# Patient Record
Sex: Female | Born: 1955
Health system: Southern US, Community
[De-identification: ages and names within clinical notes are randomized; demographics above are authoritative.]

## PROBLEM LIST (undated history)

## (undated) DIAGNOSIS — K635 Polyp of colon: Secondary | ICD-10-CM

## (undated) DIAGNOSIS — I82409 Acute embolism and thrombosis of unspecified deep veins of unspecified lower extremity: Secondary | ICD-10-CM

## (undated) DIAGNOSIS — Z923 Personal history of irradiation: Secondary | ICD-10-CM

## (undated) DIAGNOSIS — C50919 Malignant neoplasm of unspecified site of unspecified female breast: Secondary | ICD-10-CM

## (undated) DIAGNOSIS — N903 Dysplasia of vulva, unspecified: Secondary | ICD-10-CM

## (undated) DIAGNOSIS — G5600 Carpal tunnel syndrome, unspecified upper limb: Secondary | ICD-10-CM

## (undated) DIAGNOSIS — F419 Anxiety disorder, unspecified: Secondary | ICD-10-CM

## (undated) DIAGNOSIS — Z9889 Other specified postprocedural states: Secondary | ICD-10-CM

## (undated) DIAGNOSIS — R002 Palpitations: Secondary | ICD-10-CM

## (undated) DIAGNOSIS — I10 Essential (primary) hypertension: Secondary | ICD-10-CM

## (undated) DIAGNOSIS — I2510 Atherosclerotic heart disease of native coronary artery without angina pectoris: Secondary | ICD-10-CM

## (undated) DIAGNOSIS — F329 Major depressive disorder, single episode, unspecified: Secondary | ICD-10-CM

## (undated) DIAGNOSIS — F32A Depression, unspecified: Secondary | ICD-10-CM

## (undated) DIAGNOSIS — M1990 Unspecified osteoarthritis, unspecified site: Secondary | ICD-10-CM

## (undated) DIAGNOSIS — T4145XA Adverse effect of unspecified anesthetic, initial encounter: Secondary | ICD-10-CM

## (undated) DIAGNOSIS — C519 Malignant neoplasm of vulva, unspecified: Secondary | ICD-10-CM

## (undated) DIAGNOSIS — I350 Nonrheumatic aortic (valve) stenosis: Secondary | ICD-10-CM

## (undated) DIAGNOSIS — I509 Heart failure, unspecified: Secondary | ICD-10-CM

## (undated) DIAGNOSIS — T8859XA Other complications of anesthesia, initial encounter: Secondary | ICD-10-CM

## (undated) DIAGNOSIS — I779 Disorder of arteries and arterioles, unspecified: Secondary | ICD-10-CM

## (undated) HISTORY — DX: Malignant neoplasm of unspecified site of unspecified female breast: C50.919

## (undated) HISTORY — PX: TUBAL LIGATION: SHX77

## (undated) HISTORY — PX: INTRAUTERINE DEVICE INSERTION: SHX323

## (undated) HISTORY — DX: Major depressive disorder, single episode, unspecified: F32.9

## (undated) HISTORY — DX: Depression, unspecified: F32.A

## (undated) HISTORY — PX: DILATION AND CURETTAGE OF UTERUS: SHX78

## (undated) HISTORY — DX: Anxiety disorder, unspecified: F41.9

## (undated) HISTORY — PX: MASTECTOMY PARTIAL / LUMPECTOMY W/ AXILLARY LYMPHADENECTOMY: SUR852

## (undated) HISTORY — PX: CARDIAC CATHETERIZATION: SHX172

## (undated) HISTORY — DX: Personal history of irradiation: Z92.3

## (undated) HISTORY — PX: IUD REMOVAL: SHX5392

## (undated) HISTORY — PX: APPENDECTOMY: SHX54

## (undated) HISTORY — PX: VULVA SURGERY: SHX837

## (undated) HISTORY — DX: Other specified postprocedural states: Z98.890

## (undated) HISTORY — PX: COLONOSCOPY W/ POLYPECTOMY: SHX1380

## (undated) HISTORY — DX: Polyp of colon: K63.5

## (undated) HISTORY — DX: Essential (primary) hypertension: I10

---

## 1998-01-15 ENCOUNTER — Other Ambulatory Visit: Admission: RE | Admit: 1998-01-15 | Discharge: 1998-01-15 | Payer: Self-pay | Admitting: Gynecology

## 1999-09-22 ENCOUNTER — Other Ambulatory Visit: Admission: RE | Admit: 1999-09-22 | Discharge: 1999-09-22 | Payer: Self-pay | Admitting: Gynecology

## 2001-01-25 ENCOUNTER — Other Ambulatory Visit: Admission: RE | Admit: 2001-01-25 | Discharge: 2001-01-25 | Payer: Self-pay | Admitting: Gynecology

## 2002-06-27 ENCOUNTER — Other Ambulatory Visit: Admission: RE | Admit: 2002-06-27 | Discharge: 2002-06-27 | Payer: Self-pay | Admitting: Gynecology

## 2003-07-16 ENCOUNTER — Other Ambulatory Visit: Admission: RE | Admit: 2003-07-16 | Discharge: 2003-07-16 | Payer: Self-pay | Admitting: Gynecology

## 2004-09-11 ENCOUNTER — Other Ambulatory Visit: Admission: RE | Admit: 2004-09-11 | Discharge: 2004-09-11 | Payer: Self-pay | Admitting: Gynecology

## 2005-03-17 ENCOUNTER — Other Ambulatory Visit: Admission: RE | Admit: 2005-03-17 | Discharge: 2005-03-17 | Payer: Self-pay | Admitting: Gynecology

## 2005-09-02 ENCOUNTER — Other Ambulatory Visit: Admission: RE | Admit: 2005-09-02 | Discharge: 2005-09-02 | Payer: Self-pay | Admitting: Gynecology

## 2006-02-09 ENCOUNTER — Ambulatory Visit (HOSPITAL_COMMUNITY): Admission: RE | Admit: 2006-02-09 | Discharge: 2006-02-09 | Payer: Self-pay | Admitting: Gastroenterology

## 2006-05-05 ENCOUNTER — Other Ambulatory Visit: Admission: RE | Admit: 2006-05-05 | Discharge: 2006-05-05 | Payer: Self-pay | Admitting: Gynecology

## 2007-06-02 DIAGNOSIS — C50919 Malignant neoplasm of unspecified site of unspecified female breast: Secondary | ICD-10-CM

## 2007-06-02 HISTORY — DX: Malignant neoplasm of unspecified site of unspecified female breast: C50.919

## 2007-06-12 ENCOUNTER — Encounter: Admission: RE | Admit: 2007-06-12 | Discharge: 2007-06-12 | Payer: Self-pay | Admitting: General Surgery

## 2007-06-23 DIAGNOSIS — Z923 Personal history of irradiation: Secondary | ICD-10-CM

## 2007-06-23 HISTORY — DX: Personal history of irradiation: Z92.3

## 2007-07-04 ENCOUNTER — Ambulatory Visit (HOSPITAL_COMMUNITY): Admission: RE | Admit: 2007-07-04 | Discharge: 2007-07-04 | Payer: Self-pay | Admitting: General Surgery

## 2007-07-04 ENCOUNTER — Encounter (INDEPENDENT_AMBULATORY_CARE_PROVIDER_SITE_OTHER): Payer: Self-pay | Admitting: General Surgery

## 2007-07-04 DIAGNOSIS — Z9889 Other specified postprocedural states: Secondary | ICD-10-CM

## 2007-07-04 HISTORY — DX: Other specified postprocedural states: Z98.890

## 2007-07-11 ENCOUNTER — Ambulatory Visit: Payer: Self-pay | Admitting: Oncology

## 2007-07-14 LAB — CBC WITH DIFFERENTIAL/PLATELET
BASO%: 0.7 % (ref 0.0–2.0)
EOS%: 1.5 % (ref 0.0–7.0)
MCH: 31.3 pg (ref 26.0–34.0)
MCHC: 34.1 g/dL (ref 32.0–36.0)
MCV: 91.7 fL (ref 81.0–101.0)
MONO%: 5.1 % (ref 0.0–13.0)
RBC: 4.5 10*6/uL (ref 3.70–5.32)
RDW: 13 % (ref 11.3–14.5)
lymph#: 2.8 10*3/uL (ref 0.9–3.3)

## 2007-07-14 LAB — COMPREHENSIVE METABOLIC PANEL
ALT: 18 U/L (ref 0–35)
AST: 22 U/L (ref 0–37)
Albumin: 4.3 g/dL (ref 3.5–5.2)
Alkaline Phosphatase: 51 U/L (ref 39–117)
BUN: 14 mg/dL (ref 6–23)
Potassium: 3.5 mEq/L (ref 3.5–5.3)
Sodium: 138 mEq/L (ref 135–145)

## 2007-07-19 LAB — VITAMIN D PNL(25-HYDRXY+1,25-DIHY)-BLD: Vit D, 25-Hydroxy: 18 ng/mL — ABNORMAL LOW (ref 30–89)

## 2007-07-25 ENCOUNTER — Ambulatory Visit: Payer: Self-pay

## 2007-07-25 ENCOUNTER — Encounter: Payer: Self-pay | Admitting: Oncology

## 2007-08-02 ENCOUNTER — Ambulatory Visit (HOSPITAL_COMMUNITY): Admission: RE | Admit: 2007-08-02 | Discharge: 2007-08-02 | Payer: Self-pay | Admitting: Oncology

## 2007-08-03 ENCOUNTER — Ambulatory Visit: Payer: Self-pay | Admitting: Psychiatry

## 2007-08-05 ENCOUNTER — Ambulatory Visit (HOSPITAL_COMMUNITY): Admission: RE | Admit: 2007-08-05 | Discharge: 2007-08-05 | Payer: Self-pay | Admitting: General Surgery

## 2007-08-05 ENCOUNTER — Encounter (INDEPENDENT_AMBULATORY_CARE_PROVIDER_SITE_OTHER): Payer: Self-pay | Admitting: General Surgery

## 2007-08-11 ENCOUNTER — Ambulatory Visit: Payer: Self-pay | Admitting: Psychiatry

## 2007-08-17 ENCOUNTER — Ambulatory Visit: Payer: Self-pay | Admitting: Psychiatry

## 2007-08-18 LAB — CBC WITH DIFFERENTIAL/PLATELET
BASO%: 0.3 % (ref 0.0–2.0)
Basophils Absolute: 0 10*3/uL (ref 0.0–0.1)
EOS%: 0.8 % (ref 0.0–7.0)
HCT: 41 % (ref 34.8–46.6)
HGB: 14.6 g/dL (ref 11.6–15.9)
MCH: 32.2 pg (ref 26.0–34.0)
MCHC: 35.5 g/dL (ref 32.0–36.0)
MCV: 90.6 fL (ref 81.0–101.0)
MONO%: 4.6 % (ref 0.0–13.0)
NEUT%: 72.3 % (ref 39.6–76.8)

## 2007-08-24 ENCOUNTER — Ambulatory Visit: Payer: Self-pay | Admitting: Psychiatry

## 2007-08-30 ENCOUNTER — Ambulatory Visit: Payer: Self-pay | Admitting: Oncology

## 2007-08-31 ENCOUNTER — Ambulatory Visit: Payer: Self-pay | Admitting: Psychiatry

## 2007-09-07 ENCOUNTER — Ambulatory Visit: Payer: Self-pay | Admitting: Psychiatry

## 2007-09-08 ENCOUNTER — Ambulatory Visit: Payer: Self-pay | Admitting: Psychiatry

## 2007-09-12 ENCOUNTER — Ambulatory Visit: Payer: Self-pay | Admitting: Psychiatry

## 2007-09-15 ENCOUNTER — Ambulatory Visit: Payer: Self-pay | Admitting: Psychiatry

## 2007-09-21 ENCOUNTER — Ambulatory Visit: Payer: Self-pay | Admitting: Psychiatry

## 2007-09-22 ENCOUNTER — Ambulatory Visit: Payer: Self-pay | Admitting: Licensed Clinical Social Worker

## 2007-09-22 LAB — CBC WITH DIFFERENTIAL/PLATELET
BASO%: 0.2 % (ref 0.0–2.0)
EOS%: 1.6 % (ref 0.0–7.0)
HCT: 38.2 % (ref 34.8–46.6)
MCH: 32 pg (ref 26.0–34.0)
MCHC: 35.7 g/dL (ref 32.0–36.0)
MCV: 89.8 fL (ref 81.0–101.0)
MONO%: 3.5 % (ref 0.0–13.0)
NEUT%: 69.9 % (ref 39.6–76.8)
RDW: 12.9 % (ref 11.3–14.5)
lymph#: 2.9 10*3/uL (ref 0.9–3.3)

## 2007-09-27 ENCOUNTER — Ambulatory Visit: Payer: Self-pay | Admitting: Psychiatry

## 2007-09-29 ENCOUNTER — Ambulatory Visit: Payer: Self-pay | Admitting: Psychiatry

## 2007-10-04 ENCOUNTER — Ambulatory Visit: Payer: Self-pay | Admitting: Psychiatry

## 2007-10-06 ENCOUNTER — Ambulatory Visit: Payer: Self-pay | Admitting: Psychiatry

## 2007-10-06 LAB — CBC WITH DIFFERENTIAL/PLATELET
BASO%: 0.2 % (ref 0.0–2.0)
Basophils Absolute: 0 10*3/uL (ref 0.0–0.1)
EOS%: 0.2 % (ref 0.0–7.0)
HGB: 13.9 g/dL (ref 11.6–15.9)
MCH: 32.2 pg (ref 26.0–34.0)
MONO%: 1.1 % (ref 0.0–13.0)
RBC: 4.33 10*6/uL (ref 3.70–5.32)
RDW: 13.3 % (ref 11.3–14.5)
lymph#: 1 10*3/uL (ref 0.9–3.3)

## 2007-10-07 LAB — COMPREHENSIVE METABOLIC PANEL
ALT: 26 U/L (ref 0–35)
AST: 19 U/L (ref 0–37)
Albumin: 4.8 g/dL (ref 3.5–5.2)
Alkaline Phosphatase: 61 U/L (ref 39–117)
Calcium: 9.6 mg/dL (ref 8.4–10.5)
Chloride: 102 mEq/L (ref 96–112)
Potassium: 3.7 mEq/L (ref 3.5–5.3)
Sodium: 139 mEq/L (ref 135–145)
Total Protein: 7.7 g/dL (ref 6.0–8.3)

## 2007-10-11 ENCOUNTER — Ambulatory Visit: Payer: Self-pay | Admitting: Oncology

## 2007-10-12 ENCOUNTER — Ambulatory Visit: Payer: Self-pay | Admitting: Psychiatry

## 2007-10-13 LAB — CBC WITH DIFFERENTIAL/PLATELET
BASO%: 3.1 % — ABNORMAL HIGH (ref 0.0–2.0)
Basophils Absolute: 0.1 10*3/uL (ref 0.0–0.1)
EOS%: 7 % (ref 0.0–7.0)
MCH: 31.8 pg (ref 26.0–34.0)
MCHC: 36 g/dL (ref 32.0–36.0)
MCV: 88.2 fL (ref 81.0–101.0)
MONO%: 4 % (ref 0.0–13.0)
RDW: 12.6 % (ref 11.3–14.5)
lymph#: 1.2 10*3/uL (ref 0.9–3.3)

## 2007-10-18 ENCOUNTER — Ambulatory Visit: Payer: Self-pay | Admitting: Psychiatry

## 2007-10-20 ENCOUNTER — Ambulatory Visit: Payer: Self-pay | Admitting: Psychiatry

## 2007-10-24 ENCOUNTER — Ambulatory Visit: Payer: Self-pay | Admitting: Psychiatry

## 2007-10-27 ENCOUNTER — Ambulatory Visit: Payer: Self-pay | Admitting: Psychiatry

## 2007-10-28 LAB — CBC WITH DIFFERENTIAL/PLATELET
BASO%: 0.6 % (ref 0.0–2.0)
HCT: 38.7 % (ref 34.8–46.6)
MCHC: 35.7 g/dL (ref 32.0–36.0)
MONO#: 1.3 10*3/uL — ABNORMAL HIGH (ref 0.1–0.9)
NEUT%: 74.8 % (ref 39.6–76.8)
WBC: 19.3 10*3/uL — ABNORMAL HIGH (ref 3.9–10.0)
lymph#: 3.4 10*3/uL — ABNORMAL HIGH (ref 0.9–3.3)

## 2007-10-28 LAB — COMPREHENSIVE METABOLIC PANEL
ALT: 35 U/L (ref 0–35)
Albumin: 4.8 g/dL (ref 3.5–5.2)
CO2: 24 mEq/L (ref 19–32)
Calcium: 10.3 mg/dL (ref 8.4–10.5)
Chloride: 101 mEq/L (ref 96–112)
Potassium: 3.5 mEq/L (ref 3.5–5.3)
Sodium: 138 mEq/L (ref 135–145)
Total Protein: 7.6 g/dL (ref 6.0–8.3)

## 2007-10-28 LAB — LACTATE DEHYDROGENASE: LDH: 294 U/L — ABNORMAL HIGH (ref 94–250)

## 2007-11-01 ENCOUNTER — Ambulatory Visit: Payer: Self-pay | Admitting: Psychiatry

## 2007-11-03 ENCOUNTER — Ambulatory Visit: Payer: Self-pay | Admitting: Psychiatry

## 2007-11-04 LAB — CBC WITH DIFFERENTIAL/PLATELET
BASO%: 1.7 % (ref 0.0–2.0)
EOS%: 2.1 % (ref 0.0–7.0)
MCH: 31.6 pg (ref 26.0–34.0)
MCHC: 35.7 g/dL (ref 32.0–36.0)
MONO%: 6.1 % (ref 0.0–13.0)
RDW: 14.1 % (ref 11.3–14.5)
lymph#: 1.7 10*3/uL (ref 0.9–3.3)

## 2007-11-08 ENCOUNTER — Ambulatory Visit: Payer: Self-pay | Admitting: Psychiatry

## 2007-11-10 ENCOUNTER — Ambulatory Visit: Payer: Self-pay | Admitting: Psychiatry

## 2007-11-15 ENCOUNTER — Ambulatory Visit: Payer: Self-pay | Admitting: Psychiatry

## 2007-11-17 ENCOUNTER — Ambulatory Visit: Payer: Self-pay | Admitting: Psychiatry

## 2007-11-18 LAB — COMPREHENSIVE METABOLIC PANEL
ALT: 47 U/L — ABNORMAL HIGH (ref 0–35)
AST: 32 U/L (ref 0–37)
Albumin: 4.8 g/dL (ref 3.5–5.2)
Alkaline Phosphatase: 48 U/L (ref 39–117)
Calcium: 9.9 mg/dL (ref 8.4–10.5)
Chloride: 103 mEq/L (ref 96–112)
Creatinine, Ser: 0.68 mg/dL (ref 0.40–1.20)
Potassium: 3.5 mEq/L (ref 3.5–5.3)

## 2007-11-18 LAB — CBC WITH DIFFERENTIAL/PLATELET
BASO%: 0.5 % (ref 0.0–2.0)
Basophils Absolute: 0.1 10*3/uL (ref 0.0–0.1)
EOS%: 0 % (ref 0.0–7.0)
HCT: 38.9 % (ref 34.8–46.6)
LYMPH%: 12.6 % — ABNORMAL LOW (ref 14.0–48.0)
MCH: 31.3 pg (ref 26.0–34.0)
MCHC: 35.1 g/dL (ref 32.0–36.0)
NEUT%: 82 % — ABNORMAL HIGH (ref 39.6–76.8)
Platelets: 459 10*3/uL — ABNORMAL HIGH (ref 145–400)

## 2007-11-22 ENCOUNTER — Ambulatory Visit: Payer: Self-pay | Admitting: Psychiatry

## 2007-11-23 ENCOUNTER — Ambulatory Visit: Payer: Self-pay | Admitting: Oncology

## 2007-11-24 ENCOUNTER — Ambulatory Visit: Payer: Self-pay | Admitting: Psychiatry

## 2007-11-25 LAB — CBC WITH DIFFERENTIAL/PLATELET
Basophils Absolute: 0.1 10*3/uL (ref 0.0–0.1)
EOS%: 1.7 % (ref 0.0–7.0)
Eosinophils Absolute: 0 10*3/uL (ref 0.0–0.5)
HCT: 35.4 % (ref 34.8–46.6)
HGB: 12.8 g/dL (ref 11.6–15.9)
LYMPH%: 50.9 % — ABNORMAL HIGH (ref 14.0–48.0)
MCH: 31.8 pg (ref 26.0–34.0)
MCV: 87.6 fL (ref 81.0–101.0)
MONO%: 8.4 % (ref 0.0–13.0)
NEUT#: 0.9 10*3/uL — ABNORMAL LOW (ref 1.5–6.5)
NEUT%: 36.3 % — ABNORMAL LOW (ref 39.6–76.8)
Platelets: 310 10*3/uL (ref 145–400)

## 2007-11-29 ENCOUNTER — Ambulatory Visit: Payer: Self-pay | Admitting: Psychiatry

## 2007-11-29 ENCOUNTER — Ambulatory Visit: Admission: RE | Admit: 2007-11-29 | Discharge: 2008-02-27 | Payer: Self-pay | Admitting: Radiation Oncology

## 2007-11-30 LAB — CBC WITH DIFFERENTIAL/PLATELET
BASO%: 1.3 % (ref 0.0–2.0)
LYMPH%: 45.7 % (ref 14.0–48.0)
MCHC: 35.3 g/dL (ref 32.0–36.0)
MONO#: 0.7 10*3/uL (ref 0.1–0.9)
Platelets: 413 10*3/uL — ABNORMAL HIGH (ref 145–400)
RBC: 4.16 10*6/uL (ref 3.70–5.32)
WBC: 2.8 10*3/uL — ABNORMAL LOW (ref 3.9–10.0)
lymph#: 1.3 10*3/uL (ref 0.9–3.3)

## 2007-12-01 ENCOUNTER — Ambulatory Visit: Payer: Self-pay | Admitting: Psychiatry

## 2007-12-06 ENCOUNTER — Ambulatory Visit: Payer: Self-pay | Admitting: Psychiatry

## 2007-12-08 ENCOUNTER — Ambulatory Visit: Payer: Self-pay | Admitting: Psychiatry

## 2007-12-13 ENCOUNTER — Ambulatory Visit: Payer: Self-pay | Admitting: Psychiatry

## 2007-12-15 ENCOUNTER — Ambulatory Visit: Payer: Self-pay | Admitting: Psychiatry

## 2007-12-19 ENCOUNTER — Ambulatory Visit: Payer: Self-pay | Admitting: Psychiatry

## 2007-12-21 ENCOUNTER — Ambulatory Visit: Payer: Self-pay | Admitting: Psychiatry

## 2007-12-29 ENCOUNTER — Ambulatory Visit: Payer: Self-pay | Admitting: Psychiatry

## 2008-01-03 ENCOUNTER — Ambulatory Visit: Payer: Self-pay | Admitting: Psychiatry

## 2008-01-05 ENCOUNTER — Ambulatory Visit: Payer: Self-pay | Admitting: Psychiatry

## 2008-01-11 ENCOUNTER — Ambulatory Visit: Payer: Self-pay | Admitting: Psychiatry

## 2008-01-18 ENCOUNTER — Ambulatory Visit: Payer: Self-pay | Admitting: Psychiatry

## 2008-01-23 ENCOUNTER — Ambulatory Visit: Payer: Self-pay | Admitting: Psychiatry

## 2008-01-25 ENCOUNTER — Ambulatory Visit: Payer: Self-pay | Admitting: Psychiatry

## 2008-01-27 ENCOUNTER — Ambulatory Visit: Payer: Self-pay | Admitting: Oncology

## 2008-02-01 ENCOUNTER — Ambulatory Visit: Payer: Self-pay | Admitting: Psychiatry

## 2008-02-08 ENCOUNTER — Ambulatory Visit: Payer: Self-pay | Admitting: Psychiatry

## 2008-02-15 ENCOUNTER — Ambulatory Visit: Payer: Self-pay | Admitting: Psychiatry

## 2008-02-21 ENCOUNTER — Ambulatory Visit: Payer: Self-pay | Admitting: Psychiatry

## 2008-02-29 ENCOUNTER — Ambulatory Visit: Payer: Self-pay | Admitting: Psychiatry

## 2008-03-05 ENCOUNTER — Ambulatory Visit: Payer: Self-pay | Admitting: Psychiatry

## 2008-03-12 ENCOUNTER — Ambulatory Visit: Payer: Self-pay | Admitting: Psychiatry

## 2008-03-12 ENCOUNTER — Ambulatory Visit: Payer: Self-pay | Admitting: Oncology

## 2008-03-19 ENCOUNTER — Ambulatory Visit: Payer: Self-pay | Admitting: Psychiatry

## 2008-03-28 ENCOUNTER — Ambulatory Visit: Payer: Self-pay | Admitting: Psychiatry

## 2008-04-03 ENCOUNTER — Ambulatory Visit: Payer: Self-pay | Admitting: Psychiatry

## 2008-04-09 ENCOUNTER — Ambulatory Visit: Payer: Self-pay | Admitting: Psychiatry

## 2008-04-16 ENCOUNTER — Ambulatory Visit: Payer: Self-pay | Admitting: Psychiatry

## 2008-04-19 ENCOUNTER — Ambulatory Visit: Payer: Self-pay | Admitting: Psychiatry

## 2008-04-23 ENCOUNTER — Ambulatory Visit: Payer: Self-pay | Admitting: Psychiatry

## 2008-04-30 ENCOUNTER — Ambulatory Visit: Payer: Self-pay | Admitting: Psychiatry

## 2008-05-07 ENCOUNTER — Ambulatory Visit: Admission: RE | Admit: 2008-05-07 | Discharge: 2008-05-11 | Payer: Self-pay | Admitting: Radiation Oncology

## 2008-05-07 ENCOUNTER — Ambulatory Visit: Payer: Self-pay | Admitting: Psychiatry

## 2008-05-09 ENCOUNTER — Ambulatory Visit: Payer: Self-pay | Admitting: Oncology

## 2008-05-11 LAB — COMPREHENSIVE METABOLIC PANEL
AST: 16 U/L (ref 0–37)
Albumin: 4.4 g/dL (ref 3.5–5.2)
Alkaline Phosphatase: 55 U/L (ref 39–117)
BUN: 13 mg/dL (ref 6–23)
Potassium: 3.7 mEq/L (ref 3.5–5.3)
Total Bilirubin: 0.6 mg/dL (ref 0.3–1.2)

## 2008-05-11 LAB — CBC WITH DIFFERENTIAL/PLATELET
Basophils Absolute: 0 10*3/uL (ref 0.0–0.1)
EOS%: 1.5 % (ref 0.0–7.0)
HGB: 14.4 g/dL (ref 11.6–15.9)
LYMPH%: 22.9 % (ref 14.0–48.0)
MCH: 32.7 pg (ref 26.0–34.0)
MCV: 92.6 fL (ref 81.0–101.0)
MONO%: 7.6 % (ref 0.0–13.0)
Platelets: 325 10*3/uL (ref 145–400)
RBC: 4.4 10*6/uL (ref 3.70–5.32)
RDW: 13 % (ref 11.3–14.5)

## 2008-05-14 ENCOUNTER — Ambulatory Visit: Payer: Self-pay | Admitting: Psychiatry

## 2008-05-22 ENCOUNTER — Ambulatory Visit: Payer: Self-pay | Admitting: Psychiatry

## 2008-06-05 ENCOUNTER — Ambulatory Visit: Payer: Self-pay | Admitting: Psychiatry

## 2008-06-07 ENCOUNTER — Ambulatory Visit: Payer: Self-pay | Admitting: Psychiatry

## 2008-06-21 ENCOUNTER — Ambulatory Visit: Payer: Self-pay | Admitting: Psychiatry

## 2008-06-25 ENCOUNTER — Ambulatory Visit: Payer: Self-pay | Admitting: Psychiatry

## 2008-06-27 ENCOUNTER — Other Ambulatory Visit: Admission: RE | Admit: 2008-06-27 | Discharge: 2008-06-27 | Payer: Self-pay | Admitting: Gynecology

## 2008-07-02 ENCOUNTER — Ambulatory Visit: Payer: Self-pay | Admitting: Psychiatry

## 2008-07-05 ENCOUNTER — Ambulatory Visit: Payer: Self-pay | Admitting: Psychiatry

## 2008-07-09 ENCOUNTER — Ambulatory Visit: Payer: Self-pay | Admitting: Psychiatry

## 2008-07-16 ENCOUNTER — Ambulatory Visit: Payer: Self-pay | Admitting: Psychiatry

## 2008-07-25 ENCOUNTER — Ambulatory Visit: Payer: Self-pay | Admitting: Psychiatry

## 2008-07-31 ENCOUNTER — Ambulatory Visit: Payer: Self-pay | Admitting: Psychiatry

## 2008-08-02 ENCOUNTER — Ambulatory Visit: Payer: Self-pay | Admitting: Psychiatry

## 2008-08-08 ENCOUNTER — Ambulatory Visit: Payer: Self-pay | Admitting: Psychiatry

## 2008-08-15 ENCOUNTER — Ambulatory Visit: Payer: Self-pay | Admitting: Psychiatry

## 2008-08-15 ENCOUNTER — Inpatient Hospital Stay (HOSPITAL_COMMUNITY): Admission: EM | Admit: 2008-08-15 | Discharge: 2008-08-21 | Payer: Self-pay | Admitting: Family Medicine

## 2008-08-15 ENCOUNTER — Ambulatory Visit: Payer: Self-pay | Admitting: Diagnostic Radiology

## 2008-08-15 ENCOUNTER — Encounter: Payer: Self-pay | Admitting: Emergency Medicine

## 2008-08-15 ENCOUNTER — Ambulatory Visit: Payer: Self-pay | Admitting: Family Medicine

## 2008-08-16 ENCOUNTER — Ambulatory Visit: Payer: Self-pay | Admitting: Infectious Diseases

## 2008-08-21 ENCOUNTER — Encounter: Admission: RE | Admit: 2008-08-21 | Discharge: 2008-08-21 | Payer: Self-pay | Admitting: General Surgery

## 2008-08-29 ENCOUNTER — Ambulatory Visit: Payer: Self-pay | Admitting: Psychiatry

## 2008-08-31 ENCOUNTER — Ambulatory Visit: Payer: Self-pay | Admitting: Oncology

## 2008-09-13 ENCOUNTER — Ambulatory Visit: Payer: Self-pay | Admitting: Psychiatry

## 2008-09-18 LAB — CBC WITH DIFFERENTIAL/PLATELET
Eosinophils Absolute: 0.1 10*3/uL (ref 0.0–0.5)
MCV: 91.5 fL (ref 79.5–101.0)
MONO%: 6.4 % (ref 0.0–14.0)
NEUT#: 3.9 10*3/uL (ref 1.5–6.5)
RBC: 4.81 10*6/uL (ref 3.70–5.45)
RDW: 13.1 % (ref 11.2–14.5)
WBC: 6.1 10*3/uL (ref 3.9–10.3)
lymph#: 1.7 10*3/uL (ref 0.9–3.3)

## 2008-09-19 LAB — CANCER ANTIGEN 27.29: CA 27.29: 26 U/mL (ref 0–39)

## 2008-09-19 LAB — COMPREHENSIVE METABOLIC PANEL
AST: 18 U/L (ref 0–37)
Albumin: 4.8 g/dL (ref 3.5–5.2)
Alkaline Phosphatase: 56 U/L (ref 39–117)
Glucose, Bld: 103 mg/dL — ABNORMAL HIGH (ref 70–99)
Potassium: 3.6 mEq/L (ref 3.5–5.3)
Sodium: 138 mEq/L (ref 135–145)
Total Protein: 7.7 g/dL (ref 6.0–8.3)

## 2008-09-20 ENCOUNTER — Ambulatory Visit: Payer: Self-pay | Admitting: Psychiatry

## 2008-09-24 ENCOUNTER — Encounter: Admission: RE | Admit: 2008-09-24 | Discharge: 2008-09-24 | Payer: Self-pay | Admitting: General Surgery

## 2008-09-25 ENCOUNTER — Ambulatory Visit: Payer: Self-pay | Admitting: Psychiatry

## 2008-10-04 ENCOUNTER — Ambulatory Visit: Payer: Self-pay | Admitting: Psychiatry

## 2008-10-09 ENCOUNTER — Ambulatory Visit: Payer: Self-pay | Admitting: Psychiatry

## 2008-10-17 ENCOUNTER — Ambulatory Visit: Payer: Self-pay | Admitting: Psychiatry

## 2008-10-22 ENCOUNTER — Ambulatory Visit: Payer: Self-pay | Admitting: Psychiatry

## 2008-10-30 ENCOUNTER — Ambulatory Visit: Payer: Self-pay | Admitting: Psychiatry

## 2008-11-06 ENCOUNTER — Ambulatory Visit: Payer: Self-pay | Admitting: Psychiatry

## 2008-11-27 ENCOUNTER — Ambulatory Visit: Payer: Self-pay | Admitting: Psychiatry

## 2008-12-04 ENCOUNTER — Ambulatory Visit: Payer: Self-pay | Admitting: Psychiatry

## 2008-12-20 ENCOUNTER — Ambulatory Visit: Payer: Self-pay | Admitting: Psychiatry

## 2009-01-01 ENCOUNTER — Ambulatory Visit: Payer: Self-pay | Admitting: Psychiatry

## 2009-01-10 ENCOUNTER — Ambulatory Visit: Payer: Self-pay | Admitting: Psychiatry

## 2009-01-10 ENCOUNTER — Ambulatory Visit: Payer: Self-pay | Admitting: Oncology

## 2009-01-15 LAB — CBC WITH DIFFERENTIAL/PLATELET
Basophils Absolute: 0 10*3/uL (ref 0.0–0.1)
EOS%: 1 % (ref 0.0–7.0)
Eosinophils Absolute: 0.1 10*3/uL (ref 0.0–0.5)
HGB: 14.3 g/dL (ref 11.6–15.9)
MCH: 32.6 pg (ref 25.1–34.0)
MONO#: 0.4 10*3/uL (ref 0.1–0.9)
NEUT#: 3.1 10*3/uL (ref 1.5–6.5)
RDW: 12.8 % (ref 11.2–14.5)
WBC: 5.4 10*3/uL (ref 3.9–10.3)
lymph#: 1.8 10*3/uL (ref 0.9–3.3)

## 2009-01-16 LAB — CANCER ANTIGEN 27.29: CA 27.29: 21 U/mL (ref 0–39)

## 2009-01-16 LAB — COMPREHENSIVE METABOLIC PANEL
AST: 14 U/L (ref 0–37)
Albumin: 4.5 g/dL (ref 3.5–5.2)
BUN: 8 mg/dL (ref 6–23)
CO2: 27 mEq/L (ref 19–32)
Calcium: 9.9 mg/dL (ref 8.4–10.5)
Chloride: 104 mEq/L (ref 96–112)
Potassium: 4 mEq/L (ref 3.5–5.3)

## 2009-01-16 LAB — VITAMIN D 25 HYDROXY (VIT D DEFICIENCY, FRACTURES): Vit D, 25-Hydroxy: 35 ng/mL (ref 30–89)

## 2009-01-16 LAB — LACTATE DEHYDROGENASE: LDH: 152 U/L (ref 94–250)

## 2009-01-17 ENCOUNTER — Ambulatory Visit: Payer: Self-pay | Admitting: Psychiatry

## 2009-01-23 ENCOUNTER — Ambulatory Visit: Payer: Self-pay | Admitting: Psychiatry

## 2009-02-04 ENCOUNTER — Ambulatory Visit: Payer: Self-pay | Admitting: Psychiatry

## 2009-02-14 ENCOUNTER — Ambulatory Visit: Payer: Self-pay | Admitting: Psychiatry

## 2009-03-05 ENCOUNTER — Ambulatory Visit: Payer: Self-pay | Admitting: Psychiatry

## 2009-03-10 ENCOUNTER — Emergency Department (HOSPITAL_BASED_OUTPATIENT_CLINIC_OR_DEPARTMENT_OTHER): Admission: EM | Admit: 2009-03-10 | Discharge: 2009-03-10 | Payer: Self-pay | Admitting: Emergency Medicine

## 2009-03-10 ENCOUNTER — Ambulatory Visit: Payer: Self-pay | Admitting: Radiology

## 2009-04-02 ENCOUNTER — Ambulatory Visit: Payer: Self-pay | Admitting: Psychiatry

## 2009-04-16 ENCOUNTER — Ambulatory Visit: Payer: Self-pay | Admitting: Psychiatry

## 2009-04-30 ENCOUNTER — Ambulatory Visit: Payer: Self-pay | Admitting: Psychiatry

## 2009-05-14 ENCOUNTER — Ambulatory Visit: Payer: Self-pay | Admitting: Psychiatry

## 2009-05-14 ENCOUNTER — Ambulatory Visit: Payer: Self-pay | Admitting: Oncology

## 2009-05-28 ENCOUNTER — Ambulatory Visit: Payer: Self-pay | Admitting: Psychiatry

## 2009-06-04 ENCOUNTER — Ambulatory Visit: Payer: Self-pay | Admitting: Psychiatry

## 2009-06-11 ENCOUNTER — Ambulatory Visit: Payer: Self-pay | Admitting: Psychiatry

## 2009-06-17 ENCOUNTER — Ambulatory Visit: Payer: Self-pay | Admitting: Oncology

## 2009-06-19 LAB — COMPREHENSIVE METABOLIC PANEL
ALT: 16 U/L (ref 0–35)
AST: 17 U/L (ref 0–37)
Albumin: 4.3 g/dL (ref 3.5–5.2)
Alkaline Phosphatase: 53 U/L (ref 39–117)
BUN: 13 mg/dL (ref 6–23)
CO2: 22 mEq/L (ref 19–32)
Calcium: 9 mg/dL (ref 8.4–10.5)
Chloride: 101 mEq/L (ref 96–112)
Creatinine, Ser: 0.88 mg/dL (ref 0.40–1.20)
Glucose, Bld: 151 mg/dL — ABNORMAL HIGH (ref 70–99)
Potassium: 3.9 mEq/L (ref 3.5–5.3)
Sodium: 138 mEq/L (ref 135–145)
Total Bilirubin: 0.5 mg/dL (ref 0.3–1.2)
Total Protein: 6.6 g/dL (ref 6.0–8.3)

## 2009-06-19 LAB — CBC WITH DIFFERENTIAL/PLATELET
Basophils Absolute: 0 10*3/uL (ref 0.0–0.1)
EOS%: 2.2 % (ref 0.0–7.0)
HCT: 42.4 % (ref 34.8–46.6)
HGB: 14.7 g/dL (ref 11.6–15.9)
MCH: 32.9 pg (ref 25.1–34.0)
MCV: 94.9 fL (ref 79.5–101.0)
NEUT%: 63.4 % (ref 38.4–76.8)
Platelets: 352 10*3/uL (ref 145–400)
lymph#: 1.7 10*3/uL (ref 0.9–3.3)

## 2009-06-19 LAB — CANCER ANTIGEN 27.29: CA 27.29: 14 U/mL (ref 0–39)

## 2009-06-19 LAB — VITAMIN D 25 HYDROXY (VIT D DEFICIENCY, FRACTURES): Vit D, 25-Hydroxy: 36 ng/mL (ref 30–89)

## 2009-06-27 ENCOUNTER — Ambulatory Visit: Payer: Self-pay | Admitting: Psychiatry

## 2009-07-23 ENCOUNTER — Ambulatory Visit: Payer: Self-pay | Admitting: Psychiatry

## 2009-08-07 ENCOUNTER — Ambulatory Visit: Payer: Self-pay | Admitting: Psychiatry

## 2009-08-22 ENCOUNTER — Ambulatory Visit: Payer: Self-pay | Admitting: Psychiatry

## 2009-08-26 ENCOUNTER — Ambulatory Visit: Payer: Self-pay | Admitting: Psychiatry

## 2009-09-09 ENCOUNTER — Ambulatory Visit: Payer: Self-pay | Admitting: Psychiatry

## 2009-09-16 ENCOUNTER — Ambulatory Visit: Payer: Self-pay | Admitting: Psychiatry

## 2009-10-02 ENCOUNTER — Ambulatory Visit: Payer: Self-pay | Admitting: Psychiatry

## 2009-10-31 ENCOUNTER — Ambulatory Visit: Payer: Self-pay | Admitting: Psychiatry

## 2009-11-11 ENCOUNTER — Ambulatory Visit: Payer: Self-pay | Admitting: Psychiatry

## 2009-11-25 ENCOUNTER — Ambulatory Visit: Payer: Self-pay | Admitting: Psychiatry

## 2009-12-09 ENCOUNTER — Ambulatory Visit: Payer: Self-pay | Admitting: Psychiatry

## 2009-12-16 ENCOUNTER — Ambulatory Visit: Payer: Self-pay | Admitting: Oncology

## 2009-12-17 LAB — CBC WITH DIFFERENTIAL/PLATELET
BASO%: 0.2 % (ref 0.0–2.0)
EOS%: 1.8 % (ref 0.0–7.0)
LYMPH%: 25.2 % (ref 14.0–49.7)
MCH: 32.7 pg (ref 25.1–34.0)
MCHC: 35.1 g/dL (ref 31.5–36.0)
MCV: 93.1 fL (ref 79.5–101.0)
MONO%: 7.1 % (ref 0.0–14.0)
Platelets: 350 10*3/uL (ref 145–400)
RBC: 4.52 10*6/uL (ref 3.70–5.45)
RDW: 13 % (ref 11.2–14.5)

## 2009-12-18 LAB — CANCER ANTIGEN 27.29: CA 27.29: 21 U/mL (ref 0–39)

## 2009-12-18 LAB — COMPREHENSIVE METABOLIC PANEL
ALT: 12 U/L (ref 0–35)
AST: 16 U/L (ref 0–37)
Alkaline Phosphatase: 51 U/L (ref 39–117)
Glucose, Bld: 97 mg/dL (ref 70–99)
Sodium: 139 mEq/L (ref 135–145)
Total Bilirubin: 0.5 mg/dL (ref 0.3–1.2)
Total Protein: 7 g/dL (ref 6.0–8.3)

## 2009-12-30 ENCOUNTER — Ambulatory Visit: Payer: Self-pay | Admitting: Psychiatry

## 2010-02-05 ENCOUNTER — Ambulatory Visit: Payer: Self-pay | Admitting: Psychiatry

## 2010-02-17 ENCOUNTER — Ambulatory Visit: Payer: Self-pay | Admitting: Psychiatry

## 2010-03-11 ENCOUNTER — Ambulatory Visit: Payer: Self-pay | Admitting: Psychiatry

## 2010-03-13 ENCOUNTER — Ambulatory Visit: Payer: Self-pay | Admitting: Psychiatry

## 2010-03-25 ENCOUNTER — Ambulatory Visit: Payer: Self-pay | Admitting: Psychiatry

## 2010-04-21 ENCOUNTER — Ambulatory Visit: Payer: Self-pay | Admitting: Psychiatry

## 2010-05-05 ENCOUNTER — Ambulatory Visit: Payer: Self-pay | Admitting: Psychiatry

## 2010-05-20 ENCOUNTER — Ambulatory Visit: Payer: Self-pay | Admitting: Psychiatry

## 2010-05-28 ENCOUNTER — Ambulatory Visit: Payer: Self-pay | Admitting: Psychiatry

## 2010-06-03 ENCOUNTER — Ambulatory Visit: Payer: Self-pay | Admitting: Psychiatry

## 2010-06-10 ENCOUNTER — Ambulatory Visit: Payer: Self-pay | Admitting: Psychiatry

## 2010-06-19 ENCOUNTER — Ambulatory Visit: Payer: Self-pay | Admitting: Oncology

## 2010-06-26 LAB — CBC WITH DIFFERENTIAL/PLATELET
BASO%: 1 % (ref 0.0–2.0)
Basophils Absolute: 0.1 10*3/uL (ref 0.0–0.1)
EOS%: 0.7 % (ref 0.0–7.0)
Eosinophils Absolute: 0 10*3/uL (ref 0.0–0.5)
HCT: 41.7 % (ref 34.8–46.6)
HGB: 14.6 g/dL (ref 11.6–15.9)
LYMPH%: 18.8 % (ref 14.0–49.7)
MCH: 32.5 pg (ref 25.1–34.0)
MCHC: 35 g/dL (ref 31.5–36.0)
MCV: 93 fL (ref 79.5–101.0)
MONO#: 0.4 10*3/uL (ref 0.1–0.9)
MONO%: 6.5 % (ref 0.0–14.0)
NEUT#: 4.1 10*3/uL (ref 1.5–6.5)
NEUT%: 73 % (ref 38.4–76.8)
Platelets: 378 10*3/uL (ref 145–400)
RBC: 4.48 10*6/uL (ref 3.70–5.45)
RDW: 13.1 % (ref 11.2–14.5)
WBC: 5.6 10*3/uL (ref 3.9–10.3)
lymph#: 1 10*3/uL (ref 0.9–3.3)

## 2010-06-27 LAB — CANCER ANTIGEN 27.29: CA 27.29: 27 U/mL (ref 0–39)

## 2010-06-27 LAB — COMPREHENSIVE METABOLIC PANEL
ALT: 14 U/L (ref 0–35)
AST: 18 U/L (ref 0–37)
Albumin: 4.5 g/dL (ref 3.5–5.2)
Alkaline Phosphatase: 54 U/L (ref 39–117)
BUN: 10 mg/dL (ref 6–23)
CO2: 22 mEq/L (ref 19–32)
Calcium: 9.5 mg/dL (ref 8.4–10.5)
Chloride: 101 mEq/L (ref 96–112)
Creatinine, Ser: 0.76 mg/dL (ref 0.40–1.20)
Glucose, Bld: 109 mg/dL — ABNORMAL HIGH (ref 70–99)
Potassium: 3.5 mEq/L (ref 3.5–5.3)
Sodium: 139 mEq/L (ref 135–145)
Total Bilirubin: 0.6 mg/dL (ref 0.3–1.2)
Total Protein: 7 g/dL (ref 6.0–8.3)

## 2010-06-27 LAB — VITAMIN D 25 HYDROXY (VIT D DEFICIENCY, FRACTURES): Vit D, 25-Hydroxy: 33 ng/mL (ref 30–89)

## 2010-06-27 LAB — LACTATE DEHYDROGENASE: LDH: 144 U/L (ref 94–250)

## 2010-06-30 ENCOUNTER — Ambulatory Visit: Admit: 2010-06-30 | Payer: Self-pay | Admitting: Psychiatry

## 2010-07-13 ENCOUNTER — Encounter: Payer: Self-pay | Admitting: General Surgery

## 2010-07-14 ENCOUNTER — Encounter: Payer: Self-pay | Admitting: Oncology

## 2010-07-17 ENCOUNTER — Inpatient Hospital Stay (HOSPITAL_COMMUNITY): Admission: EM | Admit: 2010-07-17 | Discharge: 2010-07-21 | Attending: Family Medicine | Admitting: Family Medicine

## 2010-07-17 ENCOUNTER — Emergency Department (HOSPITAL_BASED_OUTPATIENT_CLINIC_OR_DEPARTMENT_OTHER)
Admission: EM | Admit: 2010-07-17 | Discharge: 2010-07-17 | Disposition: A | Discharge status: Other Acute Inpatient Hospital | Payer: Self-pay | Source: Home / Self Care | Admitting: Emergency Medicine

## 2010-07-17 DIAGNOSIS — L0291 Cutaneous abscess, unspecified: Secondary | ICD-10-CM

## 2010-07-17 DIAGNOSIS — L0390 Cellulitis, unspecified: Secondary | ICD-10-CM

## 2010-07-17 DIAGNOSIS — I10 Essential (primary) hypertension: Secondary | ICD-10-CM

## 2010-07-17 DIAGNOSIS — E785 Hyperlipidemia, unspecified: Secondary | ICD-10-CM

## 2010-07-17 LAB — DIFFERENTIAL
Eosinophils Relative: 0 % (ref 0–5)
Lymphocytes Relative: 5 % — ABNORMAL LOW (ref 12–46)
Lymphs Abs: 0.9 10*3/uL (ref 0.7–4.0)
Monocytes Absolute: 0.7 10*3/uL (ref 0.1–1.0)

## 2010-07-17 LAB — BASIC METABOLIC PANEL
BUN: 13 mg/dL (ref 6–23)
Calcium: 9.6 mg/dL (ref 8.4–10.5)
Creatinine, Ser: 0.7 mg/dL (ref 0.4–1.2)
GFR calc non Af Amer: >60 mL/min (ref >60)
Glucose, Bld: 123 mg/dL — ABNORMAL HIGH (ref 70–99)

## 2010-07-17 LAB — CBC
HCT: 41.1 % (ref 36.0–46.0)
MCHC: 35.3 g/dL (ref 30.0–36.0)
MCV: 88.2 fL (ref 78.0–100.0)
RDW: 13 % (ref 11.5–15.5)

## 2010-07-18 LAB — CBC
MCV: 90.8 fL (ref 78.0–100.0)
WBC: 6.4 10*3/uL (ref 4.0–10.5)

## 2010-07-18 LAB — BASIC METABOLIC PANEL
BUN: 6 mg/dL (ref 6–23)
Creatinine, Ser: 0.78 mg/dL (ref 0.4–1.2)
GFR calc non Af Amer: >60 mL/min (ref >60)

## 2010-07-19 LAB — BASIC METABOLIC PANEL
BUN: 10 mg/dL (ref 6–23)
CO2: 26 mEq/L (ref 19–32)
Calcium: 8.8 mg/dL (ref 8.4–10.5)
Chloride: 101 mEq/L (ref 96–112)
Creatinine, Ser: 0.69 mg/dL (ref 0.4–1.2)

## 2010-07-19 LAB — CBC
Hemoglobin: 12.9 g/dL (ref 12.0–15.0)
MCH: 31.4 pg (ref 26.0–34.0)
MCHC: 34.3 g/dL (ref 30.0–36.0)
MCV: 91.5 fL (ref 78.0–100.0)
RBC: 4.11 MIL/uL (ref 3.87–5.11)

## 2010-07-23 LAB — CULTURE, BLOOD (ROUTINE X 2)
Culture  Setup Time: 201201260551
Culture: NO GROWTH
Culture: NO GROWTH

## 2010-08-01 NOTE — Discharge Summary (Signed)
[place], [place] West             ACCOUNT NO.:  [id number]  MEDICAL RECORD NO.:  [id number]          PATIENT TYPE:  INP  LOCATION:  3003                         FACILITY:  [facility]  PHYSICIAN:  Nestor Ramp, MD        DATE OF BIRTH:  January 24, 1956  DATE OF ADMISSION:  07/17/2010 DATE OF DISCHARGE:  07/21/2010                              DISCHARGE SUMMARY   PRIMARY CARE PHYSICIAN:  [name] [name], MD at [facility].  DISCHARGE DIAGNOSES: 1. Right breast cellulitis. 2. Hypertension. 3. History of right breast cancer status post lumpectomy, lymph node     dissection, chemotherapy and radiation in 2009.  DISCHARGE MEDICATIONS: 1. Tylenol 325 mg 1-2 tablets p.o. q.6 h. p.r.n. pain. 2. Clindamycin 300 mg p.o. t.i.d. x14-day total course. 3. Colace 100 mg p.o. b.i.d. 4. Doxycycline 100 mg p.o. b.i.d. for 14-day course. 5. Oxycodone/acetaminophen 5/325 one to two tablets p.o. q.6 h. p.r.n. 6. Calcium carbonate/vitamin D. 7. Femara 2.5 mg p.o. daily. 8. Hydrochlorothiazide 25 mg p.o. daily. 9. Xanax 1 mg p.o. t.i.d.  PROCEDURES:  None.  LABORATORY DATA:  On admission, the patient's CBC was 16.4/14.5/41.1/325, 90% neutrophils.  On the day of discharge, it had normalized to 5.3/12.9/37.6/249.  On admission, the patient's BMET was within normal limits 136/3.5/96/27/13/0.7/123.  Potassium decreased to 3.0 on July 18, 2010, and increased to 4.0 prior to discharge.  Blood cultures, no growth to date on the day of discharge after 4 days.  BRIEF HOSPITAL COURSE:  This is a 55 year old female with history of right breast cancer including lymph node dissection, chemotherapy, and radiation therapy with treatment completed in 2009, presenting with right breast cellulitis. 1. Right breast cellulitis.  The patient has a history of right breast     cellulitis in March 2010, which required approximately 1-week     hospital stay.  The patient on admission had an elevated white     count and  was febrile as well as having diffuse body aches.  The     patient's right breast was extremely erythematous and warm to the     touch as well as painful.  Initially, she was started on IV     vancomycin.  After 4 days of remaining afebrile for 3 days, the     patient was switched to p.o. clindamycin and doxycycline.  The     patient was watched in the hospital overnight for 24 hours while on     p.o. antibiotics.  She continued to be afebrile with improving     erythema and warmth to her right breast with improvement.  The     patient will complete a total of a 14-day antibiotic course with     clindamycin and doxycycline.  She was instructed to return if her     breast became more painful, erythematous, or she began having     diffuse body aches, fevers, or chills. 2. Hypertension.  The patient was continued on her home     hydrochlorothiazide with blood pressures in the normal range. 3. History of breast cancer.  The patient was continued on her  home     Femara. 4. Hypokalemia.  The patient was hyperkalemic at 3.0.  On her second    day of hospitalization, it was repleted with KCl 40 mEq x3 and     improved to 4.0.  FOLLOWUP:  The patient is to follow up with Dr. [name] at [facility].  She will call to schedule an appointment to be seen.  DISCHARGE CONDITION:  The patient was discharged home in stable medical condition with p.o. antibiotics to complete a 14-day antibiotic course.    ______________________________ Demetria Pore, MD   ______________________________ Nestor Ramp, MD    JM/MEDQ  D:  07/22/2010  T:  07/23/2010  Job:  [id number]  cc:   [name] [name], M.D.  Electronically Signed by Demetria Pore MD on 07/23/2010 03:54:22 PM Electronically Signed by Denny Levy MD on 08/01/2010 09:34:03 AM

## 2010-08-05 ENCOUNTER — Ambulatory Visit (INDEPENDENT_AMBULATORY_CARE_PROVIDER_SITE_OTHER): Payer: No Typology Code available for payment source | Admitting: Psychiatry

## 2010-08-05 DIAGNOSIS — F0630 Mood disorder due to known physiological condition, unspecified: Secondary | ICD-10-CM

## 2010-08-05 DIAGNOSIS — F411 Generalized anxiety disorder: Secondary | ICD-10-CM

## 2010-08-07 NOTE — H&P (Signed)
NAMEKELSAY, HAGGARD             ACCOUNT NO.:  [id number]  MEDICAL RECORD NO.:  [id number]          PATIENT TYPE:  INP  LOCATION:  3003                         FACILITY:  [facility]  PHYSICIAN:  Nestor Ramp, MD        DATE OF BIRTH:  06-Feb-1956  DATE OF ADMISSION:  07/17/2010 DATE OF DISCHARGE:                             HISTORY & PHYSICAL   PRIMARY CARE PROVIDER:  At [facility] [address].  CHIEF COMPLAINT:  Breast pain.  HISTORY OF PRESENT ILLNESS:  Today at 3:00 p.m., Ms. Jeangilles was at work in her normal state of health when she noted right breast pain and systemic aches and chills.  She finished working and went home.  At home, she examined her breast and noted that it was bright red and warm. She had a similar episode of breast cellulitis 2 years ago and decided to go to the emergency room for treatment.  Otherwise, she is well and in a normal state of health.  No other issues pertaining to this cellulitis at this time.  PAST MEDICAL HISTORY: 1. She has a history of right breast cancer and she is status post     lumpectomy and x-ray therapy and chemotherapy. 2. Hypertension. 3. Hyperlipidemia. 4. Anxiety. 5. Chronic pain.  MEDICATIONS: 1. Hydrochlorothiazide 25 daily. 2. Femara 2.5 mg daily. 3. Xanax 1 mg 1-1.5 tablets twice a day as needed. 4. Percocet occasionally.  ALLERGIES:  No known drug allergies.  She requested to avoid VICODIN as this causes increasing anxiety for her.  PAST SURGICAL HISTORY: 1. She is status post lumpectomy of her right breast with a lymph node     dissection. 2. Appendectomy. 3. Cesarean sections x2. 4. D and C x3. 5. Hysteroscopy.  SOCIAL HISTORY:  She is a widower, still mourning the loss of her husband 3 years ago.  She lives with her daughter.  She has several jobs.  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  She is a former tobacco user,  will have an occasional cigarette here and there.  Alcohol, she drinks 5-6 drinks per week.  FAMILY HISTORY:  Mother died of colon cancer.  She was diagnosed when 4 years old.  Father has hypertension as do her brothers.  Her sister died of a brain aneurysm and was 37 years old.  REVIEW OF SYSTEMS:  Positive for fever, chills, sweats, fatigue, headache, rhinorrhea, and the rash on her breast as discussed above, otherwise 12-point review of systems is normal.  OBJECTIVE:  VITAL SIGNS:  Temperature 98.9, heart rate 97, respiratory rate 18, blood pressure 101/65, saturating 95% on room air, and weighing 86.8 kg. GENERAL:  She is a well-woman in no acute distress. HEENT:  Moist mucous membranes.  EOMI. NECK:  No JVD. HEART:  Regular rate and rhythm.  I hear a 2/6 systolic murmur which is nonradiating and best heard at the right upper sternal border. LUNGS:  Clear to auscultation bilaterally. ABDOMEN:  Normoactive bowel sounds, nontender, and nondistended. NEUROLOGIC:  Alert and oriented x3.  She is neurovascularly  intact and has normal gait and normal exam. MUSCULOSKELETAL:  No effusions or nonedematous bilateral lower extremities. BREASTS:  Her right breast is warm, red, and painful.  There is a cellulitic area circumscribed with margins drawn by the ED physician. There is no spreading beyond these margins at the time of this exam at 0650 in the morning.  LABORATORY DATA:  White cell count is 16.4, hemoglobin 14.5, and platelets 325.  BMET is normal.  ASSESSMENT AND PLAN:  This is a 55 year old woman with right breast cellulitis. 1. Cellulitis.  Based on exam and history of prior cellulitis, she has     an elevated white cell count and systemic symptoms.  The onset was     rapid.  Ms. Davidovich has poor lymph drainage to the breast due to     her lymph node dissection.  She has no certain area that would be     the egress for bacterial cause or cellulitis. Plan:  Treat with IV  vancomycin and follow the exam serially.  We will change her oral medications when able, likely we will take doxycycline plus or minus Keflex. 1. Hypertension.  Plan to continue home hydrochlorothiazide. 2. Anxiety.  Plan to continue home Xanax. 3. Pain.  Plan to provide oral oxycodone. 4. History of breast cancer.  Plan to continue Femara as home     medication.  I do not feel like this is a Paget disease of the     breast or any other cancer etiology.  This appears very cellulitic. 5. Fluids, electrolytes, nutrition, and gastrointestinal.  Normal     diet.  We will saline lock IV once, her current bag     is finished. 6. Prophylaxis.  We will use heparin subcutaneously. 7. Disposition.  As per above.     Clementeen Graham, MD   ______________________________ Nestor Ramp, MD    EC/MEDQ  D:  07/17/2010  T:  07/17/2010  Job:  [id number]  Electronically Signed by Clementeen Graham  on 08/06/2010 08:10:25 AM Electronically Signed by Denny Levy MD on 08/07/2010 05:17:19 AM

## 2010-08-25 ENCOUNTER — Ambulatory Visit (INDEPENDENT_AMBULATORY_CARE_PROVIDER_SITE_OTHER): Payer: No Typology Code available for payment source | Admitting: Psychiatry

## 2010-08-25 DIAGNOSIS — F411 Generalized anxiety disorder: Secondary | ICD-10-CM

## 2010-08-25 DIAGNOSIS — F988 Other specified behavioral and emotional disorders with onset usually occurring in childhood and adolescence: Secondary | ICD-10-CM

## 2010-08-25 DIAGNOSIS — F0630 Mood disorder due to known physiological condition, unspecified: Secondary | ICD-10-CM

## 2010-09-01 ENCOUNTER — Other Ambulatory Visit: Payer: Self-pay | Admitting: Gynecology

## 2010-09-09 ENCOUNTER — Ambulatory Visit (INDEPENDENT_AMBULATORY_CARE_PROVIDER_SITE_OTHER): Payer: No Typology Code available for payment source | Admitting: Psychiatry

## 2010-09-09 DIAGNOSIS — F411 Generalized anxiety disorder: Secondary | ICD-10-CM

## 2010-09-09 DIAGNOSIS — F0630 Mood disorder due to known physiological condition, unspecified: Secondary | ICD-10-CM

## 2010-09-09 DIAGNOSIS — F988 Other specified behavioral and emotional disorders with onset usually occurring in childhood and adolescence: Secondary | ICD-10-CM

## 2010-09-18 ENCOUNTER — Emergency Department (HOSPITAL_BASED_OUTPATIENT_CLINIC_OR_DEPARTMENT_OTHER)
Admission: EM | Admit: 2010-09-18 | Discharge: 2010-09-18 | Disposition: A | Discharge status: Nursing Home (Includes ICF) | Payer: PRIVATE HEALTH INSURANCE | Attending: Emergency Medicine | Admitting: Emergency Medicine

## 2010-09-18 ENCOUNTER — Inpatient Hospital Stay (HOSPITAL_COMMUNITY)
Admission: RE | Admit: 2010-09-18 | Discharge: 2010-09-23 | DRG: 601 | Disposition: A | Discharge status: Home / Self Care | Payer: PRIVATE HEALTH INSURANCE | Source: Other Acute Inpatient Hospital | Attending: Family Medicine | Admitting: Family Medicine

## 2010-09-18 DIAGNOSIS — Z853 Personal history of malignant neoplasm of breast: Secondary | ICD-10-CM | POA: Insufficient documentation

## 2010-09-18 DIAGNOSIS — E785 Hyperlipidemia, unspecified: Secondary | ICD-10-CM | POA: Diagnosis present

## 2010-09-18 DIAGNOSIS — I10 Essential (primary) hypertension: Secondary | ICD-10-CM | POA: Insufficient documentation

## 2010-09-18 DIAGNOSIS — N61 Mastitis without abscess: Secondary | ICD-10-CM | POA: Insufficient documentation

## 2010-09-18 DIAGNOSIS — N63 Unspecified lump in unspecified breast: Secondary | ICD-10-CM | POA: Insufficient documentation

## 2010-09-18 DIAGNOSIS — G8929 Other chronic pain: Secondary | ICD-10-CM | POA: Diagnosis present

## 2010-09-18 DIAGNOSIS — F17200 Nicotine dependence, unspecified, uncomplicated: Secondary | ICD-10-CM | POA: Diagnosis present

## 2010-09-18 DIAGNOSIS — F411 Generalized anxiety disorder: Secondary | ICD-10-CM | POA: Diagnosis present

## 2010-09-18 LAB — CBC
HCT: 42 % (ref 36.0–46.0)
Hemoglobin: 14.9 g/dL (ref 12.0–15.0)
MCHC: 35.5 g/dL (ref 30.0–36.0)
WBC: 20.7 10*3/uL — ABNORMAL HIGH (ref 4.0–10.5)

## 2010-09-18 LAB — COMPREHENSIVE METABOLIC PANEL
ALT: 21 U/L (ref 0–35)
Alkaline Phosphatase: 72 U/L (ref 39–117)
CO2: 23 mEq/L (ref 19–32)
GFR calc non Af Amer: >60 mL/min (ref >60)
Glucose, Bld: 115 mg/dL — ABNORMAL HIGH (ref 70–99)
Potassium: 3.5 mEq/L (ref 3.5–5.1)
Sodium: 140 mEq/L (ref 135–145)

## 2010-09-18 LAB — DIFFERENTIAL
Basophils Absolute: 0 10*3/uL (ref 0.0–0.1)
Lymphocytes Relative: 4 % — ABNORMAL LOW (ref 12–46)
Lymphs Abs: 0.8 10*3/uL (ref 0.7–4.0)
Monocytes Absolute: 0.9 10*3/uL (ref 0.1–1.0)
Neutro Abs: 19 10*3/uL — ABNORMAL HIGH (ref 1.7–7.7)

## 2010-09-19 DIAGNOSIS — N61 Mastitis without abscess: Secondary | ICD-10-CM

## 2010-09-19 DIAGNOSIS — I10 Essential (primary) hypertension: Secondary | ICD-10-CM

## 2010-09-19 DIAGNOSIS — F411 Generalized anxiety disorder: Secondary | ICD-10-CM

## 2010-09-19 LAB — COMPREHENSIVE METABOLIC PANEL WITH GFR
ALT: 17 U/L (ref 0–35)
AST: 25 U/L (ref 0–37)
Albumin: 3.6 g/dL (ref 3.5–5.2)
Alkaline Phosphatase: 49 U/L (ref 39–117)
BUN: 7 mg/dL (ref 6–23)
CO2: 27 meq/L (ref 19–32)
Calcium: 9 mg/dL (ref 8.4–10.5)
Chloride: 100 meq/L (ref 96–112)
Creatinine, Ser: 0.78 mg/dL (ref 0.4–1.2)
GFR calc Af Amer: >60 mL/min (ref >60)
GFR calc non Af Amer: >60 mL/min (ref >60)
Glucose, Bld: 122 mg/dL — ABNORMAL HIGH (ref 70–99)
Potassium: 3.2 meq/L — ABNORMAL LOW (ref 3.5–5.1)
Sodium: 137 meq/L (ref 135–145)
Total Bilirubin: 1 mg/dL (ref 0.3–1.2)
Total Protein: 6.3 g/dL (ref 6.0–8.3)

## 2010-09-19 LAB — LACTIC ACID, PLASMA: Lactic Acid, Venous: 1.8 mmol/L (ref 0.5–2.2)

## 2010-09-19 LAB — CBC
Platelets: 306 10*3/uL (ref 150–400)
RDW: 13.5 % (ref 11.5–15.5)
WBC: 15.2 10*3/uL — ABNORMAL HIGH (ref 4.0–10.5)

## 2010-09-20 ENCOUNTER — Inpatient Hospital Stay (HOSPITAL_COMMUNITY): Payer: PRIVATE HEALTH INSURANCE

## 2010-09-20 LAB — BASIC METABOLIC PANEL
BUN: 7 mg/dL (ref 6–23)
CO2: 29 mEq/L (ref 19–32)
GFR calc non Af Amer: >60 mL/min (ref >60)
Glucose, Bld: 107 mg/dL — ABNORMAL HIGH (ref 70–99)
Potassium: 3.8 mEq/L (ref 3.5–5.1)

## 2010-09-20 LAB — CBC
HCT: 37.3 % (ref 36.0–46.0)
MCHC: 34 g/dL (ref 30.0–36.0)
MCV: 93.7 fL (ref 78.0–100.0)
RDW: 13.5 % (ref 11.5–15.5)
WBC: 7.1 10*3/uL (ref 4.0–10.5)

## 2010-09-21 LAB — CBC
MCH: 30.9 pg (ref 26.0–34.0)
MCHC: 33.6 g/dL (ref 30.0–36.0)
MCV: 92.1 fL (ref 78.0–100.0)
Platelets: 281 10*3/uL (ref 150–400)
RDW: 13.2 % (ref 11.5–15.5)
WBC: 5.9 10*3/uL (ref 4.0–10.5)

## 2010-09-21 LAB — BASIC METABOLIC PANEL
GFR calc non Af Amer: >60 mL/min (ref >60)
Potassium: 3.7 mEq/L (ref 3.5–5.1)
Sodium: 137 mEq/L (ref 135–145)

## 2010-09-22 LAB — CBC
HCT: 37.5 % (ref 36.0–46.0)
Hemoglobin: 12.8 g/dL (ref 12.0–15.0)
RBC: 4.08 MIL/uL (ref 3.87–5.11)
WBC: 4.7 10*3/uL (ref 4.0–10.5)

## 2010-09-22 LAB — BASIC METABOLIC PANEL
CO2: 29 mEq/L (ref 19–32)
GFR calc non Af Amer: >60 mL/min (ref >60)
Glucose, Bld: 101 mg/dL — ABNORMAL HIGH (ref 70–99)
Potassium: 3.7 mEq/L (ref 3.5–5.1)
Sodium: 139 mEq/L (ref 135–145)

## 2010-09-23 ENCOUNTER — Ambulatory Visit: Payer: No Typology Code available for payment source | Admitting: Psychiatry

## 2010-09-25 LAB — CULTURE, BLOOD (ROUTINE X 2)
Culture  Setup Time: 201203300208
Culture: NO GROWTH

## 2010-09-30 ENCOUNTER — Ambulatory Visit (INDEPENDENT_AMBULATORY_CARE_PROVIDER_SITE_OTHER): Payer: No Typology Code available for payment source | Admitting: Psychiatry

## 2010-09-30 DIAGNOSIS — F411 Generalized anxiety disorder: Secondary | ICD-10-CM

## 2010-09-30 DIAGNOSIS — F988 Other specified behavioral and emotional disorders with onset usually occurring in childhood and adolescence: Secondary | ICD-10-CM

## 2010-10-06 ENCOUNTER — Other Ambulatory Visit: Payer: Self-pay | Admitting: Gynecology

## 2010-10-06 NOTE — Discharge Summary (Signed)
[place], [place] West             ACCOUNT NO.:  [id number]  MEDICAL RECORD NO.:  [id number]           PATIENT TYPE:  LOCATION:                                 FACILITY:  PHYSICIAN:  Leighton Roach Harding Thomure, M.D.DATE OF BIRTH:  1955/08/04  DATE OF ADMISSION:  09/18/2010 DATE OF DISCHARGE:  09/23/2010                              DISCHARGE SUMMARY   PRIMARY CARE PROVIDER:  [facility].  DISCHARGE DIAGNOSES: 1. Right breast cellulitis. 2. Hypertension. 3. Anxiety. 4. History of right breast cancer status post lumpectomy and lymph     node dissection.  DISCHARGE MEDICATIONS:  New medications include: 1. Clindamycin 300 mg p.o. t.i.d. x10 days with 1 refill. 2. Doxycycline 100 mg p.o. b.i.d. x10 days with 1 refill.  Medications, which remain the same and are to be continued include: 1. Calcium carbonate/vitamin D. 2. Colace 100 mg p.o. b.i.d. p.r.n. 3. Femara 2.5 mg p.o. daily. 4. Hydrochlorothiazide 25 mg p.o. daily. 5. Percocet 1-2 tablets p.o. q.6 h. p.r.n. pain. 6. Xanax 1 mg p.o. q.6 h. p.r.n.  CONSULTS:  None.  PROCEDURES:  Right breast ultrasound on September 20, 2010, showed cellulitis without evidence of abscess, correlation of mammography is recommended when the patient is stable.  The patient has not had mammogram in past year.  There was no discrete mass seen or felt.  LABORATORY DATA:  On the day of admission, the patient's CBC showed a white count of 20.7, hemoglobin of 14.9 with left shift with 92% neutrophils.  Her BMET was within normal limits.  Lactic acid was 1.8. Blood cultures were drawn and were negative.  Prior to discharge, the patient's white count had decreased to 4.7, hemoglobin remained stable at 12.8.  BMET remained stable.  BRIEF HOSPITAL COURSE:  This is a 55 year old female with history of right breast cancer, hypertension, hyperlipidemia, and anxiety presenting with recurrent right breast cellulitis/mastitis. 1. Right breast cellulitis.   The patient was recently admitted in     January for similar symptoms.  At this time, the patient's right     breast pain came on suddenly and was followed quickly by erythema     and warmth of the entire right breast.  The patient was initially     started on IV vancomycin and Zosyn.  Zosyn was quickly stopped.     Vancomycin was given for a total of 3 days and was then switched to     p.o. antibiotics of clindamycin and doxycycline.  The patient will     complete a 10-day course of this.  Prior to discharge, the patient     had been afebrile for greater than 72 hours, had a improving white     blood cell count and clinically, her breast was improving greatly.     In addition since this patient was recently admitted with the same     thing only 2-3 months ago, it was discussed as to whether     prophylactic treatment would be beneficial.  Per the patient's     request, we did not start the patient on a long-term antibiotic.     However,  we did give her a refill to her or to the antibiotics,     which she will be taking, so that if she were to feel as if her     breast was getting tender and getting a repeat cellulitis, she     could just fill her p.o. antibiotics rather than having to wait to     go to the emergency department and be admitted for IV antibiotics. 2. Hypertension.  The patient's hypertension was well controlled.  She     was continued on her home hydrochlorothiazide. 3. Anxiety.  The patient was continued on her home Xanax. 4. Pain control.  The patient does have chronic pain.  In addition,     she did have significant amount of right breast pain associated     with the cellulitis.  She was continued on Percocet as needed for     pain.  The patient also initially was given a short course of     Toradol. 5. History of breast cancer.  The patient was continued on her Femara     while in-house.  In addition, the patient did miss an Oncology     appointment while  hospitalized, and she will make a followup     appointment once she is discharged.  FOLLOWUP APPOINTMENTS:  The patient is to call [facility] for a followup appointment in 1-2 weeks to check on this right breast cellulitis.  In addition, the patient is to call her oncologist and to reschedule her appointment, which she missed while in-house.  DISCHARGE CONDITION:  The patient was discharged home in stable medical condition.    ______________________________ Demetria Pore, MD   ______________________________ Leighton Roach Roslind Michaux, M.D.    JM/MEDQ  D:  09/23/2010  T:  09/24/2010  Job:  [id number]  cc:   [name]  Electronically Signed by Demetria Pore MD on 09/29/2010 10:23:55 PM Electronically Signed by Acquanetta Belling M.D. on 10/06/2010 09:34:23 AM

## 2010-10-06 NOTE — H&P (Signed)
NAMEJASLEN, ADCOX             ACCOUNT NO.:  [id number]  MEDICAL RECORD NO.:  [id number]           PATIENT TYPE:  LOCATION:                                 FACILITY:  PHYSICIAN:  Leighton Roach Elener Custodio, M.D.DATE OF BIRTH:  [birth year]  DATE OF ADMISSION:  09/19/2010 DATE OF DISCHARGE:                             HISTORY & PHYSICAL   PRIMARY CARE PHYSICIAN:  At [facility].  CHIEF COMPLAINT:  Breast pain and swelling.  HISTORY OF PRESENT ILLNESS:  Ms. Picha is a 55 year old female with breast pain x1 day.  The patient has been here before back in January for a right-sided breast cellulitis.  The patient's past medical history is significant for right-sided breast cancer that has been followed up with her oncologist multiple times and has been cancer free since.  She is status post lumpectomy, radiation, and chemotherapy.  The patient states that over the last day, she started to feel pain in the right breast again with movement, worse, started to feel kind of fevers, checked her fever was 102, within hours her breath became very red, swollen, and very painful.  She decided to come in.  She was seen at [facility] ED, stayed long, was found to be febrile still in 102.0, was started on vancomycin and Zosyn and was transferred to [place] for admission.  The patient denies any shortness of breath, chest pain other than the breast pain, any dysuria, polyuria.  The patient denies any trauma to the breast.  PAST MEDICAL HISTORY: 1. The patient has a history of right breast cancer, status post     lumpectomy, radiation therapy and chemotherapy.  The patient also     had many lymph nodes removed. 2. Hypertension. 3. Hyperlipidemia. 4. Anxiety. 5. Chronic pain.  ALLERGIES:  She does not take VICODIN because it causes anxiety.  MEDICATIONS:  She takes; 1. Hydrochlorothiazide 25 mg p.o. daily. 2. Femara 2.5 mg daily. 3. Xanax 1 mg t.i.d. 4. Percocet as needed.  PAST  SURGICAL HISTORY: 1. Once again the lumpectomy and lymph node dissection of the right     breast. 2. Appendectomy. 3. C-sections x2.  SOCIAL HISTORY:  She lives with daughter.  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  She is an occasional smoker, smoking about 1 to 2 cigarettes a day.  Occasional alcohol, drinking about 4 to 5 drinks a week.  No drug use.  FAMILY HISTORY:  Unremarkable.  PERTINENT LABS:  The patient did have a CBC done which showed a white blood cell count of 20.7, hemoglobin of 14.9, hematocrit of 42.0, and platelets of 376 with a 92% neutrophils.  The patient's BMET showed a sodium of 148, potassium of 3.5, chloride 99, bicarb of 23, BUN of 12, and a creatinine of 0.7 glucose of 115.  The patient's total bili was 1.0.  Total protein is 8.6.  PHYSICAL EXAMINATION:  VITAL SIGNS:  The patient's temperature is 99.0, pulse is 110, respirations 18, systolic blood pressure is 132 with diastolic of 88, 95% on room air. GENERAL:  No apparent distress.  The patient does have  a lot of pressure speech and does seem very anxious. HEENT:  Pupils are equal, round, reactive to light, and accommodation. Extraocular movements intact. NECK:  No lymphadenopathy.  No thyromegaly. CV:  Tachy, but regular rhythm.  No murmur appreciated. LUNGS:  Clear to auscultation bilaterally.  Bowel sounds positive, nontender, nondistended. EXTREMITIES:  Have no edema. NEUROLOGIC:  Neurovascularly intact. BREASTS:  Looking at the right breast, the patient does have significant breast size bilaterally, but the right one has significant erythema, very indurated and swollen.  The patient has been marked since coming from [facility] and does not seem to have any increasing area to the erythema.  No discharge.  No focal finding for the origination site.  No nipple discharge.  ASSESSMENT AND PLAN:  Ms. Higginbotham is a 55 year old female admitted with a right breast  cellulitis. 1. Cellulitis.  We will start vancomycin and Zosyn again.  As she     delayed her previous visits, we will await blood cultures which     were taken before the patient got antibiotics.  The patient was     then transitioned to clindamycin and doxycycline after the patient     did well, tolerated well, and was able to go home thereafter on a     14-day course.  Hopefully, we can continue to do the same.  We will     monitor the patient's white blood cells while that she is here to     see if the patient is responding well. 2. Hypertension.  We will continue her hydrochlorothiazide. 3. Anxiety.  We will continue Xanax. 4. Pain control.  We will do a combo of Percocet that she takes at     home as well as Toradol to help with inflammatory response. 5. FE and GI.  We will do regular diet and KVO IV fluids. 6. Prophylaxis.  We will do heparin and PPI. 7. Disposition.  This will be pending clinical improvement.     Antoine Primas, DO   ______________________________ Etta Grandchild, M.D.    ZS/MEDQ  D:  09/18/2010  T:  09/19/2010  Job:  [id number]  Electronically Signed by Antoine Primas  on 10/01/2010 07:49:09 AM Electronically Signed by Acquanetta Belling M.D. on 10/06/2010 09:34:25 AM

## 2010-10-07 ENCOUNTER — Ambulatory Visit (INDEPENDENT_AMBULATORY_CARE_PROVIDER_SITE_OTHER): Payer: No Typology Code available for payment source | Admitting: Psychiatry

## 2010-10-07 DIAGNOSIS — F988 Other specified behavioral and emotional disorders with onset usually occurring in childhood and adolescence: Secondary | ICD-10-CM

## 2010-10-07 DIAGNOSIS — F411 Generalized anxiety disorder: Secondary | ICD-10-CM

## 2010-10-07 LAB — BASIC METABOLIC PANEL
BUN: 10 mg/dL (ref 6–23)
BUN: 7 mg/dL (ref 6–23)
BUN: 9 mg/dL (ref 6–23)
CO2: 26 mEq/L (ref 19–32)
CO2: 29 mEq/L (ref 19–32)
Calcium: 9.5 mg/dL (ref 8.4–10.5)
Chloride: 103 mEq/L (ref 96–112)
Chloride: 98 mEq/L (ref 96–112)
Chloride: 99 mEq/L (ref 96–112)
Creatinine, Ser: 0.72 mg/dL (ref 0.4–1.2)
GFR calc non Af Amer: >60 mL/min (ref >60)
GFR calc non Af Amer: >60 mL/min (ref >60)
Glucose, Bld: 101 mg/dL — ABNORMAL HIGH (ref 70–99)
Glucose, Bld: 104 mg/dL — ABNORMAL HIGH (ref 70–99)
Glucose, Bld: 121 mg/dL — ABNORMAL HIGH (ref 70–99)
Glucose, Bld: 114 mg/dL — ABNORMAL HIGH (ref 70–99)
Potassium: 3 mEq/L — ABNORMAL LOW (ref 3.5–5.1)
Potassium: 3.7 mEq/L (ref 3.5–5.1)
Sodium: 134 mEq/L — ABNORMAL LOW (ref 135–145)
Sodium: 136 mEq/L (ref 135–145)

## 2010-10-07 LAB — CBC
HCT: 37.4 % (ref 36.0–46.0)
HCT: 38.2 % (ref 36.0–46.0)
Hemoglobin: 13.3 g/dL (ref 12.0–15.0)
Hemoglobin: 13.3 g/dL (ref 12.0–15.0)
Hemoglobin: 15.2 g/dL — ABNORMAL HIGH (ref 12.0–15.0)
MCHC: 34.9 g/dL (ref 30.0–36.0)
MCHC: 35 g/dL (ref 30.0–36.0)
MCHC: 35.8 g/dL (ref 30.0–36.0)
MCV: 92.4 fL (ref 78.0–100.0)
MCV: 94 fL (ref 78.0–100.0)
MCV: 94.4 fL (ref 78.0–100.0)
Platelets: 300 10*3/uL (ref 150–400)
Platelets: 320 10*3/uL (ref 150–400)
Platelets: 340 10*3/uL (ref 150–400)
Platelets: 414 10*3/uL — ABNORMAL HIGH (ref 150–400)
RDW: 12.8 % (ref 11.5–15.5)
RDW: 12.8 % (ref 11.5–15.5)
RDW: 12.8 % (ref 11.5–15.5)
RDW: 11.6 % (ref 11.5–15.5)
WBC: 17.7 10*3/uL — ABNORMAL HIGH (ref 4.0–10.5)
WBC: 21.1 10*3/uL — ABNORMAL HIGH (ref 4.0–10.5)

## 2010-10-07 LAB — DIFFERENTIAL
Basophils Absolute: 0.2 10*3/uL — ABNORMAL HIGH (ref 0.0–0.1)
Basophils Relative: 1 % (ref 0–1)
Eosinophils Absolute: 0 10*3/uL (ref 0.0–0.7)
Eosinophils Absolute: 0 10*3/uL (ref 0.0–0.7)
Eosinophils Relative: 0 % (ref 0–5)
Lymphocytes Relative: 2 % — ABNORMAL LOW (ref 12–46)
Lymphs Abs: 1.2 10*3/uL (ref 0.7–4.0)
Neutrophils Relative %: 96 % — ABNORMAL HIGH (ref 43–77)

## 2010-10-07 LAB — MAGNESIUM: Magnesium: 2.1 mg/dL (ref 1.5–2.5)

## 2010-10-08 ENCOUNTER — Other Ambulatory Visit: Payer: Self-pay | Admitting: Physician Assistant

## 2010-10-08 ENCOUNTER — Encounter (HOSPITAL_BASED_OUTPATIENT_CLINIC_OR_DEPARTMENT_OTHER): Payer: No Typology Code available for payment source | Admitting: Oncology

## 2010-10-08 DIAGNOSIS — C50319 Malignant neoplasm of lower-inner quadrant of unspecified female breast: Secondary | ICD-10-CM

## 2010-10-08 DIAGNOSIS — Z170 Estrogen receptor positive status [ER+]: Secondary | ICD-10-CM

## 2010-10-08 LAB — CBC WITH DIFFERENTIAL/PLATELET
Eosinophils Absolute: 0 10*3/uL (ref 0.0–0.5)
MONO#: 0.3 10*3/uL (ref 0.1–0.9)
NEUT#: 3.7 10*3/uL (ref 1.5–6.5)
RBC: 4.3 10*6/uL (ref 3.70–5.45)
RDW: 13 % (ref 11.2–14.5)
WBC: 5.7 10*3/uL (ref 3.9–10.3)
lymph#: 1.6 10*3/uL (ref 0.9–3.3)

## 2010-10-09 LAB — COMPREHENSIVE METABOLIC PANEL
Albumin: 4.4 g/dL (ref 3.5–5.2)
Alkaline Phosphatase: 53 U/L (ref 39–117)
CO2: 26 mEq/L (ref 19–32)
Glucose, Bld: 102 mg/dL — ABNORMAL HIGH (ref 70–99)
Potassium: 3.9 mEq/L (ref 3.5–5.3)
Sodium: 140 mEq/L (ref 135–145)
Total Protein: 6.8 g/dL (ref 6.0–8.3)

## 2010-10-09 LAB — CANCER ANTIGEN 27.29: CA 27.29: 20 U/mL (ref 0–39)

## 2010-10-09 LAB — LACTATE DEHYDROGENASE: LDH: 153 U/L (ref 94–250)

## 2010-10-13 ENCOUNTER — Other Ambulatory Visit: Payer: Self-pay | Admitting: Oncology

## 2010-10-13 DIAGNOSIS — L0390 Cellulitis, unspecified: Secondary | ICD-10-CM

## 2010-10-14 ENCOUNTER — Ambulatory Visit (INDEPENDENT_AMBULATORY_CARE_PROVIDER_SITE_OTHER): Payer: No Typology Code available for payment source | Admitting: Psychiatry

## 2010-10-14 DIAGNOSIS — Z638 Other specified problems related to primary support group: Secondary | ICD-10-CM

## 2010-10-14 DIAGNOSIS — F988 Other specified behavioral and emotional disorders with onset usually occurring in childhood and adolescence: Secondary | ICD-10-CM

## 2010-10-14 DIAGNOSIS — F411 Generalized anxiety disorder: Secondary | ICD-10-CM

## 2010-10-22 ENCOUNTER — Other Ambulatory Visit: Payer: No Typology Code available for payment source

## 2010-10-28 ENCOUNTER — Ambulatory Visit (INDEPENDENT_AMBULATORY_CARE_PROVIDER_SITE_OTHER): Payer: No Typology Code available for payment source | Admitting: Psychiatry

## 2010-10-28 DIAGNOSIS — F988 Other specified behavioral and emotional disorders with onset usually occurring in childhood and adolescence: Secondary | ICD-10-CM

## 2010-10-28 DIAGNOSIS — F411 Generalized anxiety disorder: Secondary | ICD-10-CM

## 2010-10-29 ENCOUNTER — Encounter (INDEPENDENT_AMBULATORY_CARE_PROVIDER_SITE_OTHER): Payer: Self-pay | Admitting: General Surgery

## 2010-11-03 ENCOUNTER — Ambulatory Visit
Admission: RE | Admit: 2010-11-03 | Discharge: 2010-11-03 | Disposition: A | Discharge status: Home / Self Care | Payer: PRIVATE HEALTH INSURANCE | Source: Ambulatory Visit | Attending: Oncology | Admitting: Oncology

## 2010-11-03 DIAGNOSIS — L0390 Cellulitis, unspecified: Secondary | ICD-10-CM

## 2010-11-03 MED ORDER — GADOBENATE DIMEGLUMINE 529 MG/ML IV SOLN
17.0000 mL | Freq: Once | INTRAVENOUS | Status: AC | PRN
  Administered 2010-11-03: 17 mL via INTRAVENOUS

## 2010-11-04 ENCOUNTER — Ambulatory Visit (INDEPENDENT_AMBULATORY_CARE_PROVIDER_SITE_OTHER): Payer: No Typology Code available for payment source | Admitting: Psychiatry

## 2010-11-04 DIAGNOSIS — F411 Generalized anxiety disorder: Secondary | ICD-10-CM

## 2010-11-04 DIAGNOSIS — F988 Other specified behavioral and emotional disorders with onset usually occurring in childhood and adolescence: Secondary | ICD-10-CM

## 2010-11-04 NOTE — Op Note (Signed)
NAMEGABBRIELLA, PRESSWOOD             ACCOUNT NO.:  [id number]   MEDICAL RECORD NO.:  [id number]          PATIENT TYPE:  AMB   LOCATION:  DAY                          FACILITY:  [facility]   PHYSICIAN:  Lennie Muckle, MD      DATE OF BIRTH:  [birth year]   DATE OF PROCEDURE:  08/05/2007  DATE OF DISCHARGE:  08/05/2007                               OPERATIVE REPORT   PREOPERATIVE DIAGNOSIS:  Right breast cancer with metastases to the  right axilla.   POSTOPERATIVE DIAGNOSIS:  Right breast cancer with metastases to the  right axilla.   PROCEDURE PERFORMED:  Right axillary node dissection.   SURGEON:  Lennie Muckle, M.D.   ASSISTANT:  Ollen Gross. Vernell Morgans, M.D.   ANESTHESIA:  General endotracheal anesthesia.   FINDINGS:  Scar tissue in the axilla, small lymph nodes noted in the  level 2 specimen by axillary contents, to pathology.   ESTIMATED BLOOD LOSS:  Approximately 50 mL.   COMPLICATIONS:  No immediate complications.   DRAINS:  A No. 19 Blake drain was placed in the right axilla.   INDICATIONS FOR PROCEDURE:  Ms. Laidler is a 55 year old female, who had  a lumpectomy with sentinel node biopsy.  The one lymph node was positive  for metastatic cancer.  Thus, an axillary node dissection was indicated.  Informed consent was obtained prior to the procedure.   DESCRIPTION OF PROCEDURE:  Ms. Carnathan was identified in the  preoperative holding area.  She was given 1 gram of Kefzol and then  taken to the operating room.  Once in the operating room, she was placed  in a supine position, and placed under general anesthesia.  Her right  breast and axilla, as well as the arm, were prepped and draped in the  usual, sterile fashion.  A time-out procedure, indicating the patient  and the procedure, were performed.  Using 0.25% Marcaine with  epinephrine, I anesthetized the skin in the previous incision of the  right axilla.  Subcutaneous tissue was divided with electrocautery.  There was scar  tissue present within the previous dissection.  This was  divided with the electrocautery.  Was able to identify the pectoralis  muscle.  The medial pectoral branch was identified and preserved.  I  followed the border of the pectoralis muscle inferiorly, and followed  along the chest wall inferiorly and superiorly to locate the axillary  vein laterally.  There were some inflammatory changes on the lateral  aspect of the pectoralis muscles superiorly.  This was carefully  dissected and divided with the electrocautery.  Once we cleared off the  lateral border of the pectoralis minor muscle.  We turned our attention  laterally.  A skin flap was raised superiorly and laterally.  We then  dissected down to identify the latissimus dorsi.  Due to the scar  tissue, we chose to go back to the medial border of the pectoralis minor  and follow the chest wall from medial to lateral.  We then clearly saw  the axillary vein, and identified the long thoracic nerve.  This was  preserved, and  the axillary contents were dissected away from the long  thoracic nerve.  I did continue to dissect the axillary contents  underneath the pectoralis minor muscle.  A lymph node was approximately  1-1/2 cm in size and was identified deep to the pectoralis muscle.  This  was dissected carefully with blunt dissection, as well as electrocautery  to remove the specimen from beneath the pectoralis muscle.  I then  continued laterally along the axillary vein.  We were able to identify  the long thoracic nerve and bundle.  This was preserved, and the  axillary contents were dissected away from this nerve.  The remaining  attachments from the latissimus muscle were dissected with the  electrocautery.  The specimen was then removed from the operative field.  The axilla was then irrigated with approximately 3/4 liter of saline.  There was no evidence of bleeding at the end of the case.  A No. 19  Blake drain was placed into the  axilla.  This was secured with a 2-0  nylon suture.  The wound bed was then closed in interrupted fashion with  3-0 Vicryl suture at the dermis.  The skin was closed with 4-0 Monocryl  in a running fashion.  Steri-strips and a dry gauze were place for final  dressing.  The catheter was placed to suction.  The patient was  extubated and transported to post-anesthesia care unit in stable  condition.  She will be discharged with wound care, and monitor her  output, and be followed up in a week to ascertain whether her drain can  be discontinued.      Lennie Muckle, MD  Electronically Signed     ALA/MEDQ  D:  08/05/2007  T:  08/07/2007  Job:  [id number]

## 2010-11-04 NOTE — Op Note (Signed)
[name], [name]             ACCOUNT NO.:  [id number]   MEDICAL RECORD NO.:  [id number]          PATIENT TYPE:  AMB   LOCATION:  SDS                          FACILITY:  [facility]   PHYSICIAN:  Lennie Muckle, MD      DATE OF BIRTH:  March 08, 1956   DATE OF PROCEDURE:  07/04/2007  DATE OF DISCHARGE:  07/04/2007                               OPERATIVE REPORT   PREOP DIAGNOSIS:  Right breast cancer.   POSTOP DIAGNOSIS:  Right breast cancer.   PROCEDURE:  Needle localized lumpectomy with sentinel node biopsy.   SURGEON:  Lennie Muckle, M.D.   ASSISTANT:  No assistant.   ANESTHESIA:  General endotracheal.   SPECIMENS:  Right breast tissue and 3 sentinel nodes.   INDICATIONS FOR PROCEDURE:  Ms. Madison West is a 55 year old female who was  found to have lesion on mammogram, biopsy confirmed invasive breast  cancer.  She then had another biopsy, second site, same quadrant which  was also positive.  Informed consent was obtained for needle localized  biopsy of both lesions.  I did discuss with the patient preoperatively  due to the proximity of the nipple that this may require removal of the  nipple areolar complex.  Informed consent was obtained prior to the  procedure.   DETAILS OF THE PROCEDURE:  Ms. Madison West was identified in the  preoperative holding suite and taken to the operating room, placed in a  supine position.  After administration of general endotracheal  anesthesia, her right breast and right arm were prepped and draped in  usual sterile fashion.  A time out procedure identifying the patient and  the procedure were peformed. The skin just beneath the areola was  injected with methylene blue in all 4 quadrants and the area was also  injected at the site of the lesion.  I massaged the breast for  approximately 5 minutes.  Using the Neoprobe, I was able to isolate an  area with increased activity in the axilla.  Incision was placed with a  #15 blade.  Subcutaneous tissue was  divided with the electrocautery.  I  was able to isolate a lymph node which was blue in color as well as hot.  The count on the Neoprobe was approximately 4000 in sentinel node #1.  I  then placed the probe back within the wound bed and there appeared to be  continued activity.  With further dissection, there were 2 nodes just  beneath the pectoralis muscle which were able to be identified and  removed from the area, one was counted at 999, the second one  approximately 149.  These were sent to pathology for final review.  The  wound bed was irrigated and closed in interrupted 3-0 Vicryl and then  skin was closed with 4-0 Monocryl.  I then turned my attention to the  right breast.  After reviewing the films, I placed an areolar incision  approximately 6-9 o'clock.  Subcutaneous tissue was divided with the  electrocautery.  I was able to keep the wires in place and perform an  incision of the tissue from  the 6 to the 9 o'clock position.  I did use  a #15 blade to cut close to the nipple in order to spare the outer  tissue.  I carried the dissection deep into the tissues to include the  deeper needle and I did incise extra tissue both deep and superior to  the 6 o'clock position as also anterior and lateral to the 9 o'clock  specimen.  The wound bed was irrigated, hemostasis obtained with  electrocautery.  The wound bed was marked with the clip applier in case  further dissection and new tissue excision was necessary.  The wound bed  was irrigated.  The wound was closed with interrupted 3-0 Vicryl sutures  and skin was closed with 4-0 Monocryl.  The area was anesthetized with  approximately 50 mL of 0.25% Marcaine and 1% lidocaine.  Steri-Strips  and fluff dressing was placed prior to extubation.  The patient was then  extubated and transported to the postanesthesia care unit and she will  followup with me in approximately 2 weeks.  I will call the patient and  provide her with her pathology  results once these are available.      Lennie Muckle, MD  Electronically Signed     ALA/MEDQ  D:  07/04/2007  T:  07/05/2007  Job:  [id number]

## 2010-11-04 NOTE — H&P (Signed)
[name], West             ACCOUNT NO.:  [id number]   MEDICAL RECORD NO.:  [id number]          PATIENT TYPE:  INP   LOCATION:  5007                         FACILITY:  [facility]   PHYSICIAN:  Nestor Ramp, MD        DATE OF BIRTH:  [birth year]   DATE OF ADMISSION:  08/15/2008  DATE OF DISCHARGE:                              HISTORY & PHYSICAL   PRIMARY CARE PHYSICIAN:  Dr. [name] at [facility].  Dr. Donnie Coffin,  heme/oncologist.   CHIEF COMPLAINT:  Right breast swelling.   HISTORY OF PRESENT ILLNESS:  This is a 55 year old white female status  post chemotherapy, radiation, lumpectomy, lymph node biopsy last year  who presented to [facility] emergency department with acute onset of  right breast swelling occurring today.  The patient states that 2 weeks  ago she had an episode of bronchitis treated with a Z-Pak which had  nearly resolved and she was in her usual state of health when today she  started feeling bad and felt chills.  She went home and took her  temperature, which was 103. At that time she looked at her right breast  and saw that it was very red and warm to the touch.  She said this is  unlike any changes that she has noted before.  Previously she has had  swelling of the breast and has dealt with drains postoperatively for her  breast and also has had a punch biopsy byher breast surgeon for skin  changes that were noted, but this biopsy was normal.  The patient has no  history of lymphedema in his arm and has no history of MRSA bacterial  skin infection.  The patient denies any other lesions or rashes or  contact with new detergents or clothing.   PAST MEDICAL HISTORY:  1. Right breast cancer status post lumpectomy, lymph node dissection,      chemotherapy, and radiation completed summer of 21-Nov-2007.  2. Hypertension.   PAST SURGICAL HISTORY:  1. Right lumpectomy see above.  2. Appendectomy at age 29.  3. C-sections x2.  4. Dilation and curettage.   SOCIAL  HISTORY:  The patient was widowed in 2007-11-21 as her husband died of  lung cancer.  She lives with her two teenage daughters, both of whom are  present with her in the hospital currently.  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  The  patient reports she has smoked for 25 years but quit smoking 12 years  ago.  She denies any recent or remote alcohol use or illicit drug use.   FAMILY HISTORY:  Mother:  Colon cancer.  Father:  Hypertension and  myocardial infarction. Siblings:  Brother with hypertension and a sister  with a brain aneurysm.   REVIEW OF SYSTEMS:  Please see H and P. Positive for fevers, chills,  rhinorrhea, nonproductive cough.  Negative for headache, sore throat,  chest pain, shortness of breath, nausea, vomiting, diarrhea, rash,  myalgias, arthralgias, weakness, numbness, dizziness.   ALLERGIES:  No known drug allergies.  MEDICATIONS:  Femara 2.5 mg daily, Xanax 1 mg t.i.d. as needed for  anxiety, hydrochlorothiazide 25 mg daily.   PHYSICAL EXAM:  VITAL SIGNS:  Temperature 98.6 degrees, pulse 117,  respirations 20, blood pressure 127/88, pulse ox 94% on room air.  GENERAL:  No acute distress, comfortable.  HEENT: Mucous membranes moist.  CARDIOVASCULAR: Tachycardia, no murmurs, rubs or gallops.  LUNGS: Clear to auscultation bilaterally.  ABDOMEN: Positive bowel sounds, soft, no tenderness to palpation.  BREASTS:  Right breast entirely erythematous with borders extending into  axilla, it is warm to the touch.  Visual examination shows no skin  lesions or source or entry point for infection.  Surgical scars were  noted around areola and axilla.  Palpation reveals skin along the  inferior border of the breast with a peau de orange appearance.  The  patient states these have been stable since radiation and have been  biopsied with normal results.  No fluctuant mass was felt.  No  lymphadenopathy noted.  EXTREMITIES: No  upper or lower extremity edema noted.  No signs of  lymphedema.   LABS:  BMET:  Sodium 136, potassium 3, chloride 99, bicarbonate 26, BUN  15, creatinine 0.8, platelets 114, calcium 9.5.  CBC:  White blood count  21.1, hemoglobin of 15.2, hematocrit 43, platelets 414, neutrophils 96%  greater than 20%.  Chest x-ray no active cardiopulmonary disease.  Surgical clips in the right breast and axilla. EKG sinus tachycardia at  132 beats per minute.  Blood cultures x2 pending.   ASSESSMENT/PLAN:  This is a 55 year old white female status post  chemotherapy, radiation, lumpectomy, lymph node dissection of her right  breast with 1-day history of fever, chills, and right breast swelling.  1. Right breast cellulitis.  The patient's history and exam are      consistent with post radiation cellulitis.  The patient is without      signs of fluid collection or abscess.  If the patient has no      improvement on IV antibiotics after 48 hours, consider an      ultrasound to rule out abscess.  Other diagnoses in the      differential would not be likely given the patient's white blood      count and fever but would include inflammatory breast cancer for      which the patient had a recent negative punch biopsy, DVT, although      the patient has no lymphedema in that arm, and contact dermatitis.      We will cover for MRSA and strep infections with vancomycin,      Rocephin with goal to transition to p.o. Keflex and clindamycin      once improving.  The patient reports minimal pain and will write      for ibuprofen and Percocet as needed.  2. Hypertension.  Blood pressure well controlled.  Will continue      hydrochlorothiazide at home dose once no longer tachycardiac.  3. Anxiety.  The patient will be continued on home dose of Xanax 1 mg      t.i.d. as needed. I suspect anxiety is the likely source of her      tachycardia in addition to her fever.  4. Fluids, electrolytes, nutrition and  gastrointestinal:  Will give 1      liter bolus secondary to fever and tachycardia      and then we will saline lock IV.  The patient will have a regular  diet.  Will replete potassium and monitor.  5. Prophylaxis.  Heparin 5000 units subcu t.i.d.  The patient will      take a p.o. diet.   DISPOSITION:  Pending clinical improvement on IV antibiotics.      Delbert Harness, MD  Electronically Signed      Nestor Ramp, MD  Electronically Signed    KB/MEDQ  D:  08/16/2008  T:  08/16/2008  Job:  [id number]

## 2010-11-04 NOTE — Discharge Summary (Signed)
[name], Madison West             ACCOUNT NO.:  [id number]   MEDICAL RECORD NO.:  [id number]          PATIENT TYPE:  INP   LOCATION:  5007                         FACILITY:  [facility]   PHYSICIAN:  Paula Compton, MD        DATE OF BIRTH:  10-24-1955   DATE OF ADMISSION:  08/15/2008  DATE OF DISCHARGE:  08/21/2008                               DISCHARGE SUMMARY   PRIMARY CARE [name]:  [name] [name], MD   DISCHARGE DIAGNOSES:  1. Right breast cellulitis.  2. Hypertension.  3. Pain management.  4. Breast cancer.  5. Anxiety.  6. Depression.   DISCHARGE MEDICATIONS:  1. Hydrochlorothiazide 25 mg p.o. daily, this is a home med.  2. Femara 2.5 mg p.o. daily, this is a home medication.  3. Xanax 1 mg p.o. b.i.d. as needed for anxiety.  The patient was      given a prescription even this is a home med for 40 pills.  4. Keflex 500 mg p.o. b.i.d. x14 days.  The patient given 24 pills.  5. Doxycycline 100 mg p.o. b.i.d. x14 days.  The patient given 24      pills.  6. Percocet 5/325, 1-2 tablets every 6 hours as needed for pain.  The      patient given 24 pills.  7. Colace 100 mg p.o. b.i.d. as needed for constipation.  The patient      also given a prescription for Colace.   CONSULTS BETWEEN THIS HOSPITALIZATION:  The ID was consulted on August 16, 2008.  Also, Surgery was consulted on August 18, 2008, the  physician was Dr. Bertram Savin.   PROCEDURES:  The patient had a chest x-ray taken on August 15, 2008,  and shows no active cardiopulmonary disease.  The patient also had  multiple labs during her hospitalization.  On admission on August 15, 2008, she had a basic metabolic panel that showed a potassium that was a  little bit low at 6.0.  Also, she __________.  Her electrolyte panel on  August 15, 2008.  She also had a CBC that showed a white blood cell  count of __________ and a platelet count of 204 with 96% neutrophils, 2%  lymphocytes, and 2% monocytes.  On August 16, 2008, the patient has  had a decreased sodium level of 144, potassium was very low at  __________ magnesium level was 2.1 on August 16, 2008.  During the  rest of the hospitalization, the patient had multiple CBCs and basic  metabolic panels done.  Her last CBC was done on July 30, 2008.  The  patient had a white blood cell count of 4.6, hemoglobin of 13.3,  hematocrit of 37.1, and platelets of 350.   BRIEF HOSPITAL COURSE:  This is a 55 year old female, status post  lumpectomy in 2009 who presented to the [facility] ED with 1-day  history of right breast swelling and erythema.  She had started feeling  chills, had gone home, and taken her temperature which she found to be  103.  The patient noticed that her breast was enlarged  and was  erythematous, so that the patient went to [facility].  Of note, her  oncologist is Dr. Donnie Coffin.  The patient was treated with a full course of  chemo and radiation that was completed in December of 2009.  She also  had lymph node dissection and breast lumpectomy.  The patient was found  to have a large cellulitis on the right breast.  1. Cellulitis.  Initially, the patient was febrile on admission and      was put on IV vancomycin and Rocephin.  She was continued on the      antibiotics until she was 24 hours status post fever.  At which      time, she was started on oral antibiotics of Keflex and      doxycycline.  Infectious Disease was consulted for duration of time      for treatment.  It was recommended that the patient be treated for      14 days with oral antibiotics status post discharge, and the      patient was sent home with prescription for Keflex and doxycycline.      The patient's erythema at the time of discharge was beginning to      regress, and it definitely decreased in redness, but the patient      continues to have warmth in the right breast; however, this is      expected since she has compromised lymphatics on that side.  The       patient will follow up with a right breast ultrasound and follow up      with Dr. Freida Busman after she had the ultrasound of the breast since her      appointment was 1 O'clock on August 21, 2008, the phone number is [phone]      [phone].  She will follow up with Dr. Freida Busman after this ultrasound is      done.  2. Hypertension.  The patient has been stable throughout this      hospitalization on her home med of hydrochlorothiazide 25 mg p.o.      daily.  There were, therefore, no issues with her hypertension.  3. The patient initially came in with some tachycardia; however, this      resolved, and the patient had no more tachycardia.  This was likely      due to her fevers.  4. Anxiety.  The patient does experience anxiety and some depression.      The patient takes Xanax at home for anxiety.  Her oncologist, I      believe, is the one that usually prescribes this.  She was given      some Xanax, the medication __________ she can speak with her      oncologist.  5. Depression.  The patient was approached about starting an SSRI      during this hospitalization, but the patient declined.  She is not      interested at this time for starting antidepressants.  6. Hypokalemia.  The patient initially had some hypokalemia at 3.0.      Prior to discharge, the patient did have potassium repleted on      August 16, 2008, and her potassium maintained normal level at the      time of discharge.  The patient was continued on her home      medications of Femara for her breast cancer, for the pain      associated with  the right breast erythema and swelling.  The      patient was well controlled on Percocet.  The patient was sent home      with a prescription for some Percocet to decrease the pain.  The      patient will continue the medications for the next 14 days.  She      will have followup appointments with her surgeon Dr. Bertram Savin as      well as the ultrasound that was scheduled for the day of  discharge.      There are no current issues to follow up in the hospitalization      other than the right breast cellulitis.  The patient was discharged      to home in stable medical condition.      Jamie Brookes, MD  Electronically Signed      Paula Compton, MD  Electronically Signed    AS/MEDQ  D:  08/21/2008  T:  08/22/2008  Job:  [id number]   cc:   Lennie Muckle, MD  Stan Head. [name], M.D.

## 2010-11-11 ENCOUNTER — Ambulatory Visit (INDEPENDENT_AMBULATORY_CARE_PROVIDER_SITE_OTHER): Payer: No Typology Code available for payment source | Admitting: Psychiatry

## 2010-11-11 DIAGNOSIS — F411 Generalized anxiety disorder: Secondary | ICD-10-CM

## 2010-11-11 DIAGNOSIS — F988 Other specified behavioral and emotional disorders with onset usually occurring in childhood and adolescence: Secondary | ICD-10-CM

## 2010-11-18 ENCOUNTER — Ambulatory Visit (INDEPENDENT_AMBULATORY_CARE_PROVIDER_SITE_OTHER): Payer: No Typology Code available for payment source | Admitting: Psychiatry

## 2010-11-18 DIAGNOSIS — F988 Other specified behavioral and emotional disorders with onset usually occurring in childhood and adolescence: Secondary | ICD-10-CM

## 2010-11-18 DIAGNOSIS — F411 Generalized anxiety disorder: Secondary | ICD-10-CM

## 2010-11-25 ENCOUNTER — Ambulatory Visit (INDEPENDENT_AMBULATORY_CARE_PROVIDER_SITE_OTHER): Payer: No Typology Code available for payment source | Admitting: Psychiatry

## 2010-11-25 DIAGNOSIS — F988 Other specified behavioral and emotional disorders with onset usually occurring in childhood and adolescence: Secondary | ICD-10-CM

## 2010-11-25 DIAGNOSIS — F411 Generalized anxiety disorder: Secondary | ICD-10-CM

## 2010-12-02 ENCOUNTER — Ambulatory Visit (INDEPENDENT_AMBULATORY_CARE_PROVIDER_SITE_OTHER): Payer: No Typology Code available for payment source | Admitting: Psychiatry

## 2010-12-02 DIAGNOSIS — F988 Other specified behavioral and emotional disorders with onset usually occurring in childhood and adolescence: Secondary | ICD-10-CM

## 2010-12-02 DIAGNOSIS — F411 Generalized anxiety disorder: Secondary | ICD-10-CM

## 2010-12-05 ENCOUNTER — Ambulatory Visit: Payer: No Typology Code available for payment source | Admitting: Gynecology

## 2010-12-09 ENCOUNTER — Ambulatory Visit (INDEPENDENT_AMBULATORY_CARE_PROVIDER_SITE_OTHER): Payer: No Typology Code available for payment source | Admitting: Psychiatry

## 2010-12-09 DIAGNOSIS — F411 Generalized anxiety disorder: Secondary | ICD-10-CM

## 2010-12-09 DIAGNOSIS — F988 Other specified behavioral and emotional disorders with onset usually occurring in childhood and adolescence: Secondary | ICD-10-CM

## 2010-12-11 ENCOUNTER — Encounter (INDEPENDENT_AMBULATORY_CARE_PROVIDER_SITE_OTHER): Payer: Self-pay | Admitting: General Surgery

## 2010-12-23 ENCOUNTER — Ambulatory Visit (INDEPENDENT_AMBULATORY_CARE_PROVIDER_SITE_OTHER): Payer: No Typology Code available for payment source | Admitting: Psychiatry

## 2010-12-23 DIAGNOSIS — F988 Other specified behavioral and emotional disorders with onset usually occurring in childhood and adolescence: Secondary | ICD-10-CM

## 2010-12-23 DIAGNOSIS — F411 Generalized anxiety disorder: Secondary | ICD-10-CM

## 2010-12-25 ENCOUNTER — Encounter (HOSPITAL_BASED_OUTPATIENT_CLINIC_OR_DEPARTMENT_OTHER): Payer: PRIVATE HEALTH INSURANCE | Admitting: Oncology

## 2010-12-25 ENCOUNTER — Other Ambulatory Visit: Payer: Self-pay | Admitting: Physician Assistant

## 2010-12-25 DIAGNOSIS — Z170 Estrogen receptor positive status [ER+]: Secondary | ICD-10-CM

## 2010-12-25 DIAGNOSIS — C50319 Malignant neoplasm of lower-inner quadrant of unspecified female breast: Secondary | ICD-10-CM

## 2010-12-25 LAB — CBC WITH DIFFERENTIAL/PLATELET
Basophils Absolute: 0 10*3/uL (ref 0.0–0.1)
EOS%: 0.8 % (ref 0.0–7.0)
HGB: 14.2 g/dL (ref 11.6–15.9)
LYMPH%: 23.9 % (ref 14.0–49.7)
MCH: 31.7 pg (ref 25.1–34.0)
MCV: 91.3 fL (ref 79.5–101.0)
MONO%: 7.1 % (ref 0.0–14.0)
RDW: 13 % (ref 11.2–14.5)

## 2010-12-25 LAB — COMPREHENSIVE METABOLIC PANEL
ALT: 19 U/L (ref 0–35)
AST: 18 U/L (ref 0–37)
Albumin: 4.4 g/dL (ref 3.5–5.2)
Alkaline Phosphatase: 56 U/L (ref 39–117)
Potassium: 3.7 mEq/L (ref 3.5–5.3)
Sodium: 139 mEq/L (ref 135–145)
Total Protein: 6.7 g/dL (ref 6.0–8.3)

## 2010-12-25 LAB — VITAMIN D 25 HYDROXY (VIT D DEFICIENCY, FRACTURES): Vit D, 25-Hydroxy: 30 ng/mL (ref 30–89)

## 2010-12-30 ENCOUNTER — Ambulatory Visit (INDEPENDENT_AMBULATORY_CARE_PROVIDER_SITE_OTHER): Payer: No Typology Code available for payment source | Admitting: Psychiatry

## 2010-12-30 DIAGNOSIS — F988 Other specified behavioral and emotional disorders with onset usually occurring in childhood and adolescence: Secondary | ICD-10-CM

## 2010-12-30 DIAGNOSIS — F411 Generalized anxiety disorder: Secondary | ICD-10-CM

## 2011-01-06 ENCOUNTER — Ambulatory Visit (INDEPENDENT_AMBULATORY_CARE_PROVIDER_SITE_OTHER): Payer: No Typology Code available for payment source | Admitting: Psychiatry

## 2011-01-06 DIAGNOSIS — F988 Other specified behavioral and emotional disorders with onset usually occurring in childhood and adolescence: Secondary | ICD-10-CM

## 2011-01-06 DIAGNOSIS — F411 Generalized anxiety disorder: Secondary | ICD-10-CM

## 2011-01-13 ENCOUNTER — Ambulatory Visit (INDEPENDENT_AMBULATORY_CARE_PROVIDER_SITE_OTHER): Payer: No Typology Code available for payment source | Admitting: Psychiatry

## 2011-01-13 DIAGNOSIS — F988 Other specified behavioral and emotional disorders with onset usually occurring in childhood and adolescence: Secondary | ICD-10-CM

## 2011-01-13 DIAGNOSIS — F411 Generalized anxiety disorder: Secondary | ICD-10-CM

## 2011-01-20 ENCOUNTER — Ambulatory Visit (INDEPENDENT_AMBULATORY_CARE_PROVIDER_SITE_OTHER): Payer: No Typology Code available for payment source | Admitting: Psychiatry

## 2011-01-20 DIAGNOSIS — F411 Generalized anxiety disorder: Secondary | ICD-10-CM

## 2011-01-20 DIAGNOSIS — F988 Other specified behavioral and emotional disorders with onset usually occurring in childhood and adolescence: Secondary | ICD-10-CM

## 2011-01-27 ENCOUNTER — Ambulatory Visit (INDEPENDENT_AMBULATORY_CARE_PROVIDER_SITE_OTHER): Payer: No Typology Code available for payment source | Admitting: Psychiatry

## 2011-01-27 DIAGNOSIS — F988 Other specified behavioral and emotional disorders with onset usually occurring in childhood and adolescence: Secondary | ICD-10-CM

## 2011-01-27 DIAGNOSIS — F411 Generalized anxiety disorder: Secondary | ICD-10-CM

## 2011-02-03 ENCOUNTER — Ambulatory Visit: Payer: No Typology Code available for payment source | Admitting: Psychiatry

## 2011-02-10 ENCOUNTER — Ambulatory Visit (INDEPENDENT_AMBULATORY_CARE_PROVIDER_SITE_OTHER): Payer: No Typology Code available for payment source | Admitting: Psychiatry

## 2011-02-10 DIAGNOSIS — F411 Generalized anxiety disorder: Secondary | ICD-10-CM

## 2011-02-10 DIAGNOSIS — F988 Other specified behavioral and emotional disorders with onset usually occurring in childhood and adolescence: Secondary | ICD-10-CM

## 2011-02-17 ENCOUNTER — Ambulatory Visit (INDEPENDENT_AMBULATORY_CARE_PROVIDER_SITE_OTHER): Payer: No Typology Code available for payment source | Admitting: Psychiatry

## 2011-02-17 DIAGNOSIS — F988 Other specified behavioral and emotional disorders with onset usually occurring in childhood and adolescence: Secondary | ICD-10-CM

## 2011-02-17 DIAGNOSIS — F411 Generalized anxiety disorder: Secondary | ICD-10-CM

## 2011-02-24 ENCOUNTER — Ambulatory Visit (INDEPENDENT_AMBULATORY_CARE_PROVIDER_SITE_OTHER): Payer: PRIVATE HEALTH INSURANCE | Admitting: Psychiatry

## 2011-02-24 DIAGNOSIS — F411 Generalized anxiety disorder: Secondary | ICD-10-CM

## 2011-02-24 DIAGNOSIS — F909 Attention-deficit hyperactivity disorder, unspecified type: Secondary | ICD-10-CM

## 2011-03-03 ENCOUNTER — Ambulatory Visit (INDEPENDENT_AMBULATORY_CARE_PROVIDER_SITE_OTHER): Payer: PRIVATE HEALTH INSURANCE | Admitting: Psychiatry

## 2011-03-03 DIAGNOSIS — F988 Other specified behavioral and emotional disorders with onset usually occurring in childhood and adolescence: Secondary | ICD-10-CM

## 2011-03-03 DIAGNOSIS — F411 Generalized anxiety disorder: Secondary | ICD-10-CM

## 2011-03-10 ENCOUNTER — Ambulatory Visit (INDEPENDENT_AMBULATORY_CARE_PROVIDER_SITE_OTHER): Payer: No Typology Code available for payment source | Admitting: Psychiatry

## 2011-03-10 DIAGNOSIS — F411 Generalized anxiety disorder: Secondary | ICD-10-CM

## 2011-03-10 DIAGNOSIS — F988 Other specified behavioral and emotional disorders with onset usually occurring in childhood and adolescence: Secondary | ICD-10-CM

## 2011-03-12 LAB — COMPREHENSIVE METABOLIC PANEL
ALT: 22
AST: 24
Albumin: 4.4
Alkaline Phosphatase: 48
Chloride: 99
GFR calc Af Amer: >60
Potassium: 3.4 — ABNORMAL LOW
Total Bilirubin: 0.6

## 2011-03-12 LAB — DIFFERENTIAL
Basophils Absolute: 0.1
Basophils Relative: 1
Eosinophils Absolute: 0.1
Eosinophils Relative: 2
Monocytes Absolute: 0.5

## 2011-03-12 LAB — CBC
Platelets: 393
RBC: 4.75
WBC: 9.7

## 2011-03-13 LAB — BASIC METABOLIC PANEL
CO2: 26
Calcium: 9.2
Creatinine, Ser: 0.78
GFR calc non Af Amer: >60
Glucose, Bld: 118 — ABNORMAL HIGH
Sodium: 140

## 2011-03-17 ENCOUNTER — Ambulatory Visit (INDEPENDENT_AMBULATORY_CARE_PROVIDER_SITE_OTHER): Payer: No Typology Code available for payment source | Admitting: Psychiatry

## 2011-03-17 DIAGNOSIS — F988 Other specified behavioral and emotional disorders with onset usually occurring in childhood and adolescence: Secondary | ICD-10-CM

## 2011-03-17 DIAGNOSIS — F411 Generalized anxiety disorder: Secondary | ICD-10-CM

## 2011-03-24 ENCOUNTER — Ambulatory Visit (INDEPENDENT_AMBULATORY_CARE_PROVIDER_SITE_OTHER): Payer: No Typology Code available for payment source | Admitting: Psychiatry

## 2011-03-24 DIAGNOSIS — F411 Generalized anxiety disorder: Secondary | ICD-10-CM

## 2011-03-24 DIAGNOSIS — F988 Other specified behavioral and emotional disorders with onset usually occurring in childhood and adolescence: Secondary | ICD-10-CM

## 2011-03-31 ENCOUNTER — Ambulatory Visit: Payer: No Typology Code available for payment source | Admitting: Psychiatry

## 2011-04-07 ENCOUNTER — Ambulatory Visit: Payer: No Typology Code available for payment source | Admitting: Psychiatry

## 2011-04-14 ENCOUNTER — Ambulatory Visit (INDEPENDENT_AMBULATORY_CARE_PROVIDER_SITE_OTHER): Payer: No Typology Code available for payment source | Admitting: Psychiatry

## 2011-04-14 DIAGNOSIS — F411 Generalized anxiety disorder: Secondary | ICD-10-CM

## 2011-04-14 DIAGNOSIS — F988 Other specified behavioral and emotional disorders with onset usually occurring in childhood and adolescence: Secondary | ICD-10-CM

## 2011-04-21 ENCOUNTER — Ambulatory Visit (INDEPENDENT_AMBULATORY_CARE_PROVIDER_SITE_OTHER): Payer: No Typology Code available for payment source | Admitting: Psychiatry

## 2011-04-21 DIAGNOSIS — F411 Generalized anxiety disorder: Secondary | ICD-10-CM

## 2011-04-21 DIAGNOSIS — F988 Other specified behavioral and emotional disorders with onset usually occurring in childhood and adolescence: Secondary | ICD-10-CM

## 2011-04-28 ENCOUNTER — Ambulatory Visit: Payer: No Typology Code available for payment source | Admitting: Psychiatry

## 2011-05-04 ENCOUNTER — Telehealth: Payer: Self-pay | Admitting: *Deleted

## 2011-05-04 NOTE — Telephone Encounter (Signed)
GAVE PATIENT APPOINTMENT FOR 06-2011 

## 2011-05-05 ENCOUNTER — Ambulatory Visit (INDEPENDENT_AMBULATORY_CARE_PROVIDER_SITE_OTHER): Payer: No Typology Code available for payment source | Admitting: Psychiatry

## 2011-05-05 DIAGNOSIS — F988 Other specified behavioral and emotional disorders with onset usually occurring in childhood and adolescence: Secondary | ICD-10-CM

## 2011-05-05 DIAGNOSIS — F411 Generalized anxiety disorder: Secondary | ICD-10-CM

## 2011-05-06 ENCOUNTER — Encounter: Payer: Self-pay | Admitting: Psychiatry
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
## 2011-05-07 ENCOUNTER — Encounter: Payer: Self-pay | Admitting: *Deleted

## 2011-05-07 DIAGNOSIS — Z9889 Other specified postprocedural states: Secondary | ICD-10-CM | POA: Insufficient documentation

## 2011-05-08 ENCOUNTER — Other Ambulatory Visit: Payer: Self-pay | Admitting: *Deleted

## 2011-05-08 DIAGNOSIS — C50119 Malignant neoplasm of central portion of unspecified female breast: Secondary | ICD-10-CM

## 2011-05-08 MED ORDER — ALPRAZOLAM 1 MG PO TABS: ORAL_TABLET | ORAL | Status: DC

## 2011-05-08 NOTE — Telephone Encounter (Signed)
PRESCRIPTION REFILL WAS CALLED TO [facility].

## 2011-05-12 ENCOUNTER — Ambulatory Visit (INDEPENDENT_AMBULATORY_CARE_PROVIDER_SITE_OTHER): Payer: No Typology Code available for payment source | Admitting: Psychiatry

## 2011-05-12 DIAGNOSIS — F411 Generalized anxiety disorder: Secondary | ICD-10-CM

## 2011-05-12 DIAGNOSIS — F988 Other specified behavioral and emotional disorders with onset usually occurring in childhood and adolescence: Secondary | ICD-10-CM
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
## 2011-05-18 ENCOUNTER — Ambulatory Visit: Payer: No Typology Code available for payment source | Admitting: *Deleted

## 2011-05-19 ENCOUNTER — Ambulatory Visit (INDEPENDENT_AMBULATORY_CARE_PROVIDER_SITE_OTHER): Payer: No Typology Code available for payment source | Admitting: Psychiatry

## 2011-05-19 DIAGNOSIS — F411 Generalized anxiety disorder: Secondary | ICD-10-CM

## 2011-05-19 DIAGNOSIS — F988 Other specified behavioral and emotional disorders with onset usually occurring in childhood and adolescence: Secondary | ICD-10-CM
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
## 2011-05-22 ENCOUNTER — Encounter: Payer: Self-pay | Admitting: *Deleted

## 2011-05-22 ENCOUNTER — Encounter: Payer: PRIVATE HEALTH INSURANCE | Attending: Oncology | Admitting: *Deleted

## 2011-05-22 DIAGNOSIS — Z713 Dietary counseling and surveillance: Secondary | ICD-10-CM | POA: Insufficient documentation

## 2011-05-22 DIAGNOSIS — R630 Anorexia: Secondary | ICD-10-CM | POA: Insufficient documentation

## 2011-05-22 NOTE — Patient Instructions (Addendum)
Patient plans to:  Eat breakfast including fast choices of boiled eggs to room temperature, string cheese, PNB on toast or 1/2 bagel, Carnation Instant Breakfast in paper cup so can throw away when finished.  Lunch ideas: PNB or cheese crackers with fresh fruit or fruited yogurt  Supper: lean meat, 1 starch and 1/2 plate of vegetables  Read food labels for total carbohydrate and fat content of foods

## 2011-05-22 NOTE — Progress Notes (Signed)
  Medical Nutrition Therapy:  Appt start time: 1030 end time:  1130.   Assessment:  Primary concerns today: Patient is here today with concerns of adequate nutrient intake. She is under a lot of stress with work and school schedule, and has lost close family members recently. She skips breakfast  Lunch regularly due to limited time for preparation and then eats excessively at night. She is requesting guidance on health food choices.   MEDICATIONS: see list   DIETARY INTAKE:  Usual eating pattern includes 1-2 meals and 1-2 snacks per day.  Everyday foods include limited variety of most food groups.  Avoided foods include fast foods.    24-hr recall:  B ( AM): special K cereal with 1 oz milk or egg sandwich if she eats at all  Snk ( AM): occasional yogurt or nut and dried fruit mix  L ( PM): skips usually, may have sub sandwich or chix salad Snk ( PM): none D ( PM): if cooks at home, lean meat, starches and some vegetables Snk ( PM): chocolate candy Beverages: water, coffee with flavored cream, wine  Usual physical activity: no set exercise plan  Estimated energy needs: 1400 calories 158 g carbohydrates 105 g protein 39 g fat  Progress Towards Goal(s):  In progress.   Nutritional Diagnosis:  NI-5.11.1 Predicted suboptimal nutrient intake As related to feeling tired .  As evidenced by poor intake of adequate food groups and nutrients.    Intervention:  Nutrition counseling suggesting she include adequate nutrition for breakfast and lunch, then cut back on chocolate at night. Reviewed ways to prepare foods ahead of time when preparing supper in the evening, drinking Carnation Instant Breakfast for breakfast meal in paper cup so no clean up needed, simple items for lunch that provide balance of carb protei2n and fat, etc .  Handouts given during visit include:  Plate Method handout  Carb Counting and Beyond handout  Reading Food Labels  Monitoring/Evaluation:  Dietary intake,  eating 3 meals/day, and body weight prn.

## 2011-05-26 ENCOUNTER — Ambulatory Visit: Payer: No Typology Code available for payment source | Admitting: Psychiatry

## 2011-05-26 NOTE — Progress Notes (Signed)
Patient cancelled appointment for today at 4 because of overload of schoolwork.  Next appointment 06-02-2011.

## 2011-05-28 ENCOUNTER — Ambulatory Visit: Payer: No Typology Code available for payment source | Admitting: Radiation Oncology

## 2011-06-02 ENCOUNTER — Ambulatory Visit (INDEPENDENT_AMBULATORY_CARE_PROVIDER_SITE_OTHER): Payer: No Typology Code available for payment source | Admitting: Psychiatry

## 2011-06-02 DIAGNOSIS — F988 Other specified behavioral and emotional disorders with onset usually occurring in childhood and adolescence: Secondary | ICD-10-CM

## 2011-06-02 DIAGNOSIS — F411 Generalized anxiety disorder: Secondary | ICD-10-CM

## 2011-06-03 ENCOUNTER — Other Ambulatory Visit: Payer: Self-pay | Admitting: *Deleted

## 2011-06-03 DIAGNOSIS — C50119 Malignant neoplasm of central portion of unspecified female breast: Secondary | ICD-10-CM

## 2011-06-03 MED ORDER — ALPRAZOLAM 1 MG PO TABS: ORAL_TABLET | ORAL | Status: DC

## 2011-06-03 NOTE — Progress Notes (Signed)
Patient seen for individual psychotherapy.  Patient c/o feeling overwhelmed by school, finances, and approaching holidays.  She spoke with primary MD who discouraged her from taking any medications for her ADD symptoms.  Encouraged patient to have a quiet holiday and scale down costs (gifts and ski trip). Patient continues to ignore suggestions to care for self.

## 2011-06-05 ENCOUNTER — Encounter (INDEPENDENT_AMBULATORY_CARE_PROVIDER_SITE_OTHER): Payer: Self-pay

## 2011-06-09 ENCOUNTER — Ambulatory Visit (INDEPENDENT_AMBULATORY_CARE_PROVIDER_SITE_OTHER): Payer: No Typology Code available for payment source | Admitting: Psychiatry

## 2011-06-09 DIAGNOSIS — F988 Other specified behavioral and emotional disorders with onset usually occurring in childhood and adolescence: Secondary | ICD-10-CM

## 2011-06-09 DIAGNOSIS — F411 Generalized anxiety disorder: Secondary | ICD-10-CM

## 2011-06-10 NOTE — Progress Notes (Signed)
Patient seen for individual psychotherapy.  Patient struggling with financial issues, family conflicts and pressures from classes at college.  She continues to live an unbalanced life and refuses to make financial adjustments that would put her finances in the black.  Patient was advised by Ob/Gyn to have another vulvar biopsy and an MRI of her lower abdomen to r/o pathology related to lower abdominal swelling and discomfort. Next appointment 06-30-2011.

## 2011-06-11 ENCOUNTER — Other Ambulatory Visit: Payer: Self-pay | Admitting: Gynecology

## 2011-06-30 ENCOUNTER — Ambulatory Visit (INDEPENDENT_AMBULATORY_CARE_PROVIDER_SITE_OTHER): Payer: No Typology Code available for payment source | Admitting: Psychiatry

## 2011-06-30 DIAGNOSIS — F988 Other specified behavioral and emotional disorders with onset usually occurring in childhood and adolescence: Secondary | ICD-10-CM

## 2011-06-30 DIAGNOSIS — D071 Carcinoma in situ of vulva: Secondary | ICD-10-CM | POA: Insufficient documentation

## 2011-06-30 DIAGNOSIS — F440 Dissociative amnesia: Secondary | ICD-10-CM

## 2011-07-01 NOTE — Progress Notes (Unsigned)
Patient seen for individual psychotherapy.  She has been diagnosed with vulvar cancer and will need surgery. She continues to do for others versus for herself with negative consequences.  Patient agreed to check into joining bowling league for some r & r.  Next appointment 07-07-2011.

## 2011-07-06 ENCOUNTER — Ambulatory Visit (HOSPITAL_COMMUNITY)
Admission: RE | Admit: 2011-07-06 | Discharge: 2011-07-06 | Disposition: A | Discharge status: Home / Self Care | Payer: PRIVATE HEALTH INSURANCE | Source: Ambulatory Visit | Attending: Physician Assistant | Admitting: Physician Assistant

## 2011-07-06 ENCOUNTER — Other Ambulatory Visit: Admitting: Lab

## 2011-07-06 ENCOUNTER — Other Ambulatory Visit: Payer: Self-pay

## 2011-07-06 ENCOUNTER — Telehealth: Payer: Self-pay | Admitting: Oncology

## 2011-07-06 ENCOUNTER — Ambulatory Visit (HOSPITAL_BASED_OUTPATIENT_CLINIC_OR_DEPARTMENT_OTHER): Admitting: Physician Assistant

## 2011-07-06 ENCOUNTER — Other Ambulatory Visit: Payer: Self-pay | Admitting: Physician Assistant

## 2011-07-06 VITALS — BP 135/84 | HR 97 | Temp 98.6°F | Ht 69.5 in | Wt 193.5 lb

## 2011-07-06 DIAGNOSIS — Z170 Estrogen receptor positive status [ER+]: Secondary | ICD-10-CM

## 2011-07-06 DIAGNOSIS — M545 Low back pain, unspecified: Secondary | ICD-10-CM | POA: Insufficient documentation

## 2011-07-06 DIAGNOSIS — I10 Essential (primary) hypertension: Secondary | ICD-10-CM

## 2011-07-06 DIAGNOSIS — F411 Generalized anxiety disorder: Secondary | ICD-10-CM

## 2011-07-06 DIAGNOSIS — C50919 Malignant neoplasm of unspecified site of unspecified female breast: Secondary | ICD-10-CM

## 2011-07-06 DIAGNOSIS — M51379 Other intervertebral disc degeneration, lumbosacral region without mention of lumbar back pain or lower extremity pain: Secondary | ICD-10-CM | POA: Insufficient documentation

## 2011-07-06 DIAGNOSIS — M549 Dorsalgia, unspecified: Secondary | ICD-10-CM

## 2011-07-06 DIAGNOSIS — F419 Anxiety disorder, unspecified: Secondary | ICD-10-CM

## 2011-07-06 DIAGNOSIS — C50119 Malignant neoplasm of central portion of unspecified female breast: Secondary | ICD-10-CM

## 2011-07-06 DIAGNOSIS — Z853 Personal history of malignant neoplasm of breast: Secondary | ICD-10-CM | POA: Insufficient documentation

## 2011-07-06 DIAGNOSIS — M546 Pain in thoracic spine: Secondary | ICD-10-CM | POA: Insufficient documentation

## 2011-07-06 DIAGNOSIS — M5137 Other intervertebral disc degeneration, lumbosacral region: Secondary | ICD-10-CM | POA: Insufficient documentation

## 2011-07-06 LAB — CBC WITH DIFFERENTIAL/PLATELET
BASO%: 0.8 % (ref 0.0–2.0)
EOS%: 1.1 % (ref 0.0–7.0)
HCT: 42.4 % (ref 34.8–46.6)
LYMPH%: 21.7 % (ref 14.0–49.7)
MCH: 32.5 pg (ref 25.1–34.0)
MCHC: 34.9 g/dL (ref 31.5–36.0)
MCV: 93.3 fL (ref 79.5–101.0)
MONO%: 6.8 % (ref 0.0–14.0)
NEUT%: 69.6 % (ref 38.4–76.8)
Platelets: 357 10*3/uL (ref 145–400)
RBC: 4.54 10*6/uL (ref 3.70–5.45)

## 2011-07-06 MED ORDER — HYDROCHLOROTHIAZIDE 12.5 MG PO CAPS: 25.0000 mg | ORAL_CAPSULE | Freq: Every day | ORAL | Status: DC

## 2011-07-06 MED ORDER — LETROZOLE 2.5 MG PO TABS: 2.5000 mg | ORAL_TABLET | Freq: Every day | ORAL | Status: DC

## 2011-07-06 MED ORDER — ALPRAZOLAM 1 MG PO TABS: ORAL_TABLET | ORAL | Status: DC

## 2011-07-06 NOTE — Progress Notes (Signed)
Hematology and Oncology Follow Up Visit  Madison West [id number] 28-Mar-1956 56 y.o. 07/06/2011    HPI:    A 56 year old [place], [place] woman with a history of a node positive, ER PR positive right breast carcinoma S/P right lumpectomy with sentinel node dissection followed by 4 cycles of q. 3 week Taxotere/Cytoxan in the adjuvant setting, radiation under the care Dr. Kathrynn Running completed August 2009 on Femara 2.5 mg p.o. q. day since.  Interim History:       And Stanton Kidney returns today for follow pertained to her history of a multi-no positive, ER/PR positive right breast carcinoma. She continues on Femara 2.5 mg by mouth daily. Overall she feels well physically except that she is having back pain which is a new symptom. She knows she continues to do quite a bit of stress and is followed by Dr. Enzo Bi on a regular basis. She denies any nausea emesis diarrhea or constipation issues. If unexplained fevers chills or night sweats. She denies any bleeding or bruising symptoms. Of note she had a mammogram performed in December at [place] and all was well.  A detailed review of systems is otherwise noncontributory as noted below.  Review of Systems: Constitutional:  no weight loss, fever, night sweats but admits that she is under quite abit of stress. Eyes: No complaints ENT:No complaints Cardiovascular: no chest pain or dyspnea on exertion Respiratory: no cough, shortness of breath, or wheezing Neurological: no TIA or stroke symptoms Dermatological: negative Gastrointestinal: no abdominal pain, change in bowel habits, or black or bloody stools Genito-Urinary: positive for - vulvar/vaginal symptoms, work up in progress for squamous call ca. of vulvar region Hematological and Lymphatic: negative Breast: negative for breast lumps Musculoskeletal: positive for - pain in back - lower Remaining ROS negative.   Medications:   I have reviewed the patient's current  medications.  Current Outpatient Prescriptions  Medication Sig Dispense Refill  . ALPRAZolam (XANAX) 1 MG tablet TAKE ONE TO TWO TABS EVERY SIX TO EIGHT HOURS PRN ANXIETY  90 tablet  0  . Calcium Carbonate-Vitamin D (CALCIUM + D PO) Take 600 mg by mouth daily.        . hydrochlorothiazide (MICROZIDE) 12.5 MG capsule Take 2 capsules (25 mg total) by mouth daily.  30 capsule  3  . letrozole (FEMARA) 2.5 MG tablet Take 1 tablet (2.5 mg total) by mouth daily.  30 tablet  11  . oxyCODONE-acetaminophen (PERCOCET) 5-325 MG per tablet Take 1 tablet by mouth as needed.          Allergies: No Known Allergies  Physical Exam: Filed Vitals:   07/06/11 1139  BP: 135/84  Pulse: 97  Temp: 98.6 F (37 C)   HEENT:  Sclerae anicteric, conjunctivae pink.  Oropharynx clear.  No mucositis or candidiasis.   Nodes:  No cervical, supraclavicular, or axillary lymphadenopathy palpated.  Breast Exam: The right breast was examined, prior lumpectomy scar has some expected thickening but is otherwise benign, remaining breast tissue is free of any palpable masses. No frank nipple inversion or discharge appreciated no axillary fullness or lymphadenopathy.  The left breast was examined, it is free of any nipple inversion discharge no axillary fullness or lymphadenopathy no dominant mass effect. Lungs:  Clear to auscultation bilaterally.  No crackles, rhonchi, or wheezes. Patient denies a vertebral tenderness in the upper thoracic spine region, but she does have some thickening noted about the lower aspect of the thoracic spine. No bleeding or bruising noted.  Heart:  Regular rate and rhythm.   Abdomen:  Soft, nontender.  Positive bowel sounds.  No organomegaly or masses palpated.   Musculoskeletal:  No focal spinal tenderness to palpation.  Extremities:  Benign.  No peripheral edema or cyanosis.   Skin:  Benign.   Neuro:  Nonfocal, alert and oriented x 3   Lab Results: Lab Results  Component Value Date   WBC 7.1  07/06/2011   HGB 14.8 07/06/2011   HCT 42.4 07/06/2011   MCV 93.3 07/06/2011   PLT 357 07/06/2011   NEUTROABS 5.0 07/06/2011     Chemistry      Component Value Date/Time   NA 139 12/25/2010 1146   NA 139 12/25/2010 1146   K 3.7 12/25/2010 1146   K 3.7 12/25/2010 1146   CL 101 12/25/2010 1146   CL 101 12/25/2010 1146   CO2 24 12/25/2010 1146   CO2 24 12/25/2010 1146   BUN 11 12/25/2010 1146   BUN 11 12/25/2010 1146   CREATININE 0.71 12/25/2010 1146   CREATININE 0.71 12/25/2010 1146      Component Value Date/Time   CALCIUM 9.4 12/25/2010 1146   CALCIUM 9.4 12/25/2010 1146   ALKPHOS 56 12/25/2010 1146   ALKPHOS 56 12/25/2010 1146   AST 18 12/25/2010 1146   AST 18 12/25/2010 1146   ALT 19 12/25/2010 1146   ALT 19 12/25/2010 1146   BILITOT 0.4 12/25/2010 1146   BILITOT 0.4 12/25/2010 1146        Radiological Studies:   Mammogram performed at [facility] on 05/25/2011 shows no evidence of abnormality.   Assessment:  #56.   57- A year-old [place], [place] woman with a history of stage IIB ER PR positive right breast carcinoma S/P right lumpectomy with axillary node dissection followed by 4 cycles of q. 3-week TC in the adjuvant setting followed by radiation completed August 2009 on Femara 2.5 mg p.o. q. day since. #2.Squamous cell carcinoma of the vulva S/P resection by Dr. Nelly Laurence.    In case reviewed with Dr. Pierce Crane.   Plan:    Per patient request I have refilled her Xanax and Percocet. Of note I will also sent her for plain film of the thoracic and lumbar spine region. I recommended that she followup with Dr. Nelly Laurence later today as scheduled. She will continue on Femara 2.5 mg by mouth daily plan followup at a 6 month interval but sooner if the nature eyes.  This plan was reviewed with the patient, who voices understanding and agreement.  She knows to call with any changes or problems.    Jasmine Maceachern T, PA-C 07/06/2011

## 2011-07-06 NOTE — Telephone Encounter (Signed)
gve the pt her July 2013 appt calendar along with the plain film x-ray of the t-l spine

## 2011-07-07 ENCOUNTER — Other Ambulatory Visit: Payer: Self-pay | Admitting: Medical Oncology

## 2011-07-07 ENCOUNTER — Ambulatory Visit (INDEPENDENT_AMBULATORY_CARE_PROVIDER_SITE_OTHER): Payer: No Typology Code available for payment source | Admitting: Psychiatry

## 2011-07-07 DIAGNOSIS — I10 Essential (primary) hypertension: Secondary | ICD-10-CM

## 2011-07-07 DIAGNOSIS — F411 Generalized anxiety disorder: Secondary | ICD-10-CM

## 2011-07-07 DIAGNOSIS — F988 Other specified behavioral and emotional disorders with onset usually occurring in childhood and adolescence: Secondary | ICD-10-CM

## 2011-07-07 LAB — COMPREHENSIVE METABOLIC PANEL
ALT: 13 U/L (ref 0–35)
AST: 17 U/L (ref 0–37)
Alkaline Phosphatase: 54 U/L (ref 39–117)
CO2: 28 mEq/L (ref 19–32)
Creatinine, Ser: 0.7 mg/dL (ref 0.50–1.10)
Sodium: 136 mEq/L (ref 135–145)
Total Bilirubin: 0.5 mg/dL (ref 0.3–1.2)
Total Protein: 6.7 g/dL (ref 6.0–8.3)

## 2011-07-07 LAB — VITAMIN D 25 HYDROXY (VIT D DEFICIENCY, FRACTURES): Vit D, 25-Hydroxy: 28 ng/mL — ABNORMAL LOW (ref 30–89)

## 2011-07-07 LAB — LACTATE DEHYDROGENASE: LDH: 145 U/L (ref 94–250)

## 2011-07-07 MED ORDER — HYDROCHLOROTHIAZIDE 12.5 MG PO CAPS: 25.0000 mg | ORAL_CAPSULE | Freq: Every day | ORAL | Status: DC

## 2011-07-07 NOTE — Telephone Encounter (Signed)
Received a note from [facility] stating that pt has been taking hydrochlorothiazide 25 mg daily.  The prescription that was sent in was 12.5 mg to take 2 tabs daily. I called pharmacist to let them know the only selection we have on our escribe is 12.5mg  so that is why and we put to take 2 tabs po daily. I called in the 25 mg for a total of 60 tabs.

## 2011-07-08 ENCOUNTER — Telehealth: Payer: Self-pay | Admitting: *Deleted

## 2011-07-08 NOTE — Progress Notes (Signed)
Patient seen for individual psychotherapy.  She continues to struggle with ADD symptoms but refuses to take medication for this disorder.  Session focused on coping skills and encouraging patient to set limits with daughters and to nurture self.  New diagnosis:  Dependent Personality Disorder. Next appointment 07-14-2011.

## 2011-07-08 NOTE — Telephone Encounter (Signed)
Reviewed 07/06/11 xrays with Debbora Presto PA.  Called pt and let her know that it does not show cancer, but she does have degenerative changes /arthritis and that her PCP could see her for this.  Faxed reports to [facility] [phone] per her request.

## 2011-07-14 ENCOUNTER — Ambulatory Visit (INDEPENDENT_AMBULATORY_CARE_PROVIDER_SITE_OTHER): Payer: No Typology Code available for payment source | Admitting: Psychiatry

## 2011-07-14 DIAGNOSIS — F411 Generalized anxiety disorder: Secondary | ICD-10-CM

## 2011-07-14 DIAGNOSIS — F988 Other specified behavioral and emotional disorders with onset usually occurring in childhood and adolescence: Secondary | ICD-10-CM
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
## 2011-07-21 ENCOUNTER — Ambulatory Visit (INDEPENDENT_AMBULATORY_CARE_PROVIDER_SITE_OTHER): Payer: No Typology Code available for payment source | Admitting: Psychiatry

## 2011-07-21 DIAGNOSIS — F411 Generalized anxiety disorder: Secondary | ICD-10-CM

## 2011-07-21 DIAGNOSIS — F988 Other specified behavioral and emotional disorders with onset usually occurring in childhood and adolescence: Secondary | ICD-10-CM

## 2011-07-22 NOTE — Progress Notes (Signed)
07-21-2011  Patient seen for individual psychotherapy.  She continues to struggle with issues of codependency with her daughters, financial stressors due to poor money management, and problems with classes in college due to her untreated ADD. Encouraged patient to make small changes if the major ones are too big for her to attempt.  Patient postponed her cancer surgery to 08-15-2011. Next appointment 07-28-2011.

## 2011-07-28 ENCOUNTER — Ambulatory Visit (INDEPENDENT_AMBULATORY_CARE_PROVIDER_SITE_OTHER): Payer: No Typology Code available for payment source | Admitting: Psychiatry

## 2011-07-28 DIAGNOSIS — F411 Generalized anxiety disorder: Secondary | ICD-10-CM

## 2011-07-28 DIAGNOSIS — Z638 Other specified problems related to primary support group: Secondary | ICD-10-CM

## 2011-07-28 DIAGNOSIS — F988 Other specified behavioral and emotional disorders with onset usually occurring in childhood and adolescence: Secondary | ICD-10-CM

## 2011-07-29 NOTE — Progress Notes (Signed)
07-28-2011  Patient seen for individual psychotherapy.  She and daughter continue to have conflictual relationship and patient refuses to set limits with her.  Encouraged her to make small changes and to self-nurture.  Session focused on family conflicts and issues.  Next appointment 08-11-2011.

## 2011-08-02 ENCOUNTER — Other Ambulatory Visit: Payer: Self-pay | Admitting: Physician Assistant

## 2011-08-04 ENCOUNTER — Ambulatory Visit (INDEPENDENT_AMBULATORY_CARE_PROVIDER_SITE_OTHER): Payer: No Typology Code available for payment source | Admitting: Psychiatry

## 2011-08-04 DIAGNOSIS — F988 Other specified behavioral and emotional disorders with onset usually occurring in childhood and adolescence: Secondary | ICD-10-CM

## 2011-08-04 DIAGNOSIS — F411 Generalized anxiety disorder: Secondary | ICD-10-CM
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
## 2011-08-11 ENCOUNTER — Ambulatory Visit: Payer: No Typology Code available for payment source | Admitting: Psychiatry

## 2011-08-15 DIAGNOSIS — Z87412 Personal history of vulvar dysplasia: Secondary | ICD-10-CM | POA: Insufficient documentation

## 2011-08-25 ENCOUNTER — Ambulatory Visit: Payer: No Typology Code available for payment source | Admitting: Psychiatry

## 2011-08-25 NOTE — Progress Notes (Signed)
08-25-2011  Patient cancelled today's appointment because her daughter, Baird Lyons, has a severe migraine headache and needs to be seen at the ER. Next appointment 09-01-2011.

## 2011-09-01 ENCOUNTER — Ambulatory Visit (INDEPENDENT_AMBULATORY_CARE_PROVIDER_SITE_OTHER): Payer: No Typology Code available for payment source | Admitting: Psychiatry

## 2011-09-01 DIAGNOSIS — F988 Other specified behavioral and emotional disorders with onset usually occurring in childhood and adolescence: Secondary | ICD-10-CM

## 2011-09-01 DIAGNOSIS — Z638 Other specified problems related to primary support group: Secondary | ICD-10-CM

## 2011-09-01 DIAGNOSIS — F411 Generalized anxiety disorder: Secondary | ICD-10-CM

## 2011-09-02 NOTE — Progress Notes (Signed)
09-01-2011  Patient seen for individual psychotherapy.  Patient's mood and affect improved after taking a short ski vacation with her daughter, Toni Amend, and a friend, Senaida Ores.  Encouraged her to plan another trip in the near future to keep herself nurtured.  Her surgery went well and she is recovering.  She still struggles with school because of untreated ADD but refuses meds.  Patient drank 2 glasses of wine and 7 beers one night on the vacation.  Cautioned her about alcohol abuse which she denies.  She does acknowledge she "used to drink way too much" but insists it's not a problem now. Explained how tolerance never goes away.  Next appointment 09-08-2011.

## 2011-09-08 ENCOUNTER — Ambulatory Visit: Payer: No Typology Code available for payment source | Admitting: Psychiatry

## 2011-09-14 ENCOUNTER — Encounter: Payer: Self-pay | Admitting: Psychiatry

## 2011-09-14 NOTE — Progress Notes (Signed)
09-14-2011  Patient cancelled appointment for 09-21-11 because of a death in the family.  Next appointment 09-22-2011.

## 2011-09-15 ENCOUNTER — Ambulatory Visit: Payer: No Typology Code available for payment source | Admitting: Psychiatry

## 2011-09-22 ENCOUNTER — Ambulatory Visit (INDEPENDENT_AMBULATORY_CARE_PROVIDER_SITE_OTHER): Payer: No Typology Code available for payment source | Admitting: Psychiatry

## 2011-09-22 DIAGNOSIS — F988 Other specified behavioral and emotional disorders with onset usually occurring in childhood and adolescence: Secondary | ICD-10-CM

## 2011-09-22 DIAGNOSIS — Z638 Other specified problems related to primary support group: Secondary | ICD-10-CM

## 2011-09-22 DIAGNOSIS — F411 Generalized anxiety disorder: Secondary | ICD-10-CM

## 2011-09-29 ENCOUNTER — Ambulatory Visit (INDEPENDENT_AMBULATORY_CARE_PROVIDER_SITE_OTHER): Payer: No Typology Code available for payment source | Admitting: Psychiatry

## 2011-09-29 DIAGNOSIS — F988 Other specified behavioral and emotional disorders with onset usually occurring in childhood and adolescence: Secondary | ICD-10-CM

## 2011-09-29 DIAGNOSIS — Z638 Other specified problems related to primary support group: Secondary | ICD-10-CM

## 2011-09-29 DIAGNOSIS — F411 Generalized anxiety disorder: Secondary | ICD-10-CM

## 2011-10-04 ENCOUNTER — Encounter (HOSPITAL_BASED_OUTPATIENT_CLINIC_OR_DEPARTMENT_OTHER): Payer: Self-pay | Admitting: *Deleted

## 2011-10-04 ENCOUNTER — Inpatient Hospital Stay (HOSPITAL_BASED_OUTPATIENT_CLINIC_OR_DEPARTMENT_OTHER)
Admission: EM | Admit: 2011-10-04 | Discharge: 2011-10-11 | DRG: 872 | Disposition: A | Discharge status: Home / Self Care | Payer: PRIVATE HEALTH INSURANCE | Attending: Family Medicine | Admitting: Family Medicine

## 2011-10-04 DIAGNOSIS — E876 Hypokalemia: Secondary | ICD-10-CM | POA: Diagnosis present

## 2011-10-04 DIAGNOSIS — Z9889 Other specified postprocedural states: Secondary | ICD-10-CM

## 2011-10-04 DIAGNOSIS — A419 Sepsis, unspecified organism: Principal | ICD-10-CM | POA: Diagnosis present

## 2011-10-04 DIAGNOSIS — F3289 Other specified depressive episodes: Secondary | ICD-10-CM | POA: Diagnosis present

## 2011-10-04 DIAGNOSIS — F329 Major depressive disorder, single episode, unspecified: Secondary | ICD-10-CM | POA: Diagnosis present

## 2011-10-04 DIAGNOSIS — Z853 Personal history of malignant neoplasm of breast: Secondary | ICD-10-CM

## 2011-10-04 DIAGNOSIS — C50119 Malignant neoplasm of central portion of unspecified female breast: Secondary | ICD-10-CM

## 2011-10-04 DIAGNOSIS — L0390 Cellulitis, unspecified: Secondary | ICD-10-CM

## 2011-10-04 DIAGNOSIS — Z86718 Personal history of other venous thrombosis and embolism: Secondary | ICD-10-CM

## 2011-10-04 DIAGNOSIS — F419 Anxiety disorder, unspecified: Secondary | ICD-10-CM

## 2011-10-04 DIAGNOSIS — L0291 Cutaneous abscess, unspecified: Secondary | ICD-10-CM

## 2011-10-04 DIAGNOSIS — F411 Generalized anxiety disorder: Secondary | ICD-10-CM | POA: Diagnosis present

## 2011-10-04 DIAGNOSIS — F32A Depression, unspecified: Secondary | ICD-10-CM

## 2011-10-04 DIAGNOSIS — I10 Essential (primary) hypertension: Secondary | ICD-10-CM

## 2011-10-04 DIAGNOSIS — N61 Mastitis without abscess: Secondary | ICD-10-CM

## 2011-10-04 LAB — DIFFERENTIAL
Basophils Absolute: 0 10*3/uL (ref 0.0–0.1)
Basophils Relative: 0 % (ref 0–1)
Eosinophils Absolute: 0 10*3/uL (ref 0.0–0.7)
Monocytes Absolute: 0.9 10*3/uL (ref 0.1–1.0)
Neutro Abs: 22.3 10*3/uL — ABNORMAL HIGH (ref 1.7–7.7)
Neutrophils Relative %: 92 % — ABNORMAL HIGH (ref 43–77)

## 2011-10-04 LAB — BASIC METABOLIC PANEL
Calcium: 9.4 mg/dL (ref 8.4–10.5)
Chloride: 96 mEq/L (ref 96–112)
Creatinine, Ser: 0.7 mg/dL (ref 0.50–1.10)
GFR calc Af Amer: >90 mL/min (ref >90)
GFR calc non Af Amer: >90 mL/min (ref >90)

## 2011-10-04 LAB — CBC
HCT: 42.5 % (ref 36.0–46.0)
MCH: 31.8 pg (ref 26.0–34.0)
MCHC: 35.5 g/dL (ref 30.0–36.0)
RDW: 12.8 % (ref 11.5–15.5)

## 2011-10-04 MED ORDER — POTASSIUM CHLORIDE IN NACL 20-0.9 MEQ/L-% IV SOLN
INTRAVENOUS | Status: DC
  Administered 2011-10-04: via INTRAVENOUS
  Filled 2011-10-04 (×2): qty 1000

## 2011-10-04 MED ORDER — SODIUM CHLORIDE 0.9 % IV BOLUS (SEPSIS)
1000.0000 mL | Freq: Once | INTRAVENOUS | Status: AC
  Administered 2011-10-04: 1000 mL via INTRAVENOUS

## 2011-10-04 MED ORDER — ALUM & MAG HYDROXIDE-SIMETH 200-200-20 MG/5ML PO SUSP: 30.0000 mL | Freq: Four times a day (QID) | ORAL | Status: DC | PRN

## 2011-10-04 MED ORDER — ACETAMINOPHEN 650 MG RE SUPP: 650.0000 mg | Freq: Four times a day (QID) | RECTAL | Status: DC | PRN

## 2011-10-04 MED ORDER — ONDANSETRON HCL 4 MG/2ML IJ SOLN: 4.0000 mg | Freq: Four times a day (QID) | INTRAMUSCULAR | Status: DC | PRN

## 2011-10-04 MED ORDER — ONDANSETRON HCL 4 MG/2ML IJ SOLN
4.0000 mg | Freq: Once | INTRAMUSCULAR | Status: AC
  Administered 2011-10-04: 4 mg via INTRAVENOUS
  Filled 2011-10-04: qty 2

## 2011-10-04 MED ORDER — ONDANSETRON HCL 4 MG PO TABS: 4.0000 mg | ORAL_TABLET | Freq: Four times a day (QID) | ORAL | Status: DC | PRN

## 2011-10-04 MED ORDER — SODIUM CHLORIDE 0.9 % IV SOLN: INTRAVENOUS | Status: DC

## 2011-10-04 MED ORDER — ACETAMINOPHEN 325 MG PO TABS
650.0000 mg | ORAL_TABLET | Freq: Four times a day (QID) | ORAL | Status: DC | PRN
  Administered 2011-10-04: 325 mg via ORAL
  Filled 2011-10-04: qty 1

## 2011-10-04 MED ORDER — VANCOMYCIN HCL 500 MG IV SOLR
INTRAVENOUS | Status: AC
  Filled 2011-10-04: qty 1500

## 2011-10-04 MED ORDER — PIPERACILLIN-TAZOBACTAM 4.5 G IVPB
4.5000 g | Freq: Once | INTRAVENOUS | Status: DC
  Filled 2011-10-04: qty 100

## 2011-10-04 MED ORDER — LETROZOLE 2.5 MG PO TABS
2.5000 mg | ORAL_TABLET | Freq: Every day | ORAL | Status: DC
  Administered 2011-10-05 – 2011-10-11 (×7): 2.5 mg via ORAL
  Filled 2011-10-04 (×7): qty 1

## 2011-10-04 MED ORDER — OXYCODONE-ACETAMINOPHEN 5-325 MG PO TABS
1.0000 | ORAL_TABLET | ORAL | Status: DC | PRN
  Administered 2011-10-04 – 2011-10-05 (×2): 1 via ORAL
  Filled 2011-10-04 (×2): qty 1

## 2011-10-04 MED ORDER — PIPERACILLIN-TAZOBACTAM 3.375 G IVPB 30 MIN
3.3750 g | Freq: Once | INTRAVENOUS | Status: DC
  Filled 2011-10-04: qty 50

## 2011-10-04 MED ORDER — ACETAMINOPHEN 325 MG PO TABS
650.0000 mg | ORAL_TABLET | Freq: Once | ORAL | Status: AC
  Administered 2011-10-04: 650 mg via ORAL
  Filled 2011-10-04: qty 2

## 2011-10-04 MED ORDER — PIPERACILLIN-TAZOBACTAM 3.375 G IVPB
3.3750 g | Freq: Once | INTRAVENOUS | Status: AC
  Administered 2011-10-04: 3.375 g via INTRAVENOUS
  Filled 2011-10-04: qty 50

## 2011-10-04 MED ORDER — ENOXAPARIN SODIUM 40 MG/0.4ML ~~LOC~~ SOLN
40.0000 mg | SUBCUTANEOUS | Status: DC
  Administered 2011-10-05 – 2011-10-11 (×7): 40 mg via SUBCUTANEOUS
  Filled 2011-10-04 (×7): qty 0.4

## 2011-10-04 MED ORDER — MORPHINE SULFATE 4 MG/ML IJ SOLN
4.0000 mg | Freq: Once | INTRAMUSCULAR | Status: AC
  Administered 2011-10-04: 4 mg via INTRAVENOUS
  Filled 2011-10-04: qty 1

## 2011-10-04 MED ORDER — PIPERACILLIN-TAZOBACTAM 3.375 G IVPB
3.3750 g | Freq: Three times a day (TID) | INTRAVENOUS | Status: DC
  Administered 2011-10-05 (×2): 3.375 g via INTRAVENOUS
  Filled 2011-10-04 (×3): qty 50

## 2011-10-04 MED ORDER — ALPRAZOLAM 0.5 MG PO TABS
1.0000 mg | ORAL_TABLET | Freq: Three times a day (TID) | ORAL | Status: DC | PRN
  Administered 2011-10-04 – 2011-10-11 (×18): 1 mg via ORAL
  Filled 2011-10-04 (×3): qty 2
  Filled 2011-10-04: qty 1
  Filled 2011-10-04 (×2): qty 2
  Filled 2011-10-04: qty 1
  Filled 2011-10-04 (×5): qty 2
  Filled 2011-10-04: qty 1
  Filled 2011-10-04: qty 2
  Filled 2011-10-04 (×3): qty 1
  Filled 2011-10-04 (×2): qty 2
  Filled 2011-10-04 (×2): qty 1
  Filled 2011-10-04 (×2): qty 2

## 2011-10-04 MED ORDER — VANCOMYCIN HCL 1000 MG IV SOLR
1250.0000 mg | Freq: Once | INTRAVENOUS | Status: AC
  Administered 2011-10-04: 1250 mg via INTRAVENOUS
  Filled 2011-10-04: qty 1250

## 2011-10-04 MED ORDER — VANCOMYCIN HCL IN DEXTROSE 1-5 GM/200ML-% IV SOLN
1000.0000 mg | Freq: Two times a day (BID) | INTRAVENOUS | Status: DC
  Administered 2011-10-05 – 2011-10-06 (×3): 1000 mg via INTRAVENOUS
  Filled 2011-10-04 (×3): qty 200

## 2011-10-04 MED ORDER — MORPHINE SULFATE 2 MG/ML IJ SOLN: 2.0000 mg | INTRAMUSCULAR | Status: DC | PRN

## 2011-10-04 MED ORDER — HYDROCHLOROTHIAZIDE 25 MG PO TABS
25.0000 mg | ORAL_TABLET | Freq: Every day | ORAL | Status: DC
  Administered 2011-10-05: 25 mg via ORAL
  Filled 2011-10-04 (×2): qty 1

## 2011-10-04 NOTE — ED Provider Notes (Signed)
History     CSN: [id number]  Arrival date & time 10/04/11  1554   First MD Initiated Contact with Patient 10/04/11 1616      Chief Complaint  Patient presents with  . Breast Pain    (Consider location/radiation/quality/duration/timing/severity/associated sxs/prior treatment) HPI  H/o breast ca s/p lumpectomy/chemo/radiation in remission since 2009 pw R breast pain x 2 days. States that pain is currently 8/10. Feels diffuse body aches. C/O fever to 102 prior to arrival. Denies numbness/tingling/weakness of extremities.  H/o cellulitis Rt breast x 3 in past similar to today  ED Notes, ED Provider Notes from 10/04/11 0000 to 10/04/11 16:08:31       York Cerise, RN 10/04/2011 16:06      Pt states she is a breast cancer survivor and has had cellulitis of the right breast three times. Fever 102 at home. States breast is red, tender and swollen.     Past Medical History  Diagnosis Date  . Anxiety   . Depression   . Hypertension   . Breast cancer 06/02/07    r breast  . S/P breast lumpectomy 07/04/07    R breast  . S/P radiation therapy 2009    History reviewed. No pertinent past surgical history.  Family History  Problem Relation Age of Onset  . Cancer Mother     colon  . Heart disease Father     heart attack  . Cancer Brother     colon, 2 brothers    History  Substance Use Topics  . Smoking status: Former Smoker    Quit date: 05/06/1997  . Smokeless tobacco: Never Used  . Alcohol Use: Yes     2 daily    OB History    Grav Para Term Preterm Abortions TAB SAB Ect Mult Living                  Review of Systems  All other systems reviewed and are negative.  except as noted HPI   Allergies  Review of patient's allergies indicates no known allergies.  Home Medications   Current Outpatient Rx  Name Route Sig Dispense Refill  . ALPRAZOLAM 1 MG PO TABS  TAKE ONE TO TWO TABS EVERY SIX TO EIGHT HOURS PRN ANXIETY 90 tablet 0  . CALCIUM + D PO Oral Take  600 mg by mouth daily.      Marland Kitchen HYDROCHLOROTHIAZIDE 25 MG PO TABS  TAKE 1 TABLET BY MOUTH EVERY DAY 30 tablet 4  . LETROZOLE 2.5 MG PO TABS  TAKE 1 TABLET BY MOUTH EVERY DAY 30 tablet 11  . OXYCODONE-ACETAMINOPHEN 5-325 MG PO TABS Oral Take 1 tablet by mouth as needed.        BP 165/103  Pulse 118  Temp(Src) 99.1 F (37.3 C) (Oral)  Resp 20  Ht 5\' 9"  (1.753 m)  Wt 185 lb (83.915 kg)  BMI 27.32 kg/m2  SpO2 95%  Physical Exam  Nursing note and vitals reviewed. Constitutional: She is oriented to person, place, and time. She appears well-developed.  HENT:  Head: Atraumatic.  Mouth/Throat: Oropharynx is clear and moist.  Eyes: Conjunctivae and EOM are normal. Pupils are equal, round, and reactive to light.  Neck: Normal range of motion. Neck supple.  Cardiovascular: Regular rhythm, normal heart sounds and intact distal pulses.        tachycardic  Pulmonary/Chest: Effort normal and breath sounds normal. No respiratory distress. She has no wheezes. She has no rales.  Abdominal: Soft. She  exhibits no distension. There is no tenderness. There is no rebound and no guarding.  Musculoskeletal: Normal range of motion.  Neurological: She is alert and oriented to person, place, and time.  Skin: Skin is warm. No rash noted. There is erythema.     Psychiatric: She has a normal mood and affect.    ED Course  Procedures (including critical care time)  Labs Reviewed  CBC - Abnormal; Notable for the following:    WBC 24.3 (*)    Hemoglobin 15.1 (*)    Platelets 444 (*)    All other components within normal limits  DIFFERENTIAL - Abnormal; Notable for the following:    Neutrophils Relative 92 (*)    Neutro Abs 22.3 (*)    Lymphocytes Relative 4 (*)    All other components within normal limits  BASIC METABOLIC PANEL - Abnormal; Notable for the following:    Potassium 3.1 (*)    Glucose, Bld 129 (*)    All other components within normal limits  CULTURE, BLOOD (ROUTINE X 2)  CULTURE,  BLOOD (ROUTINE X 2)   No results found.   1. Cellulitis of breast     MDM  Right breast cellulitis. IV Vancomycin. Admission for IV abx and needs imaging to r/o underlying abscess.  Discussed admission with [facility] hospitalist Dr. Brien Few. Request add zosyn. Admit to med/surg. Will recheck vitals, pain control, transfer.      Forbes Cellar, MD 10/04/11 1840

## 2011-10-04 NOTE — ED Notes (Signed)
Pt states she is a breast cancer survivor and has had cellulitis of the right breast three times. Fever 102 at home. States breast is red, tender and swollen.

## 2011-10-04 NOTE — Progress Notes (Addendum)
ANTIBIOTIC CONSULT NOTE - INITIAL  Pharmacy Consult for Vancocin/Zosyn Indication: cellulitis  No Known Allergies  Patient Measurements: Height: 5\' 9"  (175.3 cm) Weight: 185 lb (83.915 kg) IBW/kg (Calculated) : 66.2   Vital Signs: Temp: 101.3 F (38.5 C) (04/14 2147) Temp src: Oral (04/14 2147) BP: 114/71 mmHg (04/14 2147) Pulse Rate: 99  (04/14 2147)  Labs:  Basename 10/04/11 1700  WBC 24.3*  HGB 15.1*  PLT 444*  LABCREA --  CREATININE 0.70   Estimated Creatinine Clearance: 88.7 ml/min (by C-G formula based on Cr of 0.7).  Microbiology: No results found for this or any previous visit (from the past 720 hour(s)).  Medical History: Past Medical History  Diagnosis Date  . Anxiety   . Depression   . Hypertension   . Breast cancer 06/02/07    r breast  . S/P breast lumpectomy 07/04/07    R breast  . S/P radiation therapy 2009    Medications:  Prescriptions prior to admission  Medication Sig Dispense Refill  . ALPRAZolam (XANAX) 1 MG tablet TAKE ONE TO TWO TABS EVERY SIX TO EIGHT HOURS PRN ANXIETY  90 tablet  0  . Calcium Carbonate-Vitamin D (CALCIUM + D PO) Take 600 mg by mouth daily.        . hydrochlorothiazide (HYDRODIURIL) 25 MG tablet TAKE 1 TABLET BY MOUTH EVERY DAY  30 tablet  4  . letrozole (FEMARA) 2.5 MG tablet TAKE 1 TABLET BY MOUTH EVERY DAY  30 tablet  11  . oxyCODONE-acetaminophen (PERCOCET) 5-325 MG per tablet Take 1 tablet by mouth as needed.         Scheduled:    . acetaminophen  650 mg Oral Once  . enoxaparin  40 mg Subcutaneous Q24H  . hydrochlorothiazide  25 mg Oral Daily  . letrozole  2.5 mg Oral Daily  .  morphine injection  4 mg Intravenous Once  .  morphine injection  4 mg Intravenous Once  . ondansetron (ZOFRAN) IV  4 mg Intravenous Once  . piperacillin-tazobactam (ZOSYN)  IV  3.375 g Intravenous Once  . sodium chloride  1,000 mL Intravenous Once  . sodium chloride  1,000 mL Intravenous Once  . vancomycin  1,250 mg Intravenous  Once  . vancomycin      . DISCONTD: sodium chloride   Intravenous STAT  . DISCONTD: piperacillin-tazobactam (ZOSYN)  IV  4.5 g Intravenous Once   Assessment: 56yo female s/p R breast lumpectomy/chemo/radiation in 2009 has had recurrent breast cellulitis since procedures, now c/o fever, chills, shivering, found with extensive cellulitis of right breast, to begin IV ABX.  Goal of Therapy:  Vancomycin trough level 10-15 mcg/ml  Plan:  Rec'd vanc 1250mg  IV and Zosyn 3.375g IV at [facility]; will continue with vancomycin 1000mg  IV Q12H and Zosyn 3.375g IV Q8H and monitor CBC, Cx, levels prn.  Colleen Can PharmD BCPS 10/04/2011,11:35 PM

## 2011-10-04 NOTE — H&P (Signed)
PCP:   Cancer Center: Dr. Donnie Coffin   Chief Complaint:  Right breast cellulitis  HPI: This is a 56 year old female with history of right breast cancer status post lumpectomy, chemotherapy and radiation completed in 2009. She states since then she's had 4 separate episodes of cellulitis of the right breast. This morning she woke up at 10 AM freezing. Her daughter took her temperature and she was a little bit over 100. She then noted that her right breast felt tender. She continued to have shivering and chills all day. They examined her right breast and she had extensive cellulitis. [place] went [place], she was examined and sent to [facility]. At [facility] she received Zosyn and vancomycin, blood cultures were also done. Her temperature is gone up as high as 102.9. History provided by the patient was alert and oriented but quite anxious.  Review of Systems: Positives bolded  The patient denies anorexia, fever, weight loss,, vision loss, decreased hearing, hoarseness, chest pain, syncope, dyspnea on exertion, peripheral edema, balance deficits, hemoptysis, abdominal pain, melena, hematochezia, severe indigestion/heartburn, hematuria, incontinence, genital sores, muscle weakness, suspicious skin lesions, transient blindness, difficulty walking, depression, unusual weight change, abnormal bleeding, enlarged lymph nodes, angioedema, and breast masses.  Past Medical History: Past Medical History  Diagnosis Date  . Anxiety   . Depression   . Hypertension   . Breast cancer 06/02/07    r breast  . S/P breast lumpectomy 07/04/07    R breast  . S/P radiation therapy 2009   Past Surgical History  Procedure Date  . Mastectomy partial / lumpectomy w/ axillary lymphadenectomy     Medications: Prior to Admission medications   Medication Sig Start Date End Date Taking? Authorizing Provider  ALPRAZolam Prudy Feeler) 1 MG tablet TAKE ONE TO TWO TABS EVERY SIX TO EIGHT HOURS PRN ANXIETY 07/06/11   Yes Amada Kingfisher, PA  Calcium Carbonate-Vitamin D (CALCIUM + D PO) Take 600 mg by mouth daily.     Yes Historical Provider, MD  hydrochlorothiazide (HYDRODIURIL) 25 MG tablet TAKE 1 TABLET BY MOUTH EVERY DAY 08/02/11  Yes Amada Kingfisher, PA  letrozole [facility]) 2.5 MG tablet TAKE 1 TABLET BY MOUTH EVERY DAY 08/02/11  Yes Amada Kingfisher, PA  oxyCODONE-acetaminophen (PERCOCET) 5-325 MG per tablet Take 1 tablet by mouth as needed.     Yes Historical Provider, MD    Allergies:  No Known Allergies  Social History:  reports that she quit smoking about 14 years ago. She has never used smokeless tobacco. She reports that she drinks alcohol. She reports that she does not use illicit drugs.  Family History: Family History  Problem Relation Age of Onset  . Cancer Mother     colon  . Heart disease Father     heart attack  . Cancer Brother     colon, 2 brothers    Physical Exam: Filed Vitals:   10/04/11 1846 10/04/11 1926 10/04/11 2023 10/04/11 2147  BP: 135/87 142/91 132/84 114/71  Pulse: 120 118 108 99  Temp: 102.9 F (39.4 C)   101.3 F (38.5 C)  TempSrc: Oral   Oral  Resp: 16 20  20   Height:      Weight:      SpO2: 97% 97% 94% 95%    General:  Alert and oriented times three, well developed and nourished, no acute distress Eyes: PERRLA, pink conjunctiva, no scleral icterus ENT: Moist oral mucosa, neck supple, no thyromegaly Breasts: Right breast  with extensive cellulitis and erythema encompassing the entire breasts. No drainage  Lungs: clear to ascultation, no wheeze, no crackles, no use of accessory muscles Cardiovascular: regular rate and rhythm, no regurgitation, no gallops, no murmurs. No carotid bruits, no JVD Abdomen: soft, positive BS, non-tender, non-distended, no organomegaly, not an acute abdomen GU: not examined Neuro: CN II - XII grossly intact, sensation intact Musculoskeletal: strength 5/5 all extremities, no clubbing, cyanosis or edema Skin: no  rash, no subcutaneous crepitation, no decubitus Psych: appropriate patient   Labs on Admission:   Basename 10/04/11 1700  NA 136  K 3.1*  CL 96  CO2 25  GLUCOSE 129*  BUN 8  CREATININE 0.70  CALCIUM 9.4  MG --  PHOS --   No results found for this basename: AST:2,ALT:2,ALKPHOS:2,BILITOT:2,PROT:2,ALBUMIN:2 in the last 72 hours No results found for this basename: LIPASE:2,AMYLASE:2 in the last 72 hours  Basename 10/04/11 1700  WBC 24.3*  NEUTROABS 22.3*  HGB 15.1*  HCT 42.5  MCV 89.5  PLT 444*   No results found for this basename: CKTOTAL:3,CKMB:3,CKMBINDEX:3,TROPONINI:3 in the last 72 hours No components found with this basename: POCBNP:3 No results found for this basename: DDIMER:2 in the last 72 hours No results found for this basename: HGBA1C:2 in the last 72 hours No results found for this basename: CHOL:2,HDL:2,LDLCALC:2,TRIG:2,CHOLHDL:2,LDLDIRECT:2 in the last 72 hours No results found for this basename: TSH,T4TOTAL,FREET3,T3FREE,THYROIDAB in the last 72 hours No results found for this basename: VITAMINB12:2,FOLATE:2,FERRITIN:2,TIBC:2,IRON:2,RETICCTPCT:2 in the last 72 hours  Micro Results: No results found for this or any previous visit (from the past 240 hour(s)).   Radiological Exams on Admission: No results found.  Assessment/Plan Present on Admission:   right breast cellulitis-recurrent Admit to MedSurg Continue antibiotics Zosyn and vancomycin pharmacy to dose Blood cultures and urine cultures done at [facility] Pain medication as needed Hypokalemia Replete IV fluids  Hypertension Anxiety Depression Stable resume home medications  Full code DVT prophylaxis Team 6/Dr. Derry Lory, Yuriana Gaal 10/04/2011, 11:06 PM

## 2011-10-04 NOTE — ED Notes (Signed)
Pt transported with indwelling IV.  Infusing without difficulty. 

## 2011-10-05 DIAGNOSIS — Z86718 Personal history of other venous thrombosis and embolism: Secondary | ICD-10-CM | POA: Insufficient documentation

## 2011-10-05 DIAGNOSIS — N61 Mastitis without abscess: Secondary | ICD-10-CM

## 2011-10-05 LAB — CBC
HCT: 37.7 % (ref 36.0–46.0)
HCT: 36 % (ref 36.0–46.0)
Hemoglobin: 13 g/dL (ref 12.0–15.0)
Hemoglobin: 12.4 g/dL (ref 12.0–15.0)
MCV: 90.2 fL (ref 78.0–100.0)
MCV: 91.4 fL (ref 78.0–100.0)
RBC: 3.94 MIL/uL (ref 3.87–5.11)
RDW: 13 % (ref 11.5–15.5)
WBC: 16.8 10*3/uL — ABNORMAL HIGH (ref 4.0–10.5)
WBC: 12 10*3/uL — ABNORMAL HIGH (ref 4.0–10.5)

## 2011-10-05 LAB — CREATININE, SERUM
GFR calc Af Amer: >90 mL/min (ref >90)
GFR calc non Af Amer: >90 mL/min (ref >90)

## 2011-10-05 LAB — BASIC METABOLIC PANEL
CO2: 28 mEq/L (ref 19–32)
Chloride: 97 mEq/L (ref 96–112)
Creatinine, Ser: 0.75 mg/dL (ref 0.50–1.10)
GFR calc Af Amer: >90 mL/min (ref >90)
Potassium: 2.8 mEq/L — ABNORMAL LOW (ref 3.5–5.1)
Sodium: 135 mEq/L (ref 135–145)

## 2011-10-05 MED ORDER — IBUPROFEN 600 MG PO TABS
600.0000 mg | ORAL_TABLET | Freq: Three times a day (TID) | ORAL | Status: DC
  Administered 2011-10-05 – 2011-10-11 (×18): 600 mg via ORAL
  Filled 2011-10-05 (×21): qty 1

## 2011-10-05 MED ORDER — POTASSIUM CHLORIDE CRYS ER 20 MEQ PO TBCR
40.0000 meq | EXTENDED_RELEASE_TABLET | Freq: Two times a day (BID) | ORAL | Status: AC
  Administered 2011-10-05 – 2011-10-06 (×4): 40 meq via ORAL
  Filled 2011-10-05: qty 2
  Filled 2011-10-05: qty 1
  Filled 2011-10-05 (×2): qty 2
  Filled 2011-10-05: qty 1
  Filled 2011-10-05: qty 2

## 2011-10-05 MED ORDER — OXYCODONE-ACETAMINOPHEN 5-325 MG PO TABS: 1.0000 | ORAL_TABLET | Freq: Four times a day (QID) | ORAL | Status: DC

## 2011-10-05 MED ORDER — HYDROMORPHONE HCL PF 1 MG/ML IJ SOLN
0.5000 mg | INTRAMUSCULAR | Status: DC | PRN
  Administered 2011-10-05: 0.5 mg via INTRAVENOUS
  Filled 2011-10-05: qty 1

## 2011-10-05 MED ORDER — MORPHINE SULFATE 4 MG/ML IJ SOLN
4.0000 mg | INTRAMUSCULAR | Status: DC | PRN
  Administered 2011-10-05 – 2011-10-09 (×17): 4 mg via INTRAVENOUS
  Filled 2011-10-05 (×18): qty 1

## 2011-10-05 MED ORDER — OXYCODONE-ACETAMINOPHEN 5-325 MG PO TABS
1.0000 | ORAL_TABLET | ORAL | Status: DC | PRN
  Administered 2011-10-05 – 2011-10-09 (×15): 1 via ORAL
  Filled 2011-10-05 (×17): qty 1

## 2011-10-05 MED ORDER — CEFAZOLIN SODIUM-DEXTROSE 2-3 GM-% IV SOLR
2.0000 g | Freq: Three times a day (TID) | INTRAVENOUS | Status: DC
  Administered 2011-10-05 – 2011-10-09 (×12): 2 g via INTRAVENOUS
  Filled 2011-10-05 (×14): qty 50

## 2011-10-05 NOTE — Progress Notes (Signed)
Madison West is an 56 y.o. female.    Patient originally admitted to [facility] under Dr. Brien Few, transferred to our service this am because patient is a [facility] patient.   Chief Complaint: Right Breast cellulitis, mastitis, Sepsis HPI:  56 y/o c/f presenting with right mastitis and Sepsis now resolved sepsis with IV Vancomycin and Zosyn. WBC 24 on admission with fever 102, now afebrile and wbc 12. Patient had rigors and chills, now comfortable. No evidence of abscess on right breast per exam, no imaging done. Patient had two previous episodes of right breast cellulitis in 11/26/2010, last one one year ago approx.  She has a history of right lumpectomy and lymphadenectomy s/p chemo/radiation In 11/26/2007 now in remission with 6 month surveillance by Dr. Bosie Clos at [facility].  Pt. Denies SOB, CP. Has a hx of DVT in right leg associated with immobility from broken foot and malignancy hypercoagulable state. No other DVT since and no anticoagulation at home.    Past Medical History  Diagnosis Date  . Anxiety   . Depression   . Hypertension   . Breast cancer 06/02/07    r breast  . S/P breast lumpectomy 07/04/07    R breast  . S/P radiation therapy 26-Nov-2007  DVT right leg.   Past Surgical History  Procedure Date  . Mastectomy partial / lumpectomy w/ axillary lymphadenectomy   Foot surgery for broken bone.   Family History  Problem Relation Age of Onset  . Cancer Mother     colon  . Heart disease Father     heart attack  . Cancer Brother     colon, 2 brothers   Social History:  reports that she quit smoking about 14 years ago. She has never used smokeless tobacco. She reports that she drinks alcohol. She reports that she does not use illicit drugs. She is not sexually active since husband died in November 26, 2006.   Allergies: NKDA, does not want ativan (family member had bad reaction)  Medications: See Order mgmt.  Results for orders placed during the hospital encounter  of 10/04/11 (from the past 48 hour(s))  CBC     Status: Abnormal   Collection Time   10/04/11  5:00 PM      Component Value Range Comment   WBC 24.3 (*) 4.0 - 10.5 (K/uL)    RBC 4.75  3.87 - 5.11 (MIL/uL)    Hemoglobin 15.1 (*) 12.0 - 15.0 (g/dL)    HCT 16.1  09.6 - 04.5 (%)    MCV 89.5  78.0 - 100.0 (fL)    MCH 31.8  26.0 - 34.0 (pg)    MCHC 35.5  30.0 - 36.0 (g/dL)    RDW 40.9  81.1 - 91.4 (%)    Platelets 444 (*) 150 - 400 (K/uL)   DIFFERENTIAL     Status: Abnormal   Collection Time   10/04/11  5:00 PM      Component Value Range Comment   Neutrophils Relative 92 (*) 43 - 77 (%)    Neutro Abs 22.3 (*) 1.7 - 7.7 (K/uL)    Lymphocytes Relative 4 (*) 12 - 46 (%)    Lymphs Abs 1.1  0.7 - 4.0 (K/uL)    Monocytes Relative 4  3 - 12 (%)    Monocytes Absolute 0.9  0.1 - 1.0 (K/uL)    Eosinophils Relative 0  0 - 5 (%)    Eosinophils Absolute 0.0  0.0 - 0.7 (K/uL)  Basophils Relative 0  0 - 1 (%)    Basophils Absolute 0.0  0.0 - 0.1 (K/uL)   BASIC METABOLIC PANEL     Status: Abnormal   Collection Time   10/04/11  5:00 PM      Component Value Range Comment   Sodium 136  135 - 145 (mEq/L)    Potassium 3.1 (*) 3.5 - 5.1 (mEq/L)    Chloride 96  96 - 112 (mEq/L)    CO2 25  19 - 32 (mEq/L)    Glucose, Bld 129 (*) 70 - 99 (mg/dL)    BUN 8  6 - 23 (mg/dL)    Creatinine, Ser 5.78  0.50 - 1.10 (mg/dL)    Calcium 9.4  8.4 - 10.5 (mg/dL)    GFR calc non Af Amer >90  >90 (mL/min)    GFR calc Af Amer >90  >90 (mL/min)   CULTURE, BLOOD (ROUTINE X 2)     Status: Normal (Preliminary result)   Collection Time   10/04/11  5:00 PM      Component Value Range Comment   Specimen Description BLOOD LEFT ARM      Special Requests BOTTLES DRAWN AEROBIC AND ANAEROBIC 5CC EACH      Culture  Setup Time [id number]      Culture        Value:        BLOOD CULTURE RECEIVED NO GROWTH TO DATE CULTURE WILL BE HELD FOR 5 DAYS BEFORE ISSUING A FINAL NEGATIVE REPORT   Report Status PENDING     CULTURE, BLOOD  (ROUTINE X 2)     Status: Normal (Preliminary result)   Collection Time   10/04/11  5:00 PM      Component Value Range Comment   Specimen Description BLOOD LEFT FOREARM      Special Requests        Value: BOTTLES DRAWN AEROBIC AND ANAEROBIC 3CC BLUE, 5CC RED   Culture  Setup Time [id number]      Culture        Value:        BLOOD CULTURE RECEIVED NO GROWTH TO DATE CULTURE WILL BE HELD FOR 5 DAYS BEFORE ISSUING A FINAL NEGATIVE REPORT   Report Status PENDING     CBC     Status: Abnormal   Collection Time   10/05/11  1:32 AM      Component Value Range Comment   WBC 16.8 (*) 4.0 - 10.5 (K/uL)    RBC 4.18  3.87 - 5.11 (MIL/uL)    Hemoglobin 13.0  12.0 - 15.0 (g/dL)    HCT 64.4  03.4 - 74.2 (%)    MCV 90.2  78.0 - 100.0 (fL)    MCH 31.1  26.0 - 34.0 (pg)    MCHC 34.5  30.0 - 36.0 (g/dL)    RDW 59.5  63.8 - 75.6 (%)    Platelets 341  150 - 400 (K/uL)   CREATININE, SERUM     Status: Normal   Collection Time   10/05/11  1:32 AM      Component Value Range Comment   Creatinine, Ser 0.79  0.50 - 1.10 (mg/dL)    GFR calc non Af Amer >90  >90 (mL/min)    GFR calc Af Amer >90  >90 (mL/min)   BASIC METABOLIC PANEL     Status: Abnormal   Collection Time   10/05/11  6:35 AM      Component Value Range Comment   Sodium 135  135 - 145 (mEq/L)    Potassium 2.8 (*) 3.5 - 5.1 (mEq/L)    Chloride 97  96 - 112 (mEq/L)    CO2 28  19 - 32 (mEq/L)    Glucose, Bld 125 (*) 70 - 99 (mg/dL)    BUN 9  6 - 23 (mg/dL)    Creatinine, Ser 2.95  0.50 - 1.10 (mg/dL)    Calcium 8.4  8.4 - 10.5 (mg/dL)    GFR calc non Af Amer >90  >90 (mL/min)    GFR calc Af Amer >90  >90 (mL/min)   CBC     Status: Abnormal   Collection Time   10/05/11  6:35 AM      Component Value Range Comment   WBC 12.0 (*) 4.0 - 10.5 (K/uL)    RBC 3.94  3.87 - 5.11 (MIL/uL)    Hemoglobin 12.4  12.0 - 15.0 (g/dL)    HCT 62.1  30.8 - 65.7 (%)    MCV 91.4  78.0 - 100.0 (fL)    MCH 31.5  26.0 - 34.0 (pg)    MCHC 34.4  30.0 - 36.0 (g/dL)      RDW 84.6  96.2 - 15.5 (%)    Platelets 318  150 - 400 (K/uL)    ROS Pertinent items are noted in HPI.  Blood pressure 131/83, pulse 91, temperature 100.2 F (37.9 C), temperature source Oral, resp. rate 18, height 5\' 9"  (1.753 m), weight 185 lb (83.915 kg), SpO2 95.00%. Physical Exam  General: pleasant, C/F, aox3, communicates normally. In no distress. Lungs:  Normal respiratory effort, chest expands symmetrically. Lungs are clear to auscultation, no crackles or wheezes. Heart - Regular rate and rhythm.  No murmurs, gallops or rubs.    Skin:  Intact without suspicious lesions or rashes Extremities:   Non-tender, No cyanosis, edema, or deformity noted. Neurologic exam : Cn 2-7 intact, no focal deficits. Mouth - no lesions, mucous membranes are moist, no decaying teeth  Neck:  No deformities, thyromegaly, masses, or tenderness noted.   Supple with full range of motion without pain.  Right breast: diffuse cellulitis without induration or fluctuation. No nipple drainage. No peau de orange or dimpling. No right arm lymphedema. Tender to palpation. Marked off with marking pen.  Assessment/Plan 56 y/o c/f with right breast cellulitis and resolving sepsis  1. Right breast cellulitis - responding to Vanc and Zosyn - follow ID consult per [facility] - Percocet and morphine for pain - area marked off with marking pen - no evidence of abscess on exam - if no improvement would obtain ultrasound - likely occurred from seeded bacteria because patient has distorted architecture s/p lymphadenectomy/radiation  2. Sepsis Fever 102 and WBC 24 with Source - now resolved with antibiotics. - follow fever trend, WBC now 12 - DC'd IV fluids, patient tolerating PO, pressure normal  3. Breast Cancer in remission Will arrange follow up at discharge.  4. HTN Continue home HCTZ 25 mg po QD BP Readings from Last 3 Encounters:  10/05/11 131/83  07/06/11 135/84   5. Anxiety Continue home  xanax  6. Hypokalemia Kdur 40 meq BID for 4 doses Received 20 meq in 75 cc/hour bag of fluid thus far  7. FENGI: regular diet, up ad lib, patient may shower, dc bed alarm, Lovenox 40 mg subq q 24 hours.  8. Dispo: Pending clinical improvement of right breast and afebrile for more than 24 hours.  Edd Arbour MD 10/05/2011, 10:23 AM

## 2011-10-05 NOTE — Progress Notes (Signed)
Subjective: Right breast is tender, but no longer has chills. Has a mild headache.  Objective: Vital signs in last 24 hours: Temp:  [99.1 F (37.3 C)-102.9 F (39.4 C)] 100.2 F (37.9 C) (04/15 0551) Pulse Rate:  [91-120] 91  (04/15 0551) Resp:  [16-20] 18  (04/15 0551) BP: (114-165)/(71-103) 131/83 mmHg (04/15 0551) SpO2:  [94 %-97 %] 95 % (04/15 0551) Weight:  [83.915 kg (185 lb)] 83.915 kg (185 lb) (04/14 1606) Weight change:  Last BM Date: 10/04/11  Intake/Output from previous day: 04/14 0701 - 04/15 0700 In: 426.3 [I.V.:126.3; IV Piggyback:300] Out: -      Physical Exam: General: Comfortable, alert, garrulous,, fully oriented, not short of breath at rest.  HEENT:  No clinical pallor, no jaundice, no conjunctival injection or discharge. Hydration status appears fair. NECK:  Supple, JVP not seen, no carotid bruits, no palpable lymphadenopathy, no palpable goiter. CHEST:  Clinically clear to auscultation, no wheezes, no crackles. BREAST:  Patient has large pendulous breasts. There is  rather dramatic erythema of right breast, affecting all of the undersurface, and at least 2/3 of the upper surface. There is no palpable axillary lymphadenopathy, and no circumscribed induration, fluctuance or lumps. The left breast is unremarkable. HEART:  Sounds 1 and 2 heard, normal, regular, no murmurs. ABDOMEN:  Full, soft, no scars, non-tender, no palpable organomegaly, no palpable masses, normal bowel sounds. GENITALIA:  Not examined. LOWER EXTREMITIES:  No pitting edema, palpable peripheral pulses. MUSCULOSKELETAL SYSTEM:  Generalized osteoarthritic changes, otherwise, normal. CENTRAL NERVOUS SYSTEM:  No focal neurologic deficit on gross examination.  Lab Results:  Basename 10/05/11 0635 10/05/11 0132  WBC 12.0* 16.8*  HGB 12.4 13.0  HCT 36.0 37.7  PLT 318 341    Basename 10/05/11 0635 10/05/11 0132 10/04/11 1700  NA 135 -- 136  K 2.8* -- 3.1*  CL 97 -- 96  CO2 28 -- 25    GLUCOSE 125* -- 129*  BUN 9 -- 8  CREATININE 0.75 0.79 --  CALCIUM 8.4 -- 9.4   No results found for this or any previous visit (from the past 240 hour(s)).   Studies/Results: No results found.  Medications: Scheduled Meds:   . acetaminophen  650 mg Oral Once  . enoxaparin  40 mg Subcutaneous Q24H  . hydrochlorothiazide  25 mg Oral Daily  . letrozole  2.5 mg Oral Daily  .  morphine injection  4 mg Intravenous Once  .  morphine injection  4 mg Intravenous Once  . ondansetron (ZOFRAN) IV  4 mg Intravenous Once  . piperacillin-tazobactam (ZOSYN)  IV  3.375 g Intravenous Once  . piperacillin-tazobactam  3.375 g Intravenous Once  . piperacillin-tazobactam (ZOSYN)  IV  3.375 g Intravenous Q8H  . sodium chloride  1,000 mL Intravenous Once  . sodium chloride  1,000 mL Intravenous Once  . vancomycin  1,250 mg Intravenous Once  . vancomycin      . vancomycin  1,000 mg Intravenous Q12H  . DISCONTD: sodium chloride   Intravenous STAT  . DISCONTD: piperacillin-tazobactam (ZOSYN)  IV  4.5 g Intravenous Once   Continuous Infusions:   . 0.9 % NaCl with KCl 20 mEq / L 75 mL/hr at 10/04/11 2344   PRN Meds:.acetaminophen, acetaminophen, ALPRAZolam, alum & mag hydroxide-simeth, morphine, ondansetron (ZOFRAN) IV, ondansetron, oxyCODONE-acetaminophen  Assessment/Plan:  Active Problems:  1. Cellulitis of right breast: This is recurrent. Patient has had three previous episodes, the last time in 08/2011. She now presents with yet another episode, which appears rather  sudden in onset and rapidly progressive. She is currently on empiric Vancomycin/Zosyn, day# 2, but has spiked significant temperature overnight, although she feels subjectively better, and wcc has dropped rather dramatically. Clinically, there does not appear to be a localized abscess on physical examination, but if response is tardy, imaging studies may prove necessary. We shall invite our ID colleagues to weigh in. The most likely  predisposition, is previous lymph node dissection. We shall place on scheduled NSAIDS and analgesics.  2. Hypertension: BP is well controlled at this time.  3. Anxiety/Depression: This appears stable, on pre-admission prn anxiolytics.  4. History of right breast cancer:  Patient has a known history of a multi-node positive, ER/PR positive right breast carcinoma, diagnosed in 2009.Marland Kitchen She is s/p Lumpectomy,  chemotherapy, radiation therapy, and  continues on Femara 2.5 mg by mouth daily. She follows up regularly with her oncologist, Dr Pierce Crane, and has had no evidence of disease recurrence..      LOS: 1 day   Shamila Lerch,CHRISTOPHER 10/05/2011, 8:38 AM

## 2011-10-05 NOTE — Progress Notes (Signed)
Utilization Review Completed.Ming Mcmannis, Veronia T4/15/2013   

## 2011-10-05 NOTE — Progress Notes (Signed)
Family Medicine Teaching Service Attending Note  I discussed patient [place]  with Dr. Rivka Safer and reviewed their note for today.  I agree with their assessment and plan.

## 2011-10-05 NOTE — Consult Note (Signed)
Date of Admission:  10/04/2011  Date of Consult:  10/05/2011  Reason for Consult:recurrent severe breast cellulitis Referring Physician: Dr. Brien Few   HPI: Madison West is an 56 y.o. female with PMHX significant for breast cancer sp lumpectomy, XRT chemotherapy followed at [facility] by Pierce Crane. Since her lumpectomy with axillary LN dissection she has had now 4 episodes of severe cellulitis of this right breast. First episode in February of 2010, , then again January and then March of  2012. On each occasion the onset was sudden and she was treated with agents active vs both MRSA *(vancomycin, doxycycline, clindamycin (not as reliable) with drugs more active vs Streptococcal species and MSSA (ancef, keflex,). It had been proposed previously that she be placed on prophylactic antibiotics but she was opposed to this. Yesterday she had acute onset of rigors, chills, breast pain that worsened simply walking across the room. In the ED she was febrile to 102.9 and with WBC BUT NOT with sepsis. She was started on vancomycin and zosyn and has had improvement in her fever and in erythema of her right breast. She has a dog at home who scratched against her left breast but not the involved breast. She did take bath in her own personal whirlpool but has not frequented public hot tubs or bodies of water such as lakes or oceans recently.   Past Medical History  Diagnosis Date  . Anxiety   . Depression   . Hypertension   . Breast cancer 06/02/07    r breast  . S/P breast lumpectomy 07/04/07    R breast  . S/P radiation therapy 2009    Past Surgical History  Procedure Date  . Mastectomy partial / lumpectomy w/ axillary lymphadenectomy   ergies:   No Known Allergies   Medications: I have reviewed patients current medications as documented in Epic Anti-infectives     Start     Dose/Rate Route Frequency Ordered Stop   10/05/11 1430   ceFAZolin (ANCEF) IVPB 2 g/50 mL premix        2 g 100 mL/hr  over 30 Minutes Intravenous 3 times per day 10/05/11 1419     10/05/11 0600   piperacillin-tazobactam (ZOSYN) IVPB 3.375 g  Status:  Discontinued        3.375 g 12.5 mL/hr over 240 Minutes Intravenous Every 8 hours 10/04/11 2343 10/05/11 1419   10/05/11 0600   vancomycin (VANCOCIN) IVPB 1000 mg/200 mL premix        1,000 mg 200 mL/hr over 60 Minutes Intravenous Every 12 hours 10/04/11 2343     10/04/11 2345   piperacillin-tazobactam (ZOSYN) IVPB 3.375 g  Status:  Discontinued        3.375 g 100 mL/hr over 30 Minutes Intravenous  Once 10/04/11 2343 10/05/11 1419   10/04/11 1900  piperacillin-tazobactam (ZOSYN) IVPB 3.375 g       3.375 g 12.5 mL/hr over 240 Minutes Intravenous  Once 10/04/11 1854 10/04/11 2341   10/04/11 1845   piperacillin-tazobactam (ZOSYN) IVPB 4.5 g  Status:  Discontinued        4.5 g 200 mL/hr over 30 Minutes Intravenous  Once 10/04/11 1838 10/04/11 1854   10/04/11 1735   vancomycin (VANCOCIN) 500 MG powder     Comments: Baughman, Adrienne: cabinet override         10/04/11 1735 10/04/11 1908   10/04/11 1630   vancomycin (VANCOCIN) 1,250 mg in sodium chloride 0.9 % 250 mL IVPB  1,250 mg 166.7 mL/hr over 90 Minutes Intravenous  Once 10/04/11 1624 10/04/11 1926          Social History:  reports that she quit smoking about 14 years ago. She has never used smokeless tobacco. She reports that she drinks alcohol. She reports that she does not use illicit drugs.  Family History  Problem Relation Age of Onset  . Cancer Mother     colon  . Heart disease Father     heart attack  . Cancer Brother     colon, 2 brothers    As in HPI and primary teams notes otherwise 12 point review of systems is negative  Blood pressure 131/83, pulse 91, temperature 100.2 F (37.9 C), temperature source Oral, resp. rate 18, height 5\' 9"  (1.753 m), weight 185 lb (83.915 kg), SpO2 95.00%. General: Alert and awake, oriented x3, not in any acute distress. HEENT: anicteric  sclera, pupils reactive to light and accommodation, EOMI, oropharynx clear and without exudate CVS regular rate, normal r,  no murmur rubs or gallops Chest: clear to auscultation bilaterally, no wheezing, rales or rhonchi Abdomen: soft nontender, nondistended, normal bowel sounds, Extremities: no  clubbing or edema noted bilaterally Skin: right breast is intensely erythematous and tender to palpation. No obvious abscess Neuro: nonfocal, strength and sensation intact   Results for orders placed during the hospital encounter of 10/04/11 (from the past 48 hour(s))  CBC     Status: Abnormal   Collection Time   10/04/11  5:00 PM      Component Value Range Comment   WBC 24.3 (*) 4.0 - 10.5 (K/uL)    RBC 4.75  3.87 - 5.11 (MIL/uL)    Hemoglobin 15.1 (*) 12.0 - 15.0 (g/dL)    HCT 45.4  09.8 - 11.9 (%)    MCV 89.5  78.0 - 100.0 (fL)    MCH 31.8  26.0 - 34.0 (pg)    MCHC 35.5  30.0 - 36.0 (g/dL)    RDW 14.7  82.9 - 56.2 (%)    Platelets 444 (*) 150 - 400 (K/uL)   DIFFERENTIAL     Status: Abnormal   Collection Time   10/04/11  5:00 PM      Component Value Range Comment   Neutrophils Relative 92 (*) 43 - 77 (%)    Neutro Abs 22.3 (*) 1.7 - 7.7 (K/uL)    Lymphocytes Relative 4 (*) 12 - 46 (%)    Lymphs Abs 1.1  0.7 - 4.0 (K/uL)    Monocytes Relative 4  3 - 12 (%)    Monocytes Absolute 0.9  0.1 - 1.0 (K/uL)    Eosinophils Relative 0  0 - 5 (%)    Eosinophils Absolute 0.0  0.0 - 0.7 (K/uL)    Basophils Relative 0  0 - 1 (%)    Basophils Absolute 0.0  0.0 - 0.1 (K/uL)   BASIC METABOLIC PANEL     Status: Abnormal   Collection Time   10/04/11  5:00 PM      Component Value Range Comment   Sodium 136  135 - 145 (mEq/L)    Potassium 3.1 (*) 3.5 - 5.1 (mEq/L)    Chloride 96  96 - 112 (mEq/L)    CO2 25  19 - 32 (mEq/L)    Glucose, Bld 129 (*) 70 - 99 (mg/dL)    BUN 8  6 - 23 (mg/dL)    Creatinine, Ser 1.30  0.50 - 1.10 (mg/dL)    Calcium  9.4  8.4 - 10.5 (mg/dL)    GFR calc non Af Amer >90  >90  (mL/min)    GFR calc Af Amer >90  >90 (mL/min)   CULTURE, BLOOD (ROUTINE X 2)     Status: Normal (Preliminary result)   Collection Time   10/04/11  5:00 PM      Component Value Range Comment   Specimen Description BLOOD LEFT ARM      Special Requests BOTTLES DRAWN AEROBIC AND ANAEROBIC 5CC EACH      Culture  Setup Time [id number]      Culture        Value:        BLOOD CULTURE RECEIVED NO GROWTH TO DATE CULTURE WILL BE HELD FOR 5 DAYS BEFORE ISSUING A FINAL NEGATIVE REPORT   Report Status PENDING     CULTURE, BLOOD (ROUTINE X 2)     Status: Normal (Preliminary result)   Collection Time   10/04/11  5:00 PM      Component Value Range Comment   Specimen Description BLOOD LEFT FOREARM      Special Requests        Value: BOTTLES DRAWN AEROBIC AND ANAEROBIC 3CC BLUE, 5CC RED   Culture  Setup Time [id number]      Culture        Value:        BLOOD CULTURE RECEIVED NO GROWTH TO DATE CULTURE WILL BE HELD FOR 5 DAYS BEFORE ISSUING A FINAL NEGATIVE REPORT   Report Status PENDING     CBC     Status: Abnormal   Collection Time   10/05/11  1:32 AM      Component Value Range Comment   WBC 16.8 (*) 4.0 - 10.5 (K/uL)    RBC 4.18  3.87 - 5.11 (MIL/uL)    Hemoglobin 13.0  12.0 - 15.0 (g/dL)    HCT 21.3  08.6 - 57.8 (%)    MCV 90.2  78.0 - 100.0 (fL)    MCH 31.1  26.0 - 34.0 (pg)    MCHC 34.5  30.0 - 36.0 (g/dL)    RDW 46.9  62.9 - 52.8 (%)    Platelets 341  150 - 400 (K/uL)   CREATININE, SERUM     Status: Normal   Collection Time   10/05/11  1:32 AM      Component Value Range Comment   Creatinine, Ser 0.79  0.50 - 1.10 (mg/dL)    GFR calc non Af Amer >90  >90 (mL/min)    GFR calc Af Amer >90  >90 (mL/min)   BASIC METABOLIC PANEL     Status: Abnormal   Collection Time   10/05/11  6:35 AM      Component Value Range Comment   Sodium 135  135 - 145 (mEq/L)    Potassium 2.8 (*) 3.5 - 5.1 (mEq/L)    Chloride 97  96 - 112 (mEq/L)    CO2 28  19 - 32 (mEq/L)    Glucose, Bld 125 (*) 70 - 99  (mg/dL)    BUN 9  6 - 23 (mg/dL)    Creatinine, Ser 4.13  0.50 - 1.10 (mg/dL)    Calcium 8.4  8.4 - 10.5 (mg/dL)    GFR calc non Af Amer >90  >90 (mL/min)    GFR calc Af Amer >90  >90 (mL/min)   CBC     Status: Abnormal   Collection Time   10/05/11  6:35 AM  Component Value Range Comment   WBC 12.0 (*) 4.0 - 10.5 (K/uL)    RBC 3.94  3.87 - 5.11 (MIL/uL)    Hemoglobin 12.4  12.0 - 15.0 (g/dL)    HCT 16.1  09.6 - 04.5 (%)    MCV 91.4  78.0 - 100.0 (fL)    MCH 31.5  26.0 - 34.0 (pg)    MCHC 34.4  30.0 - 36.0 (g/dL)    RDW 40.9  81.1 - 91.4 (%)    Platelets 318  150 - 400 (K/uL)       Component Value Date/Time   SDES BLOOD LEFT ARM 10/04/2011 1700   SDES BLOOD LEFT FOREARM 10/04/2011 1700   SPECREQUEST BOTTLES DRAWN AEROBIC AND ANAEROBIC 5CC EACH 10/04/2011 1700   SPECREQUEST BOTTLES DRAWN AEROBIC AND ANAEROBIC 3CC BLUE, 5CC RED 10/04/2011 1700   CULT        BLOOD CULTURE RECEIVED NO GROWTH TO DATE CULTURE WILL BE HELD FOR 5 DAYS BEFORE ISSUING A FINAL NEGATIVE REPORT 10/04/2011 1700   CULT        BLOOD CULTURE RECEIVED NO GROWTH TO DATE CULTURE WILL BE HELD FOR 5 DAYS BEFORE ISSUING A FINAL NEGATIVE REPORT 10/04/2011 1700   REPTSTATUS PENDING 10/04/2011 1700   REPTSTATUS PENDING 10/04/2011 1700   No results found.   Recent Results (from the past 720 hour(s))  CULTURE, BLOOD (ROUTINE X 2)     Status: Normal (Preliminary result)   Collection Time   10/04/11  5:00 PM      Component Value Range Status Comment   Specimen Description BLOOD LEFT ARM   Final    Special Requests BOTTLES DRAWN AEROBIC AND ANAEROBIC 5CC EACH   Final    Culture  Setup Time [id number]   Final    Culture     Final    Value:        BLOOD CULTURE RECEIVED NO GROWTH TO DATE CULTURE WILL BE HELD FOR 5 DAYS BEFORE ISSUING A FINAL NEGATIVE REPORT   Report Status PENDING   Incomplete   CULTURE, BLOOD (ROUTINE X 2)     Status: Normal (Preliminary result)   Collection Time   10/04/11  5:00 PM      Component Value  Range Status Comment   Specimen Description BLOOD LEFT FOREARM   Final    Special Requests     Final    Value: BOTTLES DRAWN AEROBIC AND ANAEROBIC 3CC BLUE, 5CC RED   Culture  Setup Time [id number]   Final    Culture     Final    Value:        BLOOD CULTURE RECEIVED NO GROWTH TO DATE CULTURE WILL BE HELD FOR 5 DAYS BEFORE ISSUING A FINAL NEGATIVE REPORT   Report Status PENDING   Incomplete      Impression/Recommendation  56 year old with recurrent right sided breast cellulitis after lumpectomy and axillary LN dissection  1) Recurrent breast cellulitis: Rapidity of onset suggests Streptococcal species such as GAS  Or Group B streptococcal species rather than MSSA or MRSA --I would dc the pip/tazo as no need for pseudomonal or anerobic coverage here --change to ancef 2 g iv  q 8 hours --consider dc vancomycin in the am and observe response to high dose ancef alone, if she responds to this alone then this may help guide transition to oral therapy, and considerations for suppressive prophylactic therapy  2) Screening: check hiv ab   Thank you so much for this interesting  consult,   [name] 10/05/2011, 2:20 PM   [phone] (pager) [phone] (office)

## 2011-10-06 ENCOUNTER — Ambulatory Visit: Payer: No Typology Code available for payment source | Admitting: Psychiatry

## 2011-10-06 LAB — CBC
HCT: 35.3 % — ABNORMAL LOW (ref 36.0–46.0)
Hemoglobin: 12.2 g/dL (ref 12.0–15.0)
MCH: 31.8 pg (ref 26.0–34.0)
MCV: 91.9 fL (ref 78.0–100.0)
RBC: 3.84 MIL/uL — ABNORMAL LOW (ref 3.87–5.11)

## 2011-10-06 LAB — BASIC METABOLIC PANEL
CO2: 27 mEq/L (ref 19–32)
Calcium: 8.6 mg/dL (ref 8.4–10.5)
Creatinine, Ser: 0.62 mg/dL (ref 0.50–1.10)
Glucose, Bld: 140 mg/dL — ABNORMAL HIGH (ref 70–99)

## 2011-10-06 NOTE — Progress Notes (Signed)
PGY-1 Daily Progress Note Family Medicine Teaching Service [place] M. Juandaniel Manfredo, MD Service Pager: [phone]  Subjective: Patient doing much better. Afebrile 24 hours. Pain improved. No acute events overnight.  Objective: Vital signs in last 24 hours: Temp:  [96.9 F (36.1 C)-97.8 F (36.6 C)] 97.7 F (36.5 C) (04/16 0540) Pulse Rate:  [72-73] 73  (04/16 0540) Resp:  [18-20] 18  (04/16 0540) BP: (100-105)/(67-90) 100/67 mmHg (04/16 0540) SpO2:  [95 %-97 %] 95 % (04/16 0540) Weight change:  Last BM Date: 10/04/11  Intake/Output from previous day: 04/15 0701 - 04/16 0700 In: 1900 [P.O.:1900] Out: 1 [Urine:1] Intake/Output this shift:   Physical Exam: General: pleasant, alert, no acute distress. Sitting in bed Lungs: NLungs are clear to auscultation, no crackles or wheezes.  Heart: Regular rate and rhythm. No murmurs, gallops or rubs.  Skin:  Right breast with diffuse erythema and tenderness.No induration or fluctuation noted. No nipple drainage; area of decreased erythema around areola. No peau de orange or dimpling. No right arm lymphedema. Area marked off with marking pen; recessed from that line. Some dependent area of edema under patient's right arm, but no increased tenderness of this area. Extremities: Non-tender, No edema Neurologic exam: Nonfocal   Lab Results:  Basename 10/06/11 0615 10/05/11 0635  WBC 7.4 12.0*  HGB 12.2 12.4  HCT 35.3* 36.0  PLT 291 318   BMET  Basename 10/06/11 0615 10/05/11 0635  NA 134* 135  K 3.5 2.8*  CL 97 97  CO2 27 28  GLUCOSE 140* 125*  BUN 12 9  CREATININE 0.62 0.75  CALCIUM 8.6 8.4    Studies/Results: No results found.  Medications:  I have reviewed the patient's current medications. Scheduled:   .  ceFAZolin (ANCEF) IV  2 g Intravenous Q8H  . enoxaparin  40 mg Subcutaneous Q24H  . ibuprofen  600 mg Oral TID  . letrozole  2.5 mg Oral Daily  . potassium chloride  40 mEq Oral BID  . DISCONTD: hydrochlorothiazide  25 mg  Oral Daily  . DISCONTD: oxyCODONE-acetaminophen  1 tablet Oral Q6H  . DISCONTD: piperacillin-tazobactam  3.375 g Intravenous Once  . DISCONTD: piperacillin-tazobactam (ZOSYN)  IV  3.375 g Intravenous Q8H  . DISCONTD: vancomycin  1,000 mg Intravenous Q12H   Continuous:   . DISCONTD: 0.9 % NaCl with KCl 20 mEq / L 75 mL/hr at 10/04/11 2344   LKG:[id number], acetaminophen, ALPRAZolam, alum & mag hydroxide-simeth, morphine injection, ondansetron (ZOFRAN) IV, ondansetron, oxyCODONE-acetaminophen, DISCONTD:  HYDROmorphone (DILAUDID) injection, DISCONTD: morphine, DISCONTD: oxyCODONE-acetaminophen  Assessment/Plan: 56 y/o c/f with right breast cellulitis and resolving sepsis   1. Right breast cellulitis- Improving. This is a recurrent problem for patient and  likely occurred from seeded bacteria because patient has distorted architecture s/p lymphadenectomy/radiation - Responded to Vanc and Zosyn. ID consulted yesterday. D/C Zosyn and switch to Ancef. Patient continues to improve. Will d/c Vanc this morning as recommended by ID. Continue to monitor patient on Ancef only. - We appreciate ID's consult and care of this patient. - Percocet for pain  - No evidence of abscess on exam. Given overall improvement, no need for ultrasound at this time - Will plan to transition to orals based on how patient responds to Ancef only.  2. Sepsis- Meeting criteria at admission with fever of 102 and WBC 24 with source. Now resolved with antibiotics. Afebrile, WBC 7.4, patient has overall improved. - Continue antibiotics as mentioned above - DC'd IV fluids, patient tolerating PO, pressure normal  3. Breast Cancer in remission- Followed as an outpatient. Will arrange follow up at discharge.   4. HTN- BP stable to low.   - Will hold HCTZ for now. Will restart as needed.  5. Anxiety  - Continue home Xanax   6. Hypokalemia- Noted on admission. Received Kdur 40 meq BID for 4 doses and received 20 meq in 75  cc/hour.  - Repleted to 3.5. - Will not continue Kdur. Repeat Bmet in the AM  7. FENGI: Regular diet, up ad lib, Lovenox 40 mg subq q 24 hours.   8. Dispo: Pending clinical improvement of right breast after narrowing of antibiotics. Continue to monitor.   LOS: 2 days   Shiasia Porro 10/06/2011, 7:50 AM

## 2011-10-06 NOTE — Progress Notes (Signed)
Subjective: Had some tenderness lateral side of breast with minimal erythema there overal feels improved   Antibiotics:  Anti-infectives     Start     Dose/Rate Route Frequency Ordered Stop   10/05/11 1500   ceFAZolin (ANCEF) IVPB 2 g/50 mL premix        2 g 100 mL/hr over 30 Minutes Intravenous Every 8 hours 10/05/11 1419     10/05/11 0600   piperacillin-tazobactam (ZOSYN) IVPB 3.375 g  Status:  Discontinued        3.375 g 12.5 mL/hr over 240 Minutes Intravenous Every 8 hours 10/04/11 2343 10/05/11 1419   10/05/11 0600   vancomycin (VANCOCIN) IVPB 1000 mg/200 mL premix  Status:  Discontinued        1,000 mg 200 mL/hr over 60 Minutes Intravenous Every 12 hours 10/04/11 2343 10/06/11 0748   10/04/11 2345   piperacillin-tazobactam (ZOSYN) IVPB 3.375 g  Status:  Discontinued        3.375 g 100 mL/hr over 30 Minutes Intravenous  Once 10/04/11 2343 10/05/11 1419   10/04/11 1900  piperacillin-tazobactam (ZOSYN) IVPB 3.375 g       3.375 g 12.5 mL/hr over 240 Minutes Intravenous  Once 10/04/11 1854 10/04/11 2341   10/04/11 1845   piperacillin-tazobactam (ZOSYN) IVPB 4.5 g  Status:  Discontinued        4.5 g 200 mL/hr over 30 Minutes Intravenous  Once 10/04/11 1838 10/04/11 1854   10/04/11 1735   vancomycin (VANCOCIN) 500 MG powder     Comments: [place]: cabinet override         10/04/11 1735 10/04/11 1908   10/04/11 1630   vancomycin (VANCOCIN) 1,250 mg in sodium chloride 0.9 % 250 mL IVPB        1,250 mg 166.7 mL/hr over 90 Minutes Intravenous  Once 10/04/11 1624 10/04/11 1926          Medications: Scheduled Meds:   .  ceFAZolin (ANCEF) IV  2 g Intravenous Q8H  . enoxaparin  40 mg Subcutaneous Q24H  . ibuprofen  600 mg Oral TID  . letrozole  2.5 mg Oral Daily  . potassium chloride  40 mEq Oral BID  . DISCONTD: hydrochlorothiazide  25 mg Oral Daily  . DISCONTD: piperacillin-tazobactam  3.375 g Intravenous Once  . DISCONTD: piperacillin-tazobactam (ZOSYN)  IV   3.375 g Intravenous Q8H  . DISCONTD: vancomycin  1,000 mg Intravenous Q12H   Continuous Infusions:  PRN Meds:.acetaminophen, acetaminophen, ALPRAZolam, alum & mag hydroxide-simeth, morphine injection, ondansetron (ZOFRAN) IV, ondansetron, oxyCODONE-acetaminophen   Objective: Weight change:   Intake/Output Summary (Last 24 hours) at 10/06/11 1138 Last data filed at 10/06/11 0605  Gross per 24 hour  Intake   1660 ml  Output      1 ml  Net   1659 ml   Blood pressure 100/67, pulse 73, temperature 97.7 F (36.5 C), temperature source Oral, resp. rate 18, height 5\' 9"  (1.753 m), weight 185 lb (83.915 kg), SpO2 95.00%. Temp:  [96.9 F (36.1 C)-97.8 F (36.6 C)] 97.7 F (36.5 C) (04/16 0540) Pulse Rate:  [72-73] 73  (04/16 0540) Resp:  [18-20] 18  (04/16 0540) BP: (100-105)/(67-90) 100/67 mmHg (04/16 0540) SpO2:  [95 %-97 %] 95 % (04/16 0540)  Physical Exam: General: Alert and awake, oriented x3, not in any acute distress.  HEENT: anicteric sclera, pupils reactive to light and accommodation, EOMI, oropharynx clear and without exudate  CVS regular rate, normal r, no murmur rubs or gallops  Chest:  clear to auscultation bilaterally, no wheezing, rales or rhonchi  Abdomen: soft nontender, nondistended, normal bowel sounds,  Extremities: no clubbing or edema noted bilaterally  Skin: right breast is intensely erythematous and tender to palpation. There is subtle erythema laterally that is ? new No obvious abscess  Neuro: nonfocal, strength and sensation intact   Lab Results:  Basename 10/06/11 0615 10/05/11 0635  WBC 7.4 12.0*  HGB 12.2 12.4  HCT 35.3* 36.0  PLT 291 318    BMET  Basename 10/06/11 0615 10/05/11 0635  NA 134* 135  K 3.5 2.8*  CL 97 97  CO2 27 28  GLUCOSE 140* 125*  BUN 12 9  CREATININE 0.62 0.75  CALCIUM 8.6 8.4    Micro Results: Recent Results (from the past 240 hour(s))  CULTURE, BLOOD (ROUTINE X 2)     Status: Normal (Preliminary result)    Collection Time   10/04/11  5:00 PM      Component Value Range Status Comment   Specimen Description BLOOD LEFT ARM   Final    Special Requests BOTTLES DRAWN AEROBIC AND ANAEROBIC 5CC EACH   Final    Culture  Setup Time [id number]   Final    Culture     Final    Value:        BLOOD CULTURE RECEIVED NO GROWTH TO DATE CULTURE WILL BE HELD FOR 5 DAYS BEFORE ISSUING A FINAL NEGATIVE REPORT   Report Status PENDING   Incomplete   CULTURE, BLOOD (ROUTINE X 2)     Status: Normal (Preliminary result)   Collection Time   10/04/11  5:00 PM      Component Value Range Status Comment   Specimen Description BLOOD LEFT FOREARM   Final    Special Requests     Final    Value: BOTTLES DRAWN AEROBIC AND ANAEROBIC 3CC BLUE, 5CC RED   Culture  Setup Time [id number]   Final    Culture     Final    Value:        BLOOD CULTURE RECEIVED NO GROWTH TO DATE CULTURE WILL BE HELD FOR 5 DAYS BEFORE ISSUING A FINAL NEGATIVE REPORT   Report Status PENDING   Incomplete     Studies/Results: No results found.    Assessment/Plan: [place] West is a 56 y.o. female   with recurrent right sided breast cellulitis after lumpectomy and axillary LN dissection  1) Recurrent breast cellulitis: Rapidity of onset suggests Streptococcal species such as GAS Or Group B streptococcal species rather than MSSA or MRSA. ZOsyn dc'd, ancef started, vanco stopped this am --continue ancef alone and if responds  this may help guide transition to oral therapy, and considerations for suppressive vs reactive therapy  2) Screening: check hiv ab    LOS: 2 days   Madison West 10/06/2011, 11:38 AM

## 2011-10-06 NOTE — Progress Notes (Signed)
Family Medicine Teaching Service Attending Note  I interviewed and examined patient Madison West and reviewed their tests and x-rays.  I discussed with Dr. Mikel Cella and reviewed their note for today.  I agree with their assessment and plan.     Additionally  Improved Breast still red and tender no fluctuance Appreciate ID's note and recommendations Narrow antibiotics and follow

## 2011-10-06 NOTE — Progress Notes (Signed)
Pt placed on contact isolation per ID policy and procedure [place]

## 2011-10-07 LAB — CBC
HCT: 35.8 % — ABNORMAL LOW (ref 36.0–46.0)
Hemoglobin: 12 g/dL (ref 12.0–15.0)
MCH: 31.1 pg (ref 26.0–34.0)
MCHC: 33.5 g/dL (ref 30.0–36.0)

## 2011-10-07 LAB — BASIC METABOLIC PANEL
BUN: 12 mg/dL (ref 6–23)
CO2: 29 mEq/L (ref 19–32)
Calcium: 9 mg/dL (ref 8.4–10.5)
GFR calc non Af Amer: >90 mL/min (ref >90)
Glucose, Bld: 105 mg/dL — ABNORMAL HIGH (ref 70–99)
Potassium: 4.5 mEq/L (ref 3.5–5.1)

## 2011-10-07 MED ORDER — SENNOSIDES-DOCUSATE SODIUM 8.6-50 MG PO TABS
2.0000 | ORAL_TABLET | Freq: Once | ORAL | Status: AC
  Administered 2011-10-07: 2 via ORAL
  Filled 2011-10-07: qty 2

## 2011-10-07 MED ORDER — VANCOMYCIN HCL IN DEXTROSE 1-5 GM/200ML-% IV SOLN
1000.0000 mg | Freq: Two times a day (BID) | INTRAVENOUS | Status: DC
  Administered 2011-10-07 – 2011-10-08 (×4): 1000 mg via INTRAVENOUS
  Filled 2011-10-07 (×5): qty 200

## 2011-10-07 NOTE — Progress Notes (Addendum)
Family Medicine Teaching Service Attending Note  I discussed patient Madison West  with Dr. Mikel Cella and reviewed their note for today.  I agree with their assessment and plan.    Unsure if her increased pain and possible erythema represents Ancef failure since she was likely still covered with vanc. Maybe due to increased activity  Await ID recommendations

## 2011-10-07 NOTE — Progress Notes (Signed)
ANTIBIOTIC CONSULT NOTE - INITIAL  Pharmacy Consult for vancomycin Indication: Right breast cellulitis  No Known Allergies  Patient Measurements: Height: 5\' 9"  (175.3 cm) Weight: 185 lb (83.915 kg) IBW/kg (Calculated) : 66.2    Vital Signs: Temp: 98 F (36.7 C) (04/17 0542) BP: 107/72 mmHg (04/17 0542) Pulse Rate: 77  (04/17 0542) Intake/Output from previous day: 04/16 0701 - 04/17 0700 In: 3360 [P.O.:3360] Out: 2 [Urine:2] Intake/Output from this shift:    Labs:  Basename 10/07/11 0530 10/06/11 0615 10/05/11 0635  WBC 6.5 7.4 12.0*  HGB 12.0 12.2 12.4  PLT 329 291 318  LABCREA -- -- --  CREATININE 0.64 0.62 0.75   Estimated Creatinine Clearance: 88.7 ml/min (by C-G formula based on Cr of 0.64).  Microbiology: Recent Results (from the past 720 hour(s))  CULTURE, BLOOD (ROUTINE X 2)     Status: Normal (Preliminary result)   Collection Time   10/04/11  5:00 PM      Component Value Range Status Comment   Specimen Description BLOOD LEFT ARM   Final    Special Requests BOTTLES DRAWN AEROBIC AND ANAEROBIC 5CC EACH   Final    Culture  Setup Time [id number]   Final    Culture     Final    Value:        BLOOD CULTURE RECEIVED NO GROWTH TO DATE CULTURE WILL BE HELD FOR 5 DAYS BEFORE ISSUING A FINAL NEGATIVE REPORT   Report Status PENDING   Incomplete   CULTURE, BLOOD (ROUTINE X 2)     Status: Normal (Preliminary result)   Collection Time   10/04/11  5:00 PM      Component Value Range Status Comment   Specimen Description BLOOD LEFT FOREARM   Final    Special Requests     Final    Value: BOTTLES DRAWN AEROBIC AND ANAEROBIC 3CC BLUE, 5CC RED   Culture  Setup Time [id number]   Final    Culture     Final    Value:        BLOOD CULTURE RECEIVED NO GROWTH TO DATE CULTURE WILL BE HELD FOR 5 DAYS BEFORE ISSUING A FINAL NEGATIVE REPORT   Report Status PENDING   Incomplete     Medical History: Past Medical History  Diagnosis Date  . Anxiety   . Depression   .  Hypertension   . Breast cancer 06/02/07    r breast  . S/P breast lumpectomy 07/04/07    R breast  . S/P radiation therapy 2009    Medications:  Prescriptions prior to admission  Medication Sig Dispense Refill  . ALPRAZolam (XANAX) 1 MG tablet TAKE ONE TO TWO TABS EVERY SIX TO EIGHT HOURS PRN ANXIETY  90 tablet  0  . Calcium Carbonate-Vitamin D (CALCIUM + D PO) Take 600 mg by mouth daily.        . hydrochlorothiazide (HYDRODIURIL) 25 MG tablet TAKE 1 TABLET BY MOUTH EVERY DAY  30 tablet  4  . letrozole (FEMARA) 2.5 MG tablet TAKE 1 TABLET BY MOUTH EVERY DAY  30 tablet  11  . oxyCODONE-acetaminophen (PERCOCET) 5-325 MG per tablet Take 1 tablet by mouth as needed.         Assessment: 56 yo F known to pharmacy from vanc/zosyn protocol from 4/14 to 4/15.  Vanc/Zosyn was changed to ancef 2 gm IV q8. Now to resume vancomycin for worsening pain and erythema.  Goal of Therapy:  Vancomycin trough level 10-15 mcg/ml  Plan:  1. Vancomycin 1000 mg IV q12 h 2. Measure antibiotic drug levels at steady state  Len Childs T 10/07/2011,10:26 AM

## 2011-10-07 NOTE — Progress Notes (Signed)
Patient ID: SIDNI FUSCO, female   DOB: 01-10-1956, 56 y.o.   MRN: [id number] PGY-1 Daily Progress Note Family Medicine Teaching Service Leidi Astle M. Emmersyn Kratzke, MD Service Pager: [phone]  Subjective: Patient's pain and erythema worsened since yesterday. She states the pain started getting worse at 2:00pm yesterday and has continued to worsen. She is still requiring morphine for the pain. Currently on Ancef only.  Objective: Vital signs in last 24 hours: Temp:  [97.6 F (36.4 C)-98 F (36.7 C)] 98 F (36.7 C) (04/17 0542) Pulse Rate:  [66-77] 77  (04/17 0542) Resp:  [18-20] 20  (04/17 0542) BP: (107-120)/(72-85) 107/72 mmHg (04/17 0542) SpO2:  [96 %] 96 % (04/17 0542) Weight change:  Last BM Date: 10/04/11  Intake/Output from previous day: 04/16 0701 - 04/17 0700 In: 3360 [P.O.:3360] Out: 2 [Urine:2] Intake/Output this shift:   Physical Exam: General: pleasant, alert, no acute distress. Lying in bed Lungs: Lungs are clear to auscultation, no crackles or wheezes.  Heart: Regular rate and rhythm. No murmurs, gallops or rubs.  Skin:  Right breast with diffuse bright red erythema and moderate tenderness. No induration or fluctuation noted, although area on lateral lower quadrant with increased firmness. No nipple drainage. No peau de orange or dimpling. No right arm lymphedema. Area marked off with marking pen; recessed from that line. Some dependent area of edema under patient's right arm with extending erythema compared to yesterday. Extremities: Non-tender, No edema Neurologic exam: Nonfocal   Lab Results:  Basename 10/07/11 0530 10/06/11 0615  WBC 6.5 7.4  HGB 12.0 12.2  HCT 35.8* 35.3*  PLT 329 291   BMET  Basename 10/07/11 0530 10/06/11 0615  NA 138 134*  K 4.5 3.5  CL 103 97  CO2 29 27  GLUCOSE 105* 140*  BUN 12 12  CREATININE 0.64 0.62  CALCIUM 9.0 8.6    Studies/Results: No results found.  Medications:  I have reviewed the patient's current  medications. Scheduled:    .  ceFAZolin (ANCEF) IV  2 g Intravenous Q8H  . enoxaparin  40 mg Subcutaneous Q24H  . ibuprofen  600 mg Oral TID  . letrozole  2.5 mg Oral Daily  . potassium chloride  40 mEq Oral BID  . senna-docusate  2 tablet Oral Once   Continuous:  JYN:[id number], acetaminophen, ALPRAZolam, alum & mag hydroxide-simeth, morphine injection, ondansetron (ZOFRAN) IV, ondansetron, oxyCODONE-acetaminophen  Assessment/Plan: 56 y/o c/f with right breast cellulitis and resolving sepsis   1. Right breast cellulitis- This is a recurrent problem for patient and  likely occurred from seeded bacteria because patient has distorted architecture s/p lymphadenectomy/radiation. Patient was improving but has worsened in the last 12 hours - Initially esponded to Vanc and Zosyn. ID consulted. D/C Zosyn and switch to Ancef. Patient continued to improve through yesterday and Vanc this morning was d/c'd. Continued to monitor patient on Ancef only and patient became clinically worse. Will restart Vanc per Dr. Daiva Eves. We truly appreciate ID's consult and care of this patient. - Percocet for pain. Will continue Morphine given her increased pain. - No evidence of abscess on exam; no need for ultrasound at this time - Will plan to transition to orals per ID  2. Sepsis- Meeting criteria at admission with fever of 102 and WBC 24 with source. Now resolved with antibiotics. Afebrile, WBC 6.5 today. - Continue antibiotics as mentioned above - DC'd IV fluids, patient tolerating PO, pressure normal   3. Breast Cancer in remission- Followed as an outpatient. Will  arrange follow up at discharge.   4. HTN- BP stable to low.   - Will hold HCTZ for now. Will restart as needed.  5. Anxiety  - Continue home Xanax   6. Hypokalemia- Noted on admission. Received Kdur 40 meq BID for 4 doses and received 20 meq in 75 cc/hour. K+ within normal limits - Will not continue Kdur. Repeat Bmet in the AM  7. FENGI:  Regular diet, up ad lib, Lovenox 40 mg subq q 24 hours.   8. Dispo: Pending clinical improvement of right breast. Continue to monitor.   LOS: 3 days   Teondre Jarosz 10/07/2011, 10:03 AM

## 2011-10-07 NOTE — Progress Notes (Signed)
Subjective: Erythema in breast worse after stopping vancomycin  Antibiotics:  Anti-infectives     Start     Dose/Rate Route Frequency Ordered Stop   10/07/11 1200   vancomycin (VANCOCIN) IVPB 1000 mg/200 mL premix        1,000 mg 200 mL/hr over 60 Minutes Intravenous Every 12 hours 10/07/11 1025     10/05/11 1500   ceFAZolin (ANCEF) IVPB 2 g/50 mL premix        2 g 100 mL/hr over 30 Minutes Intravenous Every 8 hours 10/05/11 1419     10/05/11 0600   piperacillin-tazobactam (ZOSYN) IVPB 3.375 g  Status:  Discontinued        3.375 g 12.5 mL/hr over 240 Minutes Intravenous Every 8 hours 10/04/11 2343 10/05/11 1419   10/05/11 0600   vancomycin (VANCOCIN) IVPB 1000 mg/200 mL premix  Status:  Discontinued        1,000 mg 200 mL/hr over 60 Minutes Intravenous Every 12 hours 10/04/11 2343 10/06/11 0748   10/04/11 2345   piperacillin-tazobactam (ZOSYN) IVPB 3.375 g  Status:  Discontinued        3.375 g 100 mL/hr over 30 Minutes Intravenous  Once 10/04/11 2343 10/05/11 1419   10/04/11 1900  piperacillin-tazobactam (ZOSYN) IVPB 3.375 g       3.375 g 12.5 mL/hr over 240 Minutes Intravenous  Once 10/04/11 1854 10/04/11 2341   10/04/11 1845   piperacillin-tazobactam (ZOSYN) IVPB 4.5 g  Status:  Discontinued        4.5 g 200 mL/hr over 30 Minutes Intravenous  Once 10/04/11 1838 10/04/11 1854   10/04/11 1735   vancomycin (VANCOCIN) 500 MG powder     Comments: Baughman, Adrienne: cabinet override         10/04/11 1735 10/04/11 1908   10/04/11 1630   vancomycin (VANCOCIN) 1,250 mg in sodium chloride 0.9 % 250 mL IVPB        1,250 mg 166.7 mL/hr over 90 Minutes Intravenous  Once 10/04/11 1624 10/04/11 1926          Medications: Scheduled Meds:    .  ceFAZolin (ANCEF) IV  2 g Intravenous Q8H  . enoxaparin  40 mg Subcutaneous Q24H  . ibuprofen  600 mg Oral TID  . letrozole  2.5 mg Oral Daily  . potassium chloride  40 mEq Oral BID  . senna-docusate  2 tablet Oral Once  . vancomycin   1,000 mg Intravenous Q12H   Continuous Infusions:  PRN Meds:.acetaminophen, acetaminophen, ALPRAZolam, alum & mag hydroxide-simeth, morphine injection, ondansetron (ZOFRAN) IV, ondansetron, oxyCODONE-acetaminophen   Objective: Weight change:   Intake/Output Summary (Last 24 hours) at 10/07/11 1643 Last data filed at 10/07/11 0601  Gross per 24 hour  Intake   1680 ml  Output      0 ml  Net   1680 ml   Blood pressure 124/68, pulse 76, temperature 98.1 F (36.7 C), temperature source Oral, resp. rate 18, height 5\' 9"  (1.753 m), weight 185 lb (83.915 kg), SpO2 97.00%. Temp:  [97.8 F (36.6 C)-98.1 F (36.7 C)] 98.1 F (36.7 C) (04/17 1349) Pulse Rate:  [66-77] 76  (04/17 1349) Resp:  [18-20] 18  (04/17 1349) BP: (107-124)/(68-85) 124/68 mmHg (04/17 1349) SpO2:  [96 %-97 %] 97 % (04/17 1349)  Physical Exam: General: Alert and awake, oriented x3, not in any acute distress.  HEENT: anicteric sclera, pupils reactive to light and accommodation, EOMI, oropharynx clear and without exudate  CVS regular rate, normal r, no murmur  rubs or gallops  Chest: clear to auscultation bilaterally, no wheezing, rales or rhonchi  Abdomen: soft nontender, nondistended, normal bowel sounds,  Extremities: no clubbing or edema noted bilaterally  Skin: right breast is intensely erythematous and tender to palpation. Appears worse vs yesterday Neuro: nonfocal, strength and sensation intact   Lab Results:  Basename 10/07/11 0530 10/06/11 0615  WBC 6.5 7.4  HGB 12.0 12.2  HCT 35.8* 35.3*  PLT 329 291    BMET  Basename 10/07/11 0530 10/06/11 0615  NA 138 134*  K 4.5 3.5  CL 103 97  CO2 29 27  GLUCOSE 105* 140*  BUN 12 12  CREATININE 0.64 0.62  CALCIUM 9.0 8.6    Micro Results: Recent Results (from the past 240 hour(s))  CULTURE, BLOOD (ROUTINE X 2)     Status: Normal (Preliminary result)   Collection Time   10/04/11  5:00 PM      Component Value Range Status Comment   Specimen  Description BLOOD LEFT ARM   Final    Special Requests BOTTLES DRAWN AEROBIC AND ANAEROBIC 5CC EACH   Final    Culture  Setup Time [id number]   Final    Culture     Final    Value:        BLOOD CULTURE RECEIVED NO GROWTH TO DATE CULTURE WILL BE HELD FOR 5 DAYS BEFORE ISSUING A FINAL NEGATIVE REPORT   Report Status PENDING   Incomplete   CULTURE, BLOOD (ROUTINE X 2)     Status: Normal (Preliminary result)   Collection Time   10/04/11  5:00 PM      Component Value Range Status Comment   Specimen Description BLOOD LEFT FOREARM   Final    Special Requests     Final    Value: BOTTLES DRAWN AEROBIC AND ANAEROBIC 3CC BLUE, 5CC RED   Culture  Setup Time [id number]   Final    Culture     Final    Value:        BLOOD CULTURE RECEIVED NO GROWTH TO DATE CULTURE WILL BE HELD FOR 5 DAYS BEFORE ISSUING A FINAL NEGATIVE REPORT   Report Status PENDING   Incomplete     Studies/Results: No results found.    Assessment/Plan: Madison West is a 56 y.o. female   with recurrent right sided breast cellulitis after lumpectomy and axillary LN dissection  1) Recurrent breast cellulitis: Worse off vancomycin --continue vancomycin and cefazolin --when she gets better enough to go to PO would go with keflex and bactrim and then use bactrim alone for reactive therapy (would have good mrsa, mssa and some strep coverage)  2) Screening: check hiv ab    LOS: 3 days   [name] 10/07/2011, 4:43 PM

## 2011-10-08 LAB — BASIC METABOLIC PANEL
BUN: 8 mg/dL (ref 6–23)
Calcium: 8.2 mg/dL — ABNORMAL LOW (ref 8.4–10.5)
Creatinine, Ser: 0.54 mg/dL (ref 0.50–1.10)
GFR calc non Af Amer: >90 mL/min (ref >90)
Glucose, Bld: 327 mg/dL — ABNORMAL HIGH (ref 70–99)
Sodium: 131 mEq/L — ABNORMAL LOW (ref 135–145)

## 2011-10-08 LAB — CBC
Hemoglobin: 12.4 g/dL (ref 12.0–15.0)
MCH: 31 pg (ref 26.0–34.0)
MCHC: 33.2 g/dL (ref 30.0–36.0)
MCV: 93.3 fL (ref 78.0–100.0)

## 2011-10-08 MED ORDER — POLYETHYLENE GLYCOL 3350 17 G PO PACK
17.0000 g | PACK | Freq: Every day | ORAL | Status: DC | PRN
  Administered 2011-10-09 – 2011-10-10 (×2): 17 g via ORAL
  Filled 2011-10-08 (×2): qty 1

## 2011-10-08 NOTE — Plan of Care (Signed)
PGY-1 Progress Note  Unable to obtain breast MRI at this facility. Will order breast ultrasound to evaluate for abscess. If there is still a concern, patient will need to be transferred to [facility] for MRI, but I do not anticipate this to be the case.   If patient does not have abscess and continues to improve, will narrow to Keflex and Bactrim, per Dr. Zenaida Niece Dam's recommendations.  Wilma Wuthrich M. Chiana Wamser, M.D. 10/08/2011 4:12 PM

## 2011-10-08 NOTE — Progress Notes (Signed)
Subjective: Erythema in breast stable after vancomycin restarted  Antibiotics:  Anti-infectives     Start     Dose/Rate Route Frequency Ordered Stop   10/07/11 1200   vancomycin (VANCOCIN) IVPB 1000 mg/200 mL premix        1,000 mg 200 mL/hr over 60 Minutes Intravenous Every 12 hours 10/07/11 1025     10/05/11 1500   ceFAZolin (ANCEF) IVPB 2 g/50 mL premix        2 g 100 mL/hr over 30 Minutes Intravenous Every 8 hours 10/05/11 1419     10/05/11 0600   piperacillin-tazobactam (ZOSYN) IVPB 3.375 g  Status:  Discontinued        3.375 g 12.5 mL/hr over 240 Minutes Intravenous Every 8 hours 10/04/11 2343 10/05/11 1419   10/05/11 0600   vancomycin (VANCOCIN) IVPB 1000 mg/200 mL premix  Status:  Discontinued        1,000 mg 200 mL/hr over 60 Minutes Intravenous Every 12 hours 10/04/11 2343 10/06/11 0748   10/04/11 2345   piperacillin-tazobactam (ZOSYN) IVPB 3.375 g  Status:  Discontinued        3.375 g 100 mL/hr over 30 Minutes Intravenous  Once 10/04/11 2343 10/05/11 1419   10/04/11 1900  piperacillin-tazobactam (ZOSYN) IVPB 3.375 g       3.375 g 12.5 mL/hr over 240 Minutes Intravenous  Once 10/04/11 1854 10/04/11 2341   10/04/11 1845   piperacillin-tazobactam (ZOSYN) IVPB 4.5 g  Status:  Discontinued        4.5 g 200 mL/hr over 30 Minutes Intravenous  Once 10/04/11 1838 10/04/11 1854   10/04/11 1735   vancomycin (VANCOCIN) 500 MG powder     Comments: Baughman, Adrienne: cabinet override         10/04/11 1735 10/04/11 1908   10/04/11 1630   vancomycin (VANCOCIN) 1,250 mg in sodium chloride 0.9 % 250 mL IVPB        1,250 mg 166.7 mL/hr over 90 Minutes Intravenous  Once 10/04/11 1624 10/04/11 1926          Medications: Scheduled Meds:    .  ceFAZolin (ANCEF) IV  2 g Intravenous Q8H  . enoxaparin  40 mg Subcutaneous Q24H  . ibuprofen  600 mg Oral TID  . letrozole  2.5 mg Oral Daily  . senna-docusate  2 tablet Oral Once  . vancomycin  1,000 mg Intravenous Q12H    Continuous Infusions:  PRN Meds:.acetaminophen, acetaminophen, ALPRAZolam, alum & mag hydroxide-simeth, morphine injection, ondansetron (ZOFRAN) IV, ondansetron, oxyCODONE-acetaminophen, polyethylene glycol   Objective: Weight change:   Intake/Output Summary (Last 24 hours) at 10/08/11 1016 Last data filed at 10/08/11 1610  Gross per 24 hour  Intake   3700 ml  Output      0 ml  Net   3700 ml   Blood pressure 145/86, pulse 68, temperature 96.5 F (35.8 C), temperature source Axillary, resp. rate 18, height 5\' 9"  (1.753 m), weight 185 lb (83.915 kg), SpO2 97.00%. Temp:  [96.5 F (35.8 C)-98.1 F (36.7 C)] 96.5 F (35.8 C) (04/18 0620) Pulse Rate:  [68-76] 68  (04/18 0620) Resp:  [18] 18  (04/18 0620) BP: (120-145)/(68-86) 145/86 mmHg (04/18 0620) SpO2:  [97 %-98 %] 97 % (04/18 0620)  Physical Exam: General: Alert and awake, oriented x3, not in any acute distress.  HEENT: anicteric sclera, pupils reactive to light and accommodation, EOMI, oropharynx clear and without exudate  CVS regular rate, normal r, no murmur rubs or gallops  Chest: clear to  auscultation bilaterally, no wheezing, rales or rhonchi  Abdomen: soft nontender, nondistended, normal bowel sounds,  Extremities: no clubbing or edema noted bilaterally  Skin: right breast isstill with intensely erythematous and tender to palpation. Neuro: nonfocal, strength and sensation intact   Lab Results:  Basename 10/08/11 0648 10/07/11 0530  WBC 6.3 6.5  HGB 12.4 12.0  HCT 37.3 35.8*  PLT 348 329    BMET  Basename 10/08/11 0648 10/07/11 0530  NA 131* 138  K 3.7 4.5  CL 88* 103  CO2 25 29  GLUCOSE 327* 105*  BUN 8 12  CREATININE 0.54 0.64  CALCIUM 8.2* 9.0    Micro Results: Recent Results (from the past 240 hour(s))  CULTURE, BLOOD (ROUTINE X 2)     Status: Normal (Preliminary result)   Collection Time   10/04/11  5:00 PM      Component Value Range Status Comment   Specimen Description BLOOD LEFT ARM    Final    Special Requests BOTTLES DRAWN AEROBIC AND ANAEROBIC 5CC EACH   Final    Culture  Setup Time [id number]   Final    Culture     Final    Value:        BLOOD CULTURE RECEIVED NO GROWTH TO DATE CULTURE WILL BE HELD FOR 5 DAYS BEFORE ISSUING A FINAL NEGATIVE REPORT   Report Status PENDING   Incomplete   CULTURE, BLOOD (ROUTINE X 2)     Status: Normal (Preliminary result)   Collection Time   10/04/11  5:00 PM      Component Value Range Status Comment   Specimen Description BLOOD LEFT FOREARM   Final    Special Requests     Final    Value: BOTTLES DRAWN AEROBIC AND ANAEROBIC 3CC BLUE, 5CC RED   Culture  Setup Time [id number]   Final    Culture     Final    Value:        BLOOD CULTURE RECEIVED NO GROWTH TO DATE CULTURE WILL BE HELD FOR 5 DAYS BEFORE ISSUING A FINAL NEGATIVE REPORT   Report Status PENDING   Incomplete     Studies/Results: No results found.    Assessment/Plan: Madison West is a 56 y.o. female   with recurrent right sided breast cellulitis after lumpectomy and axillary LN dissection  1) Recurrent breast cellulitis: Worse off vancomycin suggesting MRSA --continue vancomycin and cefazolin --CONSIDER MRI RIGHT BREAST TO ENSURE THAT SHE DIDN'T DEVELOP AN ABSCESS THIS TIME --when she gets better enough to go to PO would go with keflex and bactrim and then use bactrim alone for reactive therapy (would have good mrsa, mssa and some strep coverage)  2) Screening: check hiv ab    LOS: 4 days   [name] 10/08/2011, 10:16 AM

## 2011-10-08 NOTE — Progress Notes (Signed)
Family Medicine Teaching Service Attending Note  I interviewed and examined patient Madison West and reviewed their tests and x-rays.  I discussed with Dr. Mikel Cella and reviewed their note for today.  I agree with their assessment and plan.     Additionally  Afebrile feeling some better MRI as per ID  If no abscess change to oral and watch 24 hours given her history of prolonged response times to antibiotics in the past

## 2011-10-08 NOTE — Progress Notes (Signed)
Patient ID: Madison West, female   DOB: 08/07/1955, 56 y.o.   MRN: [id number] PGY-1 Daily Progress Note Family Medicine Teaching Service Madison West M. Madison Starry, MD Service Pager: [phone]  Subjective: Patient's pain and swelling improved. States heat packs and pillows help with pain. Has not been OOB secondary to discomfort. Afebrile >24 hours  Objective: Vital signs in last 24 hours: Temp:  [96.5 F (35.8 C)-98.1 F (36.7 C)] 96.5 F (35.8 C) (04/18 0620) Pulse Rate:  [68-76] 68  (04/18 0620) Resp:  [18] 18  (04/18 0620) BP: (120-145)/(68-86) 145/86 mmHg (04/18 0620) SpO2:  [97 %-98 %] 97 % (04/18 0620) Weight change:  Last BM Date: 10/04/11 ([place] given)  Intake/Output from previous day: 04/17 0701 - 04/18 0700 In: 3700 [P.O.:3400; IV Piggyback:300] Out: -  Intake/Output this shift:   Physical Exam: General: pleasant, alert, no acute distress. Lying in bed Lungs: Lungs are clear to auscultation, no crackles or wheezes.  Heart: Regular rate and rhythm. No murmurs, gallops or rubs.  Skin:  Right breast with diffuse bright red erythema and moderate tenderness, receeded from line drawn yesterday.. No induration or fluctuation noted. No nipple drainage. No peau de orange or dimpling. No right arm lymphedema. Some dependent area of edema under patient's right arm with extending erythema compared to yesterday. Extremities: Non-tender, No edema Neurologic exam: Nonfocal   Lab Results:  Basename 10/08/11 0648 10/07/11 0530  WBC 6.3 6.5  HGB 12.4 12.0  HCT 37.3 35.8*  PLT 348 329   BMET  Basename 10/08/11 0648 10/07/11 0530  NA 131* 138  K 3.7 4.5  CL 88* 103  CO2 25 29  GLUCOSE 327* 105*  BUN 8 12  CREATININE 0.54 0.64  CALCIUM 8.2* 9.0    Studies/Results: No results found.  Medications:  I have reviewed the patient's current medications. Scheduled:    .  ceFAZolin (ANCEF) IV  2 g Intravenous Q8H  . enoxaparin  40 mg Subcutaneous Q24H  . ibuprofen  600 mg  Oral TID  . letrozole  2.5 mg Oral Daily  . senna-docusate  2 tablet Oral Once  . vancomycin  1,000 mg Intravenous Q12H   Continuous:  HQI:[id number], acetaminophen, ALPRAZolam, alum & mag hydroxide-simeth, morphine injection, ondansetron (ZOFRAN) IV, ondansetron, oxyCODONE-acetaminophen  Assessment/Plan: 56 y/o c/f c/f with right breast cellulitis and resolving sepsis   1. Right breast cellulitis- This is a recurrent problem for patient and  likely occurred from seeded bacteria because patient has distorted architecture s/p lymphadenectomy/radiation. Patient was improving but has worsened in the last 12 hours - Initially esponded to Vanc and Zosyn. ID consulted. D/C Zosyn and switch to Ancef. Patient continued to improve. Vanc d/c'd and continued to monitor patient on Ancef only and patient became clinically worse. Vanc restarted yesterday per Dr. Daiva Eves. We truly appreciate ID's consult and care of this patient. Currently on Ancef and Vanc - MRI of breast to evaluate for abscess - Percocet for pain. Will continue Morphine given her increased pain. - No evidence of abscess on exam; no need for ultrasound at this time - Will plan to transition to orals per ID (Keflex and Bactrim)  2. Sepsis- Meeting criteria at admission with fever of 102 and WBC 24 with source. Now resolved with antibiotics. Afebrile, WBC 6.4 today. - Continue antibiotics as mentioned above - DC'd IV fluids, patient tolerating PO, pressure normal  - Overall improved  3. Breast Cancer in remission- Followed as an outpatient. Will arrange follow up at discharge.  4. HTN- BP stable and HCTZ held after admission. BP elevated toda - Restart HCTZ  5. Anxiety  - Continue home Xanax prn  6. Hypokalemia- Noted on admission. Received Kdur 40 meq BID for 4 doses and received 20 meq in 75 cc/hour. K+ within normal limits - Will not continue Kdur. Repeat Bmet in the AM  7. FENGI: Regular diet, up ad lib, Lovenox 40 mg subq q  24 hours.   8. Dispo: Pending clinical improvement of right breast and tolerating PO. Will follow up MRI today.   LOS: 4 days   Madison West 10/08/2011, 8:46 AM

## 2011-10-08 NOTE — Progress Notes (Signed)
Utilization review completed. Jackilyn Umphlett, RN, BSN. 10/08/11  

## 2011-10-09 MED ORDER — HYDROCHLOROTHIAZIDE 25 MG PO TABS
25.0000 mg | ORAL_TABLET | Freq: Every day | ORAL | Status: DC
  Administered 2011-10-09 – 2011-10-11 (×3): 25 mg via ORAL
  Filled 2011-10-09 (×3): qty 1

## 2011-10-09 MED ORDER — CEPHALEXIN 500 MG PO CAPS
500.0000 mg | ORAL_CAPSULE | Freq: Four times a day (QID) | ORAL | Status: DC
  Administered 2011-10-09 – 2011-10-11 (×8): 500 mg via ORAL
  Filled 2011-10-09 (×12): qty 1

## 2011-10-09 MED ORDER — MORPHINE SULFATE 2 MG/ML IJ SOLN
2.0000 mg | INTRAMUSCULAR | Status: DC | PRN
  Administered 2011-10-09: 2 mg via INTRAVENOUS
  Filled 2011-10-09: qty 1

## 2011-10-09 MED ORDER — SENNA 8.6 MG PO TABS
2.0000 | ORAL_TABLET | Freq: Every day | ORAL | Status: DC
  Administered 2011-10-09 – 2011-10-11 (×3): 17.2 mg via ORAL
  Filled 2011-10-09 (×3): qty 2

## 2011-10-09 MED ORDER — OXYCODONE-ACETAMINOPHEN 5-325 MG PO TABS
2.0000 | ORAL_TABLET | ORAL | Status: DC | PRN
  Administered 2011-10-09 – 2011-10-11 (×9): 2 via ORAL
  Filled 2011-10-09 (×9): qty 2

## 2011-10-09 MED ORDER — SULFAMETHOXAZOLE-TMP DS 800-160 MG PO TABS
2.0000 | ORAL_TABLET | Freq: Two times a day (BID) | ORAL | Status: DC
  Administered 2011-10-09 – 2011-10-11 (×4): 2 via ORAL
  Filled 2011-10-09 (×6): qty 2

## 2011-10-09 MED ORDER — SULFAMETHOXAZOLE-TMP DS 800-160 MG PO TABS
1.0000 | ORAL_TABLET | Freq: Two times a day (BID) | ORAL | Status: DC
Start: 2011-10-09 — End: 2011-10-09
  Administered 2011-10-09: 1 via ORAL
  Filled 2011-10-09 (×2): qty 1

## 2011-10-09 NOTE — Progress Notes (Signed)
Subjective: Pt upset that MRI and [place] cannot be done at [facility] also anxious about change to po abx Antibiotics:  Anti-infectives     Start     Dose/Rate Route Frequency Ordered Stop   10/09/11 2200  sulfamethoxazole-trimethoprim (BACTRIM DS) 800-160 MG per tablet 2 tablet       2 tablet Oral Every 12 hours 10/09/11 1556     10/09/11 1200   cephALEXin (KEFLEX) capsule 500 mg        500 mg Oral 4 times per day 10/09/11 1002     10/09/11 1200   sulfamethoxazole-trimethoprim (BACTRIM DS) 800-160 MG per tablet 1 tablet  Status:  Discontinued        1 tablet Oral Every 12 hours 10/09/11 1002 10/09/11 1556   10/07/11 1200   vancomycin (VANCOCIN) IVPB 1000 mg/200 mL premix  Status:  Discontinued        1,000 mg 200 mL/hr over 60 Minutes Intravenous Every 12 hours 10/07/11 1025 10/09/11 1002   10/05/11 1500   ceFAZolin (ANCEF) IVPB 2 g/50 mL premix  Status:  Discontinued        2 g 100 mL/hr over 30 Minutes Intravenous Every 8 hours 10/05/11 1419 10/09/11 1002   10/05/11 0600   piperacillin-tazobactam (ZOSYN) IVPB 3.375 g  Status:  Discontinued        3.375 g 12.5 mL/hr over 240 Minutes Intravenous Every 8 hours 10/04/11 2343 10/05/11 1419   10/05/11 0600   vancomycin (VANCOCIN) IVPB 1000 mg/200 mL premix  Status:  Discontinued        1,000 mg 200 mL/hr over 60 Minutes Intravenous Every 12 hours 10/04/11 2343 10/06/11 0748   10/04/11 2345   piperacillin-tazobactam (ZOSYN) IVPB 3.375 g  Status:  Discontinued        3.375 g 100 mL/hr over 30 Minutes Intravenous  Once 10/04/11 2343 10/05/11 1419   10/04/11 1900   piperacillin-tazobactam (ZOSYN) IVPB 3.375 g        3.375 g 12.5 mL/hr over 240 Minutes Intravenous  Once 10/04/11 1854 10/04/11 2341   10/04/11 1845   piperacillin-tazobactam (ZOSYN) IVPB 4.5 g  Status:  Discontinued        4.5 g 200 mL/hr over 30 Minutes Intravenous  Once 10/04/11 1838 10/04/11 1854   10/04/11 1735   vancomycin (VANCOCIN) 500 MG powder     Comments: Baughman,  Adrienne: cabinet override         10/04/11 1735 10/04/11 1908   10/04/11 1630   vancomycin (VANCOCIN) 1,250 mg in sodium chloride 0.9 % 250 mL IVPB        1,250 mg 166.7 mL/hr over 90 Minutes Intravenous  Once 10/04/11 1624 10/04/11 1926          Medications: Scheduled Meds:    . cephALEXin  500 mg Oral Q6H  . enoxaparin  40 mg Subcutaneous Q24H  . hydrochlorothiazide  25 mg Oral Daily  . ibuprofen  600 mg Oral TID  . letrozole  2.5 mg Oral Daily  . senna  2 tablet Oral Daily  . sulfamethoxazole-trimethoprim  2 tablet Oral Q12H  . DISCONTD:  ceFAZolin (ANCEF) IV  2 g Intravenous Q8H  . DISCONTD: sulfamethoxazole-trimethoprim  1 tablet Oral Q12H  . DISCONTD: vancomycin  1,000 mg Intravenous Q12H   Continuous Infusions:  PRN Meds:.acetaminophen, acetaminophen, ALPRAZolam, alum & mag hydroxide-simeth, morphine injection, ondansetron (ZOFRAN) IV, ondansetron, oxyCODONE-acetaminophen, polyethylene glycol, DISCONTD:  morphine injection, DISCONTD: oxyCODONE-acetaminophen   Objective: Weight change:   Intake/Output Summary (Last 24  hours) at 10/09/11 1707 Last data filed at 10/09/11 1610  Gross per 24 hour  Intake    900 ml  Output      0 ml  Net    900 ml   Blood pressure 150/87, pulse 71, temperature 97 F (36.1 C), temperature source Axillary, resp. rate 18, height 5\' 9"  (1.753 m), weight 185 lb (83.915 kg), SpO2 98.00%. Temp:  [97 F (36.1 C)-97.7 F (36.5 C)] 97 F (36.1 C) (04/19 1300) Pulse Rate:  [71-75] 71  (04/19 1300) Resp:  [18-20] 18  (04/19 1300) BP: (125-150)/(78-87) 150/87 mmHg (04/19 1300) SpO2:  [94 %-98 %] 98 % (04/19 1300)  Physical Exam: General: Alert and awake, oriented x3, not in any acute distress.  HEENT: anicteric sclera, pupils reactive to light and accommodation, EOMI, oropharynx clear and without exudate  CVS regular rate, normal r, no murmur rubs or gallops  Chest: clear to auscultation bilaterally, no wheezing, rales or rhonchi    Abdomen: soft nontender, nondistended, normal bowel sounds,  Extremities: no clubbing or edema noted bilaterally  Skin: right breast isstill with intensely erythematous and tender to palpation. Neuro: nonfocal, strength and sensation intact   Lab Results:  Basename 10/08/11 0648 10/07/11 0530  WBC 6.3 6.5  HGB 12.4 12.0  HCT 37.3 35.8*  PLT 348 329    BMET  Basename 10/08/11 0648 10/07/11 0530  NA 131* 138  K 3.7 4.5  CL 88* 103  CO2 25 29  GLUCOSE 327* 105*  BUN 8 12  CREATININE 0.54 0.64  CALCIUM 8.2* 9.0    Micro Results: Recent Results (from the past 240 hour(s))  CULTURE, BLOOD (ROUTINE X 2)     Status: Normal (Preliminary result)   Collection Time   10/04/11  5:00 PM      Component Value Range Status Comment   Specimen Description BLOOD LEFT ARM   Final    Special Requests BOTTLES DRAWN AEROBIC AND ANAEROBIC 5CC EACH   Final    Culture  Setup Time [id number]   Final    Culture     Final    Value:        BLOOD CULTURE RECEIVED NO GROWTH TO DATE CULTURE WILL BE HELD FOR 5 DAYS BEFORE ISSUING A FINAL NEGATIVE REPORT   Report Status PENDING   Incomplete   CULTURE, BLOOD (ROUTINE X 2)     Status: Normal (Preliminary result)   Collection Time   10/04/11  5:00 PM      Component Value Range Status Comment   Specimen Description BLOOD LEFT FOREARM   Final    Special Requests     Final    Value: BOTTLES DRAWN AEROBIC AND ANAEROBIC 3CC BLUE, 5CC RED   Culture  Setup Time [id number]   Final    Culture     Final    Value:        BLOOD CULTURE RECEIVED NO GROWTH TO DATE CULTURE WILL BE HELD FOR 5 DAYS BEFORE ISSUING A FINAL NEGATIVE REPORT   Report Status PENDING   Incomplete     Studies/Results: No results found.    Assessment/Plan: Madison West is a 56 y.o. female   with recurrent right sided breast cellulitis after lumpectomy and axillary LN dissection  1) Recurrent breast cellulitis: Worse off vancomycin suggesting MRSA now changed to po  keflex and  bactrim   --would increase dose of DS bactrim to TWO DS bid (better for MRSA infection) along with the keflex ---I would  give her a protracted course of up to 3 to 4 weeks --she will need fu in [facility] with [place] before stopping her oral antibiotics --I would  then use bactrim alone for reactive therapy (would have good mrsa, mssa and some strep coverage)  Dr. Drue Second will be covering this weekend and is available for questions.      LOS: 5 days   [name] 10/09/2011, 5:07 PM

## 2011-10-09 NOTE — Progress Notes (Signed)
Patient ID: Madison West, female   DOB: 09-28-1955, 56 y.o.   MRN: [id number] PGY-1 Daily Progress Note Family Medicine Teaching Service Madison Aguinaldo M. [name], MD Service Pager: [phone]  Subjective: Patient continues to improve. States she has a lot of pain with movement and has not been OOB much. Heat helps with the pain.  Objective: Vital signs in last 24 hours: Temp:  [97.5 F (36.4 C)-98.2 F (36.8 C)] 97.7 F (36.5 C) (04/19 0451) Pulse Rate:  [71-89] 71  (04/19 0451) Resp:  [18-20] 18  (04/19 0451) BP: (125-138)/(73-84) 138/84 mmHg (04/19 0451) SpO2:  [94 %-95 %] 94 % (04/19 0451) Weight change:  Last BM Date: 10/04/11  Intake/Output from previous day: 04/18 0701 - 04/19 0700 In: 900 [P.O.:600; IV Piggyback:300] Out: -  Intake/Output this shift:   Physical Exam: General: pleasant, alert, no acute distress. Lying in bed Lungs: Lungs are clear to auscultation, no crackles or wheezes.  Heart: Regular rate and rhythm. No murmurs, gallops or rubs.  Skin:  Right breast with diffuse bright red erythema and moderate tenderness, receeded from line drawn. No induration or fluctuation noted. No nipple drainage. No peau de orange or dimpling. No right arm lymphedema. Some dependent area of edema under patient's right arm which has improved. Extremities: Non-tender, No edema Neurologic exam: Nonfocal   Lab Results:  Basename 10/08/11 0648 10/07/11 0530  WBC 6.3 6.5  HGB 12.4 12.0  HCT 37.3 35.8*  PLT 348 329   BMET  Basename 10/08/11 0648 10/07/11 0530  NA 131* 138  K 3.7 4.5  CL 88* 103  CO2 25 29  GLUCOSE 327* 105*  BUN 8 12  CREATININE 0.54 0.64  CALCIUM 8.2* 9.0    Studies/Results: No results found.  Medications:  I have reviewed the patient's current medications. Scheduled:    .  ceFAZolin (ANCEF) IV  2 g Intravenous Q8H  . enoxaparin  40 mg Subcutaneous Q24H  . ibuprofen  600 mg Oral TID  . letrozole  2.5 mg Oral Daily  . vancomycin  1,000 mg  Intravenous Q12H   Continuous:  JYN:[id number], acetaminophen, ALPRAZolam, alum & mag hydroxide-simeth, morphine injection, ondansetron (ZOFRAN) IV, ondansetron, oxyCODONE-acetaminophen, polyethylene glycol  Assessment/Plan: 56 y/o c/f with right breast cellulitis and resolving sepsis   1. Right breast cellulitis- This is a recurrent problem for patient and  likely occurred from seeded bacteria because patient has distorted architecture s/p lymphadenectomy/radiation. Patient was improving but has worsened in the last 12 hours - Initially esponded to Vanc and Zosyn. ID consulted. D/C Zosyn and switch to Ancef. Patient continued to improve. Vanc d/c'd and continued to monitor patient on Ancef only and patient became clinically worse. Vanc restarted yesterday per Dr. Daiva West. We truly appreciate ID's consult and care of this patient. Currently on Ancef and Vanc - MRI of breast to evaluate for abscess cannot be performed at [facility], neither can an ultrasound. I contacted the radiologist at the [facility] [place] today. After hearing her complicated history, he would like for her to have an [facility]. He states if she has an abscess, he would be able to drain it there. I really appreciate his help with this matter.  - Percocet for pain Will decrease Morphine dose in anticipation of discharge within the next 24-48 hours - Will plan to transition to orals today  (Keflex and Bactrim)  2. Sepsis- Meeting criteria at admission with fever of 102 and WBC 24  with source. Now resolved with antibiotics. Afebrile, WBC 6.4 today. - Continue antibiotics as mentioned above - DC'd IV fluids, patient tolerating PO, pressure normal  - Overall improved  3. Breast Cancer in remission- Followed as an outpatient. Will arrange follow up at discharge, as well as appointment at [facility] as mentioned above   4. HTN- BP stable and HCTZ held after admission.  BP elevated today - Restart HCTZ  5. Anxiety  - Continue home Xanax prn  6. Hypokalemia- Noted on admission. Received Kdur 40 meq BID for 4 doses and received 20 meq in 75 cc/hour. K+ within normal limits - Will not continue Kdur. Repeat Bmet in the AM  7. FENGI: Regular diet, up ad lib, Lovenox 40 mg subq q 24 hours.   8. Dispo: Pending clinical improvement of right breast after transitioning to oral antibiotics.   LOS: 5 days   Madison West 10/09/2011, 7:50 AM

## 2011-10-09 NOTE — Progress Notes (Signed)
Family Medicine Teaching Service Attending Note  I interviewed and examined patient Madison West and reviewed their tests and x-rays.  I discussed with Dr. Mikel Cella and reviewed their note for today.  I agree with their assessment and plan.     Additionally  Steady progress Change to oral antibiotics and observe overnight

## 2011-10-10 LAB — CBC
HCT: 40.2 % (ref 36.0–46.0)
Hemoglobin: 13.5 g/dL (ref 12.0–15.0)
MCH: 31 pg (ref 26.0–34.0)
MCHC: 33.6 g/dL (ref 30.0–36.0)
RBC: 4.36 MIL/uL (ref 3.87–5.11)

## 2011-10-10 LAB — CULTURE, BLOOD (ROUTINE X 2)
Culture  Setup Time: 201304142030
Culture: NO GROWTH
Culture: NO GROWTH

## 2011-10-10 LAB — BASIC METABOLIC PANEL
BUN: 14 mg/dL (ref 6–23)
Chloride: 97 mEq/L (ref 96–112)
Glucose, Bld: 100 mg/dL — ABNORMAL HIGH (ref 70–99)
Potassium: 3.7 mEq/L (ref 3.5–5.1)
Sodium: 135 mEq/L (ref 135–145)

## 2011-10-10 NOTE — Progress Notes (Signed)
Patient ID: Madison West, female   DOB: 29-Aug-1955, 56 y.o.   MRN: [id number]  PGY-1 Daily Progress Note Family Medicine Teaching Service Madison West M. Bolivar Koranda, MD Service Pager: [phone]  Subjective: Patient states she is still in pain. She is confused over why she cannot have imaging of her breast while she is here. Transitioned to PO antibiotics and tolerating well.   Objective: Vital signs in last 24 hours: Temp:  [97 F (36.1 C)-98.6 F (37 C)] 97.5 F (36.4 C) (04/20 0724) Pulse Rate:  [70-92] 92  (04/20 0724) Resp:  [18] 18  (04/20 0724) BP: (136-160)/(80-88) 160/80 mmHg (04/20 0724) SpO2:  [93 %-98 %] 93 % (04/20 0724) Weight change:  Last BM Date: 10/04/11  Intake/Output from previous day: 04/19 0701 - 04/20 0700 In: 1080 [P.O.:1080] Out: -  Intake/Output this shift:   Physical Exam: General: pleasant, alert, no acute distress. Lying in bed Lungs: Lungs are clear to auscultation, no crackles or wheezes.  Heart: Regular rate and rhythm. No murmurs, gallops or rubs.  Skin:  Right breast with diffuse erythema and some tenderness, receeded from line drawn. No induration or fluctuation noted. No nipple drainage. No peau de orange or dimpling. No right arm lymphedema. Some dependent area of edema under patient's right arm which has improved. Extremities: Non-tender, No edema Neurologic exam: Nonfocal   Lab Results:  Basename 10/10/11 0625 10/08/11 0648  WBC 6.5 6.3  HGB 13.5 12.4  HCT 40.2 37.3  PLT 392 348   BMET  Basename 10/10/11 0625 10/08/11 0648  NA 135 131*  K 3.7 3.7  CL 97 88*  CO2 29 25  GLUCOSE 100* 327*  BUN 14 8  CREATININE 0.84 0.54  CALCIUM 9.5 8.2*    Studies/Results: No results found.  Medications:  I have reviewed the patient's current medications. Scheduled:    . cephALEXin  500 mg Oral Q6H  . enoxaparin  40 mg Subcutaneous Q24H  . hydrochlorothiazide  25 mg Oral Daily  . ibuprofen  600 mg Oral TID  . letrozole  2.5 mg Oral  Daily  . senna  2 tablet Oral Daily  . sulfamethoxazole-trimethoprim  2 tablet Oral Q12H  . DISCONTD:  ceFAZolin (ANCEF) IV  2 g Intravenous Q8H  . DISCONTD: sulfamethoxazole-trimethoprim  1 tablet Oral Q12H  . DISCONTD: vancomycin  1,000 mg Intravenous Q12H   Continuous:  JYN:WGNFAOZHYQMVH, acetaminophen, ALPRAZolam, alum & mag hydroxide-simeth, morphine injection, ondansetron (ZOFRAN) IV, ondansetron, oxyCODONE-acetaminophen, polyethylene glycol, DISCONTD:  morphine injection, DISCONTD: oxyCODONE-acetaminophen  Assessment/Plan: 56 y/o c/f with right breast cellulitis and resolving sepsis   1. Right breast cellulitis- This is a recurrent problem for patient and  likely occurred from seeded bacteria because patient has distorted architecture s/p lymphadenectomy/radiation. Patient was improving but has worsened in the last 12 hours - Initially esponded to Vanc and Zosyn. ID consulted. D/C Zosyn and switch to Ancef. Patient continued to improve. Vanc d/c'd and continued to monitor patient on Ancef only and patient became clinically worse. Vanc restarted yesterday per Dr. Daiva Eves. We truly appreciate ID's consult and care of this patient. Currently on Ancef and Vanc - MRI of breast to evaluate for abscess cannot be performed at [facility], neither can an ultrasound. I contacted the radiologist at the [facility] [place] today. After hearing her complicated history, he would like for her to have an outpatient appointment with [facility] for [facility]. He states if she has an abscess, he would be able to drain it  there. I really appreciate his help with this matter. I discussed this with patient yesterday when I spoke with the radiologist, as well as today.  Although I do not fully understand this matter either, it is out of our control. She should have close follow up for breast imaging as an outpatient.  - Percocet for pain, which seems to be going well. Continue IBU - Transitioned to PO  Bactrim DS and Keflex yesterday. She will continue these for 3-4 weeks and follow up with ID clinic - Kpad as needed to the area for symptomatic relief  2. Sepsis- Meeting criteria at admission with fever of 102 and WBC 24 with source. Now resolved with antibiotics. Afebrile, WBC 6.4 today. - Continue antibiotics as mentioned above - DC'd IV fluids, patient tolerating PO, pressure normal  - Overall improved  3. Breast Cancer in remission- Followed as an outpatient. Will arrange follow up at discharge, as well as appointment at [facility] as mentioned above   4. HTN- BP stable and HCTZ held after admission. BP elevated today - Restarted HCTZ - Continue to monitor.  5. Anxiety  - Continue home Xanax prn  6. Hypokalemia- Noted on admission. Received Kdur 40 meq BID for 4 doses and received 20 meq in 75 cc/hour. K+ within normal limits  7. FENGI: Regular diet, up ad lib, Lovenox 40 mg subq q 24 hours. D/C PIV  8. Dispo: Anticipated discharge within the next 24-48 hours. Patient tolerating PO, would encourage her to ambulate more.   LOS: 6 days   Shayn Madole 10/10/2011, 9:47 AM

## 2011-10-10 NOTE — Progress Notes (Signed)
Family Medicine Teaching Service Attending Note  I interviewed and examined patient Madison West and reviewed their tests and x-rays.  I discussed with Dr. Mikel Cella and reviewed their note for today.  I agree with their assessment and plan.     Additionally  Still significant pain Erythema decresed Tolerating oral medications Watch overnight on orals due to history of rapid worsening and to ensure adequate coverage

## 2011-10-11 LAB — CREATININE, SERUM: GFR calc non Af Amer: 59 mL/min — ABNORMAL LOW (ref >90)

## 2011-10-11 MED ORDER — OXYCODONE-ACETAMINOPHEN 5-325 MG PO TABS: 1.0000 | ORAL_TABLET | ORAL | Status: DC | PRN

## 2011-10-11 MED ORDER — SULFAMETHOXAZOLE-TMP DS 800-160 MG PO TABS: 2.0000 | ORAL_TABLET | Freq: Two times a day (BID) | ORAL | Status: DC

## 2011-10-11 MED ORDER — CEPHALEXIN 500 MG PO CAPS: 500.0000 mg | ORAL_CAPSULE | Freq: Four times a day (QID) | ORAL | Status: DC

## 2011-10-11 NOTE — Discharge Planning (Cosign Needed)
Physician Discharge Summary  Patient ID: Madison West MRN: [id number] DOB/AGE: 07-01-1955 56 y.o.  Admit date: 10/04/2011 Discharge date: 10/11/2011  Admission Diagnoses: R breast cellulitis   Discharge Diagnoses:  Cellulitis of breast Breast cancer S/P breast lumpectomy and axillary lymph node ectomy  Hypertension Anxiety Depression   Discharged Condition: fair  Hospital Course: 56 yo F presented with acute onset of redness adn swelling in her R breast concerning for recurrent cellulitis.   1. R breast cellulitis:  -Consulted ID given recurrent nature of problem. Given the quick onset of symptoms the thought is that the causative organism is likely strep. The patient was treated with Zosyn, Vancomycin and Ancef x 4 days. On hospital day #3 she was transiionted to Ancef only. She developed worsening pain and cellulitis and had to be restarted on Vancomycin, although she remained afebrile and her WBC was within normal limits. On hospital day # 6 she was transitioned to oral Kefex and Bactrim to complete a 3 week course per ID recommendations.   Consults: ID (Dr. Paulette Blanch Dam)  Significant Diagnostic Studies:  WBC 12 to 6.5 at discharge Blood cultures: no growth x 5 days x 2 sets.  HIV: non reactive Hg A1c: 5.6  Glucose: 100 on BMET 10/10/11  Treatments: antibiotics: Ancef, vancomycin and Zosyn and analgesia: Morphine and Percocet  Discharge Exam: Blood pressure 123/81, pulse 80, temperature 97.8 F (36.6 C), temperature source Oral, resp. rate 18, height 5\' 9"  (1.753 m), weight 185 lb (83.915 kg), SpO2 97.00%. General appearance: alert, cooperative and no distress Head: Normocephalic, without obvious abnormality, atraumatic, linear erythema with evidence of excoriation behind both ears.  Resp: clear to auscultation bilaterally Breasts:  R breast: Blanching erythema receding from drawn borders. No dimpling. Skin peeling evident. Slight tenderness to palpation without  fluctuance remains.  Cardio: regular rate and rhythm, S1, S2 normal, no murmur, click, rub or gallop  Disposition: 01-Home or Self Care  Discharge Orders    Future Appointments: Provider: Department: Dept Phone: Center:   01/05/2012 10:15 AM Windell Hummingbird [facility] [phone] None   01/05/2012 10:45 AM Amada Kingfisher, PA [facility] [phone] None     Medication List  As of 10/11/2011 11:03 AM   TAKE these medications         ALPRAZolam 1 MG tablet   Commonly known as: XANAX   TAKE ONE TO TWO TABS EVERY SIX TO EIGHT HOURS PRN ANXIETY      CALCIUM + D PO   Take 600 mg by mouth daily.      cephALEXin 500 MG capsule   Commonly known as: KEFLEX   Take 1 capsule (500 mg total) by mouth every 6 (six) hours.      hydrochlorothiazide 25 MG tablet   Commonly known as: HYDRODIURIL   TAKE 1 TABLET BY MOUTH EVERY DAY      letrozole 2.5 MG tablet   Commonly known as: FEMARA   TAKE 1 TABLET BY MOUTH EVERY DAY      oxyCODONE-acetaminophen 5-325 MG per tablet   Commonly known as: PERCOCET   Take 1 tablet by mouth every 4 (four) hours as needed for pain.      sulfamethoxazole-trimethoprim 800-160 MG per tablet   Commonly known as: BACTRIM DS   Take 2 tablets by mouth every 12 (twelve) hours.            Follow-up Issues and Recommendations:  1. Consider imaging of R breast as the team was unable to  obtain an R breast MRI or ultrasound during the hospital stay.  2. Consider supplying the patient with a prophylactic dose of Keflex to take at the onset of symptoms in the future.   SignedDessa Phi 10/11/2011, 11:06 AM

## 2011-10-11 NOTE — Discharge Summary (Signed)
Madison West MRN: [id number] DOB/AGE: May 13, 1956 56 y.o.  Admit date: 10/04/2011 Discharge date: 10/11/2011  Admission Diagnoses: R breast cellulitis   Discharge Diagnoses:  Cellulitis of breast Breast cancer S/P breast lumpectomy and axillary lymph node ectomy  Hypertension Anxiety Depression   Discharged Condition: fair  Hospital Course: 56 yo F presented with acute onset of redness adn swelling in her R breast concerning for recurrent cellulitis.   1. R breast cellulitis:  -Consulted ID given recurrent nature of problem. Given the quick onset of symptoms the thought is that the causative organism is likely strep. The patient was treated with Zosyn, Vancomycin and Ancef x 4 days. On hospital day #3 she was transiionted to Ancef only. She developed worsening pain and cellulitis and had to be restarted on Vancomycin, although she remained afebrile and her WBC was within normal limits. On hospital day # 6 she was transitioned to oral Kefex and Bactrim to complete a 3 week course per ID recommendations.   Consults: ID (Dr. Paulette Blanch Dam)  Significant Diagnostic Studies:  WBC 12 to 6.5 at discharge Blood cultures: no growth x 5 days x 2 sets.  HIV: non reactive Hg A1c: 5.6  Glucose: 100 on BMET 10/10/11  Treatments: antibiotics: Ancef, vancomycin and Zosyn and analgesia: Morphine and Percocet  Discharge Exam: Blood pressure 123/81, pulse 80, temperature 97.8 F (36.6 C), temperature source Oral, resp. rate 18, height 5\' 9"  (1.753 m), weight 185 lb (83.915 kg), SpO2 97.00%. General appearance: alert, cooperative and no distress Head: Normocephalic, without obvious abnormality, atraumatic, linear erythema with evidence of excoriation behind both ears.  Resp: clear to auscultation bilaterally Breasts:  R breast: Blanching erythema receding from drawn borders. No dimpling. Skin peeling evident. Slight tenderness to palpation without fluctuance remains.  Cardio: regular rate  and rhythm, S1, S2 normal, no murmur, click, rub or gallop  Disposition: 01-Home or Self Care  Discharge Orders    Future Appointments: Provider: Department: Dept Phone: Center:   01/05/2012 10:15 AM Windell Hummingbird [facility] [phone] None   Call in one week as needed Amada Kingfisher, PA      01/05/2012 10:45 AM Amada Kingfisher, PA [facility] [phone] None     Medication List  As of 10/11/2011 11:03 AM   TAKE these medications         ALPRAZolam 1 MG tablet   Commonly known as: XANAX   TAKE ONE TO TWO TABS EVERY SIX TO EIGHT HOURS PRN ANXIETY      CALCIUM + D PO   Take 600 mg by mouth daily.      cephALEXin 500 MG capsule   Commonly known as: KEFLEX   Take 1 capsule (500 mg total) by mouth every 6 (six) hours.      hydrochlorothiazide 25 MG tablet   Commonly known as: HYDRODIURIL   TAKE 1 TABLET BY MOUTH EVERY DAY      letrozole 2.5 MG tablet   Commonly known as: FEMARA   TAKE 1 TABLET BY MOUTH EVERY DAY      oxyCODONE-acetaminophen 5-325 MG per tablet   Commonly known as: PERCOCET   Take 1 tablet by mouth every 4 (four) hours as needed for pain.      sulfamethoxazole-trimethoprim 800-160 MG per tablet   Commonly known as: BACTRIM DS   Take 2 tablets by mouth every 12 (twelve) hours.            Follow-up Issues and Recommendations:  1. Consider imaging  of R breast as the team was unable to obtain an R breast MRI or ultrasound during the hospital stay.  2. Consider supplying the patient with a prophylactic dose of Keflex to take at the onset of symptoms in the future.   SignedDessa Phi 10/11/2011, 11:06 AM

## 2011-10-11 NOTE — Discharge Summary (Signed)
I have reviewed this discharge summary and agree.    

## 2011-10-13 ENCOUNTER — Ambulatory Visit: Payer: No Typology Code available for payment source | Admitting: Psychiatry

## 2011-10-13 NOTE — Progress Notes (Signed)
10-13-2011 Patient cancelled today's appointment because she just was d/c'd from hospital and needs to make up tests at school.  Next appointment 10-20-2011

## 2011-10-15 ENCOUNTER — Other Ambulatory Visit: Payer: Self-pay | Admitting: *Deleted

## 2011-10-15 DIAGNOSIS — F419 Anxiety disorder, unspecified: Secondary | ICD-10-CM

## 2011-10-15 DIAGNOSIS — C50119 Malignant neoplasm of central portion of unspecified female breast: Secondary | ICD-10-CM

## 2011-10-15 MED ORDER — ALPRAZOLAM 1 MG PO TABS: ORAL_TABLET | ORAL | Status: DC

## 2011-10-20 ENCOUNTER — Ambulatory Visit: Payer: No Typology Code available for payment source | Admitting: Psychiatry

## 2011-10-27 ENCOUNTER — Ambulatory Visit (INDEPENDENT_AMBULATORY_CARE_PROVIDER_SITE_OTHER): Payer: No Typology Code available for payment source | Admitting: Psychiatry

## 2011-10-27 DIAGNOSIS — F988 Other specified behavioral and emotional disorders with onset usually occurring in childhood and adolescence: Secondary | ICD-10-CM

## 2011-10-27 DIAGNOSIS — F411 Generalized anxiety disorder: Secondary | ICD-10-CM

## 2011-10-27 DIAGNOSIS — Z638 Other specified problems related to primary support group: Secondary | ICD-10-CM

## 2011-10-29 ENCOUNTER — Encounter: Payer: Self-pay | Admitting: Internal Medicine

## 2011-10-29 ENCOUNTER — Ambulatory Visit (INDEPENDENT_AMBULATORY_CARE_PROVIDER_SITE_OTHER): Payer: PRIVATE HEALTH INSURANCE | Admitting: Internal Medicine

## 2011-10-29 VITALS — BP 147/98 | HR 92 | Temp 98.1°F | Wt 191.0 lb

## 2011-10-29 DIAGNOSIS — N61 Mastitis without abscess: Secondary | ICD-10-CM

## 2011-10-29 NOTE — Progress Notes (Signed)
  Subjective:    Patient ID: Madison West, female    DOB: [birth year], 56 y.o.   MRN: [id number]  HPI She comes in for hospital follow up of cellulitis. She had a history of right breast lumpectomy and lymph node removal for breast cancer and has had recurrent issues of cellulitis of her right breast. She comes in now having been hospitalized for the same. She was placed on oral cephalexin and Bactrim which she is now complete her course. She has had no fever or chills. Her cellulitis has resolved. She does relate a recurrent course of infections over the years.   Review of Systems  Constitutional: Negative for fever, chills and unexpected weight change.  Gastrointestinal: Negative for nausea, abdominal pain and diarrhea.  Skin: Negative for pallor, rash and wound.  Neurological: Negative for dizziness and headaches.  Hematological: Negative for adenopathy.       Objective:   Physical Exam  Constitutional: She appears well-developed and well-nourished. No distress.  Cardiovascular: Normal rate, regular rhythm and normal heart sounds.  Exam reveals no gallop and no friction rub.   No murmur heard. Skin:       Right breast with no erythema, no warmth, resolved infection          Assessment & Plan:

## 2011-10-29 NOTE — Assessment & Plan Note (Signed)
She has completed her course of antibiotics and infection has resolved. Unclear why she gets recurrent episodes but likely related to her history. She will return p.r.n. And if a future episode again occurs, she should try Bactrim 2 double strength tablets twice a day.

## 2011-11-03 ENCOUNTER — Ambulatory Visit (INDEPENDENT_AMBULATORY_CARE_PROVIDER_SITE_OTHER): Payer: No Typology Code available for payment source | Admitting: Psychiatry

## 2011-11-03 DIAGNOSIS — Z638 Other specified problems related to primary support group: Secondary | ICD-10-CM

## 2011-11-03 DIAGNOSIS — F411 Generalized anxiety disorder: Secondary | ICD-10-CM

## 2011-11-03 DIAGNOSIS — F988 Other specified behavioral and emotional disorders with onset usually occurring in childhood and adolescence: Secondary | ICD-10-CM

## 2011-11-04 NOTE — Progress Notes (Signed)
11-03-2011  Patient seen for individual psychotherapy.  Patient's symptoms remain unchanged.  Discussed theory of change and psychotherapy with patient who agreed she needed to "do better."  Patient's insurance benefits have run out for this time period.  Will contact her carrier to request additional sessions.

## 2011-11-09 ENCOUNTER — Other Ambulatory Visit: Payer: Self-pay | Admitting: *Deleted

## 2011-11-09 DIAGNOSIS — C50119 Malignant neoplasm of central portion of unspecified female breast: Secondary | ICD-10-CM

## 2011-11-09 DIAGNOSIS — F419 Anxiety disorder, unspecified: Secondary | ICD-10-CM

## 2011-11-09 MED ORDER — ALPRAZOLAM 1 MG PO TABS: ORAL_TABLET | ORAL | Status: DC

## 2011-11-10 ENCOUNTER — Ambulatory Visit (INDEPENDENT_AMBULATORY_CARE_PROVIDER_SITE_OTHER): Payer: PRIVATE HEALTH INSURANCE | Admitting: Family Medicine

## 2011-11-10 ENCOUNTER — Ambulatory Visit: Payer: PRIVATE HEALTH INSURANCE | Admitting: Psychiatry

## 2011-11-10 VITALS — BP 173/97 | HR 102 | Temp 98.8°F | Resp 20 | Ht 69.0 in | Wt 187.0 lb

## 2011-11-10 DIAGNOSIS — M549 Dorsalgia, unspecified: Secondary | ICD-10-CM

## 2011-11-10 DIAGNOSIS — M5137 Other intervertebral disc degeneration, lumbosacral region: Secondary | ICD-10-CM

## 2011-11-10 MED ORDER — OXYCODONE-ACETAMINOPHEN 5-325 MG PO TABS: 1.0000 | ORAL_TABLET | Freq: Three times a day (TID) | ORAL | Status: AC | PRN

## 2011-11-10 NOTE — Progress Notes (Signed)
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  Patient here because of recurrence of LBP, thought to be related to heavy working.  Had this evaluated last January when she was found to be cancer free and there was some problems with L3L4.  She has had to take Percocet periodically.  The pain has been bothering her for over a week.  She had one episode of left leg radiating pain.  O:  Tearful, went to funeral yesterday, failed recent biology course.  Alert Legs: neg pain with SLR, moderate right thigh muscle wasting Full range of motioin.  A: recurrent LBP with signs of chronic radiculopathy  P:   Recommend neuro workup Percocet 5/325

## 2011-11-10 NOTE — Patient Instructions (Signed)
Degenerative Disc Disease Degenerative disc disease is a condition caused by the changes that occur in the cushions of the backbone (spinal discs) as you grow older. Spinal discs are soft and compressible discs located between the bones of the spine (vertebrae). They act like shock absorbers. Degenerative disc disease can affect the wholespine. However, the neck and lower back are most commonly affected. Many changes can occur in the spinal discs with aging, such as:  The spinal discs may dry and shrink.   Small tears may occur in the tough, outer covering of the disc (annulus).   The disc space may become smaller due to loss of water.   Abnormal growths in the bone (spurs) may occur. This can put pressure on the nerve roots exiting the spinal canal, causing pain.   The spinal canal may become narrowed.  CAUSES  Degenerative disc disease is a condition caused by the changes that occur in the spinal discs with aging. The exact cause is not known, but there is a genetic basis for many patients. Degenerative changes can occur due to loss of fluid in the disc. This makes the disc thinner and reduces the space between the backbones. Small cracks can develop in the outer layer of the disc. This can lead to the breakdown of the disc. You are more likely to get degenerative disc disease if you are overweight. Smoking cigarettes and doing heavy work such as weightlifting can also increase your risk of this condition. Degenerative changes can start after a sudden injury. Growth of bone spurs can compress the nerve roots and cause pain.  SYMPTOMS  The symptoms vary from person to person. Some people may have no pain, while others have severe pain. The pain may be so severe that it can limit your activities. The location of the pain depends on the part of your backbone that is affected. You will have neck or arm pain if a disc in the neck area is affected. You will have pain in your back, buttocks, or legs if a  disc in the lower back is affected. The pain becomes worse while bending, reaching up, or with twisting movements. The pain may start gradually and then get worse as time passes. It may also start after a major or minor injury. You may feel numbness or tingling in the arms or legs.  DIAGNOSIS  Your caregiver will ask you about your symptoms and about activities or habits that may cause the pain. He or she may also ask about any injuries, diseases, ortreatments you have had earlier. Your caregiver will examine you to check for the range of movement that is possible in the affected area, to check for strength in your extremities, and to check for sensation in the areas of the arms and legs supplied by different nerve roots. An X-ray of the spine may be taken. Your caregiver may suggest other imaging tests, such as a computerized magnetic scan (MRI), if needed.  TREATMENT  Treatment includes rest, modifying your activities, and applying ice and heat. Your caregiver may prescribe medicines to reduce your pain and may ask you to do some exercises to strengthen your back. In some cases, you may need surgery. You and your caregiver will decide on the treatment that is best for you. HOME CARE INSTRUCTIONS   Follow proper lifting and walking techniques as advised by your caregiver.   Maintain good posture.   Exercise regularly as advised.   Perform relaxation exercises.   Change your sitting,   standing, and sleeping habits as advised. Change positions frequently.   Lose weight as advised.   Stop smoking if you smoke.   Wear supportive footwear.  SEEK MEDICAL CARE IF:  The pain does not go away within 1 to 4 weeks. SEEK IMMEDIATE MEDICAL CARE IF:   The pain is severe.   You notice weakness in your arms, hands, or legs.   You begin to lose control of your bladder or bowel.  MAKE SURE YOU:   Understand these instructions.   Will watch your condition.   Will get help right away if you are not  doing well or get worse.  Document Released: 04/05/2007 Document Revised: 05/28/2011 Document Reviewed: 04/05/2007 [employer] Patient Information 2012 [employer]. 

## 2011-12-01 ENCOUNTER — Ambulatory Visit (INDEPENDENT_AMBULATORY_CARE_PROVIDER_SITE_OTHER): Payer: No Typology Code available for payment source | Admitting: Psychiatry

## 2011-12-01 DIAGNOSIS — F988 Other specified behavioral and emotional disorders with onset usually occurring in childhood and adolescence: Secondary | ICD-10-CM

## 2011-12-01 DIAGNOSIS — F411 Generalized anxiety disorder: Secondary | ICD-10-CM

## 2011-12-01 DIAGNOSIS — Z638 Other specified problems related to primary support group: Secondary | ICD-10-CM

## 2011-12-02 NOTE — Progress Notes (Signed)
12-01-2011  Patient seen for individual psychotherapy.  Symptoms of ADD remain the same as patient continues to refuse meds.  Next appointment 12-15-2011.

## 2011-12-15 ENCOUNTER — Ambulatory Visit: Payer: Self-pay | Admitting: Psychiatry

## 2011-12-17 ENCOUNTER — Encounter: Payer: Self-pay | Admitting: Family Medicine

## 2011-12-17 ENCOUNTER — Ambulatory Visit (INDEPENDENT_AMBULATORY_CARE_PROVIDER_SITE_OTHER): Payer: PRIVATE HEALTH INSURANCE | Admitting: Family Medicine

## 2011-12-17 VITALS — BP 102/81 | HR 77 | Temp 98.4°F | Resp 17 | Ht 69.0 in | Wt 185.0 lb

## 2011-12-17 DIAGNOSIS — G5601 Carpal tunnel syndrome, right upper limb: Secondary | ICD-10-CM

## 2011-12-17 DIAGNOSIS — I10 Essential (primary) hypertension: Secondary | ICD-10-CM

## 2011-12-17 DIAGNOSIS — M545 Low back pain, unspecified: Secondary | ICD-10-CM

## 2011-12-17 DIAGNOSIS — M549 Dorsalgia, unspecified: Secondary | ICD-10-CM

## 2011-12-17 DIAGNOSIS — Z23 Encounter for immunization: Secondary | ICD-10-CM

## 2011-12-17 DIAGNOSIS — Z0000 Encounter for general adult medical examination without abnormal findings: Secondary | ICD-10-CM

## 2011-12-17 DIAGNOSIS — G5600 Carpal tunnel syndrome, unspecified upper limb: Secondary | ICD-10-CM

## 2011-12-17 LAB — CBC WITH DIFFERENTIAL/PLATELET
Basophils Absolute: 0 10*3/uL (ref 0.0–0.1)
Basophils Relative: 1 % (ref 0–1)
Eosinophils Absolute: 0.2 10*3/uL (ref 0.0–0.7)
Eosinophils Relative: 3 % (ref 0–5)
HCT: 40.5 % (ref 36.0–46.0)
Hemoglobin: 14.3 g/dL (ref 12.0–15.0)
Lymphocytes Relative: 35 % (ref 12–46)
Lymphs Abs: 2.2 10*3/uL (ref 0.7–4.0)
MCH: 31.6 pg (ref 26.0–34.0)
MCHC: 35.3 g/dL (ref 30.0–36.0)
MCV: 89.4 fL (ref 78.0–100.0)
Monocytes Absolute: 0.6 10*3/uL (ref 0.1–1.0)
Monocytes Relative: 9 % (ref 3–12)
Neutro Abs: 3.4 10*3/uL (ref 1.7–7.7)
Neutrophils Relative %: 52 % (ref 43–77)
Platelets: 389 10*3/uL (ref 150–400)
RBC: 4.53 MIL/uL (ref 3.87–5.11)
RDW: 13.6 % (ref 11.5–15.5)
WBC: 6.4 10*3/uL (ref 4.0–10.5)

## 2011-12-17 LAB — LIPID PANEL
Cholesterol: 253 mg/dL — ABNORMAL HIGH (ref 0–200)
HDL: 38 mg/dL — ABNORMAL LOW (ref >39)
LDL Cholesterol: 167 mg/dL — ABNORMAL HIGH (ref 0–99)
Total CHOL/HDL Ratio: 6.7 Ratio
Triglycerides: 240 mg/dL — ABNORMAL HIGH (ref <150)
VLDL: 48 mg/dL — ABNORMAL HIGH (ref 0–40)

## 2011-12-17 LAB — POCT UA - MICROSCOPIC ONLY
Bacteria, U Microscopic: NEGATIVE
Casts, Ur, LPF, POC: NEGATIVE
Crystals, Ur, HPF, POC: NEGATIVE
Yeast, UA: NEGATIVE

## 2011-12-17 LAB — COMPREHENSIVE METABOLIC PANEL
ALT: 12 U/L (ref 0–35)
AST: 15 U/L (ref 0–37)
Albumin: 4.3 g/dL (ref 3.5–5.2)
Alkaline Phosphatase: 50 U/L (ref 39–117)
BUN: 16 mg/dL (ref 6–23)
CO2: 28 mEq/L (ref 19–32)
Calcium: 9.3 mg/dL (ref 8.4–10.5)
Chloride: 102 mEq/L (ref 96–112)
Creat: 0.71 mg/dL (ref 0.50–1.10)
Glucose, Bld: 105 mg/dL — ABNORMAL HIGH (ref 70–99)
Potassium: 3.6 mEq/L (ref 3.5–5.3)
Sodium: 139 mEq/L (ref 135–145)
Total Bilirubin: 0.5 mg/dL (ref 0.3–1.2)
Total Protein: 6.5 g/dL (ref 6.0–8.3)

## 2011-12-17 LAB — POCT URINALYSIS DIPSTICK
Bilirubin, UA: NEGATIVE
Glucose, UA: NEGATIVE
Ketones, UA: NEGATIVE
Leukocytes, UA: NEGATIVE
Nitrite, UA: NEGATIVE
Protein, UA: NEGATIVE
Spec Grav, UA: 1.015
Urobilinogen, UA: 0.2
pH, UA: 6

## 2011-12-17 LAB — TSH: TSH: 2.527 u[IU]/mL (ref 0.350–4.500)

## 2011-12-17 MED ORDER — HYDROCHLOROTHIAZIDE 25 MG PO TABS: 25.0000 mg | ORAL_TABLET | Freq: Every day | ORAL | Status: DC

## 2011-12-17 MED ORDER — TETANUS-DIPHTH-ACELL PERTUSSIS 5-2.5-18.5 LF-MCG/0.5 IM SUSP: 0.5000 mL | Freq: Once | INTRAMUSCULAR | Status: DC

## 2011-12-17 NOTE — Progress Notes (Signed)
@[email or web address] @  Patient ID: Madison West MRN: [id number], DOB: May 27, 1956, 56 y.o. Date of Encounter: 12/17/2011, 8:54 AM  Primary Physician: Elvina Sidle, MD  Chief Complaint: Physical (CPE)  HPI: 56 y.o. y/o female with history of noted below here for CPE.  Since last visit: 56 y.o. y/o female  Partial vulvectomy and D&C at [facility] in April for dysplasia (followed by Dr. Chevis Pretty)  Breast cellulitis April (4th recurrence): admitted at [facility]  Chronic back pain in lumbar and left hip areas, particularly after work  Right hand paresthesias worked up by [employer] Neuro with NCV, recommends PT  MMG:  April 2013; followed for breast ca by Dr. Donnie Coffin  Works room service at [employer]  Review of Systems: Consitutional: No fever, chills, fatigue, night sweats, lymphadenopathy, or weight changes. Eyes: No visual changes, eye redness, or discharge. ENT/Mouth: Ears: No otalgia, tinnitus, hearing loss, discharge. Nose: No congestion, rhinorrhea, sinus pain, or epistaxis. Throat: No sore throat, post nasal drip, or teeth pain. Cardiovascular: No CP, palpitations, diaphoresis, DOE, edema, orthopnea, PND. Respiratory: No cough, hemoptysis, SOB, or wheezing. Gastrointestinal: No anorexia, dysphagia, reflux, pain, nausea, vomiting, hematemesis, diarrhea, constipation, BRBPR, or melena. Breast: No discharge, pain, swelling, or mass. Genitourinary: No dysuria, frequency, urgency, hematuria, incontinence, nocturia, amenorrhea, vaginal discharge, pruritis, burning, abnormal bleeding, or pain. 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 Neurological: No headache, dizziness, syncope, seizures, tremors, memory loss, coordination problems, or paresthesias. Psychological: No anxiety, depression, hallucinations, SI/HI. Endocrine: No fatigue, polydipsia,  polyphagia, polyuria, or known diabetes. All other systems were reviewed and are otherwise negative.  Past Medical History  Diagnosis Date  . Anxiety   . Depression   . Hypertension   . Breast cancer 06/02/07    r breast  . S/P breast lumpectomy 07/04/07    R breast  . S/P radiation therapy 2009     Past Surgical History  Procedure Date  . Mastectomy partial / lumpectomy w/ axillary lymphadenectomy     Home Meds:  Prior to Admission medications   Medication Sig Start Date End Date Taking? Authorizing Provider  ALPRAZolam Prudy Feeler) 1 MG tablet TAKE ONE TO TWO TABS EVERY SIX TO EIGHT HOURS PRN ANXIETY 11/09/11  Yes Amada Kingfisher, PA  Calcium Carbonate-Vitamin D (CALCIUM + D PO) Take 600 mg by mouth daily.     Yes Historical Provider, MD  hydrochlorothiazide (HYDRODIURIL) 25 MG tablet TAKE 1 TABLET BY MOUTH EVERY DAY 08/02/11  Yes Amada Kingfisher, PA  letrozole [facility]) 2.5 MG tablet TAKE 1 TABLET BY MOUTH EVERY DAY 08/02/11  Yes Amada Kingfisher, PA  oxyCODONE-acetaminophen (PERCOCET) 10-325 MG per tablet Take 1 tablet by mouth every 4 (four) hours as needed.   Yes Historical Provider, MD    Allergies:  Allergies  Allergen Reactions  . Vicodin (Hydrocodone-Acetaminophen) Anxiety    Extreme anxiety.    History   Social History  . Marital Status: Widowed    Spouse Name: N/A    Number of Children: N/A  . Years of Education: N/A   Occupational History  . Not on file.   Social History Main Topics  . Smoking status: Former Smoker    Quit date: 05/06/1997  . Smokeless tobacco: Never Used  . Alcohol Use: Yes     2 daily  . Drug Use: No  . Sexually Active: Not on file   Other Topics Concern  . Not on file  Social History Narrative  . No narrative on file    Family History  Problem Relation Age of Onset  . Cancer Mother     colon  . Heart disease Father     heart attack  . Cancer Brother     colon, 2 brothers    Physical Exam: Blood pressure  102/81, pulse 77, temperature 98.4 F (36.9 C), temperature source Oral, resp. rate 17, height 5\' 9"  (1.753 m), weight 185 lb (83.915 kg)., Body mass index is 27.32 kg/(m^2). General: Well developed, well nourished, in no acute distress. HEENT: Normocephalic, atraumatic. Conjunctiva pink, sclera non-icteric. Pupils 2 mm constricting to 1 mm, round, regular, and equally reactive to light and accomodation. EOMI. Internal auditory canal clear. TMs with good cone of light and without pathology. Nasal mucosa pink. Nares are without discharge. No sinus tenderness. Oral mucosa pink. Dentition fair. Pharynx without exudate.   Neck: Supple. Trachea midline. No thyromegaly. Full ROM. No lymphadenopathy. Lungs: Clear to auscultation bilaterally without wheezes, rales, or rhonchi. Breathing is of normal effort and unlabored. Cardiovascular: RRR with S1 S2. No murmurs, rubs, or gallops appreciated. Distal pulses 2+ symmetrically. No carotid or abdominal bruits Abdomen: Soft, non-tender, non-distended with normoactive bowel sounds. No hepatosplenomegaly or masses. No rebound/guarding. No CVA tenderness. Without hernias.  Musculoskeletal: Full range of motion and 5/5 strength throughout. Without swelling, atrophy, tenderness, crepitus, or warmth. Extremities without clubbing, cyanosis, or edema. Calves supple.  Prominent right anterior tibial tubercle Skin: Warm and moist without erythema, ecchymosis, wounds, or rash. Neuro: A+Ox3. CN II-XII grossly intact. Moves all extremities spontaneously. Full sensation throughout. Normal gait. DTR 2+ throughout upper and lower extremities. Finger to nose intact. Psych:  Responds to questions appropriately with a normal affect.   Studies: CBC, CMET, Lipid, TSH, Vitamin D all pending.  Results for orders placed in visit on 12/17/11  POCT URINALYSIS DIPSTICK      Component Value Range   Color, UA yellow     Clarity, UA clear     Glucose, UA neg     Bilirubin, UA neg      Ketones, UA neg     Spec Grav, UA 1.015     Blood, UA small     pH, UA 6.0     Protein, UA neg     Urobilinogen, UA 0.2     Nitrite, UA neg     Leukocytes, UA Negative    POCT UA - MICROSCOPIC ONLY      Component Value Range   WBC, Ur, HPF, POC 0-1     RBC, urine, microscopic 0-4     Bacteria, U Microscopic neg     Mucus, UA trace     Epithelial cells, urine per micros 0-4     Crystals, Ur, HPF, POC neg     Casts, Ur, LPF, POC neg     Yeast, UA neg       Assessment/Plan:  56 y.o. y/o female here for CPE.  Her back pain has become a major issue.  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  She failed a biology course which has greatly discouraged her pursuing a career in health field.  In fact, she tabled her plans for summer school.  She still has some [employer] benefits after her husband's death, so she is mulling over what she can do. I am relieved in her  discontinuing cigarettes in light of her breast cancer and vulvar dysplasia.  I am not sure what to offer for her back.  She may benefit from a back brace, though.  -  Signed, Elvina Sidle, MD 12/17/2011 8:54 AM

## 2011-12-18 ENCOUNTER — Encounter: Payer: Self-pay | Admitting: Family Medicine

## 2011-12-18 LAB — VITAMIN D 25 HYDROXY (VIT D DEFICIENCY, FRACTURES): Vit D, 25-Hydroxy: 38 ng/mL (ref 30–89)

## 2011-12-29 ENCOUNTER — Ambulatory Visit: Payer: Self-pay | Admitting: Psychiatry

## 2011-12-29 NOTE — Progress Notes (Signed)
12-29-2011  Patient cancelled appointment late because she has physical therapy appointment for the same time.  Next appointment 01-12-2012.

## 2011-12-31 ENCOUNTER — Other Ambulatory Visit: Payer: Self-pay | Admitting: *Deleted

## 2011-12-31 DIAGNOSIS — F419 Anxiety disorder, unspecified: Secondary | ICD-10-CM

## 2011-12-31 DIAGNOSIS — C50119 Malignant neoplasm of central portion of unspecified female breast: Secondary | ICD-10-CM

## 2011-12-31 MED ORDER — ALPRAZOLAM 1 MG PO TABS: ORAL_TABLET | ORAL | Status: DC

## 2012-01-04 ENCOUNTER — Other Ambulatory Visit: Payer: Self-pay | Admitting: Physician Assistant

## 2012-01-04 DIAGNOSIS — Z9889 Other specified postprocedural states: Secondary | ICD-10-CM

## 2012-01-05 ENCOUNTER — Ambulatory Visit (HOSPITAL_BASED_OUTPATIENT_CLINIC_OR_DEPARTMENT_OTHER): Admitting: Physician Assistant

## 2012-01-05 ENCOUNTER — Encounter: Payer: Self-pay | Admitting: Physician Assistant

## 2012-01-05 ENCOUNTER — Other Ambulatory Visit (HOSPITAL_BASED_OUTPATIENT_CLINIC_OR_DEPARTMENT_OTHER): Admitting: Lab

## 2012-01-05 ENCOUNTER — Telehealth: Payer: Self-pay | Admitting: *Deleted

## 2012-01-05 VITALS — BP 130/90 | HR 87 | Temp 97.8°F | Ht 69.0 in | Wt 187.6 lb

## 2012-01-05 DIAGNOSIS — C50919 Malignant neoplasm of unspecified site of unspecified female breast: Secondary | ICD-10-CM

## 2012-01-05 DIAGNOSIS — Z170 Estrogen receptor positive status [ER+]: Secondary | ICD-10-CM

## 2012-01-05 DIAGNOSIS — Z9889 Other specified postprocedural states: Secondary | ICD-10-CM

## 2012-01-05 DIAGNOSIS — C519 Malignant neoplasm of vulva, unspecified: Secondary | ICD-10-CM

## 2012-01-05 DIAGNOSIS — N61 Mastitis without abscess: Secondary | ICD-10-CM

## 2012-01-05 LAB — COMPREHENSIVE METABOLIC PANEL
ALT: 11 U/L (ref 0–35)
AST: 16 U/L (ref 0–37)
Albumin: 4.4 g/dL (ref 3.5–5.2)
Alkaline Phosphatase: 49 U/L (ref 39–117)
BUN: 11 mg/dL (ref 6–23)
CO2: 27 mEq/L (ref 19–32)
Calcium: 9.4 mg/dL (ref 8.4–10.5)
Chloride: 101 mEq/L (ref 96–112)
Creatinine, Ser: 0.7 mg/dL (ref 0.50–1.10)
Glucose, Bld: 117 mg/dL — ABNORMAL HIGH (ref 70–99)
Potassium: 3.8 mEq/L (ref 3.5–5.3)
Sodium: 139 mEq/L (ref 135–145)
Total Bilirubin: 0.4 mg/dL (ref 0.3–1.2)
Total Protein: 6.8 g/dL (ref 6.0–8.3)

## 2012-01-05 LAB — CBC WITH DIFFERENTIAL/PLATELET
BASO%: 0.4 % (ref 0.0–2.0)
Basophils Absolute: 0 10*3/uL (ref 0.0–0.1)
EOS%: 1.7 % (ref 0.0–7.0)
Eosinophils Absolute: 0.2 10*3/uL (ref 0.0–0.5)
HCT: 42.2 % (ref 34.8–46.6)
HGB: 14.4 g/dL (ref 11.6–15.9)
LYMPH%: 20.7 % (ref 14.0–49.7)
MCH: 31.4 pg (ref 25.1–34.0)
MCHC: 34 g/dL (ref 31.5–36.0)
MCV: 92.1 fL (ref 79.5–101.0)
MONO#: 0.6 10*3/uL (ref 0.1–0.9)
MONO%: 5.5 % (ref 0.0–14.0)
NEUT#: 7.6 10*3/uL — ABNORMAL HIGH (ref 1.5–6.5)
NEUT%: 71.7 % (ref 38.4–76.8)
Platelets: 387 10*3/uL (ref 145–400)
RBC: 4.59 10*6/uL (ref 3.70–5.45)
RDW: 13.6 % (ref 11.2–14.5)
WBC: 10.5 10*3/uL — ABNORMAL HIGH (ref 3.9–10.3)
lymph#: 2.2 10*3/uL (ref 0.9–3.3)

## 2012-01-05 LAB — CANCER ANTIGEN 27.29: CA 27.29: 21 U/mL (ref 0–39)

## 2012-01-05 NOTE — Telephone Encounter (Signed)
Made patient appointment for 05-23-2012 at 1:00pm at the [facility]

## 2012-01-06 NOTE — Progress Notes (Signed)
Hematology and Oncology Follow Up Visit  Madison West [id number] 1955/10/02 56 y.o. 01/05/12    HPI: Madison West is a 57 year old [place] woman with a history of a node positive, ER PR positive right breast carcinoma S/P right lumpectomy with sentinel node dissection followed by 4 cycles of q. 3 week Taxotere/Cytoxan in the adjuvant setting, radiation under the care Dr. Kathrynn Running completed August 2009 on Femara 2.5 mg p.o. q. day since.  Interim History:   Madison West returns today for follow pertained to her history of a multi-no positive, ER/PR positive right breast carcinoma. She continues on Femara 2.5 mg by mouth daily. Overall she feels well physically except that she is having back pain which is a new symptom. She knows she continues to do quite a bit of stress and is followed by Dr. Enzo Bi on a regular basis. She has had difficulty with recurrent episodes of right breast cellulitis, most recently in April 2013, at which time ID was consulted, questioning whether she has MRSA.  Currently she is having no issues with the breast, ans is due for her annual mammogram in 05/2012.  A detailed review of systems is otherwise noncontributory as noted below.  Review of Systems: Constitutional:  no weight loss, fever, night sweats but admits that she is under quite abit of stress. Eyes: No complaints ENT:No complaints Cardiovascular: no chest pain or dyspnea on exertion Respiratory: no cough, shortness of breath, or wheezing Neurological: no TIA or stroke symptoms Dermatological: negative Gastrointestinal: no abdominal pain, change in bowel habits, or black or bloody stools Genito-Urinary: positive for - vulvar/vaginal symptoms, work up in progress for squamous call ca. of vulvar region Hematological and Lymphatic: negative Breast: negative for breast lumps Musculoskeletal: positive for - pain in back - lower Remaining ROS negative.   Medications:   I have reviewed the  patient's current medications.  Current Outpatient Prescriptions  Medication Sig Dispense Refill  . ALPRAZolam (XANAX) 1 MG tablet TAKE ONE TO TWO TABS EVERY SIX TO EIGHT HOURS PRN ANXIETY  90 tablet  0  . Calcium Carbonate-Vitamin D (CALCIUM + D PO) Take 600 mg by mouth daily.        . hydrochlorothiazide (HYDRODIURIL) 25 MG tablet Take 1 tablet (25 mg total) by mouth daily.  90 tablet  3  . letrozole (FEMARA) 2.5 MG tablet TAKE 1 TABLET BY MOUTH EVERY DAY  30 tablet  11  . oxyCODONE-acetaminophen (PERCOCET) 10-325 MG per tablet Take 1 tablet by mouth every 4 (four) hours as needed.       Current Facility-Administered Medications  Medication Dose Route Frequency Provider Last Rate Last Dose  . TDaP (BOOSTRIX) injection 0.5 mL  0.5 mL Intramuscular Once Elvina Sidle, MD        Allergies:  Allergies  Allergen Reactions  . Vicodin (Hydrocodone-Acetaminophen) Anxiety    Extreme anxiety.    Physical Exam: Filed Vitals:   01/05/12 1112  BP: 130/90  Pulse: 87  Temp: 97.8 F (36.6 C)  Weight: 187 lbs. HEENT:  Sclerae anicteric, conjunctivae pink.  Oropharynx clear.  No mucositis or candidiasis.   Nodes:  No cervical, supraclavicular, or axillary lymphadenopathy palpated.  Breast Exam: The right breast was examined, prior lumpectomy scar has some expected thickening but is otherwise benign, remaining breast tissue is free of any palpable masses. No frank nipple inversion or discharge appreciated no axillary fullness or lymphadenopathy.  The left breast was examined, it is free of any nipple inversion discharge no axillary fullness  or lymphadenopathy no dominant mass effect. Lungs:  Clear to auscultation bilaterally.  No crackles, rhonchi, or wheezes. Patient denies a vertebral tenderness in the upper thoracic spine region, but she does have some thickening noted about the lower aspect of the thoracic spine. No bleeding or bruising noted.  Heart:  Regular rate and rhythm.   Abdomen:   Soft, nontender.  Positive bowel sounds.  No organomegaly or masses palpated.   Musculoskeletal:  No focal spinal tenderness to palpation.  Extremities:  Benign.  No peripheral edema or cyanosis.   Skin:  Benign.   Neuro:  Nonfocal, alert and oriented x 3   Lab Results: Lab Results  Component Value Date   WBC 10.5* 01/05/2012   HGB 14.4 01/05/2012   HCT 42.2 01/05/2012   MCV 92.1 01/05/2012   PLT 387 01/05/2012   NEUTROABS 7.6* 01/05/2012     Chemistry      Component Value Date/Time   NA 139 01/05/2012 1052   K 3.8 01/05/2012 1052   CL 101 01/05/2012 1052   CO2 27 01/05/2012 1052   BUN 11 01/05/2012 1052   CREATININE 0.70 01/05/2012 1052   CREATININE 0.71 12/17/2011 0949      Component Value Date/Time   CALCIUM 9.4 01/05/2012 1052   ALKPHOS 49 01/05/2012 1052   AST 16 01/05/2012 1052   ALT 11 01/05/2012 1052   BILITOT 0.4 01/05/2012 1052       Assessment:  Madison West is a 56 year-old [place] woman with a history of stage IIB ER PR positive right breast carcinoma S/P right lumpectomy with axillary node dissection followed by 4 cycles of q. 3-week TC in the adjuvant setting followed by radiation completed August 2009 on Femara 2.5 mg p.o. q. day since.  2.Squamous cell carcinoma of the vulva S/P resection by Dr. Nelly Laurence.    3. Recurrent cellulitis on right breast, ID has been consulted.  Case reviewed with Dr. Pierce Crane.   Plan:  Per patient request I have refilled her Xanax and Percocet.  She will continue on Femara 2.5 mg by mouth daily.  She will be scheduled for her annual diagnostic mammogram at [facility] for 05/2012.  We will plan followup at a 6 month interval but sooner if the need should arise.  This plan was reviewed with the patient, who voices understanding and agreement.  She knows to call with any changes or problems.    Madison West T, PA-C 01/05/12

## 2012-01-12 ENCOUNTER — Ambulatory Visit (INDEPENDENT_AMBULATORY_CARE_PROVIDER_SITE_OTHER): Payer: No Typology Code available for payment source | Admitting: Psychiatry

## 2012-01-12 DIAGNOSIS — F411 Generalized anxiety disorder: Secondary | ICD-10-CM

## 2012-01-12 DIAGNOSIS — F988 Other specified behavioral and emotional disorders with onset usually occurring in childhood and adolescence: Secondary | ICD-10-CM

## 2012-01-12 DIAGNOSIS — Z638 Other specified problems related to primary support group: Secondary | ICD-10-CM

## 2012-01-26 ENCOUNTER — Ambulatory Visit: Payer: Self-pay | Admitting: Psychiatry

## 2012-01-26 NOTE — Progress Notes (Signed)
01-26-2012  Patient cancelled appointment late.  Next appointment 02-02-2012.

## 2012-02-02 ENCOUNTER — Ambulatory Visit (INDEPENDENT_AMBULATORY_CARE_PROVIDER_SITE_OTHER): Payer: No Typology Code available for payment source | Admitting: Psychiatry

## 2012-02-02 DIAGNOSIS — F988 Other specified behavioral and emotional disorders with onset usually occurring in childhood and adolescence: Secondary | ICD-10-CM

## 2012-02-02 DIAGNOSIS — Z638 Other specified problems related to primary support group: Secondary | ICD-10-CM

## 2012-02-02 DIAGNOSIS — F411 Generalized anxiety disorder: Secondary | ICD-10-CM

## 2012-02-02 NOTE — Progress Notes (Signed)
02-02-2012  Patient seen for individual psychotherapy.  Next appointment 02-16-2012.

## 2012-02-09 ENCOUNTER — Ambulatory Visit: Payer: Self-pay | Admitting: Psychiatry

## 2012-02-16 ENCOUNTER — Ambulatory Visit (INDEPENDENT_AMBULATORY_CARE_PROVIDER_SITE_OTHER): Payer: No Typology Code available for payment source | Admitting: Psychiatry

## 2012-02-16 DIAGNOSIS — F411 Generalized anxiety disorder: Secondary | ICD-10-CM

## 2012-02-16 DIAGNOSIS — F988 Other specified behavioral and emotional disorders with onset usually occurring in childhood and adolescence: Secondary | ICD-10-CM

## 2012-02-16 DIAGNOSIS — Z638 Other specified problems related to primary support group: Secondary | ICD-10-CM

## 2012-02-17 NOTE — Progress Notes (Signed)
02-16-2012  Patient seen for individual psychotherapy.  Next appointment 03-01-2012.

## 2012-03-01 ENCOUNTER — Ambulatory Visit (INDEPENDENT_AMBULATORY_CARE_PROVIDER_SITE_OTHER): Payer: No Typology Code available for payment source | Admitting: Psychiatry

## 2012-03-01 DIAGNOSIS — F411 Generalized anxiety disorder: Secondary | ICD-10-CM

## 2012-03-01 DIAGNOSIS — Z638 Other specified problems related to primary support group: Secondary | ICD-10-CM

## 2012-03-01 DIAGNOSIS — F988 Other specified behavioral and emotional disorders with onset usually occurring in childhood and adolescence: Secondary | ICD-10-CM

## 2012-03-02 NOTE — Progress Notes (Signed)
03-02-2012 Patient seen for individual psychotherapy.  Next appointment 03-15-2012.

## 2012-03-15 ENCOUNTER — Ambulatory Visit: Payer: Self-pay | Admitting: Psychiatry

## 2012-03-20 ENCOUNTER — Emergency Department (HOSPITAL_BASED_OUTPATIENT_CLINIC_OR_DEPARTMENT_OTHER): Payer: Worker's Compensation

## 2012-03-20 ENCOUNTER — Encounter (HOSPITAL_BASED_OUTPATIENT_CLINIC_OR_DEPARTMENT_OTHER): Payer: Self-pay | Admitting: *Deleted

## 2012-03-20 ENCOUNTER — Emergency Department (HOSPITAL_BASED_OUTPATIENT_CLINIC_OR_DEPARTMENT_OTHER)
Admission: EM | Admit: 2012-03-20 | Discharge: 2012-03-20 | Disposition: A | Discharge status: Home / Self Care | Payer: Worker's Compensation | Attending: Emergency Medicine | Admitting: Emergency Medicine

## 2012-03-20 DIAGNOSIS — F3289 Other specified depressive episodes: Secondary | ICD-10-CM | POA: Insufficient documentation

## 2012-03-20 DIAGNOSIS — W010XXA Fall on same level from slipping, tripping and stumbling without subsequent striking against object, initial encounter: Secondary | ICD-10-CM | POA: Insufficient documentation

## 2012-03-20 DIAGNOSIS — F411 Generalized anxiety disorder: Secondary | ICD-10-CM | POA: Insufficient documentation

## 2012-03-20 DIAGNOSIS — S6990XA Unspecified injury of unspecified wrist, hand and finger(s), initial encounter: Secondary | ICD-10-CM | POA: Insufficient documentation

## 2012-03-20 DIAGNOSIS — Y990 Civilian activity done for income or pay: Secondary | ICD-10-CM | POA: Insufficient documentation

## 2012-03-20 DIAGNOSIS — Y9289 Other specified places as the place of occurrence of the external cause: Secondary | ICD-10-CM | POA: Insufficient documentation

## 2012-03-20 DIAGNOSIS — S52509A Unspecified fracture of the lower end of unspecified radius, initial encounter for closed fracture: Secondary | ICD-10-CM

## 2012-03-20 DIAGNOSIS — S62009A Unspecified fracture of navicular [scaphoid] bone of unspecified wrist, initial encounter for closed fracture: Secondary | ICD-10-CM

## 2012-03-20 DIAGNOSIS — Z853 Personal history of malignant neoplasm of breast: Secondary | ICD-10-CM | POA: Insufficient documentation

## 2012-03-20 DIAGNOSIS — F329 Major depressive disorder, single episode, unspecified: Secondary | ICD-10-CM | POA: Insufficient documentation

## 2012-03-20 DIAGNOSIS — S59909A Unspecified injury of unspecified elbow, initial encounter: Secondary | ICD-10-CM | POA: Insufficient documentation

## 2012-03-20 DIAGNOSIS — Z87891 Personal history of nicotine dependence: Secondary | ICD-10-CM | POA: Insufficient documentation

## 2012-03-20 DIAGNOSIS — I10 Essential (primary) hypertension: Secondary | ICD-10-CM | POA: Insufficient documentation

## 2012-03-20 MED ORDER — OXYCODONE-ACETAMINOPHEN 5-325 MG PO TABS
2.0000 | ORAL_TABLET | Freq: Once | ORAL | Status: DC
  Filled 2012-03-20: qty 2

## 2012-03-20 MED ORDER — OXYCODONE-ACETAMINOPHEN 5-325 MG PO TABS: 1.0000 | ORAL_TABLET | Freq: Four times a day (QID) | ORAL | Status: DC | PRN

## 2012-03-20 NOTE — ED Notes (Signed)
Pt returned from xray

## 2012-03-20 NOTE — ED Notes (Signed)
Pt states she fell at work and tried to catch herself with her left arm. C/O pain and deformity to left wrist +radial pulse. Moves fingers. Feels touch. Cap refill < 3 sec. Pt states she will not remove her ring. Arm placed in splint. Ice applied.

## 2012-03-20 NOTE — ED Notes (Signed)
Transported to xray 

## 2012-03-20 NOTE — ED Notes (Signed)
Pt states needs urine drug screen. Brought paperwork from work.  Urine will be collected by Onalee Hua, EMT

## 2012-03-20 NOTE — ED Notes (Signed)
Pt refused pain medication. EMD made aware.

## 2012-03-20 NOTE — ED Provider Notes (Signed)
History  This chart was scribed for Tyrice Hewitt Smitty Cords, MD by Shari Heritage. The patient was seen in room MH01/MH01. Patient's care was started at 0031.     CSN: [id number]  Arrival date & time 03/20/12  0014   First MD Initiated Contact with Patient 03/20/12 0031      Chief Complaint  Patient presents with  . Wrist Injury    Patient is a 56 y.o. female presenting with wrist pain. The history is provided by the patient. No language interpreter was used.  Wrist Pain This is a new problem. The current episode started 1 to 2 hours ago. The problem occurs constantly. The problem has not changed since onset.Pertinent negatives include no chest pain. Exacerbated by: movement at the wrist. Nothing relieves the symptoms. She has tried a cold compress (ice at triage) for the symptoms. The treatment provided no relief.     HPI Comments Madison West is a 56 y.o. female who presents to the Emergency Department complaining of moderate, constant, unchanged left wrist pain onset 1 hour ago. Patient states that she slipped on a wet floor at work and fell on her left arm. Patient denies numbness, weakness, or any other pain. She has a history of HTN.      Past Medical History  Diagnosis Date  . Anxiety   . Depression   . Hypertension   . Breast cancer 06/02/07    r breast  . S/P breast lumpectomy 07/04/07    R breast  . S/P radiation therapy 2009    Past Surgical History  Procedure Date  . Mastectomy partial / lumpectomy w/ axillary lymphadenectomy     Family History  Problem Relation Age of Onset  . Cancer Mother     colon  . Heart disease Father     heart attack  . Cancer Brother     colon, 2 brothers    History  Substance Use Topics  . Smoking status: Former Smoker    Quit date: 05/06/1997  . Smokeless tobacco: Never Used  . Alcohol Use: Yes     2 daily    OB History    Grav Para Term Preterm Abortions TAB SAB Ect Mult Living                  Review of  Systems  Cardiovascular: Negative for chest pain.  Musculoskeletal: Positive for arthralgias.  Neurological: Negative for weakness and numbness.  All other systems reviewed and are negative.    Allergies  Vicodin  Home Medications   Current Outpatient Rx  Name Route Sig Dispense Refill  . ALPRAZOLAM 1 MG PO TABS  TAKE ONE TO TWO TABS EVERY SIX TO EIGHT HOURS PRN ANXIETY 90 tablet 0    PHONED TO PHRMACY  . CALCIUM + D PO Oral Take 600 mg by mouth daily.      Marland Kitchen HYDROCHLOROTHIAZIDE 25 MG PO TABS Oral Take 1 tablet (25 mg total) by mouth daily. 90 tablet 3  . LETROZOLE 2.5 MG PO TABS  TAKE 1 TABLET BY MOUTH EVERY DAY 30 tablet 11  . OXYCODONE-ACETAMINOPHEN 10-325 MG PO TABS Oral Take 1 tablet by mouth every 4 (four) hours as needed.      Pulse 102  Temp 99.4 F (37.4 C) (Oral)  Resp 20  Ht 5\' 9"  (1.753 m)  Wt 185 lb (83.915 kg)  BMI 27.32 kg/m2  SpO2 98%  Physical Exam  Constitutional: She is oriented to person, place, and time. She  appears well-developed and well-nourished.  HENT:  Head: Normocephalic and atraumatic.  Mouth/Throat: Oropharynx is clear and moist. No oropharyngeal exudate.  Eyes: Conjunctivae normal and EOM are normal. Pupils are equal, round, and reactive to light.  Neck: Normal range of motion.  Cardiovascular: Normal rate, regular rhythm and normal heart sounds.   No murmur heard. Pulmonary/Chest: Effort normal and breath sounds normal. No respiratory distress. She has no wheezes. She has no rales.  Abdominal: Soft. Bowel sounds are normal. She exhibits no distension. There is no tenderness. There is no rebound and no guarding.  Musculoskeletal: Normal range of motion.       Left upper extremity: FROM of the left hand, neurovascularly intact to all nerve distributions.  Cap refill less than 2 seconds. 2+ radial pulse. Positive for snuff box tenderness to the left wrist.  Neurological: She is alert and oriented to person, place, and time.  Skin: Skin is  warm and dry.  Psychiatric: She has a normal mood and affect. Her behavior is normal.    ED Course  Procedures (including critical care time) DIAGNOSTIC STUDIES: Oxygen Saturation is 98% on room air, normal by my interpretation.    COORDINATION OF CARE: 12:35am- Patient informed of current plan for treatment and evaluation and agrees with plan at this time.      Labs Reviewed - No data to display No results found.   No diagnosis found.    MDM  Suspect associated scaphoid injury will splint.  Case d/w Dr. Mina Marble.  Call office Monday am to be seen Tuesday.  Patient verbalize understanding and agrees to follow up     I personally performed the services described in this documentation, which was scribed in my presence. The recorded information has been reviewed and considered.    Jasmine Awe, MD 03/20/12 [phone]

## 2012-03-20 NOTE — ED Notes (Signed)
EMD at bedside.

## 2012-03-20 NOTE — ED Notes (Signed)
MD at bedside. 

## 2012-03-24 ENCOUNTER — Encounter: Payer: Self-pay | Admitting: Family Medicine

## 2012-03-24 ENCOUNTER — Ambulatory Visit (INDEPENDENT_AMBULATORY_CARE_PROVIDER_SITE_OTHER): Payer: PRIVATE HEALTH INSURANCE | Admitting: Family Medicine

## 2012-03-24 VITALS — BP 158/90 | HR 95 | Temp 98.9°F | Resp 18 | Ht 69.0 in | Wt 194.0 lb

## 2012-03-24 DIAGNOSIS — F419 Anxiety disorder, unspecified: Secondary | ICD-10-CM

## 2012-03-24 DIAGNOSIS — C50119 Malignant neoplasm of central portion of unspecified female breast: Secondary | ICD-10-CM

## 2012-03-24 DIAGNOSIS — F411 Generalized anxiety disorder: Secondary | ICD-10-CM

## 2012-03-24 DIAGNOSIS — I10 Essential (primary) hypertension: Secondary | ICD-10-CM

## 2012-03-24 MED ORDER — LOSARTAN POTASSIUM-HCTZ 100-12.5 MG PO TABS: 1.0000 | ORAL_TABLET | Freq: Every day | ORAL | Status: DC

## 2012-03-24 MED ORDER — ALPRAZOLAM 1 MG PO TABS: ORAL_TABLET | ORAL | Status: DC

## 2012-03-24 NOTE — Progress Notes (Signed)
56 yo woman who fell on wet floor Saturday night at [place] where she works.  She fractured her left distal radius and is being cared for by Dr. Mina Marble.  Objective:  NAD, patient animated and talkative.  150/86 left arm sitting. Left arm in a splint and sling Heart:  Rate about 100, regular no murmur Chest:  Clear Ext: no edema  Assessment: hypertension. Anxiety  Plan: 1. Malignant neoplasm of central portion of female breast  ALPRAZolam (XANAX) 1 MG tablet  2. Anxiety  ALPRAZolam (XANAX) 1 MG tablet  3. Hypertension  losartan-hydrochlorothiazide (HYZAAR) 100-12.5 MG per tablet

## 2012-03-24 NOTE — Progress Notes (Signed)
This is a 56 year old woman who comes in today wanting her Xanax refilled. She is a cancer survivor with intermittent cellulitis of the right breast, her husband is a deceased veteran, and she recently fell at work and fractured her left distal radius.  . Patient says she's still getting her Percocet refilled by the cancer Center and she's taking 10 mg 3 times a day. She insists that she is not a drug addict but that she needs the medicine and does not want to taper it with the anxiety she's having now.  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  Objective: Blood pressure recheck was 150/88 in the left arm.  Patient has pressured speech. She continues to talk about all the stress she's having and is concerned that she is about to be fired from her job because she fell on wet floor 5 days ago and fractured her left arm. She says that she's been under regular stress and sites loss of her husband and father as well as her continued educational efforts.  HEENT: Unremarkable Chest: Clear Heart: Regular no murmur Extremities: Left arm in sling and brace  Assessment: This woman has a substance abuse problem and I'm not sure how to go about weaning her from her medications. Not as concerned about the Xanax as I and about the Percocet so I agreed to refill her Xanax. The blood pressure is marginally controlled as well. For the latter, convince patient to try a new blood pressure medicine with losartan and hopes to reduce a little better job keeping her pressure down. 1. Malignant neoplasm of central portion of female breast  ALPRAZolam (XANAX) 1 MG tablet  2. Anxiety  ALPRAZolam (XANAX) 1 MG tablet  3. Hypertension  losartan-hydrochlorothiazide (HYZAAR) 100-12.5 MG per tablet

## 2012-03-29 ENCOUNTER — Ambulatory Visit (INDEPENDENT_AMBULATORY_CARE_PROVIDER_SITE_OTHER): Payer: No Typology Code available for payment source | Admitting: Psychiatry

## 2012-03-29 DIAGNOSIS — F411 Generalized anxiety disorder: Secondary | ICD-10-CM

## 2012-03-29 DIAGNOSIS — Z638 Other specified problems related to primary support group: Secondary | ICD-10-CM

## 2012-03-29 DIAGNOSIS — F988 Other specified behavioral and emotional disorders with onset usually occurring in childhood and adolescence: Secondary | ICD-10-CM

## 2012-04-12 ENCOUNTER — Ambulatory Visit (INDEPENDENT_AMBULATORY_CARE_PROVIDER_SITE_OTHER): Payer: No Typology Code available for payment source | Admitting: Psychiatry

## 2012-04-12 DIAGNOSIS — Z638 Other specified problems related to primary support group: Secondary | ICD-10-CM

## 2012-04-12 DIAGNOSIS — F411 Generalized anxiety disorder: Secondary | ICD-10-CM

## 2012-04-12 DIAGNOSIS — F988 Other specified behavioral and emotional disorders with onset usually occurring in childhood and adolescence: Secondary | ICD-10-CM

## 2012-04-26 ENCOUNTER — Ambulatory Visit (INDEPENDENT_AMBULATORY_CARE_PROVIDER_SITE_OTHER): Payer: No Typology Code available for payment source | Admitting: Psychiatry

## 2012-04-26 DIAGNOSIS — F988 Other specified behavioral and emotional disorders with onset usually occurring in childhood and adolescence: Secondary | ICD-10-CM

## 2012-04-26 DIAGNOSIS — F411 Generalized anxiety disorder: Secondary | ICD-10-CM

## 2012-04-26 DIAGNOSIS — Z638 Other specified problems related to primary support group: Secondary | ICD-10-CM

## 2012-05-10 ENCOUNTER — Ambulatory Visit: Payer: Self-pay | Admitting: Psychiatry

## 2012-05-24 ENCOUNTER — Ambulatory Visit (INDEPENDENT_AMBULATORY_CARE_PROVIDER_SITE_OTHER): Payer: No Typology Code available for payment source | Admitting: Psychiatry

## 2012-05-24 DIAGNOSIS — F411 Generalized anxiety disorder: Secondary | ICD-10-CM

## 2012-05-24 DIAGNOSIS — F988 Other specified behavioral and emotional disorders with onset usually occurring in childhood and adolescence: Secondary | ICD-10-CM

## 2012-05-24 DIAGNOSIS — Z638 Other specified problems related to primary support group: Secondary | ICD-10-CM

## 2012-06-07 ENCOUNTER — Ambulatory Visit (INDEPENDENT_AMBULATORY_CARE_PROVIDER_SITE_OTHER): Payer: No Typology Code available for payment source | Admitting: Psychiatry

## 2012-06-07 DIAGNOSIS — F411 Generalized anxiety disorder: Secondary | ICD-10-CM

## 2012-06-07 DIAGNOSIS — Z638 Other specified problems related to primary support group: Secondary | ICD-10-CM

## 2012-06-07 DIAGNOSIS — F988 Other specified behavioral and emotional disorders with onset usually occurring in childhood and adolescence: Secondary | ICD-10-CM

## 2012-06-28 ENCOUNTER — Ambulatory Visit: Payer: Self-pay | Admitting: Oncology

## 2012-06-28 ENCOUNTER — Other Ambulatory Visit: Payer: Self-pay | Admitting: Lab

## 2012-07-04 ENCOUNTER — Other Ambulatory Visit: Payer: Self-pay | Admitting: *Deleted

## 2012-07-04 ENCOUNTER — Telehealth: Payer: Self-pay | Admitting: Oncology

## 2012-07-04 DIAGNOSIS — C50919 Malignant neoplasm of unspecified site of unspecified female breast: Secondary | ICD-10-CM

## 2012-07-04 NOTE — Telephone Encounter (Signed)
I called the patient

## 2012-07-05 ENCOUNTER — Ambulatory Visit (INDEPENDENT_AMBULATORY_CARE_PROVIDER_SITE_OTHER): Payer: PRIVATE HEALTH INSURANCE | Admitting: Psychiatry

## 2012-07-05 DIAGNOSIS — Z638 Other specified problems related to primary support group: Secondary | ICD-10-CM

## 2012-07-05 DIAGNOSIS — F411 Generalized anxiety disorder: Secondary | ICD-10-CM

## 2012-07-05 DIAGNOSIS — F988 Other specified behavioral and emotional disorders with onset usually occurring in childhood and adolescence: Secondary | ICD-10-CM

## 2012-07-11 ENCOUNTER — Telehealth: Payer: Self-pay | Admitting: Oncology

## 2012-07-11 ENCOUNTER — Encounter: Payer: Self-pay | Admitting: Gynecologic Oncology

## 2012-07-11 ENCOUNTER — Ambulatory Visit (HOSPITAL_BASED_OUTPATIENT_CLINIC_OR_DEPARTMENT_OTHER): Payer: PRIVATE HEALTH INSURANCE | Admitting: Gynecologic Oncology

## 2012-07-11 ENCOUNTER — Other Ambulatory Visit (HOSPITAL_BASED_OUTPATIENT_CLINIC_OR_DEPARTMENT_OTHER): Payer: PRIVATE HEALTH INSURANCE | Admitting: Lab

## 2012-07-11 VITALS — BP 118/81 | HR 102 | Temp 98.6°F | Resp 20 | Ht 69.0 in | Wt 199.7 lb

## 2012-07-11 DIAGNOSIS — C50919 Malignant neoplasm of unspecified site of unspecified female breast: Secondary | ICD-10-CM

## 2012-07-11 DIAGNOSIS — Z170 Estrogen receptor positive status [ER+]: Secondary | ICD-10-CM

## 2012-07-11 DIAGNOSIS — C50519 Malignant neoplasm of lower-outer quadrant of unspecified female breast: Secondary | ICD-10-CM

## 2012-07-11 LAB — CBC WITH DIFFERENTIAL/PLATELET
Eosinophils Absolute: 0.1 10*3/uL (ref 0.0–0.5)
HGB: 14.8 g/dL (ref 11.6–15.9)
LYMPH%: 22.8 % (ref 14.0–49.7)
MONO#: 0.5 10*3/uL (ref 0.1–0.9)
NEUT#: 6 10*3/uL (ref 1.5–6.5)
Platelets: 358 10*3/uL (ref 145–400)
RBC: 4.57 10*6/uL (ref 3.70–5.45)
WBC: 8.6 10*3/uL (ref 3.9–10.3)

## 2012-07-11 LAB — COMPREHENSIVE METABOLIC PANEL (CC13)
Albumin: 3.7 g/dL (ref 3.5–5.0)
CO2: 26 mEq/L (ref 22–29)
Glucose: 96 mg/dl (ref 70–99)
Potassium: 3.8 mEq/L (ref 3.5–5.1)
Sodium: 137 mEq/L (ref 136–145)
Total Bilirubin: 0.49 mg/dL (ref 0.20–1.20)
Total Protein: 7.1 g/dL (ref 6.4–8.3)

## 2012-07-11 NOTE — Progress Notes (Signed)
OFFICE PROGRESS NOTE  Madison Sidle, MD [address] [place] [address]  DIAGNOSIS:  Stage IIB, node positive, ER PR positive right breast carcinoma    PRIOR THERAPY: Right lumpectomy with sentinel node dissection followed by 4 cycles of q. 3 week Taxotere/Cytoxan in the adjuvant setting, radiation under the care Dr. Kathrynn Running completed August 2009 on Femara 2.5 mg daily since that time.   CURRENT THERAPY:  Femara 2.5 mg daily   INTERVAL HISTORY: Madison West 57 y.o. female returns for continued follow up for right breast cancer.  She last saw Dr. Donnie Coffin in July of 2013.  She reports that she has had a mammogram in 05/2012 with normal results.  She reports one to two hot flashes a day that are tolerable.  She reports that she has underwent two surgeries for treatment of squamous cell carcinoma of the vulva with no complaints of bleeding or discharge.  She sees Madison West in the near future for follow up and close surveillance from a gynecologic standpoint.  She reports continued complaints of back pain and left wrist pain from recent fracture.  Anxiety remains but improved from last visit.  She is able to work at [employer] with restrictions on lifting and use of the left hand.  She states that management staff has been working with her to adjust daily requirements at work.  She reports that she takes Percocet 10/325 once or twice daily for back, hip, and left wrist pain.  She reports that her right breast has "been feeling really heavy" and she fears that she may be getting cellulitis again.  She denies any areas of redness on the right breast.  Her last bone density scan was in December 2012 with normal results.   MEDICAL HISTORY: Past Medical History  Diagnosis Date  . Anxiety   . Depression   . Hypertension   . Breast cancer 06/02/07    r breast  . S/P breast lumpectomy 07/04/07    R breast  . S/P radiation therapy 2009    ALLERGIES:  is allergic to  vicodin.  MEDICATIONS:  Current Outpatient Prescriptions  Medication Sig Dispense Refill  . ALPRAZolam (XANAX) 1 MG tablet TAKE ONE TO TWO TABS EVERY SIX TO EIGHT HOURS PRN ANXIETY  100 tablet  5  . Calcium Carbonate-Vitamin D (CALCIUM + D PO) Take 600 mg by mouth daily.        Marland Kitchen letrozole (FEMARA) 2.5 MG tablet TAKE 1 TABLET BY MOUTH EVERY DAY  30 tablet  11  . losartan-hydrochlorothiazide (HYZAAR) 100-12.5 MG per tablet Take 1 tablet by mouth daily.  90 tablet  3  . oxyCODONE-acetaminophen (PERCOCET) 5-325 MG per tablet Take 1 tablet by mouth every 6 (six) hours as needed for pain.  10 tablet  0  . oxyCODONE-acetaminophen (PERCOCET) 10-325 MG per tablet Take 1 tablet by mouth every 4 (four) hours as needed.       Current Facility-Administered Medications  Medication Dose Route Frequency Provider Last Rate Last Dose  . TDaP (BOOSTRIX) injection 0.5 mL  0.5 mL Intramuscular Once Madison Sidle, MD        SURGICAL HISTORY:  Past Surgical History  Procedure Date  . Mastectomy partial / lumpectomy w/ axillary lymphadenectomy     REVIEW OF SYSTEMS:   Constitutional: Feels well but restricted due to limited mobility of the left upper extremity.  Cardiovascular: No chest pain, shortness of breath, or edema.  Pulmonary: No cough or wheeze.  Gastrointestinal: No nausea, vomiting,  or diarrhea. No bright red blood per rectum or change in bowel movement.  Genitourinary: No frequency, urgency, or dysuria. No vaginal bleeding, dryness, or discharge.  Musculoskeletal: Left wrist soreness related to recent fracture.  Chronic back pain. Neurologic: No weakness, numbness, or change in gait.  Psychology: Reporting continued anxiety.  Last seen by Dr. Noe West in 02/2012.    HEALTH MAINTENANCE: Mammogram: 05/2012 Colonoscopy: Arranged through Madison West Density: Last in 05/2011.  Scheduled for 2014 scan Pap Smear: Managed by Madison West  PHYSICAL EXAMINATION: Blood pressure 118/81, pulse  102, temperature 98.6 F (37 C), resp. rate 20, height 5\' 9"  (1.753 m), weight 199 lb 11.2 oz (90.583 kg). Body mass index is 29.49 kg/(m^2). ECOG PERFORMANCE STATUS: 1 - Symptomatic but completely ambulatory General: Well developed, well nourished female in no acute distress. Alert and oriented x 3.  Appears mildly anxious.  Neck: Supple without any enlargements.  Lymph node survey: No cervical, supraclavicular, or inguinal adenopathy  Cardiovascular: Regular rate and rhythm. S1 and S2 normal.  Lungs: Clear to auscultation bilaterally. No wheezes/crackles/rhonchi noted.  Muscul: Left forearm and hand with compression sleeve on.  Mild edema noted in the left hand.  Limited range of motion with the LUE. Skin: No rashes or lesions present. Back: No CVA tenderness  Abdomen: Abdomen soft, non-tender. Active bowel sounds in all quadrants. No evidence of a fluid wave or abdominal masses.  Breast: Right breast with lumpectomy scar examined with mild thickening along the scar, remaining tissue without masses.  No redness or signs of cellulitis noted.  Left breast without masses, discharge, or lymphadenopathy. Extremities: No bilateral cyanosis or clubbing.  Mild edema noted in the left hand from recent fracture.      LABORATORY DATA: Lab Results  Component Value Date   WBC 8.6 07/11/2012   HGB 14.8 07/11/2012   HCT 41.5 07/11/2012   MCV 90.7 07/11/2012   PLT 358 07/11/2012      Chemistry      Component Value Date/Time   NA 137 07/11/2012 1139   NA 139 01/05/2012 1052   K 3.8 07/11/2012 1139   K 3.8 01/05/2012 1052   CL 101 07/11/2012 1139   CL 101 01/05/2012 1052   CO2 26 07/11/2012 1139   CO2 27 01/05/2012 1052   BUN 12.0 07/11/2012 1139   BUN 11 01/05/2012 1052   CREATININE 0.8 07/11/2012 1139   CREATININE 0.70 01/05/2012 1052   CREATININE 0.71 12/17/2011 0949      Component Value Date/Time   CALCIUM 9.9 07/11/2012 1139   CALCIUM 9.4 01/05/2012 1052   ALKPHOS 63 07/11/2012 1139   ALKPHOS 49  01/05/2012 1052   AST 14 07/11/2012 1139   AST 16 01/05/2012 1052   ALT 13 07/11/2012 1139   ALT 11 01/05/2012 1052   BILITOT 0.49 07/11/2012 1139   BILITOT 0.4 01/05/2012 1052      RADIOGRAPHIC STUDIES: No results found.  ASSESSMENT:  #1 Stage IIB ER PR positive right breast carcinoma S/P right lumpectomy with axillary node dissection followed by 4 cycles of q. 3-week TC in the adjuvant setting followed by radiation completed August 2009 on Femara 2.5 mg p.o. q. day since.   #2 Squamous cell carcinoma of the vulva S/P resection by Dr. Nelly West.  Followed by Madison West.   #3 History of recurrent cellulitis on right breast.  #4 Chronic back and hip pain along with new onset of left wrist pain related to a fracture in late fall of 2013.  PLAN:  #1  She is to continue taking letrozole (Femara) 2.5 mg daily.  She is recommended to begin taking Vitamin D3 2000 units daily and to have a follow up bone density scan.  She will follow up with Dr. Welton West in 6 months.  #2  Stable at this time per patient.  Follow up with Madison West.  #3  Referral made to outpatient rehabilitation for cancer rehabilitation to decrease swelling and "heaviness" in right breast.  #4  Percocet 10/325 tablet one or two tablets daily as needed for chronic pain #60, 0 refills to last her until her 6 month follow up appointment ordered per Dr. Welton West.  Non-pharmacologic measures discussed along with the concerns about chronic acetaminophen intake.   All questions were answered. The patient knows to call the clinic with any problems, questions or concerns. We can certainly see the patient much sooner if necessary.  I spent 30 minutes counseling the patient face to face and Dr. Welton West spoke with the patient for 10 minutes. The patient was reviewed with Dr. Welton West and she examined the right breast and spoke with the patient about future plans with becoming her patient.  The total time spent in the appointment including abstracting  information from previous notes and studies includes 2 hours.  Madison Mccreedy, NP Medical/Oncology [facility] [phone] (Office)  07/11/2012, 1:04 PM

## 2012-07-11 NOTE — Patient Instructions (Addendum)
Doing well.  Continue taking Femara as prescribed.  We will schedule you for a bone density scan and for a referral to Cancer rehab to assist with decreasing edema in the right breast.  Take the Percocet once or twice daily only AS NEEDED.  Begin taking Vitamin D3 2000 units once daily.  Please call the office for any questions or concerns.  Plan to follow up in six months or sooner if needed.

## 2012-07-11 NOTE — Telephone Encounter (Signed)
appts made and printed for ptt aom

## 2012-07-12 ENCOUNTER — Telehealth: Payer: Self-pay | Admitting: Oncology

## 2012-07-12 ENCOUNTER — Telehealth: Payer: Self-pay | Admitting: Gynecologic Oncology

## 2012-07-12 ENCOUNTER — Encounter: Payer: Self-pay | Admitting: Gynecologic Oncology

## 2012-07-12 ENCOUNTER — Other Ambulatory Visit: Payer: Self-pay | Admitting: Gynecologic Oncology

## 2012-07-12 DIAGNOSIS — C50919 Malignant neoplasm of unspecified site of unspecified female breast: Secondary | ICD-10-CM

## 2012-07-12 NOTE — Telephone Encounter (Signed)
lmonvm adviisng the pt of her bone density appt which will be on the same day as the mammogram in dec per sherry at the [facility]. Per sherry that is when the pt is due.

## 2012-07-12 NOTE — Telephone Encounter (Signed)
Pt informed of lab results: no concerns voiced.  Instructed to call for any questions or concerns.

## 2012-07-15 ENCOUNTER — Telehealth: Payer: Self-pay | Admitting: Medical Oncology

## 2012-07-15 ENCOUNTER — Telehealth: Payer: Self-pay | Admitting: Oncology

## 2012-07-15 NOTE — Telephone Encounter (Signed)
Spoke with patient regarding bone density scan. There was a question regarding that it has not been two years since pt's last scan and insurance coverage. Patient insists her insurances will cover the scan and wishes to proceed with appt as ordered by Dr Welton Flakes. Explained to patient that should her insurances refuse to cover scan, patient will be responsible for bill. Patient states she does not want to wait another year and would like to proceed with scan as ordered by Dr. Welton Flakes. Will forward message to scheduling.

## 2012-07-15 NOTE — Telephone Encounter (Signed)
Per 1/24 pof schedule bone density test at [facility] as ordered per doctor. S/w [facility] and pt is not due to bone density until December 2014. Per Myrene Buddy pt is already on schedule for both her mammo and bone density test. Message via EPIC to KK and desk nurse re above.

## 2012-07-18 ENCOUNTER — Ambulatory Visit: Payer: PRIVATE HEALTH INSURANCE | Attending: Oncology | Admitting: Physical Therapy

## 2012-07-18 DIAGNOSIS — N63 Unspecified lump in unspecified breast: Secondary | ICD-10-CM | POA: Insufficient documentation

## 2012-07-18 DIAGNOSIS — Z853 Personal history of malignant neoplasm of breast: Secondary | ICD-10-CM | POA: Insufficient documentation

## 2012-07-18 DIAGNOSIS — IMO0001 Reserved for inherently not codable concepts without codable children: Secondary | ICD-10-CM | POA: Insufficient documentation

## 2012-07-18 DIAGNOSIS — I890 Lymphedema, not elsewhere classified: Secondary | ICD-10-CM | POA: Insufficient documentation

## 2012-07-19 ENCOUNTER — Ambulatory Visit: Payer: Self-pay | Admitting: Psychiatry

## 2012-07-20 ENCOUNTER — Ambulatory Visit: Payer: PRIVATE HEALTH INSURANCE | Admitting: Physical Therapy

## 2012-07-20 ENCOUNTER — Ambulatory Visit: Payer: No Typology Code available for payment source | Admitting: Psychiatry

## 2012-07-21 ENCOUNTER — Other Ambulatory Visit: Payer: Self-pay | Admitting: *Deleted

## 2012-07-21 ENCOUNTER — Ambulatory Visit (INDEPENDENT_AMBULATORY_CARE_PROVIDER_SITE_OTHER): Payer: PRIVATE HEALTH INSURANCE | Admitting: Family Medicine

## 2012-07-21 ENCOUNTER — Encounter: Payer: Self-pay | Admitting: Family Medicine

## 2012-07-21 VITALS — BP 130/80 | HR 86 | Temp 97.4°F | Resp 16 | Ht 69.0 in | Wt 199.4 lb

## 2012-07-21 DIAGNOSIS — F419 Anxiety disorder, unspecified: Secondary | ICD-10-CM

## 2012-07-21 DIAGNOSIS — G90519 Complex regional pain syndrome I of unspecified upper limb: Secondary | ICD-10-CM

## 2012-07-21 DIAGNOSIS — F411 Generalized anxiety disorder: Secondary | ICD-10-CM

## 2012-07-21 DIAGNOSIS — C50119 Malignant neoplasm of central portion of unspecified female breast: Secondary | ICD-10-CM

## 2012-07-21 DIAGNOSIS — I10 Essential (primary) hypertension: Secondary | ICD-10-CM

## 2012-07-21 MED ORDER — LOSARTAN POTASSIUM-HCTZ 100-12.5 MG PO TABS: 1.0000 | ORAL_TABLET | Freq: Every day | ORAL | Status: DC

## 2012-07-21 MED ORDER — ALPRAZOLAM 1 MG PO TABS: ORAL_TABLET | ORAL | Status: DC

## 2012-07-21 NOTE — Progress Notes (Signed)
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    Her right hand is numb from breast surgery and axillary dissection years ago.  Back is much better with occasional right hip discomfort.  Objective:  NAD Mild left hand swelling diffusely with early Dupuytren's features.  Heart:  Regular, no murmur Chest:  Clear Neck:  Supple, no adenopathy, no bruit  Assessment:  Stable BP, RLS  Plan:  1. Malignant neoplasm of central portion of female breast  ALPRAZolam (XANAX) 1 MG tablet  2. Anxiety  ALPRAZolam (XANAX) 1 MG tablet  3. Hypertension  losartan-hydrochlorothiazide (HYZAAR) 100-12.5 MG per tablet

## 2012-07-26 ENCOUNTER — Ambulatory Visit (INDEPENDENT_AMBULATORY_CARE_PROVIDER_SITE_OTHER): Payer: No Typology Code available for payment source | Admitting: Psychiatry

## 2012-07-26 DIAGNOSIS — F411 Generalized anxiety disorder: Secondary | ICD-10-CM

## 2012-07-26 DIAGNOSIS — F988 Other specified behavioral and emotional disorders with onset usually occurring in childhood and adolescence: Secondary | ICD-10-CM

## 2012-07-26 DIAGNOSIS — Z638 Other specified problems related to primary support group: Secondary | ICD-10-CM

## 2012-08-01 ENCOUNTER — Ambulatory Visit: Payer: PRIVATE HEALTH INSURANCE | Admitting: Physical Therapy

## 2012-08-02 ENCOUNTER — Ambulatory Visit: Payer: Self-pay | Admitting: Psychiatry

## 2012-08-08 ENCOUNTER — Encounter: Payer: Self-pay | Admitting: Physical Therapy

## 2012-08-10 ENCOUNTER — Other Ambulatory Visit: Payer: Self-pay | Admitting: Physician Assistant

## 2012-08-15 ENCOUNTER — Encounter: Payer: Self-pay | Admitting: Physical Therapy

## 2012-08-16 ENCOUNTER — Ambulatory Visit: Payer: Self-pay | Admitting: Psychiatry

## 2012-08-22 ENCOUNTER — Ambulatory Visit: Payer: PRIVATE HEALTH INSURANCE | Admitting: Physical Therapy

## 2012-08-23 ENCOUNTER — Ambulatory Visit: Payer: PRIVATE HEALTH INSURANCE | Admitting: Psychiatry

## 2012-08-29 ENCOUNTER — Encounter: Payer: Self-pay | Admitting: Physical Therapy

## 2012-08-30 ENCOUNTER — Ambulatory Visit (INDEPENDENT_AMBULATORY_CARE_PROVIDER_SITE_OTHER): Payer: No Typology Code available for payment source | Admitting: Psychiatry

## 2012-08-30 DIAGNOSIS — F988 Other specified behavioral and emotional disorders with onset usually occurring in childhood and adolescence: Secondary | ICD-10-CM

## 2012-08-30 DIAGNOSIS — Z638 Other specified problems related to primary support group: Secondary | ICD-10-CM

## 2012-08-30 DIAGNOSIS — F411 Generalized anxiety disorder: Secondary | ICD-10-CM

## 2012-09-13 ENCOUNTER — Ambulatory Visit: Payer: Self-pay | Admitting: Psychiatry

## 2012-09-20 ENCOUNTER — Ambulatory Visit (INDEPENDENT_AMBULATORY_CARE_PROVIDER_SITE_OTHER): Payer: No Typology Code available for payment source | Admitting: Psychiatry

## 2012-09-20 DIAGNOSIS — Z638 Other specified problems related to primary support group: Secondary | ICD-10-CM

## 2012-09-20 DIAGNOSIS — F988 Other specified behavioral and emotional disorders with onset usually occurring in childhood and adolescence: Secondary | ICD-10-CM

## 2012-09-20 DIAGNOSIS — F411 Generalized anxiety disorder: Secondary | ICD-10-CM

## 2012-09-27 ENCOUNTER — Ambulatory Visit: Payer: Self-pay | Admitting: Psychiatry

## 2012-10-04 ENCOUNTER — Ambulatory Visit (INDEPENDENT_AMBULATORY_CARE_PROVIDER_SITE_OTHER): Payer: No Typology Code available for payment source | Admitting: Psychiatry

## 2012-10-04 DIAGNOSIS — Z638 Other specified problems related to primary support group: Secondary | ICD-10-CM

## 2012-10-04 DIAGNOSIS — F988 Other specified behavioral and emotional disorders with onset usually occurring in childhood and adolescence: Secondary | ICD-10-CM

## 2012-10-04 DIAGNOSIS — F411 Generalized anxiety disorder: Secondary | ICD-10-CM

## 2012-10-11 ENCOUNTER — Ambulatory Visit: Payer: Self-pay | Admitting: Psychiatry

## 2012-10-13 ENCOUNTER — Telehealth: Payer: Self-pay | Admitting: Medical Oncology

## 2012-10-13 NOTE — Telephone Encounter (Signed)
Patient called requesting refill of percocet d/t "joint pain really bothering her more than ever." Informed patient per last office visit she rec'vd refill of percocet with 60 pills to last her until her 6 month follow-up appt with MD. Patient states she has been on femara for 5 years now, recommended to patient to stop femara for 2 weeks to see if that helps alleviate the aches and pain, patient stated she won't do that. States she doesn't understand why she wasn't given 100 pills to last her longer. Informed patient I could not answer that for her but will forward this message to MD.  Clovis Cao appt with MD 01/09/13

## 2012-10-19 ENCOUNTER — Ambulatory Visit (INDEPENDENT_AMBULATORY_CARE_PROVIDER_SITE_OTHER): Payer: PRIVATE HEALTH INSURANCE | Admitting: Family Medicine

## 2012-10-19 VITALS — BP 134/88 | HR 101 | Temp 99.6°F | Resp 16 | Ht 69.0 in | Wt 199.0 lb

## 2012-10-19 DIAGNOSIS — M5430 Sciatica, unspecified side: Secondary | ICD-10-CM

## 2012-10-19 DIAGNOSIS — G90519 Complex regional pain syndrome I of unspecified upper limb: Secondary | ICD-10-CM | POA: Insufficient documentation

## 2012-10-19 DIAGNOSIS — G5640 Causalgia of unspecified upper limb: Secondary | ICD-10-CM

## 2012-10-19 DIAGNOSIS — G90512 Complex regional pain syndrome I of left upper limb: Secondary | ICD-10-CM

## 2012-10-19 DIAGNOSIS — M5431 Sciatica, right side: Secondary | ICD-10-CM

## 2012-10-19 DIAGNOSIS — J209 Acute bronchitis, unspecified: Secondary | ICD-10-CM

## 2012-10-19 MED ORDER — BENZONATATE 200 MG PO CAPS: 200.0000 mg | ORAL_CAPSULE | Freq: Two times a day (BID) | ORAL | Status: DC | PRN

## 2012-10-19 MED ORDER — AZITHROMYCIN 250 MG PO TABS: ORAL_TABLET | ORAL | Status: DC

## 2012-10-19 MED ORDER — OXYCODONE-ACETAMINOPHEN 10-325 MG PO TABS: 1.0000 | ORAL_TABLET | ORAL | Status: DC | PRN

## 2012-10-19 NOTE — Progress Notes (Signed)
Patient ID: Madison West MRN: [id number], DOB: 10/27/1955, 57 y.o. Date of Encounter: 10/19/2012, 8:19 PM  Primary Physician: Elvina Sidle, MD  Chief Complaint:  Chief Complaint  Patient presents with  . Cough  . Sore Throat    HPI: 57 y.o. year old female presents with a 2 day history of nasal congestion, post nasal drip, sore throat, and cough. Mild sinus pressure. Afebrile. No chills. Nasal congestion thick and green/yellow. Cough is productive of green/yellow sputum and not associated with time of day. Ears feel full, leading to sensation of muffled hearing. Has tried OTC cold preps without success. No GI complaints.   No sick contacts, recent antibiotics, or recent travels.   No leg trauma, sedentary periods, h/o cancer, or tobacco use.  Past Medical History  Diagnosis Date  . Anxiety   . Depression   . Hypertension   . Breast cancer 06/02/07    r breast  . S/P breast lumpectomy 07/04/07    R breast  . S/P radiation therapy 2009     Home Meds: Prior to Admission medications   Medication Sig Start Date End Date Taking? Authorizing Provider  ALPRAZolam Prudy Feeler) 1 MG tablet TAKE ONE TO TWO TABS EVERY SIX TO EIGHT HOURS PRN ANXIETY 07/21/12  Yes Elvina Sidle, MD  Calcium Carbonate-Vitamin D (CALCIUM + D PO) Take 600 mg by mouth daily.     Yes Historical Provider, MD  letrozole (FEMARA) 2.5 MG tablet TAKE 1 TABLET BY MOUTH EVERY DAY 08/10/12  Yes Victorino December, MD  losartan-hydrochlorothiazide (HYZAAR) 100-12.5 MG per tablet Take 1 tablet by mouth daily. 07/21/12  Yes Elvina Sidle, MD  oxyCODONE-acetaminophen (PERCOCET) 10-325 MG per tablet Take 1 tablet by mouth as needed. To take one tablet once or twice daily as needed for back and hip pain.  #60 given to patient per Dr. Welton Flakes to last the patient 6 months until her next visit.    Historical Provider, MD    Allergies:  Allergies  Allergen Reactions  . Vicodin (Hydrocodone-Acetaminophen) Anxiety    Extreme  anxiety.    History   Social History  . Marital Status: Widowed    Spouse Name: N/A    Number of Children: N/A  . Years of Education: N/A   Occupational History  . Not on file.   Social History Main Topics  . Smoking status: Former Smoker    Quit date: 05/06/1997  . Smokeless tobacco: Never Used  . Alcohol Use: Yes     Comment: 2 daily  . Drug Use: No  . Sexually Active: Not on file   Other Topics Concern  . Not on file   Social History Narrative  . No narrative on file     Review of Systems: Constitutional: negative for chills, fever, night sweats or weight changes Cardiovascular: negative for chest pain or palpitations Respiratory: negative for hemoptysis, wheezing, or shortness of breath Abdominal: negative for abdominal pain, nausea, vomiting or diarrhea Dermatological: negative for rash Neurologic: negative for headache  Patient is getting injections in her neck for a radicular pain syndrome.  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   Patient also has left sciatica occasionally    Physical Exam: Blood pressure 134/88, pulse 101, temperature 99.6 F (37.6 C), temperature source Oral, resp. rate 16, height 5\' 9"  (1.753 m), weight 199 lb (90.266 kg), SpO2 95.00%., Body mass index is 29.37 kg/(m^2). General: Well developed, well nourished, in no acute  distress. Head: Normocephalic, atraumatic, eyes without discharge, sclera non-icteric, nares are congested. Bilateral auditory canals clear, TM's are without perforation, pearly grey with reflective cone of light bilaterally. No sinus TTP. Oral cavity moist, dentition normal. Posterior pharynx with post nasal drip and mild erythema. No peritonsillar abscess or tonsillar exudate. Neck: Supple. No thyromegaly. Full ROM. No lymphadenopathy. Lungs: Coarse breath sounds bilaterally without wheezes, rales, or rhonchi. Breathing is unlabored.  Heart: RRR with S1 S2. No  murmurs, rubs, or gallops appreciated. Msk:  Strength and tone normal for age. Extremities: No clubbing or cyanosis. No edema. Neuro: Alert and oriented X 3. Moves all extremities spontaneously. CNII-XII grossly in tact. Psych:  Responds to questions appropriately with a normal affect.   Labs:   ASSESSMENT AND PLAN:  57 y.o. year old female with bronchitis. - -Mucinex -Tylenol/Motrin prn -Rest/fluids -RTC precautions -RTC 3-5 days if no improvement  Signed, Elvina Sidle, MD 10/19/2012 8:19 PM

## 2012-10-25 ENCOUNTER — Ambulatory Visit (INDEPENDENT_AMBULATORY_CARE_PROVIDER_SITE_OTHER): Payer: No Typology Code available for payment source | Admitting: Psychiatry

## 2012-10-25 DIAGNOSIS — Z638 Other specified problems related to primary support group: Secondary | ICD-10-CM

## 2012-10-25 DIAGNOSIS — F988 Other specified behavioral and emotional disorders with onset usually occurring in childhood and adolescence: Secondary | ICD-10-CM

## 2012-10-25 DIAGNOSIS — F411 Generalized anxiety disorder: Secondary | ICD-10-CM

## 2012-11-02 ENCOUNTER — Telehealth: Payer: Self-pay | Admitting: *Deleted

## 2012-11-02 NOTE — Telephone Encounter (Signed)
Pt called about if we got the ok to switch.  Went to [employer] and she said yes.  Cancelled appt w/ Dr. Welton Flakes.  Confirmed 12/27/12 appt w/ pt for Dr. Darnelle Catalan.  Took paperwork to Med Rec for chart.

## 2012-11-07 ENCOUNTER — Ambulatory Visit: Payer: PRIVATE HEALTH INSURANCE | Admitting: Family Medicine

## 2012-11-08 ENCOUNTER — Ambulatory Visit: Payer: Self-pay | Admitting: Psychiatry

## 2012-11-10 ENCOUNTER — Other Ambulatory Visit: Payer: Self-pay | Admitting: *Deleted

## 2012-11-10 DIAGNOSIS — C50911 Malignant neoplasm of unspecified site of right female breast: Secondary | ICD-10-CM

## 2012-11-15 ENCOUNTER — Ambulatory Visit (INDEPENDENT_AMBULATORY_CARE_PROVIDER_SITE_OTHER): Payer: No Typology Code available for payment source | Admitting: Psychiatry

## 2012-11-15 DIAGNOSIS — F411 Generalized anxiety disorder: Secondary | ICD-10-CM

## 2012-11-15 DIAGNOSIS — F988 Other specified behavioral and emotional disorders with onset usually occurring in childhood and adolescence: Secondary | ICD-10-CM

## 2012-11-22 ENCOUNTER — Ambulatory Visit: Payer: No Typology Code available for payment source | Admitting: Psychiatry

## 2012-12-01 ENCOUNTER — Ambulatory Visit (INDEPENDENT_AMBULATORY_CARE_PROVIDER_SITE_OTHER): Payer: PRIVATE HEALTH INSURANCE | Admitting: Family Medicine

## 2012-12-01 VITALS — BP 115/81 | HR 86 | Temp 99.2°F | Resp 16 | Ht 69.5 in | Wt 198.0 lb

## 2012-12-01 DIAGNOSIS — F419 Anxiety disorder, unspecified: Secondary | ICD-10-CM

## 2012-12-01 DIAGNOSIS — F411 Generalized anxiety disorder: Secondary | ICD-10-CM

## 2012-12-01 DIAGNOSIS — R5381 Other malaise: Secondary | ICD-10-CM

## 2012-12-01 DIAGNOSIS — I10 Essential (primary) hypertension: Secondary | ICD-10-CM

## 2012-12-01 LAB — POCT URINALYSIS DIPSTICK
Bilirubin, UA: NEGATIVE
Glucose, UA: NEGATIVE
Ketones, UA: NEGATIVE
Leukocytes, UA: NEGATIVE
Nitrite, UA: NEGATIVE
Protein, UA: NEGATIVE
Spec Grav, UA: 1.01
Urobilinogen, UA: 0.2
pH, UA: 6

## 2012-12-01 LAB — CBC WITH DIFFERENTIAL/PLATELET
Basophils Absolute: 0 10*3/uL (ref 0.0–0.1)
Basophils Relative: 1 % (ref 0–1)
Eosinophils Absolute: 0.1 10*3/uL (ref 0.0–0.7)
Eosinophils Relative: 2 % (ref 0–5)
HCT: 42.3 % (ref 36.0–46.0)
Hemoglobin: 15 g/dL (ref 12.0–15.0)
Lymphocytes Relative: 29 % (ref 12–46)
Lymphs Abs: 2.1 10*3/uL (ref 0.7–4.0)
MCH: 31.7 pg (ref 26.0–34.0)
MCHC: 35.5 g/dL (ref 30.0–36.0)
MCV: 89.4 fL (ref 78.0–100.0)
Monocytes Absolute: 0.5 10*3/uL (ref 0.1–1.0)
Monocytes Relative: 7 % (ref 3–12)
Neutro Abs: 4.5 10*3/uL (ref 1.7–7.7)
Neutrophils Relative %: 61 % (ref 43–77)
Platelets: 444 10*3/uL — ABNORMAL HIGH (ref 150–400)
RBC: 4.73 MIL/uL (ref 3.87–5.11)
RDW: 13.3 % (ref 11.5–15.5)
WBC: 7.4 10*3/uL (ref 4.0–10.5)

## 2012-12-01 LAB — COMPREHENSIVE METABOLIC PANEL
ALT: 15 U/L (ref 0–35)
AST: 16 U/L (ref 0–37)
Albumin: 4.6 g/dL (ref 3.5–5.2)
Alkaline Phosphatase: 60 U/L (ref 39–117)
BUN: 11 mg/dL (ref 6–23)
CO2: 25 mEq/L (ref 19–32)
Calcium: 9.9 mg/dL (ref 8.4–10.5)
Chloride: 101 mEq/L (ref 96–112)
Creat: 0.69 mg/dL (ref 0.50–1.10)
Glucose, Bld: 98 mg/dL (ref 70–99)
Potassium: 4 mEq/L (ref 3.5–5.3)
Sodium: 135 mEq/L (ref 135–145)
Total Bilirubin: 0.5 mg/dL (ref 0.3–1.2)
Total Protein: 7 g/dL (ref 6.0–8.3)

## 2012-12-01 LAB — VITAMIN B12: Vitamin B-12: 546 pg/mL (ref 211–911)

## 2012-12-01 LAB — POCT UA - MICROSCOPIC ONLY
Bacteria, U Microscopic: NEGATIVE
Casts, Ur, LPF, POC: NEGATIVE
Crystals, Ur, HPF, POC: NEGATIVE
Mucus, UA: NEGATIVE
Yeast, UA: NEGATIVE

## 2012-12-01 NOTE — Progress Notes (Signed)
57 yo with vulvar surgery at [facility] recently, slow recovery from a left wrist fracture(workers comp), and ongoing breast ca treatment (right).  Off school for the summer.  Worried for daughters who are having trouble taking tests for ultrasonography.  Objective:  Anxious Left hand:  Almost complete ROM lacking complete flexion of fingers. Chest: Clear Heart: Regular no murmur, gallop, rub Abdomen: Soft nontender Vulva: Multiple areas of surgical intervention where overlying Dermabond is healing. There is no significant tenderness, swelling, discharge, or erythema  Results for orders placed in visit on 07/11/12  CANCER ANTIGEN 27.29      Result Value Range   CA 27.29 26  0 - 39 U/mL  CBC WITH DIFFERENTIAL      Result Value Range   WBC 8.6  3.9 - 10.3 10e3/uL   NEUT# 6.0  1.5 - 6.5 10e3/uL   HGB 14.8  11.6 - 15.9 g/dL   HCT 16.1  09.6 - 04.5 %   Platelets 358  145 - 400 10e3/uL   MCV 90.7  79.5 - 101.0 fL   MCH 32.3  25.1 - 34.0 pg   MCHC 35.6  31.5 - 36.0 g/dL   RBC 4.09  8.11 - 9.14 10e6/uL   RDW 13.5  11.2 - 14.5 %   lymph# 2.0  0.9 - 3.3 10e3/uL   MONO# 0.5  0.1 - 0.9 10e3/uL   Eosinophils Absolute 0.1  0.0 - 0.5 10e3/uL   Basophils Absolute 0.0  0.0 - 0.1 10e3/uL   NEUT% 69.3  38.4 - 76.8 %   LYMPH% 22.8  14.0 - 49.7 %   MONO% 5.9  0.0 - 14.0 %   EOS% 1.5  0.0 - 7.0 %   BASO% 0.5  0.0 - 2.0 %  COMPREHENSIVE METABOLIC PANEL (CC13)      Result Value Range   Sodium 137  136 - 145 mEq/L   Potassium 3.8  3.5 - 5.1 mEq/L   Chloride 101  98 - 107 mEq/L   CO2 26  22 - 29 mEq/L   Glucose 96  70 - 99 mg/dl   BUN 78.2  7.0 - 95.6 mg/dL   Creatinine 0.8  0.6 - 1.1 mg/dL   Total Bilirubin 2.13  0.20 - 1.20 mg/dL   Alkaline Phosphatase 63  40 - 150 U/L   AST 14  5 - 34 U/L   ALT 13  0 - 55 U/L   Total Protein 7.1  6.4 - 8.3 g/dL   Albumin 3.7  3.5 - 5.0 g/dL   Calcium 9.9  8.4 - 08.6 mg/dL   I spent 40 minutes face to face with patient discussing anxiety issues:  Daughters,  power bills, absence from work, cancer treatment, vulvar surgery Assessment: extreme stress. Patient concern about vulvar healing which does appear to be doing fine. Hypertension which is well-controlled.  Anxiety  Other malaise and fatigue - Plan: Comprehensive metabolic panel, Vitamin D 25 hydroxy, Vitamin B12, POCT urinalysis dipstick, POCT UA - Microscopic Only  HTN (hypertension) - Plan: CBC with Differential  Signed, Elvina Sidle, MD

## 2012-12-02 LAB — VITAMIN D 25 HYDROXY (VIT D DEFICIENCY, FRACTURES): Vit D, 25-Hydroxy: 33 ng/mL (ref 30–89)

## 2012-12-06 ENCOUNTER — Ambulatory Visit (INDEPENDENT_AMBULATORY_CARE_PROVIDER_SITE_OTHER): Payer: No Typology Code available for payment source | Admitting: Psychiatry

## 2012-12-06 DIAGNOSIS — Z638 Other specified problems related to primary support group: Secondary | ICD-10-CM

## 2012-12-06 DIAGNOSIS — F411 Generalized anxiety disorder: Secondary | ICD-10-CM

## 2012-12-06 DIAGNOSIS — F988 Other specified behavioral and emotional disorders with onset usually occurring in childhood and adolescence: Secondary | ICD-10-CM

## 2012-12-12 ENCOUNTER — Other Ambulatory Visit: Payer: Self-pay | Admitting: Oncology

## 2012-12-20 ENCOUNTER — Ambulatory Visit (INDEPENDENT_AMBULATORY_CARE_PROVIDER_SITE_OTHER): Payer: No Typology Code available for payment source | Admitting: Psychiatry

## 2012-12-20 DIAGNOSIS — Z638 Other specified problems related to primary support group: Secondary | ICD-10-CM

## 2012-12-20 DIAGNOSIS — F411 Generalized anxiety disorder: Secondary | ICD-10-CM

## 2012-12-20 DIAGNOSIS — F988 Other specified behavioral and emotional disorders with onset usually occurring in childhood and adolescence: Secondary | ICD-10-CM

## 2012-12-27 ENCOUNTER — Other Ambulatory Visit (HOSPITAL_BASED_OUTPATIENT_CLINIC_OR_DEPARTMENT_OTHER): Payer: PRIVATE HEALTH INSURANCE | Admitting: Lab

## 2012-12-27 ENCOUNTER — Ambulatory Visit (HOSPITAL_BASED_OUTPATIENT_CLINIC_OR_DEPARTMENT_OTHER): Admitting: Oncology

## 2012-12-27 VITALS — BP 135/87 | HR 102 | Temp 98.9°F | Resp 20 | Ht 69.5 in | Wt 198.7 lb

## 2012-12-27 DIAGNOSIS — C50911 Malignant neoplasm of unspecified site of right female breast: Secondary | ICD-10-CM

## 2012-12-27 DIAGNOSIS — G90519 Complex regional pain syndrome I of unspecified upper limb: Secondary | ICD-10-CM

## 2012-12-27 DIAGNOSIS — G90512 Complex regional pain syndrome I of left upper limb: Secondary | ICD-10-CM

## 2012-12-27 LAB — COMPREHENSIVE METABOLIC PANEL (CC13)
ALT: 18 U/L (ref 0–55)
AST: 18 U/L (ref 5–34)
Alkaline Phosphatase: 67 U/L (ref 40–150)
Creatinine: 0.7 mg/dL (ref 0.6–1.1)
Sodium: 135 mEq/L — ABNORMAL LOW (ref 136–145)
Total Bilirubin: 0.31 mg/dL (ref 0.20–1.20)
Total Protein: 7.3 g/dL (ref 6.4–8.3)

## 2012-12-27 LAB — CBC WITH DIFFERENTIAL/PLATELET
BASO%: 0.4 % (ref 0.0–2.0)
EOS%: 1.8 % (ref 0.0–7.0)
LYMPH%: 31.2 % (ref 14.0–49.7)
MCH: 31.4 pg (ref 25.1–34.0)
MCHC: 34.7 g/dL (ref 31.5–36.0)
MCV: 90.4 fL (ref 79.5–101.0)
MONO#: 0.7 10*3/uL (ref 0.1–0.9)
MONO%: 6.9 % (ref 0.0–14.0)
NEUT%: 59.7 % (ref 38.4–76.8)
Platelets: 432 10*3/uL — ABNORMAL HIGH (ref 145–400)
RBC: 4.62 10*6/uL (ref 3.70–5.45)
WBC: 9.5 10*3/uL (ref 3.9–10.3)

## 2012-12-27 MED ORDER — OXYCODONE-ACETAMINOPHEN 10-325 MG PO TABS: 1.0000 | ORAL_TABLET | ORAL | Status: DC | PRN

## 2012-12-27 NOTE — Progress Notes (Signed)
ID: Madison West OB: 1955/10/07  MR#: [id number]  CSN#:[id number]  PCP: Elvina Sidle, MD GYN:  Teodora Medici SU:  OTHER MD: Danise Edge, Margaretmary Dys, Rennis Golden   HISTORY OF PRESENT ILLNESS: From Dr. Theron Arista Rubin's 07/27/2007 intake note:  "This woman has been in reasonably good health.  She had her last mammogram in 2005, because of her husband's illness and subsequent death, she had deferred subsequent evaluation.  She again underwent a screening mammogram in November, 2008 that showed a potential abnormality, 2.4-cm spiculated mass, retroareolar area, right breast.  Further evaluation was recommended.  Digital diagnostic mammogram and ultrasound performed on 05/24/2007 showed a mobile mass right breast 6 o'clock position.  This measured 1 x 0.7 x 0.9 cm.  at the 4 o'clock position.  There was also a well circumscribed hypoechoic mass measuring  0.6 x 0.4 x 0.7 cm.  Biopsy performed on 06/22/2007 of the right breast mass shows the 3 o'clock lesion of the right breast and an 8 o'clock lesion showed invasive ductal cancer with lymphatic invasion with associated DCIS.  The 3 o'clock lesion showed a hamartoma with microcalcification.  The ductal cancer was ER positive 97%, PR negative 0%, proliferative index 4%, HER-2 was 0.  Preoperative MRI scan 06/12/2007 showed a spiculated mass consistent with known malignancy, anterior lower portion right breast.  Second nodule anterior to this measured 4.3 cm.  BSGI done initially did show two areas of focal uptake.  Patient underwent lumpectomy and sentinel lymph node evaluation on 07/04/2007.  Final pathology showed two separate lesions measuring 2.4 x 0.7 cm.  There was tumor within 0.1 cm of the anterior margin.  Lymphovascular invasion was seen.  This was a grade 2 lesion.  Additional margin tissue from the lateral superior anterior breast: No tumor identified.  Three sentinel lymph nodes were identified.  Tumor was seen in one of  those lymph nodes measuring 0.7 cm."  Her subsequent history is as detailed below.  INTERVAL HISTORY: Madison West returns today for followup of her breast cancer. She is establishing herself in my practice today  REVIEW OF SYSTEMS: She has had multiple hand surgeries And has developed a chronic regional pain syndrome related to that. She has vulvar dysplasia him is status post multiple prior biopsies under the care of Dr. Natasha West. She describes herself as very oblique fatigued, having constant pain in her hip and back, occasional palpitations, stress urinary incontinence, anxiety but not depression or suicidal ideation, and no unusual headaches, no visual changes, no nausea or vomiting, no dizziness, no cough, phlegm production, or pleurisy, and no change in bowel habits. A detailed review of systems was otherwise stable  PAST MEDICAL HISTORY: Past Medical History  Diagnosis Date  . Anxiety   . Depression   . Hypertension   . Breast cancer 06/02/07    r breast  . S/P breast lumpectomy 07/04/07    R breast  . S/P radiation therapy 2009    PAST SURGICAL HISTORY: Past Surgical History  Procedure Laterality Date  . Mastectomy partial / lumpectomy w/ axillary lymphadenectomy      FAMILY HISTORY Family History  Problem Relation Age of Onset  . Cancer Mother     colon  . Heart disease Father     heart attack  . Cancer Brother     colon, 2 brothers   the patient's mother died from colon cancer at the age of 38. He was diagnosed at the age of 24. The patient's father died from  a myocardial infarction at age 19. The patient had 2 brothers, one sister. His sister died at age 28 from a ruptured aneurysm. There is no breast or ovarian cancer history in the family to the patient's knowledge.  GYNECOLOGIC HISTORY:  Menarche age 21, first live birth age 20. She is GX P2. She went through menopause at age 66. She never took hormone replacement.  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 She works part-time at [employer] over and is studying of Presenter, broadcasting. Her husband who was a veteran died in 11/06/2006 from cancers felt to be related to agent orange. The patient's older daughter Madison West lives within the 5 miles of the patient. She is married and works as an Psychologist, educational. The patient is alianated from her son-in-law. The patient's younger daughter, Madison West, is a Archivist. The patient attends a [employer]    ADVANCED DIRECTIVES: Not in place   HEALTH MAINTENANCE: History  Substance Use Topics  . Smoking status: Former Smoker    Quit date: 05/06/1997  . Smokeless tobacco: Never Used  . Alcohol Use: Yes     Comment: 2 daily     Colonoscopy: Under Danise Edge  PAP: Under [name]  Bone density: Pending  Lipid panel:  Allergies  Allergen Reactions  . Vicodin (Hydrocodone-Acetaminophen) Anxiety    Extreme anxiety.    Current Outpatient Prescriptions  Medication Sig Dispense Refill  . ALPRAZolam (XANAX) 1 MG tablet TAKE ONE TO TWO TABS EVERY SIX TO EIGHT HOURS PRN ANXIETY  100 tablet  5  . Calcium Carbonate-Vitamin D (CALCIUM + D PO) Take 600 mg by mouth daily.        Marland Kitchen letrozole (FEMARA) 2.5 MG tablet TAKE 1 TABLET BY MOUTH EVERY DAY  30 tablet  5  . losartan-hydrochlorothiazide (HYZAAR) 100-12.5 MG per tablet Take 1 tablet by mouth daily.  90 tablet  3  . oxyCODONE-acetaminophen (PERCOCET) 10-325 MG per tablet Take 1 tablet by mouth as needed. To take one tablet once or twice daily as needed for back and hip pain.  #60 given to patient per Dr. Welton Flakes to last the patient 6 months until her next visit.  30 tablet  0   Current Facility-Administered Medications  Medication Dose Route Frequency Provider Last Rate Last Dose  . TDaP (BOOSTRIX) injection 0.5 mL  0.5 mL Intramuscular Once Elvina Sidle, MD        OBJECTIVE: Middle-aged white woman in no acute distress Filed Vitals:   12/27/12  1614  BP: 135/87  Pulse: 102  Temp: 98.9 F (37.2 C)  Resp: 20     Body mass index is 28.93 kg/(m^2).    ECOG FS: 0  Sclerae unicteric Oropharynx clear No cervical or supraclavicular adenopathy Lungs no rales or rhonchi Heart regular rate and rhythm Abd benign MSK no focal spinal tenderness including palpation and percussion of the entire spine, no peripheral edema, very limited range of motion of both shoulders, right greater than left, very limited grip on the left Neuro: non-focal, well-oriented, appropriate affect Breasts: Status post right lumpectomy and radiation, with no evidence of local recurrence. The right axilla is benign. The left breast is unremarkable.   LAB RESULTS:  CMP     Component Value Date/Time   NA 135* 12/27/2012 1532   NA 135 12/01/2012 1308   K 3.8 12/27/2012 1532   K 4.0 12/01/2012 1308   CL 101 12/01/2012 1308  CL 101 07/11/2012 1139   CO2 26 12/27/2012 1532   CO2 25 12/01/2012 1308   GLUCOSE 102 12/27/2012 1532   GLUCOSE 98 12/01/2012 1308   GLUCOSE 96 07/11/2012 1139   BUN 8.0 12/27/2012 1532   BUN 11 12/01/2012 1308   CREATININE 0.7 12/27/2012 1532   CREATININE 0.69 12/01/2012 1308   CREATININE 0.70 01/05/2012 1052   CALCIUM 9.6 12/27/2012 1532   CALCIUM 9.9 12/01/2012 1308   PROT 7.3 12/27/2012 1532   PROT 7.0 12/01/2012 1308   ALBUMIN 4.0 12/27/2012 1532   ALBUMIN 4.6 12/01/2012 1308   AST 18 12/27/2012 1532   AST 16 12/01/2012 1308   ALT 18 12/27/2012 1532   ALT 15 12/01/2012 1308   ALKPHOS 67 12/27/2012 1532   ALKPHOS 60 12/01/2012 1308   BILITOT 0.31 12/27/2012 1532   BILITOT 0.5 12/01/2012 1308   GFRNONAA 59* 10/11/2011 0604   GFRAA 69* 10/11/2011 0604    I No results found for this basename: SPEP, UPEP,  kappa and lambda light chains    Lab Results  Component Value Date   WBC 9.5 12/27/2012   NEUTROABS 5.7 12/27/2012   HGB 14.5 12/27/2012   HCT 41.8 12/27/2012   MCV 90.4 12/27/2012   PLT 432* 12/27/2012      Chemistry      Component Value Date/Time   NA 135*  12/27/2012 1532   NA 135 12/01/2012 1308   K 3.8 12/27/2012 1532   K 4.0 12/01/2012 1308   CL 101 12/01/2012 1308   CL 101 07/11/2012 1139   CO2 26 12/27/2012 1532   CO2 25 12/01/2012 1308   BUN 8.0 12/27/2012 1532   BUN 11 12/01/2012 1308   CREATININE 0.7 12/27/2012 1532   CREATININE 0.69 12/01/2012 1308   CREATININE 0.70 01/05/2012 1052      Component Value Date/Time   CALCIUM 9.6 12/27/2012 1532   CALCIUM 9.9 12/01/2012 1308   ALKPHOS 67 12/27/2012 1532   ALKPHOS 60 12/01/2012 1308   AST 18 12/27/2012 1532   AST 16 12/01/2012 1308   ALT 18 12/27/2012 1532   ALT 15 12/01/2012 1308   BILITOT 0.31 12/27/2012 1532   BILITOT 0.5 12/01/2012 1308       Lab Results  Component Value Date   LABCA2 26 07/11/2012    No components found with this basename: [id number]    No results found for this basename: INR,  in the last 168 hours  Urinalysis    Component Value Date/Time   BILIRUBINUR neg 12/01/2012 1326   UROBILINOGEN 0.2 12/01/2012 1326   NITRITE neg 12/01/2012 1326   LEUKOCYTESUR Negative 12/01/2012 1326    STUDIES: No results found.  ASSESSMENT: 57 y.o. [place] woman right lumpectomy and sentinel lymph node sampling 07/04/2007 for a mpT2 pN1, stage IIB invasive ductal carcinoma, grade 2, estrogen receptor 97% positive, progesterone receptor and HER-2 negative, with an MIB-1 of 4%  (1) Oncotype score of 27 predicted an 18% risk of distant recurrence within 10 years if the patient's only systemic therapy was tamoxifen for 5 years  (2) right axillary lymph node dissection 08/05/2007 showed 0 of 16 additional lymph nodes sampled to be involved  (3) status post adjuvant cyclophosphamide/ docetaxel x4, completed may of 2009  (4) status post adjuvant radiation completed 02/24/2008  (5) letrozole started September 2009  PLAN: We spent the better part of today's hour-plus visit going over Lee's diagnosis, treatment history, and prognosis. She understands that, having received letrozole instead of  tamoxifen,  her risk of distance recurrence would be less than 18% at 10 years, probably in the neighborhood of 15%. Furthermore, since she received chemotherapy, which generally lowers risk by about one third, her risk of distant recurrence probably was closer to 10% at 10 years.  However 5 years have passed and she has not had a recurrence. Accordingly I would think her current risk of recurrence in the next 5 years would be in the 5% range. This is obviously very favorable.  She understands that we have currently no data to suggest that continuing letrozole beyond 5 years is helpful, unhelpful, or makes no difference. My practice accordingly is to stop letrozole at 5 years. She is agreeable to this, so she will stop letrozole in September.  She requested Percocet, which she has had failed through this office in the past. I explained that I do not have a problem providing narcotics for cancer related pain, but that she does not have active cancer at present. I did agree to give her 100 Percocet at her request, explaining that I would not expect to refill these in the future. I offered to make a referral to a pain clinic, but she is not interested in that idea. She is going to discuss with her primary care physician and other of her physicians to see who would be willing to continue to fill these medications for her with the understanding that there will be no further refills here.  I have made Madison West an appointment with our genetics counselor Madison West because of her mothers very early colon cancer history as well as her only breast cancer history. I have also set her up for a repeat bone density. We will check with Dr. Danise Edge to see when her next colonoscopy is due. I have referred her back to the lymphedema clinic so she can review the exercises for range of motion and lymphedema control in the right upper extremity. Finally I made her a return appointment with me for November. She will have been off  letrozole 2 months by then. I hope some of her discomfort will have improved. In any case she will "graduate" at that visit.  She knows to call for any problems that may develop before that.   Lowella Dell, MD   12/27/2012 4:35 PM

## 2012-12-28 ENCOUNTER — Telehealth: Payer: Self-pay | Admitting: *Deleted

## 2012-12-28 NOTE — Telephone Encounter (Signed)
Pt called to r/s her appt for labs to be on 05/02/13 @2pm  and her ov for 05/09/13@2pm , because she called [facility] to get an early appt for a bone density than Dec.  i called [facility] to verify that [facility] move her appt for her bone density to 01/05/13 @3pm , in which they did....td

## 2012-12-28 NOTE — Telephone Encounter (Signed)
sw pt gv appt d/t for [facility] 06/05/13 @3pm . Due to [facility] wants to see the pt after [facility] visit i had to schedule the pt for Dec. gv appt d/t for 06/05/13 for labs @ 1:30pm , and ov 06/12/13 @ 2pm. Pt plans on calling to make her appt for the lymphedema clinic. Pt is also aware that LM will call to schedule her appt for genetics. i made the pt aware that i will mail a letter/avs as well...td

## 2012-12-29 ENCOUNTER — Other Ambulatory Visit: Payer: Self-pay | Admitting: Oncology

## 2012-12-29 DIAGNOSIS — C50919 Malignant neoplasm of unspecified site of unspecified female breast: Secondary | ICD-10-CM

## 2012-12-30 ENCOUNTER — Telehealth: Payer: Self-pay | Admitting: *Deleted

## 2012-12-30 ENCOUNTER — Telehealth: Payer: Self-pay | Admitting: Oncology

## 2012-12-30 NOTE — Telephone Encounter (Signed)
sw pt gv aptt for physical therapy for 01/05/13 @11am . Pt is aware.Marland Kitchentd

## 2012-12-30 NOTE — Telephone Encounter (Signed)
Sent letter to patient from Dr. Magrinat. °

## 2013-01-03 ENCOUNTER — Ambulatory Visit (INDEPENDENT_AMBULATORY_CARE_PROVIDER_SITE_OTHER): Payer: PRIVATE HEALTH INSURANCE | Admitting: Psychiatry

## 2013-01-03 DIAGNOSIS — Z638 Other specified problems related to primary support group: Secondary | ICD-10-CM

## 2013-01-03 DIAGNOSIS — F988 Other specified behavioral and emotional disorders with onset usually occurring in childhood and adolescence: Secondary | ICD-10-CM

## 2013-01-03 DIAGNOSIS — F411 Generalized anxiety disorder: Secondary | ICD-10-CM

## 2013-01-05 ENCOUNTER — Ambulatory Visit: Admitting: Physical Therapy

## 2013-01-09 ENCOUNTER — Ambulatory Visit: Payer: Self-pay | Admitting: Oncology

## 2013-01-17 ENCOUNTER — Ambulatory Visit (INDEPENDENT_AMBULATORY_CARE_PROVIDER_SITE_OTHER): Payer: No Typology Code available for payment source | Admitting: Psychiatry

## 2013-01-17 DIAGNOSIS — Z638 Other specified problems related to primary support group: Secondary | ICD-10-CM

## 2013-01-17 DIAGNOSIS — F988 Other specified behavioral and emotional disorders with onset usually occurring in childhood and adolescence: Secondary | ICD-10-CM

## 2013-01-17 DIAGNOSIS — F411 Generalized anxiety disorder: Secondary | ICD-10-CM

## 2013-01-20 ENCOUNTER — Telehealth: Payer: Self-pay

## 2013-01-20 DIAGNOSIS — F419 Anxiety disorder, unspecified: Secondary | ICD-10-CM

## 2013-01-20 DIAGNOSIS — C50119 Malignant neoplasm of central portion of unspecified female breast: Secondary | ICD-10-CM

## 2013-01-20 NOTE — Telephone Encounter (Signed)
Pt states that her insurance will only cover a 25 day supply of the xanax pt was told by the pharmacist to give [place] a call. Pharmacy: [facility] [place]  Best# [phone]

## 2013-01-20 NOTE — Telephone Encounter (Signed)
Pended, please advise

## 2013-01-21 MED ORDER — ALPRAZOLAM 1 MG PO TABS: ORAL_TABLET | ORAL | Status: DC

## 2013-01-21 NOTE — Telephone Encounter (Signed)
Meds ordered this encounter  Medications  . ALPRAZolam (XANAX) 1 MG tablet    Sig: TAKE ONE TO TWO TABS EVERY SIX TO EIGHT HOURS PRN ANXIETY    Dispense:  100 tablet    Refill:  0   Dr. Milus Glazier authorized the above. Per the chart, it was set to print, but I believe it should be called in. Then please advise the patient that Dr. Milus Glazier would like to schedule a visit with her in September.

## 2013-01-21 NOTE — Telephone Encounter (Signed)
Please refill prescription for one month, allowing me time to review with patient in September where to go from here.

## 2013-01-23 NOTE — Telephone Encounter (Signed)
Patient advised.

## 2013-01-25 ENCOUNTER — Telehealth: Payer: Self-pay

## 2013-01-25 NOTE — Telephone Encounter (Signed)
Called it in 

## 2013-01-25 NOTE — Telephone Encounter (Signed)
[facility] [place] [facility] not [facility] for xanax.  [phone]

## 2013-01-31 ENCOUNTER — Ambulatory Visit (INDEPENDENT_AMBULATORY_CARE_PROVIDER_SITE_OTHER): Admitting: Psychiatry

## 2013-01-31 DIAGNOSIS — F411 Generalized anxiety disorder: Secondary | ICD-10-CM

## 2013-01-31 DIAGNOSIS — Z638 Other specified problems related to primary support group: Secondary | ICD-10-CM

## 2013-01-31 DIAGNOSIS — F988 Other specified behavioral and emotional disorders with onset usually occurring in childhood and adolescence: Secondary | ICD-10-CM

## 2013-02-16 ENCOUNTER — Ambulatory Visit (INDEPENDENT_AMBULATORY_CARE_PROVIDER_SITE_OTHER): Payer: PRIVATE HEALTH INSURANCE | Admitting: Family Medicine

## 2013-02-16 ENCOUNTER — Encounter: Payer: Self-pay | Admitting: Family Medicine

## 2013-02-16 VITALS — BP 124/82 | HR 82 | Temp 99.9°F | Resp 16 | Ht 69.0 in | Wt 194.8 lb

## 2013-02-16 DIAGNOSIS — F411 Generalized anxiety disorder: Secondary | ICD-10-CM

## 2013-02-16 DIAGNOSIS — F329 Major depressive disorder, single episode, unspecified: Secondary | ICD-10-CM

## 2013-02-16 MED ORDER — ALPRAZOLAM 2 MG PO TABS: 2.0000 mg | ORAL_TABLET | Freq: Three times a day (TID) | ORAL | Status: DC | PRN

## 2013-02-16 MED ORDER — ALPRAZOLAM 2 MG PO TABS: 2.0000 mg | ORAL_TABLET | Freq: Every evening | ORAL | Status: DC | PRN

## 2013-02-16 NOTE — Progress Notes (Signed)
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 She works part-time at [employer] over and is studying of Presenter, broadcasting. Her husband who was a veteran died in 10/31/06 from cancers felt to be related to agent orange. The patient's older daughter Maisley Hainsworth lives within the 5 miles of the patient. She is married and works as an Psychologist, educational. The patient is alienated from her son-in-law. The patient's younger daughter, Cornell Barman, is a Archivist. The patient attends a [employer]  She is at [employer] for [employer], hoping to work at [employer].  Working through benefits reimbursement for fractured wrist.  Needs refill on pain meds for CRP (type of RSD)  Objective:  NAD, talkative. HEENT:  Unremarkable Chest: clear Heart:  Reg, no murmur Abdomen:  Soft, nontender, no HSM  Generalized anxiety disorder - Plan: alprazolam Prudy Feeler) 2 MG tablet, DISCONTINUED: alprazolam Prudy Feeler) 2 MG tablet  Signed, Elvina Sidle, MD

## 2013-02-28 ENCOUNTER — Ambulatory Visit: Payer: PRIVATE HEALTH INSURANCE | Admitting: Psychiatry

## 2013-03-09 ENCOUNTER — Telehealth: Payer: Self-pay | Admitting: *Deleted

## 2013-03-09 NOTE — Telephone Encounter (Signed)
Confirmed 03/23/13 genetic appt w/ pt.  Mailed calendar to pt.

## 2013-03-14 ENCOUNTER — Ambulatory Visit (INDEPENDENT_AMBULATORY_CARE_PROVIDER_SITE_OTHER): Payer: No Typology Code available for payment source | Admitting: Psychiatry

## 2013-03-14 DIAGNOSIS — Z638 Other specified problems related to primary support group: Secondary | ICD-10-CM

## 2013-03-14 DIAGNOSIS — F411 Generalized anxiety disorder: Secondary | ICD-10-CM

## 2013-03-14 DIAGNOSIS — F988 Other specified behavioral and emotional disorders with onset usually occurring in childhood and adolescence: Secondary | ICD-10-CM

## 2013-03-15 ENCOUNTER — Other Ambulatory Visit: Payer: Self-pay | Admitting: *Deleted

## 2013-03-15 DIAGNOSIS — C50919 Malignant neoplasm of unspecified site of unspecified female breast: Secondary | ICD-10-CM

## 2013-03-15 MED ORDER — LETROZOLE 2.5 MG PO TABS: ORAL_TABLET | ORAL | Status: DC

## 2013-03-15 NOTE — Addendum Note (Signed)
Addended by: Wandalee Ferdinand on: 03/15/2013 11:48 AM   Modules accepted: Orders

## 2013-03-15 NOTE — Telephone Encounter (Addendum)
Order received from Dr. Darnelle Catalan to continue Letrozole until he sees her in November. Patient notified.

## 2013-03-15 NOTE — Telephone Encounter (Signed)
MD office note reflects to stop Letrozole in September and follow up on November. Will f/u with MD. Called patient and she thought she was to continue drug thru November. Instructed her to finish what she has and discontinue medication unless she hears from triage nurse again.

## 2013-03-21 ENCOUNTER — Telehealth: Payer: Self-pay | Admitting: Genetic Counselor

## 2013-03-21 NOTE — Telephone Encounter (Signed)
The patient needs to r/s her appointment.  She is taking care of her daughter who was in a car accident and her daugther's appointment coincides with the genetics appointment. She is a survivor, so she does not need this appointment for surgery decisions.  I transferred her to [facility] to r/s appointment and blood draw.

## 2013-03-22 ENCOUNTER — Telehealth: Payer: Self-pay | Admitting: *Deleted

## 2013-03-22 NOTE — Telephone Encounter (Signed)
Pt called stating that she cannot make her appt for 03/23/13 and needs to reschedule.  I informed her that I would have to get w/ Clydie Braun for a new appt and call her back.  She is fine w/ that.

## 2013-03-23 ENCOUNTER — Encounter: Payer: Self-pay | Admitting: Genetic Counselor

## 2013-03-23 ENCOUNTER — Other Ambulatory Visit: Payer: Self-pay | Admitting: Lab

## 2013-03-27 ENCOUNTER — Telehealth: Payer: Self-pay | Admitting: *Deleted

## 2013-03-27 NOTE — Telephone Encounter (Signed)
Received new appt from Clydie Braun and I called pt and confirmed 03/29/13 genetic appt w/ her.  Emailed Clydie Braun to make her aware.

## 2013-03-28 ENCOUNTER — Ambulatory Visit: Payer: No Typology Code available for payment source | Admitting: Psychiatry

## 2013-03-29 ENCOUNTER — Other Ambulatory Visit: Payer: No Typology Code available for payment source | Admitting: Lab

## 2013-03-29 ENCOUNTER — Encounter (INDEPENDENT_AMBULATORY_CARE_PROVIDER_SITE_OTHER): Payer: Self-pay

## 2013-03-29 ENCOUNTER — Ambulatory Visit (HOSPITAL_BASED_OUTPATIENT_CLINIC_OR_DEPARTMENT_OTHER): Payer: PRIVATE HEALTH INSURANCE | Admitting: Genetic Counselor

## 2013-03-29 ENCOUNTER — Encounter: Payer: Self-pay | Admitting: Genetic Counselor

## 2013-03-29 DIAGNOSIS — Z860100 Personal history of colon polyps, unspecified: Secondary | ICD-10-CM

## 2013-03-29 DIAGNOSIS — C50919 Malignant neoplasm of unspecified site of unspecified female breast: Secondary | ICD-10-CM

## 2013-03-29 DIAGNOSIS — Z800 Family history of malignant neoplasm of digestive organs: Secondary | ICD-10-CM

## 2013-03-29 DIAGNOSIS — Z83719 Family history of colon polyps, unspecified: Secondary | ICD-10-CM

## 2013-03-29 DIAGNOSIS — Z8371 Family history of colonic polyps: Secondary | ICD-10-CM

## 2013-03-29 DIAGNOSIS — IMO0002 Reserved for concepts with insufficient information to code with codable children: Secondary | ICD-10-CM

## 2013-03-29 DIAGNOSIS — C50911 Malignant neoplasm of unspecified site of right female breast: Secondary | ICD-10-CM

## 2013-03-29 NOTE — Progress Notes (Signed)
Dr.  Raymond Gurney Magrinat requested a consultation for genetic counseling and risk assessment for Madison West, a 57 y.o. female, for discussion of her personal history of breast cancer and colon polyps and family history of colon cancer and colon polyps.  She presents to clinic today to discuss the possibility of a genetic predisposition to cancer, and to further clarify her risks, as well as her family members' risks for cancer.   HISTORY OF PRESENT ILLNESS: In 2008, at the age of 20, Madison West was diagnosed with invasive ductal carcinoma of the right breast. This was treated with partial mastectomy, chemotherapy and radiation.  The tumor was ER+/PR-Her2-.  The patient started having colonoscopies at age 61 and has had 3-5 lifetime colon polyps.    Past Medical History  Diagnosis Date  . Anxiety   . Depression   . Hypertension   . Breast cancer 06/02/07    r breast  . S/P breast lumpectomy 07/04/07    R breast  . S/P radiation therapy 2009  . Colon polyps     Past Surgical History  Procedure Laterality Date  . Mastectomy partial / lumpectomy w/ axillary lymphadenectomy      History   Social History  . Marital Status: Widowed    Spouse Name: N/A    Number of Children: 2  . Years of Education: N/A   Occupational History  . server    Social History Main Topics  . Smoking status: Former Smoker -- 0.75 packs/day for 20 years    Types: Cigarettes    Quit date: 05/06/1997  . Smokeless tobacco: Never Used  . Alcohol Use: Yes     Comment: 2 daily  . Drug Use: No  . Sexual Activity: None   Other Topics Concern  . None   Social History Narrative  . None    REPRODUCTIVE HISTORY AND PERSONAL RISK ASSESSMENT FACTORS: Menarche was at age 78.   postmenopausal Uterus Intact: yes Ovaries Intact: yes G2P2A0, first live birth at age 59  She has not previously undergone treatment for infertility.   Oral Contraceptive use: 9 years   She has not used HRT in the past.     FAMILY HISTORY:  We obtained a detailed, 4-generation family history.  Significant diagnoses are listed below: Family History  Problem Relation Age of Onset  . Colon cancer Mother 65  . Heart disease Father     heart attack  . Colon polyps Brother     one brother had large colon polyp that needed surgery to be removed  . Heart disease Paternal Grandfather     Patient's maternal ancestors are of [place] descent, and paternal ancestors are of [place] descent. There is no reported Ashkenazi Jewish ancestry. There is no known consanguinity.  GENETIC COUNSELING ASSESSMENT: Madison West is a 57 y.o. female with a personal history of breast cancer and colon polyps and family history of colon cancer and polyps which somewhat suggestive of a hereditary cancer syndrome and predisposition to cancer. We, therefore, discussed and recommended the following at today's visit.   DISCUSSION: We reviewed the characteristics, features and inheritance patterns of hereditary cancer syndromes. We also discussed genetic testing, including the appropriate family members to test, the process of testing, insurance coverage and turn-around-time for results. We discussed that her mother's early onset of cancer, and the only known colon cancer in the family is most suggestive of either MYH Associated Polyposis, or possibly Lynch syndrome.  We discussed that breast  cancer can be seen in some Lynch syndrome families.  The patient states that her paternal aunt had some form of cancer, but she is not sure what it was.  Based on this information we discussed the comprehensive cancer panel to ensure coverage of the breast and colon cancer genes.Marland Kitchen  PLAN: After considering the risks, benefits, and limitations, Madison West provided informed consent to pursue genetic testing and the blood sample will be sent to [facility] for analysis of the Comprehensive Cancer Panel. We discussed the implications of a positive,  negative and/ or variant of uncertain significance genetic test result. Results should be available within approximately 3-5 weeks' time, at which point they will be disclosed by telephone to Madison West, as will any additional recommendations warranted by these results. Madison West will receive a summary of her genetic counseling visit and a copy of her results once available. This information will also be available in Epic. We encouraged Madison West to remain in contact with cancer genetics annually so that we can continuously update the family history and inform her of any changes in cancer genetics and testing that may be of benefit for her family. Madison West's questions were answered to her satisfaction today. Our contact information was provided should additional questions or concerns arise.  The patient was seen for a total of 60 minutes, greater than 50% of which was spent face-to-face counseling.  This note will also be sent to the referring provider via the electronic medical record. The patient will be supplied with a summary of this genetic counseling discussion as well as educational information on the discussed hereditary cancer syndromes following the conclusion of their visit.   Patient was discussed with Dr. Drue Second.   _______________________________________________________________________ For Office Staff:  Number of people involved in session: 1 Was an Intern/ student involved with case: no

## 2013-04-08 ENCOUNTER — Telehealth: Payer: Self-pay

## 2013-04-08 NOTE — Telephone Encounter (Signed)
Pt would like to speak to Dr L regarding a recent visit. Pt states she wants to only speak with Dr L.  bf

## 2013-04-11 ENCOUNTER — Ambulatory Visit (INDEPENDENT_AMBULATORY_CARE_PROVIDER_SITE_OTHER): Admitting: Psychiatry

## 2013-04-11 DIAGNOSIS — Z638 Other specified problems related to primary support group: Secondary | ICD-10-CM

## 2013-04-11 DIAGNOSIS — F411 Generalized anxiety disorder: Secondary | ICD-10-CM

## 2013-04-11 DIAGNOSIS — F988 Other specified behavioral and emotional disorders with onset usually occurring in childhood and adolescence: Secondary | ICD-10-CM

## 2013-04-17 ENCOUNTER — Telehealth: Payer: Self-pay | Admitting: Genetic Counselor

## 2013-04-17 NOTE — Telephone Encounter (Signed)
Revealed MUTYH mutation and XRCC2 VUS. While this does not explain her breast cancer, it could explain why her mother had colon cancer so young.  We discussed that if her mother had MYH associated polyposis syndrome, then each of her siblings would be at least carriers for one MUTYH mutation and therefore should have full testing of their MUTYH genes, and not single site testing for her mutation only.  Her daughters could consider full MUTYH testing or single site testing, reflexing to full testing if Lee's mutation is found.

## 2013-04-18 ENCOUNTER — Encounter: Payer: Self-pay | Admitting: Genetic Counselor

## 2013-04-25 ENCOUNTER — Ambulatory Visit (INDEPENDENT_AMBULATORY_CARE_PROVIDER_SITE_OTHER): Admitting: Psychiatry

## 2013-04-25 DIAGNOSIS — F411 Generalized anxiety disorder: Secondary | ICD-10-CM

## 2013-04-25 DIAGNOSIS — F988 Other specified behavioral and emotional disorders with onset usually occurring in childhood and adolescence: Secondary | ICD-10-CM

## 2013-04-25 DIAGNOSIS — Z638 Other specified problems related to primary support group: Secondary | ICD-10-CM

## 2013-04-25 NOTE — Telephone Encounter (Signed)
Pt called to cancel her appts for 05/02/13 and 05/09/13. gv appts for 05/17/13 w/labs@ 1pm and ov for 05/25/13 @ 9:30am per pt request...td

## 2013-05-02 ENCOUNTER — Other Ambulatory Visit: Payer: Self-pay | Admitting: Lab

## 2013-05-09 ENCOUNTER — Ambulatory Visit: Payer: Self-pay | Admitting: Oncology

## 2013-05-10 ENCOUNTER — Ambulatory Visit: Payer: No Typology Code available for payment source | Admitting: Psychiatry

## 2013-05-16 ENCOUNTER — Other Ambulatory Visit: Payer: Self-pay | Admitting: Physician Assistant

## 2013-05-16 DIAGNOSIS — C50911 Malignant neoplasm of unspecified site of right female breast: Secondary | ICD-10-CM

## 2013-05-17 ENCOUNTER — Other Ambulatory Visit (HOSPITAL_BASED_OUTPATIENT_CLINIC_OR_DEPARTMENT_OTHER): Payer: No Typology Code available for payment source | Admitting: Lab

## 2013-05-17 ENCOUNTER — Encounter (INDEPENDENT_AMBULATORY_CARE_PROVIDER_SITE_OTHER): Payer: Self-pay

## 2013-05-17 DIAGNOSIS — C50919 Malignant neoplasm of unspecified site of unspecified female breast: Secondary | ICD-10-CM

## 2013-05-17 DIAGNOSIS — C50911 Malignant neoplasm of unspecified site of right female breast: Secondary | ICD-10-CM

## 2013-05-17 LAB — COMPREHENSIVE METABOLIC PANEL (CC13)
ALT: 13 U/L (ref 0–55)
Alkaline Phosphatase: 69 U/L (ref 40–150)
Anion Gap: 14 mEq/L — ABNORMAL HIGH (ref 3–11)
CO2: 23 mEq/L (ref 22–29)
Creatinine: 0.8 mg/dL (ref 0.6–1.1)
Glucose: 122 mg/dl (ref 70–140)
Sodium: 139 mEq/L (ref 136–145)
Total Bilirubin: 0.47 mg/dL (ref 0.20–1.20)
Total Protein: 7.2 g/dL (ref 6.4–8.3)

## 2013-05-17 LAB — CBC WITH DIFFERENTIAL/PLATELET
BASO%: 0.8 % (ref 0.0–2.0)
HCT: 44.1 % (ref 34.8–46.6)
LYMPH%: 24.1 % (ref 14.0–49.7)
MCHC: 33.3 g/dL (ref 31.5–36.0)
MCV: 92.1 fL (ref 79.5–101.0)
MONO%: 6.3 % (ref 0.0–14.0)
NEUT%: 67.5 % (ref 38.4–76.8)
Platelets: 445 10*3/uL — ABNORMAL HIGH (ref 145–400)
RBC: 4.79 10*6/uL (ref 3.70–5.45)

## 2013-05-23 ENCOUNTER — Ambulatory Visit (INDEPENDENT_AMBULATORY_CARE_PROVIDER_SITE_OTHER): Payer: No Typology Code available for payment source | Admitting: Psychiatry

## 2013-05-23 DIAGNOSIS — F411 Generalized anxiety disorder: Secondary | ICD-10-CM

## 2013-05-23 DIAGNOSIS — Z638 Other specified problems related to primary support group: Secondary | ICD-10-CM

## 2013-05-23 DIAGNOSIS — F988 Other specified behavioral and emotional disorders with onset usually occurring in childhood and adolescence: Secondary | ICD-10-CM

## 2013-05-25 ENCOUNTER — Ambulatory Visit (HOSPITAL_BASED_OUTPATIENT_CLINIC_OR_DEPARTMENT_OTHER): Payer: No Typology Code available for payment source | Admitting: Oncology

## 2013-05-25 VITALS — BP 134/87 | HR 101 | Temp 98.5°F | Resp 18 | Ht 69.0 in | Wt 186.8 lb

## 2013-05-25 DIAGNOSIS — C50919 Malignant neoplasm of unspecified site of unspecified female breast: Secondary | ICD-10-CM

## 2013-05-25 DIAGNOSIS — Z170 Estrogen receptor positive status [ER+]: Secondary | ICD-10-CM

## 2013-05-25 DIAGNOSIS — C50511 Malignant neoplasm of lower-outer quadrant of right female breast: Secondary | ICD-10-CM | POA: Insufficient documentation

## 2013-05-25 NOTE — Progress Notes (Signed)
ID: [name] OB: 06-21-1956  MR#: [id number]  CSN#:[id number]  PCP: Elvina Sidle, MD GYN:  Teodora Medici SU:  OTHER MD: Danise Edge, Margaretmary Dys, Rennis Golden   HISTORY OF PRESENT ILLNESS: From Dr. Theron Arista Rubin's 07/27/2007 intake note:  "This woman has been in reasonably good health.  She had her last mammogram in 2005, because of her husband's illness and subsequent death, she had deferred subsequent evaluation.  She again underwent a screening mammogram in November, 2008 that showed a potential abnormality, 2.4-cm spiculated mass, retroareolar area, right breast.  Further evaluation was recommended.  Digital diagnostic mammogram and ultrasound performed on 05/24/2007 showed a mobile mass right breast 6 o'clock position.  This measured 1 x 0.7 x 0.9 cm.  at the 4 o'clock position.  There was also a well circumscribed hypoechoic mass measuring  0.6 x 0.4 x 0.7 cm.  Biopsy performed on 06/22/2007 of the right breast mass shows the 3 o'clock lesion of the right breast and an 8 o'clock lesion showed invasive ductal cancer with lymphatic invasion with associated DCIS.  The 3 o'clock lesion showed a hamartoma with microcalcification.  The ductal cancer was ER positive 97%, PR negative 0%, proliferative index 4%, HER-2 was 0.  Preoperative MRI scan 06/12/2007 showed a spiculated mass consistent with known malignancy, anterior lower portion right breast.  Second nodule anterior to this measured 4.3 cm.  BSGI done initially did show two areas of focal uptake.  Patient underwent lumpectomy and sentinel lymph node evaluation on 07/04/2007.  Final pathology showed two separate lesions measuring 2.4 x 0.7 cm.  There was tumor within 0.1 cm of the anterior margin.  Lymphovascular invasion was seen.  This was a grade 2 lesion.  Additional margin tissue from the lateral superior anterior breast: No tumor identified.  Three sentinel lymph nodes were identified.  Tumor was seen in one of  those lymph nodes measuring 0.7 cm."  Her subsequent history is as detailed below.  INTERVAL HISTORY: Madison West returns today for followup of her breast cancer. She was supposed to have gone off letrozole in September, but for some reason that did not happen. 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 In general, this is of consequence only in homozygotes, in whom it appears to increase the risk of colon cancer.   REVIEW OF SYSTEMS: She is very stressed because this the end of the semester and she is always stressed towards the end of the semester. She gave up her job at [employer], which was part time. She says she really only stayed there because they were paying for her wrist rehabilitation, and that came up to about $50,000. Though they offered to hirer her back, she felt sure there would fire her because "I cost them too much money", so she resigned. Family is doing "pretty good" although her daughter had a significant automobile accident September of this year where her right arm "was flayed". Madison West tells me she sleeps poorly. She is mildly fatigued. Instead of having hot flashes now she is having chills. She hurts in her back and hips and has arthritis in other places as well. This is  both intermittent and constant depending on where a the pain is. Her feet swell sometimes. She feels anxious and depressed. A detailed review of systems today was otherwise stable.  PAST MEDICAL HISTORY: Past Medical History  Diagnosis Date  . Anxiety   .  Depression   . Hypertension   . Breast cancer 06/02/07    r breast  . S/P breast lumpectomy 07/04/07    R breast  . S/P radiation therapy 2007-11-04  . Colon polyps     PAST SURGICAL HISTORY: Past Surgical History  Procedure Laterality Date  . Mastectomy partial / lumpectomy w/ axillary lymphadenectomy      FAMILY HISTORY Family History  Problem Relation  Age of Onset  . Colon cancer Mother 64  . Heart disease Father     heart attack  . Colon polyps Brother     one brother had large colon polyp that needed surgery to be removed  . Heart disease Paternal Grandfather    the patient's mother died from colon cancer at the age of 73. He was diagnosed at the age of 37. The patient's father died from a myocardial infarction at age 19. The patient had 2 brothers, one sister. His sister died at age 53 from a ruptured aneurysm. There is no breast or ovarian cancer history in the family to the patient's knowledge.  GYNECOLOGIC HISTORY:  Menarche age 7, first live birth age 58. She is GX P2. She went through menopause at age 1. She never took hormone replacement.  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 She works part-time at [employer] over and is studying to work in Presenter, broadcasting. Her husband who was a veteran died in Nov 04, 2006 from cancers felt to be related to agent orange. The patient's older daughter Madison West lives within 5 miles of the patient. She is married and works as an Psychologist, educational. The patient is alienated from her son-in-law. The patient's younger daughter, Madison West, is a Archivist. The patient attends a [employer]    ADVANCED DIRECTIVES: Not in place   HEALTH MAINTENANCE: History  Substance Use Topics  . Smoking status: Former Smoker -- 0.75 packs/day for 20 years    Types: Cigarettes    Quit date: 05/06/1997  . Smokeless tobacco: Never Used  . Alcohol Use: Yes     Comment: 2 daily     Colonoscopy: Under Danise Edge, being scheduled  PAP: Under [name]  Bone density: Pending  Lipid panel:  Allergies  Allergen Reactions  . Vicodin [Hydrocodone-Acetaminophen] Anxiety    Extreme anxiety.    Current Outpatient Prescriptions  Medication Sig Dispense Refill  . alprazolam (XANAX) 2 MG tablet Take 1 tablet (2 mg total) by mouth 3 (three) times  daily as needed for sleep.  90 tablet  3  . Calcium Carbonate-Vitamin D (CALCIUM + D PO) Take 600 mg by mouth daily.        Marland Kitchen letrozole (FEMARA) 2.5 MG tablet TAKE 1 TABLET BY MOUTH EVERY DAY  30 tablet  1  . losartan-hydrochlorothiazide (HYZAAR) 100-12.5 MG per tablet Take 1 tablet by mouth daily.  90 tablet  3  . oxyCODONE-acetaminophen (PERCOCET) 10-325 MG per tablet Take 1 tablet by mouth as needed for pain. To take one tablet once or twice daily as needed for back and hip pain.  #60 given to patient per Dr. Welton Flakes to last the patient 6 months until her next visit.  100 tablet  0   Current Facility-Administered Medications  Medication Dose Route Frequency Provider Last Rate Last Dose  . TDaP (BOOSTRIX) injection 0.5 mL  0.5 mL Intramuscular Once Elvina Sidle, MD        OBJECTIVE: Middle-aged white woman who appears restless  Filed Vitals:   05/25/13 1016  BP: 134/87  Pulse: 101  Temp: 98.5 F (36.9 C)  Resp: 18     Body mass index is 27.57 kg/(m^2).    ECOG FS: 1  Sclerae unicteric, pupils equal and round Oropharynx no thrush or other lesions No cervical or supraclavicular adenopathy Lungs no rales or rhonchi Heart regular rate and rhythm Abd soft, nontender, positive bowel sounds MSK no focal spinal tenderness including palpation and percussion of the entire spine, no upper extremity lymphedema Neuro: non-focal, well-oriented, anxious affect Breasts: Status post right lumpectomy and radiation, with no evidence of local recurrence. The right axilla is benign. The left breast is unremarkable.   LAB RESULTS:  CMP     Component Value Date/Time   NA 139 05/17/2013 1345   NA 135 12/01/2012 1308   K 3.7 05/17/2013 1345   K 4.0 12/01/2012 1308   CL 101 12/01/2012 1308   CL 101 07/11/2012 1139   CO2 23 05/17/2013 1345   CO2 25 12/01/2012 1308   GLUCOSE 122 05/17/2013 1345   GLUCOSE 98 12/01/2012 1308   GLUCOSE 96 07/11/2012 1139   BUN 9.1 05/17/2013 1345   BUN 11 12/01/2012 1308    CREATININE 0.8 05/17/2013 1345   CREATININE 0.69 12/01/2012 1308   CREATININE 0.70 01/05/2012 1052   CALCIUM 9.6 05/17/2013 1345   CALCIUM 9.9 12/01/2012 1308   PROT 7.2 05/17/2013 1345   PROT 7.0 12/01/2012 1308   ALBUMIN 3.7 05/17/2013 1345   ALBUMIN 4.6 12/01/2012 1308   AST 14 05/17/2013 1345   AST 16 12/01/2012 1308   ALT 13 05/17/2013 1345   ALT 15 12/01/2012 1308   ALKPHOS 69 05/17/2013 1345   ALKPHOS 60 12/01/2012 1308   BILITOT 0.47 05/17/2013 1345   BILITOT 0.5 12/01/2012 1308   GFRNONAA 59* 10/11/2011 0604   GFRAA 69* 10/11/2011 0604    I No results found for this basename: SPEP,  UPEP,   kappa and lambda light chains    Lab Results  Component Value Date   WBC 9.8 05/17/2013   NEUTROABS 6.6* 05/17/2013   HGB 14.7 05/17/2013   HCT 44.1 05/17/2013   MCV 92.1 05/17/2013   PLT 445* 05/17/2013      Chemistry      Component Value Date/Time   NA 139 05/17/2013 1345   NA 135 12/01/2012 1308   K 3.7 05/17/2013 1345   K 4.0 12/01/2012 1308   CL 101 12/01/2012 1308   CL 101 07/11/2012 1139   CO2 23 05/17/2013 1345   CO2 25 12/01/2012 1308   BUN 9.1 05/17/2013 1345   BUN 11 12/01/2012 1308   CREATININE 0.8 05/17/2013 1345   CREATININE 0.69 12/01/2012 1308   CREATININE 0.70 01/05/2012 1052      Component Value Date/Time   CALCIUM 9.6 05/17/2013 1345   CALCIUM 9.9 12/01/2012 1308   ALKPHOS 69 05/17/2013 1345   ALKPHOS 60 12/01/2012 1308   AST 14 05/17/2013 1345   AST 16 12/01/2012 1308   ALT 13 05/17/2013 1345   ALT 15 12/01/2012 1308   BILITOT 0.47 05/17/2013 1345   BILITOT 0.5 12/01/2012 1308       Lab Results  Component Value Date   LABCA2 26 07/11/2012    No components found with this basename: [id number]    No results found for this basename: INR,  in the last 168 hours  Urinalysis    Component Value Date/Time   BILIRUBINUR neg 12/01/2012 1326  UROBILINOGEN 0.2 12/01/2012 1326   NITRITE neg 12/01/2012 1326   LEUKOCYTESUR Negative 12/01/2012 1326     STUDIES: Bone density results are pending  ASSESSMENT: 57 y.o. [place] woman right lumpectomy and sentinel lymph node sampling 07/04/2007 for a mpT2 pN1, stage IIB invasive ductal carcinoma, grade 2, estrogen receptor 97% positive, progesterone receptor and HER-2 negative, with an MIB-1 of 4%  (1) Oncotype score of 27 predicted an 18% risk of distant recurrence within 10 years if the patient's only systemic therapy was tamoxifen for 5 years  (2) right axillary lymph node dissection 08/05/2007 showed 0 of 16 additional lymph nodes sampled to be involved  (3) status post adjuvant cyclophosphamide/ docetaxel x4, completed may of 2009  (4) status post adjuvant radiation completed 02/24/2008  (5) letrozole started September 2009, discontinued December 2014  (6) Genetic testing revealed one pathogenic mutation in a gene called MUTYH (also known as MYH), specifically  [id number]. Note that " when an individual has two MYH gene mutations, this is associated with an increased risk for adenomatous (precancerous) colon polyps. While there are some studies that suggest individuals with only one MYH gene mutation could be at increased risk for uterine, breast, and colon cancer, the data is conflicting.  At this time, our current thought is that  individuals with only one MYH gene mutation do not have an increased risk for colon polyps. "  PLAN: I reviewed Lee's situations in detail with her. She understands she has completed her antiestrogen therapy and she has a very good prognosis, with a risk of her original breast cancer recurring being less than 10%. We do not have data to continue aromatase inhibitors beyond 5 years we generally we stop.  She has multiple aches and pains which are not related to her diagnosis of cancer and she requested a refill on her Percocet. I was not comfortable to do that particularly since I am releasing her from followup here. She will discuss this further with her  primary care physician.  Madison West understands she will need yearly mammography and a yearly physician breast exam. We will be glad to see her again in the future if the need arises. However as of now no further routine appointments are being made for her here Lowella Dell, MD   05/25/2013 11:09 AM

## 2013-05-26 ENCOUNTER — Telehealth: Payer: Self-pay | Admitting: Oncology

## 2013-05-26 NOTE — Telephone Encounter (Signed)
Sent letter to Dr. Elvina Sidle office from Dr. Darnelle Catalan.

## 2013-05-26 NOTE — Telephone Encounter (Signed)
Sent letter to patient from Dr. Magrinat. °

## 2013-05-26 NOTE — Addendum Note (Signed)
Addended by: Billey Co on: 05/26/2013 11:22 AM   Modules accepted: Orders

## 2013-05-31 ENCOUNTER — Telehealth: Payer: Self-pay | Admitting: Oncology

## 2013-05-31 NOTE — Telephone Encounter (Signed)
Faxed pt medical records to  [facility] @ [address] [address]

## 2013-06-01 ENCOUNTER — Other Ambulatory Visit: Payer: Self-pay

## 2013-06-05 ENCOUNTER — Other Ambulatory Visit: Payer: Self-pay | Admitting: Lab

## 2013-06-06 ENCOUNTER — Ambulatory Visit (INDEPENDENT_AMBULATORY_CARE_PROVIDER_SITE_OTHER): Payer: No Typology Code available for payment source | Admitting: Psychiatry

## 2013-06-06 DIAGNOSIS — F411 Generalized anxiety disorder: Secondary | ICD-10-CM

## 2013-06-06 DIAGNOSIS — F988 Other specified behavioral and emotional disorders with onset usually occurring in childhood and adolescence: Secondary | ICD-10-CM

## 2013-06-06 DIAGNOSIS — Z638 Other specified problems related to primary support group: Secondary | ICD-10-CM

## 2013-06-08 ENCOUNTER — Ambulatory Visit (INDEPENDENT_AMBULATORY_CARE_PROVIDER_SITE_OTHER): Payer: No Typology Code available for payment source | Admitting: Family Medicine

## 2013-06-08 ENCOUNTER — Encounter: Payer: Self-pay | Admitting: Family Medicine

## 2013-06-08 VITALS — BP 106/74 | HR 85 | Temp 98.2°F | Resp 16 | Ht 69.0 in | Wt 186.0 lb

## 2013-06-08 DIAGNOSIS — I10 Essential (primary) hypertension: Secondary | ICD-10-CM

## 2013-06-08 DIAGNOSIS — G90512 Complex regional pain syndrome I of left upper limb: Secondary | ICD-10-CM

## 2013-06-08 DIAGNOSIS — G90519 Complex regional pain syndrome I of unspecified upper limb: Secondary | ICD-10-CM

## 2013-06-08 DIAGNOSIS — F411 Generalized anxiety disorder: Secondary | ICD-10-CM

## 2013-06-08 MED ORDER — ALPRAZOLAM 2 MG PO TABS: 2.0000 mg | ORAL_TABLET | Freq: Three times a day (TID) | ORAL | Status: DC | PRN

## 2013-06-08 MED ORDER — OXYCODONE-ACETAMINOPHEN 10-325 MG PO TABS: 1.0000 | ORAL_TABLET | ORAL | Status: DC | PRN

## 2013-06-08 MED ORDER — LOSARTAN POTASSIUM-HCTZ 50-12.5 MG PO TABS: 1.0000 | ORAL_TABLET | Freq: Every day | ORAL | Status: DC

## 2013-06-08 NOTE — Progress Notes (Signed)
57 yo woman who was working at [employer] but resigned when her hours were cut and her wrist became very sore.  Objective:  NAD Left hand:  Palm blanches with full finger extension.  Small nodule on middle finger extensor tendon over metacarpal area.  Still cannot make full fist. Chest:  Clear Heart:  Reg, no murmur Abdomen:  Soft, nontender.  CRPS (complex regional pain syndrome), upper limb, left - Plan: oxyCODONE-acetaminophen (PERCOCET) 10-325 MG per tablet  Generalized anxiety disorder - Plan: alprazolam (XANAX) 2 MG tablet  Hypertension - Plan: losartan-hydrochlorothiazide (HYZAAR) 50-12.5 MG per tablet  Signed, Elvina Sidle, MD

## 2013-06-12 ENCOUNTER — Telehealth: Payer: Self-pay

## 2013-06-12 ENCOUNTER — Ambulatory Visit: Payer: Self-pay | Admitting: Oncology

## 2013-06-12 NOTE — Telephone Encounter (Signed)
Dr.Lauenstein, Pt states that she has a deep cough as well as a sore throat, she would like to know if you could prescribe her something for this without seeing her because she does not want to become sicker by waiting in the lobby with other pts. Best# [phone]

## 2013-06-13 ENCOUNTER — Ambulatory Visit (INDEPENDENT_AMBULATORY_CARE_PROVIDER_SITE_OTHER): Payer: No Typology Code available for payment source | Admitting: Family Medicine

## 2013-06-13 VITALS — BP 122/80 | HR 80 | Temp 99.9°F | Resp 16 | Ht 69.0 in | Wt 186.0 lb

## 2013-06-13 DIAGNOSIS — J329 Chronic sinusitis, unspecified: Secondary | ICD-10-CM

## 2013-06-13 DIAGNOSIS — M810 Age-related osteoporosis without current pathological fracture: Secondary | ICD-10-CM

## 2013-06-13 DIAGNOSIS — J209 Acute bronchitis, unspecified: Secondary | ICD-10-CM

## 2013-06-13 MED ORDER — LEVOFLOXACIN 500 MG PO TABS: 500.0000 mg | ORAL_TABLET | Freq: Every day | ORAL | Status: DC

## 2013-06-13 MED ORDER — ALENDRONATE SODIUM 70 MG PO TABS: 70.0000 mg | ORAL_TABLET | ORAL | Status: DC

## 2013-06-13 MED ORDER — BENZONATATE 200 MG PO CAPS: 200.0000 mg | ORAL_CAPSULE | Freq: Three times a day (TID) | ORAL | Status: DC | PRN

## 2013-06-13 NOTE — Telephone Encounter (Signed)
She can try Mucinex and Ibuprofen, if not helpful she should come in for this. Called her to advise. She will come in tonight.

## 2013-06-13 NOTE — Patient Instructions (Signed)
Bronchitis Bronchitis is the body's way of reacting to injury and/or infection (inflammation) of the bronchi. Bronchi are the air tubes that extend from the windpipe into the lungs. If the inflammation becomes severe, it may cause shortness of breath. CAUSES  Inflammation may be caused by:  A virus.  Germs (bacteria).  Dust.  Allergens.  Pollutants and many other irritants. The cells lining the bronchial tree are covered with tiny hairs (cilia). These constantly beat upward, away from the lungs, toward the mouth. This keeps the lungs free of pollutants. When these cells become too irritated and are unable to do their job, mucus begins to develop. This causes the characteristic cough of bronchitis. The cough clears the lungs when the cilia are unable to do their job. Without either of these protective mechanisms, the mucus would settle in the lungs. Then you would develop pneumonia. Smoking is a common cause of bronchitis and can contribute to pneumonia. Stopping this habit is the single most important thing you can do to help yourself. TREATMENT   Your caregiver may prescribe an antibiotic if the cough is caused by bacteria. Also, medicines that open up your airways make it easier to breathe. Your caregiver may also recommend or prescribe an expectorant. It will loosen the mucus to be coughed up. Only take over-the-counter or prescription medicines for pain, discomfort, or fever as directed by your caregiver.  Removing whatever causes the problem (smoking, for example) is critical to preventing the problem from getting worse.  Cough suppressants may be prescribed for relief of cough symptoms.  Inhaled medicines may be prescribed to help with symptoms now and to help prevent problems from returning.  For those with recurrent (chronic) bronchitis, there may be a need for steroid medicines. SEEK IMMEDIATE MEDICAL CARE IF:   During treatment, you develop more pus-like mucus (purulent  sputum).  You have a fever.  You become progressively more ill.  You have increased difficulty breathing, wheezing, or shortness of breath. It is necessary to seek immediate medical care if you are elderly or sick from any other disease. MAKE SURE YOU:   Understand these instructions.  Will watch your condition.  Will get help right away if you are not doing well or get worse. Document Released: 06/08/2005 Document Revised: 02/08/2013 Document Reviewed: 01/31/2013 [facility]ExitCare Patient Information 2014 [place]. Sinusitis Sinusitis is redness, soreness, and swelling (inflammation) of the paranasal sinuses. Paranasal sinuses are air pockets within the bones of your face (beneath the eyes, the middle of the forehead, or above the eyes). In healthy paranasal sinuses, mucus is able to drain out, and air is able to circulate through them by way of your nose. However, when your paranasal sinuses are inflamed, mucus and air can become trapped. This can allow bacteria and other germs to grow and cause infection. Sinusitis can develop quickly and last only a short time (acute) or continue over a long period (chronic). Sinusitis that lasts for more than 12 weeks is considered chronic.  CAUSES  Causes of sinusitis include:  Allergies.  Structural abnormalities, such as displacement of the cartilage that separates your nostrils (deviated septum), which can decrease the air flow through your nose and sinuses and affect sinus drainage.  Functional abnormalities, such as when the small hairs (cilia) that line your sinuses and help remove mucus do not work properly or are not present. SYMPTOMS  Symptoms of acute and chronic sinusitis are the same. The primary symptoms are pain and pressure around the affected sinuses. Other  symptoms include:  Upper toothache.  Earache.  Headache.  Bad breath.  Decreased sense of smell and taste.  A cough, which worsens when you are lying  flat.  Fatigue.  Fever.  Thick drainage from your nose, which often is green and may contain pus (purulent).  Swelling and warmth over the affected sinuses. DIAGNOSIS  Your caregiver will perform a physical exam. During the exam, your caregiver may:  Look in your nose for signs of abnormal growths in your nostrils (nasal polyps).  Tap over the affected sinus to check for signs of infection.  View the inside of your sinuses (endoscopy) with a special imaging device with a light attached (endoscope), which is inserted into your sinuses. If your caregiver suspects that you have chronic sinusitis, one or more of the following tests may be recommended:  Allergy tests.  Nasal culture A sample of mucus is taken from your nose and sent to a lab and screened for bacteria.  Nasal cytology A sample of mucus is taken from your nose and examined by your caregiver to determine if your sinusitis is related to an allergy. TREATMENT  Most cases of acute sinusitis are related to a viral infection and will resolve on their own within 10 days. Sometimes medicines are prescribed to help relieve symptoms (pain medicine, decongestants, nasal steroid sprays, or saline sprays).  However, for sinusitis related to a bacterial infection, your caregiver will prescribe antibiotic medicines. These are medicines that will help kill the bacteria causing the infection.  Rarely, sinusitis is caused by a fungal infection. In theses cases, your caregiver will prescribe antifungal medicine. For some cases of chronic sinusitis, surgery is needed. Generally, these are cases in which sinusitis recurs more than 3 times per year, despite other treatments. HOME CARE INSTRUCTIONS   Drink plenty of water. Water helps thin the mucus so your sinuses can drain more easily.  Use a humidifier.  Inhale steam 3 to 4 times a day (for example, sit in the bathroom with the shower running).  Apply a warm, moist washcloth to your face 3  to 4 times a day, or as directed by your caregiver.  Use saline nasal sprays to help moisten and clean your sinuses.  Take over-the-counter or prescription medicines for pain, discomfort, or fever only as directed by your caregiver. SEEK IMMEDIATE MEDICAL CARE IF:  You have increasing pain or severe headaches.  You have nausea, vomiting, or drowsiness.  You have swelling around your face.  You have vision problems.  You have a stiff neck.  You have difficulty breathing. MAKE SURE YOU:   Understand these instructions.  Will watch your condition.  Will get help right away if you are not doing well or get worse. Document Released: 06/08/2005 Document Revised: 08/31/2011 Document Reviewed: 06/23/2011 [facility] Patient Information 2014 [place], [place]. Osteoporosis Throughout your life, your body breaks down old bone and replaces it with new bone. As you get older, your body does not replace bone as quickly as it breaks it down. By the age of 30 years, most people begin to gradually lose bone because of the imbalance between bone loss and replacement. Some people lose more bone than others. Bone loss beyond a specified normal degree is considered osteoporosis.  Osteoporosis affects the strength and durability of your bones. The inside of the ends of your bones and your flat bones, like the bones of your pelvis, look like honeycomb, filled with tiny open spaces. As bone loss occurs, your bones become less  dense. This means that the open spaces inside your bones become bigger and the walls between these spaces become thinner. This makes your bones weaker. Bones of a person with osteoporosis can become so weak that they can break (fracture) during minor accidents, such as a simple fall. CAUSES  The following factors have been associated with the development of osteoporosis:  Smoking.  Drinking more than 2 alcoholic drinks several days per week.  Long-term use of certain  medicines:  Corticosteroids.  Chemotherapy medicines.  Thyroid medicines.  Antiepileptic medicines.  Gonadal hormone suppression medicine.  Immunosuppression medicine.  Being underweight.  Lack of physical activity.  Lack of exposure to the sun. This can lead to vitamin D deficiency.  Certain medical conditions:  Certain inflammatory bowel diseases, such as Crohn disease and ulcerative colitis.  Diabetes.  Hyperthyroidism.  Hyperparathyroidism. RISK FACTORS Anyone can develop osteoporosis. However, the following factors can increase your risk of developing osteoporosis:  Gender Women are at higher risk than men.  Age Being older than 50 years increases your risk.  Ethnicity White and Asian people have an increased risk.  Weight Being extremely underweight can increase your risk of osteoporosis.  Family history of osteoporosis Having a family member who has developed osteoporosis can increase your risk. SYMPTOMS  Usually, people with osteoporosis have no symptoms.  DIAGNOSIS  Signs during a physical exam that may prompt your caregiver to suspect osteoporosis include:  Decreased height. This is usually caused by the compression of the bones that form your spine (vertebrae) because they have weakened and become fractured.  A curving or rounding of the upper back (kyphosis). To confirm signs of osteoporosis, your caregiver may request a procedure that uses 2 low-dose X-ray beams with different levels of energy to measure your bone mineral density (dual-energy X-ray absorptiometry [DXA]). Also, your caregiver may check your level of vitamin D. TREATMENT  The goal of osteoporosis treatment is to strengthen bones in order to decrease the risk of bone fractures. There are different types of medicines available to help achieve this goal. Some of these medicines work by slowing the processes of bone loss. Some medicines work by increasing bone density. Treatment also involves  making sure that your levels of calcium and vitamin D are adequate. PREVENTION  There are things you can do to help prevent osteoporosis. Adequate intake of calcium and vitamin D can help you achieve optimal bone mineral density. Regular exercise can also help, especially resistance and weight-bearing activities. If you smoke, quitting smoking is an important part of osteoporosis prevention. MAKE SURE YOU:  Understand these instructions.  Will watch your condition.  Will get help right away if you are not doing well or get worse. FOR MORE INFORMATION [email or web address] and [email or web address] Document Released: 03/18/2005 Document Revised: 10/03/2012 Document Reviewed: 05/23/2011 [facility] Patient Information 2014 [place], [place].

## 2013-06-13 NOTE — Progress Notes (Signed)
Subjective:    Patient ID: Madison West, female    DOB: 12/27/1955, 57 y.o.   MRN: [id number] This chart was scribed for Elvina Sidle, MD by Valera Castle, ED Scribe. This patient was seen in room 9 and the patient's care was started at 6:04 PM.  Chief Complaint  Patient presents with   Cough   Facial Pain    HPI Madison West is a 57 y.o. female who presents to the [facility] complaining of intermittent cough, productive of sputum, with associated chest congestion, onset 4 days ago. She reports her symptoms have worsened today. She reports her chest congestion is moving up to her sinuses. She reports associated sinus pressure. She reports her daughters have recently been sick, her oldest with a sinus infection.     She denies any other symptoms. Pt reports quitting smoking 10 years ago, 1 cigarette per day. She denies taking Levaquin or Cipro. She reports her daughter works as [place] technician.   Discussed bone density results with pt, per last visit. Pt had bone density scan that showed T score - 8%. Pt has h/o osteopetrosis.   PCP Elvina Sidle, MD  Patient Active Problem List   Diagnosis Date Noted   Breast cancer of lower-outer quadrant of right female breast 05/25/2013   Sciatica of right side 10/19/2012   CRPS (complex regional pain syndrome), upper limb 10/19/2012   History of DVT (deep vein thrombosis) 10/05/2011   Cellulitis of breast 10/04/2011   Hypertension 10/04/2011   Anxiety 10/04/2011   Depression 10/04/2011   S/P breast lumpectomy    Past Medical History  Diagnosis Date   Anxiety    Depression    Hypertension    Breast cancer 06/02/07    r breast   S/P breast lumpectomy 07/04/07    R breast   S/P radiation therapy 2009   Colon polyps    Past Surgical History  Procedure Laterality Date   Mastectomy partial / lumpectomy w/ axillary lymphadenectomy     Allergies  Allergen Reactions   Vicodin [Hydrocodone-Acetaminophen]  Anxiety    Extreme anxiety.   Prior to Admission medications   Medication Sig Start Date End Date Taking? Authorizing Provider  alprazolam Prudy Feeler) 2 MG tablet Take 1 tablet (2 mg total) by mouth 3 (three) times daily as needed for sleep. 06/08/13  Yes Elvina Sidle, MD  Calcium Carbonate-Vitamin D (CALCIUM + D PO) Take 600 mg by mouth daily.     Yes Historical Provider, MD  losartan-hydrochlorothiazide (HYZAAR) 50-12.5 MG per tablet Take 1 tablet by mouth daily. 06/08/13  Yes Elvina Sidle, MD  oxyCODONE-acetaminophen (PERCOCET) 10-325 MG per tablet Take 1 tablet by mouth as needed for pain. To take one tablet once or twice daily as needed for back and hip pain.  #60 given to patient per Dr. Welton Flakes to last the patient 6 months until her next visit. 06/08/13  Yes Elvina Sidle, MD   Family History  Problem Relation Age of Onset   Colon cancer Mother 24   Heart disease Father     heart attack   Colon polyps Brother     one brother had large colon polyp that needed surgery to be removed   Heart disease Paternal Grandfather    History   Social History   Marital Status: Widowed    Spouse Name: N/A    Number of Children: 2   Years of Education: N/A   Occupational History   server    Social History  Main Topics   Smoking status: Former Smoker -- 0.75 packs/day for 20 years    Types: Cigarettes    Quit date: 05/06/1997   Smokeless tobacco: Never Used   Alcohol Use: Yes     Comment: 2 daily   Drug Use: No   Sexual Activity: Not on file   Other Topics Concern   Not on file   Social History Narrative   No narrative on file    Review of Systems  HENT: Positive for congestion (chest and nasal) and sinus pressure.   Respiratory: Positive for cough.        Objective:   Physical Exam  BP 122/80   Pulse 80   Temp(Src) 99.9 F (37.7 C) (Oral)   Resp 16   Ht 5\' 9"  (1.753 m)   Wt 186 lb (84.369 kg)   BMI 27.45 kg/m2   SpO2 97%  Nursing note and vitals  reviewed. Constitutional: Pt is oriented to person, place, and time. Pt appears well-developed and well-nourished. No distress.  HENT:  Head: Normocephalic and atraumatic.  Eyes: EOM are normal. Pupils are equal, round, and reactive to light.  Neck: Neck supple. No tracheal deviation present.  Cardiovascular: Normal rate, regular rhythm and normal heart sounds.  Exam reveals no gallop and no friction rub. No murmur heard. Pulmonary/Chest: Effort normal and breath sounds normal. No respiratory distress. Pt has no wheezes. Pt has no rales.  Abdominal: Soft. Bowel sounds are normal. There is no tenderness. There is no rebound and no guarding.  Musculoskeletal: Normal range of motion.  Neurological: Pt is alert and oriented to person, place, and time.  Skin: Skin is warm and dry.  Psychiatric: Pt has a normal mood and affect. Pt's behavior is normal.   Pt has h/o osteopetrosis. Discussed bone density results with pt. Pt had bone density scan that showed T score -5. 8%.     Assessment & Plan:   Acute bronchitis - Plan: levofloxacin (LEVAQUIN) 500 MG tablet, benzonatate (TESSALON) 200 MG capsule  Sinusitis - Plan: levofloxacin (LEVAQUIN) 500 MG tablet  Osteoporosis, unspecified - Plan: alendronate (FOSAMAX) 70 MG tablet Resume multivitamin D. and calcium Signed, Elvina Sidle, MD      I personally performed the services described in this documentation, which was scribed in my presence. The recorded information has been reviewed and is accurate.

## 2013-06-20 ENCOUNTER — Ambulatory Visit: Payer: No Typology Code available for payment source | Admitting: Psychiatry

## 2013-07-04 ENCOUNTER — Ambulatory Visit (INDEPENDENT_AMBULATORY_CARE_PROVIDER_SITE_OTHER): Admitting: Psychiatry

## 2013-07-04 DIAGNOSIS — Z638 Other specified problems related to primary support group: Secondary | ICD-10-CM

## 2013-07-04 DIAGNOSIS — F411 Generalized anxiety disorder: Secondary | ICD-10-CM

## 2013-07-04 DIAGNOSIS — F988 Other specified behavioral and emotional disorders with onset usually occurring in childhood and adolescence: Secondary | ICD-10-CM

## 2013-07-18 ENCOUNTER — Ambulatory Visit (INDEPENDENT_AMBULATORY_CARE_PROVIDER_SITE_OTHER): Admitting: Psychiatry

## 2013-07-18 DIAGNOSIS — Z638 Other specified problems related to primary support group: Secondary | ICD-10-CM

## 2013-07-18 DIAGNOSIS — F411 Generalized anxiety disorder: Secondary | ICD-10-CM

## 2013-07-18 DIAGNOSIS — F988 Other specified behavioral and emotional disorders with onset usually occurring in childhood and adolescence: Secondary | ICD-10-CM

## 2013-08-01 ENCOUNTER — Ambulatory Visit (INDEPENDENT_AMBULATORY_CARE_PROVIDER_SITE_OTHER): Admitting: Psychiatry

## 2013-08-01 DIAGNOSIS — F988 Other specified behavioral and emotional disorders with onset usually occurring in childhood and adolescence: Secondary | ICD-10-CM

## 2013-08-01 DIAGNOSIS — F411 Generalized anxiety disorder: Secondary | ICD-10-CM

## 2013-08-01 DIAGNOSIS — Z638 Other specified problems related to primary support group: Secondary | ICD-10-CM

## 2013-08-15 ENCOUNTER — Ambulatory Visit: Payer: No Typology Code available for payment source | Admitting: Psychiatry

## 2013-08-29 ENCOUNTER — Ambulatory Visit (INDEPENDENT_AMBULATORY_CARE_PROVIDER_SITE_OTHER): Admitting: Psychiatry

## 2013-08-29 DIAGNOSIS — Z638 Other specified problems related to primary support group: Secondary | ICD-10-CM

## 2013-08-29 DIAGNOSIS — F988 Other specified behavioral and emotional disorders with onset usually occurring in childhood and adolescence: Secondary | ICD-10-CM

## 2013-08-29 DIAGNOSIS — F411 Generalized anxiety disorder: Secondary | ICD-10-CM

## 2013-09-06 ENCOUNTER — Encounter: Payer: Self-pay | Admitting: Family Medicine

## 2013-09-07 ENCOUNTER — Ambulatory Visit (INDEPENDENT_AMBULATORY_CARE_PROVIDER_SITE_OTHER): Admitting: Family Medicine

## 2013-09-07 VITALS — BP 132/94 | HR 91 | Temp 98.8°F | Resp 16 | Ht 69.5 in | Wt 192.0 lb

## 2013-09-07 DIAGNOSIS — F3289 Other specified depressive episodes: Secondary | ICD-10-CM

## 2013-09-07 DIAGNOSIS — M549 Dorsalgia, unspecified: Secondary | ICD-10-CM

## 2013-09-07 DIAGNOSIS — F411 Generalized anxiety disorder: Secondary | ICD-10-CM

## 2013-09-07 DIAGNOSIS — M25559 Pain in unspecified hip: Secondary | ICD-10-CM

## 2013-09-07 DIAGNOSIS — I10 Essential (primary) hypertension: Secondary | ICD-10-CM

## 2013-09-07 DIAGNOSIS — F329 Major depressive disorder, single episode, unspecified: Secondary | ICD-10-CM

## 2013-09-07 DIAGNOSIS — M25552 Pain in left hip: Secondary | ICD-10-CM

## 2013-09-07 DIAGNOSIS — F32A Depression, unspecified: Secondary | ICD-10-CM

## 2013-09-07 MED ORDER — LOSARTAN POTASSIUM-HCTZ 50-12.5 MG PO TABS: 1.0000 | ORAL_TABLET | Freq: Every day | ORAL | Status: DC

## 2013-09-07 MED ORDER — FLUOXETINE HCL 20 MG PO TABS: 20.0000 mg | ORAL_TABLET | Freq: Every day | ORAL | Status: DC

## 2013-09-07 MED ORDER — ALPRAZOLAM 2 MG PO TABS: 2.0000 mg | ORAL_TABLET | Freq: Three times a day (TID) | ORAL | Status: DC | PRN

## 2013-09-07 MED ORDER — OXYCODONE-ACETAMINOPHEN 10-325 MG PO TABS: 1.0000 | ORAL_TABLET | ORAL | Status: DC | PRN

## 2013-09-07 NOTE — Progress Notes (Signed)
Subjective:  This chart was scribed for Robyn Haber, MD by Donato Schultz, Medical Scribe. This patient was seen in Room 27 and the patient's care was started at 11:55 AM.   Patient ID: Madison West, female    DOB: 1955/12/09, 58 y.o.   MRN: [id number]  HPI HPI Comments: Madison West is a 58 y.o. female who presents to the [facility] for a medication refill.  She states that she does not take her pain medication everyday.  She states that she has not taken her Fosamax after hearing that it could cause bone fractures.  She states that she has doubled her Vitamin D dosage to compensate for not taking the Fosamax.    The patient is also complaining of intermittent back and left hip pain.  She states that laying down aggravates the left hip pain.      The patient states that she is still in school and is taking a very heavy course load.  The patient states that her daughter is still in school and her other daughter is about to get re-certified for her ultrasound boards.  She states that she has been feeling depressed lately after her 89 year old cat died and she found out her daughter is pregnant.    Past Medical History  Diagnosis Date   Anxiety    Depression    Hypertension    Breast cancer 06/02/07    r breast   S/P breast lumpectomy 07/04/07    R breast   S/P radiation therapy 2009   Colon polyps    Past Surgical History  Procedure Laterality Date   Mastectomy partial / lumpectomy w/ axillary lymphadenectomy     Family History  Problem Relation Age of Onset   Colon cancer Mother 36   Heart disease Father     heart attack   Colon polyps Brother     one brother had large colon polyp that needed surgery to be removed   Heart disease Paternal Grandfather    History   Social History   Marital Status: Widowed    Spouse Name: N/A    Number of Children: 2   Years of Education: N/A   Occupational History   server    Social  History Main Topics   Smoking status: Former Smoker -- 0.75 packs/day for 20 years    Types: Cigarettes    Quit date: 05/06/1997   Smokeless tobacco: Never Used   Alcohol Use: Yes     Comment: 2 daily   Drug Use: No   Sexual Activity: Not on file   Other Topics Concern   Not on file   Social History Narrative   No narrative on file   Allergies  Allergen Reactions   Vicodin [Hydrocodone-Acetaminophen] Anxiety    Extreme anxiety.    Review of Systems  All other systems reviewed and are negative.     Objective:  Physical Exam  Nursing note and vitals reviewed. Constitutional: She is oriented to person, place, and time. She appears well-developed and well-nourished. No distress.  HENT:  Head: Normocephalic and atraumatic.  Eyes: EOM are normal.  Neck: Neck supple. No tracheal deviation present.  Cardiovascular: Normal rate.   Pulmonary/Chest: Effort normal. No respiratory distress.  Musculoskeletal: Normal range of motion.  Neurological: She is alert and oriented to person, place, and time.  Skin: Skin is warm and dry.  Psychiatric: She has a normal mood and affect. Her behavior is normal.  BP 132/94   Pulse 91   Temp(Src) 98.8 F (37.1 C)   Resp 16   Ht 5' 9.5" (1.765 m)   Wt 192 lb (87.091 kg)   BMI 27.96 kg/m2   SpO2 97% Assessment & Plan:   Patient seems stable at this point. She's had a number of things happen that are upsetting: Daughter getting pregnant, Dying, dog getting his glaucoma [place] his [place].  I personally performed the services described in this documentation, which was scribed in my presence. The recorded information has been reviewed and is accurate.  Hypertension - Plan: losartan-hydrochlorothiazide (HYZAAR) 50-12.5 MG per tablet  Generalized anxiety disorder - Plan: alprazolam (XANAX) 2 MG tablet  Hip pain, left  Back pain - Plan: alprazolam (XANAX) 2 MG tablet  Depression - Plan: FLUoxetine (PROZAC) 20 MG  tablet  Signed, Robyn Haber, MD

## 2013-09-12 ENCOUNTER — Ambulatory Visit (INDEPENDENT_AMBULATORY_CARE_PROVIDER_SITE_OTHER): Admitting: Psychiatry

## 2013-09-12 DIAGNOSIS — F988 Other specified behavioral and emotional disorders with onset usually occurring in childhood and adolescence: Secondary | ICD-10-CM

## 2013-09-12 DIAGNOSIS — F411 Generalized anxiety disorder: Secondary | ICD-10-CM

## 2013-09-12 DIAGNOSIS — Z638 Other specified problems related to primary support group: Secondary | ICD-10-CM

## 2013-09-26 ENCOUNTER — Ambulatory Visit (INDEPENDENT_AMBULATORY_CARE_PROVIDER_SITE_OTHER): Admitting: Psychiatry

## 2013-09-26 DIAGNOSIS — Z638 Other specified problems related to primary support group: Secondary | ICD-10-CM

## 2013-09-26 DIAGNOSIS — F411 Generalized anxiety disorder: Secondary | ICD-10-CM

## 2013-09-26 DIAGNOSIS — F988 Other specified behavioral and emotional disorders with onset usually occurring in childhood and adolescence: Secondary | ICD-10-CM

## 2013-10-04 ENCOUNTER — Ambulatory Visit (INDEPENDENT_AMBULATORY_CARE_PROVIDER_SITE_OTHER): Admitting: Family Medicine

## 2013-10-04 ENCOUNTER — Encounter: Payer: Self-pay | Admitting: Family Medicine

## 2013-10-04 ENCOUNTER — Ambulatory Visit

## 2013-10-04 VITALS — BP 122/70 | HR 89 | Temp 98.1°F | Resp 16 | Ht 67.5 in | Wt 196.0 lb

## 2013-10-04 DIAGNOSIS — M549 Dorsalgia, unspecified: Secondary | ICD-10-CM

## 2013-10-04 LAB — POCT URINALYSIS DIPSTICK
Bilirubin, UA: NEGATIVE
Glucose, UA: NEGATIVE
Ketones, UA: NEGATIVE
Leukocytes, UA: NEGATIVE
Nitrite, UA: NEGATIVE
Protein, UA: NEGATIVE
Spec Grav, UA: 1.015
Urobilinogen, UA: 0.2
pH, UA: 7

## 2013-10-04 LAB — POCT UA - MICROSCOPIC ONLY
Casts, Ur, LPF, POC: NEGATIVE
Crystals, Ur, HPF, POC: NEGATIVE
Mucus, UA: NEGATIVE
Yeast, UA: NEGATIVE

## 2013-10-04 MED ORDER — DICLOFENAC SODIUM 75 MG PO TBEC: 75.0000 mg | DELAYED_RELEASE_TABLET | Freq: Two times a day (BID) | ORAL | Status: DC

## 2013-10-04 MED ORDER — CYCLOBENZAPRINE HCL 10 MG PO TABS: 10.0000 mg | ORAL_TABLET | Freq: Every day | ORAL | Status: DC

## 2013-10-04 NOTE — Progress Notes (Signed)
This chart was scribed for Robyn Haber, MD by Vernell Barrier, Medical Scribe. This patient's care was started at 8:24 PM.  This is a 58 y.o.female w/hx of back pain complains of gradually worsening mid to lower back pain that intermittently radiates down to the legs, onset 1 week ago. Onset of pain associated with right foot numbness, onset today. Pain is different from past back pain she reports. Has pain medication which she has been taking. Survivor of breast cancer.  Gerlene Fee denies any fever, nausea, urinary symptoms, bowel problems, numbness in the legs, or loss of motor power.   Past Medical History  Diagnosis Date   Anxiety    Depression    Hypertension    Breast cancer 06/02/07    r breast   S/P breast lumpectomy 07/04/07    R breast   S/P radiation therapy 2009   Colon polyps      Past Surgical History  Procedure Laterality Date   Mastectomy partial / lumpectomy w/ axillary lymphadenectomy      Objective:  middle-aged female in no acute distress. Blood pressure 122/70, pulse 89, temperature 98.1 F (36.7 C), temperature source Oral, resp. rate 16, height 5' 7.5" (1.715 m), weight 196 lb (88.905 kg), SpO2 98.00%.Body mass index is 30.23 kg/(m^2). Palpation of the back reveals no localized back tenderness. No right knee jerk or right ankle jerk. Left knee jerk and ankle jerk normal. Straight leg raise positive on the right causing left sided pain. No abnormal weakness. No CVA tenderness.  Results for orders placed in visit on 10/04/13  POCT URINALYSIS DIPSTICK      Result Value Ref Range   Color, UA yellow     Clarity, UA clear     Glucose, UA neg     Bilirubin, UA neg     Ketones, UA neg     Spec Grav, UA 1.015     Blood, UA trace     pH, UA 7.0     Protein, UA neg     Urobilinogen, UA 0.2     Nitrite, UA neg     Leukocytes, UA Negative    POCT UA - MICROSCOPIC ONLY      Result Value Ref Range   WBC, Ur, HPF, POC 0-1     RBC, urine,  microscopic 0-2     Bacteria, U Microscopic trace     Mucus, UA neg     Epithelial cells, urine per micros 0-2     Crystals, Ur, HPF, POC neg     Casts, Ur, LPF, POC neg     Yeast, UA neg     [facility] reading (PRIMARY) by  Dr. Joseph Art  L-S spine spondylosis.  L4 mild compression.  Slight subluxation of L3 on L4    Assessment/Plan: Acute lower back pain without acute neurological findings.  Back pain - Plan: POCT urinalysis dipstick, POCT UA - Microscopic Only, DG Lumbar Spine 2-3 Views, diclofenac (VOLTAREN) 75 MG EC tablet, cyclobenzaprine (FLEXERIL) 10 MG tablet  Signed, Robyn Haber, MD

## 2013-10-10 ENCOUNTER — Ambulatory Visit (INDEPENDENT_AMBULATORY_CARE_PROVIDER_SITE_OTHER): Admitting: Psychiatry

## 2013-10-10 DIAGNOSIS — F988 Other specified behavioral and emotional disorders with onset usually occurring in childhood and adolescence: Secondary | ICD-10-CM

## 2013-10-10 DIAGNOSIS — F411 Generalized anxiety disorder: Secondary | ICD-10-CM

## 2013-10-10 DIAGNOSIS — Z638 Other specified problems related to primary support group: Secondary | ICD-10-CM

## 2013-10-24 ENCOUNTER — Ambulatory Visit (INDEPENDENT_AMBULATORY_CARE_PROVIDER_SITE_OTHER): Admitting: Psychiatry

## 2013-10-24 DIAGNOSIS — F988 Other specified behavioral and emotional disorders with onset usually occurring in childhood and adolescence: Secondary | ICD-10-CM

## 2013-10-24 DIAGNOSIS — F411 Generalized anxiety disorder: Secondary | ICD-10-CM

## 2013-10-24 DIAGNOSIS — Z638 Other specified problems related to primary support group: Secondary | ICD-10-CM

## 2013-11-07 ENCOUNTER — Ambulatory Visit (INDEPENDENT_AMBULATORY_CARE_PROVIDER_SITE_OTHER): Admitting: Psychiatry

## 2013-11-07 DIAGNOSIS — F988 Other specified behavioral and emotional disorders with onset usually occurring in childhood and adolescence: Secondary | ICD-10-CM

## 2013-11-07 DIAGNOSIS — F411 Generalized anxiety disorder: Secondary | ICD-10-CM

## 2013-11-07 DIAGNOSIS — Z638 Other specified problems related to primary support group: Secondary | ICD-10-CM

## 2013-11-21 ENCOUNTER — Ambulatory Visit: Admitting: Psychiatry

## 2013-12-05 ENCOUNTER — Ambulatory Visit (INDEPENDENT_AMBULATORY_CARE_PROVIDER_SITE_OTHER): Admitting: Psychiatry

## 2013-12-05 DIAGNOSIS — Z638 Other specified problems related to primary support group: Secondary | ICD-10-CM

## 2013-12-05 DIAGNOSIS — F411 Generalized anxiety disorder: Secondary | ICD-10-CM

## 2013-12-05 DIAGNOSIS — F988 Other specified behavioral and emotional disorders with onset usually occurring in childhood and adolescence: Secondary | ICD-10-CM

## 2013-12-06 ENCOUNTER — Other Ambulatory Visit: Payer: Self-pay | Admitting: Gynecology

## 2013-12-11 ENCOUNTER — Encounter: Payer: Self-pay | Admitting: Family Medicine

## 2013-12-11 ENCOUNTER — Ambulatory Visit (INDEPENDENT_AMBULATORY_CARE_PROVIDER_SITE_OTHER): Admitting: Family Medicine

## 2013-12-11 VITALS — BP 124/82 | HR 82 | Temp 98.3°F | Resp 16 | Ht 69.0 in | Wt 196.8 lb

## 2013-12-11 DIAGNOSIS — I10 Essential (primary) hypertension: Secondary | ICD-10-CM

## 2013-12-11 DIAGNOSIS — M545 Low back pain, unspecified: Secondary | ICD-10-CM

## 2013-12-11 DIAGNOSIS — F411 Generalized anxiety disorder: Secondary | ICD-10-CM

## 2013-12-11 LAB — COMPLETE METABOLIC PANEL WITH GFR
ALT: 17 U/L (ref 0–35)
AST: 17 U/L (ref 0–37)
Albumin: 4.4 g/dL (ref 3.5–5.2)
Alkaline Phosphatase: 61 U/L (ref 39–117)
BUN: 9 mg/dL (ref 6–23)
CO2: 26 mEq/L (ref 19–32)
Calcium: 10 mg/dL (ref 8.4–10.5)
Chloride: 100 mEq/L (ref 96–112)
Creat: 0.71 mg/dL (ref 0.50–1.10)
GFR, Est African American: >89 mL/min
GFR, Est Non African American: >89 mL/min
Glucose, Bld: 94 mg/dL (ref 70–99)
Potassium: 4.2 mEq/L (ref 3.5–5.3)
Sodium: 136 mEq/L (ref 135–145)
Total Bilirubin: 0.4 mg/dL (ref 0.2–1.2)
Total Protein: 6.8 g/dL (ref 6.0–8.3)

## 2013-12-11 MED ORDER — OXYCODONE-ACETAMINOPHEN 10-325 MG PO TABS: 1.0000 | ORAL_TABLET | ORAL | Status: DC | PRN

## 2013-12-11 MED ORDER — LOSARTAN POTASSIUM-HCTZ 50-12.5 MG PO TABS: 1.0000 | ORAL_TABLET | Freq: Every day | ORAL | Status: DC

## 2013-12-11 MED ORDER — ALPRAZOLAM 2 MG PO TABS: 2.0000 mg | ORAL_TABLET | Freq: Three times a day (TID) | ORAL | Status: DC | PRN

## 2013-12-11 NOTE — Progress Notes (Signed)
This is a 58 year old widowed woman who went back to school to be retrained her husband died. He is a English as a second language teacher and she is covered under veterans benefits until November.   She's had some setbacks recently. She was found to have severe dysplasia Dr. Romilda Garret office last week. She is looking for a job actively but has discontinued her [place] because of back pain. Pain is tolerable taking 1 Percocet daily.  Her daughter lost her job at any pain as an Magazine features editor. This is been very stressful for the family.  Objective: Patient's no acute distress, has good eye contact, and is very talkative. HEENT: Unremarkable Neck: Supple no adenopathy or bruits Chest: Clear Heart: Regular no murmur Extremities: Normal gait, no edema  Assessment:Essential hypertension - Plan: COMPLETE METABOLIC PANEL WITH GFR, losartan-hydrochlorothiazide (HYZAAR) 50-12.5 MG per tablet  Generalized anxiety disorder - Plan: alprazolam (XANAX) 2 MG tablet  Right low back pain, with sciatica presence unspecified - Plan: oxyCODONE-acetaminophen (PERCOCET) 10-325 MG per tablet  Signed, Robyn Haber, MD

## 2014-01-02 ENCOUNTER — Ambulatory Visit (INDEPENDENT_AMBULATORY_CARE_PROVIDER_SITE_OTHER): Admitting: Psychiatry

## 2014-01-02 DIAGNOSIS — F988 Other specified behavioral and emotional disorders with onset usually occurring in childhood and adolescence: Secondary | ICD-10-CM

## 2014-01-02 DIAGNOSIS — Z638 Other specified problems related to primary support group: Secondary | ICD-10-CM

## 2014-01-02 DIAGNOSIS — F411 Generalized anxiety disorder: Secondary | ICD-10-CM

## 2014-01-16 ENCOUNTER — Ambulatory Visit (INDEPENDENT_AMBULATORY_CARE_PROVIDER_SITE_OTHER): Admitting: Psychiatry

## 2014-01-16 ENCOUNTER — Ambulatory Visit: Admitting: Psychiatry

## 2014-01-16 DIAGNOSIS — F411 Generalized anxiety disorder: Secondary | ICD-10-CM

## 2014-01-16 DIAGNOSIS — Z638 Other specified problems related to primary support group: Secondary | ICD-10-CM

## 2014-01-16 DIAGNOSIS — F988 Other specified behavioral and emotional disorders with onset usually occurring in childhood and adolescence: Secondary | ICD-10-CM

## 2014-01-17 ENCOUNTER — Telehealth: Payer: Self-pay

## 2014-01-17 DIAGNOSIS — F411 Generalized anxiety disorder: Secondary | ICD-10-CM

## 2014-01-17 DIAGNOSIS — I10 Essential (primary) hypertension: Secondary | ICD-10-CM

## 2014-01-17 NOTE — Telephone Encounter (Signed)
Error

## 2014-01-18 MED ORDER — LOSARTAN POTASSIUM-HCTZ 50-12.5 MG PO TABS: 1.0000 | ORAL_TABLET | Freq: Every day | ORAL | Status: DC

## 2014-01-18 MED ORDER — ALPRAZOLAM 2 MG PO TABS: 2.0000 mg | ORAL_TABLET | Freq: Three times a day (TID) | ORAL | Status: DC | PRN

## 2014-01-18 NOTE — Telephone Encounter (Signed)
Patient states she is waiting for her blood pressure medications sent to [facility] Mail order   SSN [id number]   dob: Jan 31, 2056  Fax  [phone]  alprazolam Duanne Moron) 2 MG tablet losartan-hydrochlorothiazide (HYZAAR) 50-12.5 MG per tablet   Call  [phone]

## 2014-01-19 NOTE — Telephone Encounter (Signed)
Faxed script for Xanax as requested

## 2014-01-30 ENCOUNTER — Ambulatory Visit: Admitting: Psychiatry

## 2014-02-13 ENCOUNTER — Ambulatory Visit: Admitting: Psychiatry

## 2014-02-27 ENCOUNTER — Ambulatory Visit: Admitting: Psychiatry

## 2014-03-12 ENCOUNTER — Encounter: Payer: Self-pay | Admitting: Family Medicine

## 2014-03-12 ENCOUNTER — Ambulatory Visit (INDEPENDENT_AMBULATORY_CARE_PROVIDER_SITE_OTHER): Admitting: Family Medicine

## 2014-03-12 VITALS — BP 120/70 | HR 70 | Temp 98.6°F | Resp 16

## 2014-03-12 DIAGNOSIS — M545 Low back pain, unspecified: Secondary | ICD-10-CM

## 2014-03-12 DIAGNOSIS — F411 Generalized anxiety disorder: Secondary | ICD-10-CM

## 2014-03-12 LAB — CBC WITH DIFFERENTIAL/PLATELET
Basophils Absolute: 0.1 10*3/uL (ref 0.0–0.1)
Basophils Relative: 1 % (ref 0–1)
Eosinophils Absolute: 0.1 10*3/uL (ref 0.0–0.7)
Eosinophils Relative: 2 % (ref 0–5)
HCT: 42.4 % (ref 36.0–46.0)
Hemoglobin: 14.6 g/dL (ref 12.0–15.0)
Lymphocytes Relative: 30 % (ref 12–46)
Lymphs Abs: 2.1 10*3/uL (ref 0.7–4.0)
MCH: 31.3 pg (ref 26.0–34.0)
MCHC: 34.4 g/dL (ref 30.0–36.0)
MCV: 91 fL (ref 78.0–100.0)
Monocytes Absolute: 0.4 10*3/uL (ref 0.1–1.0)
Monocytes Relative: 6 % (ref 3–12)
Neutro Abs: 4.2 10*3/uL (ref 1.7–7.7)
Neutrophils Relative %: 61 % (ref 43–77)
Platelets: 412 10*3/uL — ABNORMAL HIGH (ref 150–400)
RBC: 4.66 MIL/uL (ref 3.87–5.11)
RDW: 13 % (ref 11.5–15.5)
WBC: 6.9 10*3/uL (ref 4.0–10.5)

## 2014-03-12 MED ORDER — CYCLOBENZAPRINE HCL 10 MG PO TABS: 10.0000 mg | ORAL_TABLET | Freq: Every day | ORAL | Status: DC

## 2014-03-12 MED ORDER — OXYCODONE-ACETAMINOPHEN 10-325 MG PO TABS: 1.0000 | ORAL_TABLET | ORAL | Status: DC | PRN

## 2014-03-12 MED ORDER — ALPRAZOLAM 2 MG PO TABS: 2.0000 mg | ORAL_TABLET | Freq: Three times a day (TID) | ORAL | Status: DC | PRN

## 2014-03-12 NOTE — Progress Notes (Signed)
@[email or web address] @  Patient ID: Madison West MRN: [id number], DOB: 10-11-1955, 58 y.o. Date of Encounter: 03/12/2014, 12:11 PM  Primary Physician: Robyn Haber, MD  This chart was scribed for Robyn Haber, MD by Rayfield Citizen, medical scribe. This patient was seen in room 27 and the patient's care was started at 12:15 PM.   Patient explains that her family is going through a stressful time; one daughter, Myriam Jacobson, 80, brought a cold home, and her other daughter, Loma Sousa, 52, recently had a baby. 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   She notes that she has recently developed headaches as a result of her stressful, math-based school work. She also reports intermittent back pain. Her hip pain is intermittent as well. Headaches were associated with left scotoma and were intense, bilateral, and went from occiput to bifrontal.  No vomiting.  Headaches resolved over 12 hours.  Patient states that she is "just not happy." She does not want to take antidepressants. She believes that if she were able to get a job (she has been unemployed for the past year) she would be able to be in a better place emotionally. She is very stressed.   She is trying to keep her anxiety medication dosage down; her emotional state is "not any worse, but not any better."   Chief Complaint:   HPI: 58 y.o. year old female with history below presents with chronic anxiety and intermittent chronic right sciatica and low back pain.  No fevers.  No incontinence or muscle wasting.   Past Medical History  Diagnosis Date  . Anxiety   . Depression   . Hypertension   . Breast cancer 06/02/07    r breast  . S/P breast lumpectomy 07/04/07    R breast  . S/P radiation therapy 2009  . Colon polyps      Home Meds: Prior to Admission medications   Medication Sig Start Date End Date Taking? Authorizing  Provider  alprazolam Duanne Moron) 2 MG tablet Take 1 tablet (2 mg total) by mouth 3 (three) times daily as needed for sleep. 01/18/14   Robyn Haber, MD  Calcium Carbonate-Vitamin D (CALCIUM + D PO) Take 600 mg by mouth daily.      Historical Provider, MD  cyclobenzaprine (FLEXERIL) 10 MG tablet Take 1 tablet (10 mg total) by mouth at bedtime. 10/04/13   Robyn Haber, MD  FLUoxetine (PROZAC) 20 MG tablet Take 1 tablet (20 mg total) by mouth daily. 09/07/13   Robyn Haber, MD  losartan-hydrochlorothiazide (HYZAAR) 50-12.5 MG per tablet Take 1 tablet by mouth daily. 01/18/14   Robyn Haber, MD  oxyCODONE-acetaminophen (PERCOCET) 10-325 MG per tablet Take 1 tablet by mouth as needed for pain. To take one tablet once or twice daily as needed for back and hip pain.  #60 given to patient per Dr. Humphrey Rolls to last the patient 6 months until her next visit. 12/11/13   Robyn Haber, MD    Allergies:  Allergies  Allergen Reactions  . Vicodin [Hydrocodone-Acetaminophen] Anxiety    Extreme anxiety.    History   Social History  . Marital Status: Widowed    Spouse Name: N/A    Number of Children: 2  . Years of Education: N/A   Occupational History  . server    Social History Main Topics  . Smoking status: Former Smoker -- 0.75 packs/day for 20 years  Types: Cigarettes    Quit date: 05/06/1997  . Smokeless tobacco: Never Used  . Alcohol Use: Yes     Comment: 2 daily  . Drug Use: No  . Sexual Activity: Not on file   Other Topics Concern  . Not on file   Social History Narrative  . No narrative on file     Review of Systems: Constitutional: negative for chills, fever, night sweats, weight changes, or fatigue  HEENT: negative for vision changes, hearing loss, congestion, rhinorrhea, ST, epistaxis, or sinus pressure Cardiovascular: negative for chest pain or palpitations Respiratory: negative for hemoptysis, wheezing, shortness of breath, or cough Abdominal: negative for abdominal  pain, nausea, vomiting, diarrhea, or constipation Dermatological: negative for rash Neurologic: negative for headache, dizziness, or syncope All other systems reviewed and are otherwise negative with the exception to those above and in the HPI.  Resp: 16 HR: 60  BP: 120/70 Temp: 98.6  Physical Exam: There were no vitals taken for this visit., There is no weight on file to calculate BMI. General: Well developed, well nourished, in no acute distress. Head: Normocephalic, atraumatic, eyes without discharge, sclera non-icteric, nares are without discharge. Bilateral auditory canals clear, TM's are without perforation, pearly grey and translucent with reflective cone of light bilaterally. Oral cavity moist, posterior pharynx without exudate, erythema, peritonsillar abscess, or post nasal drip.  Neck: Supple. No thyromegaly. Full ROM. No lymphadenopathy. Lungs: Clear bilaterally to auscultation without wheezes, rales, or rhonchi. Breathing is unlabored. Heart: RRR with S1 S2. No murmurs, rubs, or gallops appreciated. Abdomen: Soft, non-tender, non-distended with normoactive bowel sounds. No hepatomegaly. No rebound/guarding. No obvious abdominal masses. Msk:  Strength and tone normal for age. Extremities/Skin: Warm and dry. No clubbing or cyanosis. No edema. No rashes or suspicious lesions. Neuro: Alert and oriented X 3. Moves all extremities spontaneously. Gait is normal. CNII-XII grossly in tact. Psych:  Responds to questions appropriately although speech is pressured     ASSESSMENT AND PLAN:  58 y.o. year old female with Generalized anxiety disorder - Plan: alprazolam (XANAX) 2 MG tablet, Comprehensive metabolic panel, CBC with Differential  Low back pain, unspecified back pain laterality, with sciatica presence unspecified - Plan: cyclobenzaprine (FLEXERIL) 10 MG tablet, Comprehensive metabolic panel, CBC with Differential  Right low back pain, with sciatica presence unspecified - Plan:  oxyCODONE-acetaminophen (PERCOCET) 10-325 MG per tablet   Signed, Robyn Haber, MD 03/12/2014 12:11 PM

## 2014-03-13 LAB — COMPREHENSIVE METABOLIC PANEL
ALT: 12 U/L (ref 0–35)
AST: 14 U/L (ref 0–37)
Albumin: 4.1 g/dL (ref 3.5–5.2)
Alkaline Phosphatase: 51 U/L (ref 39–117)
BUN: 8 mg/dL (ref 6–23)
CO2: 29 mEq/L (ref 19–32)
Calcium: 9.2 mg/dL (ref 8.4–10.5)
Chloride: 101 mEq/L (ref 96–112)
Creat: 0.71 mg/dL (ref 0.50–1.10)
Glucose, Bld: 99 mg/dL (ref 70–99)
Potassium: 4.2 mEq/L (ref 3.5–5.3)
Sodium: 136 mEq/L (ref 135–145)
Total Bilirubin: 0.5 mg/dL (ref 0.2–1.2)
Total Protein: 6.3 g/dL (ref 6.0–8.3)

## 2014-04-16 ENCOUNTER — Other Ambulatory Visit: Payer: Self-pay

## 2014-04-16 DIAGNOSIS — F411 Generalized anxiety disorder: Secondary | ICD-10-CM

## 2014-04-16 DIAGNOSIS — M545 Low back pain: Secondary | ICD-10-CM

## 2014-04-16 NOTE — Telephone Encounter (Signed)
Pt called in and states that she needs her Zanax and Flexeril sent to [facility] order [facility] in [place].  They are free for her through the mail. They script MUST have her full name, last 4 of ss #-which is [id number], and dob. They will not fill it if these three things are not in the script. She can be reached @ [phone]. Thank you

## 2014-04-17 MED ORDER — ALPRAZOLAM 2 MG PO TABS: 2.0000 mg | ORAL_TABLET | Freq: Three times a day (TID) | ORAL | Status: DC | PRN

## 2014-04-17 MED ORDER — CYCLOBENZAPRINE HCL 10 MG PO TABS: 10.0000 mg | ORAL_TABLET | Freq: Every day | ORAL | Status: DC

## 2014-04-17 NOTE — Telephone Encounter (Signed)
Faxed. Pt advised. 

## 2014-04-17 NOTE — Telephone Encounter (Signed)
Pt needs refills- please advise if approved. We can fax manually as pt directed below.

## 2014-05-03 ENCOUNTER — Ambulatory Visit (INDEPENDENT_AMBULATORY_CARE_PROVIDER_SITE_OTHER): Admitting: Psychiatry

## 2014-05-03 DIAGNOSIS — F411 Generalized anxiety disorder: Secondary | ICD-10-CM

## 2014-05-03 DIAGNOSIS — F909 Attention-deficit hyperactivity disorder, unspecified type: Secondary | ICD-10-CM

## 2014-05-03 DIAGNOSIS — Z638 Other specified problems related to primary support group: Secondary | ICD-10-CM

## 2014-06-01 ENCOUNTER — Telehealth: Payer: Self-pay

## 2014-06-01 NOTE — Telephone Encounter (Signed)
Spoke to patient regarding flu shot.  She states that she doesn't take the flu shot.

## 2014-06-07 ENCOUNTER — Telehealth: Payer: Self-pay

## 2014-06-07 ENCOUNTER — Ambulatory Visit (INDEPENDENT_AMBULATORY_CARE_PROVIDER_SITE_OTHER): Admitting: Family Medicine

## 2014-06-07 ENCOUNTER — Other Ambulatory Visit: Payer: Self-pay | Admitting: Radiology

## 2014-06-07 ENCOUNTER — Encounter: Payer: Self-pay | Admitting: Family Medicine

## 2014-06-07 VITALS — BP 131/91 | HR 89 | Temp 97.6°F | Resp 16 | Ht 69.5 in | Wt 206.0 lb

## 2014-06-07 DIAGNOSIS — M545 Low back pain, unspecified: Secondary | ICD-10-CM

## 2014-06-07 DIAGNOSIS — I10 Essential (primary) hypertension: Secondary | ICD-10-CM

## 2014-06-07 DIAGNOSIS — F411 Generalized anxiety disorder: Secondary | ICD-10-CM

## 2014-06-07 MED ORDER — LOSARTAN POTASSIUM-HCTZ 50-12.5 MG PO TABS: 1.0000 | ORAL_TABLET | Freq: Every day | ORAL | Status: DC

## 2014-06-07 MED ORDER — ALPRAZOLAM 2 MG PO TABS
2.0000 mg | ORAL_TABLET | Freq: Three times a day (TID) | ORAL | Status: DC | PRN
Start: 2014-06-07 — End: 2014-12-07

## 2014-06-07 MED ORDER — OXYCODONE-ACETAMINOPHEN 10-325 MG PO TABS: 1.0000 | ORAL_TABLET | ORAL | Status: DC | PRN

## 2014-06-07 MED ORDER — CYCLOBENZAPRINE HCL 10 MG PO TABS: 10.0000 mg | ORAL_TABLET | Freq: Every day | ORAL | Status: DC

## 2014-06-07 NOTE — Progress Notes (Signed)
58 yo woman here for hypertension recheck.  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  Having intermittent low back pain.  No chest pain, shortness of breath  Objective:  NAD  110/80 Chest clear Heart:  Reg, no murmur Abdomen:  Soft, no HSM, nontendre Back: nontender left flank.  Area of pain is left lower scapula Skin:  Clear  Assessment:  Stable with current meds.     ICD-9-CM ICD-10-CM   1. Essential hypertension 401.9 I10 losartan-hydrochlorothiazide (HYZAAR) 50-12.5 MG per tablet  2. Low back pain, unspecified back pain laterality, with sciatica presence unspecified 724.2 M54.5 cyclobenzaprine (FLEXERIL) 10 MG tablet  3. Generalized anxiety disorder 300.02 F41.1 alprazolam (XANAX) 2 MG tablet  4. Right low back pain, with sciatica presence unspecified 724.2 M54.5   5. Left-sided low back pain without sciatica 724.2 M54.5 oxyCODONE-acetaminophen (PERCOCET) 10-325 MG per tablet     Signed, Robyn Haber, MD

## 2014-06-07 NOTE — Telephone Encounter (Signed)
error 

## 2014-06-21 LAB — HM MAMMOGRAPHY

## 2014-07-04 ENCOUNTER — Encounter: Payer: Self-pay | Admitting: *Deleted

## 2014-12-06 ENCOUNTER — Ambulatory Visit: Admitting: Family Medicine

## 2014-12-07 ENCOUNTER — Ambulatory Visit (INDEPENDENT_AMBULATORY_CARE_PROVIDER_SITE_OTHER): Admitting: Family Medicine

## 2014-12-07 VITALS — BP 128/80 | HR 96 | Temp 99.4°F | Resp 17 | Ht 68.5 in | Wt 207.0 lb

## 2014-12-07 DIAGNOSIS — Z171 Estrogen receptor negative status [ER-]: Secondary | ICD-10-CM

## 2014-12-07 DIAGNOSIS — I10 Essential (primary) hypertension: Secondary | ICD-10-CM

## 2014-12-07 DIAGNOSIS — J209 Acute bronchitis, unspecified: Secondary | ICD-10-CM

## 2014-12-07 DIAGNOSIS — F411 Generalized anxiety disorder: Secondary | ICD-10-CM

## 2014-12-07 DIAGNOSIS — M545 Low back pain, unspecified: Secondary | ICD-10-CM

## 2014-12-07 DIAGNOSIS — Z800 Family history of malignant neoplasm of digestive organs: Secondary | ICD-10-CM | POA: Diagnosis not present

## 2014-12-07 DIAGNOSIS — C50919 Malignant neoplasm of unspecified site of unspecified female breast: Secondary | ICD-10-CM

## 2014-12-07 MED ORDER — AZITHROMYCIN 250 MG PO TABS: ORAL_TABLET | ORAL | Status: DC

## 2014-12-07 MED ORDER — LOSARTAN POTASSIUM-HCTZ 50-12.5 MG PO TABS: 1.0000 | ORAL_TABLET | Freq: Every day | ORAL | Status: DC

## 2014-12-07 MED ORDER — ALPRAZOLAM 2 MG PO TABS
2.0000 mg | ORAL_TABLET | Freq: Three times a day (TID) | ORAL | Status: DC | PRN
Start: 2014-12-07 — End: 2015-07-03

## 2014-12-07 MED ORDER — CYCLOBENZAPRINE HCL 10 MG PO TABS: 10.0000 mg | ORAL_TABLET | Freq: Every day | ORAL | Status: DC

## 2014-12-07 MED ORDER — OXYCODONE-ACETAMINOPHEN 10-325 MG PO TABS: 1.0000 | ORAL_TABLET | ORAL | Status: DC | PRN

## 2014-12-07 MED ORDER — AZITHROMYCIN 250 MG PO TABS: 250.0000 mg | ORAL_TABLET | Freq: Every day | ORAL | Status: DC

## 2014-12-07 NOTE — Progress Notes (Signed)
° °  Subjective:    Patient ID: Madison West, female    DOB: 08/19/1955, 59 y.o.   MRN: [id number] This chart was scribed for Robyn Haber, MD by Zola Button, Medical Scribe. This patient was seen in Room 9 and the patient's care was started at 3:31 PM.   HPI HPI Comments: Madison West is a 59 y.o. female who presents to the [facility] complaining of gradual onset cough that started yesterday. Her symptoms initially started with a sore throat that started 4 days ago. The sore throat has mostly improved; she is primarily complaining of cough at this time.  Patient started colonoscopies at the age of 63 due to FMHx of colon cancer in her mother. She started with colonoscopies every 3 years, but then switched to every 5 years. Her last colonoscopy was 2011.  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 She is also working at [employer], 3 days a week.  Review of Systems  HENT: Positive for sore throat.   Respiratory: Positive for cough.        Objective:   Physical Exam CONSTITUTIONAL: Well developed/well nourished HEAD: Normocephalic/atraumatic EYES: EOM/PERRL ENMT: Mucous membranes moist NECK: supple no meningeal signs SPINE: entire spine nontender CV: S1/S2 noted, no murmurs/rubs/gallops noted LUNGS: Bilateral expiratory wheezes ABDOMEN: soft, nontender, no rebound or guarding GU: no cva tenderness NEURO: Pt is awake/alert, moves all extremitiesx4 EXTREMITIES: pulses normal, full ROM SKIN: warm, color normal PSYCH: no abnormalities of mood noted  We reviewed patient's general condition and need for ongoing medication. Face-to-face time 30 minutes    Assessment & Plan:  Patient seems to be doing fairly well now, having gotten situated  This chart was scribed in my presence and reviewed by me personally.    ICD-9-CM ICD-10-CM   1. Left-sided low back pain without sciatica 724.2 M54.5  oxyCODONE-acetaminophen (PERCOCET) 10-325 MG per tablet  2. Essential hypertension 401.9 I10 losartan-hydrochlorothiazide (HYZAAR) 50-12.5 MG per tablet  3. Low back pain, unspecified back pain laterality, with sciatica presence unspecified 724.2 M54.5 cyclobenzaprine (FLEXERIL) 10 MG tablet  4. Generalized anxiety disorder 300.02 F41.1 alprazolam (XANAX) 2 MG tablet  5. Acute bronchitis, unspecified organism 466.0 J20.9 azithromycin (ZITHROMAX Z-PAK) 250 MG tablet  6. Hormone receptor positive breast cancer, unspecified laterality 174.9 C50.919 MM Digital Screening   V86.0 Z17.1   7. FH: colon cancer V16.0 Z80.0 Ambulatory referral to Gastroenterology     Signed, Robyn Haber, MD

## 2014-12-07 NOTE — Patient Instructions (Signed)
Let me know if the coughing persists

## 2014-12-07 NOTE — Addendum Note (Signed)
Addended by: Robyn Haber on: 12/07/2014 04:09 PM   Modules accepted: Orders

## 2014-12-10 ENCOUNTER — Ambulatory Visit: Admitting: Family Medicine

## 2015-03-01 ENCOUNTER — Encounter: Payer: Self-pay | Admitting: Genetic Counselor

## 2015-03-01 DIAGNOSIS — Z1379 Encounter for other screening for genetic and chromosomal anomalies: Secondary | ICD-10-CM | POA: Insufficient documentation

## 2015-04-11 ENCOUNTER — Ambulatory Visit (INDEPENDENT_AMBULATORY_CARE_PROVIDER_SITE_OTHER): Admitting: Physician Assistant

## 2015-04-11 VITALS — BP 165/96 | HR 91 | Temp 98.3°F | Resp 20 | Ht 68.5 in | Wt 208.6 lb

## 2015-04-11 DIAGNOSIS — R0789 Other chest pain: Secondary | ICD-10-CM

## 2015-04-11 NOTE — Progress Notes (Signed)
Madison West  MRN: [id number] DOB: 31-Oct-1955  Subjective:  Pt presents to clinic with about 4 hours of chest pain.  Chest pain that abruptively started without any cause.  Constant pain that has not really gotten better. Slightly worse with movement.   No diaphoresis or vomiting or SOB.  Chilled today which is unusual for her. Had a HA today before this started and that is unusual for her.  She does not remember doing anything strenuous today - on Tuesday she had to move a full soda tank and that was heavy and uncomfortable for her.  Recently increased stressed - no day off for the last 12 days 9 years anniversary of husband deaths and planning their annual camping trip to celebrate his life and she is not ready because she has been working so much.  She has had panic attacks in the past and this does not feel like that.  She did take a small part of a Xanax tonight and thinks it may have helped just a little but.  Has had increase in heartburn recently - she has increased her TUMs usage - she has tried Nurse, adult for this but it has not helped like it normally does  Patient Active Problem List   Diagnosis Date Noted  . Genetic testing 03/01/2015  . Breast cancer of lower-outer quadrant of right female breast ([place]) 05/25/2013  . Sciatica of right side 10/19/2012  . CRPS (complex regional pain syndrome), upper limb 10/19/2012  . History of DVT (deep vein thrombosis) 10/05/2011  . Cellulitis of breast 10/04/2011  . Hypertension 10/04/2011  . Anxiety 10/04/2011  . Depression 10/04/2011  . S/P breast lumpectomy     Current Outpatient Prescriptions on File Prior to Visit  Medication Sig Dispense Refill  . alprazolam (XANAX) 2 MG tablet Take 1 tablet (2 mg total) by mouth 3 (three) times daily as needed for sleep. 90 tablet 5  . Calcium Carbonate-Vitamin D (CALCIUM + D PO) Take 600 mg by mouth daily.      . cyclobenzaprine (FLEXERIL) 10 MG tablet Take 1 tablet (10 mg total) by mouth  at bedtime. 30 tablet 11  . losartan-hydrochlorothiazide (HYZAAR) 50-12.5 MG per tablet Take 1 tablet by mouth daily. 90 tablet 3  . oxyCODONE-acetaminophen (PERCOCET) 10-325 MG per tablet Take 1 tablet by mouth as needed for pain. To take one tablet once or twice daily as needed for back and hip pain. 50 tablet 0   No current facility-administered medications on file prior to visit.    Allergies  Allergen Reactions  . Vicodin [Hydrocodone-Acetaminophen] Anxiety    Extreme anxiety.    Review of Systems Objective:  BP 165/96 mmHg  Pulse 91  Temp(Src) 98.3 F (36.8 C) (Oral)  Resp 20  Ht 5' 8.5" (1.74 m)  Wt 208 lb 9.6 oz (94.62 kg)  BMI 31.25 kg/m2  SpO2 98%  Physical Exam  Constitutional: She is oriented to person, place, and time and well-developed, well-nourished, and in no distress.  HENT:  Head: Normocephalic and atraumatic.  Right Ear: Hearing and external ear normal.  Left Ear: Hearing and external ear normal.  Eyes: Conjunctivae are normal.  Neck: Normal range of motion.  Cardiovascular: Normal rate and regular rhythm.   Murmur heard.  Systolic murmur is present with a grade of 1/6  Pulmonary/Chest: Effort normal and breath sounds normal.  Musculoskeletal:       Right lower leg: She exhibits no edema.       Left  lower leg: She exhibits no edema.  Neurological: She is alert and oriented to person, place, and time. Gait normal.  Skin: Skin is warm and dry.  Psychiatric: Mood, memory, affect and judgment normal.  Vitals reviewed.  EKG - 1st degree block, nonspecific T wave in lead III Assessment and Plan :  Other chest pain - Plan: EKG 12-Lead  Nsaid  Windell Hummingbird PA-C  [facility] 04/12/2015 9:39 AM

## 2015-06-06 ENCOUNTER — Other Ambulatory Visit: Payer: Self-pay | Admitting: Gastroenterology

## 2015-07-03 ENCOUNTER — Ambulatory Visit (INDEPENDENT_AMBULATORY_CARE_PROVIDER_SITE_OTHER): Admitting: Family Medicine

## 2015-07-03 VITALS — BP 140/80 | HR 91 | Temp 98.0°F | Resp 16 | Ht 68.5 in | Wt 206.0 lb

## 2015-07-03 DIAGNOSIS — J029 Acute pharyngitis, unspecified: Secondary | ICD-10-CM | POA: Diagnosis not present

## 2015-07-03 DIAGNOSIS — F411 Generalized anxiety disorder: Secondary | ICD-10-CM

## 2015-07-03 DIAGNOSIS — G894 Chronic pain syndrome: Secondary | ICD-10-CM | POA: Diagnosis not present

## 2015-07-03 DIAGNOSIS — M545 Low back pain, unspecified: Secondary | ICD-10-CM

## 2015-07-03 DIAGNOSIS — I10 Essential (primary) hypertension: Secondary | ICD-10-CM | POA: Diagnosis not present

## 2015-07-03 MED ORDER — CYCLOBENZAPRINE HCL 10 MG PO TABS: 10.0000 mg | ORAL_TABLET | Freq: Every day | ORAL | Status: DC

## 2015-07-03 MED ORDER — OXYCODONE-ACETAMINOPHEN 10-325 MG PO TABS: 1.0000 | ORAL_TABLET | ORAL | Status: DC | PRN

## 2015-07-03 MED ORDER — ALPRAZOLAM 2 MG PO TABS: 2.0000 mg | ORAL_TABLET | Freq: Three times a day (TID) | ORAL | Status: DC | PRN

## 2015-07-03 MED ORDER — LOSARTAN POTASSIUM-HCTZ 50-12.5 MG PO TABS: 1.0000 | ORAL_TABLET | Freq: Every day | ORAL | Status: DC

## 2015-07-03 MED ORDER — AMOXICILLIN 500 MG PO CAPS: 500.0000 mg | ORAL_CAPSULE | Freq: Three times a day (TID) | ORAL | Status: DC

## 2015-07-03 NOTE — Progress Notes (Signed)
Subjective:    Patient ID: Madison West, female    DOB: [birth year], 60 y.o.   MRN: [id number] By signing my name below, I, Judithe Modest, attest that this documentation has been prepared under the direction and in the presence of Robyn Haber, MD. Electronically Signed: Judithe Modest, ER Scribe. 07/03/2015. 5:13 PM.  Chief Complaint  Patient presents with   Fever    Onset 4 days ago/ one day 101   Sore Throat    Onset 6 days   Cough    Onset 5 days    HPI HPI Comments: Madison West is a 60 y.o. female who presents to [facility] complaining of fever, sore throat, and cough. She states her sore throat started six days ago, immediately followed by a cough five days ago, and then she developed a fever four days ago. Her fever resolved within 36 hours. She started having chest congestion over the last couple of days. She has NKDA.  Current Outpatient Prescriptions on File Prior to Visit  Medication Sig Dispense Refill   alprazolam (XANAX) 2 MG tablet Take 1 tablet (2 mg total) by mouth 3 (three) times daily as needed for sleep. 90 tablet 5   Calcium Carbonate-Vitamin D (CALCIUM + D PO) Take 600 mg by mouth daily.       cyclobenzaprine (FLEXERIL) 10 MG tablet Take 1 tablet (10 mg total) by mouth at bedtime. 30 tablet 11   losartan-hydrochlorothiazide (HYZAAR) 50-12.5 MG per tablet Take 1 tablet by mouth daily. 90 tablet 3   oxyCODONE-acetaminophen (PERCOCET) 10-325 MG per tablet Take 1 tablet by mouth as needed for pain. To take one tablet once or twice daily as needed for back and hip pain. 50 tablet 0   No current facility-administered medications on file prior to visit.   Past Medical History  Diagnosis Date   Anxiety    Depression    Hypertension    Breast cancer ([place]) 06/02/07    r breast   S/P breast lumpectomy 07/04/07    R breast   S/P radiation therapy 2009   Colon polyps     Review of Systems  Constitutional: Negative for fever and chills.    HENT: Positive for congestion, rhinorrhea and sore throat. Negative for sinus pressure.   Respiratory: Positive for cough. Negative for shortness of breath.       Objective:  BP 140/80 mmHg   Pulse 91   Temp(Src) 98 F (36.7 C) (Oral)   Resp 16   Ht 5' 8.5" (1.74 m)   Wt 206 lb (93.441 kg)   BMI 30.86 kg/m2   SpO2 97%  Physical Exam  Constitutional: She is oriented to person, place, and time. She appears well-developed and well-nourished. No distress.  HENT:  Head: Normocephalic and atraumatic.  Bilateral TMs clear.  Eyes: Pupils are equal, round, and reactive to light.  Neck: Neck supple.  Cardiovascular: Normal rate.   Pulmonary/Chest: Effort normal. No respiratory distress.  Musculoskeletal: Normal range of motion.  Neurological: She is alert and oriented to person, place, and time. Coordination normal.  Skin: Skin is warm and dry. She is not diaphoretic.  Psychiatric: She has a normal mood and affect. Her behavior is normal.  Nursing note and vitals reviewed.     Assessment & Plan:   This chart was scribed in my presence and reviewed by me personally.    ICD-9-CM ICD-10-CM   1. Pharyngitis 462 J02.9 amoxicillin (AMOXIL) 500 MG capsule  2. Chronic  pain syndrome 338.4 G89.4   3. Generalized anxiety disorder 300.02 F41.1 alprazolam (XANAX) 2 MG tablet  4. Low back pain, unspecified back pain laterality, with sciatica presence unspecified 724.2 M54.5 cyclobenzaprine (FLEXERIL) 10 MG tablet  5. Essential hypertension 401.9 I10 losartan-hydrochlorothiazide (HYZAAR) 50-12.5 MG tablet  6. Left-sided low back pain without sciatica 724.2 M54.5 oxyCODONE-acetaminophen (PERCOCET) 10-325 MG tablet     Signed, Robyn Haber, MD

## 2015-08-05 ENCOUNTER — Ambulatory Visit (HOSPITAL_COMMUNITY): Admission: RE | Admit: 2015-08-05 | Source: Ambulatory Visit | Admitting: Gastroenterology

## 2015-08-05 ENCOUNTER — Encounter (HOSPITAL_COMMUNITY): Admission: RE | Payer: Self-pay | Source: Ambulatory Visit

## 2015-08-05 SURGERY — COLONOSCOPY WITH PROPOFOL
Anesthesia: Monitor Anesthesia Care

## 2016-01-17 ENCOUNTER — Ambulatory Visit (INDEPENDENT_AMBULATORY_CARE_PROVIDER_SITE_OTHER): Admitting: Physician Assistant

## 2016-01-17 VITALS — BP 128/88 | HR 112 | Temp 98.7°F | Resp 18 | Ht 68.5 in | Wt 204.0 lb

## 2016-01-17 DIAGNOSIS — F411 Generalized anxiety disorder: Secondary | ICD-10-CM

## 2016-01-17 DIAGNOSIS — Z1159 Encounter for screening for other viral diseases: Secondary | ICD-10-CM

## 2016-01-17 DIAGNOSIS — I10 Essential (primary) hypertension: Secondary | ICD-10-CM

## 2016-01-17 DIAGNOSIS — M545 Low back pain, unspecified: Secondary | ICD-10-CM

## 2016-01-17 MED ORDER — ALPRAZOLAM 2 MG PO TABS: 1.0000 mg | ORAL_TABLET | Freq: Three times a day (TID) | ORAL | 0 refills | Status: DC | PRN

## 2016-01-17 MED ORDER — OXYCODONE-ACETAMINOPHEN 10-325 MG PO TABS: 1.0000 | ORAL_TABLET | Freq: Every day | ORAL | 0 refills | Status: DC | PRN

## 2016-01-17 MED ORDER — LOSARTAN POTASSIUM-HCTZ 50-12.5 MG PO TABS
1.0000 | ORAL_TABLET | Freq: Every day | ORAL | 3 refills | Status: DC
Start: 2016-01-17 — End: 2016-08-25

## 2016-01-17 NOTE — Progress Notes (Signed)
Madison West  MRN: [id number] DOB: Mar 23, 1956  Subjective:  Pt presents to clinic for medication refills.  She has been seeing Dr Joseph Art for her chronic medications.  She has been on Xanax for years - she takes 1mg  tid-qid even though her Rx is for 2mg  tid.  She finds that this helps her calm down.  She has not been on anything else for her anxiety.  She started this after her diagnosis of breast cancer weeks after her husband died and she has been on it since.  She uses her Percocet 1-2x/week for her back and right hip pain that she has had for years.  She has Flexeril but she does not use that very much.  She has not PT.  She finds that she has more pain when she does more activity.  She does not use any NSAIDs.  Needs refill on her BP meds.  1-2x/week Xanax 2mg  tid  Current Percocet use - #50 has lasted since 06/2015 - she gets it filled at [facility] - this is consistent with the [place] drug database  Dr Margot Ables - therapist  Review of Systems  Constitutional: Negative for chills and fever.  Respiratory: Negative for shortness of breath.   Cardiovascular: Negative for chest pain, palpitations and leg swelling.  Musculoskeletal: Positive for back pain (intermittent).  Psychiatric/Behavioral: The patient is nervous/anxious.     Patient Active Problem List   Diagnosis Date Noted  . Genetic testing 03/01/2015  . Breast cancer of lower-outer quadrant of right female breast ([place]) 05/25/2013  . Sciatica of right side 10/19/2012  . CRPS (complex regional pain syndrome), upper limb 10/19/2012  . History of DVT (deep vein thrombosis) 10/05/2011  . Hypertension 10/04/2011  . Anxiety 10/04/2011  . Depression 10/04/2011  . Personal history of vulvar dysplasia 08/15/2011  . 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 S/P breast lumpectomy     Current Outpatient Prescriptions on File Prior to Visit  Medication Sig Dispense Refill  . Calcium Carbonate-Vitamin D (CALCIUM + D PO)  Take 600 mg by mouth daily.      . cyclobenzaprine (FLEXERIL) 10 MG tablet Take 1 tablet (10 mg total) by mouth at bedtime. 30 tablet 11   No current facility-administered medications on file prior to visit.     Allergies  Allergen Reactions  . Prednisone Other (See Comments)  . Fentanyl Other (See Comments)    MAKES SUPER HYPER PER PT  . Midazolam Anxiety  . Vicodin [Hydrocodone-Acetaminophen] Anxiety    Extreme anxiety.    Objective:  BP 128/88   Pulse (!) 112   Temp 98.7 F (37.1 C) (Oral)   Resp 18   Ht 5' 8.5" (1.74 m)   Wt 204 lb (92.5 kg)   SpO2 95%   BMI 30.57 kg/m   Physical Exam  Constitutional: She is oriented to person, place, and time and well-developed, well-nourished, and in no distress.  HENT:  Head: Normocephalic and atraumatic.  Right Ear: Hearing and external ear normal.  Left Ear: Hearing and external ear normal.  Eyes: Conjunctivae are normal.  Neck: Normal range of motion.  Cardiovascular: Normal rate, regular rhythm and normal heart sounds.   No murmur heard. Pulmonary/Chest: Effort normal and breath sounds normal.  Musculoskeletal:       Right lower leg: She exhibits no edema.       Left lower leg: She exhibits no edema.  Neurological: She is alert and oriented to person, place, and time. Gait normal.  Skin: Skin is warm and dry.  Psychiatric: Mood, memory, affect and judgment normal.  Vitals reviewed.  Spent 35 mins with patient with >50% in counseling and discussion of medications. Assessment and Plan :  Need for hepatitis C screening test - Plan: Hepatitis C antibody - check lab today  Generalized anxiety disorder - Plan: alprazolam (XANAX) 2 MG tablet - d/w pt that this is not something that I am willing to continue to Rx without and daily SSRI treatment - she should get back into therapy -  We will write the dose as she states she takes it monthly which is 2 pills a day - she will continue to try to decrease her dose as she does not want  to start any new medication for her anxiety - we discussed SSRI and how they work - I discussed with her that I am not willing to continue to just write Xanax that she will either need to find a new provider in the next 6 months that is willing to do this or we will to start a long term daily anxiety medication.  I will refill for her during this time frame monthly - she will need to call for her Rxs.  The [place] drug database has no medication refills for Xanax because she uses [facility] for her medication.  Left-sided low back pain without sciatica - Plan: oxyCODONE-acetaminophen (PERCOCET) 10-325 MG tablet -   Essential hypertension - Plan: COMPLETE METABOLIC PANEL WITH GFR, losartan-hydrochlorothiazide (HYZAAR) 50-12.5 MG tablet   D/w pt controlled substance and the CDC guidelines - she does not take percocet daily but she does need to be aware of the possible reaction between the 2 medications - she was encouraged to use more flexeril and NSAIDs when her pain starts vs going directly to the Percocet. She should only use about 10 pills a month so this Rx should last for 2 months.  Windell Hummingbird PA-C  [facility] 01/17/2016 5:13 PM

## 2016-01-17 NOTE — Patient Instructions (Addendum)
  [facility] Policy for Prescribing Controlled Substances (Revised 04/2012) 1. Prescriptions for controlled substances will be filled by ONE provider at [facility] with whom you have established and developed a plan for your care, including follow-up. 2. You are encouraged to schedule an appointment with your prescriber at our appointment center for follow-up visits whenever possible. 3. If you request a prescription for the controlled substance while at [facility] for an acute problem (with someone other than your regular prescriber), you MAY be given a ONE-TIME prescription for a 30-day supply of the controlled substance, to allow time for you to return to see your regular prescriber for additional prescriptions.    IF you received an x-ray today, you will receive an invoice from [facility]. Please contact [facility] at [phone] with questions or concerns regarding your invoice.   IF you received labwork today, you will receive an invoice from [facility] [employer]. Please contact [facility] at [phone] with questions or concerns regarding your invoice.   Our billing staff will not be able to assist you with questions regarding bills from these companies.  You will be contacted with the lab results as soon as they are available. The fastest way to get your results is to activate your My Chart account. Instructions are located on the last page of this paperwork. If you have not heard from [place] regarding the results in 2 weeks, please contact this office.

## 2016-01-17 NOTE — Progress Notes (Signed)
I faxed losartan and alprazolam Rxs to [facility] as requested.

## 2016-01-18 ENCOUNTER — Encounter: Payer: Self-pay | Admitting: Physician Assistant

## 2016-01-18 LAB — COMPLETE METABOLIC PANEL WITH GFR
ALBUMIN: 4.6 g/dL (ref 3.6–5.1)
ALT: 19 U/L (ref 6–29)
AST: 19 U/L (ref 10–35)
Alkaline Phosphatase: 58 U/L (ref 33–130)
BILIRUBIN TOTAL: 0.7 mg/dL (ref 0.2–1.2)
BUN: 10 mg/dL (ref 7–25)
CALCIUM: 10 mg/dL (ref 8.6–10.4)
CO2: 26 mmol/L (ref 20–31)
CREATININE: 0.75 mg/dL (ref 0.50–0.99)
Chloride: 100 mmol/L (ref 98–110)
GFR, Est Non African American: 86 mL/min (ref >=60)
Glucose, Bld: 89 mg/dL (ref 65–99)
Potassium: 4.4 mmol/L (ref 3.5–5.3)
Sodium: 137 mmol/L (ref 135–146)
TOTAL PROTEIN: 7.3 g/dL (ref 6.1–8.1)

## 2016-01-18 LAB — HEPATITIS C ANTIBODY: HCV AB: NEGATIVE

## 2016-01-27 ENCOUNTER — Telehealth: Payer: Self-pay

## 2016-01-27 ENCOUNTER — Other Ambulatory Visit: Payer: Self-pay | Admitting: Physician Assistant

## 2016-01-27 DIAGNOSIS — F411 Generalized anxiety disorder: Secondary | ICD-10-CM

## 2016-01-27 MED ORDER — ALPRAZOLAM 2 MG PO TABS: 1.0000 mg | ORAL_TABLET | Freq: Three times a day (TID) | ORAL | 0 refills | Status: DC | PRN

## 2016-01-27 NOTE — Progress Notes (Signed)
Patient notified

## 2016-01-27 NOTE — Telephone Encounter (Signed)
Sarah   Patient cannot get her alprazolam Duanne Moron) 2 MG tablet through mail order for 21 days.    She would like it sent to [facility] on [address].

## 2016-01-27 NOTE — Progress Notes (Signed)
Refilling for 14 days, while she awaits prescription mailed.   Please advise

## 2016-01-30 NOTE — Telephone Encounter (Signed)
7/28 was faxed to [facility] was written on 8/7  Can we make sure the one sent to [place] was canceled through [facility] -   Or help me figure out what is going on

## 2016-02-04 ENCOUNTER — Ambulatory Visit (INDEPENDENT_AMBULATORY_CARE_PROVIDER_SITE_OTHER): Admitting: Family Medicine

## 2016-02-04 ENCOUNTER — Encounter: Payer: Self-pay | Admitting: Family Medicine

## 2016-02-04 DIAGNOSIS — M545 Low back pain, unspecified: Secondary | ICD-10-CM

## 2016-02-04 DIAGNOSIS — F411 Generalized anxiety disorder: Secondary | ICD-10-CM

## 2016-02-04 MED ORDER — OXYCODONE-ACETAMINOPHEN 10-325 MG PO TABS: 1.0000 | ORAL_TABLET | Freq: Every day | ORAL | 0 refills | Status: DC | PRN

## 2016-02-04 MED ORDER — ALPRAZOLAM 2 MG PO TABS: 1.0000 mg | ORAL_TABLET | Freq: Three times a day (TID) | ORAL | 5 refills | Status: DC | PRN

## 2016-02-04 NOTE — Progress Notes (Signed)
60 yo woman with PTSD and anxiety with depression who needs a refill on her Xanax 2 mg.  She had to taper the prescription because she was running out over the summer.  She is working at the [employer] as a [employer] as the [employer] job fell through.  Husband died 45 years ago.  She is being followed for vulvar skin cancer by her gynecologist.  Objective:  BP 136/84 (BP Location: Right Arm, Patient Position: Sitting, Cuff Size: Normal)   Pulse 96   Temp 98.1 F (36.7 C) (Oral)   Resp 16   Ht 5' 8.75" (1.746 m)   Wt 208 lb (94.3 kg)   SpO2 97%   BMI 30.94 kg/m   We discussed her use of Xanax and patient will continue to work on weaning.   I agreed to refill the Xanax for now and she will seek another provider.  Minie also takes oxycodone for intermittent severe pain.  We discussed her anxiety and pain medicine needs and she realizes that these have significant side effects.  Assessment:  Chronic anxiety from PTSD and chronic back pain.  Plan: Generalized anxiety disorder - Plan: alprazolam (XANAX) 2 MG tablet Generalized anxiety disorder - Plan: alprazolam (XANAX) 2 MG tablet  Left-sided low back pain without sciatica - Plan: oxyCODONE-acetaminophen (PERCOCET) 10-325 MG tablet   Robyn Haber, MD

## 2016-02-04 NOTE — Progress Notes (Signed)
Faxed pt's alprazolam Rx to [facility] as requested.

## 2016-02-05 ENCOUNTER — Encounter: Payer: Self-pay | Admitting: Oncology

## 2016-04-11 ENCOUNTER — Telehealth: Payer: Self-pay

## 2016-04-11 NOTE — Telephone Encounter (Signed)
Call from [facility] regarding dosage of patient.  Rose and I reviewed documentation and verified this for her.

## 2016-04-21 ENCOUNTER — Other Ambulatory Visit: Payer: Self-pay | Admitting: Gastroenterology

## 2016-04-21 ENCOUNTER — Encounter (HOSPITAL_COMMUNITY): Payer: Self-pay | Admitting: *Deleted

## 2016-04-27 ENCOUNTER — Ambulatory Visit (HOSPITAL_COMMUNITY): Admitting: Certified Registered Nurse Anesthetist

## 2016-04-27 ENCOUNTER — Encounter (HOSPITAL_COMMUNITY)
Admission: RE | Disposition: A | Discharge status: Home / Self Care | Payer: Self-pay | Source: Ambulatory Visit | Attending: Gastroenterology

## 2016-04-27 ENCOUNTER — Ambulatory Visit (HOSPITAL_COMMUNITY)
Admission: RE | Admit: 2016-04-27 | Discharge: 2016-04-27 | Disposition: A | Discharge status: Home / Self Care | Source: Ambulatory Visit | Attending: Gastroenterology | Admitting: Gastroenterology

## 2016-04-27 ENCOUNTER — Encounter (HOSPITAL_COMMUNITY): Payer: Self-pay

## 2016-04-27 DIAGNOSIS — K5730 Diverticulosis of large intestine without perforation or abscess without bleeding: Secondary | ICD-10-CM | POA: Diagnosis not present

## 2016-04-27 DIAGNOSIS — D122 Benign neoplasm of ascending colon: Secondary | ICD-10-CM | POA: Diagnosis not present

## 2016-04-27 DIAGNOSIS — Z86010 Personal history of colonic polyps: Secondary | ICD-10-CM | POA: Diagnosis not present

## 2016-04-27 DIAGNOSIS — Z87891 Personal history of nicotine dependence: Secondary | ICD-10-CM | POA: Insufficient documentation

## 2016-04-27 DIAGNOSIS — Z800 Family history of malignant neoplasm of digestive organs: Secondary | ICD-10-CM | POA: Diagnosis not present

## 2016-04-27 DIAGNOSIS — Z1211 Encounter for screening for malignant neoplasm of colon: Secondary | ICD-10-CM | POA: Diagnosis present

## 2016-04-27 DIAGNOSIS — I10 Essential (primary) hypertension: Secondary | ICD-10-CM | POA: Insufficient documentation

## 2016-04-27 HISTORY — PX: COLONOSCOPY WITH PROPOFOL: SHX5780

## 2016-04-27 HISTORY — DX: Carpal tunnel syndrome, unspecified upper limb: G56.00

## 2016-04-27 HISTORY — DX: Dysplasia of vulva, unspecified: N90.3

## 2016-04-27 HISTORY — DX: Unspecified osteoarthritis, unspecified site: M19.90

## 2016-04-27 HISTORY — DX: Adverse effect of unspecified anesthetic, initial encounter: T41.45XA

## 2016-04-27 HISTORY — DX: Other complications of anesthesia, initial encounter: T88.59XA

## 2016-04-27 SURGERY — COLONOSCOPY WITH PROPOFOL
Anesthesia: Monitor Anesthesia Care

## 2016-04-27 MED ORDER — LIDOCAINE 2% (20 MG/ML) 5 ML SYRINGE
INTRAMUSCULAR | Status: AC
  Filled 2016-04-27: qty 5

## 2016-04-27 MED ORDER — LACTATED RINGERS IV SOLN
INTRAVENOUS | Status: DC
  Administered 2016-04-27: 11:00:00 via INTRAVENOUS

## 2016-04-27 MED ORDER — LIDOCAINE 2% (20 MG/ML) 5 ML SYRINGE
INTRAMUSCULAR | Status: DC | PRN
  Administered 2016-04-27: 50 mg via INTRAVENOUS

## 2016-04-27 MED ORDER — PROPOFOL 10 MG/ML IV BOLUS
INTRAVENOUS | Status: AC
  Filled 2016-04-27: qty 20

## 2016-04-27 MED ORDER — PROPOFOL 500 MG/50ML IV EMUL
INTRAVENOUS | Status: DC | PRN
  Administered 2016-04-27: 150 ug/kg/min via INTRAVENOUS

## 2016-04-27 MED ORDER — SODIUM CHLORIDE 0.9 % IV SOLN: INTRAVENOUS | Status: DC

## 2016-04-27 MED ORDER — PHENYLEPHRINE 40 MCG/ML (10ML) SYRINGE FOR IV PUSH (FOR BLOOD PRESSURE SUPPORT)
PREFILLED_SYRINGE | INTRAVENOUS | Status: DC | PRN
  Administered 2016-04-27 (×2): 40 ug via INTRAVENOUS

## 2016-04-27 MED ORDER — PROPOFOL 10 MG/ML IV BOLUS
INTRAVENOUS | Status: AC
  Filled 2016-04-27: qty 40

## 2016-04-27 MED ORDER — PROPOFOL 10 MG/ML IV BOLUS
INTRAVENOUS | Status: DC | PRN
  Administered 2016-04-27: 60 mg via INTRAVENOUS
  Administered 2016-04-27: 10 mg via INTRAVENOUS
  Administered 2016-04-27: 20 mg via INTRAVENOUS

## 2016-04-27 MED ORDER — PHENYLEPHRINE 40 MCG/ML (10ML) SYRINGE FOR IV PUSH (FOR BLOOD PRESSURE SUPPORT)
PREFILLED_SYRINGE | INTRAVENOUS | Status: AC
  Filled 2016-04-27: qty 10

## 2016-04-27 SURGICAL SUPPLY — 21 items

## 2016-04-27 NOTE — Transfer of Care (Signed)
Immediate Anesthesia Transfer of Care Note  Patient: DAJA SCANLON  Procedure(s) Performed: Procedure(s): COLONOSCOPY WITH PROPOFOL (N/A)  Patient Location: PACU  Anesthesia Type:MAC  Level of Consciousness: Patient easily awoken, sedated, comfortable, cooperative, following commands, responds to stimulation.   Airway & Oxygen Therapy: Patient spontaneously breathing, ventilating well, oxygen via simple oxygen mask.  Post-op Assessment: Report given to PACU RN, vital signs reviewed and stable, moving all extremities.   Post vital signs: Reviewed and stable.  Complications: No apparent anesthesia complications  Last Vitals:  Vitals:   04/27/16 1109  BP: (!) 159/82  Pulse: 87  Resp: 13  Temp: 36.9 C    Last Pain:  Vitals:   04/27/16 1109  TempSrc: Oral         Complications: No apparent anesthesia complications

## 2016-04-27 NOTE — Discharge Instructions (Signed)

## 2016-04-27 NOTE — H&P (Signed)
Procedure: Surveillance colonoscopy. Normal surveillance colonoscopies were performed in January 2011 and August 2007. 10/25/2003 colonoscopy was performed with removal of a 5 mm rectal adenomatous polyp, 5 mm cecal adenomatous polyp, and 10 mm adenomatous cecal polyp. Mother was diagnosed with colon cancer under age 62. Brother was diagnosed with a colon polyp which will require surgical removal.  History: The patient is a 60 year old female born 28-Jul-1955. She is scheduled to undergo a surveillance colonoscopy today.  Medication allergies: None  Past medical history: Breast cancer. Right breast lumpectomy.  Exam: The patient is alert and lying comfortably on the endoscopy stretcher. Abdomen is soft and nontender to palpation. Lungs are clear to auscultation. Cardiac exam reveals a regular rhythm.  Plan: Proceed with surveillance colonoscopy

## 2016-04-27 NOTE — Op Note (Signed)
[facility] Patient Name: Madison West Procedure Date: 04/27/2016 MRN: [id number] Attending MD: Garlan Fair , MD Date of Birth: 01/11/1956 CSN: [id number] Age: 60 Admit Type: Outpatient Procedure:                Colonoscopy Indications:              High risk colon cancer surveillance: Personal                            history of non-advanced adenoma. Mother was                            diagnosed with colon cancer under age 79. Providers:                Garlan Fair, MD, Vista Lawman, RN, [facility], Technician, Heide Scales, CRNA Referring MD:              Medicines:                Propofol per Anesthesia Complications:            No immediate complications. Estimated Blood Loss:     Estimated blood loss: none. Procedure:                Pre-Anesthesia Assessment:                           - Prior to the procedure, a History and Physical                            was performed, and patient medications and                            allergies were reviewed. The patient's tolerance of                            previous anesthesia was also reviewed. The risks                            and benefits of the procedure and the sedation                            options and risks were discussed with the patient.                            All questions were answered, and informed consent                            was obtained. Prior Anticoagulants: The patient has                            taken no previous anticoagulant or antiplatelet  agents. ASA Grade Assessment: II - A patient with                            mild systemic disease. After reviewing the risks                            and benefits, the patient was deemed in                            satisfactory condition to undergo the procedure.                           After obtaining informed consent, the colonoscope          was passed under direct vision. Throughout the                            procedure, the patient's blood pressure, pulse, and                            oxygen saturations were monitored continuously. The                            [id number]) scope was introduced through                            the anus and advanced to the the cecum, identified                            by appendiceal orifice and ileocecal valve. The                            colonoscopy was performed without difficulty. The                            patient tolerated the procedure well. The quality                            of the bowel preparation was good. The appendiceal                            orifice and the rectum were photographed. Scope In: 11:24:32 AM Scope Out: 11:49:27 AM Scope Withdrawal Time: 0 hours 19 minutes 36 seconds  Total Procedure Duration: 0 hours 24 minutes 55 seconds  Findings:      The perianal and digital rectal examinations were normal.      Three sessile polyps were found in the ascending colon. The polyps were       3 mm in size. These polyps were removed with a cold snare. Resection and       retrieval were complete.      Two sessile polyps were found in the ascending colon. The polyps were 5       mm in size. These polyps were removed with a cold snare. Resection and       retrieval were complete.  A 4 mm polyp was found in the sigmoid colon. The polyp was sessile. The       polyp was removed with a cold snare. Resection and retrieval were       complete.      Multiple small and large-mouthed diverticula were found in the sigmoid       colon and ascending colon.      The exam was otherwise without abnormality. Impression:               - Three 3 mm polyps in the ascending colon, removed                            with a cold snare. Resected and retrieved.                           - Two 5 mm polyps in the ascending colon, removed                             with a cold snare. Resected and retrieved.                           - One 4 mm polyp in the sigmoid colon, removed with                            a cold snare. Resected and retrieved.                           - Diverticulosis in the sigmoid colon and in the                            ascending colon.                           - The examination was otherwise normal. Moderate Sedation:      N/A- Per Anesthesia Care Recommendation:           - Patient has a contact number available for                            emergencies. The signs and symptoms of potential                            delayed complications were discussed with the                            patient. Return to normal activities tomorrow.                            Written discharge instructions were provided to the                            patient.                           - Repeat colonoscopy in 5 years for surveillance.                           -  Resume previous diet.                           - Continue present medications. Procedure Code(s):        --- Professional ---                           [phone], Colonoscopy, flexible; with removal of                            tumor(s), polyp(s), or other lesion(s) by snare                            technique Diagnosis Code(s):        --- Professional ---                           Z86.010, Personal history of colonic polyps                           D12.2, Benign neoplasm of ascending colon                           D12.5, Benign neoplasm of sigmoid colon                           K57.30, Diverticulosis of large intestine without                            perforation or abscess without bleeding CPT copyright 2016 American Medical Association. All rights reserved. The codes documented in this report are preliminary and upon coder review may  be revised to meet current compliance requirements. Earle Gell, MD Garlan Fair, MD 04/27/2016 11:58:58 AM This report has  been signed electronically. Number of Addenda: 0

## 2016-04-27 NOTE — Anesthesia Preprocedure Evaluation (Signed)
Anesthesia Evaluation  Patient identified by MRN, date of birth, ID band Patient awake    Reviewed: Allergy & Precautions, NPO status , Patient's Chart, lab work & pertinent test results  Airway Mallampati: I       Dental no notable dental hx.    Pulmonary neg pulmonary ROS, former smoker,    Pulmonary exam normal        Cardiovascular hypertension, negative cardio ROS Normal cardiovascular exam     Neuro/Psych    GI/Hepatic negative GI ROS, Neg liver ROS,   Endo/Other  negative endocrine ROS  Renal/GU negative Renal ROS  negative genitourinary   Musculoskeletal   Abdominal Normal abdominal exam  (+)   Peds negative pediatric ROS (+)  Hematology negative hematology ROS (+)   Anesthesia Other Findings   Reproductive/Obstetrics negative OB ROS                             Anesthesia Physical Anesthesia Plan  ASA: II  Anesthesia Plan: MAC   Post-op Pain Management:    Induction: Intravenous  Airway Management Planned: Simple Face Mask, Natural Airway and Nasal Cannula  Additional Equipment:   Intra-op Plan:   Post-operative Plan:   Informed Consent: I have reviewed the patients History and Physical, chart, labs and discussed the procedure including the risks, benefits and alternatives for the proposed anesthesia with the patient or authorized representative who has indicated his/her understanding and acceptance.     Plan Discussed with: CRNA and Surgeon  Anesthesia Plan Comments:         Anesthesia Quick Evaluation

## 2016-04-27 NOTE — Anesthesia Postprocedure Evaluation (Signed)
Anesthesia Post Note  Patient: Madison West  Procedure(s) Performed: Procedure(s) (LRB): COLONOSCOPY WITH PROPOFOL (N/A)  Patient location during evaluation: PACU Anesthesia Type: MAC Level of consciousness: awake Pain management: pain level controlled Vital Signs Assessment: post-procedure vital signs reviewed and stable Respiratory status: spontaneous breathing Cardiovascular status: stable Postop Assessment: no signs of nausea or vomiting Anesthetic complications: no     Last Vitals:  Vitals:   04/27/16 1210 04/27/16 1220  BP: (!) 146/89 (!) 161/90  Pulse: 81 81  Resp: 18 15  Temp:      Last Pain:  Vitals:   04/27/16 1109  TempSrc: Oral   Pain Goal:                 Aemon Koeller JR,JOHN Hancel Ion

## 2016-04-28 ENCOUNTER — Encounter (HOSPITAL_COMMUNITY): Payer: Self-pay | Admitting: Gastroenterology

## 2016-08-04 ENCOUNTER — Encounter (HOSPITAL_BASED_OUTPATIENT_CLINIC_OR_DEPARTMENT_OTHER): Payer: Self-pay | Admitting: Emergency Medicine

## 2016-08-04 ENCOUNTER — Emergency Department (HOSPITAL_BASED_OUTPATIENT_CLINIC_OR_DEPARTMENT_OTHER)

## 2016-08-04 ENCOUNTER — Emergency Department (HOSPITAL_BASED_OUTPATIENT_CLINIC_OR_DEPARTMENT_OTHER)
Admission: EM | Admit: 2016-08-04 | Discharge: 2016-08-05 | Disposition: A | Discharge status: Home / Self Care | Attending: Emergency Medicine | Admitting: Emergency Medicine

## 2016-08-04 DIAGNOSIS — Z79899 Other long term (current) drug therapy: Secondary | ICD-10-CM | POA: Insufficient documentation

## 2016-08-04 DIAGNOSIS — Z853 Personal history of malignant neoplasm of breast: Secondary | ICD-10-CM | POA: Diagnosis not present

## 2016-08-04 DIAGNOSIS — R002 Palpitations: Secondary | ICD-10-CM | POA: Diagnosis present

## 2016-08-04 DIAGNOSIS — Z87891 Personal history of nicotine dependence: Secondary | ICD-10-CM | POA: Insufficient documentation

## 2016-08-04 DIAGNOSIS — J189 Pneumonia, unspecified organism: Secondary | ICD-10-CM | POA: Insufficient documentation

## 2016-08-04 DIAGNOSIS — I10 Essential (primary) hypertension: Secondary | ICD-10-CM | POA: Insufficient documentation

## 2016-08-04 DIAGNOSIS — I471 Supraventricular tachycardia: Secondary | ICD-10-CM | POA: Insufficient documentation

## 2016-08-04 LAB — CBC WITH DIFFERENTIAL/PLATELET
Basophils Absolute: 0 10*3/uL (ref 0.0–0.1)
Basophils Relative: 0 %
EOS ABS: 0.1 10*3/uL (ref 0.0–0.7)
Eosinophils Relative: 1 %
HEMATOCRIT: 43.2 % (ref 36.0–46.0)
HEMOGLOBIN: 14.9 g/dL (ref 12.0–15.0)
LYMPHS ABS: 2.9 10*3/uL (ref 0.7–4.0)
LYMPHS PCT: 17 %
MCH: 31.9 pg (ref 26.0–34.0)
MCHC: 34.5 g/dL (ref 30.0–36.0)
MCV: 92.5 fL (ref 78.0–100.0)
MONOS PCT: 7 %
Monocytes Absolute: 1.2 10*3/uL — ABNORMAL HIGH (ref 0.1–1.0)
NEUTROS ABS: 12.9 10*3/uL — AB (ref 1.7–7.7)
NEUTROS PCT: 75 %
Platelets: 416 10*3/uL — ABNORMAL HIGH (ref 150–400)
RBC: 4.67 MIL/uL (ref 3.87–5.11)
RDW: 13.5 % (ref 11.5–15.5)
WBC: 17.1 10*3/uL — ABNORMAL HIGH (ref 4.0–10.5)

## 2016-08-04 LAB — COMPREHENSIVE METABOLIC PANEL
ALK PHOS: 62 U/L (ref 38–126)
ALT: 19 U/L (ref 14–54)
ANION GAP: 8 (ref 5–15)
AST: 27 U/L (ref 15–41)
Albumin: 4.1 g/dL (ref 3.5–5.0)
BILIRUBIN TOTAL: 0.9 mg/dL (ref 0.3–1.2)
BUN: 15 mg/dL (ref 6–20)
CALCIUM: 9.2 mg/dL (ref 8.9–10.3)
CO2: 28 mmol/L (ref 22–32)
CREATININE: 0.82 mg/dL (ref 0.44–1.00)
Chloride: 98 mmol/L — ABNORMAL LOW (ref 101–111)
Glucose, Bld: 126 mg/dL — ABNORMAL HIGH (ref 65–99)
Potassium: 3.5 mmol/L (ref 3.5–5.1)
Sodium: 134 mmol/L — ABNORMAL LOW (ref 135–145)
TOTAL PROTEIN: 8.1 g/dL (ref 6.5–8.1)

## 2016-08-04 LAB — URINALYSIS, MICROSCOPIC (REFLEX)

## 2016-08-04 LAB — URINALYSIS, ROUTINE W REFLEX MICROSCOPIC
BILIRUBIN URINE: NEGATIVE
Glucose, UA: NEGATIVE mg/dL
KETONES UR: NEGATIVE mg/dL
LEUKOCYTES UA: NEGATIVE
NITRITE: NEGATIVE
PH: 6 (ref 5.0–8.0)
PROTEIN: NEGATIVE mg/dL
Specific Gravity, Urine: 1.006 (ref 1.005–1.030)

## 2016-08-04 LAB — TROPONIN I: Troponin I: <0.03 ng/mL (ref <0.03)

## 2016-08-04 MED ORDER — ACETAMINOPHEN 325 MG PO TABS
ORAL_TABLET | ORAL | Status: AC
  Filled 2016-08-04: qty 2

## 2016-08-04 MED ORDER — DOXYCYCLINE HYCLATE 100 MG PO TABS
100.0000 mg | ORAL_TABLET | Freq: Once | ORAL | Status: AC
  Administered 2016-08-04: 100 mg via ORAL
  Filled 2016-08-04: qty 1

## 2016-08-04 MED ORDER — SODIUM CHLORIDE 0.9 % IV BOLUS (SEPSIS)
500.0000 mL | Freq: Once | INTRAVENOUS | Status: AC
  Administered 2016-08-04: 500 mL via INTRAVENOUS

## 2016-08-04 MED ORDER — ACETAMINOPHEN 500 MG PO TABS
1000.0000 mg | ORAL_TABLET | Freq: Once | ORAL | Status: AC
  Administered 2016-08-04: 1000 mg via ORAL

## 2016-08-04 MED ORDER — ADENOSINE 6 MG/2ML IV SOLN
INTRAVENOUS | Status: AC
  Filled 2016-08-04: qty 6

## 2016-08-04 MED ORDER — DOXYCYCLINE HYCLATE 100 MG PO CAPS: 100.0000 mg | ORAL_CAPSULE | Freq: Two times a day (BID) | ORAL | 0 refills | Status: AC

## 2016-08-04 MED ORDER — SODIUM CHLORIDE 0.9 % IV BOLUS (SEPSIS)
1000.0000 mL | Freq: Once | INTRAVENOUS | Status: AC
  Administered 2016-08-04: 1000 mL via INTRAVENOUS

## 2016-08-04 MED ORDER — ADENOSINE 6 MG/2ML IV SOLN
6.0000 mg | Freq: Once | INTRAVENOUS | Status: AC
Start: 2016-08-04 — End: 2016-08-04
  Administered 2016-08-04: 6 mg via INTRAVENOUS

## 2016-08-04 NOTE — ED Provider Notes (Signed)
Emergency Department Provider Note  By signing my name below, I, Higinio Plan, attest that this documentation has been prepared under the direction and in the presence of Margette Fast, MD . Electronically Signed: Higinio Plan, Scribe. 08/04/2016. 10:16 PM.  I have reviewed the triage vital signs and the nursing notes.  HISTORY  Chief Complaint Tachycardia and URI  HPI Comments: Madison West is a 60 y.o. female with PMHx of HTN, anxiety, and breast cancer, who presents to the Emergency Department complaining of persistent, sensation of palpitations that began at ~3:00 PM this afternoon. Pt reports she checked her heart rate at ~5:00 PM this evening which read 153. She notes she took her anxiety medication soon after and checked her heart rate ~1 hour PTA which still read 151 so she decided to visit the ED. Pt reports associated congestion, sore throat, postnasal drip, and one episode of dizziness described as room spinning that began ~4 days ago but resolved spontaneously. She denies any other complaints. No exacerbating or alleviating factors.    Past Medical History:  Diagnosis Date  . Anxiety   . Arthritis    low back and hip pain intermittent  . Breast cancer ([place]) 06/02/07   r breast -surgery ,radiaology. chemotherapy  . Carpal tunnel syndrome    right hand  . Colon polyps   . Complication of anesthesia    Fentanyl, Versed-makes extra hyper, bradycardia x 1 episode in past  . Depression   . Dysplasia of vulva   . Hypertension   . S/P breast lumpectomy 07/04/07   R breast  . S/P radiation therapy 2009    Patient Active Problem List   Diagnosis Date Noted  . Genetic testing 03/01/2015  . Breast cancer of lower-outer quadrant of right female breast ([place]) 05/25/2013  . Sciatica of right side 10/19/2012  . CRPS (complex regional pain syndrome), upper limb 10/19/2012  . History of DVT (deep vein thrombosis) 10/05/2011  . Hypertension 10/04/2011  . Anxiety 10/04/2011  .  Depression 10/04/2011  . Personal history of vulvar dysplasia 08/15/2011  . 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 S/P breast lumpectomy     Past Surgical History:  Procedure Laterality Date  . APPENDECTOMY     age 17  . CESAREAN SECTION     x 2  . COLONOSCOPY W/ POLYPECTOMY    . COLONOSCOPY WITH PROPOFOL N/A 04/27/2016   Procedure: COLONOSCOPY WITH PROPOFOL;  Surgeon: Garlan Fair, MD;  Location: [facility];  Service: Endoscopy;  Laterality: N/A;  . DILATION AND CURETTAGE OF UTERUS     multiple  . INTRAUTERINE DEVICE INSERTION    . IUD REMOVAL    . MASTECTOMY PARTIAL / LUMPECTOMY W/ AXILLARY LYMPHADENECTOMY    . TUBAL LIGATION    . VULVA SURGERY     Multiple times for dysplasia    Current Outpatient Rx  . Order #: [id number] Class: Print  . Order #: [id number] Class: Historical Med  . Order #: [id number] Class: Print  . Order #: [id number] Class: Print  . Order #: [id number] Class: Print  . Order #: [id number] Class: Print    Allergies Prednisone; Fentanyl; Midazolam; and Vicodin [hydrocodone-acetaminophen]  Family History  Problem Relation Age of Onset  . Colon cancer Mother 69  . Heart disease Father     heart attack  . Colon polyps Brother     one brother had large colon polyp that needed surgery to be removed  . Heart disease Paternal Grandfather     Social  History Social History  Substance Use Topics  . Smoking status: Former Smoker    Packs/day: 0.75    Years: 20.00    Types: Cigarettes    Quit date: 05/06/1997  . Smokeless tobacco: Never Used  . Alcohol use 0.0 oz/week     Comment: 2 daily    Review of Systems  Constitutional: No fever/chills Eyes: No visual changes. ENT: No sore throat. Cardiovascular: Denies chest pain. Respiratory: Denies shortness of breath. Positive cough.  Gastrointestinal: No abdominal pain.  No nausea, no vomiting.  No diarrhea.  No constipation. Genitourinary: Negative for dysuria. Musculoskeletal: Negative  for back pain. Skin: Negative for rash. Neurological: Negative for headaches, focal weakness or numbness.  10-point ROS otherwise negative.  ____________________________________________   PHYSICAL EXAM:  VITAL SIGNS: ED Triage Vitals  Enc Vitals Group     BP 08/04/16 2147 (!) 164/110     Pulse Rate 08/04/16 2147 (!) 152     Resp 08/04/16 2147 18     Temp 08/04/16 2147 99.5 F (37.5 C)     Temp Source 08/04/16 2147 Oral     SpO2 08/04/16 2147 96 %     Weight 08/04/16 2148 195 lb (88.5 kg)     Height 08/04/16 2148 5\' 9"  (1.753 m)    Constitutional: Alert and oriented. Well appearing and in no acute distress. Eyes: Conjunctivae are normal.  Head: Atraumatic. Nose: No congestion/rhinnorhea. Mouth/Throat: Mucous membranes are moist.  Neck: No stridor.   Cardiovascular: Narrow complex regular SVT. Good peripheral circulation. Grossly normal heart sounds.   Respiratory: Normal respiratory effort.  No retractions. Lungs CTAB. Gastrointestinal: Soft and nontender. No distention.  Musculoskeletal: No lower extremity tenderness nor edema. No gross deformities of extremities. Neurologic:  Normal speech and language. No gross focal neurologic deficits are appreciated.  Skin:  Skin is warm, dry and intact. No rash noted. Psychiatric: Mood and affect are normal. Speech and behavior are normal.  ____________________________________________   LABS (all labs ordered are listed, but only abnormal results are displayed)  Labs Reviewed  CBC WITH DIFFERENTIAL/PLATELET - Abnormal; Notable for the following:       Result Value   WBC 17.1 (*)    Platelets 416 (*)    Neutro Abs 12.9 (*)    Monocytes Absolute 1.2 (*)    All other components within normal limits  COMPREHENSIVE METABOLIC PANEL - Abnormal; Notable for the following:    Sodium 134 (*)    Chloride 98 (*)    Glucose, Bld 126 (*)    All other components within normal limits  URINALYSIS, ROUTINE W REFLEX MICROSCOPIC - Abnormal;  Notable for the following:    Hgb urine dipstick SMALL (*)    All other components within normal limits  URINALYSIS, MICROSCOPIC (REFLEX) - Abnormal; Notable for the following:    Bacteria, UA RARE (*)    Squamous Epithelial / LPF 0-5 (*)    All other components within normal limits  TROPONIN I  TSH   ____________________________________________  EKG   EKG Interpretation  Date/Time:  Tuesday August 04 2016 22:02:25 EST Ventricular Rate:  149 PR Interval:    QRS Duration: 77 QT Interval:  317 QTC Calculation: 500 R Axis:   -38 Text Interpretation:  Junctional tachycardia Left axis deviation Repolarization abnormality, prob rate related Borderline prolonged QT interval No STEMI.  Confirmed by LONG MD, JOSHUA [phone]) on 08/04/2016 10:06:36 PM       ____________________________________________  RADIOLOGY  Dg Chest 2 View  Result Date:  08/04/2016 CLINICAL DATA:  Acute onset of sore throat, cough, congestion and fever. Dizziness. Tachycardia. Initial encounter. EXAM: CHEST  2 VIEW COMPARISON:  Chest radiograph performed 03/10/2009 FINDINGS: The lungs are well-aerated. Mild left basilar opacity may reflect mild pneumonia. There is no evidence of pleural effusion or pneumothorax. The heart is normal in size; the mediastinal contour is within normal limits. No acute osseous abnormalities are seen. External pacing pads are noted. IMPRESSION: Mild left basilar airspace opacity raises concern for mild pneumonia, given the patient's symptoms. Electronically Signed   By: Garald Balding M.D.   On: 08/04/2016 23:14    ____________________________________________   PROCEDURES  Procedure(s) performed:   .Cardioversion Date/Time: 08/04/2016 11:23 PM Performed by: Margette Fast Authorized by: Margette Fast   Consent:    Consent obtained:  Emergent situation and verbal   Consent given by:  Patient   Risks discussed:  Induced arrhythmia, death and pain   Alternatives discussed:   Delayed treatment and observation Pre-procedure details:    Cardioversion basis:  Emergent   Rhythm:  Supraventricular tachycardia   Electrode placement:  Anterior-posterior Attempt one:    Cardioversion mode attempt one: Adenosine 6 mg IVP. Post-procedure details:    Patient status:  Awake (Returned to sinus rhythm. )   Patient tolerance of procedure:  Tolerated well, no immediate complications   CRITICAL CARE Performed by: Margette Fast Total critical care time: 35 minutes Critical care time was exclusive of separately billable procedures and treating other patients. Critical care was necessary to treat or prevent imminent or life-threatening deterioration. Critical care was time spent personally by me on the following activities: development of treatment plan with patient and/or surrogate as well as nursing, discussions with consultants, evaluation of patient's response to treatment, examination of patient, obtaining history from patient or surrogate, ordering and performing treatments and interventions, ordering and review of laboratory studies, ordering and review of radiographic studies, pulse oximetry and re-evaluation of patient's condition.  Nanda Quinton, MD Emergency Medicine  ____________________________________________   INITIAL IMPRESSION / ASSESSMENT AND PLAN / ED COURSE  Pertinent labs & imaging results that were available during my care of the patient were reviewed by me and considered in my medical decision making (see chart for details).  Patient resents to the emergency department for evaluation of cough and tachycardia noticed while taking blood pressure today. Patient isn't narrow complex regular tachycardia with rate of 150. Discussed the risks and benefits of adenosine and obtained verbal consent. Spoke with the patient's daughter by phone. Patient also having significant cough. Will obtain screening labs and CXR after cardioversion.   11:32 PM Patient is  resting comfortably. No SOB or CP. Patient with continued cough and small PNA on CXR. No hypoxemia. No increased WOB. Discussed the case with Cardiology who agrees with plan for brief observation in the ED to ensure that she doesn't return to SVT and have her follow up with Cardiology. Patient has family members who follow with Dr. Nadyne Coombes so will refer there. Doxycycline given in the ED.   At this time, I do not feel there is any life-threatening condition present. I have reviewed and discussed all results (EKG, imaging, lab, urine as appropriate), exam findings with patient. I have reviewed nursing notes and appropriate previous records.  I feel the patient is safe to be discharged home without further emergent workup. Discussed usual and customary return precautions. Patient and family (if present) verbalize understanding and are comfortable with this plan.  Patient will follow-up  with their primary care provider. If they do not have a primary care provider, information for follow-up has been provided to them. All questions have been answered.  ____________________________________________  FINAL CLINICAL IMPRESSION(S) / ED DIAGNOSES  Final diagnoses:  SVT (supraventricular tachycardia) (HCC)  Community acquired pneumonia, unspecified laterality     MEDICATIONS GIVEN DURING THIS VISIT:  Medications  sodium chloride 0.9 % bolus 500 mL (0 mLs Intravenous Stopped 08/04/16 2255)  adenosine (ADENOCARD) 6 MG/2ML injection 6 mg (6 mg Intravenous Given 08/04/16 2238)  doxycycline (VIBRA-TABS) tablet 100 mg (100 mg Oral Given 08/04/16 2342)  sodium chloride 0.9 % bolus 1,000 mL (0 mLs Intravenous Stopped 08/05/16 0032)  acetaminophen (TYLENOL) tablet 1,000 mg (1,000 mg Oral Given 08/04/16 2342)    NEW OUTPATIENT MEDICATIONS STARTED DURING THIS VISIT:  Discharge Medication List as of 08/05/2016 12:30 AM    START taking these medications   Details  doxycycline (VIBRAMYCIN) 100 MG capsule Take 1 capsule  (100 mg total) by mouth 2 (two) times daily., Starting Tue 08/04/2016, Until Fri 08/14/2016, Print       Note:  This document was prepared using Dragon voice recognition software and may include unintentional dictation errors.   I personally performed the services described in this documentation, which was scribed in my presence. The recorded information has been reviewed and is accurate.   Nanda Quinton, MD Emergency Medicine   Margette Fast, MD 08/05/16 [phone]

## 2016-08-04 NOTE — ED Triage Notes (Signed)
Pt reports elevated HR. HR 153 in triage.

## 2016-08-04 NOTE — ED Notes (Signed)
MD on the phone with the daughter, the patient then spoke with daughter as well to reassure both of them that it is ok to give the medication

## 2016-08-04 NOTE — ED Triage Notes (Signed)
Pt also c/o cough and congestion has low grade fever in triage.

## 2016-08-04 NOTE — Discharge Instructions (Signed)
We believe that your symptoms are caused today by pneumonia, an infection in your lung(s).  Fortunately you should start to improve quickly after taking your antibiotics.  Please take the full course of antibiotics as prescribed and drink plenty of fluids.   ° °Follow up with your doctor within 1-2 days.  If you develop any new or worsening symptoms, including but not limited to fever in spite of taking over-the-counter ibuprofen and/or Tylenol, persistent vomiting, worsening shortness of breath, or other symptoms that concern you, please return to the Emergency Department immediately.  ° ° °Pneumonia °Pneumonia is an infection of the lungs.  °CAUSES °Pneumonia may be caused by bacteria or a virus. Usually, these infections are caused by breathing infectious particles into the lungs (respiratory tract). °SIGNS AND SYMPTOMS  °Cough. °Fever. °Chest pain. °Increased rate of breathing. °Wheezing. °Mucus production. °DIAGNOSIS  °If you have the common symptoms of pneumonia, your health care provider will typically confirm the diagnosis with a chest X-ray. The X-ray will show an abnormality in the lung (pulmonary infiltrate) if you have pneumonia. Other tests of your blood, urine, or sputum may be done to find the specific cause of your pneumonia. Your health care provider may also do tests (blood gases or pulse oximetry) to see how well your lungs are working. °TREATMENT  °Some forms of pneumonia may be spread to other people when you cough or sneeze. You may be asked to wear a mask before and during your exam. Pneumonia that is caused by bacteria is treated with antibiotic medicine. Pneumonia that is caused by the influenza virus may be treated with an antiviral medicine. Most other viral infections must run their course. These infections will not respond to antibiotics.  °HOME CARE INSTRUCTIONS  °Cough suppressants may be used if you are losing too much rest. However, coughing protects you by clearing your lungs. You  should avoid using cough suppressants if you can. °Your health care provider may have prescribed medicine if he or she thinks your pneumonia is caused by bacteria or influenza. Finish your medicine even if you start to feel better. °Your health care provider may also prescribe an expectorant. This loosens the mucus to be coughed up. °Take medicines only as directed by your health care provider. °Do not smoke. Smoking is a common cause of bronchitis and can contribute to pneumonia. If you are a smoker and continue to smoke, your cough may last several weeks after your pneumonia has cleared. °A cold steam vaporizer or humidifier in your room or home may help loosen mucus. °Coughing is often worse at night. Sleeping in a semi-upright position in a recliner or using a couple pillows under your head will help with this. °Get rest as you feel it is needed. Your body will usually let you know when you need to rest. °PREVENTION °A pneumococcal shot (vaccine) is available to prevent a common bacterial cause of pneumonia. This is usually suggested for: °People over 65 years old. °Patients on chemotherapy. °People with chronic lung problems, such as bronchitis or emphysema. °People with immune system problems. °If you are over 65 or have a high risk condition, you may receive the pneumococcal vaccine if you have not received it before. In some countries, a routine influenza vaccine is also recommended. This vaccine can help prevent some cases of pneumonia. You may be offered the influenza vaccine as part of your care. °If you smoke, it is time to quit. You may receive instructions on how to stop smoking. Your   health care provider can provide medicines and counseling to help you quit. °SEEK MEDICAL CARE IF: °You have a fever. °SEEK IMMEDIATE MEDICAL CARE IF:  °Your illness becomes worse. This is especially true if you are elderly or weakened from any other disease. °You cannot control your cough with suppressants and are losing  sleep. °You begin coughing up blood. °You develop pain which is getting worse or is uncontrolled with medicines. °Any of the symptoms which initially brought you in for treatment are getting worse rather than better. °You develop shortness of breath or chest pain. °MAKE SURE YOU:  °Understand these instructions. °Will watch your condition. °Will get help right away if you are not doing well or get worse. °Document Released: 06/08/2005 Document Revised: 10/23/2013 Document Reviewed: 08/28/2010 °Exit[employer]® Patient Information ©2015 [employer]. This information is not intended to replace advice given to you by your health care provider. Make sure you discuss any questions you have with your health care provider. ° ° ° °

## 2016-08-04 NOTE — ED Notes (Signed)
Patient is refusing to have medication until daughter is at the bedside. The patient is upset and worried that the medication is going to hurt.

## 2016-08-04 NOTE — ED Notes (Signed)
MD and 2 rns at the bedside. The Code cart at the bedside

## 2016-08-05 LAB — TSH: TSH: 1.611 u[IU]/mL (ref 0.350–4.500)

## 2016-08-25 ENCOUNTER — Ambulatory Visit (INDEPENDENT_AMBULATORY_CARE_PROVIDER_SITE_OTHER): Admitting: Family Medicine

## 2016-08-25 ENCOUNTER — Ambulatory Visit (INDEPENDENT_AMBULATORY_CARE_PROVIDER_SITE_OTHER)

## 2016-08-25 VITALS — BP 110/72 | HR 108 | Temp 98.7°F | Resp 18 | Ht 69.0 in | Wt 206.0 lb

## 2016-08-25 DIAGNOSIS — E871 Hypo-osmolality and hyponatremia: Secondary | ICD-10-CM

## 2016-08-25 DIAGNOSIS — J189 Pneumonia, unspecified organism: Secondary | ICD-10-CM

## 2016-08-25 DIAGNOSIS — I10 Essential (primary) hypertension: Secondary | ICD-10-CM

## 2016-08-25 DIAGNOSIS — M545 Low back pain, unspecified: Secondary | ICD-10-CM

## 2016-08-25 DIAGNOSIS — I471 Supraventricular tachycardia, unspecified: Secondary | ICD-10-CM

## 2016-08-25 DIAGNOSIS — R05 Cough: Secondary | ICD-10-CM | POA: Diagnosis not present

## 2016-08-25 DIAGNOSIS — F411 Generalized anxiety disorder: Secondary | ICD-10-CM

## 2016-08-25 DIAGNOSIS — J181 Lobar pneumonia, unspecified organism: Secondary | ICD-10-CM | POA: Diagnosis not present

## 2016-08-25 DIAGNOSIS — R059 Cough, unspecified: Secondary | ICD-10-CM

## 2016-08-25 DIAGNOSIS — G8929 Other chronic pain: Secondary | ICD-10-CM

## 2016-08-25 LAB — POCT CBC
GRANULOCYTE PERCENT: 67.8 % (ref 37–80)
HEMATOCRIT: 41.5 % (ref 37.7–47.9)
Hemoglobin: 14.9 g/dL (ref 12.2–16.2)
LYMPH, POC: 3.6 — AB (ref 0.6–3.4)
MCH, POC: 32.4 pg — AB (ref 27–31.2)
MCHC: 36 g/dL — AB (ref 31.8–35.4)
MCV: 90 fL (ref 80–97)
MID (CBC): 0.4 (ref 0–0.9)
MPV: 6.9 fL (ref 0–99.8)
POC GRANULOCYTE: 8.3 — AB (ref 2–6.9)
POC LYMPH PERCENT: 29.1 %L (ref 10–50)
POC MID %: 3.1 %M (ref 0–12)
Platelet Count, POC: 428 10*3/uL — AB (ref 142–424)
RBC: 4.61 M/uL (ref 4.04–5.48)
RDW, POC: 13.1 %
WBC: 12.3 10*3/uL — AB (ref 4.6–10.2)

## 2016-08-25 MED ORDER — LOSARTAN POTASSIUM-HCTZ 50-12.5 MG PO TABS: 1.0000 | ORAL_TABLET | Freq: Every day | ORAL | 1 refills | Status: DC

## 2016-08-25 MED ORDER — ALPRAZOLAM 2 MG PO TABS: 1.0000 mg | ORAL_TABLET | Freq: Three times a day (TID) | ORAL | 0 refills | Status: DC | PRN

## 2016-08-25 MED ORDER — OXYCODONE-ACETAMINOPHEN 10-325 MG PO TABS: 1.0000 | ORAL_TABLET | Freq: Every day | ORAL | 0 refills | Status: DC | PRN

## 2016-08-25 MED ORDER — AZITHROMYCIN 250 MG PO TABS: ORAL_TABLET | ORAL | 0 refills | Status: DC

## 2016-08-25 MED ORDER — ALPRAZOLAM 2 MG PO TABS: 1.0000 mg | ORAL_TABLET | Freq: Three times a day (TID) | ORAL | 1 refills | Status: DC | PRN

## 2016-08-25 MED ORDER — CYCLOBENZAPRINE HCL 10 MG PO TABS: 10.0000 mg | ORAL_TABLET | Freq: Every day | ORAL | 1 refills | Status: DC

## 2016-08-25 NOTE — Patient Instructions (Addendum)
Follow up with cardiology as planned, but if any further fast heart rate with heart palpitations - go to ER or call 911.   Your blood count is still slightly elevated, and there is still some congestion on chest x-ray. Start azithromycin for possible persistent pneumonia, over-the-counter Mucinex if needed for cough, and if any fevers, shortness of breath, or any worsening symptoms, return here or the emergency room. We can repeat your chest x-ray in 4-6 weeks to make sure that area has cleared, as long as you are improving this week with antibiotic. No change in blood pressure medications for now.  I refilled the Flexeril and oxycodone if needed for breakthrough low back pain, but as we discussed I would like to follow-up within the next 4-6 weeks to discuss other options including follow-up with an orthopedist. I'm happy to refer you to an orthopedist before that time if you would like. Due to potential side effects and additive risks of medication, do not combine the oxycodone with alprazolam.   For your anxiety, I would like you to call your therapist as that may be helpful in treatment of current worsened anxiety. I would recommend an SSRI such as Prozac, Zoloft, or Lexapro as that is often a better treatment for anxiety than just alprazolam alone. If you would like, you can discuss medications further with your psychologist or if would like to meet with psychiatry to discuss other options for anxiety, let me know and I'll be happy to refer you. Follow-up in the next 4-6 weeks to discuss those medications further.  Return to the clinic or go to the nearest emergency room if any of your symptoms worsen or new symptoms occur.   IF you received an x-ray today, you will receive an invoice from [facility]. Please contact [facility] at [phone] with questions or concerns regarding your invoice.   IF you received labwork today, you will receive an invoice from [facility]. Please  contact [facility] at [phone] with questions or concerns regarding your invoice.   Our billing staff will not be able to assist you with questions regarding bills from these companies.  You will be contacted with the lab results as soon as they are available. The fastest way to get your results is to activate your My Chart account. Instructions are located on the last page of this paperwork. If you have not heard from [place] regarding the results in 2 weeks, please contact this office.

## 2016-08-25 NOTE — Progress Notes (Signed)
Subjective:    Patient ID: Madison West, female    DOB: [birth year], 61 y.o.   MRN: [id number]  HPI Madison West is a 60 y.o. female  New patient to me, here for hospital follow-up. She is also requesting refills of all medications at this time. History of breast cancer, anxiety, hypertension, other medical problems per problem list. Followed by Dr. Joseph Art previously.   ER/Hospital follow-up: She was seen in the ER 08/04/16. She was experiencing palpitations that day with a heart rate of 153 at home. Initial blood pressure in the ER was 164/110. Temp of 99.5, heart rate of 152. WBC 17.1 with platelets 416. EKG read as junctional tachycardia with left axis deviation, repolarization abnormality probably rate related, borderline prolonged QT interval. She had a chest x-ray that was suspicious for left basilar airspace opacity/mild pneumonia. She was given adenosine 6 mg IV push for SVT, with return to sinus rhythm, without complications. He was monitored in the ED, then cardiology outpatient follow-up. Doxycycline was also given in the ED for a small pneumonia.  - Has had some palpitations about once per day, but when checking rate - it runs about 100-110. Has not had as high as in ER. No recent chest pains or shortness of breath. Has appt to be seen by Dr. Einar Gip this upcoming Monday (6 days). Currently takes Hyzaar for HTN. Home BP 117/77 at rest few days ago, then other times 133/100. Overall has been controlled. No new side effects of meds. TSH was ok in ER, sodium borderline low, potassium normal, glucose 126 but had been eating prior.   Pneumonia: Finished doxycycline for pneumonia about 2/23. Had been improving, then 1 week ago had some cough with PND, discolored phlegm.  No fever. No dyspnea. Some nasal congestion. Some fatigue.   Anxiety: Takes 74m tablet - 3 times per day every day, usually 1/2 to 3/4 pill in am, 1/2 in afternoon, 1/2 -1 at night. Occasionally will take full pill  instead of 1/2 pill.  #90 with 5 refills provided 02/04/16. Running out of xanax. Has a few left. Concerned that prescription sent to your pharmacy may take up to 30 days to be authorized, as that has occurred in the past. Won't take antidepressants such as SSRI's - scared to use those. Has not taken any of those antidepressants since the 1980's.  Had been prescribed prozac years ago, but afraid to take that medication. Has not met therapist recently, last visit about 2 years ago due to trouble with scheduling. Therapist is JKerney Elbe  Would like to follow up with same therapist if needed. Anxiety has been "through the roof" recently.  Financial issues, daughter with domestic issues, and other stressors. Taking xanax as above.  No SI.  Occasionally sad, but denies depression, only anxiety.   History of breast cancer in 2009, chemo and radiation. Took Femara for 5 years, but has been released by oncologist. Last [facility] 12/04/15 - no radiographic evidence of malignancy.   History of occasional back pain, and hip pain - usually left greater than right. No bowel or bladder incontinence, no saddle anesthesia, no lower extremity weakness. Takes flexeril as needed - about once per week. #30 with 11 refills 07/03/15. She may not have refilled that for all provider refills. Can't take hydrocodone - took once after a tooth issue and woke up after a sound sleep with panic symptoms. Can take oxycodone without side effects. Uses that medication once or twice a week typically, without  new side effects.  #30 rx on 02/04/16. Has 2 oxycodone left.   DDD on lumbar spine XR 09/2013.  Had physical therapy at [facility] few years ago. No history of epidural spinal injection. No recent ortho or back specialist eval. Only rare advil. No hx of back surgery.    Review of Systems  Constitutional: Negative for chills and fever.  HENT: Positive for congestion and postnasal drip.   Respiratory: Positive for cough.   Musculoskeletal:  Positive for arthralgias (low back, chronic. ) and back pain.  Psychiatric/Behavioral: Negative for dysphoric mood and suicidal ideas. The patient is nervous/anxious.       Objective:   Physical Exam  Constitutional: She is oriented to person, place, and time. She appears well-developed and well-nourished.  HENT:  Head: Normocephalic and atraumatic.  Mouth/Throat: Oropharynx is clear and moist.  Eyes: Conjunctivae and EOM are normal. Pupils are equal, round, and reactive to light.  Neck: Trachea normal. Carotid bruit is not present. No thyromegaly present.  Cardiovascular: Normal rate, regular rhythm, normal heart sounds and intact distal pulses.   No murmur heard. Pulmonary/Chest: Effort normal and breath sounds normal. No respiratory distress. She has no wheezes. She has no rales.  Possible faint coarse breath sound left lower lobe, but overall clear without wheezes and normal effort, no distress.  Abdominal: Soft. Normal appearance. She exhibits no abdominal bruit and no pulsatile midline mass. There is no tenderness.  Neurological: She is alert and oriented to person, place, and time.  Skin: Skin is warm and dry.  Psychiatric: Her mood appears anxious. She expresses no suicidal ideation.  Anxious appearing with discussion of symptoms, with slight agitation with discussion of meds and alternatives. Good eye contact, no SI/HI.   Vitals reviewed.  Vitals:   08/25/16 1502  BP: 110/72  Pulse: (!) 108  Resp: 18  Temp: 98.7 F (37.1 C)  TempSrc: Oral  SpO2: 97%  Weight: 206 lb (93.4 kg)  Height: 5' 9"  (1.753 m)   Dg Chest 2 View  Result Date: 08/25/2016 CLINICAL DATA:  61 y/o  F; cough and prior left basilar pneumonia. EXAM: CHEST  2 VIEW COMPARISON:  08/04/2016 chest radiograph. FINDINGS: There are residual left basilar linear opacity probably representing atelectasis or scarring. No consolidation. Normal cardiac silhouette. No acute osseous abnormality. No pleural effusion or  pneumothorax. IMPRESSION: Minimal residual left basilar linear opacity probably represents atelectasis or scarring. No consolidation. Electronically Signed   By: Kristine Garbe M.D.   On: 08/25/2016 17:24   Results for orders placed or performed in visit on 16/10/96  Basic metabolic panel  Result Value Ref Range   Glucose 86 65 - 99 mg/dL   BUN 14 8 - 27 mg/dL   Creatinine, Ser 0.67 0.57 - 1.00 mg/dL   GFR calc non Af Amer 95 >59 mL/min/1.73   GFR calc Af Amer 109 >59 mL/min/1.73   BUN/Creatinine Ratio 21 12 - 28   Sodium 140 134 - 144 mmol/L   Potassium 4.4 3.5 - 5.2 mmol/L   Chloride 97 96 - 106 mmol/L   CO2 26 18 - 29 mmol/L   Calcium 9.5 8.7 - 10.3 mg/dL  POCT CBC  Result Value Ref Range   WBC 12.3 (A) 4.6 - 10.2 K/uL   Lymph, poc 3.6 (A) 0.6 - 3.4   POC LYMPH PERCENT 29.1 10 - 50 %L   MID (cbc) 0.4 0 - 0.9   POC MID % 3.1 0 - 12 %M   POC  Granulocyte 8.3 (A) 2 - 6.9   Granulocyte percent 67.8 37 - 80 %G   RBC 4.61 4.04 - 5.48 M/uL   Hemoglobin 14.9 12.2 - 16.2 g/dL   HCT, POC 41.5 37.7 - 47.9 %   MCV 90.0 80 - 97 fL   MCH, POC 32.4 (A) 27 - 31.2 pg   MCHC 36.0 (A) 31.8 - 35.4 g/dL   RDW, POC 13.1 %   Platelet Count, POC 428 (A) 142 - 424 K/uL   MPV 6.9 0 - 99.8 fL     Over 45 minutes with initial history and discussion of medications and plan along with exam.    Assessment & Plan:   Madison West is a 61 y.o. female SVT (supraventricular tachycardia) ([place])  - Converted in ER with adenosine. Episodic mild palpitations since that time, but heart rate reportedly not been at the level as in the ER.    -Follow-up with cardiology as planned, but if return of palpitations or fast heart rate, ER precautions/911 precautions were discussed.  Cough - Plan: POCT CBC, DG Chest 2 View Pneumonia of left lower lobe due to infectious organism [facility]) - Plan: POCT CBC, DG Chest 2 View  - Some improvement after doxycycline with recurrence of cough, URI symptoms. Possible  new viral illness, but with leukocytosis and XR with still left lower lobe findings, decided to cover with azithromycin for possible persistent pneumonia.  RTC precautions if any worsening, or not improving with this antibiotic.   Chronic low back pain, unspecified back pain laterality, with sciatica presence unspecified - Plan: cyclobenzaprine (FLEXERIL) 10 MG tablet, oxyCODONE-acetaminophen (PERCOCET) 10-325 MG tablet  - Remote x-ray reviewed, degenerative disc disease noted. Has not been seen by orthopedics or back specialist recently, or had physical therapy recently, but did have some reported benefit from previous physical therapy.  - Discussed other potential treatment options for her back pain other than muscle relaxant or narcotic pain medication, as potential risks of those medications.  I also discussed the risks of use of narcotic pain medication with benzodiazepine and potential accidental overdose risk (inclduing recommendations from [facility]). Recommended against taking oxycodone and alprazolam at the same time. Epidural spinal injection may be an option, but she does not want any steroids. Repeat physical therapy treatment may also be an option.   -Did not refer her at this time, as plan to discuss options further at follow up in 4-6 weeks. I did agree to refill oxycodone and Flexeril for intermittent/temporary use for now as not taking every day, but would want her to be seen by a back specialist or possibly pain management if these medicines continue to be required.  Generalized anxiety disorder - Plan: alprazolam (XANAX) 2 MG tablet, DISCONTINUED: alprazolam (XANAX) 2 MG tablet  - Uncontrolled by history. Has only been on benzodiazepine treatment recently. She is opposed to trying SSRI at this time as concerned about those medications. I discussed with her that the benzodiazepines may carry more risks than the SSRIs, as well as typical treatment of anxiety with cognitive behavioral therapy and  daily SSRI, with benzodiazepine for breakthrough symptoms.   -Agreed to refill alprazolam for now, but recommended follow-up with her therapist to discuss anxiety as well as to discuss daily medication further. Also could meet with psychiatry if she preferred to discuss other medication treatment options beyond just daily benzodiazepine. As above, discussed risk of benzodiazepine and narcotic pain medication.   - #30 of alprazolam given to fill now, while checking  into refill status and timing of refill from mail order pharmacy (#90, 1 refill).   Hyponatremia - Plan: Basic metabolic panel  - Borderline in ER, repeat BMP. Okay as above.  Essential hypertension - Plan: Basic metabolic panel, losartan-hydrochlorothiazide (HYZAAR) 50-12.5 MG tablet  - Stable on current dose of Hyzaar. Check a BMP as above, reassuring creatinine, continue same dose, 6 months of refills were provided.  Left-sided low back pain without sciatica, unspecified chronicity - Plan: cyclobenzaprine (FLEXERIL) 10 MG tablet, oxyCODONE-acetaminophen (PERCOCET) 10-325 MG tablet  - Long-standing low back pain, with intermittent use of Flexeril or oxycodone as did not tolerate hydrocodone in the past. Agreed to refill his medications at this point, but follow up in the next 4-6 weeks to discuss alternative treatments that may carry less side effect risk.   Meds ordered this encounter  Medications  . DISCONTD: alprazolam Duanne Moron) 2 MG tablet    Sig: Take 0.5-1 tablets (1-2 mg total) by mouth 3 (three) times daily as needed for sleep.    Dispense:  90 tablet    Refill:  1  . losartan-hydrochlorothiazide (HYZAAR) 50-12.5 MG tablet    Sig: Take 1 tablet by mouth daily.    Dispense:  90 tablet    Refill:  1  . cyclobenzaprine (FLEXERIL) 10 MG tablet    Sig: Take 1 tablet (10 mg total) by mouth at bedtime.    Dispense:  30 tablet    Refill:  1  . oxyCODONE-acetaminophen (PERCOCET) 10-325 MG tablet    Sig: Take 1 tablet by mouth  daily as needed for pain. To take one tablet once or twice daily as needed for back and hip pain.    Dispense:  30 tablet    Refill:  0  . azithromycin (ZITHROMAX) 250 MG tablet    Sig: Take 2 pills by mouth on day 1, then 1 pill by mouth per day on days 2 through 5.    Dispense:  6 tablet    Refill:  0  . alprazolam (XANAX) 2 MG tablet    Sig: Take 0.5-1 tablets (1-2 mg total) by mouth 3 (three) times daily as needed for sleep.    Dispense:  30 tablet    Refill:  0   Patient Instructions   Follow up with cardiology as planned, but if any further fast heart rate with heart palpitations - go to ER or call 911.   Your blood count is still slightly elevated, and there is still some congestion on chest x-ray. Start azithromycin for possible persistent pneumonia, over-the-counter Mucinex if needed for cough, and if any fevers, shortness of breath, or any worsening symptoms, return here or the emergency room. We can repeat your chest x-ray in 4-6 weeks to make sure that area has cleared, as long as you are improving this week with antibiotic. No change in blood pressure medications for now.  I refilled the Flexeril and oxycodone if needed for breakthrough low back pain, but as we discussed I would like to follow-up within the next 4-6 weeks to discuss other options including follow-up with an orthopedist. I'm happy to refer you to an orthopedist before that time if you would like. Due to potential side effects and additive risks of medication, do not combine the oxycodone with alprazolam.   For your anxiety, I would like you to call your therapist as that may be helpful in treatment of current worsened anxiety. I would recommend an SSRI such as Prozac, Zoloft, or Lexapro as  that is often a better treatment for anxiety than just alprazolam alone. If you would like, you can discuss medications further with your psychologist or if would like to meet with psychiatry to discuss other options for anxiety,  let me know and I'll be happy to refer you. Follow-up in the next 4-6 weeks to discuss those medications further.  Return to the clinic or go to the nearest emergency room if any of your symptoms worsen or new symptoms occur.   IF you received an x-ray today, you will receive an invoice from [facility]. Please contact [facility] at [phone] with questions or concerns regarding your invoice.   IF you received labwork today, you will receive an invoice from [facility]. Please contact [facility] at [phone] with questions or concerns regarding your invoice.   Our billing staff will not be able to assist you with questions regarding bills from these companies.  You will be contacted with the lab results as soon as they are available. The fastest way to get your results is to activate your My Chart account. Instructions are located on the last page of this paperwork. If you have not heard from [place] regarding the results in 2 weeks, please contact this office.      I personally performed the services described in this documentation, which was scribed in my presence. The recorded information has been reviewed and considered for accuracy and completeness, addended by me as needed, and agree with information above.  Signed,   Merri Ray, MD [facility].  08/26/16 1:29 PM

## 2016-08-26 LAB — BASIC METABOLIC PANEL
BUN/Creatinine Ratio: 21 (ref 12–28)
BUN: 14 mg/dL (ref 8–27)
CHLORIDE: 97 mmol/L (ref 96–106)
CO2: 26 mmol/L (ref 18–29)
Calcium: 9.5 mg/dL (ref 8.7–10.3)
Creatinine, Ser: 0.67 mg/dL (ref 0.57–1.00)
GFR, EST AFRICAN AMERICAN: 109 mL/min/{1.73_m2} (ref >59)
GFR, EST NON AFRICAN AMERICAN: 95 mL/min/{1.73_m2} (ref >59)
Glucose: 86 mg/dL (ref 65–99)
POTASSIUM: 4.4 mmol/L (ref 3.5–5.2)
SODIUM: 140 mmol/L (ref 134–144)

## 2016-09-09 ENCOUNTER — Encounter: Payer: Self-pay | Admitting: Family Medicine

## 2016-09-25 ENCOUNTER — Telehealth: Payer: Self-pay | Admitting: Emergency Medicine

## 2016-09-25 DIAGNOSIS — I10 Essential (primary) hypertension: Secondary | ICD-10-CM

## 2016-09-25 DIAGNOSIS — F411 Generalized anxiety disorder: Secondary | ICD-10-CM

## 2016-09-29 MED ORDER — LOSARTAN POTASSIUM-HCTZ 50-12.5 MG PO TABS: 1.0000 | ORAL_TABLET | Freq: Every day | ORAL | 1 refills | Status: DC

## 2016-09-29 MED ORDER — ALPRAZOLAM 2 MG PO TABS: 1.0000 mg | ORAL_TABLET | Freq: Three times a day (TID) | ORAL | 1 refills | Status: DC | PRN

## 2016-09-29 NOTE — Telephone Encounter (Signed)
Pt made aware Xanax script faxed to [facility] #90, 1 refill  Fax number [phone]  Verified with [facility] script was never received via fax.  Advised patient to call clinic if temporary script needed while waiting for refills.  Verbalized understanding   Dr. Carlota Raspberry aware of changes

## 2016-09-29 NOTE — Addendum Note (Signed)
Addended by: Merri Ray R on: 09/29/2016 03:28 PM   Modules accepted: Orders

## 2016-09-29 NOTE — Progress Notes (Signed)
3:24 PM 09/29/16 Discussed with clinical manager. Patient called as pharmacy at [facility] have not received prescription for xanax form 08/25/16 visit. Clinical manager, Candie Chroman did call [facility] and verified that they have not received a prescription. I reprinted prescription today to be sent to [facility] as originally planned.

## 2016-10-20 DIAGNOSIS — I10 Essential (primary) hypertension: Secondary | ICD-10-CM

## 2016-12-09 LAB — HM MAMMOGRAPHY

## 2017-04-05 ENCOUNTER — Ambulatory Visit: Admitting: Family Medicine

## 2017-04-06 ENCOUNTER — Ambulatory Visit (INDEPENDENT_AMBULATORY_CARE_PROVIDER_SITE_OTHER): Admitting: Family Medicine

## 2017-04-06 VITALS — BP 124/76 | HR 95 | Temp 97.8°F | Resp 18 | Ht 69.0 in | Wt 208.0 lb

## 2017-04-06 DIAGNOSIS — G8929 Other chronic pain: Secondary | ICD-10-CM | POA: Diagnosis not present

## 2017-04-06 DIAGNOSIS — Z13220 Encounter for screening for lipoid disorders: Secondary | ICD-10-CM | POA: Diagnosis not present

## 2017-04-06 DIAGNOSIS — I10 Essential (primary) hypertension: Secondary | ICD-10-CM

## 2017-04-06 DIAGNOSIS — I471 Supraventricular tachycardia: Secondary | ICD-10-CM

## 2017-04-06 DIAGNOSIS — M545 Low back pain, unspecified: Secondary | ICD-10-CM

## 2017-04-06 DIAGNOSIS — F411 Generalized anxiety disorder: Secondary | ICD-10-CM

## 2017-04-06 MED ORDER — LOSARTAN POTASSIUM-HCTZ 50-12.5 MG PO TABS: 1.0000 | ORAL_TABLET | Freq: Every day | ORAL | 1 refills | Status: DC

## 2017-04-06 MED ORDER — CYCLOBENZAPRINE HCL 10 MG PO TABS: 10.0000 mg | ORAL_TABLET | Freq: Every day | ORAL | 1 refills | Status: DC

## 2017-04-06 MED ORDER — ALPRAZOLAM 2 MG PO TABS: 1.0000 mg | ORAL_TABLET | Freq: Three times a day (TID) | ORAL | 0 refills | Status: DC | PRN

## 2017-04-06 MED ORDER — OXYCODONE-ACETAMINOPHEN 10-325 MG PO TABS
1.0000 | ORAL_TABLET | Freq: Every day | ORAL | 0 refills | Status: DC | PRN
Start: 2017-04-06 — End: 2017-10-15

## 2017-04-06 NOTE — Progress Notes (Addendum)
Subjective:  By signing my name below, I, Madison West, attest that this documentation has been prepared under the direction and in the presence of [name], MD Electronically Signed: Ladene Artist, ED Scribe 04/06/2017 at 12:23 PM.   Patient ID: Madison West, female    DOB: 1956/01/05, 61 y.o.   MRN: [id number]  Chief Complaint  Patient presents with  . Medication Refill    Xanax, Oxycodone, and Losartan   HPI Madison West is a 61 y.o. female who presents to Primary Care at [facility] for medication refills. Last seen 08/25/16. She was a new pt to me at that time. Previously followed by Dr. Joseph Art and was seen after an episode of SVT. Treated in the ER with adenosine with cardiology follow-up Dr. Einar Gip.  H/o SVT and HTN As above. Followed by Dr. Einar Gip. Takes Losartan-HCTZ 50-12.5 mg qd. Office visit with cardiology 4/11. Had echo and stress test. Decreased exercise tolerance, mild aortic stenosis, no evidence of ischemia. Minimal palpitations. Planned for observation in regard to SVT. Planned for repeat in eval in 1 year, sooner if recurrence of SVT.   Pt states that she suspects that SVT was due to pneumonia or decreasing Xanax; states she had decreased to 2 half tablets that day. Reports that she has been experiencing some intermittent heart palpitations that resolve with taking Xanax. Denies cp, sob, light-headedness, dizziness, HA. Pt states that Dr. Joseph Art would give her 6 refills out so she wouldn't have to come in and the [place] wouldn't consider it as a new prescription. She states "when your prescription ends and you write a new one, it's [facility]" so she ends up having to pay for her medication at [facility] but never has to pay for her medications when it comes from the [place].   Anxiety See discussion at last visit in March. Had been treated with just Xanax 2 mg previously, 0.5-1 pill x3 daily. Usually 1/2 to 3 quarters in the AM, 1/2 in  the afternoon, 1 1/2 at night. Previous prescription #90 with 5 refills in August 2017. She expressed that she wouldn't take SSRIs due to fear of that group of meds. Had not taken antidepressants since 1980s. Had been prescribed Prozac years ago, although she never tried it due to fear. Had a therapist Kerney Elbe but had not seen her in 2 years. Pt had worsening anxiety in March. Agreed to refill Xanax at last visit with plan on f/u with therapist to discus anxiety and daily medication such as SSRI further. Also discussed risk of benzodiazepine and narcotic pain medication with risk of accidental overdose. Given #30 Xanax in the office then #90 with 1 refill. Was receiving medications through [facility].   Reports that this month is a difficult month for her as this is the 10 year anniversary of her husband's death. States she has not met with her therapist since last visit due to cost. Also does not want to try SSRIs; states new medications causes her major anxiety. Reports occasional alcohol use. Denies SI/HI.   H/o Breast CA 2009 s/p chemo and radiation. Took femara for 5 years. Released by oncology. Mammogram 12/09/16.   Episodic Back and Hip Pain Discussed in March. Took Flexeril PRN once per week. Intolerant of hydrocodone in the past but was prescribed oxycodone without side-effects. H/o PT for her back. Refilled her Flexeril and #30 of oxycodone 10-325 for intermittent or breakthrough pain but not to combine with benzodiazapine  as above. Denies taking oxycodone and Xanax together. Rarely takes Flexeril. Denies worsening back pain, bladder/bowel incontinence, saddle anesthesia, weakness in LE.  Patient Active Problem List   Diagnosis Date Noted  . Genetic testing 03/01/2015  . Breast cancer of lower-outer quadrant of right female breast ([place]) 05/25/2013  . Sciatica of right side 10/19/2012  . CRPS (complex regional pain syndrome), upper limb 10/19/2012  . History of DVT (deep vein thrombosis)  10/05/2011  . Hypertension 10/04/2011  . Anxiety 10/04/2011  . Depression 10/04/2011  . Personal history of vulvar dysplasia 08/15/2011  . 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 S/P breast lumpectomy    Past Medical History:  Diagnosis Date  . Anxiety   . Arthritis    low back and hip pain intermittent  . Breast cancer ([place]) 06/02/07   r breast -surgery ,radiaology. chemotherapy  . Carpal tunnel syndrome    right hand  . Colon polyps   . Complication of anesthesia    Fentanyl, Versed-makes extra hyper, bradycardia x 1 episode in past  . Depression   . Dysplasia of vulva   . Hypertension   . S/P breast lumpectomy 07/04/07   R breast  . S/P radiation therapy 2009   Past Surgical History:  Procedure Laterality Date  . APPENDECTOMY     age 36  . CESAREAN SECTION     x 2  . COLONOSCOPY W/ POLYPECTOMY    . COLONOSCOPY WITH PROPOFOL N/A 04/27/2016   Procedure: COLONOSCOPY WITH PROPOFOL;  Surgeon: Garlan Fair, MD;  Location: [facility] ENDOSCOPY;  Service: Endoscopy;  Laterality: N/A;  . DILATION AND CURETTAGE OF UTERUS     multiple  . INTRAUTERINE DEVICE INSERTION    . IUD REMOVAL    . MASTECTOMY PARTIAL / LUMPECTOMY W/ AXILLARY LYMPHADENECTOMY    . TUBAL LIGATION    . VULVA SURGERY     Multiple times for dysplasia   Allergies  Allergen Reactions  . Prednisone Other (See Comments)  . Fentanyl Other (See Comments)    MAKES SUPER HYPER PER PT  . Midazolam Anxiety  . Vicodin [Hydrocodone-Acetaminophen] Anxiety    Extreme anxiety.   Prior to Admission medications   Medication Sig Start Date End Date Taking? Authorizing Provider  alprazolam Duanne Moron) 2 MG tablet Take 0.5-1 tablets (1-2 mg total) by mouth 3 (three) times daily as needed for sleep. 09/29/16   [name], MD  Calcium Carbonate-Vitamin D (CALCIUM + D PO) Take 600 mg by mouth daily.      [provider]  cyclobenzaprine (FLEXERIL) 10 MG tablet Take 1 tablet (10 mg total) by mouth at  bedtime. 08/25/16   [name], MD  losartan-hydrochlorothiazide (HYZAAR) 50-12.5 MG tablet Take 1 tablet by mouth daily. 09/29/16   [name], MD  oxyCODONE-acetaminophen (PERCOCET) 10-325 MG tablet Take 1 tablet by mouth daily as needed for pain. To take one tablet once or twice daily as needed for back and hip pain. 08/25/16   [name], MD   Social History   Social History  . Marital status: Widowed    Spouse name: N/A  . Number of children: 2  . Years of education: N/A   Occupational History  . server [employer]   Social History Main Topics  . Smoking status: Former Smoker    Packs/day: 0.75    Years: 20.00    Types: Cigarettes    Quit date: 05/06/1997  . Smokeless tobacco: Never Used  . Alcohol  use 0.0 oz/week     Comment: 2 daily  . Drug use: No  . Sexual activity: No   Other Topics Concern  . Not on file   Social History Narrative  . No narrative on file   Review of Systems  Constitutional: Negative for fatigue and unexpected weight change.  Respiratory: Negative for chest tightness and shortness of breath.   Cardiovascular: Positive for palpitations. Negative for chest pain and leg swelling.  Gastrointestinal: Negative for abdominal pain and blood in stool.  Genitourinary: Negative for enuresis.  Musculoskeletal: Positive for back pain (episodic).  Neurological: Negative for dizziness, syncope, weakness, light-headedness and headaches.  Psychiatric/Behavioral: Negative for suicidal ideas. The patient is nervous/anxious.       Objective:   Physical Exam  Constitutional: She is oriented to person, place, and time. She appears well-developed and well-nourished.  HENT:  Head: Normocephalic and atraumatic.  Eyes: Pupils are equal, round, and reactive to light. Conjunctivae and EOM are normal.  Neck: Carotid bruit is not present.  Cardiovascular: Normal rate, regular rhythm, normal heart sounds and intact distal pulses.   Pulmonary/Chest:  Effort normal and breath sounds normal.  No focal bony tenderness. Flexion to 90 degrees. Slight tenderness in paraspinal muscles.  Abdominal: Soft. She exhibits no pulsatile midline mass. There is no tenderness.  Neurological: She is alert and oriented to person, place, and time.  Skin: Skin is warm and dry.  Psychiatric: She has a normal mood and affect. Her behavior is normal.  Vitals reviewed.  Vitals:   04/06/17 1148  BP: 124/76  Pulse: 95  Resp: 18  Temp: 97.8 F (36.6 C)  TempSrc: Oral  SpO2: 95%  Weight: 208 lb (94.3 kg)  Height: _0  (1.753 m)      Assessment & Plan:   SHIARA MCGOUGH is a 61 y.o. female Essential hypertension - Plan: Comprehensive metabolic panel, losartan-hydrochlorothiazide (HYZAAR) 50-12.5 MG tablet  - BP overall stable. Can monitor home readings, no change in meds at present. Labs pending.   Generalized anxiety disorder - Plan: alprazolam Duanne Moron) 2 MG tablet, DISCONTINUED: alprazolam (XANAX) 2 MG tablet  - discussed SSRi or other daily med in past, with planned follow up earlier this year. She is hesitant to start new med as above, but discussed potential risks with benzodiazepines, especially with any narcotics, but also discussed likely anxiety improvement with SSRI. Agreed to defer this discussion and change in meds for next 1-2 months as currently a difficult month for her.   -consider psychiatry eval if declined any med other than BDZ to make sure this is appropriate regimen.  SVT (supraventricular tachycardia) ([place])  - resolved, but ER and follow up precautions with cardiology discussed if recurrence of symptoms.   Screening for hyperlipidemia - Plan: Lipid panel  Chronic low back pain, unspecified back pain laterality, with sciatica presence unspecified, Left-sided low back pain without sciatica, unspecified chronicity - Plan: cyclobenzaprine (FLEXERIL) 10 MG tablet, oxyCODONE-acetaminophen (PERCOCET) 10-325 MG tablet  - has been managed  with intermittent use of flexeril or rare use percocet. Refilled both for intermittent use. Advised to not combine these meds with other sedating meds.   Meds ordered this encounter  Medications  . DISCONTD: alprazolam Duanne Moron) 2 MG tablet    Sig: Take 0.5-1 tablets (1-2 mg total) by mouth 3 (three) times daily as needed for anxiety.    Dispense:  90 tablet    Refill:  0  . cyclobenzaprine (FLEXERIL) 10 MG tablet    Sig:  Take 1 tablet (10 mg total) by mouth at bedtime.    Dispense:  30 tablet    Refill:  1  . losartan-hydrochlorothiazide (HYZAAR) 50-12.5 MG tablet    Sig: Take 1 tablet by mouth daily.    Dispense:  90 tablet    Refill:  1  . oxyCODONE-acetaminophen (PERCOCET) 10-325 MG tablet    Sig: Take 1 tablet by mouth daily as needed for pain. To take one tablet once or twice daily as needed for back and hip pain.    Dispense:  30 tablet    Refill:  0  . alprazolam (XANAX) 2 MG tablet    Sig: Take 0.5-1 tablets (1-2 mg total) by mouth 3 (three) times daily as needed for anxiety.    Dispense:  90 tablet    Refill:  0   Patient Instructions   Keep a record of your blood pressures outside of the office and if remaining over 140/90 - please return to discuss changes. I will check some kidney, liver tests, and cholesterol tests today.  If you are having heart palpitations or racing heart symptoms, call your cardiologist for follow-up. However if the symptoms are persistent, or associated with any chest pain, shortness of breath, lightheadedness, or dizziness, call 911 or go to the emergency room.  Flexeril and Percocet were refilled for intermittent back pain. Do not combine these medications, and avoid combining other sedating medicines such as Xanax with either of these medications. Additionally do not drink alcohol while taking any sedating medicines including your Xanax, Flexeril, or Percocet.  As we discussed I would recommend a daily medication for anxiety, as well as returning  to counseling. If it is cost prohibitive to see your counselor at present time, we can discuss that further at next visit as well as look into other options if needed. Please follow-up in the next 2 months to discuss starting SSRI at that time. If you do not want to start an SSRI, I would ask that you meet with psychiatry to make sure that current medication is appropriate treatment for your anxiety, or to look into other treatment options.  I'm happy to refer you at any time. I refilled one prescription of Xanax for local refill, and the other one we can send to [facility].   Return to the clinic or go to the nearest emergency room if any of your symptoms worsen or new symptoms occur.     IF you received an x-ray today, you will receive an invoice from [facility]. Please contact [facility] at [phone] with questions or concerns regarding your invoice.   IF you received labwork today, you will receive an invoice from [facility]. Please contact [facility] at [phone] with questions or concerns regarding your invoice.   Our billing staff will not be able to assist you with questions regarding bills from these companies.  You will be contacted with the lab results as soon as they are available. The fastest way to get your results is to activate your My Chart account. Instructions are located on the last page of this paperwork. If you have not heard from [place] regarding the results in 2 weeks, please contact this office.       I personally performed the services described in this documentation, which was scribed in my presence. The recorded information has been reviewed and considered for accuracy and completeness, addended by me as needed, and agree with information above.  Signed,   Merri Ray, MD Primary Care  at [place].  04/09/17 5:50 PM

## 2017-04-06 NOTE — Patient Instructions (Addendum)
Keep a record of your blood pressures outside of the office and if remaining over 140/90 - please return to discuss changes. I will check some kidney, liver tests, and cholesterol tests today.  If you are having heart palpitations or racing heart symptoms, call your cardiologist for follow-up. However if the symptoms are persistent, or associated with any chest pain, shortness of breath, lightheadedness, or dizziness, call 911 or go to the emergency room.  Flexeril and Percocet were refilled for intermittent back pain. Do not combine these medications, and avoid combining other sedating medicines such as Xanax with either of these medications. Additionally do not drink alcohol while taking any sedating medicines including your Xanax, Flexeril, or Percocet.  As we discussed I would recommend a daily medication for anxiety, as well as returning to counseling. If it is cost prohibitive to see your counselor at present time, we can discuss that further at next visit as well as look into other options if needed. Please follow-up in the next 2 months to discuss starting SSRI at that time. If you do not want to start an SSRI, I would ask that you meet with psychiatry to make sure that current medication is appropriate treatment for your anxiety, or to look into other treatment options.  I'm happy to refer you at any time. I refilled one prescription of Xanax for local refill, and the other one we can send to [facility].   Return to the clinic or go to the nearest emergency room if any of your symptoms worsen or new symptoms occur.     IF you received an x-ray today, you will receive an invoice from [facility]. Please contact [facility] at [phone] with questions or concerns regarding your invoice.   IF you received labwork today, you will receive an invoice from [facility]. Please contact [facility] at [phone] with questions or concerns regarding your invoice.   Our billing staff will  not be able to assist you with questions regarding bills from these companies.  You will be contacted with the lab results as soon as they are available. The fastest way to get your results is to activate your My Chart account. Instructions are located on the last page of this paperwork. If you have not heard from [place] regarding the results in 2 weeks, please contact this office.

## 2017-04-07 LAB — COMPREHENSIVE METABOLIC PANEL
ALT: 16 IU/L (ref 0–32)
AST: 16 IU/L (ref 0–40)
Albumin/Globulin Ratio: 1.7 (ref 1.2–2.2)
Albumin: 4.4 g/dL (ref 3.6–4.8)
Alkaline Phosphatase: 66 IU/L (ref 39–117)
BUN/Creatinine Ratio: 14 (ref 12–28)
BUN: 10 mg/dL (ref 8–27)
Bilirubin Total: 0.4 mg/dL (ref 0.0–1.2)
CO2: 23 mmol/L (ref 20–29)
CREATININE: 0.73 mg/dL (ref 0.57–1.00)
Calcium: 9.7 mg/dL (ref 8.7–10.3)
Chloride: 97 mmol/L (ref 96–106)
GFR calc Af Amer: 101 mL/min/{1.73_m2} (ref >59)
GFR, EST NON AFRICAN AMERICAN: 88 mL/min/{1.73_m2} (ref >59)
GLUCOSE: 109 mg/dL — AB (ref 65–99)
Globulin, Total: 2.6 g/dL (ref 1.5–4.5)
Potassium: 4.4 mmol/L (ref 3.5–5.2)
Sodium: 137 mmol/L (ref 134–144)
Total Protein: 7 g/dL (ref 6.0–8.5)

## 2017-04-07 LAB — LIPID PANEL
CHOL/HDL RATIO: 5.6 ratio — AB (ref 0.0–4.4)
CHOLESTEROL TOTAL: 246 mg/dL — AB (ref 100–199)
HDL: 44 mg/dL (ref >39)
LDL CALC: 158 mg/dL — AB (ref 0–99)
TRIGLYCERIDES: 222 mg/dL — AB (ref 0–149)
VLDL CHOLESTEROL CAL: 44 mg/dL — AB (ref 5–40)

## 2017-05-03 ENCOUNTER — Telehealth: Payer: Self-pay | Admitting: Family Medicine

## 2017-05-10 ENCOUNTER — Other Ambulatory Visit: Payer: Self-pay

## 2017-05-10 ENCOUNTER — Telehealth: Payer: Self-pay

## 2017-05-10 DIAGNOSIS — I10 Essential (primary) hypertension: Secondary | ICD-10-CM

## 2017-05-10 MED ORDER — LOSARTAN POTASSIUM-HCTZ 50-12.5 MG PO TABS: 1.0000 | ORAL_TABLET | Freq: Every day | ORAL | 1 refills | Status: DC

## 2017-05-10 MED ORDER — LOSARTAN POTASSIUM-HCTZ 50-12.5 MG PO TABS: 1.0000 | ORAL_TABLET | Freq: Every day | ORAL | 0 refills | Status: DC

## 2017-05-10 NOTE — Telephone Encounter (Signed)
Pt called in wanting to get medication for hyzaar switched to [facility]. Cancelled 75mo from [place] and sent in new rx to [facility]. Sent 30 day supply to [facility] to have coverage during wait.

## 2017-05-12 ENCOUNTER — Other Ambulatory Visit: Payer: Self-pay | Admitting: Family Medicine

## 2017-05-12 DIAGNOSIS — F411 Generalized anxiety disorder: Secondary | ICD-10-CM

## 2017-05-12 NOTE — Telephone Encounter (Signed)
Copied from [place] #[id number]. Topic: Inquiry >> May 12, 2017  4:53 PM Oliver Pila B wrote: Reason for CRM: PT is calling b/c she is still calling about the Xanax Rx, she is needing it sent to [facility] but she was told that everything was ok concerning this but she has arrived at [facility] and nothing has been done, contact pt to advise about the situation

## 2017-05-14 MED ORDER — ALPRAZOLAM 2 MG PO TABS: 1.0000 mg | ORAL_TABLET | Freq: Three times a day (TID) | ORAL | 0 refills | Status: DC | PRN

## 2017-05-14 NOTE — Telephone Encounter (Signed)
Reordered/printed. Follow up in next month as planned from last OV.

## 2017-05-14 NOTE — Telephone Encounter (Signed)
Pt is requesting refill for Xanax.  Please advise.  Thank you!

## 2017-05-17 ENCOUNTER — Other Ambulatory Visit: Payer: Self-pay

## 2017-05-17 DIAGNOSIS — I10 Essential (primary) hypertension: Secondary | ICD-10-CM

## 2017-05-17 MED ORDER — LOSARTAN POTASSIUM-HCTZ 50-12.5 MG PO TABS: 1.0000 | ORAL_TABLET | Freq: Every day | ORAL | 3 refills | Status: DC

## 2017-05-17 MED ORDER — ALPRAZOLAM 2 MG PO TABS: 1.0000 mg | ORAL_TABLET | Freq: Three times a day (TID) | ORAL | 0 refills | Status: DC | PRN

## 2017-05-17 NOTE — Telephone Encounter (Signed)
Patient would like the office to fax her Rx for xanax to [facility]- she can't come to the office to pick it up. Please let her know if that can't be done. She wants the rest of her refills sent to the [facility]. She wants to know why she has to come in next month.

## 2017-05-17 NOTE — Telephone Encounter (Signed)
Now pt says that its a 30 day waiting period with [facility], she says she can not wait that long bc she is almost out.

## 2017-05-17 NOTE — Telephone Encounter (Addendum)
Xanax printed to send to [facility], in addition to one printed for local fill. At last visit in October we planned return in 1-2 months to discuss anxiety treatment (as we deferred any changes at that visit as it was a potentially difficult month for her).  If she would like me to refer her to psychiatry to manage anxiety and xanax prescription instead of return visit here, I can place a referral without a follow up next month if she prefers. Either way is fine with me.

## 2017-05-17 NOTE — Telephone Encounter (Signed)
Printed rx for hyzaar and faxed to [facility] per pt. Pt now requesting that we fill her xanax through [facility]. States we need to start process now because it will take 30 days to process request. Please advise.

## 2017-05-17 NOTE — Telephone Encounter (Signed)
Pharmacy received and patient made aware.

## 2017-05-17 NOTE — Addendum Note (Signed)
Addended by: Merri Ray R on: 05/17/2017 07:03 PM   Modules accepted: Orders

## 2017-05-17 NOTE — Telephone Encounter (Signed)
Patient did not get her MyChart message about her lab work. She would like a copy mailed to her when she gets her labs drawn.

## 2017-05-17 NOTE — Telephone Encounter (Signed)
Patient called and is very unhappy because she says she is getting the run around about this & that she is not coming in every month to see the Dr for a refill on xanex bc she has to pay $43 everytime. She wants the script faxed to [facility]. She said she called several times last week & she did. She does not want the [facility] to [facility], that is NOT her primary pharmacy.

## 2017-05-18 ENCOUNTER — Telehealth: Payer: Self-pay

## 2017-05-18 NOTE — Telephone Encounter (Signed)
Faxed rx for xanax to [facility]. Pt made aware.

## 2017-08-15 ENCOUNTER — Other Ambulatory Visit: Payer: Self-pay | Admitting: Family Medicine

## 2017-08-15 DIAGNOSIS — F411 Generalized anxiety disorder: Secondary | ICD-10-CM

## 2017-08-16 NOTE — Telephone Encounter (Signed)
LOV: 04/06/17 Dr. Merri Ray Pharmacy: [facility]

## 2017-08-17 NOTE — Telephone Encounter (Signed)
Please advise. Thank you

## 2017-08-17 NOTE — Telephone Encounter (Signed)
Per our discussion in 03/2017: "- discussed SSRi or other daily med in past, with planned follow up earlier this year. She is hesitant to start new med as above, but discussed potential risks with benzodiazepines, especially with any narcotics, but also discussed likely anxiety improvement with SSRI. Agreed to defer this discussion and change in meds for next 1-2 months as currently a difficult month for her.              -consider psychiatry eval if declined any med other than BDZ to make sure this is appropriate regimen."  I refilled meds for her in November, but see note regarding follow-up plan. She either needs to follow-up with me or with psychiatry. I did not see any other notes regarding that decision. I will refill Xanax again for now until we know the follow-up plan. Thanks.

## 2017-10-06 ENCOUNTER — Telehealth: Payer: Self-pay | Admitting: Family Medicine

## 2017-10-06 ENCOUNTER — Other Ambulatory Visit: Payer: Self-pay | Admitting: Family Medicine

## 2017-10-06 DIAGNOSIS — F411 Generalized anxiety disorder: Secondary | ICD-10-CM

## 2017-10-06 NOTE — Telephone Encounter (Signed)
Xanax 2 mg tablet refill request  LOV 04/06/17 with Dr. Carlota Raspberry.  [place], [place] [address]

## 2017-10-06 NOTE — Telephone Encounter (Signed)
Copied from [place] [id number]. Topic: General - Other >> Oct 06, 2017  2:24 PM Darl Householder, RMA wrote: Reason for CRM: Medication refill request for alprazolam Duanne Moron) 2 MG tablet to be sent to [facility]

## 2017-10-06 NOTE — Telephone Encounter (Signed)
Last OV: 04/06/17 PCP: [place]: [facility] [address] [place] - [address] [phone] (Phone) [phone] (Fax)

## 2017-10-11 NOTE — Telephone Encounter (Signed)
Patient is upset she was not contacted to let her know she needs an appointment. I scheduled her medication follow up for next Tuesday 04/30 at 2:00pm. And patient would like a call back to find out what to do if she runs out of xanax?

## 2017-10-12 NOTE — Telephone Encounter (Signed)
Pt needs follow-up no refills.

## 2017-10-12 NOTE — Telephone Encounter (Signed)
Pt. Is asking if Dr. Nyoka Cowden can call in a few pills until Friday 4/26. She is out and is having a hard time .

## 2017-10-15 ENCOUNTER — Ambulatory Visit (INDEPENDENT_AMBULATORY_CARE_PROVIDER_SITE_OTHER): Admitting: Family Medicine

## 2017-10-15 ENCOUNTER — Other Ambulatory Visit: Payer: Self-pay

## 2017-10-15 ENCOUNTER — Encounter: Payer: Self-pay | Admitting: Family Medicine

## 2017-10-15 VITALS — BP 128/72 | HR 95 | Temp 98.0°F | Ht 69.0 in | Wt 205.0 lb

## 2017-10-15 DIAGNOSIS — E785 Hyperlipidemia, unspecified: Secondary | ICD-10-CM | POA: Diagnosis not present

## 2017-10-15 DIAGNOSIS — R739 Hyperglycemia, unspecified: Secondary | ICD-10-CM

## 2017-10-15 DIAGNOSIS — F411 Generalized anxiety disorder: Secondary | ICD-10-CM | POA: Diagnosis not present

## 2017-10-15 DIAGNOSIS — I10 Essential (primary) hypertension: Secondary | ICD-10-CM | POA: Diagnosis not present

## 2017-10-15 DIAGNOSIS — M545 Low back pain, unspecified: Secondary | ICD-10-CM

## 2017-10-15 DIAGNOSIS — G8929 Other chronic pain: Secondary | ICD-10-CM

## 2017-10-15 MED ORDER — ALPRAZOLAM 2 MG PO TABS: ORAL_TABLET | ORAL | 0 refills | Status: DC

## 2017-10-15 MED ORDER — FLUOXETINE HCL 20 MG PO TABS: 20.0000 mg | ORAL_TABLET | Freq: Every day | ORAL | 1 refills | Status: DC

## 2017-10-15 MED ORDER — OXYCODONE-ACETAMINOPHEN 10-325 MG PO TABS: 1.0000 | ORAL_TABLET | Freq: Every day | ORAL | 0 refills | Status: DC | PRN

## 2017-10-15 MED ORDER — CYCLOBENZAPRINE HCL 5 MG PO TABS: 5.0000 mg | ORAL_TABLET | Freq: Three times a day (TID) | ORAL | 0 refills | Status: DC | PRN

## 2017-10-15 MED ORDER — LOSARTAN POTASSIUM-HCTZ 50-12.5 MG PO TABS: 1.0000 | ORAL_TABLET | Freq: Every day | ORAL | 1 refills | Status: DC

## 2017-10-15 NOTE — Progress Notes (Addendum)
Subjective:  By signing my name below, I, Moises Blood, attest that this documentation has been prepared under the direction and in the presence of Merri Ray, MD. Electronically Signed: Moises Blood, [place]. 10/15/2017 , 2:15 PM .  Patient was seen in Room 1 .   Patient ID: Madison West, female    DOB: 08-10-1955, 62 y.o.   MRN: [id number] Chief Complaint  Patient presents with   Medication Refill    Would like all medication to go to [facility] for Oxycodone   [facility] Chondition    Follow up    HPI Madison West is a 62 y.o. female Here for follow up of medications. She is fasting today.   HTN She takes losartan-HCT 50-12.5mg  QD.   BP Readings from Last 3 Encounters:  10/15/17 128/72  04/06/17 124/76  08/25/16 110/72   Lab Results  Component Value Date   CREATININE 0.73 04/06/2017   Cardiologist is Dr. Einar Gip, with history of SVT. She had an echo and stress test, with decreased exercise tolerance, mild aortic stenosis, no evidence of ischemia. Planned for observation in regard to SVT. Readings were stable last visit, continued on same dose Losartan-HCT.   Patient states she's doing well on Losartan-HCT, and denies side effects with them. She is scheduled to see cardiology next Friday. She tries to walk with her daughter a few days in a week; and also notes being physical at work.   She mentions having occasional dizziness with heart palpitations.   Hyperglycemia Her glucose was 109 at last visit, plan on recheck in 3 months at Oct 2018 visit.    Hyperlipidemia Lab Results  Component Value Date   CHOL 246 (H) 04/06/2017   HDL 44 04/06/2017   LDLCALC 158 (H) 04/06/2017   TRIG 222 (H) 04/06/2017   CHOLHDL 5.6 (H) 04/06/2017   Lab Results  Component Value Date   ALT 16 04/06/2017   AST 16 04/06/2017   ALKPHOS 66 04/06/2017   BILITOT 0.4 04/06/2017   The 10-year ASCVD risk score Mikey Bussing DC Jr., et al., 2013) is: 8.4%   Values used to  calculate the score:     Age: 2 years     Sex: Female     Is Non-Hispanic African American: No     Diabetic: No     Tobacco smoker: No     Systolic Blood Pressure: 562 mmHg     Is BP treated: Yes     HDL Cholesterol: 44 mg/dL     Total Cholesterol: 246 mg/dL  Option of diet changes and activity with repeat testing in 6 months.    Generalized anxiety disorder See previous office visits. We have discussed multiple times of use of SSRI as she had been dependent on chronic benzodiazepines only with xanax 2mg , taking half int he morning, half in the afternoon and 1.5 tablets at night. She has not seen a therapist in over 2 years, reported due to cost. Discussed risk of benzodiazepines and use of narcotic pain medication for her back pain and hip pain. She intermittently took the oxycodone in the past for those issues. #30 oxycodone prescribed in Oct 2018. At her last visit, she was having a difficulty month as it was the 10-year anniversary of her husband's death. She refused SSRI again as starting new medications caused her anxiety. We discussed deferring SSRI for an additional 1-2 months with plan to follow up. She also discussed need to psychiatry evaluation if she was unable to take  other medications beside benzodiazepine to make sure that was appropriate regime for her anxiety. This is her first visit back with me since Oct 2018. Last refill #90 alprazolam on Feb 26th.   Patient reports she was in the hospital for 30 days in 1985. She was placed on an antidepressant and xanax. But, she came off the antidepressant after discharge from hospital because new medications gave her anxiety. She notes weaning herself off xanax, and was completely off for about 10 years. She hasn't been able to see a therapist due to cost. Patient states her friend suggested her to try Prozac as she's been on it for a while and doing well on it, with xanax for breakthrough anxiety. She denies SI or HI.    Chronic low  back pain See above. She is taking flexeril or oxycodone for intermittent flares only. Discussed to not combine that with benzodiazepine due to risk of overdose in the past.   She reports she hasn't been taking oxycodone right now as she ran out. She hasn't been taking it often, and denies taking it with benzodiazepines.   Patient Active Problem List   Diagnosis Date Noted   Genetic testing 03/01/2015   Breast cancer of lower-outer quadrant of right female breast ([place]) 05/25/2013   Sciatica of right side 10/19/2012   CRPS (complex regional pain syndrome), upper limb 10/19/2012   History of DVT (deep vein thrombosis) 10/05/2011   Hypertension 10/04/2011   Anxiety 10/04/2011   Depression 10/04/2011   Personal history of vulvar dysplasia 08/15/2011   Grade 3 vulvar intraepithelial neoplasia 06/30/2011   S/P breast lumpectomy    Past Medical History:  Diagnosis Date   Anxiety    Arthritis    low back and hip pain intermittent   Breast cancer ([place]) 06/02/07   r breast -surgery ,radiaology. chemotherapy   Carpal tunnel syndrome    right hand   Colon polyps    Complication of anesthesia    Fentanyl, Versed-makes extra hyper, bradycardia x 1 episode in past   Depression    Dysplasia of vulva    Hypertension    S/P breast lumpectomy 07/04/07   R breast   S/P radiation therapy 2009   Past Surgical History:  Procedure Laterality Date   APPENDECTOMY     age 38   CESAREAN SECTION     x 2   COLONOSCOPY W/ POLYPECTOMY     COLONOSCOPY WITH PROPOFOL N/A 04/27/2016   Procedure: COLONOSCOPY WITH PROPOFOL;  Surgeon: Garlan Fair, MD;  Location: [facility];  Service: Endoscopy;  Laterality: N/A;   DILATION AND CURETTAGE OF UTERUS     multiple   INTRAUTERINE DEVICE INSERTION     IUD REMOVAL     MASTECTOMY PARTIAL / LUMPECTOMY W/ AXILLARY LYMPHADENECTOMY     TUBAL LIGATION     VULVA SURGERY     Multiple times for dysplasia   Allergies  Allergen  Reactions   Prednisone Other (See Comments)   Fentanyl Other (See Comments)    MAKES SUPER HYPER PER PT   Midazolam Anxiety   Vicodin [Hydrocodone-Acetaminophen] Anxiety    Extreme anxiety.   Prior to Admission medications   Medication Sig Start Date End Date Taking? Authorizing Provider  alprazolam (XANAX) 2 MG tablet TAKE 1/2 TO 1 (ONE-HALF TO ONE) TABLET BY MOUTH THREE TIMES DAILY AS NEEDED FOR ANXIETY 08/17/17   [name], MD  Calcium Carbonate-Vitamin D (CALCIUM + D PO) Take 600 mg by mouth daily.  [provider]  cyclobenzaprine (FLEXERIL) 10 MG tablet Take 1 tablet (10 mg total) by mouth at bedtime. 04/06/17   [name], MD  losartan-hydrochlorothiazide (HYZAAR) 50-12.5 MG tablet Take 1 tablet by mouth daily. 05/17/17   [name], MD  oxyCODONE-acetaminophen (PERCOCET) 10-325 MG tablet Take 1 tablet by mouth daily as needed for pain. To take one tablet once or twice daily as needed for back and hip pain. 04/06/17   [name], MD   Social History   Socioeconomic History   Marital status: Widowed    Spouse name: Not on file   Number of children: 2   Years of education: Not on file   Highest education level: Not on file  Occupational History   Occupation: Metallurgist: [employer] resource strain: Not on file   Food insecurity:    Worry: Not on file    Inability: Not on file   Transportation needs:    Medical: Not on file    Non-medical: Not on file  Tobacco Use   Smoking status: Former Smoker    Packs/day: 0.75    Years: 20.00    Pack years: 15.00    Types: Cigarettes    Last attempt to quit: 05/06/1997    Years since quitting: 20.4   Smokeless tobacco: Never Used  Substance and Sexual Activity   Alcohol use: Yes    Alcohol/week: 0.0 oz    Comment: 2 daily   Drug use: No   Sexual activity: Never    Birth control/protection: Abstinence  Lifestyle   Physical  activity:    Days per week: Not on file    Minutes per session: Not on file   Stress: Not on file  Relationships   Social connections:    Talks on phone: Not on file    Gets together: Not on file    Attends religious service: Not on file    Active member of club or organization: Not on file    Attends meetings of clubs or organizations: Not on file    Relationship status: Not on file   Intimate partner violence:    Fear of current or ex partner: Not on file    Emotionally abused: Not on file    Physically abused: Not on file    Forced sexual activity: Not on file  Other Topics Concern   Not on file  Social History Narrative   Not on file   Review of Systems  Constitutional: Negative for fatigue and unexpected weight change.  Respiratory: Negative for chest tightness and shortness of breath.   Cardiovascular: Positive for palpitations. Negative for chest pain and leg swelling.  Gastrointestinal: Negative for abdominal pain and blood in stool.  Neurological: Positive for dizziness. Negative for syncope, light-headedness and headaches.  Psychiatric/Behavioral: The patient is nervous/anxious.        Objective:   Physical Exam  Constitutional: She is oriented to person, place, and time. She appears well-developed and well-nourished.  HENT:  Head: Normocephalic and atraumatic.  Eyes: Pupils are equal, round, and reactive to light. Conjunctivae and EOM are normal.  Neck: Carotid bruit is not present.  Cardiovascular: Normal rate, regular rhythm, normal heart sounds and intact distal pulses.  Pulmonary/Chest: Effort normal and breath sounds normal.  Abdominal: Soft. She exhibits no pulsatile midline mass. There is no tenderness.  Neurological: She is alert and oriented to person, place, and time.  Skin: Skin is  warm and dry.  Psychiatric: She has a normal mood and affect. Her behavior is normal.  Vitals reviewed.   Vitals:   10/15/17 1332  BP: 128/72  Pulse: 95  Temp:  98 F (36.7 C)  TempSrc: Oral  SpO2: 95%  Weight: 205 lb (93 kg)  Height: 5\' 9"  (1.753 m)       Assessment & Plan:   Madison West is a 62 y.o. female Essential hypertension - Plan: Comprehensive metabolic panel, losartan-hydrochlorothiazide (HYZAAR) 50-12.5 MG tablet  -Stable, continue same dose Hyzaar, CMP pending  Hyperlipidemia, unspecified hyperlipidemia type - Plan: Comprehensive metabolic panel, Lipid panel  -Previous elevated, repeat labs to determine if medication indicated.  Hyperglycemia - Plan: Hemoglobin A1c  -Check A1c to screen for diabetes.  Previously elevated glucose.  Left-sided low back pain without sciatica, unspecified chronicity - Plan: oxyCODONE-acetaminophen (PERCOCET) 10-325 MG tablet, cyclobenzaprine (FLEXERIL) 5 MG tablet Spent over 15 minutes discussing specifically her anxiety treatment.  We discussed multiple times in the past chronic low back pain, unspecified back pain laterality, with sciatica presence unspecified - Plan: oxyCODONE-acetaminophen (PERCOCET) 10-325 MG tablet, cyclobenzaprine (FLEXERIL) 5 MG tablet  -Intermittent back pain, rare use of Percocet, rare use of Flexeril.  Refilled both for as needed use only as not chronic/frequent use.  Potential side effects of both were discussed and additive side effects discussed.  Cautioned against use with other sedating medications, and again stressed the importance of not taking narcotic pain medication if she is taking a benzodiazepine.  Understanding expressed.  Generalized anxiety disorder - Plan: alprazolam (XANAX) 2 MG tablet, FLUoxetine (PROZAC) 20 MG tablet, DISCONTINUED: alprazolam (XANAX) 2 MG tablet, DISCONTINUED: alprazolam (XANAX) 2 MG tablet  -Concerns of just benzodiazepine with chronic daily use and lack of SSRI or recent cognitive behavioral therapy/counseling discussed again.  She did agree to try fluoxetine, potential side effects and initial side effects discussed but also  expectations that would improve her anxiety and decrease need for benzodiazepine, especially with concerns of her other sedating medications.  If unable to tolerate SSRI or she does not take SSRI, discussed importance of follow-up with psychiatry if chronic benzodiazepine use only for treatment of anxiety.  Understanding expressed.  -30-day supply of Xanax provided locally, and sent other prescription to the [place] as 90-day prescription through [facility] is less costly for her.  However plan on follow-up visit in the next 3 to 4 weeks to determine efficacy of SSRI at that time  Meds ordered this encounter  Medications   oxyCODONE-acetaminophen (PERCOCET) 10-325 MG tablet    Sig: Take 1 tablet by mouth daily as needed for pain. To take one tablet once or twice daily as needed for back and hip pain.    Dispense:  30 tablet    Refill:  0   losartan-hydrochlorothiazide (HYZAAR) 50-12.5 MG tablet    Sig: Take 1 tablet by mouth daily.    Dispense:  90 tablet    Refill:  1   cyclobenzaprine (FLEXERIL) 5 MG tablet    Sig: Take 1-2 tablets (5-10 mg total) by mouth 3 (three) times daily as needed for muscle spasms.    Dispense:  30 tablet    Refill:  0   DISCONTD: alprazolam (XANAX) 2 MG tablet    Sig: TAKE 1/2 TO 1 (ONE-HALF TO ONE) TABLET BY MOUTH THREE TIMES DAILY AS NEEDED FOR ANXIETY    Dispense:  90 tablet    Refill:  0   DISCONTD: alprazolam (XANAX) 2 MG tablet  Sig: TAKE 1/2 TO 1 (ONE-HALF TO ONE) TABLET BY MOUTH THREE TIMES DAILY AS NEEDED FOR ANXIETY    Dispense:  270 tablet    Refill:  0    To be dispensed after 11/05/17   alprazolam (XANAX) 2 MG tablet    Sig: TAKE 1/2 TO 1 (ONE-HALF TO ONE) TABLET BY MOUTH THREE TIMES DAILY AS NEEDED FOR ANXIETY    Dispense:  270 tablet    Refill:  0    To be dispensed after 11/05/17   FLUoxetine (PROZAC) 20 MG tablet    Sig: Take 1 tablet (20 mg total) by mouth daily.    Dispense:  90 tablet    Refill:  1   Patient Instructions    I  would recommend starting Prozac once per day for anxiety treatment.  Xanax need should decrease once Prozac is at effective dose.  We may need to increase Prozac dose in 6 to 8 weeks if not seeing significant improvement at that level.  Please follow-up in 3 to 4 weeks so we can discuss that medication further.  I did send a refill to [facility] for the 90-day supply of Xanax, but expect that to be lessening in need as Prozac helps for treatment of anxiety.  If you are unable to tolerate an SSRI, I will need you to meet with mental health provider/psychiatrist to discuss your medication further.  For mental health provider, I would ask [place] what may be best option locally for therapist and psychiatric care provider to see if one may be more cost effective.  Cholesterol was slightly elevated last visit.  I will check those levels again today as well as a blood sugar test as your blood sugar was slightly elevated last time.  No change in blood pressure medication for now.  For episodic back pain, I did refill Flexeril and oxycodone, use one or the other, ideally Flexeril if milder symptoms at 5 mg dose.  Do not combine sedating medications if at all possible and do not combine oxycodone with Xanax at all.   Return to the clinic or go to the nearest emergency room if any of your symptoms worsen or new symptoms occur.   IF you received an x-ray today, you will receive an invoice from [facility]. Please contact [facility] at [phone] with questions or concerns regarding your invoice.   IF you received labwork today, you will receive an invoice from [place]. Please contact [facility] at [phone] with questions or concerns regarding your invoice.   Our billing staff will not be able to assist you with questions regarding bills from these companies.  You will be contacted with the lab results as soon as they are available. The fastest way to get your results is to activate your My  Chart account. Instructions are located on the last page of this paperwork. If you have not heard from [place] regarding the results in 2 weeks, please contact this office.       I personally performed the services described in this documentation, which was scribed in my presence. The recorded information has been reviewed and considered for accuracy and completeness, addended by me as needed, and agree with information above.  Signed,   Merri Ray, MD [facility] [place].  10/17/17 9:25 PM

## 2017-10-15 NOTE — Patient Instructions (Addendum)
  I would recommend starting Prozac once per day for anxiety treatment.  Xanax need should decrease once Prozac is at effective dose.  We may need to increase Prozac dose in 6 to 8 weeks if not seeing significant improvement at that level.  Please follow-up in 3 to 4 weeks so we can discuss that medication further.  I did send a refill to [facility] for the 90-day supply of Xanax, but expect that to be lessening in need as Prozac helps for treatment of anxiety.  If you are unable to tolerate an SSRI, I will need you to meet with mental health provider/psychiatrist to discuss your medication further.  For mental health provider, I would ask [place] what may be best option locally for therapist and psychiatric care provider to see if one may be more cost effective.  Cholesterol was slightly elevated last visit.  I will check those levels again today as well as a blood sugar test as your blood sugar was slightly elevated last time.  No change in blood pressure medication for now.  For episodic back pain, I did refill Flexeril and oxycodone, use one or the other, ideally Flexeril if milder symptoms at 5 mg dose.  Do not combine sedating medications if at all possible and do not combine oxycodone with Xanax at all.   Return to the clinic or go to the nearest emergency room if any of your symptoms worsen or new symptoms occur.   IF you received an x-ray today, you will receive an invoice from [facility]. Please contact [facility] at [phone] with questions or concerns regarding your invoice.   IF you received labwork today, you will receive an invoice from [place]. Please contact [facility] at [phone] with questions or concerns regarding your invoice.   Our billing staff will not be able to assist you with questions regarding bills from these companies.  You will be contacted with the lab results as soon as they are available. The fastest way to get your results is to activate  your My Chart account. Instructions are located on the last page of this paperwork. If you have not heard from [place] regarding the results in 2 weeks, please contact this office.

## 2017-10-16 LAB — COMPREHENSIVE METABOLIC PANEL
ALT: 15 IU/L (ref 0–32)
AST: 17 IU/L (ref 0–40)
Albumin/Globulin Ratio: 1.8 (ref 1.2–2.2)
Albumin: 4.3 g/dL (ref 3.6–4.8)
Alkaline Phosphatase: 67 IU/L (ref 39–117)
BUN/Creatinine Ratio: 17 (ref 12–28)
BUN: 11 mg/dL (ref 8–27)
Bilirubin Total: 0.5 mg/dL (ref 0.0–1.2)
CALCIUM: 9.7 mg/dL (ref 8.7–10.3)
CO2: 24 mmol/L (ref 20–29)
CREATININE: 0.65 mg/dL (ref 0.57–1.00)
Chloride: 100 mmol/L (ref 96–106)
GFR calc Af Amer: 109 mL/min/{1.73_m2} (ref >59)
GFR, EST NON AFRICAN AMERICAN: 94 mL/min/{1.73_m2} (ref >59)
Globulin, Total: 2.4 g/dL (ref 1.5–4.5)
Glucose: 95 mg/dL (ref 65–99)
Potassium: 4.3 mmol/L (ref 3.5–5.2)
Sodium: 140 mmol/L (ref 134–144)
Total Protein: 6.7 g/dL (ref 6.0–8.5)

## 2017-10-16 LAB — LIPID PANEL
CHOL/HDL RATIO: 5.8 ratio — AB (ref 0.0–4.4)
CHOLESTEROL TOTAL: 255 mg/dL — AB (ref 100–199)
HDL: 44 mg/dL (ref >39)
LDL CALC: 178 mg/dL — AB (ref 0–99)
TRIGLYCERIDES: 167 mg/dL — AB (ref 0–149)
VLDL Cholesterol Cal: 33 mg/dL (ref 5–40)

## 2017-10-16 LAB — HEMOGLOBIN A1C
Est. average glucose Bld gHb Est-mCnc: 120 mg/dL
Hgb A1c MFr Bld: 5.8 % — ABNORMAL HIGH (ref 4.8–5.6)

## 2017-10-17 ENCOUNTER — Encounter: Payer: Self-pay | Admitting: Family Medicine

## 2017-10-19 ENCOUNTER — Ambulatory Visit: Admitting: Family Medicine

## 2018-01-18 ENCOUNTER — Encounter (HOSPITAL_BASED_OUTPATIENT_CLINIC_OR_DEPARTMENT_OTHER): Payer: Self-pay | Admitting: Emergency Medicine

## 2018-01-18 ENCOUNTER — Other Ambulatory Visit: Payer: Self-pay

## 2018-01-18 ENCOUNTER — Emergency Department (HOSPITAL_BASED_OUTPATIENT_CLINIC_OR_DEPARTMENT_OTHER)

## 2018-01-18 ENCOUNTER — Emergency Department (HOSPITAL_BASED_OUTPATIENT_CLINIC_OR_DEPARTMENT_OTHER)
Admission: EM | Admit: 2018-01-18 | Discharge: 2018-01-19 | Disposition: A | Discharge status: Home / Self Care | Attending: Emergency Medicine | Admitting: Emergency Medicine

## 2018-01-18 DIAGNOSIS — Z87891 Personal history of nicotine dependence: Secondary | ICD-10-CM | POA: Insufficient documentation

## 2018-01-18 DIAGNOSIS — I10 Essential (primary) hypertension: Secondary | ICD-10-CM | POA: Insufficient documentation

## 2018-01-18 DIAGNOSIS — Z853 Personal history of malignant neoplasm of breast: Secondary | ICD-10-CM | POA: Insufficient documentation

## 2018-01-18 DIAGNOSIS — R000 Tachycardia, unspecified: Secondary | ICD-10-CM | POA: Diagnosis not present

## 2018-01-18 DIAGNOSIS — Z79899 Other long term (current) drug therapy: Secondary | ICD-10-CM | POA: Insufficient documentation

## 2018-01-18 NOTE — ED Triage Notes (Signed)
Pt states that she hasnt felt right the past few days. Pt states she felt that her racing earlier today. States that she felt it hasnt went away. Went to [facility] and they did an EKG and gave her the strip. Stated she had episode a year ago where they had to give her adenosine

## 2018-01-18 NOTE — ED Notes (Signed)
2345 went to get pt; RN Charmian Muff wanted to do EKG first; will call when ready to go to X-ray

## 2018-01-19 LAB — CBC
HEMATOCRIT: 47.1 % — AB (ref 36.0–46.0)
Hemoglobin: 16.6 g/dL — ABNORMAL HIGH (ref 12.0–15.0)
MCH: 32.2 pg (ref 26.0–34.0)
MCHC: 35.2 g/dL (ref 30.0–36.0)
MCV: 91.3 fL (ref 78.0–100.0)
PLATELETS: 438 10*3/uL — AB (ref 150–400)
RBC: 5.16 MIL/uL — ABNORMAL HIGH (ref 3.87–5.11)
RDW: 13.3 % (ref 11.5–15.5)
WBC: 13.4 10*3/uL — AB (ref 4.0–10.5)

## 2018-01-19 LAB — BASIC METABOLIC PANEL
Anion gap: 11 (ref 5–15)
BUN: 11 mg/dL (ref 8–23)
CO2: 27 mmol/L (ref 22–32)
CREATININE: 0.7 mg/dL (ref 0.44–1.00)
Calcium: 9.2 mg/dL (ref 8.9–10.3)
Chloride: 95 mmol/L — ABNORMAL LOW (ref 98–111)
GFR calc Af Amer: >60 mL/min (ref >60)
Glucose, Bld: 115 mg/dL — ABNORMAL HIGH (ref 70–99)
Potassium: 3.5 mmol/L (ref 3.5–5.1)
SODIUM: 133 mmol/L — AB (ref 135–145)

## 2018-01-19 LAB — TROPONIN I: Troponin I: <0.03 ng/mL (ref <0.03)

## 2018-01-19 LAB — TSH: TSH: 3.556 u[IU]/mL (ref 0.350–4.500)

## 2018-01-19 LAB — D-DIMER, QUANTITATIVE: D-Dimer, Quant: 0.27 ug/mL-FEU (ref 0.00–0.50)

## 2018-01-19 MED ORDER — LORAZEPAM 2 MG/ML IJ SOLN
1.0000 mg | Freq: Once | INTRAMUSCULAR | Status: DC
  Filled 2018-01-19: qty 1

## 2018-01-19 MED ORDER — ALPRAZOLAM 0.5 MG PO TABS
1.0000 mg | ORAL_TABLET | Freq: Once | ORAL | Status: AC
  Administered 2018-01-19: 1 mg via ORAL
  Filled 2018-01-19: qty 2

## 2018-01-19 MED ORDER — SODIUM CHLORIDE 0.9 % IV BOLUS
1000.0000 mL | Freq: Once | INTRAVENOUS | Status: AC
  Administered 2018-01-19: 1000 mL via INTRAVENOUS

## 2018-01-19 MED ORDER — METOPROLOL TARTRATE 5 MG/5ML IV SOLN
5.0000 mg | Freq: Once | INTRAVENOUS | Status: AC
  Administered 2018-01-19: 5 mg via INTRAVENOUS
  Filled 2018-01-19: qty 5

## 2018-01-19 NOTE — Discharge Instructions (Addendum)
You were seen today for high heart rate.  You were found to be in sinus tachycardia.  Your work-up is largely reassuring.  Your thyroid testing is still pending.  Take your medications at home as prescribed.  You did not want any heart medications.  Follow-up closely with cardiology.  You may be a candidate for metoprolol or other beta-blocker.

## 2018-01-19 NOTE — ED Provider Notes (Signed)
[facility] EMERGENCY DEPARTMENT Provider Note   CSN: [id number] Arrival date & time: 01/18/18  2323     History   Chief Complaint Chief Complaint  Patient presents with  . Tachycardia    HPI Madison West is a 62 y.o. female.  HPI  This is a 63 year old female who presents with palpitations.  Patient with a history of SVT, hypertension, generalized anxiety disorder.  Reports that she has not felt well in general over the last several days.  On her way home from work she felt palpitations.  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  She has not taken her nightly Xanax.  Otherwise she reports taking her medications as prescribed.  She reports palpitations and some pressure that radiates into her jaw.  Currently she rates her pressure at 5 out of 10.  No known history of heart disease.  She missed her cardiology follow-up in May.  Patient denies any increase caffeine use, changes in medications.  She reports that she feels that she is staying hydrated.  No recent illnesses or fevers.  Past Medical History:  Diagnosis Date  . Anxiety   . Arthritis    low back and hip pain intermittent  . Breast cancer ([place]) 06/02/07   r breast -surgery ,radiaology. chemotherapy  . Carpal tunnel syndrome    right hand  . Colon polyps   . Complication of anesthesia    Fentanyl, Versed-makes extra hyper, bradycardia x 1 episode in past  . Depression   . Dysplasia of vulva   . Hypertension   . S/P breast lumpectomy 07/04/07   R breast  . S/P radiation therapy 2009    Patient Active Problem List   Diagnosis Date Noted  . Genetic testing 03/01/2015  . Breast cancer of lower-outer quadrant of right female breast ([place]) 05/25/2013  . Sciatica of right side 10/19/2012  . CRPS (complex regional pain syndrome), upper limb 10/19/2012  . History of DVT (deep vein thrombosis) 10/05/2011  . Hypertension 10/04/2011  . Anxiety 10/04/2011  . Depression 10/04/2011    . Personal history of vulvar dysplasia 08/15/2011  . 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 S/P breast lumpectomy     Past Surgical History:  Procedure Laterality Date  . APPENDECTOMY     age 48  . CESAREAN SECTION     x 2  . COLONOSCOPY W/ POLYPECTOMY    . COLONOSCOPY WITH PROPOFOL N/A 04/27/2016   Procedure: COLONOSCOPY WITH PROPOFOL;  Surgeon: Garlan Fair, MD;  Location: [facility];  Service: Endoscopy;  Laterality: N/A;  . DILATION AND CURETTAGE OF UTERUS     multiple  . INTRAUTERINE DEVICE INSERTION    . IUD REMOVAL    . MASTECTOMY PARTIAL / LUMPECTOMY W/ AXILLARY LYMPHADENECTOMY    . TUBAL LIGATION    . VULVA SURGERY     Multiple times for dysplasia     OB History   None      Home Medications    Prior to Admission medications   Medication Sig Start Date End Date Taking? Authorizing Provider  alprazolam (XANAX) 2 MG tablet TAKE 1/2 TO 1 (ONE-HALF TO ONE) TABLET BY MOUTH THREE TIMES DAILY AS NEEDED FOR ANXIETY 10/15/17   [name], MD  Calcium Carbonate-Vitamin D (CALCIUM + D PO) Take 600 mg by mouth daily.      [provider]  cyclobenzaprine (FLEXERIL) 5 MG tablet Take 1-2 tablets (5-10 mg total) by  mouth 3 (three) times daily as needed for muscle spasms. 10/15/17   [name], MD  FLUoxetine (PROZAC) 20 MG tablet Take 1 tablet (20 mg total) by mouth daily. 10/15/17   [name], MD  losartan-hydrochlorothiazide (HYZAAR) 50-12.5 MG tablet Take 1 tablet by mouth daily. 10/15/17   [name], MD  oxyCODONE-acetaminophen (PERCOCET) 10-325 MG tablet Take 1 tablet by mouth daily as needed for pain. To take one tablet once or twice daily as needed for back and hip pain. 10/15/17   [name], MD    Family History Family History  Problem Relation Age of Onset  . Colon cancer Mother 55  . Heart disease Father        heart attack  . Colon polyps Brother        one brother had large colon polyp that needed  surgery to be removed  . Heart disease Paternal Grandfather     Social History Social History   Tobacco Use  . Smoking status: Former Smoker    Packs/day: 0.75    Years: 20.00    Pack years: 15.00    Types: Cigarettes    Last attempt to quit: 05/06/1997    Years since quitting: 20.7  . Smokeless tobacco: Never Used  Substance Use Topics  . Alcohol use: Yes    Alcohol/week: 0.0 oz    Comment: 2 daily  . Drug use: No     Allergies   Prednisone; Fentanyl; Midazolam; and Vicodin [hydrocodone-acetaminophen]   Review of Systems Review of Systems  Constitutional: Negative for fever.  Respiratory: Positive for chest tightness. Negative for shortness of breath.   Cardiovascular: Positive for palpitations. Negative for chest pain and leg swelling.  Gastrointestinal: Negative for abdominal pain, nausea and vomiting.  Genitourinary: Negative for dysuria.  All other systems reviewed and are negative.    Physical Exam Updated Vital Signs BP (!) 171/102   Pulse (!) 30   Temp 98 F (36.7 C) (Oral)   Resp 16   Ht 5\' 9"  (1.753 m)   Wt 90.7 kg (200 lb)   SpO2 94%   BMI 29.53 kg/m   Physical Exam  Constitutional: She is oriented to person, place, and time. She appears well-developed and well-nourished.  Anxious appearing, nontoxic  HENT:  Head: Normocephalic and atraumatic.  Eyes: Pupils are equal, round, and reactive to light.  Neck: Neck supple.  Cardiovascular: Regular rhythm and normal heart sounds.  No murmur heard. Tachycardia  Pulmonary/Chest: Effort normal and breath sounds normal. No respiratory distress. She has no wheezes.  Abdominal: Soft. Bowel sounds are normal. There is no tenderness. There is no guarding.  Musculoskeletal: She exhibits no edema or tenderness.  Neurological: She is alert and oriented to person, place, and time.  Skin: Skin is warm and dry.  Psychiatric: She has a normal mood and affect.  Nursing note and vitals reviewed.    ED  Treatments / Results  Labs (all labs ordered are listed, but only abnormal results are displayed) Labs Reviewed  BASIC METABOLIC PANEL - Abnormal; Notable for the following components:      Result Value   Sodium 133 (*)    Chloride 95 (*)    Glucose, Bld 115 (*)    All other components within normal limits  CBC - Abnormal; Notable for the following components:   WBC 13.4 (*)    RBC 5.16 (*)    Hemoglobin 16.6 (*)    HCT 47.1 (*)  Platelets 438 (*)    All other components within normal limits  TROPONIN I  D-DIMER, QUANTITATIVE (NOT AT [facility])  TSH    EKG EKG Interpretation  Date/Time:  Tuesday January 18 2018 23:50:28 EDT Ventricular Rate:  124 PR Interval:    QRS Duration: 88 QT Interval:  353 QTC Calculation: 507 R Axis:   -53 Text Interpretation:  Ectopic atrial tachycardia, unifocal Left anterior fascicular block Abnormal R-wave progression, early transition Consider anterior infarct Prolonged QT interval Confirmed by Thayer Jew [phone]) on 01/19/2018 12:00:40 AM   EKG Interpretation  Date/Time:  Wednesday January 19 2018 02:46:21 EDT Ventricular Rate:  94 PR Interval:    QRS Duration: 87 QT Interval:  383 QTC Calculation: 479 R Axis:   -50 Text Interpretation:  Sinus rhythm Second deg AVB, Mobitz I (Wenckebach) Right atrial enlargement Abnormal R-wave progression, early transition Inferior infarct, old Consider anterior infarct Confirmed by Thayer Jew [phone]) on 01/19/2018 2:54:44 AM        Radiology Dg Chest 2 View  Result Date: 01/19/2018 CLINICAL DATA:  Tachycardia. EXAM: CHEST - 2 VIEW COMPARISON:  Radiograph 08/25/2016 FINDINGS: The cardiomediastinal contours are unchanged with mild aortic tortuosity. Mild left basilar scarring. Pulmonary vasculature is normal. No consolidation, pleural effusion, or pneumothorax. No acute osseous abnormalities are seen. Surgical clips in the right axilla. IMPRESSION: Lingular scarring.  No acute findings. Electronically  Signed   By: Jeb Levering M.D.   On: 01/19/2018 00:27    Procedures Procedures (including critical care time)  Medications Ordered in ED Medications  LORazepam (ATIVAN) injection 1 mg (1 mg Intravenous Not Given 01/19/18 0041)  sodium chloride 0.9 % bolus 1,000 mL (1,000 mLs Intravenous New Bag/Given 01/19/18 0040)  ALPRAZolam (XANAX) tablet 1 mg (1 mg Oral Given 01/19/18 0048)  metoprolol tartrate (LOPRESSOR) injection 5 mg (5 mg Intravenous Given 01/19/18 0210)     Initial Impression / Assessment and Plan / ED Course  I have reviewed the triage vital signs and the nursing notes.  Pertinent labs & imaging results that were available during my care of the patient were reviewed by me and considered in my medical decision making (see chart for details).     Patient presents with palpitations.  Noted to be tachycardic in the 120s.  It appears to be sinus tachycardia.  No indication of SVT or arrhythmia at this time.  She is overall nontoxic-appearing.  She does appear very anxious.  Patient was given fluids and her nightly Xanax.  Lab work obtained including d-dimer, troponin, thyroid studies.  D-dimer is negative.  Troponin is negative.  Doubt ACS or PE.  TSH is pending.  After 1 L of fluid, patient still has a heart rate in the 110s.  I have reviewed her chart.  Normally her heart rate is in the 90s.  She still appears very anxious.  Patient was given 5 mg IV metoprolol.  Following this, heart rate now in the 80s to 90s.  Patient on recheck, is very anxious.  She states "the monitor says I am in A. fib."  It does not appear that she is in A. fib.  I did request repeat EKG.  She is now in sinus rhythm with a Mobitz 1 block.  This may be related to the metoprolol.  Unclear etiology of tachycardia.  Likely multifactorial.  She is anxious on multiple re-evaluations and heart rate increases when I am in the room.  She does not appear to be in thyroid storm although thyroid  testing is still pending.   She is not febrile.  Her lab work does suggest that she was mildly dehydrated with hemoconcentration increased white count and hemoglobin.  She was given fluids.  We will follow-up on thyroid testing.  Given that she went into Mobitz type I, will defer to cardiology for whether or not patient is a good candidate for beta blockade in the future.  Recommend that she follow-up closely with cardiology.  After history, exam, and medical workup I feel the patient has been appropriately medically screened and is safe for discharge home. Pertinent diagnoses were discussed with the patient. Patient was given return precautions.   Final Clinical Impressions(s) / ED Diagnoses   Final diagnoses:  Tachycardia    ED Discharge Orders    None       Nihaal Friesen, Barbette Hair, MD 01/19/18 [phone]

## 2018-02-19 ENCOUNTER — Encounter (HOSPITAL_BASED_OUTPATIENT_CLINIC_OR_DEPARTMENT_OTHER): Payer: Self-pay | Admitting: Adult Health

## 2018-02-19 ENCOUNTER — Emergency Department (HOSPITAL_BASED_OUTPATIENT_CLINIC_OR_DEPARTMENT_OTHER)
Admission: EM | Admit: 2018-02-19 | Discharge: 2018-02-19 | Disposition: A | Discharge status: Home / Self Care | Attending: Emergency Medicine | Admitting: Emergency Medicine

## 2018-02-19 ENCOUNTER — Other Ambulatory Visit: Payer: Self-pay

## 2018-02-19 ENCOUNTER — Emergency Department (HOSPITAL_BASED_OUTPATIENT_CLINIC_OR_DEPARTMENT_OTHER)

## 2018-02-19 DIAGNOSIS — G43009 Migraine without aura, not intractable, without status migrainosus: Secondary | ICD-10-CM | POA: Diagnosis not present

## 2018-02-19 DIAGNOSIS — Z87891 Personal history of nicotine dependence: Secondary | ICD-10-CM | POA: Insufficient documentation

## 2018-02-19 DIAGNOSIS — Z853 Personal history of malignant neoplasm of breast: Secondary | ICD-10-CM | POA: Insufficient documentation

## 2018-02-19 DIAGNOSIS — Z923 Personal history of irradiation: Secondary | ICD-10-CM | POA: Diagnosis not present

## 2018-02-19 DIAGNOSIS — Z79899 Other long term (current) drug therapy: Secondary | ICD-10-CM | POA: Insufficient documentation

## 2018-02-19 DIAGNOSIS — I10 Essential (primary) hypertension: Secondary | ICD-10-CM | POA: Insufficient documentation

## 2018-02-19 DIAGNOSIS — H539 Unspecified visual disturbance: Secondary | ICD-10-CM | POA: Insufficient documentation

## 2018-02-19 DIAGNOSIS — R51 Headache: Secondary | ICD-10-CM | POA: Diagnosis present

## 2018-02-19 NOTE — ED Triage Notes (Signed)
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 Then she developed aheadache, the headache eased and her daughter was there asking for a couple drinks, at that time she could not quite understand what it was her daughter was asking and then she could not remember how to make the drink. SHe then developed a severe headache and a smell of urine in her nose. Denis numbness, tingling and weakness. NO slurred speech or facial droop. She is alert and oriented.

## 2018-02-19 NOTE — ED Provider Notes (Signed)
[place] EMERGENCY DEPARTMENT Provider Note  CSN: [id number] Arrival date & time: 02/19/18 0043  Chief Complaint(s) Headache  HPI Madison West is a 62 y.o. female with a history of migraine headaches with auras presents to the emergency department with severe headache that was preceded by right eye aura.  She reports that she had a mild headache that started earlier in the day.  This headache went away.  The severe headache started 3 hours prior to arrival.  Patient reports that her typical headaches are mild but are associated with the similar aura.  States that this headache was severe in nature and sudden onset brought on by bright lights.  Endorse some memory and word finding issues.  No focal deficits.  No slurred speech or facial droop.  No numbness or tingling.  No trauma.  No fevers.  No associated nausea or vomiting.  Patient took Motrin which is improving her headache.  Patient is very concerned that this might be related to aneurysm as her sister died of a ruptured aneurysm a decade ago.  HPI  Past Medical History Past Medical History:  Diagnosis Date  . Anxiety   . Arthritis    low back and hip pain intermittent  . Breast cancer ([place]) 06/02/07   r breast -surgery ,radiaology. chemotherapy  . Carpal tunnel syndrome    right hand  . Colon polyps   . Complication of anesthesia    Fentanyl, Versed-makes extra hyper, bradycardia x 1 episode in past  . Depression   . Dysplasia of vulva   . Hypertension   . S/P breast lumpectomy 07/04/07   R breast  . S/P radiation therapy 2009   Patient Active Problem List   Diagnosis Date Noted  . Genetic testing 03/01/2015  . Breast cancer of lower-outer quadrant of right female breast ([place]) 05/25/2013  . Sciatica of right side 10/19/2012  . CRPS (complex regional pain syndrome), upper limb 10/19/2012  . History of DVT (deep vein thrombosis) 10/05/2011  . Hypertension 10/04/2011  . Anxiety 10/04/2011  . Depression  10/04/2011  . Personal history of vulvar dysplasia 08/15/2011  . 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 S/P breast lumpectomy    Home Medication(s) Prior to Admission medications   Medication Sig Start Date End Date Taking? Authorizing Provider  alprazolam (XANAX) 2 MG tablet TAKE 1/2 TO 1 (ONE-HALF TO ONE) TABLET BY MOUTH THREE TIMES DAILY AS NEEDED FOR ANXIETY 10/15/17   [name], MD  Calcium Carbonate-Vitamin D (CALCIUM + D PO) Take 600 mg by mouth daily.      [provider]  cyclobenzaprine (FLEXERIL) 5 MG tablet Take 1-2 tablets (5-10 mg total) by mouth 3 (three) times daily as needed for muscle spasms. 10/15/17   [name], MD  FLUoxetine (PROZAC) 20 MG tablet Take 1 tablet (20 mg total) by mouth daily. 10/15/17   [name], MD  losartan-hydrochlorothiazide (HYZAAR) 50-12.5 MG tablet Take 1 tablet by mouth daily. 10/15/17   [name], MD  oxyCODONE-acetaminophen (PERCOCET) 10-325 MG tablet Take 1 tablet by mouth daily as needed for pain. To take one tablet once or twice daily as needed for back and hip pain. 10/15/17   [name], MD  Past Surgical History Past Surgical History:  Procedure Laterality Date  . APPENDECTOMY     age 50  . CESAREAN SECTION     x 2  . COLONOSCOPY W/ POLYPECTOMY    . COLONOSCOPY WITH PROPOFOL N/A 04/27/2016   Procedure: COLONOSCOPY WITH PROPOFOL;  Surgeon: Garlan Fair, MD;  Location: [facility];  Service: Endoscopy;  Laterality: N/A;  . DILATION AND CURETTAGE OF UTERUS     multiple  . INTRAUTERINE DEVICE INSERTION    . IUD REMOVAL    . MASTECTOMY PARTIAL / LUMPECTOMY W/ AXILLARY LYMPHADENECTOMY    . TUBAL LIGATION    . VULVA SURGERY     Multiple times for dysplasia   Family History Family History  Problem Relation Age of Onset  . Colon cancer Mother 45    . Heart disease Father        heart attack  . Colon polyps Brother        one brother had large colon polyp that needed surgery to be removed  . Heart disease Paternal Grandfather     Social History Social History   Tobacco Use  . Smoking status: Former Smoker    Packs/day: 0.75    Years: 20.00    Pack years: 15.00    Types: Cigarettes    Last attempt to quit: 05/06/1997    Years since quitting: 20.8  . Smokeless tobacco: Never Used  Substance Use Topics  . Alcohol use: Yes    Alcohol/week: 0.0 standard drinks    Comment: 2 daily  . Drug use: No   Allergies Prednisone; Fentanyl; Midazolam; and Vicodin [hydrocodone-acetaminophen]  Review of Systems Review of Systems All other systems are reviewed and are negative for acute change except as noted in the HPI  Physical Exam Vital Signs  I have reviewed the triage vital signs BP (!) 154/90 (BP Location: Left Arm)   Pulse 83   Temp 97.8 F (36.6 C) (Oral)   Resp 18   Ht 5\' 9"  (1.753 m)   Wt 90.7 kg   SpO2 98%   BMI 29.53 kg/m   Physical Exam  Constitutional: She is oriented to person, place, and time. She appears well-developed and well-nourished. No distress.  HENT:  Head: Normocephalic and atraumatic.  Right Ear: External ear normal.  Left Ear: External ear normal.  Nose: Nose normal.  Eyes: Conjunctivae and EOM are normal. No scleral icterus.  Neck: Normal range of motion and phonation normal.  Cardiovascular: Normal rate and regular rhythm.  Pulmonary/Chest: Effort normal. No stridor. No respiratory distress.  Abdominal: She exhibits no distension.  Musculoskeletal: Normal range of motion. She exhibits no edema.  Neurological: She is alert and oriented to person, place, and time.  Mental Status:  Alert and oriented to person, place, and time.  Attention and concentration normal.  Speech clear.  Recent memory is intact  Cranial Nerves:  II Visual Fields: Intact to confrontation. Visual fields  intact. III, IV, VI: Pupils equal and reactive to light and near. Full eye movement without nystagmus  V Facial Sensation: Normal. No weakness of masticatory muscles  VII: No facial weakness or asymmetry  VIII Auditory Acuity: Grossly normal  IX/X: The uvula is midline; the palate elevates symmetrically  XI: Normal sternocleidomastoid and trapezius strength  XII: The tongue is midline. No atrophy or fasciculations.   Motor System: Muscle Strength: 5/5 and symmetric in the upper and lower extremities. No pronation or drift.  Muscle Tone: Tone and muscle bulk are normal  in the upper and lower extremities.   Reflexes: DTRs: 1+ and symmetrical in all four extremities. No Clonus Coordination: Intact finger-to-nose, heel-to-shin. No tremor.  Sensation: Intact to light touch, and pinprick. Negative Romberg test.  Gait: Routine gait normal.   Skin: She is not diaphoretic.  Psychiatric: She has a normal mood and affect. Her behavior is normal.  Vitals reviewed.   ED Results and Treatments Labs (all labs ordered are listed, but only abnormal results are displayed) Labs Reviewed - No data to display                                                                                                                       EKG  EKG Interpretation  Date/Time:    Ventricular Rate:    PR Interval:    QRS Duration:   QT Interval:    QTC Calculation:   R Axis:     Text Interpretation:        Radiology Ct Head Wo Contrast  Result Date: 02/19/2018 CLINICAL DATA:  Patient complains of severe headache. EXAM: CT HEAD WITHOUT CONTRAST TECHNIQUE: Contiguous axial images were obtained from the base of the skull through the vertex without intravenous contrast. COMPARISON:  None. FINDINGS: Brain: Ventricles and sulci are prominent, compatible with atrophy. Periventricular and subcortical white matter hypodensity compatible with chronic microvascular ischemic changes. No evidence for acute cortically based  infarct, intracranial hemorrhage, mass lesion or mass-effect. Vascular: Unremarkable. Skull: Intact. Sinuses/Orbits: Paranasal sinuses are well aerated. Mastoid air cells are unremarkable. Orbits are unremarkable. Other: None. IMPRESSION: No acute intracranial process. Atrophy and chronic microvascular ischemic changes. Electronically Signed   By: Lovey Newcomer M.D.   On: 02/19/2018 01:40  Rash Pertinent labs & imaging results that were available during my care of the patient were reviewed by me and considered in my medical decision making (see chart for details).  Medications Ordered in ED Medications - No data to display                                                                                                                                  Procedures Procedures  (including critical care time)  Medical Decision Making / ED Course I have reviewed the nursing notes for this encounter and the patient's prior records (if available in EHR or on provided paperwork).    Sudden onset severe headache.  Patient is concerned for possible subarachnoid hemorrhage.  CT scan less than 6 hours from onset negative.    Non focal neuro exam. No recent head trauma. No fever. Doubt meningitis.   Since arrival headache has improved significantly without intervention.  The patient appears reasonably screened and/or stabilized for discharge and I doubt any other medical condition or other [facility] requiring further screening, evaluation, or treatment in the ED at this time prior to discharge.  The patient is safe for discharge with strict return precautions.     Final Clinical Impression(s) / ED Diagnoses Final diagnoses:  Migraine without aura and without status migrainosus, not intractable   Disposition: Discharge  Condition: Good  I have discussed the results, Dx and Tx plan with the patient who expressedunderstanding and agree(s) with the plan. Discharge instructions discussed at great length. The  patient was given strict return precautions who verbalized understanding of the instructions. No further questions at time of discharge.    ED Discharge Orders    None       Follow Up: Primary care provider  Schedule an appointment as soon as possible for a visit  As needed      This chart was dictated using voice recognition software.  Despite best efforts to proofread,  errors can occur which can change the documentation meaning.   Fatima Blank, MD 02/19/18 [phone]

## 2018-03-30 ENCOUNTER — Other Ambulatory Visit: Payer: Self-pay | Admitting: Family Medicine

## 2018-03-30 DIAGNOSIS — F411 Generalized anxiety disorder: Secondary | ICD-10-CM

## 2018-03-30 NOTE — Telephone Encounter (Signed)
Requested medication (s) are due for refill today: Yes  Requested medication (s) are on the active medication list: Yes  Last refill:  10/15/17  Future visit scheduled: No  Notes to clinic:  Left message for pt. To call and schedule an appointment.    Requested Prescriptions  Pending Prescriptions Disp Refills   alprazolam (XANAX) 2 MG tablet [Pharmacy Med Name: ALPRAZolam 2MG       TAB] 90 tablet 0    Sig: TAKE 1/2 TO 1 (ONE-HALF TO ONE) TABLET BY MOUTH THREE TIMES DAILY AS NEEDED FOR ANXIETY     Not Delegated - Psychiatry:  Anxiolytics/Hypnotics Failed - 03/30/2018 12:43 PM      Failed - This refill cannot be delegated      Failed - Urine Drug Screen completed in last 360 days.      Failed - Valid encounter within last 6 months    Recent Outpatient Visits          5 months ago Essential hypertension   [facility], Ranell Patrick, MD   11 months ago Essential hypertension   [facility], Ranell Patrick, MD   1 year ago SVT (supraventricular tachycardia) [facility])   [facility], Ranell Patrick, MD   2 years ago Generalized anxiety disorder   [facility], MD   2 years ago Need for hepatitis C screening test   [facility], PA-C

## 2018-03-30 NOTE — Telephone Encounter (Signed)
Patient is requesting a refill of the following medications: Requested Prescriptions   Pending Prescriptions Disp Refills  . alprazolam (XANAX) 2 MG tablet [Pharmacy Med Name: ALPRAZolam 2MG       TAB] 90 tablet 0    Sig: TAKE 1/2 TO 1 (ONE-HALF TO ONE) TABLET BY MOUTH THREE TIMES DAILY AS NEEDED FOR ANXIETY    Date of patient request: 03/30/18 Last office visit: 10/15/2017 Date of last refill: 10/15/2017 Last refill amount: #270 Follow up time period per chart:  Pt has schedule f/u ov appt 04/26/18 at 2 pm with Carlota Raspberry.  Please advise. Dgaddy, CMA

## 2018-03-30 NOTE — Telephone Encounter (Signed)
Pt scheduled medication refill OV on 11/05 at 2pm.

## 2018-04-01 NOTE — Telephone Encounter (Signed)
Refilled for #90.  Needs OV prior to further refills as planned 3-4 week follow up at 10/15/17 office visit.

## 2018-04-21 ENCOUNTER — Telehealth: Payer: Self-pay

## 2018-04-21 NOTE — Telephone Encounter (Signed)
appt made for 11/8 12 noon with Dr. Carlota Raspberry Re: med refills.

## 2018-04-22 ENCOUNTER — Telehealth: Payer: Self-pay | Admitting: Family Medicine

## 2018-04-22 NOTE — Telephone Encounter (Signed)
Called pt to schedule appt . Pt had already setup appt for 04/29/2018

## 2018-04-26 ENCOUNTER — Ambulatory Visit: Admitting: Family Medicine

## 2018-04-29 ENCOUNTER — Ambulatory Visit: Admitting: Family Medicine

## 2018-05-13 ENCOUNTER — Telehealth: Payer: Self-pay | Admitting: Family Medicine

## 2018-05-13 DIAGNOSIS — F411 Generalized anxiety disorder: Secondary | ICD-10-CM

## 2018-05-13 NOTE — Telephone Encounter (Signed)
Requested medication (s) are due for refill today: Yes  Requested medication (s) are on the active medication list: Yes  Last refill:  04/01/18  Future visit scheduled: Yes  Notes to clinic:  See request    Requested Prescriptions  Pending Prescriptions Disp Refills   alprazolam (XANAX) 2 MG tablet [Pharmacy Med Name: ALPRAZolam 2MG       TAB] 90 tablet 0    Sig: TAKE 1/2 TO 1 (ONE-HALF TO ONE) TABLET BY MOUTH THREE TIMES DAILY AS NEEDED FOR ANXIETY     Not Delegated - Psychiatry:  Anxiolytics/Hypnotics Failed - 05/13/2018  5:30 AM      Failed - This refill cannot be delegated      Failed - Urine Drug Screen completed in last 360 days.      Failed - Valid encounter within last 6 months    Recent Outpatient Visits          7 months ago Essential hypertension   [facility], Ranell Patrick, MD   1 year ago Essential hypertension   [facility], Ranell Patrick, MD   1 year ago SVT (supraventricular tachycardia) [facility]   [facility], Ranell Patrick, MD   2 years ago Generalized anxiety disorder   [facility], [facility]   2 years ago Need for hepatitis C screening test   [facility], Damaris Hippo, PA-C      Future Appointments            In 1 week Carlota Raspberry Ranell Patrick, MD [facility] [place], [facility]

## 2018-05-17 ENCOUNTER — Encounter: Payer: Self-pay | Admitting: Family Medicine

## 2018-05-17 NOTE — Telephone Encounter (Signed)
Medication was denied and not faxed to pharmacy due to pt needs to be seen.

## 2018-05-17 NOTE — Addendum Note (Signed)
Addended by: Norton Blizzard R on: 97/94/8016 09:43 AM   Modules accepted: Orders

## 2018-05-20 ENCOUNTER — Encounter: Payer: Self-pay | Admitting: Emergency Medicine

## 2018-05-20 ENCOUNTER — Other Ambulatory Visit: Payer: Self-pay | Admitting: Emergency Medicine

## 2018-05-20 ENCOUNTER — Telehealth: Payer: Self-pay | Admitting: Emergency Medicine

## 2018-05-20 DIAGNOSIS — F411 Generalized anxiety disorder: Secondary | ICD-10-CM

## 2018-05-20 NOTE — Telephone Encounter (Addendum)
Pt following up on the request for xanax. Pt states she had surgery on 11/05 and could not come to the appt on 11/08. Pt states she was advised ("promised") the other day the Rx was going to be sent. But not at the pharmacy. Pt states she will not have enough med to get her through to her appt on 12/03.  Pt hopes she can get enough to get through to her appt Call back [phone] Thank you!

## 2018-05-21 MED ORDER — ALPRAZOLAM 2 MG PO TABS: ORAL_TABLET | ORAL | 0 refills | Status: DC

## 2018-05-21 NOTE — Telephone Encounter (Signed)
Refilled once locally, will need to keep follow up as planned to discuss plan.  See last ov in April.

## 2018-05-24 ENCOUNTER — Encounter: Payer: Self-pay | Admitting: Family Medicine

## 2018-05-24 ENCOUNTER — Other Ambulatory Visit: Payer: Self-pay

## 2018-05-24 ENCOUNTER — Ambulatory Visit (INDEPENDENT_AMBULATORY_CARE_PROVIDER_SITE_OTHER): Admitting: Family Medicine

## 2018-05-24 ENCOUNTER — Encounter

## 2018-05-24 VITALS — BP 166/92 | HR 86 | Temp 97.9°F | Ht 69.0 in | Wt 205.2 lb

## 2018-05-24 DIAGNOSIS — M545 Low back pain, unspecified: Secondary | ICD-10-CM

## 2018-05-24 DIAGNOSIS — F411 Generalized anxiety disorder: Secondary | ICD-10-CM

## 2018-05-24 DIAGNOSIS — I10 Essential (primary) hypertension: Secondary | ICD-10-CM | POA: Diagnosis not present

## 2018-05-24 MED ORDER — ALPRAZOLAM 2 MG PO TABS: ORAL_TABLET | ORAL | 0 refills | Status: DC

## 2018-05-24 MED ORDER — OXYCODONE-ACETAMINOPHEN 10-325 MG PO TABS: 1.0000 | ORAL_TABLET | Freq: Every day | ORAL | 0 refills | Status: DC | PRN

## 2018-05-24 NOTE — Progress Notes (Signed)
Subjective:    Patient ID: Madison West, female    DOB: July 13, 1955, 62 y.o.   MRN: [id number]  HPI Madison West is a 62 y.o. female Presents today for: Chief Complaint  Patient presents with  . Medication Refill    xanax ([facility]) / oxycodone  . Hypertension    talking about the BP meds    Anxiety: See previous visits.  Has been on chronic benzodiazepine use with Xanax 2 mg one half in the morning half in the afternoon and one-point 5 at night.  Had not been seen by therapist in approximately 2 years reportedly due to cost at our last visit in April.  We have discussed use of SSRI for anxiety at multiple prior visits and have discussed risks of chronic benzodiazepine use, especially with her intermittent need for oxycodone for back and hip pain.  She did report that new medications made her anxious which was part of the reason for not wanting to start an SSRI.  She did agree to start Prozac at last visit in April with plan follow-up in 4 to 6 weeks.  We did discuss at last visit that if she was unable to tolerate the SSRI or did not take SSRI, psychiatry follow-up would be needed for chronic use of benzodiazepines, and to evaluate for other treatments for her anxiety.  30-day supply of Xanax was provided locally with other 90-day prescription sent to [facility].  This is her first visit back with me since April.   I did order 90 tablets of Xanax 2 mg on November 30 as a bridge to this visit.  Medications today possible blood work follow-up with your medical doctor blood pressure is up a little bit on you started the beta-blocker.  Took 2 days of prozac and went to the ER with tachycardia. (Did not take prozac for a few months after last visit). Still has panic attacks.  Takes 1/2 pill in am, 1/2 in afternoon, 3/4-1 pill at night.  was able to wean off for 10 years in past.   TSH normal in July.   Etoh: Few drinks per week.  No illicit drug use.   Has not met with therapist- cost  prohibitive.   Depression screen [facility] 2/9 05/24/2018 10/15/2017 04/06/2017 08/25/2016 02/04/2016  Decreased Interest 0 0 0 0 0  Down, Depressed, Hopeless 0 0 0 0 0  PHQ - 2 Score 0 0 0 0 0  Some recent data might be hidden      Requests refill of oxycodone for prn hip pain and back pain. # 30 from April. #26 from recent surgery - finished last weekend.  GU surgery November 5th. Denies addiction to medications.     Hypertension: BP Readings from Last 3 Encounters:  05/24/18 (!) 166/92  02/19/18 (!) 154/90  01/19/18 (!) 171/102   Lab Results  Component Value Date   CREATININE 0.70 01/18/2018  Cardiologist is Dr. Einar Gip, history of SVT.  She was seen in the ER July 31 with tachycardia, rate in the 120s.  No SVT or arrhythmia at that time, appeared to be sinus tachycardia.  EKG with sinus rhythm and Mobitz 1 block.  Thought to be mildly dehydrated, given IV fluids.  Plan to follow-up with cardiology.  Subsequently seen by cardiology, had a 30-day event monitor with PACs, atrial bigeminy, rare PVCs which were symptomatic with palpitations.  She had a nonischemic nuclear stress test.  Echocardiogram with mild aortic stenosis.  It was thought  that her PVCs, PACs, and aortic stenosis may be related to previous history of radiation therapy to her chest with radiation-induced valve disease.  NSVT was probably of nonclinical significance.  She was reassured.  She had not started her beta-blocker but was advised she can start that at any time.  Plan for six-month follow-up with cardiology.  Of note her blood pressure was 150/83 at cardiology visit.   Checks blood pressure at home (and repeats at rest)Not taking losartan/HCT daily. If blood pressure not getting down to controlled level, then will take 1/2 pill (if BP 150/100- takes 1/2 of losartan hct).  Has not taken pill since 2 weeks ago.  Usually BP in 120-130/80 range. Concerned that BP drops too low on med.    Patient Active Problem List   Diagnosis  Date Noted  . Genetic testing 03/01/2015  . Breast cancer of lower-outer quadrant of right female breast ([place]) 05/25/2013  . Sciatica of right side 10/19/2012  . CRPS (complex regional pain syndrome), upper limb 10/19/2012  . History of DVT (deep vein thrombosis) 10/05/2011  . Hypertension 10/04/2011  . Anxiety 10/04/2011  . Depression 10/04/2011  . Personal history of vulvar dysplasia 08/15/2011  . 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 S/P breast lumpectomy    Past Medical History:  Diagnosis Date  . Anxiety   . Arthritis    low back and hip pain intermittent  . Breast cancer ([place]) 06/02/07   r breast -surgery ,radiaology. chemotherapy  . Carpal tunnel syndrome    right hand  . Colon polyps   . Complication of anesthesia    Fentanyl, Versed-makes extra hyper, bradycardia x 1 episode in past  . Depression   . Dysplasia of vulva   . Hypertension   . S/P breast lumpectomy 07/04/07   R breast  . S/P radiation therapy 2009   Past Surgical History:  Procedure Laterality Date  . APPENDECTOMY     age 51  . CESAREAN SECTION     x 2  . COLONOSCOPY W/ POLYPECTOMY    . COLONOSCOPY WITH PROPOFOL N/A 04/27/2016   Procedure: COLONOSCOPY WITH PROPOFOL;  Surgeon: Garlan Fair, MD;  Location: [facility] END[facility];  Service: Endoscopy;  Laterality: N/A;  . DILATION AND CURETTAGE OF UTERUS     multiple  . INTRAUTERINE DEVICE INSERTION    . IUD REMOVAL    . MASTECTOMY PARTIAL / LUMPECTOMY W/ AXILLARY LYMPHADENECTOMY    . TUBAL LIGATION    . VULVA SURGERY     Multiple times for dysplasia   Allergies  Allergen Reactions  . Prednisone Other (See Comments)  . Fentanyl Other (See Comments)    MAKES SUPER HYPER PER PT  . Midazolam Anxiety  . Vicodin [Hydrocodone-Acetaminophen] Anxiety    Extreme anxiety.   Prior to Admission medications   Medication Sig Start Date End Date Taking? Authorizing Provider  alprazolam (XANAX) 2 MG tablet TAKE 1/2 TO 1 (ONE-HALF TO ONE) TABLET  BY MOUTH THREE TIMES DAILY AS NEEDED FOR ANXIETY 05/21/18  Yes [name], MD  Calcium Carbonate-Vitamin D (CALCIUM + D PO) Take 600 mg by mouth daily.     Yes [provider]  cyclobenzaprine (FLEXERIL) 5 MG tablet Take 1-2 tablets (5-10 mg total) by mouth 3 (three) times daily as needed for muscle spasms. 10/15/17  Yes [name], MD  losartan-hydrochlorothiazide (HYZAAR) 50-12.5 MG tablet Take 1 tablet by mouth daily. 10/15/17  Yes [name], MD  oxyCODONE-acetaminophen [facility]) [phone]  MG tablet Take 1 tablet by mouth daily as needed for pain. To take one tablet once or twice daily as needed for back and hip pain. 10/15/17  Yes [name], MD   Social History   Socioeconomic History  . Marital status: Widowed    Spouse name: Not on file  . Number of children: 2  . Years of education: Not on file  . Highest education level: Not on file  Occupational History  . Occupation: Metallurgist: [employer]  . Financial resource strain: Not on file  . Food insecurity:    Worry: Not on file    Inability: Not on file  . Transportation needs:    Medical: Not on file    Non-medical: Not on file  Tobacco Use  . Smoking status: Former Smoker    Packs/day: 0.75    Years: 20.00    Pack years: 15.00    Types: Cigarettes    Last attempt to quit: 05/06/1997    Years since quitting: 21.0  . Smokeless tobacco: Never Used  Substance and Sexual Activity  . Alcohol use: Yes    Alcohol/week: 0.0 standard drinks    Comment: 2 daily  . Drug use: No  . Sexual activity: Never    Birth control/protection: Abstinence  Lifestyle  . Physical activity:    Days per week: Not on file    Minutes per session: Not on file  . Stress: Not on file  Relationships  . Social connections:    Talks on phone: Not on file    Gets together: Not on file    Attends religious service: Not on file    Active member of club or organization: Not on file     Attends meetings of clubs or organizations: Not on file    Relationship status: Not on file  . Intimate partner violence:    Fear of current or ex partner: Not on file    Emotionally abused: Not on file    Physically abused: Not on file    Forced sexual activity: Not on file  Other Topics Concern  . Not on file  Social History Narrative  . Not on file    Review of Systems Per HPI.     Objective:   Physical Exam  Constitutional: She is oriented to person, place, and time. She appears well-developed and well-nourished.  HENT:  Head: Normocephalic and atraumatic.  Eyes: Pupils are equal, round, and reactive to light. Conjunctivae and EOM are normal.  Neck: Carotid bruit is not present.  Cardiovascular: Normal rate, regular rhythm, normal heart sounds and intact distal pulses.  Pulmonary/Chest: Effort normal and breath sounds normal.  Abdominal: Soft. She exhibits no pulsatile midline mass. There is no tenderness.  Neurological: She is alert and oriented to person, place, and time.  Skin: Skin is warm and dry.  Psychiatric: Her speech is normal and behavior is normal. Thought content normal. Her mood appears anxious. She expresses no suicidal ideation.  Vitals reviewed.  Vitals:   05/24/18 1451 05/24/18 1502  BP: (!) 177/101 (!) 166/92  Pulse: 86   Temp: 97.9 F (36.6 C)   TempSrc: Oral   SpO2: 96%   Weight: 205 lb 3.2 oz (93.1 kg)   Height: 5' 9"  (1.753 m)        Assessment & Plan:   EDDIS PINGLETON is a 62 y.o. female GAD (generalized anxiety disorder) - Plan: Ambulatory referral to Psychiatry Generalized  anxiety disorder - Plan: alprazolam (XANAX) 2 MG tablet  -Longstanding anxiety, with benzodiazepine dependence for treatment.  See previous notes regarding treatment options.  Tried 1 to 2 days of SSRI by her account with reported tachycardia.  I do not see that listed on notes from the ER, but she reports that she had started Prozac prior to ER visit for  tachycardia.   -Refer to psychiatry for ongoing care and treatment options.  I did agree to refill Xanax for the next 3 months to allow time to be seen by psychiatry.  Recent 1 month prescription was given, 3 month Rx [facility] to [facility].  Risks, side effects discussed of benzodiazepines.  Understanding expressed.   Left-sided low back pain without sciatica, unspecified chronicity - Plan: oxyCODONE-acetaminophen (PERCOCET) 10-325 MG tablet Chronic low back pain - Plan: oxyCODONE-acetaminophen (PERCOCET) 10-325 MG tablet  -Intermittent flares of left low back pain with sciatica.  Has tolerated oxycodone for as needed use only with infrequent refills.  She is aware of contraindication of using narcotics as well as benzodiazepines at the same time and does avoid combining benzo when she has taken narcotic.  Plans to follow-up in the next 3 months so we can discuss future plans for back pain/sciatica management.  Temporary prescription given, risks and side effects discussed.   Essential hypertension  -Uncontrolled in office, reports variable readings at home.  With intermittent dosing of one half of her blood pressure medication, she may actually benefit from daily low-dose beta-blocker for both blood pressure management, tachycardia management, and anxiety.  Advised she discuss this with her cardiologist.  Meds ordered this encounter  Medications  . oxyCODONE-acetaminophen (PERCOCET) 10-325 MG tablet    Sig: Take 1 tablet by mouth daily as needed for pain. To take one tablet once or twice daily as needed for back and hip pain.    Dispense:  30 tablet    Refill:  0  . alprazolam (XANAX) 2 MG tablet    Sig: TAKE 1/2 TO 1 (ONE-HALF TO ONE) TABLET BY MOUTH THREE TIMES DAILY AS NEEDED FOR ANXIETY    Dispense:  270 tablet    Refill:  0   Patient Instructions    For blood pressure I would recommend daily medication - possible low dose losartan or low dose beta blocker by itself as that may help fast  heart rate issues. Please discuss with your cardiologist.   For anxiety, I will refer you to psychiatry to discuss treatment of anxiety. I will refill meds for next 90 days to allow time to see psychiatrist, but will need to see psychiatrist for further refills. I am happy to take care of other medical concerns.   I refilled the oxycodone for as needed if her back pain, but please follow-up with me in the next 3 months to discuss those areas further and other treatment options.  Return to the clinic or go to the nearest emergency room if any of your symptoms worsen or new symptoms occur.   If you have lab work done today you will be contacted with your lab results within the next 2 weeks.  If you have not heard from [place] then please contact us. The fastest way to get your results is to register for My Chart.   IF you received an x-ray today, you will receive an invoice from [facility]. Please contact [facility] at [phone] with questions or concerns regarding your invoice.   IF you received labwork today, you will receive  an Pharmacologist from [facility]. Please contact [place] at [phone] with questions or concerns regarding your invoice.   Our billing staff will not be able to assist you with questions regarding bills from these companies.  You will be contacted with the lab results as soon as they are available. The fastest way to get your results is to activate your My Chart account. Instructions are located on the last page of this paperwork. If you have not heard from [place] regarding the results in 2 weeks, please contact this office.       Signed,   Merri Ray, MD [facility].  05/25/18 9:54 AM

## 2018-05-24 NOTE — Patient Instructions (Addendum)
  For blood pressure I would recommend daily medication - possible low dose losartan or low dose beta blocker by itself as that may help fast heart rate issues. Please discuss with your cardiologist.   For anxiety, I will refer you to psychiatry to discuss treatment of anxiety. I will refill meds for next 90 days to allow time to see psychiatrist, but will need to see psychiatrist for further refills. I am happy to take care of other medical concerns.   I refilled the oxycodone for as needed if her back pain, but please follow-up with me in the next 3 months to discuss those areas further and other treatment options.  Return to the clinic or go to the nearest emergency room if any of your symptoms worsen or new symptoms occur.   If you have lab work done today you will be contacted with your lab results within the next 2 weeks.  If you have not heard from [place] then please contact us. The fastest way to get your results is to register for My Chart.   IF you received an x-ray today, you will receive an invoice from [facility]. Please contact [facility] at [phone] with questions or concerns regarding your invoice.   IF you received labwork today, you will receive an invoice from [facility]. Please contact [facility] at [phone] with questions or concerns regarding your invoice.   Our billing staff will not be able to assist you with questions regarding bills from these companies.  You will be contacted with the lab results as soon as they are available. The fastest way to get your results is to activate your My Chart account. Instructions are located on the last page of this paperwork. If you have not heard from [place] regarding the results in 2 weeks, please contact this office.

## 2018-05-24 NOTE — Telephone Encounter (Signed)
Pt following up on the request for xanax. Pt states she had surgery on 11/05 and could not come to the appt on 11/08. Pt states she was advised ("promised") the other day the Rx was going to be sent. But not at the pharmacy. Pt states she will not have enough med to get her through to her appt on 12/03.  Pt hopes she can get enough to get through to her appt Call back [phone] Thank you!  I sent you a msg I think Friday about this patient about her medication I was just checking to be sure this was taken care of.

## 2018-05-25 ENCOUNTER — Encounter: Payer: Self-pay | Admitting: Family Medicine

## 2018-06-24 MED ORDER — GENERIC EXTERNAL MEDICATION
5.00 | Status: DC
Start: ? — End: 2018-06-24

## 2018-06-24 MED ORDER — HEPARIN SODIUM (PORCINE) 5000 UNIT/ML IJ SOLN
5000.00 | INTRAMUSCULAR | Status: DC
Start: 2018-06-24 — End: 2018-06-24

## 2018-06-24 MED ORDER — GENERIC EXTERNAL MEDICATION
600.00 | Status: DC
Start: 2018-06-24 — End: 2018-06-24

## 2018-06-24 MED ORDER — POLYETHYLENE GLYCOL 3350 17 G PO PACK
17.00 | PACK | ORAL | Status: DC
Start: ? — End: 2018-06-24

## 2018-06-24 MED ORDER — CYCLOBENZAPRINE HCL 10 MG PO TABS
10.00 | ORAL_TABLET | ORAL | Status: DC
Start: ? — End: 2018-06-24

## 2018-06-24 MED ORDER — SENNOSIDES-DOCUSATE SODIUM 8.6-50 MG PO TABS
1.00 | ORAL_TABLET | ORAL | Status: DC
Start: 2018-06-24 — End: 2018-06-24

## 2018-06-24 MED ORDER — ALPRAZOLAM 1 MG PO TABS
2.00 | ORAL_TABLET | ORAL | Status: DC
Start: ? — End: 2018-06-24

## 2018-06-24 MED ORDER — ONDANSETRON HCL 4 MG/2ML IJ SOLN
4.00 | INTRAMUSCULAR | Status: DC
Start: ? — End: 2018-06-24

## 2018-07-29 MED ORDER — LOSARTAN POTASSIUM 50 MG PO TABS
50.00 | ORAL_TABLET | ORAL | Status: DC
Start: 2018-07-30 — End: 2018-07-29

## 2018-07-29 MED ORDER — CYCLOBENZAPRINE HCL 10 MG PO TABS
10.00 | ORAL_TABLET | ORAL | Status: DC
Start: ? — End: 2018-07-29

## 2018-07-29 MED ORDER — GENERIC EXTERNAL MEDICATION
600.00 | Status: DC
Start: 2018-07-29 — End: 2018-07-29

## 2018-07-29 MED ORDER — HYDROMORPHONE HCL PF 1 MG/ML IJ SOLN
.20 | INTRAMUSCULAR | Status: DC
Start: ? — End: 2018-07-29

## 2018-07-29 MED ORDER — ALPRAZOLAM 1 MG PO TABS
1.00 | ORAL_TABLET | ORAL | Status: DC
Start: ? — End: 2018-07-29

## 2018-07-29 MED ORDER — ENOXAPARIN SODIUM 40 MG/0.4ML ~~LOC~~ SOLN
40.00 | SUBCUTANEOUS | Status: DC
Start: 2018-07-30 — End: 2018-07-29

## 2018-07-29 MED ORDER — SENNOSIDES-DOCUSATE SODIUM 8.6-50 MG PO TABS
1.00 | ORAL_TABLET | ORAL | Status: DC
Start: 2018-07-29 — End: 2018-07-29

## 2018-07-29 MED ORDER — ONDANSETRON HCL 4 MG/2ML IJ SOLN
4.00 | INTRAMUSCULAR | Status: DC
Start: ? — End: 2018-07-29

## 2018-07-29 MED ORDER — SODIUM CHLORIDE FLUSH 0.9 % IV SOLN
10.00 | INTRAVENOUS | Status: DC
Start: 2018-07-29 — End: 2018-07-29

## 2018-07-29 MED ORDER — AMOXICILLIN-POT CLAVULANATE 875-125 MG PO TABS
875.00 | ORAL_TABLET | ORAL | Status: DC
Start: 2018-07-29 — End: 2018-07-29

## 2018-07-29 MED ORDER — OXYCODONE HCL 5 MG PO TABS
5.00 | ORAL_TABLET | ORAL | Status: DC
Start: ? — End: 2018-07-29

## 2018-07-29 MED ORDER — GENERIC EXTERNAL MEDICATION
5.00 | Status: DC
Start: ? — End: 2018-07-29

## 2018-07-29 MED ORDER — ACETAMINOPHEN 325 MG PO TABS
650.00 | ORAL_TABLET | ORAL | Status: DC
Start: 2018-07-29 — End: 2018-07-29

## 2018-07-29 MED ORDER — SODIUM CHLORIDE FLUSH 0.9 % IV SOLN
10.00 | INTRAVENOUS | Status: DC
Start: ? — End: 2018-07-29

## 2018-08-17 ENCOUNTER — Other Ambulatory Visit: Payer: Self-pay | Admitting: Cardiology

## 2018-08-17 DIAGNOSIS — R0989 Other specified symptoms and signs involving the circulatory and respiratory systems: Secondary | ICD-10-CM

## 2018-09-08 DIAGNOSIS — C519 Malignant neoplasm of vulva, unspecified: Secondary | ICD-10-CM | POA: Diagnosis not present

## 2018-09-12 ENCOUNTER — Other Ambulatory Visit: Payer: Self-pay | Admitting: Family Medicine

## 2018-09-12 DIAGNOSIS — F411 Generalized anxiety disorder: Secondary | ICD-10-CM

## 2018-09-12 NOTE — Telephone Encounter (Signed)
Requested medication (s) are due for refill today: yes  Requested medication (s) are on the active medication list: yes  Last refill:  06/03/18  Future visit scheduled: no  Notes to clinic:  Medication refill not delegated to NT to refill   Requested Prescriptions  Pending Prescriptions Disp Refills   alprazolam (XANAX) 2 MG tablet [Pharmacy Med Name: ALPRAZolam 2 MG Oral Tablet] 90 tablet 0    Sig: TAKE 1/2 TO 1 (ONE-HALF TO ONE) TABLET BY MOUTH THREE TIMES DAILY AS NEEDED FOR ANXIETY     Not Delegated - Psychiatry:  Anxiolytics/Hypnotics Failed - 09/12/2018  4:06 PM      Failed - This refill cannot be delegated      Failed - Urine Drug Screen completed in last 360 days.      Passed - Valid encounter within last 6 months    Recent Outpatient Visits          3 months ago GAD (generalized anxiety disorder)   [facility], Ranell Patrick, MD   11 months ago Essential hypertension   [facility], Ranell Patrick, MD   1 year ago Essential hypertension   [facility], Ranell Patrick, MD   2 years ago SVT (supraventricular tachycardia) [facility])   [facility], Ranell Patrick, MD   2 years ago Generalized anxiety disorder   [facility], [facility]      Future Appointments            In 1 month Adrian Prows, MD [facility]

## 2018-09-13 NOTE — Telephone Encounter (Signed)
Controlled substance database (PDMP) reviewed. No concerns appreciated- last fill 11/301/9. On oxycodone, risks of additive effects and risk of overdose discussed prior. She avoids combo of narcotic and benzodiazepine. Planned psychiatry eval, but can sort this out further after current pandemic restrictions lifted. Refilled alprazolam.

## 2018-10-04 ENCOUNTER — Other Ambulatory Visit: Payer: Self-pay

## 2018-10-21 ENCOUNTER — Ambulatory Visit (INDEPENDENT_AMBULATORY_CARE_PROVIDER_SITE_OTHER): Payer: Medicare Other | Admitting: Cardiology

## 2018-10-21 ENCOUNTER — Encounter: Payer: Self-pay | Admitting: Cardiology

## 2018-10-21 ENCOUNTER — Other Ambulatory Visit: Payer: Self-pay

## 2018-10-21 VITALS — BP 142/89 | HR 77 | Ht 69.0 in | Wt 200.0 lb

## 2018-10-21 DIAGNOSIS — I471 Supraventricular tachycardia, unspecified: Secondary | ICD-10-CM

## 2018-10-21 DIAGNOSIS — I6521 Occlusion and stenosis of right carotid artery: Secondary | ICD-10-CM

## 2018-10-21 DIAGNOSIS — I10 Essential (primary) hypertension: Secondary | ICD-10-CM

## 2018-10-21 DIAGNOSIS — R002 Palpitations: Secondary | ICD-10-CM | POA: Diagnosis not present

## 2018-10-21 DIAGNOSIS — E782 Mixed hyperlipidemia: Secondary | ICD-10-CM | POA: Diagnosis not present

## 2018-10-21 DIAGNOSIS — I350 Nonrheumatic aortic (valve) stenosis: Secondary | ICD-10-CM | POA: Diagnosis not present

## 2018-10-21 DIAGNOSIS — Z853 Personal history of malignant neoplasm of breast: Secondary | ICD-10-CM

## 2018-10-21 MED ORDER — LOSARTAN POTASSIUM-HCTZ 50-12.5 MG PO TABS: 1.0000 | ORAL_TABLET | ORAL | 2 refills | Status: DC

## 2018-10-21 NOTE — Progress Notes (Signed)
Virtual Visit via Telephone Note: Patient unable to use video assisted device.  This visit type was conducted due to national recommendations for restrictions regarding the COVID-19 Pandemic (e.g. social distancing).  This format is felt to be most appropriate for this patient at this time.  All issues noted in this document were discussed and addressed.  No physical exam was performed.  The patient has consented to conduct a Telehealth visit and understands insurance will be billed.   [name], on 10/21/18 at  by TELEPHONE and verified that I am speaking with the correct person using two identifiers.   I discussed the limitations of evaluation and management by telemedicine and the availability of in person appointments. The patient expressed understanding and agreed to proceed.   I have discussed with patient regarding the safety during COVID Pandemic and steps and precautions to be taken including social distancing, frequent hand wash and use of detergent soap, gels with the patient. I asked the patient to avoid touching mouth, nose, eyes, ears with the hands. I encouraged regular walking around the neighborhood and exercise and regular diet, as long as social distancing can be maintained.  Primary Physician/Referring:  [name], MD  Patient ID: Madison West, female    DOB: Oct 08, 1955, 63 y.o.   MRN: [id number]  Chief Complaint  Patient presents with  . Hypertension  . Palpitations  . Follow-up    HPI: Madison West  is a 63 y.o. female  with history of PSVT when she presented on 08/04/2016 to the emergency room and terminated with adenosine, anxiety and depression, history of breast cancer status post radiation therapy and chemotherapy in 2009, hypertension, mild aortic valve stenosis, hyperlipidemia, presents here for follow-up of palpitations.  Patient was seen in the emergency room on 01/18/18 with sinus tachycardia and atrial tachycardia, 30 day event monitor  revealed frequent PACs and also atrial bigeminy and rare PVCs which were symptomatic with palpitations. She also had brief episodes of atrial tachycardia. One episode of NSVT. Hence, the nuclear stress test was performed which was non-ischemic. Echocardiogram does reveal mild aortic stenosis. She now presents for 6 month follow up. No new symptoms Except for marked anxiety over Covid-19. States that palpitation symptoms have remained stable.  No change in her dyspnea.  Past Medical History:  Diagnosis Date  . Anxiety   . Arthritis    low back and hip pain intermittent  . Breast cancer ([place]) 06/02/07   r breast -surgery ,radiaology. chemotherapy  . Carpal tunnel syndrome    right hand  . Colon polyps   . Complication of anesthesia    Fentanyl, Versed-makes extra hyper, bradycardia x 1 episode in past  . Depression   . Dysplasia of vulva   . Hypertension   . S/P breast lumpectomy 07/04/07   R breast  . S/P radiation therapy 2009    Past Surgical History:  Procedure Laterality Date  . APPENDECTOMY     age 63  . CESAREAN SECTION     x 2  . COLONOSCOPY W/ POLYPECTOMY    . COLONOSCOPY WITH PROPOFOL N/A 04/27/2016   Procedure: COLONOSCOPY WITH PROPOFOL;  Surgeon: Garlan Fair, MD;  Location: [facility];  Service: Endoscopy;  Laterality: N/A;  . DILATION AND CURETTAGE OF UTERUS     multiple  . INTRAUTERINE DEVICE INSERTION    . IUD REMOVAL    . MASTECTOMY PARTIAL / LUMPECTOMY W/ AXILLARY LYMPHADENECTOMY    . TUBAL LIGATION    .  VULVA SURGERY     Multiple times for dysplasia    Social History   Socioeconomic History  . Marital status: Widowed    Spouse name: Not on file  . Number of children: 2  . Years of education: Not on file  . Highest education level: Not on file  Occupational History  . Occupation: Metallurgist: [employer]  . Financial resource strain: Not on file  . Food insecurity:    Worry: Not on file    Inability: Not on file   . Transportation needs:    Medical: Not on file    Non-medical: Not on file  Tobacco Use  . Smoking status: Former Smoker    Packs/day: 0.75    Years: 20.00    Pack years: 15.00    Types: Cigarettes    Last attempt to quit: 05/06/1997    Years since quitting: 21.4  . Smokeless tobacco: Never Used  Substance and Sexual Activity  . Alcohol use: Yes    Alcohol/week: 0.0 standard drinks    Comment: occ  . Drug use: No  . Sexual activity: Never    Birth control/protection: Abstinence  Lifestyle  . Physical activity:    Days per week: Not on file    Minutes per session: Not on file  . Stress: Not on file  Relationships  . Social connections:    Talks on phone: Not on file    Gets together: Not on file    Attends religious service: Not on file    Active member of club or organization: Not on file    Attends meetings of clubs or organizations: Not on file    Relationship status: Not on file  . Intimate partner violence:    Fear of current or ex partner: Not on file    Emotionally abused: Not on file    Physically abused: Not on file    Forced sexual activity: Not on file  Other Topics Concern  . Not on file  Social History Narrative  . Not on file    Current Outpatient Medications on File Prior to Visit  Medication Sig Dispense Refill  . alprazolam (XANAX) 2 MG tablet TAKE 1/2 TO 1 (ONE-HALF TO ONE) TABLET BY MOUTH THREE TIMES DAILY AS NEEDED FOR ANXIETY (Patient taking differently: 2 mg daily. ) 90 tablet 0  . cyclobenzaprine (FLEXERIL) 5 MG tablet Take 1-2 tablets (5-10 mg total) by mouth 3 (three) times daily as needed for muscle spasms. 30 tablet 0  . ibuprofen (ADVIL) 600 MG tablet Take 600 mg by mouth every 6 (six) hours as needed.    Marland Kitchen losartan-hydrochlorothiazide (HYZAAR) 50-12.5 MG tablet Take 1 tablet by mouth daily. (Patient taking differently: Take 1 tablet by mouth as needed. ) 90 tablet 1  . oxyCODONE-acetaminophen (PERCOCET) 10-325 MG tablet Take 1 tablet by  mouth daily as needed for pain. To take one tablet once or twice daily as needed for back and hip pain. 30 tablet 0  . Calcium Carbonate-Vitamin D (CALCIUM + D PO) Take 600 mg by mouth daily.       No current facility-administered medications on file prior to visit.     Review of Systems  Constitution: Negative for decreased appetite, malaise/fatigue, weight gain and weight loss.  Eyes: Negative for visual disturbance.  Cardiovascular: Positive for dyspnea on exertion and palpitations (stable). Negative for chest pain, claudication, leg swelling, orthopnea and syncope.  Respiratory: Negative for hemoptysis and wheezing.  Endocrine: Negative for cold intolerance and heat intolerance.  Hematologic/Lymphatic: Does not bruise/bleed easily.  Skin: Negative for nail changes.  Musculoskeletal: Negative for muscle weakness and myalgias.  Gastrointestinal: Negative for abdominal pain, change in bowel habit, nausea and vomiting.  Neurological: Negative for difficulty with concentration, dizziness, focal weakness and headaches.  Psychiatric/Behavioral: Negative for altered mental status and suicidal ideas.  All other systems reviewed and are negative.     Objective  Blood pressure (!) 142/89, pulse 77, height 5\' 9"  (1.753 m), weight 200 lb (90.7 kg). Body mass index is 29.53 kg/m.    Physical exam not performed or limited due to virtual visit. Please see exam details from prior visit is as below. Physical Exam  Constitutional: She is oriented to person, place, and time. Vital signs are normal. She appears well-developed and well-nourished.  Mildly obese  HENT:  Head: Normocephalic and atraumatic.  Neck: Normal range of motion.  Cardiovascular: Normal rate, regular rhythm and intact distal pulses.  Murmur heard.  Midsystolic murmur is present with a grade of 2/6 at the upper right sternal border. Pulses:      Carotid pulses are on the right side with bruit and on the left side with bruit.  Pulmonary/Chest: Effort normal and breath sounds normal. No accessory muscle usage. No respiratory distress.  Abdominal: Soft. Bowel sounds are normal.  Musculoskeletal: Normal range of motion.  Neurological: She is alert and oriented to person, place, and time.  Skin: Skin is warm and dry.  Vitals reviewed.  Laboratory examination:   CMP Latest Ref Rng & Units 01/18/2018 10/15/2017 04/06/2017  Glucose 70 - 99 mg/dL 115(H) 95 109(H)  BUN 8 - 23 mg/dL 11 11 10   Creatinine 0.44 - 1.00 mg/dL 0.70 0.65 0.73  Sodium 135 - 145 mmol/L 133(L) 140 137  Potassium 3.5 - 5.1 mmol/L 3.5 4.3 4.4  Chloride 98 - 111 mmol/L 95(L) 100 97  CO2 22 - 32 mmol/L 27 24 23   Calcium 8.9 - 10.3 mg/dL 9.2 9.7 9.7  Total Protein 6.0 - 8.5 g/dL - 6.7 7.0  Total Bilirubin 0.0 - 1.2 mg/dL - 0.5 0.4  Alkaline Phos 39 - 117 IU/L - 67 66  AST 0 - 40 IU/L - 17 16  ALT 0 - 32 IU/L - 15 16   CBC Latest Ref Rng & Units 01/18/2018 08/25/2016 08/04/2016  WBC 4.0 - 10.5 K/uL 13.4(H) 12.3(A) 17.1(H)  Hemoglobin 12.0 - 15.0 g/dL 16.6(H) 14.9 14.9  Hematocrit 36.0 - 46.0 % 47.1(H) 41.5 43.2  Platelets 150 - 400 K/uL 438(H) - 416(H)   Lipid Panel     Component Value Date/Time   CHOL 255 (H) 10/15/2017 1624   TRIG 167 (H) 10/15/2017 1624   HDL 44 10/15/2017 1624   CHOLHDL 5.8 (H) 10/15/2017 1624   CHOLHDL 6.7 12/17/2011 0949   VLDL 48 (H) 12/17/2011 0949   LDLCALC 178 (H) 10/15/2017 1624   HEMOGLOBIN A1C Lab Results  Component Value Date   HGBA1C 5.8 (H) 10/15/2017   MPG 114 10/06/2011   TSH Recent Labs    01/18/18 2352  TSH 3.556   Cardiac Studies:   Carotid artery duplex 06-16-18: Stenosis in the right internal carotid artery (50-69%), lower end of spectrum with homogeneous plaque. Mild soft plaque left carotid artery. Antegrade right vertebral artery flow. Antegrade left vertebral artery flow. Follow up in six months is appropriate if clinically indicated.  Exercise sestamibi stress test 04/04/2018:  1. The resting electrocardiogram demonstrated normal sinus rhythm, LAD,  LAFB, incomplete RBBB, poor R progression and no resting arrhythmias. The stress electrocardiogram was normal. Patient exercised on Bruce protocol for 4:55 minutes and achieved 6.95 METS. Stress test terminated due to chest pressure and 87% MPHR achieved (Target HR >85%). Resting BP 150/88 and pea BP 218/102 mm hg. 2. Myocardial perfusion imaging is normal. Overall left ventricular systolic function was normal without regional wall motion abnormalities. The left ventricular ejection fraction was 72%. This is a low risk study. Chest pain could be related to hypertensive heart disease. Clinical correlation recommended.  Event monitor 30 days 03/11/2018: Episodic transmission 03/20/2018 1:30 pm: 5 beat NSVT.One brief atrial tach @ 142/min. Frequent PAC in bigeminal pattern, symptomatic. Rare PVC.  Echocardiogram 03/08/2018: Left ventricle cavity is normal in size. Moderate concentric hypertrophy of the left ventricle. Normal global wall motion. Doppler evidence of grade I (impaired) diastolic dysfunction, elevated LAP. Calculated EF 60%. Mild aortic valve leaflet calcification. Severely restricted aortic valve leaflets. Mild aortic valve stenosis. Trace aortic regurgitation. Aortic valve peak pressure gradient of 23 and mean gradient of 13 mmHg, calculated aortic valve area 1.59 cm. In 2-D, the valve appears to be severely restricted. Compared to the echocardiogram 09/09/2016, mean gradient has increased from 13 mmHg. Otherwise no significant change. Consider TEE if clinically indicated.  Assessment   Palpitations  PSVT (paroxysmal supraventricular tachycardia) (HCC)  Mild aortic stenosis  Asymptomatic stenosis of right carotid artery  Hypercholesteremia  History of breast cancer - 2009 right breast cancer s/p lumpectomy and chemo and RT  EKG 01/26/2018: Normal sinus rhythm at 89 bpm with first-degree AV block and borderline  right atrial enlargement. Normal interval, no evidence of ischemia. No significant change compared to EKG March 2018.  Recommendations:   I had a 30 minute telephone face to face (telephone encounter), regarding hyperlipidemia, hypertension and carotid stenosis. She is very resistant to adding any medications. Is opposed to starting any medications. She is willing to take Losartan HCT daily. Patient instructed to start ASA  81mg  q daily for prophylaxis. Additional 10 minute in documentation and review of labs.  Otherwise no new symptoms, no change in dyspnea. Palpitations are stable.  Schedule for carotid duplex for bruit/follow-up surveillance of carotid stenosis. Diet modification and use of natural medications like red yeast rice, flax seed oil discussed as well. Lipids in 4-6 weeks. Office visit following the work-up/investigations.   Adrian Prows, MD, [facility] 10/21/2018, 2:37 PM [facility]. [place] Pager: [phone] Office: [phone] If no answer Cell [phone]

## 2018-11-04 ENCOUNTER — Other Ambulatory Visit: Payer: Self-pay

## 2018-11-04 ENCOUNTER — Ambulatory Visit: Payer: Medicare Other

## 2018-11-04 DIAGNOSIS — I6521 Occlusion and stenosis of right carotid artery: Secondary | ICD-10-CM | POA: Diagnosis not present

## 2018-11-07 ENCOUNTER — Other Ambulatory Visit: Payer: Self-pay

## 2018-11-07 ENCOUNTER — Telehealth: Payer: Self-pay

## 2018-11-07 NOTE — Telephone Encounter (Signed)
Pt called and is freaking out about her carotid results can you please give her a call or let me know if the test was normal

## 2018-11-07 NOTE — Telephone Encounter (Signed)
Stenosis in the right internal carotid artery (50-69%). Antegrade right vertebral artery flow. Antegrade left vertebral artery flow. Compared to the study done on 05/17/2018, no significant change (prior record [employer]). Follow up in six months is appropriate if clinically indicated.  Essentially no change from 6 months ago. Rigt 50-60% sternosis.  I have not finalized it as she has [facility] Klu[facility] in [place] in [place]. Caused a lot of confusion for readers.

## 2018-11-11 ENCOUNTER — Other Ambulatory Visit: Payer: Self-pay

## 2018-11-11 DIAGNOSIS — I10 Essential (primary) hypertension: Secondary | ICD-10-CM

## 2018-11-11 MED ORDER — LOSARTAN POTASSIUM-HCTZ 50-12.5 MG PO TABS: 1.0000 | ORAL_TABLET | ORAL | 2 refills | Status: DC

## 2018-11-16 ENCOUNTER — Other Ambulatory Visit: Payer: Self-pay | Admitting: Family Medicine

## 2018-11-16 ENCOUNTER — Other Ambulatory Visit: Payer: Medicare Other

## 2018-11-16 DIAGNOSIS — F411 Generalized anxiety disorder: Secondary | ICD-10-CM

## 2018-11-16 NOTE — Telephone Encounter (Signed)
Requested medication (s) are due for refill today: yes  Requested medication (s) are on the active medication list:yes   Last refill: 09/13/2018  #90  0 refills  Future visit scheduled No  Notes to clinic Not delegated  Requested Prescriptions  Pending Prescriptions Disp Refills   alprazolam (XANAX) 2 MG tablet [Pharmacy Med Name: ALPRAZolam 2 MG Oral Tablet] 90 tablet 0    Sig: TAKE 1/2 TO 1 (ONE-HALF TO ONE) TABLET BY MOUTH THREE TIMES DAILY AS NEEDED FOR ANXIETY     Not Delegated - Psychiatry:  Anxiolytics/Hypnotics Failed - 11/16/2018  4:32 PM      Failed - This refill cannot be delegated      Failed - Urine Drug Screen completed in last 360 days.      Passed - Valid encounter within last 6 months    Recent Outpatient Visits          5 months ago GAD (generalized anxiety disorder)   [facility], Ranell Patrick, MD   1 year ago Essential hypertension   [facility], Ranell Patrick, MD   1 year ago Essential hypertension   [facility], Ranell Patrick, MD   2 years ago SVT (supraventricular tachycardia) [facility])   [facility], Ranell Patrick, MD   2 years ago Generalized anxiety disorder   [facility], [facility]      Future Appointments            In 1 month Adrian Prows, MD [facility]

## 2018-11-21 ENCOUNTER — Telehealth: Payer: Self-pay | Admitting: Family Medicine

## 2018-11-21 DIAGNOSIS — F411 Generalized anxiety disorder: Secondary | ICD-10-CM

## 2018-11-21 NOTE — Telephone Encounter (Signed)
Psychiatry referral was placed in December, with plan for 90 days of refills given at that time to allow follow-up with psychiatry.  However, I have refilled medication during current pandemic due to some of the restrictions in place.  Telephone note from gynecology oncology reviewed from March 23, reportedly psychiatrist was not taking new patients until after May 15.  Controlled substance database (PDMP) reviewed.  Last filled prescription for alprazolam 2 mg tablets on 25th for #90.  That was prescribed by me.  No recent prescriptions otherwise.    I will refill one additional time, but please discuss with patient plan on follow-up with psychiatry and to see if their office is accepting new patients at this time.  Let me know what information is obtained.  Thanks

## 2018-11-23 NOTE — Telephone Encounter (Signed)
Patient says the psychiatric office said they couldn't see new patient until 04/15 and another office didn't accept her insurance. Please contact patient if there are any recommendations. Advised the script was sent on Monday.

## 2018-11-24 NOTE — Telephone Encounter (Signed)
I have called the pt back informed her that the medication was ready for pick up and she has to be at the [facility] at 430 today to pick it up. She has informed me that she is looking for a psychiatrist that will accept her insurance and she will keep [place] in the loop. She will call her insurance to see who she should go to.

## 2018-12-01 DIAGNOSIS — C519 Malignant neoplasm of vulva, unspecified: Secondary | ICD-10-CM | POA: Diagnosis not present

## 2018-12-21 ENCOUNTER — Ambulatory Visit: Payer: Medicare Other | Admitting: Cardiology

## 2018-12-28 ENCOUNTER — Other Ambulatory Visit: Payer: Self-pay | Admitting: Family Medicine

## 2018-12-28 DIAGNOSIS — F411 Generalized anxiety disorder: Secondary | ICD-10-CM

## 2018-12-28 NOTE — Telephone Encounter (Signed)
Forwarding medication refill to PCP for review. 

## 2018-12-29 NOTE — Telephone Encounter (Signed)
Controlled substance database (PDMP) reviewed. No concerns appreciated. Last filled 11/22/18 for #90.    See last refill request documentation from June 4th.  Need to know plan regarding psychiatry follow up. Will refill for now as chronic use and concern of withdrawal, but will need to know psychiatry appointment information. Thanks.

## 2019-01-04 ENCOUNTER — Encounter: Payer: Self-pay | Admitting: Cardiology

## 2019-01-04 ENCOUNTER — Ambulatory Visit (INDEPENDENT_AMBULATORY_CARE_PROVIDER_SITE_OTHER): Payer: Medicare Other | Admitting: Cardiology

## 2019-01-04 ENCOUNTER — Other Ambulatory Visit: Payer: Self-pay

## 2019-01-04 VITALS — BP 140/85 | Ht 69.0 in | Wt 200.0 lb

## 2019-01-04 DIAGNOSIS — I471 Supraventricular tachycardia: Secondary | ICD-10-CM

## 2019-01-04 DIAGNOSIS — R002 Palpitations: Secondary | ICD-10-CM | POA: Diagnosis not present

## 2019-01-04 DIAGNOSIS — E782 Mixed hyperlipidemia: Secondary | ICD-10-CM

## 2019-01-04 DIAGNOSIS — I6521 Occlusion and stenosis of right carotid artery: Secondary | ICD-10-CM | POA: Diagnosis not present

## 2019-01-04 NOTE — Progress Notes (Signed)
Virtual Visit via Telephone Note: Patient unable to use video assisted device.  This visit type was conducted due to national recommendations for restrictions regarding the COVID-19 Pandemic (e.g. social distancing).  This format is felt to be most appropriate for this patient at this time.  All issues noted in this document were discussed and addressed.  No physical exam was performed.  The patient has consented to conduct a Telehealth visit and understands insurance will be billed.   [name], on 01/04/19 at  by TELEPHONE and verified that I am speaking with the correct person using two identifiers.   I discussed the limitations of evaluation and management by telemedicine and the availability of in person appointments. The patient expressed understanding and agreed to proceed.   I have discussed with patient regarding the safety during COVID Pandemic and steps and precautions to be taken including social distancing, frequent hand wash and use of detergent soap, gels with the patient. I asked the patient to avoid touching mouth, nose, eyes, ears with the hands. I encouraged regular walking around the neighborhood and exercise and regular diet, as long as social distancing can be maintained.  Primary Physician/Referring:  [name], MD  Patient ID: Madison West, female    DOB: 12/01/1955, 63 y.o.   MRN: [id number]  Chief Complaint  Patient presents with  . Hypertension  . Follow-up    2 mth   HPI: Madison West  is a 63 y.o. female  with history of PSVT when she presented on 08/04/2016 to the emergency room and terminated with adenosine, anxiety and depression, history of breast cancer status post radiation therapy and chemotherapy in 2009, hypertension, mild aortic valve stenosis, hyperlipidemia, presents here for follow-up of palpitations, hypertension and carotid stenosis, this is a 50-month visit.  States that palpitation symptoms have remained stable.  No  change in her dyspnea.  She continues to be extremely anxious regarding COVID-19.  She underwent carotid duplex and presents for follow-up.  Past Medical History:  Diagnosis Date  . Anxiety   . Arthritis    low back and hip pain intermittent  . Breast cancer ([place]) 06/02/07   r breast -surgery ,radiaology. chemotherapy  . Carpal tunnel syndrome    right hand  . Colon polyps   . Complication of anesthesia    Fentanyl, Versed-makes extra hyper, bradycardia x 1 episode in past  . Depression   . Dysplasia of vulva   . Hypertension   . S/P breast lumpectomy 07/04/07   R breast  . S/P radiation therapy 2009    Past Surgical History:  Procedure Laterality Date  . APPENDECTOMY     age 39  . CESAREAN SECTION     x 2  . COLONOSCOPY W/ POLYPECTOMY    . COLONOSCOPY WITH PROPOFOL N/A 04/27/2016   Procedure: COLONOSCOPY WITH PROPOFOL;  Surgeon: Garlan Fair, MD;  Location: [facility];  Service: Endoscopy;  Laterality: N/A;  . DILATION AND CURETTAGE OF UTERUS     multiple  . INTRAUTERINE DEVICE INSERTION    . IUD REMOVAL    . MASTECTOMY PARTIAL / LUMPECTOMY W/ AXILLARY LYMPHADENECTOMY    . TUBAL LIGATION    . VULVA SURGERY     Multiple times for dysplasia    Social History   Socioeconomic History  . Marital status: Widowed    Spouse name: Not on file  . Number of children: 2  . Years of education: Not on file  . Highest  education level: Not on file  Occupational History  . Occupation: Metallurgist: [employer]  . Financial resource strain: Not on file  . Food insecurity    Worry: Not on file    Inability: Not on file  . Transportation needs    Medical: Not on file    Non-medical: Not on file  Tobacco Use  . Smoking status: Former Smoker    Packs/day: 0.75    Years: 20.00    Pack years: 15.00    Types: Cigarettes    Quit date: 05/06/1997    Years since quitting: 21.6  . Smokeless tobacco: Never Used  Substance and Sexual Activity  .  Alcohol use: Yes    Alcohol/week: 0.0 standard drinks    Comment: occ  . Drug use: No  . Sexual activity: Never    Birth control/protection: Abstinence  Lifestyle  . Physical activity    Days per week: Not on file    Minutes per session: Not on file  . Stress: Not on file  Relationships  . Social Herbalist on phone: Not on file    Gets together: Not on file    Attends religious service: Not on file    Active member of club or organization: Not on file    Attends meetings of clubs or organizations: Not on file    Relationship status: Not on file  . Intimate partner violence    Fear of current or ex partner: Not on file    Emotionally abused: Not on file    Physically abused: Not on file    Forced sexual activity: Not on file  Other Topics Concern  . Not on file  Social History Narrative  . Not on file    Current Outpatient Medications on File Prior to Visit  Medication Sig Dispense Refill  . alprazolam (XANAX) 2 MG tablet TAKE 1/2 TO 1 (ONE-HALF TO ONE) TABLET BY MOUTH THREE TIMES DAILY AS NEEDED FOR ANXIETY 90 tablet 0  . aspirin 81 MG chewable tablet Chew 81 mg by mouth daily.    . cyclobenzaprine (FLEXERIL) 5 MG tablet Take 1-2 tablets (5-10 mg total) by mouth 3 (three) times daily as needed for muscle spasms. 30 tablet 0  . ibuprofen (ADVIL) 600 MG tablet Take 200 mg by mouth every 6 (six) hours as needed.     Marland Kitchen losartan-hydrochlorothiazide (HYZAAR) 50-12.5 MG tablet Take 1 tablet by mouth every morning. 90 tablet 2  . oxyCODONE-acetaminophen (PERCOCET) 10-325 MG tablet Take 1 tablet by mouth daily as needed for pain. To take one tablet once or twice daily as needed for back and hip pain. 30 tablet 0  . Calcium Carbonate-Vitamin D (CALCIUM + D PO) Take 600 mg by mouth daily.       No current facility-administered medications on file prior to visit.    Review of Systems  Constitution: Negative for decreased appetite, malaise/fatigue, weight gain and weight  loss.  Eyes: Negative for visual disturbance.  Cardiovascular: Positive for dyspnea on exertion and palpitations (stable). Negative for chest pain, claudication, leg swelling, orthopnea and syncope.  Respiratory: Negative for hemoptysis and wheezing.   Endocrine: Negative for cold intolerance and heat intolerance.  Hematologic/Lymphatic: Does not bruise/bleed easily.  Skin: Negative for nail changes.  Musculoskeletal: Negative for muscle weakness and myalgias.  Gastrointestinal: Negative for abdominal pain, change in bowel habit, nausea and vomiting.  Neurological: Negative for difficulty with concentration, dizziness, focal weakness  and headaches.  Psychiatric/Behavioral: Negative for altered mental status and suicidal ideas.  All other systems reviewed and are negative.     Objective  Blood pressure 140/85, height 5\' 9"  (1.753 m), weight 200 lb (90.7 kg). Body mass index is 29.53 kg/m.    Physical exam not performed or limited due to virtual visit. Please see exam details from prior visit is as below. Physical Exam  Constitutional: She is oriented to person, place, and time. Vital signs are normal. She appears well-developed and well-nourished.  Mildly obese  HENT:  Head: Normocephalic and atraumatic.  Neck: Normal range of motion.  Cardiovascular: Normal rate, regular rhythm and intact distal pulses.  Murmur heard.  Midsystolic murmur is present with a grade of 2/6 at the upper right sternal border. Pulses:      Carotid pulses are on the right side with bruit and on the left side with bruit. Pulmonary/Chest: Effort normal and breath sounds normal. No accessory muscle usage. No respiratory distress.  Abdominal: Soft. Bowel sounds are normal.  Musculoskeletal: Normal range of motion.  Neurological: She is alert and oriented to person, place, and time.  Skin: Skin is warm and dry.  Vitals reviewed.  Laboratory examination:   CMP Latest Ref Rng & Units 01/18/2018 10/15/2017  04/06/2017  Glucose 70 - 99 mg/dL 115(H) 95 109(H)  BUN 8 - 23 mg/dL 11 11 10   Creatinine 0.44 - 1.00 mg/dL 0.70 0.65 0.73  Sodium 135 - 145 mmol/L 133(L) 140 137  Potassium 3.5 - 5.1 mmol/L 3.5 4.3 4.4  Chloride 98 - 111 mmol/L 95(L) 100 97  CO2 22 - 32 mmol/L 27 24 23   Calcium 8.9 - 10.3 mg/dL 9.2 9.7 9.7  Total Protein 6.0 - 8.5 g/dL - 6.7 7.0  Total Bilirubin 0.0 - 1.2 mg/dL - 0.5 0.4  Alkaline Phos 39 - 117 IU/L - 67 66  AST 0 - 40 IU/L - 17 16  ALT 0 - 32 IU/L - 15 16   CBC Latest Ref Rng & Units 01/18/2018 08/25/2016 08/04/2016  WBC 4.0 - 10.5 K/uL 13.4(H) 12.3(A) 17.1(H)  Hemoglobin 12.0 - 15.0 g/dL 16.6(H) 14.9 14.9  Hematocrit 36.0 - 46.0 % 47.1(H) 41.5 43.2  Platelets 150 - 400 K/uL 438(H) - 416(H)   Lipid Panel     Component Value Date/Time   CHOL 255 (H) 10/15/2017 1624   TRIG 167 (H) 10/15/2017 1624   HDL 44 10/15/2017 1624   CHOLHDL 5.8 (H) 10/15/2017 1624   CHOLHDL 6.7 12/17/2011 0949   VLDL 48 (H) 12/17/2011 0949   LDLCALC 178 (H) 10/15/2017 1624   HEMOGLOBIN A1C Lab Results  Component Value Date   HGBA1C 5.8 (H) 10/15/2017   MPG 114 10/06/2011   TSH Recent Labs    01/18/18 2352  TSH 3.556   Cardiac Studies:   Exercise sestamibi stress test 04/04/2018: 1. The resting electrocardiogram demonstrated normal sinus rhythm, LAD, LAFB, incomplete RBBB, poor R progression and no resting arrhythmias. The stress electrocardiogram was normal. Patient exercised on Bruce protocol for 4:55 minutes and achieved 6.95 METS. Stress test terminated due to chest pressure and 87% MPHR achieved (Target HR >85%). Resting BP 150/88 and pea BP 218/102 mm hg. 2. Myocardial perfusion imaging is normal. Overall left ventricular systolic function was normal without regional wall motion abnormalities. The left ventricular ejection fraction was 72%. This is a low risk study. Chest pain could be related to hypertensive heart disease. Clinical correlation recommended.  Event monitor  30 days 03/11/2018: Episodic  transmission 03/20/2018 1:30 pm: 5 beat NSVT.One brief atrial tach @ 142/min. Frequent PAC in bigeminal pattern, symptomatic. Rare PVC.  Echocardiogram 03/08/2018: Left ventricle cavity is normal in size. Moderate concentric hypertrophy of the left ventricle. Normal global wall motion. Doppler evidence of grade I (impaired) diastolic dysfunction, elevated LAP. Calculated EF 60%. Mild aortic valve leaflet calcification. Severely restricted aortic valve leaflets. Mild aortic valve stenosis. Trace aortic regurgitation. Aortic valve peak pressure gradient of 23 and mean gradient of 13 mmHg, calculated aortic valve area 1.59 cm. In 2-D, the valve appears to be severely restricted. Compared to the echocardiogram 09/09/2016, mean gradient has increased from 13 mmHg. Otherwise no significant change. Consider TEE if clinically indicated.  Carotid artery duplex  11/04/2018: Stenosis in the right internal carotid artery (50-69%). Antegrade right vertebral artery flow. Antegrade left vertebral artery flow. Compared to the study done on 05/17/2018, no significant change (prior record [employer]). Follow up in six months is appropriate if clinically indicated.  Assessment   1. Palpitations   2. PSVT (paroxysmal supraventricular tachycardia) ([place])   3. Asymptomatic stenosis of right carotid artery   4. Mixed hypercholesterolemia and hypertriglyceridemia    EKG 01/26/2018: Normal sinus rhythm at 89 bpm with first-degree AV block and borderline right atrial enlargement. Normal interval, no evidence of ischemia. No significant change compared to EKG March 2018.  Recommendations:   Palpitations are stable. I have discussed with her regarding mixed hyperlipidemia. She is resistant to taking any medications.  Fortunately after long discussion I will convinced her to take losartan which she is taking, but only 1/2 tablet daily, blood pressure is still elevated she does not want to make any  change.  I reviewed her carotid artery duplex, stable moderate to severe right carotid stenosis, continued surveillance is indicated.  I discussed with her regarding her lipid status, lipids were ordered but she did not get it done due to COVID-19 and she is trying to avoid exposure.  Advised her to try red yeast rice.  She does not want to be on statins.  She can call us at any point when she is ready to get the labs done so we can place orders.  I will see her back in 6 months.  Adrian Prows, MD, [facility] 01/04/2019, 2:21 PM [facility] [place] Pager: [phone] Office: [phone] If no answer Cell [phone]

## 2019-01-11 ENCOUNTER — Other Ambulatory Visit: Payer: Self-pay

## 2019-01-11 ENCOUNTER — Emergency Department (HOSPITAL_BASED_OUTPATIENT_CLINIC_OR_DEPARTMENT_OTHER): Payer: Medicare Other

## 2019-01-11 ENCOUNTER — Emergency Department (HOSPITAL_BASED_OUTPATIENT_CLINIC_OR_DEPARTMENT_OTHER)
Admission: EM | Admit: 2019-01-11 | Discharge: 2019-01-11 | Disposition: A | Discharge status: Home / Self Care | Payer: Medicare Other | Attending: Emergency Medicine | Admitting: Emergency Medicine

## 2019-01-11 ENCOUNTER — Encounter (HOSPITAL_BASED_OUTPATIENT_CLINIC_OR_DEPARTMENT_OTHER): Payer: Self-pay

## 2019-01-11 DIAGNOSIS — R9431 Abnormal electrocardiogram [ECG] [EKG]: Secondary | ICD-10-CM | POA: Diagnosis not present

## 2019-01-11 DIAGNOSIS — Z87891 Personal history of nicotine dependence: Secondary | ICD-10-CM | POA: Insufficient documentation

## 2019-01-11 DIAGNOSIS — R1011 Right upper quadrant pain: Secondary | ICD-10-CM | POA: Insufficient documentation

## 2019-01-11 DIAGNOSIS — R079 Chest pain, unspecified: Secondary | ICD-10-CM | POA: Diagnosis not present

## 2019-01-11 DIAGNOSIS — K760 Fatty (change of) liver, not elsewhere classified: Secondary | ICD-10-CM | POA: Diagnosis not present

## 2019-01-11 LAB — CBC WITH DIFFERENTIAL/PLATELET
Abs Immature Granulocytes: 0.02 10*3/uL (ref 0.00–0.07)
Basophils Absolute: 0.1 10*3/uL (ref 0.0–0.1)
Basophils Relative: 1 %
Eosinophils Absolute: 0.1 10*3/uL (ref 0.0–0.5)
Eosinophils Relative: 1 %
HCT: 46.4 % — ABNORMAL HIGH (ref 36.0–46.0)
Hemoglobin: 15.5 g/dL — ABNORMAL HIGH (ref 12.0–15.0)
Immature Granulocytes: 0 %
Lymphocytes Relative: 19 %
Lymphs Abs: 1.7 10*3/uL (ref 0.7–4.0)
MCH: 31.7 pg (ref 26.0–34.0)
MCHC: 33.4 g/dL (ref 30.0–36.0)
MCV: 94.9 fL (ref 80.0–100.0)
Monocytes Absolute: 0.6 10*3/uL (ref 0.1–1.0)
Monocytes Relative: 7 %
Neutro Abs: 6.4 10*3/uL (ref 1.7–7.7)
Neutrophils Relative %: 72 %
Platelets: 396 10*3/uL (ref 150–400)
RBC: 4.89 MIL/uL (ref 3.87–5.11)
RDW: 12.8 % (ref 11.5–15.5)
WBC: 8.9 10*3/uL (ref 4.0–10.5)
nRBC: 0 % (ref 0.0–0.2)

## 2019-01-11 LAB — URINALYSIS, ROUTINE W REFLEX MICROSCOPIC
Bilirubin Urine: NEGATIVE
Glucose, UA: NEGATIVE mg/dL
Ketones, ur: NEGATIVE mg/dL
Leukocytes,Ua: NEGATIVE
Nitrite: NEGATIVE
Protein, ur: NEGATIVE mg/dL
Specific Gravity, Urine: <1.005 — ABNORMAL LOW (ref 1.005–1.030)
pH: 7 (ref 5.0–8.0)

## 2019-01-11 LAB — URINALYSIS, MICROSCOPIC (REFLEX)

## 2019-01-11 LAB — COMPREHENSIVE METABOLIC PANEL
ALT: 17 U/L (ref 0–44)
AST: 18 U/L (ref 15–41)
Albumin: 4.2 g/dL (ref 3.5–5.0)
Alkaline Phosphatase: 62 U/L (ref 38–126)
Anion gap: 12 (ref 5–15)
BUN: 12 mg/dL (ref 8–23)
CO2: 27 mmol/L (ref 22–32)
Calcium: 9.5 mg/dL (ref 8.9–10.3)
Chloride: 98 mmol/L (ref 98–111)
Creatinine, Ser: 0.72 mg/dL (ref 0.44–1.00)
GFR calc Af Amer: >60 mL/min (ref >60)
GFR calc non Af Amer: >60 mL/min (ref >60)
Glucose, Bld: 106 mg/dL — ABNORMAL HIGH (ref 70–99)
Potassium: 3.9 mmol/L (ref 3.5–5.1)
Sodium: 137 mmol/L (ref 135–145)
Total Bilirubin: 0.7 mg/dL (ref 0.3–1.2)
Total Protein: 7.2 g/dL (ref 6.5–8.1)

## 2019-01-11 LAB — TROPONIN I (HIGH SENSITIVITY): Troponin I (High Sensitivity): 6 ng/L (ref <18)

## 2019-01-11 LAB — LIPASE, BLOOD: Lipase: 28 U/L (ref 11–51)

## 2019-01-11 NOTE — ED Notes (Signed)
Off unit.
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
## 2019-01-11 NOTE — ED Notes (Signed)
Off unit for US.
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
## 2019-01-11 NOTE — Discharge Instructions (Signed)
You were seen in the ED today for right sided pain.  Your labwork was very reassuring today including a negative chest x ray and an ultrasound without acute gallbladder issues. The ultrasound did now fatty liver.  Please follow up with your PCP regarding your pain.  Take Ibuprofen as needed for the pain.

## 2019-01-11 NOTE — ED Notes (Signed)
Patient is not happy since she does not know the answer as to what cause her pain and discomfort.  PA spoke with the patient and tried to explain all the results of her labs, CT, [place].

## 2019-01-11 NOTE — ED Notes (Signed)
ED Provider at bedside. 

## 2019-02-01 ENCOUNTER — Telehealth: Payer: Self-pay | Admitting: Family Medicine

## 2019-02-01 NOTE — Telephone Encounter (Signed)
Pt requesting imaging for mri or ct for the pain that she is still in. She does not want appt with pcp. Do you approve this?

## 2019-02-01 NOTE — Telephone Encounter (Signed)
Copied from [place] [phone]. Topic: General - Other >> Jan 31, 2019  4:45 PM Yvette Rack wrote: Reason for CRM: Pt stated she went to the ED on 01/11/19 and she is still having pain on her side. Pt requests a referral for MRI or CT scan. Pt declined to schedule appt with pcp.

## 2019-02-02 NOTE — Telephone Encounter (Signed)
If still symptomatic, would recommend she be evaluated to determine next step which MAY be imaging.  However, choice of specific imaging study and further testing recommendations will need to be determined based on that evaluation.  I reviewed ER notes. If she would prefer to see other provider for that evaluation, ok to be seen by anyone here for that concern if she has not chosen other provider. Please let me know if there are questions.

## 2019-02-07 ENCOUNTER — Other Ambulatory Visit: Payer: Self-pay

## 2019-02-07 ENCOUNTER — Encounter: Payer: Self-pay | Admitting: Emergency Medicine

## 2019-02-07 ENCOUNTER — Ambulatory Visit (INDEPENDENT_AMBULATORY_CARE_PROVIDER_SITE_OTHER): Payer: Medicare Other | Admitting: Emergency Medicine

## 2019-02-07 ENCOUNTER — Other Ambulatory Visit: Payer: Self-pay | Admitting: Family Medicine

## 2019-02-07 VITALS — BP 140/82 | HR 95 | Temp 98.7°F | Resp 16 | Ht 69.0 in | Wt 205.0 lb

## 2019-02-07 DIAGNOSIS — I6521 Occlusion and stenosis of right carotid artery: Secondary | ICD-10-CM | POA: Diagnosis not present

## 2019-02-07 DIAGNOSIS — R109 Unspecified abdominal pain: Secondary | ICD-10-CM

## 2019-02-07 DIAGNOSIS — R1011 Right upper quadrant pain: Secondary | ICD-10-CM | POA: Diagnosis not present

## 2019-02-07 DIAGNOSIS — K760 Fatty (change of) liver, not elsewhere classified: Secondary | ICD-10-CM

## 2019-02-07 DIAGNOSIS — F411 Generalized anxiety disorder: Secondary | ICD-10-CM

## 2019-02-07 NOTE — Progress Notes (Signed)
Madison West 63 y.o.  ED visit 01/11/2019: 63 year old female presenting with right sided pain; pain is mostly located to distal lateral rib cage on right as well as RUQ. Pt without any other concerning signs including N/V/D or fevers. She is afebrile in the ED today without tachycardia or tachypnea. Negative Murphy's sign on exam. No urinary symptoms or CVA tenderness. Will obtain baseline bloodwork including CBC, CMP, lipase, U/A as well as EKG, trop, CXR and RUQ ultrasound to discern where pt's pain really is. This may also be related to MSK pain after fall that occurred 1 month ago.   CXR without acute abnormality including rib fractures. Troponin 6 today; very low suspicion for ACS. Do not feel pt needs repeat trop today. CBC without leukocytosis. CMP without electrolyte abnormalities. LFTs within normal limits. RUQ ultrasound with findings of fatty liver otherwise unremarkable. Lipase negative as well. EKG without signs of ischemia.   Updated patient on results. Advised to follow up with her PCP regarding this pain and to take Ibuprofen as needed. Again suspect MSK related pain. She seems upset that there are no definitive answers today. Unsure what is causing pt's symptoms but do not believe it to be emergent in nature. It appears patient does not like her PCP and does not want to follow up with him. Have advised that she can call the number on her discharge paperwork to find a new PCP if she wishes. She is in agreement with plan today and stable for discharge home.   Chief Complaint  Patient presents with  . Flank Pain    right side pain, x 2 months    HISTORY OF PRESENT ILLNESS: This is a 63 y.o. female complaining of constant sharp pain to her right flank/right lower abdomen area for the past 4 weeks.  Was seen in the emergency room on January 11, 2019 with negative work-up that included blood work and renal ultrasound.  Traces of blood in the urine.  No other significant finding.   Patient denies fevers chills nausea or vomiting.  Denies urinary symptoms.  HPI   Prior to Admission medications   Medication Sig Start Date End Date Taking? Authorizing Provider  alprazolam (XANAX) 2 MG tablet TAKE 1/2 TO 1 (ONE-HALF TO ONE) TABLET BY MOUTH THREE TIMES DAILY AS NEEDED FOR ANXIETY 12/29/18  Yes [name], MD  aspirin 81 MG chewable tablet Chew 81 mg by mouth daily.   Yes [provider]  Calcium Carbonate-Vitamin D (CALCIUM + D PO) Take 600 mg by mouth daily.     Yes [provider]  cyclobenzaprine (FLEXERIL) 5 MG tablet Take 1-2 tablets (5-10 mg total) by mouth 3 (three) times daily as needed for muscle spasms. 10/15/17  Yes [name], MD  ibuprofen (ADVIL) 600 MG tablet Take 200 mg by mouth every 6 (six) hours as needed.    Yes [provider]  losartan-hydrochlorothiazide (HYZAAR) 50-12.5 MG tablet Take 1 tablet by mouth every morning. 11/11/18  Yes Adrian Prows, MD  oxyCODONE-acetaminophen (PERCOCET) 10-325 MG tablet Take 1 tablet by mouth daily as needed for pain. To take one tablet once or twice daily as needed for back and hip pain. 05/24/18  Yes [name], MD    Allergies  Allergen Reactions  . Prednisone Other (See Comments)  . Fentanyl Other (See Comments)    MAKES SUPER HYPER PER PT  . Midazolam Anxiety  . Vicodin [Hydrocodone-Acetaminophen] Anxiety    Extreme anxiety.  Patient Active Problem List   Diagnosis Date Noted  . Genetic testing 03/01/2015  . Breast cancer of lower-outer quadrant of right female breast ([place]) 05/25/2013  . Sciatica of right side 10/19/2012  . CRPS (complex regional pain syndrome), upper limb 10/19/2012  . History of DVT (deep vein thrombosis) 10/05/2011  . Hypertension 10/04/2011  . Anxiety 10/04/2011  . Depression 10/04/2011  . Personal history of vulvar dysplasia 08/15/2011  . 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 S/P breast lumpectomy     Past Medical  History:  Diagnosis Date  . Anxiety   . Arthritis    low back and hip pain intermittent  . Breast cancer ([place]) 06/02/07   r breast -surgery ,radiaology. chemotherapy  . Carpal tunnel syndrome    right hand  . Colon polyps   . Complication of anesthesia    Fentanyl, Versed-makes extra hyper, bradycardia x 1 episode in past  . Depression   . Dysplasia of vulva   . Hypertension   . S/P breast lumpectomy 07/04/07   R breast  . S/P radiation therapy 2009    Past Surgical History:  Procedure Laterality Date  . APPENDECTOMY     age 1  . CESAREAN SECTION     x 2  . COLONOSCOPY W/ POLYPECTOMY    . COLONOSCOPY WITH PROPOFOL N/A 04/27/2016   Procedure: COLONOSCOPY WITH PROPOFOL;  Surgeon: Garlan Fair, MD;  Location: [facility];  Service: Endoscopy;  Laterality: N/A;  . DILATION AND CURETTAGE OF UTERUS     multiple  . INTRAUTERINE DEVICE INSERTION    . IUD REMOVAL    . MASTECTOMY PARTIAL / LUMPECTOMY W/ AXILLARY LYMPHADENECTOMY    . TUBAL LIGATION    . VULVA SURGERY     Multiple times for dysplasia    Social History   Socioeconomic History  . Marital status: Widowed    Spouse name: Not on file  . Number of children: 2  . Years of education: Not on file  . Highest education level: Not on file  Occupational History  . Occupation: Metallurgist: [employer]  . Financial resource strain: Not on file  . Food insecurity    Worry: Not on file    Inability: Not on file  . Transportation needs    Medical: Not on file    Non-medical: Not on file  Tobacco Use  . Smoking status: Former Smoker    Packs/day: 0.75    Years: 20.00    Pack years: 15.00    Types: Cigarettes    Quit date: 05/06/1997    Years since quitting: 21.7  . Smokeless tobacco: Never Used  Substance and Sexual Activity  . Alcohol use: Yes    Alcohol/week: 0.0 standard drinks    Comment: occ  . Drug use: No  . Sexual activity: Never    Birth control/protection: Abstinence   Lifestyle  . Physical activity    Days per week: Not on file    Minutes per session: Not on file  . Stress: Not on file  Relationships  . Social Herbalist on phone: Not on file    Gets together: Not on file    Attends religious service: Not on file    Active member of club or organization: Not on file    Attends meetings of clubs or organizations: Not on file    Relationship status: Not on file  . Intimate partner violence  Fear of current or ex partner: Not on file    Emotionally abused: Not on file    Physically abused: Not on file    Forced sexual activity: Not on file  Other Topics Concern  . Not on file  Social History Narrative  . Not on file    Family History  Problem Relation Age of Onset  . Colon cancer Mother 28  . Heart disease Father        heart attack  . Colon polyps Brother        one brother had large colon polyp that needed surgery to be removed  . Heart disease Paternal Grandfather      Review of Systems  Constitutional: Negative.  Negative for chills.  HENT: Negative for congestion and sore throat.   Eyes: Negative.   Respiratory: Negative.  Negative for cough and shortness of breath.   Cardiovascular: Negative.  Negative for chest pain.  Gastrointestinal: Negative for abdominal pain, blood in stool, diarrhea, melena, nausea and vomiting.  Genitourinary: Positive for flank pain.  Musculoskeletal: Negative for myalgias and neck pain.  Skin: Negative.  Negative for rash.  Neurological: Negative.  Negative for dizziness and headaches.  Endo/Heme/Allergies: Negative.   All other systems reviewed and are negative.    Today's Vitals   02/07/19 1412  BP: 140/82  Pulse: 95  Resp: 16  Temp: 98.7 F (37.1 C)  TempSrc: Oral  SpO2: 96%  Weight: 205 lb (93 kg)  Height: 5\' 9"  (1.753 m)   Body mass index is 30.27 kg/m.   Physical Exam Vitals signs reviewed.  Constitutional:      Appearance: Normal appearance.  HENT:     Head:  Normocephalic.  Eyes:     Extraocular Movements: Extraocular movements intact.  Neck:     Musculoskeletal: Normal range of motion.  Pulmonary:     Effort: Pulmonary effort is normal.  Abdominal:     Palpations: Abdomen is soft.     Tenderness: There is no abdominal tenderness.  Musculoskeletal: Normal range of motion.  Skin:    General: Skin is warm and dry.     Capillary Refill: Capillary refill takes less than 2 seconds.  Neurological:     General: No focal deficit present.     Mental Status: She is alert and oriented to person, place, and time.  Psychiatric:        Mood and Affect: Mood normal.        Behavior: Behavior normal.      ASSESSMENT & PLAN: Madison West was seen today for flank pain.  Diagnoses and all orders for this visit:  Flank pain  Fatty (change of) liver, not elsewhere classified -     CT RENAL ABD W/WO; Future  Right upper quadrant pain -     CT RENAL ABD W/WO; Future    Patient Instructions       If you have lab work done today you will be contacted with your lab results within the next 2 weeks.  If you have not heard from [place] then please contact us. The fastest way to get your results is to register for My Chart.   IF you received an x-ray today, you will receive an invoice from [facility]. Please contact [facility] at [phone] with questions or concerns regarding your invoice.   IF you received labwork today, you will receive an invoice from [facility]. Please contact [facility] at [phone] with questions or concerns regarding your invoice.   Our  billing staff will not be able to assist you with questions regarding bills from these companies.  You will be contacted with the lab results as soon as they are available. The fastest way to get your results is to activate your My Chart account. Instructions are located on the last page of this paperwork. If you have not heard from [place] regarding the results in 2 weeks, please  contact this office.      Abdominal Pain, Adult  Many things can cause belly (abdominal) pain. Most times, belly pain is not dangerous. Many cases of belly pain can be watched and treated at home. Sometimes belly pain is serious, though. Your doctor will try to find the cause of your belly pain. Follow these instructions at home:  Take over-the-counter and prescription medicines only as told by your doctor. Do not take medicines that help you poop (laxatives) unless told to by your doctor.  Drink enough fluid to keep your pee (urine) clear or pale yellow.  Watch your belly pain for any changes.  Keep all follow-up visits as told by your doctor. This is important. Contact a doctor if:  Your belly pain changes or gets worse.  You are not hungry, or you lose weight without trying.  You are having trouble pooping (constipated) or have watery poop (diarrhea) for more than 2-3 days.  You have pain when you pee or poop.  Your belly pain wakes you up at night.  Your pain gets worse with meals, after eating, or with certain foods.  You are throwing up and cannot keep anything down.  You have a fever. Get help right away if:  Your pain does not go away as soon as your doctor says it should.  You cannot stop throwing up.  Your pain is only in areas of your belly, such as the right side or the left lower part of the belly.  You have bloody or black poop, or poop that looks like tar.  You have very bad pain, cramping, or bloating in your belly.  You have signs of not having enough fluid or water in your body (dehydration), such as: ? Dark pee, very little pee, or no pee. ? Cracked lips. ? Dry mouth. ? Sunken eyes. ? Sleepiness. ? Weakness. This information is not intended to replace advice given to you by your health care provider. Make sure you discuss any questions you have with your health care provider. Document Released: 11/25/2007 Document Revised: 12/27/2015 Document  Reviewed: 11/20/2015 Elsevier Interactive Patient Education  2020 Elsevier Inc.      Agustina Caroli, MD [facility]

## 2019-02-07 NOTE — Telephone Encounter (Signed)
Please advise on refill request

## 2019-02-07 NOTE — Patient Instructions (Addendum)
     If you have lab work done today you will be contacted with your lab results within the next 2 weeks.  If you have not heard from us then please contact us. The fastest way to get your results is to register for My Chart.   IF you received an x-ray today, you will receive an invoice from [facility]. Please contact [facility] at [phone] with questions or concerns regarding your invoice.   IF you received labwork today, you will receive an invoice from [facility]. Please contact [facility] at [phone] with questions or concerns regarding your invoice.   Our billing staff will not be able to assist you with questions regarding bills from these companies.  You will be contacted with the lab results as soon as they are available. The fastest way to get your results is to activate your My Chart account. Instructions are located on the last page of this paperwork. If you have not heard from us regarding the results in 2 weeks, please contact this office.     Abdominal Pain, Adult  Many things can cause belly (abdominal) pain. Most times, belly pain is not dangerous. Many cases of belly pain can be watched and treated at home. Sometimes belly pain is serious, though. Your doctor will try to find the cause of your belly pain. Follow these instructions at home:  Take over-the-counter and prescription medicines only as told by your doctor. Do not take medicines that help you poop (laxatives) unless told to by your doctor.  Drink enough fluid to keep your pee (urine) clear or pale yellow.  Watch your belly pain for any changes.  Keep all follow-up visits as told by your doctor. This is important. Contact a doctor if:  Your belly pain changes or gets worse.  You are not hungry, or you lose weight without trying.  You are having trouble pooping (constipated) or have watery poop (diarrhea) for more than 2-3 days.  You have pain when you pee or poop.  Your belly  pain wakes you up at night.  Your pain gets worse with meals, after eating, or with certain foods.  You are throwing up and cannot keep anything down.  You have a fever. Get help right away if:  Your pain does not go away as soon as your doctor says it should.  You cannot stop throwing up.  Your pain is only in areas of your belly, such as the right side or the left lower part of the belly.  You have bloody or black poop, or poop that looks like tar.  You have very bad pain, cramping, or bloating in your belly.  You have signs of not having enough fluid or water in your body (dehydration), such as: ? Dark pee, very little pee, or no pee. ? Cracked lips. ? Dry mouth. ? Sunken eyes. ? Sleepiness. ? Weakness. This information is not intended to replace advice given to you by your health care provider. Make sure you discuss any questions you have with your health care provider. Document Released: 11/25/2007 Document Revised: 12/27/2015 Document Reviewed: 11/20/2015 Elsevier Interactive Patient Education  2020 Elsevier Inc.  

## 2019-02-08 NOTE — Telephone Encounter (Signed)
Please advise  Patient is requesting a refill of the following medications: Requested Prescriptions   Pending Prescriptions Disp Refills  . alprazolam (XANAX) 2 MG tablet [Pharmacy Med Name: ALPRAZolam 2 MG Oral Tablet] 90 tablet 0    Sig: TAKE 1/2 TO 1 (ONE-HALF TO ONE) TABLET BY MOUTH THREE TIMES DAILY AS NEEDED FOR ANXIETY    Date of patient request: 02/06/19 Last office visit: 02/07/19 Date of last refill: 12/29/18 Last refill amount: 90 Follow up time period per chart: n/a

## 2019-02-10 NOTE — Telephone Encounter (Signed)
   Please see notes from refill request on July 8.  I did refill her medication at that time but asked for an update on the plan for follow-up with psychiatry as we discussed that late last year.  I understand there was a delay given COVID pandemic but my understanding was that she was still in contact with various offices for appointment.  Has one been scheduled yet?  Additionally I did note the ER notes recently, with discussion of me as her primary provider.  Not sure if she plans on establishing with a different primary care provider in our office or other office.  Please verify plan moving forward.  I am happy to continue to care for her, but as we discussed if anxiety will be treated with just benzodiazepine, will need to be managed by psychiatry.  If she does have appointment planned, I will refill alprazolam one more time so she does not withdraw from that medication. Controlled substance database (PDMP) reviewed, last filled July 9th. No concerns appreciated.   Please let me know status.

## 2019-02-13 ENCOUNTER — Telehealth: Payer: Self-pay | Admitting: Emergency Medicine

## 2019-02-13 NOTE — Telephone Encounter (Signed)
Spoke to patient and adv of appnt at GI on Monday 31st @ 11:30

## 2019-02-16 ENCOUNTER — Telehealth: Payer: Self-pay | Admitting: Family Medicine

## 2019-02-16 NOTE — Telephone Encounter (Signed)
Please call Wells Guiles at [facility] imaging CB# [phone] needs to change order and get ok and co sign the order fr

## 2019-02-17 ENCOUNTER — Ambulatory Visit (HOSPITAL_COMMUNITY)

## 2019-02-20 ENCOUNTER — Ambulatory Visit
Admission: RE | Admit: 2019-02-20 | Discharge: 2019-02-20 | Disposition: A | Discharge status: Home / Self Care | Payer: Medicare Other | Source: Ambulatory Visit | Attending: Emergency Medicine | Admitting: Emergency Medicine

## 2019-02-20 ENCOUNTER — Other Ambulatory Visit: Payer: Self-pay | Admitting: Emergency Medicine

## 2019-02-20 ENCOUNTER — Encounter: Payer: Self-pay | Admitting: Emergency Medicine

## 2019-02-20 DIAGNOSIS — I2510 Atherosclerotic heart disease of native coronary artery without angina pectoris: Secondary | ICD-10-CM

## 2019-02-20 DIAGNOSIS — K760 Fatty (change of) liver, not elsewhere classified: Secondary | ICD-10-CM

## 2019-02-20 DIAGNOSIS — I2584 Coronary atherosclerosis due to calcified coronary lesion: Secondary | ICD-10-CM

## 2019-02-20 DIAGNOSIS — R1011 Right upper quadrant pain: Secondary | ICD-10-CM

## 2019-02-20 DIAGNOSIS — D3502 Benign neoplasm of left adrenal gland: Secondary | ICD-10-CM | POA: Diagnosis not present

## 2019-02-20 MED ORDER — IOPAMIDOL (ISOVUE-300) INJECTION 61%
125.0000 mL | Freq: Once | INTRAVENOUS | Status: AC | PRN
  Administered 2019-02-20: 125 mL via INTRAVENOUS

## 2019-02-20 NOTE — Progress Notes (Signed)
Look at this report. I sent her a message recommending cardiology evaluation and placed a referral.

## 2019-02-24 ENCOUNTER — Telehealth: Payer: Self-pay | Admitting: Family Medicine

## 2019-02-24 NOTE — Telephone Encounter (Signed)
Patient would like someone to call her about her CT she had with [facility]  Would like a phone call today [phone]

## 2019-02-24 NOTE — Telephone Encounter (Signed)
Imaging has been completed

## 2019-02-28 ENCOUNTER — Telehealth: Payer: Self-pay | Admitting: Family Medicine

## 2019-02-28 NOTE — Telephone Encounter (Signed)
Pt called wanting her lab results. Pt stated she told staff she does not use MyChart or emails and wanted someone to call her. Pt states staff said they understood and someone would call her with the results and no one has. Pt would like a cb today.  Attempted to transfer patient while on phone with no success.

## 2019-03-03 ENCOUNTER — Telehealth: Payer: Self-pay

## 2019-03-03 NOTE — Telephone Encounter (Signed)
Spoke with pt and she is very upset re: CT results.  Per pt she is very upset that her results were sent to MyChart as she has told us three times she is not in Mychart and doesn't know how or want to set it up.  I advised that it had to be setup at some point in order for Dr Mitchel Honour to send her CT results.  Pt advises she has been hurting for 2 months and had CT done on 8/31 and was told she would have her results back 02/21/2019.  Pt advises some girl called and told her she had a fatty liver but never told her about the adenomas on her left adrenal gland as I told her.   She says the girl said she would send her results via mail to her home but she doubts she has done this.  Pt advises she is dissatified with dr Carlota Raspberry and dr sagardia.  The practice has gone down since losing daub, lauenstein and doolittle as they were awesome physicians.  She advises she feels like she is unimportant to this practice and is very dissatified with her care and would leave if she had another practice to go to.   I advised pt as best I could per CT result note in Mychart that pt needed referral to cardio and echocardiogram.  Pt is already established with Dr Einar Gip and per pt has had 2 echocardiagrams already and it shows that the doctor didn't take the time to read her chart or he would've known that already.  She feels we have dropped the ball in regards to her healthcare.  I apologized for her feeling as if she was not important and that I would bring her concerns to our Environmental education officer, dr Mitchel Honour and Caro Hight for review. Advised pt I would ask Dr. Carlota Raspberry to give her call re: CT results but later realized dr Carlota Raspberry didn't order study and would send to sagardia to call and discuss an answer any questions pt may have.  I did advise she will be receiving a call back from our leadership team Corene Cornea, [place]) regarding her concerns and dissatisfaction.  Pt agreeable. Dgaddy, CMA

## 2019-03-03 NOTE — Telephone Encounter (Signed)
Pt requesting CT results be sent to home address.  Mailed out today. Pt notified an appreciative. Dgaddy, CMA

## 2019-03-06 ENCOUNTER — Other Ambulatory Visit: Payer: Self-pay

## 2019-03-06 ENCOUNTER — Encounter (HOSPITAL_BASED_OUTPATIENT_CLINIC_OR_DEPARTMENT_OTHER): Payer: Self-pay | Admitting: Emergency Medicine

## 2019-03-06 DIAGNOSIS — W57XXXA Bitten or stung by nonvenomous insect and other nonvenomous arthropods, initial encounter: Secondary | ICD-10-CM | POA: Insufficient documentation

## 2019-03-06 DIAGNOSIS — Z87891 Personal history of nicotine dependence: Secondary | ICD-10-CM | POA: Insufficient documentation

## 2019-03-06 DIAGNOSIS — Y939 Activity, unspecified: Secondary | ICD-10-CM | POA: Diagnosis not present

## 2019-03-06 DIAGNOSIS — Z7982 Long term (current) use of aspirin: Secondary | ICD-10-CM | POA: Insufficient documentation

## 2019-03-06 DIAGNOSIS — Z853 Personal history of malignant neoplasm of breast: Secondary | ICD-10-CM | POA: Insufficient documentation

## 2019-03-06 DIAGNOSIS — Z888 Allergy status to other drugs, medicaments and biological substances status: Secondary | ICD-10-CM | POA: Insufficient documentation

## 2019-03-06 DIAGNOSIS — S40861A Insect bite (nonvenomous) of right upper arm, initial encounter: Secondary | ICD-10-CM | POA: Insufficient documentation

## 2019-03-06 DIAGNOSIS — Z79899 Other long term (current) drug therapy: Secondary | ICD-10-CM | POA: Insufficient documentation

## 2019-03-06 DIAGNOSIS — Y929 Unspecified place or not applicable: Secondary | ICD-10-CM | POA: Insufficient documentation

## 2019-03-06 DIAGNOSIS — Z885 Allergy status to narcotic agent status: Secondary | ICD-10-CM | POA: Insufficient documentation

## 2019-03-06 DIAGNOSIS — I10 Essential (primary) hypertension: Secondary | ICD-10-CM | POA: Insufficient documentation

## 2019-03-06 DIAGNOSIS — Y999 Unspecified external cause status: Secondary | ICD-10-CM | POA: Insufficient documentation

## 2019-03-06 NOTE — ED Triage Notes (Signed)
Noticed bite on right outer arm last night. Small raised, no drainage. PT very anxious about being bit. States is has gotten "bigger" since last night.

## 2019-03-07 ENCOUNTER — Encounter (HOSPITAL_BASED_OUTPATIENT_CLINIC_OR_DEPARTMENT_OTHER): Payer: Self-pay | Admitting: Emergency Medicine

## 2019-03-07 ENCOUNTER — Emergency Department (HOSPITAL_BASED_OUTPATIENT_CLINIC_OR_DEPARTMENT_OTHER)
Admission: EM | Admit: 2019-03-07 | Discharge: 2019-03-07 | Disposition: A | Discharge status: Home / Self Care | Payer: Medicare Other | Attending: Emergency Medicine | Admitting: Emergency Medicine

## 2019-03-07 DIAGNOSIS — S40861A Insect bite (nonvenomous) of right upper arm, initial encounter: Secondary | ICD-10-CM

## 2019-03-07 DIAGNOSIS — W57XXXA Bitten or stung by nonvenomous insect and other nonvenomous arthropods, initial encounter: Secondary | ICD-10-CM

## 2019-03-07 MED ORDER — CEPHALEXIN 250 MG PO CAPS
500.0000 mg | ORAL_CAPSULE | Freq: Once | ORAL | Status: AC
  Administered 2019-03-07: 500 mg via ORAL
  Filled 2019-03-07: qty 2

## 2019-03-07 MED ORDER — BACITRACIN ZINC 500 UNIT/GM EX OINT
TOPICAL_OINTMENT | Freq: Two times a day (BID) | CUTANEOUS | Status: DC
  Administered 2019-03-07: 01:00:00 via TOPICAL
  Filled 2019-03-07: qty 28.35

## 2019-03-07 MED ORDER — CEPHALEXIN 500 MG PO CAPS: 500.0000 mg | ORAL_CAPSULE | Freq: Four times a day (QID) | ORAL | 0 refills | Status: DC

## 2019-03-07 NOTE — ED Provider Notes (Signed)
[facility] Provider Note   CSN: [id number] Arrival date & time: 03/06/19  2329     History   Chief Complaint Chief Complaint  Patient presents with  . Insect Bite    HPI [facility] is a 63 y.o. female.     The history is provided by the patient.  Animal Bite Contact animal:  Insect Location:  Shoulder/arm Shoulder/arm injury location:  R upper arm Time since incident:  1 day Pain details:    Quality:  Aching and itching   Severity:  Mild   Timing:  Constant   Progression:  Unchanged Incident location:  Outside Animal's rabies vaccination status:  Never received Animal in possession: no   Tetanus status:  Up to date Relieved by:  Nothing Worsened by:  Nothing Ineffective treatments:  None tried Associated symptoms: no fever, no numbness and no rash   The patient has not taken anything for the bite but is concerned as it is still red and she has no lymph nodes on that side.  No f/c/r.  No warmth.  No fluctuance. No edema.    Past Medical History:  Diagnosis Date  . Anxiety   . Arthritis    low back and hip pain intermittent  . Breast cancer ([place]) 06/02/07   r breast -surgery ,radiaology. chemotherapy  . Carpal tunnel syndrome    right hand  . Colon polyps   . Complication of anesthesia    Fentanyl, Versed-makes extra hyper, bradycardia x 1 episode in past  . Depression   . Dysplasia of vulva   . Hypertension   . S/P breast lumpectomy 07/04/07   R breast  . S/P radiation therapy 2009    Patient Active Problem List   Diagnosis Date Noted  . CRPS (complex regional pain syndrome), upper limb 10/19/2012  . History of DVT (deep vein thrombosis) 10/05/2011  . Hypertension 10/04/2011  . Depression 10/04/2011  . Personal history of vulvar dysplasia 08/15/2011  . 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 APPENDECTOMY     age 23  . CESAREAN SECTION      x 2  . COLONOSCOPY W/ POLYPECTOMY    . COLONOSCOPY WITH PROPOFOL N/A 04/27/2016   Procedure: COLONOSCOPY WITH PROPOFOL;  Surgeon: Garlan Fair, MD;  Location: [facility];  Service: Endoscopy;  Laterality: N/A;  . DILATION AND CURETTAGE OF UTERUS     multiple  . INTRAUTERINE DEVICE INSERTION    . IUD REMOVAL    . MASTECTOMY PARTIAL / LUMPECTOMY W/ AXILLARY LYMPHADENECTOMY    . TUBAL LIGATION    . VULVA SURGERY     Multiple times for dysplasia     OB History   No obstetric history on file.      Home Medications    Prior to Admission medications   Medication Sig Start Date End Date Taking? Authorizing Provider  alprazolam Duanne Moron) 2 MG tablet TAKE 1/2 TO 1 (ONE-HALF TO ONE) TABLET BY MOUTH THREE TIMES DAILY AS NEEDED FOR ANXIETY 02/10/19   [name], MD  aspirin 81 MG chewable tablet Chew 81 mg by mouth daily.    [provider]  Calcium Carbonate-Vitamin D (CALCIUM + D PO) Take 600 mg by mouth daily.      [provider]  cephALEXin (KEFLEX) 500 MG capsule Take 1 capsule (500 mg total) by mouth 4 (four) times daily. 03/07/19   Darby Shadwick, MD  cyclobenzaprine (FLEXERIL) 5 MG tablet Take 1-2 tablets (5-10 mg total) by mouth 3 (three) times daily as needed for muscle spasms. 10/15/17   [name], MD  ibuprofen (ADVIL) 600 MG tablet Take 200 mg by mouth every 6 (six) hours as needed.     [provider]  losartan-hydrochlorothiazide Konrad Penta) 50-12.5 MG tablet Take 1 tablet by mouth every morning. 11/11/18   Adrian Prows, MD  oxyCODONE-acetaminophen (PERCOCET) 10-325 MG tablet Take 1 tablet by mouth daily as needed for pain. To take one tablet once or twice daily as needed for back and hip pain. 05/24/18   [name], MD    Family History Family History  Problem Relation Age of Onset  . Colon cancer Mother 17  . Heart disease Father        heart attack  . Colon polyps Brother        one brother had large colon polyp that needed  surgery to be removed  . Heart disease Paternal Grandfather     Social History Social History   Tobacco Use  . Smoking status: Former Smoker    Packs/day: 0.75    Years: 20.00    Pack years: 15.00    Types: Cigarettes    Quit date: 05/06/1997    Years since quitting: 21.8  . Smokeless tobacco: Never Used  Substance Use Topics  . Alcohol use: Yes    Alcohol/week: 0.0 standard drinks    Comment: occ  . Drug use: No     Allergies   Prednisone, Fentanyl, Midazolam, and Vicodin [hydrocodone-acetaminophen]   Review of Systems Review of Systems  Constitutional: Negative for fever.  HENT: Negative for congestion.   Eyes: Negative for visual disturbance.  Respiratory: Negative for shortness of breath.   Cardiovascular: Negative for chest pain.  Gastrointestinal: Negative for abdominal distention.  Genitourinary: Negative for difficulty urinating.  Musculoskeletal: Negative for arthralgias and joint swelling.  Skin: Negative for pallor, rash and wound.  Neurological: Negative for numbness.  Psychiatric/Behavioral: Negative for agitation.  All other systems reviewed and are negative.    Physical Exam Updated Vital Signs BP 132/78 (BP Location: Right Arm)   Pulse 84   Temp 98.2 F (36.8 C) (Oral)   Resp 20   Ht 5\' 9"  (1.753 m)   Wt 90.7 kg   SpO2 99%   BMI 29.53 kg/m   Physical Exam Vitals signs and nursing note reviewed.  Constitutional:      Appearance: She is normal weight.  HENT:     Head: Normocephalic and atraumatic.     Nose: Nose normal.  Eyes:     Conjunctiva/sclera: Conjunctivae normal.     Pupils: Pupils are equal, round, and reactive to light.  Neck:     Musculoskeletal: Normal range of motion and neck supple. No neck rigidity.  Cardiovascular:     Rate and Rhythm: Normal rate and regular rhythm.     Pulses: Normal pulses.     Heart sounds: Normal heart sounds.  Pulmonary:     Effort: Pulmonary effort is normal.     Breath sounds: Normal  breath sounds.  Abdominal:     General: Abdomen is flat. Bowel sounds are normal.  Skin:    General: Skin is warm and dry.       Neurological:     Mental Status: She is alert.  Psychiatric:        Mood and Affect: Mood is anxious.      ED Treatments /  Results  Labs (all labs ordered are listed, but only abnormal results are displayed) Labs Reviewed - No data to display  EKG None  Radiology No results found.  Procedures Procedures (including critical care time)  Medications Ordered in ED Medications  cephALEXin (KEFLEX) capsule 500 mg (500 mg Oral Given 03/07/19 0111)     Wound care provided in the ED.  With instructions given.  No signs of infection.  Take benadryl every six hours.    Roselin Cancellieri was evaluated in [facility] on 03/07/2019 for the symptoms described in the history of present illness. She was evaluated in the context of the global COVID-19 pandemic, which necessitated consideration that the patient might be at risk for infection with the SARS-CoV-2 virus that causes COVID-19. Institutional protocols and algorithms that pertain to the evaluation of patients at risk for COVID-19 are in a state of rapid change based on information released by regulatory bodies including the CDC and federal and state organizations. These policies and algorithms were followed during the patient's care in the ED.   Final Clinical Impressions(s) / ED Diagnoses   Final diagnoses:  Insect bite of right upper arm, initial encounter   Return for intractable cough, coughing up blood,fevers >100.4 unrelieved by medication, shortness of breath, intractable vomiting, chest pain, shortness of breath, weakness,numbness, changes in speech, facial asymmetry,abdominal pain, passing out,Inability to tolerate liquids or food, cough, altered mental status or any concerns. No signs of systemic illness or infection. The patient is nontoxic-appearing on exam and vital signs  are within normal limits.   I have reviewed the triage vital signs and the nursing notes. Pertinent labs &imaging results that were available during my care of the patient were reviewed by me and considered in my medical decision making (see chart for details).After history, exam, and medical workup I feel the patient has beenappropriately medically screened and is safe for discharge home. Pertinent diagnoses were discussed with the patient. Patient was given return precautions.   ED Discharge Orders         Ordered    cephALEXin (KEFLEX) 500 MG capsule  4 times daily     03/07/19 0104           Kassem Kibbe, MD 03/07/19 [id number]

## 2019-03-07 NOTE — ED Notes (Signed)
ED Provider at bedside. 

## 2019-03-07 NOTE — Telephone Encounter (Signed)
See 02/24/2019 note on this pt.   I forwarded this telephone note to Gwyneth Revels and Sagardia to handle the issues with this pt.  Please speak with Dr. Carlota Raspberry (pcp) to get background on this pt. Dgaddy, CMA

## 2019-03-07 NOTE — Telephone Encounter (Signed)
Thank you.  I read your note.  I responded to her through my chart with her CT results.  I am however not her PCP and do not intend to be.  Thanks again.

## 2019-03-07 NOTE — ED Notes (Signed)
Pt. Reports she has had breast cancer and is worried that the poss. Insect bite may cause cellulitis in the tissue and she will have problems.  Pt. In no distress with no streaking around the wound.

## 2019-03-09 DIAGNOSIS — C519 Malignant neoplasm of vulva, unspecified: Secondary | ICD-10-CM | POA: Diagnosis not present

## 2019-03-09 DIAGNOSIS — B3749 Other urogenital candidiasis: Secondary | ICD-10-CM | POA: Diagnosis not present

## 2019-03-09 DIAGNOSIS — R87611 Atypical squamous cells cannot exclude high grade squamous intraepithelial lesion on cytologic smear of cervix (ASC-H): Secondary | ICD-10-CM | POA: Diagnosis not present

## 2019-03-09 DIAGNOSIS — Z124 Encounter for screening for malignant neoplasm of cervix: Secondary | ICD-10-CM | POA: Diagnosis not present

## 2019-03-09 DIAGNOSIS — N870 Mild cervical dysplasia: Secondary | ICD-10-CM | POA: Diagnosis not present

## 2019-03-09 DIAGNOSIS — R87612 Low grade squamous intraepithelial lesion on cytologic smear of cervix (LGSIL): Secondary | ICD-10-CM | POA: Diagnosis not present

## 2019-03-09 DIAGNOSIS — Z8544 Personal history of malignant neoplasm of other female genital organs: Secondary | ICD-10-CM | POA: Diagnosis not present

## 2019-03-09 DIAGNOSIS — Z08 Encounter for follow-up examination after completed treatment for malignant neoplasm: Secondary | ICD-10-CM | POA: Diagnosis not present

## 2019-03-15 ENCOUNTER — Ambulatory Visit (INDEPENDENT_AMBULATORY_CARE_PROVIDER_SITE_OTHER): Payer: Medicare Other | Admitting: Cardiology

## 2019-03-15 ENCOUNTER — Telehealth: Payer: Self-pay

## 2019-03-15 ENCOUNTER — Encounter: Payer: Self-pay | Admitting: Cardiology

## 2019-03-15 ENCOUNTER — Other Ambulatory Visit: Payer: Self-pay

## 2019-03-15 VITALS — BP 136/80 | HR 96 | Temp 98.0°F | Ht 69.0 in | Wt 204.2 lb

## 2019-03-15 DIAGNOSIS — I10 Essential (primary) hypertension: Secondary | ICD-10-CM

## 2019-03-15 DIAGNOSIS — I6521 Occlusion and stenosis of right carotid artery: Secondary | ICD-10-CM | POA: Diagnosis not present

## 2019-03-15 DIAGNOSIS — I2510 Atherosclerotic heart disease of native coronary artery without angina pectoris: Secondary | ICD-10-CM | POA: Diagnosis not present

## 2019-03-15 DIAGNOSIS — E782 Mixed hyperlipidemia: Secondary | ICD-10-CM | POA: Diagnosis not present

## 2019-03-15 MED ORDER — ATORVASTATIN CALCIUM 10 MG PO TABS: 10.0000 mg | ORAL_TABLET | Freq: Every day | ORAL | 2 refills | Status: DC

## 2019-03-15 NOTE — Telephone Encounter (Signed)
Call from pt who is very concerned with statements made in CT reading.  Result documentation refers to h/o fatty liver and mastectomy.  Pt states she doesn't know where we came up with this info or why we would say it.  Advised that this documentation was not from our office but from radiology reading.  Reviewed chart, previous imaging did have results consistent with fatty liver.  No indication of h/o mastectomy.  Provided pt with steps to take per [facility] policy to request amendment of personal health information.  Pt has no further questions at this time.

## 2019-03-15 NOTE — Progress Notes (Signed)
Virtual Visit via Telephone Note: Patient unable to use video assisted device.  This visit type was conducted due to national recommendations for restrictions regarding the COVID-19 Pandemic (e.g. social distancing).  This format is felt to be most appropriate for this patient at this time.  All issues noted in this document were discussed and addressed.  No physical exam was performed.  The patient has consented to conduct a Telehealth visit and understands insurance will be billed.   [name], on 03/15/19 at  by TELEPHONE and verified that I am speaking with the correct person using two identifiers.   I discussed the limitations of evaluation and management by telemedicine and the availability of in person appointments. The patient expressed understanding and agreed to proceed.   I have discussed with patient regarding the safety during COVID Pandemic and steps and precautions to be taken including social distancing, frequent hand wash and use of detergent soap, gels with the patient. I asked the patient to avoid touching mouth, nose, eyes, ears with the hands. I encouraged regular walking around the neighborhood and exercise and regular diet, as long as social distancing can be maintained.  Primary Physician/Referring:  [name], MD  Patient ID: [facility], female    DOB: Mar 10, 1956, 63 y.o.   MRN: [id number]  Chief Complaint  Patient presents with  . Coronary Artery Disease  . Hypertension  . Follow-up    HPI: [facility]  is a 63 y.o. female  with history of PSVT when she presented on 08/04/2016 to the emergency room and terminated with adenosine, anxiety and depression, history of breast cancer status post radiation therapy and chemotherapy in 2009, hypertension, mild aortic valve stenosis, hyperlipidemia, presents here for follow-up of palpitations.  Patient was seen in the emergency room on 01/18/18 with sinus tachycardia and atrial tachycardia, 30  day event monitor revealed frequent PACs and also atrial bigeminy and rare PVCs which were symptomatic with palpitations. She also had brief episodes of atrial tachycardia. One episode of NSVT. Hence, the nuclear stress test was performed which was non-ischemic. Echocardiogram does reveal mild aortic stenosis. She now presents for 6 month follow up. No new symptoms Except for marked anxiety over Covid-19. States that palpitation symptoms have remained stable.  No change in her dyspnea.  Past Medical History:  Diagnosis Date  . Anxiety   . Arthritis    low back and hip pain intermittent  . Breast cancer ([place]) 06/02/07   r breast -surgery ,radiaology. chemotherapy  . Carpal tunnel syndrome    right hand  . Colon polyps   . Complication of anesthesia    Fentanyl, Versed-makes extra hyper, bradycardia x 1 episode in past  . Depression   . Dysplasia of vulva   . Hypertension   . S/P breast lumpectomy 07/04/07   R breast  . S/P radiation therapy 2009    Past Surgical History:  Procedure Laterality Date  . APPENDECTOMY     age 41  . CESAREAN SECTION     x 2  . COLONOSCOPY W/ POLYPECTOMY    . COLONOSCOPY WITH PROPOFOL N/A 04/27/2016   Procedure: COLONOSCOPY WITH PROPOFOL;  Surgeon: Garlan Fair, MD;  Location: [facility];  Service: Endoscopy;  Laterality: N/A;  . DILATION AND CURETTAGE OF UTERUS     multiple  . INTRAUTERINE DEVICE INSERTION    . IUD REMOVAL    . MASTECTOMY PARTIAL / LUMPECTOMY W/ AXILLARY LYMPHADENECTOMY    . TUBAL LIGATION    .  VULVA SURGERY     Multiple times for dysplasia    Social History   Socioeconomic History  . Marital status: Widowed    Spouse name: Not on file  . Number of children: 2  . Years of education: Not on file  . Highest education level: Not on file  Occupational History  . Occupation: Metallurgist: [employer]  . Financial resource strain: Not on file  . Food insecurity    Worry: Not on file     Inability: Not on file  . Transportation needs    Medical: Not on file    Non-medical: Not on file  Tobacco Use  . Smoking status: Former Smoker    Packs/day: 0.75    Years: 20.00    Pack years: 15.00    Types: Cigarettes    Quit date: 05/06/1997    Years since quitting: 21.8  . Smokeless tobacco: Never Used  Substance and Sexual Activity  . Alcohol use: Yes    Alcohol/week: 0.0 standard drinks    Comment: occ  . Drug use: No  . Sexual activity: Never    Birth control/protection: Abstinence  Lifestyle  . Physical activity    Days per week: Not on file    Minutes per session: Not on file  . Stress: Not on file  Relationships  . Social Herbalist on phone: Not on file    Gets together: Not on file    Attends religious service: Not on file    Active member of club or organization: Not on file    Attends meetings of clubs or organizations: Not on file    Relationship status: Not on file  . Intimate partner violence    Fear of current or ex partner: Not on file    Emotionally abused: Not on file    Physically abused: Not on file    Forced sexual activity: Not on file  Other Topics Concern  . Not on file  Social History Narrative  . Not on file    Current Outpatient Medications on File Prior to Visit  Medication Sig Dispense Refill  . alprazolam (XANAX) 2 MG tablet TAKE 1/2 TO 1 (ONE-HALF TO ONE) TABLET BY MOUTH THREE TIMES DAILY AS NEEDED FOR ANXIETY 90 tablet 0  . aspirin 81 MG chewable tablet Chew 81 mg by mouth daily.    Marland Kitchen ibuprofen (ADVIL) 600 MG tablet Take 200 mg by mouth every 6 (six) hours as needed.     Marland Kitchen losartan-hydrochlorothiazide (HYZAAR) 50-12.5 MG tablet Take 1 tablet by mouth every morning. 90 tablet 2  . Calcium Carbonate-Vitamin D (CALCIUM + D PO) Take 600 mg by mouth daily.      . cyclobenzaprine (FLEXERIL) 5 MG tablet Take 1-2 tablets (5-10 mg total) by mouth 3 (three) times daily as needed for muscle spasms. (Patient not taking: Reported on  03/15/2019) 30 tablet 0  . oxyCODONE-acetaminophen (PERCOCET) 10-325 MG tablet Take 1 tablet by mouth daily as needed for pain. To take one tablet once or twice daily as needed for back and hip pain. (Patient not taking: Reported on 03/15/2019) 30 tablet 0   No current facility-administered medications on file prior to visit.    Review of Systems  Constitution: Negative for decreased appetite, malaise/fatigue, weight gain and weight loss.  Eyes: Negative for visual disturbance.  Cardiovascular: Positive for dyspnea on exertion and palpitations (stable). Negative for chest pain, claudication, leg swelling, orthopnea and  syncope.  Respiratory: Negative for hemoptysis and wheezing.   Endocrine: Negative for cold intolerance and heat intolerance.  Hematologic/Lymphatic: Does not bruise/bleed easily.  Skin: Negative for nail changes.  Musculoskeletal: Negative for muscle weakness and myalgias.  Gastrointestinal: Negative for abdominal pain, change in bowel habit, nausea and vomiting.  Neurological: Negative for difficulty with concentration, dizziness, focal weakness and headaches.  Psychiatric/Behavioral: Positive for depression. Negative for altered mental status and suicidal ideas. The patient is nervous/anxious.   All other systems reviewed and are negative.     Objective  Blood pressure (!) 159/95, pulse 96, temperature 98 F (36.7 C), height 5\' 9"  (1.753 m), weight 204 lb 3.2 oz (92.6 kg), SpO2 96 %. Body mass index is 30.16 kg/m.    Physical exam not performed or limited due to virtual visit. Please see exam details from prior visit is as below. Physical Exam  Constitutional: She is oriented to person, place, and time. Vital signs are normal. She appears well-developed and well-nourished.  Mildly obese  HENT:  Head: Normocephalic and atraumatic.  Neck: Normal range of motion.  Cardiovascular: Normal rate, regular rhythm and intact distal pulses.  Murmur heard.  Midsystolic murmur is  present with a grade of 2/6 at the upper right sternal border. Pulses:      Carotid pulses are on the right side with bruit and on the left side with bruit. Pulmonary/Chest: Effort normal and breath sounds normal. No accessory muscle usage. No respiratory distress.  Abdominal: Soft. Bowel sounds are normal.  Musculoskeletal: Normal range of motion.  Neurological: She is alert and oriented to person, place, and time.  Skin: Skin is warm and dry.  Vitals reviewed.  Laboratory examination:   CMP Latest Ref Rng & Units 01/11/2019 01/18/2018 10/15/2017  Glucose 70 - 99 mg/dL 106(H) 115(H) 95  BUN 8 - 23 mg/dL 12 11 11   Creatinine 0.44 - 1.00 mg/dL 0.72 0.70 0.65  Sodium 135 - 145 mmol/L 137 133(L) 140  Potassium 3.5 - 5.1 mmol/L 3.9 3.5 4.3  Chloride 98 - 111 mmol/L 98 95(L) 100  CO2 22 - 32 mmol/L 27 27 24   Calcium 8.9 - 10.3 mg/dL 9.5 9.2 9.7  Total Protein 6.5 - 8.1 g/dL 7.2 - 6.7  Total Bilirubin 0.3 - 1.2 mg/dL 0.7 - 0.5  Alkaline Phos 38 - 126 U/L 62 - 67  AST 15 - 41 U/L 18 - 17  ALT 0 - 44 U/L 17 - 15   CBC Latest Ref Rng & Units 01/11/2019 01/18/2018 08/25/2016  WBC 4.0 - 10.5 K/uL 8.9 13.4(H) 12.3(A)  Hemoglobin 12.0 - 15.0 g/dL 15.5(H) 16.6(H) 14.9  Hematocrit 36.0 - 46.0 % 46.4(H) 47.1(H) 41.5  Platelets 150 - 400 K/uL 396 438(H) -   Lipid Panel     Component Value Date/Time   CHOL 255 (H) 10/15/2017 1624   TRIG 167 (H) 10/15/2017 1624   HDL 44 10/15/2017 1624   CHOLHDL 5.8 (H) 10/15/2017 1624   CHOLHDL 6.7 12/17/2011 0949   VLDL 48 (H) 12/17/2011 0949   LDLCALC 178 (H) 10/15/2017 1624   HEMOGLOBIN A1C Lab Results  Component Value Date   HGBA1C 5.8 (H) 10/15/2017   MPG 114 10/06/2011   TSH No results for input(s): TSH in the last 8760 hours. Cardiac Studies:   Exercise sestamibi stress test 04/04/2018: 1. The resting electrocardiogram demonstrated normal sinus rhythm, LAD, LAFB, incomplete RBBB, poor R progression and no resting arrhythmias. The stress  electrocardiogram was normal. Patient exercised on Bruce protocol  for 4:55 minutes and achieved 6.95 METS. Stress test terminated due to chest pressure and 87% MPHR achieved (Target HR >85%). Resting BP 150/88 and pea BP 218/102 mm hg. 2. Myocardial perfusion imaging is normal. Overall left ventricular systolic function was normal without regional wall motion abnormalities. The left ventricular ejection fraction was 72%. This is a low risk study. Chest pain could be related to hypertensive heart disease. Clinical correlation recommended.  Event monitor 30 days 03/11/2018: Episodic transmission 03/20/2018 1:30 pm: 5 beat NSVT.One brief atrial tach @ 142/min. Frequent PAC in bigeminal pattern, symptomatic. Rare PVC.  Echocardiogram 03/08/2018: Left ventricle cavity is normal in size. Moderate concentric hypertrophy of the left ventricle. Normal global wall motion. Doppler evidence of grade I (impaired) diastolic dysfunction, elevated LAP. Calculated EF 60%. Mild aortic valve leaflet calcification. Severely restricted aortic valve leaflets. Mild aortic valve stenosis. Trace aortic regurgitation. Aortic valve peak pressure gradient of 23 and mean gradient of 13 mmHg, calculated aortic valve area 1.59 cm. In 2-D, the valve appears to be severely restricted. Compared to the echocardiogram 09/09/2016, mean gradient has increased from 13 mmHg. Otherwise no significant change. Consider TEE if clinically indicated.  Carotid artery duplex  11/04/2018: Stenosis in the right internal carotid artery (50-69%). Antegrade right vertebral artery flow. Antegrade left vertebral artery flow. Compared to the study done on 05/17/2018, no significant change (prior record [employer]). Follow up in six months is appropriate if clinically indicated.  Assessment   Coronary artery calcification seen on CAT scan - 02/20/2019 Atherosclerotic calcifications in the descending thoracic aorta aswell as the left anterior descending and  right coronary arteries.  Asymptomatic stenosis of right carotid artery  Mixed hyperlipidemia  Essential hypertension  EKG 01/26/2018: Normal sinus rhythm at 89 bpm with first-degree AV block and borderline right atrial enlargement. Normal interval, no evidence of ischemia. No significant change compared to EKG March 2018.  Recommendations:   I had a 30 minute telephone face to face (telephone encounter), regarding hyperlipidemia, hypertension and carotid stenosis. She is very resistant to adding any medications. Is opposed to starting any medications. She is willing to take Losartan HCT daily. Patient instructed to start ASA  81mg  q daily for prophylaxis. Additional 10 minute in documentation and review of labs.  Otherwise no new symptoms, no change in dyspnea. Palpitations are stable.  Schedule for carotid duplex for bruit/follow-up surveillance of carotid stenosis. Diet modification and use of natural medications like red yeast rice, flax seed oil discussed as well. Lipids in 4-6 weeks. Office visit following the work-up/investigations.   Adrian Prows, MD, [facility] 03/15/2019, 3:56 PM [facility] [place] Pager: [phone] Office: [phone] If no answer Cell [phone]

## 2019-03-15 NOTE — Addendum Note (Signed)
Addended by: Kela Millin on: 03/15/2019 04:05 PM   Modules accepted: Orders

## 2019-03-20 ENCOUNTER — Other Ambulatory Visit: Payer: Self-pay | Admitting: Family Medicine

## 2019-03-20 DIAGNOSIS — F411 Generalized anxiety disorder: Secondary | ICD-10-CM

## 2019-03-20 NOTE — Telephone Encounter (Signed)
Patient is requesting a refill of the following medications: Requested Prescriptions   Pending Prescriptions Disp Refills  . alprazolam (XANAX) 2 MG tablet [Pharmacy Med Name: ALPRAZolam 2 MG Oral Tablet] 90 tablet 0    Sig: TAKE 1/2 TO 1 (ONE-HALF TO ONE) TABLET BY MOUTH THREE TIMES DAILY AS NEEDED FOR ANXIETY    Date of patient request: 03/20/19 Last office visit:  Date of last refill: 02/10/19 Last refill amount: 90 Follow up time period per chart: none

## 2019-03-21 NOTE — Telephone Encounter (Signed)
See my notes from 8/21.  I do not see follow up response for those questions.   As I do not see where we gathered that information, will refill additional time but due to time that has elapsed since our last visit - will need to either see psychiatry for next refill, or can meet with me in office for plan going forward if not followed by psychiatry.  I have reviewed other notes and not sure if she has planned to arrange different primary care provider.  Will forward to clinical manager to assist further.   Controlled substance database (PDMP) reviewed.last rx alprazolam filled 02/10/19. # 90.  Previous on 12/29/18. Last oxycodone Rx #30 on 07/29/18.   Refill placed for alprazolam.   Thanks.

## 2019-03-23 ENCOUNTER — Other Ambulatory Visit: Payer: Self-pay

## 2019-03-23 DIAGNOSIS — F411 Generalized anxiety disorder: Secondary | ICD-10-CM

## 2019-03-23 NOTE — Telephone Encounter (Signed)
Call to pt.  Advised of recent refill and discussed need for either OV or to f/u with psych for ongoing tx of anxiety.  Pt states she will need to see psych as she is not wanting to d/c that medication at this time.  She has had trouble finding psych office that accepts her [facility]. New referral entered.

## 2019-03-28 DIAGNOSIS — Z853 Personal history of malignant neoplasm of breast: Secondary | ICD-10-CM | POA: Diagnosis not present

## 2019-03-28 DIAGNOSIS — Z1231 Encounter for screening mammogram for malignant neoplasm of breast: Secondary | ICD-10-CM | POA: Diagnosis not present

## 2019-03-28 LAB — HM MAMMOGRAPHY

## 2019-04-03 ENCOUNTER — Encounter: Payer: Self-pay | Admitting: Family Medicine

## 2019-05-02 ENCOUNTER — Other Ambulatory Visit: Payer: Self-pay | Admitting: Family Medicine

## 2019-05-02 DIAGNOSIS — F411 Generalized anxiety disorder: Secondary | ICD-10-CM

## 2019-05-02 NOTE — Telephone Encounter (Signed)
Requested medication (s) are due for refill today: yes  Requested medication (s) are on the active medication list: yes   Last refill: 03/21/2019  #90 0 refills  Future visit scheduled no  Notes to clinic:not delegated  Requested Prescriptions  Pending Prescriptions Disp Refills   alprazolam (XANAX) 2 MG tablet [Pharmacy Med Name: ALPRAZolam 2 MG Oral Tablet] 90 tablet 0    Sig: TAKE 1/2 TO 1 (ONE-HALF TO ONE) TABLET BY MOUTH THREE TIMES DAILY AS NEEDED FOR ANXIETY     Not Delegated - Psychiatry:  Anxiolytics/Hypnotics Failed - 05/02/2019  5:19 PM      Failed - This refill cannot be delegated      Failed - Urine Drug Screen completed in last 360 days.      Passed - Valid encounter within last 6 months    Recent Outpatient Visits          2 months ago Flank pain   [facility] at [facility], [place], [place]   11 months ago GAD (generalized anxiety disorder)   [facility] at [facility], Ranell Patrick, MD   1 year ago Essential hypertension   [facility] at [facility], Ranell Patrick, MD   2 years ago Essential hypertension   [facility] at [facility], Ranell Patrick, MD   2 years ago SVT (supraventricular tachycardia) [facility])   [facility] at [facility], Ranell Patrick, MD      Future Appointments            In 2 months Adrian Prows, MD [facility]   In 4 months Adrian Prows, MD [facility]

## 2019-05-08 ENCOUNTER — Telehealth: Payer: Self-pay | Admitting: Family Medicine

## 2019-05-08 NOTE — Telephone Encounter (Signed)
Medication: Patient is calling to ask if Dr. Carlota Raspberry could refill her medication - she is trying to find a doctor - alprazolam Duanne Moron) 2 MG tablet [id number]   Has the patient contacted their pharmacy? Yes  (Agent: If no, request that the patient contact the pharmacy for the refill.) (Agent: If yes, when and what did the pharmacy advise?)  Preferred Pharmacy (with phone number or street name): [facility] [phone]  Agent: Please be advised that RX refills may take up to 3 business days. We ask that you follow-up with your pharmacy.

## 2019-05-09 ENCOUNTER — Other Ambulatory Visit: Payer: Self-pay

## 2019-05-09 ENCOUNTER — Ambulatory Visit (INDEPENDENT_AMBULATORY_CARE_PROVIDER_SITE_OTHER): Payer: Medicare Other

## 2019-05-09 DIAGNOSIS — R0989 Other specified symptoms and signs involving the circulatory and respiratory systems: Secondary | ICD-10-CM | POA: Diagnosis not present

## 2019-05-09 NOTE — Telephone Encounter (Signed)
See prior notes for refills.  Who is she scheduled to see for psychiatry? I see referral sent to [facility] on 03/27/19.  I have been refilling xanax since her last visit with me in 05/2018 in good faith during pandemic and in anticipation of psychiatry follow up which has not happened. No further refills without psychiatry eval.  If unable to see psychiatry I need to know why and circumstances. If OV needed to discuss plan, can schedule if needed. Let me know if there are questions.  Controlled substance database (PDMP) reviewed. Last filled 9/30.  One refill granted with plan above.

## 2019-05-15 ENCOUNTER — Telehealth: Payer: Self-pay

## 2019-05-15 DIAGNOSIS — I2510 Atherosclerotic heart disease of native coronary artery without angina pectoris: Secondary | ICD-10-CM

## 2019-05-15 DIAGNOSIS — I6521 Occlusion and stenosis of right carotid artery: Secondary | ICD-10-CM

## 2019-05-15 DIAGNOSIS — E782 Mixed hyperlipidemia: Secondary | ICD-10-CM

## 2019-05-15 NOTE — Telephone Encounter (Signed)
I will discuss with her

## 2019-05-15 NOTE — Telephone Encounter (Signed)
Pt called regarding carotid dup results. Advised that stenosis severity has increased from less than 70% compared to study done in May. What else should I advise her of.//ah

## 2019-05-15 NOTE — Telephone Encounter (Signed)
Looks like she will need vascular surgery consult. Please send to Dr. Einar Gip and confirm

## 2019-05-15 NOTE — Telephone Encounter (Signed)
Please review and advise.//ah

## 2019-05-22 ENCOUNTER — Telehealth: Payer: Self-pay

## 2019-05-22 NOTE — Telephone Encounter (Signed)
ICD-10-CM   1. Coronary artery calcification seen on CAT scan  I25.10   2. Asymptomatic stenosis of right carotid artery  I65.21   3. Mixed hyperlipidemia  E78.2     Carotid artery duplex  05/09/2019: Critical stenosis in the right internal carotid artery (80-99%). The right PSV internal/common carotid artery ratio of 5.19 is consistent with a stenosis of >70%. Peak velocity 321/160 cm/s. Minimal stenosis in the left internal carotid artery (minimal). Antegrade right vertebral artery flow. Antegrade left vertebral artery flow. Compared to the study done on 11/04/2018, right ICA stenosis stenosis severity has increased from less than 70%.  See images. Consider vascular surgical consultation.   Rec: I reviewed the results of the carotid artery duplex, patient as usual became extremely anxious and asking me what is the next step, advised her that she has severe hyperlipidemia, coronary calcification, hypertension, that she needs to be on a statin and although I had given her a prescription for Lipitor she has not restarted this.  She'll probably need 80 mg of Lipitor but I am trying to coax her to taking at least 10 mg.  She is worried about the side effects and this has been previously explained to her in detail.  After she is on statins, she needs to repeat lipid profile testing in 4 weeks.  I will refer her to be evaluated by vascular surgery.  I'll like to see her in the office in 2 months to improve compliance.

## 2019-05-22 NOTE — Telephone Encounter (Signed)
Pt called to inform us that she was scared of taking her Lipitor. Adviser it was up to her if she wants to taking it. Pt undertstood

## 2019-05-24 ENCOUNTER — Telehealth: Payer: Self-pay

## 2019-05-31 DIAGNOSIS — D485 Neoplasm of uncertain behavior of skin: Secondary | ICD-10-CM | POA: Diagnosis not present

## 2019-05-31 DIAGNOSIS — L821 Other seborrheic keratosis: Secondary | ICD-10-CM | POA: Diagnosis not present

## 2019-05-31 DIAGNOSIS — Z85828 Personal history of other malignant neoplasm of skin: Secondary | ICD-10-CM | POA: Diagnosis not present

## 2019-06-01 ENCOUNTER — Encounter: Payer: Self-pay | Admitting: *Deleted

## 2019-06-01 ENCOUNTER — Ambulatory Visit (HOSPITAL_COMMUNITY)
Admission: RE | Admit: 2019-06-01 | Discharge: 2019-06-01 | Disposition: A | Discharge status: Home / Self Care | Payer: Medicare Other | Source: Ambulatory Visit | Attending: Vascular Surgery | Admitting: Vascular Surgery

## 2019-06-01 ENCOUNTER — Other Ambulatory Visit: Payer: Self-pay

## 2019-06-01 ENCOUNTER — Other Ambulatory Visit: Payer: Self-pay | Admitting: *Deleted

## 2019-06-01 ENCOUNTER — Encounter: Payer: Self-pay | Admitting: Vascular Surgery

## 2019-06-01 ENCOUNTER — Ambulatory Visit (INDEPENDENT_AMBULATORY_CARE_PROVIDER_SITE_OTHER): Payer: Medicare Other | Admitting: Vascular Surgery

## 2019-06-01 ENCOUNTER — Encounter (HOSPITAL_COMMUNITY)

## 2019-06-01 VITALS — BP 152/99 | HR 97 | Temp 98.2°F | Resp 20 | Ht 69.0 in | Wt 198.0 lb

## 2019-06-01 DIAGNOSIS — I6529 Occlusion and stenosis of unspecified carotid artery: Secondary | ICD-10-CM

## 2019-06-01 DIAGNOSIS — I6521 Occlusion and stenosis of right carotid artery: Secondary | ICD-10-CM

## 2019-06-01 NOTE — H&P (View-Only) (Signed)
Referring Physician: Dr. Einar Gip  Patient name: Madison West MRN: [id number] DOB: 05-28-1956 Sex: female  REASON FOR CONSULT: Asymptomatic 80% right internal carotid artery stenosis  HPI: Madison West is a 63 y.o. female, with known prior mild to moderate carotid stenosis being followed by Dr. Einar Gip with surveillance.  Patient was recently noted to have progressed to greater than 80% stenosis.  She is currently on aspirin.  A statin had been recommended to her in the past but she was reluctant to take this.  She has never had any symptoms of TIA amaurosis or stroke.  Other medical problems include anxiety, hypertension, history of breast cancer all of which have been stable.  She also currently complains of intermittent right lower quadrant abdominal pain which is going to be evaluated in the near future by GI.  Past Medical History:  Diagnosis Date  . Anxiety   . Arthritis    low back and hip pain intermittent  . Breast cancer ([place]) 06/02/07   r breast -surgery ,radiaology. chemotherapy  . Carpal tunnel syndrome    right hand  . Colon polyps   . Complication of anesthesia    Fentanyl, Versed-makes extra hyper, bradycardia x 1 episode in past  . Depression   . Dysplasia of vulva   . Hypertension   . S/P breast lumpectomy 07/04/07   R breast  . S/P radiation therapy 2009   Past Surgical History:  Procedure Laterality Date  . APPENDECTOMY     age 74  . CESAREAN SECTION     x 2  . COLONOSCOPY W/ POLYPECTOMY    . COLONOSCOPY WITH PROPOFOL N/A 04/27/2016   Procedure: COLONOSCOPY WITH PROPOFOL;  Surgeon: Garlan Fair, MD;  Location: [facility];  Service: Endoscopy;  Laterality: N/A;  . DILATION AND CURETTAGE OF UTERUS     multiple  . INTRAUTERINE DEVICE INSERTION    . IUD REMOVAL    . MASTECTOMY PARTIAL / LUMPECTOMY W/ AXILLARY LYMPHADENECTOMY    . TUBAL LIGATION    . VULVA SURGERY     Multiple times for dysplasia    Family History  Problem Relation Age  of Onset  . Colon cancer Mother 27  . Heart disease Father        heart attack  . Colon polyps Brother        one brother had large colon polyp that needed surgery to be removed  . Heart disease Paternal Grandfather     SOCIAL HISTORY: Social History   Socioeconomic History  . Marital status: Widowed    Spouse name: Not on file  . Number of children: 2  . Years of education: Not on file  . Highest education level: Not on file  Occupational History  . Occupation: Metallurgist: Arboriculturist  Tobacco Use  . Smoking status: Former Smoker    Packs/day: 0.75    Years: 20.00    Pack years: 15.00    Types: Cigarettes    Quit date: 05/06/1997    Years since quitting: 22.0  . Smokeless tobacco: Never Used  Substance and Sexual Activity  . Alcohol use: Yes    Alcohol/week: 0.0 standard drinks    Comment: occ  . Drug use: No  . Sexual activity: Never    Birth control/protection: Abstinence  Other Topics Concern  . Not on file  Social History Narrative  . Not on file   Social Determinants of Health   Financial Resource Strain:   .  Difficulty of Paying Living Expenses: Not on file  Food Insecurity:   . Worried About Charity fundraiser in the Last Year: Not on file  . Ran Out of Food in the Last Year: Not on file  Transportation Needs:   . Lack of Transportation (Medical): Not on file  . Lack of Transportation (Non-Medical): Not on file  Physical Activity:   . Days of Exercise per Week: Not on file  . Minutes of Exercise per Session: Not on file  Stress:   . Feeling of Stress : Not on file  Social Connections:   . Frequency of Communication with Friends and Family: Not on file  . Frequency of Social Gatherings with Friends and Family: Not on file  . Attends Religious Services: Not on file  . Active Member of Clubs or Organizations: Not on file  . Attends Archivist Meetings: Not on file  . Marital Status: Not on file  Intimate Partner Violence:    . Fear of Current or Ex-Partner: Not on file  . Emotionally Abused: Not on file  . Physically Abused: Not on file  . Sexually Abused: Not on file    Allergies  Allergen Reactions  . Prednisone Other (See Comments)  . Fentanyl Other (See Comments)    MAKES SUPER HYPER PER PT  . Midazolam Anxiety  . Vicodin [Hydrocodone-Acetaminophen] Anxiety    Extreme anxiety.    Current Outpatient Medications  Medication Sig Dispense Refill  . alprazolam (XANAX) 2 MG tablet TAKE 1/2 TO 1 (ONE-HALF TO ONE) TABLET BY MOUTH THREE TIMES DAILY AS NEEDED FOR ANXIETY 90 tablet 0  . aspirin 81 MG chewable tablet Chew 81 mg by mouth daily.    . Calcium Carbonate-Vitamin D (CALCIUM + D PO) Take 600 mg by mouth daily.      . cyclobenzaprine (FLEXERIL) 5 MG tablet Take 1-2 tablets (5-10 mg total) by mouth 3 (three) times daily as needed for muscle spasms. 30 tablet 0  . ibuprofen (ADVIL) 600 MG tablet Take 200 mg by mouth every 6 (six) hours as needed.     Marland Kitchen losartan-hydrochlorothiazide (HYZAAR) 50-12.5 MG tablet Take 1 tablet by mouth every morning. 90 tablet 2  . oxyCODONE-acetaminophen (PERCOCET) 10-325 MG tablet Take 1 tablet by mouth daily as needed for pain. To take one tablet once or twice daily as needed for back and hip pain. 30 tablet 0  . atorvastatin (LIPITOR) 10 MG tablet Take 1 tablet (10 mg total) by mouth daily. (Patient not taking: Reported on 06/01/2019) 30 tablet 2   No current facility-administered medications for this visit.    ROS:   General:  No weight loss, Fever, chills  HEENT: No recent headaches, no nasal bleeding, no visual changes, no sore throat  Neurologic: No dizziness, blackouts, seizures. No recent symptoms of stroke or mini- stroke. No recent episodes of slurred speech, or temporary blindness.  Cardiac: No recent episodes of chest pain/pressure, no shortness of breath at rest.  No shortness of breath with exertion.  Denies history of atrial fibrillation or irregular  heartbeat  Vascular: No history of rest pain in feet.  No history of claudication.  No history of non-healing ulcer, No history of DVT   Pulmonary: No home oxygen, no productive cough, no hemoptysis,  No asthma or wheezing  Musculoskeletal:  [ ]  Arthritis, [ ]  Low back pain,  [ ]  Joint pain  Hematologic:No history of hypercoagulable state.  No history of easy bleeding.  No history of  anemia  Gastrointestinal: No hematochezia or melena,  No gastroesophageal reflux, no trouble swallowing  Urinary: [ ]  chronic Kidney disease, [ ]  on HD - [ ]  MWF or [ ]  TTHS, [ ]  Burning with urination, [ ]  Frequent urination, [ ]  Difficulty urinating;   Skin: No rashes  Psychological: No history of anxiety,  No history of depression   Physical Examination  Vitals:   06/01/19 1354  BP: (!) 152/99  Pulse: 97  Resp: 20  Temp: 98.2 F (36.8 C)  SpO2: 98%  Weight: 198 lb (89.8 kg)  Height: 5\' 9"  (1.753 m)    Body mass index is 29.24 kg/m.  General:  Alert and oriented, no acute distress HEENT: Normal Neck: No JVD Pulmonary: Clear to auscultation bilaterally Cardiac: Regular Rate and Rhythm  Abdomen: Soft, non-tender, non-distended, no mass Skin: No rash Extremity Pulses:  2+ radial, brachial, femoral, dorsalis pedis pulses bilaterally Musculoskeletal: No deformity or edema  Neurologic: Upper and lower extremity motor 5/5 and symmetric  DATA:  We repeated the patient's carotid duplex exam today which again shows greater than 80% stenosis with velocities over 400 no significant left side stenosis antegrade vertebral flow  ASSESSMENT: Succussion with the patient today possibility of right carotid endarterectomy for stroke prophylaxis.  We also discussed the possibility of TCAR stenting.  However she is not currently on a statin or Plavix so she is not really a candidate for that as long as she is refusing those medications.  However, anatomically speaking and overall health wise she looks like  a reasonable candidate for open carotid endarterectomy.  I discussed with her risk benefits and possible complications of carotid endarterectomy including not limited to bleeding infection stroke risk 1 to 2% cranial nerve injury risk 5 to 10% usually transient.  Myocardial risk of 5%.  She understands and agrees to proceed.  He is very anxious regarding the procedure.  I spent approximately 15 minutes today counseling her with her anxiety regarding the procedure.  This was in addition to the 15 to 20 minutes discussing the procedure.   PLAN: Right carotid endarterectomy scheduled for June 09, 2019.   Ruta Hinds, MD [facility] Office: [phone] Pager: [phone]

## 2019-06-01 NOTE — Progress Notes (Signed)
Referring Physician: Dr. Einar Gip  Patient name: Madison West MRN: [id number] DOB: Oct 23, 1955 Sex: female  REASON FOR CONSULT: Asymptomatic 80% right internal carotid artery stenosis  HPI: Madison West is a 63 y.o. female, with known prior mild to moderate carotid stenosis being followed by Dr. Einar Gip with surveillance.  Patient was recently noted to have progressed to greater than 80% stenosis.  She is currently on aspirin.  A statin had been recommended to her in the past but she was reluctant to take this.  She has never had any symptoms of TIA amaurosis or stroke.  Other medical problems include anxiety, hypertension, history of breast cancer all of which have been stable.  She also currently complains of intermittent right lower quadrant abdominal pain which is going to be evaluated in the near future by GI.  Past Medical History:  Diagnosis Date  . Anxiety   . Arthritis    low back and hip pain intermittent  . Breast cancer ([place]) 06/02/07   r breast -surgery ,radiaology. chemotherapy  . Carpal tunnel syndrome    right hand  . Colon polyps   . Complication of anesthesia    Fentanyl, Versed-makes extra hyper, bradycardia x 1 episode in past  . Depression   . Dysplasia of vulva   . Hypertension   . S/P breast lumpectomy 07/04/07   R breast  . S/P radiation therapy 2009   Past Surgical History:  Procedure Laterality Date  . APPENDECTOMY     age 17  . CESAREAN SECTION     x 2  . COLONOSCOPY W/ POLYPECTOMY    . COLONOSCOPY WITH PROPOFOL N/A 04/27/2016   Procedure: COLONOSCOPY WITH PROPOFOL;  Surgeon: Garlan Fair, MD;  Location: [facility];  Service: Endoscopy;  Laterality: N/A;  . DILATION AND CURETTAGE OF UTERUS     multiple  . INTRAUTERINE DEVICE INSERTION    . IUD REMOVAL    . MASTECTOMY PARTIAL / LUMPECTOMY W/ AXILLARY LYMPHADENECTOMY    . TUBAL LIGATION    . VULVA SURGERY     Multiple times for dysplasia    Family History  Problem Relation Age  of Onset  . Colon cancer Mother 85  . Heart disease Father        heart attack  . Colon polyps Brother        one brother had large colon polyp that needed surgery to be removed  . Heart disease Paternal Grandfather     SOCIAL HISTORY: Social History   Socioeconomic History  . Marital status: Widowed    Spouse name: Not on file  . Number of children: 2  . Years of education: Not on file  . Highest education level: Not on file  Occupational History  . Occupation: Metallurgist: Arboriculturist  Tobacco Use  . Smoking status: Former Smoker    Packs/day: 0.75    Years: 20.00    Pack years: 15.00    Types: Cigarettes    Quit date: 05/06/1997    Years since quitting: 22.0  . Smokeless tobacco: Never Used  Substance and Sexual Activity  . Alcohol use: Yes    Alcohol/week: 0.0 standard drinks    Comment: occ  . Drug use: No  . Sexual activity: Never    Birth control/protection: Abstinence  Other Topics Concern  . Not on file  Social History Narrative  . Not on file   Social Determinants of Health   Financial Resource Strain:   .  Difficulty of Paying Living Expenses: Not on file  Food Insecurity:   . Worried About Charity fundraiser in the Last Year: Not on file  . Ran Out of Food in the Last Year: Not on file  Transportation Needs:   . Lack of Transportation (Medical): Not on file  . Lack of Transportation (Non-Medical): Not on file  Physical Activity:   . Days of Exercise per Week: Not on file  . Minutes of Exercise per Session: Not on file  Stress:   . Feeling of Stress : Not on file  Social Connections:   . Frequency of Communication with Friends and Family: Not on file  . Frequency of Social Gatherings with Friends and Family: Not on file  . Attends Religious Services: Not on file  . Active Member of Clubs or Organizations: Not on file  . Attends Archivist Meetings: Not on file  . Marital Status: Not on file  Intimate Partner Violence:     . Fear of Current or Ex-Partner: Not on file  . Emotionally Abused: Not on file  . Physically Abused: Not on file  . Sexually Abused: Not on file    Allergies  Allergen Reactions  . Prednisone Other (See Comments)  . Fentanyl Other (See Comments)    MAKES SUPER HYPER PER PT  . Midazolam Anxiety  . Vicodin [Hydrocodone-Acetaminophen] Anxiety    Extreme anxiety.    Current Outpatient Medications  Medication Sig Dispense Refill  . alprazolam (XANAX) 2 MG tablet TAKE 1/2 TO 1 (ONE-HALF TO ONE) TABLET BY MOUTH THREE TIMES DAILY AS NEEDED FOR ANXIETY 90 tablet 0  . aspirin 81 MG chewable tablet Chew 81 mg by mouth daily.    . Calcium Carbonate-Vitamin D (CALCIUM + D PO) Take 600 mg by mouth daily.      . cyclobenzaprine (FLEXERIL) 5 MG tablet Take 1-2 tablets (5-10 mg total) by mouth 3 (three) times daily as needed for muscle spasms. 30 tablet 0  . ibuprofen (ADVIL) 600 MG tablet Take 200 mg by mouth every 6 (six) hours as needed.     Marland Kitchen losartan-hydrochlorothiazide (HYZAAR) 50-12.5 MG tablet Take 1 tablet by mouth every morning. 90 tablet 2  . oxyCODONE-acetaminophen (PERCOCET) 10-325 MG tablet Take 1 tablet by mouth daily as needed for pain. To take one tablet once or twice daily as needed for back and hip pain. 30 tablet 0  . atorvastatin (LIPITOR) 10 MG tablet Take 1 tablet (10 mg total) by mouth daily. (Patient not taking: Reported on 06/01/2019) 30 tablet 2   No current facility-administered medications for this visit.    ROS:   General:  No weight loss, Fever, chills  HEENT: No recent headaches, no nasal bleeding, no visual changes, no sore throat  Neurologic: No dizziness, blackouts, seizures. No recent symptoms of stroke or mini- stroke. No recent episodes of slurred speech, or temporary blindness.  Cardiac: No recent episodes of chest pain/pressure, no shortness of breath at rest.  No shortness of breath with exertion.  Denies history of atrial fibrillation or irregular  heartbeat  Vascular: No history of rest pain in feet.  No history of claudication.  No history of non-healing ulcer, No history of DVT   Pulmonary: No home oxygen, no productive cough, no hemoptysis,  No asthma or wheezing  Musculoskeletal:  [ ]  Arthritis, [ ]  Low back pain,  [ ]  Joint pain  Hematologic:No history of hypercoagulable state.  No history of easy bleeding.  No history  of anemia  Gastrointestinal: No hematochezia or melena,  No gastroesophageal reflux, no trouble swallowing  Urinary: [ ]  chronic Kidney disease, [ ]  on HD - [ ]  MWF or [ ]  TTHS, [ ]  Burning with urination, [ ]  Frequent urination, [ ]  Difficulty urinating;   Skin: No rashes  Psychological: No history of anxiety,  No history of depression   Physical Examination  Vitals:   06/01/19 1354  BP: (!) 152/99  Pulse: 97  Resp: 20  Temp: 98.2 F (36.8 C)  SpO2: 98%  Weight: 198 lb (89.8 kg)  Height: 5\' 9"  (1.753 m)    Body mass index is 29.24 kg/m.  General:  Alert and oriented, no acute distress HEENT: Normal Neck: No JVD Pulmonary: Clear to auscultation bilaterally Cardiac: Regular Rate and Rhythm  Abdomen: Soft, non-tender, non-distended, no mass Skin: No rash Extremity Pulses:  2+ radial, brachial, femoral, dorsalis pedis pulses bilaterally Musculoskeletal: No deformity or edema  Neurologic: Upper and lower extremity motor 5/5 and symmetric  DATA:  We repeated the patient's carotid duplex exam today which again shows greater than 80% stenosis with velocities over 400 no significant left side stenosis antegrade vertebral flow  ASSESSMENT: Succussion with the patient today possibility of right carotid endarterectomy for stroke prophylaxis.  We also discussed the possibility of TCAR stenting.  However she is not currently on a statin or Plavix so she is not really a candidate for that as long as she is refusing those medications.  However, anatomically speaking and overall health wise she looks like  a reasonable candidate for open carotid endarterectomy.  I discussed with her risk benefits and possible complications of carotid endarterectomy including not limited to bleeding infection stroke risk 1 to 2% cranial nerve injury risk 5 to 10% usually transient.  Myocardial risk of 5%.  She understands and agrees to proceed.  He is very anxious regarding the procedure.  I spent approximately 15 minutes today counseling her with her anxiety regarding the procedure.  This was in addition to the 15 to 20 minutes discussing the procedure.   PLAN: Right carotid endarterectomy scheduled for June 09, 2019.   Ruta Hinds, MD [facility] Office: [phone] Pager: [phone]

## 2019-06-06 ENCOUNTER — Encounter (HOSPITAL_COMMUNITY): Payer: Self-pay

## 2019-06-06 ENCOUNTER — Encounter (HOSPITAL_COMMUNITY)
Admission: RE | Admit: 2019-06-06 | Discharge: 2019-06-06 | Disposition: A | Discharge status: Home / Self Care | Payer: Medicare Other | Source: Ambulatory Visit | Attending: Vascular Surgery | Admitting: Vascular Surgery

## 2019-06-06 ENCOUNTER — Other Ambulatory Visit (HOSPITAL_COMMUNITY)
Admission: RE | Admit: 2019-06-06 | Discharge: 2019-06-06 | Disposition: A | Discharge status: Home / Self Care | Payer: Medicare Other | Source: Ambulatory Visit | Attending: Vascular Surgery | Admitting: Vascular Surgery

## 2019-06-06 ENCOUNTER — Other Ambulatory Visit: Payer: Self-pay

## 2019-06-06 DIAGNOSIS — Z7982 Long term (current) use of aspirin: Secondary | ICD-10-CM | POA: Insufficient documentation

## 2019-06-06 DIAGNOSIS — I10 Essential (primary) hypertension: Secondary | ICD-10-CM | POA: Insufficient documentation

## 2019-06-06 DIAGNOSIS — I739 Peripheral vascular disease, unspecified: Secondary | ICD-10-CM | POA: Insufficient documentation

## 2019-06-06 DIAGNOSIS — Z20828 Contact with and (suspected) exposure to other viral communicable diseases: Secondary | ICD-10-CM | POA: Insufficient documentation

## 2019-06-06 DIAGNOSIS — Z87891 Personal history of nicotine dependence: Secondary | ICD-10-CM | POA: Insufficient documentation

## 2019-06-06 DIAGNOSIS — Z01812 Encounter for preprocedural laboratory examination: Secondary | ICD-10-CM | POA: Insufficient documentation

## 2019-06-06 DIAGNOSIS — I6521 Occlusion and stenosis of right carotid artery: Secondary | ICD-10-CM | POA: Insufficient documentation

## 2019-06-06 DIAGNOSIS — Z79899 Other long term (current) drug therapy: Secondary | ICD-10-CM | POA: Insufficient documentation

## 2019-06-06 HISTORY — DX: Atherosclerotic heart disease of native coronary artery without angina pectoris: I25.10

## 2019-06-06 HISTORY — DX: Palpitations: R00.2

## 2019-06-06 LAB — COMPREHENSIVE METABOLIC PANEL
ALT: 20 U/L (ref 0–44)
AST: 21 U/L (ref 15–41)
Albumin: 4.2 g/dL (ref 3.5–5.0)
Alkaline Phosphatase: 56 U/L (ref 38–126)
Anion gap: 10 (ref 5–15)
BUN: 10 mg/dL (ref 8–23)
CO2: 28 mmol/L (ref 22–32)
Calcium: 9.6 mg/dL (ref 8.9–10.3)
Chloride: 100 mmol/L (ref 98–111)
Creatinine, Ser: 0.7 mg/dL (ref 0.44–1.00)
GFR calc Af Amer: >60 mL/min (ref >60)
GFR calc non Af Amer: >60 mL/min (ref >60)
Glucose, Bld: 111 mg/dL — ABNORMAL HIGH (ref 70–99)
Potassium: 3.9 mmol/L (ref 3.5–5.1)
Sodium: 138 mmol/L (ref 135–145)
Total Bilirubin: 0.6 mg/dL (ref 0.3–1.2)
Total Protein: 7.7 g/dL (ref 6.5–8.1)

## 2019-06-06 LAB — TYPE AND SCREEN
ABO/RH(D): O POS
Antibody Screen: NEGATIVE

## 2019-06-06 LAB — SURGICAL PCR SCREEN
MRSA, PCR: NEGATIVE
Staphylococcus aureus: NEGATIVE

## 2019-06-06 LAB — URINALYSIS, ROUTINE W REFLEX MICROSCOPIC
Bacteria, UA: NONE SEEN
Bilirubin Urine: NEGATIVE
Glucose, UA: NEGATIVE mg/dL
Ketones, ur: NEGATIVE mg/dL
Leukocytes,Ua: NEGATIVE
Nitrite: NEGATIVE
Protein, ur: NEGATIVE mg/dL
Specific Gravity, Urine: 1.005 (ref 1.005–1.030)
pH: 7 (ref 5.0–8.0)

## 2019-06-06 LAB — CBC
HCT: 48.6 % — ABNORMAL HIGH (ref 36.0–46.0)
Hemoglobin: 16 g/dL — ABNORMAL HIGH (ref 12.0–15.0)
MCH: 31.6 pg (ref 26.0–34.0)
MCHC: 32.9 g/dL (ref 30.0–36.0)
MCV: 95.9 fL (ref 80.0–100.0)
Platelets: 429 10*3/uL — ABNORMAL HIGH (ref 150–400)
RBC: 5.07 MIL/uL (ref 3.87–5.11)
RDW: 12.7 % (ref 11.5–15.5)
WBC: 7.7 10*3/uL (ref 4.0–10.5)
nRBC: 0 % (ref 0.0–0.2)

## 2019-06-06 LAB — APTT: aPTT: 33 seconds (ref 24–36)

## 2019-06-06 LAB — ABO/RH: ABO/RH(D): O POS

## 2019-06-06 LAB — PROTIME-INR
INR: 0.9 (ref 0.8–1.2)
Prothrombin Time: 12.4 seconds (ref 11.4–15.2)

## 2019-06-06 NOTE — Progress Notes (Signed)
PCP - jeffrey green [place] Cardiologist - ganji  PPM/ICD - na Device Orders -  Rep Notified -   Chest x-ray - na EKG - 01/11/19 Stress Test - 04/04/18 ECHO - 03/08/18 Cardiac Cath - na  Sleep Study - na CPAP -   Fasting Blood Sugar -  na Checks Blood Sugar _____ times a day  Blood Thinner Instructions: Aspirin Instructions:continue  ERAS Protcol -na PRE-SURGERY Ensure or G2- na  COVID TEST- 06/06/19   Anesthesia review: heart hx.  Patient denies shortness of breath, fever, cough and chest pain at PAT appointment   All instructions explained to the patient, with a verbal understanding of the material. Patient agrees to go over the instructions while at home for a better understanding. Patient also instructed to self quarantine after being tested for COVID-19. The opportunity to ask questions was provided.

## 2019-06-06 NOTE — Pre-Procedure Instructions (Addendum)
[facility]  06/06/2019      [facility] - [place], [place] [address] Phone: [phone] Fax: [phone]  [id number] - [place], [place] - [address] [place] [address] Phone: [phone] Fax: [place], [place] [address] Phone: [phone] Fax: [phone]  [facility] [place], [place] - [address] [place] [address] [place] [address] [place] [address] Phone: [phone] Fax: [phone]    Your procedure is scheduled on Dec. 18  Report to [facility] Entrance A at 5:30 A.M.  Call this number if you have problems the morning of surgery:  [phone]   Remember:  Do not eat or drink after midnight.      Take these medicines the morning of surgery with A SIP OF WATER :             Alprazolam (xanax) if needed            aspirin  7 days prior to surgery STOP taking  Aleve, Naproxen, Ibuprofen, Motrin, Advil, Goody's, BC's, all herbal medications, fish oil, and all vitamins.                   Do not wear jewelry, make-up or nail polish.  Do not wear lotions, powders, or perfumes, or deodorant.  Do not shave 48 hours prior to surgery.  Men may shave face and neck.  Do not bring valuables to the hospital.  [facility] is not responsible for any belongings or valuables.  Contacts, dentures or bridgework may not be worn into surgery.  Leave your suitcase in the car.  After surgery it may be brought to your room.  For patients admitted to the hospital, discharge time will be determined by your treatment team.  Patients discharged the day of surgery will not be allowed to drive home.    Special instructions:   [place]- Preparing For Surgery  Before  surgery, you can play an important role. Because skin is not sterile, your skin needs to be as free of germs as possible. You can reduce the number of germs on your skin by washing with CHG (chlorahexidine gluconate) Soap before surgery.  CHG is an antiseptic cleaner which kills germs and bonds with the skin to continue killing germs even after washing.    Oral Hygiene is also important to reduce your risk of infection.  Remember - BRUSH YOUR TEETH THE MORNING OF SURGERY WITH YOUR REGULAR TOOTHPASTE  Please do not use if you have an allergy to CHG or antibacterial soaps. If your skin becomes reddened/irritated stop using the CHG.  Do not shave (including legs and underarms) for at least 48 hours prior to first CHG shower. It is OK to shave your face.  Please follow these instructions carefully.   1. Shower the NIGHT BEFORE SURGERY and the MORNING OF SURGERY with CHG.   2. If you chose to wash your hair, wash your hair first as usual with your normal shampoo.  3. After you shampoo, rinse your hair and body thoroughly to remove the shampoo.  4. Use CHG as you would any other liquid soap. You can apply CHG directly to the  skin and wash gently with a scrungie or a clean washcloth.   5. Apply the CHG Soap to your body ONLY FROM THE NECK DOWN.  Do not use on open wounds or open sores. Avoid contact with your eyes, ears, mouth and genitals (private parts). Wash Face and genitals (private parts)  with your normal soap.  6. Wash thoroughly, paying special attention to the area where your surgery will be performed.  7. Thoroughly rinse your body with warm water from the neck down.  8. DO NOT shower/wash with your normal soap after using and rinsing off the CHG Soap.  9. Pat yourself dry with a CLEAN TOWEL.  10. Wear CLEAN PAJAMAS to bed the night before surgery, wear comfortable clothes the morning of surgery  11. Place CLEAN SHEETS on your bed the night of your first shower and DO NOT SLEEP WITH  PETS.    Day of Surgery:  Do not apply any deodorants/lotions.  Please wear clean clothes to the hospital/surgery center.   Remember to brush your teeth WITH YOUR REGULAR TOOTHPASTE.    Please read over the following fact sheets that you were given. Coughing and Deep Breathing and Surgical Site Infection Prevention

## 2019-06-06 NOTE — Anesthesia Preprocedure Evaluation (Addendum)
Anesthesia Evaluation  Patient identified by MRN, date of birth, ID band Patient awake    Reviewed: Allergy & Precautions, H&P , NPO status , Patient's Chart, lab work & pertinent test results  Airway Mallampati: II   Neck ROM: full    Dental   Pulmonary former smoker,    breath sounds clear to auscultation       Cardiovascular hypertension, + Peripheral Vascular Disease   Rhythm:regular Rate:Normal     Neuro/Psych PSYCHIATRIC DISORDERS Anxiety Depression  Neuromuscular disease    GI/Hepatic   Endo/Other    Renal/GU      Musculoskeletal  (+) Arthritis ,   Abdominal   Peds  Hematology   Anesthesia Other Findings   Reproductive/Obstetrics                             Anesthesia Physical Anesthesia Plan  ASA: III  Anesthesia Plan: General   Post-op Pain Management:    Induction: Intravenous  PONV Risk Score and Plan: 3 and Ondansetron, Dexamethasone, Midazolam and Treatment may vary due to age or medical condition  Airway Management Planned: Oral ETT  Additional Equipment: Arterial line  Intra-op Plan:   Post-operative Plan: Extubation in OR  Informed Consent: I have reviewed the patients History and Physical, chart, labs and discussed the procedure including the risks, benefits and alternatives for the proposed anesthesia with the patient or authorized representative who has indicated his/her understanding and acceptance.       Plan Discussed with: CRNA and Anesthesiologist  Anesthesia Plan Comments: (See PAT note written 06/06/2019 by Myra Gianotti, PA-C. She has significant anxiety and does not want to remove ring. She developed 2nd-3rd degree AV block with HR 30-40's after 08/15/11 hysteroscopy at [facility]--cardiology saw her and felt neostigmine may have resulted in AV nodal block. She does not want neostigmine given. Current cardiologist is Dr. Einar Gip. )        Anesthesia Quick Evaluation

## 2019-06-06 NOTE — Progress Notes (Signed)
Anesthesia PAT Evaluation:   Case: [id number] Date/Time: 06/09/19 0715   Procedure: ENDARTERECTOMY CAROTID RIGHT (Right )   Anesthesia type: General   Pre-op diagnosis: RIGHT CAROTID ARTERY STENOSIS   Location: [place] OR ROOM 29 / [facility]   Surgeons: Elam Dutch, MD      DISCUSSION: Patient is a 63 year old female scheduled for the above procedure. She was referred to [facility] after recent carotid US showed progression of RICA stenosis. She has been asymptomatic.  History includes former smoker (quit 1998), anxiety, HTN, right breast cancer (s/p right breast lumpectomy 07/04/07, right axillary node dissection 08/05/07, s/p radiation 2009; had recurrent breast cellulitis with ID evaluation 09/2011), HLD (declined statins 04/2019), CAD (coronary calcifications on 02/20/19 CT), PSVT (s/p adenosine 08/04/16), vulvar squamous cell carcinoma (stage 1B, s/p multiple procedures at [facility], including partial radical vulvectomy, bilateral groin sentinel lymph node sampling 06/21/18; radical excision of vulva 04/26/18; colposcopy with cervical and vulvar biopsies 03/01/18 & 08/27/15; anterior vulvar excision and right medial thigh lesion excision 10/22/15; excision of right vulvar dysplasia 11/22/12; hysteroscopy, D&D, wide local excision of vulvar dysplasia 08/15/11).  Anesthesia Complication History: Following her 08/15/11 hysteroscopy under [facility] (received ketamine, midazolam, fentanyl, glycopyrrolate, lidocaine 2%, propofol bolus, rocuronium, neostigmine, ondansetron, phenylephrine), she developed heart block. [facility] Anesthesia post-procedure note (can be viewed in Care Everywhere) states, "Patient went into 3rd degree heart block in PACU, asymptomatic, EKG confirmed ECG strip diagnosis, but patient spontaneously broke into NSR. BMP, Mag, Phos, Trop, CK, CPK sent, and recommended cardiology consult and monitored bed. Cardiology has seen patient prior to PACU d/c." According to cardiology Norville Haggard, MD consult note, patient had  "HR=30-40's, ,hemodynamically stable, asymptomatic. ECG concerning for complete heart block." He notes that baseline EKG on 08/03/11 showed NSR, first degree AV block and 08/15/11 EKGs in PACU showed "NSR, Mobitz I 2nd degree AV block with occ junctional escape, HR=40s" followed by EKG one minute later showing "Sinus brady, 3:1 AV conduction with junctional escape complexes". He reviewed her intraoperative medication and felt that neostigmine may have resulted in AV nodal block. His A/P noted Mobitz 1 2nd degree AV block with recommendation to avoid AV nodal blocking agents, but no indication for pacing at that time. Echo then showed normal LVEF.  - Her [facility] anesthesia records since 2013 can be viewed in [place]. For her 06/21/18 surgery, her intraoperative medications included fentanyl 50 mcg, lidocaine 2%, propofol bolus, rocuronium, phenylephrine, ondansetron, dexamethasone, dexmedetomidine, ketamine, hydromorphone, sugammadex).    Of note, she expressed to her PAT RN that she does not want to remove her ring that was reportedly given to her prior to his death, and she has never removed it. She reported that for her surgeries at [facility], the ring was covered and taped. She can discuss further with her anesthesiologist on the day of surgery.     06/06/19 COVID-19 test in process.   VS: BP (!) 137/94   Pulse 80   Temp (!) 36.3 C (Oral)   Resp 20   Ht 5\' 9"  (1.753 m)   Wt 88.2 kg   SpO2 98%   BMI 28.71 kg/m    PROVIDERS: [name], MD is PCP  Adrian Prows, MD is cardiologist Fay Records, MD is GYN-ONC ([place]) - She is scheduled to get re-established with psychiatrist in 07/2019 for on-going management of anxiety. She reported that her PCP wanted to wean her off Xanax, but she has been reluctant given long term as needed use.  LABS: Labs reviewed: Acceptable for surgery. (all labs ordered are listed, but only abnormal results are displayed)  Labs Reviewed   CBC - Abnormal; Notable for the following components:      Result Value   Hemoglobin 16.0 (*)    HCT 48.6 (*)    Platelets 429 (*)    All other components within normal limits  COMPREHENSIVE METABOLIC PANEL - Abnormal; Notable for the following components:   Glucose, Bld 111 (*)    All other components within normal limits  URINALYSIS, ROUTINE W REFLEX MICROSCOPIC - Abnormal; Notable for the following components:   Color, Urine STRAW (*)    Hgb urine dipstick SMALL (*)    All other components within normal limits  SURGICAL PCR SCREEN  APTT  PROTIME-INR  TYPE AND SCREEN  ABO/RH     IMAGES: CT abd 02/20/2019: IMPRESSION: 1. No acute findings noted in the abdomen to account for the patient's symptoms. 2. No findings to suggest hepatic cirrhosis or hepatic steatosis. 3. Two small left adrenal adenomas. 4. Aortic atherosclerosis, in addition to at least 2 vessel coronary artery disease. Please note that although the presence of coronary artery calcium documents the presence of coronary artery disease, the severity of this disease and any potential stenosis cannot be assessed on this non-gated CT examination. Assessment for potential risk factor modification, dietary therapy or pharmacologic therapy may be warranted, if clinically indicated. 5. There are calcifications of the aortic valve. Echocardiographic correlation for evaluation of potential valvular dysfunction may be warranted if clinically indicated. 6. Superior endplate compression fracture of L1 with approximately 30% loss of anterior vertebral body height.  US Abdomen 01/11/2019: FINDINGS: - Gallbladder: No gallstones or wall thickening visualized. No sonographic Murphy sign noted by sonographer. - Common bile duct: Diameter: 5.4 mm, normal. - Liver: No focal lesion identified. Patchy areas of increased echogenicity in the liver consistent with fatty infiltration. Portal vein is patent on color Doppler imaging  with normal direction of blood flow towards the liver. IMPRESSION: No acute abnormality.  Probable hepatic steatosis.  CXR 01/11/2019: FINDINGS: The heart size and mediastinal contours are within normal limits. Both lungs are clear. The visualized skeletal structures are unremarkable. No definitive rib abnormality is noted. Postsurgical changes in the right axilla are seen. IMPRESSION: No acute abnormality noted.   EKG: 01/11/2019: Sinus rhythm Prolonged PR interval Borderline left axis deviation Low voltage, precordial leads Abnormal R-wave progression, early transition Confirmed by Veryl Speak [phone]) on 01/11/2019 5:36:05 PM Also confirmed by Veryl Speak [phone]), editor Philomena Doheny [phone]) on 01/12/2019 7:27:56 AM   CV: Carotid artery duplex 06/01/2019: - Right Carotid: Velocities in the right ICA are consistent with a 80-99% stenosis. See pre surgical information above. - Left Carotid: Velocities in the left ICA are consistent with a 1-39% stenosis. - Vertebrals:  Bilateral vertebral arteries demonstrate antegrade flow. - Subclavians: Normal flow hemodynamics were seen in bilateral subclavian arteries.  Exercise sestamibi stress test 04/04/2018: 1. The resting electrocardiogram demonstrated normal sinus rhythm, LAD, LAFB, incomplete RBBB, poor R progression and no resting arrhythmias. The stress electrocardiogram was normal. Patient exercised on Bruce protocol for 4:55 minutes and achieved 6.95 METS. Stress test terminated due to chest pressure and 87% MPHR achieved (Target HR >85%). Resting BP 150/88 and pea BP 218/102 mm hg. 2. Myocardial perfusion imaging is normal. Overall left ventricular systolic function was normal without regional wall motion abnormalities. The left ventricular ejection fraction was 72%. This is a low risk study. Chest pain could be  related to hypertensive heart disease. Clinical correlation recommended.  Event monitor 30 days 03/11/2018:  Episodic transmission 03/20/2018 1:30 pm: 5 beat NSVT.One brief atrial tach @ 142/min. Frequent PAC in bigeminal pattern, symptomatic. Rare PVC.  Echocardiogram 03/08/2018: Left ventricle cavity is normal in size. Moderate concentric hypertrophy of the left ventricle. Normal global wall motion. Doppler evidence of grade I (impaired) diastolic dysfunction, elevated LAP. Calculated EF 60%. Mild aortic valve leaflet calcification. Severely restricted aortic valve leaflets. Mild aortic valve stenosis. Trace aortic regurgitation. Aortic valve peak pressure gradient of 23 and mean gradient of 13 mmHg, calculated aortic valve area 1.59 cm. In 2-D, the valve appears to be severely restricted. Compared to the echocardiogram 09/09/2016, mean gradient has increased from 13 mmHg. Otherwise no significant change. Consider TEE if clinically indicated.    Past Medical History:  Diagnosis Date  . Anxiety   . Arthritis    low back and hip pain intermittent  . Breast cancer ([place]) 06/02/07   r breast -surgery ,radiaology. chemotherapy  . Carpal tunnel syndrome    right hand  . Colon polyps   . Complication of anesthesia    Fentanyl, Versed-makes extra hyper, bradycardia x 1 in PACU, [facility] (08/15/11 cardiology felt neostigmine may have resulted in AV nodal block)   . Coronary artery disease   . Depression    denies  . Dysplasia of vulva   . Hypertension   . Palpitations    PSVT, s/p adenosine 08/04/16  . S/P breast lumpectomy 07/04/07   R breast  . S/P radiation therapy 2009    Past Surgical History:  Procedure Laterality Date  . APPENDECTOMY     age 7  . CESAREAN SECTION     x 2  . COLONOSCOPY W/ POLYPECTOMY    . COLONOSCOPY WITH PROPOFOL N/A 04/27/2016   Procedure: COLONOSCOPY WITH PROPOFOL;  Surgeon: Garlan Fair, MD;  Location: [facility] ENDOSCOPY;  Service: Endoscopy;  Laterality: N/A;  . DILATION AND CURETTAGE OF UTERUS     multiple  . INTRAUTERINE DEVICE INSERTION    . IUD REMOVAL    .  MASTECTOMY PARTIAL / LUMPECTOMY W/ AXILLARY LYMPHADENECTOMY Right    lumpectomy and lymph nodes removed  . TUBAL LIGATION    . VULVA SURGERY     Multiple times for dysplasia    MEDICATIONS: . alprazolam (XANAX) 2 MG tablet  . aspirin 81 MG chewable tablet  . atorvastatin (LIPITOR) 10 MG tablet  . cyclobenzaprine (FLEXERIL) 5 MG tablet  . losartan-hydrochlorothiazide (HYZAAR) 50-12.5 MG tablet  . oxyCODONE-acetaminophen (PERCOCET) 10-325 MG tablet   No current facility-administered medications for this encounter.    Myra Gianotti, PA-C Surgical Short Stay/Anesthesiology [facility] Phone [phone] [facility] [place] Phone [phone] 06/06/2019 8:09 PM

## 2019-06-07 LAB — NOVEL CORONAVIRUS, NAA (HOSP ORDER, SEND-OUT TO REF LAB; TAT 18-24 HRS): SARS-CoV-2, NAA: NOT DETECTED

## 2019-06-09 ENCOUNTER — Encounter (HOSPITAL_COMMUNITY): Payer: Self-pay | Admitting: Vascular Surgery

## 2019-06-09 ENCOUNTER — Inpatient Hospital Stay (HOSPITAL_COMMUNITY): Payer: Medicare Other | Admitting: Vascular Surgery

## 2019-06-09 ENCOUNTER — Inpatient Hospital Stay (HOSPITAL_COMMUNITY): Payer: Medicare Other

## 2019-06-09 ENCOUNTER — Encounter (HOSPITAL_COMMUNITY)
Admission: RE | Disposition: A | Discharge status: Home / Self Care | Payer: Self-pay | Source: Home / Self Care | Attending: Vascular Surgery

## 2019-06-09 ENCOUNTER — Inpatient Hospital Stay (HOSPITAL_COMMUNITY)
Admission: RE | Admit: 2019-06-09 | Discharge: 2019-06-11 | DRG: 039 | Disposition: A | Discharge status: Home / Self Care | Payer: Medicare Other | Attending: Vascular Surgery | Admitting: Vascular Surgery

## 2019-06-09 ENCOUNTER — Other Ambulatory Visit: Payer: Self-pay

## 2019-06-09 DIAGNOSIS — Z853 Personal history of malignant neoplasm of breast: Secondary | ICD-10-CM

## 2019-06-09 DIAGNOSIS — Z884 Allergy status to anesthetic agent status: Secondary | ICD-10-CM

## 2019-06-09 DIAGNOSIS — M479 Spondylosis, unspecified: Secondary | ICD-10-CM | POA: Diagnosis present

## 2019-06-09 DIAGNOSIS — Z8371 Family history of colonic polyps: Secondary | ICD-10-CM | POA: Diagnosis not present

## 2019-06-09 DIAGNOSIS — F419 Anxiety disorder, unspecified: Secondary | ICD-10-CM | POA: Diagnosis present

## 2019-06-09 DIAGNOSIS — Z8249 Family history of ischemic heart disease and other diseases of the circulatory system: Secondary | ICD-10-CM | POA: Diagnosis not present

## 2019-06-09 DIAGNOSIS — I10 Essential (primary) hypertension: Secondary | ICD-10-CM | POA: Diagnosis present

## 2019-06-09 DIAGNOSIS — Z20828 Contact with and (suspected) exposure to other viral communicable diseases: Secondary | ICD-10-CM | POA: Diagnosis present

## 2019-06-09 DIAGNOSIS — Z800 Family history of malignant neoplasm of digestive organs: Secondary | ICD-10-CM | POA: Diagnosis not present

## 2019-06-09 DIAGNOSIS — Z885 Allergy status to narcotic agent status: Secondary | ICD-10-CM

## 2019-06-09 DIAGNOSIS — Z923 Personal history of irradiation: Secondary | ICD-10-CM

## 2019-06-09 DIAGNOSIS — I6521 Occlusion and stenosis of right carotid artery: Principal | ICD-10-CM | POA: Diagnosis present

## 2019-06-09 DIAGNOSIS — Z888 Allergy status to other drugs, medicaments and biological substances status: Secondary | ICD-10-CM

## 2019-06-09 DIAGNOSIS — Z87891 Personal history of nicotine dependence: Secondary | ICD-10-CM | POA: Diagnosis not present

## 2019-06-09 DIAGNOSIS — M1610 Unilateral primary osteoarthritis, unspecified hip: Secondary | ICD-10-CM | POA: Diagnosis present

## 2019-06-09 DIAGNOSIS — R001 Bradycardia, unspecified: Secondary | ICD-10-CM | POA: Diagnosis not present

## 2019-06-09 DIAGNOSIS — I440 Atrioventricular block, first degree: Secondary | ICD-10-CM | POA: Diagnosis present

## 2019-06-09 DIAGNOSIS — Z7982 Long term (current) use of aspirin: Secondary | ICD-10-CM | POA: Diagnosis not present

## 2019-06-09 DIAGNOSIS — M545 Low back pain, unspecified: Secondary | ICD-10-CM

## 2019-06-09 HISTORY — PX: PATCH ANGIOPLASTY: SHX6230

## 2019-06-09 HISTORY — PX: ENDARTERECTOMY: SHX5162

## 2019-06-09 SURGERY — ENDARTERECTOMY, CAROTID
Anesthesia: General | Site: Neck | Laterality: Right

## 2019-06-09 MED ORDER — LABETALOL HCL 5 MG/ML IV SOLN: 10.0000 mg | INTRAVENOUS | Status: DC | PRN

## 2019-06-09 MED ORDER — SODIUM CHLORIDE 0.9 % IV SOLN: 500.0000 mL | Freq: Once | INTRAVENOUS | Status: DC | PRN

## 2019-06-09 MED ORDER — MIDAZOLAM HCL 5 MG/5ML IJ SOLN
INTRAMUSCULAR | Status: DC | PRN
  Administered 2019-06-09: 2 mg via INTRAVENOUS

## 2019-06-09 MED ORDER — WHITE PETROLATUM EX OINT
TOPICAL_OINTMENT | CUTANEOUS | Status: AC
  Filled 2019-06-09: qty 28.35

## 2019-06-09 MED ORDER — LIDOCAINE 2% (20 MG/ML) 5 ML SYRINGE
INTRAMUSCULAR | Status: AC
  Filled 2019-06-09: qty 5

## 2019-06-09 MED ORDER — HYDROMORPHONE HCL 1 MG/ML IJ SOLN
INTRAMUSCULAR | Status: AC
  Filled 2019-06-09: qty 2

## 2019-06-09 MED ORDER — ROCURONIUM BROMIDE 10 MG/ML (PF) SYRINGE
PREFILLED_SYRINGE | INTRAVENOUS | Status: AC
  Filled 2019-06-09: qty 10

## 2019-06-09 MED ORDER — SODIUM CHLORIDE 0.9 % IV SOLN
INTRAVENOUS | Status: DC | PRN
  Administered 2019-06-09: 500 mL

## 2019-06-09 MED ORDER — MORPHINE SULFATE (PF) 2 MG/ML IV SOLN
2.0000 mg | INTRAVENOUS | Status: DC | PRN
  Administered 2019-06-09 – 2019-06-11 (×9): 2 mg via INTRAVENOUS
  Filled 2019-06-09 (×10): qty 1

## 2019-06-09 MED ORDER — SODIUM CHLORIDE 0.9 % IV SOLN
0.0500 ug/kg/min | INTRAVENOUS | Status: AC
  Administered 2019-06-09: .2 ug/kg/min via INTRAVENOUS
  Filled 2019-06-09: qty 4000

## 2019-06-09 MED ORDER — SODIUM CHLORIDE 0.9 % IV SOLN: INTRAVENOUS | Status: DC

## 2019-06-09 MED ORDER — PROPOFOL 10 MG/ML IV BOLUS
INTRAVENOUS | Status: AC
  Filled 2019-06-09: qty 20

## 2019-06-09 MED ORDER — CHLORHEXIDINE GLUCONATE 4 % EX LIQD: 60.0000 mL | Freq: Once | CUTANEOUS | Status: DC

## 2019-06-09 MED ORDER — HYDROMORPHONE HCL 1 MG/ML IJ SOLN
0.5000 mg | INTRAMUSCULAR | Status: AC | PRN
  Administered 2019-06-09 (×4): 0.5 mg via INTRAVENOUS

## 2019-06-09 MED ORDER — PHENYLEPHRINE HCL-NACL 10-0.9 MG/250ML-% IV SOLN
INTRAVENOUS | Status: DC | PRN
  Administered 2019-06-09: 30 ug/min via INTRAVENOUS

## 2019-06-09 MED ORDER — SUGAMMADEX SODIUM 200 MG/2ML IV SOLN
INTRAVENOUS | Status: DC | PRN
  Administered 2019-06-09: 200 mg via INTRAVENOUS

## 2019-06-09 MED ORDER — DEXAMETHASONE SODIUM PHOSPHATE 10 MG/ML IJ SOLN
INTRAMUSCULAR | Status: AC
  Filled 2019-06-09: qty 1

## 2019-06-09 MED ORDER — HEPARIN SODIUM (PORCINE) 1000 UNIT/ML IJ SOLN
INTRAMUSCULAR | Status: DC | PRN
  Administered 2019-06-09: 9000 [IU] via INTRAVENOUS

## 2019-06-09 MED ORDER — DEXMEDETOMIDINE HCL 200 MCG/2ML IV SOLN
INTRAVENOUS | Status: DC | PRN
  Administered 2019-06-09 (×2): 4 ug via INTRAVENOUS
  Administered 2019-06-09: 8 ug via INTRAVENOUS
  Administered 2019-06-09 (×2): 4 ug via INTRAVENOUS
  Administered 2019-06-09: 8 ug via INTRAVENOUS
  Administered 2019-06-09 (×2): 4 ug via INTRAVENOUS

## 2019-06-09 MED ORDER — 0.9 % SODIUM CHLORIDE (POUR BTL) OPTIME
TOPICAL | Status: DC | PRN
  Administered 2019-06-09: 1000 mL

## 2019-06-09 MED ORDER — FENTANYL CITRATE (PF) 250 MCG/5ML IJ SOLN
INTRAMUSCULAR | Status: AC
  Filled 2019-06-09: qty 5

## 2019-06-09 MED ORDER — POTASSIUM CHLORIDE CRYS ER 20 MEQ PO TBCR: 20.0000 meq | EXTENDED_RELEASE_TABLET | Freq: Every day | ORAL | Status: DC | PRN

## 2019-06-09 MED ORDER — PHENOL 1.4 % MT LIQD: 1.0000 | OROMUCOSAL | Status: DC | PRN

## 2019-06-09 MED ORDER — PROPOFOL 10 MG/ML IV BOLUS
INTRAVENOUS | Status: DC | PRN
  Administered 2019-06-09: 170 mg via INTRAVENOUS

## 2019-06-09 MED ORDER — ALUM & MAG HYDROXIDE-SIMETH 200-200-20 MG/5ML PO SUSP: 15.0000 mL | ORAL | Status: DC | PRN

## 2019-06-09 MED ORDER — OXYCODONE-ACETAMINOPHEN 5-325 MG PO TABS
1.0000 | ORAL_TABLET | Freq: Four times a day (QID) | ORAL | Status: DC | PRN
  Administered 2019-06-09 – 2019-06-11 (×7): 1 via ORAL
  Filled 2019-06-09 (×7): qty 1

## 2019-06-09 MED ORDER — LOSARTAN POTASSIUM 25 MG PO TABS
25.0000 mg | ORAL_TABLET | Freq: Every day | ORAL | Status: DC
  Administered 2019-06-10: 25 mg via ORAL
  Filled 2019-06-09: qty 1

## 2019-06-09 MED ORDER — EPHEDRINE SULFATE-NACL 50-0.9 MG/10ML-% IV SOSY
PREFILLED_SYRINGE | INTRAVENOUS | Status: DC | PRN
  Administered 2019-06-09: 5 mg via INTRAVENOUS
  Administered 2019-06-09: 10 mg via INTRAVENOUS
  Administered 2019-06-09 (×4): 5 mg via INTRAVENOUS

## 2019-06-09 MED ORDER — ACETAMINOPHEN 325 MG RE SUPP: 325.0000 mg | RECTAL | Status: DC | PRN

## 2019-06-09 MED ORDER — DEXAMETHASONE SODIUM PHOSPHATE 10 MG/ML IJ SOLN
INTRAMUSCULAR | Status: DC | PRN
  Administered 2019-06-09: 10 mg via INTRAVENOUS

## 2019-06-09 MED ORDER — HYDROCHLOROTHIAZIDE 10 MG/ML ORAL SUSPENSION
6.2500 mg | Freq: Every day | ORAL | Status: DC
  Administered 2019-06-10: 6.25 mg via ORAL
  Filled 2019-06-09 (×3): qty 1.25

## 2019-06-09 MED ORDER — LIDOCAINE HCL (PF) 1 % IJ SOLN
INTRAMUSCULAR | Status: AC
  Filled 2019-06-09: qty 30

## 2019-06-09 MED ORDER — CEFAZOLIN SODIUM-DEXTROSE 2-4 GM/100ML-% IV SOLN
2.0000 g | INTRAVENOUS | Status: AC
  Administered 2019-06-09: 2 g via INTRAVENOUS

## 2019-06-09 MED ORDER — ONDANSETRON HCL 4 MG/2ML IJ SOLN
INTRAMUSCULAR | Status: AC
  Filled 2019-06-09: qty 2

## 2019-06-09 MED ORDER — MIDAZOLAM HCL 2 MG/2ML IJ SOLN
INTRAMUSCULAR | Status: AC
  Filled 2019-06-09: qty 2

## 2019-06-09 MED ORDER — LACTATED RINGERS IV SOLN: INTRAVENOUS | Status: DC

## 2019-06-09 MED ORDER — DOCUSATE SODIUM 100 MG PO CAPS
100.0000 mg | ORAL_CAPSULE | Freq: Every day | ORAL | Status: DC
  Administered 2019-06-10 – 2019-06-11 (×2): 100 mg via ORAL
  Filled 2019-06-09 (×2): qty 1

## 2019-06-09 MED ORDER — GLYCOPYRROLATE 0.2 MG/ML IJ SOLN
INTRAMUSCULAR | Status: DC | PRN
  Administered 2019-06-09 (×2): .1 mg via INTRAVENOUS

## 2019-06-09 MED ORDER — LOSARTAN POTASSIUM-HCTZ 50-12.5 MG PO TABS: 0.5000 | ORAL_TABLET | Freq: Every day | ORAL | Status: DC

## 2019-06-09 MED ORDER — ONDANSETRON HCL 4 MG/2ML IJ SOLN: 4.0000 mg | Freq: Four times a day (QID) | INTRAMUSCULAR | Status: DC | PRN

## 2019-06-09 MED ORDER — ACETAMINOPHEN 325 MG PO TABS
325.0000 mg | ORAL_TABLET | ORAL | Status: DC | PRN
  Administered 2019-06-10 – 2019-06-11 (×2): 650 mg via ORAL
  Filled 2019-06-09 (×2): qty 2

## 2019-06-09 MED ORDER — PROTAMINE SULFATE 10 MG/ML IV SOLN
INTRAVENOUS | Status: DC | PRN
  Administered 2019-06-09: 90 mg via INTRAVENOUS

## 2019-06-09 MED ORDER — HYDRALAZINE HCL 20 MG/ML IJ SOLN: 5.0000 mg | INTRAMUSCULAR | Status: DC | PRN

## 2019-06-09 MED ORDER — ALPRAZOLAM 0.5 MG PO TABS
1.0000 mg | ORAL_TABLET | Freq: Three times a day (TID) | ORAL | Status: DC | PRN
  Administered 2019-06-09 – 2019-06-11 (×5): 2 mg via ORAL
  Filled 2019-06-09 (×5): qty 4

## 2019-06-09 MED ORDER — FENTANYL CITRATE (PF) 100 MCG/2ML IJ SOLN
INTRAMUSCULAR | Status: DC | PRN
  Administered 2019-06-09: 50 ug via INTRAVENOUS
  Administered 2019-06-09: 100 ug via INTRAVENOUS

## 2019-06-09 MED ORDER — POLYETHYLENE GLYCOL 3350 17 G PO PACK: 17.0000 g | PACK | Freq: Every day | ORAL | Status: DC | PRN

## 2019-06-09 MED ORDER — CEFAZOLIN SODIUM-DEXTROSE 2-4 GM/100ML-% IV SOLN
2.0000 g | Freq: Three times a day (TID) | INTRAVENOUS | Status: AC
  Administered 2019-06-09 – 2019-06-10 (×2): 2 g via INTRAVENOUS
  Filled 2019-06-09 (×2): qty 100

## 2019-06-09 MED ORDER — METOPROLOL TARTRATE 5 MG/5ML IV SOLN: 2.0000 mg | INTRAVENOUS | Status: DC | PRN

## 2019-06-09 MED ORDER — ASPIRIN 81 MG PO CHEW
81.0000 mg | CHEWABLE_TABLET | Freq: Every day | ORAL | Status: DC
  Administered 2019-06-10 – 2019-06-11 (×2): 81 mg via ORAL
  Filled 2019-06-09 (×2): qty 1

## 2019-06-09 MED ORDER — LACTATED RINGERS IV SOLN: INTRAVENOUS | Status: DC | PRN

## 2019-06-09 MED ORDER — ONDANSETRON HCL 4 MG/2ML IJ SOLN
INTRAMUSCULAR | Status: DC | PRN
  Administered 2019-06-09: 4 mg via INTRAVENOUS

## 2019-06-09 MED ORDER — CEFAZOLIN SODIUM-DEXTROSE 2-4 GM/100ML-% IV SOLN
INTRAVENOUS | Status: AC
  Filled 2019-06-09: qty 100

## 2019-06-09 MED ORDER — GUAIFENESIN-DM 100-10 MG/5ML PO SYRP: 15.0000 mL | ORAL_SOLUTION | ORAL | Status: DC | PRN

## 2019-06-09 MED ORDER — MAGNESIUM SULFATE 2 GM/50ML IV SOLN: 2.0000 g | Freq: Every day | INTRAVENOUS | Status: DC | PRN

## 2019-06-09 MED ORDER — ROCURONIUM BROMIDE 50 MG/5ML IV SOSY
PREFILLED_SYRINGE | INTRAVENOUS | Status: DC | PRN
  Administered 2019-06-09: 70 mg via INTRAVENOUS

## 2019-06-09 MED ORDER — BISACODYL 10 MG RE SUPP: 10.0000 mg | Freq: Every day | RECTAL | Status: DC | PRN

## 2019-06-09 MED ORDER — SODIUM CHLORIDE 0.9 % IV SOLN
INTRAVENOUS | Status: AC
  Filled 2019-06-09: qty 1.2

## 2019-06-09 MED ORDER — PANTOPRAZOLE SODIUM 40 MG PO TBEC
40.0000 mg | DELAYED_RELEASE_TABLET | Freq: Every day | ORAL | Status: DC
  Administered 2019-06-10: 40 mg via ORAL
  Filled 2019-06-09: qty 1

## 2019-06-09 MED ORDER — LIDOCAINE 2% (20 MG/ML) 5 ML SYRINGE
INTRAMUSCULAR | Status: DC | PRN
  Administered 2019-06-09: 50 mg via INTRAVENOUS

## 2019-06-09 SURGICAL SUPPLY — 41 items
CANISTER SUCT 3000ML PPV (MISCELLANEOUS) ×2 IMPLANT
CANNULA VESSEL 3MM 2 BLNT TIP (CANNULA) ×4 IMPLANT
CATH ROBINSON RED A/P 18FR (CATHETERS) ×2 IMPLANT
CLIP VESOCCLUDE MED 6/CT (CLIP) ×2 IMPLANT
CLIP VESOCCLUDE SM WIDE 6/CT (CLIP) ×2 IMPLANT
COVER WAND RF STERILE (DRAPES) ×1 IMPLANT
DECANTER SPIKE VIAL GLASS SM (MISCELLANEOUS) IMPLANT
DERMABOND ADVANCED (GAUZE/BANDAGES/DRESSINGS) ×1
DERMABOND ADVANCED .7 DNX12 (GAUZE/BANDAGES/DRESSINGS) ×1 IMPLANT
DRAIN HEMOVAC 1/8 X 5 (WOUND CARE) IMPLANT
ELECT REM PT RETURN 9FT ADLT (ELECTROSURGICAL) ×2
ELECTRODE REM PT RTRN 9FT ADLT (ELECTROSURGICAL) ×1 IMPLANT
EVACUATOR SILICONE 100CC (DRAIN) IMPLANT
GAUZE 4X4 16PLY RFD (DISPOSABLE) ×1 IMPLANT
GLOVE BIO SURGEON STRL SZ7.5 (GLOVE) ×2 IMPLANT
GOWN STRL REUS W/ TWL LRG LVL3 (GOWN DISPOSABLE) ×3 IMPLANT
GOWN STRL REUS W/TWL LRG LVL3 (GOWN DISPOSABLE) ×3
HEMOSTAT SPONGE AVITENE ULTRA (HEMOSTASIS) IMPLANT
KIT BASIN OR (CUSTOM PROCEDURE TRAY) ×2 IMPLANT
KIT SHUNT ARGYLE CAROTID ART 6 (VASCULAR PRODUCTS) IMPLANT
KIT TURNOVER KIT B (KITS) ×2 IMPLANT
NDL HYPO 25GX1X1/2 BEV (NEEDLE) IMPLANT
NEEDLE HYPO 25GX1X1/2 BEV (NEEDLE) IMPLANT
NS IRRIG 1000ML POUR BTL (IV SOLUTION) ×4 IMPLANT
PACK CAROTID (CUSTOM PROCEDURE TRAY) ×2 IMPLANT
PAD ARMBOARD 7.5X6 YLW CONV (MISCELLANEOUS) ×4 IMPLANT
PATCH HEMASHIELD 8X75 (Vascular Products) ×1 IMPLANT
POSITIONER HEAD DONUT 9IN (MISCELLANEOUS) ×2 IMPLANT
SHUNT CAROTID BYPASS 10 (VASCULAR PRODUCTS) ×1 IMPLANT
SHUNT CAROTID BYPASS 12FRX15.5 (VASCULAR PRODUCTS) IMPLANT
SUT ETHILON 3 0 PS 1 (SUTURE) IMPLANT
SUT PROLENE 6 0 CC (SUTURE) ×3 IMPLANT
SUT PROLENE BLUE 7 0 (SUTURE) ×1 IMPLANT
SUT SILK 3 0 TIES 17X18 (SUTURE)
SUT SILK 3-0 18XBRD TIE BLK (SUTURE) IMPLANT
SUT VIC AB 3-0 SH 27 (SUTURE) ×1
SUT VIC AB 3-0 SH 27X BRD (SUTURE) ×1 IMPLANT
SUT VICRYL 4-0 PS2 18IN ABS (SUTURE) ×2 IMPLANT
SYR CONTROL 10ML LL (SYRINGE) IMPLANT
TOWEL GREEN STERILE (TOWEL DISPOSABLE) ×2 IMPLANT
WATER STERILE IRR 1000ML POUR (IV SOLUTION) ×2 IMPLANT

## 2019-06-09 NOTE — Op Note (Signed)
Procedure: Right carotid endarterectomy  Preoperative diagnosis: 80% asymptomatic right internal carotid artery stenosis  Postoperative diagnosis: Same  Anesthesia General  Asst.: Leontine Locket, PAC  Operative findings: #1 greater than 80% right internal carotid stenosis                                                             #2 Dacron patch           #3 10 Fr shunt  Operative details: After obtaining informed consent, the patient was taken to the operating room. The patient was placed in a supine position on the operating room table. After induction of general anesthesia, the patient's entire neck and chest was prepped and draped in the usual sterile fashion. An oblique incision was made on the right aspect of the patient's neck anterior to the border the right sternocleidomastoid muscle. The incision was carried into the subcutaneous tissues and through the platysma. The sternocleidomastoid muscle was identified and reflected laterally. The omohyoid muscle was identified and this was divided with cautery. The common carotid artery was then found at the base of the incision this was dissected free circumferentially. It was fairly soft on palpation.  The vagus nerve was identified and protected. Dissection was then carried up to the level carotid bifurcation.   The hyperglossal nerve was above the primary area of dissection.  Its inferior surface was identified.  The ansa cervicalis was reflected laterally in order to provide exposure.  The internal carotid artery was dissected free circumferentially just below the level of the hypoglossal nerve and it was soft in character at this location and above any palpable disease.  It was thickened on palpation below this all the way to its origin.  Most of the disease seemed to be confined to the internal.  A vessel loop was placed around this. Next the external carotid and superior thyroid arteries were dissected free circumferentially and vessel loops  were placed around these. The patient was given 9000 units of intravenous heparin.  After 2 minutes of circulation time and raising the mean arterial pressure to 90 mm mercury, the distal internal carotid artery was controlled with small bulldog clamp. The external carotid and superior thyroid arteries were controlled with vessel loops. The common carotid artery was controlled with a peripheral DeBakey clamp. A longitudinal opening was made in the common carotid artery just below the bifurcation. The arteriotomy was extended distally up into the internal carotid with Potts scissors. There was a large rubbery plaque with greater than 80% stenosis in the internal carotid again most of this was above the bifurcation.  A 10 Fr shunt was brought onto the field and fashioned to fit the patient's artery.  This was threaded into the distal internal carotid artery and allowed to backbleed thoroughly.  There was good but not pulsatile backbleeding.  This was then threaded into the common carotid and secured with a Rummel tourniquet.   There was no air at this point and flow was restored to the brain.  Attention was then turned to the common carotid artery once again. A suitable endarterectomy plane was obtained and endarterectomy was begun in the common carotid artery and a good proximal endpoint was obtained. An eversion endarterectomy was performed on the external carotid artery and a good endpoint  was obtained. The plaque was then elevated in the internal carotid artery and there was a slight step off so I tacked the posterior wall with several 7 0 prolene sutures.  The plaque was passed off the table. All loose debris was then removed from the carotid bed and everything was thoroughly irrigated with heparinized saline. A Dacron patch was then brought on to the operative field and this was sewn on as a patch angioplasty using a running 6-0 Prolene suture. Prior to completion of the anastomosis the internal carotid artery  was thoroughly backbled. This was then controlled again with a fine bulldog clamp.  The common carotid was thoroughly flushed forward. The external carotid was also thoroughly backbled.  The remainder of the patch was completed and the anastomosis was secured. Flow was then restored first retrograde from the external carotid into the carotid bed then antegrade from the common carotid to the external carotid artery and after approximately 5 cardiac cycles to the internal carotid artery. Doppler was used to evaluate the external/internal and common carotid arteries and these all had good Doppler flow. Hemostasis was obtained with direct pressure. The patient was also given 90 mg of Protamine.      The platysma muscle was reapproximated using a running 3-0 Vicryl suture. The skin was closed with 4 0 Vicryl subcuticular stitch.  The patient was awakened in the operating room and was moving upper and lower extremities symmetrically and following commands.  The patient was stable on arrival to the PACU.  Ruta Hinds, MD [facility] Office: [phone] Pager: [phone]

## 2019-06-09 NOTE — Interval H&P Note (Signed)
History and Physical Interval Note:  06/09/2019 7:21 AM  [facility]  has presented today for surgery, with the diagnosis of RIGHT CAROTID ARTERY STENOSIS.  The various methods of treatment have been discussed with the patient and family. After consideration of risks, benefits and other options for treatment, the patient has consented to  Procedure(s): ENDARTERECTOMY CAROTID RIGHT (Right) as a surgical intervention.  The patient's history has been reviewed, patient examined, no change in status, stable for surgery.  I have reviewed the patient's chart and labs.  Questions were answered to the patient's satisfaction.     Ruta Hinds

## 2019-06-09 NOTE — Progress Notes (Signed)
Pt complaining of "sand under my eyelid" Right eye red and irriated, visibly swollen peri orbital.  PERRL.  Cold rag providing some relief.  PA notified, in surgery, to see Pt when out.

## 2019-06-09 NOTE — Plan of Care (Signed)
  Problem: Education: Goal: Knowledge of General Education information will improve Description: Including pain rating scale, medication(s)/side effects and non-pharmacologic comfort measures Outcome: Progressing   Problem: Education: Goal: Knowledge of discharge needs will improve Outcome: Progressing

## 2019-06-09 NOTE — Anesthesia Procedure Notes (Signed)
Procedure Name: Intubation Date/Time: 06/09/2019 7:40 AM Performed by: Lance Coon, CRNA Pre-anesthesia Checklist: Patient identified, Emergency Drugs available, Suction available, Patient being monitored and Timeout performed Patient Re-evaluated:Patient Re-evaluated prior to induction Oxygen Delivery Method: Circle system utilized Preoxygenation: Pre-oxygenation with 100% oxygen Induction Type: IV induction Ventilation: Mask ventilation without difficulty Laryngoscope Size: Miller and 3 Grade View: Grade I Tube type: Oral Tube size: 7.0 mm Number of attempts: 1 Airway Equipment and Method: Stylet Placement Confirmation: ETT inserted through vocal cords under direct vision,  positive ETCO2 and breath sounds checked- equal and bilateral Secured at: 21 cm Tube secured with: Tape Dental Injury: Teeth and Oropharynx as per pre-operative assessment

## 2019-06-09 NOTE — Progress Notes (Signed)
Patient examined burning in her right eye has improved significantly since the PACU earlier today.  She does have a mild amount of scleral injection.  There is really no significant edema at this point.  She can have artificial tears if symptoms persist.  Right neck incision no hematoma.  Neurologically otherwise intact.  Ruta Hinds, MD [facility] [place] Office: [phone]

## 2019-06-09 NOTE — Transfer of Care (Signed)
Immediate Anesthesia Transfer of Care Note  Patient: Madison West  Procedure(s) Performed: ENDARTERECTOMY CAROTID RIGHT (Right Neck) Patch Angioplasty of right carotid artery using hemashield paltinum finesse patch (Right Neck)  Patient Location: PACU  Anesthesia Type:General  Level of Consciousness: awake and patient cooperative  Airway & Oxygen Therapy: Patient Spontanous Breathing  Post-op Assessment: Report given to RN and Post -op Vital signs reviewed and stable  Post vital signs: Reviewed and stable  Last Vitals:  Vitals Value Taken Time  BP 105/80 06/09/19 1024  Temp    Pulse 107 06/09/19 1027  Resp 17 06/09/19 1027  SpO2 93 % 06/09/19 1027  Vitals shown include unvalidated device data.  Last Pain:  Vitals:   06/09/19 0620  TempSrc:   PainSc: 0-No pain         Complications: No apparent anesthesia complications

## 2019-06-09 NOTE — Anesthesia Procedure Notes (Signed)
Arterial Line Insertion Start/End12/18/2020 7:38 AM, 06/09/2019 7:46 AM Performed by: Sammie Bench, CRNA, CRNA  Patient location: OR. Preanesthetic checklist: patient identified, IV checked, site marked, risks and benefits discussed, surgical consent, monitors and equipment checked, pre-op evaluation, timeout performed and anesthesia consent Patient sedated Catheter size: 20 G  Attempts: 1 Procedure performed without using ultrasound guided technique. Following insertion, dressing applied and Biopatch. Post procedure assessment: normal  Patient tolerated the procedure well with no immediate complications.

## 2019-06-09 NOTE — Discharge Instructions (Signed)
° °  [facility] ° °Discharge Instructions °  °Carotid Endarterectomy (CEA) ° °Please refer to the following instructions for your post-procedure care. Your surgeon or physician assistant will discuss any changes with you. ° °Activity ° °You are encouraged to walk as much as you can. You can slowly return to normal activities but must avoid strenuous activity and heavy lifting until your doctor tell you it's okay. Avoid activities such as vacuuming or swinging a golf club. You can drive after one week if you are comfortable and you are no longer taking prescription pain medications. It is normal to feel tired for serval weeks after your surgery. It is also normal to have difficulty with sleep habits, eating, and bowel movements after surgery. These will go away with time. ° °Bathing/Showering ° °Shower daily after you go home. Do not soak in a bathtub, hot tub, or swim until the incision heals completely. ° °Incision Care ° °Shower every day. Clean your incision with mild soap and water. Pat the area dry with a clean towel. You do not need a bandage unless otherwise instructed. Do not apply any ointments or creams to your incision. You may have skin glue on your incision. Do not peel it off. It will come off on its own in about one week. Your incision may feel thickened and raised for several weeks after your surgery. This is normal and the skin will soften over time.  ° °For Men Only: It's okay to shave around the incision but do not shave the incision itself for 2 weeks. It is common to have numbness under your chin that could last for several months. ° °Diet ° °Resume your normal diet. There are no special food restrictions following this procedure. A low fat/low cholesterol diet is recommended for all patients with vascular disease. In order to heal from your surgery, it is CRITICAL to get adequate nutrition. Your body requires vitamins, minerals, and protein. Vegetables are the  best source of vitamins and minerals. Vegetables also provide the perfect balance of protein. Processed food has little nutritional value, so try to avoid this. ° °Medications ° °Resume taking all of your medications unless your doctor or physician assistant tells you not to. If your incision is causing pain, you may take over-the- counter pain relievers such as acetaminophen (Tylenol). If you were prescribed a stronger pain medication, please be aware these medications can cause nausea and constipation. Prevent nausea by taking the medication with a snack or meal. Avoid constipation by drinking plenty of fluids and eating foods with a high amount of fiber, such as fruits, vegetables, and grains.  °Do not take Tylenol if you are taking prescription pain medications. ° °Follow Up ° °Our office will schedule a follow up appointment 2-3 weeks following discharge. ° °Please call us immediately for any of the following conditions ° °Increased pain, redness, drainage (pus) from your incision site. °Fever of 101 degrees or higher. °If you should develop stroke (slurred speech, difficulty swallowing, weakness on one side of your body, loss of vision) you should call 911 and go to the nearest emergency room. ° °Reduce your risk of vascular disease: ° °Stop smoking. If you would like help call [facility] at 1-800-QUIT-NOW (1-800-784-8669) or [facility] at 336-586-4000. °Manage your cholesterol °Maintain a desired weight °Control your diabetes °Keep your blood pressure down ° °If you have any questions, please call the office at [phone]. ° °

## 2019-06-09 NOTE — Anesthesia Postprocedure Evaluation (Signed)
Anesthesia Post Note  Patient: Cambell Clopton  Procedure(s) Performed: ENDARTERECTOMY CAROTID RIGHT (Right Neck) Patch Angioplasty of right carotid artery using hemashield paltinum finesse patch (Right Neck)     Patient location during evaluation: PACU Anesthesia Type: General Level of consciousness: awake and alert Pain management: pain level controlled Vital Signs Assessment: post-procedure vital signs reviewed and stable Respiratory status: spontaneous breathing, nonlabored ventilation, respiratory function stable and patient connected to nasal cannula oxygen Cardiovascular status: blood pressure returned to baseline and stable Postop Assessment: no apparent nausea or vomiting Anesthetic complications: no    Last Vitals:  Vitals:   06/09/19 1300 06/09/19 1400  BP: 116/67 117/62  Pulse: 88 89  Resp: (!) 22 19  Temp:    SpO2: 96% 95%    Last Pain:  Vitals:   06/09/19 1144  TempSrc: Oral  PainSc:                  [place]

## 2019-06-09 NOTE — Progress Notes (Signed)
Pt awake alert conversant in PACU Moves all extremities no hematoma  Stable in PACU  Ruta Hinds, MD [facility] [place] Office: [phone]

## 2019-06-10 LAB — CBC
HCT: 42.9 % (ref 36.0–46.0)
Hemoglobin: 14.5 g/dL (ref 12.0–15.0)
MCH: 32.1 pg (ref 26.0–34.0)
MCHC: 33.8 g/dL (ref 30.0–36.0)
MCV: 94.9 fL (ref 80.0–100.0)
Platelets: 422 10*3/uL — ABNORMAL HIGH (ref 150–400)
RBC: 4.52 MIL/uL (ref 3.87–5.11)
RDW: 12.6 % (ref 11.5–15.5)
WBC: 15.3 10*3/uL — ABNORMAL HIGH (ref 4.0–10.5)
nRBC: 0 % (ref 0.0–0.2)

## 2019-06-10 LAB — BASIC METABOLIC PANEL
Anion gap: 10 (ref 5–15)
BUN: 10 mg/dL (ref 8–23)
CO2: 25 mmol/L (ref 22–32)
Calcium: 9.1 mg/dL (ref 8.9–10.3)
Chloride: 102 mmol/L (ref 98–111)
Creatinine, Ser: 0.68 mg/dL (ref 0.44–1.00)
GFR calc Af Amer: >60 mL/min (ref >60)
GFR calc non Af Amer: >60 mL/min (ref >60)
Glucose, Bld: 145 mg/dL — ABNORMAL HIGH (ref 70–99)
Potassium: 3.7 mmol/L (ref 3.5–5.1)
Sodium: 137 mmol/L (ref 135–145)

## 2019-06-10 MED ORDER — HYDROCHLOROTHIAZIDE 12.5 MG PO CAPS: 12.5000 mg | ORAL_CAPSULE | Freq: Every day | ORAL | Status: DC

## 2019-06-10 MED ORDER — OXYCODONE-ACETAMINOPHEN 10-325 MG PO TABS: 1.0000 | ORAL_TABLET | Freq: Four times a day (QID) | ORAL | 0 refills | Status: DC | PRN

## 2019-06-10 MED ORDER — HYDROCHLOROTHIAZIDE 12.5 MG PO CAPS
12.5000 mg | ORAL_CAPSULE | Freq: Every day | ORAL | Status: DC
  Administered 2019-06-11: 12.5 mg via ORAL
  Filled 2019-06-10: qty 1

## 2019-06-10 MED ORDER — LOSARTAN POTASSIUM 25 MG PO TABS
25.0000 mg | ORAL_TABLET | Freq: Every day | ORAL | Status: DC
  Administered 2019-06-11: 25 mg via ORAL
  Filled 2019-06-10: qty 1

## 2019-06-10 MED ORDER — LOSARTAN POTASSIUM 25 MG PO TABS: 25.0000 mg | ORAL_TABLET | Freq: Every day | ORAL | Status: DC

## 2019-06-10 NOTE — Progress Notes (Signed)
Patient with questions regarding why her heart monitor ringing and asked what is a heart block explained to patient basic physiology about the heart block rhythm and as to what happens. Reassured patient regarding her current vital signs are and have been stable so far during this hospitalization with a heart block. Day shift RN Juliann Pulse gave patient printed information regarding heart blocks. Patients states she will look over it and call if she has any questions.

## 2019-06-10 NOTE — Progress Notes (Addendum)
[facility]   Subjective  - No new complaints in regards to right neck incision.  Heart monitor alarmed all night and she no rest.    Objective 107/72 76 97.8 F (36.6 C) (Oral) 12 97%  Intake/Output Summary (Last 24 hours) at 06/10/2019 0833 Last data filed at 06/09/2019 2305 Gross per 24 hour  Intake 1840 ml  Output 2150 ml  Net -310 ml    Right neck incision healing well without hematoma, + mild ecchymosis.   No tongue deviation, smile symmetric without facial droop. Grip 5/5 B UE and palpable radial pulse right UE Heart first degree block ECG without change stable   Assessment/Planning: POD # 1 right CEA  Plan for discharge home today in stable condition F/U with DR. Fields in 2-3 weeks.  Roxy Horseman 06/10/2019 8:33 AM --  Laboratory Lab Results: Recent Labs    06/10/19 0519  WBC 15.3*  HGB 14.5  HCT 42.9  PLT 422*   BMET Recent Labs    06/10/19 0519  NA 137  K 3.7  CL 102  CO2 25  GLUCOSE 145*  BUN 10  CREATININE 0.68  CALCIUM 9.1    COAG Lab Results  Component Value Date   INR 0.9 06/06/2019   No results found for: PTT  I agree with the above.  I have seen and evaluated the patient.  She is postoperative day #1 status post right carotid endarterectomy.  She had some episodes of bradycardia which were asymptomatic overnight which she is very anxious about.  An EKG was performed that shows first-degree heart block which is chronic.  I spoke with Dr. Einar Gip via telephone.  No additional intervention is necessary.  I reassured the patient.  We will continue to monitor her overnight with anticipation of discharge tomorrow  Annamarie Major

## 2019-06-10 NOTE — Plan of Care (Signed)
  Problem: Education: Goal: Knowledge of General Education information will improve Description: Including pain rating scale, medication(s)/side effects and non-pharmacologic comfort measures Outcome: Progressing   Problem: Education: Goal: Knowledge of discharge needs will improve Outcome: Progressing

## 2019-06-11 NOTE — Progress Notes (Addendum)
[facility] [place]  Subjective  - Worried about everything.   Objective (!) 141/89 67 97.8 F (36.6 C) (Oral) 18 96% No intake or output data in the 24 hours ending 06/11/19 0804  Right neck incision healing well without hematoma, + mild ecchymosis.   No tongue deviation, smile symmetric without facial droop. Grip 5/5 B UE and palpable radial pulse right UE Lungs non labored breathing Heart 1st degree block without change  Assessment/Planning: POD #2 Right CEA  Plan for discharge today in stable condition No neurologic deficits post op. F/U in 2-3 weeks with Dr. Oneida Alar.  Roxy Horseman 06/11/2019 8:04 AM --  Laboratory Lab Results: Recent Labs    06/10/19 0519  WBC 15.3*  HGB 14.5  HCT 42.9  PLT 422*   BMET Recent Labs    06/10/19 0519  NA 137  K 3.7  CL 102  CO2 25  GLUCOSE 145*  BUN 10  CREATININE 0.68  CALCIUM 9.1    COAG Lab Results  Component Value Date   INR 0.9 06/06/2019   No results found for: PTT  I agree with the above.  Patient is stable for discharge home today  Annamarie Major

## 2019-06-12 ENCOUNTER — Encounter: Payer: Self-pay | Admitting: *Deleted

## 2019-06-13 NOTE — Discharge Summary (Signed)
[facility] Discharge Summary   Patient ID:  Madison West MRN: [id number] DOB/AGE: 65/18/1957 65 y.o.  Admit date: 06/09/2019 Discharge date: 06/11/19 Date of Surgery: 06/09/2019 Surgeon: Surgeon(s): Elam Dutch, MD  Admission Diagnosis: Asymptomatic stenosis of right carotid artery without infarction [I65.21]  Discharge Diagnoses:  Asymptomatic stenosis of right carotid artery without infarction [I65.21]  Secondary Diagnoses: Past Medical History:  Diagnosis Date  . Anxiety   . Arthritis    low back and hip pain intermittent  . Breast cancer ([place]) 06/02/07   r breast -surgery ,radiaology. chemotherapy  . Carpal tunnel syndrome    right hand  . Colon polyps   . Complication of anesthesia    Fentanyl, Versed-makes extra hyper, bradycardia x 1 in PACU, [facility] (08/15/11 cardiology felt neostigmine may have resulted in AV nodal block)   . Coronary artery disease   . Depression    denies  . Dysplasia of vulva   . Hypertension   . Palpitations    PSVT, s/p adenosine 08/04/16  . S/P breast lumpectomy 07/04/07   R breast  . S/P radiation therapy 2009    Procedure(s): ENDARTERECTOMY CAROTID RIGHT Patch Angioplasty of right carotid artery using hemashield paltinum finesse patch  Discharged Condition: stable  HPI: Madison West is a 63 y.o. female, with known prior mild to moderate carotid stenosis being followed by Dr. Einar Gip with surveillance.  Patient was recently noted to have progressed to greater than 80% stenosis.  After repeated the patient's carotid duplex exam today which again shows greater than 80% stenosis with velocities over 400 no significant left side stenosis antegrade vertebral flow.  She was scheduled for right CEA.   Hospital Course:  Madison West is a 63 y.o. female is S/P  Procedure(s): ENDARTERECTOMY CAROTID RIGHT Patch Angioplasty of right carotid artery using hemashield paltinum finesse patch Post op she  had bradycardia over night.  ECG repeated showed Heart first degree block ECG without change stable.  This was reviewed by her Cardiologist and she was discharged post op day 2 in stable condition.  F/U with DR. Fields in 2-3 weeks.     Significant Diagnostic Studies: CBC Lab Results  Component Value Date   WBC 15.3 (H) 06/10/2019   HGB 14.5 06/10/2019   HCT 42.9 06/10/2019   MCV 94.9 06/10/2019   PLT 422 (H) 06/10/2019    BMET    Component Value Date/Time   NA 137 06/10/2019 0519   NA 140 10/15/2017 1624   NA 139 05/17/2013 1345   K 3.7 06/10/2019 0519   K 3.7 05/17/2013 1345   CL 102 06/10/2019 0519   CL 101 07/11/2012 1139   CO2 25 06/10/2019 0519   CO2 23 05/17/2013 1345   GLUCOSE 145 (H) 06/10/2019 0519   GLUCOSE 122 05/17/2013 1345   GLUCOSE 96 07/11/2012 1139   BUN 10 06/10/2019 0519   BUN 11 10/15/2017 1624   BUN 9.1 05/17/2013 1345   CREATININE 0.68 06/10/2019 0519   CREATININE 0.75 01/17/2016 1440   CREATININE 0.8 05/17/2013 1345   CALCIUM 9.1 06/10/2019 0519   CALCIUM 9.6 05/17/2013 1345   GFRNONAA >60 06/10/2019 0519   GFRNONAA 86 01/17/2016 1440   GFRAA >60 06/10/2019 0519   GFRAA >89 01/17/2016 1440   COAG Lab Results  Component Value Date   INR 0.9 06/06/2019     Disposition:  Discharge to :Home Discharge Instructions    Call MD for:  redness, tenderness, or signs of infection (  pain, swelling, bleeding, redness, odor or green/yellow discharge around incision site)   Complete by: As directed    Call MD for:  redness, tenderness, or signs of infection (pain, swelling, bleeding, redness, odor or green/yellow discharge around incision site)   Complete by: As directed    Call MD for:  severe or increased pain, loss or decreased feeling  in affected limb(s)   Complete by: As directed    Call MD for:  severe or increased pain, loss or decreased feeling  in affected limb(s)   Complete by: As directed    Call MD for:  temperature >100.5   Complete  by: As directed    Call MD for:  temperature >100.5   Complete by: As directed    Discharge instructions   Complete by: As directed    You may shower daily as needed.  Slowly return to normal daily activities.   Resume previous diet   Complete by: As directed    Resume previous diet   Complete by: As directed      Allergies as of 06/11/2019      Reactions   Prednisone Other (See Comments)   Fentanyl Other (See Comments)   MAKES SUPER HYPER PER PT   Midazolam Anxiety   Vicodin [hydrocodone-acetaminophen] Anxiety   Extreme anxiety.      Medication List    TAKE these medications   alprazolam 2 MG tablet Commonly known as: XANAX TAKE 1/2 TO 1 (ONE-HALF TO ONE) TABLET BY MOUTH THREE TIMES DAILY AS NEEDED FOR ANXIETY What changed: See the new instructions. Notes to patient: Take as needed 06/11/2019   aspirin 81 MG chewable tablet Chew 81 mg by mouth daily. Notes to patient: Take tomorrow AM 06/12/2019   atorvastatin 10 MG tablet Commonly known as: LIPITOR Take 1 tablet (10 mg total) by mouth daily. Notes to patient: Take this evening 06/11/2019   cyclobenzaprine 5 MG tablet Commonly known as: FLEXERIL Take 1-2 tablets (5-10 mg total) by mouth 3 (three) times daily as needed for muscle spasms. Notes to patient: Take as needed 06/11/2019   losartan-hydrochlorothiazide 50-12.5 MG tablet Commonly known as: HYZAAR Take 1 tablet by mouth every morning. What changed:   how much to take  when to take this Notes to patient: Take tomorrow AM 06/12/2019   oxyCODONE-acetaminophen 10-325 MG tablet Commonly known as: PERCOCET Take 1 tablet by mouth every 6 (six) hours as needed for pain. To take one tablet once or twice daily as needed for back and hip pain. What changed: when to take this Notes to patient: Take as needed 06/11/2019      Verbal and written Discharge instructions given to the patient. Wound care per Discharge AVS Follow-up Information    Elam Dutch, MD Follow up in 3 week(s).   Specialties: Vascular Surgery, Cardiology Why: office will call Contact information: [address] [place] [address] [phone]          --- For [facility] use --- Instructions: Press F2 to tab through selections.  Delete question if not applicable.   Modified Rankin score at D/C (0-6): Rankin Score=0  IV medication needed for:  1. Hypertension: No 2. Hypotension: No  Post-op Complications: No  1. Post-op CVA or TIA: No  If yes: Event classification (right eye, left eye, right cortical, left cortical, verterobasilar, other):   If yes: Timing of event (intra-op, <6 hrs post-op, >=6 hrs post-op, unknown):   2. CN injury: No  If yes: CN  injuried   3. Myocardial infarction: No  If yes: Dx by (EKG or clinical, Troponin):   4.  CHF: No  5.  Dysrhythmia (new): Bradycardia  6. Wound infection: No  7. Reperfusion symptoms: No  8. Return to OR: No  If yes: return to OR for (bleeding, neurologic, other CEA incision, other):   Discharge medications: Statin use:  Yes ASA use:  Yes Beta blocker use:  No  for medical reason   ACE-Inhibitor use:  No  for medical reason   P2Y12 Antagonist use: [x ] None, [ ]  Plavix, [ ]  Plasugrel, [ ]  Ticlopinine, [ ]  Ticagrelor, [ ]  Other, [ ]  No for medical reason, [ ]  Non-compliant, [ ]  Not-indicated Anti-coagulant use:  [x ] None, [ ]  Warfarin, [ ]  Rivaroxaban, [ ]  Dabigatran, [ ]  Other, [ ]  No for medical reason, [ ]  Non-compliant, [ ]  Not-indicated Signed: Roxy Horseman 06/13/2019, 8:40 AM

## 2019-06-17 ENCOUNTER — Other Ambulatory Visit: Payer: Self-pay | Admitting: Family Medicine

## 2019-06-17 DIAGNOSIS — F411 Generalized anxiety disorder: Secondary | ICD-10-CM

## 2019-06-17 NOTE — Telephone Encounter (Signed)
Forwarding medication refill request to the clinical pool for review. 

## 2019-06-19 NOTE — Telephone Encounter (Signed)
Patient is requesting a refill of the following medications: Requested Prescriptions   Pending Prescriptions Disp Refills   alprazolam (XANAX) 2 MG tablet [Pharmacy Med Name: ALPRAZolam 2 MG Oral Tablet] 90 tablet 0    Sig: TAKE 1/2 TO 1 (ONE-HALF TO ONE) TABLET BY MOUTH THREE TIMES DAILY AS NEEDED FOR ANXIETY    Date of patient request: 06/17/19 Last office visit: 05/30/2018 Date of last refill: 05/09/19 Last refill amount: 90 Follow up time period per chart: none

## 2019-06-19 NOTE — Telephone Encounter (Signed)
Recent surgery noted. Will refill with office visit to be scheduled in next 2 weeks. Routed to scheduling. Controlled substance database (PDMP) reviewed.

## 2019-06-20 ENCOUNTER — Ambulatory Visit (INDEPENDENT_AMBULATORY_CARE_PROVIDER_SITE_OTHER): Payer: Self-pay | Admitting: Physician Assistant

## 2019-06-20 ENCOUNTER — Other Ambulatory Visit: Payer: Self-pay

## 2019-06-20 ENCOUNTER — Encounter: Payer: Self-pay | Admitting: *Deleted

## 2019-06-20 VITALS — BP 152/92 | HR 91 | Temp 97.5°F | Resp 20 | Ht 69.0 in | Wt 195.0 lb

## 2019-06-20 DIAGNOSIS — I6521 Occlusion and stenosis of right carotid artery: Secondary | ICD-10-CM

## 2019-06-20 NOTE — Progress Notes (Signed)
  POST OPERATIVE OFFICE NOTE    CC:  F/u for surgery  HPI:  This is a 63 y.o. female who is s/p right carotid endarterectomy Dacron patch angioplasty on June 09, 2019 for asymptomatic carotid artery stenosis. She comes in today concerned about swelling of her incision. Complaining of headache described as "in the crown" of her head.  She denies any vision disturbances, extremity weakness, slurred speech.   Allergies  Allergen Reactions  . Prednisone Other (See Comments)  . Fentanyl Other (See Comments)    MAKES SUPER HYPER PER PT  . Midazolam Anxiety  . Vicodin [Hydrocodone-Acetaminophen] Anxiety    Extreme anxiety.    Current Outpatient Medications  Medication Sig Dispense Refill  . alprazolam (XANAX) 2 MG tablet TAKE 1/2 TO 1 (ONE-HALF TO ONE) TABLET BY MOUTH THREE TIMES DAILY AS NEEDED FOR ANXIETY 90 tablet 0  . aspirin 81 MG chewable tablet Chew 81 mg by mouth daily.     Marland Kitchen atorvastatin (LIPITOR) 10 MG tablet Take 1 tablet (10 mg total) by mouth daily. (Patient not taking: Reported on 06/01/2019) 30 tablet 2  . cyclobenzaprine (FLEXERIL) 5 MG tablet Take 1-2 tablets (5-10 mg total) by mouth 3 (three) times daily as needed for muscle spasms. (Patient not taking: Reported on 06/02/2019) 30 tablet 0  . losartan-hydrochlorothiazide (HYZAAR) 50-12.5 MG tablet Take 1 tablet by mouth every morning. (Patient taking differently: Take 0.5 tablets by mouth daily. ) 90 tablet 2  . oxyCODONE-acetaminophen (PERCOCET) 10-325 MG tablet Take 1 tablet by mouth every 6 (six) hours as needed for pain. To take one tablet once or twice daily as needed for back and hip pain. 10 tablet 0   No current facility-administered medications for this visit.     ROS:  See HPI  Physical Exam:  Vitals:   06/20/19 1520  Weight: 195 lb (88.5 kg)  Height: 5\' 9"  (1.753 m)    Incision:  Some ecchymosis and mild edema. Well approximated with Dermabond intact. Soft to palpation Extremities:  Normal motor  function Neuro: A and O x 4. Tongue midline. Face is symmetrical  Assessment/Plan:  This is a 63 y.o. female who is s/p: right CEA.  We discussed the healing nature of neck incision. Advised to not work/lift heavy objects until seen in follow-up on 1/6.   - Risa Grill, PA-C [facility] [phone]  Clinic MD:  Carlis Abbott

## 2019-06-28 ENCOUNTER — Other Ambulatory Visit: Payer: Self-pay

## 2019-06-28 ENCOUNTER — Encounter: Payer: Self-pay | Admitting: Vascular Surgery

## 2019-06-28 ENCOUNTER — Ambulatory Visit (INDEPENDENT_AMBULATORY_CARE_PROVIDER_SITE_OTHER): Payer: Self-pay | Admitting: Vascular Surgery

## 2019-06-28 VITALS — BP 164/86 | HR 83 | Temp 98.0°F | Resp 18 | Ht 69.0 in | Wt 195.2 lb

## 2019-06-28 DIAGNOSIS — I6521 Occlusion and stenosis of right carotid artery: Secondary | ICD-10-CM

## 2019-06-28 NOTE — Progress Notes (Signed)
Patient is a 64 year old female who returns for postoperative follow-up today after recent right carotid endarterectomy.  She had some bradycardia in the hospital but has not had any problems since returning home.  She has no symptoms of TIA amaurosis or stroke.  She has no incisional drainage from her neck.  She has no swallowing difficulties.  She is taking aspirin.  She refuses to take statins.  Physical exam:  Vitals:   06/28/19 1024  BP: (!) 164/86  Pulse: 83  Resp: 18  Temp: 98 F (36.7 C)  TempSrc: Temporal  SpO2: 96%  Weight: 195 lb 3.2 oz (88.5 kg)  Height: 5\' 9"  (1.753 m)    Neck: Well-healed right neck incision no drainage or erythema  Neuro: Symmetric upper extremity lower extremity motor strength 5/5 and symmetric no facial asymmetry  Assessment: Doing well status post right carotid endarterectomy.  Patient's most recent carotid duplex did not show any significant contralateral disease.  Plan: The patient will continue to take her aspirin.  She will continue to think about whether or not she wishes to start a statin but currently is refusing this.  She will have a follow-up carotid duplex scan in 6 months time and be seen in our APP clinic.  Ruta Hinds, MD [facility] [place] Office: [phone]

## 2019-06-29 ENCOUNTER — Encounter: Payer: Medicare Other | Admitting: Vascular Surgery

## 2019-07-03 ENCOUNTER — Telehealth: Payer: Self-pay

## 2019-07-03 ENCOUNTER — Other Ambulatory Visit: Payer: Self-pay | Admitting: *Deleted

## 2019-07-03 ENCOUNTER — Telehealth (INDEPENDENT_AMBULATORY_CARE_PROVIDER_SITE_OTHER): Payer: Medicare Other | Admitting: Family Medicine

## 2019-07-03 ENCOUNTER — Other Ambulatory Visit: Payer: Self-pay

## 2019-07-03 DIAGNOSIS — M545 Low back pain, unspecified: Secondary | ICD-10-CM

## 2019-07-03 DIAGNOSIS — I6529 Occlusion and stenosis of unspecified carotid artery: Secondary | ICD-10-CM

## 2019-07-03 DIAGNOSIS — G8929 Other chronic pain: Secondary | ICD-10-CM

## 2019-07-03 DIAGNOSIS — I6521 Occlusion and stenosis of right carotid artery: Secondary | ICD-10-CM

## 2019-07-03 DIAGNOSIS — F411 Generalized anxiety disorder: Secondary | ICD-10-CM | POA: Diagnosis not present

## 2019-07-03 MED ORDER — CYCLOBENZAPRINE HCL 5 MG PO TABS: 5.0000 mg | ORAL_TABLET | Freq: Three times a day (TID) | ORAL | 0 refills | Status: DC | PRN

## 2019-07-03 MED ORDER — ALPRAZOLAM 2 MG PO TABS: ORAL_TABLET | ORAL | 0 refills | Status: DC

## 2019-07-03 NOTE — Progress Notes (Signed)
Virtual Visit via Telephone Note  I connected with Madison West on 07/04/19 at 1:07 PM by telephone and verified that I am speaking with the correct person using two identifiers.   I discussed the limitations, risks, security and privacy concerns of performing an evaluation and management service by telephone and the availability of in person appointments. I also discussed with the patient that there may be a patient responsible charge related to this service. The patient expressed understanding and agreed to proceed, consent obtained  Chief complaint: Med review, discuss xanax/anxiety.   Chief Complaint  Patient presents with   Anxiety    Patient stated stated Dr Carlota Raspberry wanted to discuss xanax medication.Patient stated she found a psychiatry but unable to get into see her till Jul 28, 2019. Need refill on meds. Patient still upset she didnt have her appt last week like she was supposed to. Patient unable to take vital at this time. GAD7=4     History of Present Illness: Madison West is a 64 y.o. female  Anxiety: Longstanding generalized anxiety disorder, has been managed with alprazolam 2 mg 1/2 pill in the morning, 1/2 pill in the afternoon, 3/4-1 pill in the evening when last discussed in December 2019.  SSRIs have been discussed for some time, and did try fluoxetine for 1 to 2 days but then had tachycardia.  Ultimately decided on psychiatry evaluation to evaluate for other treatment options and ongoing care of anxiety.  3 months prescription for Xanax initially prescribed in December 2019 to allow time to be seen by psychiatry.  Prescriptions have been extended given COVID-19 pandemic, difficulty with obtaining visits with psychiatry.  See previous telephone notes.  Additionally she is had some other health issues including treatment for vulvar cancer and most recently carotid endarterectomy for progressive right carotid artery stenosis in mid December.  She is followed by  cardiology, Dr. Einar Gip with history of PSVT.  September 23 office visit reviewed. 30-day event monitor after ER visit in July 2019 with frequent PACs, atrial bigeminy and rare PVCs that were symptomatic with palpitations.   Has tried to 12 different psychiatrists, still trying to find other providers that may take her insurance. Has appt with Dr. Toy Care soon, but that will be out of pocket. appt in February. Possibly has psychiatrist if referred by [facility] provider?   Saw psychologist in past when had breast cancer. No recent visit with therapist. Dr. Kinnie Scales.   Has weaned self off xanax many years ago, off for 10 years in past, but had to restart with all of her life stressors. Xanax has allowed her to maintain and live her life.  Still taking 1/2-3/4 pill in am, 1/2 pill in afternoon, then 3/4-1 pill in evening. No more than 3 in a day. Usually under 3 per day.  Rare extra 0.5mg  dose when daughter's horse died.  Extra stressors with covid 19 pandemic and her health issues.  Denies other prescriber.  No marijuana or other illicit drug use.  Rare alcohol - 0-3 in a week.   Has taken flexeril for back spasms as needed, out past month. Takes one if flare after using back more,lifting and understands not to combine with other sedating meds.      Patient Active Problem List   Diagnosis Date Noted   Asymptomatic stenosis of right carotid artery without infarction 06/09/2019   CRPS (complex regional pain syndrome), upper limb 10/19/2012   History of DVT (deep vein thrombosis) 10/05/2011   Hypertension  10/04/2011   Depression 10/04/2011   Personal history of vulvar dysplasia 08/15/2011   Grade 3 vulvar intraepithelial neoplasia 06/30/2011   Past Medical History:  Diagnosis Date   Anxiety    Arthritis    low back and hip pain intermittent   Breast cancer ([place]) 06/02/07   r breast -surgery ,radiaology. chemotherapy   Carpal tunnel syndrome    right hand   Colon polyps     Complication of anesthesia    Fentanyl, Versed-makes extra hyper, bradycardia x 1 in PACU, [facility] (08/15/11 cardiology felt neostigmine may have resulted in AV nodal block)    Coronary artery disease    Depression    denies   Dysplasia of vulva    Hypertension    Palpitations    PSVT, s/p adenosine 08/04/16   S/P breast lumpectomy 07/04/07   R breast   S/P radiation therapy 2009   Past Surgical History:  Procedure Laterality Date   APPENDECTOMY     age 63   CESAREAN SECTION     x 2   COLONOSCOPY W/ POLYPECTOMY     COLONOSCOPY WITH PROPOFOL N/A 04/27/2016   Procedure: COLONOSCOPY WITH PROPOFOL;  Surgeon: Garlan Fair, MD;  Location: [facility] ENDOSCOPY;  Service: Endoscopy;  Laterality: N/A;   DILATION AND CURETTAGE OF UTERUS     multiple   ENDARTERECTOMY Right 06/09/2019   ENDARTERECTOMY Right 06/09/2019   Procedure: ENDARTERECTOMY CAROTID RIGHT;  Surgeon: Elam Dutch, MD;  Location: [facility] OR;  Service: Vascular;  Laterality: Right;   INTRAUTERINE DEVICE INSERTION     IUD REMOVAL     MASTECTOMY PARTIAL / LUMPECTOMY W/ AXILLARY LYMPHADENECTOMY Right    lumpectomy and lymph nodes removed   PATCH ANGIOPLASTY Right 06/09/2019   Procedure: Patch Angioplasty of right carotid artery using hemashield paltinum finesse patch;  Surgeon: Elam Dutch, MD;  Location: [facility] OR;  Service: Vascular;  Laterality: Right;   TUBAL LIGATION     VULVA SURGERY     Multiple times for dysplasia   Allergies  Allergen Reactions   Prednisone Other (See Comments)   Fentanyl Other (See Comments)    MAKES SUPER HYPER PER PT   Midazolam Anxiety   Vicodin [Hydrocodone-Acetaminophen] Anxiety    Extreme anxiety.   Prior to Admission medications   Medication Sig Start Date End Date Taking? Authorizing Provider  alprazolam (XANAX) 2 MG tablet TAKE 1/2 TO 1 (ONE-HALF TO ONE) TABLET BY MOUTH THREE TIMES DAILY AS NEEDED FOR ANXIETY 06/19/19  Yes [name], MD  aspirin 81 MG  chewable tablet Chew 81 mg by mouth daily.    Yes [provider]  cyclobenzaprine (FLEXERIL) 5 MG tablet Take 1-2 tablets (5-10 mg total) by mouth 3 (three) times daily as needed for muscle spasms. 10/15/17  Yes [name], MD  losartan-hydrochlorothiazide (HYZAAR) 50-12.5 MG tablet Take 1 tablet by mouth every morning. Patient taking differently: Take 0.5 tablets by mouth daily.  11/11/18  Yes Adrian Prows, MD  oxyCODONE-acetaminophen (PERCOCET) 10-325 MG tablet Take 1 tablet by mouth every 6 (six) hours as needed for pain. To take one tablet once or twice daily as needed for back and hip pain. 06/10/19  Yes Ulyses Amor, PA-C   Social History   Socioeconomic History   Marital status: Widowed    Spouse name: Not on file   Number of children: 2   Years of education: Not on file   Highest education level: Not on file  Occupational History  Occupation: Metallurgist: Arboriculturist  Tobacco Use   Smoking status: Former Smoker    Packs/day: 0.75    Years: 20.00    Pack years: 15.00    Types: Cigarettes    Quit date: 05/06/1997    Years since quitting: 22.1   Smokeless tobacco: Never Used  Substance and Sexual Activity   Alcohol use: Yes    Alcohol/week: 0.0 standard drinks    Comment: occ   Drug use: No   Sexual activity: Not Currently  Other Topics Concern   Not on file  Social History Narrative   Not on file   Social Determinants of Health   Financial Resource Strain:    Difficulty of Paying Living Expenses: Not on file  Food Insecurity:    Worried About [place] in the Last Year: Not on file   Ran Out of Food in the Last Year: Not on file  Transportation Needs:    Lack of Transportation (Medical): Not on file   Lack of Transportation (Non-Medical): Not on file  Physical Activity:    Days of Exercise per Week: Not on file   Minutes of Exercise per Session: Not on file  Stress:    Feeling of Stress : Not on file    Social Connections:    Frequency of Communication with Friends and Family: Not on file   Frequency of Social Gatherings with Friends and Family: Not on file   Attends Religious Services: Not on file   Active Member of Clubs or Organizations: Not on file   Attends Archivist Meetings: Not on file   Marital Status: Not on file  Intimate Partner Violence:    Fear of Current or Ex-Partner: Not on file   Emotionally Abused: Not on file   Physically Abused: Not on file   Sexually Abused: Not on file     Observations/Objective: No distress, appropriate responses throughout visit.  Depression screen [facility] 2/9 07/03/2019 02/07/2019 05/24/2018 10/15/2017 04/06/2017  Decreased Interest 1 0 0 0 0  Down, Depressed, Hopeless 1 0 0 0 0  PHQ - 2 Score 2 0 0 0 0  Altered sleeping 0 - - - -  Tired, decreased energy 1 - - - -  Change in appetite 0 - - - -  Feeling bad or failure about yourself  0 - - - -  Trouble concentrating 0 - - - -  Moving slowly or fidgety/restless 0 - - - -  Suicidal thoughts 0 - - - -  PHQ-9 Score 3 - - - -  Some recent data might be hidden      10 min chart review,  Assessment and Plan: Generalized anxiety disorder - Plan: alprazolam (XANAX) 2 MG tablet  -Intolerant to previous SSRI trial.  Does agree with eventual taper of alprazolam, but with her recent health issues and pandemic do not feel this is appropriate time.  Would also like her to meet with psychiatry, and planned appointment soon.  Can refer to other provider if more cost effective.  Agreed to continue alprazolam at this time with plan for tapering dose in the next 3 months.  In office evaluation in 3 months.  Potential risks of medication were discussed including avoidance of, with other sedating medicines or narcotic pain medications.  Chronic low back pain - Plan: cyclobenzaprine (FLEXERIL) 5 MG tablet Left-sided low back pain without sciatica, unspecified chronicity - Plan:  cyclobenzaprine (FLEXERIL) 5 MG tablet  -Intermittent flares  of chronic low back pain, usually improves with rare use of Flexeril.  Aware of side effects and risks, not to combine with other sedating medicines.  Short-term refill provided.  Recheck in office in 3 months  Follow Up Instructions: 3 months in office   Patient Instructions    Keep appointment with Dr. Toy Care as planned to discuss other possible treatments. If another psychiatrist is a better fit for insurance coverage - let me know and I can refer you to that provider. Will continue xanax for now with plan to start tapering in the next few months. Follow up with me in 3 months. Please return for in office visit in 3 months.    I do recommend meeting with therapist. If you are unable to meet with Dr. Ferdinand Lango, here are a few other options:  Here are a few options for counseling:  [facility]:  [place] [phone]  Xanax prescription as well as Flexeril were sent to [facility]. if you require Flexeril more than once every few weeks, you should schedule an office visit discuss other treatment for your symptoms.  Additionally do not combine Flexeril with other sedating medications such as Xanax.  Return to the clinic or go to the nearest emergency room if any of your symptoms worsen or new symptoms occur.   If you have lab work done today you will be contacted with your lab results within the next 2 weeks.  If you have not heard from [place] then please contact us. The fastest way to get your results is to register for My Chart.   IF you received an x-ray today, you will receive an invoice from [facility]. Please contact [facility] at [phone] with questions or concerns regarding your invoice.   IF you received labwork today, you will receive an invoice from [place]. Please contact [facility] at [phone] with questions or concerns regarding your  invoice.   Our billing staff will not be able to assist you with questions regarding bills from these companies.  You will be contacted with the lab results as soon as they are available. The fastest way to get your results is to activate your My Chart account. Instructions are located on the last page of this paperwork. If you have not heard from [place] regarding the results in 2 weeks, please contact this office.        I discussed the assessment and treatment plan with the patient. The patient was provided an opportunity to ask questions and all were answered. The patient agreed with the plan and demonstrated an understanding of the instructions.   The patient was advised to call back or seek an in-person evaluation if the symptoms worsen or if the condition fails to improve as anticipated.  I provided 30  minutes of non-face-to-face time during this encounter, and 10 mins chart review.   Signed,   Merri Ray, MD [facility] [place].  07/04/19

## 2019-07-03 NOTE — Telephone Encounter (Signed)
On behalf of Dr. Carlota Raspberry, contacted pt re how her [place] receives prescriptions.  Per pt, Xanax can be faxed but must include SSN.  Pt provided SSN and read back.  Faxed to [facility] at [phone].  Received confirmation of successful  fax. Discarded paper prescription.

## 2019-07-03 NOTE — Patient Instructions (Addendum)
  Keep appointment with Dr. Toy Care as planned to discuss other possible treatments. If another psychiatrist is a better fit for insurance coverage - let me know and I can refer you to that provider. Will continue xanax for now with plan to start tapering in the next few months. Follow up with me in 3 months. Please return for in office visit in 3 months.    I do recommend meeting with therapist. If you are unable to meet with Dr. Ferdinand Lango, here are a few other options:  Here are a few options for counseling:  [facility]:  [place] [phone]  Xanax prescription as well as Flexeril were sent to [facility]. if you require Flexeril more than once every few weeks, you should schedule an office visit discuss other treatment for your symptoms.  Additionally do not combine Flexeril with other sedating medications such as Xanax.  Return to the clinic or go to the nearest emergency room if any of your symptoms worsen or new symptoms occur.   If you have lab work done today you will be contacted with your lab results within the next 2 weeks.  If you have not heard from [place] then please contact us. The fastest way to get your results is to register for My Chart.   IF you received an x-ray today, you will receive an invoice from [facility]. Please contact [facility] at [phone] with questions or concerns regarding your invoice.   IF you received labwork today, you will receive an invoice from [place]. Please contact [facility] at [phone] with questions or concerns regarding your invoice.   Our billing staff will not be able to assist you with questions regarding bills from these companies.  You will be contacted with the lab results as soon as they are available. The fastest way to get your results is to activate your My Chart account. Instructions are located on the last page of this paperwork. If you have not heard  from [place] regarding the results in 2 weeks, please contact this office.

## 2019-07-05 ENCOUNTER — Ambulatory Visit: Payer: Medicare Other | Admitting: Cardiology

## 2019-07-12 ENCOUNTER — Other Ambulatory Visit: Payer: Self-pay

## 2019-07-12 ENCOUNTER — Ambulatory Visit (INDEPENDENT_AMBULATORY_CARE_PROVIDER_SITE_OTHER): Payer: Medicare Other | Admitting: Cardiology

## 2019-07-12 ENCOUNTER — Encounter: Payer: Self-pay | Admitting: Cardiology

## 2019-07-12 VITALS — BP 122/80 | HR 90 | Temp 98.4°F | Ht 69.0 in | Wt 199.3 lb

## 2019-07-12 DIAGNOSIS — E782 Mixed hyperlipidemia: Secondary | ICD-10-CM

## 2019-07-12 DIAGNOSIS — I10 Essential (primary) hypertension: Secondary | ICD-10-CM | POA: Diagnosis not present

## 2019-07-12 DIAGNOSIS — I6521 Occlusion and stenosis of right carotid artery: Secondary | ICD-10-CM

## 2019-07-12 DIAGNOSIS — Z9889 Other specified postprocedural states: Secondary | ICD-10-CM

## 2019-07-12 NOTE — Progress Notes (Signed)
Primary Physician/Referring:  [name], MD  Patient ID: Madison West, female    DOB: 1956/03/29, 65 y.o.   MRN: [id number]  Chief Complaint  Patient presents with  . Hyperlipidemia  . Hypertension  . Follow-up    HPI: Madison West  is a 64 y.o. female  history of PSVT on 08/04/2016, terminated with adenosine, anxiety and depression, history of breast cancer status post radiation therapy and chemotherapy in 2009, hypertension, mild aortic valve stenosis, hyperlipidemia, palpitations, asymptomatic right carotid artery stenosis SP right carotid endarterectomy on 06/09/2019 presents here for follow-up.  She did have episode of PSVT while in the hospital postoperatively but also had asymptomatic bradycardia, she was reassured and discharged home.  She now presents for follow-up.  She is hesitant to taking any medications including statins.  She has extreme anxiety, chronic palpitations.  States that she is doing well, she had questions about marked bradycardia she had after surgery.  Past Medical History:  Diagnosis Date  . Anxiety   . Arthritis    low back and hip pain intermittent  . Breast cancer ([place]) 06/02/07   r breast -surgery ,radiaology. chemotherapy  . Carpal tunnel syndrome    right hand  . Colon polyps   . Complication of anesthesia    Fentanyl, Versed-makes extra hyper, bradycardia x 1 in PACU, [facility] (08/15/11 cardiology felt neostigmine may have resulted in AV nodal block)   . Coronary artery disease   . Depression    denies  . Dysplasia of vulva   . Hypertension   . Palpitations    PSVT, s/p adenosine 08/04/16  . S/P breast lumpectomy 07/04/07   R breast  . S/P radiation therapy 2009    Past Surgical History:  Procedure Laterality Date  . APPENDECTOMY     age 89  . CESAREAN SECTION     x 2  . COLONOSCOPY W/ POLYPECTOMY    . COLONOSCOPY WITH PROPOFOL N/A 04/27/2016   Procedure: COLONOSCOPY WITH PROPOFOL;  Surgeon: Garlan Fair, MD;   Location: [facility];  Service: Endoscopy;  Laterality: N/A;  . DILATION AND CURETTAGE OF UTERUS     multiple  . ENDARTERECTOMY Right 06/09/2019  . ENDARTERECTOMY Right 06/09/2019   Procedure: ENDARTERECTOMY CAROTID RIGHT;  Surgeon: Elam Dutch, MD;  Location: [facility];  Service: Vascular;  Laterality: Right;  . INTRAUTERINE DEVICE INSERTION    . IUD REMOVAL    . MASTECTOMY PARTIAL / LUMPECTOMY W/ AXILLARY LYMPHADENECTOMY Right    lumpectomy and lymph nodes removed  . PATCH ANGIOPLASTY Right 06/09/2019   Procedure: Patch Angioplasty of right carotid artery using hemashield paltinum finesse patch;  Surgeon: Elam Dutch, MD;  Location: [facility] OR;  Service: Vascular;  Laterality: Right;  . TUBAL LIGATION    . VULVA SURGERY     Multiple times for dysplasia    Social History   Socioeconomic History  . Marital status: Widowed    Spouse name: Not on file  . Number of children: 2  . Years of education: Not on file  . Highest education level: Not on file  Occupational History  . Occupation: Metallurgist: Arboriculturist  Tobacco Use  . Smoking status: Former Smoker    Packs/day: 0.75    Years: 20.00    Pack years: 15.00    Types: Cigarettes    Quit date: 05/06/1997    Years since quitting: 22.1  . Smokeless tobacco: Never Used  Substance and Sexual Activity  .  Alcohol use: Yes    Alcohol/week: 0.0 standard drinks    Comment: occ  . Drug use: No  . Sexual activity: Not Currently  Other Topics Concern  . Not on file  Social History Narrative  . Not on file   Social Determinants of Health   Financial Resource Strain:   . Difficulty of Paying Living Expenses: Not on file  Food Insecurity:   . Worried About Charity fundraiser in the Last Year: Not on file  . Ran Out of Food in the Last Year: Not on file  Transportation Needs:   . Lack of Transportation (Medical): Not on file  . Lack of Transportation (Non-Medical): Not on file  Physical Activity:   . Days  of Exercise per Week: Not on file  . Minutes of Exercise per Session: Not on file  Stress:   . Feeling of Stress : Not on file  Social Connections:   . Frequency of Communication with Friends and Family: Not on file  . Frequency of Social Gatherings with Friends and Family: Not on file  . Attends Religious Services: Not on file  . Active Member of Clubs or Organizations: Not on file  . Attends Archivist Meetings: Not on file  . Marital Status: Not on file  Intimate Partner Violence:   . Fear of Current or Ex-Partner: Not on file  . Emotionally Abused: Not on file  . Physically Abused: Not on file  . Sexually Abused: Not on file    Current Outpatient Medications on File Prior to Visit  Medication Sig Dispense Refill  . alprazolam (XANAX) 2 MG tablet 1/2-1 tid prn anxiety. 270 tablet 0  . aspirin 81 MG chewable tablet Chew 81 mg by mouth daily.     . cyclobenzaprine (FLEXERIL) 5 MG tablet Take 1-2 tablets (5-10 mg total) by mouth 3 (three) times daily as needed for muscle spasms. 30 tablet 0  . losartan-hydrochlorothiazide (HYZAAR) 50-12.5 MG tablet Take 1 tablet by mouth every morning. (Patient taking differently: Take 0.5 tablets by mouth daily. ) 90 tablet 2  . oxyCODONE-acetaminophen (PERCOCET) 10-325 MG tablet Take 1 tablet by mouth every 6 (six) hours as needed for pain. To take one tablet once or twice daily as needed for back and hip pain. 10 tablet 0   No current facility-administered medications on file prior to visit.   Review of Systems  Constitution: Negative for decreased appetite, malaise/fatigue, weight gain and weight loss.  Eyes: Negative for visual disturbance.  Cardiovascular: Positive for dyspnea on exertion and palpitations (stable). Negative for chest pain, claudication, leg swelling, orthopnea and syncope.  Respiratory: Negative for hemoptysis and wheezing.   Endocrine: Negative for cold intolerance and heat intolerance.  Hematologic/Lymphatic: Does  not bruise/bleed easily.  Skin: Negative for nail changes.  Musculoskeletal: Negative for muscle weakness and myalgias.  Gastrointestinal: Negative for abdominal pain, change in bowel habit, nausea and vomiting.  Neurological: Negative for difficulty with concentration, dizziness, focal weakness and headaches.  Psychiatric/Behavioral: Positive for depression. Negative for altered mental status and suicidal ideas. The patient is nervous/anxious.   All other systems reviewed and are negative.  Objective  Blood pressure 122/80, pulse 90, temperature 98.4 F (36.9 C), height 5\' 9"  (1.753 m), weight 199 lb 4.8 oz (90.4 kg), SpO2 93 %. Body mass index is 29.43 kg/m.   Vitals with BMI 07/12/2019 07/03/2019 06/28/2019  Height 5\' 9"  5\' 9"  5\' 9"   Weight 199 lbs 5 oz 195 lbs 195 lbs 3  oz  BMI 29.42 123456 [id number]  Systolic [id number] - 123456  Diastolic 80 - 86  Pulse 90 - 83    Physical Exam  Constitutional: She is oriented to person, place, and time. Vital signs are normal. She appears well-developed and well-nourished.  Mildly obese  HENT:  Head: Normocephalic and atraumatic.  Neck:  Right carotid endarterectomy scar  Cardiovascular: Normal rate, regular rhythm and intact distal pulses.  Murmur heard.  Midsystolic murmur is present with a grade of 2/6 at the upper right sternal border. Pulses:      Carotid pulses are on the right side with bruit and on the left side with bruit. Right CEA scar well  Healed. No JVD, no leg edema.   Pulmonary/Chest: Effort normal and breath sounds normal. No accessory muscle usage. No respiratory distress.  Abdominal: Soft. Bowel sounds are normal.  Musculoskeletal:        General: Normal range of motion.  Neurological: She is alert and oriented to person, place, and time.  Vitals reviewed.  Laboratory examination:   CMP Latest Ref Rng & Units 06/10/2019 06/06/2019 01/11/2019  Glucose 70 - 99 mg/dL 145(H) 111(H) 106(H)  BUN 8 - 23 mg/dL 10 10 12   Creatinine 0.44 -  1.00 mg/dL 0.68 0.70 0.72  Sodium 135 - 145 mmol/L 137 138 137  Potassium 3.5 - 5.1 mmol/L 3.7 3.9 3.9  Chloride 98 - 111 mmol/L 102 100 98  CO2 22 - 32 mmol/L 25 28 27   Calcium 8.9 - 10.3 mg/dL 9.1 9.6 9.5  Total Protein 6.5 - 8.1 g/dL - 7.7 7.2  Total Bilirubin 0.3 - 1.2 mg/dL - 0.6 0.7  Alkaline Phos 38 - 126 U/L - 56 62  AST 15 - 41 U/L - 21 18  ALT 0 - 44 U/L - 20 17   CBC Latest Ref Rng & Units 06/10/2019 06/06/2019 01/11/2019  WBC 4.0 - 10.5 K/uL 15.3(H) 7.7 8.9  Hemoglobin 12.0 - 15.0 g/dL 14.5 16.0(H) 15.5(H)  Hematocrit 36.0 - 46.0 % 42.9 48.6(H) 46.4(H)  Platelets 150 - 400 K/uL 422(H) 429(H) 396   Lipid Panel     Component Value Date/Time   CHOL 255 (H) 10/15/2017 1624   TRIG 167 (H) 10/15/2017 1624   HDL 44 10/15/2017 1624   CHOLHDL 5.8 (H) 10/15/2017 1624   CHOLHDL 6.7 12/17/2011 0949   VLDL 48 (H) 12/17/2011 0949   LDLCALC 178 (H) 10/15/2017 1624   HEMOGLOBIN A1C Lab Results  Component Value Date   HGBA1C 5.8 (H) 10/15/2017   MPG 114 10/06/2011   TSH No results for input(s): TSH in the last 8760 hours. Cardiac Studies:   Exercise sestamibi stress test 04/04/2018: 1. The resting electrocardiogram demonstrated normal sinus rhythm, LAD, LAFB, incomplete RBBB, poor R progression and no resting arrhythmias. The stress electrocardiogram was normal. Patient exercised on Bruce protocol for 4:55 minutes and achieved 6.95 METS. Stress test terminated due to chest pressure and 87% MPHR achieved (Target HR >85%). Resting BP 150/88 and pea BP 218/102 mm hg. 2. Myocardial perfusion imaging is normal. Overall left ventricular systolic function was normal without regional wall motion abnormalities. The left ventricular ejection fraction was 72%. This is a low risk study. Chest pain could be related to hypertensive heart disease. Clinical correlation recommended.  Event monitor 30 days 03/11/2018: Episodic transmission 03/20/2018 1:30 pm: 5 beat NSVT.One brief atrial tach @  142/min. Frequent PAC in bigeminal pattern, symptomatic. Rare PVC.  Echocardiogram 03/08/2018: Left ventricle cavity is normal in size. Moderate  concentric hypertrophy of the left ventricle. Normal global wall motion. Doppler evidence of grade I (impaired) diastolic dysfunction, elevated LAP. Calculated EF 60%. Mild aortic valve leaflet calcification. Severely restricted aortic valve leaflets. Mild aortic valve stenosis. Trace aortic regurgitation. Aortic valve peak pressure gradient of 23 and mean gradient of 13 mmHg, calculated aortic valve area 1.59 cm. In 2-D, the valve appears to be severely restricted. Compared to the echocardiogram 09/09/2016, mean gradient has increased from 13 mmHg. Otherwise no significant change. Consider TEE if clinically indicated.  Carotid artery duplex  05/09/2019: Critical stenosis in the right internal carotid artery (80-99%). The right PSV internal/common carotid artery ratio of 5.19 is consistent with a stenosis of >70%. Peak velocity 321/160 cm/s. Minimal stenosis in the left internal carotid artery (minimal). Antegrade right vertebral artery flow. Antegrade left vertebral artery flow. Compared to the study done on 11/04/2018, right ICA stenosis stenosis severity has increased from less than 70%.  Assessment     ICD-10-CM   1. Asymptomatic stenosis of right carotid artery  I65.21   2. History of right-sided carotid endarterectomy 06/09/2019  Z98.890   3. Mixed hyperlipidemia  E78.2   4. Essential hypertension  I10     EKG 01/26/2018: Normal sinus rhythm at 89 bpm with first-degree AV block and borderline right atrial enlargement. Normal interval, no evidence of ischemia. No significant change compared to EKG March 2018.  Recommendations:   Madison West  is a 64 y.o.  female  with history of PSVT on 08/04/2016, terminated with adenosine, anxiety and depression, history of breast cancer status post radiation therapy and chemotherapy in 2009, hypertension,  mild aortic valve stenosis, hyperlipidemia, palpitations, asymptomatic right carotid artery stenosis SP right carotid endarterectomy on 06/09/2019 presents here for follow-up.  She did have episode of PSVT while in the hospital postoperatively but also had asymptomatic bradycardia, she was reassured and discharged home.  She now presents for follow-up.  She is hesitant to taking any medications including statins.  She is still hesitant to take any statins, she again had questions about aortic atherosclerosis, carotid atherosclerosis, advised her that secondary prevention in that case would be most appropriate even now with use of statins.  Advised her to try statins at least once a week.  I have also explained to her regarding bradycardia that she had after carotid surgery that it is quite common due to carotid hypersensitivity syndrome. She also had bried SVT and has not had any recurrence of significant palpitations.  With regard to hypertension, blood pressure is well controlled on losartan HCT.  I will see her back in 6 months or sooner if problems.  Adrian Prows, MD, [facility] 07/12/2019, 3:55 PM [facility]

## 2019-09-04 DIAGNOSIS — Z86010 Personal history of colonic polyps: Secondary | ICD-10-CM | POA: Diagnosis not present

## 2019-09-04 DIAGNOSIS — Z800 Family history of malignant neoplasm of digestive organs: Secondary | ICD-10-CM | POA: Diagnosis not present

## 2019-09-04 DIAGNOSIS — R194 Change in bowel habit: Secondary | ICD-10-CM | POA: Diagnosis not present

## 2019-09-13 ENCOUNTER — Ambulatory Visit: Payer: Medicare Other | Admitting: Cardiology

## 2019-09-21 DIAGNOSIS — Z124 Encounter for screening for malignant neoplasm of cervix: Secondary | ICD-10-CM | POA: Diagnosis not present

## 2019-09-21 DIAGNOSIS — R87612 Low grade squamous intraepithelial lesion on cytologic smear of cervix (LGSIL): Secondary | ICD-10-CM | POA: Diagnosis not present

## 2019-09-21 DIAGNOSIS — C519 Malignant neoplasm of vulva, unspecified: Secondary | ICD-10-CM | POA: Diagnosis not present

## 2019-10-04 ENCOUNTER — Telehealth: Payer: Self-pay | Admitting: Family Medicine

## 2019-10-04 ENCOUNTER — Encounter: Payer: Self-pay | Admitting: Family Medicine

## 2019-10-04 ENCOUNTER — Other Ambulatory Visit: Payer: Self-pay

## 2019-10-04 ENCOUNTER — Ambulatory Visit (INDEPENDENT_AMBULATORY_CARE_PROVIDER_SITE_OTHER): Payer: Medicare Other | Admitting: Family Medicine

## 2019-10-04 VITALS — BP 137/82 | HR 85 | Temp 98.2°F | Ht 68.0 in | Wt 201.0 lb

## 2019-10-04 DIAGNOSIS — I6521 Occlusion and stenosis of right carotid artery: Secondary | ICD-10-CM

## 2019-10-04 DIAGNOSIS — M85832 Other specified disorders of bone density and structure, left forearm: Secondary | ICD-10-CM | POA: Diagnosis not present

## 2019-10-04 DIAGNOSIS — I10 Essential (primary) hypertension: Secondary | ICD-10-CM

## 2019-10-04 DIAGNOSIS — F411 Generalized anxiety disorder: Secondary | ICD-10-CM

## 2019-10-04 DIAGNOSIS — Z780 Asymptomatic menopausal state: Secondary | ICD-10-CM | POA: Diagnosis not present

## 2019-10-04 MED ORDER — LOSARTAN POTASSIUM-HCTZ 50-12.5 MG PO TABS: 0.5000 | ORAL_TABLET | Freq: Every day | ORAL | 1 refills | Status: DC

## 2019-10-04 NOTE — Progress Notes (Signed)
Subjective:  Patient ID: Madison West, female    DOB: August 10, 1955  Age: 64 y.o. MRN: [id number]  CC:  Chief Complaint  Patient presents with  . Anxiety    Pt states her anxiety has gone up since she was sent to see psyciety. pt states she dosn't think its helping. pt dosn't like it. pt states she can't take a whole BP pill because she is scared her BP will drop to low.    HPI Madison West presents for   Generalized anxiety disorder. See prior notes.  Has been on longstanding benzodiazepine, 2 mg tablet, one half in the morning half in the afternoon 3/4 to 1 pill in the evening.  We discussed the need for psychiatric follow-up in the past, ultimately reducing the psychiatry at telemedicine visit January 11.  Certainly has had some other health issues contributing anxiety with vulvar cancer, carotid endarterectomy for progressive carotid artery stenosis, history of PSVT followed by cardiology, other stress with pandemic.  Plan follow-up with Dr. Toy Care during telemedicine visit, and recommend she follow-up with her previous therapist.  She was taking 1/2 to 3/4 pill in the morning, half in the afternoon, three quarters to 1 in the evening but no more than 3/day. Will not take SSRI.   Had virtual visit with Dr. Toy Care few weeks ago. She had concerns about dose of Xanax. Not manic. Plans on writing xanax, sent to [facility], but discussed a taper.  She feels like she takes minimal to function.  No new meds. Plan to possible add in librium but has not yet prescribed yet. Has not met with Dr. Ferdinand Lango - concerned about time.    GAD 7 : Generalized Anxiety Score 10/04/2019  Nervous, Anxious, on Edge 3  Control/stop worrying 3  Worry too much - different things 3  Trouble relaxing 2  Restless 0  Easily annoyed or irritable 1  Afraid - awful might happen 0  Total GAD 7 Score 12   Depression screen [facility] 2/9 10/04/2019 07/03/2019 02/07/2019 05/24/2018 10/15/2017  Decreased Interest 0 1 0 0 0    Down, Depressed, Hopeless 0 1 0 0 0  PHQ - 2 Score 0 2 0 0 0  Altered sleeping - 0 - - -  Tired, decreased energy - 1 - - -  Change in appetite - 0 - - -  Feeling bad or failure about yourself  - 0 - - -  Trouble concentrating - 0 - - -  Moving slowly or fidgety/restless - 0 - - -  Suicidal thoughts - 0 - - -  PHQ-9 Score - 3 - - -  Some recent data might be hidden    Hypertension: Taking 1/2 of losartan hct 50/12.66m qd.  Doing ok on 1/2 pill per day.  Drops it too much on full pill. Cardiology appt in June - Dr. GEinar Gip  BP Readings from Last 3 Encounters:  10/04/19 137/82  07/12/19 122/80  06/28/19 (!) 164/86   Lab Results  Component Value Date   CREATININE 0.68 06/10/2019    30 mins false to face and chart review - greater than 50% counseling.    History Patient Active Problem List   Diagnosis Date Noted  . Asymptomatic stenosis of right carotid artery without infarction 06/09/2019  . CRPS (complex regional pain syndrome), upper limb 10/19/2012  . History of DVT (deep vein thrombosis) 10/05/2011  . Hypertension 10/04/2011  . Depression 10/04/2011  . Personal history of vulvar dysplasia 08/15/2011  .  Grade 3 vulvar intraepithelial neoplasia 06/30/2011   Past Medical History:  Diagnosis Date  . Anxiety   . Arthritis    low back and hip pain intermittent  . Breast cancer ([place]) 06/02/07   r breast -surgery ,radiaology. chemotherapy  . Carpal tunnel syndrome    right hand  . Colon polyps   . Complication of anesthesia    Fentanyl, Versed-makes extra hyper, bradycardia x 1 in PACU, [facility] (08/15/11 cardiology felt neostigmine may have resulted in AV nodal block)   . Coronary artery disease   . Depression    denies  . Dysplasia of vulva   . Hypertension   . Palpitations    PSVT, s/p adenosine 08/04/16  . S/P breast lumpectomy 07/04/07   R breast  . S/P radiation therapy 2009   Past Surgical History:  Procedure Laterality Date  . APPENDECTOMY     age 54  .  CESAREAN SECTION     x 2  . COLONOSCOPY W/ POLYPECTOMY    . COLONOSCOPY WITH PROPOFOL N/A 04/27/2016   Procedure: COLONOSCOPY WITH PROPOFOL;  Surgeon: Garlan Fair, MD;  Location: [facility];  Service: Endoscopy;  Laterality: N/A;  . DILATION AND CURETTAGE OF UTERUS     multiple  . ENDARTERECTOMY Right 06/09/2019  . ENDARTERECTOMY Right 06/09/2019   Procedure: ENDARTERECTOMY CAROTID RIGHT;  Surgeon: Elam Dutch, MD;  Location: [facility];  Service: Vascular;  Laterality: Right;  . INTRAUTERINE DEVICE INSERTION    . IUD REMOVAL    . MASTECTOMY PARTIAL / LUMPECTOMY W/ AXILLARY LYMPHADENECTOMY Right    lumpectomy and lymph nodes removed  . PATCH ANGIOPLASTY Right 06/09/2019   Procedure: Patch Angioplasty of right carotid artery using hemashield paltinum finesse patch;  Surgeon: Elam Dutch, MD;  Location[facility] OR;  Service: Vascular;  Laterality: Right;  . TUBAL LIGATION    . VULVA SURGERY     Multiple times for dysplasia   Allergies  Allergen Reactions  . Prednisone Other (See Comments)  . Fentanyl Other (See Comments)    MAKES SUPER HYPER PER PT  . Midazolam Anxiety  . Vicodin [Hydrocodone-Acetaminophen] Anxiety    Extreme anxiety.   Prior to Admission medications   Medication Sig Start Date End Date Taking? Authorizing Provider  alprazolam Duanne Moron) 2 MG tablet 1/2-1 tid prn anxiety. 07/03/19  Yes [name], MD  aspirin 81 MG chewable tablet Chew 81 mg by mouth daily.    Yes [provider]  cyclobenzaprine (FLEXERIL) 5 MG tablet Take 1-2 tablets (5-10 mg total) by mouth 3 (three) times daily as needed for muscle spasms. 07/03/19  Yes [name], MD  losartan-hydrochlorothiazide (HYZAAR) 50-12.5 MG tablet Take 1 tablet by mouth every morning. Patient taking differently: Take 0.5 tablets by mouth daily.  11/11/18  Yes Adrian Prows, MD  oxyCODONE-acetaminophen (PERCOCET) 10-325 MG tablet Take 1 tablet by mouth every 6 (six) hours as needed for pain. To  take one tablet once or twice daily as needed for back and hip pain. Patient not taking: Reported on 10/04/2019 06/10/19   Ulyses Amor, PA-C   Social History   Socioeconomic History  . Marital status: Widowed    Spouse name: Not on file  . Number of children: 2  . Years of education: Not on file  . Highest education level: Not on file  Occupational History  . Occupation: Metallurgist: Arboriculturist  Tobacco Use  . Smoking status: Former Smoker    Packs/day:  0.75    Years: 20.00    Pack years: 15.00    Types: Cigarettes    Quit date: 05/06/1997    Years since quitting: 22.4  . Smokeless tobacco: Never Used  Substance and Sexual Activity  . Alcohol use: Yes    Alcohol/week: 0.0 standard drinks    Comment: occ  . Drug use: No  . Sexual activity: Not Currently  Other Topics Concern  . Not on file  Social History Narrative  . Not on file   Social Determinants of Health   Financial Resource Strain:   . Difficulty of Paying Living Expenses:   Food Insecurity:   . Worried About Charity fundraiser in the Last Year:   . Arboriculturist in the Last Year:   Transportation Needs:   . Film/video editor (Medical):   Marland Kitchen Lack of Transportation (Non-Medical):   Physical Activity:   . Days of Exercise per Week:   . Minutes of Exercise per Session:   Stress:   . Feeling of Stress :   Social Connections:   . Frequency of Communication with Friends and Family:   . Frequency of Social Gatherings with Friends and Family:   . Attends Religious Services:   . Active Member of Clubs or Organizations:   . Attends Archivist Meetings:   Marland Kitchen Marital Status:   Intimate Partner Violence:   . Fear of Current or Ex-Partner:   . Emotionally Abused:   Marland Kitchen Physically Abused:   . Sexually Abused:     Review of Systems Per HPI.   Objective:   Vitals:   10/04/19 1436  BP: 137/82  Pulse: 85  Temp: 98.2 F (36.8 C)  TempSrc: Temporal  SpO2: 98%  Weight: 201 lb  (91.2 kg)  Height: 5' 8" (1.727 m)     Physical Exam Vitals reviewed.  Constitutional:      General: She is not in acute distress.    Appearance: She is well-developed.  HENT:     Head: Normocephalic and atraumatic.  Eyes:     Conjunctiva/sclera: Conjunctivae normal.     Pupils: Pupils are equal, round, and reactive to light.  Neck:     Vascular: No carotid bruit.  Cardiovascular:     Rate and Rhythm: Normal rate and regular rhythm.     Heart sounds: Normal heart sounds.  Pulmonary:     Effort: Pulmonary effort is normal.     Breath sounds: Normal breath sounds.  Abdominal:     Palpations: Abdomen is soft. There is no pulsatile mass.     Tenderness: There is no abdominal tenderness.  Skin:    General: Skin is warm and dry.  Neurological:     Mental Status: She is alert and oriented to person, place, and time.  Psychiatric:        Attention and Perception: Attention and perception normal.        Mood and Affect: Mood is anxious.        Behavior: Behavior normal. Behavior is cooperative.        Thought Content: Thought content does not include homicidal or suicidal ideation.    Assessment & Plan:  Madison West is a 64 y.o. female . Generalized anxiety disorder  -Persistent.  Suspect worsening with pandemic and other situational stressors.  Now followed by psychiatry.  Reportedly may be adding Librium, but continued on Xanax for now.  Recommended she continue follow-up with psychiatry for plan, counseling likely will  also be helpful as recommended previously.  Phone numbers again provided if needed.  Essential hypertension - Plan: losartan-hydrochlorothiazide (HYZAAR) 50-12.5 MG tablet  -Stable on half dosing, continue same.  Follow-up with cardiology as planned.  Meds ordered this encounter  Medications  . losartan-hydrochlorothiazide (HYZAAR) 50-12.5 MG tablet    Sig: Take 0.5 tablets by mouth daily.    Dispense:  45 tablet    Refill:  1   Patient  Instructions    I do recommend continuing to meet with psychiatry regarding treatment and other options. If possible, I do recommend meeting with your prior therapist as that may also be helpful in treatment of your anxiety. Follow up with Dr Ferdinand Lango if possible, or here are other options.   Here are a few options for counseling:  [facility]:  [place] [phone]   If you have lab work done today you will be contacted with your lab results within the next 2 weeks.  If you have not heard from [place] then please contact us. The fastest way to get your results is to register for My Chart.   IF you received an x-ray today, you will receive an invoice from [facility]. Please contact [facility] at [phone] with questions or concerns regarding your invoice.   IF you received labwork today, you will receive an invoice from [facility]. Please contact [facility] at [phone] with questions or concerns regarding your invoice.   Our billing staff will not be able to assist you with questions regarding bills from these companies.  You will be contacted with the lab results as soon as they are available. The fastest way to get your results is to activate your My Chart account. Instructions are located on the last page of this paperwork. If you have not heard from [place] regarding the results in 2 weeks, please contact this office.         Signed, Merri Ray, MD [facility]

## 2019-10-04 NOTE — Telephone Encounter (Signed)
Madison West called and is needing a verbal for her referral she has an appt today 4/14. They stated they have sent in a fax and they never received a answer. Please advise. [phone]

## 2019-10-04 NOTE — Patient Instructions (Addendum)
  I do recommend continuing to meet with psychiatry regarding treatment and other options. If possible, I do recommend meeting with your prior therapist as that may also be helpful in treatment of your anxiety. Follow up with Dr Ferdinand Lango if possible, or here are other options.   Here are a few options for counseling:  [facility]:  [place] [phone]   If you have lab work done today you will be contacted with your lab results within the next 2 weeks.  If you have not heard from [place] then please contact us. The fastest way to get your results is to register for My Chart.   IF you received an x-ray today, you will receive an invoice from [facility]. Please contact [facility] at [phone] with questions or concerns regarding your invoice.   IF you received labwork today, you will receive an invoice from [facility]. Please contact [facility] at [phone] with questions or concerns regarding your invoice.   Our billing staff will not be able to assist you with questions regarding bills from these companies.  You will be contacted with the lab results as soon as they are available. The fastest way to get your results is to activate your My Chart account. Instructions are located on the last page of this paperwork. If you have not heard from [place] regarding the results in 2 weeks, please contact this office.

## 2019-10-04 NOTE — Telephone Encounter (Signed)
Paper order was Faxed and a verbal order was given to [place].

## 2019-10-05 LAB — HM DEXA SCAN

## 2019-10-11 ENCOUNTER — Other Ambulatory Visit: Payer: Self-pay

## 2019-10-11 ENCOUNTER — Telehealth: Payer: Self-pay

## 2019-10-11 ENCOUNTER — Ambulatory Visit (INDEPENDENT_AMBULATORY_CARE_PROVIDER_SITE_OTHER): Payer: Medicare Other | Admitting: Physician Assistant

## 2019-10-11 VITALS — BP 164/98 | HR 84 | Temp 97.8°F | Resp 16 | Ht 68.0 in | Wt 200.9 lb

## 2019-10-11 DIAGNOSIS — I6521 Occlusion and stenosis of right carotid artery: Secondary | ICD-10-CM | POA: Diagnosis not present

## 2019-10-11 NOTE — Telephone Encounter (Signed)
Pt called triage line with c/o of sudden onset of pain under R CEA incision scar. She also noticed a "bulging vein" in this area that is prominent and she has not noticed it prior. I have offered her an appt this week. PA is going to call her, as pt is concerned to wait to be seen until later this week.

## 2019-10-11 NOTE — Progress Notes (Signed)
Office Note     CC:  follow up Requesting Provider:  [name], MD  HPI: Madison West is a 64 y.o. (16-Jun-1956) female who presents due to concerns for neck swelling, pain, and " bulging neck vein" and bruising along carotid incision. States she was at work yesterday and just felt some discomfort in her neck region. She went home and felt like she could " see the big vein in my neck bulging". She says that she has been told she has a lot of calcifications in her body so she is concerned it is due to this. She  Says she woke up this morning and felt like there was more swelling and a numb feeling in her neck along incision. States that her daughters told her it looked swollen as well. She expresses that she is very anxious and nervous that she has a new blockage in her neck. She denies any trauma to the area. She denies doing any different activity or straining. She did not feel any pop, tearing sensation or pulling sensation. She denies any trouble speaking, swallowing, rotating her neck, no headache, dizziness, changes in vision, chest pain or shortness of breath  The pt not on a statin for cholesterol management.  The pt is on a daily aspirin.   Other AC: none The pt  for hypertension.   The pt is not diabetic Tobacco hx: former, quit 1998  Past Medical History:  Diagnosis Date  . Anxiety   . Arthritis    low back and hip pain intermittent  . Breast cancer ([place]) 06/02/07   r breast -surgery ,radiaology. chemotherapy  . Carpal tunnel syndrome    right hand  . Colon polyps   . Complication of anesthesia    Fentanyl, Versed-makes extra hyper, bradycardia x 1 in PACU, [facility] (08/15/11 cardiology felt neostigmine may have resulted in AV nodal block)   . Coronary artery disease   . Depression    denies  . Dysplasia of vulva   . Hypertension   . Palpitations    PSVT, s/p adenosine 08/04/16  . S/P breast lumpectomy 07/04/07   R breast  . S/P radiation therapy 2009    Past  Surgical History:  Procedure Laterality Date  . APPENDECTOMY     age 31  . CESAREAN SECTION     x 2  . COLONOSCOPY W/ POLYPECTOMY    . COLONOSCOPY WITH PROPOFOL N/A 04/27/2016   Procedure: COLONOSCOPY WITH PROPOFOL;  Surgeon: Garlan Fair, MD;  Location: [facility];  Service: Endoscopy;  Laterality: N/A;  . DILATION AND CURETTAGE OF UTERUS     multiple  . ENDARTERECTOMY Right 06/09/2019  . ENDARTERECTOMY Right 06/09/2019   Procedure: ENDARTERECTOMY CAROTID RIGHT;  Surgeon: Elam Dutch, MD;  Location: [facility];  Service: Vascular;  Laterality: Right;  . INTRAUTERINE DEVICE INSERTION    . IUD REMOVAL    . MASTECTOMY PARTIAL / LUMPECTOMY W/ AXILLARY LYMPHADENECTOMY Right    lumpectomy and lymph nodes removed  . PATCH ANGIOPLASTY Right 06/09/2019   Procedure: Patch Angioplasty of right carotid artery using hemashield paltinum finesse patch;  Surgeon: Elam Dutch, MD;  Location: [facility] OR;  Service: Vascular;  Laterality: Right;  . TUBAL LIGATION    . VULVA SURGERY     Multiple times for dysplasia    Social History   Socioeconomic History  . Marital status: Widowed    Spouse name: Not on file  . Number of children: 2  . Years  of education: Not on file  . Highest education level: Not on file  Occupational History  . Occupation: Metallurgist: Arboriculturist  Tobacco Use  . Smoking status: Former Smoker    Packs/day: 0.75    Years: 20.00    Pack years: 15.00    Types: Cigarettes    Quit date: 05/06/1997    Years since quitting: 22.4  . Smokeless tobacco: Never Used  Substance and Sexual Activity  . Alcohol use: Yes    Alcohol/week: 0.0 standard drinks    Comment: occ  . Drug use: No  . Sexual activity: Not Currently  Other Topics Concern  . Not on file  Social History Narrative  . Not on file   Social Determinants of Health   Financial Resource Strain:   . Difficulty of Paying Living Expenses:   Food Insecurity:   . Worried About Sales executive in the Last Year:   . Arboriculturist in the Last Year:   Transportation Needs:   . Film/video editor (Medical):   Marland Kitchen Lack of Transportation (Non-Medical):   Physical Activity:   . Days of Exercise per Week:   . Minutes of Exercise per Session:   Stress:   . Feeling of Stress :   Social Connections:   . Frequency of Communication with Friends and Family:   . Frequency of Social Gatherings with Friends and Family:   . Attends Religious Services:   . Active Member of Clubs or Organizations:   . Attends Archivist Meetings:   Marland Kitchen Marital Status:   Intimate Partner Violence:   . Fear of Current or Ex-Partner:   . Emotionally Abused:   Marland Kitchen Physically Abused:   . Sexually Abused:     Family History  Problem Relation Age of Onset  . Colon cancer Mother 25  . Heart disease Father        heart attack  . Colon polyps Brother        one brother had large colon polyp that needed surgery to be removed  . Heart disease Paternal Grandfather     Current Outpatient Medications  Medication Sig Dispense Refill  . alprazolam (XANAX) 2 MG tablet 1/2-1 tid prn anxiety. 270 tablet 0  . aspirin 81 MG chewable tablet Chew 81 mg by mouth daily.     . cyclobenzaprine (FLEXERIL) 5 MG tablet Take 1-2 tablets (5-10 mg total) by mouth 3 (three) times daily as needed for muscle spasms. 30 tablet 0  . losartan-hydrochlorothiazide (HYZAAR) 50-12.5 MG tablet Take 0.5 tablets by mouth daily. 45 tablet 1   No current facility-administered medications for this visit.    Allergies  Allergen Reactions  . Prednisone Other (See Comments)  . Fentanyl Other (See Comments)    MAKES SUPER HYPER PER PT  . Midazolam Anxiety  . Vicodin [Hydrocodone-Acetaminophen] Anxiety    Extreme anxiety.     REVIEW OF SYSTEMS:  [X]  denotes positive finding, [ ]  denotes negative finding Cardiac  Comments:  Chest pain or chest pressure:    Shortness of breath upon exertion:    Short of breath when lying  flat:    Irregular heart rhythm:        Vascular    Pain in calf, thigh, or hip brought on by ambulation:    Pain in feet at night that wakes you up from your sleep:     Blood clot in your veins:    Leg swelling:  Pulmonary    Oxygen at home:    Productive cough:     Wheezing:         Neurologic    Sudden weakness in arms or legs:     Sudden numbness in arms or legs:     Sudden onset of difficulty speaking or slurred speech:    Temporary loss of vision in one eye:     Problems with dizziness:         Gastrointestinal    Blood in stool:     Vomited blood:         Genitourinary    Burning when urinating:     Blood in urine:        Psychiatric    Major depression:         Hematologic    Bleeding problems:    Problems with blood clotting too easily:        Skin    Rashes or ulcers:        Constitutional    Fever or chills:      PHYSICAL EXAMINATION:  Vitals:   10/11/19 1502  BP: (!) 164/98  Pulse: 84  Resp: 16  Temp: 97.8 F (36.6 C)  SpO2: 97%  Weight: 200 lb 14.4 oz (91.1 kg)  Height: 5\' 8"  (1.727 m)    General:  WDWN in NAD; vital signs documented above Gait: Normal HENT: WNL, normocephalic Pulmonary: normal non-labored breathing , without Rales, rhonchi,  wheezing Cardiac: regular HR, without  Murmurs without carotid bruit. Right neck incision healed s/p CEA. No swelling along incision. She have very minimal fullness in right supraclavicular region and overlying sternocleidomastoid. Non tender to palpation. She says it has a diminished sensation in this region. No ecchymosis or erythema. No diminished range of motion of neck Abdomen: soft, NT, no masses Skin: without rashes Vascular Exam/Pulses: 2+ radial pulses bilaterally, bilateral lower extremities well perfused and warm. 2+ DP pulses bilaterally Extremities: without ischemic changes, without Gangrene , without cellulitis; without open wounds;  Musculoskeletal: no muscle wasting or  atrophy  Neurologic: A&O X 3;  No focal weakness or paresthesias are detected Psychiatric:  The pt has Normal affect. Very anxious   ASSESSMENT/PLAN:: 64 y.o. female here for follow up for her carotid stenosis. She is very anxious at presentation. Blood pressure is elevated. Reassurance provided to patient that she does not have any recurrent right carotid stenosis and no critical findings on exam worrisome for any emergent issues. She does not have any TIA/stroke like symptoms. She likely has a strained muscle in her neck. I recommend warm compresses and Ibuprofen as needed. I have scheduled her for a carotid duplex a littler sooner than her 6 month post op to ease her mind about her carotid disease. - she will continue her Aspirin - she continues to refuse to take statin medications but she is trying to improve her dietary intake of cholesterol and take OTC supplements for her cholesterol - I advised her to follow up sooner if she has increased discomfort, swelling, numbness, or any signs or symptoms of stroke/tia - She will have follow up with Carotid duplex in 1 month    Karoline Caldwell, PA-C [facility] [phone]  Clinic MD:  Dr. Oneida Alar

## 2019-10-12 ENCOUNTER — Other Ambulatory Visit: Payer: Self-pay | Admitting: *Deleted

## 2019-10-12 DIAGNOSIS — I6529 Occlusion and stenosis of unspecified carotid artery: Secondary | ICD-10-CM

## 2019-10-13 DIAGNOSIS — Z1159 Encounter for screening for other viral diseases: Secondary | ICD-10-CM | POA: Diagnosis not present

## 2019-10-18 DIAGNOSIS — D122 Benign neoplasm of ascending colon: Secondary | ICD-10-CM | POA: Diagnosis not present

## 2019-10-18 DIAGNOSIS — K5730 Diverticulosis of large intestine without perforation or abscess without bleeding: Secondary | ICD-10-CM | POA: Diagnosis not present

## 2019-10-18 DIAGNOSIS — D125 Benign neoplasm of sigmoid colon: Secondary | ICD-10-CM | POA: Diagnosis not present

## 2019-10-18 DIAGNOSIS — Z86010 Personal history of colonic polyps: Secondary | ICD-10-CM | POA: Diagnosis not present

## 2019-10-18 DIAGNOSIS — D120 Benign neoplasm of cecum: Secondary | ICD-10-CM | POA: Diagnosis not present

## 2019-10-18 DIAGNOSIS — K648 Other hemorrhoids: Secondary | ICD-10-CM | POA: Diagnosis not present

## 2019-10-18 DIAGNOSIS — R194 Change in bowel habit: Secondary | ICD-10-CM | POA: Diagnosis not present

## 2019-10-18 DIAGNOSIS — K635 Polyp of colon: Secondary | ICD-10-CM | POA: Diagnosis not present

## 2019-10-20 DIAGNOSIS — D120 Benign neoplasm of cecum: Secondary | ICD-10-CM | POA: Diagnosis not present

## 2019-10-20 DIAGNOSIS — D122 Benign neoplasm of ascending colon: Secondary | ICD-10-CM | POA: Diagnosis not present

## 2019-10-20 DIAGNOSIS — D125 Benign neoplasm of sigmoid colon: Secondary | ICD-10-CM | POA: Diagnosis not present

## 2019-10-20 DIAGNOSIS — K635 Polyp of colon: Secondary | ICD-10-CM | POA: Diagnosis not present

## 2019-10-27 IMAGING — CT CT ABDOMEN WO/W CM
2 of 12 series · 12 of 46 positions shown, 14 images · IV contrast (iopamidol)
Comparison: No priors.
COMPARISON: No priors.

Addendum:
CLINICAL DATA: 63-year-old female with history of cirrhosis or
fatty liver. Right upper quadrant abdominal pain. Right-sided breast
cancer treated with mastectomy and chemotherapy.

EXAM:
CT ABDOMEN WITHOUT AND WITH CONTRAST
TECHNIQUE: Multidetector CT imaging of the abdomen was performed following the
standard protocol before and following the bolus administration of
intravenous contrast.
CONTRAST:  125mL 276O58-7SS IOPAMIDOL (276O58-7SS) INJECTION 61%

[Series 6: arterial phase liver 2.00 br40 s3 cor · coronal · arterial · 0.55mm/px · 3 of 159 slices shown]
[im 40/159  soft-tissue]
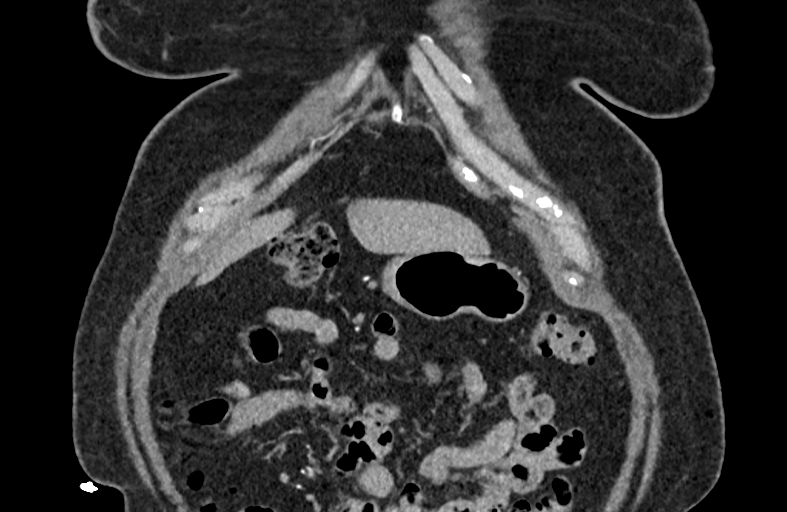
[im 80/159  soft-tissue]
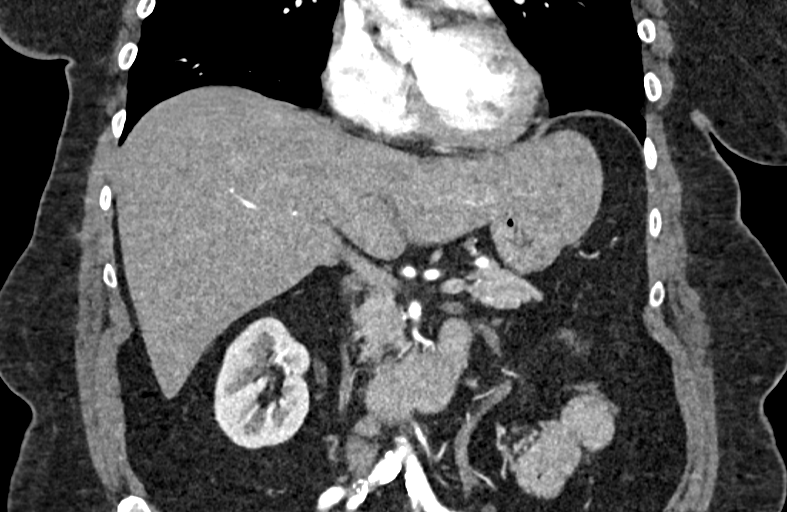
[im 119/159  soft-tissue]
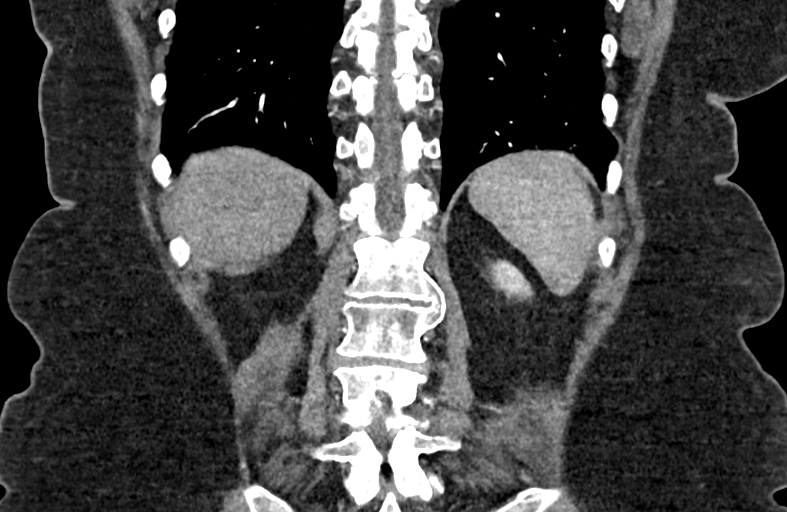

[Series 21: venous phase liver 1.00 br40 s3 axial · axial · portal-venous · 0.93mm/px · z∈[+1234,+1468]mm · 9 of 286 slices shown, 11 images]
[im 26/286  soft-tissue]
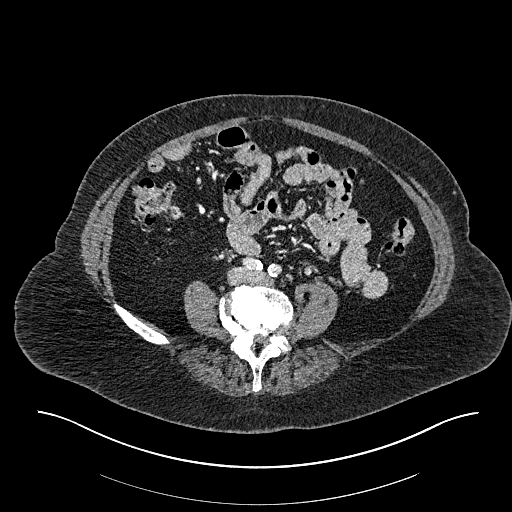
[im 26/286  bone]
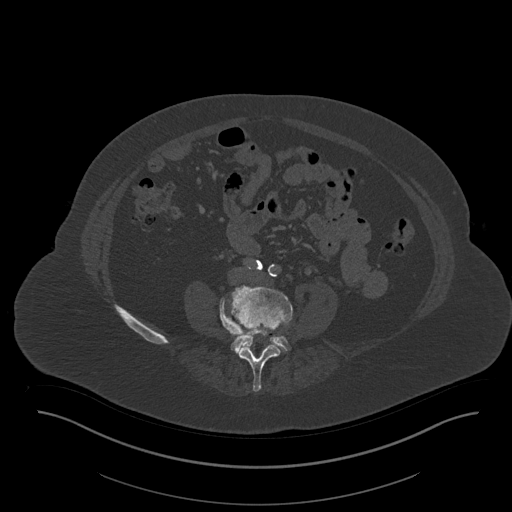
[im 52/286  soft-tissue]
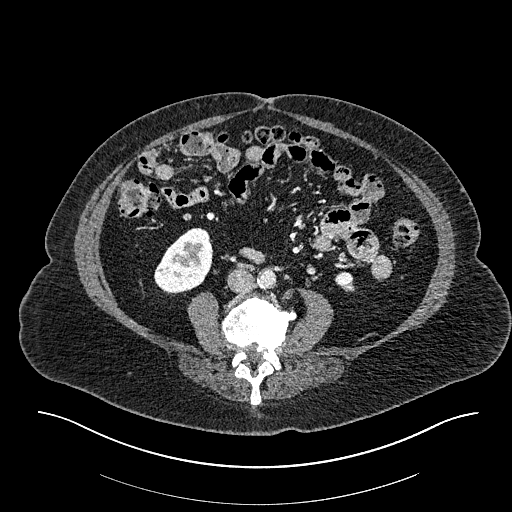
[im 78/286  soft-tissue]
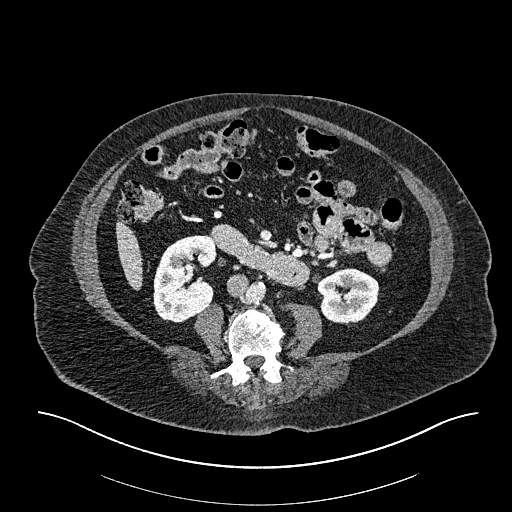
[im 104/286  soft-tissue]
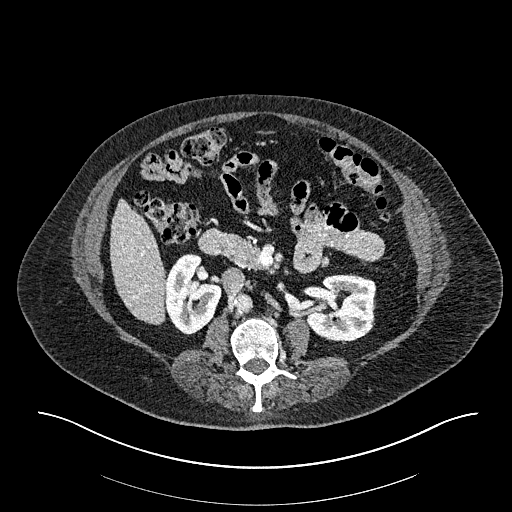
[im 156/286  soft-tissue]
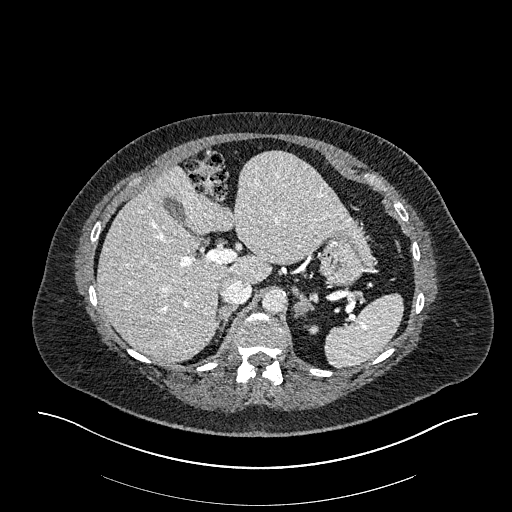
[im 182/286  soft-tissue]
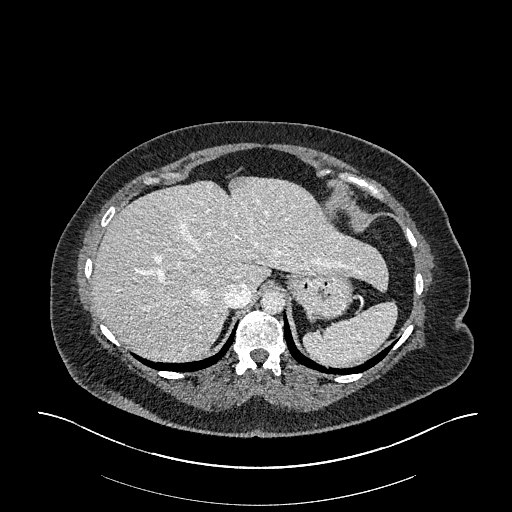
[im 208/286  soft-tissue]
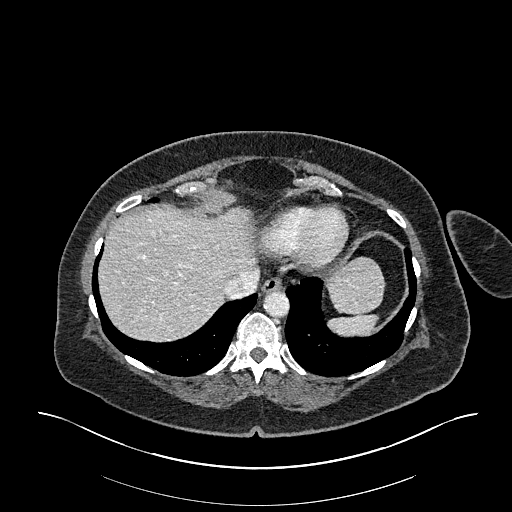
[im 234/286  soft-tissue]
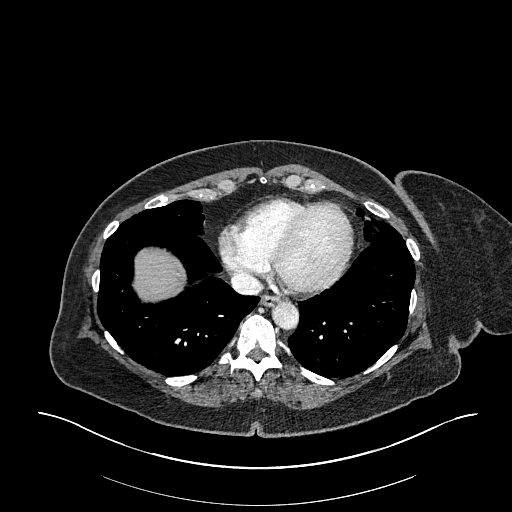
[im 260/286  soft-tissue]
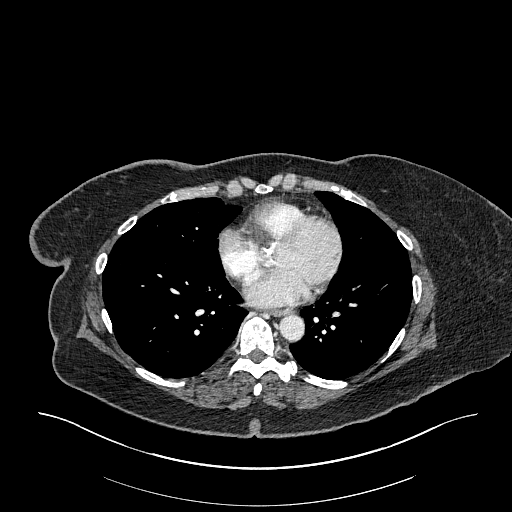
[im 260/286  bone]
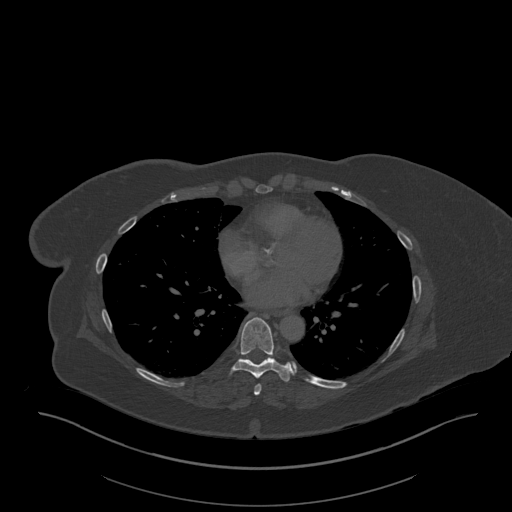

[12 of 46 positions shown; findings below may reference images not displayed]

FINDINGS: Lower chest: Severe calcifications of the aortic valve.
Atherosclerotic calcifications in the descending thoracic aorta as
well as the left anterior descending and right coronary arteries.

Hepatobiliary: No suspicious cystic or solid hepatic lesions. No
intra or extrahepatic biliary ductal dilatation. No evidence of
fatty infiltration of the liver. No definite morphologic changes in
the liver to suggest hepatic cirrhosis. No intra or extrahepatic
biliary ductal dilatation. Gallbladder is normal in appearance.

Pancreas: No pancreatic mass. No pancreatic ductal dilatation. No
pancreatic or peripancreatic fluid collections or inflammatory
changes.

Spleen: Unremarkable.

Adrenals/Urinary Tract: 2 low-attenuation lesions in the left
adrenal gland, largest of which measures up to 2 cm in diameter
(axial image 75 of series 10) measuring -4 HU, compatible with
benign adenomas. Right adrenal gland is normal in appearance.
Bilateral kidneys are normal in appearance. No hydroureteronephrosis
in the visualized portions of the abdomen.

Stomach/Bowel: Normal appearance of the stomach. No pathologic
dilatation of visualized portions of small bowel or colon.

Vascular/Lymphatic: Aortic atherosclerosis, without evidence of
aneurysm or dissection in the abdominal vasculature. No
lymphadenopathy noted in the abdomen.

Other: No significant volume of ascites and no pneumoperitoneum
noted in the visualized portions of the peritoneal cavity.

Musculoskeletal: Chronic appearing compression fracture of superior
endplate of L1 with 30% loss of anterior vertebral body height.
There are no aggressive appearing lytic or blastic lesions noted in
the visualized portions of the skeleton.
IMPRESSION: 1. No acute findings noted in the abdomen to account for the
patient's symptoms.
2. No findings to suggest hepatic cirrhosis or hepatic steatosis.
3. Two small left adrenal adenomas.
4. Aortic atherosclerosis, in addition to at least 2 vessel coronary
artery disease. Please note that although the presence of coronary
artery calcium documents the presence of coronary artery disease,
the severity of this disease and any potential stenosis cannot be
assessed on this non-gated CT examination. Assessment for potential
risk factor modification, dietary therapy or pharmacologic therapy
may be warranted, if clinically indicated.
5. There are calcifications of the aortic valve. Echocardiographic
correlation for evaluation of potential valvular dysfunction may be
warranted if clinically indicated.
6. Superior endplate compression fracture of L1 with approximately
30% loss of anterior vertebral body height.

ADDENDUM:
Per report, the patient has not had a right-sided mastectomy
(although this was the stated history on the order for the
examination), but rather has had a right-sided lumpectomy.

*** End of Addendum ***
FINDINGS: Lower chest: Severe calcifications of the aortic valve.
Atherosclerotic calcifications in the descending thoracic aorta as
well as the left anterior descending and right coronary arteries.

Hepatobiliary: No suspicious cystic or solid hepatic lesions. No
intra or extrahepatic biliary ductal dilatation. No evidence of
fatty infiltration of the liver. No definite morphologic changes in
the liver to suggest hepatic cirrhosis. No intra or extrahepatic
biliary ductal dilatation. Gallbladder is normal in appearance.

Pancreas: No pancreatic mass. No pancreatic ductal dilatation. No
pancreatic or peripancreatic fluid collections or inflammatory
changes.

Spleen: Unremarkable.

Adrenals/Urinary Tract: 2 low-attenuation lesions in the left
adrenal gland, largest of which measures up to 2 cm in diameter
(axial image 75 of series 10) measuring -4 HU, compatible with
benign adenomas. Right adrenal gland is normal in appearance.
Bilateral kidneys are normal in appearance. No hydroureteronephrosis
in the visualized portions of the abdomen.

Stomach/Bowel: Normal appearance of the stomach. No pathologic
dilatation of visualized portions of small bowel or colon.

Vascular/Lymphatic: Aortic atherosclerosis, without evidence of
aneurysm or dissection in the abdominal vasculature. No
lymphadenopathy noted in the abdomen.

Other: No significant volume of ascites and no pneumoperitoneum
noted in the visualized portions of the peritoneal cavity.

Musculoskeletal: Chronic appearing compression fracture of superior
endplate of L1 with 30% loss of anterior vertebral body height.
There are no aggressive appearing lytic or blastic lesions noted in
the visualized portions of the skeleton.
IMPRESSION: 1. No acute findings noted in the abdomen to account for the
patient's symptoms.
2. No findings to suggest hepatic cirrhosis or hepatic steatosis.
3. Two small left adrenal adenomas.
4. Aortic atherosclerosis, in addition to at least 2 vessel coronary
artery disease. Please note that although the presence of coronary
artery calcium documents the presence of coronary artery disease,
the severity of this disease and any potential stenosis cannot be
assessed on this non-gated CT examination. Assessment for potential
risk factor modification, dietary therapy or pharmacologic therapy
may be warranted, if clinically indicated.
5. There are calcifications of the aortic valve. Echocardiographic
correlation for evaluation of potential valvular dysfunction may be
warranted if clinically indicated.
6. Superior endplate compression fracture of L1 with approximately
30% loss of anterior vertebral body height.

## 2019-10-31 ENCOUNTER — Telehealth (HOSPITAL_COMMUNITY): Payer: Self-pay

## 2019-10-31 NOTE — Telephone Encounter (Signed)

## 2019-11-01 ENCOUNTER — Ambulatory Visit (INDEPENDENT_AMBULATORY_CARE_PROVIDER_SITE_OTHER): Payer: Medicare Other | Admitting: Physician Assistant

## 2019-11-01 ENCOUNTER — Other Ambulatory Visit: Payer: Self-pay

## 2019-11-01 ENCOUNTER — Ambulatory Visit (HOSPITAL_COMMUNITY)
Admission: RE | Admit: 2019-11-01 | Discharge: 2019-11-01 | Disposition: A | Discharge status: Home / Self Care | Payer: Medicare Other | Source: Ambulatory Visit | Attending: Surgery | Admitting: Surgery

## 2019-11-01 VITALS — BP 151/94 | HR 90 | Temp 98.1°F | Resp 20 | Ht 68.0 in | Wt 201.0 lb

## 2019-11-01 DIAGNOSIS — I6529 Occlusion and stenosis of unspecified carotid artery: Secondary | ICD-10-CM

## 2019-11-01 DIAGNOSIS — I6521 Occlusion and stenosis of right carotid artery: Secondary | ICD-10-CM | POA: Diagnosis not present

## 2019-11-01 NOTE — Progress Notes (Signed)
History of Present Illness:  Patient is a 64 y.o. year old female who presents for evaluation of asymptomatic carotid stenosis. She returns for postoperative follow-up today after recent right carotid endarterectomy.  She had some bradycardia in the hospital but has not had any problems since returning home.  She has no symptoms of TIA amaurosis or stroke.    Past Medical History:  Diagnosis Date  . Anxiety   . Arthritis    low back and hip pain intermittent  . Breast cancer ([place]) 06/02/07   r breast -surgery ,radiaology. chemotherapy  . Carpal tunnel syndrome    right hand  . Colon polyps   . Complication of anesthesia    Fentanyl, Versed-makes extra hyper, bradycardia x 1 in PACU, [facility] (08/15/11 cardiology felt neostigmine may have resulted in AV nodal block)   . Coronary artery disease   . Depression    denies  . Dysplasia of vulva   . Hypertension   . Palpitations    PSVT, s/p adenosine 08/04/16  . S/P breast lumpectomy 07/04/07   R breast  . S/P radiation therapy 2009    Past Surgical History:  Procedure Laterality Date  . APPENDECTOMY     age 46  . CESAREAN SECTION     x 2  . COLONOSCOPY W/ POLYPECTOMY    . COLONOSCOPY WITH PROPOFOL N/A 04/27/2016   Procedure: COLONOSCOPY WITH PROPOFOL;  Surgeon: Garlan Fair, MD;  Location: [facility];  Service: Endoscopy;  Laterality: N/A;  . DILATION AND CURETTAGE OF UTERUS     multiple  . ENDARTERECTOMY Right 06/09/2019  . ENDARTERECTOMY Right 06/09/2019   Procedure: ENDARTERECTOMY CAROTID RIGHT;  Surgeon: Elam Dutch, MD;  Location: [facility];  Service: Vascular;  Laterality: Right;  . INTRAUTERINE DEVICE INSERTION    . IUD REMOVAL    . MASTECTOMY PARTIAL / LUMPECTOMY W/ AXILLARY LYMPHADENECTOMY Right    lumpectomy and lymph nodes removed  . PATCH ANGIOPLASTY Right 06/09/2019   Procedure: Patch Angioplasty of right carotid artery using hemashield paltinum finesse patch;  Surgeon: Elam Dutch, MD;   Location: [facility] OR;  Service: Vascular;  Laterality: Right;  . TUBAL LIGATION    . VULVA SURGERY     Multiple times for dysplasia     Social History Social History   Tobacco Use  . Smoking status: Former Smoker    Packs/day: 0.75    Years: 20.00    Pack years: 15.00    Types: Cigarettes    Quit date: 05/06/1997    Years since quitting: 22.5  . Smokeless tobacco: Never Used  Substance Use Topics  . Alcohol use: Yes    Alcohol/week: 0.0 standard drinks    Comment: occ  . Drug use: No    Family History Family History  Problem Relation Age of Onset  . Colon cancer Mother 52  . Heart disease Father        heart attack  . Colon polyps Brother        one brother had large colon polyp that needed surgery to be removed  . Heart disease Paternal Grandfather     Allergies  Allergies  Allergen Reactions  . Prednisone Other (See Comments)  . Fentanyl Other (See Comments)    MAKES SUPER HYPER PER PT  . Midazolam Anxiety  . Vicodin [Hydrocodone-Acetaminophen] Anxiety    Extreme anxiety.     Current Outpatient Medications  Medication Sig Dispense Refill  .  alprazolam (XANAX) 2 MG tablet 1/2-1 tid prn anxiety. 270 tablet 0  . aspirin 81 MG chewable tablet Chew 81 mg by mouth daily.     . cyclobenzaprine (FLEXERIL) 5 MG tablet Take 1-2 tablets (5-10 mg total) by mouth 3 (three) times daily as needed for muscle spasms. 30 tablet 0  . Garlic [id number] MG CAPS Take by mouth.    . losartan-hydrochlorothiazide (HYZAAR) 50-12.5 MG tablet Take 0.5 tablets by mouth daily. 45 tablet 1  . Omega-3 Fatty Acids (FISH OIL) 1000 MG CAPS Take by mouth.     No current facility-administered medications for this visit.    ROS:   General:  No weight loss, Fever, chills  HEENT: No recent headaches, no nasal bleeding, no visual changes, no sore throat  Neurologic: No dizziness, blackouts, seizures. No recent symptoms of stroke or mini- stroke. No recent episodes of slurred speech, or temporary  blindness.  Cardiac: No recent episodes of chest pain/pressure, no shortness of breath at rest.  No shortness of breath with exertion.  Denies history of atrial fibrillation or irregular heartbeat  Vascular: No history of rest pain in feet.  No history of claudication.  No history of non-healing ulcer, No history of DVT   Pulmonary: No home oxygen, no productive cough, no hemoptysis,  No asthma or wheezing  Musculoskeletal:  [ ]  Arthritis, [ ]  Low back pain,  [ ]  Joint pain  Hematologic:No history of hypercoagulable state.  No history of easy bleeding.  No history of anemia  Gastrointestinal: No hematochezia or melena,  No gastroesophageal reflux, no trouble swallowing  Urinary: [ ]  chronic Kidney disease, [ ]  on HD - [ ]  MWF or [ ]  TTHS, [ ]  Burning with urination, [ ]  Frequent urination, [ ]  Difficulty urinating;   Skin: No rashes  Psychological: + history of anxiety,  + history of depression   Physical Examination  Vitals:   11/01/19 1509  BP: (!) 151/94  Pulse: 90  Resp: 20  Temp: 98.1 F (36.7 C)  SpO2: 97%  Weight: 201 lb (91.2 kg)  Height: 5\' 8"  (1.727 m)    Body mass index is 30.56 kg/m.  General:  Alert and oriented, no acute distress HEENT: Normal Neck: No bruit or JVD Pulmonary: Clear to auscultation bilaterally Cardiac: Regular Rate and Rhythm without murmur Gastrointestinal: Soft, non-tender, non-distended, no mass, no scars Skin: No rash Extremity Pulses:  2+ radial, brachial, femoral, dorsalis pedis, posterior tibial pulses bilaterally Musculoskeletal: No deformity or edema  Neurologic: Upper and lower extremity motor 5/5 and symmetric  DATA:     Right Carotid Findings:  +----------+--------+--------+--------+------------------+--------+       PSV cm/sEDV cm/sStenosisPlaque DescriptionComments  +----------+--------+--------+--------+------------------+--------+  CCA Prox 77   22                        +----------+--------+--------+--------+------------------+--------+  CCA Mid  67   22                      +----------+--------+--------+--------+------------------+--------+  CCA Distal70   20                      +----------+--------+--------+--------+------------------+--------+  ICA Prox 23   10   1-39%  diffuse            +----------+--------+--------+--------+------------------+--------+  ICA Mid  59   28                      +----------+--------+--------+--------+------------------+--------+  ICA Distal72   30                      +----------+--------+--------+--------+------------------+--------+  ECA    90   19                      +----------+--------+--------+--------+------------------+--------+   +----------+--------+-------+----------------+-------------------+       PSV cm/sEDV cmsDescribe    Arm Pressure (mmHG)  +----------+--------+-------+----------------+-------------------+  [id number]   0   Multiphasic, WNL            +----------+--------+-------+----------------+-------------------+   +---------+--------+--+--------+--+---------+  VertebralPSV cm/s46EDV cm/s17Antegrade  +---------+--------+--+--------+--+---------+      Left Carotid Findings:  +----------+--------+--------+--------+----------------------+--------+       PSV cm/sEDV cm/sStenosisPlaque Description  Comments  +----------+--------+--------+--------+----------------------+--------+  CCA Prox 90   24       smooth and homogeneous      +----------+--------+--------+--------+----------------------+--------+  CCA Mid  63   18   <50%  smooth and homogeneous       +----------+--------+--------+--------+----------------------+--------+  CCA Distal63   21                        +----------+--------+--------+--------+----------------------+--------+  ICA Prox 62   27   1-39%  smooth and homogeneous      +----------+--------+--------+--------+----------------------+--------+  ICA Mid  55   23                        +----------+--------+--------+--------+----------------------+--------+  ICA Distal53   22                        +----------+--------+--------+--------+----------------------+--------+  ECA    90   21                        +----------+--------+--------+--------+----------------------+--------+   +----------+--------+--------+----------------+-------------------+       PSV cm/sEDV cm/sDescribe    Arm Pressure (mmHG)  +----------+--------+--------+----------------+-------------------+  [id number]   0    Multiphasic, WNL            +----------+--------+--------+----------------+-------------------+   +---------+--------+-+--------+-+--------------+  VertebralPSV cm/s0EDV cm/s0Not identified  +---------+--------+-+--------+-+--------------+        Summary:  Right Carotid: Velocities in the right ICA are consistent with a 1-39%  stenosis.         Status post endarterectomy.   Left Carotid: Velocities in the left ICA are consistent with a 1-39%  stenosis.        Non-hemodynamically significant plaque <50% noted in the  CCA.   Vertebrals: Right vertebral artery demonstrates antegrade flow. Left  vertebral        artery was not visualized.  Subclavians: Normal flow hemodynamics were seen in bilateral subclavian        arteries.   ASSESSMENT:  S/P right CEA asymptomatic Duplex shows 1-39% stenosis B  ICA  PLAN: Continue ASA and BP control with daily exercise and healthy diet.  The patient refuses to take Statin medication.  F/U in 9 months for repeat carotid duplex.  We reviewed signs asymptomatic of stroke and TIA.  If she has these she will call 911.  Roxy Horseman PA-C [facility] Office: [phone]  MD in clinic [place]

## 2019-11-06 ENCOUNTER — Other Ambulatory Visit: Payer: Self-pay | Admitting: *Deleted

## 2019-11-06 DIAGNOSIS — I6529 Occlusion and stenosis of unspecified carotid artery: Secondary | ICD-10-CM

## 2020-01-01 ENCOUNTER — Telehealth: Payer: Self-pay | Admitting: Family Medicine

## 2020-01-01 DIAGNOSIS — I10 Essential (primary) hypertension: Secondary | ICD-10-CM

## 2020-01-01 DIAGNOSIS — K760 Fatty (change of) liver, not elsewhere classified: Secondary | ICD-10-CM

## 2020-01-01 DIAGNOSIS — Z0000 Encounter for general adult medical examination without abnormal findings: Secondary | ICD-10-CM

## 2020-01-01 NOTE — Telephone Encounter (Signed)
Pt has an appt 01/17/2020 with you and would like to know what lab work you are going to collect. I assume a CBC b/c previous abnormalities and a CMP what else would you like done?

## 2020-01-01 NOTE — Telephone Encounter (Signed)
Pt would like a call concerning what labs will be taken at her upcoming appointment on 01/17/20. Please advise at [phone]. I don't see any orders in or I would have said.

## 2020-01-02 ENCOUNTER — Telehealth: Payer: Self-pay

## 2020-01-02 NOTE — Telephone Encounter (Signed)
Patient called and stated that she went to the beach this weekend, and as she was walking in the sand on the beach, she began to experience chest pains after the fact. Patient stated that is hurt several times, and had to slow down a lot.

## 2020-01-02 NOTE — Telephone Encounter (Signed)
CBC, CMP, lipid panel.  Can be fasting at that visit or if lab visit sooner should be fasting at that time as well. Thanks.

## 2020-01-02 NOTE — Telephone Encounter (Signed)
Patient informed of which labs she will be having . She wanted to make sure she was getting a CMP done.

## 2020-01-02 NOTE — Telephone Encounter (Signed)
Does she want me to see her? I am happy to see her and probably needs a stress test if appropriate

## 2020-01-03 ENCOUNTER — Ambulatory Visit: Payer: Medicare Other | Admitting: Family Medicine

## 2020-01-04 NOTE — Telephone Encounter (Signed)
No, patient will just be here on her scheduled appt on the 21st. She agrees that she may need a stress test, but will discuss more about it in the visit.

## 2020-01-10 ENCOUNTER — Ambulatory Visit: Payer: Medicare Other | Admitting: Cardiology

## 2020-01-11 ENCOUNTER — Encounter: Payer: Self-pay | Admitting: Cardiology

## 2020-01-11 ENCOUNTER — Other Ambulatory Visit: Payer: Self-pay

## 2020-01-11 ENCOUNTER — Ambulatory Visit: Payer: Medicare Other | Admitting: Cardiology

## 2020-01-11 ENCOUNTER — Telehealth: Payer: Self-pay

## 2020-01-11 VITALS — BP 140/76 | HR 83 | Resp 16 | Ht 68.0 in | Wt 197.8 lb

## 2020-01-11 DIAGNOSIS — I209 Angina pectoris, unspecified: Secondary | ICD-10-CM | POA: Diagnosis not present

## 2020-01-11 DIAGNOSIS — I10 Essential (primary) hypertension: Secondary | ICD-10-CM | POA: Diagnosis not present

## 2020-01-11 DIAGNOSIS — I350 Nonrheumatic aortic (valve) stenosis: Secondary | ICD-10-CM

## 2020-01-11 DIAGNOSIS — Z9889 Other specified postprocedural states: Secondary | ICD-10-CM | POA: Diagnosis not present

## 2020-01-11 DIAGNOSIS — E782 Mixed hyperlipidemia: Secondary | ICD-10-CM

## 2020-01-11 DIAGNOSIS — I6521 Occlusion and stenosis of right carotid artery: Secondary | ICD-10-CM | POA: Diagnosis not present

## 2020-01-11 MED ORDER — NITROGLYCERIN 0.4 MG SL SUBL: 0.4000 mg | SUBLINGUAL_TABLET | SUBLINGUAL | 3 refills | Status: DC | PRN

## 2020-01-11 NOTE — Progress Notes (Signed)
Primary Physician/Referring:  [name], MD  Patient ID: Madison West, female    DOB: 11/29/1955, 64 y.o.   MRN: [id number]  Chief Complaint  Patient presents with   Hypertension   Hyperlipidemia   Follow-up    6 month    HPI: Madison West  is a 64 y.o. female  history of PSVT on 08/04/2016, terminated with adenosine, anxiety and depression, history of breast cancer status post radiation therapy and chemotherapy in 2009, hypertension, mild aortic valve stenosis, hyperlipidemia, palpitations, asymptomatic right carotid artery stenosis SP right carotid endarterectomy on 06/09/2019 presents here for follow-up.  She did have episode of PSVT while in the hospital postoperatively but also had asymptomatic bradycardia, she was reassured and discharged home.    States that she is under extreme stress.  She also states that for the past 2 to 3 weeks ago, while she was at the beach and had to walk, had exertional chest tightness associated with marked dyspnea.  Every time she exerts she has been having some chest tightness described as moderate associated with dyspnea and has been concerned about this.  She has had occasional palpitations lasting a few seconds.    Past Medical History:  Diagnosis Date   Anxiety    Arthritis    low back and hip pain intermittent   Breast cancer ([place]) 06/02/07   r breast -surgery ,radiaology. chemotherapy   Carpal tunnel syndrome    right hand   Colon polyps    Complication of anesthesia    Fentanyl, Versed-makes extra hyper, bradycardia x 1 in PACU, [facility] (08/15/11 cardiology felt neostigmine may have resulted in AV nodal block)    Coronary artery disease    Depression    denies   Dysplasia of vulva    Hypertension    Palpitations    PSVT, s/p adenosine 08/04/16   S/P breast lumpectomy 07/04/07   R breast   S/P radiation therapy 2009    Past Surgical History:  Procedure Laterality Date   APPENDECTOMY     age 28    CESAREAN SECTION     x 2   COLONOSCOPY W/ POLYPECTOMY     COLONOSCOPY WITH PROPOFOL N/A 04/27/2016   Procedure: COLONOSCOPY WITH PROPOFOL;  Surgeon: Garlan Fair, MD;  Location: [facility];  Service: Endoscopy;  Laterality: N/A;   DILATION AND CURETTAGE OF UTERUS     multiple   ENDARTERECTOMY Right 06/09/2019   ENDARTERECTOMY Right 06/09/2019   Procedure: ENDARTERECTOMY CAROTID RIGHT;  Surgeon: Elam Dutch, MD;  Location: [facility] OR;  Service: Vascular;  Laterality: Right;   INTRAUTERINE DEVICE INSERTION     IUD REMOVAL     MASTECTOMY PARTIAL / LUMPECTOMY W/ AXILLARY LYMPHADENECTOMY Right    lumpectomy and lymph nodes removed   PATCH ANGIOPLASTY Right 06/09/2019   Procedure: Patch Angioplasty of right carotid artery using hemashield paltinum finesse patch;  Surgeon: Elam Dutch, MD;  Location: [facility] OR;  Service: Vascular;  Laterality: Right;   TUBAL LIGATION     VULVA SURGERY     Multiple times for dysplasia    Social History   Tobacco Use   Smoking status: Former Smoker    Packs/day: 0.75    Years: 20.00    Pack years: 15.00    Types: Cigarettes    Quit date: 05/06/1997    Years since quitting: 22.6   Smokeless tobacco: Never Used  Substance Use Topics   Alcohol use: Yes    Alcohol/week: 0.0  standard drinks    Comment: occ   Marital Status: Widowed   Current Outpatient Medications on File Prior to Visit  Medication Sig Dispense Refill   alprazolam (XANAX) 2 MG tablet 1/2-1 tid prn anxiety. 270 tablet 0   aspirin 81 MG chewable tablet Chew 81 mg by mouth daily.      cyclobenzaprine (FLEXERIL) 5 MG tablet Take 1-2 tablets (5-10 mg total) by mouth 3 (three) times daily as needed for muscle spasms. 30 tablet 0   Garlic 3810 MG CAPS Take by mouth.     losartan-hydrochlorothiazide (HYZAAR) 50-12.5 MG tablet Take 0.5 tablets by mouth daily. 45 tablet 1   Omega-3 Fatty Acids (FISH OIL) 1000 MG CAPS Take by mouth.     No current  facility-administered medications on file prior to visit.   Review of Systems  Cardiovascular: Positive for chest pain and dyspnea on exertion. Negative for leg swelling.  Musculoskeletal: Positive for joint pain.  Gastrointestinal: Negative for melena.   Objective  Blood pressure 140/76, pulse 83, resp. rate 16, height 5\' 8"  (1.727 m), weight 197 lb 12.8 oz (89.7 kg), SpO2 97 %. Body mass index is 30.08 kg/m.   Vitals with BMI 01/11/2020 01/11/2020 11/01/2019  Height - 5\' 8"  5\' 8"   Weight - 197 lbs 13 oz 201 lbs  BMI - 17.51 02.58  Systolic 527 782 423  Diastolic 76 536 94  Pulse 83 80 90    Physical Exam Constitutional:      Appearance: She is well-developed.     Comments: Mildly obese  HENT:     Head: Atraumatic.  Neck:     Comments: Right carotid endarterectomy scar Cardiovascular:     Rate and Rhythm: Normal rate and regular rhythm.     Pulses: Intact distal pulses.          Carotid pulses are on the right side with bruit and on the left side with bruit.    Heart sounds: Murmur heard.  Midsystolic murmur is present with a grade of 3/6 at the upper right sternal border.      Comments: Right CEA scar noted. No JVD, no leg edema.  Pulmonary:     Effort: Pulmonary effort is normal. No accessory muscle usage or respiratory distress.     Breath sounds: Normal breath sounds.  Abdominal:     General: Bowel sounds are normal.     Palpations: Abdomen is soft.  Neurological:     Mental Status: She is oriented to person, place, and time.    Laboratory examination:   CMP Latest Ref Rng & Units 06/10/2019 06/06/2019 01/11/2019  Glucose 70 - 99 mg/dL 145(H) 111(H) 106(H)  BUN 8 - 23 mg/dL 10 10 12   Creatinine 0.44 - 1.00 mg/dL 0.68 0.70 0.72  Sodium 135 - 145 mmol/L 137 138 137  Potassium 3.5 - 5.1 mmol/L 3.7 3.9 3.9  Chloride 98 - 111 mmol/L 102 100 98  CO2 22 - 32 mmol/L 25 28 27   Calcium 8.9 - 10.3 mg/dL 9.1 9.6 9.5  Total Protein 6.5 - 8.1 g/dL - 7.7 7.2  Total  Bilirubin 0.3 - 1.2 mg/dL - 0.6 0.7  Alkaline Phos 38 - 126 U/L - 56 62  AST 15 - 41 U/L - 21 18  ALT 0 - 44 U/L - 20 17   CBC Latest Ref Rng & Units 06/10/2019 06/06/2019 01/11/2019  WBC 4.0 - 10.5 K/uL 15.3(H) 7.7 8.9  Hemoglobin 12.0 - 15.0 g/dL 14.5 16.0(H) 15.5(H)  Hematocrit  36 - 46 % 42.9 48.6(H) 46.4(H)  Platelets 150 - 400 K/uL 422(H) 429(H) 396   Lipid Panel     Component Value Date/Time   CHOL 255 (H) 10/15/2017 1624   TRIG 167 (H) 10/15/2017 1624   HDL 44 10/15/2017 1624   CHOLHDL 5.8 (H) 10/15/2017 1624   CHOLHDL 6.7 12/17/2011 0949   VLDL 48 (H) 12/17/2011 0949   LDLCALC 178 (H) 10/15/2017 1624   HEMOGLOBIN A1C Lab Results  Component Value Date   HGBA1C 5.8 (H) 10/15/2017   MPG 114 10/06/2011   TSH No results for input(s): TSH in the last 8760 hours. Cardiac Studies:   Exercise sestamibi stress test 04/04/2018: 1. The resting electrocardiogram demonstrated normal sinus rhythm, LAD, LAFB, incomplete RBBB, poor R progression and no resting arrhythmias. The stress electrocardiogram was normal. Patient exercised on Bruce protocol for 4:55 minutes and achieved 6.95 METS. Stress test terminated due to chest pressure and 87% MPHR achieved (Target HR >85%). Resting BP 150/88 and pea BP 218/102 mm hg. 2. Myocardial perfusion imaging is normal. Overall left ventricular systolic function was normal without regional wall motion abnormalities. The left ventricular ejection fraction was 72%. This is a low risk study. Chest pain could be related to hypertensive heart disease. Clinical correlation recommended.  Event monitor 30 days 03/11/2018: Episodic transmission 03/20/2018 1:30 pm: 5 beat NSVT.One brief atrial tach @ 142/min. Frequent PAC in bigeminal pattern, symptomatic. Rare PVC.  Echocardiogram 03/08/2018: Left ventricle cavity is normal in size. Moderate concentric hypertrophy of the left ventricle. Normal global wall motion. Doppler evidence of grade I (impaired)  diastolic dysfunction, elevated LAP. Calculated EF 60%. Mild aortic valve leaflet calcification. Severely restricted aortic valve leaflets. Mild aortic valve stenosis. Trace aortic regurgitation. Aortic valve peak pressure gradient of 23 and mean gradient of 13 mmHg, calculated aortic valve area 1.59 cm. In 2-D, the valve appears to be severely restricted. Compared to the echocardiogram 09/09/2016, mean gradient has increased from 13 mmHg. Otherwise no significant change. Consider TEE if clinically indicated.  Carotid artery duplex  05/09/2019: Critical stenosis in the right internal carotid artery (80-99%). The right PSV internal/common carotid artery ratio of 5.19 is consistent with a stenosis of >70%. Peak velocity 321/160 cm/s. Minimal stenosis in the left internal carotid artery (minimal). Antegrade right vertebral artery flow. Antegrade left vertebral artery flow. Compared to the study done on 11/04/2018, right ICA stenosis stenosis severity has increased from less than 70%.  Carotid artery duplex  11/01/2019:  Right Carotid: Velocities in the right ICA are consistent with a 1-39%  stenosis.  Status post endarterectomy.   Left Carotid: Velocities in the left ICA are consistent with a 1-39% stenosis.  Non-hemodynamically significant plaque <50% noted in the CCA.   Vertebrals: Right vertebral artery demonstrates antegrade flow. Left vertebral  artery was not visualized.  Subclavians: Normal flow hemodynamics were seen in bilateral subclavian  arteries.   Assessment     ICD-10-CM   1. Angina pectoris (HCC)  I20.9 PCV ECHOCARDIOGRAM COMPLETE    PCV MYOCARDIAL PERFUSION WO LEXISCAN    nitroGLYCERIN (NITROSTAT) 0.4 MG SL tablet  2. Essential hypertension  I10 EKG 12-Lead  3. Mild aortic stenosis  I35.0 PCV ECHOCARDIOGRAM COMPLETE  4. History of right-sided carotid endarterectomy 06/09/2019  Z98.890   5. Mixed hyperlipidemia  E78.2     EKG 01/26/2018: Normal sinus rhythm at 89 bpm with  first-degree AV block and borderline right atrial enlargement. Normal interval, no evidence of ischemia. No significant change compared to  EKG March 2018.  Recommendations:   Ka Bench  is a 64 y.o.  female  with history of PSVT on 08/04/2016, terminated with adenosine, severe anxiety and depression, history of breast cancer status post radiation therapy and chemotherapy in 2009, hypertension, mild aortic valve stenosis, hyperlipidemia, palpitations, asymptomatic right carotid artery stenosis SP right carotid endarterectomy on 06/09/2019 presents here for follow-up.    She did have episode of PSVT while in the hospital postoperatively but also had asymptomatic bradycardia, she was reassured and discharged home.  This is a 86-month office visit, she has had 4-5 episodes of exertional chest pain very classic for angina pectoris associated with marked dyspnea on exertion.  She has multiple cardiovascular factors including known carotid disease, hypertension, hyperlipidemia untreated and she does not want to be on statins.  She is also under extreme stress.  S/L NTG was prescribed and explained how to and when to use it and to notify us if there is change in frequency of use. Schedule for a Exercise Nuclear stress test to evaluate for myocardial ischemia.  Aortic stenotic murmur appears slightly more prominent, will schedule for an echocardiogram.  Otherwise blood pressure is well controlled, no significant change in the EKG, I will see her back in 6 months unless stress test is abnormal.  I discussed with her regarding bempedoic acid and Zetia combination as a nonstatin therapy and also described PCSK9 inhibitors.  Patient does not want to start this now but I have given her information regarding the medications and she will read about them and let me know.   Adrian Prows, MD, [facility] 01/11/2020, 10:27 AM Office: [phone]

## 2020-01-17 ENCOUNTER — Ambulatory Visit: Payer: Medicare Other | Admitting: Family Medicine

## 2020-01-18 ENCOUNTER — Other Ambulatory Visit: Payer: Self-pay

## 2020-01-18 ENCOUNTER — Ambulatory Visit: Payer: Medicare Other

## 2020-01-18 ENCOUNTER — Other Ambulatory Visit: Payer: Medicare Other

## 2020-01-18 DIAGNOSIS — I209 Angina pectoris, unspecified: Secondary | ICD-10-CM | POA: Diagnosis not present

## 2020-01-18 DIAGNOSIS — I350 Nonrheumatic aortic (valve) stenosis: Secondary | ICD-10-CM

## 2020-01-19 ENCOUNTER — Encounter: Payer: Self-pay | Admitting: Family Medicine

## 2020-01-19 ENCOUNTER — Ambulatory Visit (INDEPENDENT_AMBULATORY_CARE_PROVIDER_SITE_OTHER): Payer: Medicare Other | Admitting: Family Medicine

## 2020-01-19 VITALS — BP 124/85 | HR 103 | Temp 98.3°F | Ht 68.0 in | Wt 195.8 lb

## 2020-01-19 DIAGNOSIS — F418 Other specified anxiety disorders: Secondary | ICD-10-CM | POA: Diagnosis not present

## 2020-01-19 DIAGNOSIS — K760 Fatty (change of) liver, not elsewhere classified: Secondary | ICD-10-CM | POA: Diagnosis not present

## 2020-01-19 DIAGNOSIS — I6521 Occlusion and stenosis of right carotid artery: Secondary | ICD-10-CM | POA: Diagnosis not present

## 2020-01-19 DIAGNOSIS — I10 Essential (primary) hypertension: Secondary | ICD-10-CM

## 2020-01-19 DIAGNOSIS — R079 Chest pain, unspecified: Secondary | ICD-10-CM | POA: Diagnosis not present

## 2020-01-19 DIAGNOSIS — Z0000 Encounter for general adult medical examination without abnormal findings: Secondary | ICD-10-CM | POA: Diagnosis not present

## 2020-01-19 DIAGNOSIS — F411 Generalized anxiety disorder: Secondary | ICD-10-CM | POA: Diagnosis not present

## 2020-01-19 MED ORDER — LOSARTAN POTASSIUM-HCTZ 50-12.5 MG PO TABS: 0.5000 | ORAL_TABLET | Freq: Every day | ORAL | 1 refills | Status: DC

## 2020-01-19 NOTE — Progress Notes (Signed)
Subjective:  Patient ID: Madison West, female    DOB: [birth year]  Age: 64 y.o. MRN: [id number]  CC:  Chief Complaint  Patient presents with  . Anxiety    f/u   . Hyperlipidemia    HPI Aelyn Stanaland presents for   Hypertension: Losartan hctz 50/12.51m  -1/2 tablet daily, stable at last visit.  History of PSVT, mild aortic valve stenosis, palpitations and asymptomatic right carotid artery stenosis status post right endarterectomy in December 2020.  Followed by cardiology, Dr. GEinar Gip Last cardiology visit July 22.  Increase stress noted at that visit, but some concerning symptoms for possible angina and dyspnea on exertion.  Plan for exercise nuclear stress test August 11 to evaluate for myocardial ischemia, and sublingual nitroglycerin was prescribed.  Echocardiogram ordered with more prominent aortic stenotic murmur.  Has not taken nitroglycerin. Reviewed note and instructions from Dr. 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 Some with strenuous vacuuming as well.  Tolerating 1/2 dose Home readings:130 or lower on higher reading.  BP Readings from Last 3 Encounters:  01/19/20 124/85  01/11/20 140/76  11/01/19 (!) 151/94   Lab Results  Component Value Date   CREATININE 0.67 01/19/2020  no recent blood work.  Fasting today.  Does not want to take statin - has changed diet.   Lab Results  Component Value Date   CHOL 253 (H) 01/19/2020   HDL 60 01/19/2020   LDLCALC 168 (H) 01/19/2020   TRIG 138 01/19/2020   CHOLHDL 4.2 01/19/2020    Leukocytosis: At time of endarterectomy. Has not been repeated.  Lab Results  Component Value Date   WBC 7.3 01/19/2020   HGB 15.4 01/19/2020   HCT 45.7 01/19/2020   MCV 97 01/19/2020   PLT 394 01/19/2020    Generalized anxiety disorder: Last discussed in April.  Longstanding symptoms with previous benzodiazepine monotherapy.  Ultimately has been referred to  psychiatry, established with Dr. KToy Care  Continued on benzodiazepine, and had been a discussion of using Librium.  We discussed meeting with her previous therapist, Dr. PFerdinand Langobut difficulty with time when discussed in April.  Other counseling phone numbers were also provided. Last visit with Dr. KToy Care- virtual visit June 10th.  Does not feel like this is a good fit - will be looking into other options.  Looking to meet with different psychiatrist.  Stress with her health and her daughter who is in bad relationship and also has an anxiety disorder.  Not meeting with a therapist.  KWendie Simmervacation over 4th of July.   GAD 7 : Generalized Anxiety Score 01/19/2020 10/04/2019  Nervous, Anxious, on Edge 3 3  Control/stop worrying 1 3  Worry too much - different things 3 3  Trouble relaxing 1 2  Restless 0 0  Easily annoyed or irritable 0 1  Afraid - awful might happen 3 0  Total GAD 7 Score 11 12  Anxiety Difficulty Somewhat difficult -     [facility] 01/19/2020 10/04/2019 07/03/2019 02/07/2019 05/24/2018  Decreased Interest 0 0 1 0 0  Down, Depressed, Hopeless 0 0 1 0 0  PHQ - 2 Score 0 0 2 0 0  Altered sleeping - - 0 - -  Tired, decreased energy - - 1 - -  Change in appetite - - 0 - -  Feeling bad or failure about yourself  - - 0 - -  Trouble concentrating - - 0 - -  Moving slowly or fidgety/restless - - 0 - -  Suicidal thoughts - - 0 - -  PHQ-9 Score - - 3 - -  Some recent data might be hidden       History Patient Active Problem List   Diagnosis Date Noted  . Asymptomatic stenosis of right carotid artery without infarction 06/09/2019  . CRPS (complex regional pain syndrome), upper limb 10/19/2012  . History of DVT (deep vein thrombosis) 10/05/2011  . Hypertension 10/04/2011  . Depression 10/04/2011  . Personal history of vulvar dysplasia 08/15/2011  . Grade 3 vulvar intraepithelial neoplasia 06/30/2011   Past Medical History:  Diagnosis Date  . Anxiety   .  Arthritis    low back and hip pain intermittent  . Breast cancer ([place]) 06/02/07   r breast -surgery ,radiaology. chemotherapy  . Carpal tunnel syndrome    right hand  . Colon polyps   . Complication of anesthesia    Fentanyl, Versed-makes extra hyper, bradycardia x 1 in PACU, [facility] (08/15/11 cardiology felt neostigmine may have resulted in AV nodal block)   . Coronary artery disease   . Depression    denies  . Dysplasia of vulva   . Hypertension   . Palpitations    PSVT, s/p adenosine 08/04/16  . S/P breast lumpectomy 07/04/07   R breast  . S/P radiation therapy 2009   Past Surgical History:  Procedure Laterality Date  . APPENDECTOMY     age 33  . CESAREAN SECTION     x 2  . COLONOSCOPY W/ POLYPECTOMY    . COLONOSCOPY WITH PROPOFOL N/A 04/27/2016   Procedure: COLONOSCOPY WITH PROPOFOL;  Surgeon: Garlan Fair, MD;  Location: [facility];  Service: Endoscopy;  Laterality: N/A;  . DILATION AND CURETTAGE OF UTERUS     multiple  . ENDARTERECTOMY Right 06/09/2019  . ENDARTERECTOMY Right 06/09/2019   Procedure: ENDARTERECTOMY CAROTID RIGHT;  Surgeon: Elam Dutch, MD;  Location: [facility];  Service: Vascular;  Laterality: Right;  . INTRAUTERINE DEVICE INSERTION    . IUD REMOVAL    . MASTECTOMY PARTIAL / LUMPECTOMY W/ AXILLARY LYMPHADENECTOMY Right    lumpectomy and lymph nodes removed  . PATCH ANGIOPLASTY Right 06/09/2019   Procedure: Patch Angioplasty of right carotid artery using hemashield paltinum finesse patch;  Surgeon: Elam Dutch, MD;  Location: [facility] OR;  Service: Vascular;  Laterality: Right;  . TUBAL LIGATION    . VULVA SURGERY     Multiple times for dysplasia   Allergies  Allergen Reactions  . Prednisone Other (See Comments)  . Fentanyl Other (See Comments)    MAKES SUPER HYPER PER PT  . Midazolam Anxiety  . Vicodin [Hydrocodone-Acetaminophen] Anxiety    Extreme anxiety.   Prior to Admission medications   Medication Sig Start Date End Date Taking?  Authorizing Provider  alprazolam Duanne Moron) 2 MG tablet 1/2-1 tid prn anxiety. 07/03/19  Yes [name], MD  aspirin 81 MG chewable tablet Chew 81 mg by mouth daily.    Yes [provider]  cyclobenzaprine (FLEXERIL) 5 MG tablet Take 1-2 tablets (5-10 mg total) by mouth 3 (three) times daily as needed for muscle spasms. 07/03/19  Yes [name], MD  Garlic 6767 MG CAPS Take by mouth.   Yes [provider]  losartan-hydrochlorothiazide (HYZAAR) 50-12.5 MG tablet Take 0.5 tablets by mouth daily. 10/04/19  Yes [name], MD  Omega-3 Fatty Acids (FISH OIL) 1000 MG  CAPS Take by mouth.   Yes [provider]   Social History   Socioeconomic History  . Marital status: Widowed    Spouse name: Not on file  . Number of children: 2  . Years of education: Not on file  . Highest education level: Not on file  Occupational History  . Occupation: Metallurgist: Arboriculturist  Tobacco Use  . Smoking status: Former Smoker    Packs/day: 0.75    Years: 20.00    Pack years: 15.00    Types: Cigarettes    Quit date: 05/06/1997    Years since quitting: 22.7  . Smokeless tobacco: Never Used  Vaping Use  . Vaping Use: Never used  Substance and Sexual Activity  . Alcohol use: Yes    Alcohol/week: 0.0 standard drinks    Comment: occ  . Drug use: No  . Sexual activity: Not Currently  Other Topics Concern  . Not on file  Social History Narrative  . Not on file   Social Determinants of Health   Financial Resource Strain:   . Difficulty of Paying Living Expenses:   Food Insecurity:   . Worried About Charity fundraiser in the Last Year:   . Arboriculturist in the Last Year:   Transportation Needs:   . Film/video editor (Medical):   Marland Kitchen Lack of Transportation (Non-Medical):   Physical Activity:   . Days of Exercise per Week:   . Minutes of Exercise per Session:   Stress:   . Feeling of Stress :   Social Connections:   . Frequency of  Communication with Friends and Family:   . Frequency of Social Gatherings with Friends and Family:   . Attends Religious Services:   . Active Member of Clubs or Organizations:   . Attends Archivist Meetings:   Marland Kitchen Marital Status:   Intimate Partner Violence:   . Fear of Current or Ex-Partner:   . Emotionally Abused:   Marland Kitchen Physically Abused:   . Sexually Abused:     Review of Systems PER hpi.   Objective:   Vitals:   01/19/20 1331  BP: 124/85  Pulse: 103  Temp: 98.3 F (36.8 C)  TempSrc: Temporal  SpO2: 95%  Weight: 195 lb 12.8 oz (88.8 kg)  Height: _0  (1.727 m)     Physical Exam Vitals reviewed.  Constitutional:      Appearance: She is well-developed.  HENT:     Head: Normocephalic and atraumatic.  Eyes:     Conjunctiva/sclera: Conjunctivae normal.     Pupils: Pupils are equal, round, and reactive to light.  Neck:     Vascular: No carotid bruit.  Cardiovascular:     Rate and Rhythm: Normal rate and regular rhythm.     Heart sounds: Normal heart sounds.  Pulmonary:     Effort: Pulmonary effort is normal.     Breath sounds: Normal breath sounds.  Abdominal:     Palpations: Abdomen is soft. There is no pulsatile mass.     Tenderness: There is no abdominal tenderness.  Skin:    General: Skin is warm and dry.  Neurological:     Mental Status: She is alert and oriented to person, place, and time.  Psychiatric:        Attention and Perception: Attention normal.        Mood and Affect: Mood is anxious.        Behavior: Behavior normal.  Thought Content: Thought content normal.       49 minutes spent during visit, greater than 50% counseling and assimilation of information, chart review, and discussion of plan.   Assessment & Plan:  Kierah Goatley is a 64 y.o. female . Essential hypertension - Plan: Lipid panel, CMP14+EGFR, CBC with Differential/Platelet, losartan-hydrochlorothiazide (HYZAAR) 50-12.5 MG tablet  -Check labs, stable with  current regimen, continue same.  Previous leukocytosis at time of surgery, repeat levels.  Afebrile.  Generalized anxiety disorder Situational anxiety  -Followed by psychiatry, she is considering looking into other options.  New referral can be placed if needed, but will need to continue medication from psychiatry.   - Again stressed importance of counseling, especially with her various health and family/situational stressors.  Expect this to be a much-needed addition to her treatment regimen.  It appears that trust with a new therapist has been a limitation.  Does agree to try new provider if needed.  phone numbers again provided.  Chest pain, unspecified type  -Under care of cardiology with planned stress testing, recent echo but results not yet known.  ER precautions given as well as discussed previous recommendations from cardiology and use of sublingual nitroglycerin.  Fatty (change of) liver, not elsewhere classified - Plan: Lipid panel, CMP14+EGFR, CBC with Differential/Platelet  -Repeat CMP  Meds ordered this encounter  Medications  . losartan-hydrochlorothiazide (HYZAAR) 50-12.5 MG tablet    Sig: Take 0.5 tablets by mouth daily.    Dispense:  45 tablet    Refill:  1   Patient Instructions   If you do have worsening chest pain or new symptoms with your chest symptoms, I do recommend emergency room evaluation. Continue to follow up with your cardiologist. If you have not heard about your echo by Monday, then call your cardiologist to see if they can provide you info about timing of those results.   Continue same dose of 1/2 pill of losartan hctz for now.   Let me know if a new refill is needed for different psychiatrist and name and I am happy to enter that refill.   I do recommend meeting with a therapist as that may be very helpful as part of your anxiety treatment. If you are unable to meet with your prior therapist, I do recommend meeting with someone:   Here are a few  options for counseling:  [facility]:  [place] [phone]  [facility] [place].  [phone] [address]  If you have lab work done today you will be contacted with your lab results within the next 2 weeks.  If you have not heard from [place] then please contact us. The fastest way to get your results is to register for My Chart.   IF you received an x-ray today, you will receive an invoice from [facility]. Please contact [facility] at [phone] with questions or concerns regarding your invoice.   IF you received labwork today, you will receive an invoice from [facility]. Please contact [facility] at [phone] with questions or concerns regarding your invoice.   Our billing staff will not be able to assist you with questions regarding bills from these companies.  You will be contacted with the lab results as soon as they are available. The fastest way to get your results is to activate your My Chart account. Instructions are located on the last page of this paperwork. If you have not heard from [place] regarding the results in 2 weeks,  please contact this office.          Signed, Merri Ray, MD [facility]

## 2020-01-19 NOTE — Patient Instructions (Addendum)
If you do have worsening chest pain or new symptoms with your chest symptoms, I do recommend emergency room evaluation. Continue to follow up with your cardiologist. If you have not heard about your echo by Monday, then call your cardiologist to see if they can provide you info about timing of those results.   Continue same dose of 1/2 pill of losartan hctz for now.   Let me know if a new refill is needed for different psychiatrist and name and I am happy to enter that refill.   I do recommend meeting with a therapist as that may be very helpful as part of your anxiety treatment. If you are unable to meet with your prior therapist, I do recommend meeting with someone:   Here are a few options for counseling:  [facility]:  [place] [phone]  [facility] [place].  [phone] [address]  If you have lab work done today you will be contacted with your lab results within the next 2 weeks.  If you have not heard from [place] then please contact us. The fastest way to get your results is to register for My Chart.   IF you received an x-ray today, you will receive an invoice from [facility]. Please contact [facility] at [phone] with questions or concerns regarding your invoice.   IF you received labwork today, you will receive an invoice from [place]. Please contact [facility] at [phone] with questions or concerns regarding your invoice.   Our billing staff will not be able to assist you with questions regarding bills from these companies.  You will be contacted with the lab results as soon as they are available. The fastest way to get your results is to activate your My Chart account. Instructions are located on the last page of this paperwork. If you have not heard from [place] regarding the results in 2 weeks, please contact this office.

## 2020-01-20 LAB — CBC WITH DIFFERENTIAL/PLATELET
Basophils Absolute: 0.1 10*3/uL (ref 0.0–0.2)
Basos: 1 %
EOS (ABSOLUTE): 0.2 10*3/uL (ref 0.0–0.4)
Eos: 2 %
Hematocrit: 45.7 % (ref 34.0–46.6)
Hemoglobin: 15.4 g/dL (ref 11.1–15.9)
Immature Grans (Abs): 0 10*3/uL (ref 0.0–0.1)
Immature Granulocytes: 0 %
Lymphocytes Absolute: 1.3 10*3/uL (ref 0.7–3.1)
Lymphs: 17 %
MCH: 32.6 pg (ref 26.6–33.0)
MCHC: 33.7 g/dL (ref 31.5–35.7)
MCV: 97 fL (ref 79–97)
Monocytes Absolute: 0.5 10*3/uL (ref 0.1–0.9)
Monocytes: 7 %
Neutrophils Absolute: 5.2 10*3/uL (ref 1.4–7.0)
Neutrophils: 73 %
Platelets: 394 10*3/uL (ref 150–450)
RBC: 4.72 x10E6/uL (ref 3.77–5.28)
RDW: 12.4 % (ref 11.7–15.4)
WBC: 7.3 10*3/uL (ref 3.4–10.8)

## 2020-01-20 LAB — CMP14+EGFR
ALT: 17 IU/L (ref 0–32)
AST: 18 IU/L (ref 0–40)
Albumin/Globulin Ratio: 1.9 (ref 1.2–2.2)
Albumin: 4.6 g/dL (ref 3.8–4.8)
Alkaline Phosphatase: 64 IU/L (ref 48–121)
BUN/Creatinine Ratio: 16 (ref 12–28)
BUN: 11 mg/dL (ref 8–27)
Bilirubin Total: 0.5 mg/dL (ref 0.0–1.2)
CO2: 25 mmol/L (ref 20–29)
Calcium: 10 mg/dL (ref 8.7–10.3)
Chloride: 97 mmol/L (ref 96–106)
Creatinine, Ser: 0.67 mg/dL (ref 0.57–1.00)
GFR calc Af Amer: 106 mL/min/{1.73_m2} (ref >59)
GFR calc non Af Amer: 92 mL/min/{1.73_m2} (ref >59)
Globulin, Total: 2.4 g/dL (ref 1.5–4.5)
Glucose: 112 mg/dL — ABNORMAL HIGH (ref 65–99)
Potassium: 4.2 mmol/L (ref 3.5–5.2)
Sodium: 135 mmol/L (ref 134–144)
Total Protein: 7 g/dL (ref 6.0–8.5)

## 2020-01-20 LAB — LIPID PANEL
Chol/HDL Ratio: 4.2 ratio (ref 0.0–4.4)
Cholesterol, Total: 253 mg/dL — ABNORMAL HIGH (ref 100–199)
HDL: 60 mg/dL (ref >39)
LDL Chol Calc (NIH): 168 mg/dL — ABNORMAL HIGH (ref 0–99)
Triglycerides: 138 mg/dL (ref 0–149)
VLDL Cholesterol Cal: 25 mg/dL (ref 5–40)

## 2020-01-23 ENCOUNTER — Encounter: Payer: Self-pay | Admitting: Radiology

## 2020-01-24 ENCOUNTER — Telehealth: Payer: Self-pay

## 2020-01-24 NOTE — Telephone Encounter (Signed)
OV has been scheduled for 8/20 at 9:15am with you. Stress test on 8/11.   Thank you.

## 2020-01-24 NOTE — Telephone Encounter (Signed)
Please set up an OV with me after the stress test. Madison West

## 2020-01-24 NOTE — Telephone Encounter (Signed)
Spoke to the patient in regards to her most recent echocardiogram.  Explained to her the results as she was concerned.Patient is informed her LVEF is preserved. Also informed the patient that her aortic stenosis has progressed from mild to mild/moderate.Also informed the patient that she has regional wall motion abnormalities and the upcoming stress test will help determine if it secondary to CAD. Patient states that she is concerned about the upcoming stress test.  Answered her and her daughter's (in medicine) questions was also on the phone.  She is informed that if her symptoms increase in intensity, frequency, and/or duration or has typical chest pain as discussed she should go to the closest ER via EMS. I also informed the patient that I will forward a summary of our discussion to her primary cardiologist as well.

## 2020-01-24 NOTE — Telephone Encounter (Signed)
Pt called to see about her echo results. Inform her about the results and wanted more detailed about the results. Can you please call you back because she is a little upset.

## 2020-01-26 ENCOUNTER — Telehealth: Payer: Self-pay | Admitting: Family Medicine

## 2020-01-26 ENCOUNTER — Telehealth: Payer: Self-pay

## 2020-01-26 NOTE — Telephone Encounter (Signed)
Madison West has sent this letter out by mail on 01/23/20.

## 2020-01-26 NOTE — Telephone Encounter (Signed)
Patient called left a message with concerns about stress test, Nitroglycerin, and all results in general. She will talk to Dr. Einar Gip on Monday at her appt.

## 2020-01-26 NOTE — Telephone Encounter (Signed)
Pt is needing a phone call with recent lab results and please mail a copy as well . Pt does not have mychart

## 2020-01-29 ENCOUNTER — Other Ambulatory Visit: Payer: Medicare Other

## 2020-01-31 ENCOUNTER — Other Ambulatory Visit: Payer: Medicare Other

## 2020-01-31 ENCOUNTER — Ambulatory Visit: Payer: Medicare Other

## 2020-01-31 ENCOUNTER — Other Ambulatory Visit: Payer: Self-pay

## 2020-01-31 DIAGNOSIS — I209 Angina pectoris, unspecified: Secondary | ICD-10-CM

## 2020-01-31 DIAGNOSIS — Z9889 Other specified postprocedural states: Secondary | ICD-10-CM

## 2020-01-31 DIAGNOSIS — I2510 Atherosclerotic heart disease of native coronary artery without angina pectoris: Secondary | ICD-10-CM

## 2020-01-31 DIAGNOSIS — I25119 Atherosclerotic heart disease of native coronary artery with unspecified angina pectoris: Secondary | ICD-10-CM | POA: Diagnosis not present

## 2020-02-02 ENCOUNTER — Telehealth: Payer: Self-pay

## 2020-02-02 NOTE — Telephone Encounter (Signed)
Patient called requesting her stress test results and is upset, because she is concerned that it is something terrible. Please give her a call ASAP!!

## 2020-02-05 ENCOUNTER — Telehealth: Payer: Self-pay | Admitting: Cardiology

## 2020-02-05 DIAGNOSIS — E782 Mixed hyperlipidemia: Secondary | ICD-10-CM

## 2020-02-05 DIAGNOSIS — I25119 Atherosclerotic heart disease of native coronary artery with unspecified angina pectoris: Secondary | ICD-10-CM | POA: Diagnosis not present

## 2020-02-05 DIAGNOSIS — I209 Angina pectoris, unspecified: Secondary | ICD-10-CM

## 2020-02-05 DIAGNOSIS — Z9889 Other specified postprocedural states: Secondary | ICD-10-CM | POA: Diagnosis not present

## 2020-02-05 DIAGNOSIS — I2510 Atherosclerotic heart disease of native coronary artery without angina pectoris: Secondary | ICD-10-CM

## 2020-02-05 NOTE — Progress Notes (Unsigned)
Telephone encounter see notes

## 2020-02-05 NOTE — Telephone Encounter (Signed)
ICD-10-CM   1. Angina pectoris (HCC)  I20.9   2. Coronary artery calcification seen on CAT scan  I25.10   3. History of right-sided carotid endarterectomy 06/09/2019  Z98.890   4. Mixed hyperlipidemia  E78.2     Exercise Myoview stress test 01/31/2020: Exercise nuclear stress test was performed using Bruce protocol. Patient reached 6.0 METS, and 88% of age predicted maximum heart rate. Exercise capacity was low. 8/10 chest pain reported. Heart rate and hemodynamic response were normal. Stress EKG shows minimal ST elevation in lead aVR, horizontal ST depressions 1 mm in leds V4-V6, and frequent PVC's. Normal myocardial perfusion. Stress LVEF 60%.  In presence of exertional chest pain, recommend clinical correlation.  Echocardiogram 01/18/2020:  Normal LV systolic function with visual EF 55-60%. Mild to moderate left  ventricular hypertrophy. Left ventricle cavity is normal in size. Left  ventricle regional wall motion findings: Mid inferior, Mid inferoseptal  and Apical septal hypokinesis. Calculated EF 55%.  Left atrial cavity is moderately dilated.  Mild (Grade I) aortic regurgitation.  Peak velocity 2.63m/s, mean gradient 17.65mmHg, Dimensional index 0.40,  findings suggestive of mild to moderate aortic stenosis.  Mild tricuspid regurgitation.  Trivial pericardial effusion with no hemodynamic compromise.  Compared to prior study dated 03/08/2018: LVEF remains preserved, now  RWMAs as noted above, and AS is now mild to moderate.    I have reviewed the results of the stress test with the patient and her daughter over the telephone. Schedule for cardiac catheterization, and possible angioplasty. We discussed regarding risks, benefits, alternatives to this including stress testing, CTA and continued medical therapy. Patient wants to proceed. Understands <1-2% risk of death, stroke, MI, urgent CABG, bleeding, infection, renal failure but not limited to these.  I gave her option to come in to  discuss further as well.   I suspect multivessel disease. She continue to not wanting to take statins and is very difficult to manage her risk factors. She does not want to consider PCSK9 inhibitors as well.   She does not want Versed or fentanyl as she does not react to them and becomes anxious and "hyper". Will do under Benadryl sedation.   Adrian Prows, MD, [facility] 02/05/2020, 10:57 AM Office: [phone]

## 2020-02-08 NOTE — Telephone Encounter (Signed)
Cath has been sch for 9/7 at 9:am -   Does patient need labs? She had labs done on 7/30 , please advice.   Thank you   -Leda Quail

## 2020-02-09 ENCOUNTER — Ambulatory Visit: Payer: Medicare Other | Admitting: Cardiology

## 2020-02-11 NOTE — Telephone Encounter (Signed)
I placed CBC and BMP orders but not sure they went through to [facility]. Yes she does need them

## 2020-02-12 NOTE — Telephone Encounter (Signed)
I discussed with the patient again regarding outpatient status.  But I did inform her that she should come prepared to spend the night in case she needs further evaluation or if there is any complication or complexity in intervention.  Patient voiced understanding.   Adrian Prows, MD, [facility] 02/12/2020, 5:59 PM Office: [phone]

## 2020-02-12 NOTE — Telephone Encounter (Signed)
Patient has been informed of lab orders.   Patient wants a call back from provider re this procedure. Patient wants to know when will she be able to return to work, how long will she be in hospital. Patient was not taking my answers and wanted to speak to MD.   Thank you.   -Leda Quail

## 2020-02-23 ENCOUNTER — Other Ambulatory Visit (HOSPITAL_COMMUNITY)
Admission: RE | Admit: 2020-02-23 | Discharge: 2020-02-23 | Disposition: A | Discharge status: Home / Self Care | Payer: Medicare Other | Source: Ambulatory Visit | Attending: Cardiology | Admitting: Cardiology

## 2020-02-23 ENCOUNTER — Other Ambulatory Visit: Payer: Self-pay | Admitting: Cardiology

## 2020-02-23 DIAGNOSIS — Z20822 Contact with and (suspected) exposure to covid-19: Secondary | ICD-10-CM | POA: Diagnosis not present

## 2020-02-23 DIAGNOSIS — I209 Angina pectoris, unspecified: Secondary | ICD-10-CM

## 2020-02-23 DIAGNOSIS — Z01812 Encounter for preprocedural laboratory examination: Secondary | ICD-10-CM | POA: Diagnosis not present

## 2020-02-23 LAB — SARS CORONAVIRUS 2 (TAT 6-24 HRS): SARS Coronavirus 2: NEGATIVE

## 2020-02-23 NOTE — Progress Notes (Signed)
bmp 

## 2020-02-24 LAB — BASIC METABOLIC PANEL
BUN/Creatinine Ratio: 14 (ref 12–28)
BUN: 9 mg/dL (ref 8–27)
CO2: 24 mmol/L (ref 20–29)
Calcium: 10 mg/dL (ref 8.7–10.3)
Chloride: 94 mmol/L — ABNORMAL LOW (ref 96–106)
Creatinine, Ser: 0.64 mg/dL (ref 0.57–1.00)
GFR calc Af Amer: 108 mL/min/{1.73_m2} (ref >59)
GFR calc non Af Amer: 93 mL/min/{1.73_m2} (ref >59)
Glucose: 99 mg/dL (ref 65–99)
Potassium: 4.4 mmol/L (ref 3.5–5.2)
Sodium: 133 mmol/L — ABNORMAL LOW (ref 134–144)

## 2020-02-24 LAB — CBC
Hematocrit: 45 % (ref 34.0–46.6)
Hemoglobin: 15.2 g/dL (ref 11.1–15.9)
MCH: 31.5 pg (ref 26.6–33.0)
MCHC: 33.8 g/dL (ref 31.5–35.7)
MCV: 93 fL (ref 79–97)
Platelets: 446 10*3/uL (ref 150–450)
RBC: 4.82 x10E6/uL (ref 3.77–5.28)
RDW: 12.1 % (ref 11.7–15.4)
WBC: 9.4 10*3/uL (ref 3.4–10.8)

## 2020-02-26 DIAGNOSIS — I209 Angina pectoris, unspecified: Secondary | ICD-10-CM | POA: Diagnosis present

## 2020-02-26 DIAGNOSIS — I200 Unstable angina: Secondary | ICD-10-CM | POA: Diagnosis present

## 2020-02-27 ENCOUNTER — Ambulatory Visit (HOSPITAL_COMMUNITY)
Admission: RE | Admit: 2020-02-27 | Discharge: 2020-02-27 | Disposition: A | Discharge status: Home / Self Care | Payer: Medicare Other | Attending: Cardiology | Admitting: Cardiology

## 2020-02-27 ENCOUNTER — Encounter (HOSPITAL_COMMUNITY)
Admission: RE | Disposition: A | Discharge status: Home / Self Care | Payer: Self-pay | Source: Home / Self Care | Attending: Cardiology

## 2020-02-27 ENCOUNTER — Other Ambulatory Visit: Payer: Self-pay

## 2020-02-27 DIAGNOSIS — Z923 Personal history of irradiation: Secondary | ICD-10-CM | POA: Diagnosis not present

## 2020-02-27 DIAGNOSIS — I6521 Occlusion and stenosis of right carotid artery: Secondary | ICD-10-CM | POA: Insufficient documentation

## 2020-02-27 DIAGNOSIS — I25119 Atherosclerotic heart disease of native coronary artery with unspecified angina pectoris: Secondary | ICD-10-CM | POA: Diagnosis not present

## 2020-02-27 DIAGNOSIS — Z87891 Personal history of nicotine dependence: Secondary | ICD-10-CM | POA: Insufficient documentation

## 2020-02-27 DIAGNOSIS — I25110 Atherosclerotic heart disease of native coronary artery with unstable angina pectoris: Secondary | ICD-10-CM

## 2020-02-27 DIAGNOSIS — I350 Nonrheumatic aortic (valve) stenosis: Secondary | ICD-10-CM | POA: Diagnosis not present

## 2020-02-27 DIAGNOSIS — F329 Major depressive disorder, single episode, unspecified: Secondary | ICD-10-CM | POA: Insufficient documentation

## 2020-02-27 DIAGNOSIS — E785 Hyperlipidemia, unspecified: Secondary | ICD-10-CM | POA: Insufficient documentation

## 2020-02-27 DIAGNOSIS — E7800 Pure hypercholesterolemia, unspecified: Secondary | ICD-10-CM

## 2020-02-27 DIAGNOSIS — Z7982 Long term (current) use of aspirin: Secondary | ICD-10-CM | POA: Insufficient documentation

## 2020-02-27 DIAGNOSIS — I25118 Atherosclerotic heart disease of native coronary artery with other forms of angina pectoris: Secondary | ICD-10-CM | POA: Diagnosis not present

## 2020-02-27 DIAGNOSIS — F419 Anxiety disorder, unspecified: Secondary | ICD-10-CM | POA: Diagnosis not present

## 2020-02-27 DIAGNOSIS — Z853 Personal history of malignant neoplasm of breast: Secondary | ICD-10-CM | POA: Insufficient documentation

## 2020-02-27 DIAGNOSIS — Z95820 Peripheral vascular angioplasty status with implants and grafts: Secondary | ICD-10-CM

## 2020-02-27 DIAGNOSIS — I200 Unstable angina: Secondary | ICD-10-CM | POA: Diagnosis present

## 2020-02-27 DIAGNOSIS — Z9221 Personal history of antineoplastic chemotherapy: Secondary | ICD-10-CM | POA: Diagnosis not present

## 2020-02-27 DIAGNOSIS — Z79899 Other long term (current) drug therapy: Secondary | ICD-10-CM | POA: Diagnosis not present

## 2020-02-27 DIAGNOSIS — I209 Angina pectoris, unspecified: Secondary | ICD-10-CM | POA: Diagnosis not present

## 2020-02-27 DIAGNOSIS — I10 Essential (primary) hypertension: Secondary | ICD-10-CM | POA: Insufficient documentation

## 2020-02-27 DIAGNOSIS — I2510 Atherosclerotic heart disease of native coronary artery without angina pectoris: Secondary | ICD-10-CM

## 2020-02-27 HISTORY — PX: LEFT HEART CATH AND CORONARY ANGIOGRAPHY: CATH118249

## 2020-02-27 LAB — POCT ACTIVATED CLOTTING TIME
Activated Clotting Time: 279 seconds
Activated Clotting Time: 279 seconds

## 2020-02-27 SURGERY — LEFT HEART CATH AND CORONARY ANGIOGRAPHY
Anesthesia: LOCAL

## 2020-02-27 MED ORDER — SODIUM CHLORIDE 0.9% FLUSH: 3.0000 mL | Freq: Two times a day (BID) | INTRAVENOUS | Status: DC

## 2020-02-27 MED ORDER — CLOPIDOGREL BISULFATE 300 MG PO TABS
ORAL_TABLET | ORAL | Status: AC
  Filled 2020-02-27: qty 1

## 2020-02-27 MED ORDER — SODIUM CHLORIDE 0.9 % WEIGHT BASED INFUSION
3.0000 mL/kg/h | INTRAVENOUS | Status: DC
  Administered 2020-02-27: 3 mL/kg/h via INTRAVENOUS

## 2020-02-27 MED ORDER — HEPARIN (PORCINE) IN NACL 1000-0.9 UT/500ML-% IV SOLN
INTRAVENOUS | Status: AC
  Filled 2020-02-27: qty 1000

## 2020-02-27 MED ORDER — ONDANSETRON HCL 4 MG/2ML IJ SOLN: 4.0000 mg | Freq: Four times a day (QID) | INTRAMUSCULAR | Status: DC | PRN

## 2020-02-27 MED ORDER — ATORVASTATIN CALCIUM 80 MG PO TABS
80.0000 mg | ORAL_TABLET | Freq: Once | ORAL | Status: AC
  Filled 2020-02-27: qty 1

## 2020-02-27 MED ORDER — IOHEXOL 350 MG/ML SOLN
INTRAVENOUS | Status: DC | PRN
  Administered 2020-02-27 (×2): 135 mL

## 2020-02-27 MED ORDER — HYDROMORPHONE HCL 1 MG/ML IJ SOLN
INTRAMUSCULAR | Status: AC
  Filled 2020-02-27: qty 0.5

## 2020-02-27 MED ORDER — SODIUM CHLORIDE 0.9 % WEIGHT BASED INFUSION: 1.0000 mL/kg/h | INTRAVENOUS | Status: DC

## 2020-02-27 MED ORDER — FAMOTIDINE IN NACL 20-0.9 MG/50ML-% IV SOLN
INTRAVENOUS | Status: AC | PRN
  Administered 2020-02-27: 20 mg via INTRAVENOUS

## 2020-02-27 MED ORDER — HEPARIN SODIUM (PORCINE) 1000 UNIT/ML IJ SOLN
INTRAMUSCULAR | Status: AC
  Filled 2020-02-27: qty 1

## 2020-02-27 MED ORDER — VERAPAMIL HCL 2.5 MG/ML IV SOLN
INTRAVENOUS | Status: AC
  Filled 2020-02-27: qty 2

## 2020-02-27 MED ORDER — FAMOTIDINE IN NACL 20-0.9 MG/50ML-% IV SOLN
INTRAVENOUS | Status: AC
  Filled 2020-02-27: qty 50

## 2020-02-27 MED ORDER — NITROGLYCERIN 1 MG/10 ML FOR IR/CATH LAB
INTRA_ARTERIAL | Status: AC
  Filled 2020-02-27: qty 10

## 2020-02-27 MED ORDER — CLOPIDOGREL BISULFATE 300 MG PO TABS
ORAL_TABLET | ORAL | Status: DC | PRN
  Administered 2020-02-27: 600 mg via ORAL

## 2020-02-27 MED ORDER — DIAZEPAM 5 MG PO TABS
ORAL_TABLET | ORAL | Status: AC
  Filled 2020-02-27: qty 1

## 2020-02-27 MED ORDER — LIDOCAINE HCL (PF) 1 % IJ SOLN
INTRAMUSCULAR | Status: AC
  Filled 2020-02-27: qty 30

## 2020-02-27 MED ORDER — HEPARIN SODIUM (PORCINE) 1000 UNIT/ML IJ SOLN
INTRAMUSCULAR | Status: DC | PRN
  Administered 2020-02-27: 5000 [IU] via INTRAVENOUS
  Administered 2020-02-27: 3000 [IU] via INTRAVENOUS
  Administered 2020-02-27: 6000 [IU] via INTRAVENOUS

## 2020-02-27 MED ORDER — SODIUM CHLORIDE 0.9 % IV SOLN: 250.0000 mL | INTRAVENOUS | Status: DC | PRN

## 2020-02-27 MED ORDER — VERAPAMIL HCL 2.5 MG/ML IV SOLN
INTRAVENOUS | Status: DC | PRN
  Administered 2020-02-27: 10 mL via INTRA_ARTERIAL

## 2020-02-27 MED ORDER — HEPARIN (PORCINE) IN NACL 1000-0.9 UT/500ML-% IV SOLN
INTRAVENOUS | Status: DC | PRN
  Administered 2020-02-27 (×2): 500 mL

## 2020-02-27 MED ORDER — HYDROMORPHONE HCL 1 MG/ML IJ SOLN
INTRAMUSCULAR | Status: DC | PRN
  Administered 2020-02-27 (×3): 0.5 mg via INTRAVENOUS

## 2020-02-27 MED ORDER — DIAZEPAM 2 MG PO TABS
ORAL_TABLET | ORAL | Status: DC | PRN
  Administered 2020-02-27: 5 mg via ORAL

## 2020-02-27 MED ORDER — CLOPIDOGREL BISULFATE 75 MG PO TABS: 75.0000 mg | ORAL_TABLET | Freq: Every day | ORAL | 0 refills | Status: DC

## 2020-02-27 MED ORDER — HYDRALAZINE HCL 20 MG/ML IJ SOLN: 5.0000 mg | INTRAMUSCULAR | Status: AC | PRN

## 2020-02-27 MED ORDER — METOPROLOL SUCCINATE ER 50 MG PO TB24: 50.0000 mg | ORAL_TABLET | Freq: Every day | ORAL | 1 refills | Status: DC

## 2020-02-27 MED ORDER — ANGIOPLASTY BOOK
Status: AC
  Filled 2020-02-27: qty 1

## 2020-02-27 MED ORDER — SODIUM CHLORIDE 0.9% FLUSH: 3.0000 mL | INTRAVENOUS | Status: DC | PRN

## 2020-02-27 MED ORDER — NITROGLYCERIN 1 MG/10 ML FOR IR/CATH LAB
INTRA_ARTERIAL | Status: DC | PRN
  Administered 2020-02-27: 200 ug

## 2020-02-27 MED ORDER — ASPIRIN EC 81 MG PO TBEC: 81.0000 mg | DELAYED_RELEASE_TABLET | Freq: Every day | ORAL | 0 refills | Status: DC

## 2020-02-27 MED ORDER — ASPIRIN 81 MG PO CHEW: 81.0000 mg | CHEWABLE_TABLET | ORAL | Status: DC

## 2020-02-27 MED ORDER — ALPRAZOLAM 0.5 MG PO TABS
0.5000 mg | ORAL_TABLET | Freq: Once | ORAL | Status: AC
  Administered 2020-02-27: 0.5 mg via ORAL
  Filled 2020-02-27: qty 1

## 2020-02-27 MED ORDER — LIDOCAINE HCL (PF) 1 % IJ SOLN
INTRAMUSCULAR | Status: DC | PRN
  Administered 2020-02-27: 2 mL

## 2020-02-27 MED ORDER — HYDROMORPHONE HCL 1 MG/ML IJ SOLN
INTRAMUSCULAR | Status: AC
Start: 2020-02-27 — End: ?
  Filled 2020-02-27: qty 0.5

## 2020-02-27 MED ORDER — ATORVASTATIN CALCIUM 10 MG PO TABS: 10.0000 mg | ORAL_TABLET | Freq: Every day | ORAL | 0 refills | Status: DC

## 2020-02-27 MED FILL — CLOPIDOGREL 75 MG TABLET: 75 | 30 days supply | Qty: 30 | Fill #0

## 2020-02-27 SURGICAL SUPPLY — 16 items
BALLN WOLVERINE 2.50X10 (BALLOONS) ×2
BALLOON WOLVERINE 2.50X10 (BALLOONS) ×1 IMPLANT
CATH LAUNCHER 6FR EBU3.5 (CATHETERS) ×2 IMPLANT
CATH OPTITORQUE TIG 4.0 5F (CATHETERS) ×2 IMPLANT
DEVICE RAD COMP TR BAND LRG (VASCULAR PRODUCTS) ×2 IMPLANT
GLIDESHEATH SLEND A-KIT 6F 22G (SHEATH) ×2 IMPLANT
GUIDEWIRE INQWIRE 1.5J.035X260 (WIRE) ×1 IMPLANT
INQWIRE 1.5J .035X260CM (WIRE) ×2
KIT ENCORE 26 ADVANTAGE (KITS) ×2 IMPLANT
KIT HEART LEFT (KITS) ×2 IMPLANT
PACK CARDIAC CATHETERIZATION (CUSTOM PROCEDURE TRAY) ×2 IMPLANT
STENT RESOLUTE ONYX 3.0X12 (Permanent Stent) ×2 IMPLANT
STENT RESOLUTE ONYX 3.0X18 (Permanent Stent) ×2 IMPLANT
TRANSDUCER W/STOPCOCK (MISCELLANEOUS) ×2 IMPLANT
TUBING CIL FLEX 10 FLL-RA (TUBING) ×2 IMPLANT
WIRE COUGAR XT STRL 190CM (WIRE) ×2 IMPLANT

## 2020-02-27 NOTE — H&P (View-Only) (Signed)
Primary Physician/Referring:  Madison Agreste, MD  Patient ID: Madison West, female    DOB: January 07, 1956, 64 y.o.   MRN: [id number]  No chief complaint on file.   HPI: Madison West  is a 64 y.o. female female  with history of PSVT on 08/04/2016,  terminated with adenosine, PSVT post CEA  On 06/09/2020 and also asymptomatic sinus bradycardia. She has severe anxiety and depression, history of breast cancer status post radiation therapy and chemotherapy in 2009, hypertension, mild aortic valve stenosis, hyperlipidemia, palpitations, asymptomatic right carotid artery stenosis SP right carotid endarterectomy on 06/09/2019.  I had seen her in July 2021 when she presented with symptoms suggestive of angina pectoris.  I performed echocardiogram and stress test which was abnormal nuclear stress test which was extensively discussed with the patient.  In view of abnormal echocardiogram and also nuclear stress test, ongoing symptoms of chest pain, she was recommended cardiac catheterization.  Coronary CTA was also discussed with the patient.  With high likelihood of underlying CAD, in a patient with untreated hyperlipidemia, history of radiation therapy to her chest, proximal major vessel disease need to be excluded hence after discussions we proceed with cardiac catheterization strategy.  He now presents to the hospital for elective procedure.  No new symptoms, has used sublingual nitroglycerin and states that she still continues to have exertional chest tightness and is extremely anxious about her situation.  Past Medical History:  Diagnosis Date  . Anxiety   . Arthritis    low back and hip pain intermittent  . Breast cancer ([place]) 06/02/07   r breast -surgery ,radiaology. chemotherapy  . Carpal tunnel syndrome    right hand  . Colon polyps   . Complication of anesthesia    Fentanyl, Versed-makes extra hyper, bradycardia x 1 in PACU, [facility] (08/15/11 cardiology felt neostigmine may have  resulted in AV nodal block)   . Coronary artery disease   . Depression    denies  . Dysplasia of vulva   . Hypertension   . Palpitations    PSVT, s/p adenosine 08/04/16  . S/P breast lumpectomy 07/04/07   R breast  . S/P radiation therapy 2009    Past Surgical History:  Procedure Laterality Date  . APPENDECTOMY     age 9  . CESAREAN SECTION     x 2  . COLONOSCOPY W/ POLYPECTOMY    . COLONOSCOPY WITH PROPOFOL N/A 04/27/2016   Procedure: COLONOSCOPY WITH PROPOFOL;  Surgeon: Garlan Fair, MD;  Location: [facility];  Service: Endoscopy;  Laterality: N/A;  . DILATION AND CURETTAGE OF UTERUS     multiple  . ENDARTERECTOMY Right 06/09/2019  . ENDARTERECTOMY Right 06/09/2019   Procedure: ENDARTERECTOMY CAROTID RIGHT;  Surgeon: Elam Dutch, MD;  Location: [place];  Service: Vascular;  Laterality: Right;  . INTRAUTERINE DEVICE INSERTION    . IUD REMOVAL    . MASTECTOMY PARTIAL / LUMPECTOMY W/ AXILLARY LYMPHADENECTOMY Right    lumpectomy and lymph nodes removed  . PATCH ANGIOPLASTY Right 06/09/2019   Procedure: Patch Angioplasty of right carotid artery using hemashield paltinum finesse patch;  Surgeon: Elam Dutch, MD;  Location: [facility] OR;  Service: Vascular;  Laterality: Right;  . TUBAL LIGATION    . VULVA SURGERY     Multiple times for dysplasia    Social History   Tobacco Use  . Smoking status: Former Smoker    Packs/day: 0.75    Years: 20.00    Pack years: 15.00  Types: Cigarettes    Quit date: 05/06/1997    Years since quitting: 22.8  . Smokeless tobacco: Never Used  Substance Use Topics  . Alcohol use: Yes    Alcohol/week: 0.0 standard drinks    Comment: occ   Marital Status: Widowed   No current facility-administered medications on file prior to encounter.   Current Outpatient Medications on File Prior to Encounter  Medication Sig Dispense Refill  . alprazolam (XANAX) 2 MG tablet 1/2-1 tid prn anxiety. (Patient taking differently: Take 1-2 mg by  mouth in the morning, at noon, and at bedtime. ) 270 tablet 0  . aspirin 81 MG chewable tablet Chew 81 mg by mouth daily.     . Garlic 267 MG CAPS Take 500 mg by mouth in the morning and at bedtime.     Marland Kitchen losartan-hydrochlorothiazide (HYZAAR) 50-12.5 MG tablet Take 0.5 tablets by mouth daily. 45 tablet 1  . Omega-3 Fatty Acids (FISH OIL) 500 MG CAPS Take 500 mg by mouth in the morning and at bedtime.     . cyclobenzaprine (FLEXERIL) 5 MG tablet Take 1-2 tablets (5-10 mg total) by mouth 3 (three) times daily as needed for muscle spasms. 30 tablet 0   Review of Systems  Constitutional: Positive for malaise/fatigue.  Cardiovascular: Positive for chest pain and dyspnea on exertion. Negative for leg swelling.  Musculoskeletal: Positive for joint pain.  Gastrointestinal: Negative for melena.  Psychiatric/Behavioral: The patient is nervous/anxious.   All other systems reviewed and are negative.  Objective  Blood pressure (!) 143/97, pulse 75, temperature 97.9 F (36.6 C), temperature source Oral, resp. rate 18, height 5\' 8"  (1.727 m), weight 86.2 kg, SpO2 98 %. Body mass index is 28.89 kg/m.   Vitals with BMI 02/27/2020 01/19/2020 01/11/2020  Height 5\' 8"  5\' 8"  -  Weight 190 lbs 195 lbs 13 oz -  BMI 12.4 58.09 -  Systolic 983 382 505  Diastolic 97 85 76  Pulse 75 103 83    Physical Exam Constitutional:      General: She is not in acute distress.    Appearance: Normal appearance. She is well-developed.     Comments: Mildly obese  HENT:     Head: Atraumatic.  Neck:     Comments: Right carotid endarterectomy scar Cardiovascular:     Rate and Rhythm: Normal rate and regular rhythm.     Pulses: Intact distal pulses.          Carotid pulses are on the right side with bruit and on the left side with bruit.    Heart sounds: Murmur heard.  Midsystolic murmur is present with a grade of 3/6 at the upper right sternal border.      Comments: Right CEA scar noted. No JVD, no leg edema.    Pulmonary:     Effort: Pulmonary effort is normal. No accessory muscle usage or respiratory distress.     Breath sounds: Normal breath sounds.  Abdominal:     General: Bowel sounds are normal.     Palpations: Abdomen is soft.  Neurological:     Mental Status: She is alert and oriented to person, place, and time.    Laboratory examination:   CMP Latest Ref Rng & Units 02/23/2020 01/19/2020 06/10/2019  Glucose 65 - 99 mg/dL 99 112(H) 145(H)  BUN 8 - 27 mg/dL 9 11 10   Creatinine 0.57 - 1.00 mg/dL 0.64 0.67 0.68  Sodium 134 - 144 mmol/L 133(L) 135 137  Potassium 3.5 - 5.2 mmol/L  4.4 4.2 3.7  Chloride 96 - 106 mmol/L 94(L) 97 102  CO2 20 - 29 mmol/L 24 25 25   Calcium 8.7 - 10.3 mg/dL 10.0 10.0 9.1  Total Protein 6.0 - 8.5 g/dL - 7.0 -  Total Bilirubin 0.0 - 1.2 mg/dL - 0.5 -  Alkaline Phos 48 - 121 IU/L - 64 -  AST 0 - 40 IU/L - 18 -  ALT 0 - 32 IU/L - 17 -   CBC Latest Ref Rng & Units 02/23/2020 01/19/2020 06/10/2019  WBC 3.4 - 10.8 x10E3/uL 9.4 7.3 15.3(H)  Hemoglobin 11.1 - 15.9 g/dL 15.2 15.4 14.5  Hematocrit 34.0 - 46.6 % 45.0 45.7 42.9  Platelets 150 - 450 x10E3/uL 446 394 422(H)   Lipid Panel     Component Value Date/Time   CHOL 253 (H) 01/19/2020 1434   TRIG 138 01/19/2020 1434   HDL 60 01/19/2020 1434   CHOLHDL 4.2 01/19/2020 1434   CHOLHDL 6.7 12/17/2011 0949   VLDL 48 (H) 12/17/2011 0949   LDLCALC 168 (H) 01/19/2020 1434   HEMOGLOBIN A1C Lab Results  Component Value Date   HGBA1C 5.8 (H) 10/15/2017   MPG 114 10/06/2011   TSH No results for input(s): TSH in the last 8760 hours. Cardiac Studies:   Carotid artery duplex  11/01/2019:  Right Carotid: Velocities in the right ICA are consistent with a 1-39%  stenosis.  Status post endarterectomy.   Left Carotid: Velocities in the left ICA are consistent with a 1-39% stenosis.  Non-hemodynamically significant plaque <50% noted in the CCA.   Vertebrals: Right vertebral artery demonstrates antegrade flow. Left  vertebral  artery was not visualized.  Subclavians: Normal flow hemodynamics were seen in bilateral subclavian  arteries.   Exercise Myoview stress test 01/31/2020: Exercise nuclear stress test was performed using Bruce protocol. Patient reached 6.0 METS, and 88% of age predicted maximum heart rate. Exercise capacity was low. 8/10 chest pain reported. Heart rate and hemodynamic response were normal. Stress EKG shows minimal ST elevation in lead aVR, horizontal ST depressions 1 mm in leds V4-V6, and frequent PVC's. Normal myocardial perfusion. Stress LVEF 60%.  In presence of exertional chest pain, recommend clinical correlation.  Echocardiogram 01/18/2020:  Normal LV systolic function with visual EF 55-60%. Mild to moderate left  ventricular hypertrophy. Left ventricle cavity is normal in size. Left ventricle regional wall motion findings: Mid inferior, Mid inferoseptal  and Apical septal hypokinesis. Calculated EF 55%.  Left atrial cavity is moderately dilated.  Mild (Grade I) aortic regurgitation.  Peak velocity 2.55m/s, mean gradient 17.1mmHg, Dimensional index 0.40,  findings suggestive of mild to moderate aortic stenosis.  Mild tricuspid regurgitation.  Trivial pericardial effusion with no hemodynamic compromise.  Compared to prior study dated 03/08/2018: LVEF remains preserved, now  RWMAs as noted above, and AS is now mild to moderate.   EKG:  EKG 01/26/2018: Normal sinus rhythm at 89 bpm with first-degree AV block and borderline right atrial enlargement. Normal interval, no evidence of ischemia. No significant change compared to EKG March 2018.  Assessment   1.  Angina pectoris 2.  Hyperlipidemia, patient refuses statin therapy and also therapy with PCSK9 inhibitors 3.  Carotid endarterectomy status, right for high-grade stenosis  Recommendations:   Sunshyne Horvath  is a 64 y.o.  female  with history of PSVT on 08/04/2016,  terminated with adenosine, PSVT post CEA  On 06/09/2020  and also asymptomatic sinus bradycardia. She has severe anxiety and depression, history of breast cancer status post  radiation therapy and chemotherapy in 2009, hypertension, mild aortic valve stenosis, hyperlipidemia, palpitations, asymptomatic right carotid artery stenosis SP right carotid endarterectomy on 06/09/2019.  I have reviewed the results of the stress test with the patient and her daughter over the telephone. Schedule for cardiac catheterization, and possible angioplasty. We discussed regarding risks, benefits, alternatives to this including stress testing, CTA and continued medical therapy. Patient wants to proceed. Understands <1-2% risk of death, stroke, MI, urgent CABG, bleeding, infection, renal failure but not limited to these.  I gave her option to come in to discuss further as well.   I suspect multivessel disease. She continue to not wanting to take statins and is very difficult to manage her risk factors. She does not want to consider PCSK9 inhibitors as well.   She does not want Versed or fentanyl as she does not react to them and becomes anxious and "hyper". Will do under Benadryl sedation.   Adrian Prows, MD, [facility] 02/27/2020, 8:31 AM Office: [phone]

## 2020-02-27 NOTE — Progress Notes (Signed)
[id number] Discussed with pt the importance of plavix with stent. Reviewed NTG use. Pt stated she does not want to take NTG if needed due to headache. Advised to call 911 if CP does not subside quickly. Gave heart healthy diet and written walking instructions. Discussed CRP 2 and referred to [facility] program. Pt voiced understanding of ed. Graylon Good RN BSN 02/27/2020 12:16 PM

## 2020-02-27 NOTE — Progress Notes (Addendum)
Primary Physician/Referring:  [name], MD  Patient ID: Madison West, female    DOB: 02/26/1956, 64 y.o.   MRN: [id number]  No chief complaint on file.   HPI: Beyounce Dickens  is a 64 y.o. female female  with history of PSVT on 08/04/2016,  terminated with adenosine, PSVT post CEA  On 06/09/2020 and also asymptomatic sinus bradycardia. She has severe anxiety and depression, history of breast cancer status post radiation therapy and chemotherapy in 2009, hypertension, mild aortic valve stenosis, hyperlipidemia, palpitations, asymptomatic right carotid artery stenosis SP right carotid endarterectomy on 06/09/2019.  I had seen her in July 2021 when she presented with symptoms suggestive of angina pectoris.  I performed echocardiogram and stress test which was abnormal nuclear stress test which was extensively discussed with the patient.  In view of abnormal echocardiogram and also nuclear stress test, ongoing symptoms of chest pain, she was recommended cardiac catheterization.  Coronary CTA was also discussed with the patient.  With high likelihood of underlying CAD, in a patient with untreated hyperlipidemia, history of radiation therapy to her chest, proximal major vessel disease need to be excluded hence after discussions we proceed with cardiac catheterization strategy.  He now presents to the hospital for elective procedure.  No new symptoms, has used sublingual nitroglycerin and states that she still continues to have exertional chest tightness and is extremely anxious about her situation.  Past Medical History:  Diagnosis Date  . Anxiety   . Arthritis    low back and hip pain intermittent  . Breast cancer ([place]) 06/02/07   r breast -surgery ,radiaology. chemotherapy  . Carpal tunnel syndrome    right hand  . Colon polyps   . Complication of anesthesia    Fentanyl, Versed-makes extra hyper, bradycardia x 1 in PACU, [facility] (08/15/11 cardiology felt neostigmine may have  resulted in AV nodal block)   . Coronary artery disease   . Depression    denies  . Dysplasia of vulva   . Hypertension   . Palpitations    PSVT, s/p adenosine 08/04/16  . S/P breast lumpectomy 07/04/07   R breast  . S/P radiation therapy 2009    Past Surgical History:  Procedure Laterality Date  . APPENDECTOMY     age 33  . CESAREAN SECTION     x 2  . COLONOSCOPY W/ POLYPECTOMY    . COLONOSCOPY WITH PROPOFOL N/A 04/27/2016   Procedure: COLONOSCOPY WITH PROPOFOL;  Surgeon: Garlan Fair, MD;  Location: [facility];  Service: Endoscopy;  Laterality: N/A;  . DILATION AND CURETTAGE OF UTERUS     multiple  . ENDARTERECTOMY Right 06/09/2019  . ENDARTERECTOMY Right 06/09/2019   Procedure: ENDARTERECTOMY CAROTID RIGHT;  Surgeon: Elam Dutch, MD;  Location: [place];  Service: Vascular;  Laterality: Right;  . INTRAUTERINE DEVICE INSERTION    . IUD REMOVAL    . MASTECTOMY PARTIAL / LUMPECTOMY W/ AXILLARY LYMPHADENECTOMY Right    lumpectomy and lymph nodes removed  . PATCH ANGIOPLASTY Right 06/09/2019   Procedure: Patch Angioplasty of right carotid artery using hemashield paltinum finesse patch;  Surgeon: Elam Dutch, MD;  Location: [facility] OR;  Service: Vascular;  Laterality: Right;  . TUBAL LIGATION    . VULVA SURGERY     Multiple times for dysplasia    Social History   Tobacco Use  . Smoking status: Former Smoker    Packs/day: 0.75    Years: 20.00    Pack years: 15.00  Types: Cigarettes    Quit date: 05/06/1997    Years since quitting: 22.8  . Smokeless tobacco: Never Used  Substance Use Topics  . Alcohol use: Yes    Alcohol/week: 0.0 standard drinks    Comment: occ   Marital Status: Widowed   No current facility-administered medications on file prior to encounter.   Current Outpatient Medications on File Prior to Encounter  Medication Sig Dispense Refill  . alprazolam (XANAX) 2 MG tablet 1/2-1 tid prn anxiety. (Patient taking differently: Take 1-2 mg by  mouth in the morning, at noon, and at bedtime. ) 270 tablet 0  . aspirin 81 MG chewable tablet Chew 81 mg by mouth daily.     . Garlic 341 MG CAPS Take 500 mg by mouth in the morning and at bedtime.     Marland Kitchen losartan-hydrochlorothiazide (HYZAAR) 50-12.5 MG tablet Take 0.5 tablets by mouth daily. 45 tablet 1  . Omega-3 Fatty Acids (FISH OIL) 500 MG CAPS Take 500 mg by mouth in the morning and at bedtime.     . cyclobenzaprine (FLEXERIL) 5 MG tablet Take 1-2 tablets (5-10 mg total) by mouth 3 (three) times daily as needed for muscle spasms. 30 tablet 0   Review of Systems  Constitutional: Positive for malaise/fatigue.  Cardiovascular: Positive for chest pain and dyspnea on exertion. Negative for leg swelling.  Musculoskeletal: Positive for joint pain.  Gastrointestinal: Negative for melena.  Psychiatric/Behavioral: The patient is nervous/anxious.   All other systems reviewed and are negative.  Objective  Blood pressure (!) 143/97, pulse 75, temperature 97.9 F (36.6 C), temperature source Oral, resp. rate 18, height 5\' 8"  (1.727 m), weight 86.2 kg, SpO2 98 %. Body mass index is 28.89 kg/m.   Vitals with BMI 02/27/2020 01/19/2020 01/11/2020  Height 5\' 8"  5\' 8"  -  Weight 190 lbs 195 lbs 13 oz -  BMI 96.2 22.97 -  Systolic 989 211 941  Diastolic 97 85 76  Pulse 75 103 83    Physical Exam Constitutional:      General: She is not in acute distress.    Appearance: Normal appearance. She is well-developed.     Comments: Mildly obese  HENT:     Head: Atraumatic.  Neck:     Comments: Right carotid endarterectomy scar Cardiovascular:     Rate and Rhythm: Normal rate and regular rhythm.     Pulses: Intact distal pulses.          Carotid pulses are on the right side with bruit and on the left side with bruit.    Heart sounds: Murmur heard.  Midsystolic murmur is present with a grade of 3/6 at the upper right sternal border.      Comments: Right CEA scar noted. No JVD, no leg edema.    Pulmonary:     Effort: Pulmonary effort is normal. No accessory muscle usage or respiratory distress.     Breath sounds: Normal breath sounds.  Abdominal:     General: Bowel sounds are normal.     Palpations: Abdomen is soft.  Neurological:     Mental Status: She is alert and oriented to person, place, and time.    Laboratory examination:   CMP Latest Ref Rng & Units 02/23/2020 01/19/2020 06/10/2019  Glucose 65 - 99 mg/dL 99 112(H) 145(H)  BUN 8 - 27 mg/dL 9 11 10   Creatinine 0.57 - 1.00 mg/dL 0.64 0.67 0.68  Sodium 134 - 144 mmol/L 133(L) 135 137  Potassium 3.5 - 5.2 mmol/L  4.4 4.2 3.7  Chloride 96 - 106 mmol/L 94(L) 97 102  CO2 20 - 29 mmol/L 24 25 25   Calcium 8.7 - 10.3 mg/dL 10.0 10.0 9.1  Total Protein 6.0 - 8.5 g/dL - 7.0 -  Total Bilirubin 0.0 - 1.2 mg/dL - 0.5 -  Alkaline Phos 48 - 121 IU/L - 64 -  AST 0 - 40 IU/L - 18 -  ALT 0 - 32 IU/L - 17 -   CBC Latest Ref Rng & Units 02/23/2020 01/19/2020 06/10/2019  WBC 3.4 - 10.8 x10E3/uL 9.4 7.3 15.3(H)  Hemoglobin 11.1 - 15.9 g/dL 15.2 15.4 14.5  Hematocrit 34.0 - 46.6 % 45.0 45.7 42.9  Platelets 150 - 450 x10E3/uL 446 394 422(H)   Lipid Panel     Component Value Date/Time   CHOL 253 (H) 01/19/2020 1434   TRIG 138 01/19/2020 1434   HDL 60 01/19/2020 1434   CHOLHDL 4.2 01/19/2020 1434   CHOLHDL 6.7 12/17/2011 0949   VLDL 48 (H) 12/17/2011 0949   LDLCALC 168 (H) 01/19/2020 1434   HEMOGLOBIN A1C Lab Results  Component Value Date   HGBA1C 5.8 (H) 10/15/2017   MPG 114 10/06/2011   TSH No results for input(s): TSH in the last 8760 hours. Cardiac Studies:   Carotid artery duplex  11/01/2019:  Right Carotid: Velocities in the right ICA are consistent with a 1-39%  stenosis.  Status post endarterectomy.   Left Carotid: Velocities in the left ICA are consistent with a 1-39% stenosis.  Non-hemodynamically significant plaque <50% noted in the CCA.   Vertebrals: Right vertebral artery demonstrates antegrade flow. Left  vertebral  artery was not visualized.  Subclavians: Normal flow hemodynamics were seen in bilateral subclavian  arteries.   Exercise Myoview stress test 01/31/2020: Exercise nuclear stress test was performed using Bruce protocol. Patient reached 6.0 METS, and 88% of age predicted maximum heart rate. Exercise capacity was low. 8/10 chest pain reported. Heart rate and hemodynamic response were normal. Stress EKG shows minimal ST elevation in lead aVR, horizontal ST depressions 1 mm in leds V4-V6, and frequent PVC's. Normal myocardial perfusion. Stress LVEF 60%.  In presence of exertional chest pain, recommend clinical correlation.  Echocardiogram 01/18/2020:  Normal LV systolic function with visual EF 55-60%. Mild to moderate left  ventricular hypertrophy. Left ventricle cavity is normal in size. Left ventricle regional wall motion findings: Mid inferior, Mid inferoseptal  and Apical septal hypokinesis. Calculated EF 55%.  Left atrial cavity is moderately dilated.  Mild (Grade I) aortic regurgitation.  Peak velocity 2.3m/s, mean gradient 17.87mmHg, Dimensional index 0.40,  findings suggestive of mild to moderate aortic stenosis.  Mild tricuspid regurgitation.  Trivial pericardial effusion with no hemodynamic compromise.  Compared to prior study dated 03/08/2018: LVEF remains preserved, now  RWMAs as noted above, and AS is now mild to moderate.   EKG:  EKG 01/26/2018: Normal sinus rhythm at 89 bpm with first-degree AV block and borderline right atrial enlargement. Normal interval, no evidence of ischemia. No significant change compared to EKG March 2018.  Assessment   1.  Angina pectoris 2.  Hyperlipidemia, patient refuses statin therapy and also therapy with PCSK9 inhibitors 3.  Carotid endarterectomy status, right for high-grade stenosis  Recommendations:   Tifanny Dollens  is a 64 y.o.  female  with history of PSVT on 08/04/2016,  terminated with adenosine, PSVT post CEA  On 06/09/2020  and also asymptomatic sinus bradycardia. She has severe anxiety and depression, history of breast cancer status post  radiation therapy and chemotherapy in 2009, hypertension, mild aortic valve stenosis, hyperlipidemia, palpitations, asymptomatic right carotid artery stenosis SP right carotid endarterectomy on 06/09/2019.  I have reviewed the results of the stress test with the patient and her daughter over the telephone. Schedule for cardiac catheterization, and possible angioplasty. We discussed regarding risks, benefits, alternatives to this including stress testing, CTA and continued medical therapy. Patient wants to proceed. Understands <1-2% risk of death, stroke, MI, urgent CABG, bleeding, infection, renal failure but not limited to these.  I gave her option to come in to discuss further as well.   I suspect multivessel disease. She continue to not wanting to take statins and is very difficult to manage her risk factors. She does not want to consider PCSK9 inhibitors as well.   She does not want Versed or fentanyl as she does not react to them and becomes anxious and "hyper". Will do under Benadryl sedation.   Adrian Prows, MD, [facility] 02/27/2020, 8:31 AM Office: [phone]

## 2020-02-27 NOTE — CV Procedure (Addendum)
   Mid circumflex with a diffusely diseased 80% stenosis.  Small vessel.  Medical therapy recommended.   Mild disease in the right coronary artery.   Mild diffuse disease in the LAD.    3.0 x 18 and 3.0 x 12 mm resolute Onyx DES overlapping stents in the ramus intermediate.   Adrian Prows, MD, [facility] 02/27/2020, 10:24 AM Office: [phone]

## 2020-02-27 NOTE — Interval H&P Note (Signed)
History and Physical Interval Note:  02/27/2020 8:50 AM  Madison West  has presented today for surgery, with the diagnosis of Coronary Artery Disease.  The various methods of treatment have been discussed with the patient and family. After consideration of risks, benefits and other options for treatment, the patient has consented to  Procedure(s): LEFT HEART CATH AND CORONARY ANGIOGRAPHY (N/A) and possible coronary intervention as a surgical intervention.  The patient's history has been reviewed, patient examined, no change in status, stable for surgery.  I have reviewed the patient's chart and labs.  Questions were answered to the patient's satisfaction.   Cath Lab Visit (complete for each Cath Lab visit)  Clinical Evaluation Leading to the Procedure:   ACS: No.  Non-ACS:    Anginal Classification: CCS III  Anti-ischemic medical therapy: Minimal Therapy (1 class of medications)  Non-Invasive Test Results: High-risk stress test findings: cardiac mortality >3%/year with associated wall motion abnormality on Echo  Prior CABG: No previous CABG  Adrian Prows

## 2020-02-27 NOTE — Progress Notes (Addendum)
Dr Einar Gip in and notified PR interval has increased per telemetry tech- no new orders and ok to d/c home per Dr Einar Gip

## 2020-02-27 NOTE — Discharge Instructions (Signed)
Metoprolol Tablets What is this medicine? METOPROLOL (me TOE proe lole) is a beta blocker. It decreases the amount of work your heart has to do and helps your heart beat regularly. It is used to treat high blood pressure and/or prevent chest pain (also called angina). It is also used after a heart attack to prevent a second one. This medicine may be used for other purposes; ask your health care provider or pharmacist if you have questions. COMMON BRAND NAME(S): Lopressor What should I tell my health care provider before I take this medicine? They need to know if you have any of these conditions:  diabetes  heart or vessel disease like slow heart rate, worsening heart failure, heart block, sick sinus syndrome or Raynaud's disease  kidney disease  liver disease  lung or breathing disease, like asthma or emphysema  pheochromocytoma  thyroid disease  an unusual or allergic reaction to metoprolol, other beta-blockers, medicines, foods, dyes, or preservatives  pregnant or trying to get pregnant  breast-feeding How should I use this medicine? Take this drug by mouth with water. Take it as directed on the prescription label at the same time every day. You can take it with or without food. You should always take it the same way. Keep taking it unless your health care provider tells you to stop. Talk to your health care provider about the use of this drug in children. Special care may be needed. Overdosage: If you think you have taken too much of this medicine contact a poison control center or emergency room at once. NOTE: This medicine is only for you. Do not share this medicine with others. What if I miss a dose? If you miss a dose, take it as soon as you can. If it is almost time for your next dose, take only that dose. Do not take double or extra doses. What may interact with this medicine? This medicine may interact with the following medications:  certain medicines for blood  pressure, heart disease, irregular heart beat  certain medicines for depression like monoamine oxidase (MAO) inhibitors, fluoxetine, or paroxetine  clonidine  dobutamine  epinephrine  isoproterenol  reserpine This list may not describe all possible interactions. Give your health care provider a list of all the medicines, herbs, non-prescription drugs, or dietary supplements you use. Also tell them if you smoke, drink alcohol, or use illegal drugs. Some items may interact with your medicine. What should I watch for while using this medicine? Visit your doctor or health care professional for regular check ups. Contact your doctor right away if your symptoms worsen. Check your blood pressure and pulse rate regularly. Ask your health care professional what your blood pressure and pulse rate should be, and when you should contact them. You may get drowsy or dizzy. Do not drive, use machinery, or do anything that needs mental alertness until you know how this medicine affects you. Do not sit or stand up quickly, especially if you are an older patient. This reduces the risk of dizzy or fainting spells. Contact your doctor if these symptoms continue. Alcohol may interfere with the effect of this medicine. Avoid alcoholic drinks. This medicine may increase blood sugar. Ask your healthcare provider if changes in diet or medicines are needed if you have diabetes. What side effects may I notice from receiving this medicine? Side effects that you should report to your doctor or health care professional as soon as possible:  allergic reactions like skin rash, itching or hives  cold or numb hands or feet  depression  difficulty breathing  faint  fever with sore throat  irregular heartbeat, chest pain  rapid weight gain   signs and symptoms of high blood sugar such as being more thirsty or hungry or having to urinate more than normal. You may also feel very tired or have blurry  vision.  swollen legs or ankles Side effects that usually do not require medical attention (report to your doctor or health care professional if they continue or are bothersome):  anxiety or nervousness  change in sex drive or performance  dry skin  headache  nightmares or trouble sleeping  short term memory loss  stomach upset or diarrhea This list may not describe all possible side effects. Call your doctor for medical advice about side effects. You may report side effects to FDA at 1-800-FDA-1088. Where should I keep my medicine? Keep out of the reach of children and pets. Store at room temperature between 15 and 30 degrees C (59 and 86 degrees F). Protect from moisture. Keep the container tightly closed. Throw away any unused drug after the expiration date. NOTE: This sheet is a summary. It may not cover all possible information. If you have questions about this medicine, talk to your doctor, pharmacist, or health care provider.  2020 Elsevier/Gold Standard (2019-01-19 17:21:17)    Atorvastatin tablets What is this medicine? ATORVASTATIN (a TORE va sta tin) is known as a HMG-CoA reductase inhibitor or 'statin'. It lowers the level of cholesterol and triglycerides in the blood. This drug may also reduce the risk of heart attack, stroke, or other health problems in patients with risk factors for heart disease. Diet and lifestyle changes are often used with this drug. This medicine may be used for other purposes; ask your health care provider or pharmacist if you have questions. COMMON BRAND NAME(S): Lipitor What should I tell my health care provider before I take this medicine? They need to know if you have any of these conditions:  diabetes  if you often drink alcohol  history of stroke  kidney disease  liver disease  muscle aches or weakness  thyroid disease  an unusual or allergic reaction to atorvastatin, other medicines, foods, dyes, or  preservatives  pregnant or trying to get pregnant  breast-feeding How should I use this medicine? Take this medicine by mouth with a glass of water. Follow the directions on the prescription label. You can take it with or without food. If it upsets your stomach, take it with food. Do not take with grapefruit juice. Take your medicine at regular intervals. Do not take it more often than directed. Do not stop taking except on your doctor's advice. Talk to your pediatrician regarding the use of this medicine in children. While this drug may be prescribed for children as young as 10 for selected conditions, precautions do apply. Overdosage: If you think you have taken too much of this medicine contact a poison control center or emergency room at once. NOTE: This medicine is only for you. Do not share this medicine with others. What if I miss a dose? If you miss a dose, take it as soon as you can. If your next dose is to be taken in less than 12 hours, then do not take the missed dose. Take the next dose at your regular time. Do not take double or extra doses. What may interact with this medicine? Do not take this medicine with any of the following medications:  dasabuvir; ombitasvir; paritaprevir; ritonavir  ombitasvir; paritaprevir; ritonavir  posaconazole  red yeast rice This medicine may also interact with the following medications:  alcohol  birth control pills  certain antibiotics like erythromycin and clarithromycin  certain antivirals for HIV or hepatitis  certain medicines for cholesterol like fenofibrate, gemfibrozil, and niacin  certain medicines for fungal infections like ketoconazole and itraconazole  colchicine  cyclosporine  digoxin  grapefruit juice  rifampin This list may not describe all possible interactions. Give your health care provider a list of all the medicines, herbs, non-prescription drugs, or dietary supplements you use. Also tell them if you smoke,  drink alcohol, or use illegal drugs. Some items may interact with your medicine. What should I watch for while using this medicine? Visit your doctor or health care professional for regular check-ups. You may need regular tests to make sure your liver is working properly. Your health care professional may tell you to stop taking this medicine if you develop muscle problems. If your muscle problems do not go away after stopping this medicine, contact your health care professional. Do not become pregnant while taking this medicine. Women should inform their health care professional if they wish to become pregnant or think they might be pregnant. There is a potential for serious side effects to an unborn child. Talk to your health care professional or pharmacist for more information. Do not breast-feed an infant while taking this medicine. This medicine may increase blood sugar. Ask your healthcare provider if changes in diet or medicines are needed if you have diabetes. If you are going to need surgery or other procedure, tell your doctor that you are using this medicine. This drug is only part of a total heart-health program. Your doctor or a dietician can suggest a low-cholesterol and low-fat diet to help. Avoid alcohol and smoking, and keep a proper exercise schedule. This medicine may cause a decrease in Co-Enzyme Q-10. You should make sure that you get enough Co-Enzyme Q-10 while you are taking this medicine. Discuss the foods you eat and the vitamins you take with your health care professional. What side effects may I notice from receiving this medicine? Side effects that you should report to your doctor or health care professional as soon as possible:  allergic reactions like skin rash, itching or hives, swelling of the face, lips, or tongue  fever  joint pain  loss of memory  redness, blistering, peeling or loosening of the skin, including inside the mouth  signs and symptoms of high  blood sugar such as being more thirsty or hungry or having to urinate more than normal. You may also feel very tired or have blurry vision.  signs and symptoms of liver injury like dark yellow or brown urine; general ill feeling or flu-like symptoms; light-belly pain; unusually weak or tired; yellowing of the eyes or skin  signs and symptoms of muscle injury like dark urine; trouble passing urine or change in the amount of urine; unusually weak or tired; muscle pain or side or back pain Side effects that usually do not require medical attention (report to your doctor or health care professional if they continue or are bothersome):  diarrhea  nausea  stomach pain  trouble sleeping  upset stomach This list may not describe all possible side effects. Call your doctor for medical advice about side effects. You may report side effects to FDA at 1-800-FDA-1088. Where should I keep my medicine? Keep out of the reach of children. Store between 20  and 25 degrees C (68 and 77 degrees F). Throw away any unused medicine after the expiration date. NOTE: This sheet is a summary. It may not cover all possible information. If you have questions about this medicine, talk to your doctor, pharmacist, or health care provider.  2020 Elsevier/Gold Standard (2018-03-30 11:36:16)      Radial Site Care  This sheet gives you information about how to care for yourself after your procedure. Your health care provider may also give you more specific instructions. If you have problems or questions, contact your health care provider. What can I expect after the procedure? After the procedure, it is common to have:  Bruising and tenderness at the catheter insertion area. Follow these instructions at home: Medicines  Take over-the-counter and prescription medicines only as told by your health care provider. Insertion site care  Follow instructions from your health care provider about how to take care of your  insertion site. Make sure you: ? Wash your hands with soap and water before you change your bandage (dressing). If soap and water are not available, use hand sanitizer. ? Change your dressing as told by your health care provider. ? Leave stitches (sutures), skin glue, or adhesive strips in place. These skin closures may need to stay in place for 2 weeks or longer. If adhesive strip edges start to loosen and curl up, you may trim the loose edges. Do not remove adhesive strips completely unless your health care provider tells you to do that.  Check your insertion site every day for signs of infection. Check for: ? Redness, swelling, or pain. ? Fluid or blood. ? Pus or a bad smell. ? Warmth.  Do not take baths, swim, or use a hot tub until your health care provider approves.  You may shower 24-48 hours after the procedure, or as directed by your health care provider. ? Remove the dressing and gently wash the site with plain soap and water. ? Pat the area dry with a clean towel. ? Do not rub the site. That could cause bleeding.  Do not apply powder or lotion to the site. Activity   For 24 hours after the procedure, or as directed by your health care provider: ? Do not flex or bend the affected arm. ? Do not push or pull heavy objects with the affected arm. ? Do not drive yourself home from the hospital or clinic. You may drive 24 hours after the procedure unless your health care provider tells you not to. ? Do not operate machinery or power tools.  Do not lift anything that is heavier than 10 lb (4.5 kg), or the limit that you are told, until your health care provider says that it is safe.  Ask your health care provider when it is okay to: ? Return to work or school. ? Resume usual physical activities or sports. ? Resume sexual activity. General instructions  If the catheter site starts to bleed, raise your arm and put firm pressure on the site. If the bleeding does not stop, get help  right away. This is a medical emergency.  If you went home on the same day as your procedure, a responsible adult should be with you for the first 24 hours after you arrive home.  Keep all follow-up visits as told by your health care provider. This is important. Contact a health care provider if:  You have a fever.  You have redness, swelling, or yellow drainage around your insertion site. Get  help right away if:  You have unusual pain at the radial site.  The catheter insertion area swells very fast.  The insertion area is bleeding, and the bleeding does not stop when you hold steady pressure on the area.  Your arm or hand becomes pale, cool, tingly, or numb. These symptoms may represent a serious problem that is an emergency. Do not wait to see if the symptoms will go away. Get medical help right away. Call your local emergency services (911 in the [place] Do not drive yourself to the hospital. Summary  After the procedure, it is common to have bruising and tenderness at the site.  Follow instructions from your health care provider about how to take care of your radial site wound. Check the wound every day for signs of infection.  Do not lift anything that is heavier than 10 lb (4.5 kg), or the limit that you are told, until your health care provider says that it is safe. This information is not intended to replace advice given to you by your health care provider. Make sure you discuss any questions you have with your health care provider. Document Revised: 07/14/2017 Document Reviewed: 07/14/2017 Elsevier Patient Education  [place].    Coronary Angiogram With Stent, Care After This sheet gives you information about how to care for yourself after your procedure. Your health care provider may also give you more specific instructions. If you have problems or questions, contact your health care provider. What can I expect after the procedure? After the procedure, it is  common to have:  Bruising and tenderness at the insertion site. This usually fades within 1-2 weeks.  A collection of blood under the skin (hematoma). This usually decreases within 1-2 weeks. Follow these instructions at home: Medicines  Take over-the-counter and prescription medicines only as told by your health care provider.  If you were prescribed an antibiotic medicine, take it as told by your health care provider. Do not stop using the antibiotic even if you start to feel better.  If you take medicines for diabetes, your health care provider may need to change how much you take. Ask your health care provider for specific directions about taking your diabetes medicines.  If you are taking blood thinners: ? Talk with your health care provider before you take any medicines that contain aspirin or NSAIDs, such as ibuprofen. These medicines increase your risk for dangerous bleeding. ? Take your medicine exactly as told, at the same time every day. ? Avoid activities that could cause injury or bruising, and follow instructions about how to prevent falls. ? Wear a medical alert bracelet or carry a card that lists what medicines you take. Eating and drinking   Follow instructions from your health care provider about eating or drinking restrictions.  Eat a heart-healthy diet that includes plenty of fresh fruits and vegetables.  Avoid foods that are high in salt, sugar, or saturated fat. Avoid fried foods or canned or highly processed food.  Drink enough fluid to keep your urine pale yellow. Alcohol use  Do not drink alcohol if: ? Your health care provider tells you not to. ? You are pregnant, may be pregnant, or plan to become pregnant.  If you drink alcohol: ? Limit how much you use to:  0-1 drink a day for women.  0-2 drinks a day for men. ? Be aware of how much alcohol is in your drink. In the [place] one drink equals one 12 oz bottle  of beer (355 mL), one 5 oz glass of wine  (148 mL), or one 1 oz glass of hard liquor (44 mL). Bathing  Do not take baths, swim, or use a hot tub until your health care provider approves. Ask your health care provider if you may take showers. You may only be allowed to take sponge baths.  Gently wash the insertion site with plain soap and water.  Pat the area dry with a clean towel. Do not rub. This may cause bleeding. Incision care  Follow instructions from your health care provider about how to take care of your insertion area. Make sure you: ? Wash your hands with soap and water before and after you change your bandage (dressing). If soap and water are not available, use hand sanitizer. ? Change your dressing as told by your health care provider. ? Leave stitches (sutures) or adhesive strips in place. These skin closures may need to stay in place for 2 weeks or longer. If adhesive strip edges start to loosen and curl up, you may trim the loose edges. Do not remove adhesive strips completely unless your health care provider tells you to do that.  Do not apply powder or lotion on the insertion area.  Check your insertion area every day for signs of infection. Check for: ? Redness, swelling, or pain. ? Fluid or blood. ? Warmth. ? Pus or a bad smell. Activity  Do not drive for 24 hours if you were given a sedative during your procedure.  Rest as told by your health care provider. ? Avoid sitting for a long time without moving. Get up to take short walks every 1-2 hours. This is important to improve blood flow and breathing. Ask for help if you feel weak or unsteady.  Do not lift anything that is heavier than 10 lb (4.5 kg), or the limit that you are told, until your health care provider says that it is safe.  Return to your normal activities as told by your health care provider. Ask your health care provider what activities are safe for you. Lifestyle   Do not use any products that contain nicotine or tobacco, such as  cigarettes, e-cigarettes, and chewing tobacco. If you need help quitting, ask your health care provider.  If needed, work with your health care provider to treat other problems, such as being overweight, or having high blood pressure or diabetes.  Get regular exercise. Do exercises as told by your health care provider. General instructions  Tell all your health care providers that you have a stent. This is especially important if you are going to get imaging studies, such as MRI.  Wear compression stockings as told by your health care provider. These stockings help to prevent blood clots and reduce swelling in your legs.  Do not strain during a bowel movement if the procedure was done through your leg. Straining may cause bleeding from the insertion site.  Keep all follow-up visits as directed by your health care provider. This is important. Contact a health care provider if you:  Have a fever.  Have chills.  Have redness, swelling, or pain around your insertion area.  Have fluid or blood (other than a little blood on the dressing) coming from your insertion area.  Notice that your insertion area feels warm to the touch.  Have pus or a bad smell coming from your insertion area.  Have more bleeding from the insertion area. Hold pressure on the area. Get help right away  if:  You develop chest pain or shortness of breath.  You feel like fainting or you faint.  Your leg or arm becomes cool, numb, or tingly.  You have unusual pain.  Your insertion area is bleeding, and bleeding continues after 30 minutes of steadily held pressure.  You develop bleeding anywhere else, including from your rectum. There may be bright red blood in your urine or stool, or you may have black, tarry stool. These symptoms may represent a serious problem that is an emergency. Do not wait to see if the symptoms will go away. Get medical help right away. Call your local emergency services (911 in the [place]  Do not drive yourself to the hospital. Summary  After this procedure, it is common to have bruising and tenderness around the catheter insertion site. This will go away in a few weeks.  Follow your health care provider's instructions about caring for your insertion site. Change dressing and clean the area as instructed.  Eat a heart-healthy diet. Limit alcohol use. Do not use tobacco or nicotine.  Contact a health care provider if you have fever or chills, or if you have pus or a bad smell coming from the site.  Get help right away if you develop chest pain, you faint, or have bleeding at the insertion site. This information is not intended to replace advice given to you by your health care provider. Make sure you discuss any questions you have with your health care provider. Document Revised: 12/28/2018 Document Reviewed: 12/28/2018 Elsevier Patient Education  [place].

## 2020-02-27 NOTE — Progress Notes (Addendum)
Client c/o chest pain 5-6/10 pressure; Dr Einar Gip notified and order noted; Dr Einar Gip notified of client b/p; client notified per Dr Einar Gip that chest pain to be expected and that he had spoken to client's daughters and client voiced understanding

## 2020-02-28 ENCOUNTER — Encounter (HOSPITAL_COMMUNITY): Payer: Self-pay | Admitting: Cardiology

## 2020-02-28 ENCOUNTER — Encounter: Payer: Self-pay | Admitting: Cardiology

## 2020-03-06 ENCOUNTER — Telehealth: Payer: Self-pay | Admitting: *Deleted

## 2020-03-06 NOTE — Telephone Encounter (Signed)
Pt called concerned about her follow up appt. She is s/p coronary pci with dr. Einar Gip. Reassured pt that her follow up with Dr. Einar Gip is sufficient post pci and we will see her at her 9 mo follow up for her carotid stenosis that is yet to be scheduled. Reviewed the signs and symptoms of TIA/ stroke and advised her to call 911 if she were to experience any symptoms.

## 2020-03-07 ENCOUNTER — Telehealth (HOSPITAL_COMMUNITY): Payer: Self-pay

## 2020-03-07 ENCOUNTER — Ambulatory Visit: Payer: Medicare Other | Admitting: Cardiology

## 2020-03-07 ENCOUNTER — Encounter: Payer: Self-pay | Admitting: Cardiology

## 2020-03-07 ENCOUNTER — Other Ambulatory Visit: Payer: Self-pay

## 2020-03-07 VITALS — BP 122/80 | HR 82 | Resp 16 | Ht 68.0 in | Wt 197.0 lb

## 2020-03-07 DIAGNOSIS — I10 Essential (primary) hypertension: Secondary | ICD-10-CM

## 2020-03-07 DIAGNOSIS — E782 Mixed hyperlipidemia: Secondary | ICD-10-CM | POA: Diagnosis not present

## 2020-03-07 DIAGNOSIS — I6521 Occlusion and stenosis of right carotid artery: Secondary | ICD-10-CM | POA: Diagnosis not present

## 2020-03-07 DIAGNOSIS — I25118 Atherosclerotic heart disease of native coronary artery with other forms of angina pectoris: Secondary | ICD-10-CM

## 2020-03-07 DIAGNOSIS — I471 Supraventricular tachycardia: Secondary | ICD-10-CM

## 2020-03-07 MED ORDER — PROPRANOLOL HCL ER 60 MG PO CP24: 60.0000 mg | ORAL_CAPSULE | Freq: Every day | ORAL | 2 refills | Status: DC

## 2020-03-07 MED ORDER — NEXLIZET 180-10 MG PO TABS: 1.0000 | ORAL_TABLET | Freq: Every day | ORAL | 2 refills | Status: DC

## 2020-03-07 MED ORDER — CLOPIDOGREL BISULFATE 75 MG PO TABS: 75.0000 mg | ORAL_TABLET | Freq: Every day | ORAL | 2 refills | Status: DC

## 2020-03-07 NOTE — Progress Notes (Signed)
Primary Physician/Referring:  [name], MD  Patient ID: Madison West, female    DOB: 1956/05/29, 64 y.o.   MRN: [id number]  Chief Complaint  Patient presents with  . Follow-up    1 week  . Post Cath  . Coronary Artery Disease    HPI: Madison West  is a 64 y.o. female  history of PSVT on 08/04/2016, terminated with adenosine, anxiety and depression, history of breast cancer status post radiation therapy and chemotherapy in 2009, hypertension, mild aortic valve stenosis, hyperlipidemia, palpitations, asymptomatic right carotid artery stenosis SP right carotid endarterectomy on 06/09/2019 presents here for follow-up.  She did have episode of PSVT while in the hospital postoperatively.   I had seen her in July 2021 when she presented with symptoms suggestive of angina pectoris. In view of abnormal echocardiogram and also nuclear stress test, ongoing symptoms of chest pain, she was recommended cardiac catheterization which she underwent on 02/27/2020.  She now presents for follow-up.  Past Medical History:  Diagnosis Date  . Anxiety   . Arthritis    low back and hip pain intermittent  . Breast cancer ([place]) 06/02/07   r breast -surgery ,radiaology. chemotherapy  . Carpal tunnel syndrome    right hand  . Colon polyps   . Complication of anesthesia    Fentanyl, Versed-makes extra hyper, bradycardia x 1 in PACU, [facility] (08/15/11 cardiology felt neostigmine may have resulted in AV nodal block)   . Coronary artery disease   . Depression    denies  . Dysplasia of vulva   . Hypertension   . Palpitations    PSVT, s/p adenosine 08/04/16  . S/P breast lumpectomy 07/04/07   R breast  . S/P radiation therapy 2009    Past Surgical History:  Procedure Laterality Date  . APPENDECTOMY     age 21  . CESAREAN SECTION     x 2  . COLONOSCOPY W/ POLYPECTOMY    . COLONOSCOPY WITH PROPOFOL N/A 04/27/2016   Procedure: COLONOSCOPY WITH PROPOFOL;  Surgeon: Garlan Fair, MD;   Location: [facility];  Service: Endoscopy;  Laterality: N/A;  . DILATION AND CURETTAGE OF UTERUS     multiple  . ENDARTERECTOMY Right 06/09/2019  . ENDARTERECTOMY Right 06/09/2019   Procedure: ENDARTERECTOMY CAROTID RIGHT;  Surgeon: Elam Dutch, MD;  Location: [facility];  Service: Vascular;  Laterality: Right;  . INTRAUTERINE DEVICE INSERTION    . IUD REMOVAL    . LEFT HEART CATH AND CORONARY ANGIOGRAPHY N/A 02/27/2020   Procedure: LEFT HEART CATH AND CORONARY ANGIOGRAPHY;  Surgeon: Adrian Prows, MD;  Location: [facility];  Service: Cardiovascular;  Laterality: N/A;  . MASTECTOMY PARTIAL / LUMPECTOMY W/ AXILLARY LYMPHADENECTOMY Right    lumpectomy and lymph nodes removed  . PATCH ANGIOPLASTY Right 06/09/2019   Procedure: Patch Angioplasty of right carotid artery using hemashield paltinum finesse patch;  Surgeon: Elam Dutch, MD;  Location: [facility] OR;  Service: Vascular;  Laterality: Right;  . TUBAL LIGATION    . VULVA SURGERY     Multiple times for dysplasia    Social History   Tobacco Use  . Smoking status: Former Smoker    Packs/day: 0.75    Years: 20.00    Pack years: 15.00    Types: Cigarettes    Quit date: 05/06/1997    Years since quitting: 22.8  . Smokeless tobacco: Never Used  Substance Use Topics  . Alcohol use: Yes    Alcohol/week: 0.0  standard drinks    Comment: occ   Marital Status: Widowed   Current Outpatient Medications on File Prior to Visit  Medication Sig Dispense Refill  . alprazolam (XANAX) 2 MG tablet 1/2-1 tid prn anxiety. (Patient taking differently: Take 1-2 mg by mouth in the morning, at noon, and at bedtime. ) 270 tablet 0  . aspirin 81 MG chewable tablet Chew 81 mg by mouth daily.     Marland Kitchen atorvastatin (LIPITOR) 10 MG tablet Take 1 tablet (10 mg total) by mouth daily. 30 tablet 0  . cyclobenzaprine (FLEXERIL) 5 MG tablet Take 1-2 tablets (5-10 mg total) by mouth 3 (three) times daily as needed for muscle spasms. 30 tablet 0  . Garlic 027 MG  CAPS Take 500 mg by mouth in the morning and at bedtime.     Marland Kitchen losartan-hydrochlorothiazide (HYZAAR) 50-12.5 MG tablet Take 0.5 tablets by mouth daily. 45 tablet 1  . nitroGLYCERIN (NITROSTAT) 0.4 MG SL tablet Place 0.4 mg under the tongue every 5 (five) minutes as needed for chest pain.    . Omega-3 Fatty Acids (FISH OIL) 500 MG CAPS Take 500 mg by mouth in the morning and at bedtime.      No current facility-administered medications on file prior to visit.   Review of Systems  Cardiovascular: Positive for palpitations. Negative for chest pain, dyspnea on exertion and leg swelling.  Musculoskeletal: Positive for joint pain.  Gastrointestinal: Negative for melena.   Objective  Blood pressure 122/80, pulse 82, resp. rate 16, height 5' 8" (1.727 m), weight 197 lb (89.4 kg), SpO2 98 %. Body mass index is 29.95 kg/m.   Vitals with BMI 03/07/2020 02/27/2020 02/27/2020  Height 5' 8" - -  Weight 197 lbs - -  BMI 74.12 - -  Systolic 878 676 720  Diastolic 80 75 63  Pulse 82 79 78    Physical Exam Constitutional:      Appearance: She is well-developed.     Comments: Mildly obese  HENT:     Head: Atraumatic.  Neck:     Comments: Right carotid endarterectomy scar Cardiovascular:     Rate and Rhythm: Normal rate and regular rhythm.     Pulses: Intact distal pulses.          Carotid pulses are on the right side with bruit and on the left side with bruit.    Heart sounds: Murmur heard.  Midsystolic murmur is present with a grade of 3/6 at the upper right sternal border.      Comments: Right CEA scar noted. No JVD, no leg edema.  Mild ecchymosis right forearm. Pulmonary:     Effort: Pulmonary effort is normal. No accessory muscle usage or respiratory distress.     Breath sounds: Normal breath sounds.  Abdominal:     General: Bowel sounds are normal.     Palpations: Abdomen is soft.  Neurological:     Mental Status: She is oriented to person, place, and time.    Laboratory examination:    CMP Latest Ref Rng & Units 02/23/2020 01/19/2020 06/10/2019  Glucose 65 - 99 mg/dL 99 112(H) 145(H)  BUN 8 - 27 mg/dL _0 Creatinine 0.57 - 1.00 mg/dL 0.64 0.67 0.68  Sodium 134 - 144 mmol/L 133(L) 135 137  Potassium 3.5 - 5.2 mmol/L 4.4 4.2 3.7  Chloride 96 - 106 mmol/L 94(L) 97 102  CO2 20 - 29 mmol/L _1 Calcium 8.7 - 10.3 mg/dL 10.0 10.0 9.1  Total Protein 6.0 - 8.5 g/dL - 7.0 -  Total Bilirubin 0.0 - 1.2 mg/dL - 0.5 -  Alkaline Phos 48 - 121 IU/L - 64 -  AST 0 - 40 IU/L - 18 -  ALT 0 - 32 IU/L - 17 -   CBC Latest Ref Rng & Units 02/23/2020 01/19/2020 06/10/2019  WBC 3.4 - 10.8 x10E3/uL 9.4 7.3 15.3(H)  Hemoglobin 11.1 - 15.9 g/dL 15.2 15.4 14.5  Hematocrit 34.0 - 46.6 % 45.0 45.7 42.9  Platelets 150 - 450 x10E3/uL 446 394 422(H)   Lipid Panel Recent Labs    01/19/20 1434  CHOL 253*  TRIG 138  LDLCALC 168*  HDL 60  CHOLHDL 4.2     HEMOGLOBIN A1C Lab Results  Component Value Date   HGBA1C 5.8 (H) 10/15/2017   MPG 114 10/06/2011   TSH in my chart okay, no complaint No results for input(s): TSH in the last 8760 hours. Cardiac Studies:   Exercise sestamibi stress test 04/04/2018: 1. The resting electrocardiogram demonstrated normal sinus rhythm, LAD, LAFB, incomplete RBBB, poor R progression and no resting arrhythmias. The stress electrocardiogram was normal. Patient exercised on Bruce protocol for 4:55 minutes and achieved 6.95 METS. Stress test terminated due to chest pressure and 87% MPHR achieved (Target HR >85%). Resting BP 150/88 and pea BP 218/102 mm hg. 2. Myocardial perfusion imaging is normal. Overall left ventricular systolic function was normal without regional wall motion abnormalities. The left ventricular ejection fraction was 72%. This is a low risk study. Chest pain could be related to hypertensive heart disease. Clinical correlation recommended.  Event monitor 30 days 03/11/2018: Episodic transmission 03/20/2018 1:30 pm: 5 beat NSVT.One brief  atrial tach @ 142/min. Frequent PAC in bigeminal pattern, symptomatic. Rare PVC.  Echocardiogram 03/08/2018: Left ventricle cavity is normal in size. Moderate concentric hypertrophy of the left ventricle. Normal global wall motion. Doppler evidence of grade I (impaired) diastolic dysfunction, elevated LAP. Calculated EF 60%. Mild aortic valve leaflet calcification. Severely restricted aortic valve leaflets. Mild aortic valve stenosis. Trace aortic regurgitation. Aortic valve peak pressure gradient of 23 and mean gradient of 13 mmHg, calculated aortic valve area 1.59 cm. In 2-D, the valve appears to be severely restricted. Compared to the echocardiogram 09/09/2016, mean gradient has increased from 13 mmHg. Otherwise no significant change. Consider TEE if clinically indicated.  Carotid artery duplex  11/01/2019:  Right Carotid: Velocities in the right ICA are consistent with a 1-39%  stenosis.  Status post endarterectomy.   Left Carotid: Velocities in the left ICA are consistent with a 1-39% stenosis.  Non-hemodynamically significant plaque <50% noted in the CCA.   Vertebrals: Right vertebral artery demonstrates antegrade flow. Left vertebral  artery was not visualized.  Subclavians: Normal flow hemodynamics were seen in bilateral subclavian  arteries.   Left Heart Catheterization 02/27/20:  Normal LVEDP.  No pressure gradient across the aortic valve. RCA: Dominant vessel.  Mild diffuse disease. Left main: Large caliber vessel, mild calcification evident. LAD: Large caliber vessel.  Mild diffuse luminal irregularity. Circumflex: Small vessel, continues in the AV groove with a mid 80% stenosis. Ramus intermediate: It is a large vessel, gives origin to large lateral ramus branches, diffusely diseased, proximal segment has a 80 to 90% mildly calcified concentric stenosis.  Successful stenting with 3.0 x 18 mm resolute Onyx, distal end of the stent was in the diffusely diseased vessel and there was a  type B dissection evident and the vessel was much larger than anticipated distally due to  underfilling.  This was stented with a 3.0 x 12 mm resolute Onyx.  Overall stenosis reduced from diffusely diseased 90% to 0% with TIMI-3 to TIMI-3 flow maintained.   Recommendation: I have discussed with the patient regarding use of statins which is paramount.  She will be discharged home on aspirin along with Plavix.  135 mL contrast utilized.  EKG:  EKG 03/07/2020: Sinus rhythm with first-degree block at rate of 76 bpm, left axis deviation, left anterior fascicular block.  Incomplete right bundle branch block.  Poor R wave progression, probably normal variant but cannot exclude anteroseptal infarct old.  No evidence of ischemia, normal QT interval.  No significant change from  EKG 01/26/2018.  Assessment     ICD-10-CM   1. Coronary artery disease of native artery of native heart with stable angina pectoris (HCC)  I25.118 EKG 12-Lead    propranolol ER (INDERAL LA) 60 MG 24 hr capsule    Bempedoic Acid-Ezetimibe (NEXLIZET) 180-10 MG TABS    clopidogrel (PLAVIX) 75 MG tablet  2. Essential hypertension  I10 CMP14+EGFR    CBC    CBC    CMP14+EGFR  3. Mixed hyperlipidemia  E78.2 Bempedoic Acid-Ezetimibe (NEXLIZET) 180-10 MG TABS    Lipid Panel With LDL/HDL Ratio    Lipid Panel With LDL/HDL Ratio  4. PSVT (paroxysmal supraventricular tachycardia) (HCC)  I47.1 propranolol ER (INDERAL LA) 60 MG 24 hr capsule   Meds ordered this encounter  Medications  . propranolol ER (INDERAL LA) 60 MG 24 hr capsule    Sig: Take 1 capsule (60 mg total) by mouth daily.    Dispense:  30 capsule    Refill:  2  . Bempedoic Acid-Ezetimibe (NEXLIZET) 180-10 MG TABS    Sig: Take 1 tablet by mouth daily.    Dispense:  30 tablet    Refill:  2  . clopidogrel (PLAVIX) 75 MG tablet    Sig: Take 1 tablet (75 mg total) by mouth daily.    Dispense:  90 tablet    Refill:  2    Medications Discontinued During This Encounter    Medication Reason  . metoprolol succinate (TOPROL-XL) 50 MG 24 hr tablet Patient Preference  . aspirin EC 81 MG tablet Duplicate  . clopidogrel (PLAVIX) 75 MG tablet Reorder      Recommendations:   Madison West  is a 64 y.o. history of PSVT on 08/04/2016, terminated with adenosine, anxiety and depression, history of breast cancer status post radiation therapy and chemotherapy in 2009, hypertension, mild aortic valve stenosis, hyperlipidemia, palpitations, asymptomatic right carotid artery stenosis SP right carotid endarterectomy on 06/09/2019 presents here for follow-up.  She did have episode of PSVT while in the hospital postoperatively.   I had seen her in July 2021 when she presented with symptoms suggestive of angina pectoris. Underwent cardiac catheterization and stenting to a large high-grade ramus intermedius stenosis on 02/27/2020.  She has not had any recurrence of angina pectoris.  Patient has been very skeptical about statins in the past.  She is now tolerating atorvastatin 10 mg without any side effects.  She wanted to come off of the statins in view of risk of diabetes mellitus.  I had a very long and lengthy discussion with the patient regarding diabetes mellitus however also discussed with her regarding her markedly elevated LDL around 170 with a goal LDL really to be around 54 mg percent.  This is in view of carotid disease and also significant coronary artery disease as well.  I do not think 10 mg of Lipitor will bring the LDL to goal, she is willing to also try Nexlezet, which is a nonstatin based therapy.  I have encouraged her to also continue taking Lipitor with a combination medication, I suspect she will have at least a 60 to 70% reduction in her LDL which will bring her to goal.  Advised her that if she decided to stop Lipitor that it is her prerogative and she could certainly do this.  Patient is extremely conscious and wary of taking any medications.  With regard to  PSVT, she has had 2 episodes of PSVT while at home.  They lasted a few minutes only.  I had started her on metoprolol succinate in the hospital after PCI, patient is again very of taking the medication as it caused her to have low blood pressure.  She is willing to try propranolol as one of her friends have tried this hence low-dose propranolol XL 60 mg daily was given both for PSVT and for CAD although she does have underlying first-degree AV block.  I will repeat CBC, CMP and lipid profile testing in 5 to 6 weeks and I would like to see her back in 8 to 10 weeks for follow-up.  Patient now appears to be more amenable for medication changes since angioplasty to her coronary arteries.  This was a 40-minute encounter.   Adrian Prows, MD, [facility] 03/07/2020, 10:20 AM Office: [phone]

## 2020-03-07 NOTE — Telephone Encounter (Signed)
Pt insurance is active and benefits verified through Medicare A/B. Co-pay $0.00, DED $203.00/$203.00 met, out of pocket $0.00/$0.00 met, co-insurance 20%. No pre-authorization required. Passport, 03/07/20 @ 9:48AM, FVW#[id number]  Will contact patient to see if she is interested in the Cardiac Rehab Program. If interested, patient will need to complete follow up appt. Once completed, patient will be contacted for scheduling upon review by the RN Navigator.

## 2020-03-07 NOTE — Patient Instructions (Signed)
Nov 1st week labs.

## 2020-03-11 ENCOUNTER — Telehealth: Payer: Self-pay

## 2020-03-11 NOTE — Telephone Encounter (Signed)
Let her know this is normal and not to worry. Late complications are very rare after so many days of cath

## 2020-03-11 NOTE — Telephone Encounter (Signed)
Spoke to patient

## 2020-03-11 NOTE — Telephone Encounter (Signed)
Patient called that she woke up with her right hand swollen and wanted to make you aware of it she mention that she recently got a cath done about two weeks ago please advise.

## 2020-03-11 NOTE — Telephone Encounter (Signed)
Please call patient

## 2020-03-28 DIAGNOSIS — Z9889 Other specified postprocedural states: Secondary | ICD-10-CM | POA: Diagnosis not present

## 2020-03-28 DIAGNOSIS — C519 Malignant neoplasm of vulva, unspecified: Secondary | ICD-10-CM | POA: Diagnosis not present

## 2020-03-28 DIAGNOSIS — F419 Anxiety disorder, unspecified: Secondary | ICD-10-CM | POA: Diagnosis not present

## 2020-04-10 DIAGNOSIS — Z23 Encounter for immunization: Secondary | ICD-10-CM | POA: Diagnosis not present

## 2020-04-19 ENCOUNTER — Telehealth: Payer: Self-pay

## 2020-04-19 DIAGNOSIS — F411 Generalized anxiety disorder: Secondary | ICD-10-CM

## 2020-04-19 MED ORDER — ALPRAZOLAM 2 MG PO TABS: ORAL_TABLET | ORAL | 0 refills | Status: DC

## 2020-04-19 NOTE — Telephone Encounter (Signed)
DR Carlota Raspberry this came down to me from [place] stating this patient is in a crises and need to be reached out to her asap due to her anxiety. I called the patient. She is currently being seen by Wylene Simmer I think she said(Karr) Wylene Simmer is not working out for her and she will be changing Psych soon. Karr sent in Xanax on Oct 25 to her mail in pharmacy and her turn around time is 10 days on her xanax and she is about to run out real soon and can not go with out her medication. She called Karr's office and they have not reached out to her. This month is a very stressful time for her and she would like to see if you can send her in a emergency refill for at least 10-14 days until her mail in order come in. I called [facility] and they indeed stated the turn around time is 7-10 days. Is there anything you can do in the mean time?

## 2020-04-19 NOTE — Telephone Encounter (Signed)
Pt. Called presenting/asserting a case of immediate crisis. Pt. Asserts she is having significant conflict with the psychiatrist she was referred to and has been seeing. Pt. Asserts she will run out of the medication Xanax tomorrow and is in desperate need of a refill.   Pt. Aware of Dr. Vonna Kotyk stance on her medication and is requesting that he give a temporary refill until she can find another psychiatrist.

## 2020-04-19 NOTE — Telephone Encounter (Signed)
Note reviewed. Currently firday night and unable to discuss plan with her psychitrists office In order to prevent withdrawal from benzodiazepine I will send temporary supply to allow time for mail order rx to arrive (should arrive by 11/4 if 10 day turnaround and with running out tomorrow will send in 5 day Rx). Future refills will need to be discussed with psychiatry.  Controlled substance database (PDMP) reviewed. Last rx filled for #28 on 7/27 - not sure [facility] shows up.

## 2020-04-19 NOTE — Telephone Encounter (Signed)
If you are able to send in a emergency supply patient would like this to go to the local pharmacy on file. [facility].

## 2020-04-21 DIAGNOSIS — S90851S Superficial foreign body, right foot, sequela: Secondary | ICD-10-CM | POA: Diagnosis not present

## 2020-04-21 DIAGNOSIS — L03115 Cellulitis of right lower limb: Secondary | ICD-10-CM | POA: Diagnosis not present

## 2020-04-24 ENCOUNTER — Other Ambulatory Visit: Payer: Self-pay

## 2020-04-24 ENCOUNTER — Ambulatory Visit (INDEPENDENT_AMBULATORY_CARE_PROVIDER_SITE_OTHER): Payer: Medicare Other | Admitting: Family Medicine

## 2020-04-24 ENCOUNTER — Encounter: Payer: Self-pay | Admitting: Family Medicine

## 2020-04-24 VITALS — BP 132/82 | HR 89 | Temp 97.3°F | Ht 68.0 in | Wt 196.0 lb

## 2020-04-24 DIAGNOSIS — G8929 Other chronic pain: Secondary | ICD-10-CM | POA: Diagnosis not present

## 2020-04-24 DIAGNOSIS — Z8679 Personal history of other diseases of the circulatory system: Secondary | ICD-10-CM

## 2020-04-24 DIAGNOSIS — F411 Generalized anxiety disorder: Secondary | ICD-10-CM

## 2020-04-24 DIAGNOSIS — I6521 Occlusion and stenosis of right carotid artery: Secondary | ICD-10-CM

## 2020-04-24 DIAGNOSIS — M545 Low back pain, unspecified: Secondary | ICD-10-CM | POA: Diagnosis not present

## 2020-04-24 MED ORDER — CYCLOBENZAPRINE HCL 5 MG PO TABS: 5.0000 mg | ORAL_TABLET | Freq: Three times a day (TID) | ORAL | 0 refills | Status: DC | PRN

## 2020-04-24 MED ORDER — PROPRANOLOL HCL 10 MG PO TABS: 10.0000 mg | ORAL_TABLET | Freq: Three times a day (TID) | ORAL | 1 refills | Status: DC

## 2020-04-24 NOTE — Patient Instructions (Addendum)
  I will write low dose propanolol - 10mg  three times per day.  Some of your symptoms may also be from your lower dose of xanax as well, but please call your cardiologist about your recent symptoms as well.   I refilled flexeril, but if any worsening or change in your back pain - be seen.   Continue follow-up with psychiatry regarding anxiety treatment, but I do recommend meeting with counseling.  Please let me know if you need more numbers for counseling.  Return to the clinic or go to the nearest emergency room if any of your symptoms worsen or new symptoms occur.   If you have lab work done today you will be contacted with your lab results within the next 2 weeks.  If you have not heard from [place] then please contact us. The fastest way to get your results is to register for My Chart.   IF you received an x-ray today, you will receive an invoice from [facility]. Please contact [facility] at [phone] with questions or concerns regarding your invoice.   IF you received labwork today, you will receive an invoice from [facility]. Please contact [facility] at [phone] with questions or concerns regarding your invoice.   Our billing staff will not be able to assist you with questions regarding bills from these companies.  You will be contacted with the lab results as soon as they are available. The fastest way to get your results is to activate your My Chart account. Instructions are located on the last page of this paperwork. If you have not heard from [place] regarding the results in 2 weeks, please contact this office.

## 2020-04-24 NOTE — Progress Notes (Signed)
Subjective:  Patient ID: Madison West, female    DOB: [birth year]  Age: 64 y.o. MRN: [id number]  CC:  Chief Complaint  Patient presents with   Follow-up    on hypertension and medication review.  PT reports having issues with BP since last OV. pt states her BP has been really low at times and high at times. Pt states she has felt really bad since her Cardiologyst put her on new medication.    medication issues    Pt reports her cardiologyst put her on new medication and she has felt really bad. Pt states the Plavix makes her hear race, and the statin gives her anxiety. pt states she dosen't want to take the medication, but is scared not to take it.    HPI Madison West presents for   Hypertension/cardiac: Hypertension with hx of CAD, PSVT.  Cardiologist Dr. Einar Gip, left heart cath September 7.  Stenting of intermediate ramus.  Stenosis was  decreased from 90% to 0%.  Cardiology recommended the use of statins which was paramount given her coronary disease.  She was discharged home on aspirin as well as Plavix.  She had been skeptical about statins.  Has been tolerating atorvastatin 10 mg without any side effects.  History of LDL 170 and with carotid disease as well as coronary artery disease concern of having 10 mg of Lipitor bring her LDL to goal.  Additionally she was prescribed bempedoic acid-ezetimibe.   In regards to her PSVT, she did take metoprolol in the hospital after PCI.  Reportedly only a few minutes with episodes of PSVT at home.  Propanolol XL 60 mg daily was given for both PSVT and CAD.  Plan for repeat lipid profile, CBC, CMP in 5 to 6 weeks from September visit.  Appointment scheduled December 1.  Since start of new medications she states she has not felt well.  Has felt her heart race at times.  Blood pressure has run high and low at times. Has headache. Does not feel good.  Did not take propanolol. Feels like too high of a dose.  Did not take nexlizet.  Still  taking lipitor - 1/2-1 per day.  Feels like plavix is making heart race- 3 times per day and does not feel well.  Did tapered back on xanax last week as running out, but did not stop. That was during the time when not feeling well. Still on lower dose off xanax now until Rx arrives from [place].  Has not called cardiology about concerns.  Home reading 120/72, other night 172/119.  Home stressors.  Has not called cardiology about med concerns.    Home readings: BP Readings from Last 3 Encounters:  04/24/20 132/82  03/07/20 122/80  02/27/20 134/75   Lab Results  Component Value Date   CREATININE 0.64 02/23/2020    Hx of back spasms, has taken flexeril in past. Ran out. Episodic back spasm.  Takes 1/2 - 1 as needed - depends on how bad pain is. Helps pain. No new or recent change in back pain. Hx of DDD.  Side effects discussed. Last refill #30 in January.   History Patient Active Problem List   Diagnosis Date Noted   Coronary artery disease of native artery of native heart with stable angina pectoris ([place])    Pure hypercholesterolemia    Angina pectoris ([place]) 02/26/2020   Asymptomatic stenosis of right carotid artery without infarction 06/09/2019   CRPS (complex regional pain syndrome), upper limb 10/19/2012  History of DVT (deep vein thrombosis) 10/05/2011   Hypertension 10/04/2011   Depression 10/04/2011   Personal history of vulvar dysplasia 08/15/2011   Grade 3 vulvar intraepithelial neoplasia 06/30/2011   Past Medical History:  Diagnosis Date   Anxiety    Arthritis    low back and hip pain intermittent   Breast cancer ([place]) 06/02/07   r breast -surgery ,radiaology. chemotherapy   Carpal tunnel syndrome    right hand   Colon polyps    Complication of anesthesia    Fentanyl, Versed-makes extra hyper, bradycardia x 1 in PACU, [facility] (08/15/11 cardiology felt neostigmine may have resulted in AV nodal block)    Coronary artery disease    Depression     denies   Dysplasia of vulva    Hypertension    Palpitations    PSVT, s/p adenosine 08/04/16   S/P breast lumpectomy 07/04/07   R breast   S/P radiation therapy 2009   Past Surgical History:  Procedure Laterality Date   APPENDECTOMY     age 57   CESAREAN SECTION     x 2   COLONOSCOPY W/ POLYPECTOMY     COLONOSCOPY WITH PROPOFOL N/A 04/27/2016   Procedure: COLONOSCOPY WITH PROPOFOL;  Surgeon: Garlan Fair, MD;  Location: [facility];  Service: Endoscopy;  Laterality: N/A;   DILATION AND CURETTAGE OF UTERUS     multiple   ENDARTERECTOMY Right 06/09/2019   ENDARTERECTOMY Right 06/09/2019   Procedure: ENDARTERECTOMY CAROTID RIGHT;  Surgeon: Elam Dutch, MD;  Location: [facility];  Service: Vascular;  Laterality: Right;   INTRAUTERINE DEVICE INSERTION     IUD REMOVAL     LEFT HEART CATH AND CORONARY ANGIOGRAPHY N/A 02/27/2020   Procedure: LEFT HEART CATH AND CORONARY ANGIOGRAPHY;  Surgeon: Adrian Prows, MD;  Location: [facility];  Service: Cardiovascular;  Laterality: N/A;   MASTECTOMY PARTIAL / LUMPECTOMY W/ AXILLARY LYMPHADENECTOMY Right    lumpectomy and lymph nodes removed   PATCH ANGIOPLASTY Right 06/09/2019   Procedure: Patch Angioplasty of right carotid artery using hemashield paltinum finesse patch;  Surgeon: Elam Dutch, MD;  Location: [facility];  Service: Vascular;  Laterality: Right;   TUBAL LIGATION     VULVA SURGERY     Multiple times for dysplasia   Allergies  Allergen Reactions   Prednisone Palpitations   Fentanyl Other (See Comments)    MAKES SUPER HYPER PER PT   Midazolam Anxiety   Vicodin [Hydrocodone-Acetaminophen] Anxiety    Extreme anxiety.   Prior to Admission medications   Medication Sig Start Date End Date Taking? Authorizing Provider  alprazolam Duanne Moron) 2 MG tablet 1/2-1 tid prn anxiety. 04/19/20  Yes [name], MD  aspirin 81 MG chewable tablet Chew 81 mg by mouth daily.    Yes [provider]    atorvastatin (LIPITOR) 10 MG tablet Take 1 tablet (10 mg total) by mouth daily. 02/27/20 02/26/21 Yes Adrian Prows, MD  clopidogrel (PLAVIX) 75 MG tablet Take 1 tablet (75 mg total) by mouth daily. 03/07/20  Yes Adrian Prows, MD  cyclobenzaprine (FLEXERIL) 5 MG tablet Take 1-2 tablets (5-10 mg total) by mouth 3 (three) times daily as needed for muscle spasms. 07/03/19  Yes [name], MD  Garlic 297 MG CAPS Take 500 mg by mouth in the morning and at bedtime.    Yes [provider]  losartan-hydrochlorothiazide (HYZAAR) 50-12.5 MG tablet Take 0.5 tablets by mouth daily. 01/19/20  Yes [name], MD  nitroGLYCERIN (NITROSTAT) 0.4 MG SL tablet Place 0.4 mg under the tongue every 5 (five) minutes as needed for chest pain.   Yes [provider]  Omega-3 Fatty Acids (FISH OIL) 500 MG CAPS Take 500 mg by mouth in the morning and at bedtime.    Yes [provider]  Bempedoic Acid-Ezetimibe (NEXLIZET) 180-10 MG TABS Take 1 tablet by mouth daily. Patient not taking: Reported on 04/24/2020 03/07/20   Adrian Prows, MD  doxycycline (VIBRA-TABS) 100 MG tablet Take 100 mg by mouth 2 (two) times daily. 04/21/20   [provider]  propranolol ER (INDERAL LA) 60 MG 24 hr capsule Take 1 capsule (60 mg total) by mouth daily. Patient not taking: Reported on 04/24/2020 03/07/20   Adrian Prows, MD   Social History   Socioeconomic History   Marital status: Widowed    Spouse name: Not on file   Number of children: 2   Years of education: Not on file   Highest education level: Not on file  Occupational History   Occupation: server    Employer: [employer]  Tobacco Use   Smoking status: Former Smoker    Packs/day: 0.75    Years: 20.00    Pack years: 15.00    Types: Cigarettes    Quit date: 05/06/1997    Years since quitting: 22.9   Smokeless tobacco: Never Used  Vaping Use   Vaping Use: Never used  Substance and Sexual Activity   Alcohol use: Yes     Alcohol/week: 0.0 standard drinks    Comment: occ   Drug use: No   Sexual activity: Not Currently  Other Topics Concern   Not on file  Social History Narrative   Not on file   Social Determinants of Health   Financial Resource Strain:    Difficulty of Paying Living Expenses: Not on file  Food Insecurity:    Worried About [place] in the Last Year: Not on file   Ran Out of Food in the Last Year: Not on file  Transportation Needs:    Lack of Transportation (Medical): Not on file   Lack of Transportation (Non-Medical): Not on file  Physical Activity:    Days of Exercise per Week: Not on file   Minutes of Exercise per Session: Not on file  Stress:    Feeling of Stress : Not on file  Social Connections:    Frequency of Communication with Friends and Family: Not on file   Frequency of Social Gatherings with Friends and Family: Not on file   Attends Religious Services: Not on file   Active Member of Clubs or Organizations: Not on file   Attends Archivist Meetings: Not on file   Marital Status: Not on file  Intimate Partner Violence:    Fear of Current or Ex-Partner: Not on file   Emotionally Abused: Not on file   Physically Abused: Not on file   Sexually Abused: Not on file    Review of Systems   Objective:   Vitals:   04/24/20 1425 04/24/20 1432  BP: (!) 136/94 132/82  Pulse: 89   Temp: (!) 97.3 F (36.3 C)   TempSrc: Temporal   SpO2: 93%   Weight: 196 lb (88.9 kg)   Height: 5\' 8"  (1.727 m)      Physical Exam Vitals reviewed.  Constitutional:      Appearance: She is well-developed.  HENT:     Head: Normocephalic and atraumatic.  Eyes:  Conjunctiva/sclera: Conjunctivae normal.     Pupils: Pupils are equal, round, and reactive to light.  Neck:     Vascular: No carotid bruit.  Cardiovascular:     Rate and Rhythm: Normal rate and regular rhythm.     Heart sounds: Murmur (sem. ) heard.   Pulmonary:     Effort:  Pulmonary effort is normal.     Breath sounds: Normal breath sounds.  Abdominal:     Palpations: Abdomen is soft. There is no pulsatile mass.     Tenderness: There is no abdominal tenderness.  Skin:    General: Skin is warm and dry.  Neurological:     Mental Status: She is alert and oriented to person, place, and time.  Psychiatric:        Mood and Affect: Mood is anxious.      Assessment & Plan:  Madison West is a 64 y.o. female . Generalized anxiety disorder - Plan: propranolol (INDERAL) 10 MG tablet History of PSVT (paroxysmal supraventricular tachycardia) - Plan: propranolol (INDERAL) 10 MG tablet  - low dose propranolol trial, but recommended discussing other meds with cardiology.   - malaise, headache may be with decreased benzodiazepine dose. If persistent or any worsening symptoms, RTC/er precautions given.   Chronic low back pain, unspecified back pain laterality, unspecified whether sciatica present Chronic low back pain - Plan: cyclobenzaprine (FLEXERIL) 5 MG tablet Left-sided low back pain without sciatica, unspecified chronicity - Plan: cyclobenzaprine (FLEXERIL) 5 MG tablet  - refilled flexeril, rtc precautions if increased need or change in symptoms.   Meds ordered this encounter  Medications   propranolol (INDERAL) 10 MG tablet    Sig: Take 1 tablet (10 mg total) by mouth 3 (three) times daily.    Dispense:  90 tablet    Refill:  1   cyclobenzaprine (FLEXERIL) 5 MG tablet    Sig: Take 1 tablet (5 mg total) by mouth 3 (three) times daily as needed for muscle spasms.    Dispense:  30 tablet    Refill:  0   Patient Instructions    I will write low dose propanolol - 10mg  three times per day.  Some of your symptoms may also be from your lower dose of xanax as well, but please call your cardiologist about your recent symptoms as well.   I refilled flexeril, but if any worsening or change in your back pain - be seen.   Continue follow-up with psychiatry  regarding anxiety treatment, but I do recommend meeting with counseling.  Please let me know if you need more numbers for counseling.  Return to the clinic or go to the nearest emergency room if any of your symptoms worsen or new symptoms occur.   If you have lab work done today you will be contacted with your lab results within the next 2 weeks.  If you have not heard from [place] then please contact us. The fastest way to get your results is to register for My Chart.   IF you received an x-ray today, you will receive an invoice from [facility]. Please contact [facility] at [phone] with questions or concerns regarding your invoice.   IF you received labwork today, you will receive an invoice from [facility]. Please contact [facility] at [phone] with questions or concerns regarding your invoice.   Our billing staff will not be able to assist you with questions regarding bills from these companies.  You will be contacted with the lab results as soon  as they are available. The fastest way to get your results is to activate your My Chart account. Instructions are located on the last page of this paperwork. If you have not heard from [place] regarding the results in 2 weeks, please contact this office.         Signed, Merri Ray, MD [facility]

## 2020-05-20 DIAGNOSIS — I10 Essential (primary) hypertension: Secondary | ICD-10-CM | POA: Diagnosis not present

## 2020-05-20 DIAGNOSIS — E782 Mixed hyperlipidemia: Secondary | ICD-10-CM | POA: Diagnosis not present

## 2020-05-21 LAB — CBC
Hematocrit: 44.8 % (ref 34.0–46.6)
Hemoglobin: 15.4 g/dL (ref 11.1–15.9)
MCH: 31.6 pg (ref 26.6–33.0)
MCHC: 34.4 g/dL (ref 31.5–35.7)
MCV: 92 fL (ref 79–97)
Platelets: 432 10*3/uL (ref 150–450)
RBC: 4.88 x10E6/uL (ref 3.77–5.28)
RDW: 11.8 % (ref 11.7–15.4)
WBC: 7.4 10*3/uL (ref 3.4–10.8)

## 2020-05-21 LAB — CMP14+EGFR
ALT: 21 IU/L (ref 0–32)
AST: 19 IU/L (ref 0–40)
Albumin/Globulin Ratio: 1.9 (ref 1.2–2.2)
Albumin: 4.7 g/dL (ref 3.8–4.8)
Alkaline Phosphatase: 74 IU/L (ref 44–121)
BUN/Creatinine Ratio: 13 (ref 12–28)
BUN: 10 mg/dL (ref 8–27)
Bilirubin Total: 0.4 mg/dL (ref 0.0–1.2)
CO2: 24 mmol/L (ref 20–29)
Calcium: 9.9 mg/dL (ref 8.7–10.3)
Chloride: 101 mmol/L (ref 96–106)
Creatinine, Ser: 0.78 mg/dL (ref 0.57–1.00)
GFR calc Af Amer: 92 mL/min/{1.73_m2} (ref >59)
GFR calc non Af Amer: 79 mL/min/{1.73_m2} (ref >59)
Globulin, Total: 2.5 g/dL (ref 1.5–4.5)
Glucose: 113 mg/dL — ABNORMAL HIGH (ref 65–99)
Potassium: 3.9 mmol/L (ref 3.5–5.2)
Sodium: 141 mmol/L (ref 134–144)
Total Protein: 7.2 g/dL (ref 6.0–8.5)

## 2020-05-21 LAB — LIPID PANEL WITH LDL/HDL RATIO
Cholesterol, Total: 226 mg/dL — ABNORMAL HIGH (ref 100–199)
HDL: 62 mg/dL (ref >39)
LDL Chol Calc (NIH): 143 mg/dL — ABNORMAL HIGH (ref 0–99)
LDL/HDL Ratio: 2.3 ratio (ref 0.0–3.2)
Triglycerides: 121 mg/dL (ref 0–149)
VLDL Cholesterol Cal: 21 mg/dL (ref 5–40)

## 2020-05-22 ENCOUNTER — Other Ambulatory Visit: Payer: Self-pay

## 2020-05-22 ENCOUNTER — Encounter: Payer: Self-pay | Admitting: Cardiology

## 2020-05-22 ENCOUNTER — Ambulatory Visit: Payer: Medicare Other | Admitting: Cardiology

## 2020-05-22 VITALS — BP 153/94 | HR 85 | Resp 16 | Ht 68.0 in | Wt 198.6 lb

## 2020-05-22 DIAGNOSIS — I10 Essential (primary) hypertension: Secondary | ICD-10-CM | POA: Diagnosis not present

## 2020-05-22 DIAGNOSIS — F411 Generalized anxiety disorder: Secondary | ICD-10-CM | POA: Diagnosis not present

## 2020-05-22 DIAGNOSIS — I25118 Atherosclerotic heart disease of native coronary artery with other forms of angina pectoris: Secondary | ICD-10-CM | POA: Diagnosis not present

## 2020-05-22 DIAGNOSIS — I6521 Occlusion and stenosis of right carotid artery: Secondary | ICD-10-CM | POA: Diagnosis not present

## 2020-05-22 DIAGNOSIS — Z9889 Other specified postprocedural states: Secondary | ICD-10-CM

## 2020-05-22 DIAGNOSIS — E782 Mixed hyperlipidemia: Secondary | ICD-10-CM

## 2020-05-22 DIAGNOSIS — R0989 Other specified symptoms and signs involving the circulatory and respiratory systems: Secondary | ICD-10-CM

## 2020-05-22 MED ORDER — ATORVASTATIN CALCIUM 10 MG PO TABS: 10.0000 mg | ORAL_TABLET | Freq: Every day | ORAL | 3 refills | Status: DC

## 2020-05-22 NOTE — Progress Notes (Signed)
Primary Physician/Referring:  [name], MD  Patient ID: Madison West, female    DOB: 09-13-1955, 64 y.o.   MRN: [id number]  Chief Complaint  Patient presents with  . Coronary Artery Disease  . Follow-up    10 weeks    HPI: Madison West  is a 64 y.o. female   history of PSVT on 08/04/2016, terminated with adenosine, anxiety and depression, history of breast cancer status post radiation therapy and chemotherapy in 2009, hypertension, mild aortic valve stenosis, hyperlipidemia, palpitations, asymptomatic right carotid artery stenosis SP right carotid endarterectomy on 06/09/2019, cardiac disease s/p stenting to RI on 02/27/2020 and has residual circumflex 70 to 80% stenosis being managed medically, extreme anxiety and multiple medication intolerances and phobia.  This is a 71-month office visit.  She underwent lipid profile testing and presents for follow-up.  States that she is presently doing well and does not have the tightness in her chest with exertion activity like she used to have before but continues to have occasional episodes of sharp pain when she gets anxious.  She is not happy taking the medications.  Past Medical History:  Diagnosis Date  . Anxiety   . Arthritis    low back and hip pain intermittent  . Breast cancer ([place]) 06/02/07   r breast -surgery ,radiaology. chemotherapy  . Carpal tunnel syndrome    right hand  . Colon polyps   . Complication of anesthesia    Fentanyl, Versed-makes extra hyper, bradycardia x 1 in PACU, [facility] (08/15/11 cardiology felt neostigmine may have resulted in AV nodal block)   . Coronary artery disease   . Depression    denies  . Dysplasia of vulva   . Hypertension   . Palpitations    PSVT, s/p adenosine 08/04/16  . S/P breast lumpectomy 07/04/07   R breast  . S/P radiation therapy 2009    Past Surgical History:  Procedure Laterality Date  . APPENDECTOMY     age 86  . CESAREAN SECTION     x 2  . COLONOSCOPY W/  POLYPECTOMY    . COLONOSCOPY WITH PROPOFOL N/A 04/27/2016   Procedure: COLONOSCOPY WITH PROPOFOL;  Surgeon: Garlan Fair, MD;  Location: [facility];  Service: Endoscopy;  Laterality: N/A;  . DILATION AND CURETTAGE OF UTERUS     multiple  . ENDARTERECTOMY Right 06/09/2019  . ENDARTERECTOMY Right 06/09/2019   Procedure: ENDARTERECTOMY CAROTID RIGHT;  Surgeon: Elam Dutch, MD;  Location: [place];  Service: Vascular;  Laterality: Right;  . INTRAUTERINE DEVICE INSERTION    . IUD REMOVAL    . LEFT HEART CATH AND CORONARY ANGIOGRAPHY N/A 02/27/2020   Procedure: LEFT HEART CATH AND CORONARY ANGIOGRAPHY;  Surgeon: Adrian Prows, MD;  Location: [place] [facility];  Service: Cardiovascular;  Laterality: N/A;  . MASTECTOMY PARTIAL / LUMPECTOMY W/ AXILLARY LYMPHADENECTOMY Right    lumpectomy and lymph nodes removed  . PATCH ANGIOPLASTY Right 06/09/2019   Procedure: Patch Angioplasty of right carotid artery using hemashield paltinum finesse patch;  Surgeon: Elam Dutch, MD;  Location: [facility] OR;  Service: Vascular;  Laterality: Right;  . TUBAL LIGATION    . VULVA SURGERY     Multiple times for dysplasia    Social History   Tobacco Use  . Smoking status: Former Smoker    Packs/day: 0.75    Years: 20.00    Pack years: 15.00    Types: Cigarettes    Quit date: 05/06/1997    Years  since quitting: 23.0  . Smokeless tobacco: Never Used  Substance Use Topics  . Alcohol use: Yes    Alcohol/week: 0.0 standard drinks    Comment: occ   Marital Status: Widowed   Current Outpatient Medications on File Prior to Visit  Medication Sig Dispense Refill  . alprazolam (XANAX) 2 MG tablet 1/2-1 tid prn anxiety. 15 tablet 0  . aspirin 81 MG chewable tablet Chew 81 mg by mouth daily.     . clopidogrel (PLAVIX) 75 MG tablet Take 1 tablet (75 mg total) by mouth daily. 90 tablet 2  . cyclobenzaprine (FLEXERIL) 5 MG tablet Take 1 tablet (5 mg total) by mouth 3 (three) times daily as needed for muscle spasms.  30 tablet 0  . Garlic 703 MG CAPS Take 500 mg by mouth in the morning and at bedtime.     Marland Kitchen losartan-hydrochlorothiazide (HYZAAR) 50-12.5 MG tablet Take 0.5 tablets by mouth daily. 45 tablet 1  . nitroGLYCERIN (NITROSTAT) 0.4 MG SL tablet Place 0.4 mg under the tongue every 5 (five) minutes as needed for chest pain.    . Omega-3 Fatty Acids (FISH OIL) 500 MG CAPS Take 500 mg by mouth in the morning and at bedtime.      No current facility-administered medications on file prior to visit.   Review of Systems  Cardiovascular: Positive for chest pain and palpitations. Negative for dyspnea on exertion and leg swelling.  Musculoskeletal: Positive for joint pain.  Gastrointestinal: Negative for melena.  Psychiatric/Behavioral: Positive for depression and hypervigilance. The patient is nervous/anxious.    Objective  Blood pressure (!) 153/94, pulse 85, resp. rate 16, height 5\' 8"  (1.727 m), weight 198 lb 9.6 oz (90.1 kg), SpO2 96 %. Body mass index is 30.2 kg/m.   Vitals with BMI 05/22/2020 04/24/2020 04/24/2020  Height 5\' 8"  - 5\' 8"   Weight 198 lbs 10 oz - 196 lbs  BMI 50.0 - 93.81  Systolic 829 937 169  Diastolic 94 82 94  Pulse 85 - 89    Physical Exam Constitutional:      Appearance: She is well-developed.     Comments: Mildly obese  HENT:     Head: Atraumatic.  Neck:     Comments: Right carotid endarterectomy scar Cardiovascular:     Rate and Rhythm: Normal rate and regular rhythm.     Pulses: Intact distal pulses.          Carotid pulses are on the right side with bruit and on the left side with bruit.    Heart sounds: Murmur heard.  Midsystolic murmur is present with a grade of 3/6 at the upper right sternal border.      Comments: Right CEA scar noted. No JVD, no leg edema.  Mild ecchymosis right forearm. Pulmonary:     Effort: Pulmonary effort is normal. No accessory muscle usage or respiratory distress.     Breath sounds: Normal breath sounds.  Abdominal:     General:  Bowel sounds are normal.     Palpations: Abdomen is soft.  Neurological:     Mental Status: She is oriented to person, place, and time.    Laboratory examination:   CMP Latest Ref Rng & Units 05/20/2020 02/23/2020 01/19/2020  Glucose 65 - 99 mg/dL 113(H) 99 112(H)  BUN 8 - 27 mg/dL 10 9 11   Creatinine 0.57 - 1.00 mg/dL 0.78 0.64 0.67  Sodium 134 - 144 mmol/L 141 133(L) 135  Potassium 3.5 - 5.2 mmol/L 3.9 4.4 4.2  Chloride  96 - 106 mmol/L 101 94(L) 97  CO2 20 - 29 mmol/L 24 24 25   Calcium 8.7 - 10.3 mg/dL 9.9 10.0 10.0  Total Protein 6.0 - 8.5 g/dL 7.2 - 7.0  Total Bilirubin 0.0 - 1.2 mg/dL 0.4 - 0.5  Alkaline Phos 44 - 121 IU/L 74 - 64  AST 0 - 40 IU/L 19 - 18  ALT 0 - 32 IU/L 21 - 17   CBC Latest Ref Rng & Units 05/20/2020 02/23/2020 01/19/2020  WBC 3.4 - 10.8 x10E3/uL 7.4 9.4 7.3  Hemoglobin 11.1 - 15.9 g/dL 15.4 15.2 15.4  Hematocrit 34.0 - 46.6 % 44.8 45.0 45.7  Platelets 150 - 450 x10E3/uL 432 446 394   Lipid Panel Recent Labs    01/19/20 1434 05/20/20 1406  CHOL 253* 226*  TRIG 138 121  LDLCALC 168* 143*  HDL 60 62  CHOLHDL 4.2  --      HEMOGLOBIN A1C Lab Results  Component Value Date   HGBA1C 5.8 (H) 10/15/2017   MPG 114 10/06/2011   TSH in my chart okay, no complaint No results for input(s): TSH in the last 8760 hours. Cardiac Studies:   Exercise sestamibi stress test 04/04/2018: 1. The resting electrocardiogram demonstrated normal sinus rhythm, LAD, LAFB, incomplete RBBB, poor R progression and no resting arrhythmias. The stress electrocardiogram was normal. Patient exercised on Bruce protocol for 4:55 minutes and achieved 6.95 METS. Stress test terminated due to chest pressure and 87% MPHR achieved (Target HR >85%). Resting BP 150/88 and pea BP 218/102 mm hg. 2. Myocardial perfusion imaging is normal. Overall left ventricular systolic function was normal without regional wall motion abnormalities. The left ventricular ejection fraction was 72%. This is a  low risk study. Chest pain could be related to hypertensive heart disease. Clinical correlation recommended.  Event monitor 30 days 03/11/2018: Episodic transmission 03/20/2018 1:30 pm: 5 beat NSVT.One brief atrial tach @ 142/min. Frequent PAC in bigeminal pattern, symptomatic. Rare PVC.  Echocardiogram 03/08/2018: Left ventricle cavity is normal in size. Moderate concentric hypertrophy of the left ventricle. Normal global wall motion. Doppler evidence of grade I (impaired) diastolic dysfunction, elevated LAP. Calculated EF 60%. Mild aortic valve leaflet calcification. Severely restricted aortic valve leaflets. Mild aortic valve stenosis. Trace aortic regurgitation. Aortic valve peak pressure gradient of 23 and mean gradient of 13 mmHg, calculated aortic valve area 1.59 cm. In 2-D, the valve appears to be severely restricted. Compared to the echocardiogram 09/09/2016, mean gradient has increased from 13 mmHg. Otherwise no significant change. Consider TEE if clinically indicated.  Carotid artery duplex  11/01/2019:  Right Carotid: Velocities in the right ICA are consistent with a 1-39%  stenosis.  Status post endarterectomy.   Left Carotid: Velocities in the left ICA are consistent with a 1-39% stenosis.  Non-hemodynamically significant plaque <50% noted in the CCA.   Vertebrals: Right vertebral artery demonstrates antegrade flow. Left vertebral  artery was not visualized.  Subclavians: Normal flow hemodynamics were seen in bilateral subclavian  arteries.   Left Heart Catheterization 02/27/20:  Normal LVEDP.  No pressure gradient across the aortic valve. RCA: Dominant vessel.  Mild diffuse disease. Left main: Large caliber vessel, mild calcification evident. LAD: Large caliber vessel.  Mild diffuse luminal irregularity. Circumflex: Small vessel, continues in the AV groove with a mid 80% stenosis. Ramus intermediate: It is a large vessel, gives origin to large lateral ramus branches, diffusely  diseased, proximal segment has a 80 to 90% mildly calcified concentric stenosis.  Successful stenting with 3.0  x 18 mm resolute Onyx, distal end of the stent was in the diffusely diseased vessel and there was a type B dissection evident and the vessel was much larger than anticipated distally due to underfilling.  This was stented with a 3.0 x 12 mm resolute Onyx.  Overall stenosis reduced from diffusely diseased 90% to 0% with TIMI-3 to TIMI-3 flow maintained.   Recommendation: I have discussed with the patient regarding use of statins which is paramount.  She will be discharged home on aspirin along with Plavix.  135 mL contrast utilized.  EKG:  EKG 03/07/2020: Sinus rhythm with first-degree block at rate of 76 bpm, left axis deviation, left anterior fascicular block.  Incomplete right bundle branch block.  Poor R wave progression, probably normal variant but cannot exclude anteroseptal infarct old.  No evidence of ischemia, normal QT interval.  No significant change from  EKG 01/26/2018.  Assessment     ICD-10-CM   1. Coronary artery disease of native artery of native heart with stable angina pectoris (HCC)  I25.118 atorvastatin (LIPITOR) 10 MG tablet  2. Essential hypertension  I10   3. Mixed hyperlipidemia  E78.2 atorvastatin (LIPITOR) 10 MG tablet  4. History of right-sided carotid endarterectomy  Z98.890 PCV CAROTID DUPLEX (BILATERAL)  5. Bilateral carotid bruits  R09.89 PCV CAROTID DUPLEX (BILATERAL)  6. Generalized anxiety disorder  F41.1    Meds ordered this encounter  Medications  . atorvastatin (LIPITOR) 10 MG tablet    Sig: Take 1 tablet (10 mg total) by mouth daily.    Dispense:  90 tablet    Refill:  3    Medications Discontinued During This Encounter  Medication Reason  . Bempedoic Acid-Ezetimibe (NEXLIZET) 180-10 MG TABS Cost of medication  . doxycycline (VIBRA-TABS) 100 MG tablet Completed Course  . propranolol (INDERAL) 10 MG tablet Patient Preference  . atorvastatin  (LIPITOR) 10 MG tablet Reorder      Recommendations:   Madison West  is a 64 y.o. history of PSVT on 08/04/2016, terminated with adenosine, anxiety and depression, history of breast cancer status post radiation therapy and chemotherapy in 2009, hypertension, mild aortic valve stenosis, hyperlipidemia, palpitations, asymptomatic right carotid artery stenosis SP right carotid endarterectomy on 06/09/2019, cardiac disease s/p stenting to RI on 02/27/2020 and has residual circumflex 70 to 80% stenosis being managed medically, extreme anxiety and multiple medication intolerances and phobia.   I had seen her 3 months ago and after long discussion she had agreed to take 10 mg of Lipitor.  I reviewed her lipid profile testing, obviously the LDL is not at goal.  She is not willing to increase the dose in fact wanted to come off of the medication.  We have agreed upon continuing with low-dose statin.  She does not want to try any other medications with the statin.  Her blood pressure is elevated, however she states that blood pressure is well controlled, she is in extreme stress and anxious hence does not want me to change any medications.  She continues to have occasional episodes of chest pain but states that it is different from her anginal symptoms and does not want to try nitroglycerin as it gives her headaches.  She has not had any rest pain or exertional chest discomfort but only gets chest pain with anxiety.  For CAD, hypertension, anxiety at prescribed her propranolol XL 60 mg, she wanted to try a low-dose of 10 mg of plain propranolol, it is still not been mailed to her from  her [facility] and she is still afraid to take this medication.  I will leave it to her to make the decision.  She has had carotid endarterectomy and does have faint bilateral carotid bruit, she needs surveillance carotid artery duplex, I will schedule this for the next 2 to 3 months and see her back at that time.  This was a  40-minute encounter.   Adrian Prows, MD, [facility] 05/22/2020, 12:34 PM Office: [phone]

## 2020-05-28 DIAGNOSIS — Z1231 Encounter for screening mammogram for malignant neoplasm of breast: Secondary | ICD-10-CM | POA: Diagnosis not present

## 2020-06-11 ENCOUNTER — Telehealth: Payer: Self-pay | Admitting: Family Medicine

## 2020-06-11 DIAGNOSIS — F411 Generalized anxiety disorder: Secondary | ICD-10-CM

## 2020-06-11 NOTE — Telephone Encounter (Signed)
Ordered and faxed to their office

## 2020-06-11 NOTE — Telephone Encounter (Signed)
Patient is calling upset because she thought she  was supposed to have a referral  Put in and patient is wanting to go to  Patient called to set her own appt with Threasa Beards with [facility] in [facility]  Patient has an appt at [employer] and thought that he would be putting in a referral   Number for th Minda Meo  [place]    Please advise

## 2020-06-12 DIAGNOSIS — F411 Generalized anxiety disorder: Secondary | ICD-10-CM | POA: Diagnosis not present

## 2020-07-15 ENCOUNTER — Ambulatory Visit: Payer: Medicare Other | Admitting: Cardiology

## 2020-07-26 ENCOUNTER — Ambulatory Visit: Payer: Medicare Other | Admitting: Family Medicine

## 2020-08-08 DIAGNOSIS — F411 Generalized anxiety disorder: Secondary | ICD-10-CM | POA: Diagnosis not present

## 2020-08-13 ENCOUNTER — Ambulatory Visit: Payer: Medicare Other

## 2020-08-13 ENCOUNTER — Other Ambulatory Visit: Payer: Self-pay

## 2020-08-13 DIAGNOSIS — R0989 Other specified symptoms and signs involving the circulatory and respiratory systems: Secondary | ICD-10-CM

## 2020-08-13 DIAGNOSIS — Z9889 Other specified postprocedural states: Secondary | ICD-10-CM

## 2020-08-30 ENCOUNTER — Other Ambulatory Visit: Payer: Self-pay

## 2020-08-30 ENCOUNTER — Ambulatory Visit: Payer: Medicare Other | Admitting: Cardiology

## 2020-08-30 ENCOUNTER — Encounter: Payer: Self-pay | Admitting: Cardiology

## 2020-08-30 VITALS — BP 127/86 | HR 91 | Temp 98.2°F | Resp 16 | Ht 68.0 in | Wt 198.4 lb

## 2020-08-30 DIAGNOSIS — I25118 Atherosclerotic heart disease of native coronary artery with other forms of angina pectoris: Secondary | ICD-10-CM

## 2020-08-30 DIAGNOSIS — I10 Essential (primary) hypertension: Secondary | ICD-10-CM

## 2020-08-30 DIAGNOSIS — Z9889 Other specified postprocedural states: Secondary | ICD-10-CM

## 2020-08-30 DIAGNOSIS — E782 Mixed hyperlipidemia: Secondary | ICD-10-CM

## 2020-08-30 MED ORDER — CLOPIDOGREL BISULFATE 75 MG PO TABS: 75.0000 mg | ORAL_TABLET | Freq: Every day | ORAL | 3 refills | Status: DC

## 2020-08-30 MED ORDER — LOSARTAN POTASSIUM-HCTZ 50-12.5 MG PO TABS: 0.5000 | ORAL_TABLET | Freq: Every day | ORAL | 3 refills | Status: DC

## 2020-08-30 NOTE — Progress Notes (Signed)
Primary Physician/Referring:  [name], MD  Patient ID: Madison West, female    DOB: June 08, 1956, 65 y.o.   MRN: [id number]  Chief Complaint  Patient presents with  . Coronary Artery Disease  . Carotid Stenosis  . Follow-up    3 months    HPI: Madison West  is a 65 y.o. female   history of PSVT on 08/04/2016, terminated with adenosine, anxiety and depression, history of breast cancer status post radiation therapy and chemotherapy in 2009, hypertension, mild aortic valve stenosis, hyperlipidemia, palpitations, asymptomatic right carotid artery stenosis SP right carotid endarterectomy on 06/09/2019, cardiac disease s/p stenting to RI on 02/27/2020 and has residual circumflex 70 to 80% stenosis being managed medically, extreme anxiety and multiple medication intolerances and phobia.  This is a 75-monthoffice visit.  States that she is presently doing well and does not have the tightness in her chest with exertion activity. She is not happy taking the medications. Remains asymptomatic from cardiac standpoint.   Past Medical History:  Diagnosis Date  . Anxiety   . Arthritis    low back and hip pain intermittent  . Breast cancer ([facility] 06/02/07   r breast -surgery ,radiaology. chemotherapy  . Carpal tunnel syndrome    right hand  . Colon polyps   . Complication of anesthesia    Fentanyl, Versed-makes extra hyper, [facility] cardiology felt neostigmine may have resulted in AV nodal block)   . Coronary artery disease   . Depression    denies  . Dysplasia of vulva   . Hypertension   . Palpitations    PSVT, s/p adenosine 08/04/16  . S/P breast lumpectomy 07/04/07   R breast  . S/P radiation therapy 2009    Past Surgical History:  Procedure Laterality Date  . APPENDECTOMY     age 65 . CESAREAN SECTION     x 2  . COLONOSCOPY W/ POLYPECTOMY    . COLONOSCOPY WITH PROPOFOL N/A 04/27/2016   Procedure: COLONOSCOPY WITH PROPOFOL;  Surgeon:  MGarlan Fair MD;  [facility]: [facility];  Service: Endoscopy;  Laterality: N/A;  . DILATION AND CURETTAGE OF UTERUS     multiple  . ENDARTERECTOMY Right 06/09/2019  . ENDARTERECTOMY Right 06/09/2019   Procedure: ENDARTERECTOMY CAROTID RIGHT;  Surgeon: FElam Dutch MD;  [facility]  Service: Vascular;  Laterality: Right;  . INTRAUTERINE DEVICE INSERTION    . IUD REMOVAL    . LEFT HEART CATH AND CORONARY ANGIOGRAPHY N/A 02/27/2020   Procedure: LEFT HEART CATH AND CORONARY ANGIOGRAPHY;  Surgeon: GAdrian Prows MD;  [facility];  Service: Cardiovascular;  Laterality: N/A;  . MASTECTOMY PARTIAL / LUMPECTOMY W/ AXILLARY LYMPHADENECTOMY Right    lumpectomy and lymph nodes removed  . PATCH ANGIOPLASTY Right 06/09/2019   Procedure: Patch Angioplasty of right carotid artery using hemashield paltinum finesse patch;  Surgeon: FElam Dutch MD;  [facility];  Service: Vascular;  Laterality: Right;  . TUBAL LIGATION    . VULVA SURGERY     Multiple times for dysplasia    Social History   Tobacco Use  . Smoking status: Former Smoker    Packs/day: 0.75    Years: 20.00    Pack years: 15.00    Types: Cigarettes    Quit date: 05/06/1997    Years since quitting: 23.3  . Smokeless tobacco: Never Used  Substance Use Topics  . Alcohol use: Yes  Alcohol/week: 0.0 standard drinks    Comment: occ   Marital Status: Widowed   Current Outpatient Medications on File Prior to Visit  Medication Sig Dispense Refill  . alprazolam (XANAX) 2 MG tablet 1/2-1 tid prn anxiety. 15 tablet 0  . aspirin 81 MG chewable tablet Chew 81 mg by mouth daily.     Marland Kitchen atorvastatin (LIPITOR) 10 MG tablet Take 1 tablet (10 mg total) by mouth daily. 90 tablet 3  . cyclobenzaprine (FLEXERIL) 5 MG tablet Take 1 tablet (5 mg total) by mouth 3 (three) times daily as needed for muscle spasms. 30 tablet 0  . Garlic 417 MG CAPS Take 500 mg by mouth in the morning and at bedtime.     . nitroGLYCERIN  (NITROSTAT) 0.4 MG SL tablet Place 0.4 mg under the tongue every 5 (five) minutes as needed for chest pain.    . Omega-3 Fatty Acids (FISH OIL) 500 MG CAPS Take 500 mg by mouth in the morning and at bedtime.      No current facility-administered medications on file prior to visit.   Review of Systems  Cardiovascular: Negative for chest pain, dyspnea on exertion, leg swelling and palpitations.  Musculoskeletal: Positive for joint pain.  Gastrointestinal: Negative for melena.  Psychiatric/Behavioral: Positive for depression and hypervigilance. The patient is nervous/anxious.    Objective  Blood pressure 127/86, pulse 91, temperature 98.2 F (36.8 C), temperature source Temporal, resp. rate 16, height _0  (1.727 m), weight 198 lb 6.4 oz (90 kg), SpO2 97 %. Body mass index is 30.17 kg/m.   Vitals with BMI 08/30/2020 05/22/2020 04/24/2020  Height _1  _2  -  Weight 198 lbs 6 oz 198 lbs 10 oz -  BMI 40.81 44.8 -  Systolic 185 631 497  Diastolic 86 94 82  Pulse 91 85 -    Physical Exam Constitutional:      Appearance: She is well-developed.     Comments: Mildly obese  HENT:     Head: Atraumatic.  Neck:     Comments: Right carotid endarterectomy scar Cardiovascular:     Rate and Rhythm: Normal rate and regular rhythm.     Pulses: Intact distal pulses.          Carotid pulses are on the right side with bruit and on the left side with bruit.    Heart sounds: Murmur heard.   Midsystolic murmur is present with a grade of 2/6 at the upper right sternal border.     Comments: Right CEA scar noted.  No JVD, no leg edema.  Pulmonary:     Effort: Pulmonary effort is normal. No accessory muscle usage or respiratory distress.     Breath sounds: Normal breath sounds.  Abdominal:     General: Bowel sounds are normal.     Palpations: Abdomen is soft.  Neurological:     Mental Status: She is oriented to person, place, and time.    Laboratory examination:   CMP Latest Ref Rng & Units  05/20/2020 02/23/2020 01/19/2020  Glucose 65 - 99 mg/dL 113(H) 99 112(H)  BUN 8 - 27 mg/dL _3 Creatinine 0.57 - 1.00 mg/dL 0.78 0.64 0.67  Sodium 134 - 144 mmol/L 141 133(L) 135  Potassium 3.5 - 5.2 mmol/L 3.9 4.4 4.2  Chloride 96 - 106 mmol/L 101 94(L) 97  CO2 20 - 29 mmol/L _4 Calcium 8.7 - 10.3 mg/dL 9.9 10.0 10.0  Total Protein 6.0 - 8.5 g/dL 7.2 -  7.0  Total Bilirubin 0.0 - 1.2 mg/dL 0.4 - 0.5  Alkaline Phos 44 - 121 IU/L 74 - 64  AST 0 - 40 IU/L 19 - 18  ALT 0 - 32 IU/L 21 - 17   CBC Latest Ref Rng & Units 05/20/2020 02/23/2020 01/19/2020  WBC 3.4 - 10.8 x10E3/uL 7.4 9.4 7.3  Hemoglobin 11.1 - 15.9 g/dL 15.4 15.2 15.4  Hematocrit 34.0 - 46.6 % 44.8 45.0 45.7  Platelets 150 - 450 x10E3/uL 432 446 394   Lipid Panel Recent Labs    01/19/20 1434 05/20/20 1406  CHOL 253* 226*  TRIG 138 121  LDLCALC 168* 143*  HDL 60 62  CHOLHDL 4.2  --      HEMOGLOBIN A1C Lab Results  Component Value Date   HGBA1C 5.8 (H) 10/15/2017   MPG 114 10/06/2011   TSH in my chart okay, no complaint No results for input(s): TSH in the last 8760 hours.   Cardiac Studies:   Exercise sestamibi stress test 04/04/2018: 1. The resting electrocardiogram demonstrated normal sinus rhythm, LAD, LAFB, incomplete RBBB, poor R progression and no resting arrhythmias. The stress electrocardiogram was normal. Patient exercised on Bruce protocol for 4:55 minutes and achieved 6.95 METS. Stress test terminated due to chest pressure and 87% MPHR achieved (Target HR >85%). Resting BP 150/88 and pea BP 218/102 mm hg. 2. Myocardial perfusion imaging is normal. Overall left ventricular systolic function was normal without regional wall motion abnormalities. The left ventricular ejection fraction was 72%. This is a low risk study. Chest pain could be related to hypertensive heart disease. Clinical correlation recommended.  Event monitor 30 days 03/11/2018: Episodic transmission 03/20/2018 1:30 pm: 5 beat  NSVT.One brief atrial tach @ 142/min. Frequent PAC in bigeminal pattern, symptomatic. Rare PVC.  Echocardiogram 03/08/2018: Left ventricle cavity is normal in size. Moderate concentric hypertrophy of the left ventricle. Normal global wall motion. Doppler evidence of grade I (impaired) diastolic dysfunction, elevated LAP. Calculated EF 60%. Mild aortic valve leaflet calcification. Severely restricted aortic valve leaflets. Mild aortic valve stenosis. Trace aortic regurgitation. Aortic valve peak pressure gradient of 23 and mean gradient of 13 mmHg, calculated aortic valve area 1.59 cm. In 2-D, the valve appears to be severely restricted. Compared to the echocardiogram 09/09/2016, mean gradient has increased from 13 mmHg. Otherwise no significant change. Consider TEE if clinically indicated.  Left Heart Catheterization 02/27/20:  Normal LVEDP.  No pressure gradient across the aortic valve. RCA: Dominant vessel.  Mild diffuse disease. Left main: Large caliber vessel, mild calcification evident. LAD: Large caliber vessel.  Mild diffuse luminal irregularity. Circumflex: Small vessel, continues in the AV groove with a mid 80% stenosis. Ramus intermediate: It is a large vessel, gives origin to large lateral ramus branches, diffusely diseased, proximal segment has a 80 to 90% mildly calcified concentric stenosis.  Successful stenting with 3.0 x 18 mm resolute Onyx, distal end of the stent was in the diffusely diseased vessel and there was a type B dissection evident and the vessel was much larger than anticipated distally due to underfilling.  This was stented with a 3.0 x 12 mm resolute Onyx.  Overall stenosis reduced from diffusely diseased 90% to 0% with TIMI-3 to TIMI-3 flow maintained.   Recommendation: I have discussed with the patient regarding use of statins which is paramount.  She will be discharged home on aspirin along with Plavix.  135 mL contrast utilized.  Carotid artery duplex 08/13/2020: No  hemodynamically significant arterial disease in the internal carotid  artery bilaterally. Right carotid endarterectomy is widely patent. Antegrade right vertebral artery flow. Antegrade left vertebral artery flow. Follow up studies if clinically indicated. No significant change from 11/01/2019.  EKG:  EKG 03/07/2020: Sinus rhythm with first-degree block at rate of 76 bpm, left axis deviation, left anterior fascicular block.  Incomplete right bundle branch block.  Poor R wave progression, probably normal variant but cannot exclude anteroseptal infarct old.  No evidence of ischemia, normal QT interval.  No significant change from  EKG 01/26/2018.  Assessment     ICD-10-CM   1. Coronary artery disease of native artery of native heart with stable angina pectoris (HCC)  I25.118 clopidogrel (PLAVIX) 75 MG tablet    CMP14+EGFR    CBC  2. Essential hypertension  I10 losartan-hydrochlorothiazide (HYZAAR) 50-12.5 MG tablet    TSH  3. History of right-sided carotid endarterectomy  Z98.890   4. Mixed hyperlipidemia  E78.2 Lipid Panel With LDL/HDL Ratio   Meds ordered this encounter  Medications  . clopidogrel (PLAVIX) 75 MG tablet    Sig: Take 1 tablet (75 mg total) by mouth daily.    Dispense:  90 tablet    Refill:  3  . losartan-hydrochlorothiazide (HYZAAR) 50-12.5 MG tablet    Sig: Take 0.5 tablets by mouth daily.    Dispense:  90 tablet    Refill:  3    Medications Discontinued During This Encounter  Medication Reason  . losartan-hydrochlorothiazide (HYZAAR) 50-12.5 MG tablet Reorder  . clopidogrel (PLAVIX) 75 MG tablet Reorder      Recommendations:   Shamika Pedregon  is a 65 y.o. history of PSVT on 08/04/2016, terminated with adenosine, anxiety and depression, history of breast cancer status post radiation therapy and chemotherapy in 2009, hypertension, mild aortic valve stenosis, hyperlipidemia, palpitations, asymptomatic right carotid artery stenosis SP right carotid endarterectomy on  06/09/2019, cardiac disease s/p stenting to RI on 02/27/2020 and has residual circumflex 70 to 80% stenosis being managed medically, extreme anxiety and multiple medication intolerances and phobia.   She remains completely asymptomatic with regard to cardiac issues.  I am glad that she is still taking statins although it is at a very low dose.  I would like to repeat labs including CMP, CBC lipid profile testing and TSH.  Since being on losartan, blood pressure is also now normalized.  I reviewed her carotid artery duplex, and even after 1 year of endarterectomy, there is no evidence to show there is recurrence of carotid stenosis.  I have reassured her.  Otherwise no changes in the medications were done today, I would like to continue dual antiplatelet therapy although she has finished 1 year, at least for another 6 months and consider discontinuing this on her next office visit.    Adrian Prows, MD, [facility] 08/31/2020, 3:00 PM Office: [phone]

## 2020-08-31 ENCOUNTER — Encounter: Payer: Self-pay | Admitting: Cardiology

## 2020-10-24 DIAGNOSIS — F411 Generalized anxiety disorder: Secondary | ICD-10-CM | POA: Diagnosis not present

## 2021-01-13 DIAGNOSIS — Z79891 Long term (current) use of opiate analgesic: Secondary | ICD-10-CM | POA: Diagnosis not present

## 2021-01-13 DIAGNOSIS — Z79899 Other long term (current) drug therapy: Secondary | ICD-10-CM | POA: Diagnosis not present

## 2021-01-23 DIAGNOSIS — F411 Generalized anxiety disorder: Secondary | ICD-10-CM | POA: Diagnosis not present

## 2021-02-12 DIAGNOSIS — W5501XA Bitten by cat, initial encounter: Secondary | ICD-10-CM | POA: Diagnosis not present

## 2021-02-12 DIAGNOSIS — S51839A Puncture wound without foreign body of unspecified forearm, initial encounter: Secondary | ICD-10-CM | POA: Diagnosis not present

## 2021-02-13 ENCOUNTER — Other Ambulatory Visit: Payer: Self-pay

## 2021-02-13 ENCOUNTER — Encounter (HOSPITAL_BASED_OUTPATIENT_CLINIC_OR_DEPARTMENT_OTHER): Payer: Self-pay | Admitting: *Deleted

## 2021-02-13 DIAGNOSIS — F32A Depression, unspecified: Secondary | ICD-10-CM | POA: Diagnosis present

## 2021-02-13 DIAGNOSIS — Z8249 Family history of ischemic heart disease and other diseases of the circulatory system: Secondary | ICD-10-CM

## 2021-02-13 DIAGNOSIS — Z923 Personal history of irradiation: Secondary | ICD-10-CM

## 2021-02-13 DIAGNOSIS — S51851A Open bite of right forearm, initial encounter: Secondary | ICD-10-CM | POA: Diagnosis not present

## 2021-02-13 DIAGNOSIS — Z7982 Long term (current) use of aspirin: Secondary | ICD-10-CM

## 2021-02-13 DIAGNOSIS — Z79899 Other long term (current) drug therapy: Secondary | ICD-10-CM

## 2021-02-13 DIAGNOSIS — Z7902 Long term (current) use of antithrombotics/antiplatelets: Secondary | ICD-10-CM

## 2021-02-13 DIAGNOSIS — E7800 Pure hypercholesterolemia, unspecified: Secondary | ICD-10-CM | POA: Diagnosis present

## 2021-02-13 DIAGNOSIS — Z853 Personal history of malignant neoplasm of breast: Secondary | ICD-10-CM

## 2021-02-13 DIAGNOSIS — L03113 Cellulitis of right upper limb: Principal | ICD-10-CM | POA: Diagnosis present

## 2021-02-13 DIAGNOSIS — E871 Hypo-osmolality and hyponatremia: Secondary | ICD-10-CM | POA: Diagnosis present

## 2021-02-13 DIAGNOSIS — E876 Hypokalemia: Secondary | ICD-10-CM | POA: Diagnosis present

## 2021-02-13 DIAGNOSIS — Z20822 Contact with and (suspected) exposure to covid-19: Secondary | ICD-10-CM | POA: Diagnosis not present

## 2021-02-13 DIAGNOSIS — I2510 Atherosclerotic heart disease of native coronary artery without angina pectoris: Secondary | ICD-10-CM | POA: Diagnosis present

## 2021-02-13 DIAGNOSIS — I10 Essential (primary) hypertension: Secondary | ICD-10-CM | POA: Diagnosis not present

## 2021-02-13 DIAGNOSIS — Z87891 Personal history of nicotine dependence: Secondary | ICD-10-CM

## 2021-02-13 DIAGNOSIS — F419 Anxiety disorder, unspecified: Secondary | ICD-10-CM | POA: Diagnosis present

## 2021-02-13 DIAGNOSIS — W5501XA Bitten by cat, initial encounter: Secondary | ICD-10-CM

## 2021-02-13 DIAGNOSIS — Z9221 Personal history of antineoplastic chemotherapy: Secondary | ICD-10-CM

## 2021-02-13 DIAGNOSIS — Z800 Family history of malignant neoplasm of digestive organs: Secondary | ICD-10-CM

## 2021-02-13 NOTE — ED Triage Notes (Signed)
C/o cat bite x 2 days ago , seen by UC yesterday and taking ABX today swelling and pain

## 2021-02-14 ENCOUNTER — Inpatient Hospital Stay (HOSPITAL_BASED_OUTPATIENT_CLINIC_OR_DEPARTMENT_OTHER)
Admission: EM | Admit: 2021-02-14 | Discharge: 2021-02-17 | DRG: 603 | Disposition: A | Discharge status: Home / Self Care | Payer: Medicare Other | Attending: Internal Medicine | Admitting: Internal Medicine

## 2021-02-14 DIAGNOSIS — Z7982 Long term (current) use of aspirin: Secondary | ICD-10-CM | POA: Diagnosis not present

## 2021-02-14 DIAGNOSIS — Z923 Personal history of irradiation: Secondary | ICD-10-CM | POA: Diagnosis not present

## 2021-02-14 DIAGNOSIS — Z87891 Personal history of nicotine dependence: Secondary | ICD-10-CM | POA: Diagnosis not present

## 2021-02-14 DIAGNOSIS — L03113 Cellulitis of right upper limb: Principal | ICD-10-CM

## 2021-02-14 DIAGNOSIS — E7800 Pure hypercholesterolemia, unspecified: Secondary | ICD-10-CM | POA: Diagnosis present

## 2021-02-14 DIAGNOSIS — W5501XA Bitten by cat, initial encounter: Secondary | ICD-10-CM | POA: Diagnosis present

## 2021-02-14 DIAGNOSIS — I25110 Atherosclerotic heart disease of native coronary artery with unstable angina pectoris: Secondary | ICD-10-CM | POA: Diagnosis present

## 2021-02-14 DIAGNOSIS — I25118 Atherosclerotic heart disease of native coronary artery with other forms of angina pectoris: Secondary | ICD-10-CM | POA: Diagnosis not present

## 2021-02-14 DIAGNOSIS — F419 Anxiety disorder, unspecified: Secondary | ICD-10-CM | POA: Diagnosis present

## 2021-02-14 DIAGNOSIS — E876 Hypokalemia: Secondary | ICD-10-CM | POA: Diagnosis present

## 2021-02-14 DIAGNOSIS — E871 Hypo-osmolality and hyponatremia: Secondary | ICD-10-CM | POA: Diagnosis present

## 2021-02-14 DIAGNOSIS — Z800 Family history of malignant neoplasm of digestive organs: Secondary | ICD-10-CM | POA: Diagnosis not present

## 2021-02-14 DIAGNOSIS — I2510 Atherosclerotic heart disease of native coronary artery without angina pectoris: Secondary | ICD-10-CM | POA: Diagnosis present

## 2021-02-14 DIAGNOSIS — Z9221 Personal history of antineoplastic chemotherapy: Secondary | ICD-10-CM | POA: Diagnosis not present

## 2021-02-14 DIAGNOSIS — L089 Local infection of the skin and subcutaneous tissue, unspecified: Secondary | ICD-10-CM | POA: Diagnosis not present

## 2021-02-14 DIAGNOSIS — I10 Essential (primary) hypertension: Secondary | ICD-10-CM

## 2021-02-14 DIAGNOSIS — S51851A Open bite of right forearm, initial encounter: Secondary | ICD-10-CM

## 2021-02-14 DIAGNOSIS — Z20822 Contact with and (suspected) exposure to covid-19: Secondary | ICD-10-CM | POA: Diagnosis present

## 2021-02-14 DIAGNOSIS — F32A Depression, unspecified: Secondary | ICD-10-CM | POA: Diagnosis present

## 2021-02-14 DIAGNOSIS — W5501XD Bitten by cat, subsequent encounter: Secondary | ICD-10-CM

## 2021-02-14 DIAGNOSIS — Z7902 Long term (current) use of antithrombotics/antiplatelets: Secondary | ICD-10-CM | POA: Diagnosis not present

## 2021-02-14 DIAGNOSIS — L0390 Cellulitis, unspecified: Secondary | ICD-10-CM | POA: Diagnosis present

## 2021-02-14 DIAGNOSIS — Z8249 Family history of ischemic heart disease and other diseases of the circulatory system: Secondary | ICD-10-CM | POA: Diagnosis not present

## 2021-02-14 DIAGNOSIS — Z853 Personal history of malignant neoplasm of breast: Secondary | ICD-10-CM | POA: Diagnosis not present

## 2021-02-14 DIAGNOSIS — Z79899 Other long term (current) drug therapy: Secondary | ICD-10-CM | POA: Diagnosis not present

## 2021-02-14 LAB — CBC WITH DIFFERENTIAL/PLATELET
Abs Immature Granulocytes: 0.06 10*3/uL (ref 0.00–0.07)
Basophils Absolute: 0 10*3/uL (ref 0.0–0.1)
Basophils Relative: 0 %
Eosinophils Absolute: 0 10*3/uL (ref 0.0–0.5)
Eosinophils Relative: 0 %
HCT: 40.2 % (ref 36.0–46.0)
Hemoglobin: 14 g/dL (ref 12.0–15.0)
Immature Granulocytes: 0 %
Lymphocytes Relative: 10 %
Lymphs Abs: 1.5 10*3/uL (ref 0.7–4.0)
MCH: 32.5 pg (ref 26.0–34.0)
MCHC: 34.8 g/dL (ref 30.0–36.0)
MCV: 93.3 fL (ref 80.0–100.0)
Monocytes Absolute: 1 10*3/uL (ref 0.1–1.0)
Monocytes Relative: 7 %
Neutro Abs: 13.1 10*3/uL — ABNORMAL HIGH (ref 1.7–7.7)
Neutrophils Relative %: 83 %
Platelets: 378 10*3/uL (ref 150–400)
RBC: 4.31 MIL/uL (ref 3.87–5.11)
RDW: 12.8 % (ref 11.5–15.5)
WBC: 15.7 10*3/uL — ABNORMAL HIGH (ref 4.0–10.5)
nRBC: 0 % (ref 0.0–0.2)

## 2021-02-14 LAB — BASIC METABOLIC PANEL
Anion gap: 14 (ref 5–15)
BUN: 13 mg/dL (ref 8–23)
CO2: 26 mmol/L (ref 22–32)
Calcium: 9.2 mg/dL (ref 8.9–10.3)
Chloride: 89 mmol/L — ABNORMAL LOW (ref 98–111)
Creatinine, Ser: 0.66 mg/dL (ref 0.44–1.00)
GFR, Estimated: >60 mL/min (ref >60)
Glucose, Bld: 108 mg/dL — ABNORMAL HIGH (ref 70–99)
Potassium: 3.4 mmol/L — ABNORMAL LOW (ref 3.5–5.1)
Sodium: 129 mmol/L — ABNORMAL LOW (ref 135–145)

## 2021-02-14 LAB — CREATININE, SERUM
Creatinine, Ser: 0.72 mg/dL (ref 0.44–1.00)
GFR, Estimated: >60 mL/min (ref >60)

## 2021-02-14 LAB — CBC
HCT: 39.3 % (ref 36.0–46.0)
Hemoglobin: 13.4 g/dL (ref 12.0–15.0)
MCH: 32.4 pg (ref 26.0–34.0)
MCHC: 34.1 g/dL (ref 30.0–36.0)
MCV: 95.2 fL (ref 80.0–100.0)
Platelets: 348 10*3/uL (ref 150–400)
RBC: 4.13 MIL/uL (ref 3.87–5.11)
RDW: 12.9 % (ref 11.5–15.5)
WBC: 12.6 10*3/uL — ABNORMAL HIGH (ref 4.0–10.5)
nRBC: 0 % (ref 0.0–0.2)

## 2021-02-14 LAB — RESP PANEL BY RT-PCR (FLU A&B, COVID) ARPGX2
Influenza A by PCR: NEGATIVE
Influenza B by PCR: NEGATIVE
SARS Coronavirus 2 by RT PCR: NEGATIVE

## 2021-02-14 LAB — HIV ANTIBODY (ROUTINE TESTING W REFLEX): HIV Screen 4th Generation wRfx: NONREACTIVE

## 2021-02-14 LAB — MAGNESIUM: Magnesium: 2.2 mg/dL (ref 1.7–2.4)

## 2021-02-14 MED ORDER — SODIUM CHLORIDE 0.9 % IV SOLN
3.0000 g | Freq: Four times a day (QID) | INTRAVENOUS | Status: DC
  Administered 2021-02-14 – 2021-02-17 (×12): 3 g via INTRAVENOUS
  Filled 2021-02-14 (×2): qty 8
  Filled 2021-02-14 (×2): qty 3
  Filled 2021-02-14: qty 8
  Filled 2021-02-14 (×7): qty 3
  Filled 2021-02-14: qty 8

## 2021-02-14 MED ORDER — NITROGLYCERIN 0.4 MG SL SUBL: 0.4000 mg | SUBLINGUAL_TABLET | SUBLINGUAL | Status: DC | PRN

## 2021-02-14 MED ORDER — CLOPIDOGREL BISULFATE 75 MG PO TABS
75.0000 mg | ORAL_TABLET | Freq: Every day | ORAL | Status: DC
  Administered 2021-02-15 – 2021-02-17 (×2): 75 mg via ORAL
  Filled 2021-02-14 (×3): qty 1

## 2021-02-14 MED ORDER — ONDANSETRON HCL 4 MG/2ML IJ SOLN: 4.0000 mg | Freq: Four times a day (QID) | INTRAMUSCULAR | Status: DC | PRN

## 2021-02-14 MED ORDER — SODIUM CHLORIDE 0.9 % IV SOLN
3.0000 g | Freq: Once | INTRAVENOUS | Status: AC
  Administered 2021-02-14: 3 g via INTRAVENOUS
  Filled 2021-02-14: qty 8

## 2021-02-14 MED ORDER — SODIUM CHLORIDE 0.9 % IV SOLN
3.0000 g | INTRAVENOUS | Status: AC
  Administered 2021-02-14: 3 g via INTRAVENOUS
  Filled 2021-02-14: qty 8

## 2021-02-14 MED ORDER — OXYCODONE-ACETAMINOPHEN 5-325 MG PO TABS
2.0000 | ORAL_TABLET | Freq: Once | ORAL | Status: AC
  Administered 2021-02-14: 2 via ORAL
  Filled 2021-02-14: qty 2

## 2021-02-14 MED ORDER — ENOXAPARIN SODIUM 40 MG/0.4ML IJ SOSY
40.0000 mg | PREFILLED_SYRINGE | INTRAMUSCULAR | Status: DC
  Administered 2021-02-14 – 2021-02-17 (×4): 40 mg via SUBCUTANEOUS
  Filled 2021-02-14 (×4): qty 0.4

## 2021-02-14 MED ORDER — ONDANSETRON HCL 4 MG/2ML IJ SOLN
4.0000 mg | Freq: Once | INTRAMUSCULAR | Status: AC
  Administered 2021-02-14: 4 mg via INTRAVENOUS
  Filled 2021-02-14: qty 2

## 2021-02-14 MED ORDER — OXYCODONE HCL 5 MG PO TABS
10.0000 mg | ORAL_TABLET | ORAL | Status: DC | PRN
  Administered 2021-02-14 – 2021-02-17 (×15): 10 mg via ORAL
  Filled 2021-02-14 (×15): qty 2

## 2021-02-14 MED ORDER — ONDANSETRON HCL 4 MG PO TABS: 4.0000 mg | ORAL_TABLET | Freq: Four times a day (QID) | ORAL | Status: DC | PRN

## 2021-02-14 MED ORDER — ASPIRIN 81 MG PO CHEW
81.0000 mg | CHEWABLE_TABLET | Freq: Every day | ORAL | Status: DC
  Administered 2021-02-15 – 2021-02-17 (×3): 81 mg via ORAL
  Filled 2021-02-14 (×3): qty 1

## 2021-02-14 MED ORDER — SODIUM CHLORIDE 0.9 % IV SOLN: INTRAVENOUS | Status: DC

## 2021-02-14 MED ORDER — HYDROMORPHONE HCL 1 MG/ML IJ SOLN: 1.0000 mg | INTRAMUSCULAR | Status: DC | PRN

## 2021-02-14 MED ORDER — CYCLOBENZAPRINE HCL 5 MG PO TABS: 5.0000 mg | ORAL_TABLET | Freq: Three times a day (TID) | ORAL | Status: DC | PRN

## 2021-02-14 MED ORDER — ALPRAZOLAM 1 MG PO TABS
1.0000 mg | ORAL_TABLET | Freq: Three times a day (TID) | ORAL | Status: DC | PRN
  Administered 2021-02-14 – 2021-02-17 (×9): 1 mg via ORAL
  Filled 2021-02-14 (×9): qty 1

## 2021-02-14 MED ORDER — LOSARTAN POTASSIUM 50 MG PO TABS
50.0000 mg | ORAL_TABLET | Freq: Every day | ORAL | Status: DC
  Administered 2021-02-14: 50 mg via ORAL
  Administered 2021-02-15: 25 mg via ORAL
  Filled 2021-02-14 (×2): qty 1

## 2021-02-14 MED ORDER — KETOROLAC TROMETHAMINE 15 MG/ML IJ SOLN
15.0000 mg | Freq: Once | INTRAMUSCULAR | Status: AC
  Administered 2021-02-14: 15 mg via INTRAVENOUS
  Filled 2021-02-14: qty 1

## 2021-02-14 MED ORDER — POTASSIUM CHLORIDE 20 MEQ PO PACK
40.0000 meq | PACK | Freq: Two times a day (BID) | ORAL | Status: AC
  Administered 2021-02-14 (×2): 40 meq via ORAL
  Filled 2021-02-14 (×2): qty 2

## 2021-02-14 MED ORDER — ATORVASTATIN CALCIUM 10 MG PO TABS
10.0000 mg | ORAL_TABLET | Freq: Every day | ORAL | Status: DC
  Filled 2021-02-14 (×3): qty 1

## 2021-02-14 NOTE — ED Provider Notes (Addendum)
[facility] Provider Note: Madison Spurling, MD, FACEP  CSN: [id number] MRN: [id number] ARRIVAL: 02/13/21 at 2344 ROOM: [place]  02/14/21 2:11 AM Madison West is a 65 y.o. female who was bitten on the right arm by her own cat at about 10 AM 2 days ago.  (The cat is up-to-date on its rabies immunizations.)  She was seen at an urgent care that afternoon and was started on amoxicillin.  She has a history of breast cancer on the right with lymph node dissection of the right axilla.  She has had cellulitis in that arm in the past.  Despite taking the amoxicillin her right forearm has become erythematous, painful and swollen.  She rates associated pain as an 8 out of 10, worse with palpation or movement.  She has had a low-grade fever, 100.3 on arrival.   Past Medical History:  Diagnosis Date   Anxiety    Arthritis    low back and hip pain intermittent   Breast cancer ([place]) 06/02/07   r breast -surgery ,radiaology. chemotherapy   Carpal tunnel syndrome    right hand   Colon polyps    Complication of anesthesia    Fentanyl, Versed-makes extra hyper, bradycardia x 1 in PACU, [facility] (08/15/11 cardiology felt neostigmine may have resulted in AV nodal block)    Coronary artery disease    Depression    denies   Dysplasia of vulva    Hypertension    Palpitations    PSVT, s/p adenosine 08/04/16   S/P breast lumpectomy 07/04/07   R breast   S/P radiation therapy 2009    Past Surgical History:  Procedure Laterality Date   APPENDECTOMY     age 66   CESAREAN SECTION     x 2   COLONOSCOPY W/ POLYPECTOMY     COLONOSCOPY WITH PROPOFOL N/A 04/27/2016   Procedure: COLONOSCOPY WITH PROPOFOL;  Surgeon: Garlan Fair, MD;  [facility];  Service: Endoscopy;  Laterality: N/A;   DILATION AND CURETTAGE OF UTERUS     multiple   ENDARTERECTOMY Right 06/09/2019   ENDARTERECTOMY Right 06/09/2019    Procedure: ENDARTERECTOMY CAROTID RIGHT;  Surgeon: Elam Dutch, MD;  Location: [place];  Service: Vascular;  Laterality: Right;   INTRAUTERINE DEVICE INSERTION     IUD REMOVAL     LEFT HEART CATH AND CORONARY ANGIOGRAPHY N/A 02/27/2020   Procedure: LEFT HEART CATH AND CORONARY ANGIOGRAPHY;  Surgeon: Adrian Prows, MD;  Location: [facility];  Service: Cardiovascular;  Laterality: N/A;   MASTECTOMY PARTIAL / LUMPECTOMY W/ AXILLARY LYMPHADENECTOMY Right    lumpectomy and lymph nodes removed   PATCH ANGIOPLASTY Right 06/09/2019   Procedure: Patch Angioplasty of right carotid artery using hemashield paltinum finesse patch;  Surgeon: Elam Dutch, MD;  [facility];  Service: Vascular;  Laterality: Right;   TUBAL LIGATION     VULVA SURGERY     Multiple times for dysplasia    Family History  Problem Relation Age of Onset   Colon cancer Mother 34   Heart disease Father        heart attack   Colon polyps Brother        one brother had large colon polyp that needed surgery to be removed   Heart disease Paternal Grandfather    Diabetes type II Brother     Social History   Tobacco Use  Smoking status: Former    Packs/day: 0.75    Years: 20.00    Pack years: 15.00    Types: Cigarettes    Quit date: 05/06/1997    Years since quitting: 23.7   Smokeless tobacco: Never  Vaping Use   Vaping Use: Never used  Substance Use Topics   Alcohol use: Yes    Alcohol/week: 0.0 standard drinks    Comment: occ   Drug use: No    Prior to Admission medications   Medication Sig Start Date End Date Taking? Authorizing Provider  alprazolam Duanne Moron) 2 MG tablet 1/2-1 tid prn anxiety. 04/19/20   [name], MD  aspirin 81 MG chewable tablet Chew 81 mg by mouth daily.     [provider]  atorvastatin (LIPITOR) 10 MG tablet Take 1 tablet (10 mg total) by mouth daily. 05/22/20 05/22/21  Adrian Prows, MD  clopidogrel (PLAVIX) 75 MG tablet Take 1 tablet (75 mg total) by mouth  daily. 08/30/20   Adrian Prows, MD  cyclobenzaprine (FLEXERIL) 5 MG tablet Take 1 tablet (5 mg total) by mouth 3 (three) times daily as needed for muscle spasms. 04/24/20   [name], MD  Garlic [id number] MG CAPS Take 500 mg by mouth in the morning and at bedtime.     [provider]  losartan-hydrochlorothiazide (HYZAAR) 50-12.5 MG tablet Take 0.5 tablets by mouth daily. 08/30/20   Adrian Prows, MD  nitroGLYCERIN (NITROSTAT) 0.4 MG SL tablet Place 0.4 mg under the tongue every 5 (five) minutes as needed for chest pain.    [provider]  Omega-3 Fatty Acids (FISH OIL) 500 MG CAPS Take 500 mg by mouth in the morning and at bedtime.     [provider]    Allergies Prednisone, Fentanyl, Midazolam, and Vicodin [hydrocodone-acetaminophen]   REVIEW OF SYSTEMS  Negative except as noted here or in the History of Present Illness.   PHYSICAL EXAMINATION  Initial Vital Signs Blood pressure 132/66, pulse 91, temperature 100.3 F (37.9 C), temperature source Oral, resp. rate 16, height '5\' 8"'$  (1.727 m), weight 88.5 kg, SpO2 96 %.  Examination General: Well-developed, well-nourished female in no acute distress; appearance consistent with age of record HENT: normocephalic; atraumatic Eyes: Normal appearance Neck: supple Heart: regular rate and rhythm Lungs: clear to auscultation bilaterally Abdomen: soft; nondistended; nontender; bowel sounds present Extremities: No deformity; erythema, swelling, tenderness and warmth of right forearm with wounds consistent with cat bites of the dorsal surface:    Neurologic: Awake, alert and oriented; motor function intact in all extremities and symmetric; no facial droop Skin: Warm and dry Psychiatric: Normal mood and affect   RESULTS  Summary of this visit's results, reviewed and interpreted by myself:   EKG Interpretation  Date/Time:    Ventricular Rate:    PR Interval:    QRS Duration:   QT Interval:    QTC Calculation:    R Axis:     Text Interpretation:         Laboratory Studies: Results for orders placed or performed during the hospital encounter of 02/14/21 (from the past 24 hour(s))  CBC with Differential/Platelet     Status: Abnormal   Collection Time: 02/14/21  3:04 AM  Result Value Ref Range   WBC 15.7 (H) 4.0 - 10.5 K/uL   RBC 4.31 3.87 - 5.11 MIL/uL   Hemoglobin 14.0 12.0 - 15.0 g/dL   HCT 40.2 36.0 - 46.0 %   MCV 93.3 80.0 - 100.0 fL  MCH 32.5 26.0 - 34.0 pg   MCHC 34.8 30.0 - 36.0 g/dL   RDW 12.8 11.5 - 15.5 %   Platelets 378 150 - 400 K/uL   nRBC 0.0 0.0 - 0.2 %   Neutrophils Relative % 83 %   Neutro Abs 13.1 (H) 1.7 - 7.7 K/uL   Lymphocytes Relative 10 %   Lymphs Abs 1.5 0.7 - 4.0 K/uL   Monocytes Relative 7 %   Monocytes Absolute 1.0 0.1 - 1.0 K/uL   Eosinophils Relative 0 %   Eosinophils Absolute 0.0 0.0 - 0.5 K/uL   Basophils Relative 0 %   Basophils Absolute 0.0 0.0 - 0.1 K/uL   Immature Granulocytes 0 %   Abs Immature Granulocytes 0.06 0.00 - 0.07 K/uL  Basic metabolic panel     Status: Abnormal   Collection Time: 02/14/21  3:04 AM  Result Value Ref Range   Sodium 129 (L) 135 - 145 mmol/L   Potassium 3.4 (L) 3.5 - 5.1 mmol/L   Chloride 89 (L) 98 - 111 mmol/L   CO2 26 22 - 32 mmol/L   Glucose, Bld 108 (H) 70 - 99 mg/dL   BUN 13 8 - 23 mg/dL   Creatinine, Ser 0.66 0.44 - 1.00 mg/dL   Calcium 9.2 8.9 - 10.3 mg/dL   GFR, Estimated >60 >60 mL/min   Anion gap 14 5 - 15   Imaging Studies: No results found.  ED COURSE and MDM  Nursing notes, initial and subsequent vitals signs, including pulse oximetry, reviewed and interpreted by myself.  Vitals:   02/14/21 0054 02/14/21 0055 02/14/21 0200 02/14/21 0304  BP: (!) 154/78  132/66 (!) 148/86  Pulse: 94  91 95  Resp: '20  16 16  '$ Temp: 100.3 F (37.9 C)     TempSrc: Oral     SpO2: 98%  96% 97%  Weight:  88.5 kg    Height:       Medications  Ampicillin-Sulbactam (UNASYN) 3 g in sodium chloride 0.9 % 100 mL IVPB  (0 g Intravenous Stopped 02/14/21 0339)  ondansetron (ZOFRAN) injection 4 mg (4 mg Intravenous Given 02/14/21 0259)  ketorolac (TORADOL) 15 MG/ML injection 15 mg (15 mg Intravenous Given 02/14/21 0259)   2:31 AM Unasyn 3 g started for cellulitis status post cat bite in the context of likely lymphatic insufficiency.  3:51 AM Will have patient admitted to the hospitalist service.  Discussed with Dr. Myna Hidalgo.   PROCEDURES  Procedures   ED DIAGNOSES     ICD-10-CM   1. Cat bite of right forearm with infection, initial encounter  S51.851A    L08.9    W55.[place], MD 02/14/21 [id number]    Shanon Rosser, MD 02/14/21 [phone]

## 2021-02-14 NOTE — ED Notes (Signed)
Patient ambulated well to BR. Patient provided with Peanut Butter Crackers and Ice Water upon Return. Also provided with Warm Blankets. Patient comfortable at this time. Patient informed on Current Admission Status and informed that Patient will be updated on Bed Updates. Call [place] within [place]. Staff will continue to Monitor.

## 2021-02-14 NOTE — Progress Notes (Signed)
Pharmacy Antibiotic Note  Madison West is a 65 y.o. female admitted on 02/14/2021 with cellulitis of right arm secondary to cat bite. Pharmacy has been consulted for Unasyn dosing.  Plan: Unasyn 3g IV q6h  Need for dosage adjustment appears unlikely at present, so pharmacy will sign off at this time. Please reconsult if a change in clinical status warrants re-evaluation of dosage.    Height: '5\' 8"'$  (172.7 cm) Weight: 88.5 kg (195 lb 1.7 oz) IBW/kg (Calculated) : 63.9  Temp (24hrs), Avg:99.1 F (37.3 C), Min:98.2 F (36.8 C), Max:100.3 F (37.9 C)  Recent Labs  Lab 02/14/21 0304 02/14/21 1107  WBC 15.7* 12.6*  CREATININE 0.66 0.72    Estimated Creatinine Clearance: 79.4 mL/min (by C-G formula based on SCr of 0.72 mg/dL).    Allergies  Allergen Reactions   Prednisone Palpitations   Fentanyl Other (See Comments)    MAKES SUPER HYPER PER PT   Midazolam Anxiety   Vicodin [Hydrocodone-Acetaminophen] Anxiety    Extreme anxiety.    Antimicrobials this admission: 8/26 Unasyn >>  Dose adjustments this admission: --  Microbiology results: 8/26 BCx: sent 8/26 Resp panel: COVID negative, Influenza A/B negative   Thank you for allowing pharmacy to be a part of this patient's care.   Lindell Spar, PharmD, BCPS Clinical Pharmacist 02/14/2021 2:06 PM

## 2021-02-14 NOTE — ED Notes (Signed)
Phone Handoff Report provided to [employer] - spoke with Drinda Butts, RN

## 2021-02-14 NOTE — ED Notes (Signed)
Patient provided with Ice Water at this Time. Patient comfortable at this time.

## 2021-02-14 NOTE — H&P (Signed)
History and Physical    Madison West [id number] DOB: October 18, 1955 DOA: 02/14/2021  PCP: [name], MD  Patient coming from: [facility]  I have personally briefly reviewed patient's old medical records in [place]  Chief Complaint: Cat bite on right arm  HPI: Madison West is a 65 y.o. female with medical history significant of hypertension, hyperlipidemia, anxiety, right breast cancer status post chemo and radiation therapy,, chronic back pain presents to the emergency department with cat bite on her right arm 2 days ago she was prescribed amoxicillin which she was taking it as prescribed (took total 3 doses ) however her right forearm has become progressively erythematous, painful and swollen.  She had severe pain 8 out of 10, worse with the movements, she had a low-grade fever of 100.3 therefore she went to the med [facility] for further evaluation and management.  Off note; patient's cat is fully vaccinated against rabies.  ED Course: Upon arrival to ED: Patient temperature 100.3, pulse 94, respiratory rate 20, blood pressure 154/78, maintaining oxygen saturation on room air.  WBC 15.7, sodium 129, potassium 3.4.  Patient was given Unasyn, Toradol, Zofran, oxycodone and transferred to [facility] for further evaluation and management of right arm cellulitis due to cat bite.    Review of Systems: As per HPI otherwise negative.    Past Medical History:  Diagnosis Date   Anxiety    Arthritis    low back and hip pain intermittent   Breast cancer ([place]) 06/02/07   r breast -surgery ,radiaology. chemotherapy   Carpal tunnel syndrome    right hand   Colon polyps    Complication of anesthesia    Fentanyl, Versed-makes extra hyper, bradycardia x 1 in PACU, [facility] (08/15/11 cardiology felt neostigmine may have resulted in AV nodal block)    Coronary artery disease    Depression    denies   Dysplasia of vulva    Hypertension    Palpitations    PSVT, s/p  adenosine 08/04/16   S/P breast lumpectomy 07/04/07   R breast   S/P radiation therapy 2009    Past Surgical History:  Procedure Laterality Date   APPENDECTOMY     age 46   CESAREAN SECTION     x 2   COLONOSCOPY W/ POLYPECTOMY     COLONOSCOPY WITH PROPOFOL N/A 04/27/2016   Procedure: COLONOSCOPY WITH PROPOFOL;  Surgeon: Garlan Fair, MD;  Location: [facility];  Service: Endoscopy;  Laterality: N/A;   DILATION AND CURETTAGE OF UTERUS     multiple   ENDARTERECTOMY Right 06/09/2019   ENDARTERECTOMY Right 06/09/2019   Procedure: ENDARTERECTOMY CAROTID RIGHT;  Surgeon: Elam Dutch, MD;  Location: [place];  Service: Vascular;  Laterality: Right;   INTRAUTERINE DEVICE INSERTION     IUD REMOVAL     LEFT HEART CATH AND CORONARY ANGIOGRAPHY N/A 02/27/2020   Procedure: LEFT HEART CATH AND CORONARY ANGIOGRAPHY;  Surgeon: Adrian Prows, MD;  Location: [place] CV LAB;  Service: Cardiovascular;  Laterality: N/A;   MASTECTOMY PARTIAL / LUMPECTOMY W/ AXILLARY LYMPHADENECTOMY Right    lumpectomy and lymph nodes removed   PATCH ANGIOPLASTY Right 06/09/2019   Procedure: Patch Angioplasty of right carotid artery using hemashield paltinum finesse patch;  Surgeon: Elam Dutch, MD;  Location: [facility] OR;  Service: Vascular;  Laterality: Right;   TUBAL LIGATION     VULVA SURGERY     Multiple times for dysplasia     reports that  she quit smoking about 23 years ago. Her smoking use included cigarettes. She has a 15.00 pack-year smoking history. She has never used smokeless tobacco. She reports current alcohol use. She reports that she does not use drugs.  Allergies  Allergen Reactions   Prednisone Palpitations   Fentanyl Other (See Comments)    MAKES SUPER HYPER PER PT   Midazolam Anxiety   Vicodin [Hydrocodone-Acetaminophen] Anxiety    Extreme anxiety.    Family History  Problem Relation Age of Onset   Colon cancer Mother 10   Heart disease Father        heart attack   Colon polyps  Brother        one brother had large colon polyp that needed surgery to be removed   Heart disease Paternal Grandfather    Diabetes type II Brother     Prior to Admission medications   Medication Sig Start Date End Date Taking? Authorizing Provider  alprazolam Duanne Moron) 2 MG tablet 1/2-1 tid prn anxiety. 04/19/20   [name], MD  aspirin 81 MG chewable tablet Chew 81 mg by mouth daily.     [provider]  atorvastatin (LIPITOR) 10 MG tablet Take 1 tablet (10 mg total) by mouth daily. 05/22/20 05/22/21  Adrian Prows, MD  clopidogrel (PLAVIX) 75 MG tablet Take 1 tablet (75 mg total) by mouth daily. 08/30/20   Adrian Prows, MD  cyclobenzaprine (FLEXERIL) 5 MG tablet Take 1 tablet (5 mg total) by mouth 3 (three) times daily as needed for muscle spasms. 04/24/20   [name], MD  Garlic [id number] MG CAPS Take 500 mg by mouth in the morning and at bedtime.     [provider]  losartan-hydrochlorothiazide (HYZAAR) 50-12.5 MG tablet Take 0.5 tablets by mouth daily. 08/30/20   Adrian Prows, MD  nitroGLYCERIN (NITROSTAT) 0.4 MG SL tablet Place 0.4 mg under the tongue every 5 (five) minutes as needed for chest pain.    [provider]  Omega-3 Fatty Acids (FISH OIL) 500 MG CAPS Take 500 mg by mouth in the morning and at bedtime.     [provider]    Physical Exam: Vitals:   02/14/21 0600 02/14/21 0700 02/14/21 0900 02/14/21 1013  BP: 102/90 (!) 110/98 101/65 132/71  Pulse: 80 77 67 72  Resp: 16   19  Temp:    98.2 F (36.8 C)  TempSrc:    Oral  SpO2: 95% (!) 89% 94% 98%  Weight:      Height:        Constitutional: NAD, calm, comfortable Eyes: PERRL, lids and conjunctivae normal ENMT: Mucous membranes are moist. Posterior pharynx clear of any exudate or lesions.Normal dentition.  Neck: normal, supple, no masses, no thyromegaly Respiratory: clear to auscultation bilaterally, no wheezing, no crackles. Normal respiratory effort. No accessory muscle use.   Cardiovascular: Regular rate and rhythm, no murmurs / rubs / gallops. No extremity edema. 2+ pedal pulses. No carotid bruits.  Abdomen: no tenderness, no masses palpated. No hepatosplenomegaly. Bowel sounds positive.  Musculoskeletal:   Neurologic: CN 2-12 grossly intact. Sensation intact, DTR normal. Strength 5/5 in all 4.  Psychiatric: Normal judgment and insight. Alert and oriented x 3. Normal mood.    Labs on Admission: I have personally reviewed following labs and imaging studies  CBC: Recent Labs  Lab 02/14/21 0304  WBC 15.7*  NEUTROABS 13.1*  HGB 14.0  HCT 40.2  MCV 93.3  PLT [id number]   Basic Metabolic Panel: Recent Labs  Lab 02/14/21 0304  NA 129*  K 3.4*  CL 89*  CO2 26  GLUCOSE 108*  BUN 13  CREATININE 0.66  CALCIUM 9.2   GFR: Estimated Creatinine Clearance: 79.4 mL/min (by C-G formula based on SCr of 0.66 mg/dL). Liver Function Tests: No results for input(s): AST, ALT, ALKPHOS, BILITOT, PROT, ALBUMIN in the last 168 hours. No results for input(s): LIPASE, AMYLASE in the last 168 hours. No results for input(s): AMMONIA in the last 168 hours. Coagulation Profile: No results for input(s): INR, PROTIME in the last 168 hours. Cardiac Enzymes: No results for input(s): CKTOTAL, CKMB, CKMBINDEX, TROPONINI in the last 168 hours. BNP (last 3 results) No results for input(s): PROBNP in the last 8760 hours. HbA1C: No results for input(s): HGBA1C in the last 72 hours. CBG: No results for input(s): GLUCAP in the last 168 hours. Lipid Profile: No results for input(s): CHOL, HDL, LDLCALC, TRIG, CHOLHDL, LDLDIRECT in the last 72 hours. Thyroid Function Tests: No results for input(s): TSH, T4TOTAL, FREET4, T3FREE, THYROIDAB in the last 72 hours. Anemia Panel: No results for input(s): VITAMINB12, FOLATE, FERRITIN, TIBC, IRON, RETICCTPCT in the last 72 hours. Urine analysis:    Component Value Date/Time   COLORURINE STRAW (A) 06/06/2019 1159   APPEARANCEUR CLEAR  06/06/2019 1159   LABSPEC 1.005 06/06/2019 1159   PHURINE 7.0 06/06/2019 1159   GLUCOSEU NEGATIVE 06/06/2019 1159   HGBUR SMALL (A) 06/06/2019 1159   BILIRUBINUR NEGATIVE 06/06/2019 1159   BILIRUBINUR neg 10/04/2013 2039   KETONESUR NEGATIVE 06/06/2019 1159   PROTEINUR NEGATIVE 06/06/2019 1159   UROBILINOGEN 0.2 10/04/2013 2039   NITRITE NEGATIVE 06/06/2019 1159   LEUKOCYTESUR NEGATIVE 06/06/2019 1159    Radiological Exams on Admission: No results found.   Assessment/Plan Principal Problem:   Cellulitis Active Problems:   Hypertension   Depression   Coronary artery disease of native artery of native heart with stable angina pectoris (HCC)   Pure hypercholesterolemia   Infected cat bite   Hyponatremia   Hypokalemia    Cellulitis of right arm in the setting of cat bite: -Failed outpatient antibiotic therapy.  Presented with low-grade fever and leukocytosis. -Received Unasyn in the ED -Admit patient on the floor.  Continue Unasyn. -Blood culture pending. -Gentle hydration, as needed pain medications  Hypertension: Blood pressure is stable -Hold HCTZ at this time due to hyponatremia.  Continue losartan. -Monitor blood pressure closely.    Hyperlipidemia: Continue statin  Hyponatremia: In the setting of HCTZ use -Hold HCTZ at this time. -Monitor BMP  Hypokalemia: Replenished.  Check magnesium level.  Repeat BMP tomorrow a.m.  History of right breast cancer: -S/p lumpectomy, radiation therapy and chemotherapy in 2009 and right axillary lymph node removal -History of recurrent breast cellulitis in the past  Coronary artery disease: -S/p left heart cath in 9/21 -Continue aspirin, statin, Plavix  Asymptomatic right carotid artery stenosis status post right carotid endarterectomy in 2020 -Continue aspirin, statin.  Anxiety: Continue as needed Xanax  DVT prophylaxis: Lovenox Code Status: Full code Family Communication: None present at bedside.  Plan of care  discussed with patient in length and he verbalized understanding and agreed with it. Disposition Plan: Likely home Consults called: None Admission status: Inpatient   Mckinley Jewel MD [facility]  If 7PM-7AM, please contact night-coverage [email or web address]  02/14/2021, 10:38 AM

## 2021-02-14 NOTE — ED Notes (Signed)
Blood Cultures collected prior to Antibiotic Administration.

## 2021-02-14 NOTE — ED Notes (Signed)
Patient Right Arm Wound loosely wrapped for Patient Comfort. Call [place] within [place]. Staff will continue to Monitor.

## 2021-02-15 DIAGNOSIS — S51851A Open bite of right forearm, initial encounter: Secondary | ICD-10-CM

## 2021-02-15 DIAGNOSIS — L089 Local infection of the skin and subcutaneous tissue, unspecified: Secondary | ICD-10-CM

## 2021-02-15 DIAGNOSIS — W5501XA Bitten by cat, initial encounter: Secondary | ICD-10-CM

## 2021-02-15 LAB — BASIC METABOLIC PANEL
Anion gap: 8 (ref 5–15)
BUN: 10 mg/dL (ref 8–23)
CO2: 27 mmol/L (ref 22–32)
Calcium: 8.5 mg/dL — ABNORMAL LOW (ref 8.9–10.3)
Chloride: 102 mmol/L (ref 98–111)
Creatinine, Ser: 0.56 mg/dL (ref 0.44–1.00)
GFR, Estimated: >60 mL/min (ref >60)
Glucose, Bld: 129 mg/dL — ABNORMAL HIGH (ref 70–99)
Potassium: 4.1 mmol/L (ref 3.5–5.1)
Sodium: 137 mmol/L (ref 135–145)

## 2021-02-15 LAB — CBC
HCT: 36.4 % (ref 36.0–46.0)
Hemoglobin: 12.3 g/dL (ref 12.0–15.0)
MCH: 32.2 pg (ref 26.0–34.0)
MCHC: 33.8 g/dL (ref 30.0–36.0)
MCV: 95.3 fL (ref 80.0–100.0)
Platelets: 323 10*3/uL (ref 150–400)
RBC: 3.82 MIL/uL — ABNORMAL LOW (ref 3.87–5.11)
RDW: 13 % (ref 11.5–15.5)
WBC: 9 10*3/uL (ref 4.0–10.5)
nRBC: 0 % (ref 0.0–0.2)

## 2021-02-15 MED ORDER — ALUM & MAG HYDROXIDE-SIMETH 200-200-20 MG/5ML PO SUSP
30.0000 mL | ORAL | Status: DC | PRN
  Administered 2021-02-15: 30 mL via ORAL
  Filled 2021-02-15: qty 30

## 2021-02-15 MED ORDER — LOSARTAN POTASSIUM 25 MG PO TABS
25.0000 mg | ORAL_TABLET | Freq: Every day | ORAL | Status: DC
  Administered 2021-02-16 – 2021-02-17 (×2): 25 mg via ORAL
  Filled 2021-02-15 (×2): qty 1

## 2021-02-15 NOTE — Progress Notes (Signed)
PROGRESS NOTE    Madison West  [id number] DOB: Aug 30, 1955 DOA: 02/14/2021 PCP: Fay Records, MD    Brief Narrative:  65 y.o. female with medical history significant of hypertension, hyperlipidemia, anxiety, right breast cancer status post chemo and radiation therapy,, chronic back pain presents to the emergency department with cat bite on her right arm 2 days ago she was prescribed amoxicillin which she was taking it as prescribed (took total 3 doses ) however her right forearm has become progressively erythematous, painful and swollen.  She had severe pain 8 out of 10, worse with the movements, she had a low-grade fever of 100.3 therefore she went to the med [facility] for further evaluation and management  Assessment & Plan:   Principal Problem:   Cellulitis Active Problems:   Hypertension   Depression   Coronary artery disease of native artery of native heart with stable angina pectoris (HCC)   Pure hypercholesterolemia   Infected cat bite   Hyponatremia   Hypokalemia   Cellulitis of right arm in the setting of cat bite: -Failed outpatient antibiotic therapy.  Presented with low-grade fever and leukocytosis. -Received Unasyn in the ED, continued for now -Blood culture pending. -Swelling around hand has improved, WBC improved. Swelling around elbow remains and pt still reports pain in region -Would cont current abx for now   Hypertension: Blood pressure is stable -Hold HCTZ at this time due to hyponatremia below.  Continue losartan. -Monitor blood pressure closely.     Hyperlipidemia: Continue statin   Hyponatremia: In the setting of HCTZ use -HCTZ on hold  -sodium normalized   Hypokalemia: Replenished.  Check magnesium level.  Repeat BMP tomorrow a.m.   History of right breast cancer: -S/p lumpectomy, radiation therapy and chemotherapy in 2009 and right axillary lymph node removal -History of recurrent breast cellulitis in the past -Seems stable at  this time   Coronary artery disease: -S/p left heart cath in 9/21 -Continue aspirin, statin, Plavix   Asymptomatic right carotid artery stenosis status post right carotid endarterectomy in 2020 -Continue aspirin, statin.   Anxiety: Continue as needed Xanax  DVT prophylaxis: Lovenox subq Code Status: Full Family Communication: Pt in room, family not at bedside  Status is: Inpatient  Remains inpatient appropriate because:Inpatient level of care appropriate due to severity of illness  Dispo: The patient is from: Home              Anticipated d/c is to: Home              Patient currently is not medically stable to d/c.   Difficult to place patient No       Consultants:    Procedures:    Antimicrobials: Anti-infectives (From admission, onward)    Start     Dose/Rate Route Frequency Ordered Stop   02/14/21 2000  Ampicillin-Sulbactam (UNASYN) 3 g in sodium chloride 0.9 % 100 mL IVPB        3 g 200 mL/hr over 30 Minutes Intravenous Every 6 hours 02/14/21 1405     02/14/21 1430  Ampicillin-Sulbactam (UNASYN) 3 g in sodium chloride 0.9 % 100 mL IVPB        3 g 200 mL/hr over 30 Minutes Intravenous STAT 02/14/21 1404 02/14/21 1534   02/14/21 0245  Ampicillin-Sulbactam (UNASYN) 3 g in sodium chloride 0.9 % 100 mL IVPB        3 g 200 mL/hr over 30 Minutes Intravenous  Once 02/14/21 0231 02/14/21 0339  Subjective: Reports swelling around hand is improved, but remains swollen at elbow  Objective: Vitals:   02/14/21 2042 02/15/21 0200 02/15/21 0551 02/15/21 0938  BP: 117/81  116/67 119/66  Pulse: 79  81 91  Resp: '16  18 18  '$ Temp: (!) 97.2 F (36.2 C)  98.4 F (36.9 C) 98.5 F (36.9 C)  TempSrc: Axillary  Oral Oral  SpO2: 97%  97% 93%  Weight:  94.4 kg    Height:        Intake/Output Summary (Last 24 hours) at 02/15/2021 1311 Last data filed at 02/15/2021 [id number] Gross per 24 hour  Intake 3418.49 ml  Output --  Net 3418.49 ml   Filed Weights   02/13/21  2346 02/14/21 0055 02/15/21 0200  Weight: 88.5 kg 88.5 kg 94.4 kg    Examination: General exam: Awake, laying in bed, in nad Respiratory system: Normal respiratory effort, no wheezing Cardiovascular system: regular rate, s1, s2 Gastrointestinal system: Soft, nondistended, positive BS Central nervous system: CN2-12 grossly intact, strength intact Extremities: Perfused, no clubbing, RUE edematous Skin: Normal skin turgor, no notable skin lesions seen Psychiatry: Mood normal // no visual hallucinations   Data Reviewed: I have personally reviewed following labs and imaging studies  CBC: Recent Labs  Lab 02/14/21 0304 02/14/21 1107 02/15/21 0329  WBC 15.7* 12.6* 9.0  NEUTROABS 13.1*  --   --   HGB 14.0 13.4 12.3  HCT 40.2 39.3 36.4  MCV 93.3 95.2 95.3  PLT 378 348 [id number]   Basic Metabolic Panel: Recent Labs  Lab 02/14/21 0304 02/14/21 1107 02/14/21 1212 02/15/21 0329  NA 129*  --   --  137  K 3.4*  --   --  4.1  CL 89*  --   --  102  CO2 26  --   --  27  GLUCOSE 108*  --   --  129*  BUN 13  --   --  10  CREATININE 0.66 0.72  --  0.56  CALCIUM 9.2  --   --  8.5*  MG  --   --  2.2  --    GFR: Estimated Creatinine Clearance: 82 mL/min (by C-G formula based on SCr of 0.56 mg/dL). Liver Function Tests: No results for input(s): AST, ALT, ALKPHOS, BILITOT, PROT, ALBUMIN in the last 168 hours. No results for input(s): LIPASE, AMYLASE in the last 168 hours. No results for input(s): AMMONIA in the last 168 hours. Coagulation Profile: No results for input(s): INR, PROTIME in the last 168 hours. Cardiac Enzymes: No results for input(s): CKTOTAL, CKMB, CKMBINDEX, TROPONINI in the last 168 hours. BNP (last 3 results) No results for input(s): PROBNP in the last 8760 hours. HbA1C: No results for input(s): HGBA1C in the last 72 hours. CBG: No results for input(s): GLUCAP in the last 168 hours. Lipid Profile: No results for input(s): CHOL, HDL, LDLCALC, TRIG, CHOLHDL, LDLDIRECT  in the last 72 hours. Thyroid Function Tests: No results for input(s): TSH, T4TOTAL, FREET4, T3FREE, THYROIDAB in the last 72 hours. Anemia Panel: No results for input(s): VITAMINB12, FOLATE, FERRITIN, TIBC, IRON, RETICCTPCT in the last 72 hours. Sepsis Labs: No results for input(s): PROCALCITON, LATICACIDVEN in the last 168 hours.  Recent Results (from the past 240 hour(s))  Blood culture (routine x 2)     Status: None (Preliminary result)   Collection Time: 02/14/21  3:00 AM   Specimen: Left Antecubital; Blood  Result Value Ref Range Status   Specimen Description   Final  LEFT ANTECUBITAL Performed at [facility], [address] [place], [place] [address]    Special Requests   Final    BOTTLES DRAWN AEROBIC AND ANAEROBIC Blood Culture results may not be optimal due to an inadequate volume of blood received in culture bottles Performed at [facility], [address] [place], [place] [address]    Culture   Final    NO GROWTH 1 DAY Performed at [facility], [place] [address], [place], [place] [address]    Report Status PENDING  Incomplete  Blood culture (routine x 2)     Status: None (Preliminary result)   Collection Time: 02/14/21  3:01 AM   Specimen: BLOOD LEFT HAND  Result Value Ref Range Status   Specimen Description   Final    BLOOD LEFT HAND Performed at [facility], [address] [place], [place] [address]    Special Requests   Final    BOTTLES DRAWN AEROBIC AND ANAEROBIC Blood Culture results may not be optimal due to an inadequate volume of blood received in culture bottles Performed at [facility], [address] [place], [place] [address]    Culture   Final    NO GROWTH 1 DAY Performed at [facility], [place] [address], [place], [place] [address]    Report Status PENDING  Incomplete  Resp Panel by RT-PCR (Flu A&B, Covid) Nasopharyngeal Swab     Status: None   Collection Time: 02/14/21  3:28 AM    Specimen: Nasopharyngeal Swab; Nasopharyngeal(NP) swabs in vial transport medium  Result Value Ref Range Status   SARS Coronavirus 2 by RT PCR NEGATIVE NEGATIVE Final    Comment: (NOTE) SARS-CoV-2 target nucleic acids are NOT DETECTED.  The SARS-CoV-2 RNA is generally detectable in upper respiratory specimens during the acute phase of infection. The lowest concentration of SARS-CoV-2 viral copies this assay can detect is 138 copies/mL. A negative result does not preclude SARS-Cov-2 infection and should not be used as the sole basis for treatment or other patient management decisions. A negative result may occur with  improper specimen collection/handling, submission of specimen other than nasopharyngeal swab, presence of viral mutation(s) within the areas targeted by this assay, and inadequate number of viral copies(<138 copies/mL). A negative result must be combined with clinical observations, patient history, and epidemiological information. The expected result is Negative.  Fact Sheet for Patients:  EntrepreneurPulse.com.au  Fact Sheet for Healthcare Providers:  IncredibleEmployment.be  This test is no t yet approved or cleared by the [place] FDA and  has been authorized for detection and/or diagnosis of SARS-CoV-2 by FDA under an Emergency Use Authorization (EUA). This EUA will remain  in effect (meaning this test can be used) for the duration of the COVID-19 declaration under Section 564(b)(1) of the Act, 21 U.S.C.section 360bbb-3(b)(1), unless the authorization is terminated  or revoked sooner.       Influenza A by PCR NEGATIVE NEGATIVE Final   Influenza B by PCR NEGATIVE NEGATIVE Final    Comment: (NOTE) The Xpert Xpress SARS-CoV-2/FLU/RSV plus assay is intended as an aid in the diagnosis of influenza from Nasopharyngeal swab specimens and should not be used as a sole basis for treatment. Nasal washings and aspirates are  unacceptable for Xpert Xpress SARS-CoV-2/FLU/RSV testing.  Fact Sheet for Patients: EntrepreneurPulse.com.au  Fact Sheet for Healthcare Providers: IncredibleEmployment.be  This test is not yet approved or cleared by the [place]  FDA and has been authorized for detection and/or diagnosis of SARS-CoV-2 by FDA under an Emergency Use Authorization (EUA). This EUA will remain in effect (meaning this test can be used) for the duration of the COVID-19 declaration under Section 564(b)(1) of the Act, 21 U.S.C. section 360bbb-3(b)(1), unless the authorization is terminated or revoked.  Performed at [facility], [address], [place], [place] [address]      Radiology Studies: No results found.  Scheduled Meds:  aspirin  81 mg Oral Daily   atorvastatin  10 mg Oral Daily   clopidogrel  75 mg Oral Daily   enoxaparin (LOVENOX) injection  40 mg Subcutaneous Q24H   [START ON 02/16/2021] losartan  25 mg Oral Daily   Continuous Infusions:  sodium chloride 75 mL/hr at 02/15/21 0948   ampicillin-sulbactam (UNASYN) IV Stopped (02/15/21 0804)     LOS: 1 day   Marylu Lund, MD [facility] Pager On Amion  If 7PM-7AM, please contact night-coverage 02/15/2021, 1:11 PM

## 2021-02-15 NOTE — Plan of Care (Signed)

## 2021-02-16 LAB — COMPREHENSIVE METABOLIC PANEL
ALT: 18 U/L (ref 0–44)
AST: 15 U/L (ref 15–41)
Albumin: 3.5 g/dL (ref 3.5–5.0)
Alkaline Phosphatase: 40 U/L (ref 38–126)
Anion gap: 8 (ref 5–15)
BUN: 14 mg/dL (ref 8–23)
CO2: 30 mmol/L (ref 22–32)
Calcium: 8.9 mg/dL (ref 8.9–10.3)
Chloride: 101 mmol/L (ref 98–111)
Creatinine, Ser: 0.6 mg/dL (ref 0.44–1.00)
GFR, Estimated: >60 mL/min (ref >60)
Glucose, Bld: 112 mg/dL — ABNORMAL HIGH (ref 70–99)
Potassium: 4.4 mmol/L (ref 3.5–5.1)
Sodium: 139 mmol/L (ref 135–145)
Total Bilirubin: 0.5 mg/dL (ref 0.3–1.2)
Total Protein: 6.4 g/dL — ABNORMAL LOW (ref 6.5–8.1)

## 2021-02-16 LAB — CBC
HCT: 35.5 % — ABNORMAL LOW (ref 36.0–46.0)
Hemoglobin: 11.9 g/dL — ABNORMAL LOW (ref 12.0–15.0)
MCH: 32.2 pg (ref 26.0–34.0)
MCHC: 33.5 g/dL (ref 30.0–36.0)
MCV: 95.9 fL (ref 80.0–100.0)
Platelets: 363 10*3/uL (ref 150–400)
RBC: 3.7 MIL/uL — ABNORMAL LOW (ref 3.87–5.11)
RDW: 12.7 % (ref 11.5–15.5)
WBC: 6.8 10*3/uL (ref 4.0–10.5)
nRBC: 0 % (ref 0.0–0.2)

## 2021-02-16 MED ORDER — BACITRACIN-NEOMYCIN-POLYMYXIN OINTMENT TUBE
TOPICAL_OINTMENT | Freq: Every day | CUTANEOUS | Status: DC
  Filled 2021-02-16: qty 14.17

## 2021-02-16 NOTE — Progress Notes (Signed)
Right arm swelling measurements  Measured at the purple circle on her arm: 12 inches  Measuring further up arm at the most swollen point: 14 inches

## 2021-02-16 NOTE — Progress Notes (Addendum)
PROGRESS NOTE    Madison West  [id number] DOB: Sep 15, 1955 DOA: 02/14/2021 PCP: Fay Records, MD    Brief Narrative:  65 y.o. female with medical history significant of hypertension, hyperlipidemia, anxiety, right breast cancer status post chemo and radiation therapy,, chronic back pain presents to the emergency department with cat bite on her right arm 2 days ago she was prescribed amoxicillin which she was taking it as prescribed (took total 3 doses ) however her right forearm has become progressively erythematous, painful and swollen.  She had severe pain 8 out of 10, worse with the movements, she had a low-grade fever of 100.3 therefore she went to the med [facility] for further evaluation and management  Assessment & Plan:   Principal Problem:   Cellulitis Active Problems:   Hypertension   Depression   Coronary artery disease of native artery of native heart with stable angina pectoris (HCC)   Pure hypercholesterolemia   Infected cat bite   Hyponatremia   Hypokalemia   Cellulitis of right arm in the setting of cat bite: -Failed outpatient antibiotic therapy.  Presented with low-grade fever and leukocytosis. -Received Unasyn in the ED, continued for now -Blood culture negative x2 days -Swelling around hand and elbow has much improved although patient claims swelling has worsened -Leukocytosis has normalized, patient is afebrile -Would cont current abx for now.  Measure arm diameter, if swelling is in fact worse, may consider follow-up imaging, see nursing documentation for measurements   Hypertension: Blood pressure is stable -Hold HCTZ at this time due to hyponatremia below.  Continue losartan. -Monitor blood pressure closely.     Hyperlipidemia: Continue statin as tolerated   Hyponatremia: In the setting of HCTZ use -HCTZ on hold  -Sodium has since normalized   Hypokalemia:  -Replaced   History of right breast cancer: -S/p lumpectomy, radiation  therapy and chemotherapy in 2009 and right axillary lymph node removal -History of recurrent breast cellulitis in the past -Seems stable at this time   Coronary artery disease: -S/p left heart cath in 9/21 -Continue aspirin, statin, Plavix   Asymptomatic right carotid artery stenosis status post right carotid endarterectomy in 2020 -Continue aspirin, statin.   Anxiety: Continue as needed Xanax  DVT prophylaxis: Lovenox subq Code Status: Full Family Communication: Pt in room, family not at bedside  Status is: Inpatient  Remains inpatient appropriate because:Inpatient level of care appropriate due to severity of illness  Dispo: The patient is from: Home              Anticipated d/c is to: Home              Patient currently is not medically stable to d/c.   Difficult to place patient No   Consultants:    Procedures:    Antimicrobials: Anti-infectives (From admission, onward)    Start     Dose/Rate Route Frequency Ordered Stop   02/14/21 2000  Ampicillin-Sulbactam (UNASYN) 3 g in sodium chloride 0.9 % 100 mL IVPB        3 g 200 mL/hr over 30 Minutes Intravenous Every 6 hours 02/14/21 1405     02/14/21 1430  Ampicillin-Sulbactam (UNASYN) 3 g in sodium chloride 0.9 % 100 mL IVPB        3 g 200 mL/hr over 30 Minutes Intravenous STAT 02/14/21 1404 02/14/21 1534   02/14/21 0245  Ampicillin-Sulbactam (UNASYN) 3 g in sodium chloride 0.9 % 100 mL IVPB        3 g  200 mL/hr over 30 Minutes Intravenous  Once 02/14/21 0231 02/14/21 0339       Subjective: Swelling around the hand and right elbow have improved.  Patient still complaining of pain and swelling in the upper right arm  Objective: Vitals:   02/15/21 1319 02/15/21 2142 02/16/21 0539 02/16/21 1407  BP: 140/77 (!) 149/78 (!) 145/82 (!) 145/75  Pulse: 89 84 78 86  Resp: '18 18 16 14  '$ Temp: 98.5 F (36.9 C) 99.1 F (37.3 C) 99.2 F (37.3 C) 98.5 F (36.9 C)  TempSrc: Oral Oral Oral Oral  SpO2: 91% 95% 95% 92%   Weight:   96 kg   Height:        Intake/Output Summary (Last 24 hours) at 02/16/2021 1455 Last data filed at 02/16/2021 1330 Gross per 24 hour  Intake 5382.41 ml  Output --  Net 5382.41 ml    Filed Weights   02/14/21 0055 02/15/21 0200 02/16/21 0539  Weight: 88.5 kg 94.4 kg 96 kg    Examination: General exam: Conversant, in no acute distress Respiratory system: normal chest rise, clear, no audible wheezing Cardiovascular system: regular rhythm, s1-s2 Gastrointestinal system: Nondistended, nontender, pos BS Central nervous system: No seizures, no tremors Extremities: No cyanosis, no joint deformities, R arm swelling improving Skin: No rashes, no pallor Psychiatry: Affect normal // no auditory hallucinations    Data Reviewed: I have personally reviewed following labs and imaging studies  CBC: Recent Labs  Lab 02/14/21 0304 02/14/21 1107 02/15/21 0329 02/16/21 0334  WBC 15.7* 12.6* 9.0 6.8  NEUTROABS 13.1*  --   --   --   HGB 14.0 13.4 12.3 11.9*  HCT 40.2 39.3 36.4 35.5*  MCV 93.3 95.2 95.3 95.9  PLT 378 348 323 [id number]    Basic Metabolic Panel: Recent Labs  Lab 02/14/21 0304 02/14/21 1107 02/14/21 1212 02/15/21 0329 02/16/21 0334  NA 129*  --   --  137 139  K 3.4*  --   --  4.1 4.4  CL 89*  --   --  102 101  CO2 26  --   --  27 30  GLUCOSE 108*  --   --  129* 112*  BUN 13  --   --  10 14  CREATININE 0.66 0.72  --  0.56 0.60  CALCIUM 9.2  --   --  8.5* 8.9  MG  --   --  2.2  --   --     GFR: Estimated Creatinine Clearance: 82.6 mL/min (by C-G formula based on SCr of 0.6 mg/dL). Liver Function Tests: Recent Labs  Lab 02/16/21 0334  AST 15  ALT 18  ALKPHOS 40  BILITOT 0.5  PROT 6.4*  ALBUMIN 3.5   No results for input(s): LIPASE, AMYLASE in the last 168 hours. No results for input(s): AMMONIA in the last 168 hours. Coagulation Profile: No results for input(s): INR, PROTIME in the last 168 hours. Cardiac Enzymes: No results for input(s):  CKTOTAL, CKMB, CKMBINDEX, TROPONINI in the last 168 hours. BNP (last 3 results) No results for input(s): PROBNP in the last 8760 hours. HbA1C: No results for input(s): HGBA1C in the last 72 hours. CBG: No results for input(s): GLUCAP in the last 168 hours. Lipid Profile: No results for input(s): CHOL, HDL, LDLCALC, TRIG, CHOLHDL, LDLDIRECT in the last 72 hours. Thyroid Function Tests: No results for input(s): TSH, T4TOTAL, FREET4, T3FREE, THYROIDAB in the last 72 hours. Anemia Panel: No results for input(s): VITAMINB12, FOLATE, FERRITIN, TIBC,  IRON, RETICCTPCT in the last 72 hours. Sepsis Labs: No results for input(s): PROCALCITON, LATICACIDVEN in the last 168 hours.  Recent Results (from the past 240 hour(s))  Blood culture (routine x 2)     Status: None (Preliminary result)   Collection Time: 02/14/21  3:00 AM   Specimen: Left Antecubital; Blood  Result Value Ref Range Status   Specimen Description   Final    LEFT ANTECUBITAL Performed at [facility], [address] [place], [place] [address]    Special Requests   Final    BOTTLES DRAWN AEROBIC AND ANAEROBIC Blood Culture results may not be optimal due to an inadequate volume of blood received in culture bottles Performed at [facility], [address] [place], [place] [address]    Culture   Final    NO GROWTH 2 DAYS Performed at [facility], [place] [address] [place], [place] [address]    Report Status PENDING  Incomplete  Blood culture (routine x 2)     Status: None (Preliminary result)   Collection Time: 02/14/21  3:01 AM   Specimen: BLOOD LEFT HAND  Result Value Ref Range Status   Specimen Description   Final    BLOOD LEFT HAND Performed at [facility], [address] [place], [place] [address]    Special Requests   Final    BOTTLES DRAWN AEROBIC AND ANAEROBIC Blood Culture results may not be optimal due to an inadequate volume of blood received in culture bottles Performed  at [facility], [address] [place], [place] [address]    Culture   Final    NO GROWTH 2 DAYS Performed at [facility], [place] [address] [place],  [address]    Report Status PENDING  Incomplete  Resp Panel by RT-PCR (Flu A&B, Covid) Nasopharyngeal Swab     Status: None   Collection Time: 02/14/21  3:28 AM   Specimen: Nasopharyngeal Swab; Nasopharyngeal(NP) swabs in vial transport medium  Result Value Ref Range Status   SARS Coronavirus 2 by RT PCR NEGATIVE NEGATIVE Final    Comment: (NOTE) SARS-CoV-2 target nucleic acids are NOT DETECTED.  The SARS-CoV-2 RNA is generally detectable in upper respiratory specimens during the acute phase of infection. The lowest concentration of SARS-CoV-2 viral copies this assay can detect is 138 copies/mL. A negative result does not preclude SARS-Cov-2 infection and should not be used as the sole basis for treatment or other patient management decisions. A negative result may occur with  improper specimen collection/handling, submission of specimen other than nasopharyngeal swab, presence of viral mutation(s) within the areas targeted by this assay, and inadequate number of viral copies(<138 copies/mL). A negative result must be combined with clinical observations, patient history, and epidemiological information. The expected result is Negative.  Fact Sheet for Patients:  EntrepreneurPulse.com.au  Fact Sheet for Healthcare Providers:  IncredibleEmployment.be  This test is no t yet approved or cleared by the [place] FDA and  has been authorized for detection and/or diagnosis of SARS-CoV-2 by FDA under an Emergency Use Authorization (EUA). This EUA will remain  in effect (meaning this test can be used) for the duration of the COVID-19 declaration under Section 564(b)(1) of the Act, 21 U.S.C.section 360bbb-3(b)(1), unless the authorization is terminated  or revoked sooner.        Influenza A by PCR NEGATIVE NEGATIVE Final   Influenza B by PCR NEGATIVE NEGATIVE Final  Comment: (NOTE) The Xpert Xpress SARS-CoV-2/FLU/RSV plus assay is intended as an aid in the diagnosis of influenza from Nasopharyngeal swab specimens and should not be used as a sole basis for treatment. Nasal washings and aspirates are unacceptable for Xpert Xpress SARS-CoV-2/FLU/RSV testing.  Fact Sheet for Patients: EntrepreneurPulse.com.au  Fact Sheet for Healthcare Providers: IncredibleEmployment.be  This test is not yet approved or cleared by the [place] FDA and has been authorized for detection and/or diagnosis of SARS-CoV-2 by FDA under an Emergency Use Authorization (EUA). This EUA will remain in effect (meaning this test can be used) for the duration of the COVID-19 declaration under Section 564(b)(1) of the Act, 21 U.S.C. section 360bbb-3(b)(1), unless the authorization is terminated or revoked.  Performed at [facility] [address] [place], [place] [address]       Radiology Studies: No results found.  Scheduled Meds:  aspirin  81 mg Oral Daily   atorvastatin  10 mg Oral Daily   clopidogrel  75 mg Oral Daily   enoxaparin (LOVENOX) injection  40 mg Subcutaneous Q24H   losartan  25 mg Oral Daily   neomycin-bacitracin-polymyxin   Topical Daily   Continuous Infusions:  sodium chloride 75 mL/hr at 02/15/21 1552   ampicillin-sulbactam (UNASYN) IV 3 g (02/16/21 0826)     LOS: 2 days   Marylu Lund, MD [facility] Pager On Amion  If 7PM-7AM, please contact night-coverage 02/16/2021, 2:55 PM

## 2021-02-17 LAB — CBC
HCT: 36.2 % (ref 36.0–46.0)
Hemoglobin: 11.9 g/dL — ABNORMAL LOW (ref 12.0–15.0)
MCH: 32.2 pg (ref 26.0–34.0)
MCHC: 32.9 g/dL (ref 30.0–36.0)
MCV: 97.8 fL (ref 80.0–100.0)
Platelets: 372 10*3/uL (ref 150–400)
RBC: 3.7 MIL/uL — ABNORMAL LOW (ref 3.87–5.11)
RDW: 12.4 % (ref 11.5–15.5)
WBC: 6.1 10*3/uL (ref 4.0–10.5)
nRBC: 0 % (ref 0.0–0.2)

## 2021-02-17 LAB — COMPREHENSIVE METABOLIC PANEL
ALT: 16 U/L (ref 0–44)
AST: 14 U/L — ABNORMAL LOW (ref 15–41)
Albumin: 3.2 g/dL — ABNORMAL LOW (ref 3.5–5.0)
Alkaline Phosphatase: 39 U/L (ref 38–126)
Anion gap: 6 (ref 5–15)
BUN: 16 mg/dL (ref 8–23)
CO2: 29 mmol/L (ref 22–32)
Calcium: 8.8 mg/dL — ABNORMAL LOW (ref 8.9–10.3)
Chloride: 100 mmol/L (ref 98–111)
Creatinine, Ser: 0.61 mg/dL (ref 0.44–1.00)
GFR, Estimated: >60 mL/min (ref >60)
Glucose, Bld: 134 mg/dL — ABNORMAL HIGH (ref 70–99)
Potassium: 4.1 mmol/L (ref 3.5–5.1)
Sodium: 135 mmol/L (ref 135–145)
Total Bilirubin: 0.7 mg/dL (ref 0.3–1.2)
Total Protein: 6.2 g/dL — ABNORMAL LOW (ref 6.5–8.1)

## 2021-02-17 MED ORDER — AMOXICILLIN-POT CLAVULANATE 875-125 MG PO TABS: 1.0000 | ORAL_TABLET | Freq: Two times a day (BID) | ORAL | 0 refills | Status: AC

## 2021-02-17 MED ORDER — OXYCODONE HCL 10 MG PO TABS: 10.0000 mg | ORAL_TABLET | ORAL | 0 refills | Status: DC | PRN

## 2021-02-17 NOTE — Progress Notes (Signed)
   02/17/21 1100  Mobility  Activity Ambulated in hall  Level of Assistance Standby assist, set-up cues, supervision of patient - no hands on  Assistive Device Other (Comment) (Iv pole)  Distance Ambulated (ft) 450 ft  Mobility Ambulated with assistance in hallway  Mobility Response Tolerated well  Mobility performed by Mobility specialist  $Mobility charge 1 Mobility   Upon entering the room, pt was ambulating to bathroom independently. Ambulated in hall with IV pole about 442f, tolerated well. Pt stated she walks independently in her room, and olny pushes IV pole because it's "attached" to her. Left pt in bed with call bell at side. Notified RN of session.   KFloralaSpecialist Acute Rehab Services Office: [phone]

## 2021-02-17 NOTE — Evaluation (Signed)
Occupational Therapy Evaluation Patient Details Name: Madison West MRN: [id number] DOB: 1955/12/10 Today's Date: 02/17/2021    History of Present Illness Patient is a 65 year old female who presented to the hospital on 8/26 after cat bite on R forearm with edema and pain.patient was found to have cellulitis of right arm. PMH: breast cancer, CAD, HTN, anxiety   Clinical Impression   Patient evaluated by Occupational Therapy with no further acute OT needs identified. All education has been completed and the patient has no further questions. Patient was able to preform AROM HEP provided with MI with education on how to self monitor. Patient and nursing educated on proper positioning of RUE to reduce edema. Patient is at baseline for ADLs at this time. See below for any follow-up Occupational Therapy or equipment needs. OT is signing off. Thank you for this referral.     Follow Up Recommendations  No OT follow up    Equipment Recommendations  None recommended by OT    Recommendations for Other Services       Precautions / Restrictions Precautions Precaution Comments: wound on R forearm, no BP on RUE Restrictions Weight Bearing Restrictions: No      Mobility Bed Mobility Overal bed mobility: Independent                  Transfers Overall transfer level: Independent                    Balance Overall balance assessment: Modified Independent                                         ADL either performed or assessed with clinical judgement   ADL Overall ADL's : Modified independent                                       General ADL Comments: patient is able to complete ADL tasks with increased time and noted pain in RUE with patient still able to take herself to bathroom with IV pole. patient was educated on proper positioning for RUE to reduce dependent positioning of entire UE to reduce edema. patient was educated on AROM to  maintain ROM while pain and edema continue to reduce. patient was educated on importance of trying to encourporate RUE with functional tasks while pain is reduced to maintain ability to engage in ADLs. patient verbalized and demonstrated understanding. patient inquired about using RUE v.s. resting. patient was re educated on using RUE for functional tasks and keeping elevated when at rest with slow increase to functional activities. patient verbalized understanding. patient was provided with HEP for ROM of RUE. patient was able to verbalized and demonstrate understanding. patient was educated on using light straight object in hand to measure how far she was able to complete supine/pronation. patient having increased difficulty with supination with able to reach a few degrees past neutral with increased pain in forearm. patient reported she would continue to work on it.     Vision Patient Visual Report: No change from baseline       Perception     Praxis      Pertinent Vitals/Pain Pain Assessment: 0-10 Pain Score: 6  Pain Location: R forearm Pain Descriptors / Indicators: Constant;Sore Pain Intervention(s): Monitored during session;Other (comment) (pain  medication was administered prior to session)     Hand Dominance Right   Extremity/Trunk Assessment Upper Extremity Assessment Upper Extremity Assessment: RUE deficits/detail RUE Deficits / Details: patient was noted to have increased edema in R upper arm near axillary area with positioning of shoulder in lower position upon entrance to room RUE Coordination: decreased fine motor;decreased gross motor (patient unable to complete supination without increased pain. patient was able to complete wrist flexion extension, digit AROM and shoulder AROM with mininal pain.)   Lower Extremity Assessment Lower Extremity Assessment: Overall WFL for tasks assessed   Cervical / Trunk Assessment Cervical / Trunk Assessment: Normal   Communication  Communication Communication: No difficulties   Cognition Arousal/Alertness: Awake/alert Behavior During Therapy: WFL for tasks assessed/performed Overall Cognitive Status: Within Functional Limits for tasks assessed                                     General Comments  patient was noted to have increased edema from upper half of right upper arm proximal to axillary area.    Exercises Shoulder Exercises Shoulder Flexion: AROM;Right Shoulder Extension: AROM;Right Shoulder ABduction: AROM;Right Elbow Flexion: AROM;Right;Seated Elbow Extension: AROM;Right;Seated Wrist Flexion: Seated Wrist Extension: AROM;Right;Seated Digit Composite Flexion: AROM;Right;Seated Hand Exercises Forearm Supination: AROM;Right Forearm Pronation: AROM;Right Wrist Flexion: AROM;Right Wrist Extension: AROM;Right Wrist Ulnar Deviation: AROM;Right Wrist Radial Deviation: AROM;Right Opposition: AROM;Right   Shoulder Instructions      Home Living Family/patient expects to be discharged to:: Private residence Living Arrangements: Children;Other relatives Available Help at Discharge: Family Type of Home: House Home Access: Stairs to enter [employer] of Steps: 2 steps Entrance Stairs-Rails: Can reach both Home Layout: One level     Bathroom Shower/Tub: Walk-in shower;Tub/shower unit         Home Equipment: None          Prior Functioning/Environment Level of Independence: Independent                 OT Problem List: Decreased strength;Decreased range of motion;Decreased activity tolerance;Impaired UE functional use      OT Treatment/Interventions:      OT Goals(Current goals can be found in the care plan section) Acute Rehab OT Goals Patient Stated Goal: to reduce pain and edema in RUE OT Goal Formulation: All assessment and education complete, DC therapy  OT Frequency:     Barriers to D/C:            Co-evaluation              AM-PAC OT "6  Clicks" Daily Activity     Outcome Measure Help from another person eating meals?: None Help from another person taking care of personal grooming?: None Help from another person toileting, which includes using toliet, bedpan, or urinal?: None Help from another person bathing (including washing, rinsing, drying)?: None Help from another person to put on and taking off regular upper body clothing?: None Help from another person to put on and taking off regular lower body clothing?: None 6 Click Score: 24   End of Session Nurse Communication: Other (comment) (positioning of RUE)  Activity Tolerance: Patient tolerated treatment well Patient left: in bed;with call bell/phone within reach  OT Visit Diagnosis: Pain;Muscle weakness (generalized) (M62.81) Pain - Right/Left: Right Pain - part of body: Arm                Time: [id number] OT Time Calculation (  min): 46 min Charges:  OT General Charges $OT Visit: 1 Visit OT Evaluation $OT Eval Low Complexity: 1 Low OT Treatments $Self Care/Home Management : 23-37 mins  Jackelyn Poling OTR/L, MS Acute Rehabilitation Department Office# [phone] Pager# [phone]   Merlene Morse Salah Burlison 02/17/2021, 9:01 AM

## 2021-02-17 NOTE — Progress Notes (Signed)
Reviewed written d/c information w pt and all questions answered. Pt verbalized understanding. Pt d/c per w/c w all belongings in stable condition, pt stated that her daughter would pick her up in the lobby at 1700.

## 2021-02-17 NOTE — Discharge Summary (Signed)
Physician Discharge Summary  Madison West [id number] DOB: April 18, 1956 DOA: 02/14/2021  PCP: Fay Records, MD  Admit date: 02/14/2021 Discharge date: 02/17/2021  Admitted From: Home Disposition:  Home  Recommendations for Outpatient Follow-up:  Follow up with PCP in 1-2 weeks  Discharge Condition:Improved CODE STATUS:Full Diet recommendation: Heart healthy   Brief/Interim Summary: 65 y.o. female with medical history significant of hypertension, hyperlipidemia, anxiety, right breast cancer status post chemo and radiation therapy,, chronic back pain presents to the emergency department with cat bite on her right arm 2 days ago she was prescribed amoxicillin which she was taking it as prescribed (took total 3 doses ) however her right forearm has become progressively erythematous, painful and swollen.  She had severe pain 8 out of 10, worse with the movements, she had a low-grade fever of 100.3 therefore she went to the med [facility] for further evaluation and management  Discharge Diagnoses:  Principal Problem:   Cellulitis Active Problems:   Hypertension   Depression   Coronary artery disease of native artery of native heart with stable angina pectoris (HCC)   Pure hypercholesterolemia   Infected cat bite   Hyponatremia   Hypokalemia  Cellulitis of right arm in the setting of cat bite: -Failed outpatient antibiotic therapy.  Presented with low-grade fever and leukocytosis. -Blood culture negative -Swelling has improved with unasyn -Leukocytosis has normalized, patient is afebrile -Patient discharged home with Augmentin to complete course of treatment   Hypertension: Blood pressure is stable -Hold HCTZ at this time due to hyponatremia below.   -Continued losartan. -Monitor blood pressure closely.     Hyperlipidemia: Continue statin as tolerated   Hyponatremia: In the setting of HCTZ use -HCTZ on hold  -Sodium has since normalized   Hypokalemia:   -Replaced   History of right breast cancer: -S/p lumpectomy, radiation therapy and chemotherapy in 2009 and right axillary lymph node removal -History of recurrent breast cellulitis in the past -Currently stable at this time   Coronary artery disease: -S/p left heart cath in 9/21 -Continue aspirin, statin, Plavix   Asymptomatic right carotid artery stenosis status post right carotid endarterectomy in 2020 -Continue aspirin, statin.   Anxiety: Continued as needed Xanax    Discharge Instructions   Allergies as of 02/17/2021       Reactions   Prednisone Palpitations   Fentanyl Other (See Comments)   MAKES SUPER HYPER PER PT   Midazolam Anxiety   Vicodin [hydrocodone-acetaminophen] Anxiety   Extreme anxiety.        Medication List     TAKE these medications    acetaminophen 500 MG tablet Commonly known as: TYLENOL Take 1,000 mg by mouth every 6 (six) hours as needed for fever or mild pain.   alprazolam 2 MG tablet Commonly known as: XANAX 1/2-1 tid prn anxiety. What changed:  how much to take how to take this when to take this reasons to take this additional instructions   amoxicillin-clavulanate 875-125 MG tablet Commonly known as: Augmentin Take 1 tablet by mouth 2 (two) times daily for 5 days.   aspirin 81 MG chewable tablet Chew 81 mg by mouth daily.   atorvastatin 10 MG tablet Commonly known as: Lipitor Take 1 tablet (10 mg total) by mouth daily. What changed:  how much to take when to take this   clopidogrel 75 MG tablet Commonly known as: Plavix Take 1 tablet (75 mg total) by mouth daily.   CO Q 10 PO Take 1 capsule by mouth  daily.   cyclobenzaprine 5 MG tablet Commonly known as: FLEXERIL Take 1 tablet (5 mg total) by mouth 3 (three) times daily as needed for muscle spasms.   Fish Oil 1200 MG Caps Take 1,200 mg by mouth daily.   Garlic [id number] MG Caps Take 500 mg by mouth daily.   losartan-hydrochlorothiazide 50-12.5 MG  tablet Commonly known as: HYZAAR Take 0.5 tablets by mouth daily.   nitroGLYCERIN 0.4 MG SL tablet Commonly known as: NITROSTAT Place 0.4 mg under the tongue every 5 (five) minutes as needed for chest pain.   Oxycodone HCl 10 MG Tabs Take 1 tablet (10 mg total) by mouth every 4 (four) hours as needed for moderate pain.   VITAMIN C PO Take 1 tablet by mouth daily.   VITAMIN D3 PO Take 1 capsule by mouth daily.   ZINC PO Take 1 tablet by mouth daily.        Follow-up Information     Fay Records, MD Follow up in 2 week(s).   Specialty: Obstetrics and Gynecology Why: Hospital follow up Contact information: [place] [address] [phone]                Allergies  Allergen Reactions   Prednisone Palpitations   Fentanyl Other (See Comments)    MAKES SUPER HYPER PER PT   Midazolam Anxiety   Vicodin [Hydrocodone-Acetaminophen] Anxiety    Extreme anxiety.    Consultations:   Procedures/Studies: No results found.  Subjective: R arm improved  Discharge Exam: Vitals:   02/16/21 2134 02/17/21 0536  BP: (!) 152/88 124/83  Pulse: 78 75  Resp: 18 16  Temp: 98.7 F (37.1 C) 98.2 F (36.8 C)  SpO2: 94% 95%   Vitals:   02/16/21 1407 02/16/21 2134 02/17/21 0500 02/17/21 0536  BP: (!) 145/75 (!) 152/88  124/83  Pulse: 86 78  75  Resp: '14 18  16  '$ Temp: 98.5 F (36.9 C) 98.7 F (37.1 C)  98.2 F (36.8 C)  TempSrc: Oral Oral  Oral  SpO2: 92% 94%  95%  Weight:   95.4 kg   Height:        General: Pt is alert, awake, not in acute distress Cardiovascular: RRR, S1/S2  Respiratory: CTA bilaterally, no wheezing, no rhonchi Abdominal: Soft, NT, ND, bowel sounds + Extremities: no edema, no cyanosis   The results of significant diagnostics from this hospitalization (including imaging, microbiology, ancillary and laboratory) are listed below for reference.     Microbiology: Recent Results (from the past 240 hour(s))  Blood  culture (routine x 2)     Status: None (Preliminary result)   Collection Time: 02/14/21  3:00 AM   Specimen: Left Antecubital; Blood  Result Value Ref Range Status   Specimen Description   Final    LEFT ANTECUBITAL Performed at [facility], [address] [place], [place] [address]    Special Requests   Final    BOTTLES DRAWN AEROBIC AND ANAEROBIC Blood Culture results may not be optimal due to an inadequate volume of blood received in culture bottles Performed at [facility], [address] [place], [place] [address]    Culture   Final    NO GROWTH 3 DAYS Performed at [facility], [place] [address] [place], [place] [address]    Report Status PENDING  Incomplete  Blood culture (routine x 2)     Status: None (Preliminary result)   Collection Time: 02/14/21  3:01 AM   Specimen: BLOOD LEFT HAND  Result Value Ref Range Status   Specimen Description   Final    BLOOD LEFT HAND Performed at [facility], [address] [place], [place] [address]    Special Requests   Final    BOTTLES DRAWN AEROBIC AND ANAEROBIC Blood Culture results may not be optimal due to an inadequate volume of blood received in culture bottles Performed at [facility], [address] [place], [place] [address]    Culture   Final    NO GROWTH 3 DAYS Performed at [facility], [place] [address], [place], [place] [address]    Report Status PENDING  Incomplete  Resp Panel by RT-PCR (Flu A&B, Covid) Nasopharyngeal Swab     Status: None   Collection Time: 02/14/21  3:28 AM   Specimen: Nasopharyngeal Swab; Nasopharyngeal(NP) swabs in vial transport medium  Result Value Ref Range Status   SARS Coronavirus 2 by RT PCR NEGATIVE NEGATIVE Final    Comment: (NOTE) SARS-CoV-2 target nucleic acids are NOT DETECTED.  The SARS-CoV-2 RNA is generally detectable in upper respiratory specimens during the acute phase of infection. The lowest concentration of SARS-CoV-2  viral copies this assay can detect is 138 copies/mL. A negative result does not preclude SARS-Cov-2 infection and should not be used as the sole basis for treatment or other patient management decisions. A negative result may occur with  improper specimen collection/handling, submission of specimen other than nasopharyngeal swab, presence of viral mutation(s) within the areas targeted by this assay, and inadequate number of viral copies(<138 copies/mL). A negative result must be combined with clinical observations, patient history, and epidemiological information. The expected result is Negative.  Fact Sheet for Patients:  EntrepreneurPulse.com.au  Fact Sheet for Healthcare Providers:  IncredibleEmployment.be  This test is no t yet approved or cleared by the [place] FDA and  has been authorized for detection and/or diagnosis of SARS-CoV-2 by FDA under an Emergency Use Authorization (EUA). This EUA will remain  in effect (meaning this test can be used) for the duration of the COVID-19 declaration under Section 564(b)(1) of the Act, 21 U.S.C.section 360bbb-3(b)(1), unless the authorization is terminated  or revoked sooner.       Influenza A by PCR NEGATIVE NEGATIVE Final   Influenza B by PCR NEGATIVE NEGATIVE Final    Comment: (NOTE) The Xpert Xpress SARS-CoV-2/FLU/RSV plus assay is intended as an aid in the diagnosis of influenza from Nasopharyngeal swab specimens and should not be used as a sole basis for treatment. Nasal washings and aspirates are unacceptable for Xpert Xpress SARS-CoV-2/FLU/RSV testing.  Fact Sheet for Patients: EntrepreneurPulse.com.au  Fact Sheet for Healthcare Providers: IncredibleEmployment.be  This test is not yet approved or cleared by the [place] FDA and has been authorized for detection and/or diagnosis of SARS-CoV-2 by FDA under an Emergency Use Authorization  (EUA). This EUA will remain in effect (meaning this test can be used) for the duration of the COVID-19 declaration under Section 564(b)(1) of the Act, 21 U.S.C. section 360bbb-3(b)(1), unless the authorization is terminated or revoked.  Performed at [facility], [address] [place], [place] [address]      Labs: BNP (last 3 results) No results for input(s): BNP in the last 8760 hours. Basic Metabolic Panel: Recent Labs  Lab 02/14/21 0304 02/14/21 1107 02/14/21 1212 02/15/21 0329 02/16/21 0334 02/17/21 0328  NA 129*  --   --  137  139 135  K 3.4*  --   --  4.1 4.4 4.1  CL 89*  --   --  102 101 100  CO2 26  --   --  '27 30 29  '$ GLUCOSE 108*  --   --  129* 112* 134*  BUN 13  --   --  '10 14 16  '$ CREATININE 0.66 0.72  --  0.56 0.60 0.61  CALCIUM 9.2  --   --  8.5* 8.9 8.8*  MG  --   --  2.2  --   --   --    Liver Function Tests: Recent Labs  Lab 02/16/21 0334 02/17/21 0328  AST 15 14*  ALT 18 16  ALKPHOS 40 39  BILITOT 0.5 0.7  PROT 6.4* 6.2*  ALBUMIN 3.5 3.2*   No results for input(s): LIPASE, AMYLASE in the last 168 hours. No results for input(s): AMMONIA in the last 168 hours. CBC: Recent Labs  Lab 02/14/21 0304 02/14/21 1107 02/15/21 0329 02/16/21 0334 02/17/21 0328  WBC 15.7* 12.6* 9.0 6.8 6.1  NEUTROABS 13.1*  --   --   --   --   HGB 14.0 13.4 12.3 11.9* 11.9*  HCT 40.2 39.3 36.4 35.5* 36.2  MCV 93.3 95.2 95.3 95.9 97.8  PLT 378 348 323 363 372   Cardiac Enzymes: No results for input(s): CKTOTAL, CKMB, CKMBINDEX, TROPONINI in the last 168 hours. BNP: Invalid input(s): POCBNP CBG: No results for input(s): GLUCAP in the last 168 hours. D-Dimer No results for input(s): DDIMER in the last 72 hours. Hgb A1c No results for input(s): HGBA1C in the last 72 hours. Lipid Profile No results for input(s): CHOL, HDL, LDLCALC, TRIG, CHOLHDL, LDLDIRECT in the last 72 hours. Thyroid function studies No results for input(s): TSH, T4TOTAL, T3FREE,  THYROIDAB in the last 72 hours.  Invalid input(s): FREET3 Anemia work up No results for input(s): VITAMINB12, FOLATE, FERRITIN, TIBC, IRON, RETICCTPCT in the last 72 hours. Urinalysis    Component Value Date/Time   COLORURINE STRAW (A) 06/06/2019 1159   APPEARANCEUR CLEAR 06/06/2019 1159   LABSPEC 1.005 06/06/2019 1159   PHURINE 7.0 06/06/2019 1159   GLUCOSEU NEGATIVE 06/06/2019 1159   HGBUR SMALL (A) 06/06/2019 1159   BILIRUBINUR NEGATIVE 06/06/2019 1159   BILIRUBINUR neg 10/04/2013 2039   KETONESUR NEGATIVE 06/06/2019 1159   PROTEINUR NEGATIVE 06/06/2019 1159   UROBILINOGEN 0.2 10/04/2013 2039   NITRITE NEGATIVE 06/06/2019 1159   LEUKOCYTESUR NEGATIVE 06/06/2019 1159   Sepsis Labs Invalid input(s): PROCALCITONIN,  WBC,  LACTICIDVEN Microbiology Recent Results (from the past 240 hour(s))  Blood culture (routine x 2)     Status: None (Preliminary result)   Collection Time: 02/14/21  3:00 AM   Specimen: Left Antecubital; Blood  Result Value Ref Range Status   Specimen Description   Final    LEFT ANTECUBITAL Performed at [facility], [address] [place], [place] [address]    Special Requests   Final    BOTTLES DRAWN AEROBIC AND ANAEROBIC Blood Culture results may not be optimal due to an inadequate volume of blood received in culture bottles Performed at [facility], [address] [place], [place] [address]    Culture   Final    NO GROWTH 3 DAYS Performed at [facility], [place] [address], [place], [place] [address]    Report Status PENDING  Incomplete  Blood culture (routine x 2)     Status: None (Preliminary  result)   Collection Time: 02/14/21  3:01 AM   Specimen: BLOOD LEFT HAND  Result Value Ref Range Status   Specimen Description   Final    BLOOD LEFT HAND Performed at [facility], [address] [place], [place] [address]    Special Requests   Final    BOTTLES DRAWN AEROBIC AND ANAEROBIC Blood Culture results may  not be optimal due to an inadequate volume of blood received in culture bottles Performed at [facility], [address] [place], [place] [address]    Culture   Final    NO GROWTH 3 DAYS Performed at [facility], [place] [address] [place], [place] [address]    Report Status PENDING  Incomplete  Resp Panel by RT-PCR (Flu A&B, Covid) Nasopharyngeal Swab     Status: None   Collection Time: 02/14/21  3:28 AM   Specimen: Nasopharyngeal Swab; Nasopharyngeal(NP) swabs in vial transport medium  Result Value Ref Range Status   SARS Coronavirus 2 by RT PCR NEGATIVE NEGATIVE Final    Comment: (NOTE) SARS-CoV-2 target nucleic acids are NOT DETECTED.  The SARS-CoV-2 RNA is generally detectable in upper respiratory specimens during the acute phase of infection. The lowest concentration of SARS-CoV-2 viral copies this assay can detect is 138 copies/mL. A negative result does not preclude SARS-Cov-2 infection and should not be used as the sole basis for treatment or other patient management decisions. A negative result may occur with  improper specimen collection/handling, submission of specimen other than nasopharyngeal swab, presence of viral mutation(s) within the areas targeted by this assay, and inadequate number of viral copies(<138 copies/mL). A negative result must be combined with clinical observations, patient history, and epidemiological information. The expected result is Negative.  Fact Sheet for Patients:  EntrepreneurPulse.com.au  Fact Sheet for Healthcare Providers:  IncredibleEmployment.be  This test is no t yet approved or cleared by the [place] FDA and  has been authorized for detection and/or diagnosis of SARS-CoV-2 by FDA under an Emergency Use Authorization (EUA). This EUA will remain  in effect (meaning this test can be used) for the duration of the COVID-19 declaration under Section 564(b)(1) of the Act,  21 U.S.C.section 360bbb-3(b)(1), unless the authorization is terminated  or revoked sooner.       Influenza A by PCR NEGATIVE NEGATIVE Final   Influenza B by PCR NEGATIVE NEGATIVE Final    Comment: (NOTE) The Xpert Xpress SARS-CoV-2/FLU/RSV plus assay is intended as an aid in the diagnosis of influenza from Nasopharyngeal swab specimens and should not be used as a sole basis for treatment. Nasal washings and aspirates are unacceptable for Xpert Xpress SARS-CoV-2/FLU/RSV testing.  Fact Sheet for Patients: EntrepreneurPulse.com.au  Fact Sheet for Healthcare Providers: IncredibleEmployment.be  This test is not yet approved or cleared by the [place] FDA and has been authorized for detection and/or diagnosis of SARS-CoV-2 by FDA under an Emergency Use Authorization (EUA). This EUA will remain in effect (meaning this test can be used) for the duration of the COVID-19 declaration under Section 564(b)(1) of the Act, 21 U.S.C. section 360bbb-3(b)(1), unless the authorization is terminated or revoked.  Performed at [facility], [address] [place], [place] [address]    Time spent: 73mn  SIGNED:   SMarylu Lund MD  [facility] 02/17/2021, 1:42 PM  If 7PM-7AM, please contact night-coverage

## 2021-02-19 LAB — CULTURE, BLOOD (ROUTINE X 2)
Culture: NO GROWTH
Culture: NO GROWTH

## 2021-03-05 ENCOUNTER — Ambulatory Visit: Payer: Medicare Other | Admitting: Cardiology

## 2021-03-18 ENCOUNTER — Encounter: Payer: Self-pay | Admitting: Cardiology

## 2021-03-18 ENCOUNTER — Other Ambulatory Visit: Payer: Self-pay

## 2021-03-18 ENCOUNTER — Ambulatory Visit: Payer: Medicare Other | Admitting: Cardiology

## 2021-03-18 VITALS — BP 136/80 | HR 83 | Temp 98.1°F | Resp 16 | Ht 68.0 in | Wt 198.8 lb

## 2021-03-18 DIAGNOSIS — E782 Mixed hyperlipidemia: Secondary | ICD-10-CM

## 2021-03-18 DIAGNOSIS — I10 Essential (primary) hypertension: Secondary | ICD-10-CM | POA: Diagnosis not present

## 2021-03-18 DIAGNOSIS — Z9889 Other specified postprocedural states: Secondary | ICD-10-CM | POA: Diagnosis not present

## 2021-03-18 DIAGNOSIS — I25118 Atherosclerotic heart disease of native coronary artery with other forms of angina pectoris: Secondary | ICD-10-CM

## 2021-03-18 NOTE — Progress Notes (Signed)
Primary Physician/Referring:  Fay Records, MD  Patient ID: Madison West, female    DOB: 10-10-1955, 65 y.o.   MRN: [id number]  Chief Complaint  Patient presents with   Coronary Artery Disease   CAROTID STENOSIS    HPI: Madison West  is a 65 y.o. female history of PSVT on 08/04/2016, terminated with adenosine, anxiety and depression, history of breast cancer status post radiation therapy and chemotherapy in 2009, hypertension, mild aortic valve stenosis, hyperlipidemia, palpitations, asymptomatic right carotid artery stenosis SP right carotid endarterectomy on 06/09/2019, CAD s/p stenting to RI on 02/27/2020 and has residual very small circumflex mid 70 to 80% stenosis being managed medically, extreme anxiety and multiple medication intolerances and phobia.   This is a 52-month office visit.  States that she is presently doing well and does not have the tightness in her chest with exertion activity. She is not happy taking the medications. Remains asymptomatic from cardiac standpoint.   Past Medical History:  Diagnosis Date   Anxiety    Arthritis    low back and hip pain intermittent   Breast cancer ([place]) 06/02/07   r breast -surgery ,radiaology. chemotherapy   Carpal tunnel syndrome    right hand   Colon polyps    Complication of anesthesia    Fentanyl, Versed-makes extra hyper, bradycardia x 1 in PACU, [facility] (08/15/11 cardiology felt neostigmine may have resulted in AV nodal block)    Coronary artery disease    Depression    denies   Dysplasia of vulva    Hypertension    Palpitations    PSVT, s/p adenosine 08/04/16   S/P breast lumpectomy 07/04/07   R breast   S/P radiation therapy 2009    Past Surgical History:  Procedure Laterality Date   APPENDECTOMY     age 58   CESAREAN SECTION     x 2   COLONOSCOPY W/ POLYPECTOMY     COLONOSCOPY WITH PROPOFOL N/A 04/27/2016   Procedure: COLONOSCOPY WITH PROPOFOL;  Surgeon: Garlan Fair, MD;  Location: [facility];   Service: Endoscopy;  Laterality: N/A;   DILATION AND CURETTAGE OF UTERUS     multiple   ENDARTERECTOMY Right 06/09/2019   ENDARTERECTOMY Right 06/09/2019   Procedure: ENDARTERECTOMY CAROTID RIGHT;  Surgeon: Elam Dutch, MD;  Location: [facility];  Service: Vascular;  Laterality: Right;   INTRAUTERINE DEVICE INSERTION     IUD REMOVAL     LEFT HEART CATH AND CORONARY ANGIOGRAPHY N/A 02/27/2020   Procedure: LEFT HEART CATH AND CORONARY ANGIOGRAPHY;  Surgeon: Adrian Prows, MD;  Location: [facility];  Service: Cardiovascular;  Laterality: N/A;   MASTECTOMY PARTIAL / LUMPECTOMY W/ AXILLARY LYMPHADENECTOMY Right    lumpectomy and lymph nodes removed   PATCH ANGIOPLASTY Right 06/09/2019   Procedure: Patch Angioplasty of right carotid artery using hemashield paltinum finesse patch;  Surgeon: Elam Dutch, MD;  Location: [facility];  Service: Vascular;  Laterality: Right;   TUBAL LIGATION     VULVA SURGERY     Multiple times for dysplasia    Social History   Tobacco Use   Smoking status: Former    Packs/day: 0.75    Years: 20.00    Pack years: 15.00    Types: Cigarettes    Quit date: 05/06/1997    Years since quitting: 23.8   Smokeless tobacco: Never  Substance Use Topics   Alcohol use: Yes    Alcohol/week: 0.0 standard drinks    Comment: occ  Marital Status: Widowed   Current Outpatient Medications on File Prior to Visit  Medication Sig Dispense Refill   acetaminophen (TYLENOL) 500 MG tablet Take 1,000 mg by mouth every 6 (six) hours as needed for fever or mild pain.     alprazolam (XANAX) 2 MG tablet 1/2-1 tid prn anxiety. (Patient taking differently: Take 1-2 mg by mouth 3 (three) times daily as needed for anxiety.) 15 tablet 0   Ascorbic Acid (VITAMIN C PO) Take 1 tablet by mouth daily.     aspirin 81 MG chewable tablet Chew 81 mg by mouth daily.      atorvastatin (LIPITOR) 10 MG tablet Take 1 tablet (10 mg total) by mouth daily. (Patient taking differently: Take 5 mg by  mouth every other day.) 90 tablet 3   Cholecalciferol (VITAMIN D3 PO) Take 1 capsule by mouth daily.     Coenzyme Q10 (CO Q 10 PO) Take 1 capsule by mouth daily.     Garlic 638 MG CAPS Take 500 mg by mouth daily.     losartan-hydrochlorothiazide (HYZAAR) 50-12.5 MG tablet Take 0.5 tablets by mouth daily. 90 tablet 3   Multiple Vitamins-Minerals (ZINC PO) Take 1 tablet by mouth daily.     nitroGLYCERIN (NITROSTAT) 0.4 MG SL tablet Place 0.4 mg under the tongue every 5 (five) minutes as needed for chest pain.     Omega-3 Fatty Acids (FISH OIL) 1200 MG CAPS Take 1,200 mg by mouth daily.     oxyCODONE 10 MG TABS Take 1 tablet (10 mg total) by mouth every 4 (four) hours as needed for moderate pain. (Patient not taking: Reported on 03/18/2021) 7 tablet 0   No current facility-administered medications on file prior to visit.   Review of Systems  Cardiovascular:  Negative for chest pain, dyspnea on exertion, leg swelling and palpitations.  Musculoskeletal:  Positive for joint pain.  Gastrointestinal:  Negative for melena.  Psychiatric/Behavioral:  Positive for depression and hypervigilance. The patient is nervous/anxious.   Objective  Blood pressure 136/80, pulse 83, temperature 98.1 F (36.7 C), temperature source Temporal, resp. rate 16, height 5\' 8"  (1.727 m), weight 198 lb 12.8 oz (90.2 kg), SpO2 98 %. Body mass index is 30.23 kg/m.   Vitals with BMI 03/18/2021 03/18/2021 02/17/2021  Height - 5\' 8"  -  Weight - 198 lbs 13 oz -  BMI - 75.64 -  Systolic [phone]  Diastolic 80 95 88  Pulse 83 90 81    Physical Exam Constitutional:      Appearance: She is well-developed.     Comments: Mildly obese  HENT:     Head: Atraumatic.  Neck:     Vascular: Carotid bruit (bilataral. Right CEA scar noted) present. No JVD.     Comments: Right carotid endarterectomy scar Cardiovascular:     Rate and Rhythm: Normal rate and regular rhythm.     Pulses: Normal pulses and intact distal pulses.      Heart sounds: Murmur heard.  Midsystolic murmur is present with a grade of 2/6 at the upper right sternal border.  Pulmonary:     Effort: Pulmonary effort is normal. No accessory muscle usage or respiratory distress.     Breath sounds: Normal breath sounds.  Abdominal:     General: Bowel sounds are normal.     Palpations: Abdomen is soft.  Musculoskeletal:        General: No swelling.  Neurological:     Mental Status: She is oriented to person, place, and  time.   Laboratory examination:   CMP Latest Ref Rng & Units 02/17/2021 02/16/2021 02/15/2021  Glucose 70 - 99 mg/dL 134(H) 112(H) 129(H)  BUN 8 - 23 mg/dL 16 14 10   Creatinine 0.44 - 1.00 mg/dL 0.61 0.60 0.56  Sodium 135 - 145 mmol/L 135 139 137  Potassium 3.5 - 5.1 mmol/L 4.1 4.4 4.1  Chloride 98 - 111 mmol/L 100 101 102  CO2 22 - 32 mmol/L 29 30 27   Calcium 8.9 - 10.3 mg/dL 8.8(L) 8.9 8.5(L)  Total Protein 6.5 - 8.1 g/dL 6.2(L) 6.4(L) -  Total Bilirubin 0.3 - 1.2 mg/dL 0.7 0.5 -  Alkaline Phos 38 - 126 U/L 39 40 -  AST 15 - 41 U/L 14(L) 15 -  ALT 0 - 44 U/L 16 18 -   CBC Latest Ref Rng & Units 02/17/2021 02/16/2021 02/15/2021  WBC 4.0 - 10.5 K/uL 6.1 6.8 9.0  Hemoglobin 12.0 - 15.0 g/dL 11.9(L) 11.9(L) 12.3  Hematocrit 36.0 - 46.0 % 36.2 35.5(L) 36.4  Platelets 150 - 400 K/uL 372 363 323   Lipid Panel Recent Labs    05/20/20 1406  CHOL 226*  TRIG 121  LDLCALC 143*  HDL 62     HEMOGLOBIN A1C Lab Results  Component Value Date   HGBA1C 5.8 (H) 10/15/2017   MPG 114 10/06/2011   TSH in my chart okay, no complaint No results for input(s): TSH in the last 8760 hours.   Cardiac Studies:   Exercise sestamibi stress test 04/04/2018: 1. The resting electrocardiogram demonstrated normal sinus rhythm, LAD, LAFB, incomplete RBBB, poor R progression and no resting arrhythmias.  The stress electrocardiogram was normal.  Patient exercised on Bruce protocol for 4:55 minutes and achieved 6.95 METS. Stress test terminated due to  chest pressure and 87% MPHR achieved (Target HR >85%). Resting BP 150/88 and pea BP 218/102 mm hg. 2. Myocardial perfusion imaging is normal. Overall left ventricular systolic function was normal without regional wall motion abnormalities. The left ventricular ejection fraction was 72%.  This is a low risk study. Chest pain could be related to hypertensive heart disease. Clinical correlation recommended.  Event monitor 30 days 03/11/2018: Episodic transmission 03/20/2018 1:30 pm: 5 beat NSVT.One brief atrial tach @ 142/min. Frequent PAC in bigeminal pattern, symptomatic. Rare PVC.  Echocardiogram 03/08/2018: Left ventricle cavity is normal in size. Moderate concentric hypertrophy of the left ventricle. Normal global wall motion. Doppler evidence of grade I (impaired) diastolic dysfunction, elevated LAP. Calculated EF 60%. Mild aortic valve leaflet calcification. Severely restricted aortic valve leaflets. Mild aortic valve stenosis. Trace aortic regurgitation. Aortic valve peak pressure gradient of 23 and mean gradient of 13 mmHg, calculated aortic valve area 1.59 cm. In 2-D, the valve appears to be severely restricted. Compared to the echocardiogram 09/09/2016, mean gradient has increased from 13 mmHg. Otherwise no significant change. Consider TEE if clinically indicated.  Left Heart Catheterization 02/27/20:  Normal LVEDP.  No pressure gradient across the aortic valve. RCA: Dominant vessel.  Mild diffuse disease. Left main: Large caliber vessel, mild calcification evident. LAD: Large caliber vessel.  Mild diffuse luminal irregularity. Circumflex: Small vessel, continues in the AV groove with a mid 80% stenosis. Ramus intermediate: It is a large vessel, gives origin to large lateral ramus branches, diffusely diseased, proximal segment has a 80 to 90% mildly calcified concentric stenosis.  Successful stenting with 3.0 x 18 mm resolute Onyx, distal end of the stent was in the diffusely diseased vessel and  there was a type B  dissection evident and the vessel was much larger than anticipated distally due to underfilling.  This was stented with a 3.0 x 12 mm resolute Onyx.  Overall stenosis reduced from diffusely diseased 90% to 0% with TIMI-3 to TIMI-3 flow maintained.   Recommendation: I have discussed with the patient regarding use of statins which is paramount.  She will be discharged home on aspirin along with Plavix.  135 mL contrast utilized.  Carotid artery duplex 08/13/2020: No hemodynamically significant arterial disease in the internal carotid artery bilaterally. Right carotid endarterectomy is widely patent. Antegrade right vertebral artery flow. Antegrade left vertebral artery flow. Follow up studies if clinically indicated. No significant change from 11/01/2019.  EKG:  EKG 03/18/2021: Sinus rhythm with first-degree AV block at rate of 85 bpm, left atrial enlargement, left axis deviation, left anterior fascicular block.  Incomplete right bundle branch block.  No significant change from 03/07/2020.  Assessment     ICD-10-CM   1. Coronary artery disease of native artery of native heart with stable angina pectoris ([place])  I25.118 EKG 12-Lead    2. Essential hypertension  I10 TSH    3. Mixed hyperlipidemia  E78.2 Lipid Panel With LDL/HDL Ratio    4. History of right-sided carotid endarterectomy  Z98.890      No orders of the defined types were placed in this encounter.   Medications Discontinued During This Encounter  Medication Reason   cyclobenzaprine (FLEXERIL) 5 MG tablet Error   clopidogrel (PLAVIX) 75 MG tablet Completed Course      Recommendations:   Madison West  is a 65 y.o. history of PSVT on 08/04/2016, terminated with adenosine, anxiety and depression, history of breast cancer status post radiation therapy and chemotherapy in 2009, hypertension, mild aortic valve stenosis, hyperlipidemia, palpitations, asymptomatic right carotid artery stenosis SP right carotid  endarterectomy on 06/09/2019, CAD s/p stenting to RI on 02/27/2020 and has residual very small circumflex mid 70 to 80% stenosis being managed medically, extreme anxiety and multiple medication intolerances and phobia.   She remains completely asymptomatic with regard to cardiac issues.  She has been not taking statins on a daily basis but has been taking it 3-4 times a week.  We discussed regarding PCSK9 inhibitors and also Leqvio, she does not want any injectables either.  With regard to coronary artery disease, she has not had any recurrence of angina pectoris.  As she has completed >1-year of dual antiplatelet therapy, will discontinue Plavix for now and continue aspirin indefinitely.  Will obtain lipid profile testing and TSH, blood pressure is well controlled, she remains asymptomatic and no change in physical exam including her heart murmur.  No change in carotid bruit.  I will see her back on annual basis.   Adrian Prows, MD, [facility] 03/18/2021, 1:34 PM Office: [phone]

## 2021-04-03 DIAGNOSIS — Z1152 Encounter for screening for COVID-19: Secondary | ICD-10-CM | POA: Diagnosis not present

## 2021-05-07 DIAGNOSIS — F411 Generalized anxiety disorder: Secondary | ICD-10-CM | POA: Diagnosis not present

## 2021-06-08 DIAGNOSIS — J019 Acute sinusitis, unspecified: Secondary | ICD-10-CM | POA: Diagnosis not present

## 2021-06-08 DIAGNOSIS — H6693 Otitis media, unspecified, bilateral: Secondary | ICD-10-CM | POA: Diagnosis not present

## 2021-06-18 DIAGNOSIS — F411 Generalized anxiety disorder: Secondary | ICD-10-CM | POA: Diagnosis not present

## 2021-09-17 ENCOUNTER — Other Ambulatory Visit: Payer: Self-pay

## 2021-09-17 ENCOUNTER — Telehealth: Payer: Self-pay | Admitting: Cardiology

## 2021-09-17 DIAGNOSIS — I10 Essential (primary) hypertension: Secondary | ICD-10-CM

## 2021-09-17 MED ORDER — LOSARTAN POTASSIUM-HCTZ 50-12.5 MG PO TABS: 0.5000 | ORAL_TABLET | Freq: Every day | ORAL | 3 refills | Status: DC

## 2021-09-17 NOTE — Telephone Encounter (Signed)
Med has benn refilled

## 2021-09-17 NOTE — Telephone Encounter (Signed)
Patient requesting medication refill for losartan-hydrochlorothiazide 50-12.5 MG to be sent through [facility]. ?

## 2022-03-18 ENCOUNTER — Ambulatory Visit: Payer: Medicare Other | Admitting: Cardiology

## 2022-04-22 ENCOUNTER — Ambulatory Visit: Payer: Medicare Other | Admitting: Cardiology

## 2022-05-05 ENCOUNTER — Ambulatory Visit: Payer: Medicare Other | Admitting: Cardiology

## 2022-05-05 ENCOUNTER — Encounter: Payer: Self-pay | Admitting: Cardiology

## 2022-05-05 VITALS — BP 138/80 | HR 88 | Resp 14 | Ht 68.0 in | Wt 205.4 lb

## 2022-05-05 DIAGNOSIS — I25118 Atherosclerotic heart disease of native coronary artery with other forms of angina pectoris: Secondary | ICD-10-CM

## 2022-05-05 DIAGNOSIS — I350 Nonrheumatic aortic (valve) stenosis: Secondary | ICD-10-CM

## 2022-05-05 DIAGNOSIS — E782 Mixed hyperlipidemia: Secondary | ICD-10-CM

## 2022-05-05 DIAGNOSIS — Z9889 Other specified postprocedural states: Secondary | ICD-10-CM

## 2022-05-05 DIAGNOSIS — I10 Essential (primary) hypertension: Secondary | ICD-10-CM

## 2022-05-05 MED ORDER — RED YEAST RICE 600 MG PO CAPS: 2.0000 | ORAL_CAPSULE | Freq: Every evening | ORAL | 3 refills | Status: DC

## 2022-05-05 MED ORDER — LOSARTAN POTASSIUM-HCTZ 50-12.5 MG PO TABS: 0.5000 | ORAL_TABLET | Freq: Every day | ORAL | 3 refills | Status: DC

## 2022-05-05 NOTE — Progress Notes (Signed)
Primary Physician/Referring:  Fay Records, MD  Patient ID: Madison West, female    DOB: 12/28/1955, 66 y.o.   MRN: [id number]  Chief Complaint  Patient presents with   Coronary Artery Disease   Hyperlipidemia   Hypertension    HPI: Madison West  is a 66 y.o. female patient with history of PSVT on 08/04/2016, terminated with adenosine, anxiety and depression, history of breast cancer status post radiation therapy and chemotherapy in 2009, hypertension, mild aortic valve stenosis, hyperlipidemia, SP right carotid endarterectomy on 06/09/2019, CAD s/p stenting to RI on 02/27/2020 and has residual very small circumflex mid 70 to 80% stenosis being managed medically, extreme anxiety and multiple medication intolerances and phobia.   This is a 1 year office visit.  States that she is presently doing well and does not have the tightness in her chest with exertion activity. She is not happy taking the medications. Remains asymptomatic from cardiac standpoint.   Past Medical History:  Diagnosis Date   Anxiety    Arthritis    low back and hip pain intermittent   Breast cancer ([place]) 06/02/07   r breast -surgery ,radiaology. chemotherapy   Carpal tunnel syndrome    right hand   Colon polyps    Complication of anesthesia    Fentanyl, Versed-makes extra hyper, bradycardia x 1 in PACU, [facility] (08/15/11 cardiology felt neostigmine may have resulted in AV nodal block)    Coronary artery disease    Depression    denies   Dysplasia of vulva    Hypertension    Palpitations    PSVT, s/p adenosine 08/04/16   S/P breast lumpectomy 07/04/07   R breast   S/P radiation therapy 2009    Past Surgical History:  Procedure Laterality Date   APPENDECTOMY     age 27   CESAREAN SECTION     x 2   COLONOSCOPY W/ POLYPECTOMY     COLONOSCOPY WITH PROPOFOL N/A 04/27/2016   Procedure: COLONOSCOPY WITH PROPOFOL;  Surgeon: Garlan Fair, MD;  Location: [facility];  Service: Endoscopy;   Laterality: N/A;   DILATION AND CURETTAGE OF UTERUS     multiple   ENDARTERECTOMY Right 06/09/2019   ENDARTERECTOMY Right 06/09/2019   Procedure: ENDARTERECTOMY CAROTID RIGHT;  Surgeon: Elam Dutch, MD;  Location: [place];  Service: Vascular;  Laterality: Right;   INTRAUTERINE DEVICE INSERTION     IUD REMOVAL     LEFT HEART CATH AND CORONARY ANGIOGRAPHY N/A 02/27/2020   Procedure: LEFT HEART CATH AND CORONARY ANGIOGRAPHY;  Surgeon: Adrian Prows, MD;  Location: [place] [facility];  Service: Cardiovascular;  Laterality: N/A;   MASTECTOMY PARTIAL / LUMPECTOMY W/ AXILLARY LYMPHADENECTOMY Right    lumpectomy and lymph nodes removed   PATCH ANGIOPLASTY Right 06/09/2019   Procedure: Patch Angioplasty of right carotid artery using hemashield paltinum finesse patch;  Surgeon: Elam Dutch, MD;  Location: [facility];  Service: Vascular;  Laterality: Right;   TUBAL LIGATION     VULVA SURGERY     Multiple times for dysplasia    Social History   Tobacco Use   Smoking status: Former    Packs/day: 0.75    Years: 20.00    Total pack years: 15.00    Types: Cigarettes    Quit date: 05/06/1997    Years since quitting: 25.0   Smokeless tobacco: Never  Substance Use Topics   Alcohol use: Yes    Alcohol/week: 0.0 standard drinks of alcohol  Comment: occ   Marital Status: Widowed    Allergy and Medications:    Allergies  Allergen Reactions   Prednisone Palpitations   Fentanyl Other (See Comments)    MAKES SUPER HYPER PER PT   Midazolam Anxiety   Vicodin [Hydrocodone-Acetaminophen] Anxiety    Extreme anxiety.     Current Outpatient Medications:    acetaminophen (TYLENOL) 500 MG tablet, Take 1,000 mg by mouth every 6 (six) hours as needed for fever or mild pain., Disp: , Rfl:    alprazolam (XANAX) 2 MG tablet, 1/2-1 tid prn anxiety. (Patient taking differently: Take 1-2 mg by mouth 3 (three) times daily as needed for anxiety.), Disp: 15 tablet, Rfl: 0   Ascorbic Acid (VITAMIN C PO), Take  1 tablet by mouth daily., Disp: , Rfl:    aspirin 81 MG chewable tablet, Chew 81 mg by mouth daily. , Disp: , Rfl:    Cholecalciferol (VITAMIN D3 PO), Take 1 capsule by mouth daily., Disp: , Rfl:    Coenzyme Q10 (CO Q 10 PO), Take 1 capsule by mouth daily., Disp: , Rfl:    Garlic 782 MG CAPS, Take 500 mg by mouth daily., Disp: , Rfl:    Multiple Vitamins-Minerals (ZINC PO), Take 1 tablet by mouth daily., Disp: , Rfl:    nitroGLYCERIN (NITROSTAT) 0.4 MG SL tablet, Place 0.4 mg under the tongue every 5 (five) minutes as needed for chest pain., Disp: , Rfl:    Omega-3 Fatty Acids (FISH OIL) 1200 MG CAPS, Take 1,200 mg by mouth daily., Disp: , Rfl:    Red Yeast Rice 600 MG CAPS, Take 2 capsules (1,200 mg total) by mouth every evening., Disp: 360 capsule, Rfl: 3   losartan-hydrochlorothiazide (HYZAAR) 50-12.5 MG tablet, Take 0.5 tablets by mouth daily., Disp: 90 tablet, Rfl: 3   Review of Systems  Cardiovascular:  Negative for chest pain, dyspnea on exertion and leg swelling.   Objective  Blood pressure 138/80, pulse 88, resp. rate 14, height '5\' 8"'$  (1.727 m), weight 205 lb 6.4 oz (93.2 kg), SpO2 97 %. Body mass index is 31.23 kg/m.      05/05/2022    1:51 PM 03/18/2021    1:17 PM 03/18/2021    1:16 PM  Vitals with BMI  Height '5\' 8"'$   '5\' 8"'$   Weight 205 lbs 6 oz  198 lbs 13 oz  BMI 95.62  13.08  Systolic [phone]  Diastolic 80 80 95  Pulse 88 83 90    Physical Exam Constitutional:      Appearance: She is well-developed.     Comments: Mildly obese  Neck:     Vascular: Carotid bruit (Right CEA scar noted. Left bruit) present. No JVD.     Comments: Right carotid endarterectomy scar Cardiovascular:     Rate and Rhythm: Normal rate and regular rhythm.     Pulses: Normal pulses and intact distal pulses.     Heart sounds: Murmur heard.     Midsystolic murmur is present with a grade of 3/6 at the upper right sternal border.  Pulmonary:     Effort: Pulmonary effort is normal. No accessory  muscle usage or respiratory distress.     Breath sounds: Normal breath sounds.  Abdominal:     General: Bowel sounds are normal.     Palpations: Abdomen is soft.  Musculoskeletal:     Right lower leg: No edema.  Neurological:     Mental Status: She is oriented to person, place, and time.  Laboratory examination:      Latest Ref Rng & Units 02/17/2021    3:28 AM 02/16/2021    3:34 AM 02/15/2021    3:29 AM  CMP  Glucose 70 - 99 mg/dL 134  112  129   BUN 8 - 23 mg/dL '16  14  10   '$ Creatinine 0.44 - 1.00 mg/dL 0.61  0.60  0.56   Sodium 135 - 145 mmol/L 135  139  137   Potassium 3.5 - 5.1 mmol/L 4.1  4.4  4.1   Chloride 98 - 111 mmol/L 100  101  102   CO2 22 - 32 mmol/L '29  30  27   '$ Calcium 8.9 - 10.3 mg/dL 8.8  8.9  8.5   Total Protein 6.5 - 8.1 g/dL 6.2  6.4    Total Bilirubin 0.3 - 1.2 mg/dL 0.7  0.5    Alkaline Phos 38 - 126 U/L 39  40    AST 15 - 41 U/L 14  15    ALT 0 - 44 U/L 16  18        Latest Ref Rng & Units 02/17/2021    3:28 AM 02/16/2021    3:34 AM 02/15/2021    3:29 AM  CBC  WBC 4.0 - 10.5 K/uL 6.1  6.8  9.0   Hemoglobin 12.0 - 15.0 g/dL 11.9  11.9  12.3   Hematocrit 36.0 - 46.0 % 36.2  35.5  36.4   Platelets 150 - 400 K/uL 372  363  323    Lipid Panel No results for input(s): "CHOL", "TRIG", "LDLCALC", "VLDL", "HDL", "CHOLHDL", "LDLDIRECT" in the last 8760 hours.    HEMOGLOBIN A1C Lab Results  Component Value Date   HGBA1C 5.8 (H) 10/15/2017   MPG 114 10/06/2011   TSH in my chart okay, no complaint No results for input(s): "TSH" in the last 8760 hours.   Cardiac Studies:   PCV ECHOCARDIOGRAM COMPLETE 01/18/2020  Narrative Echocardiogram 01/18/2020: Normal LV systolic function with visual EF 55-60%. Mild to moderate left ventricular hypertrophy. Left ventricle cavity is normal in size. Left ventricle regional wall motion findings: Mid inferior, Mid inferoseptal and Apical septal hypokinesis. Calculated EF 55%. Left atrial cavity is moderately  dilated. Mild (Grade I) aortic regurgitation. Peak velocity 2.20ms, mean gradient 17.575mg, Dimensional index 0.40, findings suggestive of mild to moderate aortic stenosis. Mild tricuspid regurgitation. Trivial pericardial effusion with no hemodynamic compromise. Compared to prior study dated 03/08/2018: LVEF remains preserved, now RWMAs as noted above, and AS is now mild to moderate.  PCV MYOCARDIAL PERFUSION WO LEXISCAN 01/31/2020  Narrative Exercise Myoview stress test 01/31/2020: Exercise nuclear stress test was performed using Bruce protocol. Patient reached 6.0 METS, and 88% of age predicted maximum heart rate. Exercise capacity was low. 8/10 chest pain reported. Heart rate and hemodynamic response were normal. Stress EKG shows minimal ST elevation in lead aVR, horizontal ST depressions 1 mm in leds V4-V6, and frequent PVC's. Normal myocardial perfusion. Stress LVEF 60%. In presence of exertional chest pain, recommend clinical correlation.   Left Heart Catheterization 02/27/20:  Normal LVEDP.  No pressure gradient across the aortic valve. RCA: Dominant vessel.  Mild diffuse disease. Left main: Large caliber vessel, mild calcification evident. LAD: Large caliber vessel.  Mild diffuse luminal irregularity. Circumflex: Small vessel, continues in the AV groove with a mid 80% stenosis. Ramus intermediate: It is a large vessel, gives origin to large lateral ramus branches, diffusely diseased, proximal segment has a 80 to 90% mildly  calcified concentric stenosis.  Successful stenting with 3.0 x 18 mm resolute Onyx, distal end of the stent was in the diffusely diseased vessel and there was a type B dissection evident and the vessel was much larger than anticipated distally due to underfilling.  This was stented with a 3.0 x 12 mm resolute Onyx.  Overall stenosis reduced from diffusely diseased 90% to 0% with TIMI-3 to TIMI-3 flow maintained.    02/27/2020: Prox RI 3.0 x 18 mm resolute Onyx, Mid  3.0 x 12 mm resolute Onyx  Carotid artery duplex 08/13/2020: No hemodynamically significant arterial disease in the internal carotid artery bilaterally. Right carotid endarterectomy is widely patent. Antegrade right vertebral artery flow. Antegrade left vertebral artery flow. Follow up studies if clinically indicated. No significant change from 11/01/2019.  EKG:  EKG 05/05/2022: Normal sinus rhythm at rate of 87 bpm, LAE, left axis deviation, left anterior fascicular block.  Incomplete right bundle branch block.  Poor R progression, probably normal variant.  No evidence of ischemia, normal QT interval.  Compared to 03/18/2021, no significant change.  Assessment     ICD-10-CM   1. Coronary artery disease of native artery of native heart with stable angina pectoris ([place])  I25.118 EKG 12-Lead    PCV ECHOCARDIOGRAM COMPLETE    2. History of right-sided carotid endarterectomy  Z98.890 PCV CAROTID DUPLEX (BILATERAL)    3. Mixed hyperlipidemia  E78.2     4. Essential hypertension  I10 losartan-hydrochlorothiazide (HYZAAR) 50-12.5 MG tablet    5. Mild aortic stenosis  I35.0 PCV ECHOCARDIOGRAM COMPLETE     Meds ordered this encounter  Medications   Red Yeast Rice 600 MG CAPS    Sig: Take 2 capsules (1,200 mg total) by mouth every evening.    Dispense:  360 capsule    Refill:  3   losartan-hydrochlorothiazide (HYZAAR) 50-12.5 MG tablet    Sig: Take 0.5 tablets by mouth daily.    Dispense:  90 tablet    Refill:  3    Medications Discontinued During This Encounter  Medication Reason   atorvastatin (LIPITOR) 10 MG tablet    oxyCODONE 10 MG TABS    losartan-hydrochlorothiazide (HYZAAR) 50-12.5 MG tablet Reorder     Recommendations:   Madison West  is a 66 y.o. history of PSVT on 08/04/2016, terminated with adenosine, anxiety and depression, history of breast cancer status post radiation therapy and chemotherapy in 2009, hypertension, mild aortic valve stenosis, hyperlipidemia, SP  right carotid endarterectomy on 06/09/2019, CAD s/p stenting to RI on 02/27/2020 and has residual very small circumflex mid 70 to 80% stenosis being managed medically, extreme anxiety and multiple medication intolerances and phobia.   1. Coronary artery disease of native artery of native heart with stable angina pectoris [facility]) Patient remains angina free.  She is presently on aspirin alone.  She could not tolerate beta-blockers likely caused her to have marked hypotension.  Blood pressure is well controlled.  2. History of right-sided carotid endarterectomy She has not had carotid artery duplex, she does have faint carotid bruit, will repeat carotid artery duplex for surveillance.  3. Mixed hyperlipidemia Patient does not want to consider any statin therapy or PCSK9 inhibitors.  I have convinced her to see if she could at least take red yeast rice 2 capsules in the evening, Rx sent.  No need for evaluation of lipid profile in view of patient not wanting to be on a statin  4. Essential hypertension Blood pressure is well controlled on present medical  regimen.  I refilled the prescription.  5. Mild aortic stenosis Aortic stenotic murmur appears slightly more prominent.  We will repeat echocardiogram.  Unless this is significant abnormalities, office visit in 1 year.   Adrian Prows, MD, [facility] 05/05/2022, 2:29 PM Office: [phone]

## 2022-05-19 ENCOUNTER — Other Ambulatory Visit: Payer: Medicare Other

## 2022-05-25 ENCOUNTER — Ambulatory Visit: Payer: Medicare Other

## 2022-05-25 DIAGNOSIS — I25118 Atherosclerotic heart disease of native coronary artery with other forms of angina pectoris: Secondary | ICD-10-CM

## 2022-05-25 DIAGNOSIS — I350 Nonrheumatic aortic (valve) stenosis: Secondary | ICD-10-CM

## 2022-05-25 DIAGNOSIS — Z9889 Other specified postprocedural states: Secondary | ICD-10-CM

## 2022-08-03 ENCOUNTER — Telehealth: Payer: Self-pay

## 2022-08-03 NOTE — Telephone Encounter (Signed)
Patient called and stated that she has been having chest pains and feels like " heart is about to come out of her chest". Call transferred to front desk to schedule appointment. Spoke with [facility] front desk, appointment is Tues 08/04/2022 @ 10:15.

## 2022-08-04 ENCOUNTER — Encounter: Payer: Self-pay | Admitting: Cardiology

## 2022-08-04 ENCOUNTER — Ambulatory Visit: Payer: Medicare Other | Admitting: Cardiology

## 2022-08-04 VITALS — BP 130/80 | HR 87 | Ht 68.0 in | Wt 210.0 lb

## 2022-08-04 DIAGNOSIS — Z9889 Other specified postprocedural states: Secondary | ICD-10-CM

## 2022-08-04 DIAGNOSIS — I25118 Atherosclerotic heart disease of native coronary artery with other forms of angina pectoris: Secondary | ICD-10-CM

## 2022-08-04 DIAGNOSIS — E782 Mixed hyperlipidemia: Secondary | ICD-10-CM

## 2022-08-04 DIAGNOSIS — I10 Essential (primary) hypertension: Secondary | ICD-10-CM

## 2022-08-04 MED ORDER — AMLODIPINE BESYLATE 2.5 MG PO TABS: 2.5000 mg | ORAL_TABLET | Freq: Every day | ORAL | 2 refills | Status: DC

## 2022-08-04 MED ORDER — NITROGLYCERIN 0.4 MG SL SUBL: 0.4000 mg | SUBLINGUAL_TABLET | SUBLINGUAL | 2 refills | Status: DC | PRN

## 2022-08-04 NOTE — Progress Notes (Unsigned)
Primary Physician/Referring:  Fay Records, MD  Patient ID: Madison West, female    DOB: 04-Oct-1955, 67 y.o.   MRN: [id number]  Chief Complaint  Patient presents with   Coronary artery disease of native artery of native heart wi   Follow-up    HPI: Madison West  is a 67 y.o. female patient with history of PSVT on 08/04/2016, terminated with adenosine, anxiety and depression, history of breast cancer status post radiation therapy and chemotherapy in 2009, hypertension, mild aortic valve stenosis, hyperlipidemia, SP right carotid endarterectomy on 06/09/2019, CAD s/p stenting to RI on 02/27/2020 and has residual very small circumflex mid 70 to 80% stenosis being managed medically, extreme anxiety and multiple medication intolerances and phobia.   This is a 1 year office visit.  States that she is presently doing well and does not have the tightness in her chest with exertion activity. She is not happy taking the medications. Remains asymptomatic from cardiac standpoint.   Past Medical History:  Diagnosis Date   Anxiety    Arthritis    low back and hip pain intermittent   Breast cancer ([facility]) 06/02/07   r breast -surgery ,radiaology. chemotherapy   Carpal tunnel syndrome    right hand   Colon polyps    Complication of anesthesia    Fentanyl, Versed-makes extra hyper, bradycardia x 1 in PACU, [facility] (08/15/11 cardiology felt neostigmine may have resulted in AV nodal block)    Coronary artery disease    Depression    denies   Dysplasia of vulva    Hypertension    Palpitations    PSVT, s/p adenosine 08/04/16   S/P breast lumpectomy 07/04/07   R breast   S/P radiation therapy 2009    Past Surgical History:  Procedure Laterality Date   APPENDECTOMY     age 48   CESAREAN SECTION     x 2   COLONOSCOPY W/ POLYPECTOMY     COLONOSCOPY WITH PROPOFOL N/A 04/27/2016   Procedure: COLONOSCOPY WITH PROPOFOL;  Surgeon: Garlan Fair, MD;  Location: [facility];  Service:  Endoscopy;  Laterality: N/A;   DILATION AND CURETTAGE OF UTERUS     multiple   ENDARTERECTOMY Right 06/09/2019   ENDARTERECTOMY Right 06/09/2019   Procedure: ENDARTERECTOMY CAROTID RIGHT;  Surgeon: Elam Dutch, MD;  Location: [facility];  Service: Vascular;  Laterality: Right;   INTRAUTERINE DEVICE INSERTION     IUD REMOVAL     LEFT HEART CATH AND CORONARY ANGIOGRAPHY N/A 02/27/2020   Procedure: LEFT HEART CATH AND CORONARY ANGIOGRAPHY;  Surgeon: Adrian Prows, MD;  Location: [facility];  Service: Cardiovascular;  Laterality: N/A;   MASTECTOMY PARTIAL / LUMPECTOMY W/ AXILLARY LYMPHADENECTOMY Right    lumpectomy and lymph nodes removed   PATCH ANGIOPLASTY Right 06/09/2019   Procedure: Patch Angioplasty of right carotid artery using hemashield paltinum finesse patch;  Surgeon: Elam Dutch, MD;  Location: [facility];  Service: Vascular;  Laterality: Right;   TUBAL LIGATION     VULVA SURGERY     Multiple times for dysplasia    Social History   Tobacco Use   Smoking status: Former    Packs/day: 0.75    Years: 20.00    Total pack years: 15.00    Types: Cigarettes    Quit date: 05/06/1997    Years since quitting: 25.2   Smokeless tobacco: Never  Substance Use Topics   Alcohol use: Yes    Alcohol/week: 0.0 standard drinks of  alcohol    Comment: occ   Marital Status: Widowed    Allergy and Medications:    Allergies  Allergen Reactions   Prednisone Palpitations   Fentanyl Other (See Comments)    MAKES SUPER HYPER PER PT   Midazolam Anxiety   Vicodin [Hydrocodone-Acetaminophen] Anxiety    Extreme anxiety.     Current Outpatient Medications:    acetaminophen (TYLENOL) 500 MG tablet, Take 1,000 mg by mouth every 6 (six) hours as needed for fever or mild pain., Disp: , Rfl:    alprazolam (XANAX) 2 MG tablet, 1/2-1 tid prn anxiety. (Patient taking differently: Take 1-2 mg by mouth 3 (three) times daily as needed for anxiety.), Disp: 15 tablet, Rfl: 0   amLODipine (NORVASC)  2.5 MG tablet, Take 1 tablet (2.5 mg total) by mouth daily., Disp: 30 tablet, Rfl: 2   Ascorbic Acid (VITAMIN C PO), Take 1 tablet by mouth daily., Disp: , Rfl:    aspirin 81 MG chewable tablet, Chew 81 mg by mouth daily. , Disp: , Rfl:    Cholecalciferol (VITAMIN D3 PO), Take 1 capsule by mouth daily., Disp: , Rfl:    Coenzyme Q10 (CO Q 10 PO), Take 1 capsule by mouth daily., Disp: , Rfl:    Garlic [id number] MG CAPS, Take 500 mg by mouth daily., Disp: , Rfl:    losartan-hydrochlorothiazide (HYZAAR) 50-12.5 MG tablet, Take 0.5 tablets by mouth daily., Disp: 90 tablet, Rfl: 3   Omega-3 Fatty Acids (FISH OIL) 1200 MG CAPS, Take 1,200 mg by mouth daily., Disp: , Rfl:    nitroGLYCERIN (NITROSTAT) 0.4 MG SL tablet, Place 1 tablet (0.4 mg total) under the tongue every 5 (five) minutes as needed for chest pain., Disp: 25 tablet, Rfl: 2   Review of Systems  Cardiovascular:  Negative for chest pain, dyspnea on exertion and leg swelling.   Objective  Blood pressure 130/80, pulse 87, height 5' 8"$  (1.727 m), weight 210 lb (95.3 kg), SpO2 95 %. Body mass index is 31.93 kg/m.      08/04/2022   10:54 AM 08/04/2022   10:32 AM 05/05/2022    1:51 PM  Vitals with BMI  Height  5' 8"$  5' 8"$   Weight  210 lbs 205 lbs 6 oz  BMI  [id number]  Systolic [id number]  Diastolic 80 94 80  Pulse  87 88    Physical Exam Constitutional:      Appearance: She is well-developed.     Comments: Mildly obese  Neck:     Vascular: Carotid bruit (Right CEA scar noted. Left bruit) present. No JVD.     Comments: Right carotid endarterectomy scar Cardiovascular:     Rate and Rhythm: Normal rate and regular rhythm.     Pulses: Normal pulses and intact distal pulses.     Heart sounds: Murmur heard.     Midsystolic murmur is present with a grade of 3/6 at the upper right sternal border.  Pulmonary:     Effort: Pulmonary effort is normal. No accessory muscle usage or respiratory distress.     Breath sounds: Normal breath sounds.   Abdominal:     General: Bowel sounds are normal.     Palpations: Abdomen is soft.  Musculoskeletal:     Right lower leg: No edema.  Neurological:     Mental Status: She is oriented to person, place, and time.    Laboratory examination:   Lab Results  Component Value Date   NA 135 02/17/2021  K 4.1 02/17/2021   CO2 29 02/17/2021   GLUCOSE 134 (H) 02/17/2021   BUN 16 02/17/2021   CREATININE 0.61 02/17/2021   CALCIUM 8.8 (L) 02/17/2021   GFRNONAA >60 02/17/2021       Latest Ref Rng & Units 02/17/2021    3:28 AM 02/16/2021    3:34 AM 02/15/2021    3:29 AM  CMP  Glucose 70 - 99 mg/dL 134  112  129   BUN 8 - 23 mg/dL 16  14  10   $ Creatinine 0.44 - 1.00 mg/dL 0.61  0.60  0.56   Sodium 135 - 145 mmol/L 135  139  137   Potassium 3.5 - 5.1 mmol/L 4.1  4.4  4.1   Chloride 98 - 111 mmol/L 100  101  102   CO2 22 - 32 mmol/L 29  30  27   $ Calcium 8.9 - 10.3 mg/dL 8.8  8.9  8.5   Total Protein 6.5 - 8.1 g/dL 6.2  6.4    Total Bilirubin 0.3 - 1.2 mg/dL 0.7  0.5    Alkaline Phos 38 - 126 U/L 39  40    AST 15 - 41 U/L 14  15    ALT 0 - 44 U/L 16  18        Latest Ref Rng & Units 02/17/2021    3:28 AM 02/16/2021    3:34 AM 02/15/2021    3:29 AM  CBC  WBC 4.0 - 10.5 K/uL 6.1  6.8  9.0   Hemoglobin 12.0 - 15.0 g/dL 11.9  11.9  12.3   Hematocrit 36.0 - 46.0 % 36.2  35.5  36.4   Platelets 150 - 400 K/uL 372  363  323    Lab Results  Component Value Date   CHOL 226 (H) 05/20/2020   HDL 62 05/20/2020   LDLCALC 143 (H) 05/20/2020   TRIG 121 05/20/2020   CHOLHDL 4.2 01/19/2020    HEMOGLOBIN A1C Lab Results  Component Value Date   HGBA1C 5.8 (H) 10/15/2017   MPG 114 10/06/2011   Lab Results  Component Value Date   TSH 3.556 01/18/2018    Cardiac Studies:   PCV MYOCARDIAL PERFUSION WO LEXISCAN 01/31/2020  Narrative Exercise Myoview stress test 01/31/2020: Exercise nuclear stress test was performed using Bruce protocol. Patient reached 6.0 METS, and 88% of age predicted  maximum heart rate. Exercise capacity was low. 8/10 chest pain reported. Heart rate and hemodynamic response were normal. Stress EKG shows minimal ST elevation in lead aVR, horizontal ST depressions 1 mm in leds V4-V6, and frequent PVC's. Normal myocardial perfusion. Stress LVEF 60%. In presence of exertional chest pain, recommend clinical correlation.   Left Heart Catheterization 02/27/20:  Normal LVEDP.  No pressure gradient across the aortic valve. RCA: Dominant vessel.  Mild diffuse disease. Left main: Large caliber vessel, mild calcification evident. LAD: Large caliber vessel.  Mild diffuse luminal irregularity. Circumflex: Small vessel, continues in the AV groove with a mid 80% stenosis. Ramus intermediate: It is a large vessel, gives origin to large lateral ramus branches, diffusely diseased, proximal segment has a 80 to 90% mildly calcified concentric stenosis.  Successful stenting with 3.0 x 18 mm resolute Onyx, distal end of the stent was in the diffusely diseased vessel and there was a type B dissection evident and the vessel was much larger than anticipated distally due to underfilling.  This was stented with a 3.0 x 12 mm resolute Onyx.  Overall stenosis reduced from diffusely  diseased 90% to 0% with TIMI-3 to TIMI-3 flow maintained.    02/27/2020: Prox RI 3.0 x 18 mm resolute Onyx, Mid 3.0 x 12 mm resolute Onyx  Carotid artery duplex 05/25/2022: There is a patent endarterectomy site starting at the right carotid ICA-prox extending into the ICA-mid.  Duplex suggests stenosis in the right internal carotid artery (minimal). Duplex suggests stenosis in the left internal carotid artery (minimal). Antegrade right vertebral artery flow. Antegrade left vertebral artery flow. No significant change from 08/13/2020. Follow up study if clinically indicated.  PCV ECHOCARDIOGRAM COMPLETE 05/25/2022  Narrative Echocardiogram 05/25/2022: Left ventricle cavity is normal in size. Mild concentric  hypertrophy of the left ventricle. Normal global wall motion. Doppler evidence of grade I (impaired) diastolic dysfunction, normal LAP. Normal LV systolic function with visual EF 55-60%. Calculated EF 53%. Not well visualized. Moderate to severe calcification of the AV. Functionally bicuspid probably. Mild to moderate aortic stenosis. Trace aortic regurgitation. Moderate aortic valve leaflet calcification. Peak velocity  2.44 m/s, Peak Pressure Gradient  24 mmHg, Mean Gradient 13 mmHg, AVA 1.22cm, Dimensionless Index 0.4 Mild calcification of the mitral valve annulus. No mitral valve regurgitation. Compared to the study done on 01/18/2020, inferolateral hypokinesis scratch that inferoseptal hypokinesis no longer present.  Left atrium was previously moderately dilated. Aortic stenosis appears to be stable, previously mean gradient was 18 mmHg.  No change in dimensionless index.    EKG:  EKG 08/04/2022: Normal sinus rhythm at rate of 81 bpm, left atrial enlargement, left axis deviation, left anterior fascicular block.  No evidence of ischemia.  Compared to 05/05/2022, no change.  Assessment     ICD-10-CM   1. Coronary artery disease of native artery of native heart with stable angina pectoris (HCC)  I25.118 EKG 12-Lead    nitroGLYCERIN (NITROSTAT) 0.4 MG SL tablet    amLODipine (NORVASC) 2.5 MG tablet    PCV MYOCARDIAL PERFUSION WO LEXISCAN    CBC    Lipid Panel With LDL/HDL Ratio    CMP14+EGFR    2. History of right-sided carotid endarterectomy  Z98.890     3. Mixed hyperlipidemia  E78.2     4. Essential hypertension  I10      Meds ordered this encounter  Medications   nitroGLYCERIN (NITROSTAT) 0.4 MG SL tablet    Sig: Place 1 tablet (0.4 mg total) under the tongue every 5 (five) minutes as needed for chest pain.    Dispense:  25 tablet    Refill:  2   amLODipine (NORVASC) 2.5 MG tablet    Sig: Take 1 tablet (2.5 mg total) by mouth daily.    Dispense:  30 tablet    Refill:  2     Medications Discontinued During This Encounter  Medication Reason   Multiple Vitamins-Minerals (ZINC PO) Patient Preference   Red Yeast Rice 600 MG CAPS Patient Preference   nitroGLYCERIN (NITROSTAT) 0.4 MG SL tablet Reorder     Recommendations:   Madison West  is a 67 y.o. history of PSVT on 08/04/2016, terminated with adenosine, anxiety and depression, history of breast cancer status post radiation therapy and chemotherapy in 2009, hypertension, mild aortic valve stenosis, hyperlipidemia, SP right carotid endarterectomy on 06/09/2019, CAD s/p stenting to RI on 02/27/2020 and has residual very small circumflex mid 70 to 80% stenosis being managed medically, extreme anxiety and multiple medication intolerances and phobia.   1. Coronary artery disease of native artery of native heart with stable angina pectoris [facility]) Patient presenting with pain suggestive  angina pectoris, also patient has chest pain that is relieved with Tums versus GERD.  She is very afraid to use nitroglycerin.  Advised her that some of the symptoms that she is having especially when she is walking in cold weather is coronary spasm.  We discussed regarding stress testing versus cardiac catheterization, continued observation, she prefers to have stress testing.  I will start her on amlodipine 2.5 mg daily.  She has mild aortic valve stenosis, no change in murmur on physical exam.  Otherwise EKG also reveals normal sinus rhythm without ischemia.  2. History of right-sided carotid endarterectomy Carotid artery duplex reveals widely patent carotid endarterectomy site, no significant carotid disease, no further evaluation is indicated but will clinically follow.  3. Mixed hyperlipidemia She has not had any lipid testing in many years as she does not want to be on a statin.  Previously she was taking red yeast rice, I have encouraged her to go back on this.  This may be contributing to her recurrence of angina pectoris.  She is  willing to get blood work done, placed orders.  4. Essential hypertension Blood pressure was elevated upon presentation but normalized upon sitting.  I will also start her on 2.5 mg of amlodipine which may help.  I will see her back in 6 weeks for follow-up.     Adrian Prows, MD, [facility] 08/04/2022, 11:16 AM Office: [phone]

## 2022-08-05 MED ORDER — RED YEAST RICE 600 MG PO CAPS: 2.0000 | ORAL_CAPSULE | Freq: Every evening | ORAL | 3 refills | Status: DC

## 2022-08-18 ENCOUNTER — Ambulatory Visit: Payer: Medicare Other

## 2022-08-18 DIAGNOSIS — I25118 Atherosclerotic heart disease of native coronary artery with other forms of angina pectoris: Secondary | ICD-10-CM

## 2022-08-19 LAB — PCV MYOCARDIAL PERFUSION WO LEXISCAN
Angina Index: 0
ST Depression (mm): 0 mm

## 2022-08-20 NOTE — Progress Notes (Signed)
I called the patient and discussed with her regarding the stress test, overall low risk stress test.  Patient's symptoms of sharp retrosternal pain even doing minimal activity appears most consistent with musculoskeletal pain.  I advised her to use sublingual nitroglycerin to see if they should get any relief, patient states that he does not touch severe and also she is scared to take nitroglycerin.  I tried to reassure her.  Also reviewed her EKG which reveals no evidence of ischemia and discussed regarding hypertensive blood pressure response.  She is skeptical of making any changes to the medications, she agrees to increase losartan HCT from 50/12.5 mg 1/2 tablet daily to a whole tablet.  She did not want to go up on the dose of amlodipine.  She will continue to monitor her blood pressure and let us know if it remains elevated.  She is aware that she can always contact us if she continues to have chest pain.

## 2022-09-15 ENCOUNTER — Ambulatory Visit: Payer: Medicare Other | Admitting: Cardiology

## 2022-09-16 ENCOUNTER — Encounter: Payer: Self-pay | Admitting: Cardiology

## 2022-09-16 ENCOUNTER — Ambulatory Visit: Payer: Medicare Other | Admitting: Cardiology

## 2022-09-16 VITALS — BP 120/76 | HR 89 | Ht 68.0 in | Wt 211.0 lb

## 2022-09-16 DIAGNOSIS — E782 Mixed hyperlipidemia: Secondary | ICD-10-CM

## 2022-09-16 DIAGNOSIS — I10 Essential (primary) hypertension: Secondary | ICD-10-CM

## 2022-09-16 DIAGNOSIS — K219 Gastro-esophageal reflux disease without esophagitis: Secondary | ICD-10-CM

## 2022-09-16 DIAGNOSIS — I25118 Atherosclerotic heart disease of native coronary artery with other forms of angina pectoris: Secondary | ICD-10-CM

## 2022-09-16 MED ORDER — LOSARTAN POTASSIUM-HCTZ 50-12.5 MG PO TABS: 1.0000 | ORAL_TABLET | Freq: Every day | ORAL | 0 refills | Status: DC

## 2022-09-16 NOTE — Progress Notes (Signed)
Primary Physician/Referring:  Fay Records, MD  Patient ID: Madison West, female    DOB: Dec 18, 1955, 67 y.o.   MRN: [id number]  Chief Complaint  Patient presents with   Coronary artery disease of native artery of native heart wi   Follow-up    HPI: Berneda Vocke  is a 67 y.o. female patient with history of PSVT on 08/04/2016, terminated with adenosine, anxiety and depression, history of breast cancer status post radiation therapy and chemotherapy in 2009, hypertension, mild aortic valve stenosis, hyperlipidemia, SP right carotid endarterectomy on 06/09/2019, CAD s/p stenting to RI on 02/27/2020 and has residual very small circumflex mid 70 to 80% stenosis being managed medically, extreme anxiety and multiple medication intolerances and phobia.   She was seen on urgent basis for chest pain about 3 to 4 weeks, I scheduled her for a exercise nuclear stress test, she now presents for follow-up.  States that since the stress testing she has started being more physically active and has not noticed pain but continues to have some heartburn and also Tums does help her.  Dyspnea has remained stable.  Past Medical History:  Diagnosis Date   Anxiety    Arthritis    low back and hip pain intermittent   Breast cancer ([place]) 06/02/07   r breast -surgery ,radiaology. chemotherapy   Carpal tunnel syndrome    right hand   Colon polyps    Complication of anesthesia    Fentanyl, Versed-makes extra hyper, bradycardia x 1 in PACU, [facility] (08/15/11 cardiology felt neostigmine may have resulted in AV nodal block)    Coronary artery disease    Depression    denies   Dysplasia of vulva    Hypertension    Palpitations    PSVT, s/p adenosine 08/04/16   S/P breast lumpectomy 07/04/07   R breast   S/P radiation therapy 2009    Past Surgical History:  Procedure Laterality Date   APPENDECTOMY     age 1   CESAREAN SECTION     x 2   COLONOSCOPY W/ POLYPECTOMY     COLONOSCOPY WITH PROPOFOL N/A  04/27/2016   Procedure: COLONOSCOPY WITH PROPOFOL;  Surgeon: Garlan Fair, MD;  Location: [facility];  Service: Endoscopy;  Laterality: N/A;   DILATION AND CURETTAGE OF UTERUS     multiple   ENDARTERECTOMY Right 06/09/2019   ENDARTERECTOMY Right 06/09/2019   Procedure: ENDARTERECTOMY CAROTID RIGHT;  Surgeon: Elam Dutch, MD;  Location: [place];  Service: Vascular;  Laterality: Right;   INTRAUTERINE DEVICE INSERTION     IUD REMOVAL     LEFT HEART CATH AND CORONARY ANGIOGRAPHY N/A 02/27/2020   Procedure: LEFT HEART CATH AND CORONARY ANGIOGRAPHY;  Surgeon: Adrian Prows, MD;  Location: [place] [facility];  Service: Cardiovascular;  Laterality: N/A;   MASTECTOMY PARTIAL / LUMPECTOMY W/ AXILLARY LYMPHADENECTOMY Right    lumpectomy and lymph nodes removed   PATCH ANGIOPLASTY Right 06/09/2019   Procedure: Patch Angioplasty of right carotid artery using hemashield paltinum finesse patch;  Surgeon: Elam Dutch, MD;  Location: [facility];  Service: Vascular;  Laterality: Right;   TUBAL LIGATION     VULVA SURGERY     Multiple times for dysplasia    Social History   Tobacco Use   Smoking status: Former    Packs/day: 0.75    Years: 20.00    Additional pack years: 0.00    Total pack years: 15.00    Types: Cigarettes    Quit  date: 05/06/1997    Years since quitting: 25.3   Smokeless tobacco: Never  Substance Use Topics   Alcohol use: Yes    Alcohol/week: 0.0 standard drinks of alcohol    Comment: occ   Marital Status: Widowed    Review of Systems  Cardiovascular:  Positive for chest pain. Negative for dyspnea on exertion and leg swelling.  Gastrointestinal:  Positive for heartburn.   Objective  Blood pressure 120/76, pulse 89, height 5\' 8"  (1.727 m), weight 211 lb (95.7 kg), SpO2 92 %. Body mass index is 32.08 kg/m.      09/16/2022    1:41 PM 08/04/2022   10:54 AM 08/04/2022   10:32 AM  Vitals with BMI  Height 5\' 8"   5\' 8"   Weight 211 lbs  210 lbs  BMI [id number]  [id number]   Systolic [id number]  Diastolic 76 80 94  Pulse 89  87    Physical Exam Constitutional:      Appearance: She is well-developed.     Comments: Mildly obese  Neck:     Vascular: Carotid bruit (Right CEA scar noted. Left bruit) present. No JVD.     Comments: Right carotid endarterectomy scar Cardiovascular:     Rate and Rhythm: Normal rate and regular rhythm.     Pulses: Normal pulses and intact distal pulses.     Heart sounds: Murmur heard.     Midsystolic murmur is present with a grade of 3/6 at the upper right sternal border.  Pulmonary:     Effort: Pulmonary effort is normal. No accessory muscle usage or respiratory distress.     Breath sounds: Normal breath sounds.  Abdominal:     General: Bowel sounds are normal.     Palpations: Abdomen is soft.  Musculoskeletal:     Right lower leg: No edema.    Laboratory examination:   Lab Results  Component Value Date   NA 135 02/17/2021   K 4.1 02/17/2021   CO2 29 02/17/2021   GLUCOSE 134 (H) 02/17/2021   BUN 16 02/17/2021   CREATININE 0.61 02/17/2021   CALCIUM 8.8 (L) 02/17/2021   GFRNONAA >60 02/17/2021       Latest Ref Rng & Units 02/17/2021    3:28 AM 02/16/2021    3:34 AM 02/15/2021    3:29 AM  CMP  Glucose 70 - 99 mg/dL 134  112  129   BUN 8 - 23 mg/dL 16  14  10    Creatinine 0.44 - 1.00 mg/dL 0.61  0.60  0.56   Sodium 135 - 145 mmol/L 135  139  137   Potassium 3.5 - 5.1 mmol/L 4.1  4.4  4.1   Chloride 98 - 111 mmol/L 100  101  102   CO2 22 - 32 mmol/L 29  30  27    Calcium 8.9 - 10.3 mg/dL 8.8  8.9  8.5   Total Protein 6.5 - 8.1 g/dL 6.2  6.4    Total Bilirubin 0.3 - 1.2 mg/dL 0.7  0.5    Alkaline Phos 38 - 126 U/L 39  40    AST 15 - 41 U/L 14  15    ALT 0 - 44 U/L 16  18        Latest Ref Rng & Units 02/17/2021    3:28 AM 02/16/2021    3:34 AM 02/15/2021    3:29 AM  CBC  WBC 4.0 - 10.5 K/uL 6.1  6.8  9.0   Hemoglobin 12.0 -  15.0 g/dL 11.9  11.9  12.3   Hematocrit 36.0 - 46.0 % 36.2  35.5  36.4   Platelets  150 - 400 K/uL 372  363  323    Lab Results  Component Value Date   CHOL 226 (H) 05/20/2020   HDL 62 05/20/2020   LDLCALC 143 (H) 05/20/2020   TRIG 121 05/20/2020   CHOLHDL 4.2 01/19/2020    HEMOGLOBIN A1C Lab Results  Component Value Date   HGBA1C 5.8 (H) 10/15/2017   MPG 114 10/06/2011   Lab Results  Component Value Date   TSH 3.556 01/18/2018    Cardiac Studies:   Left Heart Catheterization 02/27/20:  Normal LVEDP.  No pressure gradient across the aortic valve. RCA: Dominant vessel.  Mild diffuse disease. Left main: Large caliber vessel, mild calcification evident. LAD: Large caliber vessel.  Mild diffuse luminal irregularity. Circumflex: Small vessel, continues in the AV groove with a mid 80% stenosis. Ramus intermediate: It is a large vessel, gives origin to large lateral ramus branches, diffusely diseased, proximal segment has a 80 to 90% mildly calcified concentric stenosis.  Successful stenting with 3.0 x 18 mm resolute Onyx, distal end of the stent was in the diffusely diseased vessel and there was a type B dissection evident and the vessel was much larger than anticipated distally due to underfilling.  This was stented with a 3.0 x 12 mm resolute Onyx.  Overall stenosis reduced from diffusely diseased 90% to 0% with TIMI-3 to TIMI-3 flow maintained.    02/27/2020: Prox RI 3.0 x 18 mm resolute Onyx, Mid 3.0 x 12 mm resolute Onyx  Carotid artery duplex 05/25/2022: There is a patent endarterectomy site starting at the right carotid ICA-prox extending into the ICA-mid.  Duplex suggests stenosis in the right internal carotid artery (minimal). Duplex suggests stenosis in the left internal carotid artery (minimal). Antegrade right vertebral artery flow. Antegrade left vertebral artery flow. No significant change from 08/13/2020. Follow up study if clinically indicated.  PCV ECHOCARDIOGRAM COMPLETE 05/25/2022  Narrative Echocardiogram 05/25/2022: Left ventricle cavity is  normal in size. Mild concentric hypertrophy of the left ventricle. Normal global wall motion. Doppler evidence of grade I (impaired) diastolic dysfunction, normal LAP. Normal LV systolic function with visual EF 55-60%. Calculated EF 53%. Not well visualized. Moderate to severe calcification of the AV. Functionally bicuspid probably. Mild to moderate aortic stenosis. Trace aortic regurgitation. Moderate aortic valve leaflet calcification. Peak velocity  2.44 m/s, Peak Pressure Gradient  24 mmHg, Mean Gradient 13 mmHg, AVA 1.22cm, Dimensionless Index 0.4 Mild calcification of the mitral valve annulus. No mitral valve regurgitation. Compared to the study done on 01/18/2020, inferolateral hypokinesis scratch that inferoseptal hypokinesis no longer present.  Left atrium was previously moderately dilated. Aortic stenosis appears to be stable, previously mean gradient was 18 mmHg.  No change in dimensionless index.   PCV MYOCARDIAL PERFUSION WO LEXISCAN 08/18/2022  Narrative Exercise nuclear stress test 08/18/2022: Myocardial perfusion is abnormal due to a prominent gut uptake artifact in the basal infero-septal region. Ischemia in this region cannot be completely excluded. Overall LV systolic function is normal without regional wall motion abnormalities. Stress LV EF: 55%. Normal ECG stress. The patient exercised for 3 minutes and 54 seconds of a Bruce protocol, achieving approximately 5.69 METs &  87% MPHR. No chest pain. Stress terminated due to THR achieved. The baseline blood pressure was 170/100 mmHg and increased to 240/120 mmHg, which is a hypertensive response to exercise. No previous exam available for comparison. Low  risk study.  EKG:  EKG 08/04/2022: Normal sinus rhythm at rate of 81 bpm, left atrial enlargement, left axis deviation, left anterior fascicular block.  No evidence of ischemia.  Compared to 05/05/2022, no change.   Allergy and Medications:    Allergies  Allergen Reactions    Prednisone Palpitations   Fentanyl Other (See Comments) and Swelling    MAKES SUPER HYPER PER PT  Makes pt "hyper" feels she is "climbing the walls."  Last time she had a colonoscopy with Fentanyl and Versed she was awake the whole time.   Hydrocodone-Acetaminophen Swelling    Wakes up in a "panic" if takes this, ok with oxycodone   Midazolam Anxiety and Swelling    Makes pt "hyper" feels she is "climbing the walls."  Last time she had a colonoscopy with Fentanyl and Versed she was awake the whole time.   Vicodin [Hydrocodone-Acetaminophen] Anxiety    Extreme anxiety.     Current Outpatient Medications:    acetaminophen (TYLENOL) 500 MG tablet, Take 1,000 mg by mouth every 6 (six) hours as needed for fever or mild pain., Disp: , Rfl:    alprazolam (XANAX) 2 MG tablet, 1/2-1 tid prn anxiety. (Patient taking differently: Take 1-2 mg by mouth 3 (three) times daily as needed for anxiety.), Disp: 15 tablet, Rfl: 0   Ascorbic Acid (VITAMIN C PO), Take 1 tablet by mouth daily., Disp: , Rfl:    aspirin 81 MG chewable tablet, Chew 81 mg by mouth daily. , Disp: , Rfl:    Cholecalciferol (VITAMIN D3 PO), Take 1 capsule by mouth daily., Disp: , Rfl:    Coenzyme Q10 (CO Q 10 PO), Take 1 capsule by mouth daily., Disp: , Rfl:    Garlic [id number] MG CAPS, Take 500 mg by mouth daily., Disp: , Rfl:    nitroGLYCERIN (NITROSTAT) 0.4 MG SL tablet, Place 1 tablet (0.4 mg total) under the tongue every 5 (five) minutes as needed for chest pain., Disp: 25 tablet, Rfl: 2   Omega-3 Fatty Acids (FISH OIL) 1200 MG CAPS, Take 1,200 mg by mouth daily., Disp: , Rfl:    amLODipine (NORVASC) 2.5 MG tablet, Take 1 tablet (2.5 mg total) by mouth daily. (Patient not taking: Reported on 09/16/2022), Disp: 30 tablet, Rfl: 2   losartan-hydrochlorothiazide (HYZAAR) 50-12.5 MG tablet, Take 1 tablet by mouth daily., Disp: 10 tablet, Rfl: 0   Red Yeast Rice 600 MG CAPS, Take 2 capsules (1,200 mg total) by mouth every evening. (Patient not  taking: Reported on 09/16/2022), Disp: 360 capsule, Rfl: 3   Assessment     ICD-10-CM   1. Coronary artery disease of native artery of native heart with stable angina pectoris ([place])  I25.118     2. Essential hypertension  I10 losartan-hydrochlorothiazide (HYZAAR) 50-12.5 MG tablet    3. Mixed hyperlipidemia  E78.2     4. Gastroesophageal reflux disease without esophagitis  K21.9 Ambulatory referral to Gastroenterology     Meds ordered this encounter  Medications   losartan-hydrochlorothiazide (HYZAAR) 50-12.5 MG tablet    Sig: Take 1 tablet by mouth daily.    Dispense:  10 tablet    Refill:  0    Medications Discontinued During This Encounter  Medication Reason   losartan-hydrochlorothiazide (HYZAAR) 50-12.5 MG tablet Reorder      Recommendations:   Francile Pekala  is a 67 y.o. history of PSVT on 08/04/2016, terminated with adenosine, anxiety and depression, history of breast cancer status post radiation therapy and chemotherapy in 2009,  hypertension, mild aortic valve stenosis, hyperlipidemia, SP right carotid endarterectomy on 06/09/2019, CAD s/p stenting to RI on 02/27/2020 and has residual very small circumflex mid 70 to 80% stenosis being managed medically, extreme anxiety and multiple medication intolerances and phobia.   1. Coronary artery disease of native artery of native heart with stable angina pectoris [facility]) Patient with coronary artery disease, chest pain symptoms are clearly not anginal pain.  She has been able to exert without inducing pain however she just gets precordial pain and I suspect is related to GERD as Tums does give her some relief.  I have made a referral for GI to evaluate her as she is also scheduled for colonoscopy soon.  She does not want to try PPI as she is afraid of medications.  2. Essential hypertension Blood pressure under excellent control since increasing the dose to losartan HCT.  She will continue the same, I sent a 10 tablet Rx to local  pharmacy as she is running out of the medication and mail-order pharmacies on this way. - losartan-hydrochlorothiazide (HYZAAR) 50-12.5 MG tablet; Take 1 tablet by mouth daily.  Dispense: 10 tablet; Refill: 0  3. Mixed hyperlipidemia As previously discussed, I again brought it up, she does not want to be on a statin.  4. Gastroesophageal reflux disease without esophagitis  - Ambulatory referral to Gastroenterology      Adrian Prows, MD, [facility] 09/16/2022, 2:43 PM Office: [phone]

## 2022-09-25 ENCOUNTER — Telehealth: Payer: Self-pay | Admitting: Cardiology

## 2022-09-25 DIAGNOSIS — K219 Gastro-esophageal reflux disease without esophagitis: Secondary | ICD-10-CM

## 2022-09-25 DIAGNOSIS — D369 Benign neoplasm, unspecified site: Secondary | ICD-10-CM

## 2022-09-25 NOTE — Telephone Encounter (Signed)
ICD-10-CM   1. Gastroesophageal reflux disease without esophagitis  K21.9 Ambulatory referral to Gastroenterology    2. Adenomatous polyps  D36.9 Ambulatory referral to Gastroenterology     Orders Placed This Encounter  Procedures   Ambulatory referral to Gastroenterology    Referral Priority:   Elective    Referral Type:   Consultation    Referral Reason:   Specialty Services Required    Referred to Provider:   Kathi Der, MD    Number of Visits Requested:   1    Patient has established relatino with Dr. Levora Angel

## 2022-09-30 ENCOUNTER — Other Ambulatory Visit: Payer: Self-pay

## 2022-09-30 DIAGNOSIS — I10 Essential (primary) hypertension: Secondary | ICD-10-CM

## 2022-09-30 MED ORDER — LOSARTAN POTASSIUM-HCTZ 50-12.5 MG PO TABS: 1.0000 | ORAL_TABLET | Freq: Every day | ORAL | 0 refills | Status: DC

## 2022-10-12 ENCOUNTER — Other Ambulatory Visit (HOSPITAL_BASED_OUTPATIENT_CLINIC_OR_DEPARTMENT_OTHER): Payer: Self-pay | Admitting: Gastroenterology

## 2022-10-12 DIAGNOSIS — R1013 Epigastric pain: Secondary | ICD-10-CM

## 2022-10-16 ENCOUNTER — Encounter (HOSPITAL_BASED_OUTPATIENT_CLINIC_OR_DEPARTMENT_OTHER): Payer: Self-pay

## 2022-10-16 ENCOUNTER — Ambulatory Visit (HOSPITAL_BASED_OUTPATIENT_CLINIC_OR_DEPARTMENT_OTHER)
Admission: RE | Admit: 2022-10-16 | Discharge: 2022-10-16 | Disposition: A | Discharge status: Home / Self Care | Payer: Medicare Other | Source: Ambulatory Visit | Attending: Gastroenterology | Admitting: Gastroenterology

## 2022-10-16 DIAGNOSIS — R1013 Epigastric pain: Secondary | ICD-10-CM

## 2022-10-16 MED ORDER — IOHEXOL 300 MG/ML  SOLN
80.0000 mL | Freq: Once | INTRAMUSCULAR | Status: AC | PRN
  Administered 2022-10-16: 80 mL via INTRAVENOUS

## 2022-12-31 LAB — CMP14+EGFR
ALT: 22 IU/L (ref 0–32)
AST: 16 IU/L (ref 0–40)
Albumin: 4.6 g/dL (ref 3.9–4.9)
Alkaline Phosphatase: 54 IU/L (ref 44–121)
BUN/Creatinine Ratio: 21 (ref 12–28)
BUN: 15 mg/dL (ref 8–27)
Bilirubin Total: 0.6 mg/dL (ref 0.0–1.2)
CO2: 25 mmol/L (ref 20–29)
Calcium: 9.9 mg/dL (ref 8.7–10.3)
Chloride: 98 mmol/L (ref 96–106)
Creatinine, Ser: 0.71 mg/dL (ref 0.57–1.00)
Globulin, Total: 2.1 g/dL (ref 1.5–4.5)
Glucose: 105 mg/dL — ABNORMAL HIGH (ref 70–99)
Potassium: 4.5 mmol/L (ref 3.5–5.2)
Sodium: 138 mmol/L (ref 134–144)
Total Protein: 6.7 g/dL (ref 6.0–8.5)
eGFR: 92 mL/min/{1.73_m2} (ref >59)

## 2022-12-31 LAB — LIPID PANEL WITH LDL/HDL RATIO
Cholesterol, Total: 220 mg/dL — ABNORMAL HIGH (ref 100–199)
HDL: 51 mg/dL (ref >39)
LDL Chol Calc (NIH): 146 mg/dL — ABNORMAL HIGH (ref 0–99)
LDL/HDL Ratio: 2.9 ratio (ref 0.0–3.2)
Triglycerides: 126 mg/dL (ref 0–149)
VLDL Cholesterol Cal: 23 mg/dL (ref 5–40)

## 2022-12-31 LAB — CBC
Hematocrit: 40.7 % (ref 34.0–46.6)
Hemoglobin: 14 g/dL (ref 11.1–15.9)
MCH: 32.6 pg (ref 26.6–33.0)
MCHC: 34.4 g/dL (ref 31.5–35.7)
MCV: 95 fL (ref 79–97)
Platelets: 412 10*3/uL (ref 150–450)
RBC: 4.3 x10E6/uL (ref 3.77–5.28)
RDW: 12.4 % (ref 11.7–15.4)
WBC: 6.4 10*3/uL (ref 3.4–10.8)

## 2023-01-22 ENCOUNTER — Telehealth: Payer: Self-pay

## 2023-01-22 DIAGNOSIS — R0609 Other forms of dyspnea: Secondary | ICD-10-CM

## 2023-01-22 NOTE — Telephone Encounter (Signed)
ICD-10-CM   1. Dyspnea on exertion  R06.09 Ambulatory referral to Pulmonology     Orders Placed This Encounter  Procedures   Ambulatory referral to Pulmonology    Referral Priority:   Routine    Referral Type:   Consultation    Referral Reason:   Specialty Services Required    Requested Specialty:   Pulmonary Disease    Number of Visits Requested:   1    No orders of the defined types were placed in this encounter.

## 2023-01-22 NOTE — Telephone Encounter (Signed)
Patient called with complaints of SOB. Patient has been transferred to front desk [facility]) to scheduled appointment. Patient has been advised to go to ED is symptoms worsen.

## 2023-01-23 ENCOUNTER — Other Ambulatory Visit: Payer: Self-pay

## 2023-01-23 ENCOUNTER — Emergency Department (HOSPITAL_BASED_OUTPATIENT_CLINIC_OR_DEPARTMENT_OTHER): Payer: Medicare Other

## 2023-01-23 ENCOUNTER — Inpatient Hospital Stay (HOSPITAL_BASED_OUTPATIENT_CLINIC_OR_DEPARTMENT_OTHER)
Admission: EM | Admit: 2023-01-23 | Discharge: 2023-02-18 | DRG: 266 | Disposition: A | Discharge status: Rehabilitation Facility | Payer: Medicare Other | Attending: Internal Medicine | Admitting: Internal Medicine

## 2023-01-23 DIAGNOSIS — Z800 Family history of malignant neoplasm of digestive organs: Secondary | ICD-10-CM

## 2023-01-23 DIAGNOSIS — Z006 Encounter for examination for normal comparison and control in clinical research program: Secondary | ICD-10-CM

## 2023-01-23 DIAGNOSIS — K5900 Constipation, unspecified: Secondary | ICD-10-CM | POA: Diagnosis not present

## 2023-01-23 DIAGNOSIS — E7800 Pure hypercholesterolemia, unspecified: Secondary | ICD-10-CM | POA: Diagnosis present

## 2023-01-23 DIAGNOSIS — T82855A Stenosis of coronary artery stent, initial encounter: Secondary | ICD-10-CM | POA: Diagnosis not present

## 2023-01-23 DIAGNOSIS — I200 Unstable angina: Secondary | ICD-10-CM | POA: Diagnosis present

## 2023-01-23 DIAGNOSIS — I5043 Acute on chronic combined systolic (congestive) and diastolic (congestive) heart failure: Secondary | ICD-10-CM | POA: Diagnosis present

## 2023-01-23 DIAGNOSIS — I350 Nonrheumatic aortic (valve) stenosis: Secondary | ICD-10-CM

## 2023-01-23 DIAGNOSIS — I110 Hypertensive heart disease with heart failure: Secondary | ICD-10-CM | POA: Diagnosis not present

## 2023-01-23 DIAGNOSIS — Z885 Allergy status to narcotic agent status: Secondary | ICD-10-CM

## 2023-01-23 DIAGNOSIS — I21A9 Other myocardial infarction type: Secondary | ICD-10-CM | POA: Diagnosis not present

## 2023-01-23 DIAGNOSIS — D75839 Thrombocytosis, unspecified: Secondary | ICD-10-CM | POA: Diagnosis not present

## 2023-01-23 DIAGNOSIS — I4901 Ventricular fibrillation: Secondary | ICD-10-CM | POA: Diagnosis not present

## 2023-01-23 DIAGNOSIS — E871 Hypo-osmolality and hyponatremia: Secondary | ICD-10-CM | POA: Diagnosis present

## 2023-01-23 DIAGNOSIS — R0602 Shortness of breath: Secondary | ICD-10-CM | POA: Diagnosis not present

## 2023-01-23 DIAGNOSIS — Z1152 Encounter for screening for COVID-19: Secondary | ICD-10-CM

## 2023-01-23 DIAGNOSIS — Z6834 Body mass index (BMI) 34.0-34.9, adult: Secondary | ICD-10-CM

## 2023-01-23 DIAGNOSIS — Z8249 Family history of ischemic heart disease and other diseases of the circulatory system: Secondary | ICD-10-CM

## 2023-01-23 DIAGNOSIS — Z79899 Other long term (current) drug therapy: Secondary | ICD-10-CM

## 2023-01-23 DIAGNOSIS — T502X5A Adverse effect of carbonic-anhydrase inhibitors, benzothiadiazides and other diuretics, initial encounter: Secondary | ICD-10-CM | POA: Diagnosis not present

## 2023-01-23 DIAGNOSIS — J189 Pneumonia, unspecified organism: Secondary | ICD-10-CM

## 2023-01-23 DIAGNOSIS — I4721 Torsades de pointes: Secondary | ICD-10-CM | POA: Diagnosis present

## 2023-01-23 DIAGNOSIS — I428 Other cardiomyopathies: Secondary | ICD-10-CM | POA: Diagnosis present

## 2023-01-23 DIAGNOSIS — N179 Acute kidney failure, unspecified: Secondary | ICD-10-CM | POA: Diagnosis not present

## 2023-01-23 DIAGNOSIS — R1310 Dysphagia, unspecified: Secondary | ICD-10-CM | POA: Diagnosis not present

## 2023-01-23 DIAGNOSIS — I5023 Acute on chronic systolic (congestive) heart failure: Secondary | ICD-10-CM | POA: Diagnosis present

## 2023-01-23 DIAGNOSIS — I25110 Atherosclerotic heart disease of native coronary artery with unstable angina pectoris: Secondary | ICD-10-CM | POA: Diagnosis present

## 2023-01-23 DIAGNOSIS — Z952 Presence of prosthetic heart valve: Secondary | ICD-10-CM

## 2023-01-23 DIAGNOSIS — I2102 ST elevation (STEMI) myocardial infarction involving left anterior descending coronary artery: Secondary | ICD-10-CM

## 2023-01-23 DIAGNOSIS — Y831 Surgical operation with implant of artificial internal device as the cause of abnormal reaction of the patient, or of later complication, without mention of misadventure at the time of the procedure: Secondary | ICD-10-CM | POA: Diagnosis not present

## 2023-01-23 DIAGNOSIS — E876 Hypokalemia: Secondary | ICD-10-CM | POA: Diagnosis present

## 2023-01-23 DIAGNOSIS — I255 Ischemic cardiomyopathy: Secondary | ICD-10-CM | POA: Diagnosis present

## 2023-01-23 DIAGNOSIS — I440 Atrioventricular block, first degree: Secondary | ICD-10-CM | POA: Diagnosis present

## 2023-01-23 DIAGNOSIS — Z83719 Family history of colon polyps, unspecified: Secondary | ICD-10-CM

## 2023-01-23 DIAGNOSIS — I6523 Occlusion and stenosis of bilateral carotid arteries: Secondary | ICD-10-CM | POA: Diagnosis present

## 2023-01-23 DIAGNOSIS — E7841 Elevated Lipoprotein(a): Secondary | ICD-10-CM | POA: Diagnosis not present

## 2023-01-23 DIAGNOSIS — I25118 Atherosclerotic heart disease of native coronary artery with other forms of angina pectoris: Secondary | ICD-10-CM | POA: Diagnosis present

## 2023-01-23 DIAGNOSIS — J9601 Acute respiratory failure with hypoxia: Secondary | ICD-10-CM | POA: Diagnosis not present

## 2023-01-23 DIAGNOSIS — I5021 Acute systolic (congestive) heart failure: Secondary | ICD-10-CM

## 2023-01-23 DIAGNOSIS — Z9221 Personal history of antineoplastic chemotherapy: Secondary | ICD-10-CM

## 2023-01-23 DIAGNOSIS — D638 Anemia in other chronic diseases classified elsewhere: Secondary | ICD-10-CM | POA: Diagnosis present

## 2023-01-23 DIAGNOSIS — T82867A Thrombosis of cardiac prosthetic devices, implants and grafts, initial encounter: Secondary | ICD-10-CM

## 2023-01-23 DIAGNOSIS — I82C12 Acute embolism and thrombosis of left internal jugular vein: Secondary | ICD-10-CM | POA: Diagnosis not present

## 2023-01-23 DIAGNOSIS — R5381 Other malaise: Secondary | ICD-10-CM

## 2023-01-23 DIAGNOSIS — I82612 Acute embolism and thrombosis of superficial veins of left upper extremity: Secondary | ICD-10-CM | POA: Diagnosis not present

## 2023-01-23 DIAGNOSIS — A419 Sepsis, unspecified organism: Secondary | ICD-10-CM | POA: Diagnosis not present

## 2023-01-23 DIAGNOSIS — Z833 Family history of diabetes mellitus: Secondary | ICD-10-CM

## 2023-01-23 DIAGNOSIS — R570 Cardiogenic shock: Secondary | ICD-10-CM | POA: Diagnosis not present

## 2023-01-23 DIAGNOSIS — Z923 Personal history of irradiation: Secondary | ICD-10-CM

## 2023-01-23 DIAGNOSIS — I214 Non-ST elevation (NSTEMI) myocardial infarction: Secondary | ICD-10-CM

## 2023-01-23 DIAGNOSIS — R32 Unspecified urinary incontinence: Secondary | ICD-10-CM | POA: Diagnosis present

## 2023-01-23 DIAGNOSIS — F419 Anxiety disorder, unspecified: Secondary | ICD-10-CM | POA: Diagnosis present

## 2023-01-23 DIAGNOSIS — Z955 Presence of coronary angioplasty implant and graft: Secondary | ICD-10-CM

## 2023-01-23 DIAGNOSIS — R339 Retention of urine, unspecified: Secondary | ICD-10-CM | POA: Diagnosis not present

## 2023-01-23 DIAGNOSIS — R739 Hyperglycemia, unspecified: Secondary | ICD-10-CM | POA: Diagnosis not present

## 2023-01-23 DIAGNOSIS — Z7982 Long term (current) use of aspirin: Secondary | ICD-10-CM

## 2023-01-23 DIAGNOSIS — I509 Heart failure, unspecified: Secondary | ICD-10-CM

## 2023-01-23 DIAGNOSIS — I4729 Other ventricular tachycardia: Secondary | ICD-10-CM | POA: Diagnosis not present

## 2023-01-23 DIAGNOSIS — I82409 Acute embolism and thrombosis of unspecified deep veins of unspecified lower extremity: Secondary | ICD-10-CM

## 2023-01-23 DIAGNOSIS — I2510 Atherosclerotic heart disease of native coronary artery without angina pectoris: Secondary | ICD-10-CM | POA: Diagnosis present

## 2023-01-23 DIAGNOSIS — I10 Essential (primary) hypertension: Secondary | ICD-10-CM | POA: Diagnosis present

## 2023-01-23 DIAGNOSIS — Z9011 Acquired absence of right breast and nipple: Secondary | ICD-10-CM

## 2023-01-23 DIAGNOSIS — I462 Cardiac arrest due to underlying cardiac condition: Secondary | ICD-10-CM | POA: Diagnosis not present

## 2023-01-23 DIAGNOSIS — Z888 Allergy status to other drugs, medicaments and biological substances status: Secondary | ICD-10-CM

## 2023-01-23 DIAGNOSIS — Z853 Personal history of malignant neoplasm of breast: Secondary | ICD-10-CM

## 2023-01-23 DIAGNOSIS — E669 Obesity, unspecified: Secondary | ICD-10-CM | POA: Diagnosis present

## 2023-01-23 DIAGNOSIS — I5020 Unspecified systolic (congestive) heart failure: Secondary | ICD-10-CM

## 2023-01-23 DIAGNOSIS — Z87891 Personal history of nicotine dependence: Secondary | ICD-10-CM

## 2023-01-23 DIAGNOSIS — Z7902 Long term (current) use of antithrombotics/antiplatelets: Secondary | ICD-10-CM

## 2023-01-23 LAB — RESP PANEL BY RT-PCR (RSV, FLU A&B, COVID)  RVPGX2
Influenza A by PCR: NEGATIVE
Influenza B by PCR: NEGATIVE
Resp Syncytial Virus by PCR: NEGATIVE
SARS Coronavirus 2 by RT PCR: NEGATIVE

## 2023-01-23 LAB — CBC WITH DIFFERENTIAL/PLATELET
Abs Immature Granulocytes: 0.03 10*3/uL (ref 0.00–0.07)
Basophils Absolute: 0.1 10*3/uL (ref 0.0–0.1)
Basophils Relative: 1 %
Eosinophils Absolute: 0.1 10*3/uL (ref 0.0–0.5)
Eosinophils Relative: 1 %
HCT: 36.7 % (ref 36.0–46.0)
Hemoglobin: 12.8 g/dL (ref 12.0–15.0)
Immature Granulocytes: 0 %
Lymphocytes Relative: 20 %
Lymphs Abs: 1.4 10*3/uL (ref 0.7–4.0)
MCH: 31.9 pg (ref 26.0–34.0)
MCHC: 34.9 g/dL (ref 30.0–36.0)
MCV: 91.5 fL (ref 80.0–100.0)
Monocytes Absolute: 0.7 10*3/uL (ref 0.1–1.0)
Monocytes Relative: 10 %
Neutro Abs: 5 10*3/uL (ref 1.7–7.7)
Neutrophils Relative %: 68 %
Platelets: 333 10*3/uL (ref 150–400)
RBC: 4.01 MIL/uL (ref 3.87–5.11)
RDW: 13.2 % (ref 11.5–15.5)
WBC: 7.2 10*3/uL (ref 4.0–10.5)
nRBC: 0 % (ref 0.0–0.2)

## 2023-01-23 LAB — COMPREHENSIVE METABOLIC PANEL
ALT: 27 U/L (ref 0–44)
AST: 26 U/L (ref 15–41)
Albumin: 3.9 g/dL (ref 3.5–5.0)
Alkaline Phosphatase: 47 U/L (ref 38–126)
Anion gap: 11 (ref 5–15)
BUN: 16 mg/dL (ref 8–23)
CO2: 26 mmol/L (ref 22–32)
Calcium: 9.7 mg/dL (ref 8.9–10.3)
Chloride: 91 mmol/L — ABNORMAL LOW (ref 98–111)
Creatinine, Ser: 0.78 mg/dL (ref 0.44–1.00)
GFR, Estimated: >60 mL/min (ref >60)
Glucose, Bld: 114 mg/dL — ABNORMAL HIGH (ref 70–99)
Potassium: 3.5 mmol/L (ref 3.5–5.1)
Sodium: 128 mmol/L — ABNORMAL LOW (ref 135–145)
Total Bilirubin: 0.8 mg/dL (ref 0.3–1.2)
Total Protein: 6.5 g/dL (ref 6.5–8.1)

## 2023-01-23 LAB — BRAIN NATRIURETIC PEPTIDE: B Natriuretic Peptide: 462.3 pg/mL — ABNORMAL HIGH (ref 0.0–100.0)

## 2023-01-23 LAB — TROPONIN I (HIGH SENSITIVITY): Troponin I (High Sensitivity): 35 ng/L — ABNORMAL HIGH (ref <18)

## 2023-01-23 NOTE — ED Triage Notes (Signed)
Pt states that she had pneumonia in April and has been on multiple rounds of abx since then with little relief. Put on 2 more abx 3 weeks ago with increased generalized weakness. Pt complains of continued SHOB.

## 2023-01-24 ENCOUNTER — Encounter (HOSPITAL_COMMUNITY): Payer: Self-pay | Admitting: Internal Medicine

## 2023-01-24 DIAGNOSIS — E669 Obesity, unspecified: Secondary | ICD-10-CM | POA: Diagnosis not present

## 2023-01-24 DIAGNOSIS — I110 Hypertensive heart disease with heart failure: Secondary | ICD-10-CM | POA: Diagnosis not present

## 2023-01-24 DIAGNOSIS — I4729 Other ventricular tachycardia: Secondary | ICD-10-CM | POA: Diagnosis not present

## 2023-01-24 DIAGNOSIS — T82855A Stenosis of coronary artery stent, initial encounter: Secondary | ICD-10-CM | POA: Diagnosis not present

## 2023-01-24 DIAGNOSIS — I214 Non-ST elevation (NSTEMI) myocardial infarction: Secondary | ICD-10-CM | POA: Diagnosis present

## 2023-01-24 DIAGNOSIS — E871 Hypo-osmolality and hyponatremia: Secondary | ICD-10-CM | POA: Diagnosis not present

## 2023-01-24 DIAGNOSIS — I5022 Chronic systolic (congestive) heart failure: Secondary | ICD-10-CM | POA: Diagnosis not present

## 2023-01-24 DIAGNOSIS — D75839 Thrombocytosis, unspecified: Secondary | ICD-10-CM | POA: Diagnosis present

## 2023-01-24 DIAGNOSIS — I4721 Torsades de pointes: Secondary | ICD-10-CM | POA: Diagnosis not present

## 2023-01-24 DIAGNOSIS — T82867D Thrombosis of cardiac prosthetic devices, implants and grafts, subsequent encounter: Secondary | ICD-10-CM | POA: Diagnosis not present

## 2023-01-24 DIAGNOSIS — J9691 Respiratory failure, unspecified with hypoxia: Secondary | ICD-10-CM | POA: Diagnosis not present

## 2023-01-24 DIAGNOSIS — I428 Other cardiomyopathies: Secondary | ICD-10-CM | POA: Diagnosis not present

## 2023-01-24 DIAGNOSIS — R570 Cardiogenic shock: Secondary | ICD-10-CM | POA: Diagnosis not present

## 2023-01-24 DIAGNOSIS — D62 Acute posthemorrhagic anemia: Secondary | ICD-10-CM | POA: Diagnosis present

## 2023-01-24 DIAGNOSIS — E7800 Pure hypercholesterolemia, unspecified: Secondary | ICD-10-CM | POA: Diagnosis not present

## 2023-01-24 DIAGNOSIS — I4901 Ventricular fibrillation: Secondary | ICD-10-CM | POA: Diagnosis not present

## 2023-01-24 DIAGNOSIS — I509 Heart failure, unspecified: Secondary | ICD-10-CM | POA: Insufficient documentation

## 2023-01-24 DIAGNOSIS — I5020 Unspecified systolic (congestive) heart failure: Secondary | ICD-10-CM | POA: Diagnosis not present

## 2023-01-24 DIAGNOSIS — D638 Anemia in other chronic diseases classified elsewhere: Secondary | ICD-10-CM | POA: Diagnosis not present

## 2023-01-24 DIAGNOSIS — I5021 Acute systolic (congestive) heart failure: Secondary | ICD-10-CM | POA: Diagnosis not present

## 2023-01-24 DIAGNOSIS — Y831 Surgical operation with implant of artificial internal device as the cause of abnormal reaction of the patient, or of later complication, without mention of misadventure at the time of the procedure: Secondary | ICD-10-CM | POA: Diagnosis not present

## 2023-01-24 DIAGNOSIS — I5043 Acute on chronic combined systolic (congestive) and diastolic (congestive) heart failure: Secondary | ICD-10-CM | POA: Diagnosis not present

## 2023-01-24 DIAGNOSIS — J9601 Acute respiratory failure with hypoxia: Secondary | ICD-10-CM | POA: Diagnosis not present

## 2023-01-24 DIAGNOSIS — F419 Anxiety disorder, unspecified: Secondary | ICD-10-CM | POA: Diagnosis not present

## 2023-01-24 DIAGNOSIS — I6523 Occlusion and stenosis of bilateral carotid arteries: Secondary | ICD-10-CM | POA: Diagnosis not present

## 2023-01-24 DIAGNOSIS — R5381 Other malaise: Secondary | ICD-10-CM | POA: Diagnosis not present

## 2023-01-24 DIAGNOSIS — Z952 Presence of prosthetic heart valve: Secondary | ICD-10-CM | POA: Diagnosis not present

## 2023-01-24 DIAGNOSIS — G4700 Insomnia, unspecified: Secondary | ICD-10-CM | POA: Diagnosis not present

## 2023-01-24 DIAGNOSIS — I5023 Acute on chronic systolic (congestive) heart failure: Secondary | ICD-10-CM | POA: Diagnosis not present

## 2023-01-24 DIAGNOSIS — I952 Hypotension due to drugs: Secondary | ICD-10-CM | POA: Diagnosis present

## 2023-01-24 DIAGNOSIS — F411 Generalized anxiety disorder: Secondary | ICD-10-CM | POA: Diagnosis not present

## 2023-01-24 DIAGNOSIS — I469 Cardiac arrest, cause unspecified: Secondary | ICD-10-CM | POA: Diagnosis not present

## 2023-01-24 DIAGNOSIS — I255 Ischemic cardiomyopathy: Secondary | ICD-10-CM | POA: Diagnosis not present

## 2023-01-24 DIAGNOSIS — I25110 Atherosclerotic heart disease of native coronary artery with unstable angina pectoris: Secondary | ICD-10-CM | POA: Diagnosis not present

## 2023-01-24 DIAGNOSIS — Z953 Presence of xenogenic heart valve: Secondary | ICD-10-CM | POA: Diagnosis not present

## 2023-01-24 DIAGNOSIS — I10 Essential (primary) hypertension: Secondary | ICD-10-CM | POA: Diagnosis not present

## 2023-01-24 DIAGNOSIS — I21A9 Other myocardial infarction type: Secondary | ICD-10-CM | POA: Diagnosis not present

## 2023-01-24 DIAGNOSIS — N179 Acute kidney failure, unspecified: Secondary | ICD-10-CM | POA: Diagnosis not present

## 2023-01-24 DIAGNOSIS — Z87891 Personal history of nicotine dependence: Secondary | ICD-10-CM | POA: Diagnosis not present

## 2023-01-24 DIAGNOSIS — Z1152 Encounter for screening for COVID-19: Secondary | ICD-10-CM | POA: Diagnosis not present

## 2023-01-24 DIAGNOSIS — I779 Disorder of arteries and arterioles, unspecified: Secondary | ICD-10-CM | POA: Diagnosis present

## 2023-01-24 DIAGNOSIS — R339 Retention of urine, unspecified: Secondary | ICD-10-CM | POA: Diagnosis not present

## 2023-01-24 DIAGNOSIS — Z006 Encounter for examination for normal comparison and control in clinical research program: Secondary | ICD-10-CM | POA: Diagnosis not present

## 2023-01-24 DIAGNOSIS — I959 Hypotension, unspecified: Secondary | ICD-10-CM | POA: Diagnosis not present

## 2023-01-24 DIAGNOSIS — I82612 Acute embolism and thrombosis of superficial veins of left upper extremity: Secondary | ICD-10-CM | POA: Diagnosis not present

## 2023-01-24 DIAGNOSIS — F32A Depression, unspecified: Secondary | ICD-10-CM | POA: Diagnosis present

## 2023-01-24 DIAGNOSIS — I350 Nonrheumatic aortic (valve) stenosis: Secondary | ICD-10-CM | POA: Diagnosis not present

## 2023-01-24 DIAGNOSIS — M7989 Other specified soft tissue disorders: Secondary | ICD-10-CM | POA: Diagnosis not present

## 2023-01-24 DIAGNOSIS — E785 Hyperlipidemia, unspecified: Secondary | ICD-10-CM | POA: Diagnosis present

## 2023-01-24 DIAGNOSIS — I472 Ventricular tachycardia, unspecified: Secondary | ICD-10-CM | POA: Diagnosis not present

## 2023-01-24 DIAGNOSIS — J189 Pneumonia, unspecified organism: Secondary | ICD-10-CM | POA: Diagnosis not present

## 2023-01-24 DIAGNOSIS — I2510 Atherosclerotic heart disease of native coronary artery without angina pectoris: Secondary | ICD-10-CM | POA: Diagnosis present

## 2023-01-24 DIAGNOSIS — I82C12 Acute embolism and thrombosis of left internal jugular vein: Secondary | ICD-10-CM | POA: Diagnosis not present

## 2023-01-24 DIAGNOSIS — I462 Cardiac arrest due to underlying cardiac condition: Secondary | ICD-10-CM | POA: Diagnosis not present

## 2023-01-24 DIAGNOSIS — A419 Sepsis, unspecified organism: Secondary | ICD-10-CM | POA: Diagnosis not present

## 2023-01-24 DIAGNOSIS — K5900 Constipation, unspecified: Secondary | ICD-10-CM | POA: Diagnosis present

## 2023-01-24 DIAGNOSIS — I5031 Acute diastolic (congestive) heart failure: Secondary | ICD-10-CM | POA: Diagnosis not present

## 2023-01-24 DIAGNOSIS — R0602 Shortness of breath: Secondary | ICD-10-CM | POA: Diagnosis present

## 2023-01-24 DIAGNOSIS — R609 Edema, unspecified: Secondary | ICD-10-CM | POA: Diagnosis not present

## 2023-01-24 LAB — TROPONIN I (HIGH SENSITIVITY): Troponin I (High Sensitivity): 38 ng/L — ABNORMAL HIGH (ref <18)

## 2023-01-24 MED ORDER — ACETAMINOPHEN 500 MG PO TABS
1000.0000 mg | ORAL_TABLET | Freq: Four times a day (QID) | ORAL | Status: DC | PRN
  Administered 2023-01-24 – 2023-01-26 (×3): 500 mg via ORAL
  Administered 2023-01-27: 1000 mg via ORAL
  Administered 2023-01-27: 500 mg via ORAL
  Administered 2023-01-28: 1000 mg via ORAL
  Filled 2023-01-24 (×6): qty 2

## 2023-01-24 MED ORDER — OMEGA-3-ACID ETHYL ESTERS 1 G PO CAPS
1.0000 g | ORAL_CAPSULE | Freq: Every day | ORAL | Status: DC
  Administered 2023-01-24 – 2023-02-01 (×9): 1 g via ORAL
  Filled 2023-01-24 (×11): qty 1

## 2023-01-24 MED ORDER — FUROSEMIDE 10 MG/ML IJ SOLN
60.0000 mg | Freq: Three times a day (TID) | INTRAMUSCULAR | Status: DC
  Administered 2023-01-24 – 2023-01-25 (×4): 60 mg via INTRAVENOUS
  Filled 2023-01-24 (×4): qty 6

## 2023-01-24 MED ORDER — ACETAMINOPHEN 325 MG PO TABS: 650.0000 mg | ORAL_TABLET | ORAL | Status: DC | PRN

## 2023-01-24 MED ORDER — SODIUM CHLORIDE 0.9 % IV SOLN: 250.0000 mL | INTRAVENOUS | Status: DC | PRN

## 2023-01-24 MED ORDER — FUROSEMIDE 10 MG/ML IJ SOLN
20.0000 mg | Freq: Once | INTRAMUSCULAR | Status: AC
  Administered 2023-01-24: 20 mg via INTRAVENOUS
  Filled 2023-01-24: qty 2

## 2023-01-24 MED ORDER — LOSARTAN POTASSIUM 25 MG PO TABS
25.0000 mg | ORAL_TABLET | Freq: Every day | ORAL | Status: DC
  Administered 2023-01-24 – 2023-01-31 (×8): 25 mg via ORAL
  Filled 2023-01-24 (×8): qty 1

## 2023-01-24 MED ORDER — PANTOPRAZOLE SODIUM 40 MG PO TBEC
40.0000 mg | DELAYED_RELEASE_TABLET | Freq: Every day | ORAL | Status: DC
  Administered 2023-01-24 – 2023-02-02 (×10): 40 mg via ORAL
  Filled 2023-01-24 (×10): qty 1

## 2023-01-24 MED ORDER — ALPRAZOLAM 0.5 MG PO TABS
1.0000 mg | ORAL_TABLET | Freq: Once | ORAL | Status: AC
  Administered 2023-01-24: 1 mg via ORAL
  Filled 2023-01-24: qty 2

## 2023-01-24 MED ORDER — BUSPIRONE HCL 10 MG PO TABS
5.0000 mg | ORAL_TABLET | Freq: Two times a day (BID) | ORAL | Status: DC
  Administered 2023-01-24 – 2023-02-02 (×19): 5 mg via ORAL
  Filled 2023-01-24 (×20): qty 1

## 2023-01-24 MED ORDER — NITROGLYCERIN 0.4 MG SL SUBL
0.4000 mg | SUBLINGUAL_TABLET | SUBLINGUAL | Status: DC | PRN
  Filled 2023-01-24: qty 1

## 2023-01-24 MED ORDER — AMLODIPINE BESYLATE 2.5 MG PO TABS
2.5000 mg | ORAL_TABLET | Freq: Every day | ORAL | Status: DC
  Administered 2023-01-24 – 2023-01-27 (×4): 2.5 mg via ORAL
  Filled 2023-01-24 (×4): qty 1

## 2023-01-24 MED ORDER — SODIUM CHLORIDE 0.9% FLUSH: 3.0000 mL | INTRAVENOUS | Status: DC | PRN

## 2023-01-24 MED ORDER — POTASSIUM CHLORIDE 20 MEQ PO PACK
20.0000 meq | PACK | Freq: Every day | ORAL | Status: DC
  Administered 2023-01-24 – 2023-01-25 (×2): 20 meq via ORAL
  Filled 2023-01-24 (×2): qty 1

## 2023-01-24 MED ORDER — ALPRAZOLAM 0.5 MG PO TABS
1.0000 mg | ORAL_TABLET | Freq: Three times a day (TID) | ORAL | Status: DC
  Administered 2023-01-24 – 2023-01-26 (×9): 1 mg via ORAL
  Filled 2023-01-24 (×9): qty 2

## 2023-01-24 MED ORDER — ENOXAPARIN SODIUM 40 MG/0.4ML IJ SOSY
40.0000 mg | PREFILLED_SYRINGE | INTRAMUSCULAR | Status: DC
  Administered 2023-01-24 – 2023-01-31 (×7): 40 mg via SUBCUTANEOUS
  Filled 2023-01-24 (×8): qty 0.4

## 2023-01-24 MED ORDER — ROSUVASTATIN CALCIUM 20 MG PO TABS
20.0000 mg | ORAL_TABLET | Freq: Every day | ORAL | Status: DC
  Administered 2023-02-02 – 2023-02-06 (×4): 20 mg via ORAL
  Filled 2023-01-24 (×10): qty 1

## 2023-01-24 MED ORDER — ONDANSETRON HCL 4 MG/2ML IJ SOLN: 4.0000 mg | Freq: Four times a day (QID) | INTRAMUSCULAR | Status: DC | PRN

## 2023-01-24 MED ORDER — SODIUM CHLORIDE 0.9% FLUSH
3.0000 mL | Freq: Two times a day (BID) | INTRAVENOUS | Status: DC
  Administered 2023-01-24 – 2023-02-09 (×23): 3 mL via INTRAVENOUS
  Administered 2023-02-10: 10 mL via INTRAVENOUS
  Administered 2023-02-10: 3 mL via INTRAVENOUS
  Administered 2023-02-11: 10 mL via INTRAVENOUS
  Administered 2023-02-12 – 2023-02-18 (×7): 3 mL via INTRAVENOUS

## 2023-01-24 MED ORDER — IOHEXOL 350 MG/ML SOLN
75.0000 mL | Freq: Once | INTRAVENOUS | Status: AC | PRN
  Administered 2023-01-24: 75 mL via INTRAVENOUS

## 2023-01-24 MED ORDER — ASPIRIN 81 MG PO CHEW
81.0000 mg | CHEWABLE_TABLET | Freq: Every day | ORAL | Status: DC
  Administered 2023-01-24 – 2023-02-02 (×9): 81 mg via ORAL
  Filled 2023-01-24 (×10): qty 1

## 2023-01-24 NOTE — Subjective & Objective (Addendum)
Madison West, a 67 y/o with a medical  history of PSVT, anxiety/depression, breast cancer status post lumpectomy, chemo and radiation in 2009, hypertension, hyperlipidemia, right carotid artery stenosis status post carotid endarterectomy in 2020, CAD status post stenting, mild to moderate aortic stenosis, GERD presented to the ED with complaints of shortness of breath, weight gain, and generalized weakness.  Slightly tachycardic on arrival but afebrile.  Not hypotensive.  Satting in the low 90s on room air.  Labs significant for sodium 128, chloride 91, creatinine 0.7, BNP 462, troponin 35> 38, COVID/influenza/RSV PCR negative. Patient with active anxiety. She has been non-adherent to medical recommendations and is not taking medications as prescribed due to fear of drugs.

## 2023-01-24 NOTE — Assessment & Plan Note (Deleted)
Last lipid panel in Feb '24 with total Cholesterol 220, HDL 51, LD 146. Target LDL for patient with ASVD <16.  Plan Discontinue red yeast rice  Crestor daily - educated patient as to the critical importance of lowering LDL in patient with CAD and Carotid disease.

## 2023-01-24 NOTE — ED Notes (Signed)
[facility] has arrived to transport pt to [facility] 6E06, receiving RN Randalyn Rhea notified of [facility] arrival and pt to arrive at [facility] shortly. NAD noted, VSS, pt denies pain or discomfort at current. A&O x4.

## 2023-01-24 NOTE — Assessment & Plan Note (Deleted)
Patient with active CAD with PCI/Stent x 2, known obstructive disease circ. She has had recent cardiac testing under direction of Dr. Jacinto Halim - w/o acute disease. Her last Echo 05/25/22 with grade I DD and EF 55-60%. No prior h/o CHF. Now presenting with a several month h/o progressive SOB/DOE, fatigue, weight gain with fluid retention. She has been treated x 3 since April for PNA with no residual infiltrates on CXR or CTA chest. BNP mildly elevated. Troponin x 2 at 35, 38 c/w strain w/o acute changes on EKG  Plan Cardiac tele obs admit  IV lasix 60 mg TID x 6 doses  Continue ARB, no BB at this time - may add later in admission or defer to Dr. Jacinto Halim as outpatient  ECHO

## 2023-01-24 NOTE — Progress Notes (Signed)
Reattempted to give rosuvastatin, Pt still refuses to take the medication. Education was given.

## 2023-01-24 NOTE — ED Provider Notes (Signed)
Emergency Department Provider Note   I have reviewed the triage vital signs and the nursing notes.   HISTORY  Chief Complaint Shortness of Breath   HPI Madison West is a 67 y.o. female past history reviewed below presents to the emergency department with continued weakness, shortness of breath, unintentional weight gain.  She has had several weeks to months of symptoms.  She states that she has been to urgent care and other providers who have treated her for possible pneumonia.  She was not experiencing fevers or bodyaches.  No productive cough.  She states that she mainly has shortness of breath which is worse with exertion and significant weakness. No vomiting or diarrhea.  She does live with a smoker and has a prior smoking history herself but no diagnosis of COPD or asthma at this point.  She has followed with her cardiologist, Dr. Nadara Eaton, as she will sometimes have discomfort in her chest radiating to both shoulders.  She has had nuclear medicine scans and echo in September 2023.  She has not yet been referred to a pulmonologist for evaluation.  She has been reluctant to try steroids or MDI albuterol due to feeling "hyped up" on these medications in the past.   She presented today for worsening symptoms.  She states her weight has increased by at least 5 pounds in the last 1 to 2 weeks.  She has not noticed significant swelling in her legs although has noticed that her abdomen seems much more distended and full but is not having pain.    Past Medical History:  Diagnosis Date   Anxiety    Arthritis    low back and hip pain intermittent   Breast cancer (HCC) 06/02/07   r breast -surgery ,radiaology. chemotherapy   Carpal tunnel syndrome    right hand   Colon polyps    Complication of anesthesia    Fentanyl, Versed-makes extra hyper, bradycardia x 1 in PACU, [facility] (08/15/11 cardiology felt neostigmine may have resulted in AV nodal block)    Coronary artery disease     Depression    denies   Dysplasia of vulva    Hypertension    Palpitations    PSVT, s/p adenosine 08/04/16   S/P breast lumpectomy 07/04/07   R breast   S/P radiation therapy 2009    Review of Systems  Constitutional: No fever/chills Cardiovascular: Intermittent chest pain. Respiratory: Positive shortness of breath. Gastrointestinal: No abdominal pain.  No nausea, no vomiting.   Musculoskeletal: Negative for back pain. Skin: Negative for rash. Neurological: Negative for headaches.  ____________________________________________   PHYSICAL EXAM:  VITAL SIGNS: ED Triage Vitals  Encounter Vitals Group     BP 01/23/23 2158 (!) 144/90     Pulse Rate 01/23/23 2158 (!) 101     Resp 01/23/23 2158 20     Temp 01/23/23 2158 99.3 F (37.4 C)     Temp Source 01/23/23 2158 Oral     SpO2 01/23/23 2158 98 %     Weight 01/23/23 2155 210 lb (95.3 kg)     Height 01/23/23 2155 5\' 8"  (1.727 m)   Constitutional: Alert and oriented. Well appearing and in no acute distress. Eyes: Conjunctivae are normal. Head: Atraumatic. Nose: No congestion/rhinnorhea. Mouth/Throat: Mucous membranes are moist.  Neck: No stridor.   Cardiovascular: Normal rate, regular rhythm. Good peripheral circulation. Grossly normal heart sounds.   Respiratory: Normal respiratory effort.  No retractions. Lungs with crackles at the bases.  Gastrointestinal: Soft and  nontender. No distention.  Musculoskeletal: No lower extremity tenderness nor edema. No gross deformities of extremities. Neurologic:  Normal speech and language.  Skin:  Skin is warm, dry and intact. No rash noted.  ____________________________________________   LABS (all labs ordered are listed, but only abnormal results are displayed)  Labs Reviewed  COMPREHENSIVE METABOLIC PANEL - Abnormal; Notable for the following components:      Result Value   Sodium 128 (*)    Chloride 91 (*)    Glucose, Bld 114 (*)    All other components within normal  limits  BRAIN NATRIURETIC PEPTIDE - Abnormal; Notable for the following components:   B Natriuretic Peptide 462.3 (*)    All other components within normal limits  TROPONIN I (HIGH SENSITIVITY) - Abnormal; Notable for the following components:   Troponin I (High Sensitivity) 35 (*)    All other components within normal limits  TROPONIN I (HIGH SENSITIVITY) - Abnormal; Notable for the following components:   Troponin I (High Sensitivity) 38 (*)    All other components within normal limits  RESP PANEL BY RT-PCR (RSV, FLU A&B, COVID)  [id number]  CBC WITH DIFFERENTIAL/PLATELET   ____________________________________________  EKG   EKG Interpretation Date/Time:  Saturday January 23 2023 22:01:35 EDT Ventricular Rate:  97 PR Interval:  200 QRS Duration:  88 QT Interval:  359 QTC Calculation: 456 R Axis:   -42  Text Interpretation: Sinus tachycardia Ventricular premature complex LAE, consider biatrial enlargement Left anterior fascicular block Abnormal R-wave progression, early transition Borderline repolarization abnormality ST elevation, consider inferior injury Confirmed by Virgina Norfolk [phone]) on 01/23/2023 10:12:00 PM        ____________________________________________  RADIOLOGY  CT Angio Chest PE W and/or Wo Contrast  Result Date: 01/24/2023 CLINICAL DATA:  Pulmonary embolism suspected, high probability. EXAM: CT ANGIOGRAPHY CHEST WITH CONTRAST TECHNIQUE: Multidetector CT imaging of the chest was performed using the standard protocol during bolus administration of intravenous contrast. Multiplanar CT image reconstructions and MIPs were obtained to evaluate the vascular anatomy. RADIATION DOSE REDUCTION: This exam was performed according to the departmental dose-optimization program which includes automated exposure control, adjustment of the mA and/or kV according to patient size and/or use of iterative reconstruction technique. CONTRAST:  75mL OMNIPAQUE IOHEXOL 350 MG/ML SOLN  COMPARISON:  10/16/2022. FINDINGS: Cardiovascular: Heart is normal in size and there is a trace pericardial effusion. Multi-vessel coronary artery calcifications are noted. There is atherosclerotic calcification of the aorta without evidence of aneurysm. The pulmonary trunk is normal in caliber. No evidence of pulmonary embolism. Mediastinum/Nodes: Enlarged lymph nodes are present in the mediastinum measuring up to 1.5 cm in the subcarinal space. Enlarged hilar lymph nodes are present bilaterally measuring up to 1.4 cm on the right. No axillary lymphadenopathy. Surgical clips are present in the right axilla. The thyroid gland, trachea, and esophagus are within normal limits. Lungs/Pleura: Mild centrilobular emphysematous changes are present in the lungs. Bronchial wall thickening and atelectasis are present bilaterally. No effusion or pneumothorax. Upper Abdomen: There are bilateral adrenal nodules, likely adenomas as seen on multiple prior exams. No acute abnormality. Musculoskeletal: Surgical changes are present in the right breast. Degenerative changes are present in the thoracic spine. A stable compression deformity is noted in the superior endplate at L1. Review of the MIP images confirms the above findings. IMPRESSION: 1. No evidence of pulmonary embolism. 2. Mild bronchial wall thickening bilaterally with atelectasis, which may be infectious or inflammatory. 3. Nonspecific mediastinal and hilar lymphadenopathy, possibly reactive. Emphysema. 4. Coronary  artery calcifications. 5. Aortic atherosclerosis. Electronically Signed   By: Thornell Sartorius M.D.   On: 01/24/2023 00:39   DG Chest 2 View  Result Date: 01/23/2023 CLINICAL DATA:  Cough. EXAM: CHEST - 2 VIEW COMPARISON:  Radiograph 01/11/2019 FINDINGS: The heart is normal in size. Atherosclerosis of the thoracic aorta. Mild peribronchial thickening without focal airspace disease. No pleural effusion or pneumothorax. No pulmonary edema. Right axillary surgical  clips. No acute osseous findings. IMPRESSION: Mild peribronchial thickening without focal airspace disease. Electronically Signed   By: Narda Rutherford M.D.   On: 01/23/2023 22:19    ____________________________________________   PROCEDURES  Procedure(s) performed:   Procedures  None  ____________________________________________   INITIAL IMPRESSION / ASSESSMENT AND PLAN / ED COURSE  Pertinent labs & imaging results that were available during my care of the patient were reviewed by me and considered in my medical decision making (see chart for details).   This patient is Presenting for Evaluation of SOB/CP, which does require a range of treatment options, and is a complaint that involves a high risk of morbidity and mortality.  The Differential Diagnoses includes but is not exclusive to acute coronary syndrome, aortic dissection, pulmonary embolism, cardiac tamponade, community-acquired pneumonia, pericarditis, musculoskeletal chest wall pain, etc.   Critical Interventions-    Medications  iohexol (OMNIPAQUE) 350 MG/ML injection 75 mL (75 mLs Intravenous Contrast Given 01/24/23 0017)  furosemide (LASIX) injection 20 mg (20 mg Intravenous Given 01/24/23 0117)  ALPRAZolam (XANAX) tablet 1 mg (1 mg Oral Given 01/24/23 0117)    Reassessment after intervention: beginning to diurese.   I decided to review pertinent External Data, and in summary patient followed by [facility]. Last ECHO from 02/2022 shows mild AS and no systolic CHF.   Clinical Laboratory Tests Ordered, included troponins mildly elevated to 35 and 38.  BNP of 462.  COVID-negative.  No acute kidney injury or severe anemia on CBC.  Radiologic Tests Ordered, included CXR and CTA PE. I independently interpreted the images and agree with radiology interpretation.   Cardiac Monitor Tracing which shows NSR.    Social Determinants of Health Risk patient is a former smoker and exposed to second-hand smoke currently.    Consult complete with [facility], Dr. Loney Loh. Plan for admit.   Medical Decision Making: Summary:  Patient presents emergency department with shortness of breath and intermittent chest pain which has been ongoing now for several months.  Symptoms are progressively worsening.  She has unintentional weight gain, abdominal distention.  Her legs do not appear particularly swollen or edematous although I wonder if she is holding onto some fluid in her abdomen.  Her BNP is elevated along with mild elevation in her troponins.  No hypoxemia but some increased work of breathing with exertion.  She may benefit from diuresis and consideration of repeat echo from the primary team.  I do not appreciate significant wheezing although she may have some undiagnosed emphysema given smoking history and smoke exposure at home. Would no doubt benefit from at least outpatient referral to Pulmonology.   Reevaluation with update and discussion with patient. Plan for diuresis and admit given progressing symptoms and abnormal labs. Patient is in agreement.   Patient's presentation is most consistent with acute presentation with potential threat to life or bodily function.   Disposition: admit  ____________________________________________  FINAL CLINICAL IMPRESSION(S) / ED DIAGNOSES  Final diagnoses:  SOB (shortness of breath)    Note:  This document was prepared using Dragon voice recognition software and  may include unintentional dictation errors.  Alona Bene, MD, [facility]    , Arlyss Repress, MD 01/24/23 [phone]

## 2023-01-24 NOTE — ED Notes (Signed)
ED TO INPATIENT HANDOFF REPORT  ED Nurse Name and Phone #: Elnita Maxwell [phone]    S Name/Age/Gender [name] 67 y.o. female Room/Bed: MH01/MH01  Code Status   Code Status: Prior  Home/SNF/Other Home Patient oriented to: self, place, time, and situation Is this baseline? Yes   Triage Complete: Triage complete  Chief Complaint Acute CHF (congestive heart failure) (HCC) [I50.9]  Triage Note Pt states that she had pneumonia in April and has been on multiple rounds of abx since then with little relief. Put on 2 more abx 3 weeks ago with increased generalized weakness. Pt complains of continued SHOB.   Allergies Allergies  Allergen Reactions   Prednisone Palpitations   Fentanyl Other (See Comments) and Swelling    MAKES SUPER HYPER PER PT  Makes pt "hyper" feels she is "climbing the walls."  Last time she had a colonoscopy with Fentanyl and Versed she was awake the whole time.   Hydrocodone-Acetaminophen Swelling    Wakes up in a "panic" if takes this, ok with oxycodone   Midazolam Anxiety and Swelling    Makes pt "hyper" feels she is "climbing the walls."  Last time she had a colonoscopy with Fentanyl and Versed she was awake the whole time.   Vicodin [Hydrocodone-Acetaminophen] Anxiety    Extreme anxiety.    Level of Care/Admitting Diagnosis ED Disposition     ED Disposition  Admit   Condition  --   Comment  Hospital Area: [facility] [[id number]]  Level of Care: Telemetry Cardiac [[id number]]  Interfacility transfer: Yes  May place patient in observation at [facility] or [facility] if equivalent level of care is available:: Yes  Covid Evaluation: Asymptomatic - no recent exposure (last 10 days) testing not required  Diagnosis: Acute CHF (congestive heart failure) [facility]) [[id number]]  Admitting Physician: Magnus Ivan [[id number]]  Attending Physician: Maia Plan [[id number]]          B Medical/Surgery History Past Medical History:   Diagnosis Date   Anxiety    Arthritis    low back and hip pain intermittent   Breast cancer (HCC) 06/02/07   r breast -surgery ,radiaology. chemotherapy   Carpal tunnel syndrome    right hand   Colon polyps    Complication of anesthesia    Fentanyl, Versed-makes extra hyper, bradycardia x 1 in PACU, [facility] (08/15/11 cardiology felt neostigmine may have resulted in AV nodal block)    Coronary artery disease    Depression    denies   Dysplasia of vulva    Hypertension    Palpitations    PSVT, s/p adenosine 08/04/16   S/P breast lumpectomy 07/04/07   R breast   S/P radiation therapy 2009   Past Surgical History:  Procedure Laterality Date   APPENDECTOMY     age 68   CESAREAN SECTION     x 2   COLONOSCOPY W/ POLYPECTOMY     COLONOSCOPY WITH PROPOFOL N/A 04/27/2016   Procedure: COLONOSCOPY WITH PROPOFOL;  Surgeon: Charolett Bumpers, MD;  Location: [facility] ENDOSCOPY;  Service: Endoscopy;  Laterality: N/A;   DILATION AND CURETTAGE OF UTERUS     multiple   ENDARTERECTOMY Right 06/09/2019   ENDARTERECTOMY Right 06/09/2019   Procedure: ENDARTERECTOMY CAROTID RIGHT;  Surgeon: Sherren Kerns, MD;  Location: [facility] OR;  Service: Vascular;  Laterality: Right;   INTRAUTERINE DEVICE INSERTION     IUD REMOVAL     LEFT HEART CATH AND CORONARY ANGIOGRAPHY N/A 02/27/2020   Procedure:  LEFT HEART CATH AND CORONARY ANGIOGRAPHY;  Surgeon: Yates Decamp, MD;  Location: [facility] INVASIVE CV LAB;  Service: Cardiovascular;  Laterality: N/A;   MASTECTOMY PARTIAL / LUMPECTOMY W/ AXILLARY LYMPHADENECTOMY Right    lumpectomy and lymph nodes removed   PATCH ANGIOPLASTY Right 06/09/2019   Procedure: Patch Angioplasty of right carotid artery using hemashield paltinum finesse patch;  Surgeon: Sherren Kerns, MD;  Location: [facility] OR;  Service: Vascular;  Laterality: Right;   TUBAL LIGATION     VULVA SURGERY     Multiple times for dysplasia     A IV Location/Drains/Wounds Patient Lines/Drains/Airways Status     Active  Line/Drains/Airways     Name Placement date Placement time Site Days   Peripheral IV 01/23/23 18 G Anterior;Left;Proximal Forearm 01/23/23  2319  Forearm  1            Intake/Output Last 24 hours No intake or output data in the 24 hours ending 01/24/23 0210  Labs/Imaging Results for orders placed or performed during the hospital encounter of 01/23/23 (from the past 48 hour(s))  CBC with Differential     Status: None   Collection Time: 01/23/23 10:08 PM  Result Value Ref Range   WBC 7.2 4.0 - 10.5 K/uL   RBC 4.01 3.87 - 5.11 MIL/uL   Hemoglobin 12.8 12.0 - 15.0 g/dL   HCT 04.5 40.9 - 81.1 %   MCV 91.5 80.0 - 100.0 fL   MCH 31.9 26.0 - 34.0 pg   MCHC 34.9 30.0 - 36.0 g/dL   RDW 91.4 78.2 - 95.6 %   Platelets 333 150 - 400 K/uL   nRBC 0.0 0.0 - 0.2 %   Neutrophils Relative % 68 %   Neutro Abs 5.0 1.7 - 7.7 K/uL   Lymphocytes Relative 20 %   Lymphs Abs 1.4 0.7 - 4.0 K/uL   Monocytes Relative 10 %   Monocytes Absolute 0.7 0.1 - 1.0 K/uL   Eosinophils Relative 1 %   Eosinophils Absolute 0.1 0.0 - 0.5 K/uL   Basophils Relative 1 %   Basophils Absolute 0.1 0.0 - 0.1 K/uL   Immature Granulocytes 0 %   Abs Immature Granulocytes 0.03 0.00 - 0.07 K/uL    Comment: Performed at [facility], [address] [place], [place] [address]  Comprehensive metabolic panel     Status: Abnormal   Collection Time: 01/23/23 10:08 PM  Result Value Ref Range   Sodium 128 (L) 135 - 145 mmol/L   Potassium 3.5 3.5 - 5.1 mmol/L   Chloride 91 (L) 98 - 111 mmol/L   CO2 26 22 - 32 mmol/L   Glucose, Bld 114 (H) 70 - 99 mg/dL    Comment: Glucose reference range applies only to samples taken after fasting for at least 8 hours.   BUN 16 8 - 23 mg/dL   Creatinine, Ser 6.57 0.44 - 1.00 mg/dL   Calcium 9.7 8.9 - 84.6 mg/dL   Total Protein 6.5 6.5 - 8.1 g/dL   Albumin 3.9 3.5 - 5.0 g/dL   AST 26 15 - 41 U/L   ALT 27 0 - 44 U/L   Alkaline Phosphatase 47 38 - 126 U/L   Total Bilirubin 0.8  0.3 - 1.2 mg/dL   GFR, Estimated >96 >29 mL/min    Comment: (NOTE) Calculated using the CKD-EPI Creatinine Equation (2021)    Anion gap 11 5 - 15    Comment: Performed at [facility], [address] [place], [place] [address]  Brain natriuretic peptide     Status: Abnormal   Collection Time: 01/23/23 10:08 PM  Result Value Ref Range   B Natriuretic Peptide 462.3 (H) 0.0 - 100.0 pg/mL    Comment: Performed at [facility], [address] [place], [place] [address]  Troponin I (High Sensitivity)     Status: Abnormal   Collection Time: 01/23/23 10:08 PM  Result Value Ref Range   Troponin I (High Sensitivity) 35 (H) <18 ng/L    Comment: (NOTE) Elevated high sensitivity troponin I (hsTnI) values and significant  changes across serial measurements may suggest ACS but many other  chronic and acute conditions are known to elevate hsTnI results.  Refer to the "Links" section for chest pain algorithms and additional  guidance. Performed at [facility], [address] [place], [place] [address]   Resp panel by RT-PCR (RSV, Flu A&B, Covid) Anterior Nasal Swab     Status: None   Collection Time: 01/23/23 10:08 PM   Specimen: Anterior Nasal Swab  Result Value Ref Range   SARS Coronavirus 2 by RT PCR NEGATIVE NEGATIVE    Comment: (NOTE) SARS-CoV-2 target nucleic acids are NOT DETECTED.  The SARS-CoV-2 RNA is generally detectable in upper respiratory specimens during the acute phase of infection. The lowest concentration of SARS-CoV-2 viral copies this assay can detect is 138 copies/mL. A negative result does not preclude SARS-Cov-2 infection and should not be used as the sole basis for treatment or other patient management decisions. A negative result may occur with  improper specimen collection/handling, submission of specimen other than nasopharyngeal swab, presence of viral mutation(s) within the areas targeted by this assay, and inadequate number of  viral copies(<138 copies/mL). A negative result must be combined with clinical observations, patient history, and epidemiological information. The expected result is Negative.  Fact Sheet for Patients:  BloggerCourse.com  Fact Sheet for Healthcare Providers:  SeriousBroker.it  This test is no t yet approved or cleared by the [place] FDA and  has been authorized for detection and/or diagnosis of SARS-CoV-2 by FDA under an Emergency Use Authorization (EUA). This EUA will remain  in effect (meaning this test can be used) for the duration of the COVID-19 declaration under Section 564(b)(1) of the Act, 21 U.S.C.section 360bbb-3(b)(1), unless the authorization is terminated  or revoked sooner.       Influenza A by PCR NEGATIVE NEGATIVE   Influenza B by PCR NEGATIVE NEGATIVE    Comment: (NOTE) The Xpert Xpress SARS-CoV-2/FLU/RSV plus assay is intended as an aid in the diagnosis of influenza from Nasopharyngeal swab specimens and should not be used as a sole basis for treatment. Nasal washings and aspirates are unacceptable for Xpert Xpress SARS-CoV-2/FLU/RSV testing.  Fact Sheet for Patients: BloggerCourse.com  Fact Sheet for Healthcare Providers: SeriousBroker.it  This test is not yet approved or cleared by the [place] FDA and has been authorized for detection and/or diagnosis of SARS-CoV-2 by FDA under an Emergency Use Authorization (EUA). This EUA will remain in effect (meaning this test can be used) for the duration of the COVID-19 declaration under Section 564(b)(1) of the Act, 21 U.S.C. section 360bbb-3(b)(1), unless the authorization is terminated or revoked.     Resp Syncytial Virus by PCR NEGATIVE NEGATIVE    Comment: (NOTE) Fact Sheet for Patients: BloggerCourse.com  Fact Sheet for Healthcare  Providers: SeriousBroker.it  This test is not yet approved or cleared by the [place] FDA  and has been authorized for detection and/or diagnosis of SARS-CoV-2 by FDA under an Emergency Use Authorization (EUA). This EUA will remain in effect (meaning this test can be used) for the duration of the COVID-19 declaration under Section 564(b)(1) of the Act, 21 U.S.C. section 360bbb-3(b)(1), unless the authorization is terminated or revoked.  Performed at [facility], [address] [place], [place] [address]   Troponin I (High Sensitivity)     Status: Abnormal   Collection Time: 01/23/23 11:37 PM  Result Value Ref Range   Troponin I (High Sensitivity) 38 (H) <18 ng/L    Comment: (NOTE) Elevated high sensitivity troponin I (hsTnI) values and significant  changes across serial measurements may suggest ACS but many other  chronic and acute conditions are known to elevate hsTnI results.  Refer to the "Links" section for chest pain algorithms and additional  guidance. Performed at [facility], [address], [place], [place] [address]    CT Angio Chest PE W and/or Wo Contrast  Result Date: 01/24/2023 CLINICAL DATA:  Pulmonary embolism suspected, high probability. EXAM: CT ANGIOGRAPHY CHEST WITH CONTRAST TECHNIQUE: Multidetector CT imaging of the chest was performed using the standard protocol during bolus administration of intravenous contrast. Multiplanar CT image reconstructions and MIPs were obtained to evaluate the vascular anatomy. RADIATION DOSE REDUCTION: This exam was performed according to the departmental dose-optimization program which includes automated exposure control, adjustment of the mA and/or kV according to patient size and/or use of iterative reconstruction technique. CONTRAST:  75mL OMNIPAQUE IOHEXOL 350 MG/ML SOLN COMPARISON:  10/16/2022. FINDINGS: Cardiovascular: Heart is normal in size and there is a trace pericardial  effusion. Multi-vessel coronary artery calcifications are noted. There is atherosclerotic calcification of the aorta without evidence of aneurysm. The pulmonary trunk is normal in caliber. No evidence of pulmonary embolism. Mediastinum/Nodes: Enlarged lymph nodes are present in the mediastinum measuring up to 1.5 cm in the subcarinal space. Enlarged hilar lymph nodes are present bilaterally measuring up to 1.4 cm on the right. No axillary lymphadenopathy. Surgical clips are present in the right axilla. The thyroid gland, trachea, and esophagus are within normal limits. Lungs/Pleura: Mild centrilobular emphysematous changes are present in the lungs. Bronchial wall thickening and atelectasis are present bilaterally. No effusion or pneumothorax. Upper Abdomen: There are bilateral adrenal nodules, likely adenomas as seen on multiple prior exams. No acute abnormality. Musculoskeletal: Surgical changes are present in the right breast. Degenerative changes are present in the thoracic spine. A stable compression deformity is noted in the superior endplate at L1. Review of the MIP images confirms the above findings. IMPRESSION: 1. No evidence of pulmonary embolism. 2. Mild bronchial wall thickening bilaterally with atelectasis, which may be infectious or inflammatory. 3. Nonspecific mediastinal and hilar lymphadenopathy, possibly reactive. Emphysema. 4. Coronary artery calcifications. 5. Aortic atherosclerosis. Electronically Signed   By: Thornell Sartorius M.D.   On: 01/24/2023 00:39   DG Chest 2 View  Result Date: 01/23/2023 CLINICAL DATA:  Cough. EXAM: CHEST - 2 VIEW COMPARISON:  Radiograph 01/11/2019 FINDINGS: The heart is normal in size. Atherosclerosis of the thoracic aorta. Mild peribronchial thickening without focal airspace disease. No pleural effusion or pneumothorax. No pulmonary edema. Right axillary surgical clips. No acute osseous findings. IMPRESSION: Mild peribronchial thickening without focal airspace disease.  Electronically Signed   By: Narda Rutherford M.D.   On: 01/23/2023 22:19    Pending Labs Unresulted Labs (From admission, onward)    None  Vitals/Pain Today's Vitals   01/24/23 0102 01/24/23 0130 01/24/23 0144 01/24/23 0200  BP:    117/73  Pulse:  83  83  Resp:  17  14  Temp: 98.6 F (37 C)     TempSrc: Oral     SpO2:  96%  94%  Weight:      Height:      PainSc:   0-No pain     Isolation Precautions No active isolations  Medications Medications  iohexol (OMNIPAQUE) 350 MG/ML injection 75 mL (75 mLs Intravenous Contrast Given 01/24/23 0017)  furosemide (LASIX) injection 20 mg (20 mg Intravenous Given 01/24/23 0117)  ALPRAZolam (XANAX) tablet 1 mg (1 mg Oral Given 01/24/23 0117)    Mobility walks     Focused Assessments Cardiac Assessment Handoff:  Cardiac Rhythm: Normal sinus rhythm Lab Results  Component Value Date   TROPONINI <0.03 01/18/2018   Lab Results  Component Value Date   DDIMER 0.27 01/18/2018   Does the Patient currently have chest pain? No    R Recommendations: See Admitting Provider Note   Additional Notes: A&O x4, ambulatory, former smoker who now appears to have emphysema and acute CHF, BNP 462.3. Pt s/o SOB and congested cough, lungs clear, appears to have dry cough upon assessment. NAD noted, VSS, pt denies pain or discomfort. Pt is anxious, med given.

## 2023-01-24 NOTE — ED Notes (Signed)
Patient transported to CT 

## 2023-01-24 NOTE — Assessment & Plan Note (Deleted)
BP controlled but optimally. She does take Losartan/Hct - but not full dose.  Plan Losartan 25 mg daily while inpatient and on IV lasix  Continue amolodipine - which she hasn't been taking as prescribed.

## 2023-01-24 NOTE — Progress Notes (Signed)
Plan of Care Note for accepted transfer   Patient: Madison West MRN: [id number]   DOA: 01/23/2023  Facility requesting transfer: [facility] Requesting Provider: Dr. Jacqulyn Bath Facility course: 67 year old female with history of PSVT, anxiety/depression, breast cancer status post lumpectomy, chemo and radiation in 2009, hypertension, hyperlipidemia, right carotid artery stenosis status post carotid endarterectomy in 2020, CAD status post stenting, mild to moderate aortic stenosis, GERD presented to the ED with complaints of shortness of breath, weight gain, and generalized weakness.  Slightly tachycardic on arrival but afebrile.  Not hypotensive.  Satting in the low 90s on room air.  Labs significant for sodium 128, chloride 91, creatinine 0.7, BNP 462, troponin 35> 38, COVID/influenza/RSV PCR negative.  CTA chest showing: "IMPRESSION: 1. No evidence of pulmonary embolism. 2. Mild bronchial wall thickening bilaterally with atelectasis, which may be infectious or inflammatory. 3. Nonspecific mediastinal and hilar lymphadenopathy, possibly reactive. Emphysema. 4. Coronary artery calcifications. 5. Aortic atherosclerosis."   Patient was given Xanax and IV Lasix 20 mg.  Plan of care: The patient is accepted for admission to Telemetry unit, at [facility].   [facility] will assume care on arrival to accepting facility. Until arrival, care as per EDP. However, [facility] available 24/7 for questions and assistance.  Author: John Giovanni, MD 01/24/2023  Check [email or web address] for on-call coverage.  Nursing staff, Please call [facility] Admits & Consults System-Wide number on Amion as soon as patient's arrival, so appropriate admitting provider can evaluate the pt.

## 2023-01-24 NOTE — Assessment & Plan Note (Deleted)
Long standing problem being treated by patient directed Xanax. She is followed by psychiatry - [facility]) - zoom calls for med check. Buspar has been prescribed which she hasn't taken. On interview she has pressured speech and anxiety.  Plan BuSpar  Continue Xanax with taper as BuSpar takes affect  Outpatient f/o psychiatry

## 2023-01-24 NOTE — H&P (Signed)
History and Physical    Madison West GNF:[id number] DOB: 10-15-1955 DOA: 01/23/2023  DOS: the patient was seen and examined on 01/23/2023  PCP: [name], MD   Patient coming from:  transfer from [facility]  I have personally briefly reviewed patient's old medical records in [facility]  Mrs. Jobst, a 67 y/o with a medical  history of PSVT, anxiety/depression, breast cancer status post lumpectomy, chemo and radiation in 2009, hypertension, hyperlipidemia, right carotid artery stenosis status post carotid endarterectomy in 2020, CAD status post stenting, mild to moderate aortic stenosis, GERD presented to the ED with complaints of shortness of breath, weight gain, and generalized weakness.  Slightly tachycardic on arrival but afebrile.  Not hypotensive.  Satting in the low 90s on room air.  Labs significant for sodium 128, chloride 91, creatinine 0.7, BNP 462, troponin 35> 38, COVID/influenza/RSV PCR negative. Patient with active anxiety. She has been non-adherent to medical recommendations and is not taking medications as prescribed due to fear of drugs.    ED Course: T 99  132/88  HR 87  RR 16. Overweight woman who is anxious but in no acute distress. Lab: Na 128, BNP 462, Troponin  35 to 38, CBCD nl. CTA chest - no PE, bronchial wall thickening, emphysema, CAD, no pulmonary edema. CXR - NAD, EKG sinust tack PVC, LAE, LAFB minimal ST elevation. TRH called to admit patient for management of mild CHF.   Review of Systems:  Review of Systems  Constitutional:  Positive for malaise/fatigue. Negative for chills, fever and weight loss.  HENT: Negative.    Eyes: Negative.   Respiratory:  Positive for cough and shortness of breath. Negative for sputum production and wheezing.   Cardiovascular:  Positive for chest pain and leg swelling. Negative for palpitations, orthopnea and claudication.  Gastrointestinal: Negative.   Genitourinary: Negative.   Musculoskeletal: Negative.    Skin: Negative.   Neurological:  Positive for weakness.  Endo/Heme/Allergies: Negative.   Psychiatric/Behavioral:  The patient is nervous/anxious.     Past Medical History:  Diagnosis Date   Anxiety    Arthritis    low back and hip pain intermittent   Breast cancer (HCC) 06/02/07   r breast -surgery ,radiaology. chemotherapy   Carpal tunnel syndrome    right hand   Colon polyps    Complication of anesthesia    Fentanyl, Versed-makes extra hyper, bradycardia x 1 in PACU, [facility] (08/15/11 cardiology felt neostigmine may have resulted in AV nodal block)    Coronary artery disease    Depression    denies   Dysplasia of vulva    Hypertension    Palpitations    PSVT, s/p adenosine 08/04/16   S/P breast lumpectomy 07/04/07   R breast   S/P radiation therapy 2009    Past Surgical History:  Procedure Laterality Date   APPENDECTOMY     age 71   CESAREAN SECTION     x 2   COLONOSCOPY W/ POLYPECTOMY     COLONOSCOPY WITH PROPOFOL N/A 04/27/2016   Procedure: COLONOSCOPY WITH PROPOFOL;  Surgeon: Charolett Bumpers, MD;  Location: [facility] ENDOSCOPY;  Service: Endoscopy;  Laterality: N/A;   DILATION AND CURETTAGE OF UTERUS     multiple   ENDARTERECTOMY Right 06/09/2019   ENDARTERECTOMY Right 06/09/2019   Procedure: ENDARTERECTOMY CAROTID RIGHT;  Surgeon: Sherren Kerns, MD;  Location: [facility] OR;  Service: Vascular;  Laterality: Right;   INTRAUTERINE DEVICE INSERTION     IUD REMOVAL  LEFT HEART CATH AND CORONARY ANGIOGRAPHY N/A 02/27/2020   Procedure: LEFT HEART CATH AND CORONARY ANGIOGRAPHY;  Surgeon: Yates Decamp, MD;  Location: [facility] INVASIVE CV LAB;  Service: Cardiovascular;  Laterality: N/A;   MASTECTOMY PARTIAL / LUMPECTOMY W/ AXILLARY LYMPHADENECTOMY Right    lumpectomy and lymph nodes removed   PATCH ANGIOPLASTY Right 06/09/2019   Procedure: Patch Angioplasty of right carotid artery using hemashield paltinum finesse patch;  Surgeon: Sherren Kerns, MD;  Location: [facility] OR;  Service:  Vascular;  Laterality: Right;   TUBAL LIGATION     VULVA SURGERY     Multiple times for dysplasia    Soc Hx - widowed after 24 years of marriage. Two daughters, one grandchild. Lives alone. Works part-time as a Leisure centre manager. Helps with horses, but lately has been too fatigued. Full code   reports that she quit smoking about 25 years ago. Her smoking use included cigarettes. She started smoking about 45 years ago. She has a 15 pack-year smoking history. She has never used smokeless tobacco. She reports current alcohol use. She reports that she does not use drugs.  Allergies  Allergen Reactions   Prednisone Palpitations   Fentanyl Other (See Comments) and Swelling    MAKES SUPER HYPER PER PT  Makes pt "hyper" feels she is "climbing the walls."  Last time she had a colonoscopy with Fentanyl and Versed she was awake the whole time.   Hydrocodone-Acetaminophen Swelling    Wakes up in a "panic" if takes this, ok with oxycodone   Midazolam Anxiety and Swelling    Makes pt "hyper" feels she is "climbing the walls."  Last time she had a colonoscopy with Fentanyl and Versed she was awake the whole time.   Vicodin [Hydrocodone-Acetaminophen] Anxiety    Extreme anxiety.    Family History  Problem Relation Age of Onset   Colon cancer Mother 71   Heart disease Father        heart attack   Colon polyps Brother        one brother had large colon polyp that needed surgery to be removed   Heart disease Paternal Grandfather    Diabetes type II Brother     Prior to Admission medications   Medication Sig Start Date End Date Taking? Authorizing Provider  acetaminophen (TYLENOL) 500 MG tablet Take 1,000 mg by mouth every 6 (six) hours as needed for fever or mild pain.    [provider]  alprazolam Prudy Feeler) 2 MG tablet 1/2-1 tid prn anxiety. Patient taking differently: Take 1-2 mg by mouth 3 (three) times daily as needed for anxiety. 04/19/20   Shade Flood, MD  amLODipine (NORVASC) 2.5  MG tablet Take 1 tablet (2.5 mg total) by mouth daily. Patient not taking: Reported on 09/16/2022 08/04/22 11/02/22  Yates Decamp, MD  Ascorbic Acid (VITAMIN C PO) Take 1 tablet by mouth daily.    [provider]  aspirin 81 MG chewable tablet Chew 81 mg by mouth daily.     [provider]  Cholecalciferol (VITAMIN D3 PO) Take 1 capsule by mouth daily.    [provider]  Coenzyme Q10 (CO Q 10 PO) Take 1 capsule by mouth daily.    [provider]  Garlic 500 MG CAPS Take 500 mg by mouth daily.    [provider]  losartan-hydrochlorothiazide (HYZAAR) 50-12.5 MG tablet Take 1 tablet by mouth daily. 09/30/22   Yates Decamp, MD  nitroGLYCERIN (NITROSTAT) 0.4 MG SL tablet Place 1 tablet (0.4  mg total) under the tongue every 5 (five) minutes as needed for chest pain. 08/04/22   Yates Decamp, MD  Omega-3 Fatty Acids (FISH OIL) 1200 MG CAPS Take 1,200 mg by mouth daily.    [provider]  Red Yeast Rice 600 MG CAPS Take 2 capsules (1,200 mg total) by mouth every evening. Patient not taking: Reported on 09/16/2022 08/05/22   Yates Decamp, MD    Physical Exam: Vitals:   01/24/23 0230 01/24/23 0236 01/24/23 0330 01/24/23 0811  BP:   132/88   Pulse: 82 99 87   Resp: 18  16   Temp:  97.6 F (36.4 C) 99 F (37.2 C) 97.8 F (36.6 C)  TempSrc:  Oral Oral Oral  SpO2: 95% 95% 96%   Weight:      Height:        Physical Exam Constitutional:      General: She is not in acute distress.    Appearance: She is obese. She is not ill-appearing.  HENT:     Head: Normocephalic and atraumatic.     Mouth/Throat:     Mouth: Mucous membranes are moist.     Pharynx: No oropharyngeal exudate.  Eyes:     Extraocular Movements: Extraocular movements intact.     Pupils: Pupils are equal, round, and reactive to light.  Neck:     Thyroid: No thyromegaly.     Vascular: No hepatojugular reflux or JVD.  Cardiovascular:     Rate and Rhythm: Normal rate and regular rhythm.      Heart sounds: No murmur heard. Pulmonary:     Effort: Pulmonary effort is normal.     Breath sounds: Examination of the left-middle field reveals rhonchi. Wheezing and rhonchi present. No decreased breath sounds or rales.  Abdominal:     General: Bowel sounds are normal.     Palpations: Abdomen is soft.     Tenderness: There is no abdominal tenderness.  Musculoskeletal:        General: Normal range of motion.     Cervical back: Neck supple.     Right lower leg: Edema present.     Left lower leg: Edema present.     Comments: Trace edema bilateral distal LE  Skin:    General: Skin is warm and dry.  Neurological:     General: No focal deficit present.     Mental Status: She is alert and oriented to person, place, and time.     Cranial Nerves: No cranial nerve deficit.  Psychiatric:        Mood and Affect: Mood is anxious.      Labs on Admission: I have personally reviewed following labs and imaging studies  CBC: Recent Labs  Lab 01/23/23 2208  WBC 7.2  NEUTROABS 5.0  HGB 12.8  HCT 36.7  MCV 91.5  PLT 333   Basic Metabolic Panel: Recent Labs  Lab 01/23/23 2208  NA 128*  K 3.5  CL 91*  CO2 26  GLUCOSE 114*  BUN 16  CREATININE 0.78  CALCIUM 9.7   GFR: Estimated Creatinine Clearance: 80.2 mL/min (by C-G formula based on SCr of 0.78 mg/dL). Liver Function Tests: Recent Labs  Lab 01/23/23 2208  AST 26  ALT 27  ALKPHOS 47  BILITOT 0.8  PROT 6.5  ALBUMIN 3.9   No results for input(s): "LIPASE", "AMYLASE" in the last 168 hours. No results for input(s): "AMMONIA" in the last 168 hours. Coagulation Profile: No results for input(s): "INR", "PROTIME" in  the last 168 hours. Cardiac Enzymes: No results for input(s): "CKTOTAL", "CKMB", "CKMBINDEX", "TROPONINI" in the last 168 hours. BNP (last 3 results) No results for input(s): "PROBNP" in the last 8760 hours. HbA1C: No results for input(s): "HGBA1C" in the last 72 hours. CBG: No results for input(s):  "GLUCAP" in the last 168 hours. Lipid Profile: No results for input(s): "CHOL", "HDL", "LDLCALC", "TRIG", "CHOLHDL", "LDLDIRECT" in the last 72 hours. Thyroid Function Tests: No results for input(s): "TSH", "T4TOTAL", "FREET4", "T3FREE", "THYROIDAB" in the last 72 hours. Anemia Panel: No results for input(s): "VITAMINB12", "FOLATE", "FERRITIN", "TIBC", "IRON", "RETICCTPCT" in the last 72 hours. Urine analysis:    Component Value Date/Time   COLORURINE STRAW (A) 06/06/2019 1159   APPEARANCEUR CLEAR 06/06/2019 1159   LABSPEC 1.005 06/06/2019 1159   PHURINE 7.0 06/06/2019 1159   GLUCOSEU NEGATIVE 06/06/2019 1159   HGBUR SMALL (A) 06/06/2019 1159   BILIRUBINUR NEGATIVE 06/06/2019 1159   BILIRUBINUR neg 10/04/2013 2039   KETONESUR NEGATIVE 06/06/2019 1159   PROTEINUR NEGATIVE 06/06/2019 1159   UROBILINOGEN 0.2 10/04/2013 2039   NITRITE NEGATIVE 06/06/2019 1159   LEUKOCYTESUR NEGATIVE 06/06/2019 1159    Radiological Exams on Admission: I have personally reviewed images CT Angio Chest PE W and/or Wo Contrast  Result Date: 01/24/2023 CLINICAL DATA:  Pulmonary embolism suspected, high probability. EXAM: CT ANGIOGRAPHY CHEST WITH CONTRAST TECHNIQUE: Multidetector CT imaging of the chest was performed using the standard protocol during bolus administration of intravenous contrast. Multiplanar CT image reconstructions and MIPs were obtained to evaluate the vascular anatomy. RADIATION DOSE REDUCTION: This exam was performed according to the departmental dose-optimization program which includes automated exposure control, adjustment of the mA and/or kV according to patient size and/or use of iterative reconstruction technique. CONTRAST:  75mL OMNIPAQUE IOHEXOL 350 MG/ML SOLN COMPARISON:  10/16/2022. FINDINGS: Cardiovascular: Heart is normal in size and there is a trace pericardial effusion. Multi-vessel coronary artery calcifications are noted. There is atherosclerotic calcification of the aorta without  evidence of aneurysm. The pulmonary trunk is normal in caliber. No evidence of pulmonary embolism. Mediastinum/Nodes: Enlarged lymph nodes are present in the mediastinum measuring up to 1.5 cm in the subcarinal space. Enlarged hilar lymph nodes are present bilaterally measuring up to 1.4 cm on the right. No axillary lymphadenopathy. Surgical clips are present in the right axilla. The thyroid gland, trachea, and esophagus are within normal limits. Lungs/Pleura: Mild centrilobular emphysematous changes are present in the lungs. Bronchial wall thickening and atelectasis are present bilaterally. No effusion or pneumothorax. Upper Abdomen: There are bilateral adrenal nodules, likely adenomas as seen on multiple prior exams. No acute abnormality. Musculoskeletal: Surgical changes are present in the right breast. Degenerative changes are present in the thoracic spine. A stable compression deformity is noted in the superior endplate at L1. Review of the MIP images confirms the above findings. IMPRESSION: 1. No evidence of pulmonary embolism. 2. Mild bronchial wall thickening bilaterally with atelectasis, which may be infectious or inflammatory. 3. Nonspecific mediastinal and hilar lymphadenopathy, possibly reactive. Emphysema. 4. Coronary artery calcifications. 5. Aortic atherosclerosis. Electronically Signed   By: Thornell Sartorius M.D.   On: 01/24/2023 00:39   DG Chest 2 View  Result Date: 01/23/2023 CLINICAL DATA:  Cough. EXAM: CHEST - 2 VIEW COMPARISON:  Radiograph 01/11/2019 FINDINGS: The heart is normal in size. Atherosclerosis of the thoracic aorta. Mild peribronchial thickening without focal airspace disease. No pleural effusion or pneumothorax. No pulmonary edema. Right axillary surgical clips. No acute osseous findings. IMPRESSION: Mild peribronchial thickening without  focal airspace disease. Electronically Signed   By: Narda Rutherford M.D.   On: 01/23/2023 22:19    EKG: I have personally reviewed EKG: Sinus  tach, PVC, LAE, LAFB, minimal ST elevation  Assessment/Plan Principal Problem:   Acute CHF (congestive heart failure) (HCC) Active Problems:   Anxiety disorder   Pure hypercholesterolemia   Hypertension    Assessment and Plan: * Acute CHF (congestive heart failure) (HCC) Patient with active CAD with PCI/Stent x 2, known obstructive disease circ. She has had recent cardiac testing under direction of Dr. Jacinto Halim - w/o acute disease. Her last Echo 05/25/22 with grade I DD and EF 55-60%. No prior h/o CHF. Now presenting with a several month h/o progressive SOB/DOE, fatigue, weight gain with fluid retention. She has been treated x 3 since April for PNA with no residual infiltrates on CXR or CTA chest. BNP mildly elevated. Troponin x 2 at 35, 38 c/w strain w/o acute changes on EKG  Plan Cardiac tele obs admit  IV lasix 60 mg TID x 6 doses  Continue ARB, no BB at this time - may add later in admission or defer to Dr. Jacinto Halim as outpatient  ECHO  Anxiety disorder Long standing problem being treated by patient directed Xanax. She is followed by psychiatry - [facility]) - zoom calls for med check. Buspar has been prescribed which she hasn't taken. On interview she has pressured speech and anxiety.  Plan BuSpar  Continue Xanax with taper as BuSpar takes affect  Outpatient f/o psychiatry  Pure hypercholesterolemia Last lipid panel in Feb '24 with total Cholesterol 220, HDL 51, LD 146. Target LDL for patient with ASVD <16.  Plan Discontinue red yeast rice  Crestor daily - educated patient as to the critical importance of lowering LDL in patient with CAD and Carotid disease.   Hypertension BP controlled but optimally. She does take Losartan/Hct - but not full dose.  Plan Losartan 25 mg daily while inpatient and on IV lasix  Continue amolodipine - which she hasn't been taking as prescribed.        DVT prophylaxis: Lovenox Code Status: Full Code Family Communication: spoke with daughter  Leinaala Catanese  Disposition Plan: home  Consults called: none  Admission status: Observation, Telemetry bed   Illene Regulus, MD [facility] 01/24/2023, 9:56 AM

## 2023-01-24 NOTE — Plan of Care (Signed)
  Problem: Clinical Measurements: Goal: Ability to maintain clinical measurements within normal limits will improve Outcome: Progressing Goal: Respiratory complications will improve Outcome: Progressing Goal: Cardiovascular complication will be avoided Outcome: Progressing   Problem: Nutrition: Goal: Adequate nutrition will be maintained Outcome: Progressing   Problem: Safety: Goal: Ability to remain free from injury will improve Outcome: Progressing

## 2023-01-24 NOTE — ED Notes (Signed)
[facility] called for transport. 

## 2023-01-25 ENCOUNTER — Inpatient Hospital Stay (HOSPITAL_COMMUNITY): Payer: Medicare Other

## 2023-01-25 DIAGNOSIS — E7800 Pure hypercholesterolemia, unspecified: Secondary | ICD-10-CM | POA: Diagnosis not present

## 2023-01-25 DIAGNOSIS — I5031 Acute diastolic (congestive) heart failure: Secondary | ICD-10-CM

## 2023-01-25 DIAGNOSIS — I10 Essential (primary) hypertension: Secondary | ICD-10-CM

## 2023-01-25 DIAGNOSIS — I5021 Acute systolic (congestive) heart failure: Secondary | ICD-10-CM

## 2023-01-25 DIAGNOSIS — F411 Generalized anxiety disorder: Secondary | ICD-10-CM | POA: Diagnosis not present

## 2023-01-25 MED ORDER — FUROSEMIDE 10 MG/ML IJ SOLN
60.0000 mg | Freq: Two times a day (BID) | INTRAMUSCULAR | Status: DC
  Filled 2023-01-25: qty 6

## 2023-01-25 MED ORDER — POTASSIUM CHLORIDE 20 MEQ PO PACK
40.0000 meq | PACK | Freq: Two times a day (BID) | ORAL | Status: DC
  Administered 2023-01-25 – 2023-01-27 (×6): 40 meq via ORAL
  Filled 2023-01-25 (×6): qty 2

## 2023-01-25 MED ORDER — ORAL CARE MOUTH RINSE: 15.0000 mL | OROMUCOSAL | Status: DC | PRN

## 2023-01-25 NOTE — Progress Notes (Signed)
  Echocardiogram 2D Echocardiogram has been performed.  Delcie Roch 01/25/2023, 5:13 PM

## 2023-01-25 NOTE — Plan of Care (Signed)

## 2023-01-25 NOTE — TOC CM/SW Note (Signed)
Transition of Care [facility]) - Inpatient Brief Assessment   Patient Details  Name: Madison West MRN: [id number] Date of Birth: 1955-11-25  Transition of [facility]) CM/SW Contact:    Gala Lewandowsky, RN Phone Number: 01/25/2023, 11:51 AM   Clinical Narrative: Patient presented for shortness of breath and weakness. Case Manager spoke with patient and she states she was independent from home. Patient is currently on oxygen; however, feels that she will not need prior to transition home. Patient has no DME or HH services in the home. Case Manager will continue to follow for transition of care needs as the patient progresses.   Transition of Care Asessment: Insurance and Status: Insurance coverage has been reviewed Patient has primary care physician: Yes Home environment has been reviewed: reviewed Prior level of function:: independent Prior/Current Home Services: No current home services Social Determinants of Health Reivew: SDOH reviewed no interventions necessary Readmission risk has been reviewed: Yes Transition of care needs: no transition of care needs at this time

## 2023-01-25 NOTE — Progress Notes (Signed)
Heart Failure Navigator Progress Note  Assessed for Heart & Vascular TOC clinic readiness.  Patient does not meet criteria due to [facility] patient.   Navigator will sign off at this time.     , BSN, RN Heart Failure Nurse Navigator Secure Chat Only   

## 2023-01-25 NOTE — Progress Notes (Addendum)
PROGRESS NOTE    Madison West  NGE:[id number] DOB: [birth year] DOA: 01/23/2023 PCP: [name], MD    Brief Narrative:  Madison West, is a 67 years old female with  past  medical  history of PSVT, anxiety/depression, breast cancer status post lumpectomy, chemo and radiation in 2009, hypertension, hyperlipidemia, right carotid artery stenosis status post carotid endarterectomy in 2020, CAD status post stenting, mild to moderate aortic stenosis, GERD presented to the ED with complaints of shortness of breath, weight gain, and generalized weakness.  Patient was slightly tachycardic on arrival but afebrile.  In the ED patient was hypoxic with pulse ox of 90s on room air.  Labs significant for sodium 128, chloride 91, creatinine 0.7, BNP 462, troponin 35> 38, COVID/influenza/RSV PCR negative.  Patient has been non-adherent to medical recommendations.  In the ED, patient was afebrile.  Overweight.  Initial labs showed hyponatremia with sodium of 128, BNP 462, Troponin  35 to 38. CTA chest - no PE, bronchial wall thickening, emphysema, CAD, no pulmonary edema. CXR -did not see any infiltrate., EKG showed PVC, LAE, LAFB minimal ST elevation.  Patient was then admitted hospital for further evaluation and treatment.  Assessment and Plan:  Acute CHF (congestive heart failure)  History of CAD with stents.  Seen by Dr. Jacinto Halim as outpatient.  Recent cardiac testing under direction of Dr. Jacinto Halim - w/o acute disease.  Last 2D echo 05/25/22 with grade I DD and EF 55-60%.  Was treated 3 times for pneumonia in the past.  BNP mildly elevated at 462.Marland Kitchen Troponin x 2 at 35 and flat.  on IV Lasix.  Holding off with ARB and beta-blocker.  CT angiogram of chest showed no evidence of pulmonary embolism but mild bronchial wall thickening.  Continue supportive care with IV Lasix, intake and output charting Daily weights.  Patient is negative balance  for 2510 mL and had 2 L urinary output.  Check 2D echocardiogram.  Pending    Anxiety disorder Follows up with psychiatry - [facility])  Buspar has been prescribed which she hasn't taken. Continue Xanax with taper as BuSpar takes affect. Would recommend outpatient follow-up with psychiatry.     Spoke with the patient regarding deep breathing exercises and relaxation.          Pure hypercholesterolemia Was on reduced rice.  Crestor has been initiated at this time due to CAD  Hypokalemia.  Will replenish orally.  Will change potassium 40 mEq twice daily.  While on diuretics.  Mild hyponatremia likely secondary to congestive heart failure and diuretics.  Closely monitor.  Sodium level has improved to 131 from 128.   Hypertension Patient is on losartan/Hct -, continue losartan.  Was supposed to be on amlodipine which has been reinitiated.   DVT prophylaxis: enoxaparin (LOVENOX) injection 40 mg Start: 01/24/23 1200   Code Status:     Code Status: Full Code  Disposition:  Home likely in 1 to 2 days.  Status is: Inpatient Remains inpatient appropriate because: Decompensated heart failure, anxiety   Family Communication: None at bedside.  Consultants:  None  Procedures:  None  Antimicrobials:  None  Anti-infectives (From admission, onward)    None      Subjective:  Today, patient was seen and examined at bedside.  States that she feels anxious about a lot of stuff.  Has been diuresing well and going to the bathroom often.  Still has shortness of breath but little better than yesterday.  Has dyspnea on exertion no chest  pain fever chills.  Objective: Vitals:   01/24/23 2004 01/25/23 0405 01/25/23 0500 01/25/23 0753  BP: 108/79  111/78   Pulse: 86 95 76   Resp: 19 20 20    Temp: 98.2 F (36.8 C)  98.2 F (36.8 C) 98.5 F (36.9 C)  TempSrc: Oral  Axillary Oral  SpO2: 96% 90% 97%   Weight:   93.6 kg   Height:        Intake/Output Summary (Last 24 hours) at 01/25/2023 1254 Last data filed at 01/25/2023 1100 Gross per 24 hour  Intake 240 ml   Output 2450 ml  Net -2210 ml   Filed Weights   01/23/23 2155 01/25/23 0500  Weight: 95.3 kg 93.6 kg    Physical Examination: Body mass index is 31.37 kg/m.  General: Obese built, not in obvious distress on nasal cannula oxygen, anxious HENT:   No scleral pallor or icterus noted. Oral mucosa is moist.  Chest: Diminished breath sounds bilaterally.   CVS: S1 &S2 heard. No murmur.  Regular rate and rhythm. Abdomen: Soft, nontender, nondistended.  Bowel sounds are heard.   Extremities: No cyanosis, clubbing or trace edema peripheral pulses are palpable. Psych: Alert, awake and oriented, anxious CNS:  No cranial nerve deficits.  Power equal in all extremities.   Skin: Warm and dry.  No rashes noted.  Data Reviewed:   CBC: Recent Labs  Lab 01/23/23 2208  WBC 7.2  NEUTROABS 5.0  HGB 12.8  HCT 36.7  MCV 91.5  PLT 333    Basic Metabolic Panel: Recent Labs  Lab 01/23/23 2208 01/25/23 0418  NA 128* 131*  K 3.5 3.1*  CL 91* 92*  CO2 26 27  GLUCOSE 114* 116*  BUN 16 17  CREATININE 0.78 0.92  CALCIUM 9.7 8.8*    Liver Function Tests: Recent Labs  Lab 01/23/23 2208  AST 26  ALT 27  ALKPHOS 47  BILITOT 0.8  PROT 6.5  ALBUMIN 3.9     Radiology Studies: CT Angio Chest PE W and/or Wo Contrast  Result Date: 01/24/2023 CLINICAL DATA:  Pulmonary embolism suspected, high probability. EXAM: CT ANGIOGRAPHY CHEST WITH CONTRAST TECHNIQUE: Multidetector CT imaging of the chest was performed using the standard protocol during bolus administration of intravenous contrast. Multiplanar CT image reconstructions and MIPs were obtained to evaluate the vascular anatomy. RADIATION DOSE REDUCTION: This exam was performed according to the departmental dose-optimization program which includes automated exposure control, adjustment of the mA and/or kV according to patient size and/or use of iterative reconstruction technique. CONTRAST:  75mL OMNIPAQUE IOHEXOL 350 MG/ML SOLN COMPARISON:   10/16/2022. FINDINGS: Cardiovascular: Heart is normal in size and there is a trace pericardial effusion. Multi-vessel coronary artery calcifications are noted. There is atherosclerotic calcification of the aorta without evidence of aneurysm. The pulmonary trunk is normal in caliber. No evidence of pulmonary embolism. Mediastinum/Nodes: Enlarged lymph nodes are present in the mediastinum measuring up to 1.5 cm in the subcarinal space. Enlarged hilar lymph nodes are present bilaterally measuring up to 1.4 cm on the right. No axillary lymphadenopathy. Surgical clips are present in the right axilla. The thyroid gland, trachea, and esophagus are within normal limits. Lungs/Pleura: Mild centrilobular emphysematous changes are present in the lungs. Bronchial wall thickening and atelectasis are present bilaterally. No effusion or pneumothorax. Upper Abdomen: There are bilateral adrenal nodules, likely adenomas as seen on multiple prior exams. No acute abnormality. Musculoskeletal: Surgical changes are present in the right breast. Degenerative changes are present in the thoracic spine.  A stable compression deformity is noted in the superior endplate at L1. Review of the MIP images confirms the above findings. IMPRESSION: 1. No evidence of pulmonary embolism. 2. Mild bronchial wall thickening bilaterally with atelectasis, which may be infectious or inflammatory. 3. Nonspecific mediastinal and hilar lymphadenopathy, possibly reactive. Emphysema. 4. Coronary artery calcifications. 5. Aortic atherosclerosis. Electronically Signed   By: Thornell Sartorius M.D.   On: 01/24/2023 00:39   DG Chest 2 View  Result Date: 01/23/2023 CLINICAL DATA:  Cough. EXAM: CHEST - 2 VIEW COMPARISON:  Radiograph 01/11/2019 FINDINGS: The heart is normal in size. Atherosclerosis of the thoracic aorta. Mild peribronchial thickening without focal airspace disease. No pleural effusion or pneumothorax. No pulmonary edema. Right axillary surgical clips. No  acute osseous findings. IMPRESSION: Mild peribronchial thickening without focal airspace disease. Electronically Signed   By: Narda Rutherford M.D.   On: 01/23/2023 22:19      LOS: 1 day    Joycelyn Das, MD [facility] Available via Epic secure chat 7am-7pm After these hours, please refer to coverage provider listed on [email or web address] 01/25/2023, 12:54 PM

## 2023-01-25 NOTE — Plan of Care (Signed)
Nutrition Education Note  RD consulted for nutrition education regarding new onset CHF.  RD provided "Low Sodium Nutrition Therapy" handout from the [employer]. Reviewed patient's dietary recall. Provided examples on ways to decrease sodium intake in diet. Discouraged intake of processed foods and use of salt shaker. Encouraged fresh fruits and vegetables as well as whole grain sources of carbohydrates to maximize fiber intake.   RD discussed why it is important for patient to adhere to diet recommendations, and emphasized the role of fluids, foods to avoid, and importance of weighing self daily. Teach back method used.  Expect optimal  compliance.  Body mass index is 31.37 kg/m. Pt meets criteria for obese Class I  based on current BMI.  Current diet order is Heart Healthy. Labs and medications reviewed. No further nutrition interventions warranted at this time. RD contact information provided. If additional nutrition issues arise, please re-consult RD.   Leodis Rains, RDN, LDN  Clinical Nutrition

## 2023-01-25 NOTE — Plan of Care (Signed)
  Problem: Clinical Measurements: Goal: Ability to maintain clinical measurements within normal limits will improve Outcome: Progressing Goal: Respiratory complications will improve Outcome: Progressing Goal: Cardiovascular complication will be avoided Outcome: Progressing   Problem: Coping: Goal: Level of anxiety will decrease Outcome: Progressing   Problem: Pain Managment: Goal: General experience of comfort will improve Outcome: Progressing   Problem: Safety: Goal: Ability to remain free from injury will improve Outcome: Progressing

## 2023-01-25 NOTE — Hospital Course (Addendum)
[name] was admitted to the hospital with the working diagnosis of heart failure decompensation.   67 yo female with the past medical history of PSVT, anxiety, breast cancer sp lumpectomy/ radiation 2009, hypertension, hyperlipidemia, coronary artery disease, carotid artery disease, and mild to moderate aortic stenosis who presented with dyspnea, weight gain and generalized weakness. She has been not compliant with her medical therapy. On her initial physical examination her blood pressure was 132/88, HR 87, RR 16 and 02 saturation 96%, lungs rhonchi and wheezing, heart with S1 and S2 present and regular, positive systolic murmur at the base, abdomen with no distention and positive lower extremity edema.   Patient was placed on IV diuretic therapy.   Echocardiogram with reduced LV systolic function, 30 to 35% with severe aortic stenosis.  08/07 cardiac catheterization with mid LAD 95% lesion, and underwent stent placement.  CT surgery consulted for severe aortic stenosis.   08/11 patient on floor and had pulseless VT. CPR initiated, given Epi, and shocked x 2 with ROSC. Patient alert after CPR event but slightly confused. Patient again went into VT and was shocked. Given amio, mag, calcium. Patient started on amio drip. Patient transferred to [facility] ICU and cardiology consulted. EKG showing ST elevation in anterior leads. Code STEMI. PCCM consulted.   Required IABP placement 8/13. Ultimately underwent TAVR 8/15.  8/16 Doing better, weaning pressors, remains intubated, IABP in place 8/17 IABP removed 8/18 Extubated 8/19 Swan removed, Lasix gtt stopped. PICC placed. 8/20 MBS. Weaning milrinone. Started spiro and losartan.  8/21 Foley replaced, appreciate Uro NP's help. L internal jugular DVT and L cephalic SVT    8/22 milrinone wean to 0.125. Had a BM 8/24 off all vasoactives. O2 down to 4Lpm  08/26 transferred to [facility]. Pending transfer to CIR.

## 2023-01-26 DIAGNOSIS — I5031 Acute diastolic (congestive) heart failure: Secondary | ICD-10-CM | POA: Diagnosis not present

## 2023-01-26 DIAGNOSIS — E7800 Pure hypercholesterolemia, unspecified: Secondary | ICD-10-CM | POA: Diagnosis not present

## 2023-01-26 DIAGNOSIS — F411 Generalized anxiety disorder: Secondary | ICD-10-CM | POA: Diagnosis not present

## 2023-01-26 DIAGNOSIS — I10 Essential (primary) hypertension: Secondary | ICD-10-CM | POA: Diagnosis not present

## 2023-01-26 MED ORDER — FUROSEMIDE 10 MG/ML IJ SOLN
40.0000 mg | Freq: Every day | INTRAMUSCULAR | Status: DC
  Administered 2023-01-26 – 2023-01-31 (×6): 40 mg via INTRAVENOUS
  Filled 2023-01-26 (×6): qty 4

## 2023-01-26 MED ORDER — FUROSEMIDE 10 MG/ML IJ SOLN: 40.0000 mg | Freq: Two times a day (BID) | INTRAMUSCULAR | Status: DC

## 2023-01-26 NOTE — Plan of Care (Signed)

## 2023-01-26 NOTE — Plan of Care (Signed)
  Problem: Clinical Measurements: Goal: Ability to maintain clinical measurements within normal limits will improve Outcome: Progressing Goal: Will remain free from infection Outcome: Progressing Goal: Respiratory complications will improve Outcome: Progressing Goal: Cardiovascular complication will be avoided Outcome: Progressing   Problem: Nutrition: Goal: Adequate nutrition will be maintained Outcome: Progressing   Problem: Pain Managment: Goal: General experience of comfort will improve Outcome: Progressing   Problem: Safety: Goal: Ability to remain free from injury will improve Outcome: Progressing

## 2023-01-26 NOTE — Progress Notes (Addendum)
PROGRESS NOTE    Madison West  ATF:[id number] DOB: 11/23/1955 DOA: 01/23/2023 PCP: Madison Coca, MD    Brief Narrative:   Madison West, is a 67 years old female with  past  medical  history of PSVT, anxiety/depression, breast cancer status post lumpectomy, chemo and radiation in 2009, hypertension, hyperlipidemia, right carotid artery stenosis status post carotid endarterectomy in 2020, CAD status post stenting, mild to moderate aortic stenosis, GERD presented to the ED with complaints of shortness of breath, weight gain, and generalized weakness.  Patient was slightly tachycardic on arrival but afebrile.  In the ED, patient was hypoxic with pulse ox of 90s on room air.  Labs significant for sodium 128, chloride 91, creatinine 0.7, BNP 462, troponin 35> 38, COVID/influenza/RSV PCR negative.  Patient has been non-adherent to medical recommendations.  Initial labs showed hyponatremia with sodium of 128, BNP 462, Troponin  35 to 38. CTA chest - no PE, bronchial wall thickening, emphysema, CAD, no pulmonary edema. CXR -did not see any infiltrate., EKG showed PVC, LAE, LAFB minimal ST elevation.  Patient was then admitted to the hospital for further evaluation and treatment.  Assessment and Plan:  Acute diastolic CHF History of CAD with stents.  Seen by Dr. Jacinto Halim as outpatient.  Recent cardiac testing under direction of Dr. Jacinto Halim - w/o acute disease.  Last 2D echo 05/25/22 with grade I DD and EF 55-60%.  Was treated 3 times for pneumonia in the past.  BNP mildly elevated at 462.Marland Kitchen Troponin x 2 at 35 and flat.  on IV Lasix 40mg  BID.  Holding off with ARB and beta-blocker.  CT angiogram of chest showed no evidence of pulmonary embolism but mild bronchial wall thickening.  Continue supportive care with IV Lasix, intake and output charting Daily weights.  Patient is negative balance  for 5600 mL and had 3500 mL urinary output but  despite that no significant weight change.  Will continue with IV diuretics  but decrease to 40 mg daily.  Patient states that she has been drinking quite a bit.  Will put 1500 mL fluid restriction for now.   Anxiety disorder Follows up with psychiatry - [facility])  Buspar has been prescribed which she hasn't taken. Continue Xanax with taper as BuSpar takes affect. Would recommend outpatient follow-up with psychiatry.     Spoke with the patient regarding deep breathing exercises and relaxation.          Pure hypercholesterolemia Was on reduced rice.  Crestor has been initiated at this time due to CAD  Hypokalemia.  Improved after repletion.  Will continue while on diuretics.  Mild hyponatremia likely secondary to congestive heart failure and diuretics.  Closely monitor.  Sodium level had improved to 131 from 128 but now back to 126.  Will decrease diuretics to once daily from today..   Hypertension Patient is on losartan/Hct -, continue losartan.  Was supposed to be on amlodipine which has been reinitiated.   DVT prophylaxis: enoxaparin (LOVENOX) injection 40 mg Start: 01/24/23 1200   Code Status:     Code Status: Full Code  Disposition:  Home likely in 1 to 2 days.  Status is: Inpatient  Remains inpatient appropriate because: Decompensated heart failure, anxiety   Family Communication: None at bedside.  Consultants:  None  Procedures:  None  Antimicrobials:  None  Anti-infectives (From admission, onward)    None      Subjective:  Today, patient was seen and examined.  States that she has been  urinating a lot and has been able to sleep better.  Still has some anxiety and shortness of breath.  She is worried that her weight has not gone down despite diuresis.  Repeated weighing with similar findings.  Spoke about her fluid restriction.  Objective: Vitals:   01/25/23 1940 01/25/23 1945 01/26/23 0343 01/26/23 1015  BP:  134/83 117/83   Pulse: 85 85 92   Resp: 15 19 20    Temp:  98.4 F (36.9 C) 98.1 F (36.7 C)   TempSrc:  Oral Axillary    SpO2: 97% 97% 98%   Weight:   95.3 kg 95.2 kg  Height:        Intake/Output Summary (Last 24 hours) at 01/26/2023 1122 Last data filed at 01/26/2023 0345 Gross per 24 hour  Intake 460 ml  Output 3550 ml  Net -3090 ml   Filed Weights   01/25/23 0500 01/26/23 0343 01/26/23 1015  Weight: 93.6 kg 95.3 kg 95.2 kg    Physical Examination: Body mass index is 31.9 kg/m.   General: Obese built, not in obvious distress on nasal cannula oxygen, anxious HENT:   No scleral pallor or icterus noted. Oral mucosa is moist.  Chest: Diminished breath sounds bilaterally.   CVS: S1 &S2 heard. No murmur.  Regular rate and rhythm. Abdomen: Soft, nontender, nondistended.  Bowel sounds are heard.   Extremities: No cyanosis, clubbing or trace edema peripheral pulses are palpable. Psych: Alert, awake and oriented, anxious CNS:  No cranial nerve deficits.  Power equal in all extremities.   Skin: Warm and dry.  No rashes noted.  Data Reviewed:   CBC: Recent Labs  Lab 01/23/23 2208 01/26/23 0311  WBC 7.2 7.8  NEUTROABS 5.0  --   HGB 12.8 13.9  HCT 36.7 41.1  MCV 91.5 92.4  PLT 333 362    Basic Metabolic Panel: Recent Labs  Lab 01/23/23 2208 01/25/23 0418 01/26/23 0311  NA 128* 131* 126*  K 3.5 3.1* 3.9  CL 91* 92* 92*  CO2 26 27 24   GLUCOSE 114* 116* 117*  BUN 16 17 18   CREATININE 0.78 0.92 0.84  CALCIUM 9.7 8.8* 8.4*  MG  --   --  2.3    Liver Function Tests: Recent Labs  Lab 01/23/23 2208  AST 26  ALT 27  ALKPHOS 47  BILITOT 0.8  PROT 6.5  ALBUMIN 3.9     Radiology Studies: No results found.    LOS: 2 days    Joycelyn Das, MD [facility] Available via Epic secure chat 7am-7pm After these hours, please refer to coverage provider listed on [email or web address] 01/26/2023, 11:22 AM

## 2023-01-27 ENCOUNTER — Encounter (HOSPITAL_COMMUNITY)
Admission: EM | Disposition: A | Discharge status: Rehabilitation Facility | Payer: Self-pay | Source: Home / Self Care | Attending: Pulmonary Disease

## 2023-01-27 DIAGNOSIS — I10 Essential (primary) hypertension: Secondary | ICD-10-CM | POA: Diagnosis not present

## 2023-01-27 DIAGNOSIS — I5021 Acute systolic (congestive) heart failure: Secondary | ICD-10-CM

## 2023-01-27 DIAGNOSIS — I110 Hypertensive heart disease with heart failure: Secondary | ICD-10-CM | POA: Insufficient documentation

## 2023-01-27 DIAGNOSIS — E7800 Pure hypercholesterolemia, unspecified: Secondary | ICD-10-CM | POA: Diagnosis not present

## 2023-01-27 DIAGNOSIS — I350 Nonrheumatic aortic (valve) stenosis: Secondary | ICD-10-CM

## 2023-01-27 DIAGNOSIS — F411 Generalized anxiety disorder: Secondary | ICD-10-CM | POA: Diagnosis not present

## 2023-01-27 DIAGNOSIS — Z87891 Personal history of nicotine dependence: Secondary | ICD-10-CM

## 2023-01-27 DIAGNOSIS — I2510 Atherosclerotic heart disease of native coronary artery without angina pectoris: Secondary | ICD-10-CM | POA: Insufficient documentation

## 2023-01-27 DIAGNOSIS — I5031 Acute diastolic (congestive) heart failure: Secondary | ICD-10-CM | POA: Diagnosis not present

## 2023-01-27 DIAGNOSIS — F419 Anxiety disorder, unspecified: Secondary | ICD-10-CM

## 2023-01-27 DIAGNOSIS — I5020 Unspecified systolic (congestive) heart failure: Secondary | ICD-10-CM | POA: Insufficient documentation

## 2023-01-27 DIAGNOSIS — I6523 Occlusion and stenosis of bilateral carotid arteries: Secondary | ICD-10-CM

## 2023-01-27 DIAGNOSIS — I5023 Acute on chronic systolic (congestive) heart failure: Secondary | ICD-10-CM | POA: Diagnosis present

## 2023-01-27 HISTORY — PX: RIGHT/LEFT HEART CATH AND CORONARY ANGIOGRAPHY: CATH118266

## 2023-01-27 LAB — POCT I-STAT EG7
Acid-Base Excess: 0 mmol/L (ref 0.0–2.0)
Bicarbonate: 26.4 mmol/L (ref 20.0–28.0)
Calcium, Ion: 1.22 mmol/L (ref 1.15–1.40)
HCT: 43 % (ref 36.0–46.0)
Hemoglobin: 14.6 g/dL (ref 12.0–15.0)
O2 Saturation: 64 %
Potassium: 4.4 mmol/L (ref 3.5–5.1)
Sodium: 133 mmol/L — ABNORMAL LOW (ref 135–145)
TCO2: 28 mmol/L (ref 22–32)
pCO2, Ven: 47.2 mmHg (ref 44–60)
pH, Ven: 7.355 (ref 7.25–7.43)
pO2, Ven: 35 mmHg (ref 32–45)

## 2023-01-27 LAB — POCT I-STAT 7, (LYTES, BLD GAS, ICA,H+H)
Acid-Base Excess: 0 mmol/L (ref 0.0–2.0)
Bicarbonate: 25 mmol/L (ref 20.0–28.0)
Calcium, Ion: 1.23 mmol/L (ref 1.15–1.40)
HCT: 44 % (ref 36.0–46.0)
Hemoglobin: 15 g/dL (ref 12.0–15.0)
O2 Saturation: 90 %
Potassium: 4.5 mmol/L (ref 3.5–5.1)
Sodium: 133 mmol/L — ABNORMAL LOW (ref 135–145)
TCO2: 26 mmol/L (ref 22–32)
pCO2 arterial: 43 mmHg (ref 32–48)
pH, Arterial: 7.373 (ref 7.35–7.45)
pO2, Arterial: 60 mmHg — ABNORMAL LOW (ref 83–108)

## 2023-01-27 SURGERY — RIGHT/LEFT HEART CATH AND CORONARY ANGIOGRAPHY
Anesthesia: LOCAL

## 2023-01-27 MED ORDER — SODIUM CHLORIDE 0.9 % IV SOLN: 250.0000 mL | INTRAVENOUS | Status: DC | PRN

## 2023-01-27 MED ORDER — ACETAMINOPHEN 325 MG PO TABS: 650.0000 mg | ORAL_TABLET | ORAL | Status: DC | PRN

## 2023-01-27 MED ORDER — HEPARIN SODIUM (PORCINE) 1000 UNIT/ML IJ SOLN
INTRAMUSCULAR | Status: DC | PRN
  Administered 2023-01-27: 4500 [IU] via INTRAVENOUS

## 2023-01-27 MED ORDER — HYDRALAZINE HCL 20 MG/ML IJ SOLN: 10.0000 mg | INTRAMUSCULAR | Status: DC | PRN

## 2023-01-27 MED ORDER — HEPARIN (PORCINE) IN NACL 1000-0.9 UT/500ML-% IV SOLN
INTRAVENOUS | Status: DC | PRN
  Administered 2023-01-27 (×2): 500 mL

## 2023-01-27 MED ORDER — MIDAZOLAM HCL 2 MG/2ML IJ SOLN
INTRAMUSCULAR | Status: DC | PRN
  Administered 2023-01-27: 1 mg via INTRAVENOUS

## 2023-01-27 MED ORDER — VERAPAMIL HCL 2.5 MG/ML IV SOLN
INTRAVENOUS | Status: DC | PRN
  Administered 2023-01-27: 10 mL via INTRA_ARTERIAL

## 2023-01-27 MED ORDER — SODIUM CHLORIDE 0.9 % IV SOLN: INTRAVENOUS | Status: AC

## 2023-01-27 MED ORDER — SPIRONOLACTONE 25 MG PO TABS
25.0000 mg | ORAL_TABLET | Freq: Every day | ORAL | Status: DC
  Administered 2023-01-27 – 2023-01-31 (×5): 25 mg via ORAL
  Filled 2023-01-27 (×5): qty 1

## 2023-01-27 MED ORDER — VERAPAMIL HCL 2.5 MG/ML IV SOLN
INTRAVENOUS | Status: AC
  Filled 2023-01-27: qty 2

## 2023-01-27 MED ORDER — SODIUM CHLORIDE 0.9 % IV SOLN: INTRAVENOUS | Status: DC

## 2023-01-27 MED ORDER — LIDOCAINE HCL (PF) 1 % IJ SOLN
INTRAMUSCULAR | Status: AC
  Filled 2023-01-27: qty 30

## 2023-01-27 MED ORDER — LIDOCAINE HCL (PF) 1 % IJ SOLN
INTRAMUSCULAR | Status: DC | PRN
  Administered 2023-01-27: 2 mL
  Administered 2023-01-27: 15 mL

## 2023-01-27 MED ORDER — MIDAZOLAM HCL 2 MG/2ML IJ SOLN
INTRAMUSCULAR | Status: AC
  Filled 2023-01-27: qty 2

## 2023-01-27 MED ORDER — SODIUM CHLORIDE 0.9% FLUSH
3.0000 mL | Freq: Two times a day (BID) | INTRAVENOUS | Status: DC
  Administered 2023-01-28 – 2023-01-29 (×2): 3 mL via INTRAVENOUS

## 2023-01-27 MED ORDER — ALPRAZOLAM 0.5 MG PO TABS
1.0000 mg | ORAL_TABLET | Freq: Three times a day (TID) | ORAL | Status: DC
  Administered 2023-01-27 – 2023-01-31 (×16): 1 mg via ORAL
  Filled 2023-01-27: qty 2
  Filled 2023-01-27: qty 4
  Filled 2023-01-27 (×6): qty 2
  Filled 2023-01-27: qty 4
  Filled 2023-01-27 (×7): qty 2

## 2023-01-27 MED ORDER — FENTANYL CITRATE (PF) 100 MCG/2ML IJ SOLN
INTRAMUSCULAR | Status: AC
  Filled 2023-01-27: qty 2

## 2023-01-27 MED ORDER — ALUM & MAG HYDROXIDE-SIMETH 200-200-20 MG/5ML PO SUSP
30.0000 mL | Freq: Four times a day (QID) | ORAL | Status: DC | PRN
  Administered 2023-01-27 – 2023-02-01 (×6): 30 mL via ORAL
  Filled 2023-01-27 (×7): qty 30

## 2023-01-27 MED ORDER — FENTANYL CITRATE (PF) 100 MCG/2ML IJ SOLN
INTRAMUSCULAR | Status: DC | PRN
  Administered 2023-01-27: 25 ug via INTRAVENOUS

## 2023-01-27 MED ORDER — HEPARIN SODIUM (PORCINE) 1000 UNIT/ML IJ SOLN
INTRAMUSCULAR | Status: AC
  Filled 2023-01-27: qty 10

## 2023-01-27 MED ORDER — ONDANSETRON HCL 4 MG/2ML IJ SOLN: 4.0000 mg | Freq: Four times a day (QID) | INTRAMUSCULAR | Status: DC | PRN

## 2023-01-27 MED ORDER — SODIUM CHLORIDE 0.9% FLUSH: 3.0000 mL | INTRAVENOUS | Status: DC | PRN

## 2023-01-27 MED ORDER — CALCIUM CARBONATE ANTACID 500 MG PO CHEW: 1.0000 | CHEWABLE_TABLET | Freq: Three times a day (TID) | ORAL | Status: DC | PRN

## 2023-01-27 MED ORDER — IOHEXOL 350 MG/ML SOLN
INTRAVENOUS | Status: DC | PRN
  Administered 2023-01-27: 30 mL

## 2023-01-27 MED ORDER — LABETALOL HCL 5 MG/ML IV SOLN: 10.0000 mg | INTRAVENOUS | Status: DC | PRN

## 2023-01-27 SURGICAL SUPPLY — 11 items
CATH 5FR JL3.5 JR4 ANG PIG MP (CATHETERS) IMPLANT
CATH LANGSTON DUAL LUM PIG 6FR (CATHETERS) IMPLANT
CATH SWAN GANZ 7F STRAIGHT (CATHETERS) IMPLANT
DEVICE RAD COMP TR BAND LRG (VASCULAR PRODUCTS) IMPLANT
GLIDESHEATH SLEND SS 6F .021 (SHEATH) IMPLANT
GUIDEWIRE INQWIRE 1.5J.035X260 (WIRE) IMPLANT
INQWIRE 1.5J .035X260CM (WIRE) ×1
PACK CARDIAC CATHETERIZATION (CUSTOM PROCEDURE TRAY) IMPLANT
SET ATX-X65L (MISCELLANEOUS) IMPLANT
SHEATH PINNACLE 7F 10CM (SHEATH) IMPLANT
SHEATH PROBE COVER 6X72 (BAG) IMPLANT

## 2023-01-27 NOTE — Care Management Important Message (Signed)
Important Message  Patient Details  Name: Madison West MRN: [id number] Date of Birth: 14-Aug-1955   Medicare Important Message Given:  Yes     Renie Ora 01/27/2023, 8:16 AM

## 2023-01-27 NOTE — Interval H&P Note (Signed)
History and Physical Interval Note:  01/27/2023 5:04 PM  Madison West  has presented today for surgery, with the diagnosis of nstemi.  The various methods of treatment have been discussed with the patient and family. After consideration of risks, benefits and other options for treatment, the patient has consented to  Procedure(s): RIGHT/LEFT HEART CATH AND CORONARY ANGIOGRAPHY (N/A) as a surgical intervention.  The patient's history has been reviewed, patient examined, no change in status, stable for surgery.  I have reviewed the patient's chart and labs.  Questions were answered to the patient's satisfaction.    Cardiomyopathy, at least moderate AS   J 

## 2023-01-27 NOTE — Consult Note (Signed)
CARDIOLOGY CONSULT NOTE  Patient ID: Madison West MRN: [id number] DOB/AGE: [birth year] 67 y.o.  Admit date: 01/23/2023 Attending physician: Joycelyn Das, MD Primary Physician:  [name], MD Outpatient Cardiology Provider: Dr. Jacinto Halim Inpatient Cardiologist: Tessa Lerner, DO, [facility]  Reason of consultation: Cardiomyopathy and CHF Referring physician: Joycelyn Das, MD  Chief complaint: Shortness of breath  HPI:  Madison West is a 67 y.o. Caucasian female who presents with a chief complaint of " shortness of breath." Her past medical history and cardiovascular risk factors include: Coronary artery disease s/p PCI to the RCA 02/2020, history of PSVT in February 2028 terminated with adenosine, history of breast cancer status post radiation and chemotherapy in 2009, hypertension, aortic valve stenosis, hyperlipidemia, status post right carotid endarterectomy 06/09/2019, former smoker, extreme anxiety and multiple medication intolerances and phobia.  Patient states that she has been having shortness of breath which is getting progressively worse.  She has been gaining weight, feeling tired and fatigued.  Patient states that she usually weighs 194 pounds but she has been as high as 214 pounds given her shortness of breath she has been evaluated by medicine as well as GI.  Her endoscopies have only noted heartburn according to patient.  She has been treated for pneumonia at least 3 times with different antibiotics.  On further questioning since January 2024 patient states that she has been getting shortness of breath and chest pain with effort related activities.  She describes it as a burning-like sensation across the anterior precordium.  She has undergone stress test since then results reviewed.  Symptoms have been noticeable when she goes out to feed the horses, doing chores around the house, and improved with resting and relaxing.  She has been attributing all this to her underlying  anxiety and panic.  She has been under a lot of stress  The anginal chest pain is similar to what she experienced prior to her coronary interventions back in 2021.  Last episode of chest pain was last week.  She has been hospitalized since 01/24/2023.  Despite having CAD and carotid disease needing carotid endarterectomy she is not on statin therapy.  Patient states that she has phobia to medical therapy.  Her LDL is 141 mg/dL.  ALLERGIES: Allergies  Allergen Reactions   Prednisone Palpitations   Fentanyl Swelling and Other (See Comments)    Makes pt "hyper" feels she is "climbing the walls."  Last time she had a colonoscopy with Fentanyl and Versed she was awake the whole time.   Versed [Midazolam] Swelling and Anxiety    Makes pt "hyper" feels she is "climbing the walls."  Last time she had a colonoscopy with Fentanyl and Versed she was awake the whole time.   Vicodin [Hydrocodone-Acetaminophen] Anxiety    Extreme anxiety.  OK to take oxycodone.    PAST MEDICAL HISTORY: Past Medical History:  Diagnosis Date   Anxiety    Arthritis    low back and hip pain intermittent   Breast cancer (HCC) 06/02/07   r breast -surgery ,radiaology. chemotherapy   Carpal tunnel syndrome    right hand   Colon polyps    Complication of anesthesia    Fentanyl, Versed-makes extra hyper, bradycardia x 1 in PACU, [facility] (08/15/11 cardiology felt neostigmine may have resulted in AV nodal block)    Coronary artery disease    Depression    denies   Dysplasia of vulva    Hypertension    Palpitations    PSVT, s/p adenosine  08/04/16   S/P breast lumpectomy 07/04/07   R breast   S/P radiation therapy 2009    PAST SURGICAL HISTORY: Past Surgical History:  Procedure Laterality Date   APPENDECTOMY     age 42   CESAREAN SECTION     x 2   COLONOSCOPY W/ POLYPECTOMY     COLONOSCOPY WITH PROPOFOL N/A 04/27/2016   Procedure: COLONOSCOPY WITH PROPOFOL;  Surgeon: Charolett Bumpers, MD;  Location: [facility] ENDOSCOPY;   Service: Endoscopy;  Laterality: N/A;   DILATION AND CURETTAGE OF UTERUS     multiple   ENDARTERECTOMY Right 06/09/2019   ENDARTERECTOMY Right 06/09/2019   Procedure: ENDARTERECTOMY CAROTID RIGHT;  Surgeon: Sherren Kerns, MD;  Location: [facility] OR;  Service: Vascular;  Laterality: Right;   INTRAUTERINE DEVICE INSERTION     IUD REMOVAL     LEFT HEART CATH AND CORONARY ANGIOGRAPHY N/A 02/27/2020   Procedure: LEFT HEART CATH AND CORONARY ANGIOGRAPHY;  Surgeon: Yates Decamp, MD;  Location: [facility] INVASIVE CV LAB;  Service: Cardiovascular;  Laterality: N/A;   MASTECTOMY PARTIAL / LUMPECTOMY W/ AXILLARY LYMPHADENECTOMY Right    lumpectomy and lymph nodes removed   PATCH ANGIOPLASTY Right 06/09/2019   Procedure: Patch Angioplasty of right carotid artery using hemashield paltinum finesse patch;  Surgeon: Sherren Kerns, MD;  Location: [facility] OR;  Service: Vascular;  Laterality: Right;   TUBAL LIGATION     VULVA SURGERY     Multiple times for dysplasia    FAMILY HISTORY: The patient's family history includes Colon cancer (age of onset: 87) in her mother; Colon polyps in her brother; Diabetes type II in her brother; Heart disease in her father and paternal grandfather.   SOCIAL HISTORY:  The patient  reports that she quit smoking about 25 years ago. Her smoking use included cigarettes. She started smoking about 45 years ago. She has a 15 pack-year smoking history. She has never used smokeless tobacco. She reports current alcohol use. She reports that she does not use drugs.  MEDICATIONS: Current Outpatient Medications  Medication Instructions   acetaminophen (TYLENOL) 1,000 mg, Oral, Every 6 hours PRN   alprazolam (XANAX) 0.25-2 mg, Oral, See admin instructions, 1 mg (may take up to 2 mg) twice daily. May also take 0.25 mg during the day if needed for anxiety.   Ascorbic Acid (VITAMIN C PO) 2 tablets, Oral, Daily   aspirin 81 mg, Oral, Daily   Cholecalciferol (VITAMIN D3 PO) 1 capsule, Oral, Daily    Coenzyme Q10 (CO Q 10 PO) 1 capsule, Oral, Daily   GARLIC PO 1 capsule, Oral, Daily   losartan-hydrochlorothiazide (HYZAAR) 50-12.5 MG tablet 1 tablet, Oral, Daily   Multiple Vitamins-Minerals (ZINC PO) 1 tablet, Oral, Daily   nitroGLYCERIN (NITROSTAT) 0.4 mg, Sublingual, Every 5 min PRN   Omega-3 Fatty Acids (FISH OIL PO) 1 capsule, Oral, Daily   TURMERIC CURCUMIN PO 1 tablet, Oral, Daily    REVIEW OF SYSTEMS: Review of Systems  Cardiovascular:  Positive for chest pain (last episode last week) and leg swelling (present on arrival - now none). Negative for claudication, dyspnea on exertion, irregular heartbeat, near-syncope, orthopnea, palpitations, paroxysmal nocturnal dyspnea and syncope.  Respiratory:  Positive for shortness of breath (present on admission).   Hematologic/Lymphatic: Negative for bleeding problem.  Musculoskeletal:  Negative for muscle cramps and myalgias.  Neurological:  Negative for dizziness and light-headedness.  All other systems reviewed and are negative.   PHYSICAL EXAMINATION: PHYSICAL EXAM: Temp:  [97.9 F (36.6 C)-98.2 F (36.8 C)]  98.2 F (36.8 C) (08/07 0407) Pulse Rate:  [77-92] 92 (08/07 1053) Resp:  [14-20] 14 (08/07 1053) BP: (106-129)/(76-81) 129/78 (08/07 1053) SpO2:  [95 %-98 %] 98 % (08/07 1053) Weight:  [93.4 kg] 93.4 kg (08/07 0700)  Intake/Output:  Intake/Output Summary (Last 24 hours) at 01/27/2023 1134 Last data filed at 01/27/2023 0700 Gross per 24 hour  Intake 640 ml  Output 4850 ml  Net -4210 ml     Net IO Since Admission: -9,810 mL [01/27/23 1134]  Weights:     01/27/2023    7:00 AM 01/26/2023   10:15 AM 01/26/2023    3:43 AM  Last 3 Weights  Weight (lbs) 206 lb 209 lb 12.8 oz 210 lb 1.6 oz  Weight (kg) 93.441 kg 95.165 kg 95.301 kg     Physical Exam  Constitutional: No distress.  Age appropriate, hemodynamically stable.   Neck: No JVD present.  Cardiovascular: Normal rate, regular rhythm, S1 normal, S2 normal, intact  distal pulses and normal pulses. Exam reveals no gallop, no S3 and no S4.  Murmur heard. Harsh midsystolic murmur is present with a grade of 3/6 at the upper right sternal border. Pulses:      Carotid pulses are  on the left side with bruit. Right carotid endarterectomy scar is well-healed.  Pulmonary/Chest: Effort normal and breath sounds normal. No stridor. She has no wheezes. She has no rales.  Abdominal: Soft. Bowel sounds are normal. She exhibits no distension. There is no abdominal tenderness.  Musculoskeletal:        General: No edema.     Cervical back: Neck supple.  Neurological: She is alert and oriented to person, place, and time. She has intact cranial nerves (2-12).  Skin: Skin is warm and moist.   LAB RESULTS: Chemistry Recent Labs  Lab 01/23/23 2208 01/25/23 0418 01/26/23 0311 01/27/23 0205  NA 128* 131* 126* 129*  K 3.5 3.1* 3.9 4.1  CL 91* 92* 92* 95*  CO2 26 27 24 25   GLUCOSE 114* 116* 117* 108*  BUN 16 17 18 17   CREATININE 0.78 0.92 0.84 0.79  CALCIUM 9.7 8.8* 8.4* 8.5*  PROT 6.5  --   --   --   ALBUMIN 3.9  --   --   --   AST 26  --   --   --   ALT 27  --   --   --   ALKPHOS 47  --   --   --   BILITOT 0.8  --   --   --   GFRNONAA >60 >60 >60 >60  ANIONGAP 11 12 10 9     Hematology Recent Labs  Lab 01/23/23 2208 01/26/23 0311  WBC 7.2 7.8  RBC 4.01 4.45  HGB 12.8 13.9  HCT 36.7 41.1  MCV 91.5 92.4  MCH 31.9 31.2  MCHC 34.9 33.8  RDW 13.2 13.4  PLT 333 362   High Sensitivity Troponin:   Recent Labs  Lab 01/23/23 2208 01/23/23 2337  TROPONINIHS 35* 38*     Cardiac EnzymesNo results for input(s): "TROPONINI" in the last 168 hours. No results for input(s): "TROPIPOC" in the last 168 hours.  BNP Recent Labs  Lab 01/23/23 2208  BNP 462.3*    DDimer No results for input(s): "DDIMER" in the last 168 hours.  Hemoglobin A1c:  Lab Results  Component Value Date   HGBA1C 5.8 (H) 10/15/2017   MPG 114 10/06/2011   TSH No results for  input(s): "TSH" in the  last 8760 hours. Lipid Panel  Lab Results  Component Value Date   CHOL 220 (H) 12/30/2022   HDL 51 12/30/2022   LDLCALC 146 (H) 12/30/2022   TRIG 126 12/30/2022   CHOLHDL 4.2 01/19/2020   Drugs of Abuse  No results found for: "LABOPIA", "COCAINSCRNUR", "LABBENZ", "AMPHETMU", "THCU", "LABBARB"    CARDIAC DATABASE: EKG: 01/23/2023: Sinus rhythm, 97 bpm, left axis, left anterior fascicular block, subtle ST depressions in the high lateral leads cannot rule out ischemia, without underlying injury pattern, rare PVCs  Echocardiogram: December 2023: LVEF 55 to 60%, grade 1 diastolic impairment, normal LAP, mild to moderate aortic stenosis (peak velocity 2.44 m/s, peak gradient 24 mmHg, mean gradient 13 mmHg, AVA 1.22 cm, dimensional index 0.4) see report for additional details.  01/25/2023  1. Left ventricular ejection fraction, by estimation, is 30 to 35%. The left ventricle has moderately decreased function. The left ventricle demonstrates regional wall motion abnormalities (see scoring diagram/findings for description). There is mild left ventricular hypertrophy. Indeterminate diastolic filling due to E-A fusion. The entire septum and apex are hypokinetic.   2. Right ventricular systolic function is low normal. The right ventricular size is normal.   3. The mitral valve is grossly normal. No evidence of mitral valve regurgitation.   4. Native valve, moderate to severe calcification, reduced leaflet excursion, trace aortic regurgitation, mild to moderate aortic stenosis (peak velocity 2.24m/s, Peak Gradient 33 mmHg, Mean gradient 20 mmHg, AVA VTI 0.93cm2, DI 0.33, SVi 25cc (severity  of aortic stenosis is likely underestimated due to low flow - low gradient AS).   5. The inferior vena cava is normal in size with greater than 50% respiratory variability, suggesting right atrial pressure of 3 mmHg.   6. Rhythm strip during this exam demonstrates normal sinus rhythm.    Comparison(s): Prior study 05/25/2022: LVEF 55-60%, Grade I diastolic dysfunction, mild to moderate AS, see report for more details.    Stress Testing:  Exercise nuclear stress test 08/18/2022: Myocardial perfusion is abnormal due to a prominent gut uptake artifact in the basal infero-septal region. Ischemia in this region cannot be completely excluded.   Overall LV systolic function is normal without regional wall motion abnormalities. Stress LV EF: 55%.  Normal ECG stress. The patient exercised for 3 minutes and 54 seconds of a Bruce protocol, achieving approximately 5.69 METs &  87% MPHR. No chest pain. Stress terminated due to THR achieved. The baseline blood pressure was 170/100 mmHg and increased to 240/120 mmHg, which is a hypertensive response to exercise. No previous exam available for comparison. Low risk study.   Heart Catheterization: Left Heart Catheterization 02/27/20:  Normal LVEDP.  No pressure gradient across the aortic valve. RCA: Dominant vessel.  Mild diffuse disease. Left main: Large caliber vessel, mild calcification evident. LAD: Large caliber vessel.  Mild diffuse luminal irregularity. Circumflex: Small vessel, continues in the AV groove with a mid 80% stenosis. Ramus intermediate: It is a large vessel, gives origin to large lateral ramus branches, diffusely diseased, proximal segment has a 80 to 90% mildly calcified concentric stenosis.  Successful stenting with 3.0 x 18 mm resolute Onyx, distal end of the stent was in the diffusely diseased vessel and there was a type B dissection evident and the vessel was much larger than anticipated distally due to underfilling.  This was stented with a 3.0 x 12 mm resolute Onyx.  Overall stenosis reduced from diffusely diseased 90% to 0% with TIMI-3 to TIMI-3 flow maintained.    02/27/2020: Prox  RI 3.0 x 18 mm resolute Onyx, Mid 3.0 x 12 mm resolute Onyx  Carotid artery duplex 05/25/2022: There is a patent endarterectomy site  starting at the right carotid ICA-prox extending into the ICA-mid.  Duplex suggests stenosis in the right internal carotid artery (minimal). Duplex suggests stenosis in the left internal carotid artery (minimal). Antegrade right vertebral artery flow. Antegrade left vertebral artery flow. No significant change from 08/13/2020. Follow up study if clinically indicated.  Scheduled Meds:  ALPRAZolam  1 mg Oral TID   amLODipine  2.5 mg Oral Daily   aspirin  81 mg Oral Daily   busPIRone  5 mg Oral BID   enoxaparin (LOVENOX) injection  40 mg Subcutaneous Q24H   furosemide  40 mg Intravenous Daily   losartan  25 mg Oral Daily   omega-3 acid ethyl esters  1 g Oral Daily   pantoprazole  40 mg Oral Daily   potassium chloride  40 mEq Oral BID   rosuvastatin  20 mg Oral Daily   sodium chloride flush  3 mL Intravenous Q12H    Continuous Infusions:  sodium chloride      PRN Meds: sodium chloride, acetaminophen, nitroGLYCERIN, ondansetron (ZOFRAN) IV, mouth rinse, sodium chloride flush  IMPRESSION & RECOMMENDATIONS: Saranda Kizewski is a 67 y.o. Caucasian female whose past medical history and cardiovascular risk factors include: Coronary artery disease s/p PCI to the RCA 02/2020, history of PSVT in February 2028 terminated with adenosine, history of breast cancer status post radiation and chemotherapy in 2009, hypertension, aortic valve stenosis, hyperlipidemia, status post right carotid endarterectomy 06/09/2019, former smoker, extreme anxiety and multiple medication intolerances and phobia..  Impression:  Newly discovered heart failure with reduced EF Aortic stenosis Coronary artery disease prior PCI ramus intermedius with anginal chest pain Carotid artery disease - asymptomatic (history of carotid endarterectomy) History of breast cancer s/p radiation and chemotherapy. Hypertension with heart failure. Pure Hyperlipidemia chooses not to be on medical therapy Former  smoker. Anxiety. Phobia/intolerance to medical therapy  Plan:  Patient has been experiencing anginal chest pain since January 2024.  She has undergone a GI evaluation and has been treated for pneumonia with 3 different antibiotic courses without  any significant relief.  Presents to the emergency room department due to progressive shortness of breath and weight gain.  She was treated for new onset of congestive heart failure.  She has diuresed well Net IO Since Admission: -9,810 mL [01/27/23 1310]  Echocardiogram notes moderately reduced LVEF, regional wall motion abnormalities following the septum and apex, and at least mild to moderate aortic stenosis overall severity may be underappreciated due to low-flow low gradient AS.  When she overexerts she experiences burning-like sensation across the anterior precordium and improves with resting.  She does have sublingual nitroglycerin tablets but chooses not to use.  She has undergone MPI in February 2024. Her symptoms are similar to when she had PCI to the ramus intermedius.  With the new onset of congestive heart failure /cardiomyopathy, regional wall motion, and anginal equivalent chest pain discussed the role of left and right heart catheterization to further evaluate her coronary anatomy/stent patency and hemodynamic respectively.  Risks, benefits, and alternatives to left and right heart cath with both the patient at bedside and her daughter on the phone.  Her RN was present during discussion as well.  The procedure of left and right heart catheterization with possible intervention was explained to the patient and daughter Toni Amend) in detail.  The indication, alternatives, risks and  benefits were reviewed.  Complications include but not limited to bleeding, infection, vascular injury, stroke, myocardial infarction, arrhythmia (requiring medical or cardiopulmonary resuscitation), kidney injury (requiring short-term or long-term hemodialysis),  radiation-related injury in the case of prolonged fluoroscopy use, emergent cardiac surgery, temporary or permanent pacemaker, and death. The patient and daughter understands the risks of serious complication is 1-2 in 1000 with diagnostic cardiac cath and 1-2% or less with angioplasty/stenting.  The patient and daughter voices understanding and provides verbal feedback their questions and concerns are addressed to their satisfaction and patient and daughter wishes to proceed with coronary angiography with possible PCI.  Patient is requesting that she is asleep for the procedure.  I have reassured her that she will be given anxiolytic medications and pain control to keep her as comfortable as possible for catheterization.    Given the cardiomyopathy and HFrEF discussed the importance of guideline directed medical therapy.  She appears to be apprehensive but willing to uptitrate medical therapy given the clinical findings.  Discontinue amlodipine 2.5 mg p.o. daily.  Continue losartan. Start spironolactone 25 mg p.o. daily.  Transitioning to [facility] and initiation of SGLT2 inhibitors can be considered as outpatient.  With regards to lipid management patient states that she has not been on statin therapy in the past and chooses not to due to what she has read and others have told her.  Patient acknowledges  her LDL is 141 mg/dL.  Given her cardiomyopathy, CAD with prior coronary interventions, and carotid disease it is imperative that she reconsider lipid management.  Patient states that she wants to be on red yeast rice and I have informed here that it will not be enough. Her goal LDL should be 55mg /dL.  Patient and daughter to discuss this further.  Further recommendations to follow.   Total encounter time 69 minutes. *Total Encounter Time as defined by the Centers for Medicare and Medicaid Services includes, in addition to the face-to-face time of a patient visit (documented in the note above)  non-face-to-face time: obtaining and reviewing outside history, ordering and reviewing medications, tests or procedures, care coordination (communications with other health care professionals or caregivers) and documentation in the medical record.  Patient's questions and concerns were addressed to her satisfaction. She voices understanding of the instructions provided during this encounter.   This note was created using a voice recognition software as a result there may be grammatical errors inadvertently enclosed that do not reflect the nature of this encounter. Every attempt is made to correct such errors.  Delilah Shan [name]  Pager:  [phone] Office: [phone] 01/27/2023, 11:34 AM

## 2023-01-27 NOTE — Plan of Care (Signed)
  Problem: Clinical Measurements: Goal: Ability to maintain clinical measurements within normal limits will improve Outcome: Progressing Goal: Will remain free from infection Outcome: Progressing Goal: Diagnostic test results will improve Outcome: Progressing Goal: Respiratory complications will improve Outcome: Progressing Goal: Cardiovascular complication will be avoided Outcome: Progressing   Problem: Nutrition: Goal: Adequate nutrition will be maintained Outcome: Progressing   Problem: Coping: Goal: Level of anxiety will decrease Outcome: Progressing   Problem: Pain Managment: Goal: General experience of comfort will improve Outcome: Progressing   Problem: Safety: Goal: Ability to remain free from injury will improve Outcome: Progressing   

## 2023-01-27 NOTE — H&P (View-Only) (Signed)
CARDIOLOGY CONSULT NOTE  Patient ID: Madison West MRN: [id number] DOB/AGE: [birth year] 67 y.o.  Admit date: 01/23/2023 Attending physician: Joycelyn Das, MD Primary Physician:  [name], MD Outpatient Cardiology Provider: Dr. Jacinto Halim Inpatient Cardiologist: Tessa Lerner, DO, [facility]  Reason of consultation: Cardiomyopathy and CHF Referring physician: Joycelyn Das, MD  Chief complaint: Shortness of breath  HPI:  Madison West is a 67 y.o. Caucasian female who presents with a chief complaint of " shortness of breath." Her past medical history and cardiovascular risk factors include: Coronary artery disease s/p PCI to the RCA 02/2020, history of PSVT in February 2028 terminated with adenosine, history of breast cancer status post radiation and chemotherapy in 2009, hypertension, aortic valve stenosis, hyperlipidemia, status post right carotid endarterectomy 06/09/2019, former smoker, extreme anxiety and multiple medication intolerances and phobia.  Patient states that she has been having shortness of breath which is getting progressively worse.  She has been gaining weight, feeling tired and fatigued.  Patient states that she usually weighs 194 pounds but she has been as high as 214 pounds given her shortness of breath she has been evaluated by medicine as well as GI.  Her endoscopies have only noted heartburn according to patient.  She has been treated for pneumonia at least 3 times with different antibiotics.  On further questioning since January 2024 patient states that she has been getting shortness of breath and chest pain with effort related activities.  She describes it as a burning-like sensation across the anterior precordium.  She has undergone stress test since then results reviewed.  Symptoms have been noticeable when she goes out to feed the horses, doing chores around the house, and improved with resting and relaxing.  She has been attributing all this to her underlying  anxiety and panic.  She has been under a lot of stress  The anginal chest pain is similar to what she experienced prior to her coronary interventions back in 2021.  Last episode of chest pain was last week.  She has been hospitalized since 01/24/2023.  Despite having CAD and carotid disease needing carotid endarterectomy she is not on statin therapy.  Patient states that she has phobia to medical therapy.  Her LDL is 141 mg/dL.  ALLERGIES: Allergies  Allergen Reactions   Prednisone Palpitations   Fentanyl Swelling and Other (See Comments)    Makes pt "hyper" feels she is "climbing the walls."  Last time she had a colonoscopy with Fentanyl and Versed she was awake the whole time.   Versed [Midazolam] Swelling and Anxiety    Makes pt "hyper" feels she is "climbing the walls."  Last time she had a colonoscopy with Fentanyl and Versed she was awake the whole time.   Vicodin [Hydrocodone-Acetaminophen] Anxiety    Extreme anxiety.  OK to take oxycodone.    PAST MEDICAL HISTORY: Past Medical History:  Diagnosis Date   Anxiety    Arthritis    low back and hip pain intermittent   Breast cancer (HCC) 06/02/07   r breast -surgery ,radiaology. chemotherapy   Carpal tunnel syndrome    right hand   Colon polyps    Complication of anesthesia    Fentanyl, Versed-makes extra hyper, bradycardia x 1 in PACU, [facility] (08/15/11 cardiology felt neostigmine may have resulted in AV nodal block)    Coronary artery disease    Depression    denies   Dysplasia of vulva    Hypertension    Palpitations    PSVT, s/p adenosine  08/04/16   S/P breast lumpectomy 07/04/07   R breast   S/P radiation therapy 2009    PAST SURGICAL HISTORY: Past Surgical History:  Procedure Laterality Date   APPENDECTOMY     age 42   CESAREAN SECTION     x 2   COLONOSCOPY W/ POLYPECTOMY     COLONOSCOPY WITH PROPOFOL N/A 04/27/2016   Procedure: COLONOSCOPY WITH PROPOFOL;  Surgeon: Charolett Bumpers, MD;  Location: [facility] ENDOSCOPY;   Service: Endoscopy;  Laterality: N/A;   DILATION AND CURETTAGE OF UTERUS     multiple   ENDARTERECTOMY Right 06/09/2019   ENDARTERECTOMY Right 06/09/2019   Procedure: ENDARTERECTOMY CAROTID RIGHT;  Surgeon: Sherren Kerns, MD;  Location: [facility] OR;  Service: Vascular;  Laterality: Right;   INTRAUTERINE DEVICE INSERTION     IUD REMOVAL     LEFT HEART CATH AND CORONARY ANGIOGRAPHY N/A 02/27/2020   Procedure: LEFT HEART CATH AND CORONARY ANGIOGRAPHY;  Surgeon: Yates Decamp, MD;  Location: [facility] INVASIVE CV LAB;  Service: Cardiovascular;  Laterality: N/A;   MASTECTOMY PARTIAL / LUMPECTOMY W/ AXILLARY LYMPHADENECTOMY Right    lumpectomy and lymph nodes removed   PATCH ANGIOPLASTY Right 06/09/2019   Procedure: Patch Angioplasty of right carotid artery using hemashield paltinum finesse patch;  Surgeon: Sherren Kerns, MD;  Location: [facility] OR;  Service: Vascular;  Laterality: Right;   TUBAL LIGATION     VULVA SURGERY     Multiple times for dysplasia    FAMILY HISTORY: The patient's family history includes Colon cancer (age of onset: 87) in her mother; Colon polyps in her brother; Diabetes type II in her brother; Heart disease in her father and paternal grandfather.   SOCIAL HISTORY:  The patient  reports that she quit smoking about 25 years ago. Her smoking use included cigarettes. She started smoking about 45 years ago. She has a 15 pack-year smoking history. She has never used smokeless tobacco. She reports current alcohol use. She reports that she does not use drugs.  MEDICATIONS: Current Outpatient Medications  Medication Instructions   acetaminophen (TYLENOL) 1,000 mg, Oral, Every 6 hours PRN   alprazolam (XANAX) 0.25-2 mg, Oral, See admin instructions, 1 mg (may take up to 2 mg) twice daily. May also take 0.25 mg during the day if needed for anxiety.   Ascorbic Acid (VITAMIN C PO) 2 tablets, Oral, Daily   aspirin 81 mg, Oral, Daily   Cholecalciferol (VITAMIN D3 PO) 1 capsule, Oral, Daily    Coenzyme Q10 (CO Q 10 PO) 1 capsule, Oral, Daily   GARLIC PO 1 capsule, Oral, Daily   losartan-hydrochlorothiazide (HYZAAR) 50-12.5 MG tablet 1 tablet, Oral, Daily   Multiple Vitamins-Minerals (ZINC PO) 1 tablet, Oral, Daily   nitroGLYCERIN (NITROSTAT) 0.4 mg, Sublingual, Every 5 min PRN   Omega-3 Fatty Acids (FISH OIL PO) 1 capsule, Oral, Daily   TURMERIC CURCUMIN PO 1 tablet, Oral, Daily    REVIEW OF SYSTEMS: Review of Systems  Cardiovascular:  Positive for chest pain (last episode last week) and leg swelling (present on arrival - now none). Negative for claudication, dyspnea on exertion, irregular heartbeat, near-syncope, orthopnea, palpitations, paroxysmal nocturnal dyspnea and syncope.  Respiratory:  Positive for shortness of breath (present on admission).   Hematologic/Lymphatic: Negative for bleeding problem.  Musculoskeletal:  Negative for muscle cramps and myalgias.  Neurological:  Negative for dizziness and light-headedness.  All other systems reviewed and are negative.   PHYSICAL EXAMINATION: PHYSICAL EXAM: Temp:  [97.9 F (36.6 C)-98.2 F (36.8 C)]  98.2 F (36.8 C) (08/07 0407) Pulse Rate:  [77-92] 92 (08/07 1053) Resp:  [14-20] 14 (08/07 1053) BP: (106-129)/(76-81) 129/78 (08/07 1053) SpO2:  [95 %-98 %] 98 % (08/07 1053) Weight:  [93.4 kg] 93.4 kg (08/07 0700)  Intake/Output:  Intake/Output Summary (Last 24 hours) at 01/27/2023 1134 Last data filed at 01/27/2023 0700 Gross per 24 hour  Intake 640 ml  Output 4850 ml  Net -4210 ml     Net IO Since Admission: -9,810 mL [01/27/23 1134]  Weights:     01/27/2023    7:00 AM 01/26/2023   10:15 AM 01/26/2023    3:43 AM  Last 3 Weights  Weight (lbs) 206 lb 209 lb 12.8 oz 210 lb 1.6 oz  Weight (kg) 93.441 kg 95.165 kg 95.301 kg     Physical Exam  Constitutional: No distress.  Age appropriate, hemodynamically stable.   Neck: No JVD present.  Cardiovascular: Normal rate, regular rhythm, S1 normal, S2 normal, intact  distal pulses and normal pulses. Exam reveals no gallop, no S3 and no S4.  Murmur heard. Harsh midsystolic murmur is present with a grade of 3/6 at the upper right sternal border. Pulses:      Carotid pulses are  on the left side with bruit. Right carotid endarterectomy scar is well-healed.  Pulmonary/Chest: Effort normal and breath sounds normal. No stridor. She has no wheezes. She has no rales.  Abdominal: Soft. Bowel sounds are normal. She exhibits no distension. There is no abdominal tenderness.  Musculoskeletal:        General: No edema.     Cervical back: Neck supple.  Neurological: She is alert and oriented to person, place, and time. She has intact cranial nerves (2-12).  Skin: Skin is warm and moist.   LAB RESULTS: Chemistry Recent Labs  Lab 01/23/23 2208 01/25/23 0418 01/26/23 0311 01/27/23 0205  NA 128* 131* 126* 129*  K 3.5 3.1* 3.9 4.1  CL 91* 92* 92* 95*  CO2 26 27 24 25   GLUCOSE 114* 116* 117* 108*  BUN 16 17 18 17   CREATININE 0.78 0.92 0.84 0.79  CALCIUM 9.7 8.8* 8.4* 8.5*  PROT 6.5  --   --   --   ALBUMIN 3.9  --   --   --   AST 26  --   --   --   ALT 27  --   --   --   ALKPHOS 47  --   --   --   BILITOT 0.8  --   --   --   GFRNONAA >60 >60 >60 >60  ANIONGAP 11 12 10 9     Hematology Recent Labs  Lab 01/23/23 2208 01/26/23 0311  WBC 7.2 7.8  RBC 4.01 4.45  HGB 12.8 13.9  HCT 36.7 41.1  MCV 91.5 92.4  MCH 31.9 31.2  MCHC 34.9 33.8  RDW 13.2 13.4  PLT 333 362   High Sensitivity Troponin:   Recent Labs  Lab 01/23/23 2208 01/23/23 2337  TROPONINIHS 35* 38*     Cardiac EnzymesNo results for input(s): "TROPONINI" in the last 168 hours. No results for input(s): "TROPIPOC" in the last 168 hours.  BNP Recent Labs  Lab 01/23/23 2208  BNP 462.3*    DDimer No results for input(s): "DDIMER" in the last 168 hours.  Hemoglobin A1c:  Lab Results  Component Value Date   HGBA1C 5.8 (H) 10/15/2017   MPG 114 10/06/2011   TSH No results for  input(s): "TSH" in the  last 8760 hours. Lipid Panel  Lab Results  Component Value Date   CHOL 220 (H) 12/30/2022   HDL 51 12/30/2022   LDLCALC 146 (H) 12/30/2022   TRIG 126 12/30/2022   CHOLHDL 4.2 01/19/2020   Drugs of Abuse  No results found for: "LABOPIA", "COCAINSCRNUR", "LABBENZ", "AMPHETMU", "THCU", "LABBARB"    CARDIAC DATABASE: EKG: 01/23/2023: Sinus rhythm, 97 bpm, left axis, left anterior fascicular block, subtle ST depressions in the high lateral leads cannot rule out ischemia, without underlying injury pattern, rare PVCs  Echocardiogram: December 2023: LVEF 55 to 60%, grade 1 diastolic impairment, normal LAP, mild to moderate aortic stenosis (peak velocity 2.44 m/s, peak gradient 24 mmHg, mean gradient 13 mmHg, AVA 1.22 cm, dimensional index 0.4) see report for additional details.  01/25/2023  1. Left ventricular ejection fraction, by estimation, is 30 to 35%. The left ventricle has moderately decreased function. The left ventricle demonstrates regional wall motion abnormalities (see scoring diagram/findings for description). There is mild left ventricular hypertrophy. Indeterminate diastolic filling due to E-A fusion. The entire septum and apex are hypokinetic.   2. Right ventricular systolic function is low normal. The right ventricular size is normal.   3. The mitral valve is grossly normal. No evidence of mitral valve regurgitation.   4. Native valve, moderate to severe calcification, reduced leaflet excursion, trace aortic regurgitation, mild to moderate aortic stenosis (peak velocity 2.24m/s, Peak Gradient 33 mmHg, Mean gradient 20 mmHg, AVA VTI 0.93cm2, DI 0.33, SVi 25cc (severity  of aortic stenosis is likely underestimated due to low flow - low gradient AS).   5. The inferior vena cava is normal in size with greater than 50% respiratory variability, suggesting right atrial pressure of 3 mmHg.   6. Rhythm strip during this exam demonstrates normal sinus rhythm.    Comparison(s): Prior study 05/25/2022: LVEF 55-60%, Grade I diastolic dysfunction, mild to moderate AS, see report for more details.    Stress Testing:  Exercise nuclear stress test 08/18/2022: Myocardial perfusion is abnormal due to a prominent gut uptake artifact in the basal infero-septal region. Ischemia in this region cannot be completely excluded.   Overall LV systolic function is normal without regional wall motion abnormalities. Stress LV EF: 55%.  Normal ECG stress. The patient exercised for 3 minutes and 54 seconds of a Bruce protocol, achieving approximately 5.69 METs &  87% MPHR. No chest pain. Stress terminated due to THR achieved. The baseline blood pressure was 170/100 mmHg and increased to 240/120 mmHg, which is a hypertensive response to exercise. No previous exam available for comparison. Low risk study.   Heart Catheterization: Left Heart Catheterization 02/27/20:  Normal LVEDP.  No pressure gradient across the aortic valve. RCA: Dominant vessel.  Mild diffuse disease. Left main: Large caliber vessel, mild calcification evident. LAD: Large caliber vessel.  Mild diffuse luminal irregularity. Circumflex: Small vessel, continues in the AV groove with a mid 80% stenosis. Ramus intermediate: It is a large vessel, gives origin to large lateral ramus branches, diffusely diseased, proximal segment has a 80 to 90% mildly calcified concentric stenosis.  Successful stenting with 3.0 x 18 mm resolute Onyx, distal end of the stent was in the diffusely diseased vessel and there was a type B dissection evident and the vessel was much larger than anticipated distally due to underfilling.  This was stented with a 3.0 x 12 mm resolute Onyx.  Overall stenosis reduced from diffusely diseased 90% to 0% with TIMI-3 to TIMI-3 flow maintained.    02/27/2020: Prox  RI 3.0 x 18 mm resolute Onyx, Mid 3.0 x 12 mm resolute Onyx  Carotid artery duplex 05/25/2022: There is a patent endarterectomy site  starting at the right carotid ICA-prox extending into the ICA-mid.  Duplex suggests stenosis in the right internal carotid artery (minimal). Duplex suggests stenosis in the left internal carotid artery (minimal). Antegrade right vertebral artery flow. Antegrade left vertebral artery flow. No significant change from 08/13/2020. Follow up study if clinically indicated.  Scheduled Meds:  ALPRAZolam  1 mg Oral TID   amLODipine  2.5 mg Oral Daily   aspirin  81 mg Oral Daily   busPIRone  5 mg Oral BID   enoxaparin (LOVENOX) injection  40 mg Subcutaneous Q24H   furosemide  40 mg Intravenous Daily   losartan  25 mg Oral Daily   omega-3 acid ethyl esters  1 g Oral Daily   pantoprazole  40 mg Oral Daily   potassium chloride  40 mEq Oral BID   rosuvastatin  20 mg Oral Daily   sodium chloride flush  3 mL Intravenous Q12H    Continuous Infusions:  sodium chloride      PRN Meds: sodium chloride, acetaminophen, nitroGLYCERIN, ondansetron (ZOFRAN) IV, mouth rinse, sodium chloride flush  IMPRESSION & RECOMMENDATIONS: Madison West is a 67 y.o. Caucasian female whose past medical history and cardiovascular risk factors include: Coronary artery disease s/p PCI to the RCA 02/2020, history of PSVT in February 2028 terminated with adenosine, history of breast cancer status post radiation and chemotherapy in 2009, hypertension, aortic valve stenosis, hyperlipidemia, status post right carotid endarterectomy 06/09/2019, former smoker, extreme anxiety and multiple medication intolerances and phobia..  Impression:  Newly discovered heart failure with reduced EF Aortic stenosis Coronary artery disease prior PCI ramus intermedius with anginal chest pain Carotid artery disease - asymptomatic (history of carotid endarterectomy) History of breast cancer s/p radiation and chemotherapy. Hypertension with heart failure. Pure Hyperlipidemia chooses not to be on medical therapy Former  smoker. Anxiety. Phobia/intolerance to medical therapy  Plan:  Patient has been experiencing anginal chest pain since January 2024.  She has undergone a GI evaluation and has been treated for pneumonia with 3 different antibiotic courses without  any significant relief.  Presents to the emergency room department due to progressive shortness of breath and weight gain.  She was treated for new onset of congestive heart failure.  She has diuresed well Net IO Since Admission: -9,810 mL [01/27/23 1310]  Echocardiogram notes moderately reduced LVEF, regional wall motion abnormalities following the septum and apex, and at least mild to moderate aortic stenosis overall severity may be underappreciated due to low-flow low gradient AS.  When she overexerts she experiences burning-like sensation across the anterior precordium and improves with resting.  She does have sublingual nitroglycerin tablets but chooses not to use.  She has undergone MPI in February 2024. Her symptoms are similar to when she had PCI to the ramus intermedius.  With the new onset of congestive heart failure /cardiomyopathy, regional wall motion, and anginal equivalent chest pain discussed the role of left and right heart catheterization to further evaluate her coronary anatomy/stent patency and hemodynamic respectively.  Risks, benefits, and alternatives to left and right heart cath with both the patient at bedside and her daughter on the phone.  Her RN was present during discussion as well.  The procedure of left and right heart catheterization with possible intervention was explained to the patient and daughter Toni Amend) in detail.  The indication, alternatives, risks and  benefits were reviewed.  Complications include but not limited to bleeding, infection, vascular injury, stroke, myocardial infarction, arrhythmia (requiring medical or cardiopulmonary resuscitation), kidney injury (requiring short-term or long-term hemodialysis),  radiation-related injury in the case of prolonged fluoroscopy use, emergent cardiac surgery, temporary or permanent pacemaker, and death. The patient and daughter understands the risks of serious complication is 1-2 in 1000 with diagnostic cardiac cath and 1-2% or less with angioplasty/stenting.  The patient and daughter voices understanding and provides verbal feedback their questions and concerns are addressed to their satisfaction and patient and daughter wishes to proceed with coronary angiography with possible PCI.  Patient is requesting that she is asleep for the procedure.  I have reassured her that she will be given anxiolytic medications and pain control to keep her as comfortable as possible for catheterization.    Given the cardiomyopathy and HFrEF discussed the importance of guideline directed medical therapy.  She appears to be apprehensive but willing to uptitrate medical therapy given the clinical findings.  Discontinue amlodipine 2.5 mg p.o. daily.  Continue losartan. Start spironolactone 25 mg p.o. daily.  Transitioning to [facility] and initiation of SGLT2 inhibitors can be considered as outpatient.  With regards to lipid management patient states that she has not been on statin therapy in the past and chooses not to due to what she has read and others have told her.  Patient acknowledges  her LDL is 141 mg/dL.  Given her cardiomyopathy, CAD with prior coronary interventions, and carotid disease it is imperative that she reconsider lipid management.  Patient states that she wants to be on red yeast rice and I have informed here that it will not be enough. Her goal LDL should be 55mg /dL.  Patient and daughter to discuss this further.  Further recommendations to follow.   Total encounter time 69 minutes. *Total Encounter Time as defined by the Centers for Medicare and Medicaid Services includes, in addition to the face-to-face time of a patient visit (documented in the note above)  non-face-to-face time: obtaining and reviewing outside history, ordering and reviewing medications, tests or procedures, care coordination (communications with other health care professionals or caregivers) and documentation in the medical record.  Patient's questions and concerns were addressed to her satisfaction. She voices understanding of the instructions provided during this encounter.   This note was created using a voice recognition software as a result there may be grammatical errors inadvertently enclosed that do not reflect the nature of this encounter. Every attempt is made to correct such errors.  Delilah Shan [name]  Pager:  [phone] Office: [phone] 01/27/2023, 11:34 AM

## 2023-01-27 NOTE — Progress Notes (Signed)
PROGRESS NOTE    Madison West  ZOX:[id number] DOB: Aug 09, 1955 DOA: 01/23/2023 PCP: [name], MD    Brief Narrative:   Madison West, is a 67 years old female with  past  medical  history of PSVT, anxiety/depression, breast cancer status post lumpectomy, chemo and radiation in 2009, hypertension, hyperlipidemia, right carotid artery stenosis status post carotid endarterectomy in 2020, CAD status post stenting, mild to moderate aortic stenosis, GERD presented to the ED with complaints of shortness of breath, weight gain, and generalized weakness.  Patient was slightly tachycardic on arrival but afebrile.  In the ED, patient was hypoxic with pulse ox of 90s on room air.  Labs significant for sodium 128, chloride 91, creatinine 0.7, BNP 462, troponin 35> 38, COVID/influenza/RSV PCR negative.  Patient has been non-adherent to medical recommendations.  Initial labs showed hyponatremia with sodium of 128, BNP 462, Troponin  35 to 38. CTA chest - no PE, bronchial wall thickening, emphysema, CAD, no pulmonary edema. CXR -did not see any infiltrate., EKG showed PVC, LAE, LAFB minimal ST elevation.  Patient was then admitted to the hospital for further evaluation and treatment.  Assessment and Plan:  Acute systolic CHF History of CAD with stents. Seen by Dr. Jacinto Halim as outpatient.  Recent cardiac testing under direction of Dr. Jacinto Halim - w/o acute disease.  Last 2D echo 05/25/22 with grade I DD and EF 55-60%.  Repeat 2D echocardiogram this admission with LV ejection fraction of 30 to 35% with regional wall motion abnormality.  Spoke with Dr. [facility] for consultation and at this time plan for cardiac catheterization.  Marland Kitchen  BNP mildly elevated at 462.Marland Kitchen Troponin x 2 at 35 and flat.  Patient has received IV Lasix during hospitalization.  Continue losartan, spironolactone.  Of note, patient was treated 3 times for pneumonia in the past. CT angiogram of chest showed no evidence of pulmonary embolism but  mild bronchial wall thickening.  Previous stress test from 08/18/2022 with LV ejection fraction of 55%.  Continue intake and output charting Daily weights, fluid restriction.Marland Kitchen    Anxiety disorder Follows up with psychiatry - [facility])  Buspar has been prescribed which she hasn't taken. Continue Xanax  taper as BuSpar takes affect. Would recommend outpatient follow-up with psychiatry.     Spoke with the patient regarding deep breathing exercises and relaxation.          Pure hypercholesterolemia Was on red yeast rice.  Crestor has been initiated at this time due to CAD  Hypokalemia.  Improved.  Latest potassium of 4.1.  Mild hyponatremia likely secondary to congestive heart failure and diuretics.  Closely monitor.  Sodium level at 129 today from 126 yesterday.  Continue to monitor.    Hypertension Patient is on losartan/Hct , continue losartan.  Was supposed to be on amlodipine which has been reinitiated.  Discontinue HCTZ.   DVT prophylaxis: enoxaparin (LOVENOX) injection 40 mg Start: 01/24/23 1200   Code Status:     Code Status: Full Code  Disposition:  Home in 1 to 2 days.  Plan for cardiac catheterization.  Status is: Inpatient  Remains inpatient appropriate because: Decompensated heart failure, anxiety, plan for cardiac catheterization   Family Communication:  I spoke with the patient's daughter on the phone and updated her about the clinical condition of the patient.  Consultants:  Cardiology  Procedures:  Plan for cardiac catheterization.  Antimicrobials:  None  Anti-infectives (From admission, onward)    None      Subjective:  Today, patient was seen and examined at bedside.  Appears very anxious.  Discussed about low ejection fraction on 2D echocardiogram and cardiology consultation.  Denied any chest pain, dizziness, lightheadedness or obvious shortness of breath.  Patient states that she is scared to go home.  Objective: Vitals:   01/27/23 0439 01/27/23  0700 01/27/23 1053 01/27/23 1324  BP: 124/77  129/78 108/69  Pulse: 77  92 89  Resp: 18  14 18   Temp:    98 F (36.7 C)  TempSrc:    Oral  SpO2: 98%  98% 94%  Weight:  93.4 kg    Height:        Intake/Output Summary (Last 24 hours) at 01/27/2023 1421 Last data filed at 01/27/2023 1400 Gross per 24 hour  Intake 640 ml  Output 4750 ml  Net -4110 ml   Filed Weights   01/26/23 0343 01/26/23 1015 01/27/23 0700  Weight: 95.3 kg 95.2 kg 93.4 kg    Physical Examination: Body mass index is 31.32 kg/m.   General: Obese built, not in obvious distress on nasal cannula oxygen, anxious HENT:   No scleral pallor or icterus noted. Oral mucosa is moist.  Chest: Diminished breath sounds bilaterally.   CVS: S1 &S2 heard. No murmur.  Regular rate and rhythm. Abdomen: Soft, nontender, nondistended.  Bowel sounds are heard.   Extremities: No cyanosis, clubbing ,peripheral pulses are palpable. Psych: Alert, awake and oriented, anxious++ CNS:  No cranial nerve deficits.  Power equal in all extremities.   Skin: Warm and dry.  No rashes noted.  Data Reviewed:   CBC: Recent Labs  Lab 01/23/23 2208 01/26/23 0311  WBC 7.2 7.8  NEUTROABS 5.0  --   HGB 12.8 13.9  HCT 36.7 41.1  MCV 91.5 92.4  PLT 333 362    Basic Metabolic Panel: Recent Labs  Lab 01/23/23 2208 01/25/23 0418 01/26/23 0311 01/27/23 0205  NA 128* 131* 126* 129*  K 3.5 3.1* 3.9 4.1  CL 91* 92* 92* 95*  CO2 26 27 24 25   GLUCOSE 114* 116* 117* 108*  BUN 16 17 18 17   CREATININE 0.78 0.92 0.84 0.79  CALCIUM 9.7 8.8* 8.4* 8.5*  MG  --   --  2.3 2.4    Liver Function Tests: Recent Labs  Lab 01/23/23 2208  AST 26  ALT 27  ALKPHOS 47  BILITOT 0.8  PROT 6.5  ALBUMIN 3.9     Radiology Studies: ECHOCARDIOGRAM COMPLETE  Result Date: 01/27/2023    ECHOCARDIOGRAM REPORT   Patient Name:   Madison West Date of Exam: 01/25/2023 Medical Rec #:  [id number]           Height:       68.0 in Accession #:    [id number]           Weight:       206.3 lb Date of Birth:  July 26, 1955           BSA:          2.071 m Patient Age:    67 years            BP:           111/78 mmHg Patient Gender: F                   HR:           92 bpm. Exam Location:  Inpatient Procedure: 2D Echo, Color Doppler and Cardiac Doppler Indications:  acute systolic chf  History:         Patient has prior history of Echocardiogram examinations, most                  recent 03/08/2018. CHF, CAD, history of breast cancer,                  Signs/Symptoms:Dyspnea; Risk Factors:Hypertension.  Sonographer:     Delcie Roch RDCS Referring Phys:  [phone] MICHAEL E NORINS Diagnosing Phys: Tessa Lerner DO IMPRESSIONS  1. Left ventricular ejection fraction, by estimation, is 30 to 35%. The left ventricle has moderately decreased function. The left ventricle demonstrates regional wall motion abnormalities (see scoring diagram/findings for description). There is mild left ventricular hypertrophy. Indeterminate diastolic filling due to E-A fusion. The entire septum and apex are hypokinetic.  2. Right ventricular systolic function is low normal. The right ventricular size is normal.  3. The mitral valve is grossly normal. No evidence of mitral valve regurgitation.  4. Native valve, moderate to severe calcification, reduced leaflet excursion, trace aortic regurgitation, mild to moderate aortic stenosis (peak velocity 2.34m/s, Peak Gradient 33 mmHg, Mean gradient 20 mmHg, AVA VTI 0.93cm2, DI 0.33, SVi 25cc (severity  of aortic stenosis is likely underestimated due to low flow - low gradient AS).  5. The inferior vena cava is normal in size with greater than 50% respiratory variability, suggesting right atrial pressure of 3 mmHg.  6. Rhythm strip during this exam demonstrates normal sinus rhythm. Comparison(s): Prior study 05/25/2022: LVEF 55-60%, Grade I diastolic dysfunction, mild to moderate AS, see report for more details. FINDINGS  Left Ventricle: Left ventricular ejection  fraction, by estimation, is 30 to 35%. The left ventricle has moderately decreased function. The left ventricle demonstrates regional wall motion abnormalities. The left ventricular internal cavity size was normal in size. There is mild left ventricular hypertrophy. Indeterminate diastolic filling due to E-A fusion. The entire septum and apex are hypokinetic.  LV Wall Scoring: The entire septum and apex are hypokinetic. Right Ventricle: The right ventricular size is normal. No increase in right ventricular wall thickness. Right ventricular systolic function is low normal. Left Atrium: Left atrial size was normal in size. Right Atrium: Right atrial size was normal in size. Pericardium: There is no evidence of pericardial effusion. Mitral Valve: The mitral valve is grossly normal. No evidence of mitral valve regurgitation. Tricuspid Valve: The tricuspid valve is normal in structure. Tricuspid valve regurgitation is not demonstrated. No evidence of tricuspid stenosis. Aortic Valve: Native valve, moderate to severe calcification, reduced leaflet excursion, trace aortic regurgitation, mild to moderate aortic stenosis (peak velocity 2.52m/s, Peak Gradient 33 mmHg, Mean gradient 20 mmHg, AVA VTI 0.93cm2, DI 0.33, SVi 25cc  (severity of aortic stenosis is likely underestimated due to low flow - low gradient AS). Aortic valve mean gradient measures 20.0 mmHg. Aortic valve peak gradient measures 33.4 mmHg. Aortic valve area, by VTI measures 0.93 cm. Pulmonic Valve: The pulmonic valve was not well visualized. Pulmonic valve regurgitation is not visualized. No evidence of pulmonic stenosis. Aorta: The aortic root is normal in size and structure and the ascending aorta was not well visualized. Venous: The inferior vena cava is normal in size with greater than 50% respiratory variability, suggesting right atrial pressure of 3 mmHg. IAS/Shunts: The interatrial septum was not well visualized. EKG: Rhythm strip during this exam  demonstrates normal sinus rhythm.  LEFT VENTRICLE PLAX 2D LVIDd:         4.70 cm  Diastology LVIDs:         4.10 cm      LV e' medial:  5.00 cm/s LV PW:         1.20 cm      LV e' lateral: 3.92 cm/s LV IVS:        1.20 cm LVOT diam:     1.90 cm LV SV:         52 LV SV Index:   25 LVOT Area:     2.84 cm  LV Volumes (MOD) LV vol d, MOD A2C: 122.0 ml LV vol s, MOD A2C: 88.3 ml LV SV MOD A2C:     33.7 ml RIGHT VENTRICLE            IVC RV Basal diam:  2.40 cm    IVC diam: 1.60 cm RV S prime:     9.79 cm/s TAPSE (M-mode): 2.0 cm LEFT ATRIUM             Index        RIGHT ATRIUM           Index LA diam:        3.70 cm 1.79 cm/m   RA Area:     11.70 cm LA Vol (A2C):   67.9 ml 32.79 ml/m  RA Volume:   24.80 ml  11.98 ml/m LA Vol (A4C):   48.6 ml 23.47 ml/m LA Biplane Vol: 61.2 ml 29.55 ml/m  AORTIC VALVE AV Area (Vmax):    1.02 cm AV Area (Vmean):   0.93 cm AV Area (VTI):     0.93 cm AV Vmax:           289.00 cm/s AV Vmean:          212.000 cm/s AV VTI:            0.557 m AV Peak Grad:      33.4 mmHg AV Mean Grad:      20.0 mmHg LVOT Vmax:         104.30 cm/s LVOT Vmean:        69.500 cm/s LVOT VTI:          0.182 m LVOT/AV VTI ratio: 0.33  AORTA Ao Root diam: 2.90 cm  SHUNTS Systemic VTI:  0.18 m Systemic Diam: 1.90 cm Sunit Tolia DO Electronically signed by Tessa Lerner DO Signature Date/Time: 01/27/2023/12:12:40 AM    Final       LOS: 3 days    Madison Das, MD [facility] Available via Epic secure chat 7am-7pm After these hours, please refer to coverage provider listed on [email or web address] 01/27/2023, 2:21 PM

## 2023-01-28 ENCOUNTER — Inpatient Hospital Stay (HOSPITAL_COMMUNITY): Payer: Medicare Other

## 2023-01-28 ENCOUNTER — Encounter (HOSPITAL_COMMUNITY): Payer: Self-pay | Admitting: Cardiology

## 2023-01-28 DIAGNOSIS — I255 Ischemic cardiomyopathy: Secondary | ICD-10-CM

## 2023-01-28 DIAGNOSIS — I10 Essential (primary) hypertension: Secondary | ICD-10-CM | POA: Diagnosis not present

## 2023-01-28 DIAGNOSIS — I350 Nonrheumatic aortic (valve) stenosis: Secondary | ICD-10-CM

## 2023-01-28 DIAGNOSIS — I509 Heart failure, unspecified: Secondary | ICD-10-CM

## 2023-01-28 DIAGNOSIS — F411 Generalized anxiety disorder: Secondary | ICD-10-CM | POA: Diagnosis not present

## 2023-01-28 DIAGNOSIS — E7800 Pure hypercholesterolemia, unspecified: Secondary | ICD-10-CM | POA: Diagnosis not present

## 2023-01-28 DIAGNOSIS — I5031 Acute diastolic (congestive) heart failure: Secondary | ICD-10-CM | POA: Diagnosis not present

## 2023-01-28 LAB — BASIC METABOLIC PANEL
Anion gap: 8 (ref 5–15)
BUN: 16 mg/dL (ref 8–23)
CO2: 25 mmol/L (ref 22–32)
Calcium: 9 mg/dL (ref 8.9–10.3)
Chloride: 98 mmol/L (ref 98–111)
Creatinine, Ser: 0.67 mg/dL (ref 0.44–1.00)
GFR, Estimated: >60 mL/min (ref >60)
Glucose, Bld: 113 mg/dL — ABNORMAL HIGH (ref 70–99)
Potassium: 4.2 mmol/L (ref 3.5–5.1)
Sodium: 131 mmol/L — ABNORMAL LOW (ref 135–145)

## 2023-01-28 MED ORDER — ASPIRIN 81 MG PO CHEW
81.0000 mg | CHEWABLE_TABLET | ORAL | Status: AC
  Administered 2023-01-29: 81 mg via ORAL
  Filled 2023-01-28: qty 1

## 2023-01-28 MED ORDER — POTASSIUM CHLORIDE CRYS ER 20 MEQ PO TBCR
40.0000 meq | EXTENDED_RELEASE_TABLET | Freq: Two times a day (BID) | ORAL | Status: DC
  Administered 2023-01-28 – 2023-01-30 (×5): 40 meq via ORAL
  Filled 2023-01-28 (×5): qty 2

## 2023-01-28 MED ORDER — BUTALBITAL-APAP-CAFFEINE 50-325-40 MG PO TABS: 1.0000 | ORAL_TABLET | Freq: Once | ORAL | Status: DC

## 2023-01-28 MED ORDER — SODIUM CHLORIDE 0.9 % WEIGHT BASED INFUSION: 3.0000 mL/kg/h | INTRAVENOUS | Status: AC

## 2023-01-28 MED ORDER — METOPROLOL TARTRATE 50 MG PO TABS
50.0000 mg | ORAL_TABLET | Freq: Once | ORAL | Status: AC
  Administered 2023-01-28: 50 mg via ORAL
  Filled 2023-01-28: qty 1

## 2023-01-28 MED ORDER — IOHEXOL 350 MG/ML SOLN
100.0000 mL | Freq: Once | INTRAVENOUS | Status: AC | PRN
  Administered 2023-01-28: 100 mL via INTRAVENOUS

## 2023-01-28 MED ORDER — SODIUM CHLORIDE 0.9 % WEIGHT BASED INFUSION: 1.0000 mL/kg/h | INTRAVENOUS | Status: DC

## 2023-01-28 NOTE — Progress Notes (Addendum)
PROGRESS NOTE    Madison West  WNU:[id number] DOB: [birth year] DOA: 01/23/2023 PCP: [name], MD    Brief Narrative:   [name], is a 67 years old female with  past  medical  history of PSVT, anxiety/depression, breast cancer status post lumpectomy, chemo and radiation in 2009, hypertension, hyperlipidemia, right carotid artery stenosis status post carotid endarterectomy in 2020, CAD status post stenting, mild to moderate aortic stenosis, GERD presented to the ED with complaints of shortness of breath, weight gain, and generalized weakness.  Patient was slightly tachycardic on arrival but afebrile.  In the ED, patient was hypoxic with pulse ox of 90s on room air.  Labs significant for sodium 128, chloride 91, creatinine 0.7, BNP 462, troponin 35> 38, COVID/influenza/RSV PCR negative.  Patient has been non-adherent to medical recommendations.  Initial labs showed hyponatremia with sodium of 128, BNP 462, Troponin  35 to 38. CTA chest - no PE, bronchial wall thickening, emphysema, CAD, no pulmonary edema. CXR -did not see any infiltrate., EKG showed PVC, LAE, LAFB minimal ST elevation.  Patient was then admitted to the hospital for further evaluation and treatment.  Assessment and Plan:  Acute systolic CHF History of CAD with stents. Severe aortic stenosis.  BNP mildly elevated at 462.Marland Kitchen Troponin x 2 at 35 and flat.  Of note, patient was treated 3 times for pneumonia in the past. CT angiogram of chest showed no evidence of pulmonary embolism but mild bronchial wall thickening.  Previous stress test from 08/18/2022 with LV ejection fraction of 55%. Patient has received IV Lasix during hospitalization.  2D echocardiogram this admission with LV ejection fraction of 30 to 35% with regional wall motion abnormality.  Cardiology on board and patient underwent cardiac catheterization on 01/27/2023 with findings of a mid LAD 95% lesion.  At this time, cardiothoracic surgery has been consulted due to  severe aortic stenosis and possible need for CABG versus PCI.  Patient will undergo CT angiogram of the chest, CT coronaries and CT angio of the abdomen and pelvis prior to deciding on further course of treatment.  Anxiety disorder Follows up with psychiatry - [facility])  Buspar has been prescribed which she hasn't taken. Continue Xanax  taper as BuSpar takes affect. Would recommend outpatient follow-up with psychiatry.    Encouraged on  deep breathing exercises and relaxation.          Pure hypercholesterolemia Was on red yeast rice.  Crestor has been initiated at this time due to CAD  Hypokalemia.  Improved.  Latest potassium of 4.2.  Mild hyponatremia likely secondary to congestive heart failure and diuretics.  Closely monitor.  Sodium level at 131 today.  Continue to monitor.    Hypertension Patient is on losartan/Hct , continue losartan and amlodipine.  Discontinue HCTZ.   DVT prophylaxis: SCD's Start: 01/27/23 1832 enoxaparin (LOVENOX) injection 40 mg Start: 01/24/23 1200   Code Status:     Code Status: Full Code  Disposition:  Home likely in 1 to 2 days.  Status is: Inpatient  Remains inpatient appropriate because: Decompensated heart failure, anxiety, status post cardiac catheterization with LAD stenosis, CT surgery opinion.   Family Communication:  I spoke with the patient's daughter on the phone and updated her about the clinical condition of the patient on 01/27/2023.  Consultants:  Cardiology  Procedures:  Cardiac catheterization 01/27/2023.  Antimicrobials:  None  Anti-infectives (From admission, onward)    None      Subjective:  Today, patient was seen and  examined at bedside.  Appears anxious about her cardiac condition.  Denies any chest pain dizziness lightheadedness.  Has mild chest discomfort.  Feels anxious.  Objective: Vitals:   01/27/23 1709 01/27/23 1735 01/27/23 2132 01/28/23 0635  BP:   110/75 (!) 142/98  Pulse:   85 88  Resp:   14 14   Temp:   97.7 F (36.5 C) 98 F (36.7 C)  TempSrc:   Oral Oral  SpO2: 95% (!) 85% 97% 97%  Weight:    93.8 kg  Height:        Intake/Output Summary (Last 24 hours) at 01/28/2023 1321 Last data filed at 01/27/2023 2300 Gross per 24 hour  Intake 600 ml  Output 1000 ml  Net -400 ml   Filed Weights   01/26/23 1015 01/27/23 0700 01/28/23 0635  Weight: 95.2 kg 93.4 kg 93.8 kg    Physical Examination: Body mass index is 31.43 kg/m.   General: Obese built, not in obvious distress on nasal cannula oxygen, anxious HENT:   No scleral pallor or icterus noted. Oral mucosa is moist.  Chest: Diminished breath sounds bilaterally.  No obvious wheezes or crackles noted.   CVS: S1 &S2 heard.  Systolic murmur noted.  Regular rate and rhythm. Abdomen: Soft, nontender, nondistended.  Bowel sounds are heard.   Extremities: No cyanosis, clubbing ,peripheral pulses are palpable. Psych: Alert, awake and oriented, anxious+ CNS:  No cranial nerve deficits.  Power equal in all extremities.   Skin: Warm and dry.  No rashes noted.  Data Reviewed:   CBC: Recent Labs  Lab 01/23/23 2208 01/26/23 0311 01/27/23 1737 01/27/23 2321  WBC 7.2 7.8  --  6.3  NEUTROABS 5.0  --   --   --   HGB 12.8 13.9 14.6  15.0 14.0  HCT 36.7 41.1 43.0  44.0 42.5  MCV 91.5 92.4  --  95.1  PLT 333 362  --  413*    Basic Metabolic Panel: Recent Labs  Lab 01/25/23 0418 01/26/23 0311 01/27/23 0205 01/27/23 1737 01/27/23 2321 01/28/23 0935  NA 131* 126* 129* 133*  133* 131* 131*  K 3.1* 3.9 4.1 4.4  4.5 5.2* 4.2  CL 92* 92* 95*  --  100 98  CO2 27 24 25   --  20* 25  GLUCOSE 116* 117* 108*  --  135* 113*  BUN 17 18 17   --  18 16  CREATININE 0.92 0.84 0.79  --  0.70 0.67  CALCIUM 8.8* 8.4* 8.5*  --  9.0 9.0  MG  --  2.3 2.4  --  2.1  --     Liver Function Tests: Recent Labs  Lab 01/23/23 2208  AST 26  ALT 27  ALKPHOS 47  BILITOT 0.8  PROT 6.5  ALBUMIN 3.9     Radiology Studies: CARDIAC  CATHETERIZATION  Result Date: 01/27/2023 Images from the original result were not included. LM: Normal LAD: 95% mid LAD stenosis with mild calcification and moderate tortuosity (New since 2021)          40% downstream lesion in mid LAD (New since 2021)          Otherwise mild diffuse disease Lcx: Mid diffuse 50% disease (Previously deemed 80% in 2021) Ramus: Patent stent. No restenosis RCA: Mid eccentric 40% disease. Rest mild diffuse disease RA: 4 mmHg RV: 31/2 mmHg PA: 23/10 mmHg, mPAP 14 mmHg PCW: 4 mmHg LV: 132/0 mmHg Ao: 101/59 mmHg CO: 5.5 L/min CI: 2.7 L/min/m2 AoV mean PG  29 mmHg AVA 1.0 cm2, AVAi 0.5 cm2/.m2 Exertional angina and dyspnea Single vessel obstructive disease Compensated ischemic or valvular cardiomyopathy (EF 30-35% with akinetic apex and septum on echocardiogram) Probable low flow low gradient severe AS GDMT for HFrEF likely limited by low blood pressure in the setting of severe AS Will discuss with multidisciplinary heart team re: GDMT for HFrEF + PCI and TAVR vs CABG (LIMA-LAD) + AVR Elder Negus, MD Pager: [phone] Office: [phone]      LOS: 4 days    Joycelyn Das, MD [facility] Available via Epic secure chat 7am-7pm After these hours, please refer to coverage provider listed on [email or web address] 01/28/2023, 1:21 PM

## 2023-01-28 NOTE — Consult Note (Signed)
[address]       [place] [address]             [phone]           [facility] Medical Record #[id number] Date of Birth: 11-17-1955  No ref. provider found [name], MD  Chief Complaint:    Chief Complaint  Patient presents with   Shortness of Breath    History of Present Illness:     Pt is a 67 yo female with known CAD sp stenting in past who over the past 6 months has been having issues with chest burning and SOB. Pt has had work up in December with echo with normal LV function and mild AS. Pt also underwent stress test without indication of ischemia and was treated with several courses of antibiotics for pneumonias. Pt however continued to have increasing SOB with inability to talk without becoming SOB and came to ER. By echo now with depressed LV function and mean gradient of aortic valve of and evidence for low flow low gradient AS with calcified valve visually. Pt also now with tight mid LAD lesion by cath with open stent in OM. Pt with PAD sp CEA and also with hx of breast cancer with chemo and RT to chest. She has significant anxiety and has not been consistent with medications secondary to concern over their effectiveness      Past Medical History:  Diagnosis Date   Anxiety    Arthritis    low back and hip pain intermittent   Breast cancer (HCC) 06/02/07   r breast -surgery ,radiaology. chemotherapy   Carpal tunnel syndrome    right hand   Colon polyps    Complication of anesthesia    Fentanyl, Versed-makes extra hyper, bradycardia x 1 in PACU, [facility] (08/15/11 cardiology felt neostigmine may have resulted in AV nodal block)    Coronary artery disease    Depression    denies   Dysplasia of vulva    Hypertension    Palpitations    PSVT, s/p adenosine 08/04/16   S/P breast lumpectomy 07/04/07   R breast   S/P radiation therapy 2009    Past Surgical History:  Procedure Laterality Date   APPENDECTOMY      age 1   CESAREAN SECTION     x 2   COLONOSCOPY W/ POLYPECTOMY     COLONOSCOPY WITH PROPOFOL N/A 04/27/2016   Procedure: COLONOSCOPY WITH PROPOFOL;  Surgeon: Charolett Bumpers, MD;  Location: [facility];  Service: Endoscopy;  Laterality: N/A;   DILATION AND CURETTAGE OF UTERUS     multiple   ENDARTERECTOMY Right 06/09/2019   ENDARTERECTOMY Right 06/09/2019   Procedure: ENDARTERECTOMY CAROTID RIGHT;  Surgeon: Sherren Kerns, MD;  Location: [facility] OR;  Service: Vascular;  Laterality: Right;   INTRAUTERINE DEVICE INSERTION     IUD REMOVAL     LEFT HEART CATH AND CORONARY ANGIOGRAPHY N/A 02/27/2020   Procedure: LEFT HEART CATH AND CORONARY ANGIOGRAPHY;  Surgeon: Yates Decamp, MD;  Location: [facility];  Service: Cardiovascular;  Laterality: N/A;   MASTECTOMY PARTIAL / LUMPECTOMY W/ AXILLARY LYMPHADENECTOMY Right    lumpectomy and lymph nodes removed   PATCH ANGIOPLASTY Right 06/09/2019   Procedure: Patch Angioplasty of right carotid artery using hemashield paltinum finesse patch;  Surgeon: Sherren Kerns, MD;  Location: [facility] OR;  Service: Vascular;  Laterality: Right;   RIGHT/LEFT HEART CATH AND CORONARY ANGIOGRAPHY  N/A 01/27/2023   Procedure: RIGHT/LEFT HEART CATH AND CORONARY ANGIOGRAPHY;  Surgeon: Elder Negus, MD;  Location: [facility];  Service: Cardiovascular;  Laterality: N/A;   TUBAL LIGATION     VULVA SURGERY     Multiple times for dysplasia    Social History   Tobacco Use  Smoking Status Former   Current packs/day: 0.00   Average packs/day: 0.8 packs/day for 20.0 years (15.0 ttl pk-yrs)   Types: Cigarettes   Start date: 05/06/1977   Quit date: 05/06/1997   Years since quitting: 25.7  Smokeless Tobacco Never    Social History   Substance and Sexual Activity  Alcohol Use Yes   Alcohol/week: 0.0 standard drinks of alcohol   Comment: occ    Social History   Socioeconomic History   Marital status: Widowed    Spouse name: Not on file   Number of  children: 2   Years of education: Not on file   Highest education level: Not on file  Occupational History   Occupation: server    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  Tobacco Use   Smoking status: Former    Current packs/day: 0.00    Average packs/day: 0.8 packs/day for 20.0 years (15.0 ttl pk-yrs)    Types: Cigarettes    Start date: 05/06/1977    Quit date: 05/06/1997    Years since quitting: 25.7   Smokeless tobacco: Never  Vaping Use   Vaping status: Never Used  Substance and Sexual Activity   Alcohol use: Yes    Alcohol/week: 0.0 standard drinks of alcohol    Comment: occ   Drug use: No   Sexual activity: Not Currently  Other Topics Concern   Not on file  Social History Narrative   Not on file   Social Determinants of Health   Financial Resource Strain: Not on file  Food Insecurity: No Food Insecurity (01/24/2023)   Hunger Vital Sign    Worried About Running Out of Food in the Last Year: Never true    Ran Out of Food in the Last Year: Never true  Transportation Needs: No Transportation Needs (01/24/2023)   PRAPARE - Administrator, Civil Service (Medical): No    Lack of Transportation (Non-Medical): No  Physical Activity: Not on file  Stress: Not on file  Social Connections: Not on file  Intimate Partner Violence: Not At Risk (01/24/2023)   Humiliation, Afraid, Rape, and Kick questionnaire    Fear of Current or Ex-Partner: No    Emotionally Abused: No    Physically Abused: No    Sexually Abused: No    Allergies  Allergen Reactions   Prednisone Palpitations   Fentanyl Swelling and Other (See Comments)    Makes pt "hyper" feels she is "climbing the walls."  Last time she had a colonoscopy with Fentanyl and Versed she was awake the whole time.   Versed [Midazolam] Swelling and Anxiety    Makes pt "hyper" feels she is "climbing the walls."  Last time she had a colonoscopy with Fentanyl and Versed she was awake the whole time.   Vicodin [Hydrocodone-Acetaminophen]  Anxiety    Extreme anxiety.  OK to take oxycodone.    Current Facility-Administered Medications  Medication Dose Route Frequency Provider Last Rate Last Admin   0.9 %  sodium chloride infusion  250 mL Intravenous PRN Norins, Rosalyn Gess, MD       0.9 %  sodium chloride infusion  250 mL Intravenous PRN Patwardhan, Anabel Bene, MD  acetaminophen (TYLENOL) tablet 1,000 mg  1,000 mg Oral Q6H PRN Norins, Rosalyn Gess, MD   1,000 mg at 01/28/23 1610   acetaminophen (TYLENOL) tablet 650 mg  650 mg Oral Q4H PRN Patwardhan, Anabel Bene, MD       ALPRAZolam Prudy Feeler) tablet 1 mg  1 mg Oral TID John Giovanni, MD   1 mg at 01/28/23 0911   alum & mag hydroxide-simeth (MAALOX/MYLANTA) 200-200-20 MG/5ML suspension 30 mL  30 mL Oral Q6H PRN Pokhrel, Laxman, MD   30 mL at 01/28/23 9604   aspirin chewable tablet 81 mg  81 mg Oral Daily Norins, Rosalyn Gess, MD   81 mg at 01/28/23 0911   busPIRone (BUSPAR) tablet 5 mg  5 mg Oral BID Norins, Rosalyn Gess, MD   5 mg at 01/28/23 0912   butalbital-acetaminophen-caffeine (FIORICET) 50-325-40 MG per tablet 1 tablet  1 tablet Oral Once Pokhrel, Laxman, MD       calcium carbonate (TUMS - dosed in mg elemental calcium) chewable tablet 200 mg of elemental calcium  1 tablet Oral TID PRN Tolia, Sunit, DO       enoxaparin (LOVENOX) injection 40 mg  40 mg Subcutaneous Q24H Norins, Rosalyn Gess, MD   40 mg at 01/28/23 5409   furosemide (LASIX) injection 40 mg  40 mg Intravenous Daily Pokhrel, Laxman, MD   40 mg at 01/28/23 0911   losartan (COZAAR) tablet 25 mg  25 mg Oral Daily Norins, Rosalyn Gess, MD   25 mg at 01/28/23 0911   nitroGLYCERIN (NITROSTAT) SL tablet 0.4 mg  0.4 mg Sublingual Q5 min PRN Norins, Rosalyn Gess, MD       omega-3 acid ethyl esters (LOVAZA) capsule 1 g  1 g Oral Daily Norins, Rosalyn Gess, MD   1 g at 01/28/23 0911   ondansetron (ZOFRAN) injection 4 mg  4 mg Intravenous Q6H PRN Patwardhan, Anabel Bene, MD       Oral care mouth rinse  15 mL Mouth Rinse PRN Norins, Rosalyn Gess,  MD       pantoprazole (PROTONIX) EC tablet 40 mg  40 mg Oral Daily Norins, Rosalyn Gess, MD   40 mg at 01/28/23 0911   rosuvastatin (CRESTOR) tablet 20 mg  20 mg Oral Daily Norins, Rosalyn Gess, MD       sodium chloride flush (NS) 0.9 % injection 3 mL  3 mL Intravenous Q12H Norins, Rosalyn Gess, MD   3 mL at 01/28/23 0913   sodium chloride flush (NS) 0.9 % injection 3 mL  3 mL Intravenous PRN Norins, Rosalyn Gess, MD       sodium chloride flush (NS) 0.9 % injection 3 mL  3 mL Intravenous Q12H Patwardhan, Manish J, MD       sodium chloride flush (NS) 0.9 % injection 3 mL  3 mL Intravenous PRN Patwardhan, Manish J, MD       spironolactone (ALDACTONE) tablet 25 mg  25 mg Oral Daily Tolia, Sunit, DO   25 mg at 01/28/23 0910     Family History  Problem Relation Age of Onset   Colon cancer Mother 47   Heart disease Father        heart attack   Colon polyps Brother        one brother had large colon polyp that needed surgery to be removed   Heart disease Paternal Grandfather    Diabetes type II Brother        Physical Exam: BP (!) 142/98 (BP Location: Left  Arm)   Pulse 88   Temp 98 F (36.7 C) (Oral)   Resp 14   Ht 5\' 8"  (1.727 m)   Wt 93.8 kg   SpO2 97%   BMI 31.43 kg/m  Obese Lungs: decreased at bases Card: RR with 2/6sem Ext: no edema Neuro: intact    Diagnostic Studies & Laboratory data: I have personally reviewed the following studies and agree with the findings     Recent Radiology Findings:   CARDIAC CATHETERIZATION  Result Date: 01/27/2023 Images from the original result were not included. LM: Normal LAD: 95% mid LAD stenosis with mild calcification and moderate tortuosity (New since 2021)          40% downstream lesion in mid LAD (New since 2021)          Otherwise mild diffuse disease Lcx: Mid diffuse 50% disease (Previously deemed 80% in 2021) Ramus: Patent stent. No restenosis RCA: Mid eccentric 40% disease. Rest mild diffuse disease RA: 4 mmHg RV: 31/2 mmHg PA: 23/10 mmHg,  mPAP 14 mmHg PCW: 4 mmHg LV: 132/0 mmHg Ao: 101/59 mmHg CO: 5.5 L/min CI: 2.7 L/min/m2 AoV mean PG 29 mmHg AVA 1.0 cm2, AVAi 0.5 cm2/.m2 Exertional angina and dyspnea Single vessel obstructive disease Compensated ischemic or valvular cardiomyopathy (EF 30-35% with akinetic apex and septum on echocardiogram) Probable low flow low gradient severe AS GDMT for HFrEF likely limited by low blood pressure in the setting of severe AS Will discuss with multidisciplinary heart team re: GDMT for HFrEF + PCI and TAVR vs CABG (LIMA-LAD) + AVR Elder Negus, MD Pager: [phone] Office: [phone]      Recent Lab Findings: Lab Results  Component Value Date   WBC 6.3 01/27/2023   HGB 14.0 01/27/2023   HCT 42.5 01/27/2023   PLT 413 (H) 01/27/2023   GLUCOSE 113 (H) 01/28/2023   CHOL 220 (H) 12/30/2022   TRIG 126 12/30/2022   HDL 51 12/30/2022   LDLCALC 146 (H) 12/30/2022   ALT 27 01/23/2023   AST 26 01/23/2023   NA 131 (L) 01/28/2023   K 4.2 01/28/2023   CL 98 01/28/2023   CREATININE 0.67 01/28/2023   BUN 16 01/28/2023   CO2 25 01/28/2023   TSH 3.556 01/18/2018   INR 0.9 06/06/2019   HGBA1C 5.8 (H) 10/15/2017      Assessment / Plan:     67 yo female with NYHA class 2-3 symptoms of ischemic cardiomyopathy complicated with low flow low gradient AS. Pt with depressed LV function with EF 30-35% and with comorbidities that would make SAVR/CABG higher risk. I feel that TAVR work up to determine if TAVR anatomically feasable would be her best management for her AS and to PCI LAD lesion. I have discussed this with her and her daughter on the phone and they will wait her TAVR CTA to make decision on management.    I have spent 60 min in review of the records, viewing studies and in face to face with patient and in coordination of future care    Eugenio Hoes 01/28/2023 11:21 AM

## 2023-01-28 NOTE — TOC Initial Note (Signed)
Transition of Care [facility]) - Initial/Assessment Note    Patient Details  Name: Madison West MRN: [id number] Date of Birth: [birth year]  [facility] CM/SW Contact:    Gala Lewandowsky, RN Phone Number: 01/28/2023, 11:32 AM  Clinical Narrative: Patient was discussed in progression rounds. Plan for coronary stent 01-29-23. Case Manager continues to follow for transition of care needs as the patient progresses.                   Expected Discharge Plan: Home/Self Care Barriers to Discharge: Continued Medical Work up   Patient Goals and CMS Choice Patient states their goals for this hospitalization and ongoing recovery are:: to return home.   Choice offered to / list presented to : NA    Expected Discharge Plan and Services In-house Referral: NA Discharge Planning Services: CM Consult Post Acute Care Choice: NA Living arrangements for the past 2 months: Single Family Home  Prior Living Arrangements/Services Living arrangements for the past 2 months: Single Family Home Lives with:: Self Patient language and need for interpreter reviewed:: Yes Do you feel safe going back to the place where you live?: Yes      Need for Family Participation in Patient Care: Yes (Comment) Care giver support system in place?: Yes (comment)   Criminal Activity/Legal Involvement Pertinent to Current Situation/Hospitalization: No - Comment as needed  Activities of Daily Living Home Assistive Devices/Equipment: None ADL Screening (condition at time of admission) Patient's cognitive ability adequate to safely complete daily activities?: Yes Is the patient deaf or have difficulty hearing?: No Does the patient have difficulty seeing, even when wearing glasses/contacts?: No Does the patient have difficulty concentrating, remembering, or making decisions?: No Patient able to express need for assistance with ADLs?: Yes Does the patient have difficulty dressing or bathing?:  No Independently performs ADLs?: Yes (appropriate for developmental age) Does the patient have difficulty walking or climbing stairs?: No Weakness of Legs: None Weakness of Arms/Hands: None  Permission Sought/Granted Permission sought to share information with : Family Supports, Case Manager Permission granted to share information with : Yes, Verbal Permission Granted  Emotional Assessment Appearance:: Appears stated age Attitude/Demeanor/Rapport: Engaged Affect (typically observed): Appropriate Orientation: : Oriented to Self, Oriented to Place, Oriented to  Time, Oriented to Situation Alcohol / Substance Use: Not Applicable Psych Involvement: No (comment)  Admission diagnosis:  SOB (shortness of breath) [R06.02] Acute CHF (congestive heart failure) (HCC) [I50.9] CHF (congestive heart failure) (HCC) [I50.9] Patient Active Problem List   Diagnosis Date Noted   Atherosclerotic heart disease 01/27/2023   Acute HFrEF (heart failure with reduced ejection fraction) (HCC) 01/27/2023   Nonrheumatic aortic valve stenosis 01/27/2023   Former smoker 01/27/2023   Hypertensive heart disease with acute systolic congestive heart failure (HCC) 01/27/2023   Bilateral carotid artery stenosis 01/27/2023   HFrEF (heart failure with reduced ejection fraction) (HCC) 01/27/2023   Acute CHF (congestive heart failure) (HCC) 01/24/2023   CHF (congestive heart failure) (HCC) 01/24/2023   Infected cat bite 02/14/2021   Hyponatremia 02/14/2021   Hypokalemia 02/14/2021   Cellulitis 02/14/2021   Coronary artery disease of native artery of native heart with stable angina pectoris (HCC)    Pure hypercholesterolemia    Angina pectoris (HCC) 02/26/2020   Asymptomatic stenosis of right carotid artery without infarction 06/09/2019   CRPS (complex regional pain syndrome), upper limb 10/19/2012   History of DVT (deep vein thrombosis) 10/05/2011   Hypertension 10/04/2011   Anxiety 10/04/2011   Depression  10/04/2011   Personal history of vulvar dysplasia 08/15/2011   Grade 3 vulvar intraepithelial neoplasia 06/30/2011   PCP:  [name], MD Pharmacy:   [facility] - [place], [place] - [address] [place] [address] Phone: [phone] Fax: [phone]  [id number] - [place], [place] - [address] [facility] [address] [place] [address] Phone: [phone] Fax: [phone]  [facility] [address] [place] [address] Phone: [phone] Fax: [phone]  [facility] [id number] [place], [place] - [address] [place] [address] Phone: [phone] Fax: [phone]  [id number] - [place], [place] - [address] [place] [address] Phone: [phone] Fax: [phone]  Social Determinants of Health (SDOH) Social History: SDOH Screenings   Food Insecurity: No Food Insecurity (01/24/2023)  Housing: Low Risk  (01/24/2023)  Transportation Needs: No Transportation Needs (01/24/2023)  Utilities: Not At Risk (01/24/2023)  Depression (PHQ2-9): Low Risk  (01/19/2020)  Tobacco Use: Medium Risk (01/24/2023)   Readmission Risk Interventions     No data to display

## 2023-01-28 NOTE — H&P (View-Only) (Signed)
 [address]       [place] [address]             [phone]           [facility] Medical Record #[id number] Date of Birth: 11-17-1955  No ref. provider found [name], MD  Chief Complaint:    Chief Complaint  Patient presents with   Shortness of Breath    History of Present Illness:     Pt is a 67 yo female with known CAD sp stenting in past who over the past 6 months has been having issues with chest burning and SOB. Pt has had work up in December with echo with normal LV function and mild AS. Pt also underwent stress test without indication of ischemia and was treated with several courses of antibiotics for pneumonias. Pt however continued to have increasing SOB with inability to talk without becoming SOB and came to ER. By echo now with depressed LV function and mean gradient of aortic valve of and evidence for low flow low gradient AS with calcified valve visually. Pt also now with tight mid LAD lesion by cath with open stent in OM. Pt with PAD sp CEA and also with hx of breast cancer with chemo and RT to chest. She has significant anxiety and has not been consistent with medications secondary to concern over their effectiveness      Past Medical History:  Diagnosis Date   Anxiety    Arthritis    low back and hip pain intermittent   Breast cancer (HCC) 06/02/07   r breast -surgery ,radiaology. chemotherapy   Carpal tunnel syndrome    right hand   Colon polyps    Complication of anesthesia    Fentanyl, Versed-makes extra hyper, bradycardia x 1 in PACU, [facility] (08/15/11 cardiology felt neostigmine may have resulted in AV nodal block)    Coronary artery disease    Depression    denies   Dysplasia of vulva    Hypertension    Palpitations    PSVT, s/p adenosine 08/04/16   S/P breast lumpectomy 07/04/07   R breast   S/P radiation therapy 2009    Past Surgical History:  Procedure Laterality Date   APPENDECTOMY      age 1   CESAREAN SECTION     x 2   COLONOSCOPY W/ POLYPECTOMY     COLONOSCOPY WITH PROPOFOL N/A 04/27/2016   Procedure: COLONOSCOPY WITH PROPOFOL;  Surgeon: Charolett Bumpers, MD;  Location: [facility];  Service: Endoscopy;  Laterality: N/A;   DILATION AND CURETTAGE OF UTERUS     multiple   ENDARTERECTOMY Right 06/09/2019   ENDARTERECTOMY Right 06/09/2019   Procedure: ENDARTERECTOMY CAROTID RIGHT;  Surgeon: Sherren Kerns, MD;  Location: [facility] OR;  Service: Vascular;  Laterality: Right;   INTRAUTERINE DEVICE INSERTION     IUD REMOVAL     LEFT HEART CATH AND CORONARY ANGIOGRAPHY N/A 02/27/2020   Procedure: LEFT HEART CATH AND CORONARY ANGIOGRAPHY;  Surgeon: Yates Decamp, MD;  Location: [facility] INVASIVE CV LAB;  Service: Cardiovascular;  Laterality: N/A;   MASTECTOMY PARTIAL / LUMPECTOMY W/ AXILLARY LYMPHADENECTOMY Right    lumpectomy and lymph nodes removed   PATCH ANGIOPLASTY Right 06/09/2019   Procedure: Patch Angioplasty of right carotid artery using hemashield paltinum finesse patch;  Surgeon: Sherren Kerns, MD;  Location: [facility] OR;  Service: Vascular;  Laterality: Right;   RIGHT/LEFT HEART CATH AND CORONARY ANGIOGRAPHY  N/A 01/27/2023   Procedure: RIGHT/LEFT HEART CATH AND CORONARY ANGIOGRAPHY;  Surgeon: Elder Negus, MD;  Location: [facility]AS[facility] CV LAB;  Service: Cardiovascular;  Laterality: N/A;   TUBAL LIGATION     VULVA SURGERY     Multiple times for dysplasia    Social History   Tobacco Use  Smoking Status Former   Current packs/day: 0.00   Average packs/day: 0.8 packs/day for 20.0 years (15.0 ttl pk-yrs)   Types: Cigarettes   Start date: 05/06/1977   Quit date: 05/06/1997   Years since quitting: 25.7  Smokeless Tobacco Never    Social History   Substance and Sexual Activity  Alcohol Use Yes   Alcohol/week: 0.0 standard drinks of alcohol   Comment: occ    Social History   Socioeconomic History   Marital status: Widowed    Spouse name: Not on file   Number of  children: 2   Years of education: Not on file   Highest education level: Not on file  Occupational History   Occupation: server    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  Tobacco Use   Smoking status: Former    Current packs/day: 0.00    Average packs/day: 0.8 packs/day for 20.0 years (15.0 ttl pk-yrs)    Types: Cigarettes    Start date: 05/06/1977    Quit date: 05/06/1997    Years since quitting: 25.7   Smokeless tobacco: Never  Vaping Use   Vaping status: Never Used  Substance and Sexual Activity   Alcohol use: Yes    Alcohol/week: 0.0 standard drinks of alcohol    Comment: occ   Drug use: No   Sexual activity: Not Currently  Other Topics Concern   Not on file  Social History Narrative   Not on file   Social Determinants of Health   Financial Resource Strain: Not on file  Food Insecurity: No Food Insecurity (01/24/2023)   Hunger Vital Sign    Worried About Running Out of Food in the Last Year: Never true    Ran Out of Food in the Last Year: Never true  Transportation Needs: No Transportation Needs (01/24/2023)   PRAPARE - Administrator, Civil Service (Medical): No    Lack of Transportation (Non-Medical): No  Physical Activity: Not on file  Stress: Not on file  Social Connections: Not on file  Intimate Partner Violence: Not At Risk (01/24/2023)   Humiliation, Afraid, Rape, and Kick questionnaire    Fear of Current or Ex-Partner: No    Emotionally Abused: No    Physically Abused: No    Sexually Abused: No    Allergies  Allergen Reactions   Prednisone Palpitations   Fentanyl Swelling and Other (See Comments)    Makes pt "hyper" feels she is "climbing the walls."  Last time she had a colonoscopy with Fentanyl and Versed she was awake the whole time.   Versed [Midazolam] Swelling and Anxiety    Makes pt "hyper" feels she is "climbing the walls."  Last time she had a colonoscopy with Fentanyl and Versed she was awake the whole time.   Vicodin [Hydrocodone-Acetaminophen]  Anxiety    Extreme anxiety.  OK to take oxycodone.    Current Facility-Administered Medications  Medication Dose Route Frequency Provider Last Rate Last Admin   0.9 %  sodium chloride infusion  250 mL Intravenous PRN Norins, Rosalyn Gess, MD       0.9 %  sodium chloride infusion  250 mL Intravenous PRN Patwardhan, Anabel Bene, MD  acetaminophen (TYLENOL) tablet 1,000 mg  1,000 mg Oral Q6H PRN Norins, Rosalyn Gess, MD   1,000 mg at 01/28/23 1610   acetaminophen (TYLENOL) tablet 650 mg  650 mg Oral Q4H PRN Patwardhan, Anabel Bene, MD       ALPRAZolam Prudy Feeler) tablet 1 mg  1 mg Oral TID John Giovanni, MD   1 mg at 01/28/23 0911   alum & mag hydroxide-simeth (MAALOX/MYLANTA) 200-200-20 MG/5ML suspension 30 mL  30 mL Oral Q6H PRN Pokhrel, Laxman, MD   30 mL at 01/28/23 9604   aspirin chewable tablet 81 mg  81 mg Oral Daily Norins, Rosalyn Gess, MD   81 mg at 01/28/23 0911   busPIRone (BUSPAR) tablet 5 mg  5 mg Oral BID Norins, Rosalyn Gess, MD   5 mg at 01/28/23 0912   butalbital-acetaminophen-caffeine (FIORICET) 50-325-40 MG per tablet 1 tablet  1 tablet Oral Once Pokhrel, Laxman, MD       calcium carbonate (TUMS - dosed in mg elemental calcium) chewable tablet 200 mg of elemental calcium  1 tablet Oral TID PRN Tolia, Sunit, DO       enoxaparin (LOVENOX) injection 40 mg  40 mg Subcutaneous Q24H Norins, Rosalyn Gess, MD   40 mg at 01/28/23 5409   furosemide (LASIX) injection 40 mg  40 mg Intravenous Daily Pokhrel, Laxman, MD   40 mg at 01/28/23 0911   losartan (COZAAR) tablet 25 mg  25 mg Oral Daily Norins, Rosalyn Gess, MD   25 mg at 01/28/23 0911   nitroGLYCERIN (NITROSTAT) SL tablet 0.4 mg  0.4 mg Sublingual Q5 min PRN Norins, Rosalyn Gess, MD       omega-3 acid ethyl esters (LOVAZA) capsule 1 g  1 g Oral Daily Norins, Rosalyn Gess, MD   1 g at 01/28/23 0911   ondansetron (ZOFRAN) injection 4 mg  4 mg Intravenous Q6H PRN Patwardhan, Anabel Bene, MD       Oral care mouth rinse  15 mL Mouth Rinse PRN Norins, Rosalyn Gess,  MD       pantoprazole (PROTONIX) EC tablet 40 mg  40 mg Oral Daily Norins, Rosalyn Gess, MD   40 mg at 01/28/23 0911   rosuvastatin (CRESTOR) tablet 20 mg  20 mg Oral Daily Norins, Rosalyn Gess, MD       sodium chloride flush (NS) 0.9 % injection 3 mL  3 mL Intravenous Q12H Norins, Rosalyn Gess, MD   3 mL at 01/28/23 0913   sodium chloride flush (NS) 0.9 % injection 3 mL  3 mL Intravenous PRN Norins, Rosalyn Gess, MD       sodium chloride flush (NS) 0.9 % injection 3 mL  3 mL Intravenous Q12H Patwardhan, Manish J, MD       sodium chloride flush (NS) 0.9 % injection 3 mL  3 mL Intravenous PRN Patwardhan, Manish J, MD       spironolactone (ALDACTONE) tablet 25 mg  25 mg Oral Daily Tolia, Sunit, DO   25 mg at 01/28/23 0910     Family History  Problem Relation Age of Onset   Colon cancer Mother 47   Heart disease Father        heart attack   Colon polyps Brother        one brother had large colon polyp that needed surgery to be removed   Heart disease Paternal Grandfather    Diabetes type II Brother        Physical Exam: BP (!) 142/98 (BP Location: Left  Arm)   Pulse 88   Temp 98 F (36.7 C) (Oral)   Resp 14   Ht 5\' 8"  (1.727 m)   Wt 93.8 kg   SpO2 97%   BMI 31.43 kg/m  Obese Lungs: decreased at bases Card: RR with 2/6sem Ext: no edema Neuro: intact    Diagnostic Studies & Laboratory data: I have personally reviewed the following studies and agree with the findings     Recent Radiology Findings:   CARDIAC CATHETERIZATION  Result Date: 01/27/2023 Images from the original result were not included. LM: Normal LAD: 95% mid LAD stenosis with mild calcification and moderate tortuosity (New since 2021)          40% downstream lesion in mid LAD (New since 2021)          Otherwise mild diffuse disease Lcx: Mid diffuse 50% disease (Previously deemed 80% in 2021) Ramus: Patent stent. No restenosis RCA: Mid eccentric 40% disease. Rest mild diffuse disease RA: 4 mmHg RV: 31/2 mmHg PA: 23/10 mmHg,  mPAP 14 mmHg PCW: 4 mmHg LV: 132/0 mmHg Ao: 101/59 mmHg CO: 5.5 L/min CI: 2.7 L/min/m2 AoV mean PG 29 mmHg AVA 1.0 cm2, AVAi 0.5 cm2/.m2 Exertional angina and dyspnea Single vessel obstructive disease Compensated ischemic or valvular cardiomyopathy (EF 30-35% with akinetic apex and septum on echocardiogram) Probable low flow low gradient severe AS GDMT for HFrEF likely limited by low blood pressure in the setting of severe AS Will discuss with multidisciplinary heart team re: GDMT for HFrEF + PCI and TAVR vs CABG (LIMA-LAD) + AVR Elder Negus, MD Pager: [phone] Office: [phone]      Recent Lab Findings: Lab Results  Component Value Date   WBC 6.3 01/27/2023   HGB 14.0 01/27/2023   HCT 42.5 01/27/2023   PLT 413 (H) 01/27/2023   GLUCOSE 113 (H) 01/28/2023   CHOL 220 (H) 12/30/2022   TRIG 126 12/30/2022   HDL 51 12/30/2022   LDLCALC 146 (H) 12/30/2022   ALT 27 01/23/2023   AST 26 01/23/2023   NA 131 (L) 01/28/2023   K 4.2 01/28/2023   CL 98 01/28/2023   CREATININE 0.67 01/28/2023   BUN 16 01/28/2023   CO2 25 01/28/2023   TSH 3.556 01/18/2018   INR 0.9 06/06/2019   HGBA1C 5.8 (H) 10/15/2017      Assessment / Plan:     67 yo female with NYHA class 2-3 symptoms of ischemic cardiomyopathy complicated with low flow low gradient AS. Pt with depressed LV function with EF 30-35% and with comorbidities that would make SAVR/CABG higher risk. I feel that TAVR work up to determine if TAVR anatomically feasable would be her best management for her AS and to PCI LAD lesion. I have discussed this with her and her daughter on the phone and they will wait her TAVR CTA to make decision on management.    I have spent 60 min in review of the records, viewing studies and in face to face with patient and in coordination of future care    Eugenio Hoes 01/28/2023 11:21 AM

## 2023-01-29 ENCOUNTER — Inpatient Hospital Stay (HOSPITAL_COMMUNITY): Payer: Medicare Other

## 2023-01-29 ENCOUNTER — Encounter (HOSPITAL_COMMUNITY)
Admission: EM | Disposition: A | Discharge status: Rehabilitation Facility | Payer: Self-pay | Source: Home / Self Care | Attending: Pulmonary Disease

## 2023-01-29 ENCOUNTER — Other Ambulatory Visit (HOSPITAL_COMMUNITY): Payer: Self-pay

## 2023-01-29 DIAGNOSIS — F411 Generalized anxiety disorder: Secondary | ICD-10-CM | POA: Diagnosis not present

## 2023-01-29 DIAGNOSIS — E7800 Pure hypercholesterolemia, unspecified: Secondary | ICD-10-CM | POA: Diagnosis not present

## 2023-01-29 DIAGNOSIS — I5031 Acute diastolic (congestive) heart failure: Secondary | ICD-10-CM | POA: Diagnosis not present

## 2023-01-29 DIAGNOSIS — I10 Essential (primary) hypertension: Secondary | ICD-10-CM | POA: Diagnosis not present

## 2023-01-29 HISTORY — PX: CORONARY STENT INTERVENTION: CATH118234

## 2023-01-29 LAB — CBC
HCT: 42 % (ref 36.0–46.0)
Hemoglobin: 14.2 g/dL (ref 12.0–15.0)
MCH: 31.7 pg (ref 26.0–34.0)
MCHC: 33.8 g/dL (ref 30.0–36.0)
MCV: 93.8 fL (ref 80.0–100.0)
Platelets: 377 10*3/uL (ref 150–400)
RBC: 4.48 MIL/uL (ref 3.87–5.11)
RDW: 13.2 % (ref 11.5–15.5)
WBC: 6.8 10*3/uL (ref 4.0–10.5)
nRBC: 0 % (ref 0.0–0.2)

## 2023-01-29 LAB — POCT ACTIVATED CLOTTING TIME
Activated Clotting Time: 238 seconds
Activated Clotting Time: 403 seconds

## 2023-01-29 LAB — CREATININE, SERUM
Creatinine, Ser: 0.76 mg/dL (ref 0.44–1.00)
GFR, Estimated: >60 mL/min (ref >60)

## 2023-01-29 SURGERY — CORONARY STENT INTERVENTION
Anesthesia: LOCAL

## 2023-01-29 MED ORDER — VERAPAMIL HCL 2.5 MG/ML IV SOLN
INTRAVENOUS | Status: AC
  Filled 2023-01-29: qty 12

## 2023-01-29 MED ORDER — SODIUM CHLORIDE 0.9% FLUSH
3.0000 mL | Freq: Two times a day (BID) | INTRAVENOUS | Status: DC
  Administered 2023-01-30 – 2023-02-02 (×5): 3 mL via INTRAVENOUS

## 2023-01-29 MED ORDER — SODIUM CHLORIDE 0.9 % IV SOLN: 250.0000 mL | INTRAVENOUS | Status: DC | PRN

## 2023-01-29 MED ORDER — FENTANYL CITRATE (PF) 100 MCG/2ML IJ SOLN
INTRAMUSCULAR | Status: DC | PRN
  Administered 2023-01-29 (×2): 25 ug via INTRAVENOUS

## 2023-01-29 MED ORDER — DIAZEPAM 5 MG PO TABS
5.0000 mg | ORAL_TABLET | Freq: Once | ORAL | Status: AC
  Administered 2023-01-29: 5 mg via ORAL
  Filled 2023-01-29: qty 1

## 2023-01-29 MED ORDER — LABETALOL HCL 5 MG/ML IV SOLN: 10.0000 mg | INTRAVENOUS | Status: AC | PRN

## 2023-01-29 MED ORDER — HYDRALAZINE HCL 20 MG/ML IJ SOLN: 10.0000 mg | INTRAMUSCULAR | Status: AC | PRN

## 2023-01-29 MED ORDER — HEPARIN (PORCINE) IN NACL 1000-0.9 UT/500ML-% IV SOLN
INTRAVENOUS | Status: DC | PRN
  Administered 2023-01-29 (×2): 500 mL

## 2023-01-29 MED ORDER — SODIUM CHLORIDE 0.9 % IV SOLN: INTRAVENOUS | Status: AC

## 2023-01-29 MED ORDER — IOHEXOL 350 MG/ML SOLN
INTRAVENOUS | Status: DC | PRN
  Administered 2023-01-29: 60 mL

## 2023-01-29 MED ORDER — MIDAZOLAM HCL 2 MG/2ML IJ SOLN
INTRAMUSCULAR | Status: AC
  Filled 2023-01-29: qty 2

## 2023-01-29 MED ORDER — LIDOCAINE HCL (CARDIAC) PF 100 MG/5ML IV SOSY
PREFILLED_SYRINGE | INTRAVENOUS | Status: AC
  Filled 2023-01-29: qty 5

## 2023-01-29 MED ORDER — MIDAZOLAM HCL 2 MG/2ML IJ SOLN
INTRAMUSCULAR | Status: DC | PRN
  Administered 2023-01-29 (×2): 1 mg via INTRAVENOUS

## 2023-01-29 MED ORDER — HEPARIN SODIUM (PORCINE) 1000 UNIT/ML IJ SOLN
INTRAMUSCULAR | Status: AC
  Filled 2023-01-29: qty 10

## 2023-01-29 MED ORDER — SODIUM CHLORIDE 0.9% FLUSH: 3.0000 mL | INTRAVENOUS | Status: DC | PRN

## 2023-01-29 MED ORDER — LIDOCAINE HCL (PF) 1 % IJ SOLN
INTRAMUSCULAR | Status: DC | PRN
  Administered 2023-01-29: 2 mL

## 2023-01-29 MED ORDER — HEPARIN SODIUM (PORCINE) 1000 UNIT/ML IJ SOLN
INTRAMUSCULAR | Status: DC | PRN
  Administered 2023-01-29: 9000 [IU] via INTRAVENOUS

## 2023-01-29 MED ORDER — NITROGLYCERIN 1 MG/10 ML FOR IR/CATH LAB
INTRA_ARTERIAL | Status: AC
  Filled 2023-01-29: qty 10

## 2023-01-29 MED ORDER — SODIUM CHLORIDE 0.9 % IV SOLN: INTRAVENOUS | Status: DC

## 2023-01-29 MED ORDER — CLOPIDOGREL BISULFATE 75 MG PO TABS
600.0000 mg | ORAL_TABLET | Freq: Once | ORAL | Status: AC
  Administered 2023-01-29: 600 mg via ORAL
  Filled 2023-01-29: qty 8

## 2023-01-29 MED ORDER — VERAPAMIL HCL 2.5 MG/ML IV SOLN
INTRAVENOUS | Status: DC | PRN
  Administered 2023-01-29: 5 mL via INTRA_ARTERIAL

## 2023-01-29 MED ORDER — ACETAMINOPHEN 325 MG PO TABS
650.0000 mg | ORAL_TABLET | ORAL | Status: DC | PRN
  Administered 2023-02-01: 650 mg via ORAL
  Filled 2023-01-29: qty 2

## 2023-01-29 MED ORDER — ONDANSETRON HCL 4 MG/2ML IJ SOLN: 4.0000 mg | Freq: Four times a day (QID) | INTRAMUSCULAR | Status: DC | PRN

## 2023-01-29 MED ORDER — CLOPIDOGREL BISULFATE 75 MG PO TABS
75.0000 mg | ORAL_TABLET | Freq: Every day | ORAL | 1 refills | Status: AC
Start: 2023-01-29 — End: 2024-01-29
  Filled 2023-01-29: qty 30, 30d supply, fill #0

## 2023-01-29 MED ORDER — FENTANYL CITRATE (PF) 100 MCG/2ML IJ SOLN
INTRAMUSCULAR | Status: AC
  Filled 2023-01-29: qty 2

## 2023-01-29 MED ORDER — CLOPIDOGREL BISULFATE 75 MG PO TABS
75.0000 mg | ORAL_TABLET | Freq: Every day | ORAL | Status: DC
  Administered 2023-01-30 – 2023-01-31 (×2): 75 mg via ORAL
  Filled 2023-01-29 (×2): qty 1

## 2023-01-29 SURGICAL SUPPLY — 22 items
BALL SAPPHIRE NC24 3.25X15 (BALLOONS) ×1
BALLN EMERGE MR 3.0X15 (BALLOONS) ×1
BALLOON EMERGE MR 3.0X15 (BALLOONS) IMPLANT
BALLOON SAPPHIRE NC24 3.25X15 (BALLOONS) IMPLANT
CATH LAUNCHER 6FR EBU3.5 (CATHETERS) IMPLANT
CATH VENTURE RX (CATHETERS) IMPLANT
DEVICE RAD COMP TR BAND LRG (VASCULAR PRODUCTS) IMPLANT
ELECT DEFIB PAD ADLT CADENCE (PAD) IMPLANT
GLIDESHEATH SLEND SS 6F .021 (SHEATH) IMPLANT
GUIDEWIRE INQWIRE 1.5J.035X260 (WIRE) IMPLANT
INQWIRE 1.5J .035X260CM (WIRE) ×1
KIT ENCORE 26 ADVANTAGE (KITS) IMPLANT
KIT HEMO VALVE WATCHDOG (MISCELLANEOUS) IMPLANT
PACK CARDIAC CATHETERIZATION (CUSTOM PROCEDURE TRAY) ×1 IMPLANT
PROTECTION STATION PRESSURIZED (MISCELLANEOUS) ×1
SET ATX-X65L (MISCELLANEOUS) IMPLANT
SHEATH PROBE COVER 6X72 (BAG) IMPLANT
STATION PROTECTION PRESSURIZED (MISCELLANEOUS) IMPLANT
STENT ONYX FRONTIER 3.0X22 (Permanent Stent) IMPLANT
TUBING CIL FLEX 10 FLL-RA (TUBING) IMPLANT
WIRE ASAHI PROWATER 180CM (WIRE) IMPLANT
WIRE RUNTHROUGH .014X180CM (WIRE) IMPLANT

## 2023-01-29 NOTE — H&P (View-Only) (Signed)
Subjective:  Has a lot of anxiety Has had intermittent episodes retrosternal burning, but dyspnea has improved  Seen by Dr. Leafy Ro, felt not to be a candidate for CABG+SAVR, primarily due to her anxiety   Current Facility-Administered Medications:    0.9 %  sodium chloride infusion, 250 mL, Intravenous, PRN, Norins, Rosalyn Gess, MD   0.9 %  sodium chloride infusion, 250 mL, Intravenous, PRN, Makenzey Nanni J, MD   [EXPIRED] 0.9% sodium chloride infusion, 3 mL/kg/hr, Intravenous, Continuous **FOLLOWED BY** 0.9% sodium chloride infusion, 1 mL/kg/hr, Intravenous, Continuous, Felica Chargois J, MD   acetaminophen (TYLENOL) tablet 1,000 mg, 1,000 mg, Oral, Q6H PRN, Norins, Rosalyn Gess, MD, 1,000 mg at 01/28/23 9485   acetaminophen (TYLENOL) tablet 650 mg, 650 mg, Oral, Q4H PRN, Shirlene Andaya J, MD   ALPRAZolam Prudy Feeler) tablet 1 mg, 1 mg, Oral, TID, John Giovanni, MD, 1 mg at 01/29/23 0847   alum & mag hydroxide-simeth (MAALOX/MYLANTA) 200-200-20 MG/5ML suspension 30 mL, 30 mL, Oral, Q6H PRN, Pokhrel, Laxman, MD, 30 mL at 01/29/23 4627   aspirin chewable tablet 81 mg, 81 mg, Oral, Daily, Norins, Rosalyn Gess, MD, 81 mg at 01/28/23 0911   busPIRone (BUSPAR) tablet 5 mg, 5 mg, Oral, BID, Norins, Rosalyn Gess, MD, 5 mg at 01/29/23 0847   butalbital-acetaminophen-caffeine (FIORICET) 50-325-40 MG per tablet 1 tablet, 1 tablet, Oral, Once, Pokhrel, Laxman, MD   calcium carbonate (TUMS - dosed in mg elemental calcium) chewable tablet 200 mg of elemental calcium, 1 tablet, Oral, TID PRN, Tolia, Sunit, DO   enoxaparin (LOVENOX) injection 40 mg, 40 mg, Subcutaneous, Q24H, Norins, Rosalyn Gess, MD, 40 mg at 01/28/23 0350   furosemide (LASIX) injection 40 mg, 40 mg, Intravenous, Daily, Pokhrel, Laxman, MD, 40 mg at 01/29/23 0846   losartan (COZAAR) tablet 25 mg, 25 mg, Oral, Daily, Norins, Rosalyn Gess, MD, 25 mg at 01/29/23 0847   nitroGLYCERIN (NITROSTAT) SL tablet 0.4 mg, 0.4 mg, Sublingual, Q5 min PRN,  Norins, Rosalyn Gess, MD   omega-3 acid ethyl esters (LOVAZA) capsule 1 g, 1 g, Oral, Daily, Norins, Rosalyn Gess, MD, 1 g at 01/29/23 0847   ondansetron (ZOFRAN) injection 4 mg, 4 mg, Intravenous, Q6H PRN, Rosan Calbert J, MD   Oral care mouth rinse, 15 mL, Mouth Rinse, PRN, Norins, Rosalyn Gess, MD   pantoprazole (PROTONIX) EC tablet 40 mg, 40 mg, Oral, Daily, Norins, Rosalyn Gess, MD, 40 mg at 01/29/23 0847   potassium chloride SA (KLOR-CON M) CR tablet 40 mEq, 40 mEq, Oral, BID, Pokhrel, Laxman, MD, 40 mEq at 01/29/23 0847   rosuvastatin (CRESTOR) tablet 20 mg, 20 mg, Oral, Daily, Norins, Rosalyn Gess, MD   sodium chloride flush (NS) 0.9 % injection 3 mL, 3 mL, Intravenous, Q12H, Norins, Rosalyn Gess, MD, 3 mL at 01/28/23 0913   sodium chloride flush (NS) 0.9 % injection 3 mL, 3 mL, Intravenous, PRN, Norins, Rosalyn Gess, MD   sodium chloride flush (NS) 0.9 % injection 3 mL, 3 mL, Intravenous, Q12H, Juliann Olesky J, MD, 3 mL at 01/29/23 0848   sodium chloride flush (NS) 0.9 % injection 3 mL, 3 mL, Intravenous, PRN, Jahmeek Shirk J, MD   spironolactone (ALDACTONE) tablet 25 mg, 25 mg, Oral, Daily, Tolia, Sunit, DO, 25 mg at 01/29/23 0847   Objective:  Vital Signs in the last 24 hours: Temp:  [97.8 F (36.6 C)-98.7 F (37.1 C)] 97.8 F (36.6 C) (08/09 0506) Pulse Rate:  [79-87] 87 (08/09 0506) Resp:  [15-18] 18 (08/09 0506) BP: (109-154)/(69-91) 154/91 (  08/09 0506) SpO2:  [95 %-96 %] 96 % (08/09 0506) Weight:  [93.6 kg] 93.6 kg (08/09 0503)  Intake/Output from previous day: 08/08 0701 - 08/09 0700 In: 360 [P.O.:360] Out: 1600 [Urine:1600]  Physical Exam Vitals and nursing note reviewed.  Constitutional:      General: She is not in acute distress. Neck:     Vascular: No JVD.  Cardiovascular:     Rate and Rhythm: Normal rate and regular rhythm.     Heart sounds: Murmur heard.     Harsh midsystolic murmur is present with a grade of 3/6 at the upper right sternal border radiating to  the neck.  Pulmonary:     Effort: Pulmonary effort is normal.     Breath sounds: Normal breath sounds. No wheezing or rales.      Imaging/tests reviewed and independently interpreted: CTA chest 01/28/2023: 1. Vascular findings and measurements pertinent to potential TAVR procedure, as detailed above. 2. Thickening and calcification of the aortic valve, compatible with reported clinical history of aortic stenosis. 3. Moderate to severe aortoiliac atherosclerosis. Coronary artery calcifications of the LAD and RCA.    Cardiac Studies:  Telemetry 01/29/2023: No significant arrhythmia  LHC/RHC 01/27/2023: LM: Normal LAD: 95% mid LAD stenosis with mild calcification and moderate tortuosity (New since 2021)          40% downstream lesion in mid LAD (New since 2021)          Otherwise mild diffuse disease Lcx: Mid diffuse 50% disease (Previously deemed 80% in 2021) Ramus: Patent stent. No restenosis RCA: Mid eccentric 40% disease. Rest mild diffuse disease   RA: 4 mmHg RV: 31/2 mmHg PA: 23/10 mmHg, mPAP 14 mmHg PCW: 4 mmHg   LV: 132/0 mmHg Ao: 101/59 mmHg CO: 5.5 L/min CI: 2.7 L/min/m2   AoV mean PG 29 mmHg AVA 1.0 cm2, AVAi 0.5 cm2/.m2   Exertional angina and dyspnea Single vessel obstructive disease  Compensated ischemic or valvular cardiomyopathy (EF 30-35% with akinetic apex and septum on echocardiogram) Probable low flow low gradient severe AS GDMT for HFrEF likely limited by low blood pressure in the setting of severe AS   Will discuss with multidisciplinary heart team re: GDMT for HFrEF + PCI and TAVR vs CABG (LIMA-LAD) + AVR   EKG 01/23/2023: Sinus tachycardia Ventricular premature complex LAE, consider biatrial enlargement Left anterior fascicular block Abnormal R-wave progression, early transition Borderline repolarization abnormality Confirmed by Virgina Norfolk [phone]) on 8/  Echocardiogram 01/25/2023:  1. Left ventricular ejection fraction, by estimation, is  30 to 35%. The  left ventricle has moderately decreased function. The left ventricle  demonstrates regional wall motion abnormalities (see scoring  diagram/findings for description). There is mild  left ventricular hypertrophy. Indeterminate diastolic filling due to E-A  fusion. The entire septum and apex are hypokinetic.   2. Right ventricular systolic function is low normal. The right  ventricular size is normal.   3. The mitral valve is grossly normal. No evidence of mitral valve  regurgitation.   4. Native valve, moderate to severe calcification, reduced leaflet  excursion, trace aortic regurgitation, mild to moderate aortic stenosis  (peak velocity 2.31m/s, Peak Gradient 33 mmHg, Mean gradient 20 mmHg, AVA  VTI 0.93cm2, DI 0.33, SVi 25cc (severity   of aortic stenosis is likely underestimated due to low flow - low  gradient AS).   5. The inferior vena cava is normal in size with greater than 50%  respiratory variability, suggesting right atrial pressure of 3 mmHg.  6. Rhythm strip during this exam demonstrates normal sinus rhythm.   Comparison(s): Prior study 05/25/2022: LVEF 55-60%, Grade I diastolic  dysfunction, mild to moderate AS, see report for more details.      Assessment & Recommendations:  67 year old Caucasian female with hypertension, hyperlipidemia, CAD, h/o right carotid endarterectomy, h/o breast cancer, with new diagnosis of HFrEF, moderate aortic stenosis  HFrEF: Likely combination of valvular and ischemic cardiomyopathy. She remains very reluctant to add more medications, something that I would recommend for GFMT for HFrEF. At the very least, continue spironolactone and losartan.  There is also some limitation and uptitrating GDMT given her low normal blood pressures, and presence of likely low-flow low gradient severe attic stenosis.  Aortic stenosis: Very likely low-flow low gradient severe restenosis.  Small possibility remains that if and when her EF  were to improve, that her aortic stenosis area could increase, if this is indeed pseudo stenosis.  TAVR team is involved, with potential plan for TAVR in the near future outpatient.  Patient remains very upset that TAVR will not be performed inpatient.  I have explained to her to best of my ability, that this is not an emergent procedure, and most of the tablets are performed outpatient.  Continue treatment of heart failure and CAD remains key at this time, until TAVR can be performed.  CAD: Progressive exertional angina symptoms, with also some episodes of intermittent unstable angina. I discussed this with the surgery/TAVR team, as well as a multidisciplinary heart team meeting.  Consensus was to proceed with LAD PCI, with TAVR in the near future. I discussed risk, benefits, alternate options at length with the patient.  Patient remains adamant that she did not take Plavix after her last PCI and would not like to take it again.  I have reviewed the chart, and I disagree with her understanding, she was clearly on Plavix after her last PCI.  She is reluctant to take Brilinta, but is fine recommend to take Plavix after upcoming LAD PCI.  We will proceed with LAD PCI this afternoon.  I do not think she will need hemodynamic support, and should be a noncomplex PCI to her LAD.  Will load with 600 mg Plavix now.  Patient may eat light breakfast and be NPO from 11 AM.  From my conversations with the patient, it is evident that working is extremely important for financial reasons, as well as for her sanity.  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  I spoke to patient's daughter Cornell Barman over the phone and explained our recommendations.  Discussed interpretation of tests and management recommendations with the primary team   Elder Negus,  MD Pager: [phone] Office: [phone]

## 2023-01-29 NOTE — Plan of Care (Signed)

## 2023-01-29 NOTE — Progress Notes (Signed)
Attempted to call pager number listed for [facility] to clarify pre-cath IVF order. Awaiting further orders.  Bari Edward, RN

## 2023-01-29 NOTE — Progress Notes (Addendum)
Subjective:  Has a lot of anxiety Has had intermittent episodes retrosternal burning, but dyspnea has improved  Seen by Dr. Leafy Ro, felt not to be a candidate for CABG+SAVR, primarily due to her anxiety   Current Facility-Administered Medications:    0.9 %  sodium chloride infusion, 250 mL, Intravenous, PRN, Norins, Rosalyn Gess, MD   0.9 %  sodium chloride infusion, 250 mL, Intravenous, PRN, ,  J, MD   [EXPIRED] 0.9% sodium chloride infusion, 3 mL/kg/hr, Intravenous, Continuous **FOLLOWED BY** 0.9% sodium chloride infusion, 1 mL/kg/hr, Intravenous, Continuous, ,  J, MD   acetaminophen (TYLENOL) tablet 1,000 mg, 1,000 mg, Oral, Q6H PRN, Norins, Rosalyn Gess, MD, 1,000 mg at 01/28/23 9485   acetaminophen (TYLENOL) tablet 650 mg, 650 mg, Oral, Q4H PRN, ,  J, MD   ALPRAZolam Prudy Feeler) tablet 1 mg, 1 mg, Oral, TID, John Giovanni, MD, 1 mg at 01/29/23 0847   alum & mag hydroxide-simeth (MAALOX/MYLANTA) 200-200-20 MG/5ML suspension 30 mL, 30 mL, Oral, Q6H PRN, Pokhrel, Laxman, MD, 30 mL at 01/29/23 4627   aspirin chewable tablet 81 mg, 81 mg, Oral, Daily, Norins, Rosalyn Gess, MD, 81 mg at 01/28/23 0911   busPIRone (BUSPAR) tablet 5 mg, 5 mg, Oral, BID, Norins, Rosalyn Gess, MD, 5 mg at 01/29/23 0847   butalbital-acetaminophen-caffeine (FIORICET) 50-325-40 MG per tablet 1 tablet, 1 tablet, Oral, Once, Pokhrel, Laxman, MD   calcium carbonate (TUMS - dosed in mg elemental calcium) chewable tablet 200 mg of elemental calcium, 1 tablet, Oral, TID PRN, Tolia, Sunit, DO   enoxaparin (LOVENOX) injection 40 mg, 40 mg, Subcutaneous, Q24H, Norins, Rosalyn Gess, MD, 40 mg at 01/28/23 0350   furosemide (LASIX) injection 40 mg, 40 mg, Intravenous, Daily, Pokhrel, Laxman, MD, 40 mg at 01/29/23 0846   losartan (COZAAR) tablet 25 mg, 25 mg, Oral, Daily, Norins, Rosalyn Gess, MD, 25 mg at 01/29/23 0847   nitroGLYCERIN (NITROSTAT) SL tablet 0.4 mg, 0.4 mg, Sublingual, Q5 min PRN,  Norins, Rosalyn Gess, MD   omega-3 acid ethyl esters (LOVAZA) capsule 1 g, 1 g, Oral, Daily, Norins, Rosalyn Gess, MD, 1 g at 01/29/23 0847   ondansetron (ZOFRAN) injection 4 mg, 4 mg, Intravenous, Q6H PRN, ,  J, MD   Oral care mouth rinse, 15 mL, Mouth Rinse, PRN, Norins, Rosalyn Gess, MD   pantoprazole (PROTONIX) EC tablet 40 mg, 40 mg, Oral, Daily, Norins, Rosalyn Gess, MD, 40 mg at 01/29/23 0847   potassium chloride SA (KLOR-CON M) CR tablet 40 mEq, 40 mEq, Oral, BID, Pokhrel, Laxman, MD, 40 mEq at 01/29/23 0847   rosuvastatin (CRESTOR) tablet 20 mg, 20 mg, Oral, Daily, Norins, Rosalyn Gess, MD   sodium chloride flush (NS) 0.9 % injection 3 mL, 3 mL, Intravenous, Q12H, Norins, Rosalyn Gess, MD, 3 mL at 01/28/23 0913   sodium chloride flush (NS) 0.9 % injection 3 mL, 3 mL, Intravenous, PRN, Norins, Rosalyn Gess, MD   sodium chloride flush (NS) 0.9 % injection 3 mL, 3 mL, Intravenous, Q12H, ,  J, MD, 3 mL at 01/29/23 0848   sodium chloride flush (NS) 0.9 % injection 3 mL, 3 mL, Intravenous, PRN, ,  J, MD   spironolactone (ALDACTONE) tablet 25 mg, 25 mg, Oral, Daily, Tolia, Sunit, DO, 25 mg at 01/29/23 0847   Objective:  Vital Signs in the last 24 hours: Temp:  [97.8 F (36.6 C)-98.7 F (37.1 C)] 97.8 F (36.6 C) (08/09 0506) Pulse Rate:  [79-87] 87 (08/09 0506) Resp:  [15-18] 18 (08/09 0506) BP: (109-154)/(69-91) 154/91 (  08/09 0506) SpO2:  [95 %-96 %] 96 % (08/09 0506) Weight:  [93.6 kg] 93.6 kg (08/09 0503)  Intake/Output from previous day: 08/08 0701 - 08/09 0700 In: 360 [P.O.:360] Out: 1600 [Urine:1600]  Physical Exam Vitals and nursing note reviewed.  Constitutional:      General: She is not in acute distress. Neck:     Vascular: No JVD.  Cardiovascular:     Rate and Rhythm: Normal rate and regular rhythm.     Heart sounds: Murmur heard.     Harsh midsystolic murmur is present with a grade of 3/6 at the upper right sternal border radiating to  the neck.  Pulmonary:     Effort: Pulmonary effort is normal.     Breath sounds: Normal breath sounds. No wheezing or rales.      Imaging/tests reviewed and independently interpreted: CTA chest 01/28/2023: 1. Vascular findings and measurements pertinent to potential TAVR procedure, as detailed above. 2. Thickening and calcification of the aortic valve, compatible with reported clinical history of aortic stenosis. 3. Moderate to severe aortoiliac atherosclerosis. Coronary artery calcifications of the LAD and RCA.    Cardiac Studies:  Telemetry 01/29/2023: No significant arrhythmia  LHC/RHC 01/27/2023: LM: Normal LAD: 95% mid LAD stenosis with mild calcification and moderate tortuosity (New since 2021)          40% downstream lesion in mid LAD (New since 2021)          Otherwise mild diffuse disease Lcx: Mid diffuse 50% disease (Previously deemed 80% in 2021) Ramus: Patent stent. No restenosis RCA: Mid eccentric 40% disease. Rest mild diffuse disease   RA: 4 mmHg RV: 31/2 mmHg PA: 23/10 mmHg, mPAP 14 mmHg PCW: 4 mmHg   LV: 132/0 mmHg Ao: 101/59 mmHg CO: 5.5 L/min CI: 2.7 L/min/m2   AoV mean PG 29 mmHg AVA 1.0 cm2, AVAi 0.5 cm2/.m2   Exertional angina and dyspnea Single vessel obstructive disease  Compensated ischemic or valvular cardiomyopathy (EF 30-35% with akinetic apex and septum on echocardiogram) Probable low flow low gradient severe AS GDMT for HFrEF likely limited by low blood pressure in the setting of severe AS   Will discuss with multidisciplinary heart team re: GDMT for HFrEF + PCI and TAVR vs CABG (LIMA-LAD) + AVR   EKG 01/23/2023: Sinus tachycardia Ventricular premature complex LAE, consider biatrial enlargement Left anterior fascicular block Abnormal R-wave progression, early transition Borderline repolarization abnormality Confirmed by Virgina Norfolk [phone]) on 8/  Echocardiogram 01/25/2023:  1. Left ventricular ejection fraction, by estimation, is  30 to 35%. The  left ventricle has moderately decreased function. The left ventricle  demonstrates regional wall motion abnormalities (see scoring  diagram/findings for description). There is mild  left ventricular hypertrophy. Indeterminate diastolic filling due to E-A  fusion. The entire septum and apex are hypokinetic.   2. Right ventricular systolic function is low normal. The right  ventricular size is normal.   3. The mitral valve is grossly normal. No evidence of mitral valve  regurgitation.   4. Native valve, moderate to severe calcification, reduced leaflet  excursion, trace aortic regurgitation, mild to moderate aortic stenosis  (peak velocity 2.31m/s, Peak Gradient 33 mmHg, Mean gradient 20 mmHg, AVA  VTI 0.93cm2, DI 0.33, SVi 25cc (severity   of aortic stenosis is likely underestimated due to low flow - low  gradient AS).   5. The inferior vena cava is normal in size with greater than 50%  respiratory variability, suggesting right atrial pressure of 3 mmHg.  6. Rhythm strip during this exam demonstrates normal sinus rhythm.   Comparison(s): Prior study 05/25/2022: LVEF 55-60%, Grade I diastolic  dysfunction, mild to moderate AS, see report for more details.      Assessment & Recommendations:  67 year old Caucasian female with hypertension, hyperlipidemia, CAD, h/o right carotid endarterectomy, h/o breast cancer, with new diagnosis of HFrEF, moderate aortic stenosis  HFrEF: Likely combination of valvular and ischemic cardiomyopathy. She remains very reluctant to add more medications, something that I would recommend for GFMT for HFrEF. At the very least, continue spironolactone and losartan.  There is also some limitation and uptitrating GDMT given her low normal blood pressures, and presence of likely low-flow low gradient severe attic stenosis.  Aortic stenosis: Very likely low-flow low gradient severe restenosis.  Small possibility remains that if and when her EF  were to improve, that her aortic stenosis area could increase, if this is indeed pseudo stenosis.  TAVR team is involved, with potential plan for TAVR in the near future outpatient.  Patient remains very upset that TAVR will not be performed inpatient.  I have explained to her to best of my ability, that this is not an emergent procedure, and most of the tablets are performed outpatient.  Continue treatment of heart failure and CAD remains key at this time, until TAVR can be performed.  CAD: Progressive exertional angina symptoms, with also some episodes of intermittent unstable angina. I discussed this with the surgery/TAVR team, as well as a multidisciplinary heart team meeting.  Consensus was to proceed with LAD PCI, with TAVR in the near future. I discussed risk, benefits, alternate options at length with the patient.  Patient remains adamant that she did not take Plavix after her last PCI and would not like to take it again.  I have reviewed the chart, and I disagree with her understanding, she was clearly on Plavix after her last PCI.  She is reluctant to take Brilinta, but is fine recommend to take Plavix after upcoming LAD PCI.  We will proceed with LAD PCI this afternoon.  I do not think she will need hemodynamic support, and should be a noncomplex PCI to her LAD.  Will load with 600 mg Plavix now.  Patient may eat light breakfast and be NPO from 11 AM.  From my conversations with the patient, it is evident that working is extremely important for financial reasons, as well as for her sanity.  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  I spoke to patient's daughter Cornell Barman over the phone and explained our recommendations.  Discussed interpretation of tests and management recommendations with the primary team   Elder Negus,  MD Pager: [phone] Office: [phone]

## 2023-01-29 NOTE — Interval H&P Note (Signed)
History and Physical Interval Note:  01/29/2023 1:51 PM  [place]  has presented today for surgery, with the diagnosis of HF, Unstable Angina.  The various methods of treatment have been discussed with the patient and family. After consideration of risks, benefits and other options for treatment, the patient has consented to  Procedure(s): CORONARY STENT INTERVENTION (N/A) as a surgical intervention.  The patient's history has been reviewed, patient examined, no change in status, stable for surgery.  I have reviewed the patient's chart and labs.  Questions were answered to the patient's satisfaction.       J 

## 2023-01-29 NOTE — Progress Notes (Signed)
PROGRESS NOTE    [place]  XBM:[id number] DOB: Oct 02, 1955 DOA: 01/23/2023 PCP: [name], MD    Brief Narrative:   Mrs. Carlile, is a 67 years old female with  past  medical  history of PSVT, anxiety/depression, breast cancer status post lumpectomy, chemo and radiation in 2009, hypertension, hyperlipidemia, right carotid artery stenosis status post carotid endarterectomy in 2020, CAD status post stenting, mild to moderate aortic stenosis, GERD presented to the ED with complaints of shortness of breath, weight gain, and generalized weakness.  Patient was slightly tachycardic on arrival but afebrile.  In the ED, patient was hypoxic with pulse ox of 90s on room air.  Labs significant for sodium 128, chloride 91, creatinine 0.7, BNP 462, troponin 35> 38, COVID/influenza/RSV PCR negative.  Patient has been non-adherent to medical recommendations.  Initial labs showed hyponatremia with sodium of 128, BNP 462, Troponin  35 to 38. CTA chest - no PE, bronchial wall thickening, emphysema, CAD, no pulmonary edema. CXR  did not see any infiltrate., EKG showed PVC, LAE, LAFB minimal ST elevation.  Patient was then admitted to the hospital for further evaluation and treatment.  Assessment and Plan:  Acute systolic CHF History of CAD with stents. Severe aortic stenosis.  BNP mildly elevated at 462. Troponin x 2 at 35 and flat.  History of pneumonia in the past.. CT angiogram of chest showed no evidence of pulmonary embolism but mild bronchial wall thickening.  Previous stress test from 08/18/2022 with LV ejection fraction of 55%.   2D echocardiogram this admission with LV ejection fraction of 30 to 35% with regional wall motion abnormality.  Cardiology on board and patient underwent cardiac catheterization on 01/27/2023 with findings of a mid LAD 95% lesion.  At this time, cardiothoracic surgery has been consulted due to severe aortic stenosis and possible need for CABG versus PCI.  Patient has undergone  CT angiogram of the chest, CT coronaries and CT angio of the abdomen and pelvis.  Follow cardiovascular and cardiology recommendations.  Anxiety disorder Follows up with psychiatry - [facility])  Buspar has been prescribed which she hasn't taken. Continue Xanax  taper as BuSpar takes affect. Would recommend outpatient follow-up with psychiatry.    Encouraged on  deep breathing exercises and relaxation.          Pure hypercholesterolemia On Crestor.  Hypokalemia.  Improved.  Latest potassium of 4.5.  Mild hyponatremia likely secondary to congestive heart failure and diuretics.  Closely monitor.  Sodium level at 130 today.  Continue to monitor.    Hypertension Patient is on losartan/Hct , continue losartan and amlodipine.  Discontinued HCTZ.   DVT prophylaxis: SCD's Start: 01/27/23 1832 enoxaparin (LOVENOX) injection 40 mg Start: 01/24/23 1200   Code Status:     Code Status: Full Code  Disposition:  Home likely in 1 to 2 days when okay with cardiology cardiothoracic surgery.  Status is: Inpatient  Remains inpatient appropriate because: Decompensated heart failure, anxiety, status post cardiac catheterization with LAD stenosis, CT surgery opinion.   Family Communication:  I spoke with the patient's daughter on the phone on 01/27/2023.  Consultants:  Cardiology  Procedures:  Cardiac catheterization 01/27/2023.  Antimicrobials:  None  Anti-infectives (From admission, onward)    None      Subjective:  Today, patient was seen and examined at bedside.  Upset about losing her phone.  Anxious about different things.  Denies any chest pain, nausea, vomiting, shortness of breath.    Objective: Vitals:  01/28/23 1324 01/28/23 2037 01/29/23 0503 01/29/23 0506  BP: 110/69 109/75  (!) 154/91  Pulse: 85 79  87  Resp: 15 15  18   Temp: 98.3 F (36.8 C) 98.7 F (37.1 C)  97.8 F (36.6 C)  TempSrc: Oral Oral  Oral  SpO2: 95% 95%  96%  Weight:   93.6 kg   Height:         Intake/Output Summary (Last 24 hours) at 01/29/2023 1054 Last data filed at 01/29/2023 0200 Gross per 24 hour  Intake 360 ml  Output 1600 ml  Net -1240 ml   Filed Weights   01/27/23 0700 01/28/23 0635 01/29/23 0503  Weight: 93.4 kg 93.8 kg 93.6 kg    Physical Examination: Body mass index is 31.38 kg/m.   General: Obese built, not in obvious distress on nasal cannula oxygen, anxious HENT:   No scleral pallor or icterus noted. Oral mucosa is moist.  Chest: Diminished breath sounds bilaterally.     CVS: S1 &S2 heard.  Systolic murmur  Abdomen: Soft, nontender, nondistended.  Bowel sounds are heard.   Extremities: No cyanosis, clubbing ,peripheral pulses are palpable. Psych: Alert, awake and oriented, appears anxious. CNS:  No cranial nerve deficits.  Power equal in all extremities.   Skin: Warm and dry.  No rashes noted.  Data Reviewed:   CBC: Recent Labs  Lab 01/23/23 2208 01/26/23 0311 01/27/23 1737 01/27/23 2321 01/29/23 0614  WBC 7.2 7.8  --  6.3 6.2  NEUTROABS 5.0  --   --   --   --   HGB 12.8 13.9 14.6  15.0 14.0 13.9  HCT 36.7 41.1 43.0  44.0 42.5 40.6  MCV 91.5 92.4  --  95.1 93.8  PLT 333 362  --  413* 387    Basic Metabolic Panel: Recent Labs  Lab 01/26/23 0311 01/27/23 0205 01/27/23 1737 01/27/23 2321 01/28/23 0935 01/29/23 0614  NA 126* 129* 133*  133* 131* 131* 130*  K 3.9 4.1 4.4  4.5 5.2* 4.2 4.5  CL 92* 95*  --  100 98 97*  CO2 24 25  --  20* 25 23  GLUCOSE 117* 108*  --  135* 113* 113*  BUN 18 17  --  18 16 20   CREATININE 0.84 0.79  --  0.70 0.67 0.81  CALCIUM 8.4* 8.5*  --  9.0 9.0 8.8*  MG 2.3 2.4  --  2.1  --  2.2    Liver Function Tests: Recent Labs  Lab 01/23/23 2208  AST 26  ALT 27  ALKPHOS 47  BILITOT 0.8  PROT 6.5  ALBUMIN 3.9     Radiology Studies: CT CORONARY MORPH W/CTA COR W/SCORE W/CA W/CM &/OR WO/CM  Addendum Date: 01/28/2023   ADDENDUM REPORT: 01/28/2023 17:01 CLINICAL DATA:  Aortic stenosis EXAM:  Cardiac TAVR CT TECHNIQUE: The patient was scanned on a Siemens Force 192 slice scanner. A 120 kV retrospective scan was triggered in the descending thoracic aorta at 111 HU's. Gantry rotation speed was 270 msecs and collimation was .9 mm. No beta blockade or nitro were given. The 3D data set was reconstructed in 5% intervals of the R-R cycle. Systolic and diastolic phases were analyzed on a dedicated work station using MPR, MIP and VRT modes. The patient received 80 cc of contrast. FINDINGS: Aortic Valve: Tri leaflet aortic valve calcified with restricted motion Calcium Score 2137 Large area of bulky calcification involving the non coronary cusp Aorta: No aneurysm moderate calcific atherosclerosis Sinotubular Junction: 23  mm Ascending Thoracic Aorta: 32 mm Aortic Arch: 30 mm Descending Thoracic Aorta: 23 mm Sinus of Valsalva Measurements: Non-coronary: 30 mm   height 21 mm Right - coronary: 29.4 mm  height 19 mm Left - coronary: 28.1 mm   height 20.5 mm Coronary Artery Height above Annulus: Left Main: 13.9 mm above annulus Right Coronary: 15.3 mm above annulus Virtual Basal Annulus Measurements: Maximum/Minimum Diameter: 25.1 mm x 22.3 mm Average diameter 23.7 mm Perimeter: 75.2 mm Area: 440 mm2 Coronary Arteries: Sufficient height above annulus for deployment Optimum Fluoroscopic Angle for Delivery: LAO 1 Caudal 2 degrees Membranous septal length 5.3 mm IMPRESSION: 1. Calcified tri leaflet AV with score 2137 Bulky calcium noted on non coronary cusp 2. Annular area 440 mm2 suitable on lower end for 26 mm Sapien Ultra valve. Left coronary sinus small to consider 29 mm Medtronic Evolut 3.  Optimum angiographic angle for deployment LAO 1 Caudal 2 degrees 4.  Membranous septal length 5.3 mm 5.  Coronary arteries sufficient height above annulus for deployment Charlton Haws Electronically Signed   By: Charlton Haws M.D.   On: 01/28/2023 17:01   Result Date: 01/28/2023 EXAM: OVER-READ INTERPRETATION  CT CHEST The  following report is a limited chest CT over-read performed by radiologist Dr. Allegra Lai of [facility] on 01/28/2023. This over-read does not include interpretation of cardiac or coronary anatomy or pathology. The cardiac TAVR interpretation by the cardiologist is attached. COMPARISON:  None Available. FINDINGS: Extracardiac findings will be described separately under dictation for contemporaneously obtained CTA chest, abdomen and pelvis. IMPRESSION: Please see separate dictation for contemporaneously obtained CTA chest, abdomen and pelvis dated 01/28/2023 for full description of relevant extracardiac findings. Electronically Signed: By: Allegra Lai M.D. On: 01/28/2023 16:24   CT Angio Abd/Pel w/ and/or w/o  Result Date: 01/28/2023 CLINICAL DATA:  Aortic valve replacement preop evaluation EXAM: CT ANGIOGRAPHY CHEST, ABDOMEN AND PELVIS TECHNIQUE: Multidetector CT imaging through the chest, abdomen and pelvis was performed using the standard protocol during bolus administration of intravenous contrast. Multiplanar reconstructed images and MIPs were obtained and reviewed to evaluate the vascular anatomy. RADIATION DOSE REDUCTION: This exam was performed according to the departmental dose-optimization program which includes automated exposure control, adjustment of the mA and/or kV according to patient size and/or use of iterative reconstruction technique. CONTRAST:  OMNIPAQUE IOHEXOL 350 MG/ML SOLN COMPARISON:  Abdominal CT dated February 20, 2019; chest CTA dated January 24, 2019 FINDINGS: CTA CHEST FINDINGS Cardiovascular: Normal heart size. No pericardial effusion. Aortic valve thickening and calcifications. Normal caliber thoracic aorta with moderate atherosclerotic disease. Coronary artery calcifications of the LAD and RCA. Evidence of prior PCI of the first diagonal. Mild mitral annular calcifications. Mediastinum/Nodes: Esophagus and thyroid are unremarkable. Surgical clips of the  right axilla. Mild pericardial recess fluid. No enlarged lymph nodes seen in the chest. Lungs/Pleura: Central airways are patent. Mild centrilobular emphysema. No consolidation, pleural effusion or pneumothorax. Musculoskeletal: No aggressive appearing osseous lesions CTA ABDOMEN AND PELVIS FINDINGS Hepatobiliary: No focal liver abnormality is seen. No gallstones, gallbladder wall thickening, or biliary dilatation. Pancreas: Unremarkable. No pancreatic ductal dilatation or surrounding inflammatory changes. Spleen: Normal in size without focal abnormality. Adrenals/Urinary Tract: Multiple adenomas of the left-greater-than-right adrenal glands, unchanged when compared with the prior abdominal CT. No hydronephrosis. Bladder is unremarkable. Stomach/Bowel: Stomach is within normal limits. Severe diverticulosis. No evidence of bowel wall thickening, distention, or inflammatory changes. Vascular/lymphatic: Normal caliber abdominal aorta with moderate atherosclerotic disease. No enlarged lymph nodes seen  in the abdomen or pelvis. Reproductive: No adnexal masses. Other: Soft tissue stranding of the right groin, likely postprocedural. Surgical clips of the right breast. Musculoskeletal: Mild compression deformity of L1, likely chronic. Moderate degenerative disc disease of the lumbar spine with associated kyphotic curvature. No aggressive appearing osseous lesions. VASCULAR MEASUREMENTS PERTINENT TO TAVR: AORTA: Minimal Aortic Diameter-17.0 mm Severity of Aortic Calcification-moderate RIGHT PELVIS: Right Common Iliac Artery - Minimal Diameter-7.1 mm Tortuosity-mild Calcification-severe Right External Iliac Artery - Minimal Diameter-8.4 mm Tortuosity-mild Calcification-none Right Common Femoral Artery - Minimal Diameter-6.9 mm Tortuosity-none Calcification-mild LEFT PELVIS: Left Common Iliac Artery - Minimal Diameter-7.5 mm Tortuosity-mild Calcification-severe Left External Iliac Artery - Minimal Diameter-8.8 mm  Tortuosity-mild Calcification-none Left Common Femoral Artery - Minimal Diameter-4.9 mm Tortuosity-none Calcification-moderate Review of the MIP images confirms the above findings. IMPRESSION: 1. Vascular findings and measurements pertinent to potential TAVR procedure, as detailed above. 2. Thickening and calcification of the aortic valve, compatible with reported clinical history of aortic stenosis. 3. Moderate to severe aortoiliac atherosclerosis. Coronary artery calcifications of the LAD and RCA. Electronically Signed   By: Allegra Lai M.D.   On: 01/28/2023 16:51   CT ANGIO CHEST AORTA W/CM & OR WO/CM  Result Date: 01/28/2023 CLINICAL DATA:  Aortic valve replacement preop evaluation EXAM: CT ANGIOGRAPHY CHEST, ABDOMEN AND PELVIS TECHNIQUE: Multidetector CT imaging through the chest, abdomen and pelvis was performed using the standard protocol during bolus administration of intravenous contrast. Multiplanar reconstructed images and MIPs were obtained and reviewed to evaluate the vascular anatomy. RADIATION DOSE REDUCTION: This exam was performed according to the departmental dose-optimization program which includes automated exposure control, adjustment of the mA and/or kV according to patient size and/or use of iterative reconstruction technique. CONTRAST:  OMNIPAQUE IOHEXOL 350 MG/ML SOLN COMPARISON:  Abdominal CT dated February 20, 2019; chest CTA dated January 24, 2019 FINDINGS: CTA CHEST FINDINGS Cardiovascular: Normal heart size. No pericardial effusion. Aortic valve thickening and calcifications. Normal caliber thoracic aorta with moderate atherosclerotic disease. Coronary artery calcifications of the LAD and RCA. Evidence of prior PCI of the first diagonal. Mild mitral annular calcifications. Mediastinum/Nodes: Esophagus and thyroid are unremarkable. Surgical clips of the right axilla. Mild pericardial recess fluid. No enlarged lymph nodes seen in the chest. Lungs/Pleura: Central airways are  patent. Mild centrilobular emphysema. No consolidation, pleural effusion or pneumothorax. Musculoskeletal: No aggressive appearing osseous lesions CTA ABDOMEN AND PELVIS FINDINGS Hepatobiliary: No focal liver abnormality is seen. No gallstones, gallbladder wall thickening, or biliary dilatation. Pancreas: Unremarkable. No pancreatic ductal dilatation or surrounding inflammatory changes. Spleen: Normal in size without focal abnormality. Adrenals/Urinary Tract: Multiple adenomas of the left-greater-than-right adrenal glands, unchanged when compared with the prior abdominal CT. No hydronephrosis. Bladder is unremarkable. Stomach/Bowel: Stomach is within normal limits. Severe diverticulosis. No evidence of bowel wall thickening, distention, or inflammatory changes. Vascular/lymphatic: Normal caliber abdominal aorta with moderate atherosclerotic disease. No enlarged lymph nodes seen in the abdomen or pelvis. Reproductive: No adnexal masses. Other: Soft tissue stranding of the right groin, likely postprocedural. Surgical clips of the right breast. Musculoskeletal: Mild compression deformity of L1, likely chronic. Moderate degenerative disc disease of the lumbar spine with associated kyphotic curvature. No aggressive appearing osseous lesions. VASCULAR MEASUREMENTS PERTINENT TO TAVR: AORTA: Minimal Aortic Diameter-17.0 mm Severity of Aortic Calcification-moderate RIGHT PELVIS: Right Common Iliac Artery - Minimal Diameter-7.1 mm Tortuosity-mild Calcification-severe Right External Iliac Artery - Minimal Diameter-8.4 mm Tortuosity-mild Calcification-none Right Common Femoral Artery - Minimal Diameter-6.9 mm Tortuosity-none Calcification-mild LEFT PELVIS: Left Common Iliac Artery -  Minimal Diameter-7.5 mm Tortuosity-mild Calcification-severe Left External Iliac Artery - Minimal Diameter-8.8 mm Tortuosity-mild Calcification-none Left Common Femoral Artery - Minimal Diameter-4.9 mm Tortuosity-none Calcification-moderate Review  of the MIP images confirms the above findings. IMPRESSION: 1. Vascular findings and measurements pertinent to potential TAVR procedure, as detailed above. 2. Thickening and calcification of the aortic valve, compatible with reported clinical history of aortic stenosis. 3. Moderate to severe aortoiliac atherosclerosis. Coronary artery calcifications of the LAD and RCA. Electronically Signed   By: Allegra Lai M.D.   On: 01/28/2023 16:51   CARDIAC CATHETERIZATION  Result Date: 01/27/2023 Images from the original result were not included. LM: Normal LAD: 95% mid LAD stenosis with mild calcification and moderate tortuosity (New since 2021)          40% downstream lesion in mid LAD (New since 2021)          Otherwise mild diffuse disease Lcx: Mid diffuse 50% disease (Previously deemed 80% in 2021) Ramus: Patent stent. No restenosis RCA: Mid eccentric 40% disease. Rest mild diffuse disease RA: 4 mmHg RV: 31/2 mmHg PA: 23/10 mmHg, mPAP 14 mmHg PCW: 4 mmHg LV: 132/0 mmHg Ao: 101/59 mmHg CO: 5.5 L/min CI: 2.7 L/min/m2 AoV mean PG 29 mmHg AVA 1.0 cm2, AVAi 0.5 cm2/.m2 Exertional angina and dyspnea Single vessel obstructive disease Compensated ischemic or valvular cardiomyopathy (EF 30-35% with akinetic apex and septum on echocardiogram) Probable low flow low gradient severe AS GDMT for HFrEF likely limited by low blood pressure in the setting of severe AS Will discuss with multidisciplinary heart team re: GDMT for HFrEF + PCI and TAVR vs CABG (LIMA-LAD) + AVR Elder Negus, MD Pager: [phone] Office: [phone]      LOS: 5 days    Joycelyn Das, MD [facility] Available via Epic secure chat 7am-7pm After these hours, please refer to coverage provider listed on [email or web address] 01/29/2023, 10:54 AM

## 2023-01-30 DIAGNOSIS — F411 Generalized anxiety disorder: Secondary | ICD-10-CM | POA: Diagnosis not present

## 2023-01-30 DIAGNOSIS — I10 Essential (primary) hypertension: Secondary | ICD-10-CM | POA: Diagnosis not present

## 2023-01-30 DIAGNOSIS — E7800 Pure hypercholesterolemia, unspecified: Secondary | ICD-10-CM | POA: Diagnosis not present

## 2023-01-30 DIAGNOSIS — I5031 Acute diastolic (congestive) heart failure: Secondary | ICD-10-CM | POA: Diagnosis not present

## 2023-01-30 MED ORDER — POTASSIUM CHLORIDE CRYS ER 20 MEQ PO TBCR
20.0000 meq | EXTENDED_RELEASE_TABLET | Freq: Every day | ORAL | Status: DC
  Administered 2023-01-31 – 2023-02-02 (×3): 20 meq via ORAL
  Filled 2023-01-30 (×3): qty 1

## 2023-01-30 MED ORDER — POTASSIUM CHLORIDE CRYS ER 20 MEQ PO TBCR: 20.0000 meq | EXTENDED_RELEASE_TABLET | Freq: Two times a day (BID) | ORAL | Status: DC

## 2023-01-30 NOTE — Progress Notes (Signed)
CARDIAC REHAB PHASE I   PRE:  Rate/Rhythm: 95 NSR  BP:  Sitting: 142/93      SaO2: 96 RA  MODE:  Ambulation: 470 ft   AD:   no AD  POST:  Rate/Rhythm: 115 ST  BP:  Sitting: 147/88      SaO2: 97 RA  Pt amb with supervision assistance, pt denies CP and reports min-moderate SOB during amb and was returned to room w/o complaint.   Pt was educated on stent card, stent location, Antiplatelet and ASA use, wt restrictions, no baths/daily wash-ups, s/s of infection, ex guidelines, s/s to stop exercising, NTG use and calling 911, heart healthy and low na diet, risk factors and CRPII. Pt received materials on exercise, diet, and CRPII. Will refer to [facility].    Faustino Congress  ACSM-CEP 9:57 AM 01/30/2023    Service time is from 0858 to [phone].

## 2023-01-30 NOTE — Progress Notes (Signed)
PROGRESS NOTE    [place]ison [place]  NWG:[id number] DOB: November 25, 1955 DOA: 01/23/2023 PCP: [name], MD    Brief Narrative:   Mrs. Peinado, is a 67 years old female with  past  medical  history of PSVT, anxiety/depression, breast cancer status post lumpectomy, chemo and radiation in 2009, hypertension, hyperlipidemia, right carotid artery stenosis status post carotid endarterectomy in 2020, CAD status post stenting, mild to moderate aortic stenosis, GERD presented to the ED with complaints of shortness of breath, weight gain, and generalized weakness.  Patient was slightly tachycardic on arrival but afebrile.  In the ED, patient was hypoxic with pulse ox of 90s on room air.  Labs significant for sodium 128, chloride 91, creatinine 0.7, BNP 462, troponin 35> 38, COVID/influenza/RSV PCR negative.  Patient has been non-adherent to medical recommendations.  Initial labs showed hyponatremia with sodium of 128, BNP 462, Troponin  35 to 38. CTA chest - no PE, bronchial wall thickening, emphysema, CAD, no pulmonary edema. CXR  did not see any infiltrate., EKG showed PVC, LAE, LAFB minimal ST elevation.  Patient was then admitted to the hospital for further evaluation and treatment.  Assessment and Plan:  Acute systolic CHF History of CAD with stents. Severe aortic stenosis. CT angiogram of chest showed no evidence of pulmonary embolism but mild bronchial wall thickening.  Previous stress test from 08/18/2022 with LV ejection fraction of 55%.   2D echocardiogram this admission with LV ejection fraction of 30 to 35% with regional wall motion abnormality.  Cardiology on board and patient underwent cardiac catheterization on 01/27/2023 with findings of a mid LAD 95% lesion. Cardiothoracic surgery was consulted due to  severe aortic stenosis and possible need for CABG versus PCI.  At this time, patient has undergone the repeat cardiac catheterization with LAD stent placement.  Plan for TAVR as outpatient.  Status  post CT angiogram of the chest, CT coronaries and CT angio of the abdomen and pelvis.  Will need to follow-up with cardiology and cardiovascular surgery as outpatient.  Anxiety disorder Extremely anxious.  On BuSpar and Xanax.  Xanax temporarily.  Follows up with psychiatry Dr. Jennette Kettle group as outpatient.   Pure hypercholesterolemia On Crestor.  Hypokalemia.  Follow-up  Mild hyponatremia  mild we will continue to monitor  Hypertension Patient is on losartan/Hct , continue losartan and amlodipine.  Discontinued HCTZ.   DVT prophylaxis: SCD's Start: 01/29/23 1638 SCD's Start: 01/27/23 1832 enoxaparin (LOVENOX) injection 40 mg Start: 01/24/23 1200   Code Status:     Code Status: Full Code  Disposition:  Home likely on 01/31/2023.  Status is: Inpatient  Remains inpatient appropriate because:  heart failure, anxiety, status post cardiac catheterization with LAD stenosis, status post stent placement,    Family Communication:  I spoke with the patient's daughter on the phone on 01/27/2023.  Consultants:  Cardiology  Procedures:  Cardiac catheterization 01/27/2023. Cardiac catheterization with PCI of LAD on 01/29/2023  Antimicrobials:  None  Anti-infectives (From admission, onward)    None      Subjective:  Today, patient was seen and examined at bedside.  Denies any chest pain, shortness of breath, fever, chills or rigor.  Feels extremely anxious about going home.  Felt upset when she had mild tachycardia.  Objective: Vitals:   01/29/23 1615 01/29/23 2000 01/30/23 0534 01/30/23 0856  BP: (!) 135/94 115/77 128/87 (!) 142/93  Pulse: 81 90 88 95  Resp: 20 13 20 16   Temp:  98 F (36.7 C) 97.7 F (36.5  C) 98.5 F (36.9 C)  TempSrc:  Oral Oral Oral  SpO2: 97% 96% 94%   Weight:   93.8 kg   Height:        Intake/Output Summary (Last 24 hours) at 01/30/2023 1433 Last data filed at 01/29/2023 1526 Gross per 24 hour  Intake 4.18 ml  Output --  Net 4.18 ml   Filed Weights    01/28/23 0635 01/29/23 0503 01/30/23 0534  Weight: 93.8 kg 93.6 kg 93.8 kg    Physical Examination: Body mass index is 31.44 kg/m.   General: Obese built, not in obvious distress on nasal cannula oxygen, anxious HENT:   No scleral pallor or icterus noted. Oral mucosa is moist.  Chest: Diminished breath sounds bilaterally.  Clear breath sounds.   CVS: S1 &S2 heard.  Systolic murmur  Abdomen: Soft, nontender, nondistended.  Bowel sounds are heard.   Extremities: No cyanosis, clubbing ,peripheral pulses are palpable. Psych: Alert, awake and oriented, appears anxious. CNS:  No cranial nerve deficits.  Power equal in all extremities.   Skin: Warm and dry.  No rashes noted.  Data Reviewed:   CBC: Recent Labs  Lab 01/23/23 2208 01/26/23 0311 01/27/23 1737 01/27/23 2321 01/29/23 0614 01/29/23 1658 01/30/23 0322  WBC 7.2 7.8  --  6.3 6.2 6.8 6.9  NEUTROABS 5.0  --   --   --   --   --   --   HGB 12.8 13.9 14.6  15.0 14.0 13.9 14.2 13.5  HCT 36.7 41.1 43.0  44.0 42.5 40.6 42.0 40.3  MCV 91.5 92.4  --  95.1 93.8 93.8 97.1  PLT 333 362  --  413* 387 377 405*    Basic Metabolic Panel: Recent Labs  Lab 01/26/23 0311 01/27/23 0205 01/27/23 1737 01/27/23 2321 01/28/23 0935 01/29/23 0614 01/29/23 1658 01/30/23 0322  NA 126* 129* 133*  133* 131* 131* 130*  --  132*  K 3.9 4.1 4.4  4.5 5.2* 4.2 4.5  --  4.8  CL 92* 95*  --  100 98 97*  --  100  CO2 24 25  --  20* 25 23  --  24  GLUCOSE 117* 108*  --  135* 113* 113*  --  109*  BUN 18 17  --  18 16 20   --  22  CREATININE 0.84 0.79  --  0.70 0.67 0.81 0.76 0.70  CALCIUM 8.4* 8.5*  --  9.0 9.0 8.8*  --  8.8*  MG 2.3 2.4  --  2.1  --  2.2  --  2.2    Liver Function Tests: Recent Labs  Lab 01/23/23 2208  AST 26  ALT 27  ALKPHOS 47  BILITOT 0.8  PROT 6.5  ALBUMIN 3.9     Radiology Studies: CARDIAC CATHETERIZATION  Addendum Date: 01/30/2023   Coronary intervention 01/29/2023: (Please see diagnostic coronary  angiogram report 01/27/2023) Mid LAD 95% stenosis Successful percutaneous coronary intervention mid LAD        PTCA and stent placement 3.0 X 22 mm Onyx Frontier drug-eluting stent        Post dilatation using 3.25 X 12 mm [place] balloon up tp 18 atm 0% residual stenosis, TIMI flow III Elder Negus, MD Pager: [phone] Office: [phone]   Result Date: 01/30/2023 Images from the original result were not included. Coronary intervention 01/29/2023: (Please see diagnostic coronary angiogram report 01/27/2023) Mid LAD 95% stenosis Successful percutaneous coronary intervention mid LAD        PTCA  and stent placement 3.0 X 22 mm Onyx Frontier drug-eluting stent        Post dilatation using 3.25 X 12 mm [place] balloon up tp 18 atm 0% residual stenosis, TIMI flow III Elder Negus, MD Pager: [phone] Office: [phone]  DG Orthopantogram  Result Date: 01/29/2023 CLINICAL DATA:  Poor dentition. EXAM: ORTHOPANTOGRAM/PANORAMIC COMPARISON:  None Available. FINDINGS: A large dental caries is present in the residual first right maxillary molar, tooth 3. Metallic fillings are present bilaterally. No periapical lucencies are present. Root canals are present in teeth numbers 20, 28, 30 and 31. Mandible is unremarkable.  Visualized paranasal sinuses are clear. IMPRESSION: Large dental caries in the residual first right maxillary molar, tooth 3. Multiple metallic fillings and root canals as described. Electronically Signed   By: Marin Roberts M.D.   On: 01/29/2023 17:56   CT CORONARY MORPH W/CTA COR W/SCORE W/CA W/CM &/OR WO/CM  Addendum Date: 01/28/2023   ADDENDUM REPORT: 01/28/2023 17:01 CLINICAL DATA:  Aortic stenosis EXAM: Cardiac TAVR CT TECHNIQUE: The patient was scanned on a Siemens Force 192 slice scanner. A 120 kV retrospective scan was triggered in the descending thoracic aorta at 111 HU's. Gantry rotation speed was 270 msecs and collimation was .9 mm. No beta blockade or nitro were given. The 3D  data set was reconstructed in 5% intervals of the R-R cycle. Systolic and diastolic phases were analyzed on a dedicated work station using MPR, MIP and VRT modes. The patient received 80 cc of contrast. FINDINGS: Aortic Valve: Tri leaflet aortic valve calcified with restricted motion Calcium Score 2137 Large area of bulky calcification involving the non coronary cusp Aorta: No aneurysm moderate calcific atherosclerosis Sinotubular Junction: 23 mm Ascending Thoracic Aorta: 32 mm Aortic Arch: 30 mm Descending Thoracic Aorta: 23 mm Sinus of Valsalva Measurements: Non-coronary: 30 mm   height 21 mm Right - coronary: 29.4 mm  height 19 mm Left - coronary: 28.1 mm   height 20.5 mm Coronary Artery Height above Annulus: Left Main: 13.9 mm above annulus Right Coronary: 15.3 mm above annulus Virtual Basal Annulus Measurements: Maximum/Minimum Diameter: 25.1 mm x 22.3 mm Average diameter 23.7 mm Perimeter: 75.2 mm Area: 440 mm2 Coronary Arteries: Sufficient height above annulus for deployment Optimum Fluoroscopic Angle for Delivery: LAO 1 Caudal 2 degrees Membranous septal length 5.3 mm IMPRESSION: 1. Calcified tri leaflet AV with score 2137 Bulky calcium noted on non coronary cusp 2. Annular area 440 mm2 suitable on lower end for 26 mm Sapien Ultra valve. Left coronary sinus small to consider 29 mm Medtronic Evolut 3.  Optimum angiographic angle for deployment LAO 1 Caudal 2 degrees 4.  Membranous septal length 5.3 mm 5.  Coronary arteries sufficient height above annulus for deployment Charlton Haws Electronically Signed   By: Charlton Haws M.D.   On: 01/28/2023 17:01   Result Date: 01/28/2023 EXAM: OVER-READ INTERPRETATION  CT CHEST The following report is a limited chest CT over-read performed by radiologist Dr. Allegra Lai of [facility] on 01/28/2023. This over-read does not include interpretation of cardiac or coronary anatomy or pathology. The cardiac TAVR interpretation by the cardiologist is attached.  COMPARISON:  None Available. FINDINGS: Extracardiac findings will be described separately under dictation for contemporaneously obtained CTA chest, abdomen and pelvis. IMPRESSION: Please see separate dictation for contemporaneously obtained CTA chest, abdomen and pelvis dated 01/28/2023 for full description of relevant extracardiac findings. Electronically Signed: By: Allegra Lai M.D. On: 01/28/2023 16:24   CT Angio Abd/Pel w/ and/or  w/o  Result Date: 01/28/2023 CLINICAL DATA:  Aortic valve replacement preop evaluation EXAM: CT ANGIOGRAPHY CHEST, ABDOMEN AND PELVIS TECHNIQUE: Multidetector CT imaging through the chest, abdomen and pelvis was performed using the standard protocol during bolus administration of intravenous contrast. Multiplanar reconstructed images and MIPs were obtained and reviewed to evaluate the vascular anatomy. RADIATION DOSE REDUCTION: This exam was performed according to the departmental dose-optimization program which includes automated exposure control, adjustment of the mA and/or kV according to patient size and/or use of iterative reconstruction technique. CONTRAST:  OMNIPAQUE IOHEXOL 350 MG/ML SOLN COMPARISON:  Abdominal CT dated February 20, 2019; chest CTA dated January 24, 2019 FINDINGS: CTA CHEST FINDINGS Cardiovascular: Normal heart size. No pericardial effusion. Aortic valve thickening and calcifications. Normal caliber thoracic aorta with moderate atherosclerotic disease. Coronary artery calcifications of the LAD and RCA. Evidence of prior PCI of the first diagonal. Mild mitral annular calcifications. Mediastinum/Nodes: Esophagus and thyroid are unremarkable. Surgical clips of the right axilla. Mild pericardial recess fluid. No enlarged lymph nodes seen in the chest. Lungs/Pleura: Central airways are patent. Mild centrilobular emphysema. No consolidation, pleural effusion or pneumothorax. Musculoskeletal: No aggressive appearing osseous lesions CTA ABDOMEN AND PELVIS  FINDINGS Hepatobiliary: No focal liver abnormality is seen. No gallstones, gallbladder wall thickening, or biliary dilatation. Pancreas: Unremarkable. No pancreatic ductal dilatation or surrounding inflammatory changes. Spleen: Normal in size without focal abnormality. Adrenals/Urinary Tract: Multiple adenomas of the left-greater-than-right adrenal glands, unchanged when compared with the prior abdominal CT. No hydronephrosis. Bladder is unremarkable. Stomach/Bowel: Stomach is within normal limits. Severe diverticulosis. No evidence of bowel wall thickening, distention, or inflammatory changes. Vascular/lymphatic: Normal caliber abdominal aorta with moderate atherosclerotic disease. No enlarged lymph nodes seen in the abdomen or pelvis. Reproductive: No adnexal masses. Other: Soft tissue stranding of the right groin, likely postprocedural. Surgical clips of the right breast. Musculoskeletal: Mild compression deformity of L1, likely chronic. Moderate degenerative disc disease of the lumbar spine with associated kyphotic curvature. No aggressive appearing osseous lesions. VASCULAR MEASUREMENTS PERTINENT TO TAVR: AORTA: Minimal Aortic Diameter-17.0 mm Severity of Aortic Calcification-moderate RIGHT PELVIS: Right Common Iliac Artery - Minimal Diameter-7.1 mm Tortuosity-mild Calcification-severe Right External Iliac Artery - Minimal Diameter-8.4 mm Tortuosity-mild Calcification-none Right Common Femoral Artery - Minimal Diameter-6.9 mm Tortuosity-none Calcification-mild LEFT PELVIS: Left Common Iliac Artery - Minimal Diameter-7.5 mm Tortuosity-mild Calcification-severe Left External Iliac Artery - Minimal Diameter-8.8 mm Tortuosity-mild Calcification-none Left Common Femoral Artery - Minimal Diameter-4.9 mm Tortuosity-none Calcification-moderate Review of the MIP images confirms the above findings. IMPRESSION: 1. Vascular findings and measurements pertinent to potential TAVR procedure, as detailed above. 2. Thickening  and calcification of the aortic valve, compatible with reported clinical history of aortic stenosis. 3. Moderate to severe aortoiliac atherosclerosis. Coronary artery calcifications of the LAD and RCA. Electronically Signed   By: Allegra Lai M.D.   On: 01/28/2023 16:51   CT ANGIO CHEST AORTA W/CM & OR WO/CM  Result Date: 01/28/2023 CLINICAL DATA:  Aortic valve replacement preop evaluation EXAM: CT ANGIOGRAPHY CHEST, ABDOMEN AND PELVIS TECHNIQUE: Multidetector CT imaging through the chest, abdomen and pelvis was performed using the standard protocol during bolus administration of intravenous contrast. Multiplanar reconstructed images and MIPs were obtained and reviewed to evaluate the vascular anatomy. RADIATION DOSE REDUCTION: This exam was performed according to the departmental dose-optimization program which includes automated exposure control, adjustment of the mA and/or kV according to patient size and/or use of iterative reconstruction technique. CONTRAST:  OMNIPAQUE IOHEXOL 350 MG/ML SOLN COMPARISON:  Abdominal CT dated  February 20, 2019; chest CTA dated January 24, 2019 FINDINGS: CTA CHEST FINDINGS Cardiovascular: Normal heart size. No pericardial effusion. Aortic valve thickening and calcifications. Normal caliber thoracic aorta with moderate atherosclerotic disease. Coronary artery calcifications of the LAD and RCA. Evidence of prior PCI of the first diagonal. Mild mitral annular calcifications. Mediastinum/Nodes: Esophagus and thyroid are unremarkable. Surgical clips of the right axilla. Mild pericardial recess fluid. No enlarged lymph nodes seen in the chest. Lungs/Pleura: Central airways are patent. Mild centrilobular emphysema. No consolidation, pleural effusion or pneumothorax. Musculoskeletal: No aggressive appearing osseous lesions CTA ABDOMEN AND PELVIS FINDINGS Hepatobiliary: No focal liver abnormality is seen. No gallstones, gallbladder wall thickening, or biliary dilatation. Pancreas:  Unremarkable. No pancreatic ductal dilatation or surrounding inflammatory changes. Spleen: Normal in size without focal abnormality. Adrenals/Urinary Tract: Multiple adenomas of the left-greater-than-right adrenal glands, unchanged when compared with the prior abdominal CT. No hydronephrosis. Bladder is unremarkable. Stomach/Bowel: Stomach is within normal limits. Severe diverticulosis. No evidence of bowel wall thickening, distention, or inflammatory changes. Vascular/lymphatic: Normal caliber abdominal aorta with moderate atherosclerotic disease. No enlarged lymph nodes seen in the abdomen or pelvis. Reproductive: No adnexal masses. Other: Soft tissue stranding of the right groin, likely postprocedural. Surgical clips of the right breast. Musculoskeletal: Mild compression deformity of L1, likely chronic. Moderate degenerative disc disease of the lumbar spine with associated kyphotic curvature. No aggressive appearing osseous lesions. VASCULAR MEASUREMENTS PERTINENT TO TAVR: AORTA: Minimal Aortic Diameter-17.0 mm Severity of Aortic Calcification-moderate RIGHT PELVIS: Right Common Iliac Artery - Minimal Diameter-7.1 mm Tortuosity-mild Calcification-severe Right External Iliac Artery - Minimal Diameter-8.4 mm Tortuosity-mild Calcification-none Right Common Femoral Artery - Minimal Diameter-6.9 mm Tortuosity-none Calcification-mild LEFT PELVIS: Left Common Iliac Artery - Minimal Diameter-7.5 mm Tortuosity-mild Calcification-severe Left External Iliac Artery - Minimal Diameter-8.8 mm Tortuosity-mild Calcification-none Left Common Femoral Artery - Minimal Diameter-4.9 mm Tortuosity-none Calcification-moderate Review of the MIP images confirms the above findings. IMPRESSION: 1. Vascular findings and measurements pertinent to potential TAVR procedure, as detailed above. 2. Thickening and calcification of the aortic valve, compatible with reported clinical history of aortic stenosis. 3. Moderate to severe aortoiliac  atherosclerosis. Coronary artery calcifications of the LAD and RCA. Electronically Signed   By: Allegra Lai M.D.   On: 01/28/2023 16:51      LOS: 6 days    Joycelyn Das, MD [facility] Available via Epic secure chat 7am-7pm After these hours, please refer to coverage provider listed on [email or web address] 01/30/2023, 2:33 PM

## 2023-01-30 NOTE — Plan of Care (Signed)

## 2023-01-30 NOTE — Progress Notes (Signed)
Subjective:  No chest pain/retrosternal burning. Ambulated without chest pain Still has exertional dyspnea    Current Facility-Administered Medications:    0.9 %  sodium chloride infusion, 250 mL, Intravenous, PRN, Norins, Rosalyn Gess, MD   0.9 %  sodium chloride infusion, , Intravenous, Continuous, ,  J, MD, Stopped at 01/29/23 1219   0.9 %  sodium chloride infusion, 250 mL, Intravenous, PRN, ,  J, MD   acetaminophen (TYLENOL) tablet 650 mg, 650 mg, Oral, Q4H PRN, ,  J, MD   ALPRAZolam Prudy Feeler) tablet 1 mg, 1 mg, Oral, TID, John Giovanni, MD, 1 mg at 01/30/23 0833   alum & mag hydroxide-simeth (MAALOX/MYLANTA) 200-200-20 MG/5ML suspension 30 mL, 30 mL, Oral, Q6H PRN, Pokhrel, Laxman, MD, 30 mL at 01/29/23 1645   aspirin chewable tablet 81 mg, 81 mg, Oral, Daily, Norins, Rosalyn Gess, MD, 81 mg at 01/30/23 0834   busPIRone (BUSPAR) tablet 5 mg, 5 mg, Oral, BID, Norins, Rosalyn Gess, MD, 5 mg at 01/30/23 1610   butalbital-acetaminophen-caffeine (FIORICET) 50-325-40 MG per tablet 1 tablet, 1 tablet, Oral, Once, Pokhrel, Laxman, MD   calcium carbonate (TUMS - dosed in mg elemental calcium) chewable tablet 200 mg of elemental calcium, 1 tablet, Oral, TID PRN, Tolia, Sunit, DO   clopidogrel (PLAVIX) tablet 75 mg, 75 mg, Oral, Q breakfast, ,  J, MD, 75 mg at 01/30/23 0834   enoxaparin (LOVENOX) injection 40 mg, 40 mg, Subcutaneous, Q24H, Norins, Rosalyn Gess, MD, 40 mg at 01/30/23 9604   furosemide (LASIX) injection 40 mg, 40 mg, Intravenous, Daily, Pokhrel, Laxman, MD, 40 mg at 01/30/23 5409   losartan (COZAAR) tablet 25 mg, 25 mg, Oral, Daily, Norins, Rosalyn Gess, MD, 25 mg at 01/30/23 8119   nitroGLYCERIN (NITROSTAT) SL tablet 0.4 mg, 0.4 mg, Sublingual, Q5 min PRN, Norins, Rosalyn Gess, MD   omega-3 acid ethyl esters (LOVAZA) capsule 1 g, 1 g, Oral, Daily, Norins, Rosalyn Gess, MD, 1 g at 01/30/23 0833   ondansetron (ZOFRAN) injection 4 mg, 4 mg,  Intravenous, Q6H PRN, ,  J, MD   Oral care mouth rinse, 15 mL, Mouth Rinse, PRN, Norins, Rosalyn Gess, MD   pantoprazole (PROTONIX) EC tablet 40 mg, 40 mg, Oral, Daily, Norins, Rosalyn Gess, MD, 40 mg at 01/30/23 1478   potassium chloride SA (KLOR-CON M) CR tablet 40 mEq, 40 mEq, Oral, BID, Pokhrel, Laxman, MD, 40 mEq at 01/30/23 2956   rosuvastatin (CRESTOR) tablet 20 mg, 20 mg, Oral, Daily, Norins, Rosalyn Gess, MD   sodium chloride flush (NS) 0.9 % injection 3 mL, 3 mL, Intravenous, Q12H, Norins, Rosalyn Gess, MD, 3 mL at 01/30/23 0834   sodium chloride flush (NS) 0.9 % injection 3 mL, 3 mL, Intravenous, PRN, Norins, Rosalyn Gess, MD   sodium chloride flush (NS) 0.9 % injection 3 mL, 3 mL, Intravenous, Q12H, ,  J, MD   sodium chloride flush (NS) 0.9 % injection 3 mL, 3 mL, Intravenous, PRN, ,  J, MD   spironolactone (ALDACTONE) tablet 25 mg, 25 mg, Oral, Daily, Tolia, Sunit, DO, 25 mg at 01/30/23 0833   Objective:  Vital Signs in the last 24 hours: Temp:  [97.7 F (36.5 C)-98.5 F (36.9 C)] 98.5 F (36.9 C) (08/10 0856) Pulse Rate:  [0-95] 95 (08/10 0856) Resp:  [13-33] 16 (08/10 0856) BP: (115-155)/(74-127) 142/93 (08/10 0856) SpO2:  [89 %-98 %] 94 % (08/10 0534) Weight:  [93.8 kg] 93.8 kg (08/10 0534)  Intake/Output from previous day: 08/09 0701 - 08/10 0700 In: 4.2 [  I.V.:4.2] Out: -   Physical Exam Vitals and nursing note reviewed.  Constitutional:      General: She is not in acute distress. Neck:     Vascular: No JVD.  Cardiovascular:     Rate and Rhythm: Normal rate and regular rhythm.     Heart sounds: Murmur heard.     Harsh midsystolic murmur is present with a grade of 3/6 at the upper right sternal border radiating to the neck.  Pulmonary:     Effort: Pulmonary effort is normal.     Breath sounds: Normal breath sounds. No wheezing or rales.      Imaging/tests reviewed and independently interpreted:  Coronary intervention  01/29/2023: (Please see diagnostic coronary angiogram report 01/27/2023) Mid LAD 95% stenosis Successful percutaneous coronary intervention mid LAD        PTCA and stent placement 3.0 X 22 mm Onyx Frontier drug-eluting stent        Post dilatation using 3.25 X 12 mm [place] balloon up tp 18 atm 0% residual stenosis, TIMI flow III          CTA chest 01/28/2023: 1. Vascular findings and measurements pertinent to potential TAVR procedure, as detailed above. 2. Thickening and calcification of the aortic valve, compatible with reported clinical history of aortic stenosis. 3. Moderate to severe aortoiliac atherosclerosis. Coronary artery calcifications of the LAD and RCA.    Cardiac Studies:  Telemetry 01/29/2023: No significant arrhythmia  LHC/RHC 01/27/2023: LM: Normal LAD: 95% mid LAD stenosis with mild calcification and moderate tortuosity (New since 2021)          40% downstream lesion in mid LAD (New since 2021)          Otherwise mild diffuse disease Lcx: Mid diffuse 50% disease (Previously deemed 80% in 2021) Ramus: Patent stent. No restenosis RCA: Mid eccentric 40% disease. Rest mild diffuse disease   RA: 4 mmHg RV: 31/2 mmHg PA: 23/10 mmHg, mPAP 14 mmHg PCW: 4 mmHg   LV: 132/0 mmHg Ao: 101/59 mmHg CO: 5.5 L/min CI: 2.7 L/min/m2   AoV mean PG 29 mmHg AVA 1.0 cm2, AVAi 0.5 cm2/.m2   Exertional angina and dyspnea Single vessel obstructive disease  Compensated ischemic or valvular cardiomyopathy (EF 30-35% with akinetic apex and septum on echocardiogram) Probable low flow low gradient severe AS GDMT for HFrEF likely limited by low blood pressure in the setting of severe AS   Will discuss with multidisciplinary heart team re: GDMT for HFrEF + PCI and TAVR vs CABG (LIMA-LAD) + AVR   EKG 01/23/2023: Sinus tachycardia Ventricular premature complex LAE, consider biatrial enlargement Left anterior fascicular block Abnormal R-wave progression, early transition Borderline  repolarization abnormality Confirmed by Virgina Norfolk [phone]) on 8/  Echocardiogram 01/25/2023:  1. Left ventricular ejection fraction, by estimation, is 30 to 35%. The  left ventricle has moderately decreased function. The left ventricle  demonstrates regional wall motion abnormalities (see scoring  diagram/findings for description). There is mild  left ventricular hypertrophy. Indeterminate diastolic filling due to E-A  fusion. The entire septum and apex are hypokinetic.   2. Right ventricular systolic function is low normal. The right  ventricular size is normal.   3. The mitral valve is grossly normal. No evidence of mitral valve  regurgitation.   4. Native valve, moderate to severe calcification, reduced leaflet  excursion, trace aortic regurgitation, mild to moderate aortic stenosis  (peak velocity 2.32m/s, Peak Gradient 33 mmHg, Mean gradient 20 mmHg, AVA  VTI 0.93cm2, DI 0.33, SVi 25cc (  severity   of aortic stenosis is likely underestimated due to low flow - low  gradient AS).   5. The inferior vena cava is normal in size with greater than 50%  respiratory variability, suggesting right atrial pressure of 3 mmHg.   6. Rhythm strip during this exam demonstrates normal sinus rhythm.   Comparison(s): Prior study 05/25/2022: LVEF 55-60%, Grade I diastolic  dysfunction, mild to moderate AS, see report for more details.      Assessment & Recommendations:  67 year old Caucasian female with hypertension, hyperlipidemia, CAD, h/o right carotid endarterectomy, h/o breast cancer, with new diagnosis of HFrEF, moderate aortic stenosis  HFrEF: Likely combination of valvular and ischemic cardiomyopathy. She remains very reluctant to add more medications, something that I would recommend for GFMT for HFrEF. At the very least, continue spironolactone and losartan.  She is willing to add metoprolol succinate 25 mg daily, but does not want to try any "new medications".  Aortic  stenosis: Very likely low-flow low gradient severe restenosis.  Small possibility remains that if and when her EF were to improve, that her aortic stenosis area could increase, if this is indeed pseudo stenosis.  TAVR team is involved, with potential plan for TAVR in the near future outpatient.  Patient remains very upset that TAVR will not be performed inpatient.  I have explained to her to best of my ability, that this is not an emergent procedure, and most of the tablets are performed outpatient.  Continue treatment of heart failure and CAD remains key at this time, until TAVR can be performed.  CAD: S/p mid LAD PCI Recommend Aspirin and plavix for at least 6 months, preferably 1 year. Added metoprolol succinate 25 mg daily. Currently on Crestor 20 mg daily (although it appears that patient is refusing it). Will need to consider adding Repatha/Leqvio outpatient.  TAVR will be outpatient. I think we can watch her for one more day to assess medication tolerance and discharge tomorrow.  Will arrange outpatient follow up with Dr. Neila Gear f/u w/Dr. Excell Seltzer for TAVR consult on 8/26.  Discussed interpretation of tests and management recommendations with the primary team   Elder Negus, MD Pager: [phone] Office: [phone]

## 2023-01-30 NOTE — Plan of Care (Signed)

## 2023-01-31 ENCOUNTER — Other Ambulatory Visit: Payer: Self-pay | Admitting: Internal Medicine

## 2023-01-31 ENCOUNTER — Inpatient Hospital Stay (HOSPITAL_COMMUNITY): Payer: Medicare Other

## 2023-01-31 ENCOUNTER — Encounter (HOSPITAL_COMMUNITY)
Admission: EM | Disposition: A | Discharge status: Rehabilitation Facility | Payer: Self-pay | Source: Home / Self Care | Attending: Pulmonary Disease

## 2023-01-31 ENCOUNTER — Other Ambulatory Visit: Payer: Self-pay

## 2023-01-31 DIAGNOSIS — I469 Cardiac arrest, cause unspecified: Secondary | ICD-10-CM

## 2023-01-31 DIAGNOSIS — T82867A Thrombosis of cardiac prosthetic devices, implants and grafts, initial encounter: Secondary | ICD-10-CM

## 2023-01-31 DIAGNOSIS — I472 Ventricular tachycardia, unspecified: Secondary | ICD-10-CM

## 2023-01-31 DIAGNOSIS — I5021 Acute systolic (congestive) heart failure: Secondary | ICD-10-CM | POA: Diagnosis not present

## 2023-01-31 DIAGNOSIS — F411 Generalized anxiety disorder: Secondary | ICD-10-CM | POA: Diagnosis not present

## 2023-01-31 DIAGNOSIS — I10 Essential (primary) hypertension: Secondary | ICD-10-CM | POA: Diagnosis not present

## 2023-01-31 DIAGNOSIS — E7800 Pure hypercholesterolemia, unspecified: Secondary | ICD-10-CM | POA: Diagnosis not present

## 2023-01-31 DIAGNOSIS — I5031 Acute diastolic (congestive) heart failure: Secondary | ICD-10-CM | POA: Diagnosis not present

## 2023-01-31 DIAGNOSIS — I2102 ST elevation (STEMI) myocardial infarction involving left anterior descending coronary artery: Secondary | ICD-10-CM

## 2023-01-31 HISTORY — PX: CORONARY/GRAFT ACUTE MI REVASCULARIZATION: CATH118305

## 2023-01-31 HISTORY — PX: CORONARY THROMBECTOMY: CATH118304

## 2023-01-31 HISTORY — PX: CORONARY ULTRASOUND/IVUS: CATH118244

## 2023-01-31 HISTORY — PX: LEFT HEART CATH AND CORONARY ANGIOGRAPHY: CATH118249

## 2023-01-31 LAB — POCT ACTIVATED CLOTTING TIME
Activated Clotting Time: 269 seconds
Activated Clotting Time: 440 seconds

## 2023-01-31 LAB — BASIC METABOLIC PANEL
Anion gap: 16 — ABNORMAL HIGH (ref 5–15)
BUN: 23 mg/dL (ref 8–23)
CO2: 19 mmol/L — ABNORMAL LOW (ref 22–32)
Calcium: 8.9 mg/dL (ref 8.9–10.3)
Chloride: 95 mmol/L — ABNORMAL LOW (ref 98–111)
Creatinine, Ser: 0.98 mg/dL (ref 0.44–1.00)
GFR, Estimated: >60 mL/min (ref >60)
Glucose, Bld: 178 mg/dL — ABNORMAL HIGH (ref 70–99)
Potassium: 5.5 mmol/L — ABNORMAL HIGH (ref 3.5–5.1)
Sodium: 130 mmol/L — ABNORMAL LOW (ref 135–145)

## 2023-01-31 LAB — MAGNESIUM: Magnesium: 2.6 mg/dL — ABNORMAL HIGH (ref 1.7–2.4)

## 2023-01-31 LAB — CBC
HCT: 46 % (ref 36.0–46.0)
Hemoglobin: 14.1 g/dL (ref 12.0–15.0)
MCH: 31.5 pg (ref 26.0–34.0)
MCHC: 30.7 g/dL (ref 30.0–36.0)
MCV: 102.7 fL — ABNORMAL HIGH (ref 80.0–100.0)
Platelets: 433 10*3/uL — ABNORMAL HIGH (ref 150–400)
RBC: 4.48 MIL/uL (ref 3.87–5.11)
RDW: 13.2 % (ref 11.5–15.5)
WBC: 13.7 10*3/uL — ABNORMAL HIGH (ref 4.0–10.5)
nRBC: 0 % (ref 0.0–0.2)

## 2023-01-31 LAB — GLUCOSE, CAPILLARY
Glucose-Capillary: 121 mg/dL — ABNORMAL HIGH (ref 70–99)
Glucose-Capillary: 175 mg/dL — ABNORMAL HIGH (ref 70–99)

## 2023-01-31 LAB — TROPONIN I (HIGH SENSITIVITY): Troponin I (High Sensitivity): 172 ng/L (ref <18)

## 2023-01-31 LAB — CREATININE, SERUM
Creatinine, Ser: 0.73 mg/dL (ref 0.44–1.00)
GFR, Estimated: >60 mL/min (ref >60)

## 2023-01-31 SURGERY — CORONARY/GRAFT ACUTE MI REVASCULARIZATION
Anesthesia: LOCAL

## 2023-01-31 MED ORDER — HEPARIN SODIUM (PORCINE) 1000 UNIT/ML IJ SOLN
INTRAMUSCULAR | Status: DC | PRN
  Administered 2023-01-31: 9000 [IU] via INTRAVENOUS
  Administered 2023-01-31: 2000 [IU] via INTRAVENOUS

## 2023-01-31 MED ORDER — AMIODARONE HCL IN DEXTROSE 360-4.14 MG/200ML-% IV SOLN
INTRAVENOUS | Status: AC
  Filled 2023-01-31: qty 200

## 2023-01-31 MED ORDER — HEPARIN (PORCINE) IN NACL 1000-0.9 UT/500ML-% IV SOLN
INTRAVENOUS | Status: DC | PRN
  Administered 2023-01-31 (×4): 500 mL

## 2023-01-31 MED ORDER — TIROFIBAN (AGGRASTAT) BOLUS VIA INFUSION
INTRAVENOUS | Status: DC | PRN
  Administered 2023-01-31: 2332.5 ug via INTRAVENOUS

## 2023-01-31 MED ORDER — FENTANYL CITRATE (PF) 100 MCG/2ML IJ SOLN
INTRAMUSCULAR | Status: DC | PRN
  Administered 2023-01-31: 25 ug via INTRAVENOUS
  Administered 2023-01-31: 50 ug via INTRAVENOUS

## 2023-01-31 MED ORDER — HEPARIN SODIUM (PORCINE) 1000 UNIT/ML IJ SOLN
4000.0000 [IU] | INTRAMUSCULAR | Status: AC
  Administered 2023-01-31: 4000 [IU] via INTRAVENOUS
  Filled 2023-01-31: qty 4

## 2023-01-31 MED ORDER — NITROGLYCERIN 1 MG/10 ML FOR IR/CATH LAB
INTRA_ARTERIAL | Status: DC | PRN
  Administered 2023-01-31: 100 ug via INTRACORONARY

## 2023-01-31 MED ORDER — MIDAZOLAM HCL 2 MG/2ML IJ SOLN
INTRAMUSCULAR | Status: DC | PRN
  Administered 2023-01-31: 1 mg via INTRAVENOUS

## 2023-01-31 MED ORDER — FENTANYL CITRATE (PF) 100 MCG/2ML IJ SOLN
INTRAMUSCULAR | Status: AC
  Filled 2023-01-31: qty 2

## 2023-01-31 MED ORDER — TIROFIBAN HCL IN NACL 5-0.9 MG/100ML-% IV SOLN
INTRAVENOUS | Status: AC | PRN
  Administered 2023-01-31: .15 ug/kg/min via INTRAVENOUS

## 2023-01-31 MED ORDER — TICAGRELOR 90 MG PO TABS
90.0000 mg | ORAL_TABLET | Freq: Two times a day (BID) | ORAL | Status: DC
  Administered 2023-02-01 – 2023-02-02 (×4): 90 mg via ORAL
  Filled 2023-01-31 (×4): qty 1

## 2023-01-31 MED ORDER — TICAGRELOR 90 MG PO TABS
180.0000 mg | ORAL_TABLET | Freq: Once | ORAL | Status: AC
  Administered 2023-01-31: 180 mg via ORAL
  Filled 2023-01-31: qty 2

## 2023-01-31 MED ORDER — FAMOTIDINE IN NACL 20-0.9 MG/50ML-% IV SOLN
INTRAVENOUS | Status: AC
  Administered 2023-01-31: 20 mg
  Filled 2023-01-31: qty 50

## 2023-01-31 MED ORDER — LIDOCAINE HCL (PF) 1 % IJ SOLN
INTRAMUSCULAR | Status: AC
  Filled 2023-01-31: qty 30

## 2023-01-31 MED ORDER — VERAPAMIL HCL 2.5 MG/ML IV SOLN
INTRAVENOUS | Status: DC | PRN
  Administered 2023-01-31: 5 mL via INTRA_ARTERIAL

## 2023-01-31 MED ORDER — METOPROLOL SUCCINATE ER 25 MG PO TB24
25.0000 mg | ORAL_TABLET | Freq: Every day | ORAL | Status: DC
  Administered 2023-01-31: 25 mg via ORAL
  Filled 2023-01-31 (×2): qty 1

## 2023-01-31 MED ORDER — IOHEXOL 350 MG/ML SOLN
INTRAVENOUS | Status: DC | PRN
  Administered 2023-01-31: 120 mL

## 2023-01-31 MED ORDER — NOREPINEPHRINE BITARTRATE 1 MG/ML IV SOLN
INTRAVENOUS | Status: DC | PRN
  Administered 2023-01-31: 1 ug/min via INTRAVENOUS
  Administered 2023-01-31: 2.5 ug/min via INTRAVENOUS

## 2023-01-31 MED ORDER — MIDAZOLAM HCL 2 MG/2ML IJ SOLN
INTRAMUSCULAR | Status: AC
  Filled 2023-01-31: qty 2

## 2023-01-31 MED ORDER — SODIUM CHLORIDE 0.9 % IV BOLUS
INTRAVENOUS | Status: AC | PRN
  Administered 2023-01-31 (×2): 250 mL via INTRAVENOUS

## 2023-01-31 MED ORDER — LIDOCAINE HCL (PF) 1 % IJ SOLN
INTRAMUSCULAR | Status: DC | PRN
  Administered 2023-01-31: 2 mL

## 2023-01-31 SURGICAL SUPPLY — 23 items
BALL SAPPHIRE NC24 3.25X10 (BALLOONS) ×1
BALLN ~~LOC~~ EMERGE MR 3.5X12 (BALLOONS) ×1
BALLOON SAPPHIRE NC24 3.25X10 (BALLOONS) IMPLANT
BALLOON ~~LOC~~ EMERGE MR 3.5X12 (BALLOONS) IMPLANT
CATH EXTRAC PRONTO LP 6F RND (CATHETERS) IMPLANT
CATH INFINITI JR4 5F (CATHETERS) IMPLANT
CATH LAUNCHER 6FR EBU3.5 (CATHETERS) IMPLANT
CATH OPTICROSS HD (CATHETERS) IMPLANT
CATH VENTURE RX (CATHETERS) IMPLANT
GLIDESHEATH SLEND A-KIT 6F 22G (SHEATH) IMPLANT
GUIDEWIRE INQWIRE 1.5J.035X260 (WIRE) IMPLANT
INQWIRE 1.5J .035X260CM (WIRE) ×2
KIT ENCORE 26 ADVANTAGE (KITS) IMPLANT
KIT HEMO VALVE WATCHDOG (MISCELLANEOUS) IMPLANT
PACK CARDIAC CATHETERIZATION (CUSTOM PROCEDURE TRAY) ×1 IMPLANT
SET ATX-X65L (MISCELLANEOUS) IMPLANT
SHEATH PROBE COVER 6X72 (BAG) IMPLANT
SLED PULL BACK IVUS (MISCELLANEOUS) IMPLANT
TRANSDUCER W/STOPCOCK (MISCELLANEOUS) ×1 IMPLANT
TUBING CIL FLEX 10 FLL-RA (TUBING) ×1 IMPLANT
WIRE ASAHI PROWATER 180CM (WIRE) IMPLANT
WIRE HI TORQ WHISPER MS 190CM (WIRE) IMPLANT
WIRE RUNTHROUGH .014X180CM (WIRE) IMPLANT

## 2023-01-31 NOTE — Progress Notes (Signed)
Patient was alert and oriented x 4 when day shift nurse, my preceptor Alex,  and I went to the room for shift report, whiles in the room talking to patient, we noted her having PVCs on the wall monitor, so we decided to let attending for the night to be aware of her current condition. As soon as my Teacher, adult education and 1 left the room while the patient was on the phone with a family member, CN rush to the hallway and told me to go to room 6 and check on her, I rushed to the room with other staff members and found the patient on the bed unresponsive while holding her cell phone with one of her leg almost off the bed. That is when code was called.CODE protocol was followed until we transferred the patient to [facility] ICU. All belongings were sent with patient later including her cell phone, family members updated.

## 2023-01-31 NOTE — Consult Note (Signed)
NAME:  Madison West, MRN:  [id number], DOB:  Dec 13, 1955, LOS: 7 ADMISSION DATE:  01/23/2023, CONSULTATION DATE:  01/31/2023  REFERRING MD:  TRH, pokhrel, CHIEF COMPLAINT:  VT arrest   History of Present Illness:  67 yo F w/ pertinent PMH PSVT, HTN, HLD, R carotid artery stenosis s/p endarterectomy 2020, CAD s/p stenting 2021, moderate aortic stenosis presents to [facility] ED on 8/9 w/ SOB.   Patient has been having SOB that has been getting progressively worse. Patient states she has been gaining weight and feels fatigued. Patient also having some anginal chest pain similar to when she needed coronary interventions back in 2021. Patient admitted to [facility] on 8/9 and treated for new onset CHF. Echo showing LVEF 30-35%; regional wall motion abnormalities; moderate aortic stenosis. BNP 462. EKG showing PVC, LAE, LAFB w/ miniaml ST elevation. Patient underwent cardiac cath on 8.7 showing mid LAD 95% lesion w/ LAD stent placement. CT surgery consulted for severe aortic stenosis. Patient has been having episodes of non sustained v tach started on metoprolol.    On 8/11, patient on floors and had vtach w/ no pulse. CPR initiated, given epi, and shocked x2 with ROSC achieved. Patient alert after cpr event but slightly confused. Patient again went into Vtach rhythm and was shocked again. Given amio, mag, calcium. Patient started on amio drip. Patient transferred to [facility] ICU and cardiology consulted. EKG showing ST elevation in anterior leads. Code STEMI. PCCM consulted.  Pertinent  Medical History  PSVT, anxiety/depression,  breast cancer status post lumpectomy, chemo and radiation in 2009, hypertension,  hyperlipidemia,  right carotid artery stenosis status post carotid endarterectomy in 2020, CAD status post stent to LAD 8/7  Significant Hospital Events: Including procedures, antibiotic start and stop dates in addition to other pertinent events     Interim History / Subjective:  Complains of indigestion,  no nausea Chest pain Keeps asking "what happened"  Objective   Blood pressure 121/74, pulse 82, temperature 99 F (37.2 C), temperature source Oral, resp. rate 14, height 5\' 8"  (1.727 m), weight 93.3 kg, SpO2 95%.        Intake/Output Summary (Last 24 hours) at 01/31/2023 2017 Last data filed at 01/31/2023 0900 Gross per 24 hour  Intake 234 ml  Output --  Net 234 ml   Filed Weights   01/29/23 0503 01/30/23 0534 01/31/23 0430  Weight: 93.6 kg 93.8 kg 93.3 kg    Examination: Gen. Pleasant, obese, in no distress, anxious affect ENT - no pallor,icterus, no post nasal drip, class 2 airway Neck: No JVD, no thyromegaly, no carotid bruits Lungs: Tachypneic, no use of accessory muscles, no dullness to percussion, no rales or rhonchi  Cardiovascular: Rhythm regular, heart sounds  normal, no murmurs or gallops, no peripheral edema Abdomen: soft and non-tender, no hepatosplenomegaly, BS normal. Musculoskeletal: No deformities, no cyanosis or clubbing Neuro:  alert, non focal, no tremors, anxious and keeps asking what happened in spite of being told several times   EKG shows ST elevations V1-V3 Rhythm on monitor initially showed runs of VT but then settled down into sinus tachycardia, bigeminy rhythm  Labs show mild leukocytosis, stable hemoglobin  Resolved Hospital Problem list     Assessment & Plan:  VT arrest in the setting of recent LAD stent, EKG showing anterior wall STEMI, likely in-stent restenosis Acute coronary syndrome  -Discussed with cardiology, they will take to Cath Lab -Continue IV Amio -IV heparin -Continue metoprolol -Check electrolytes and troponin for completion   Acute  systolic heart failure -EF 30-35% with akinetic apex and septum on echocardiogram  Severe AS -Plan was for TAVR as outpatient  Acute hypoxic respiratory failure -On nasal cannula -Chest x-ray to rule out pulm edema   Hypertension -on losartan and amlodipine Severe anxiety -on BuSpar  and Xanax   Best Practice (right click and "Reselect all SmartList Selections" daily)   Diet/type: NPO DVT prophylaxis: systemic heparin GI prophylaxis: N/A Lines: N/A Foley:  N/A Code Status:  full code Last date of multidisciplinary goals of care discussion [NA] daughter updated by code team  Labs   CBC: Recent Labs  Lab 01/26/23 0311 01/27/23 1737 01/27/23 2321 01/29/23 0614 01/29/23 1658 01/30/23 0322  WBC 7.8  --  6.3 6.2 6.8 6.9  HGB 13.9 14.6  15.0 14.0 13.9 14.2 13.5  HCT 41.1 43.0  44.0 42.5 40.6 42.0 40.3  MCV 92.4  --  95.1 93.8 93.8 97.1  PLT 362  --  413* 387 377 405*    Basic Metabolic Panel: Recent Labs  Lab 01/26/23 0311 01/27/23 0205 01/27/23 1737 01/27/23 2321 01/28/23 0935 01/29/23 0614 01/29/23 1658 01/30/23 0322 01/31/23 0645  NA 126* 129* 133*  133* 131* 131* 130*  --  132*  --   K 3.9 4.1 4.4  4.5 5.2* 4.2 4.5  --  4.8  --   CL 92* 95*  --  100 98 97*  --  100  --   CO2 24 25  --  20* 25 23  --  24  --   GLUCOSE 117* 108*  --  135* 113* 113*  --  109*  --   BUN 18 17  --  18 16 20   --  22  --   CREATININE 0.84 0.79  --  0.70 0.67 0.81 0.76 0.70 0.73  CALCIUM 8.4* 8.5*  --  9.0 9.0 8.8*  --  8.8*  --   MG 2.3 2.4  --  2.1  --  2.2  --  2.2  --    GFR: Estimated Creatinine Clearance: 79.3 mL/min (by C-G formula based on SCr of 0.73 mg/dL). Recent Labs  Lab 01/27/23 2321 01/29/23 0614 01/29/23 1658 01/30/23 0322  WBC 6.3 6.2 6.8 6.9    Liver Function Tests: No results for input(s): "AST", "ALT", "ALKPHOS", "BILITOT", "PROT", "ALBUMIN" in the last 168 hours. No results for input(s): "LIPASE", "AMYLASE" in the last 168 hours. No results for input(s): "AMMONIA" in the last 168 hours.  ABG    Component Value Date/Time   PHART 7.373 01/27/2023 1737   PCO2ART 43.0 01/27/2023 1737   PO2ART 60 (L) 01/27/2023 1737   HCO3 25.0 01/27/2023 1737   HCO3 26.4 01/27/2023 1737   TCO2 26 01/27/2023 1737   TCO2 28 01/27/2023 1737    O2SAT 90 01/27/2023 1737   O2SAT 64 01/27/2023 1737     Coagulation Profile: No results for input(s): "INR", "PROTIME" in the last 168 hours.  Cardiac Enzymes: No results for input(s): "CKTOTAL", "CKMB", "CKMBINDEX", "TROPONINI" in the last 168 hours.  HbA1C: Hgb A1c MFr Bld  Date/Time Value Ref Range Status  10/15/2017 04:24 PM 5.8 (H) 4.8 - 5.6 % Final    Comment:             Prediabetes: 5.7 - 6.4          Diabetes: >6.4          Glycemic control for adults with diabetes: <7.0   10/06/2011 10:06 AM 5.6 <5.7 % Final  Comment:    (NOTE)                                                                       According to the ADA Clinical Practice Recommendations for 2011, when HbA1c is used as a screening test:  >=6.5%   Diagnostic of Diabetes Mellitus           (if abnormal result is confirmed) 5.7-6.4%   Increased risk of developing Diabetes Mellitus References:Diagnosis and Classification of Diabetes Mellitus,Diabetes Care,2011,34(Suppl 1):S62-S69 and Standards of Medical Care in         Diabetes - 2011,Diabetes Care,2011,34 (Suppl 1):S11-S61.    CBG: Recent Labs  Lab 01/31/23 1942  GLUCAP 121*     Review of Systems:   Constitutional: negative for anorexia, fevers and sweats  Eyes: negative for irritation, redness and visual disturbance  Ears, nose, mouth, throat, and face: negative for earaches, epistaxis, nasal congestion and sore throat  Respiratory: negative for cough,  sputum and wheezing  Cardiovascular: negative for lower extremity edema, orthopnea, palpitations and syncope  Gastrointestinal: negative for abdominal pain, constipation, diarrhea, melena, nausea and vomiting  Genitourinary:negative for dysuria, frequency and hematuria  Hematologic/lymphatic: negative for bleeding, easy bruising and lymphadenopathy  Musculoskeletal:negative for arthralgias, muscle weakness and stiff joints  Neurological: negative for coordination problems, gait problems,  headaches and weakness  Endocrine: negative for diabetic symptoms including polydipsia, polyuria and weight loss   Past Medical History:  She,  has a past medical history of Anxiety, Arthritis, Breast cancer (HCC) (06/02/07), Carpal tunnel syndrome, Colon polyps, Complication of anesthesia, Coronary artery disease, Depression, Dysplasia of vulva, Hypertension, Palpitations, S/P breast lumpectomy (07/04/07), and S/P radiation therapy (2009).   Surgical History:   Past Surgical History:  Procedure Laterality Date   APPENDECTOMY     age 54   CESAREAN SECTION     x 2   COLONOSCOPY W/ POLYPECTOMY     COLONOSCOPY WITH PROPOFOL N/A 04/27/2016   Procedure: COLONOSCOPY WITH PROPOFOL;  Surgeon: Charolett Bumpers, MD;  Location: [facility] ENDOSCOPY;  Service: Endoscopy;  Laterality: N/A;   DILATION AND CURETTAGE OF UTERUS     multiple   ENDARTERECTOMY Right 06/09/2019   ENDARTERECTOMY Right 06/09/2019   Procedure: ENDARTERECTOMY CAROTID RIGHT;  Surgeon: Sherren Kerns, MD;  Location: [facility] OR;  Service: Vascular;  Laterality: Right;   INTRAUTERINE DEVICE INSERTION     IUD REMOVAL     LEFT HEART CATH AND CORONARY ANGIOGRAPHY N/A 02/27/2020   Procedure: LEFT HEART CATH AND CORONARY ANGIOGRAPHY;  Surgeon: Yates Decamp, MD;  Location: [facility] INVASIVE CV LAB;  Service: Cardiovascular;  Laterality: N/A;   MASTECTOMY PARTIAL / LUMPECTOMY W/ AXILLARY LYMPHADENECTOMY Right    lumpectomy and lymph nodes removed   PATCH ANGIOPLASTY Right 06/09/2019   Procedure: Patch Angioplasty of right carotid artery using hemashield paltinum finesse patch;  Surgeon: Sherren Kerns, MD;  Location: [facility] OR;  Service: Vascular;  Laterality: Right;   RIGHT/LEFT HEART CATH AND CORONARY ANGIOGRAPHY N/A 01/27/2023   Procedure: RIGHT/LEFT HEART CATH AND CORONARY ANGIOGRAPHY;  Surgeon: Elder Negus, MD;  Location: [facility] INVASIVE CV LAB;  Service: Cardiovascular;  Laterality: N/A;   TUBAL LIGATION     VULVA SURGERY     Multiple  times for  dysplasia     Social History:   reports that she quit smoking about 25 years ago. Her smoking use included cigarettes. She started smoking about 45 years ago. She has a 15 pack-year smoking history. She has never used smokeless tobacco. She reports current alcohol use. She reports that she does not use drugs.   Family History:  Her family history includes Colon cancer (age of onset: 23) in her mother; Colon polyps in her brother; Diabetes type II in her brother; Heart disease in her father and paternal grandfather.   Allergies Allergies  Allergen Reactions   Prednisone Palpitations   Fentanyl Swelling and Other (See Comments)    Makes pt "hyper" feels she is "climbing the walls."  Last time she had a colonoscopy with Fentanyl and Versed she was awake the whole time.   Versed [Midazolam] Swelling and Anxiety    Makes pt "hyper" feels she is "climbing the walls."  Last time she had a colonoscopy with Fentanyl and Versed she was awake the whole time.   Vicodin [Hydrocodone-Acetaminophen] Anxiety    Extreme anxiety.  OK to take oxycodone.     Home Medications  Prior to Admission medications   Medication Sig Start Date End Date Taking? Authorizing Provider  acetaminophen (TYLENOL) 500 MG tablet Take 1,000 mg by mouth every 6 (six) hours as needed for fever or mild pain.   Yes [provider]  alprazolam Prudy Feeler) 2 MG tablet Take 0.25-2 mg by mouth See admin instructions. 1 mg (may take up to 2 mg) twice daily. May also take 0.25 mg during the day if needed for anxiety.   Yes [provider]  Ascorbic Acid (VITAMIN C PO) Take 2 tablets by mouth daily.   Yes [provider]  aspirin 81 MG chewable tablet Chew 81 mg by mouth daily.    Yes [provider]  Cholecalciferol (VITAMIN D3 PO) Take 1 capsule by mouth daily.   Yes [provider]  clopidogrel (PLAVIX) 75 MG tablet Take 1 tablet (75 mg total) by mouth daily. 01/29/23 01/29/24 Yes Patwardhan,  Manish J, MD  Coenzyme Q10 (CO Q 10 PO) Take 1 capsule by mouth daily.   Yes [provider]  GARLIC PO Take 1 capsule by mouth daily.   Yes [provider]  losartan-hydrochlorothiazide (HYZAAR) 50-12.5 MG tablet Take 1 tablet by mouth daily. Patient taking differently: Take 0.75 tablets by mouth daily. 09/30/22  Yes Yates Decamp, MD  Multiple Vitamins-Minerals (ZINC PO) Take 1 tablet by mouth daily.   Yes [provider]  nitroGLYCERIN (NITROSTAT) 0.4 MG SL tablet Place 1 tablet (0.4 mg total) under the tongue every 5 (five) minutes as needed for chest pain. 08/04/22  Yes Yates Decamp, MD  Omega-3 Fatty Acids (FISH OIL PO) Take 1 capsule by mouth daily.   Yes [provider]  TURMERIC CURCUMIN PO Take 1 tablet by mouth daily.   Yes [provider]     Critical care time: 65m     Cyril Mourning MD. FCCP. [facility] Pager : [phone]  If no response to pager , please call [phone] until 7 pm After 7:00 pm call Elink  [phone]   01/31/2023

## 2023-01-31 NOTE — Plan of Care (Signed)

## 2023-01-31 NOTE — Progress Notes (Signed)
   01/31/23 2124  Spiritual Encounters  Type of Visit Attempt (pt unavailable)  Care provided to: [facility] partners present during encounter Nurse  Referral source Chaplain team  Reason for visit Code  OnCall Visit Yes   Followed up family was present in the 2H waiting area, 2 daughters, 2 sons-in-law, 1 female child and one dog named Roscoe. Patient was being taken to the CathLab. Family chose to wait in waiting area until they heard something. Offered support. They inquired about food, directed them to [employer] as the cafe was closed for the day.

## 2023-01-31 NOTE — Progress Notes (Signed)
   01/31/23 2054  Spiritual Encounters  Type of Visit Initial  Care provided to: Family  Referral source Code page  Reason for visit Code  OnCall Visit Yes  Spiritual Framework  Presenting Themes Impactful experiences and emotions  Interventions  Spiritual Care Interventions Made Established relationship of care and support;Compassionate presence;Encouragement   Chaplain responded to code blue. Patient survived. Chaplain and internal medicine MD ran into the patient's daughter Baird Lyons as she arrived on 43E. Chaplain and MD escorted the patient's daughter to [facility] waiting area. Chaplain stayed with patient's daughter and escorted her boyfriend and her other sister to [facility] waiting area. Chaplain Tiburcio Pea has asked the oncoming chaplain to check in on the patient's family.   Arlyce Dice, Chaplain Resident [phone]

## 2023-01-31 NOTE — Interval H&P Note (Signed)
History and Physical Interval Note:  01/31/2023 8:53 PM  Madison West  has presented today for surgery, with the diagnosis of STEMI.  The various methods of treatment have been discussed with the patient and family. After consideration of risks, benefits and other options for treatment, the patient has consented to  Procedure(s): Coronary/Graft Acute MI Revascularization (N/A) LEFT HEART CATH AND CORONARY ANGIOGRAPHY (N/A) as a surgical intervention.  The patient's history has been reviewed, patient examined, no change in status, stable for surgery.  I have reviewed the patient's chart and labs.  Questions were answered to the patient's satisfaction.       J 

## 2023-01-31 NOTE — Progress Notes (Addendum)
67 year old Caucasian female with hypertension, hyperlipidemia, CAD, h/o right carotid endarterectomy, h/o breast cancer, HFrEF, moderate to severe AS, s/p mid LAD PCI 01/29/2023, with VT arrest, ROSC with multiple shocks, 2 min CPR. EKG showed anterolateral ST elevation. Patient awake, alert, complains of chest pain. Hemodynamically stable BP 121/91 mmHg, HR 102. Concern for acute stent thrombosis. Recommend urgent coronary angiography and intervention. Patient's daughters Toni Amend and Baird Lyons also present at bedside. All verbalized understanding.    Elder Negus, MD Pager: [phone] Office: [phone]

## 2023-01-31 NOTE — Progress Notes (Signed)
eLink Physician-Brief Progress Note Patient Name: Madison West DOB: 24-Apr-1956 MRN: [id number]   Date of Service  01/31/2023  HPI/Events of Note  55F with HTN, HLD, CAD, hx right carotid endarterectomy, HFrEF, AS who presented with SOB on 8/9. Treated for new CHF EF 30-35% and underwent cardiac cath on 8/9 with DES to mLAD. In hospital NSVTs started on metoprolol. On 8/11 pulseless VT arrest > shocked x 2 with ROSC and VT with shock again. Code STEMI called with EKG ST elevation.  Went to the cath lab and found 100% stent thrombosis with no flow beyond proximal LAD. Aspiration thrombectomy.  Off levophed. Patient AAOx4. No current ICU needs  eICU Interventions  Tele Amio Heparin Brilinta          Mechele Collin 01/31/2023, 9:18 PM

## 2023-01-31 NOTE — Progress Notes (Signed)
  Patient Name: Madison West   MRN: [id number]   Date of Birth/ Sex: [birth year] , female      Admission Date: 01/23/2023  Attending Provider: Joycelyn Das, MD  Primary Diagnosis: Acute CHF (congestive heart failure) (HCC)   Indication: Pt was in her usual state of health until this PM, when she was noted to have Vtach arrest . Code blue was subsequently called. At the time of arrival on scene, ACLS protocol was underway.   Technical Description:  - CPR performance duration:  2 minutes  - Was defibrillation or cardioversion used? Yes   - Was external pacer placed? No  - Was patient intubated pre/post CPR? No   Medications Administered: Y = Yes; Blank = No Amiodarone  Y  Atropine    Calcium  Y  Epinephrine  Y  Lidocaine    Magnesium  Y  Norepinephrine    Phenylephrine    Sodium bicarbonate    Vasopressin     Post CPR evaluation:  - Final Status - Was patient successfully resuscitated ? Yes - What is current rhythm? Sinus Tachycardia with premature beats, ST elevation in V1-V4 - What is current hemodynamic status? HDS  Miscellaneous Information:  - Labs sent, including: CBC, BMP, Lactic Acid, Troponin  - Primary team notified?  Yes  - Family Notified? Yes  - Additional notes/ transfer status: Transferred to 2H and Cardiology notified.      Gwenevere Abbot, MD  01/31/2023, 8:20 PM

## 2023-01-31 NOTE — Progress Notes (Addendum)
Subjective:  No chest pain/retrosternal burning. Ambulated without chest pain  Episodes of sinus tachycardia at rest and exertion, and brief episodes of NSVT   Current Facility-Administered Medications:    0.9 %  sodium chloride infusion, 250 mL, Intravenous, PRN, Norins, Rosalyn Gess, MD   0.9 %  sodium chloride infusion, , Intravenous, Continuous, ,  J, MD, Stopped at 01/29/23 1219   0.9 %  sodium chloride infusion, 250 mL, Intravenous, PRN, ,  J, MD   acetaminophen (TYLENOL) tablet 650 mg, 650 mg, Oral, Q4H PRN, ,  J, MD   ALPRAZolam Prudy Feeler) tablet 1 mg, 1 mg, Oral, TID, John Giovanni, MD, 1 mg at 01/31/23 0847   alum & mag hydroxide-simeth (MAALOX/MYLANTA) 200-200-20 MG/5ML suspension 30 mL, 30 mL, Oral, Q6H PRN, Pokhrel, Laxman, MD, 30 mL at 01/29/23 1645   aspirin chewable tablet 81 mg, 81 mg, Oral, Daily, Norins, Rosalyn Gess, MD, 81 mg at 01/31/23 0848   busPIRone (BUSPAR) tablet 5 mg, 5 mg, Oral, BID, Norins, Rosalyn Gess, MD, 5 mg at 01/31/23 0847   butalbital-acetaminophen-caffeine (FIORICET) 50-325-40 MG per tablet 1 tablet, 1 tablet, Oral, Once, Pokhrel, Laxman, MD   calcium carbonate (TUMS - dosed in mg elemental calcium) chewable tablet 200 mg of elemental calcium, 1 tablet, Oral, TID PRN, Tolia, Sunit, DO   clopidogrel (PLAVIX) tablet 75 mg, 75 mg, Oral, Q breakfast, ,  J, MD, 75 mg at 01/31/23 0847   enoxaparin (LOVENOX) injection 40 mg, 40 mg, Subcutaneous, Q24H, Norins, Rosalyn Gess, MD, 40 mg at 01/31/23 0848   furosemide (LASIX) injection 40 mg, 40 mg, Intravenous, Daily, Pokhrel, Laxman, MD, 40 mg at 01/31/23 0847   losartan (COZAAR) tablet 25 mg, 25 mg, Oral, Daily, Norins, Rosalyn Gess, MD, 25 mg at 01/31/23 0847   metoprolol succinate (TOPROL-XL) 24 hr tablet 25 mg, 25 mg, Oral, Daily, ,  J, MD   nitroGLYCERIN (NITROSTAT) SL tablet 0.4 mg, 0.4 mg, Sublingual, Q5 min PRN, Norins, Rosalyn Gess, MD    omega-3 acid ethyl esters (LOVAZA) capsule 1 g, 1 g, Oral, Daily, Norins, Rosalyn Gess, MD, 1 g at 01/31/23 0847   ondansetron (ZOFRAN) injection 4 mg, 4 mg, Intravenous, Q6H PRN, ,  J, MD   Oral care mouth rinse, 15 mL, Mouth Rinse, PRN, Norins, Rosalyn Gess, MD   pantoprazole (PROTONIX) EC tablet 40 mg, 40 mg, Oral, Daily, Norins, Rosalyn Gess, MD, 40 mg at 01/31/23 0848   potassium chloride SA (KLOR-CON M) CR tablet 20 mEq, 20 mEq, Oral, Daily, Pokhrel, Laxman, MD, 20 mEq at 01/31/23 0848   rosuvastatin (CRESTOR) tablet 20 mg, 20 mg, Oral, Daily, Norins, Rosalyn Gess, MD   sodium chloride flush (NS) 0.9 % injection 3 mL, 3 mL, Intravenous, Q12H, Norins, Rosalyn Gess, MD, 3 mL at 01/30/23 2203   sodium chloride flush (NS) 0.9 % injection 3 mL, 3 mL, Intravenous, PRN, Norins, Rosalyn Gess, MD   sodium chloride flush (NS) 0.9 % injection 3 mL, 3 mL, Intravenous, Q12H, ,  J, MD, 3 mL at 01/31/23 0850   sodium chloride flush (NS) 0.9 % injection 3 mL, 3 mL, Intravenous, PRN, ,  J, MD   spironolactone (ALDACTONE) tablet 25 mg, 25 mg, Oral, Daily, Tolia, Sunit, DO, 25 mg at 01/31/23 0848   Objective:  Vital Signs in the last 24 hours: Temp:  [98 F (36.7 C)-98.2 F (36.8 C)] 98.2 F (36.8 C) (08/11 0430) Pulse Rate:  [81-86] 81 (08/11 0430) Resp:  [15-16] 16 (08/11 0430) BP: (121-127)/(77-91)  127/77 (08/11 0430) SpO2:  [96 %-99 %] 99 % (08/11 0430) Weight:  [93.3 kg] 93.3 kg (08/11 0430)  Intake/Output from previous day: No intake/output data recorded.  Physical Exam Vitals and nursing note reviewed.  Constitutional:      General: She is not in acute distress. Neck:     Vascular: No JVD.  Cardiovascular:     Rate and Rhythm: Normal rate and regular rhythm.     Heart sounds: Murmur heard.     Harsh midsystolic murmur is present with a grade of 3/6 at the upper right sternal border radiating to the neck.  Pulmonary:     Effort: Pulmonary effort is  normal.     Breath sounds: Normal breath sounds. No wheezing or rales.      Imaging/tests reviewed and independently interpreted:  Coronary intervention 01/29/2023: (Please see diagnostic coronary angiogram report 01/27/2023) Mid LAD 95% stenosis Successful percutaneous coronary intervention mid LAD        PTCA and stent placement 3.0 X 22 mm Onyx Frontier drug-eluting stent        Post dilatation using 3.25 X 12 mm [place] balloon up tp 18 atm 0% residual stenosis, TIMI flow III          CTA chest 01/28/2023: 1. Vascular findings and measurements pertinent to potential TAVR procedure, as detailed above. 2. Thickening and calcification of the aortic valve, compatible with reported clinical history of aortic stenosis. 3. Moderate to severe aortoiliac atherosclerosis. Coronary artery calcifications of the LAD and RCA.    Cardiac Studies:  Telemetry 01/31/2023: Episodes of sinus tachycardia at rest and exertion, and brief episodes of NSVT   LHC/RHC 01/27/2023: LM: Normal LAD: 95% mid LAD stenosis with mild calcification and moderate tortuosity (New since 2021)          40% downstream lesion in mid LAD (New since 2021)          Otherwise mild diffuse disease Lcx: Mid diffuse 50% disease (Previously deemed 80% in 2021) Ramus: Patent stent. No restenosis RCA: Mid eccentric 40% disease. Rest mild diffuse disease   RA: 4 mmHg RV: 31/2 mmHg PA: 23/10 mmHg, mPAP 14 mmHg PCW: 4 mmHg   LV: 132/0 mmHg Ao: 101/59 mmHg CO: 5.5 L/min CI: 2.7 L/min/m2   AoV mean PG 29 mmHg AVA 1.0 cm2, AVAi 0.5 cm2/.m2   Exertional angina and dyspnea Single vessel obstructive disease  Compensated ischemic or valvular cardiomyopathy (EF 30-35% with akinetic apex and septum on echocardiogram) Probable low flow low gradient severe AS GDMT for HFrEF likely limited by low blood pressure in the setting of severe AS   Will discuss with multidisciplinary heart team re: GDMT for HFrEF + PCI and TAVR vs CABG  (LIMA-LAD) + AVR   EKG 01/23/2023: Sinus tachycardia Ventricular premature complex LAE, consider biatrial enlargement Left anterior fascicular block Abnormal R-wave progression, early transition Borderline repolarization abnormality Confirmed by Virgina Norfolk [phone]) on 8/  Echocardiogram 01/25/2023:  1. Left ventricular ejection fraction, by estimation, is 30 to 35%. The  left ventricle has moderately decreased function. The left ventricle  demonstrates regional wall motion abnormalities (see scoring  diagram/findings for description). There is mild  left ventricular hypertrophy. Indeterminate diastolic filling due to E-A  fusion. The entire septum and apex are hypokinetic.   2. Right ventricular systolic function is low normal. The right  ventricular size is normal.   3. The mitral valve is grossly normal. No evidence of mitral valve  regurgitation.   4. Native valve,  moderate to severe calcification, reduced leaflet  excursion, trace aortic regurgitation, mild to moderate aortic stenosis  (peak velocity 2.12m/s, Peak Gradient 33 mmHg, Mean gradient 20 mmHg, AVA  VTI 0.93cm2, DI 0.33, SVi 25cc (severity   of aortic stenosis is likely underestimated due to low flow - low  gradient AS).   5. The inferior vena cava is normal in size with greater than 50%  respiratory variability, suggesting right atrial pressure of 3 mmHg.   6. Rhythm strip during this exam demonstrates normal sinus rhythm.   Comparison(s): Prior study 05/25/2022: LVEF 55-60%, Grade I diastolic  dysfunction, mild to moderate AS, see report for more details.      Assessment & Recommendations:  67 year old Caucasian female with hypertension, hyperlipidemia, CAD, h/o right carotid endarterectomy, h/o breast cancer, with new diagnosis of HFrEF, moderate aortic stenosis  HFrEF: Likely combination of valvular and ischemic cardiomyopathy. She remains very reluctant to add more medications, something that I would  recommend for GFMT for HFrEF. At the very least, continue spironolactone and losartan.  She is willing to add metoprolol succinate 25 mg daily, but does not want to try any "new medications".  Will start this today.  Aortic stenosis: Very likely low-flow low gradient severe restenosis.  Small possibility remains that if and when her EF were to improve, that her aortic stenosis area could increase, if this is indeed pseudo stenosis.  TAVR team is involved, with potential plan for TAVR in the near future outpatient.  Patient remains very upset that TAVR will not be performed inpatient.  I have explained to her to best of my ability, that this is not an emergent procedure, and most of the tablets are performed outpatient.  Continue treatment of heart failure and CAD remains key at this time, until TAVR can be performed.  CAD: S/p mid LAD PCI Recommend Aspirin and plavix for at least 6 months, preferably 1 year. She did not get metoprolol succinate 25 yesterday as I missed out on ordering it. Added metoprolol succinate 25 mg daily today. Currently on Crestor 20 mg daily (although it appears that patient is refusing it). Will need to consider adding Repatha/Leqvio outpatient.  TAVR will be outpatient. I think we can watch her for one more day to assess medication tolerance and discharge tomorrow.  Will arrange outpatient follow up with Dr. Neila Gear f/u w/Dr. Excell Seltzer for TAVR consult on 8/26.  Discussed interpretation of tests and management recommendations with the primary team   Elder Negus, MD Pager: [phone] Office: [phone]

## 2023-01-31 NOTE — Progress Notes (Signed)
PROGRESS NOTE    Onesti Conard  WUJ:[id number] DOB: [birth year] DOA: 01/23/2023 PCP: [name], MD    Brief Narrative:   Madison West, is a 67 years old female with  past  medical  history of PSVT, anxiety/depression, breast cancer status post lumpectomy, chemo and radiation in 2009, hypertension, hyperlipidemia, right carotid artery stenosis status post carotid endarterectomy in 2020, CAD status post stenting, mild to moderate aortic stenosis, GERD presented to the ED with complaints of shortness of breath, weight gain, and generalized weakness.  Patient was slightly tachycardic on arrival but afebrile.  In the ED, patient was hypoxic with pulse ox of 90s on room air.  Labs significant for sodium 128, chloride 91, creatinine 0.7, BNP 462, troponin 35> 38, COVID/influenza/RSV PCR negative.  Patient has been non-adherent to medical recommendations.  Initial labs showed hyponatremia with sodium of 128, BNP 462, Troponin  35 to 38. CTA chest - no PE, bronchial wall thickening, emphysema, CAD, no pulmonary edema. CXR  did not see any infiltrate., EKG showed PVC, LAE, LAFB minimal ST elevation.  Patient was then admitted to the hospital for further evaluation and treatment.  Assessment and Plan:  Acute systolic CHF History of CAD with stents. Severe aortic stenosis. CT angiogram of chest showed no evidence of pulmonary embolism but mild bronchial wall thickening.  Previous stress test from 08/18/2022 with LV ejection fraction of 55%.   2D echocardiogram this admission with LV ejection fraction of 30 to 35% with regional wall motion abnormality.  Cardiology on board and patient underwent cardiac catheterization on 01/27/2023 with findings of a mid LAD 95% lesion. Cardiothoracic surgery was consulted due to  severe aortic stenosis and possible need for CABG versus PCI.  At this time, patient has undergone the repeat cardiac catheterization with LAD stent placement.  Plan for TAVR as outpatient.  Status  post CT angiogram of the chest, CT coronaries and CT angio of the abdomen and pelvis.   Had episodes of sinus tachycardia at rest and exertion with the brace for nonsustained V. tach.  Has been started on metoprolol since today.  Cardiology recommends observation for 1 more day to see regarding metoprolol intolerance. Will need to follow-up with cardiology and cardiovascular surgery as outpatient.  Anxiety disorder Extremely anxious.  On BuSpar and Xanax.  Xanax temporarily.  Follows up with psychiatry Dr. Jennette Kettle group as outpatient.   Pure hypercholesterolemia On Crestor.  Hypokalemia.  Follow-up  Mild hyponatremia  mild we will continue to monitor  Hypertension Patient is on losartan/Hct , continue losartan and amlodipine.  Discontinued HCTZ.   DVT prophylaxis: SCD's Start: 01/29/23 1638 SCD's Start: 01/27/23 1832 enoxaparin (LOVENOX) injection 40 mg Start: 01/24/23 1200   Code Status:     Code Status: Full Code  Disposition:  Home likely on 01/31/2023.  Status is: Inpatient  Remains inpatient appropriate because:  heart failure, anxiety, status post cardiac catheterization with LAD stenosis, status post cardiac stent placement,    Family Communication:  I spoke with the patient's daughter on the phone on 01/27/2023.  Consultants:  Cardiology  Procedures:  Cardiac catheterization 01/27/2023. Cardiac catheterization with PCI of LAD on 01/29/2023  Antimicrobials:  None  Anti-infectives (From admission, onward)    None      Subjective:  Today, patient was seen and examined at bedside.  Denies any nausea vomiting fever chills or rigor but is concerned about tachycardia.  Seen by cardiology and has been started on metoprolol today.  Feels extremely anxious and is  scared about going home.  Objective: Vitals:   01/30/23 0534 01/30/23 0856 01/30/23 1936 01/31/23 0430  BP: 128/87 (!) 142/93 (!) 121/91 127/77  Pulse: 88 95 86 81  Resp: 20 16 15 16   Temp: 97.7 F (36.5 C)  98.5 F (36.9 C) 98 F (36.7 C) 98.2 F (36.8 C)  TempSrc: Oral Oral Oral Oral  SpO2: 94%  96% 99%  Weight: 93.8 kg   93.3 kg  Height:        Intake/Output Summary (Last 24 hours) at 01/31/2023 1440 Last data filed at 01/31/2023 0900 Gross per 24 hour  Intake 234 ml  Output --  Net 234 ml   Filed Weights   01/29/23 0503 01/30/23 0534 01/31/23 0430  Weight: 93.6 kg 93.8 kg 93.3 kg    Physical Examination: Body mass index is 31.26 kg/m.   General: Obese built, not in obvious distress on nasal cannula oxygen, anxious HENT:   No scleral pallor or icterus noted. Oral mucosa is moist.  Chest: Diminished breath sounds bilaterally.  Clear breath sounds.   CVS: S1 &S2 heard.  Systolic murmur mild tachycardia. Abdomen: Soft, nontender, nondistended.  Bowel sounds are heard.   Extremities: No cyanosis, clubbing ,peripheral pulses are palpable. Psych: Alert, awake and oriented, appears anxious. CNS:  No cranial nerve deficits.  Power equal in all extremities.   Skin: Warm and dry.  No rashes noted.  Data Reviewed:   CBC: Recent Labs  Lab 01/26/23 0311 01/27/23 1737 01/27/23 2321 01/29/23 0614 01/29/23 1658 01/30/23 0322  WBC 7.8  --  6.3 6.2 6.8 6.9  HGB 13.9 14.6  15.0 14.0 13.9 14.2 13.5  HCT 41.1 43.0  44.0 42.5 40.6 42.0 40.3  MCV 92.4  --  95.1 93.8 93.8 97.1  PLT 362  --  413* 387 377 405*    Basic Metabolic Panel: Recent Labs  Lab 01/26/23 0311 01/27/23 0205 01/27/23 1737 01/27/23 2321 01/28/23 0935 01/29/23 0614 01/29/23 1658 01/30/23 0322 01/31/23 0645  NA 126* 129* 133*  133* 131* 131* 130*  --  132*  --   K 3.9 4.1 4.4  4.5 5.2* 4.2 4.5  --  4.8  --   CL 92* 95*  --  100 98 97*  --  100  --   CO2 24 25  --  20* 25 23  --  24  --   GLUCOSE 117* 108*  --  135* 113* 113*  --  109*  --   BUN 18 17  --  18 16 20   --  22  --   CREATININE 0.84 0.79  --  0.70 0.67 0.81 0.76 0.70 0.73  CALCIUM 8.4* 8.5*  --  9.0 9.0 8.8*  --  8.8*  --   MG 2.3 2.4  --   2.1  --  2.2  --  2.2  --     Liver Function Tests: No results for input(s): "AST", "ALT", "ALKPHOS", "BILITOT", "PROT", "ALBUMIN" in the last 168 hours.    Radiology Studies: CARDIAC CATHETERIZATION  Addendum Date: 01/30/2023   Coronary intervention 01/29/2023: (Please see diagnostic coronary angiogram report 01/27/2023) Mid LAD 95% stenosis Successful percutaneous coronary intervention mid LAD        PTCA and stent placement 3.0 X 22 mm Onyx Frontier drug-eluting stent        Post dilatation using 3.25 X 12 mm [place] balloon up tp 18 atm 0% residual stenosis, TIMI flow III Elder Negus, MD Pager: [phone] Office: [phone]  Result Date: 01/30/2023 Images from the original result were not included. Coronary intervention 01/29/2023: (Please see diagnostic coronary angiogram report 01/27/2023) Mid LAD 95% stenosis Successful percutaneous coronary intervention mid LAD        PTCA and stent placement 3.0 X 22 mm Onyx Frontier drug-eluting stent        Post dilatation using 3.25 X 12 mm [place] balloon up tp 18 atm 0% residual stenosis, TIMI flow III Elder Negus, MD Pager: [phone] Office: [phone]     LOS: 7 days    Joycelyn Das, MD [facility] Available via Epic secure chat 7am-7pm After these hours, please refer to coverage provider listed on [email or web address] 01/31/2023, 2:40 PM

## 2023-01-31 NOTE — H&P (View-Only) (Signed)
67 year old Caucasian female with hypertension, hyperlipidemia, CAD, h/o right carotid endarterectomy, h/o breast cancer, HFrEF, moderate to severe AS, s/p mid LAD PCI 01/29/2023, with VT arrest, ROSC with multiple shocks, did not require CPR. EKG showed anterolateral ST elevation. Patient awake, alert, complains of chest pain. Hemodynamically stable BP 121/91 mmHg, HR 102. Concern for acute stent thrombosis. Recommend urgent coronary angiography and intervention. Patient's daughters Toni Amend and Baird Lyons also present at bedside. All verbalized understanding.    Elder Negus, MD Pager: [phone] Office: [phone]

## 2023-02-01 ENCOUNTER — Inpatient Hospital Stay (HOSPITAL_COMMUNITY): Payer: Medicare Other

## 2023-02-01 ENCOUNTER — Other Ambulatory Visit (HOSPITAL_COMMUNITY): Payer: Self-pay

## 2023-02-01 ENCOUNTER — Other Ambulatory Visit (HOSPITAL_COMMUNITY): Payer: Medicare Other

## 2023-02-01 ENCOUNTER — Encounter (HOSPITAL_COMMUNITY)
Admission: EM | Disposition: A | Discharge status: Rehabilitation Facility | Payer: Self-pay | Source: Home / Self Care | Attending: Pulmonary Disease

## 2023-02-01 ENCOUNTER — Encounter (HOSPITAL_COMMUNITY): Payer: Self-pay | Admitting: Cardiology

## 2023-02-01 ENCOUNTER — Other Ambulatory Visit: Payer: Self-pay

## 2023-02-01 DIAGNOSIS — I5021 Acute systolic (congestive) heart failure: Secondary | ICD-10-CM | POA: Diagnosis not present

## 2023-02-01 DIAGNOSIS — I469 Cardiac arrest, cause unspecified: Secondary | ICD-10-CM

## 2023-02-01 HISTORY — PX: RIGHT AND LEFT HEART CATH: CATH118262

## 2023-02-01 LAB — MRSA NEXT GEN BY PCR, NASAL: MRSA by PCR Next Gen: NOT DETECTED

## 2023-02-01 LAB — BASIC METABOLIC PANEL
Anion gap: 9 (ref 5–15)
BUN: 18 mg/dL (ref 8–23)
CO2: 23 mmol/L (ref 22–32)
Calcium: 8.8 mg/dL — ABNORMAL LOW (ref 8.9–10.3)
Chloride: 98 mmol/L (ref 98–111)
Creatinine, Ser: 0.68 mg/dL (ref 0.44–1.00)
GFR, Estimated: >60 mL/min (ref >60)
Glucose, Bld: 151 mg/dL — ABNORMAL HIGH (ref 70–99)
Potassium: 4.2 mmol/L (ref 3.5–5.1)
Sodium: 130 mmol/L — ABNORMAL LOW (ref 135–145)

## 2023-02-01 LAB — ECHOCARDIOGRAM COMPLETE
AR max vel: 1.07 cm2
AV Area VTI: 1.1 cm2
AV Area mean vel: 0.98 cm2
AV Mean grad: 24.5 mmHg
AV Peak grad: 35 mmHg
Ao pk vel: 2.96 m/s
Area-P 1/2: 4.33 cm2
Height: 68 in
S' Lateral: 5.2 cm
Weight: 3283.97 [oz_av]

## 2023-02-01 LAB — MAGNESIUM
Magnesium: 2.2 mg/dL (ref 1.7–2.4)
Magnesium: 1.8 mg/dL (ref 1.7–2.4)

## 2023-02-01 LAB — LACTIC ACID, PLASMA
Lactic Acid, Venous: 2.3 mmol/L (ref 0.5–1.9)
Lactic Acid, Venous: 1.4 mmol/L (ref 0.5–1.9)
Lactic Acid, Venous: 1.3 mmol/L (ref 0.5–1.9)

## 2023-02-01 LAB — GLUCOSE, CAPILLARY
Glucose-Capillary: 149 mg/dL — ABNORMAL HIGH (ref 70–99)
Glucose-Capillary: 119 mg/dL — ABNORMAL HIGH (ref 70–99)
Glucose-Capillary: 134 mg/dL — ABNORMAL HIGH (ref 70–99)
Glucose-Capillary: 112 mg/dL — ABNORMAL HIGH (ref 70–99)
Glucose-Capillary: 140 mg/dL — ABNORMAL HIGH (ref 70–99)

## 2023-02-01 LAB — HEMOGLOBIN A1C
Hgb A1c MFr Bld: 5.6 % (ref 4.8–5.6)
Mean Plasma Glucose: 114 mg/dL

## 2023-02-01 LAB — CBC
HCT: 38.5 % (ref 36.0–46.0)
Hemoglobin: 13 g/dL (ref 12.0–15.0)
MCH: 32.6 pg (ref 26.0–34.0)
MCHC: 33.8 g/dL (ref 30.0–36.0)
MCV: 96.5 fL (ref 80.0–100.0)
Platelets: 442 10*3/uL — ABNORMAL HIGH (ref 150–400)
RBC: 3.99 MIL/uL (ref 3.87–5.11)
RDW: 13.1 % (ref 11.5–15.5)
WBC: 12.8 10*3/uL — ABNORMAL HIGH (ref 4.0–10.5)
nRBC: 0 % (ref 0.0–0.2)

## 2023-02-01 LAB — POTASSIUM: Potassium: 4.6 mmol/L (ref 3.5–5.1)

## 2023-02-01 SURGERY — RIGHT AND LEFT HEART CATH

## 2023-02-01 MED ORDER — LIDOCAINE 5 % EX PTCH
1.0000 | MEDICATED_PATCH | CUTANEOUS | Status: DC
  Administered 2023-02-02 – 2023-02-06 (×3): 1 via TRANSDERMAL
  Filled 2023-02-01 (×5): qty 1

## 2023-02-01 MED ORDER — ACETAMINOPHEN 325 MG PO TABS
650.0000 mg | ORAL_TABLET | ORAL | Status: DC | PRN
  Administered 2023-02-01: 650 mg via ORAL
  Administered 2023-02-01: 325 mg via ORAL
  Administered 2023-02-02: 650 mg via ORAL
  Filled 2023-02-01 (×3): qty 2

## 2023-02-01 MED ORDER — MORPHINE SULFATE (PF) 2 MG/ML IV SOLN
1.0000 mg | INTRAVENOUS | Status: DC | PRN
  Administered 2023-02-01: 1 mg via INTRAVENOUS
  Filled 2023-02-01 (×2): qty 1

## 2023-02-01 MED ORDER — SODIUM CHLORIDE 0.9 % IV SOLN: INTRAVENOUS | Status: AC

## 2023-02-01 MED ORDER — ALPRAZOLAM 0.5 MG PO TABS
1.0000 mg | ORAL_TABLET | Freq: Four times a day (QID) | ORAL | Status: DC
  Administered 2023-02-01 – 2023-02-03 (×5): 1 mg via ORAL
  Filled 2023-02-01 (×5): qty 2

## 2023-02-01 MED ORDER — AMIODARONE LOAD VIA INFUSION
150.0000 mg | Freq: Once | INTRAVENOUS | Status: AC
  Administered 2023-02-01: 150 mg via INTRAVENOUS

## 2023-02-01 MED ORDER — CHLORHEXIDINE GLUCONATE CLOTH 2 % EX PADS
6.0000 | MEDICATED_PAD | Freq: Every day | CUTANEOUS | Status: DC
  Administered 2023-02-03 – 2023-02-18 (×17): 6 via TOPICAL

## 2023-02-01 MED ORDER — SODIUM CHLORIDE 0.9 % IV SOLN: 250.0000 mL | INTRAVENOUS | Status: DC | PRN

## 2023-02-01 MED ORDER — ONDANSETRON HCL 4 MG/2ML IJ SOLN: 4.0000 mg | Freq: Four times a day (QID) | INTRAMUSCULAR | Status: DC | PRN

## 2023-02-01 MED ORDER — SODIUM CHLORIDE 0.9% FLUSH
3.0000 mL | Freq: Two times a day (BID) | INTRAVENOUS | Status: DC
  Administered 2023-02-01 – 2023-02-03 (×4): 3 mL via INTRAVENOUS

## 2023-02-01 MED ORDER — OXYCODONE HCL 5 MG PO TABS
5.0000 mg | ORAL_TABLET | ORAL | Status: DC | PRN
  Administered 2023-02-01 – 2023-02-18 (×33): 5 mg via ORAL
  Filled 2023-02-01 (×33): qty 1

## 2023-02-01 MED ORDER — LIDOCAINE HCL (PF) 1 % IJ SOLN
INTRAMUSCULAR | Status: AC
  Filled 2023-02-01: qty 30

## 2023-02-01 MED ORDER — HYDRALAZINE HCL 20 MG/ML IJ SOLN: 10.0000 mg | INTRAMUSCULAR | Status: AC | PRN

## 2023-02-01 MED ORDER — FENTANYL CITRATE (PF) 100 MCG/2ML IJ SOLN
INTRAMUSCULAR | Status: AC
  Filled 2023-02-01: qty 2

## 2023-02-01 MED ORDER — SODIUM CHLORIDE 0.9% FLUSH: 3.0000 mL | INTRAVENOUS | Status: DC | PRN

## 2023-02-01 MED ORDER — HEPARIN SODIUM (PORCINE) 5000 UNIT/ML IJ SOLN
5000.0000 [IU] | Freq: Three times a day (TID) | INTRAMUSCULAR | Status: DC
  Administered 2023-02-01 (×2): 5000 [IU] via SUBCUTANEOUS
  Filled 2023-02-01 (×3): qty 1

## 2023-02-01 MED ORDER — METOPROLOL TARTRATE 5 MG/5ML IV SOLN
INTRAVENOUS | Status: AC | PRN
Start: 2023-02-01 — End: 2023-02-01
  Administered 2023-02-01: 2.5 mg via INTRAVENOUS

## 2023-02-01 MED ORDER — VERAPAMIL HCL 2.5 MG/ML IV SOLN
INTRAVENOUS | Status: AC
  Filled 2023-02-01: qty 2

## 2023-02-01 MED ORDER — MIDAZOLAM HCL 2 MG/2ML IJ SOLN
INTRAMUSCULAR | Status: AC
  Filled 2023-02-01: qty 2

## 2023-02-01 MED ORDER — AMIODARONE HCL IN DEXTROSE 360-4.14 MG/200ML-% IV SOLN
60.0000 mg/h | INTRAVENOUS | Status: AC
  Administered 2023-02-01: 60 mg/h via INTRAVENOUS
  Filled 2023-02-01: qty 200

## 2023-02-01 MED ORDER — HEPARIN (PORCINE) IN NACL 1000-0.9 UT/500ML-% IV SOLN
INTRAVENOUS | Status: DC | PRN
  Administered 2023-02-01 (×2): 500 mL

## 2023-02-01 MED ORDER — METOPROLOL TARTRATE 5 MG/5ML IV SOLN
INTRAVENOUS | Status: AC
  Filled 2023-02-01: qty 5

## 2023-02-01 MED ORDER — LIDOCAINE HCL (CARDIAC) PF 100 MG/5ML IV SOSY
PREFILLED_SYRINGE | INTRAVENOUS | Status: AC | PRN
  Administered 2023-02-01: 100 mg via INTRAVENOUS

## 2023-02-01 MED ORDER — NITROGLYCERIN 1 MG/10 ML FOR IR/CATH LAB
INTRA_ARTERIAL | Status: AC
  Filled 2023-02-01: qty 10

## 2023-02-01 MED ORDER — EPINEPHRINE 1 MG/10ML IJ SOSY
PREFILLED_SYRINGE | INTRAMUSCULAR | Status: AC | PRN
Start: 2023-02-01 — End: 2023-02-01
  Administered 2023-02-01: 1 mg via INTRAVENOUS

## 2023-02-01 MED ORDER — AMIODARONE HCL IN DEXTROSE 360-4.14 MG/200ML-% IV SOLN
INTRAVENOUS | Status: AC
  Administered 2023-02-01: 60 mg/h via INTRAVENOUS
  Filled 2023-02-01: qty 200

## 2023-02-01 MED ORDER — MORPHINE SULFATE (PF) 2 MG/ML IV SOLN
1.0000 mg | Freq: Once | INTRAVENOUS | Status: AC
  Administered 2023-02-01: 1 mg via INTRAVENOUS

## 2023-02-01 MED ORDER — MAGNESIUM SULFATE 4 GM/100ML IV SOLN
4.0000 g | Freq: Once | INTRAVENOUS | Status: AC
  Administered 2023-02-01: 4 g via INTRAVENOUS
  Filled 2023-02-01: qty 100

## 2023-02-01 MED ORDER — LABETALOL HCL 5 MG/ML IV SOLN: 10.0000 mg | INTRAVENOUS | Status: AC | PRN

## 2023-02-01 MED ORDER — PERFLUTREN LIPID MICROSPHERE
1.0000 mL | INTRAVENOUS | Status: AC | PRN
  Administered 2023-02-01: 2 mL via INTRAVENOUS

## 2023-02-01 MED ORDER — AMIODARONE HCL IN DEXTROSE 360-4.14 MG/200ML-% IV SOLN
30.0000 mg/h | INTRAVENOUS | Status: DC
  Administered 2023-02-02 – 2023-02-06 (×19): 60 mg/h via INTRAVENOUS
  Administered 2023-02-07: 30 mg/h via INTRAVENOUS
  Administered 2023-02-07: 60 mg/h via INTRAVENOUS
  Administered 2023-02-08 (×3): 30 mg/h via INTRAVENOUS
  Filled 2023-02-01: qty 200
  Filled 2023-02-01: qty 400
  Filled 2023-02-01 (×4): qty 200
  Filled 2023-02-01: qty 600
  Filled 2023-02-01 (×16): qty 200

## 2023-02-01 MED ORDER — METOPROLOL TARTRATE 5 MG/5ML IV SOLN: 2.5000 mg | Freq: Once | INTRAVENOUS | Status: DC

## 2023-02-01 MED ORDER — LIDOCAINE IN D5W 4-5 MG/ML-% IV SOLN
1.0000 mg/min | INTRAVENOUS | Status: DC
  Administered 2023-02-01: 1 mg/min via INTRAVENOUS
  Administered 2023-02-02 – 2023-02-03 (×2): 2 mg/min via INTRAVENOUS
  Administered 2023-02-04 (×2): 1.5 mg/min via INTRAVENOUS
  Administered 2023-02-05: 1 mg/min via INTRAVENOUS
  Filled 2023-02-01 (×4): qty 500

## 2023-02-01 SURGICAL SUPPLY — 17 items
CATH BALLN WEDGE 5F 110CM (CATHETERS) IMPLANT
CATH INFINITI AMBI 5FR TG (CATHETERS) IMPLANT
CATH SWAN GANZ 7F STRAIGHT (CATHETERS) IMPLANT
CATH-GARD ARROW CATH SHIELD (MISCELLANEOUS) ×1
DEVICE RAD COMP TR BAND LRG (VASCULAR PRODUCTS) IMPLANT
GLIDESHEATH SLEND SS 6F .021 (SHEATH) IMPLANT
GUIDEWIRE INQWIRE 1.5J.035X260 (WIRE) IMPLANT
INQWIRE 1.5J .035X260CM (WIRE) ×1
KIT HEART LEFT (KITS) ×1 IMPLANT
PACK CARDIAC CATHETERIZATION (CUSTOM PROCEDURE TRAY) ×1 IMPLANT
SHEATH GLIDE SLENDER 4/5FR (SHEATH) IMPLANT
SHEATH PINNACLE 7F 10CM (SHEATH) IMPLANT
SHIELD CATHGARD ARROW (MISCELLANEOUS) IMPLANT
TRANSDUCER W/STOPCOCK (MISCELLANEOUS) ×1 IMPLANT
TUBING CIL FLEX 10 FLL-RA (TUBING) ×1 IMPLANT
WIRE MICRO SET SILHO 5FR 7 (SHEATH) IMPLANT
WIRE MICROINTRODUCER 60CM (WIRE) IMPLANT

## 2023-02-01 NOTE — H&P (View-Only) (Signed)
Ventricular bigeminy, trigeminy through the evening with episodes of NSVT for which amiodarone was started. At 10:52, she had VT arrest X2, with one she lost pulse, was successfully defibrillated. Had brief pause after defibrillation but returned to sinus rhythm with ROSC. She has no new chest pain at this time. 12 lead EKG does not show new ST elevation, show incomplete LBBB and age indeterminate septal infarct. However, given her stent thrombosis yesterday, low EF, severe AS, I recommend re look angiography, along with RHC to assess invasive hemodynamics. I updated patient at bedside and family over the phone.  CRITICAL CARE Performed by: Truett Mainland   Total critical care time: 35 minutes   Critical care time was exclusive of separately billable procedures and treating other patients.   Critical care was necessary to treat or prevent imminent or life-threatening deterioration.   Critical care was time spent personally by me on the following activities: development of treatment plan with patient and/or surrogate as well as nursing, discussions with consultants, evaluation of patient's response to treatment, examination of patient, obtaining history from patient or surrogate, ordering and performing treatments and interventions, ordering and review of laboratory studies, ordering and review of radiographic studies, pulse oximetry and re-evaluation of patient's condition.      Elder Negus, MD Pager: [phone] Office: [phone]

## 2023-02-01 NOTE — Progress Notes (Addendum)
eLink Physician-Brief Progress Note Patient Name: Madison West DOB: [birth year] MRN: [id number]   Date of Service  02/01/2023  HPI/Events of Note  Reports chest and back pain 8/10. Chest pain is middle of the chest. Back pain is not associated with where the pads were located. Documentation reports no chest compressions during pulseless VT arrest earlier this evening. Prior CXR with no rib fractures  eICU Interventions  EKG  Morphine 1 mg now   1:30 AM: No acute changes compared to prior on EKG. Repeat trop elevated post-cath >24K. Did not respond to morphine. Give additional 1 mg morphine. Ground team has also been requested to reassess at bedside.  LA mildly elevated 2.4. Ground team reports some volume overload. No action at this time.   Intervention Category Intermediate Interventions: Pain - evaluation and management   Mechele Collin 02/01/2023, 1:03 AM

## 2023-02-01 NOTE — Interval H&P Note (Signed)
History and Physical Interval Note:  02/01/2023 11:44 PM  Madison West  has presented today for surgery, with the diagnosis of STEMI.  The various methods of treatment have been discussed with the patient and family. After consideration of risks, benefits and other options for treatment, the patient has consented to  Procedure(s): Coronary/Graft Acute MI Revascularization (N/A) as a surgical intervention.  The patient's history has been reviewed, patient examined, no change in status, stable for surgery.  I have reviewed the patient's chart and labs.  Questions were answered to the patient's satisfaction.       J 

## 2023-02-01 NOTE — Progress Notes (Signed)
1800: RN noticed rhythm change on monitor fluctuating between vent trigeminy and Vtach. EKGs obtained, not flowing over into Epic. Cardiologist notified, EKGs provided. Orders obtained; Amio bolus followed by 60mg /hr then 30mg /hr after 6hrs. Zoll pads applied.

## 2023-02-01 NOTE — Progress Notes (Signed)
Ventricular bigeminy, trigeminy through the evening with episodes of NSVT for which amiodarone was started. At 10:52, she had VT arrest X2, with one she lost pulse, was successfully defibrillated. Had brief pause after defibrillation but returned to sinus rhythm with ROSC. She has no new chest pain at this time. 12 lead EKG does not show new ST elevation, show incomplete LBBB and age indeterminate septal infarct. However, given her stent thrombosis yesterday, low EF, severe AS, I recommend re look angiography, along with RHC to assess invasive hemodynamics. I updated patient at bedside and family over the phone.  CRITICAL CARE Performed by: Truett Mainland   Total critical care time: 35 minutes   Critical care time was exclusive of separately billable procedures and treating other patients.   Critical care was necessary to treat or prevent imminent or life-threatening deterioration.   Critical care was time spent personally by me on the following activities: development of treatment plan with patient and/or surrogate as well as nursing, discussions with consultants, evaluation of patient's response to treatment, examination of patient, obtaining history from patient or surrogate, ordering and performing treatments and interventions, ordering and review of laboratory studies, ordering and review of radiographic studies, pulse oximetry and re-evaluation of patient's condition.      Elder Negus, MD Pager: [phone] Office: [phone]

## 2023-02-01 NOTE — Progress Notes (Signed)
eLink Physician-Brief Progress Note Patient Name: Madison West DOB: 1955/08/03 MRN: [id number]   Date of Service  02/01/2023  HPI/Events of Note  VT/torsades arrest >CPR initiated> PEA arrest with ROSC. Patient able to answer say name AFRVR. Cardiology ordered metoprolol 2.5 mg Normotensive Receiving Mg  eICU Interventions  Ground team arrived. Cardiology involved Continue amio Tele   Went to cath lab. Stent patent. Diuresed. Returned to floor  Intervention Category Major Interventions: Code management / supervision   Mechele Collin 02/01/2023, 10:52 PM

## 2023-02-01 NOTE — Consult Note (Signed)
NAME:  Madison West, MRN:  [id number], DOB:  31-Jul-1955, LOS: 8 ADMISSION DATE:  01/23/2023, CONSULTATION DATE:  02/01/2023  REFERRING MD:  TRH, pokhrel, CHIEF COMPLAINT:  VT arrest   History of Present Illness:  67 yo F w/ pertinent PMH PSVT, HTN, HLD, R carotid artery stenosis s/p endarterectomy 2020, CAD s/p stenting 2021, moderate aortic stenosis presents to [facility] ED on 8/9 w/ SOB.   Patient has been having SOB that has been getting progressively worse. Patient states she has been gaining weight and feels fatigued. Patient also having some anginal chest pain similar to when she needed coronary interventions back in 2021. Patient admitted to [facility] on 8/9 and treated for new onset CHF. Echo showing LVEF 30-35%; regional wall motion abnormalities; moderate aortic stenosis. BNP 462. EKG showing PVC, LAE, LAFB w/ miniaml ST elevation. Patient underwent cardiac cath on 8.7 showing mid LAD 95% lesion w/ LAD stent placement. CT surgery consulted for severe aortic stenosis. Patient has been having episodes of non sustained v tach started on metoprolol.    On 8/11, patient on floors and had vtach w/ no pulse. CPR initiated, given epi, and shocked x2 with ROSC achieved. Patient alert after cpr event but slightly confused. Patient again went into Vtach rhythm and was shocked again. Given amio, mag, calcium. Patient started on amio drip. Patient transferred to [facility] ICU and cardiology consulted. EKG showing ST elevation in anterior leads. Code STEMI. PCCM consulted.  Pertinent  Medical History  PSVT, anxiety/depression,  breast cancer status post lumpectomy, chemo and radiation in 2009, hypertension,  hyperlipidemia,  right carotid artery stenosis status post carotid endarterectomy in 2020, CAD status post stent to LAD 8/7  Significant Hospital Events: Including procedures, antibiotic start and stop dates in addition to other pertinent events   01/31/2023 for left heart catheterization intervention on the  mid LAD.  Interim History / Subjective:   Patient much more awake alert.  Did well with procedure yesterday vital signs stable.  She says she still feels sore in the chest.  Likely from chest compressions  Objective   Blood pressure 93/65, pulse 84, temperature 97.6 F (36.4 C), temperature source Axillary, resp. rate 18, height 5\' 8"  (1.727 m), weight 93.1 kg, SpO2 91%.        Intake/Output Summary (Last 24 hours) at 02/01/2023 0808 Last data filed at 02/01/2023 0630 Gross per 24 hour  Intake 1072.88 ml  Output --  Net 1072.88 ml   Filed Weights   01/30/23 0534 01/31/23 0430 02/01/23 0500  Weight: 93.8 kg 93.3 kg 93.1 kg    Examination: Gen. alert oriented following commands ENT -NCAT tracking appropriately Neck: No JVD Lungs: Clear to auscultation bilaterally no crackles no wheeze Cardiovascular: Regular rate rhythm S1-S2, systolic murmur  Abdomen: Soft nontender nondistended Musculoskeletal: No significant edema Neuro: Alert following commands no focal deficit  Presenting: EKG shows ST elevations V1-V3 Rhythm on monitor initially showed runs of VT but then settled down into sinus tachycardia, bigeminy rhythm  Labs show mild leukocytosis, stable hemoglobin  Resolved Hospital Problem list     Assessment & Plan:   VT arrest in the setting of recent LAD stent, EKG showing anterior wall STEMI, likely in-stent restenosis Acute coronary syndrome, taken to the Cath Lab with successful percutaneous intervention on the mid LAD Plan: Amiodarone stopped Off heparin, on DVT prophylaxis Continue metoprolol Continue Crestor Monitor electrolytes Will observe in the ICU today, consider transfer as early as tomorrow. Work with PT OT Stop lasix  Chest wall pain, s/p CPR  P: PRN Oxy  Acute systolic heart failure -EF 30-35% with akinetic apex and septum on echocardiogram  Severe AS -Plan was for TAVR as outpatient  Acute hypoxic respiratory failure, resolved    Hypertension -on losartan and amlodipine Severe anxiety -on BuSpar and Xanax  Hyperglycemia  P: Recheck hyperglycemia    Best Practice (right click and "Reselect all SmartList Selections" daily)   Diet/type: NPO DVT prophylaxis: systemic heparin GI prophylaxis: N/A Lines: N/A Foley:  N/A Code Status:  full code Last date of multidisciplinary goals of care discussion [NA] daughter updated by code team  Labs   CBC: Recent Labs  Lab 01/29/23 0614 01/29/23 1658 01/30/23 0322 01/31/23 2010 02/01/23 0529  WBC 6.2 6.8 6.9 13.7* 12.8*  HGB 13.9 14.2 13.5 14.1 13.0  HCT 40.6 42.0 40.3 46.0 38.5  MCV 93.8 93.8 97.1 102.7* 96.5  PLT 387 377 405* 433* 442*    Basic Metabolic Panel: Recent Labs  Lab 01/27/23 2321 01/28/23 0935 01/29/23 0614 01/29/23 1658 01/30/23 0322 01/31/23 0645 01/31/23 2010 02/01/23 0529  NA 131* 131* 130*  --  132*  --  130* 130*  K 5.2* 4.2 4.5  --  4.8  --  5.5* 4.2  CL 100 98 97*  --  100  --  95* 98  CO2 20* 25 23  --  24  --  19* 23  GLUCOSE 135* 113* 113*  --  109*  --  178* 151*  BUN 18 16 20   --  22  --  23 18  CREATININE 0.70 0.67 0.81 0.76 0.70 0.73 0.98 0.68  CALCIUM 9.0 9.0 8.8*  --  8.8*  --  8.9 8.8*  MG 2.1  --  2.2  --  2.2  --  2.6* 2.2   GFR: Estimated Creatinine Clearance: 79.2 mL/min (by C-G formula based on SCr of 0.68 mg/dL). Recent Labs  Lab 01/29/23 1658 01/30/23 0322 01/31/23 2010 02/01/23 0104 02/01/23 0340 02/01/23 0529  WBC 6.8 6.9 13.7*  --   --  12.8*  LATICACIDVEN  --   --   --  2.3* 1.4 1.3    Liver Function Tests: No results for input(s): "AST", "ALT", "ALKPHOS", "BILITOT", "PROT", "ALBUMIN" in the last 168 hours. No results for input(s): "LIPASE", "AMYLASE" in the last 168 hours. No results for input(s): "AMMONIA" in the last 168 hours.  ABG    Component Value Date/Time   PHART 7.373 01/27/2023 1737   PCO2ART 43.0 01/27/2023 1737   PO2ART 60 (L) 01/27/2023 1737   HCO3 25.0 01/27/2023 1737    HCO3 26.4 01/27/2023 1737   TCO2 26 01/27/2023 1737   TCO2 28 01/27/2023 1737   O2SAT 90 01/27/2023 1737   O2SAT 64 01/27/2023 1737     Coagulation Profile: No results for input(s): "INR", "PROTIME" in the last 168 hours.  Cardiac Enzymes: No results for input(s): "CKTOTAL", "CKMB", "CKMBINDEX", "TROPONINI" in the last 168 hours.  HbA1C: Hgb A1c MFr Bld  Date/Time Value Ref Range Status  10/15/2017 04:24 PM 5.8 (H) 4.8 - 5.6 % Final    Comment:             Prediabetes: 5.7 - 6.4          Diabetes: >6.4          Glycemic control for adults with diabetes: <7.0   10/06/2011 10:06 AM 5.6 <5.7 % Final    Comment:    (NOTE)  According to the ADA Clinical Practice Recommendations for 2011, when HbA1c is used as a screening test:  >=6.5%   Diagnostic of Diabetes Mellitus           (if abnormal result is confirmed) 5.7-6.4%   Increased risk of developing Diabetes Mellitus References:Diagnosis and Classification of Diabetes Mellitus,Diabetes Care,2011,34(Suppl 1):S62-S69 and Standards of Medical Care in         Diabetes - 2011,Diabetes Care,2011,34 (Suppl 1):S11-S61.    CBG: Recent Labs  Lab 01/31/23 1942 01/31/23 2317 02/01/23 0321 02/01/23 0722  GLUCAP 121* 175* 149* 119*     Josephine Igo, DO [facility] 02/01/2023 8:08 AM

## 2023-02-01 NOTE — TOC Progression Note (Signed)
Transition of Care [facility]) - Progression Note    Patient Details  Name: Madison West MRN: [id number] Date of Birth: 06/23/1955  [facility]) CM/SW Contact  Graves-Bigelow, Lamar Laundry, RN Phone Number: 02/01/2023, 2:32 PM  Clinical Narrative: Patient was discussed in morning progression rounds. Patient post stent- on Brilinta co pay is co-pay is $81.42. Per notes pending outpatient TAVR consult. Case Manager will continue to follow for additional transition of care needs as the patient progresses.   Expected Discharge Plan: Home/Self Care Barriers to Discharge: Continued Medical Work up  Expected Discharge Plan and Services In-house Referral: NA Discharge Planning Services: CM Consult Post Acute Care Choice: NA Living arrangements for the past 2 months: Single Family Home  Social Determinants of Health (SDOH) Interventions SDOH Screenings   Food Insecurity: No Food Insecurity (01/24/2023)  Housing: Low Risk  (01/24/2023)  Transportation Needs: No Transportation Needs (01/24/2023)  Utilities: Not At Risk (01/24/2023)  Depression (PHQ2-9): Low Risk  (01/19/2020)  Tobacco Use: Medium Risk (01/24/2023)    Readmission Risk Interventions     No data to display

## 2023-02-01 NOTE — Plan of Care (Signed)
  Problem: Education: Goal: Knowledge of General Education information will improve Description: Including pain rating scale, medication(s)/side effects and non-pharmacologic comfort measures Outcome: Progressing   Problem: Health Behavior/Discharge Planning: Goal: Ability to manage health-related needs will improve Outcome: Progressing   Problem: Clinical Measurements: Goal: Ability to maintain clinical measurements within normal limits will improve Outcome: Progressing Goal: Will remain free from infection Outcome: Progressing Goal: Respiratory complications will improve Outcome: Progressing Goal: Cardiovascular complication will be avoided Outcome: Not Progressing   Problem: Activity: Goal: Risk for activity intolerance will decrease Outcome: Progressing   Problem: Nutrition: Goal: Adequate nutrition will be maintained Outcome: Progressing   Problem: Coping: Goal: Level of anxiety will decrease Outcome: Progressing   Problem: Elimination: Goal: Will not experience complications related to bowel motility Outcome: Progressing Goal: Will not experience complications related to urinary retention Outcome: Progressing   Problem: Pain Managment: Goal: General experience of comfort will improve Outcome: Progressing   Problem: Safety: Goal: Ability to remain free from injury will improve Outcome: Progressing   Problem: Skin Integrity: Goal: Risk for impaired skin integrity will decrease Outcome: Progressing   Problem: Education: Goal: Understanding of CV disease, CV risk reduction, and recovery process will improve Outcome: Progressing Goal: Individualized Educational Video(s) Outcome: Progressing   Problem: Activity: Goal: Ability to return to baseline activity level will improve Outcome: Progressing   Problem: Cardiovascular: Goal: Ability to achieve and maintain adequate cardiovascular perfusion will improve Outcome: Progressing Goal: Vascular access site(s)  Level 0-1 will be maintained Outcome: Progressing   Problem: Health Behavior/Discharge Planning: Goal: Ability to safely manage health-related needs after discharge will improve Outcome: Progressing

## 2023-02-01 NOTE — Progress Notes (Addendum)
Subjective:  States that she feels like she is Rourk bilateral, but it is better than yesterday. She continues to have chest pain but it is reproducible with palpation, and deep breathing.  She did undergo some CPR for 2 min.  Pain is better with oxycodone and Tylenol.   Current Facility-Administered Medications:    0.9 %  sodium chloride infusion, 250 mL, Intravenous, PRN, Norins, Rosalyn Gess, MD   0.9 %  sodium chloride infusion, , Intravenous, Continuous, ,  J, MD, Last Rate: 10 mL/hr at 02/01/23 1004, New Bag at 02/01/23 1004   0.9 %  sodium chloride infusion, 250 mL, Intravenous, PRN, ,  J, MD   0.9 %  sodium chloride infusion, 250 mL, Intravenous, PRN, ,  J, MD   acetaminophen (TYLENOL) tablet 650 mg, 650 mg, Oral, Q4H PRN, ,  J, MD, 650 mg at 02/01/23 1010   ALPRAZolam (XANAX) tablet 1 mg, 1 mg, Oral, Q6H, Icard, Bradley L, DO, 1 mg at 02/01/23 0833   alum & mag hydroxide-simeth (MAALOX/MYLANTA) 200-200-20 MG/5ML suspension 30 mL, 30 mL, Oral, Q6H PRN, Pokhrel, Laxman, MD, 30 mL at 02/01/23 6578   aspirin chewable tablet 81 mg, 81 mg, Oral, Daily, Norins, Rosalyn Gess, MD, 81 mg at 02/01/23 1009   busPIRone (BUSPAR) tablet 5 mg, 5 mg, Oral, BID, Norins, Rosalyn Gess, MD, 5 mg at 02/01/23 1010   calcium carbonate (TUMS - dosed in mg elemental calcium) chewable tablet 200 mg of elemental calcium, 1 tablet, Oral, TID PRN, Tolia, Sunit, DO   Chlorhexidine Gluconate Cloth 2 % PADS 6 each, 6 each, Topical, Daily, Chand, Sudham, MD   heparin injection 5,000 Units, 5,000 Units, Subcutaneous, Q8H, ,  J, MD   hydrALAZINE (APRESOLINE) injection 10 mg, 10 mg, Intravenous, Q20 Min PRN, ,  J, MD   labetalol (NORMODYNE) injection 10 mg, 10 mg, Intravenous, Q10 min PRN, ,  J, MD   metoprolol succinate (TOPROL-XL) 24 hr tablet 25 mg, 25 mg, Oral, Daily, ,  J, MD, 25 mg at 02/01/23 1010    morphine (PF) 2 MG/ML injection 1 mg, 1 mg, Intravenous, Q3H PRN, Luciano Cutter, MD, 1 mg at 02/01/23 0130   nitroGLYCERIN (NITROSTAT) SL tablet 0.4 mg, 0.4 mg, Sublingual, Q5 min PRN, Norins, Rosalyn Gess, MD   omega-3 acid ethyl esters (LOVAZA) capsule 1 g, 1 g, Oral, Daily, Norins, Rosalyn Gess, MD, 1 g at 02/01/23 1009   ondansetron (ZOFRAN) injection 4 mg, 4 mg, Intravenous, Q6H PRN, ,  J, MD   Oral care mouth rinse, 15 mL, Mouth Rinse, PRN, Norins, Rosalyn Gess, MD   oxyCODONE (Oxy IR/ROXICODONE) immediate release tablet 5 mg, 5 mg, Oral, Q4H PRN, Pia Mau D, PA-C, 5 mg at 02/01/23 4696   pantoprazole (PROTONIX) EC tablet 40 mg, 40 mg, Oral, Daily, Norins, Rosalyn Gess, MD, 40 mg at 02/01/23 1010   perflutren lipid microspheres (DEFINITY) IV suspension, 1-10 mL, Intravenous, PRN, Icard, Bradley L, DO, 2 mL at 02/01/23 0904   potassium chloride SA (KLOR-CON M) CR tablet 20 mEq, 20 mEq, Oral, Daily, Pokhrel, Laxman, MD, 20 mEq at 02/01/23 1010   rosuvastatin (CRESTOR) tablet 20 mg, 20 mg, Oral, Daily, Norins, Rosalyn Gess, MD   sodium chloride flush (NS) 0.9 % injection 3 mL, 3 mL, Intravenous, Q12H, Norins, Rosalyn Gess, MD, 3 mL at 01/30/23 2203   sodium chloride flush (NS) 0.9 % injection 3 mL, 3 mL, Intravenous, PRN, Norins, Rosalyn Gess, MD   sodium chloride flush (NS) 0.9 %  injection 3 mL, 3 mL, Intravenous, Q12H, ,  J, MD, 3 mL at 01/31/23 0850   sodium chloride flush (NS) 0.9 % injection 3 mL, 3 mL, Intravenous, PRN, ,  J, MD   sodium chloride flush (NS) 0.9 % injection 3 mL, 3 mL, Intravenous, Q12H, ,  J, MD   sodium chloride flush (NS) 0.9 % injection 3 mL, 3 mL, Intravenous, PRN, ,  J, MD   ticagrelor (BRILINTA) tablet 90 mg, 90 mg, Oral, BID, ,  J, MD, 90 mg at 02/01/23 1010   Objective:  Vital Signs in the last 24 hours: Temp:  [97.6 F (36.4 C)-99 F (37.2 C)] 97.6 F (36.4 C) (08/12  0724) Pulse Rate:  [65-103] 87 (08/12 1010) Resp:  [14-32] 18 (08/12 0700) BP: (88-124)/(64-87) 113/73 (08/12 1010) SpO2:  [90 %-95 %] 91 % (08/12 0700) Weight:  [93.1 kg] 93.1 kg (08/12 0500)  Intake/Output from previous day: 08/11 0701 - 08/12 0700 In: 1072.9 [P.O.:954; I.V.:118.9] Out: -   Physical Exam Vitals and nursing note reviewed.  Constitutional:      General: She is not in acute distress. Neck:     Vascular: No JVD.  Cardiovascular:     Rate and Rhythm: Normal rate and regular rhythm.     Heart sounds: Murmur heard.     Harsh midsystolic murmur is present with a grade of 3/6 at the upper right sternal border radiating to the neck.  Pulmonary:     Effort: Pulmonary effort is normal.     Breath sounds: Normal breath sounds. No wheezing or rales.  Musculoskeletal:     Right lower leg: No edema.     Left lower leg: No edema.      Imaging/tests reviewed and independently interpreted: CXR 01/31/2023: 1. Patchy right basilar opacities, likely atelectasis. 2. Blunting of the left costophrenic angle, which may represent a small pleural effusion.     Cardiac Studies:  Telemetry 02/01/2023: Intermittent PVCs, no known nonsustained VT/sustained VT  EKG 02/09/2023: Sinus rhythm Anteroseptal infarct, age indeterminate  Coronary intervention 01/31/2023: LM: Normal LAD: 95% mid LAD stenosis with mild calcification and moderate tortuosity (New since 2021)          40% downstream lesion in mid LAD (New since 2021)          Otherwise mild diffuse disease Lcx: Mid diffuse 50% disease (Previously deemed 80% in 2021) Ramus: Patent stent. No restenosis RCA: Not engaged today     Successful percutaneous coronary intervention mid LAD     Aspiration thrombectomy     IVUS guided balloon dilatation with 3.25 and 3.5 mm [place] balloons up to 20 atm 0% residual stenosis at the stented segment. IVUS MSA 8.1 mm2, prior to final 3.5 [employer] balloon expansion at 20 atm     Coronary  intervention 01/29/2023: (Please see diagnostic coronary angiogram report 01/27/2023) Mid LAD 95% stenosis Successful percutaneous coronary intervention mid LAD        PTCA and stent placement 3.0 X 22 mm Onyx Frontier drug-eluting stent        Post dilatation using 3.25 X 12 mm [place] balloon up tp 18 atm 0% residual stenosis, TIMI flow III       LHC/RHC 01/27/2023: LM: Normal LAD: 95% mid LAD stenosis with mild calcification and moderate tortuosity (New since 2021)          40% downstream lesion in mid LAD (New since 2021)          Otherwise mild diffuse  disease Lcx: Mid diffuse 50% disease (Previously deemed 80% in 2021) Ramus: Patent stent. No restenosis RCA: Mid eccentric 40% disease. Rest mild diffuse disease   RA: 4 mmHg RV: 31/2 mmHg PA: 23/10 mmHg, mPAP 14 mmHg PCW: 4 mmHg   LV: 132/0 mmHg Ao: 101/59 mmHg CO: 5.5 L/min CI: 2.7 L/min/m2   AoV mean PG 29 mmHg AVA 1.0 cm2, AVAi 0.5 cm2/.m2   Exertional angina and dyspnea Single vessel obstructive disease  Compensated ischemic or valvular cardiomyopathy (EF 30-35% with akinetic apex and septum on echocardiogram) Probable low flow low gradient severe AS GDMT for HFrEF likely limited by low blood pressure in the setting of severe AS    Limited echocardiogram ordered 02/01/2023: Pending  Echocardiogram 01/25/2023:  1. Left ventricular ejection fraction, by estimation, is 30 to 35%. The  left ventricle has moderately decreased function. The left ventricle  demonstrates regional wall motion abnormalities (see scoring  diagram/findings for description). There is mild  left ventricular hypertrophy. Indeterminate diastolic filling due to E-A  fusion. The entire septum and apex are hypokinetic.   2. Right ventricular systolic function is low normal. The right  ventricular size is normal.   3. The mitral valve is grossly normal. No evidence of mitral valve  regurgitation.   4. Native valve, moderate to severe calcification,  reduced leaflet  excursion, trace aortic regurgitation, mild to moderate aortic stenosis  (peak velocity 2.29m/s, Peak Gradient 33 mmHg, Mean gradient 20 mmHg, AVA  VTI 0.93cm2, DI 0.33, SVi 25cc (severity   of aortic stenosis is likely underestimated due to low flow - low  gradient AS).   5. The inferior vena cava is normal in size with greater than 50%  respiratory variability, suggesting right atrial pressure of 3 mmHg.   6. Rhythm strip during this exam demonstrates normal sinus rhythm.   Comparison(s): Prior study 05/25/2022: LVEF 55-60%, Grade I diastolic  dysfunction, mild to moderate AS, see report for more details.     Assessment & Recommendations:  67 year old Caucasian female with hypertension, hyperlipidemia, CAD, h/o right carotid endarterectomy, h/o breast cancer, HFrEF, moderate to severe AS, s/p mid LAD PCI 01/29/2023, with VT arrest, ROSC with multiple shocks, 2 min CPR- secondary to acute stent thrombosis STEMI-treated with aspiration thrombectomy and IVUS guided stent expansion  STEMI: Acute stent thrombosis-->VT arrest. Trop >24000. Possibly mild underexpansion of the proximal portion of the stent, also concern for inadequate platelet inhibition with Plavix. Treated with aspiration thrombectomy and IVUS guided stent expansion using 3.5 mm [place] balloon up to 20 atm with restoration of TIMI-3 flow. I did not think additional stent was necessary in absence of obvious stent edge dissection, and increased risk of stent thrombosis with overlapping stents. ST elevation resolved on EKG during the procedure.  EKG today shows.  Limited septal infarct. Current chest pain is reproducible and pleuritic, likely secondary to chest compressions during CPR. Recommend aspirin and Brilinta as dual antiplatelet therapy, for at least 1 year, but possibly for longer duration. Continue metoprolol. She is refusing statins due to prior side effects. She also has elevated lipoprotein (a). Will  absolutely need PCSK 9 inhibitor outpatient. I doubt she will participate in any Lp(a) clinical trial.  HFrEF: Likely combination of valvular and ischemic cardiomyopathy. I recommend MAC for the drop in her LVEF with stent thrombosis on 01/31/2023. Limited echocardiogram ordered. Blood pressures are tolerating losartan, spironolactone, and metoprolol.  Continue the same.  Aortic stenosis: Very likely low-flow low gradient severe restenosis.   CT sans  done. Pending outpatient TAVR consult with Dr copper on 8/26.  CRITICAL CARE Performed by: Truett Mainland   Total critical care time: 35 minutes   Critical care time was exclusive of separately billable procedures and treating other patients.   Critical care was necessary to treat or prevent imminent or life-threatening deterioration.   Critical care was time spent personally by me on the following activities: development of treatment plan with patient and/or surrogate as well as nursing, discussions with consultants, evaluation of patient's response to treatment, examination of patient, obtaining history from patient or surrogate, ordering and performing treatments and interventions, ordering and review of laboratory studies, ordering and review of radiographic studies, pulse oximetry and re-evaluation of patient's condition.       Discussed interpretation of tests and management recommendations with the primary team   Elder Negus, MD Pager: [phone] Office: [phone]

## 2023-02-01 NOTE — Evaluation (Signed)
Physical Therapy Evaluation Patient Details Name: Madison West MRN: [id number] DOB: 1956/04/17 Today's Date: 02/01/2023  History of Present Illness  Patient is a 67 y/o female admitted 01/24/23 with SOB due to acute CHF.  She underwent cath on 01/27/23 with findings of mid LAD 95% lesion and had stent placed.  Plans for TAVR as outpatient due to severe AS.  Had vtach arrest on 8/11 underwent CPR and shock x 2 with ROSC achieved, went into vtach again requiring another shock.  Found to have STEMI.  PMH positive for PSVT, anxiety, depression, breast CA s/p lumpectomy, chemo, XRT, HTN, HLD, R CEA, CAD.  Clinical Impression  Patient presents with decreased mobility due to pain in chest, decreased activity tolerance and decreased cardiopulmonary endurance.  She was previously independent and working, currently needing occasional CGA for mobility in the room and into hallway.  She will benefit from skilled PT in the acute setting, though do not anticipate follow up PT at d/c.          If plan is discharge home, recommend the following: Assistance with cooking/housework;Assist for transportation;Help with stairs or ramp for entrance   Can travel by private vehicle        Equipment Recommendations None recommended by PT  Recommendations for Other Services       Functional Status Assessment Patient has had a recent decline in their functional status and demonstrates the ability to make significant improvements in function in a reasonable and predictable amount of time.     Precautions / Restrictions Precautions Precautions: Fall      Mobility  Bed Mobility Overal bed mobility: Needs Assistance Bed Mobility: Supine to Sit     Supine to sit: Contact guard     General bed mobility comments: assist for lines and due to chest pain    Transfers Overall transfer level: Needs assistance   Transfers: Sit to/from Stand, Bed to chair/wheelchair/BSC Sit to Stand: Supervision   Step  pivot transfers: Contact guard assist       General transfer comment: no assist to stand, assist to [facility] due to lines    Ambulation/Gait Ambulation/Gait assistance: Contact guard assist Gait Distance (Feet): 150 Feet Assistive device: Rolling walker (2 wheels) Gait Pattern/deviations: Step-through pattern, Decreased stride length       General Gait Details: used RW in hallway due to dizziness, in room some without device  Stairs            Wheelchair Mobility     Tilt Bed    Modified Rankin (Stroke Patients Only)       Balance Overall balance assessment: Needs assistance   Sitting balance-Leahy Scale: Good       Standing balance-Leahy Scale: Fair Standing balance comment: standing to perform perineal hygiene, to pull up panties with S to occasional CGA                             Pertinent Vitals/Pain Pain Assessment Pain Assessment: Faces Faces Pain Scale: Hurts even more Pain Location: chest with mobility Pain Descriptors / Indicators: Grimacing, Guarding, Sore Pain Intervention(s): Monitored during session, Repositioned    Home Living Family/patient expects to be discharged to:: Private residence Living Arrangements: Children Available Help at Discharge: Family;Available PRN/intermittently Type of Home: House Home Access: Stairs to enter Entrance Stairs-Rails: Can reach both Entrance Stairs-Number of Steps: 2   Home Layout: One level Home Equipment: None      Prior Function  Prior Level of Function : Independent/Modified Independent                     Extremity/Trunk Assessment   Upper Extremity Assessment Upper Extremity Assessment: Generalized weakness    Lower Extremity Assessment Lower Extremity Assessment: Generalized weakness       Communication   Communication Communication: No apparent difficulties  Cognition Arousal: Alert Behavior During Therapy: WFL for tasks assessed/performed Overall Cognitive  Status: Within Functional Limits for tasks assessed                                          General Comments General comments (skin integrity, edema, etc.): VSS, though kept on O2 for ambulation 1-2LPM    Exercises     Assessment/Plan    PT Assessment Patient needs continued PT services  PT Problem List Decreased activity tolerance;Decreased mobility;Cardiopulmonary status limiting activity;Pain       PT Treatment Interventions Gait training;Stair training;Functional mobility training;Balance training;Therapeutic activities    PT Goals (Current goals can be found in the Care Plan section)  Acute Rehab PT Goals Patient Stated Goal: return to work PT Goal Formulation: With patient Time For Goal Achievement: 02/15/23 Potential to Achieve Goals: Good    Frequency Min 1X/week     Co-evaluation               AM-PAC PT "6 Clicks" Mobility  Outcome Measure Help needed turning from your back to your side while in a flat bed without using bedrails?: A Little Help needed moving from lying on your back to sitting on the side of a flat bed without using bedrails?: A Little Help needed moving to and from a bed to a chair (including a wheelchair)?: A Little Help needed standing up from a chair using your arms (e.g., wheelchair or bedside chair)?: A Little Help needed to walk in hospital room?: A Little Help needed climbing 3-5 steps with a railing? : Total 6 Click Score: 16    End of Session Equipment Utilized During Treatment: Gait belt;Oxygen Activity Tolerance: Patient tolerated treatment well Patient left: in chair;with call bell/phone within reach   PT Visit Diagnosis: Other abnormalities of gait and mobility (R26.89)    Time: 1610-9604 PT Time Calculation (min) (ACUTE ONLY): 37 min   Charges:   PT Evaluation $PT Eval Low Complexity: 1 Low PT Treatments $Gait Training: 8-22 mins PT General Charges $$ ACUTE PT VISIT: 1 Visit         Madison West, PT Acute Rehabilitation Services [phone] 02/01/2023   Madison West 02/01/2023, 5:49 PM

## 2023-02-01 NOTE — TOC Benefit Eligibility Note (Signed)
Pharmacy Patient [facility] verification completed.    The patient is insured through [employer].     Ran test claim for Brilinta and the current 30 day co-pay is $81.42.  Ran test claim for Entresto and the current 30 day co-pay is $124.09.  Ran test claim for Jardiance and the current 30 day co-pay is $110.23.  Ran test claim for Madison West and the current 30 day co-pay is $105.04.   This test claim was processed through [facility]- copay amounts may vary at other pharmacies due to pharmacy/plan contracts, or as the patient moves through the different stages of their insurance plan.

## 2023-02-02 ENCOUNTER — Telehealth: Payer: Self-pay | Admitting: Cardiology

## 2023-02-02 ENCOUNTER — Inpatient Hospital Stay (HOSPITAL_COMMUNITY)
Admission: EM | Disposition: A | Discharge status: Rehabilitation Facility | Payer: Self-pay | Source: Home / Self Care | Attending: Pulmonary Disease

## 2023-02-02 ENCOUNTER — Inpatient Hospital Stay (HOSPITAL_COMMUNITY): Payer: Medicare Other

## 2023-02-02 ENCOUNTER — Encounter (HOSPITAL_COMMUNITY): Payer: Self-pay | Admitting: Cardiology

## 2023-02-02 ENCOUNTER — Inpatient Hospital Stay (HOSPITAL_COMMUNITY): Payer: Medicare Other | Admitting: Certified Registered Nurse Anesthetist

## 2023-02-02 ENCOUNTER — Encounter (HOSPITAL_COMMUNITY)
Admission: EM | Disposition: A | Discharge status: Rehabilitation Facility | Payer: Self-pay | Source: Home / Self Care | Attending: Pulmonary Disease

## 2023-02-02 DIAGNOSIS — Z87891 Personal history of nicotine dependence: Secondary | ICD-10-CM

## 2023-02-02 DIAGNOSIS — I5031 Acute diastolic (congestive) heart failure: Secondary | ICD-10-CM

## 2023-02-02 DIAGNOSIS — I110 Hypertensive heart disease with heart failure: Secondary | ICD-10-CM | POA: Diagnosis not present

## 2023-02-02 DIAGNOSIS — I4901 Ventricular fibrillation: Secondary | ICD-10-CM

## 2023-02-02 DIAGNOSIS — I4721 Torsades de pointes: Secondary | ICD-10-CM

## 2023-02-02 DIAGNOSIS — I350 Nonrheumatic aortic (valve) stenosis: Secondary | ICD-10-CM | POA: Diagnosis not present

## 2023-02-02 DIAGNOSIS — I25110 Atherosclerotic heart disease of native coronary artery with unstable angina pectoris: Secondary | ICD-10-CM

## 2023-02-02 DIAGNOSIS — I5021 Acute systolic (congestive) heart failure: Secondary | ICD-10-CM | POA: Diagnosis not present

## 2023-02-02 DIAGNOSIS — R570 Cardiogenic shock: Secondary | ICD-10-CM | POA: Insufficient documentation

## 2023-02-02 HISTORY — PX: IABP INSERTION: CATH118242

## 2023-02-02 HISTORY — PX: RIGHT HEART CATH: CATH118263

## 2023-02-02 HISTORY — PX: TEMPORARY PACEMAKER: CATH118268

## 2023-02-02 LAB — POCT I-STAT 7, (LYTES, BLD GAS, ICA,H+H)
Acid-base deficit: 3 mmol/L — ABNORMAL HIGH (ref 0.0–2.0)
Acid-base deficit: 3 mmol/L — ABNORMAL HIGH (ref 0.0–2.0)
Acid-base deficit: 2 mmol/L (ref 0.0–2.0)
Bicarbonate: 22.8 mmol/L (ref 20.0–28.0)
Bicarbonate: 22.4 mmol/L (ref 20.0–28.0)
Bicarbonate: 23.1 mmol/L (ref 20.0–28.0)
Calcium, Ion: 1.45 mmol/L — ABNORMAL HIGH (ref 1.15–1.40)
Calcium, Ion: 1.26 mmol/L (ref 1.15–1.40)
Calcium, Ion: 1.26 mmol/L (ref 1.15–1.40)
HCT: 34 % — ABNORMAL LOW (ref 36.0–46.0)
HCT: 34 % — ABNORMAL LOW (ref 36.0–46.0)
HCT: 36 % (ref 36.0–46.0)
Hemoglobin: 11.6 g/dL — ABNORMAL LOW (ref 12.0–15.0)
Hemoglobin: 11.6 g/dL — ABNORMAL LOW (ref 12.0–15.0)
Hemoglobin: 12.2 g/dL (ref 12.0–15.0)
O2 Saturation: 98 %
O2 Saturation: 100 %
O2 Saturation: 99 %
Patient temperature: 98.6
Patient temperature: 36.3
Potassium: 3.4 mmol/L — ABNORMAL LOW (ref 3.5–5.1)
Potassium: 4.2 mmol/L (ref 3.5–5.1)
Potassium: 3.8 mmol/L (ref 3.5–5.1)
Sodium: 122 mmol/L — ABNORMAL LOW (ref 135–145)
Sodium: 124 mmol/L — ABNORMAL LOW (ref 135–145)
Sodium: 124 mmol/L — ABNORMAL LOW (ref 135–145)
TCO2: 24 mmol/L (ref 22–32)
TCO2: 24 mmol/L (ref 22–32)
TCO2: 24 mmol/L (ref 22–32)
pCO2 arterial: 43.4 mmHg (ref 32–48)
pCO2 arterial: 39.1 mmHg (ref 32–48)
pCO2 arterial: 40 mmHg (ref 32–48)
pH, Arterial: 7.329 — ABNORMAL LOW (ref 7.35–7.45)
pH, Arterial: 7.363 (ref 7.35–7.45)
pH, Arterial: 7.369 (ref 7.35–7.45)
pO2, Arterial: 110 mmHg — ABNORMAL HIGH (ref 83–108)
pO2, Arterial: 189 mmHg — ABNORMAL HIGH (ref 83–108)
pO2, Arterial: 164 mmHg — ABNORMAL HIGH (ref 83–108)

## 2023-02-02 LAB — POCT I-STAT EG7
Acid-base deficit: 5 mmol/L — ABNORMAL HIGH (ref 0.0–2.0)
Acid-base deficit: 2 mmol/L (ref 0.0–2.0)
Acid-base deficit: 4 mmol/L — ABNORMAL HIGH (ref 0.0–2.0)
Acid-base deficit: 5 mmol/L — ABNORMAL HIGH (ref 0.0–2.0)
Acid-base deficit: 6 mmol/L — ABNORMAL HIGH (ref 0.0–2.0)
Acid-base deficit: 4 mmol/L — ABNORMAL HIGH (ref 0.0–2.0)
Acid-base deficit: 5 mmol/L — ABNORMAL HIGH (ref 0.0–2.0)
Bicarbonate: 20.8 mmol/L (ref 20.0–28.0)
Bicarbonate: 23.7 mmol/L (ref 20.0–28.0)
Bicarbonate: 21.6 mmol/L (ref 20.0–28.0)
Bicarbonate: 21.9 mmol/L (ref 20.0–28.0)
Bicarbonate: 22.8 mmol/L (ref 20.0–28.0)
Bicarbonate: 21.6 mmol/L (ref 20.0–28.0)
Bicarbonate: 22.7 mmol/L (ref 20.0–28.0)
Calcium, Ion: 1.22 mmol/L (ref 1.15–1.40)
Calcium, Ion: 1.3 mmol/L (ref 1.15–1.40)
Calcium, Ion: 1.17 mmol/L (ref 1.15–1.40)
Calcium, Ion: 1.19 mmol/L (ref 1.15–1.40)
Calcium, Ion: 1.2 mmol/L (ref 1.15–1.40)
Calcium, Ion: 1.2 mmol/L (ref 1.15–1.40)
Calcium, Ion: 1.32 mmol/L (ref 1.15–1.40)
HCT: 34 % — ABNORMAL LOW (ref 36.0–46.0)
HCT: 36 % (ref 36.0–46.0)
HCT: 33 % — ABNORMAL LOW (ref 36.0–46.0)
HCT: 34 % — ABNORMAL LOW (ref 36.0–46.0)
HCT: 34 % — ABNORMAL LOW (ref 36.0–46.0)
HCT: 34 % — ABNORMAL LOW (ref 36.0–46.0)
HCT: 35 % — ABNORMAL LOW (ref 36.0–46.0)
Hemoglobin: 11.6 g/dL — ABNORMAL LOW (ref 12.0–15.0)
Hemoglobin: 12.2 g/dL (ref 12.0–15.0)
Hemoglobin: 11.2 g/dL — ABNORMAL LOW (ref 12.0–15.0)
Hemoglobin: 11.6 g/dL — ABNORMAL LOW (ref 12.0–15.0)
Hemoglobin: 11.6 g/dL — ABNORMAL LOW (ref 12.0–15.0)
Hemoglobin: 11.6 g/dL — ABNORMAL LOW (ref 12.0–15.0)
Hemoglobin: 11.9 g/dL — ABNORMAL LOW (ref 12.0–15.0)
O2 Saturation: 95 %
O2 Saturation: 71 %
O2 Saturation: 59 %
O2 Saturation: 61 %
O2 Saturation: 61 %
O2 Saturation: 61 %
O2 Saturation: 72 %
Patient temperature: 37
Potassium: 4.4 mmol/L (ref 3.5–5.1)
Potassium: 3.9 mmol/L (ref 3.5–5.1)
Potassium: 4 mmol/L (ref 3.5–5.1)
Potassium: 4 mmol/L (ref 3.5–5.1)
Potassium: 4.1 mmol/L (ref 3.5–5.1)
Potassium: 3.7 mmol/L (ref 3.5–5.1)
Potassium: 3.9 mmol/L (ref 3.5–5.1)
Sodium: 125 mmol/L — ABNORMAL LOW (ref 135–145)
Sodium: 124 mmol/L — ABNORMAL LOW (ref 135–145)
Sodium: 127 mmol/L — ABNORMAL LOW (ref 135–145)
Sodium: 127 mmol/L — ABNORMAL LOW (ref 135–145)
Sodium: 127 mmol/L — ABNORMAL LOW (ref 135–145)
Sodium: 125 mmol/L — ABNORMAL LOW (ref 135–145)
Sodium: 126 mmol/L — ABNORMAL LOW (ref 135–145)
TCO2: 22 mmol/L (ref 22–32)
TCO2: 25 mmol/L (ref 22–32)
TCO2: 23 mmol/L (ref 22–32)
TCO2: 23 mmol/L (ref 22–32)
TCO2: 24 mmol/L (ref 22–32)
TCO2: 23 mmol/L (ref 22–32)
TCO2: 24 mmol/L (ref 22–32)
pCO2, Ven: 41.3 mmHg — ABNORMAL LOW (ref 44–60)
pCO2, Ven: 45.8 mmHg (ref 44–60)
pCO2, Ven: 49.2 mmHg (ref 44–60)
pCO2, Ven: 49.6 mmHg (ref 44–60)
pCO2, Ven: 50 mmHg (ref 44–60)
pCO2, Ven: 39 mmHg — ABNORMAL LOW (ref 44–60)
pCO2, Ven: 52.2 mmHg (ref 44–60)
pH, Ven: 7.309 (ref 7.25–7.43)
pH, Ven: 7.323 (ref 7.25–7.43)
pH, Ven: 7.248 — ABNORMAL LOW (ref 7.25–7.43)
pH, Ven: 7.256 (ref 7.25–7.43)
pH, Ven: 7.267 (ref 7.25–7.43)
pH, Ven: 7.247 — ABNORMAL LOW (ref 7.25–7.43)
pH, Ven: 7.352 (ref 7.25–7.43)
pO2, Ven: 82 mmHg — ABNORMAL HIGH (ref 32–45)
pO2, Ven: 40 mmHg (ref 32–45)
pO2, Ven: 36 mmHg (ref 32–45)
pO2, Ven: 36 mmHg (ref 32–45)
pO2, Ven: 37 mmHg (ref 32–45)
pO2, Ven: 37 mmHg (ref 32–45)
pO2, Ven: 40 mmHg (ref 32–45)

## 2023-02-02 LAB — BASIC METABOLIC PANEL
Anion gap: 7 (ref 5–15)
Anion gap: 10 (ref 5–15)
BUN: 14 mg/dL (ref 8–23)
BUN: 17 mg/dL (ref 8–23)
CO2: 22 mmol/L (ref 22–32)
CO2: 20 mmol/L — ABNORMAL LOW (ref 22–32)
Calcium: 8.7 mg/dL — ABNORMAL LOW (ref 8.9–10.3)
Calcium: 7.8 mg/dL — ABNORMAL LOW (ref 8.9–10.3)
Chloride: 94 mmol/L — ABNORMAL LOW (ref 98–111)
Chloride: 93 mmol/L — ABNORMAL LOW (ref 98–111)
Creatinine, Ser: 0.73 mg/dL (ref 0.44–1.00)
Creatinine, Ser: 0.78 mg/dL (ref 0.44–1.00)
GFR, Estimated: >60 mL/min (ref >60)
GFR, Estimated: >60 mL/min (ref >60)
Glucose, Bld: 147 mg/dL — ABNORMAL HIGH (ref 70–99)
Glucose, Bld: 161 mg/dL — ABNORMAL HIGH (ref 70–99)
Potassium: 4 mmol/L (ref 3.5–5.1)
Potassium: 3.7 mmol/L (ref 3.5–5.1)
Sodium: 123 mmol/L — ABNORMAL LOW (ref 135–145)
Sodium: 123 mmol/L — ABNORMAL LOW (ref 135–145)

## 2023-02-02 LAB — LACTIC ACID, PLASMA: Lactic Acid, Venous: 1.4 mmol/L (ref 0.5–1.9)

## 2023-02-02 LAB — COMPREHENSIVE METABOLIC PANEL
ALT: 90 U/L — ABNORMAL HIGH (ref 0–44)
AST: 180 U/L — ABNORMAL HIGH (ref 15–41)
Albumin: 3.1 g/dL — ABNORMAL LOW (ref 3.5–5.0)
Alkaline Phosphatase: 39 U/L (ref 38–126)
Anion gap: 11 (ref 5–15)
BUN: 15 mg/dL (ref 8–23)
CO2: 23 mmol/L (ref 22–32)
Calcium: 8.3 mg/dL — ABNORMAL LOW (ref 8.9–10.3)
Chloride: 89 mmol/L — ABNORMAL LOW (ref 98–111)
Creatinine, Ser: 0.67 mg/dL (ref 0.44–1.00)
GFR, Estimated: >60 mL/min (ref >60)
Glucose, Bld: 173 mg/dL — ABNORMAL HIGH (ref 70–99)
Potassium: 3.9 mmol/L (ref 3.5–5.1)
Sodium: 123 mmol/L — ABNORMAL LOW (ref 135–145)
Total Bilirubin: 0.6 mg/dL (ref 0.3–1.2)
Total Protein: 5.4 g/dL — ABNORMAL LOW (ref 6.5–8.1)

## 2023-02-02 LAB — TYPE AND SCREEN
ABO/RH(D): O POS
Antibody Screen: NEGATIVE
Unit division: 0
Unit division: 0
Unit division: 0
Unit division: 0

## 2023-02-02 LAB — BPAM FFP
Blood Product Expiration Date: 202408182359
Blood Product Expiration Date: 202408182359
Unit Type and Rh: 6200
Unit Type and Rh: 6200

## 2023-02-02 LAB — CBC
HCT: 33.9 % — ABNORMAL LOW (ref 36.0–46.0)
HCT: 33.6 % — ABNORMAL LOW (ref 36.0–46.0)
HCT: 34.4 % — ABNORMAL LOW (ref 36.0–46.0)
Hemoglobin: 11.6 g/dL — ABNORMAL LOW (ref 12.0–15.0)
Hemoglobin: 11.5 g/dL — ABNORMAL LOW (ref 12.0–15.0)
Hemoglobin: 11.4 g/dL — ABNORMAL LOW (ref 12.0–15.0)
MCH: 31.8 pg (ref 26.0–34.0)
MCH: 31.9 pg (ref 26.0–34.0)
MCH: 30.9 pg (ref 26.0–34.0)
MCHC: 34.2 g/dL (ref 30.0–36.0)
MCHC: 34.2 g/dL (ref 30.0–36.0)
MCHC: 33.1 g/dL (ref 30.0–36.0)
MCV: 92.9 fL (ref 80.0–100.0)
MCV: 93.1 fL (ref 80.0–100.0)
MCV: 93.2 fL (ref 80.0–100.0)
Platelets: 410 10*3/uL — ABNORMAL HIGH (ref 150–400)
Platelets: 526 10*3/uL — ABNORMAL HIGH (ref 150–400)
Platelets: 378 10*3/uL (ref 150–400)
RBC: 3.65 MIL/uL — ABNORMAL LOW (ref 3.87–5.11)
RBC: 3.61 MIL/uL — ABNORMAL LOW (ref 3.87–5.11)
RBC: 3.69 MIL/uL — ABNORMAL LOW (ref 3.87–5.11)
RDW: 13.2 % (ref 11.5–15.5)
RDW: 13.2 % (ref 11.5–15.5)
RDW: 13.7 % (ref 11.5–15.5)
WBC: 18.2 10*3/uL — ABNORMAL HIGH (ref 4.0–10.5)
WBC: 17.2 10*3/uL — ABNORMAL HIGH (ref 4.0–10.5)
WBC: 14.6 10*3/uL — ABNORMAL HIGH (ref 4.0–10.5)
nRBC: 0 % (ref 0.0–0.2)
nRBC: 0 % (ref 0.0–0.2)
nRBC: 0 % (ref 0.0–0.2)

## 2023-02-02 LAB — COOXEMETRY PANEL
Carboxyhemoglobin: 1.6 % — ABNORMAL HIGH (ref 0.5–1.5)
Carboxyhemoglobin: 1.6 % — ABNORMAL HIGH (ref 0.5–1.5)
Methemoglobin: <0.7 % (ref 0.0–1.5)
Methemoglobin: <0.7 % (ref 0.0–1.5)
O2 Saturation: 54.7 %
O2 Saturation: 68.3 %
Total hemoglobin: 12 g/dL (ref 12.0–16.0)
Total hemoglobin: 11.3 g/dL — ABNORMAL LOW (ref 12.0–16.0)

## 2023-02-02 LAB — PHOSPHORUS: Phosphorus: 3.7 mg/dL (ref 2.5–4.6)

## 2023-02-02 LAB — PREPARE FRESH FROZEN PLASMA
Unit division: 0
Unit division: 0

## 2023-02-02 LAB — GLUCOSE, CAPILLARY
Glucose-Capillary: 168 mg/dL — ABNORMAL HIGH (ref 70–99)
Glucose-Capillary: 147 mg/dL — ABNORMAL HIGH (ref 70–99)
Glucose-Capillary: 126 mg/dL — ABNORMAL HIGH (ref 70–99)
Glucose-Capillary: 173 mg/dL — ABNORMAL HIGH (ref 70–99)
Glucose-Capillary: 123 mg/dL — ABNORMAL HIGH (ref 70–99)

## 2023-02-02 LAB — CG4 I-STAT (LACTIC ACID)
Lactic Acid, Venous: 0.8 mmol/L (ref 0.5–1.9)
Lactic Acid, Venous: 1.5 mmol/L (ref 0.5–1.9)

## 2023-02-02 LAB — CULTURE, RESPIRATORY W GRAM STAIN

## 2023-02-02 LAB — MAGNESIUM
Magnesium: 2.9 mg/dL — ABNORMAL HIGH (ref 1.7–2.4)
Magnesium: 2.7 mg/dL — ABNORMAL HIGH (ref 1.7–2.4)
Magnesium: 2.3 mg/dL (ref 1.7–2.4)

## 2023-02-02 LAB — POCT ACTIVATED CLOTTING TIME
Activated Clotting Time: 898 seconds
Activated Clotting Time: 122 seconds

## 2023-02-02 LAB — PREPARE RBC (CROSSMATCH)

## 2023-02-02 SURGERY — RIGHT HEART CATH
Anesthesia: General

## 2023-02-02 SURGERY — IABP INSERTION
Anesthesia: LOCAL

## 2023-02-02 MED ORDER — IOHEXOL 350 MG/ML SOLN
INTRAVENOUS | Status: DC | PRN
  Administered 2023-02-02: 40 mL

## 2023-02-02 MED ORDER — PHENYLEPHRINE 80 MCG/ML (10ML) SYRINGE FOR IV PUSH (FOR BLOOD PRESSURE SUPPORT)
PREFILLED_SYRINGE | INTRAVENOUS | Status: AC
  Filled 2023-02-02: qty 10

## 2023-02-02 MED ORDER — SODIUM CHLORIDE 0.9% IV SOLUTION: Freq: Once | INTRAVENOUS | Status: DC

## 2023-02-02 MED ORDER — MIDAZOLAM HCL 2 MG/2ML IJ SOLN
INTRAMUSCULAR | Status: AC | PRN
  Administered 2023-02-02: 2 mg via INTRAVENOUS

## 2023-02-02 MED ORDER — LABETALOL HCL 5 MG/ML IV SOLN: 10.0000 mg | INTRAVENOUS | Status: AC | PRN

## 2023-02-02 MED ORDER — NOREPINEPHRINE 4 MG/250ML-% IV SOLN
INTRAVENOUS | Status: AC
  Filled 2023-02-02: qty 250

## 2023-02-02 MED ORDER — MIDAZOLAM HCL 2 MG/2ML IJ SOLN
INTRAMUSCULAR | Status: DC | PRN
  Administered 2023-02-02: 1 mg via INTRAVENOUS

## 2023-02-02 MED ORDER — FENTANYL CITRATE PF 50 MCG/ML IJ SOSY
PREFILLED_SYRINGE | INTRAMUSCULAR | Status: AC
  Filled 2023-02-02: qty 2

## 2023-02-02 MED ORDER — ACETAMINOPHEN 325 MG PO TABS
650.0000 mg | ORAL_TABLET | ORAL | Status: DC | PRN
  Administered 2023-02-03 – 2023-02-12 (×3): 650 mg via ORAL
  Filled 2023-02-02 (×3): qty 2

## 2023-02-02 MED ORDER — FENTANYL 2500MCG IN NS 250ML (10MCG/ML) PREMIX INFUSION
50.0000 ug/h | INTRAVENOUS | Status: DC
  Administered 2023-02-02: 200 ug/h via INTRAVENOUS
  Administered 2023-02-03: 250 ug/h via INTRAVENOUS
  Administered 2023-02-03 – 2023-02-04 (×2): 200 ug/h via INTRAVENOUS
  Administered 2023-02-04: 125 ug/h via INTRAVENOUS
  Administered 2023-02-05: 175 ug/h via INTRAVENOUS
  Administered 2023-02-05: 200 ug/h via INTRAVENOUS
  Administered 2023-02-06: 175 ug/h via INTRAVENOUS
  Administered 2023-02-07: 200 ug/h via INTRAVENOUS
  Filled 2023-02-02 (×9): qty 250

## 2023-02-02 MED ORDER — LIDOCAINE HCL (PF) 1 % IJ SOLN
INTRAMUSCULAR | Status: DC | PRN
  Administered 2023-02-02: 10 mL via INTRADERMAL

## 2023-02-02 MED ORDER — POTASSIUM CHLORIDE 10 MEQ/50ML IV SOLN
10.0000 meq | INTRAVENOUS | Status: AC
  Administered 2023-02-02 (×2): 10 meq via INTRAVENOUS
  Filled 2023-02-02: qty 50

## 2023-02-02 MED ORDER — FENTANYL CITRATE PF 50 MCG/ML IJ SOSY
PREFILLED_SYRINGE | INTRAMUSCULAR | Status: AC | PRN
Start: 2023-02-02 — End: 2023-02-02
  Administered 2023-02-02: 25 ug via INTRAVENOUS

## 2023-02-02 MED ORDER — PHENYLEPHRINE HCL-NACL 20-0.9 MG/250ML-% IV SOLN
0.0000 ug/min | INTRAVENOUS | Status: DC
  Administered 2023-02-02: 175 ug/min via INTRAVENOUS
  Administered 2023-02-02: 150 ug/min via INTRAVENOUS
  Administered 2023-02-02: 200 ug/min via INTRAVENOUS
  Administered 2023-02-02: 70 ug/min via INTRAVENOUS
  Administered 2023-02-02: 115 ug/min via INTRAVENOUS
  Filled 2023-02-02 (×6): qty 250

## 2023-02-02 MED ORDER — LIDOCAINE BOLUS VIA INFUSION
100.0000 mg | Freq: Once | INTRAVENOUS | Status: AC
  Administered 2023-02-02: 100 mg via INTRAVENOUS
  Filled 2023-02-02: qty 100

## 2023-02-02 MED ORDER — FENTANYL CITRATE (PF) 100 MCG/2ML IJ SOLN
INTRAMUSCULAR | Status: DC | PRN
  Administered 2023-02-02: 25 ug via INTRAVENOUS

## 2023-02-02 MED ORDER — SODIUM CHLORIDE 0.9 % IV BOLUS: 250.0000 mL | Freq: Once | INTRAVENOUS | Status: DC

## 2023-02-02 MED ORDER — SODIUM BICARBONATE 8.4 % IV SOLN
INTRAVENOUS | Status: AC
  Filled 2023-02-02: qty 150

## 2023-02-02 MED ORDER — PANTOPRAZOLE SODIUM 40 MG IV SOLR
40.0000 mg | INTRAVENOUS | Status: DC
  Administered 2023-02-02: 40 mg via INTRAVENOUS
  Filled 2023-02-02: qty 10

## 2023-02-02 MED ORDER — HYDRALAZINE HCL 20 MG/ML IJ SOLN: 10.0000 mg | INTRAMUSCULAR | Status: AC | PRN

## 2023-02-02 MED ORDER — MILRINONE LACTATE IN DEXTROSE 20-5 MG/100ML-% IV SOLN
0.3000 ug/kg/min | INTRAVENOUS | Status: DC
  Administered 2023-02-03 – 2023-02-05 (×6): 0.25 ug/kg/min via INTRAVENOUS
  Administered 2023-02-06: 0.3 ug/kg/min via INTRAVENOUS
  Filled 2023-02-02 (×7): qty 100

## 2023-02-02 MED ORDER — SODIUM CHLORIDE 0.9 % IV SOLN: 250.0000 mL | INTRAVENOUS | Status: DC | PRN

## 2023-02-02 MED ORDER — SODIUM CHLORIDE 0.9 % IV SOLN: INTRAVENOUS | Status: DC | PRN

## 2023-02-02 MED ORDER — FUROSEMIDE 10 MG/ML IJ SOLN: 60.0000 mg | Freq: Once | INTRAMUSCULAR | Status: DC

## 2023-02-02 MED ORDER — ORAL CARE MOUTH RINSE
15.0000 mL | OROMUCOSAL | Status: DC
  Administered 2023-02-02 – 2023-02-06 (×42): 15 mL via OROMUCOSAL

## 2023-02-02 MED ORDER — MAGNESIUM SULFATE 50 % IJ SOLN
INTRAMUSCULAR | Status: AC | PRN
  Administered 2023-02-01: 2 g via INTRAVENOUS

## 2023-02-02 MED ORDER — FUROSEMIDE 10 MG/ML IJ SOLN
INTRAMUSCULAR | Status: AC
  Filled 2023-02-02: qty 2

## 2023-02-02 MED ORDER — LIDOCAINE VISCOUS HCL 2 % MT SOLN
15.0000 mL | OROMUCOSAL | Status: DC | PRN
  Filled 2023-02-02: qty 15

## 2023-02-02 MED ORDER — LIDOCAINE HCL (PF) 1 % IJ SOLN
INTRAMUSCULAR | Status: DC | PRN
  Administered 2023-02-02 (×2): 10 mL

## 2023-02-02 MED ORDER — MILRINONE LACTATE IN DEXTROSE 20-5 MG/100ML-% IV SOLN
INTRAVENOUS | Status: DC | PRN
  Administered 2023-02-02: .125 ug/kg/min via INTRAVENOUS

## 2023-02-02 MED ORDER — HEPARIN SODIUM (PORCINE) 1000 UNIT/ML IJ SOLN
INTRAMUSCULAR | Status: AC
  Filled 2023-02-02: qty 10

## 2023-02-02 MED ORDER — CHLORHEXIDINE GLUCONATE CLOTH 2 % EX PADS
6.0000 | MEDICATED_PAD | Freq: Once | CUTANEOUS | Status: DC
  Administered 2023-02-02: 6 via TOPICAL

## 2023-02-02 MED ORDER — PROPOFOL 1000 MG/100ML IV EMUL
INTRAVENOUS | Status: AC
  Administered 2023-02-02: 20 ug/kg/min via INTRAVENOUS
  Filled 2023-02-02: qty 100

## 2023-02-02 MED ORDER — MIDAZOLAM-SODIUM CHLORIDE 100-0.9 MG/100ML-% IV SOLN
0.5000 mg/h | INTRAVENOUS | Status: DC
  Administered 2023-02-02: 4 mg/h via INTRAVENOUS
  Administered 2023-02-02: 10 mg/h via INTRAVENOUS
  Administered 2023-02-03: 8 mg/h via INTRAVENOUS
  Administered 2023-02-03: 10 mg/h via INTRAVENOUS
  Administered 2023-02-04: 4 mg/h via INTRAVENOUS
  Administered 2023-02-05: 5 mg/h via INTRAVENOUS
  Administered 2023-02-06: 3 mg/h via INTRAVENOUS
  Filled 2023-02-02 (×8): qty 100

## 2023-02-02 MED ORDER — ROCURONIUM BROMIDE 10 MG/ML (PF) SYRINGE
PREFILLED_SYRINGE | INTRAVENOUS | Status: DC | PRN
  Administered 2023-02-02 (×2): 100 mg via INTRAVENOUS

## 2023-02-02 MED ORDER — FUROSEMIDE 10 MG/ML IJ SOLN
60.0000 mg | Freq: Once | INTRAMUSCULAR | Status: AC
  Administered 2023-02-02: 60 mg via INTRAVENOUS

## 2023-02-02 MED ORDER — ROCURONIUM BROMIDE 10 MG/ML (PF) SYRINGE
PREFILLED_SYRINGE | INTRAVENOUS | Status: AC
  Filled 2023-02-02: qty 10

## 2023-02-02 MED ORDER — ETOMIDATE 2 MG/ML IV SOLN
INTRAVENOUS | Status: AC
  Filled 2023-02-02: qty 20

## 2023-02-02 MED ORDER — NOREPINEPHRINE 4 MG/250ML-% IV SOLN
INTRAVENOUS | Status: DC | PRN
  Administered 2023-02-02: 3 ug/min via INTRAVENOUS

## 2023-02-02 MED ORDER — MAGNESIUM SULFATE 50 % IJ SOLN
INTRAVENOUS | Status: DC | PRN
  Administered 2023-02-02: 1 g via INTRAVENOUS

## 2023-02-02 MED ORDER — CALCIUM GLUCONATE-NACL 2-0.675 GM/100ML-% IV SOLN
2.0000 g | Freq: Once | INTRAVENOUS | Status: AC
  Administered 2023-02-02: 2000 mg via INTRAVENOUS
  Filled 2023-02-02: qty 100

## 2023-02-02 MED ORDER — PHENYLEPHRINE HCL-NACL 20-0.9 MG/250ML-% IV SOLN
INTRAVENOUS | Status: AC
  Administered 2023-02-02: 10 ug/min via INTRAVENOUS
  Filled 2023-02-02: qty 250

## 2023-02-02 MED ORDER — SODIUM CHLORIDE 0.9 % IV SOLN
INTRAVENOUS | Status: DC | PRN
Start: 2023-02-02 — End: 2023-02-02

## 2023-02-02 MED ORDER — ROCURONIUM BROMIDE 50 MG/5ML IV SOLN
INTRAVENOUS | Status: AC | PRN
Start: 2023-02-02 — End: 2023-02-02
  Administered 2023-02-02: 50 mg via INTRAVENOUS

## 2023-02-02 MED ORDER — FUROSEMIDE 10 MG/ML IJ SOLN
INTRAMUSCULAR | Status: AC
  Filled 2023-02-02: qty 4

## 2023-02-02 MED ORDER — ONDANSETRON HCL 4 MG/2ML IJ SOLN: 4.0000 mg | Freq: Four times a day (QID) | INTRAMUSCULAR | Status: DC | PRN

## 2023-02-02 MED ORDER — SODIUM CHLORIDE 0.9% FLUSH: 3.0000 mL | INTRAVENOUS | Status: DC | PRN

## 2023-02-02 MED ORDER — MIDAZOLAM HCL 2 MG/2ML IJ SOLN
INTRAMUSCULAR | Status: AC
  Filled 2023-02-02: qty 2

## 2023-02-02 MED ORDER — SODIUM CHLORIDE 0.9% FLUSH
3.0000 mL | Freq: Two times a day (BID) | INTRAVENOUS | Status: DC
  Administered 2023-02-02 – 2023-02-04 (×2): 3 mL via INTRAVENOUS

## 2023-02-02 MED ORDER — LIDOCAINE HCL (PF) 1 % IJ SOLN
INTRAMUSCULAR | Status: DC | PRN
  Administered 2023-02-02: 2 mL via INTRADERMAL
  Administered 2023-02-02: 15 mL via INTRADERMAL

## 2023-02-02 MED ORDER — PHENYLEPHRINE 80 MCG/ML (10ML) SYRINGE FOR IV PUSH (FOR BLOOD PRESSURE SUPPORT)
PREFILLED_SYRINGE | INTRAVENOUS | Status: AC | PRN
Start: 2023-02-02 — End: 2023-02-02
  Administered 2023-02-02: 24 ug via INTRAVENOUS

## 2023-02-02 MED ORDER — FENTANYL CITRATE (PF) 100 MCG/2ML IJ SOLN: 50.0000 ug | Freq: Once | INTRAMUSCULAR | Status: DC

## 2023-02-02 MED ORDER — HEPARIN SODIUM (PORCINE) 1000 UNIT/ML IJ SOLN
INTRAMUSCULAR | Status: DC | PRN
  Administered 2023-02-02: 4000 [IU] via INTRAVENOUS

## 2023-02-02 MED ORDER — NOREPINEPHRINE 4 MG/250ML-% IV SOLN
0.0000 ug/min | INTRAVENOUS | Status: DC
  Administered 2023-02-02: 25 ug/min via INTRAVENOUS
  Administered 2023-02-03: 6 ug/min via INTRAVENOUS
  Filled 2023-02-02: qty 250

## 2023-02-02 MED ORDER — HEPARIN SODIUM (PORCINE) 5000 UNIT/ML IJ SOLN: 5000.0000 [IU] | Freq: Three times a day (TID) | INTRAMUSCULAR | Status: DC

## 2023-02-02 MED ORDER — VERAPAMIL HCL 2.5 MG/ML IV SOLN
INTRAVENOUS | Status: DC | PRN
  Administered 2023-02-02: 5 mL via INTRA_ARTERIAL

## 2023-02-02 MED ORDER — LIDOCAINE HCL (CARDIAC) PF 100 MG/5ML IV SOSY
PREFILLED_SYRINGE | INTRAVENOUS | Status: AC | PRN
  Administered 2023-02-02: 100 mg via INTRAVENOUS

## 2023-02-02 MED ORDER — HEPARIN (PORCINE) 25000 UT/250ML-% IV SOLN
1200.0000 [IU]/h | INTRAVENOUS | Status: DC
  Administered 2023-02-02: 1000 [IU]/h via INTRAVENOUS
  Administered 2023-02-03: 1100 [IU]/h via INTRAVENOUS
  Administered 2023-02-04: 1200 [IU]/h via INTRAVENOUS
  Administered 2023-02-04: 800 [IU]/h via INTRAVENOUS
  Administered 2023-02-05: 1100 [IU]/h via INTRAVENOUS
  Filled 2023-02-02 (×5): qty 250

## 2023-02-02 MED ORDER — HEPARIN (PORCINE) IN NACL 1000-0.9 UT/500ML-% IV SOLN
INTRAVENOUS | Status: DC | PRN
  Administered 2023-02-02: 500 mL

## 2023-02-02 MED ORDER — LIDOCAINE HCL (PF) 1 % IJ SOLN
INTRAMUSCULAR | Status: AC
  Filled 2023-02-02: qty 30

## 2023-02-02 MED ORDER — FENTANYL BOLUS VIA INFUSION
50.0000 ug | INTRAVENOUS | Status: DC | PRN
  Administered 2023-02-02 – 2023-02-05 (×2): 50 ug via INTRAVENOUS
  Administered 2023-02-06: 100 ug via INTRAVENOUS

## 2023-02-02 MED ORDER — FUROSEMIDE 10 MG/ML IJ SOLN
INTRAMUSCULAR | Status: DC | PRN
  Administered 2023-02-02 (×2): 20 mg via INTRAVENOUS

## 2023-02-02 MED ORDER — PROPOFOL 1000 MG/100ML IV EMUL
0.0000 ug/kg/min | INTRAVENOUS | Status: DC
  Administered 2023-02-02: 30 ug/kg/min via INTRAVENOUS
  Administered 2023-02-02: 25 ug/kg/min via INTRAVENOUS
  Filled 2023-02-02 (×2): qty 100

## 2023-02-02 MED ORDER — ALBUMIN HUMAN 5 % IV SOLN
INTRAVENOUS | Status: AC
  Filled 2023-02-02: qty 500

## 2023-02-02 MED ORDER — FUROSEMIDE 10 MG/ML IJ SOLN
60.0000 mg | Freq: Once | INTRAMUSCULAR | Status: AC
  Administered 2023-02-02: 60 mg via INTRAVENOUS
  Filled 2023-02-02: qty 6

## 2023-02-02 MED ORDER — FENTANYL 2500MCG IN NS 250ML (10MCG/ML) PREMIX INFUSION
INTRAVENOUS | Status: AC
  Administered 2023-02-02: 50 ug/h
  Filled 2023-02-02: qty 250

## 2023-02-02 MED ORDER — ETOMIDATE 2 MG/ML IV SOLN
INTRAVENOUS | Status: AC | PRN
Start: 2023-02-02 — End: 2023-02-02
  Administered 2023-02-02: 10 mg via INTRAVENOUS
  Administered 2023-02-02: 20 mg via INTRAVENOUS

## 2023-02-02 MED ORDER — VERAPAMIL HCL 2.5 MG/ML IV SOLN
INTRAVENOUS | Status: AC
  Filled 2023-02-02: qty 2

## 2023-02-02 SURGICAL SUPPLY — 11 items
BALLN IABP SENSA PLUS 8F 50CC (BALLOONS) ×1
BALLOON IABP SENS PLUS 8F 50CC (BALLOONS) IMPLANT
GLIDESHEATH SLEND SS 6F .021 (SHEATH) IMPLANT
GUIDEWIRE INQWIRE 1.5J.035X260 (WIRE) IMPLANT
INQWIRE 1.5J .035X260CM (WIRE)
KIT ENCORE 26 ADVANTAGE (KITS) IMPLANT
KIT HEART LEFT (KITS) ×1 IMPLANT
PACK CARDIAC CATHETERIZATION (CUSTOM PROCEDURE TRAY) ×1 IMPLANT
SHEATH PROBE COVER 6X72 (BAG) IMPLANT
TRANSDUCER W/STOPCOCK (MISCELLANEOUS) ×1 IMPLANT
WIRE MICRO SET SILHO 5FR 7 (SHEATH) IMPLANT

## 2023-02-02 SURGICAL SUPPLY — 10 items
CABLE ADAPT PACING TEMP 12FT (ADAPTER) IMPLANT
CATH S G BIP PACING (CATHETERS) IMPLANT
KIT CV 3L 7FR 20CM SULFAFREE (SET/KITS/TRAYS/PACK) IMPLANT
KIT MICROPUNCTURE NIT STIFF (SHEATH) IMPLANT
PACK CARDIAC CATHETERIZATION (CUSTOM PROCEDURE TRAY) IMPLANT
SHEATH PINNACLE 4F 10CM (SHEATH) IMPLANT
SHEATH PINNACLE 7F 10CM (SHEATH) IMPLANT
SHEATH PROBE COVER 6X72 (BAG) IMPLANT
SHEATH SUPER FLEX 6FR 11 (SHEATH) IMPLANT
WIRE AMPLATZ SS-J .035X260CM (WIRE) IMPLANT

## 2023-02-02 NOTE — Progress Notes (Signed)
eLink Physician-Brief Progress Note Patient Name: Madison West DOB: October 16, 1955 MRN: [id number]   Date of Service  02/02/2023  HPI/Events of Note  Patient bit tongue during arrest. Has pain  eICU Interventions  Viscous lidocaine q3h PRN ordered     Intervention Category Minor Interventions: Other:   Mechele Collin 02/02/2023, 2:20 AM

## 2023-02-02 NOTE — Progress Notes (Signed)
Cross covering ICU physician.   Called again to bedside for vt arrest.  Pt now in polymorphic vt. Cardiology notified to request ep vs hf/ecmo support. Pt was emergently intubated with frequent cardioversions required, bolused another 100 lidocaine, given 2 more mg mag and 1 gm calcium chloride with me at bedside. Lido infusion increased to 2mg .   Cardiology arrived at bedside, reaching out to hf for [facility] ecmo support.   R radial artery with tr band from recent cath so I emergently placed L radial a line.  Family updated to best of my ability at bedside as well.

## 2023-02-02 NOTE — Progress Notes (Signed)
NAME:  Clella Mccosh, MRN:  [id number], DOB:  29-Jan-1956, LOS: 9 ADMISSION DATE:  01/23/2023, CONSULTATION DATE:  02/02/2023  REFERRING MD:  TRH, pokhrel, CHIEF COMPLAINT:  VT arrest   History of Present Illness:  67 yo F w/ pertinent PMH PSVT, HTN, HLD, R carotid artery stenosis s/p endarterectomy 2020, CAD s/p stenting 2021, moderate aortic stenosis presents to [facility] ED on 8/9 w/ SOB.   Patient has been having SOB that has been getting progressively worse. Patient states she has been gaining weight and feels fatigued. Patient also having some anginal chest pain similar to when she needed coronary interventions back in 2021. Patient admitted to [facility] on 8/9 and treated for new onset CHF. Echo showing LVEF 30-35%; regional wall motion abnormalities; moderate aortic stenosis. BNP 462. EKG showing PVC, LAE, LAFB w/ miniaml ST elevation. Patient underwent cardiac cath on 8.7 showing mid LAD 95% lesion w/ LAD stent placement. CT surgery consulted for severe aortic stenosis. Patient has been having episodes of non sustained v tach started on metoprolol.    On 8/11, patient on floors and had vtach w/ no pulse. CPR initiated, given epi, and shocked x2 with ROSC achieved. Patient alert after cpr event but slightly confused. Patient again went into Vtach rhythm and was shocked again. Given amio, mag, calcium. Patient started on amio drip. Patient transferred to [facility] ICU and cardiology consulted. EKG showing ST elevation in anterior leads. Code STEMI. PCCM consulted.  Pertinent  Medical History  PSVT, anxiety/depression,  breast cancer status post lumpectomy, chemo and radiation in 2009, hypertension,  hyperlipidemia,  right carotid artery stenosis status post carotid endarterectomy in 2020, CAD status post stent to LAD 8/7  Significant Hospital Events: Including procedures, antibiotic start and stop dates in addition to other pertinent events   01/31/2023 for left heart catheterization intervention on the  mid LAD. 8/13 overnight patient developed refractory VT shocked multiple times taken back to Cath Lab, intra-aortic balloon pump placed.  Stabilized intolerant of inotropes due to recurrent VT on amiodarone lidocaine and Neo-Synephrine.  Interim History / Subjective:   Critically ill intubated on mechanical life support  Objective   Blood pressure 139/88, pulse 84, temperature (!) 97.3 F (36.3 C), resp. rate 18, height 5\' 8"  (1.727 m), weight 93.1 kg, SpO2 100%. PAP: (36-64)/(6-50) 51/26 CVP:  [9 mmHg-32 mmHg] 18 mmHg CO:  [3.1 L/min] 3.1 L/min CI:  [1.5 L/min/m2] 1.5 L/min/m2  Vent Mode: PRVC FiO2 (%):  [100 %] 100 % Set Rate:  [18 bmp] 18 bmp Vt Set:  [550 mL] 550 mL PEEP:  [5 cmH20] 5 cmH20 Plateau Pressure:  [18 cmH20] 18 cmH20   Intake/Output Summary (Last 24 hours) at 02/02/2023 0802 Last data filed at 02/02/2023 0700 Gross per 24 hour  Intake 764.19 ml  Output 860 ml  Net -95.81 ml   Filed Weights   01/30/23 0534 01/31/23 0430 02/01/23 0500  Weight: 93.8 kg 93.3 kg 93.1 kg    Examination: Gen. elderly female critically ill intubated on mechanical life support ENT NCAT tracking Neck: No JVD, right cordis with swan Lungs: Bilateral mechanically ventilated breath sounds Cardiovascular: Regular rate and rhythm, S1-S2 Abdomen: Soft, nontender nondistended Musculoskeletal: No significant edema Neuro: Alert oriented following commands no focal deficit  Presenting: EKG shows ST elevations V1-V3 Rhythm on monitor initially showed runs of VT but then settled down into sinus tachycardia, bigeminy rhythm  Labs show mild leukocytosis, stable hemoglobin  Resolved Hospital Problem list     Assessment &  Plan:   Refractory VT, cardiogenic shock VT arrest in the setting of recent LAD stent, EKG showing anterior wall STEMI, likely in-stent restenosis Acute coronary syndrome, taken to the Cath Lab with successful percutaneous intervention on the mid LAD Plan: Remains on  amiodarone and lidocaine Intra-aortic balloon pump Pads in place for recurrent cardioversion if needed Consult placed to advanced heart failure team and ECMO team. Ongoing discussions about whether or not she would be a candidate. The concern at this point is whether or not if we were to place her on [place] ECMO where with the bridge, PE. Previous there was concerns for medical noncompliance on not wanting to take her antiplatelet medications or statins. Even if she was placed on [place] ECMO she would likely need valve replacement and if her LV does not recover band placements.  Before any of these considerations need to have a long discussion with patient's family regarding outcomes.  We appreciate Dr. Gala Romney speaking with the family   Chest wall pain, s/p CPR  P: PRN Oxy  Acute systolic heart failure -EF 30-35% with akinetic apex and septum on echocardiogram   Severe AS -Plan was for TAVR as outpatient  Acute hypoxic respiratory failure, resolved   Hypertension -on losartan and amlodipine Severe anxiety -on BuSpar and Xanax  Hyperglycemia  P: Recheck hyperglycemia    Best Practice (right click and "Reselect all SmartList Selections" daily)   Diet/type: NPO DVT prophylaxis: systemic heparin GI prophylaxis: N/A Lines: N/A Foley:  N/A Code Status:  full code Last date of multidisciplinary goals of care discussion [NA] daughter updated by code team  Labs   CBC: Recent Labs  Lab 01/30/23 0322 01/31/23 2010 02/01/23 0529 02/02/23 0252 02/02/23 0353 02/02/23 0603 02/02/23 0626  WBC 6.9 13.7* 12.8* 18.2*  --  17.2*  --   HGB 13.5 14.1 13.0 11.6* 11.6* 11.5* 11.6*  HCT 40.3 46.0 38.5 33.9* 34.0* 33.6* 34.0*  MCV 97.1 102.7* 96.5 92.9  --  93.1  --   PLT 405* 433* 442* 410*  --  526*  --     Basic Metabolic Panel: Recent Labs  Lab 01/30/23 0322 01/31/23 0645 01/31/23 2010 02/01/23 0529 02/01/23 2053 02/02/23 0252 02/02/23 0353 02/02/23 0603 02/02/23 0626  NA  132*  --  130* 130*  --  123* 122* 123* 124*  K 4.8  --  5.5* 4.2 4.6 3.9 3.4* 4.0 4.2  CL 100  --  95* 98  --  89*  --  94*  --   CO2 24  --  19* 23  --  23  --  22  --   GLUCOSE 109*  --  178* 151*  --  173*  --  147*  --   BUN 22  --  23 18  --  15  --  14  --   CREATININE 0.70 0.73 0.98 0.68  --  0.67  --  0.73  --   CALCIUM 8.8*  --  8.9 8.8*  --  8.3*  --  8.7*  --   MG 2.2  --  2.6* 2.2 1.8 2.9*  --  2.7*  --   PHOS  --   --   --   --   --   --   --  3.7  --    GFR: Estimated Creatinine Clearance: 79.2 mL/min (by C-G formula based on SCr of 0.73 mg/dL). Recent Labs  Lab 01/31/23 2010 02/01/23 0104 02/01/23 0340 02/01/23 0529 02/02/23 0252  02/02/23 0559 02/02/23 0603  WBC 13.7*  --   --  12.8* 18.2*  --  17.2*  LATICACIDVEN  --  2.3* 1.4 1.3  --  1.4  --     Liver Function Tests: Recent Labs  Lab 02/02/23 0252  AST 180*  ALT 90*  ALKPHOS 39  BILITOT 0.6  PROT 5.4*  ALBUMIN 3.1*   No results for input(s): "LIPASE", "AMYLASE" in the last 168 hours. No results for input(s): "AMMONIA" in the last 168 hours.  ABG    Component Value Date/Time   PHART 7.363 02/02/2023 0626   PCO2ART 39.1 02/02/2023 0626   PO2ART 189 (H) 02/02/2023 0626   HCO3 22.4 02/02/2023 0626   TCO2 24 02/02/2023 0626   ACIDBASEDEF 3.0 (H) 02/02/2023 0626   O2SAT 100 02/02/2023 0626     Coagulation Profile: No results for input(s): "INR", "PROTIME" in the last 168 hours.  Cardiac Enzymes: No results for input(s): "CKTOTAL", "CKMB", "CKMBINDEX", "TROPONINI" in the last 168 hours.  HbA1C: Hgb A1c MFr Bld  Date/Time Value Ref Range Status  02/01/2023 05:29 AM 5.6 4.8 - 5.6 % Final    Comment:    (NOTE)         Prediabetes: 5.7 - 6.4         Diabetes: >6.4         Glycemic control for adults with diabetes: <7.0   10/15/2017 04:24 PM 5.8 (H) 4.8 - 5.6 % Final    Comment:             Prediabetes: 5.7 - 6.4          Diabetes: >6.4          Glycemic control for adults with diabetes:  <7.0     CBG: Recent Labs  Lab 02/01/23 1116 02/01/23 1533 02/01/23 1937 02/02/23 0128 02/02/23 0752  GLUCAP 134* 112* 140* 168* 147*    This patient is critically ill with multiple organ system failure; which, requires frequent high complexity decision making, assessment, support, evaluation, and titration of therapies. This was completed through the application of advanced monitoring technologies and extensive interpretation of multiple databases. During this encounter critical care time was devoted to patient care services described in this note for 32 minutes.   Josephine Igo, DO [facility] 02/02/2023 8:02 AM

## 2023-02-02 NOTE — Progress Notes (Signed)
Pt transported to CL with CL supervisor Thereasa Distance and primary RN Cheree Ditto per Dr. Rosemary Holms for emergent IABP.

## 2023-02-02 NOTE — Consult Note (Addendum)
[facility]   MULTIDISCIPLINARY HEART VALVE TEAM  Cardiology Consultation:   Patient ID: Madison West MRN: [id number]; DOB: Feb 28, 1956  Admit date: 01/23/2023 Date of Consult: 02/02/2023  Primary Care Provider: [name], MD [facility] Cardiologist: Dr. Jacinto Halim / Patwatdhan  [facility] Electrophysiologist:  None    Patient Profile:   Madison West is a 67 y.o. female with a hx of CAD s/p PCI to the RCA 02/2020, PSVT terminated with adenosine, breast cancer s/p chemo/XRT in 2009, HTN, HLD, s/p R CEA 2020, obesity, former smoker, extreme anxiety with multiple medication intolerances/phobias, and aortic stenosis who is being seen today for the evaluation of severe LFLG AS in the setting of recurrent VT arrest and cardiogenic shock at the request of Dr. Gala Romney.   History of Present Illness:   Ms. Emmick presented to [facility] on 01/27/23 with worsening shortness of breath, weight gain and fatigue as well as chest pain. She was found to have acute on chronic systolic CHF. Echo showed new LV dysfunction with an EF 30-35% and severe LFLG AS with a mean grad 20 mmHg, peak grad 33.4 mmHg, AVA 0.93 cm2, DVI 0.33, SVI 25. She was diuresed with IV lasix. [facility] 01/27/23 showed 95% mid LAD stenosis with mild calcification and moderate tortuosity (New since 2021), 40% downstream lesion in mid LAD (New since 2021). Otherwise mild diffuse disease. Mid diffuse 50% Lcx disease (previously deemed 80% in 2021), patent ramus stent. Mid eccentric 40% RCA disease.  She was seen by Dr. Leafy Ro with CT surgery who felt she was too high risk for surgery and though PCI + TAVR was a better option. Cardiac gated CTA of the heart revealed anatomical characteristics consistent with aortic stenosis suitable for treatment by transcatheter aortic valve replacement without any significant complicating features and CTA of the aorta and iliac vessels demonstrated what appear to be adequate pelvic  vascular access to facilitate a transfemoral approach. Plans were made for inpatient PCI to LAD followed by outpatient TAVR.   She underwent successful PCI/DES to LAD on 01/29/23 and started on aspirin and Plavix. She was started on metoprolol with plans to discharge home the following day. On 08/11 she developed pulseless VT. CPR started, shocked X 2 and given epi. ROSC achieved. Had recurrent VT s/p shock and was started on amiodarone gtt. Code STEMI activated d/t anterior ST elevations. Repeat coronary angiography showed acute LAD stent thrombosis treated with aspiration thrombectomy and IVUS guided balloon dilatation. Plavix switched to Brillinta. Repeat echo showed EF down to 20-25%. She then developed recurrent episodes of pulseless VT on 01/31/22 for which she required multiple shocks and mechanical ventilation. Post intubation she was started on neosynephrine for hypotension. Taken back to the cath lab. LAD stent patent. D/t recurrent VT with hemodynamic instability + cardiogenic shock IABP was placed early this am. Currently on lidocaine gtt, amiodarone gtt, and neo-synephrine.   Structural heart consulted to consider ECMO + TAVR as her only option.     Past Medical History:  Diagnosis Date   Anxiety    Arthritis    low back and hip pain intermittent   Breast cancer (HCC) 06/02/07   r breast -surgery ,radiaology. chemotherapy   Carpal tunnel syndrome    right hand   Colon polyps    Complication of anesthesia    Fentanyl, Versed-makes extra hyper, bradycardia x 1 in PACU, [facility] (08/15/11 cardiology felt neostigmine may have resulted in AV nodal block)    Coronary artery disease  Depression    denies   Dysplasia of vulva    Hypertension    Palpitations    PSVT, s/p adenosine 08/04/16   S/P breast lumpectomy 07/04/07   R breast   S/P radiation therapy 2009    Past Surgical History:  Procedure Laterality Date   APPENDECTOMY     age 62   CESAREAN SECTION     x 2   COLONOSCOPY W/  POLYPECTOMY     COLONOSCOPY WITH PROPOFOL N/A 04/27/2016   Procedure: COLONOSCOPY WITH PROPOFOL;  Surgeon: Charolett Bumpers, MD;  Location: [facility];  Service: Endoscopy;  Laterality: N/A;   CORONARY STENT INTERVENTION N/A 01/29/2023   Procedure: CORONARY STENT INTERVENTION;  Surgeon: Elder Negus, MD;  Location: [facility] INVASIVE CV LAB;  Service: Cardiovascular;  Laterality: N/A;   CORONARY THROMBECTOMY N/A 01/31/2023   Procedure: Coronary Thrombectomy;  Surgeon: Elder Negus, MD;  Location: [facility];  Service: Cardiovascular;  Laterality: N/A;   CORONARY ULTRASOUND/IVUS N/A 01/31/2023   Procedure: Coronary Ultrasound/IVUS;  Surgeon: Elder Negus, MD;  Location: [facility] LAB;  Service: Cardiovascular;  Laterality: N/A;   CORONARY/GRAFT ACUTE MI REVASCULARIZATION N/A 01/31/2023   Procedure: Coronary/Graft Acute MI Revascularization;  Surgeon: Elder Negus, MD;  Location: [facility];  Service: Cardiovascular;  Laterality: N/A;   DILATION AND CURETTAGE OF UTERUS     multiple   ENDARTERECTOMY Right 06/09/2019   ENDARTERECTOMY Right 06/09/2019   Procedure: ENDARTERECTOMY CAROTID RIGHT;  Surgeon: Sherren Kerns, MD;  Location: [facility] OR;  Service: Vascular;  Laterality: Right;   IABP INSERTION N/A 02/02/2023   Procedure: IABP Insertion;  Surgeon: Elder Negus, MD;  [facility];  Service: Cardiovascular;  Laterality: N/A;   INTRAUTERINE DEVICE INSERTION     IUD REMOVAL     LEFT HEART CATH AND CORONARY ANGIOGRAPHY N/A 02/27/2020   Procedure: LEFT HEART CATH AND CORONARY ANGIOGRAPHY;  Surgeon: Yates Decamp, MD;  [facility]: [facility];  Service: Cardiovascular;  Laterality: N/A;   LEFT HEART CATH AND CORONARY ANGIOGRAPHY N/A 01/31/2023   Procedure: LEFT HEART CATH AND CORONARY ANGIOGRAPHY;  Surgeon: Elder Negus, MD;  [facility]: [facility];  Service: Cardiovascular;  Laterality: N/A;   MASTECTOMY PARTIAL /  LUMPECTOMY W/ AXILLARY LYMPHADENECTOMY Right    lumpectomy and lymph nodes removed   PATCH ANGIOPLASTY Right 06/09/2019   Procedure: Patch Angioplasty of right carotid artery using hemashield paltinum finesse patch;  Surgeon: Sherren Kerns, MD;  Location: [facility] OR;  Service: Vascular;  Laterality: Right;   RIGHT AND LEFT HEART CATH N/A 02/01/2023   Procedure: RIGHT AND LEFT HEART CATH;  Surgeon: Elder Negus, MD;  [facility];  Service: Cardiovascular;  Laterality: N/A;   RIGHT/LEFT HEART CATH AND CORONARY ANGIOGRAPHY N/A 01/27/2023   Procedure: RIGHT/LEFT HEART CATH AND CORONARY ANGIOGRAPHY;  Surgeon: Elder Negus, MD;  Location: [facility];  Service: Cardiovascular;  Laterality: N/A;   TUBAL LIGATION     VULVA SURGERY     Multiple times for dysplasia     Home Medications:  Prior to Admission medications   Medication Sig Start Date End Date Taking? Authorizing Provider  acetaminophen (TYLENOL) 500 MG tablet Take 1,000 mg by mouth every 6 (six) hours as needed for fever or mild pain.   Yes [provider]  alprazolam Prudy Feeler) 2 MG tablet Take 0.25-2 mg by mouth See admin instructions. 1 mg (may  take up to 2 mg) twice daily. May also take 0.25 mg during the day if needed for anxiety.   Yes [provider]  Ascorbic Acid (VITAMIN C PO) Take 2 tablets by mouth daily.   Yes [provider]  aspirin 81 MG chewable tablet Chew 81 mg by mouth daily.    Yes [provider]  Cholecalciferol (VITAMIN D3 PO) Take 1 capsule by mouth daily.   Yes [provider]  Coenzyme Q10 (CO Q 10 PO) Take 1 capsule by mouth daily.   Yes [provider]  GARLIC PO Take 1 capsule by mouth daily.   Yes [provider]  losartan-hydrochlorothiazide (HYZAAR) 50-12.5 MG tablet Take 1 tablet by mouth daily. Patient taking differently: Take 0.75 tablets by mouth daily. 09/30/22  Yes Yates Decamp, MD  Multiple Vitamins-Minerals  (ZINC PO) Take 1 tablet by mouth daily.   Yes [provider]  nitroGLYCERIN (NITROSTAT) 0.4 MG SL tablet Place 1 tablet (0.4 mg total) under the tongue every 5 (five) minutes as needed for chest pain. 08/04/22  Yes Yates Decamp, MD  Omega-3 Fatty Acids (FISH OIL PO) Take 1 capsule by mouth daily.   Yes [provider]  TURMERIC CURCUMIN PO Take 1 tablet by mouth daily.   Yes [provider]    Inpatient Medications: Scheduled Meds:  sodium chloride   Intravenous Once   ALPRAZolam  1 mg Oral Q6H   aspirin  81 mg Oral Daily   busPIRone  5 mg Oral BID   Chlorhexidine Gluconate Cloth  6 each Topical Daily   Chlorhexidine Gluconate Cloth  6 each Topical Once   etomidate       fentaNYL       fentaNYL (SUBLIMAZE) injection  50 mcg Intravenous Once   lidocaine  1 patch Transdermal Q24H   midazolam       omega-3 acid ethyl esters  1 g Oral Daily   mouth rinse  15 mL Mouth Rinse Q2H   pantoprazole  40 mg Oral Daily   phenylephrine       potassium chloride  20 mEq Oral Daily   rocuronium bromide       rosuvastatin  20 mg Oral Daily   sodium chloride flush  3 mL Intravenous Q12H   sodium chloride flush  3 mL Intravenous Q12H   sodium chloride flush  3 mL Intravenous Q12H   ticagrelor  90 mg Oral BID   Continuous Infusions:  sodium chloride     sodium chloride Stopped (02/01/23 1549)   sodium chloride     sodium chloride     sodium chloride     amiodarone 60 mg/hr (02/02/23 1200)   fentaNYL infusion INTRAVENOUS 125 mcg/hr (02/02/23 1200)   heparin Stopped (02/02/23 0653)   lidocaine 2 mg/min (02/02/23 1200)   midazolam 6 mg/hr (02/02/23 1200)   norepinephrine     norepinephrine (LEVOPHED) Adult infusion Stopped (02/02/23 0723)   phenylephrine (NEO-SYNEPHRINE) Adult infusion 115 mcg/min (02/02/23 1243)   sodium chloride     PRN Meds: sodium chloride, sodium chloride, sodium chloride, Place/Maintain arterial line **AND** sodium chloride, acetaminophen, alum &  mag hydroxide-simeth, calcium carbonate, etomidate, fentaNYL, fentaNYL, lidocaine, midazolam, morphine injection, nitroGLYCERIN, norepinephrine, ondansetron (ZOFRAN) IV, oxyCODONE, phenylephrine, rocuronium bromide, sodium chloride flush, sodium chloride flush, sodium chloride flush  Allergies:    Allergies  Allergen Reactions   Prednisone Palpitations   Fentanyl Swelling and Other (See Comments)    Makes pt "hyper" feels she is "climbing  the walls."  Last time she had a colonoscopy with Fentanyl and Versed she was awake the whole time.   Versed [Midazolam] Swelling and Anxiety    Makes pt "hyper" feels she is "climbing the walls."  Last time she had a colonoscopy with Fentanyl and Versed she was awake the whole time.   Vicodin [Hydrocodone-Acetaminophen] Anxiety    Extreme anxiety.  OK to take oxycodone.    Social History:   Social History   Socioeconomic History   Marital status: Widowed    Spouse name: Not on file   Number of children: 2   Years of education: Not on file   Highest education level: Not on file  Occupational History   Occupation: server    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  Tobacco Use   Smoking status: Former    Current packs/day: 0.00    Average packs/day: 0.8 packs/day for 20.0 years (15.0 ttl pk-yrs)    Types: Cigarettes    Start date: 05/06/1977    Quit date: 05/06/1997    Years since quitting: 25.7   Smokeless tobacco: Never  Vaping Use   Vaping status: Never Used  Substance and Sexual Activity   Alcohol use: Yes    Alcohol/week: 0.0 standard drinks of alcohol    Comment: occ   Drug use: No   Sexual activity: Not Currently  Other Topics Concern   Not on file  Social History Narrative   Not on file   Social Determinants of Health   Financial Resource Strain: Not on file  Food Insecurity: No Food Insecurity (01/24/2023)   Hunger Vital Sign    Worried About Running Out of Food in the Last Year: Never true    Ran Out of Food in the Last Year: Never  true  Transportation Needs: No Transportation Needs (01/24/2023)   PRAPARE - Administrator, Civil Service (Medical): No    Lack of Transportation (Non-Medical): No  Physical Activity: Not on file  Stress: Not on file  Social Connections: Not on file  Intimate Partner Violence: Not At Risk (01/24/2023)   Humiliation, Afraid, Rape, and Kick questionnaire    Fear of Current or Ex-Partner: No    Emotionally Abused: No    Physically Abused: No    Sexually Abused: No    Family History:   Family History  Problem Relation Age of Onset   Colon cancer Mother 2   Heart disease Father        heart attack   Colon polyps Brother        one brother had large colon polyp that needed surgery to be removed   Heart disease Paternal Grandfather    Diabetes type II Brother      ROS:  Please see the history of present illness.  All other ROS reviewed and negative.     Physical Exam/Data:   Vitals:   02/02/23 1115 02/02/23 1130 02/02/23 1145 02/02/23 1200  BP:    113/73  Pulse: 61 62 60 60  Resp: 18 18 18 18   Temp: 98.6 F (37 C) 98.8 F (37.1 C) 99 F (37.2 C) 99.1 F (37.3 C)  TempSrc:    Core  SpO2: 98% 95% 97% 98%  Weight:      Height:        Intake/Output Summary (Last 24 hours) at 02/02/2023 1254 Last data filed at 02/02/2023 1200 Gross per 24 hour  Intake 1693.09 ml  Output 1080 ml  Net 613.09 ml  02/02/2023    7:00 AM 02/01/2023    5:00 AM 01/31/2023    4:30 AM  Last 3 Weights  Weight (lbs) 211 lb 10.3 oz 205 lb 4 oz 205 lb 9.6 oz  Weight (kg) 96 kg 93.1 kg 93.26 kg     Body mass index is 32.18 kg/m.  General: intubated and sedated. HEENT: intubated Lymph: no adenopathy Neck: no JVD, R internal jugular swan. Cardiac:  normal S1, S2; RRR; 3/6 harsh SEM Lungs: bilateral mechanically ventilated breath sounds  Abd: soft, nontender, no hepatomegaly  Ext: no edema Musculoskeletal:  No deformities, BUE and BLE strength normal and equal Skin: warm and dry   Neuro:  sedated on vent  Telemetry:  Telemetry was personally reviewed and demonstrates: sinus with frequent ventricular ectopy and periods of bigeminy. Previous runs of VT.   Cardiac Studies & Procedures   CARDIAC CATHETERIZATION  CARDIAC CATHETERIZATION 02/02/2023  Narrative Images from the original result were not included. 1. Ultrasound guided right common femoral artery access 2. Intraaortic balloon pump placement  In case of recurrent VT/VT or hemodynamic decompensation, will consider MCS escalation.   Elder Negus, MD Pager: [phone] Office: [phone]   CARDIAC CATHETERIZATION 02/02/2023  Narrative Images from the original result were not included.    Aggressive GDMT for HFrEF  Right and left heart catheterization 02/02/2023: LM: Normal LAD: Patnet mid LAD stent with no restenosis.stent thrombosis Lcx: Mid diffuse 50% disease Ramus: Patent stent. No restenosis RCA: Mid eccentric 40% disease. Rest mild diffuse disease  RA: 21 mmHg PA: 50/29 mmHg, mPAP 38 mmHg PCW: 29 mmHg  LV: 127/12 mmHg Ao: 102/77 mmHg CO: 5.5 L/min CI: 2.7 L/min/m2  Swan catheter left in place. IV lasix 20X2 administered in cath lab. Continue IV amiodarone. Needs aggressive HFrEF management. I had a long conversation with patient and family regarding importance of HFrEF management. Will get heart failure team consult.   Elder Negus, MD Pager: [phone] Office: [phone]  Findings Coronary Findings Diagnostic  Dominance: Right  Left Anterior Descending Vessel is normal in caliber. Vessel is angiographically normal. Non-stenotic Mid LAD-1 lesion was previously treated. The lesion is thrombotic. Previously placed stent displays rethrombosis. Non-stenotic Mid LAD-2 lesion was previously treated. The lesion is mildly calcified. Mid LAD-3 lesion is 40% stenosed.  Ramus Intermedius Vessel is large. Non-stenotic Ramus-1 lesion was previously treated. The  lesion is concentric. Non-stenotic Ramus-2 lesion with 0% stenosed side branch in Lat Ramus was previously treated. The lesion is type C and concentric. The lesion is calcified.  Lateral Ramus Intermedius Vessel is large in size.  Left Circumflex Prox Cx to Dist Cx lesion is 50% stenosed.  Right Coronary Artery The vessel exhibits minimal luminal irregularities. Mid RCA lesion is 40% stenosed.  Intervention  No interventions have been documented.   STRESS TESTS  MYOCARDIAL PERFUSION IMAGING 04/04/2018   ECHOCARDIOGRAM  ECHOCARDIOGRAM COMPLETE 02/01/2023  Narrative ECHOCARDIOGRAM REPORT    Patient Name:   Madison West Lefkowitz Date of Exam: 02/01/2023 Medical Rec #:  [id number]           Height:       68.0 in Accession #:    [id number]          Weight:       205.2 lb Date of Birth:  01-06-1956           BSA:          2.066 m Patient Age:    50 years  BP:           108/67 mmHg Patient Gender: F                   HR:           90 bpm. Exam Location:  Inpatient  Procedure: 2D Echo, Cardiac Doppler, Color Doppler and Intracardiac Opacification Agent  Indications:    Cardiac arrest I46.9  History:        Patient has prior history of Echocardiogram examinations, most recent 01/25/2023. Aortic Valve Disease; Risk Factors:Hypertension.  Sonographer:    Darlys Gales Referring Phys: [id number] Beulah Gandy PAYNE  IMPRESSIONS   1. Left ventricular ejection fraction, by estimation, is 20 to 25%. The left ventricle has severely decreased function. The left ventricle demonstrates regional wall motion abnormalities (see scoring diagram/findings for description). The left ventricular internal cavity size was moderately dilated. There is mild concentric left ventricular hypertrophy. Left ventricular diastolic parameters are indeterminate. 2. Right ventricular systolic function is normal. The right ventricular size is normal. Tricuspid regurgitation signal is inadequate for assessing PA  pressure. 3. Left atrial size was moderately dilated. 4. The mitral valve is normal in structure. No evidence of mitral valve regurgitation. No evidence of mitral stenosis. 5. The aortic valve is calcified. Aortic valve regurgitation is not visualized. Mild to moderate aortic valve stenosis. Aortic valve area, by VTI measures 1.10 cm. Aortic valve mean gradient measures 24.5 mmHg. Aortic valve Vmax measures 2.96 m/s. 6. The inferior vena cava is normal in size with greater than 50% respiratory variability, suggesting right atrial pressure of 3 mmHg.  FINDINGS Left Ventricle: Left ventricular ejection fraction, by estimation, is 20 to 25%. The left ventricle has severely decreased function. The left ventricle demonstrates regional wall motion abnormalities. Definity contrast agent was given IV to delineate the left ventricular endocardial borders. The left ventricular internal cavity size was moderately dilated. There is mild concentric left ventricular hypertrophy. Left ventricular diastolic parameters are indeterminate.   LV Wall Scoring: The anterior septum and apex are akinetic. The entire anterior wall, antero-lateral wall, inferior septum, entire inferior wall, and apical lateral segment are hypokinetic.  Right Ventricle: The right ventricular size is normal. No increase in right ventricular wall thickness. Right ventricular systolic function is normal. Tricuspid regurgitation signal is inadequate for assessing PA pressure.  Left Atrium: Left atrial size was moderately dilated.  Right Atrium: Right atrial size was normal in size.  Pericardium: There is no evidence of pericardial effusion.  Mitral Valve: The mitral valve is normal in structure. No evidence of mitral valve regurgitation. No evidence of mitral valve stenosis.  Tricuspid Valve: The tricuspid valve is normal in structure. Tricuspid valve regurgitation is not demonstrated. No evidence of tricuspid stenosis.  Aortic Valve:  The aortic valve is calcified. Aortic valve regurgitation is not visualized. Mild to moderate aortic stenosis is present. Aortic valve mean gradient measures 24.5 mmHg. Aortic valve peak gradient measures 35.0 mmHg. Aortic valve area, by VTI measures 1.10 cm.  Pulmonic Valve: The pulmonic valve was normal in structure. Pulmonic valve regurgitation is not visualized. No evidence of pulmonic stenosis.  Aorta: The aortic root is normal in size and structure.  Venous: The inferior vena cava is normal in size with greater than 50% respiratory variability, suggesting right atrial pressure of 3 mmHg.  IAS/Shunts: No atrial level shunt detected by color flow Doppler.   LEFT VENTRICLE PLAX 2D LVIDd:         5.50 cm   Diastology  LVIDs:         5.20 cm   LV e' medial:    9.14 cm/s LV PW:         1.20 cm   LV E/e' medial:  8.9 LV IVS:        1.10 cm   LV e' lateral:   4.13 cm/s LVOT diam:     1.80 cm   LV E/e' lateral: 19.7 LV SV:         70 LV SV Index:   34 LVOT Area:     2.54 cm   RIGHT VENTRICLE             IVC RV S prime:     12.00 cm/s  IVC diam: 2.50 cm TAPSE (M-mode): 3.4 cm  LEFT ATRIUM             Index        RIGHT ATRIUM          Index LA Vol (A2C):   79.7 ml 38.57 ml/m  RA Area:     9.99 cm LA Vol (A4C):   86.3 ml 41.76 ml/m  RA Volume:   17.90 ml 8.66 ml/m LA Biplane Vol: 87.1 ml 42.15 ml/m AORTIC VALVE AV Area (Vmax):    1.07 cm AV Area (Vmean):   0.98 cm AV Area (VTI):     1.10 cm AV Vmax:           296.00 cm/s AV Vmean:          242.500 cm/s AV VTI:            0.638 m AV Peak Grad:      35.0 mmHg AV Mean Grad:      24.5 mmHg LVOT Vmax:         125.00 cm/s LVOT Vmean:        93.100 cm/s LVOT VTI:          0.275 m LVOT/AV VTI ratio: 0.43  MITRAL VALVE MV Area (PHT): 4.33 cm     SHUNTS MV Decel Time: 175 msec     Systemic VTI:  0.28 m MV E velocity: 81.20 cm/s   Systemic Diam: 1.80 cm MV A velocity: 133.00 cm/s MV E/A ratio:  0.61  Kardie Tobb  DO Electronically signed by Thomasene Ripple DO Signature Date/Time: 02/01/2023/11:38:32 AM    Final     CT SCANS  CT CORONARY MORPH W/CTA COR W/SCORE 01/28/2023  Addendum 01/28/2023  5:37 PM ADDENDUM REPORT: 01/28/2023 17:01  CLINICAL DATA:  Aortic stenosis  EXAM: Cardiac TAVR CT  TECHNIQUE: The patient was scanned on a Siemens Force 192 slice scanner. A 120 kV retrospective scan was triggered in the descending thoracic aorta at 111 HU's. Gantry rotation speed was 270 msecs and collimation was .9 mm. No beta blockade or nitro were given. The 3D data set was reconstructed in 5% intervals of the R-R cycle. Systolic and diastolic phases were analyzed on a dedicated work station using MPR, MIP and VRT modes. The patient received 80 cc of contrast.  FINDINGS: Aortic Valve: Tri leaflet aortic valve calcified with restricted motion Calcium Score 2137 Large area of bulky calcification involving the non coronary cusp  Aorta: No aneurysm moderate calcific atherosclerosis  Sinotubular Junction: 23 mm  Ascending Thoracic Aorta: 32 mm  Aortic Arch: 30 mm  Descending Thoracic Aorta: 23 mm  Sinus of Valsalva Measurements:  Non-coronary: 30 mm   height 21 mm  Right - coronary: 29.4 mm  height 19 mm  Left - coronary: 28.1 mm   height 20.5 mm  Coronary Artery Height above Annulus:  Left Main: 13.9 mm above annulus  Right Coronary: 15.3 mm above annulus  Virtual Basal Annulus Measurements:  Maximum/Minimum Diameter: 25.1 mm x 22.3 mm Average diameter 23.7 mm  Perimeter: 75.2 mm  Area: 440 mm2  Coronary Arteries: Sufficient height above annulus for deployment  Optimum Fluoroscopic Angle for Delivery: LAO 1 Caudal 2 degrees  Membranous septal length 5.3 mm  IMPRESSION: 1. Calcified tri leaflet AV with score 2137 Bulky calcium noted on non coronary cusp  2. Annular area 440 mm2 suitable on lower end for 26 mm Sapien Ultra valve. Left coronary sinus small to consider 29  mm Medtronic Evolut  3.  Optimum angiographic angle for deployment LAO 1 Caudal 2 degrees  4.  Membranous septal length 5.3 mm  5.  Coronary arteries sufficient height above annulus for deployment  Charlton Haws   Electronically Signed By: Charlton Haws M.D. On: 01/28/2023 17:01  Narrative EXAM: OVER-READ INTERPRETATION  CT CHEST  The following report is a limited chest CT over-read performed by radiologist Dr. Allegra Lai of [facility] on 01/28/2023. This over-read does not include interpretation of cardiac or coronary anatomy or pathology. The cardiac TAVR interpretation by the cardiologist is attached.  COMPARISON:  None Available.  FINDINGS: Extracardiac findings will be described separately under dictation for contemporaneously obtained CTA chest, abdomen and pelvis.  IMPRESSION: Please see separate dictation for contemporaneously obtained CTA chest, abdomen and pelvis dated 01/28/2023 for full description of relevant extracardiac findings.  Electronically Signed: By: Allegra Lai M.D. On: 01/28/2023 16:24          Laboratory Data:  High Sensitivity Troponin:   Recent Labs  Lab 01/23/23 2208 01/23/23 2337 01/31/23 2010 02/01/23 0104  TROPONINIHS 35* 38* 172* >24,000*     Chemistry Recent Labs  Lab 02/01/23 0529 02/01/23 2053 02/02/23 0252 02/02/23 0353 02/02/23 0603 02/02/23 0626 02/02/23 1110  NA 130*  --  123*   < > 123* 124* 125*  K 4.2   < > 3.9   < > 4.0 4.2 4.4  CL 98  --  89*  --  94*  --   --   CO2 23  --  23  --  22  --   --   GLUCOSE 151*  --  173*  --  147*  --   --   BUN 18  --  15  --  14  --   --   CREATININE 0.68  --  0.67  --  0.73  --   --   CALCIUM 8.8*  --  8.3*  --  8.7*  --   --   GFRNONAA >60  --  >60  --  >60  --   --   ANIONGAP 9  --  11  --  7  --   --    < > = values in this interval not displayed.    Recent Labs  Lab 02/02/23 0252  PROT 5.4*  ALBUMIN 3.1*  AST 180*  ALT 90*  ALKPHOS 39   BILITOT 0.6   Hematology Recent Labs  Lab 02/01/23 0529 02/02/23 0252 02/02/23 0353 02/02/23 0603 02/02/23 0626 02/02/23 1110  WBC 12.8* 18.2*  --  17.2*  --   --   RBC 3.99 3.65*  --  3.61*  --   --   HGB 13.0 11.6*   < >  11.5* 11.6* 11.6*  HCT 38.5 33.9*   < > 33.6* 34.0* 34.0*  MCV 96.5 92.9  --  93.1  --   --   MCH 32.6 31.8  --  31.9  --   --   MCHC 33.8 34.2  --  34.2  --   --   RDW 13.1 13.2  --  13.2  --   --   PLT 442* 410*  --  526*  --   --    < > = values in this interval not displayed.   BNPNo results for input(s): "BNP", "PROBNP" in the last 168 hours.  DDimer No results for input(s): "DDIMER" in the last 168 hours.   Radiology/Studies:  DG CHEST PORT 1 VIEW  Result Date: 02/02/2023 CLINICAL DATA:  [id number] Dyspnea [id number] EXAM: PORTABLE CHEST - 1 VIEW COMPARISON:  02/01/2023 FINDINGS: Endotracheal tube has been placed, tip 5.3 cm above carina. Right IJ central line is been placed to the right pulmonary artery. IABP tip projects just below the aortic arch. Low lung volumes with patchy infiltrate or atelectasis at the left lung base as before. Heart size and mediastinal contours are within normal limits. Aortic Atherosclerosis (ICD10-170.0). Probable small left pleural effusion stable. Surgical clips right axilla. IMPRESSION: 1. Support devices as above. 2. Persistent left basilar infiltrate or atelectasis and small left effusion. Electronically Signed   By: Corlis Leak M.D.   On: 02/02/2023 07:50   CARDIAC CATHETERIZATION  Result Date: 02/02/2023 Images from the original result were not included. 1. Ultrasound guided right common femoral artery access 2. Intraaortic balloon pump placement In case of recurrent VT/VT or hemodynamic decompensation, will consider MCS escalation. Elder Negus, MD Pager: [phone] Office: [phone]  CARDIAC CATHETERIZATION  Addendum Date: 02/02/2023     Aggressive GDMT for HFrEF Right and left heart catheterization 02/02/2023:  LM: Normal LAD: [place] mid LAD stent with no restenosis.stent thrombosis Lcx: Mid diffuse 50% disease Ramus: Patent stent. No restenosis RCA: Mid eccentric 40% disease. Rest mild diffuse disease  RA: 21 mmHg PA: 50/29 mmHg, mPAP 38 mmHg PCW: 29 mmHg  LV: 127/12 mmHg Ao: 102/77 mmHg CO: 5.5 L/min CI: 2.7 L/min/m2 Swan catheter left in place. IV lasix 20X2 administered in cath lab. Continue IV amiodarone. Needs aggressive HFrEF management. I had a long conversation with patient and family regarding importance of HFrEF management. Will get heart failure team consult. Elder Negus, MD Pager: [phone] Office: [phone]    Result Date: 02/02/2023 Images from the original result were not included.   Aggressive GDMT for HFrEF Right and left heart catheterization 02/02/2023: LM: Normal LAD: Pat[place] mid LAD stent with no restenosis.stent thrombosis Lcx: Mid diffuse 50% disease Ramus: Patent stent. No restenosis RCA: Mid eccentric 40% disease. Rest mild diffuse disease  RA: 21 mmHg PA: 50/29 mmHg, mPAP 38 mmHg PCW: 29 mmHg  LV: 127/12 mmHg Ao: 102/77 mmHg CO: 5.5 L/min CI: 2.7 L/min/m2 Swan catheter left in place. IV lasix 20X2 administered in cath lab. Needs aggressive HFrEF management. I had a long conversation with patient and family regarding importance of HFrEF management. Will get heart failure team consult. Elder Negus, MD Pager: [phone] Office: [phone]    DG CHEST PORT 1 VIEW  Result Date: 02/01/2023 CLINICAL DATA:  Heart abnormality. Rapid response/CPR initiated. Right basilar opacities and pleural effusion. EXAM: PORTABLE CHEST 1 VIEW COMPARISON:  01/31/2023 FINDINGS: Cardiac enlargement. Small left pleural effusion with basilar infiltration or atelectasis. Infiltrates are increasing since the prior study,  possibly indicating compressive atelectasis or pneumonia. No developing consolidation on the right. Calcification of the aorta. Surgical clips in the right chest. No  pneumothorax. IMPRESSION: Small but increasing left pleural effusion and basilar infiltration or atelectasis. Electronically Signed   By: Burman Nieves M.D.   On: 02/01/2023 23:41   ECHOCARDIOGRAM COMPLETE  Result Date: 02/01/2023    ECHOCARDIOGRAM REPORT   Patient Name:   Madison West Date of Exam: 02/01/2023 Medical Rec #:  [id number]           Height:       68.0 in Accession #:    [id number]          Weight:       205.2 lb Date of Birth:  1955/07/23           BSA:          2.066 m Patient Age:    67 years            BP:           108/67 mmHg Patient Gender: F                   HR:           90 bpm. Exam Location:  Inpatient Procedure: 2D Echo, Cardiac Doppler, Color Doppler and Intracardiac            Opacification Agent Indications:    Cardiac arrest I46.9  History:        Patient has prior history of Echocardiogram examinations, most                 recent 01/25/2023. Aortic Valve Disease; Risk                 Factors:Hypertension.  Sonographer:    Darlys Gales Referring Phys: [id number] Beulah Gandy PAYNE IMPRESSIONS  1. Left ventricular ejection fraction, by estimation, is 20 to 25%. The left ventricle has severely decreased function. The left ventricle demonstrates regional wall motion abnormalities (see scoring diagram/findings for description). The left ventricular internal cavity size was moderately dilated. There is mild concentric left ventricular hypertrophy. Left ventricular diastolic parameters are indeterminate.  2. Right ventricular systolic function is normal. The right ventricular size is normal. Tricuspid regurgitation signal is inadequate for assessing PA pressure.  3. Left atrial size was moderately dilated.  4. The mitral valve is normal in structure. No evidence of mitral valve regurgitation. No evidence of mitral stenosis.  5. The aortic valve is calcified. Aortic valve regurgitation is not visualized. Mild to moderate aortic valve stenosis. Aortic valve area, by VTI measures 1.10 cm.  Aortic valve mean gradient measures 24.5 mmHg. Aortic valve Vmax measures 2.96 m/s.  6. The inferior vena cava is normal in size with greater than 50% respiratory variability, suggesting right atrial pressure of 3 mmHg. FINDINGS  Left Ventricle: Left ventricular ejection fraction, by estimation, is 20 to 25%. The left ventricle has severely decreased function. The left ventricle demonstrates regional wall motion abnormalities. Definity contrast agent was given IV to delineate the left ventricular endocardial borders. The left ventricular internal cavity size was moderately dilated. There is mild concentric left ventricular hypertrophy. Left ventricular diastolic parameters are indeterminate.  LV Wall Scoring: The anterior septum and apex are akinetic. The entire anterior wall, antero-lateral wall, inferior septum, entire inferior wall, and apical lateral segment are hypokinetic. Right Ventricle: The right ventricular size is normal. No increase in right ventricular wall thickness. Right ventricular systolic function  is normal. Tricuspid regurgitation signal is inadequate for assessing PA pressure. Left Atrium: Left atrial size was moderately dilated. Right Atrium: Right atrial size was normal in size. Pericardium: There is no evidence of pericardial effusion. Mitral Valve: The mitral valve is normal in structure. No evidence of mitral valve regurgitation. No evidence of mitral valve stenosis. Tricuspid Valve: The tricuspid valve is normal in structure. Tricuspid valve regurgitation is not demonstrated. No evidence of tricuspid stenosis. Aortic Valve: The aortic valve is calcified. Aortic valve regurgitation is not visualized. Mild to moderate aortic stenosis is present. Aortic valve mean gradient measures 24.5 mmHg. Aortic valve peak gradient measures 35.0 mmHg. Aortic valve area, by VTI measures 1.10 cm. Pulmonic Valve: The pulmonic valve was normal in structure. Pulmonic valve regurgitation is not visualized. No  evidence of pulmonic stenosis. Aorta: The aortic root is normal in size and structure. Venous: The inferior vena cava is normal in size with greater than 50% respiratory variability, suggesting right atrial pressure of 3 mmHg. IAS/Shunts: No atrial level shunt detected by color flow Doppler.  LEFT VENTRICLE PLAX 2D LVIDd:         5.50 cm   Diastology LVIDs:         5.20 cm   LV e' medial:    9.14 cm/s LV PW:         1.20 cm   LV E/e' medial:  8.9 LV IVS:        1.10 cm   LV e' lateral:   4.13 cm/s LVOT diam:     1.80 cm   LV E/e' lateral: 19.7 LV SV:         70 LV SV Index:   34 LVOT Area:     2.54 cm  RIGHT VENTRICLE             IVC RV S prime:     12.00 cm/s  IVC diam: 2.50 cm TAPSE (M-mode): 3.4 cm LEFT ATRIUM             Index        RIGHT ATRIUM          Index LA Vol (A2C):   79.7 ml 38.57 ml/m  RA Area:     9.99 cm LA Vol (A4C):   86.3 ml 41.76 ml/m  RA Volume:   17.90 ml 8.66 ml/m LA Biplane Vol: 87.1 ml 42.15 ml/m  AORTIC VALVE AV Area (Vmax):    1.07 cm AV Area (Vmean):   0.98 cm AV Area (VTI):     1.10 cm AV Vmax:           296.00 cm/s AV Vmean:          242.500 cm/s AV VTI:            0.638 m AV Peak Grad:      35.0 mmHg AV Mean Grad:      24.5 mmHg LVOT Vmax:         125.00 cm/s LVOT Vmean:        93.100 cm/s LVOT VTI:          0.275 m LVOT/AV VTI ratio: 0.43 MITRAL VALVE MV Area (PHT): 4.33 cm     SHUNTS MV Decel Time: 175 msec     Systemic VTI:  0.28 m MV E velocity: 81.20 cm/s   Systemic Diam: 1.80 cm MV A velocity: 133.00 cm/s MV E/A ratio:  0.61 Kardie Tobb DO Electronically signed by Thomasene Ripple DO Signature Date/Time: 02/01/2023/11:38:32 AM  Final    CARDIAC CATHETERIZATION  Result Date: 01/31/2023 Images from the original result were not included. LM: Normal LAD: 95% mid LAD stenosis with mild calcification and moderate tortuosity (New since 2021)          40% downstream lesion in mid LAD (New since 2021)          Otherwise mild diffuse disease Lcx: Mid diffuse 50% disease  (Previously deemed 80% in 2021) Ramus: Patent stent. No restenosis RCA: Not engaged today Successful percutaneous coronary intervention mid LAD     Aspiration thrombectomy     IVUS guided balloon dilatation with 3.25 and 3.5 mm [place] balloons up to 20 atm 0% residual stenosis at the stented segment. IVUS MSA 8.1 mm2, prior to final 3.5 [place] balloon expansion at 20 atm Elder Negus, MD Pager: [phone] Office: [phone]   DG Chest Port 1 View  Result Date: 01/31/2023 CLINICAL DATA:  Post cardiac event EXAM: PORTABLE CHEST 1 VIEW COMPARISON:  Chest radiograph dated 01/23/2023 FINDINGS: Right chest wall surgical clips. Normal lung volumes. Patchy right basilar opacities. Blunting of the left costophrenic angle. No pneumothorax. The heart size and mediastinal contours are within normal limits. No acute osseous abnormality. IMPRESSION: 1. Patchy right basilar opacities, likely atelectasis. 2. Blunting of the left costophrenic angle, which may represent a small pleural effusion. Electronically Signed   By: Agustin Cree M.D.   On: 01/31/2023 20:44   CARDIAC CATHETERIZATION  Addendum Date: 01/30/2023   Coronary intervention 01/29/2023: (Please see diagnostic coronary angiogram report 01/27/2023) Mid LAD 95% stenosis Successful percutaneous coronary intervention mid LAD        PTCA and stent placement 3.0 X 22 mm Onyx Frontier drug-eluting stent        Post dilatation using 3.25 X 12 mm [place] balloon up tp 18 atm 0% residual stenosis, TIMI flow III Elder Negus, MD Pager: [phone] Office: [phone]   Result Date: 01/30/2023 Images from the original result were not included. Coronary intervention 01/29/2023: (Please see diagnostic coronary angiogram report 01/27/2023) Mid LAD 95% stenosis Successful percutaneous coronary intervention mid LAD        PTCA and stent placement 3.0 X 22 mm Onyx Frontier drug-eluting stent        Post dilatation using 3.25 X 12 mm  balloon up tp 18 atm 0% residual stenosis, TIMI  flow III Elder Negus, MD Pager: [phone] Office: [phone]  DG Orthopantogram  Result Date: 01/29/2023 CLINICAL DATA:  Poor dentition. EXAM: ORTHOPANTOGRAM/PANORAMIC COMPARISON:  None Available. FINDINGS: A large dental caries is present in the residual first right maxillary molar, tooth 3. Metallic fillings are present bilaterally. No periapical lucencies are present. Root canals are present in teeth numbers 20, 28, 30 and 31. Mandible is unremarkable.  Visualized paranasal sinuses are clear. IMPRESSION: Large dental caries in the residual first right maxillary molar, tooth 3. Multiple metallic fillings and root canals as described. Electronically Signed   By: Marin Roberts M.D.   On: 01/29/2023 17:56     Procedure Type: Isolated AVR PERIOPERATIVE OUTCOME ESTIMATE % Operative Mortality 47.1% Morbidity & Mortality 47% Stroke 2% Renal Failure 4.56% Reoperation 3.09% Prolonged Ventilation 55.8% Deep Sternal Wound Infection 0.293% Long Hospital Stay (>14 days) 27.5% [facility] Stay (<6 days)* 5.34%  ________________________  [facility] Cardiomyopathy Questionnaire     02/02/2023   12:51 PM  KCCQ-12  1 a. Ability to shower/bathe Slightly limited  1 b. Ability to walk 1 block Moderately limited  1 c. Ability  to hurry/jog Other, Did not do  2. Edema feet/ankles/legs Every morning  3. Limited by fatigue All of the time  4. Limited by dyspnea All of the time  5. Sitting up / on 3+ pillows Every night  6. Limited enjoyment of life Extremely limited  7. Rest of life w/ symptoms Not at all satisfied  8 a. Participation in hobbies Severely limited  8 b. Participation in chores Severely limited  8 c. Visiting family/friends Severely limited    (This was verbally completed on a 8/8 but not put in system yet)   Assessment and Plan:   Madison West is a 67 y.o. female with symptoms of severe, stage D2 LFLG aortic stenosis with NYHA Class III symptoms who was  admitted for acute CHF and unstable angina and later developed STEMI 2/2 acute ISR, recurrent VT arrest and cardiogenic shock requiring IABP and intubation. Structural is asked to consider ECMO as a bridge to possible urgent TAVR. The patient's family are not ready to consider comfort care.    The patient's predicted risk of mortality with conventional aortic valve replacement is extremely high at 47.1% primarily based on her severe LV dysfunction, CAD with recent STEMI, recurrent VT arrests, cardiogenic shock with IABP and ECMO support. Cardiac gated CTA of the heart revealed anatomical characteristics consistent with aortic stenosis suitable for treatment by transcatheter aortic valve replacement without any significant complicating features and CTA of the aorta and iliac vessels demonstrated what appear to be adequate pelvic vascular access to facilitate a transfemoral approach.  We will plan to place on VA ECMO support and potentially do TAVR on Thursday if she remains stable. Prognosis remains extremely gaurded.   Dr. Lynnette Caffey to follow.   For questions or updates, please contact [facility] Please consult [email or web address] for contact info under    Signed, Cline Crock, PA-C  02/02/2023 12:55 PM  ATTENDING ATTESTATION:  After conducting a review of all available clinical information with the care team, interviewing the patient, and performing a physical exam, I agree with the findings and plan described in this note.   GEN: Intubated, sedated HEENT:  MMM, no JVD, no scleral icterus; RIJ swan Cardiac: RRR, + IABP sounds  Respiratory: Anterior lung fields coarse GI: Soft, nontender, non-distended  MS: No edema; No deformity. Neuro:  Nonfocal  Vasc:  RFA IABP, LFA arterial line. RFV sheath  The patient is a 67 year old female with a history of coronary artery disease status post remote PCI of the right coronary artery and most recently PCI of the left anterior descending  artery complicated by stent thrombosis.  She also has a history of breast cancer status postchemotherapy and radiation, hypertension, hyperlipidemia, and a right carotid endarterectomy in 2020.  Her course during her hospitalization included a VT arrest on the 11th which led to repeat coronary angiography due to anterior ST elevations.  The patient was found to have developed stent thrombosis treated with aspiration thrombectomy.  Unfortunately she developed recurrent episodes of pulseless VT requiring intubation, Neo-Synephrine, and intra-aortic balloon pump placed in the right femoral artery.  Advanced heart failure was consulted and I did discuss the case with Dr. Gala Romney.  Initially ECMO deployment was to be pursued however the patient improved with the addition of milrinone and norepinephrine.  Her most recent cardiac index was 2.32 with a cardiac output of 4.4.  Her urine output is quite robust.  After multidisciplinary review, we will plan on transcatheter aortic valve replacement on Thursday afternoon provided  the patient remained stable and is optimized from a hemodynamic standpoint with reasonable filling pressures.We will provisionally plan on a 26 SAPIEN 3 valve from the left transfemoral approach.  I had a long conversation with the patient's 2 daughters who understand and agree with this plan.  Madison Skeans, MD Pager [phone]

## 2023-02-02 NOTE — Interval H&P Note (Signed)
History and Physical Interval Note:  02/02/2023 11:50 AM  Madison West  has presented today for surgery, with the diagnosis of aortic stenosis.  The various methods of treatment have been discussed with the patient and family. After consideration of risks, benefits and other options for treatment, the patient has consented to  Procedure(s): ECMO CANNULATION (N/A) as a surgical intervention.  The patient's history has been reviewed, patient examined, no change in status, stable for surgery.  I have reviewed the patient's chart and labs.  Questions were answered to the patient's satisfaction.      

## 2023-02-02 NOTE — Transfer of Care (Signed)
Immediate Anesthesia Transfer of Care Note  Patient: Liahona Deslauriers  Procedure(s) Performed: RIGHT HEART CATH TEMPORARY PACEMAKER  Patient Location: ICU  Anesthesia Type:General  Level of Consciousness: sedated and Patient remains intubated per anesthesia plan  Airway & Oxygen Therapy: Patient remains intubated per anesthesia plan and Patient placed on Ventilator (see vital sign flow sheet for setting)  Post-op Assessment: Report given to RN and Post -op Vital signs reviewed and stable  Post vital signs: Reviewed and stable  Last Vitals:  Vitals Value Taken Time  BP 104/76 02/02/23 1704  Temp    Pulse 80 02/02/23 1704  Resp 18 02/02/23 1704  SpO2 97 % 02/02/23 1704    Last Pain:  Vitals:   02/02/23 1200  TempSrc: Core  PainSc:       Patients Stated Pain Goal: 0 (02/01/23 0800)  Complications: There were no known notable events for this encounter.

## 2023-02-02 NOTE — Progress Notes (Signed)
  Patient remains in profound cardiogenic shock despite IABP support.   Case d/w with CCM, TCTS, IC, Structural and ECMO teams at bedside.  TAVR CTs reviewed.   Patient felt to be a candidate for TAVR with mechanical support. Potential Impella support discussed but would have to be removed immediately pre-TAVR and patient currently too unstable for that.   Discussed options with family and have decided to proceed with VA ECMO today followed by TAVR later this week if stabilized.   Family aware of procedural risks including vascular damage, bleeding, loss of limb and death.   Prognosis extremely guarded.   Additional CCT 60 mins.   Arvilla Meres, MD  11:31 AM

## 2023-02-02 NOTE — Progress Notes (Signed)
Cross covering ICU physician.    Called to code blue on pt in 2h18. Arrived pt alert but disoriented s/p shock per RN with 2/2 pulseless vtach.   Rhythm irreg with freq pvc's. Pt continued to have nsvt but maintained mentation and BP. Was given 2 mg magnesium push as well as calcium 1gm and 2.5 mg metoprolol.   Cardiology was called and en route for eval.   Ultimately pt went back into vtach and sustained, given a bolus of lidocaine without immediate conversion. BP dropped precipitously and pt began to have poor mentation but maintained a pulse. She was synced and cardioverted, yet went into chb with hypotension. Rate noted to be in 30's. Started to transcutaneously pace, stopped amio and placed on 1 of lidocaine infusion. Eventually able to stop pacing and she had underlying rate and bp stabilized  Cardiology arrived at bedside and took to cath lab for [facility] and potential RHC.    Update 0140 02/02/23:   Eval'd pt post operatively, alert and rhythm stabilized. Pac placed for closer monitoring. No occlusion noted and stent remains patent per RN report PAP and PCW elevated.   Will hopefully be able to transition off lido back to amio in next 12-24 hours.

## 2023-02-02 NOTE — Progress Notes (Signed)
RT assisted with patient transport on vent from Cath Lab to 2H23 without complications.

## 2023-02-02 NOTE — Progress Notes (Signed)
  I accompanied Dr. Rosemary Holms in cath lab for possible ECMO cannulation.   He placed femoral arterial and venous sheaths for access.   We then spent over 2 hours adjusting pressors and inotropes in an attempt to maximize cardiac output and avoid the need for mechanical support while watching closely for ventricular ectopy  At the end of the case, we were able to obtain a CO/CI 4.4/2.1 on milrinone 0.375 and NE 8. There were frequent PACs and blocked PACs but no significant ventricular ectopy.   The decision was made to leave the sheaths in place and return patient to ICU for continue watchful waiting.   On return to ICU patient had sustained monomorphic VT and received shock x 1.   Thermo CO/CI  5.5/2.6  I rebolused amio and turned milrinone down to 0.25 and NE down to 5.   Family updated.  Total additional CCT > 2.5 hours  Arvilla Meres, MD  5:32 PM

## 2023-02-02 NOTE — Progress Notes (Signed)
ANTICOAGULATION CONSULT NOTE - Initial Consult  Pharmacy Consult for Heparin Indication: IABP  Allergies  Allergen Reactions   Prednisone Palpitations   Fentanyl Swelling and Other (See Comments)    Makes pt "hyper" feels she is "climbing the walls."  Last time she had a colonoscopy with Fentanyl and Versed she was awake the whole time.   Versed [Midazolam] Swelling and Anxiety    Makes pt "hyper" feels she is "climbing the walls."  Last time she had a colonoscopy with Fentanyl and Versed she was awake the whole time.   Vicodin [Hydrocodone-Acetaminophen] Anxiety    Extreme anxiety.  OK to take oxycodone.    Patient Measurements: Height: 5\' 8"  (172.7 cm) Weight: 93.1 kg (205 lb 4 oz) IBW/kg (Calculated) : 63.9 Heparin Dosing Weight: 85 kg  Vital Signs: Temp: 97.9 F (36.6 C) (08/13 0410) Temp Source: Oral (08/13 0130) BP: 101/82 (08/13 0330) Pulse Rate: 60 (08/13 0410)  Labs: Recent Labs    01/31/23 2010 02/01/23 0104 02/01/23 0529 02/02/23 0252 02/02/23 0353  HGB 14.1  --  13.0 11.6* 11.6*  HCT 46.0  --  38.5 33.9* 34.0*  PLT 433*  --  442* 410*  --   CREATININE 0.98  --  0.68 0.67  --   TROPONINIHS 172* >24,000*  --   --   --     Estimated Creatinine Clearance: 79.2 mL/min (by C-G formula based on SCr of 0.67 mg/dL).   Medical History: Past Medical History:  Diagnosis Date   Anxiety    Arthritis    low back and hip pain intermittent   Breast cancer (HCC) 06/02/07   r breast -surgery ,radiaology. chemotherapy   Carpal tunnel syndrome    right hand   Colon polyps    Complication of anesthesia    Fentanyl, Versed-makes extra hyper, bradycardia x 1 in PACU, [facility] (08/15/11 cardiology felt neostigmine may have resulted in AV nodal block)    Coronary artery disease    Depression    denies   Dysplasia of vulva    Hypertension    Palpitations    PSVT, s/p adenosine 08/04/16   S/P breast lumpectomy 07/04/07   R breast   S/P radiation therapy 2009     Medications:  Scheduled:   ALPRAZolam  1 mg Oral Q6H   aspirin  81 mg Oral Daily   busPIRone  5 mg Oral BID   Chlorhexidine Gluconate Cloth  6 each Topical Daily   etomidate       fentaNYL       fentaNYL (SUBLIMAZE) injection  50 mcg Intravenous Once   lidocaine  1 patch Transdermal Q24H   metoprolol succinate  25 mg Oral Daily   metoprolol tartrate  2.5 mg Intravenous Once   midazolam       omega-3 acid ethyl esters  1 g Oral Daily   pantoprazole  40 mg Oral Daily   phenylephrine       potassium chloride  20 mEq Oral Daily   rocuronium bromide       rosuvastatin  20 mg Oral Daily   sodium chloride flush  3 mL Intravenous Q12H   sodium chloride flush  3 mL Intravenous Q12H   sodium chloride flush  3 mL Intravenous Q12H   ticagrelor  90 mg Oral BID    Assessment: 67 y.o. female with CHF s/p PCI, now with recurrent VT/VF s/p IABP placement for heparin.  Received heparin 4000 units IV in cath lab at 0500  Goal  of Therapy:  Heparin level 0.2-0.5 units/mL Monitor platelets by anticoagulation protocol: Yes   Plan:  Start heparin 1000 units/hr Check heparin level in 8 hours.   Eddie Candle 02/02/2023,6:08 AM

## 2023-02-02 NOTE — Interval H&P Note (Signed)
History and Physical Interval Note:  02/02/2023 4:31 AM  Madison West  has presented today for surgery, with the diagnosis of CARDIOMYOPATHY.  The various methods of treatment have been discussed with the patient and family. After consideration of risks, benefits and other options for treatment, the patient has consented to  Procedure(s): IABP Insertion (N/A) as a surgical intervention.  The patient's history has been reviewed, patient examined, no change in status, stable for surgery.  I have reviewed the patient's chart and labs.  Questions were answered to the patient's satisfaction.      J 

## 2023-02-02 NOTE — Progress Notes (Addendum)
Recurrent polymorphic VT/VF requiring multiple shocks and intubation for airway protection. Post intubation, she has been hypotensive requiring 75 mic of neosynephrine. Discussed with Dr. Gala Romney. Will plan to place IABP. If further VF/VT or hemodynamic decompensation, will consider ECMO.  CRITICAL CARE Performed by: Truett Mainland   Total critical care time: 60 minutes   Critical care time was exclusive of separately billable procedures and treating other patients.   Critical care was necessary to treat or prevent imminent or life-threatening deterioration.   Critical care was time spent personally by me on the following activities: development of treatment plan with patient and/or surrogate as well as nursing, discussions with consultants, evaluation of patient's response to treatment, examination of patient, obtaining history from patient or surrogate, ordering and performing treatments and interventions, ordering and review of laboratory studies, ordering and review of radiographic studies, pulse oximetry and re-evaluation of patient's condition.      Elder Negus, MD Pager: [phone] Office: [phone]

## 2023-02-02 NOTE — Progress Notes (Incomplete)
ECMO INITIATION   Patient: Madison West, Feb 22, 1956, 67 y.o. Location:   Date of Service:  02/02/2023     Time: 1:44 PM  Date of Admission: 01/23/2023 Admitting diagnosis: CHF (congestive heart failure) (HCC)  Ht: 5\' 8"  (172.7 cm) Wt: 96 kg BSA: Body surface area is 2.15 meters squared.  Blood Type: O POS Allergies:  Allergies  Allergen Reactions   Prednisone Palpitations   Fentanyl Swelling and Other (See Comments)    Makes pt "hyper" feels she is "climbing the walls."  Last time she had a colonoscopy with Fentanyl and Versed she was awake the whole time.   Versed [Midazolam] Swelling and Anxiety    Makes pt "hyper" feels she is "climbing the walls."  Last time she had a colonoscopy with Fentanyl and Versed she was awake the whole time.   Vicodin [Hydrocodone-Acetaminophen] Anxiety    Extreme anxiety.  OK to take oxycodone.    Past medical history:  Past Medical History:  Diagnosis Date   Anxiety    Arthritis    low back and hip pain intermittent   Breast cancer (HCC) 06/02/07   r breast -surgery ,radiaology. chemotherapy   Carpal tunnel syndrome    right hand   Colon polyps    Complication of anesthesia    Fentanyl, Versed-makes extra hyper, bradycardia x 1 in PACU, [facility] (08/15/11 cardiology felt neostigmine may have resulted in AV nodal block)    Coronary artery disease    Depression    denies   Dysplasia of vulva    Hypertension    Palpitations    PSVT, s/p adenosine 08/04/16   S/P breast lumpectomy 07/04/07   R breast   S/P radiation therapy 2009   Past surgical history:  Past Surgical History:  Procedure Laterality Date   APPENDECTOMY     age 62   CESAREAN SECTION     x 2   COLONOSCOPY W/ POLYPECTOMY     COLONOSCOPY WITH PROPOFOL N/A 04/27/2016   Procedure: COLONOSCOPY WITH PROPOFOL;  Surgeon: Charolett Bumpers, MD;  Location: [facility];  Service: Endoscopy;  Laterality: N/A;   CORONARY STENT INTERVENTION N/A 01/29/2023   Procedure: CORONARY STENT  INTERVENTION;  Surgeon: Elder Negus, MD;  Location: [facility] INVASIVE CV LAB;  Service: Cardiovascular;  Laterality: N/A;   CORONARY THROMBECTOMY N/A 01/31/2023   Procedure: Coronary Thrombectomy;  Surgeon: Elder Negus, MD;  Location: [facility] CV [facility];  Service: Cardiovascular;  Laterality: N/A;   CORONARY ULTRASOUND/IVUS N/A 01/31/2023   Procedure: Coronary Ultrasound/IVUS;  Surgeon: Elder Negus, MD;  Location: [facility]VAS[facility] CV LAB;  Service: Cardiovascular;  Laterality: N/A;   CORONARY/GRAFT ACUTE MI REVASCULARIZATION N/A 01/31/2023   Procedure: Coronary/Graft Acute MI Revascularization;  Surgeon: Elder Negus, MD;  Location: [facility] CV [facility];  Service: Cardiovascular;  Laterality: N/A;   DILATION AND CURETTAGE OF UTERUS     multiple   ENDARTERECTOMY Right 06/09/2019   ENDARTERECTOMY Right 06/09/2019   Procedure: ENDARTERECTOMY CAROTID RIGHT;  Surgeon: Sherren Kerns, MD;  Location: [facility] OR;  Service: Vascular;  Laterality: Right;   IABP INSERTION N/A 02/02/2023   Procedure: IABP Insertion;  Surgeon: Elder Negus, MD;  Location: [facility] CV [facility];  Service: Cardiovascular;  Laterality: N/A;   INTRAUTERINE DEVICE INSERTION     IUD REMOVAL     LEFT HEART CATH AND CORONARY ANGIOGRAPHY N/A 02/27/2020   Procedure: LEFT HEART CATH AND CORONARY ANGIOGRAPHY;  Surgeon: Yates Decamp, MD;  Location: [facility] INVASIVE CV  LAB;  Service: Cardiovascular;  Laterality: N/A;   LEFT HEART CATH AND CORONARY ANGIOGRAPHY N/A 01/31/2023   Procedure: LEFT HEART CATH AND CORONARY ANGIOGRAPHY;  Surgeon: Elder Negus, MD;  Location: [facility] CV [facility];  Service: Cardiovascular;  Laterality: N/A;   MASTECTOMY PARTIAL / LUMPECTOMY W/ AXILLARY LYMPHADENECTOMY Right    lumpectomy and lymph nodes removed   PATCH ANGIOPLASTY Right 06/09/2019   Procedure: Patch Angioplasty of right carotid artery using hemashield paltinum finesse patch;  Surgeon: Sherren Kerns, MD;  Location: [facility] OR;   Service: Vascular;  Laterality: Right;   RIGHT AND LEFT HEART CATH N/A 02/01/2023   Procedure: RIGHT AND LEFT HEART CATH;  Surgeon: Elder Negus, MD;  Location: [facility] CV LAB;  Service: Cardiovascular;  Laterality: N/A;   RIGHT/LEFT HEART CATH AND CORONARY ANGIOGRAPHY N/A 01/27/2023   Procedure: RIGHT/LEFT HEART CATH AND CORONARY ANGIOGRAPHY;  Surgeon: Elder Negus, MD;  Location: [facility] INVASIVE CV LAB;  Service: Cardiovascular;  Laterality: N/A;   TUBAL LIGATION     VULVA SURGERY     Multiple times for dysplasia    Indication for ECMO: Cardiogenic Shock  ECMO was deployed at 10:52 AM and initiated at ***  Anticoagulation achieved with Heparin bolus of 5000 units given to patient at ***. Cannulated for VA and achieved initial ECMO   and ECMO  .    ECMO Cannula Information     Staff Present  Primary Perfusionist Toledo Quick  Assisting Perfusionist/ECMO Specialist Pecola Lawless [facility]  Cannulating Physician Dan Bensimhon   ECMO Lot Numbers  CardioHelp Console  [id number]  Oxygenator  [id number]  Tubing Pack  [id number]  ECMO Goals  Flow goal 3.5-4.5  Anticoagulation goal PTT   Cardiac goal MAP >65  Respiratory goal Sat >88%  Other goal    ECMO Handoff  Patient Information * Age Height Weight BSA IBW BMI  67 y.o. 5\' 8"  (172.7 cm)  (96 kg Body surface area is 2.15 meters squared. No data recorded Body mass index is 32.18 kg/m.   Review History * Primary Diagnosis   CHF (congestive heart failure) (HCC)  Prior Cardiac Arrest within 24hrs of ECMO initiation? Yes  ECMO and MCS * Type ECMO Flow ECMO Sweep Gases   VA             Additional Mechanical Support IABP RFA  Ventilation *    $ Ventilator Initial/Subsequent : Initial, Vent Mode: PRVC, Vt Set: 560 mL, Set Rate: 18 bmp, FiO2 (%): 60 %, I Time: 0.9 Sec(s), PEEP: 5 cmH20     Cannula Size and Locations       Drainage RFV 25 Fr   Return LFA 19 Fr    *Cannula(e) sutured and anchored,  secured and dressed.   Labs and Imaging *  *Cannulation position verified via imaging on arrival to ICU. Concerns communicated to attending surgeon. Labs reviewed.   All ECMO safety checks complete. ECMO flowsheet initiated, applicable charges captured, LDA's entered/confirmed, imaging and labs verified, blood products available, and report given to [facility].

## 2023-02-02 NOTE — H&P (View-Only) (Signed)
  Patient remains in profound cardiogenic shock despite IABP support.   Case d/w with CCM, TCTS, IC, Structural and ECMO teams at bedside.  TAVR CTs reviewed.   Patient felt to be a candidate for TAVR with mechanical support. Potential Impella support discussed but would have to be removed immediately pre-TAVR and patient currently too unstable for that.   Discussed options with family and have decided to proceed with VA ECMO today followed by TAVR later this week if stabilized.   Family aware of procedural risks including vascular damage, bleeding, loss of limb and death.   Prognosis extremely guarded.   Additional CCT 60 mins.   Arvilla Meres, MD  11:31 AM

## 2023-02-02 NOTE — Consult Note (Addendum)
Advanced Heart Failure Team Consult Note   Primary Physician: [name], MD PCP-Cardiologist:  None  Reason for Consultation: Cardiogenic shock  HPI:    Madison West is seen today for evaluation of cardiogenic shock at the request of Dr. Rosemary Holms with [facility]. 67 y.o. female with history of breast cancer s/p chemo and radiation in 2009, carotid stenosis s/p right CEA in 2020, CAD s/p PCI to RCA in 2021, aortic stenosis.   Presented 01/27/23 with unstable angina. R/LHC with 95% m LAD, PA mean 14, PCWP 4, CI 2.7. Echo with EF 30-35%, aortic valve stenosis with mean gradient of 20 mmHg, AVA 0.93 cm2, DI 0.33 w/ likely low flow low gradient AS.  She was seen by [facility] and not felt to be a candidate for CABG + SAVR. Given reduced LV function and comorbidities, surgery was felt to cary increased risk. TAVR and PCI to LAD felt to be a better option. She's had significant anxiety and hesitancy to take medications.   On 08/11, had pulseless VT. CPR started, shocked X 2 and given epi. ROSC achieved. Had recurrent VT s/p shock and wsa started on amiodarone gtt. Code STEMI activated d/t anterior ST elevations. Coronary angiography with LAD stent thrombosis treated with aspiration thrombectomy and IVUS guided balloon dilatation. Had recurrent episodes of pulseless VT last night for which she required multiple shocks and mechanical ventilation. Post intubation she was started on neosynephrine for hypotension. Taken back to the cath lab. LAD stent patent. D/t recurrent VT with hemodynamic instability + cardiogenic shock IABP was placed early this am.   CI 1.3 on IABP at 1:1. Not on inotrope support d/t recurrent VT.   She is intubated and sedated.  Home Medications Prior to Admission medications   Medication Sig Start Date End Date Taking? Authorizing Provider  acetaminophen (TYLENOL) 500 MG tablet Take 1,000 mg by mouth every 6 (six) hours as needed for fever or mild pain.   Yes  [provider]  alprazolam Prudy Feeler) 2 MG tablet Take 0.25-2 mg by mouth See admin instructions. 1 mg (may take up to 2 mg) twice daily. May also take 0.25 mg during the day if needed for anxiety.   Yes [provider]  Ascorbic Acid (VITAMIN C PO) Take 2 tablets by mouth daily.   Yes [provider]  aspirin 81 MG chewable tablet Chew 81 mg by mouth daily.    Yes [provider]  Cholecalciferol (VITAMIN D3 PO) Take 1 capsule by mouth daily.   Yes [provider]  Coenzyme Q10 (CO Q 10 PO) Take 1 capsule by mouth daily.   Yes [provider]  GARLIC PO Take 1 capsule by mouth daily.   Yes [provider]  losartan-hydrochlorothiazide (HYZAAR) 50-12.5 MG tablet Take 1 tablet by mouth daily. Patient taking differently: Take 0.75 tablets by mouth daily. 09/30/22  Yes Yates Decamp, MD  Multiple Vitamins-Minerals (ZINC PO) Take 1 tablet by mouth daily.   Yes [provider]  nitroGLYCERIN (NITROSTAT) 0.4 MG SL tablet Place 1 tablet (0.4 mg total) under the tongue every 5 (five) minutes as needed for chest pain. 08/04/22  Yes Yates Decamp, MD  Omega-3 Fatty Acids (FISH OIL PO) Take 1 capsule by mouth daily.   Yes [provider]  TURMERIC CURCUMIN PO Take 1 tablet by mouth daily.   Yes [provider]    Past Medical History: Past Medical History:  Diagnosis Date   Anxiety  Arthritis    low back and hip pain intermittent   Breast cancer (HCC) 06/02/07   r breast -surgery ,radiaology. chemotherapy   Carpal tunnel syndrome    right hand   Colon polyps    Complication of anesthesia    Fentanyl, Versed-makes extra hyper, bradycardia x 1 in PACU, [facility] (08/15/11 cardiology felt neostigmine may have resulted in AV nodal block)    Coronary artery disease    Depression    denies   Dysplasia of vulva    Hypertension    Palpitations    PSVT, s/p adenosine 08/04/16   S/P breast lumpectomy 07/04/07   R breast   S/P  radiation therapy 2009    Past Surgical History: Past Surgical History:  Procedure Laterality Date   APPENDECTOMY     age 53   CESAREAN SECTION     x 2   COLONOSCOPY W/ POLYPECTOMY     COLONOSCOPY WITH PROPOFOL N/A 04/27/2016   Procedure: COLONOSCOPY WITH PROPOFOL;  Surgeon: Charolett Bumpers, MD;  Location: [facility];  Service: Endoscopy;  Laterality: N/A;   CORONARY STENT INTERVENTION N/A 01/29/2023   Procedure: CORONARY STENT INTERVENTION;  Surgeon: Elder Negus, MD;  Location: [facility];  Service: Cardiovascular;  Laterality: N/A;   CORONARY THROMBECTOMY N/A 01/31/2023   Procedure: Coronary Thrombectomy;  Surgeon: Elder Negus, MD;  Location: [facility];  Service: Cardiovascular;  Laterality: N/A;   CORONARY ULTRASOUND/IVUS N/A 01/31/2023   Procedure: Coronary Ultrasound/IVUS;  Surgeon: Elder Negus, MD;  Location: [facility];  Service: Cardiovascular;  Laterality: N/A;   CORONARY/GRAFT ACUTE MI REVASCULARIZATION N/A 01/31/2023   Procedure: Coronary/Graft Acute MI Revascularization;  Surgeon: Elder Negus, MD;  Location: [facility];  Service: Cardiovascular;  Laterality: N/A;   DILATION AND CURETTAGE OF UTERUS     multiple   ENDARTERECTOMY Right 06/09/2019   ENDARTERECTOMY Right 06/09/2019   Procedure: ENDARTERECTOMY CAROTID RIGHT;  Surgeon: Sherren Kerns, MD;  Location: [facility] OR;  Service: Vascular;  Laterality: Right;   INTRAUTERINE DEVICE INSERTION     IUD REMOVAL     LEFT HEART CATH AND CORONARY ANGIOGRAPHY N/A 02/27/2020   Procedure: LEFT HEART CATH AND CORONARY ANGIOGRAPHY;  Surgeon: Yates Decamp, MD;  Location: [facility];  Service: Cardiovascular;  Laterality: N/A;   LEFT HEART CATH AND CORONARY ANGIOGRAPHY N/A 01/31/2023   Procedure: LEFT HEART CATH AND CORONARY ANGIOGRAPHY;  Surgeon: Elder Negus, MD;  Location: [facility];  Service: Cardiovascular;  Laterality: N/A;   MASTECTOMY PARTIAL /  LUMPECTOMY W/ AXILLARY LYMPHADENECTOMY Right    lumpectomy and lymph nodes removed   PATCH ANGIOPLASTY Right 06/09/2019   Procedure: Patch Angioplasty of right carotid artery using hemashield paltinum finesse patch;  Surgeon: Sherren Kerns, MD;  Location: [facility] OR;  Service: Vascular;  Laterality: Right;   RIGHT/LEFT HEART CATH AND CORONARY ANGIOGRAPHY N/A 01/27/2023   Procedure: RIGHT/LEFT HEART CATH AND CORONARY ANGIOGRAPHY;  Surgeon: Elder Negus, MD;  Location: [facility];  Service: Cardiovascular;  Laterality: N/A;   TUBAL LIGATION     VULVA SURGERY     Multiple times for dysplasia    Family History: Family History  Problem Relation Age of Onset   Colon cancer Mother 1   Heart disease Father        heart attack   Colon polyps Brother        one brother had large colon polyp that  needed surgery to be removed   Heart disease Paternal Grandfather    Diabetes type II Brother     Social History: Social History   Socioeconomic History   Marital status: Widowed    Spouse name: Not on file   Number of children: 2   Years of education: Not on file   Highest education level: Not on file  Occupational History   Occupation: server    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  Tobacco Use   Smoking status: Former    Current packs/day: 0.00    Average packs/day: 0.8 packs/day for 20.0 years (15.0 ttl pk-yrs)    Types: Cigarettes    Start date: 05/06/1977    Quit date: 05/06/1997    Years since quitting: 25.7   Smokeless tobacco: Never  Vaping Use   Vaping status: Never Used  Substance and Sexual Activity   Alcohol use: Yes    Alcohol/week: 0.0 standard drinks of alcohol    Comment: occ   Drug use: No   Sexual activity: Not Currently  Other Topics Concern   Not on file  Social History Narrative   Not on file   Social Determinants of Health   Financial Resource Strain: Not on file  Food Insecurity: No Food Insecurity (01/24/2023)   Hunger Vital Sign    Worried About  Running Out of Food in the Last Year: Never true    Ran Out of Food in the Last Year: Never true  Transportation Needs: No Transportation Needs (01/24/2023)   PRAPARE - Administrator, Civil Service (Medical): No    Lack of Transportation (Non-Medical): No  Physical Activity: Not on file  Stress: Not on file  Social Connections: Not on file    Allergies:  Allergies  Allergen Reactions   Prednisone Palpitations   Fentanyl Swelling and Other (See Comments)    Makes pt "hyper" feels she is "climbing the walls."  Last time she had a colonoscopy with Fentanyl and Versed she was awake the whole time.   Versed [Midazolam] Swelling and Anxiety    Makes pt "hyper" feels she is "climbing the walls."  Last time she had a colonoscopy with Fentanyl and Versed she was awake the whole time.   Vicodin [Hydrocodone-Acetaminophen] Anxiety    Extreme anxiety.  OK to take oxycodone.    Objective:    Vital Signs:   Temp:  [97.3 F (36.3 C)-98.2 F (36.8 C)] 97.3 F (36.3 C) (08/13 0700) Pulse Rate:  [42-143] 84 (08/13 0700) Resp:  [0-81] 18 (08/13 0700) BP: (73-201)/(52-141) 139/88 (08/13 0600) SpO2:  [75 %-100 %] 100 % (08/13 0700) Arterial Line BP: (99-161)/(26-89) 139/41 (08/13 0700) FiO2 (%):  [100 %] 100 % (08/13 0311) Last BM Date : 01/28/23  Weight change: Filed Weights   01/30/23 0534 01/31/23 0430 02/01/23 0500  Weight: 93.8 kg 93.3 kg 93.1 kg    Intake/Output:   Intake/Output Summary (Last 24 hours) at 02/02/2023 0705 Last data filed at 02/02/2023 2956 Gross per 24 hour  Intake 225.05 ml  Output 860 ml  Net -634.95 ml      Physical Exam    General:  Critically ill appearing.  HEENT: + ETT Neck: supple. R internal jugular SWAN. Carotids 2+ bilat; no bruits.  Cor: PMI nondisplaced. Regular rate & rhythm. No rubs, gallops, 3/6 AS murmur Lungs: clear Abdomen: soft, nontender, nondistended.  Extremities: no cyanosis, clubbing, rash, edema Neuro: Sedated on  vent   Telemetry   Sinus brady 50s, frequent PVCs  and brief runs NSVT (3-4 beats)  Labs   Basic Metabolic Panel: Recent Labs  Lab 01/30/23 0322 01/31/23 0645 01/31/23 2010 02/01/23 0529 02/01/23 2053 02/02/23 0252 02/02/23 0353 02/02/23 0603 02/02/23 0626  NA 132*  --  130* 130*  --  123* 122* 123* 124*  K 4.8  --  5.5* 4.2 4.6 3.9 3.4* 4.0 4.2  CL 100  --  95* 98  --  89*  --  94*  --   CO2 24  --  19* 23  --  23  --  22  --   GLUCOSE 109*  --  178* 151*  --  173*  --  147*  --   BUN 22  --  23 18  --  15  --  14  --   CREATININE 0.70 0.73 0.98 0.68  --  0.67  --  0.73  --   CALCIUM 8.8*  --  8.9 8.8*  --  8.3*  --  8.7*  --   MG 2.2  --  2.6* 2.2 1.8 2.9*  --  2.7*  --   PHOS  --   --   --   --   --   --   --  3.7  --     Liver Function Tests: Recent Labs  Lab 02/02/23 0252  AST 180*  ALT 90*  ALKPHOS 39  BILITOT 0.6  PROT 5.4*  ALBUMIN 3.1*   No results for input(s): "LIPASE", "AMYLASE" in the last 168 hours. No results for input(s): "AMMONIA" in the last 168 hours.  CBC: Recent Labs  Lab 01/30/23 0322 01/31/23 2010 02/01/23 0529 02/02/23 0252 02/02/23 0353 02/02/23 0603 02/02/23 0626  WBC 6.9 13.7* 12.8* 18.2*  --  17.2*  --   HGB 13.5 14.1 13.0 11.6* 11.6* 11.5* 11.6*  HCT 40.3 46.0 38.5 33.9* 34.0* 33.6* 34.0*  MCV 97.1 102.7* 96.5 92.9  --  93.1  --   PLT 405* 433* 442* 410*  --  526*  --     Cardiac Enzymes: No results for input(s): "CKTOTAL", "CKMB", "CKMBINDEX", "TROPONINI" in the last 168 hours.  BNP: BNP (last 3 results) Recent Labs    01/23/23 2208  BNP 462.3*    ProBNP (last 3 results) No results for input(s): "PROBNP" in the last 8760 hours.   CBG: Recent Labs  Lab 02/01/23 0722 02/01/23 1116 02/01/23 1533 02/01/23 1937 02/02/23 0128  GLUCAP 119* 134* 112* 140* 168*    Coagulation Studies: No results for input(s): "LABPROT", "INR" in the last 72 hours.   Imaging   CARDIAC CATHETERIZATION  Result Date:  02/02/2023 Images from the original result were not included. 1. Ultrasound guided right common femoral artery access 2. Intraaortic balloon pump placement In case of recurrent VT/VT or hemodynamic decompensation, will consider MCS escalation. Elder Negus, MD Pager: [phone] Office: [phone]  CARDIAC CATHETERIZATION  Addendum Date: 02/02/2023     Aggressive GDMT for HFrEF Right and left heart catheterization 02/02/2023: LM: Normal LAD: Patnet mid LAD stent with no restenosis.stent thrombosis Lcx: Mid diffuse 50% disease Ramus: Patent stent. No restenosis RCA: Mid eccentric 40% disease. Rest mild diffuse disease  RA: 21 mmHg PA: 50/29 mmHg, mPAP 38 mmHg PCW: 29 mmHg  LV: 127/12 mmHg Ao: 102/77 mmHg CO: 5.5 L/min CI: 2.7 L/min/m2 Swan catheter left in place. IV lasix 20X2 administered in cath lab. Continue IV amiodarone. Needs aggressive HFrEF management. I had a long conversation with patient and family regarding importance  of HFrEF management. Will get heart failure team consult. Elder Negus, MD Pager: [phone] Office: [phone]    Result Date: 02/02/2023 Images from the original result were not included.   Aggressive GDMT for HFrEF Right and left heart catheterization 02/02/2023: LM: Normal LAD: Patnet mid LAD stent with no restenosis.stent thrombosis Lcx: Mid diffuse 50% disease Ramus: Patent stent. No restenosis RCA: Mid eccentric 40% disease. Rest mild diffuse disease  RA: 21 mmHg PA: 50/29 mmHg, mPAP 38 mmHg PCW: 29 mmHg  LV: 127/12 mmHg Ao: 102/77 mmHg CO: 5.5 L/min CI: 2.7 L/min/m2 Swan catheter left in place. IV lasix 20X2 administered in cath lab. Needs aggressive HFrEF management. I had a long conversation with patient and family regarding importance of HFrEF management. Will get heart failure team consult. Elder Negus, MD Pager: [phone] Office: [phone]    DG CHEST PORT 1 VIEW  Result Date: 02/01/2023 CLINICAL DATA:  Heart abnormality. Rapid  response/CPR initiated. Right basilar opacities and pleural effusion. EXAM: PORTABLE CHEST 1 VIEW COMPARISON:  01/31/2023 FINDINGS: Cardiac enlargement. Small left pleural effusion with basilar infiltration or atelectasis. Infiltrates are increasing since the prior study, possibly indicating compressive atelectasis or pneumonia. No developing consolidation on the right. Calcification of the aorta. Surgical clips in the right chest. No pneumothorax. IMPRESSION: Small but increasing left pleural effusion and basilar infiltration or atelectasis. Electronically Signed   By: Burman Nieves M.D.   On: 02/01/2023 23:41   ECHOCARDIOGRAM COMPLETE  Result Date: 02/01/2023    ECHOCARDIOGRAM REPORT   Patient Name:   Madison West Date of Exam: 02/01/2023 Medical Rec #:  [id number]           Height:       68.0 in Accession #:    [id number]          Weight:       205.2 lb Date of Birth:  Sep 28, 1955           BSA:          2.066 m Patient Age:    67 years            BP:           108/67 mmHg Patient Gender: F                   HR:           90 bpm. Exam Location:  Inpatient Procedure: 2D Echo, Cardiac Doppler, Color Doppler and Intracardiac            Opacification Agent Indications:    Cardiac arrest I46.9  History:        Patient has prior history of Echocardiogram examinations, most                 recent 01/25/2023. Aortic Valve Disease; Risk                 Factors:Hypertension.  Sonographer:    Darlys Gales Referring Phys: [id number] Beulah Gandy PAYNE IMPRESSIONS  1. Left ventricular ejection fraction, by estimation, is 20 to 25%. The left ventricle has severely decreased function. The left ventricle demonstrates regional wall motion abnormalities (see scoring diagram/findings for description). The left ventricular internal cavity size was moderately dilated. There is mild concentric left ventricular hypertrophy. Left ventricular diastolic parameters are indeterminate.  2. Right ventricular systolic function is normal. The  right ventricular size is normal. Tricuspid regurgitation signal is inadequate for assessing PA pressure.  3. Left atrial size was  moderately dilated.  4. The mitral valve is normal in structure. No evidence of mitral valve regurgitation. No evidence of mitral stenosis.  5. The aortic valve is calcified. Aortic valve regurgitation is not visualized. Mild to moderate aortic valve stenosis. Aortic valve area, by VTI measures 1.10 cm. Aortic valve mean gradient measures 24.5 mmHg. Aortic valve Vmax measures 2.96 m/s.  6. The inferior vena cava is normal in size with greater than 50% respiratory variability, suggesting right atrial pressure of 3 mmHg. FINDINGS  Left Ventricle: Left ventricular ejection fraction, by estimation, is 20 to 25%. The left ventricle has severely decreased function. The left ventricle demonstrates regional wall motion abnormalities. Definity contrast agent was given IV to delineate the left ventricular endocardial borders. The left ventricular internal cavity size was moderately dilated. There is mild concentric left ventricular hypertrophy. Left ventricular diastolic parameters are indeterminate.  LV Wall Scoring: The anterior septum and apex are akinetic. The entire anterior wall, antero-lateral wall, inferior septum, entire inferior wall, and apical lateral segment are hypokinetic. Right Ventricle: The right ventricular size is normal. No increase in right ventricular wall thickness. Right ventricular systolic function is normal. Tricuspid regurgitation signal is inadequate for assessing PA pressure. Left Atrium: Left atrial size was moderately dilated. Right Atrium: Right atrial size was normal in size. Pericardium: There is no evidence of pericardial effusion. Mitral Valve: The mitral valve is normal in structure. No evidence of mitral valve regurgitation. No evidence of mitral valve stenosis. Tricuspid Valve: The tricuspid valve is normal in structure. Tricuspid valve regurgitation is  not demonstrated. No evidence of tricuspid stenosis. Aortic Valve: The aortic valve is calcified. Aortic valve regurgitation is not visualized. Mild to moderate aortic stenosis is present. Aortic valve mean gradient measures 24.5 mmHg. Aortic valve peak gradient measures 35.0 mmHg. Aortic valve area, by VTI measures 1.10 cm. Pulmonic Valve: The pulmonic valve was normal in structure. Pulmonic valve regurgitation is not visualized. No evidence of pulmonic stenosis. Aorta: The aortic root is normal in size and structure. Venous: The inferior vena cava is normal in size with greater than 50% respiratory variability, suggesting right atrial pressure of 3 mmHg. IAS/Shunts: No atrial level shunt detected by color flow Doppler.  LEFT VENTRICLE PLAX 2D LVIDd:         5.50 cm   Diastology LVIDs:         5.20 cm   LV e' medial:    9.14 cm/s LV PW:         1.20 cm   LV E/e' medial:  8.9 LV IVS:        1.10 cm   LV e' lateral:   4.13 cm/s LVOT diam:     1.80 cm   LV E/e' lateral: 19.7 LV SV:         70 LV SV Index:   34 LVOT Area:     2.54 cm  RIGHT VENTRICLE             IVC RV S prime:     12.00 cm/s  IVC diam: 2.50 cm TAPSE (M-mode): 3.4 cm LEFT ATRIUM             Index        RIGHT ATRIUM          Index LA Vol (A2C):   79.7 ml 38.57 ml/m  RA Area:     9.99 cm LA Vol (A4C):   86.3 ml 41.76 ml/m  RA Volume:   17.90 ml 8.66 ml/m  LA Biplane Vol: 87.1 ml 42.15 ml/m  AORTIC VALVE AV Area (Vmax):    1.07 cm AV Area (Vmean):   0.98 cm AV Area (VTI):     1.10 cm AV Vmax:           296.00 cm/s AV Vmean:          242.500 cm/s AV VTI:            0.638 m AV Peak Grad:      35.0 mmHg AV Mean Grad:      24.5 mmHg LVOT Vmax:         125.00 cm/s LVOT Vmean:        93.100 cm/s LVOT VTI:          0.275 m LVOT/AV VTI ratio: 0.43 MITRAL VALVE MV Area (PHT): 4.33 cm     SHUNTS MV Decel Time: 175 msec     Systemic VTI:  0.28 m MV E velocity: 81.20 cm/s   Systemic Diam: 1.80 cm MV A velocity: 133.00 cm/s MV E/A ratio:  0.61 Kardie Tobb  DO Electronically signed by Thomasene Ripple DO Signature Date/Time: 02/01/2023/11:38:32 AM    Final      Medications:     Current Medications:  ALPRAZolam  1 mg Oral Q6H   aspirin  81 mg Oral Daily   busPIRone  5 mg Oral BID   Chlorhexidine Gluconate Cloth  6 each Topical Daily   etomidate       fentaNYL       fentaNYL (SUBLIMAZE) injection  50 mcg Intravenous Once   lidocaine  1 patch Transdermal Q24H   metoprolol succinate  25 mg Oral Daily   metoprolol tartrate  2.5 mg Intravenous Once   midazolam       omega-3 acid ethyl esters  1 g Oral Daily   pantoprazole  40 mg Oral Daily   phenylephrine       potassium chloride  20 mEq Oral Daily   rocuronium bromide       rosuvastatin  20 mg Oral Daily   sodium chloride flush  3 mL Intravenous Q12H   sodium chloride flush  3 mL Intravenous Q12H   sodium chloride flush  3 mL Intravenous Q12H   ticagrelor  90 mg Oral BID    Infusions:  sodium chloride     sodium chloride Stopped (02/01/23 1549)   sodium chloride     sodium chloride     sodium chloride     amiodarone     fentaNYL infusion INTRAVENOUS     heparin 1,000 Units/hr (02/02/23 0649)   lidocaine 2 mg/min (02/02/23 0304)   norepinephrine     norepinephrine (LEVOPHED) Adult infusion     phenylephrine (NEO-SYNEPHRINE) Adult infusion 150 mcg/min (02/02/23 0624)   propofol (DIPRIVAN) infusion 25 mcg/kg/min (02/02/23 0507)   sodium chloride        Patient Profile   67 y.o. female with history of CAD s/p PCI to RCA in 2021, carotid artery stenosis s/p R CEA, aortic valve stenosis. Admitted with unstable angina. Found to have severe LAD stenosis s/p PCI and severe low flow low gradient AS. Coarse c/b refractory VT and hemodynamic instability >> cardiogenic shock.  Assessment/Plan   Acute systolic CHF >> cardiogenic shock -Echo 01/25/23: EF 30-35%, septum and apex HK, RV low normal, low flow low graident severe AS with mean gradient 20 mmHg, AVA 0.93 cm2, DI 0.33 -echo  02/01/23: EF 20-25%, RV okay, severe low flow low gradient severe  AS with mean gradient 25 mmHg, AVA 1.1 cm2 -s/p PCI/stent to LAD. Stent thrombosis on 08/09 treated with thrombectomy.  -Refractory VT last couple of days resulting in hemodynamic instability >> cardiogenic shock -IABP at 1:1 with CO 2/CI 0.9. Lactic acid 1.4. Not currently on any inotropes d/t recurrent VT. Family meeting this am to discuss options for mechanical support including escalating to Impella 5.5 vs VA ECMO with hope of recovery. Would not be a candidate for transplant and not sure she'd be candidate for VAD with comorbidities and reluctance to take medications.  2. Refractory VT -S/p multiple shocks. Now on lidocaine and amiodarone gtt -Keep K > 4 and Mag > 2  3. CAD -HX PCI to RCA in 2021 -s/p PCI/stent to LAD on 08/07. Stent thrombosis 08/09 treated with thrombectomy -LHC 08/12 with patent LAD stent -Continue DAPT. Has been declining statin.  4. LFLG Severe AS -Noted on echo this admit -Not candidate for SAVR -Currently not stable enough for TAVR   Length of Stay: 9  FINCH, LINDSAY N, PA-C  02/02/2023, 7:05 AM  Advanced Heart Failure Team Pager [phone] (M-F; 7a - 5p)  Please contact [facility] for night-coverage after hours (4p -7a ) and weekends on [email or web address]   Agree with above.   Overnight events notes and reviewed in detail through chart and with clinical team.   67 y/o woman with CAD, PAD s/p carotid stent, moderate to severe AS and h/o medication compliance  Admitted with unstable angina found to have 95% LAD and moderate to severe AS with EF 30-35%. Felt not to be surgical candidate. Underwent PCI of LAD with subsequent acute stent thrombosis and repeat angioplasty. While in hospital has been refusing various meds per report.   Overnight developed recurrent VT/VF. Repeat cath with patent LAD stent and preserved CO. Continued with VT. ECMO discussed with shock team but felt to be high risk  with no clear exit strategy. Intubated and IABP placed. Treated with amio and lido.   IABP now in place. However CO remains very low. With CI ~1.0. On neo 150 to maintain MAPs. NE started transiently but had recurrent VT. Suprisingly lactic acid remains normal  Currently sedated on vent. Daughters at bedside  General:  Sedated on vent HEENT: +ETT Neck: supple. RIJ swan Cor: Distant HS mildly irregular. Hard to hear AS Lungs: coarse Abdomen: obese soft, nontender, nondistended. No hepatosplenomegaly. No bruits or masses. Good bowel sounds. Extremities: no cyanosis, clubbing, rash, edema  RFA IABP site ok  Neuro: intubated sedated.   She is critically ill with profound cardiogenic shock in setting of severe ischemic heart disease, low EF, moderate to severe AS and unstable VT/VF.   I think her chances of surviving this are extremely low and I discussed this with her daughters.   We discussed options of  1. ECMO support for several days +/- possible TAVR in an attempt to stabilize. They were clear that she would not want a durable VAD as a potential exit strategy.  2. Careful addition/titration of inotropic support with knowledge that risk of recurrent VT/VF is high requiring more shocks 3. Comfort care- they are not ready for this at this time   We have provided more info on ECMO and they will get back to [place] soon. Of note, impella candidate due to degree of AS. Given bradycardia, overdrive pacing may help suppress VT/VF but may also drive ischemia.   CRITICAL CARE Performed by: Arvilla Meres  Total critical care time: 90 minutes  Critical care time was exclusive of separately billable procedures and treating other patients.  Critical care was necessary to treat or prevent imminent or life-threatening deterioration.  Critical care was time spent personally by me (independent of midlevel providers or residents) on the following activities: development of treatment plan with  patient and/or surrogate as well as nursing, discussions with consultants, evaluation of patient's response to treatment, examination of patient, obtaining history from patient or surrogate, ordering and performing treatments and interventions, ordering and review of laboratory studies, ordering and review of radiographic studies, pulse oximetry and re-evaluation of patient's condition.  Arvilla Meres, MD  8:46 AM

## 2023-02-02 NOTE — Procedures (Signed)
Intubation Procedure Note  Madison West  [id number]  March 15, 1956  Date:02/02/23  Time:4:00 AM   Provider Performing: Gaynell Face    Procedure: Intubation (31500)  Indication(s) Respiratory Failure  Consent Unable to obtain consent due to emergent nature of procedure.   Anesthesia Etomidate, Versed, Fentanyl, and see mar   Time Out Verified patient identification, verified procedure, site/side was marked, verified correct patient position, special equipment/implants available, medications/allergies/relevant history reviewed, required imaging and test results available.   Sterile Technique Usual hand hygeine, masks, and gloves were used   Procedure Description Patient positioned in bed supine.  Sedation given as noted above.  Patient was intubated with endotracheal tube using Glidescope.  View was Grade 1 full glottis .  Number of attempts was 1.  Colorimetric CO2 detector was consistent with tracheal placement.   Complications/Tolerance None; patient tolerated the procedure well. Chest X-ray is ordered to verify placement.   EBL Minimal   Specimen(s) None

## 2023-02-02 NOTE — Anesthesia Preprocedure Evaluation (Addendum)
Anesthesia Evaluation  Patient identified by MRN, date of birth, ID band Patient unresponsive    Reviewed: Allergy & Precautions, H&P , NPO status , Patient's Chart, lab work & pertinent test results, Unable to perform ROS - Chart review only  Airway Mallampati: Intubated  TM Distance: >3 FB Neck ROM: Full    Dental no notable dental hx.    Pulmonary former smoker   Pulmonary exam normal breath sounds clear to auscultation       Cardiovascular hypertension, + CAD, + Past MI and +CHF  Normal cardiovascular exam+ Valvular Problems/Murmurs AS  Rhythm:Regular Rate:Normal  1. Left ventricular ejection fraction, by estimation, is 20 to 25%. The  left ventricle has severely decreased function. The left ventricle  demonstrates regional wall motion abnormalities (see scoring  diagram/findings for description). The left  ventricular internal cavity size was moderately dilated. There is mild  concentric left ventricular hypertrophy. Left ventricular diastolic  parameters are indeterminate.   2. Right ventricular systolic function is normal. The right ventricular  size is normal. Tricuspid regurgitation signal is inadequate for assessing  PA pressure.   3. Left atrial size was moderately dilated.   4. The mitral valve is normal in structure. No evidence of mitral valve  regurgitation. No evidence of mitral stenosis.   5. The aortic valve is calcified. Aortic valve regurgitation is not  visualized. Mild to moderate aortic valve stenosis. Aortic valve area, by  VTI measures 1.10 cm. Aortic valve mean gradient measures 24.5 mmHg.  Aortic valve Vmax measures 2.96 m/s.   6. The inferior vena cava is normal in size with greater than 50%  respiratory variability, suggesting right atrial pressure of 3 mmHg.      Neuro/Psych negative neurological ROS  negative psych ROS   GI/Hepatic negative GI ROS, Neg liver ROS,,,  Endo/Other  negative  endocrine ROS    Renal/GU negative Renal ROS  negative genitourinary   Musculoskeletal negative musculoskeletal ROS (+)    Abdominal   Peds negative pediatric ROS (+)  Hematology negative hematology ROS (+)   Anesthesia Other Findings   Reproductive/Obstetrics negative OB ROS                             Anesthesia Physical Anesthesia Plan  ASA: 5 and emergent  Anesthesia Plan: General   Post-op Pain Management: Minimal or no pain anticipated   Induction: Intravenous  PONV Risk Score and Plan: 3 and Treatment may vary due to age or medical condition  Airway Management Planned: Oral ETT  Additional Equipment: Arterial line and CVP  Intra-op Plan:   Post-operative Plan: Post-operative intubation/ventilation  Informed Consent: I have reviewed the patients History and Physical, chart, labs and discussed the procedure including the risks, benefits and alternatives for the proposed anesthesia with the patient or authorized representative who has indicated his/her understanding and acceptance.     Dental advisory given and Consent reviewed with POA  Plan Discussed with: CRNA  Anesthesia Plan Comments: (Critically ill patient, for [facility] ECM[facility] cannulation. Possible for TAVR on 02/04/23)       Anesthesia Quick Evaluation

## 2023-02-02 NOTE — H&P (View-Only) (Signed)
Recurrent polymorphic VT/VF requiring multiple shocks and intubation for airway protection. Post intubation, she has been hypotensive requiring 75 mic of neosynephrine. Discussed with Dr. Gala Romney. Will plan to place IABP. If further VF/VT or hemodynamic decompensation, will consider ECMO.  CRITICAL CARE Performed by: Truett Mainland   Total critical care time: 60 minutes   Critical care time was exclusive of separately billable procedures and treating other patients.   Critical care was necessary to treat or prevent imminent or life-threatening deterioration.   Critical care was time spent personally by me on the following activities: development of treatment plan with patient and/or surrogate as well as nursing, discussions with consultants, evaluation of patient's response to treatment, examination of patient, obtaining history from patient or surrogate, ordering and performing treatments and interventions, ordering and review of laboratory studies, ordering and review of radiographic studies, pulse oximetry and re-evaluation of patient's condition.      Elder Negus, MD Pager: [phone] Office: [phone]

## 2023-02-02 NOTE — Progress Notes (Signed)
RT assisted with patient transport on vent to Cath Lab. CRNA at bedside for the procedure and will manage the vent and airway.

## 2023-02-02 NOTE — Anesthesia Postprocedure Evaluation (Signed)
Anesthesia Post Note  Patient: Madison West  Procedure(s) Performed: RIGHT HEART CATH TEMPORARY PACEMAKER     Patient location during evaluation: SICU Anesthesia Type: General Level of consciousness: sedated Pain management: pain level controlled Vital Signs Assessment: post-procedure vital signs reviewed and stable Respiratory status: patient remains intubated per anesthesia plan Cardiovascular status: stable Postop Assessment: no apparent nausea or vomiting Anesthetic complications: no  There were no known notable events for this encounter.  Last Vitals:  Vitals:   02/02/23 1641 02/02/23 1704  BP:  104/76  Pulse: (!) 0 80  Resp:  18  Temp:    SpO2:  97%    Last Pain:  Vitals:   02/02/23 1200  TempSrc: Core  PainSc:                  , S

## 2023-02-02 NOTE — Progress Notes (Addendum)
eLink Physician-Brief Progress Note Patient Name: Decima Wigand DOB: [birth year] MRN: [id number]   Date of Service  02/02/2023  HPI/Events of Note  Vfib>shock>pulseless VT requiring shock x 3> CPR> ROSC. No meds given  eICU Interventions  Cardiology and ground team paged   3:00 AM Refractory VT/Torsades. Multiple shocks. Decision for elective intubation for airway protection. Plan for arterial line. Continues to require shocks. Cardiology aware and coming in.  Intervention Category Major Interventions: Code management / supervision   Mechele Collin 02/02/2023, 2:57 AM

## 2023-02-02 NOTE — Progress Notes (Signed)
eLink Physician-Brief Progress Note Patient Name: Delfina Troise DOB: 09-08-1955 MRN: [id number]   Date of Service  02/02/2023  HPI/Events of Note  S/p IABP   eICU Interventions  BMET pending     Intervention Category Minor Interventions: Electrolytes abnormality - evaluation and management   Mechele Collin 02/02/2023, 6:21 AM

## 2023-02-02 NOTE — Progress Notes (Signed)
Transported patient to cath lab while patient was on the ventilator. Patient remained stable during procedure.

## 2023-02-02 NOTE — Telephone Encounter (Addendum)
Patient well-known to me, she was taken care of by my partner in my absence when I was away.  I have personally reviewed all the angiograms, post stent angiography reveals excellent results.  I discussed with Toni Amend who is echocardiographer, I explained to her that I last saw her in March 2024 and an echocardiogram was performed in December 2023 which showed mild to outpost mild to moderate aortic stenosis and preserved LVEF.  She has not called [place] or contacted [place] regarding worsening dyspnea but has been seen by outpatient urgent care 3 or 4 times per daughter for shortness of breath and was told to have pneumonia.  I suspect that she could have had progression of aortic stenosis or she could have had progression of coronary artery disease with high-grade stenosis in the LAD that led to cardiomyopathy that is probably ischemic as evidenced by initial echocardiogram revealing anterolateral wall motion abnormality that is new from previous normal Wall motion noted in December echocardiogram.   I am not completely convinced her aortic stenosis was critical with low-flow low gradient severe aortic stenosis as her dimensionless index was 0.33.  Also by right and left heart catheterization aortic valve stenosis was moderate at most with valve area of 1.0 cm.  Patient developed ACS sometime in the past and or developed worsening CAD that eventually led to acute decompensated heart failure and needing multiple outpatient urgent care visits where she was treated for pneumonia.  This is anecdotal.  My belief is that patient was appropriately treated all along and unfortunately noncompliance with medical advice, noncompliance with medication compliance, led to the present situation where she is in now.  Patient has had significant LV systolic dysfunction, periprocedural MI secondary to acute stent thrombosis, severe LV systolic dysfunction.  In view of recurrent episodes of VT VF, she is in extreme critical  condition and chances of survival beyond hospitalization is very low.  I have explained all this to patient's daughter, I also discussed with her regarding making DNR status and to continue with present aggressive medical therapy, we discussed regarding potential for quality of life and/or anoxic brain injury, organ injury, including but not limited to lungs, kidneys, bleeding complications that can arise were all discussed in detail.  I did advise them that we should continue aggressive medical therapy as we are right now but in the event that she has natural death that includes VT VF, to consider withdrawal of support.  Patient's daughter understood noncompliance to medical advice has been a major issue with the patient.  She will discuss further with the family.

## 2023-02-02 NOTE — CV Procedure (Signed)
Central Venous Catheter Insertion Procedure Note Madison West [id number] 1956/02/03    Procedure: Insertion of Central Venous Catheter Indications: Drug and/or fluid administration   Procedure Details Consent: Risks of procedure as well as the alternatives and risks of each were explained to the (patient/caregiver).  Consent for procedure obtained. Time Out: Verified patient identification, verified procedure, site/side was marked, verified correct patient position, special equipment/implants available, medications/allergies/relevent history reviewed, required imaging and test results available.  Performed   Maximum sterile technique was used including antiseptics, cap, gloves, gown, hand hygiene, mask and sheet. Skin prep: Chlorhexidine; local anesthetic administered A antimicrobial bonded/coated triple lumen catheter was placed in the left internal jugular vein using u/s guidance, a micropuncture kit and a modified Seldinger technique and u/s guidance.   Evaluation Blood flow good Complications: No apparent complications Patient did tolerate procedure well.    Arvilla Meres, MD  5:26 PM

## 2023-02-02 NOTE — Procedures (Signed)
Arterial Catheter Insertion Procedure Note  Madison West  [id number]  1955/09/22  Date:02/02/23  Time:4:00 AM    Provider Performing: Briant Sites    Procedure: Insertion of Arterial Line (40981) with US guidance (19147)   Indication(s) Blood pressure monitoring and/or need for frequent ABGs  Consent Unable to obtain consent due to emergent nature of procedure.  Anesthesia None   Time Out Verified patient identification, verified procedure, site/side was marked, verified correct patient position, special equipment/implants available, medications/allergies/relevant history reviewed, required imaging and test results available.   Sterile Technique Maximal sterile technique including full sterile barrier drape, hand hygiene, sterile gown, sterile gloves, mask, hair covering, sterile ultrasound probe cover (if used).   Procedure Description Area of catheter insertion was cleaned with chlorhexidine and draped in sterile fashion. With real-time ultrasound guidance an arterial catheter was placed into the left radial artery.  Appropriate arterial tracings confirmed on monitor.     Complications/Tolerance None; patient tolerated the procedure well.   EBL Minimal   Specimen(s) None

## 2023-02-02 NOTE — Progress Notes (Signed)
OT Cancellation Note  Patient Details Name: Madison West MRN: [id number] DOB: 01-Dec-1955   Cancelled Treatment:    Reason Eval/Treat Not Completed: Medical issues which prohibited therapy.  Patient with Code Blue, now intubated.  OT will sign off and await new orders as appropriate.     D  02/02/2023, 11:23 AM 02/02/2023  RP, OTR/L  Acute Rehabilitation Services  Office:  [phone]

## 2023-02-02 NOTE — Progress Notes (Signed)
Chaplain responded to 2nd Code Blue.  Pt's l;two daughters present. L One more clearly upset than the other.  Chaplain offered comforting presence and encouragement.  Pt.taken back to Cath Lab.   Chaplain escorted family to family area for 2 hour wait.  Chaplain on standby  [employer]

## 2023-02-02 NOTE — Progress Notes (Signed)
Chaplain responded to Code Blue.  Pt going to Cath Lab.  No family present at this time.  Vernell Morgans Chaplain

## 2023-02-02 NOTE — Plan of Care (Signed)
  Problem: Education: Goal: Knowledge of General Education information will improve Description: Including pain rating scale, medication(s)/side effects and non-pharmacologic comfort measures Outcome: Progressing   Problem: Health Behavior/Discharge Planning: Goal: Ability to manage health-related needs will improve Outcome: Progressing   Problem: Clinical Measurements: Goal: Ability to maintain clinical measurements within normal limits will improve Outcome: Not Progressing   Problem: Clinical Measurements: Goal: Ability to maintain clinical measurements within normal limits will improve Outcome: Not Progressing Goal: Will remain free from infection Outcome: Progressing Goal: Diagnostic test results will improve Outcome: Not Progressing Goal: Respiratory complications will improve Outcome: Not Progressing Goal: Cardiovascular complication will be avoided Outcome: Not Progressing   Problem: Activity: Goal: Risk for activity intolerance will decrease Outcome: Not Progressing   Problem: Nutrition: Goal: Adequate nutrition will be maintained Outcome: Not Progressing   Problem: Coping: Goal: Level of anxiety will decrease Outcome: Not Progressing   Problem: Elimination: Goal: Will not experience complications related to bowel motility Outcome: Progressing Goal: Will not experience complications related to urinary retention Outcome: Not Progressing   Problem: Pain Managment: Goal: General experience of comfort will improve Outcome: Not Progressing   Problem: Safety: Goal: Ability to remain free from injury will improve Outcome: Progressing   Problem: Skin Integrity: Goal: Risk for impaired skin integrity will decrease Outcome: Progressing   Problem: Education: Goal: Understanding of CV disease, CV risk reduction, and recovery process will improve Outcome: Progressing Goal: Individualized Educational Video(s) Outcome: Progressing   Problem: Activity: Goal:  Ability to return to baseline activity level will improve Outcome: Not Progressing   Problem: Cardiovascular: Goal: Ability to achieve and maintain adequate cardiovascular perfusion will improve Outcome: Not Progressing Goal: Vascular access site(s) Level 0-1 will be maintained Outcome: Progressing   Problem: Health Behavior/Discharge Planning: Goal: Ability to safely manage health-related needs after discharge will improve Outcome: Progressing

## 2023-02-02 NOTE — Progress Notes (Signed)
eLink Physician-Brief Progress Note Patient Name: Madison West DOB: June 11, 1956 MRN: [id number]   Date of Service  02/02/2023  HPI/Events of Note  SBP 60s. Patient awake and alert Repeat BP check with SBP 87 (MAP 66)  C/o unchanged chest pain since receiving CPR AAOx3, follows commands  eICU Interventions  NS 250 cc now Place arterial line Levophed ordered if needed. Ok for now     Intervention Category Intermediate Interventions: Hypotension - evaluation and management   Mechele Collin 02/02/2023, 2:44 AM

## 2023-02-03 ENCOUNTER — Inpatient Hospital Stay (HOSPITAL_COMMUNITY): Payer: Medicare Other

## 2023-02-03 DIAGNOSIS — R570 Cardiogenic shock: Secondary | ICD-10-CM | POA: Diagnosis not present

## 2023-02-03 DIAGNOSIS — I350 Nonrheumatic aortic (valve) stenosis: Secondary | ICD-10-CM | POA: Diagnosis not present

## 2023-02-03 LAB — POCT I-STAT 7, (LYTES, BLD GAS, ICA,H+H)
Acid-base deficit: 4 mmol/L — ABNORMAL HIGH (ref 0.0–2.0)
Bicarbonate: 22.6 mmol/L (ref 20.0–28.0)
Calcium, Ion: 1.2 mmol/L (ref 1.15–1.40)
HCT: 31 % — ABNORMAL LOW (ref 36.0–46.0)
Hemoglobin: 10.5 g/dL — ABNORMAL LOW (ref 12.0–15.0)
O2 Saturation: 97 %
Patient temperature: 38.3
Potassium: 4.2 mmol/L (ref 3.5–5.1)
Sodium: 123 mmol/L — ABNORMAL LOW (ref 135–145)
TCO2: 24 mmol/L (ref 22–32)
pCO2 arterial: 47.1 mmHg (ref 32–48)
pH, Arterial: 7.295 — ABNORMAL LOW (ref 7.35–7.45)
pO2, Arterial: 112 mmHg — ABNORMAL HIGH (ref 83–108)

## 2023-02-03 LAB — BASIC METABOLIC PANEL
Anion gap: 10 (ref 5–15)
BUN: 12 mg/dL (ref 8–23)
CO2: 20 mmol/L — ABNORMAL LOW (ref 22–32)
Calcium: 7.6 mg/dL — ABNORMAL LOW (ref 8.9–10.3)
Chloride: 96 mmol/L — ABNORMAL LOW (ref 98–111)
Creatinine, Ser: 0.88 mg/dL (ref 0.44–1.00)
GFR, Estimated: >60 mL/min (ref >60)
Glucose, Bld: 151 mg/dL — ABNORMAL HIGH (ref 70–99)
Potassium: 3.8 mmol/L (ref 3.5–5.1)
Sodium: 126 mmol/L — ABNORMAL LOW (ref 135–145)

## 2023-02-03 LAB — GLUCOSE, CAPILLARY
Glucose-Capillary: 156 mg/dL — ABNORMAL HIGH (ref 70–99)
Glucose-Capillary: 129 mg/dL — ABNORMAL HIGH (ref 70–99)
Glucose-Capillary: 130 mg/dL — ABNORMAL HIGH (ref 70–99)
Glucose-Capillary: 138 mg/dL — ABNORMAL HIGH (ref 70–99)
Glucose-Capillary: 154 mg/dL — ABNORMAL HIGH (ref 70–99)
Glucose-Capillary: 143 mg/dL — ABNORMAL HIGH (ref 70–99)

## 2023-02-03 LAB — HEPARIN LEVEL (UNFRACTIONATED): Heparin Unfractionated: 0.22 [IU]/mL — ABNORMAL LOW (ref 0.30–0.70)

## 2023-02-03 LAB — POCT ACTIVATED CLOTTING TIME: Activated Clotting Time: 122 seconds

## 2023-02-03 LAB — LIDOCAINE LEVEL: Lidocaine Lvl: 5.9 ug/mL — ABNORMAL HIGH (ref 1.5–5.0)

## 2023-02-03 LAB — PROCALCITONIN: Procalcitonin: 0.24 ng/mL

## 2023-02-03 LAB — CG4 I-STAT (LACTIC ACID): Lactic Acid, Venous: 0.4 mmol/L — ABNORMAL LOW (ref 0.5–1.9)

## 2023-02-03 LAB — PHOSPHORUS: Phosphorus: 2.4 mg/dL — ABNORMAL LOW (ref 2.5–4.6)

## 2023-02-03 LAB — MAGNESIUM: Magnesium: 2.1 mg/dL (ref 1.7–2.4)

## 2023-02-03 MED ORDER — POTASSIUM CHLORIDE 10 MEQ/50ML IV SOLN
10.0000 meq | INTRAVENOUS | Status: AC
  Administered 2023-02-03 (×2): 10 meq via INTRAVENOUS
  Filled 2023-02-03 (×2): qty 50

## 2023-02-03 MED ORDER — ALBUMIN HUMAN 5 % IV SOLN
12.5000 g | INTRAVENOUS | Status: DC | PRN
  Administered 2023-02-03: 12.5 g via INTRAVENOUS

## 2023-02-03 MED ORDER — ASPIRIN 81 MG PO CHEW
81.0000 mg | CHEWABLE_TABLET | Freq: Every day | ORAL | Status: DC
  Administered 2023-02-04: 81 mg via ORAL
  Filled 2023-02-03: qty 1

## 2023-02-03 MED ORDER — MAGNESIUM SULFATE 2 GM/50ML IV SOLN
2.0000 g | Freq: Once | INTRAVENOUS | Status: AC
  Administered 2023-02-03: 2 g via INTRAVENOUS
  Filled 2023-02-03: qty 50

## 2023-02-03 MED ORDER — ASPIRIN 81 MG PO CHEW
81.0000 mg | CHEWABLE_TABLET | Freq: Every day | ORAL | Status: DC
  Administered 2023-02-03: 81 mg
  Filled 2023-02-03: qty 1

## 2023-02-03 MED ORDER — PROPOFOL 1000 MG/100ML IV EMUL
5.0000 ug/kg/min | INTRAVENOUS | Status: DC
  Administered 2023-02-03: 20 ug/kg/min via INTRAVENOUS
  Filled 2023-02-03: qty 100

## 2023-02-03 MED ORDER — VANCOMYCIN HCL 2000 MG/400ML IV SOLN
2000.0000 mg | Freq: Once | INTRAVENOUS | Status: AC
  Administered 2023-02-03: 2000 mg via INTRAVENOUS
  Filled 2023-02-03: qty 400

## 2023-02-03 MED ORDER — ALPRAZOLAM 0.5 MG PO TABS
1.0000 mg | ORAL_TABLET | Freq: Four times a day (QID) | ORAL | Status: DC
  Administered 2023-02-03 – 2023-02-05 (×4): 1 mg via ORAL
  Filled 2023-02-03 (×4): qty 2

## 2023-02-03 MED ORDER — TICAGRELOR 90 MG PO TABS
90.0000 mg | ORAL_TABLET | Freq: Two times a day (BID) | ORAL | Status: DC
  Administered 2023-02-03 – 2023-02-04 (×3): 90 mg via ORAL
  Filled 2023-02-03 (×3): qty 1

## 2023-02-03 MED ORDER — CHLORHEXIDINE GLUCONATE 0.12 % MT SOLN
15.0000 mL | Freq: Once | OROMUCOSAL | Status: AC
  Administered 2023-02-04: 15 mL via OROMUCOSAL

## 2023-02-03 MED ORDER — POTASSIUM CHLORIDE 2 MEQ/ML IV SOLN
80.0000 meq | INTRAVENOUS | Status: DC
  Filled 2023-02-03: qty 40

## 2023-02-03 MED ORDER — POTASSIUM CHLORIDE 20 MEQ PO PACK: 40.0000 meq | PACK | Freq: Every day | ORAL | Status: DC

## 2023-02-03 MED ORDER — CALCIUM GLUCONATE-NACL 2-0.675 GM/100ML-% IV SOLN
2.0000 g | Freq: Once | INTRAVENOUS | Status: AC
  Administered 2023-02-03: 2000 mg via INTRAVENOUS
  Filled 2023-02-03: qty 100

## 2023-02-03 MED ORDER — MAGNESIUM SULFATE 50 % IJ SOLN
40.0000 meq | INTRAMUSCULAR | Status: DC
  Filled 2023-02-03: qty 9.85

## 2023-02-03 MED ORDER — FUROSEMIDE 10 MG/ML IJ SOLN: 40.0000 mg | Freq: Once | INTRAMUSCULAR | Status: DC

## 2023-02-03 MED ORDER — CEFAZOLIN SODIUM-DEXTROSE 2-4 GM/100ML-% IV SOLN
2.0000 g | INTRAVENOUS | Status: DC
  Filled 2023-02-03: qty 100

## 2023-02-03 MED ORDER — FAMOTIDINE 20 MG PO TABS
20.0000 mg | ORAL_TABLET | Freq: Two times a day (BID) | ORAL | Status: DC
  Administered 2023-02-03: 20 mg
  Filled 2023-02-03: qty 1

## 2023-02-03 MED ORDER — VANCOMYCIN HCL IN DEXTROSE 1-5 GM/200ML-% IV SOLN
1000.0000 mg | Freq: Two times a day (BID) | INTRAVENOUS | Status: DC
  Administered 2023-02-03 – 2023-02-07 (×9): 1000 mg via INTRAVENOUS
  Filled 2023-02-03 (×8): qty 200

## 2023-02-03 MED ORDER — INSULIN ASPART 100 UNIT/ML IJ SOLN
2.0000 [IU] | INTRAMUSCULAR | Status: DC
  Administered 2023-02-03: 2 [IU] via SUBCUTANEOUS
  Administered 2023-02-03: 4 [IU] via SUBCUTANEOUS
  Administered 2023-02-03 – 2023-02-10 (×27): 2 [IU] via SUBCUTANEOUS
  Administered 2023-02-10 – 2023-02-11 (×2): 4 [IU] via SUBCUTANEOUS
  Administered 2023-02-11 (×3): 2 [IU] via SUBCUTANEOUS
  Administered 2023-02-12: 4 [IU] via SUBCUTANEOUS
  Administered 2023-02-12 – 2023-02-14 (×7): 2 [IU] via SUBCUTANEOUS

## 2023-02-03 MED ORDER — VITAL AF 1.2 CAL PO LIQD
1000.0000 mL | ORAL | Status: DC
  Administered 2023-02-03: 1000 mL

## 2023-02-03 MED ORDER — BUSPIRONE HCL 10 MG PO TABS
5.0000 mg | ORAL_TABLET | Freq: Two times a day (BID) | ORAL | Status: DC
  Administered 2023-02-03: 5 mg
  Filled 2023-02-03: qty 1

## 2023-02-03 MED ORDER — FUROSEMIDE 10 MG/ML IJ SOLN
60.0000 mg | Freq: Once | INTRAMUSCULAR | Status: AC
  Administered 2023-02-03: 60 mg via INTRAVENOUS
  Filled 2023-02-03: qty 6

## 2023-02-03 MED ORDER — CHLORHEXIDINE GLUCONATE 4 % EX SOLN
1.0000 | Freq: Once | CUTANEOUS | Status: AC
  Administered 2023-02-04: 1 via TOPICAL
  Filled 2023-02-03: qty 45

## 2023-02-03 MED ORDER — HEPARIN 30,000 UNITS/1000 ML (OHS) CELLSAVER SOLUTION
Status: DC
  Filled 2023-02-03: qty 1000

## 2023-02-03 MED ORDER — POTASSIUM CHLORIDE 20 MEQ PO PACK
40.0000 meq | PACK | Freq: Every day | ORAL | Status: DC
  Administered 2023-02-03: 40 meq
  Filled 2023-02-03: qty 2

## 2023-02-03 MED ORDER — FAMOTIDINE 20 MG PO TABS
20.0000 mg | ORAL_TABLET | Freq: Two times a day (BID) | ORAL | Status: DC
  Administered 2023-02-03 – 2023-02-04 (×2): 20 mg via ORAL
  Filled 2023-02-03 (×2): qty 1

## 2023-02-03 MED ORDER — DEXMEDETOMIDINE HCL IN NACL 400 MCG/100ML IV SOLN
0.0000 ug/kg/h | INTRAVENOUS | Status: DC
  Administered 2023-02-03 (×3): 0.7 ug/kg/h via INTRAVENOUS
  Administered 2023-02-04: 0.5 ug/kg/h via INTRAVENOUS
  Administered 2023-02-04: .7 ug/kg/h via INTRAVENOUS
  Administered 2023-02-04: 0.4 ug/kg/h via INTRAVENOUS
  Administered 2023-02-04 – 2023-02-05 (×2): 0.7 ug/kg/h via INTRAVENOUS
  Administered 2023-02-05: 0.4 ug/kg/h via INTRAVENOUS
  Administered 2023-02-05: 0.9 ug/kg/h via INTRAVENOUS
  Administered 2023-02-06: 0.5 ug/kg/h via INTRAVENOUS
  Administered 2023-02-06: 0.6 ug/kg/h via INTRAVENOUS
  Administered 2023-02-06: 0.5 ug/kg/h via INTRAVENOUS
  Administered 2023-02-07: 0.2 ug/kg/h via INTRAVENOUS
  Administered 2023-02-07: 0.5 ug/kg/h via INTRAVENOUS
  Administered 2023-02-07: 1.2 ug/kg/h via INTRAVENOUS
  Filled 2023-02-03 (×13): qty 100
  Filled 2023-02-03: qty 200
  Filled 2023-02-03 (×2): qty 100

## 2023-02-03 MED ORDER — NOREPINEPHRINE 4 MG/250ML-% IV SOLN
0.0000 ug/min | INTRAVENOUS | Status: DC
  Filled 2023-02-03: qty 250

## 2023-02-03 MED ORDER — TICAGRELOR 90 MG PO TABS
90.0000 mg | ORAL_TABLET | Freq: Two times a day (BID) | ORAL | Status: DC
  Administered 2023-02-03: 90 mg
  Filled 2023-02-03: qty 1

## 2023-02-03 MED ORDER — BUSPIRONE HCL 10 MG PO TABS
5.0000 mg | ORAL_TABLET | Freq: Two times a day (BID) | ORAL | Status: DC
  Administered 2023-02-03 – 2023-02-04 (×2): 5 mg via ORAL
  Filled 2023-02-03 (×2): qty 1

## 2023-02-03 MED ORDER — DEXMEDETOMIDINE HCL IN NACL 400 MCG/100ML IV SOLN
0.1000 ug/kg/h | INTRAVENOUS | Status: DC
  Filled 2023-02-03: qty 100

## 2023-02-03 MED ORDER — NOREPINEPHRINE 16 MG/250ML-% IV SOLN
0.0000 ug/min | INTRAVENOUS | Status: DC
  Administered 2023-02-03: 5 ug/min via INTRAVENOUS
  Administered 2023-02-04: 11 ug/min via INTRAVENOUS
  Administered 2023-02-05: 5 ug/min via INTRAVENOUS
  Filled 2023-02-03 (×3): qty 250

## 2023-02-03 MED ORDER — ALPRAZOLAM 0.5 MG PO TABS
1.0000 mg | ORAL_TABLET | Freq: Four times a day (QID) | ORAL | Status: DC
  Administered 2023-02-03: 1 mg
  Filled 2023-02-03: qty 2

## 2023-02-03 MED ORDER — SODIUM CHLORIDE 0.9 % IV SOLN
2.0000 g | Freq: Three times a day (TID) | INTRAVENOUS | Status: AC
  Administered 2023-02-03 – 2023-02-09 (×19): 2 g via INTRAVENOUS
  Filled 2023-02-03 (×18): qty 12.5

## 2023-02-03 MED ORDER — PROSOURCE TF20 ENFIT COMPATIBL EN LIQD
60.0000 mL | Freq: Four times a day (QID) | ENTERAL | Status: DC
  Administered 2023-02-03 – 2023-02-05 (×6): 60 mL
  Filled 2023-02-03 (×6): qty 60

## 2023-02-03 MED ORDER — BISACODYL 5 MG PO TBEC: 5.0000 mg | DELAYED_RELEASE_TABLET | Freq: Once | ORAL | Status: DC

## 2023-02-03 MED FILL — Sodium Chloride IV Soln 0.9%: INTRAVENOUS | Qty: 250 | Status: AC

## 2023-02-03 MED FILL — Fentanyl Citrate Preservative Free (PF) Inj 2500 MCG/50ML: INTRAMUSCULAR | Qty: 50 | Status: AC

## 2023-02-03 NOTE — Progress Notes (Signed)
ANTICOAGULATION CONSULT NOTE  Pharmacy Consult for Heparin Indication: IABP Brief A/P: Heparin level subtherapeutic Increase Heparin rate  Allergies  Allergen Reactions   Prednisone Palpitations   Fentanyl Swelling and Other (See Comments)    Makes pt "hyper" feels she is "climbing the walls."  Last time she had a colonoscopy with Fentanyl and Versed she was awake the whole time.   Versed [Midazolam] Swelling and Anxiety    Makes pt "hyper" feels she is "climbing the walls."  Last time she had a colonoscopy with Fentanyl and Versed she was awake the whole time.   Vicodin [Hydrocodone-Acetaminophen] Anxiety    Extreme anxiety.  OK to take oxycodone.    Patient Measurements: Height: 5\' 8"  (172.7 cm) Weight: 96 kg (211 lb 10.3 oz) IBW/kg (Calculated) : 63.9 Heparin Dosing Weight: 85 kg  Vital Signs: Temp: 100.4 F (38 C) (08/14 0030) BP: 104/72 (08/14 0000) Pulse Rate: 181 (08/14 0030)  Labs: Recent Labs    01/31/23 2010 02/01/23 0104 02/01/23 0529 02/02/23 0252 02/02/23 0353 02/02/23 0603 02/02/23 0626 02/02/23 1614 02/02/23 1725 02/03/23 0025  HGB 14.1  --    < > 11.6*   < > 11.5*   < > 11.6* 11.4* 11.3*  HCT 46.0  --    < > 33.9*   < > 33.6*   < > 34.0* 34.4* 32.6*  PLT 433*  --    < > 410*  --  526*  --   --  378 356  HEPARINUNFRC  --   --   --   --   --   --   --   --   --  0.15*  CREATININE 0.98  --    < > 0.67  --  0.73  --   --  0.78  --   TROPONINIHS 172* >24,000*  --   --   --   --   --   --   --   --    < > = values in this interval not displayed.    Estimated Creatinine Clearance: 80.4 mL/min (by C-G formula based on SCr of 0.78 mg/dL).   Assessment: 67 y.o. female with CHF s/p PCI, now with recurrent VT/VF s/p IABP placement for heparin.    Goal of Therapy:  Heparin level 0.2-0.5 units/mL Monitor platelets by anticoagulation protocol: Yes   Plan:  Increase Heparin 1100 units/hr Check heparin level in 8 hours.   Eddie Candle 02/03/2023,1:01 AM

## 2023-02-03 NOTE — Progress Notes (Signed)
ANTICOAGULATION CONSULT NOTE -  Consult  Pharmacy Consult for Heparin Indication: IABP  Allergies  Allergen Reactions   Prednisone Palpitations   Fentanyl Swelling and Other (See Comments)    Makes pt "hyper" feels she is "climbing the walls."  Last time she had a colonoscopy with Fentanyl and Versed she was awake the whole time.   Versed [Midazolam] Swelling and Anxiety    Makes pt "hyper" feels she is "climbing the walls."  Last time she had a colonoscopy with Fentanyl and Versed she was awake the whole time.   Vicodin [Hydrocodone-Acetaminophen] Anxiety    Extreme anxiety.  OK to take oxycodone.    Patient Measurements: Height: 5\' 8"  (172.7 cm) Weight: 103.3 kg (227 lb 11.8 oz) IBW/kg (Calculated) : 63.9 Heparin Dosing Weight: 85 kg  Vital Signs: Temp: 99.5 F (37.5 C) (08/14 1400) Temp Source: Core (08/14 1200) BP: 113/93 (08/14 1400) Pulse Rate: 74 (08/14 1520)  Labs: Recent Labs    01/31/23 2010 02/01/23 0104 02/01/23 0529 02/02/23 0603 02/02/23 0626 02/02/23 1725 02/02/23 1730 02/03/23 0025 02/03/23 0738 02/03/23 0749  HGB 14.1  --    < > 11.5*   < > 11.4* 11.6* 11.3*  --  10.5*  HCT 46.0  --    < > 33.6*   < > 34.4* 34.0* 32.6*  --  31.0*  PLT 433*  --    < > 526*  --  378  --  356  --   --   HEPARINUNFRC  --   --   --   --   --   --   --  0.15* 0.22*  --   CREATININE 0.98  --    < > 0.73  --  0.78  --  0.83  --   --   TROPONINIHS 172* >24,000*  --   --   --   --   --   --   --   --    < > = values in this interval not displayed.    Estimated Creatinine Clearance: 80.5 mL/min (by C-G formula based on SCr of 0.83 mg/dL).   Medical History: Past Medical History:  Diagnosis Date   Anxiety    Arthritis    low back and hip pain intermittent   Breast cancer (HCC) 06/02/07   r breast -surgery ,radiaology. chemotherapy   Carpal tunnel syndrome    right hand   Colon polyps    Complication of anesthesia    Fentanyl, Versed-makes extra hyper,  bradycardia x 1 in PACU, [facility] (08/15/11 cardiology felt neostigmine may have resulted in AV nodal block)    Coronary artery disease    Depression    denies   Dysplasia of vulva    Hypertension    Palpitations    PSVT, s/p adenosine 08/04/16   S/P breast lumpectomy 07/04/07   R breast   S/P radiation therapy 2009    Medications:  Scheduled:   sodium chloride   Intravenous Once   ALPRAZolam  1 mg Oral Q6H   [START ON 02/04/2023] aspirin  81 mg Oral Daily   bisacodyl  5 mg Oral Once   busPIRone  5 mg Oral BID   chlorhexidine  1 Application Topical Once   [START ON 02/04/2023] chlorhexidine  15 mL Mouth/Throat Once   Chlorhexidine Gluconate Cloth  6 each Topical Daily   famotidine  20 mg Oral BID   feeding supplement (PROSource TF20)  60 mL Per Tube QID   fentaNYL (  SUBLIMAZE) injection  50 mcg Intravenous Once   furosemide  60 mg Intravenous Once   insulin aspart  2-6 Units Subcutaneous Q4H   lidocaine  1 patch Transdermal Q24H   [START ON 02/04/2023] magnesium sulfate  40 mEq Other To OR   mouth rinse  15 mL Mouth Rinse Q2H   [START ON 02/04/2023] potassium chloride  40 mEq Oral Daily   [START ON 02/04/2023] potassium chloride  80 mEq Other To OR   rosuvastatin  20 mg Oral Daily   sodium chloride flush  3 mL Intravenous Q12H   sodium chloride flush  3 mL Intravenous Q12H   sodium chloride flush  3 mL Intravenous Q12H   sodium chloride flush  3 mL Intravenous Q12H   ticagrelor  90 mg Oral BID    Assessment: 67 y.o. female with CHF s/p PCI, now with recurrent VT/VF s/p IABP placement for heparin  Heparin drip 1100 u/h with heparin level 0.22 at goal - no bleeding noted CBC stable   Goal of Therapy:  Heparin level 0.2-0.5 units/mL Monitor platelets by anticoagulation protocol: Yes   Plan:  Continue heparin drip 1100 uts/hr  Daily heparin level and cbc  Monitor h/h bleeding    Leota Sauers Pharm.D. CPP, BCPS Clinical Pharmacist [phone] 02/03/2023 4:06 PM

## 2023-02-03 NOTE — Progress Notes (Signed)
Patient seen on evening ICU rounds  Remains intubated/sedated.   On vent at 60% (down from 80%). Will arouse and follow commands  Abx started earlier in the day. At that time pressor requirements were going up and cardiac output high on swan  This evening, pressor requirements back down and cardiac output has normalized with CI running 2.2-2.4 range.   Patient now afebrile.   Discussed the case at length with the Structural team and family. Echo and cath films reviewed again personally.  Given severe LV dysfunction and significant CAD with ischemic VT/VF, I think proceeding with TAVR - though high risk - will be important to help liberate her from IABP and inotrope support and may, in fact, be her only chance to survive this.   Her family is aware of the procedural risk - and the fact that even if the TAVR goes well she may still not have a favorable outcome - but they are willing to proceed given their mother's strong will to survive. This evening she appears hemodynamically optimized and if she is able to maintain her condition overnight, I feel strongly that we should proceed with TAVR tomorrow.   Total time spent 60 minutes. Over half that time spent discussing above.    Arvilla Meres, MD  11:20 PM

## 2023-02-03 NOTE — Progress Notes (Signed)
NAME:  Madison West, MRN:  [id number], DOB:  12-12-1955, LOS: 10 ADMISSION DATE:  01/23/2023, CONSULTATION DATE:  02/03/2023  REFERRING MD:  TRH, pokhrel, CHIEF COMPLAINT:  VT arrest   History of Present Illness:  67 yo F w/ pertinent PMH PSVT, HTN, HLD, R carotid artery stenosis s/p endarterectomy 2020, CAD s/p stenting 2021, moderate aortic stenosis presents to [facility] ED on 8/9 w/ SOB.   Patient has been having SOB that has been getting progressively worse. Patient states she has been gaining weight and feels fatigued. Patient also having some anginal chest pain similar to when she needed coronary interventions back in 2021. Patient admitted to [facility] on 8/9 and treated for new onset CHF. Echo showing LVEF 30-35%; regional wall motion abnormalities; moderate aortic stenosis. BNP 462. EKG showing PVC, LAE, LAFB w/ miniaml ST elevation. Patient underwent cardiac cath on 8.7 showing mid LAD 95% lesion w/ LAD stent placement. CT surgery consulted for severe aortic stenosis. Patient has been having episodes of non sustained v tach started on metoprolol.    On 8/11, patient on floors and had vtach w/ no pulse. CPR initiated, given epi, and shocked x2 with ROSC achieved. Patient alert after cpr event but slightly confused. Patient again went into Vtach rhythm and was shocked again. Given amio, mag, calcium. Patient started on amio drip. Patient transferred to [facility] ICU and cardiology consulted. EKG showing ST elevation in anterior leads. Code STEMI. PCCM consulted.  Pertinent  Medical History  PSVT, anxiety/depression,  breast cancer status post lumpectomy, chemo and radiation in 2009, hypertension,  hyperlipidemia,  right carotid artery stenosis status post carotid endarterectomy in 2020, CAD status post stent to LAD 8/7  Significant Hospital Events: Including procedures, antibiotic start and stop dates in addition to other pertinent events   01/31/2023 for left heart catheterization intervention on the  mid LAD. 8/13 overnight patient developed refractory VT shocked multiple times taken back to Cath Lab, intra-aortic balloon pump placed.  Stabilized intolerant of inotropes due to recurrent VT on amiodarone lidocaine and Neo-Synephrine. Was taken for consideration of VAECMO and then the decision was to not cannulate   Interim History / Subjective:   Now febrile. Remains on multiple pressors   Objective   Blood pressure (!) 120/107, pulse 77, temperature (!) 100.8 F (38.2 C), resp. rate 18, height 5\' 8"  (1.727 m), weight 103.3 kg, SpO2 97%. PAP: (37-58)/(18-33) 41/18 CVP:  [10 mmHg-26 mmHg] 10 mmHg PCWP:  [22 mmHg] 22 mmHg CO:  [4 L/min-4.6 L/min] 4.2 L/min CI:  [2.08 L/min/m2-2.38 L/min/m2] 2.22 L/min/m2  Vent Mode: PRVC FiO2 (%):  [60 %-80 %] 80 % Set Rate:  [18 bmp] 18 bmp Vt Set:  [560 mL] 560 mL PEEP:  [5 cmH20] 5 cmH20 Plateau Pressure:  [17 cmH20-19 cmH20] 17 cmH20   Intake/Output Summary (Last 24 hours) at 02/03/2023 1324 Last data filed at 02/03/2023 0600 Gross per 24 hour  Intake 3779.48 ml  Output 2540 ml  Net 1239.48 ml   Filed Weights   02/01/23 0500 02/02/23 0700 02/03/23 0630  Weight: 93.1 kg 96 kg 103.3 kg    Examination: Gen. critically ill elderly female intubated on mechanical life support, intra-aortic balloon pump in place on multiple pressors ENT NCAT, tracking appropriately, green drainage from nose  Neck: No JVD, right Cordis and Swan in place Lungs: Bilateral mechanically ventilated breath sounds Cardiovascular: Regular rate rhythm, S1-S2 Abdomen: Soft, nontender nondistended Musculoskeletal: No significant edema Neuro: Alert oriented following commands no focal deficit  Presenting: EKG shows ST elevations V1-V3 Rhythm on monitor initially showed runs of VT but then settled down into sinus tachycardia, bigeminy rhythm  Labs show mild leukocytosis, stable hemoglobin  Resolved Hospital Problem list     Assessment & Plan:   Refractory VT,  cardiogenic shock VT arrest in the setting of recent LAD stent, EKG showing anterior wall STEMI, likely in-stent restenosis Acute coronary syndrome, taken to the Cath Lab with successful percutaneous intervention on the mid LAD Plan: Patient remains on amiodarone and lidocaine intra-aortic balloon pump On multiple vasopressors Thankfully still making good urine. Was given additional dose of Lasix today. On infusions with Versed and fentanyl for sedation however she is still wide-awake. Will add propofol which I think will help with some of her ectopy. Hopefully we can come down on her Versed infusion. Appreciate assistance and help in management of this complex patient with the advanced heart failure service.  Fevers Plan: Broad-spectrum antibiotics Cultures Culture drainage from nose   Chest wall pain, s/p CPR  P: PRN Oxy  Acute systolic heart failure -EF 30-35% with akinetic apex and septum on echocardiogram   Severe AS -Ongoing discussion of timing of potential correction of AS with TAVR.  Acute hypoxic respiratory failure, resolved   Hypertension -on losartan and amlodipine Severe anxiety -on BuSpar and Xanax  Hyperglycemia  P: Recheck hyperglycemia    Best Practice (right click and "Reselect all SmartList Selections" daily)   Diet/type: NPO DVT prophylaxis: systemic heparin GI prophylaxis: N/A Lines: N/A Foley:  N/A Code Status:  full code Last date of multidisciplinary goals of care discussion [NA] daughter updated by code team  Labs   CBC: Recent Labs  Lab 02/01/23 0529 02/02/23 0004 02/02/23 0252 02/02/23 0353 02/02/23 0603 02/02/23 0626 02/02/23 1614 02/02/23 1725 02/02/23 1730 02/03/23 0025 02/03/23 0749  WBC 12.8*  --  18.2*  --  17.2*  --   --  14.6*  --  13.2*  --   HGB 13.0   < > 11.6*   < > 11.5*   < > 11.6* 11.4* 11.6* 11.3* 10.5*  HCT 38.5   < > 33.9*   < > 33.6*   < > 34.0* 34.4* 34.0* 32.6* 31.0*  MCV 96.5  --  92.9  --  93.1  --    --  93.2  --  94.5  --   PLT 442*  --  410*  --  526*  --   --  378  --  356  --    < > = values in this interval not displayed.    Basic Metabolic Panel: Recent Labs  Lab 02/01/23 0529 02/01/23 2053 02/02/23 0004 02/02/23 0252 02/02/23 0353 02/02/23 0603 02/02/23 0626 02/02/23 1614 02/02/23 1724 02/02/23 1725 02/02/23 1730 02/03/23 0025 02/03/23 0749  NA 130*  --    < > 123*   < > 123*   < > 126*  --  123* 124* 125* 123*  K 4.2 4.6   < > 3.9   < > 4.0   < > 3.9  --  3.7 3.8 3.9 4.2  CL 98  --   --  89*  --  94*  --   --   --  93*  --  93*  --   CO2 23  --   --  23  --  22  --   --   --  20*  --  21*  --   GLUCOSE 151*  --   --  173*  --  147*  --   --   --  161*  --  144*  --   BUN 18  --   --  15  --  14  --   --   --  17  --  15  --   CREATININE 0.68  --   --  0.67  --  0.73  --   --   --  0.78  --  0.83  --   CALCIUM 8.8*  --   --  8.3*  --  8.7*  --   --   --  7.8*  --  7.9*  --   MG 2.2 1.8  --  2.9*  --  2.7*  --   --  2.3  --   --  1.8  --   PHOS  --   --   --   --   --  3.7  --   --   --   --   --   --   --    < > = values in this interval not displayed.   GFR: Estimated Creatinine Clearance: 80.5 mL/min (by C-G formula based on SCr of 0.83 mg/dL). Recent Labs  Lab 02/02/23 0252 02/02/23 0559 02/02/23 0603 02/02/23 0818 02/02/23 1725 02/02/23 1732 02/03/23 0025 02/03/23 0755  WBC 18.2*  --  17.2*  --  14.6*  --  13.2*  --   LATICACIDVEN  --  1.4  --  0.8  --  0.5  --  0.4*    Liver Function Tests: Recent Labs  Lab 02/02/23 0252  AST 180*  ALT 90*  ALKPHOS 39  BILITOT 0.6  PROT 5.4*  ALBUMIN 3.1*   No results for input(s): "LIPASE", "AMYLASE" in the last 168 hours. No results for input(s): "AMMONIA" in the last 168 hours.  ABG    Component Value Date/Time   PHART 7.295 (L) 02/03/2023 0749   PCO2ART 47.1 02/03/2023 0749   PO2ART 112 (H) 02/03/2023 0749   HCO3 22.6 02/03/2023 0749   TCO2 24 02/03/2023 0749   ACIDBASEDEF 4.0 (H) 02/03/2023  0749   O2SAT 97 02/03/2023 0749     Coagulation Profile: No results for input(s): "INR", "PROTIME" in the last 168 hours.  Cardiac Enzymes: No results for input(s): "CKTOTAL", "CKMB", "CKMBINDEX", "TROPONINI" in the last 168 hours.  HbA1C: Hgb A1c MFr Bld  Date/Time Value Ref Range Status  02/01/2023 05:29 AM 5.6 4.8 - 5.6 % Final    Comment:    (NOTE)         Prediabetes: 5.7 - 6.4         Diabetes: >6.4         Glycemic control for adults with diabetes: <7.0   10/15/2017 04:24 PM 5.8 (H) 4.8 - 5.6 % Final    Comment:             Prediabetes: 5.7 - 6.4          Diabetes: >6.4          Glycemic control for adults with diabetes: <7.0     CBG: Recent Labs  Lab 02/02/23 1731 02/02/23 2019 02/03/23 0025 02/03/23 0423 02/03/23 0752  GLUCAP 173* 123* 156* 129* 130*    This patient is critically ill with multiple organ system failure; which, requires frequent high complexity decision making, assessment, support, evaluation, and titration of therapies. This was completed through the application of advanced monitoring technologies and extensive interpretation of  multiple databases. During this encounter critical care time was devoted to patient care services described in this note for 32 minutes.   Josephine Igo, DO [facility] 02/03/2023 8:23 AM

## 2023-02-03 NOTE — Progress Notes (Signed)
Pharmacy Antibiotic Note  Madison West is a 67 y.o. female admitted on 01/23/2023 s/p IABP/DES/planning TAVR with new fever today 101 wbc 13 PCT0.25 and green mucus drainage from resp tract - sputum and Bcx drawn. Cr0.8 crcl appr28ml/min  Pharmacy has been consulted for vancomiycn and cefepime  dosing.  Plan: Vancomcin 2gm x1 then 1gm IV q12h Cefepime 2gm IV q8h  Height: 5\' 8"  (172.7 cm) Weight: 103.3 kg (227 lb 11.8 oz) IBW/kg (Calculated) : 63.9  Temp (24hrs), Avg:100.1 F (37.8 C), Min:98.6 F (37 C), Max:100.9 F (38.3 C)  Recent Labs  Lab 02/01/23 0529 02/02/23 0011 02/02/23 0252 02/02/23 0559 02/02/23 0603 02/02/23 0818 02/02/23 1725 02/02/23 1732 02/03/23 0025 02/03/23 0755  WBC 12.8*  --  18.2*  --  17.2*  --  14.6*  --  13.2*  --   CREATININE 0.68  --  0.67  --  0.73  --  0.78  --  0.83  --   LATICACIDVEN 1.3 1.5  --  1.4  --  0.8  --  0.5  --  0.4*    Estimated Creatinine Clearance: 80.5 mL/min (by C-G formula based on SCr of 0.83 mg/dL).    Allergies  Allergen Reactions   Prednisone Palpitations   Fentanyl Swelling and Other (See Comments)    Makes pt "hyper" feels she is "climbing the walls."  Last time she had a colonoscopy with Fentanyl and Versed she was awake the whole time.   Versed [Midazolam] Swelling and Anxiety    Makes pt "hyper" feels she is "climbing the walls."  Last time she had a colonoscopy with Fentanyl and Versed she was awake the whole time.   Vicodin [Hydrocodone-Acetaminophen] Anxiety    Extreme anxiety.  OK to take oxycodone.    Antimicrobials this admission:   Dose adjustments this admission:   Microbiology results: 8/14 BCx:  8/14 Sputum:   Leota Sauers Pharm.D. CPP, BCPS Clinical Pharmacist [phone] 02/03/2023 4:45 PM

## 2023-02-03 NOTE — Plan of Care (Signed)
  Problem: Activity: Goal: Ability to return to baseline activity level will improve Outcome: Not Progressing   Problem: Cardiovascular: Goal: Ability to achieve and maintain adequate cardiovascular perfusion will improve Outcome: Progressing Goal: Vascular access site(s) Level 0-1 will be maintained Outcome: Progressing   Problem: Cardiac: Goal: Ability to achieve and maintain adequate cardiopulmonary perfusion will improve Outcome: Progressing   Problem: Respiratory: Goal: Will regain and/or maintain adequate ventilation Outcome: Progressing

## 2023-02-03 NOTE — Progress Notes (Addendum)
Advanced Heart Failure Rounding Note  PCP-Cardiologist: None   Subjective:    IABP at 1:1. On milrinone 0.25 + 4 NE. CO-OX 70%.   SWAN #s CVP 7 PA 48/26 CO 4.2 CI 2.2  Remains on amiodarone at 60/hr + lidocaine at 2/hr. No recurrent VT, but very frequent PVCs overnight.  Now with fevers, T 100.41F this am.  On vent, FiO2 80%. Wakes up and follows commands. Appears anxious.      Objective:   Weight Range: 103.3 kg Body mass index is 34.63 kg/m.   Vital Signs:   Temp:  [97.2 F (36.2 C)-100.9 F (38.3 C)] 100.8 F (38.2 C) (08/14 0630) Pulse Rate:  [0-229] 134 (08/14 0300) Resp:  [13-24] 16 (08/14 0630) BP: (91-131)/(46-98) 118/70 (08/14 0600) SpO2:  [89 %-100 %] 96 % (08/14 0630) Arterial Line BP: (93-166)/(21-87) 119/55 (08/14 0630) FiO2 (%):  [60 %-80 %] 80 % (08/14 0348) Weight:  [96 kg-103.3 kg] 103.3 kg (08/14 0630) Last BM Date : 02/01/23  Weight change: Filed Weights   02/01/23 0500 02/02/23 0700 02/03/23 0630  Weight: 93.1 kg 96 kg 103.3 kg    Intake/Output:   Intake/Output Summary (Last 24 hours) at 02/03/2023 0656 Last data filed at 02/03/2023 0600 Gross per 24 hour  Intake 4082.77 ml  Output 2640 ml  Net 1442.77 ml      Physical Exam    General:  Critically ill appearing.  HEENT: + ETT Neck: supple. R internal jugular SWAN. L internal jugular CVC.  Cor: PMI nondisplaced. Regular rate & rhythm. No rubs, gallops or murmurs. Lungs: clear Abdomen: soft, nontender, nondistended.  Extremities: no cyanosis, clubbing, rash, edema Neuro: Awake on vent    Telemetry   SR 80s, PVCs in trigeminy pattern  Labs    CBC Recent Labs    02/02/23 1725 02/02/23 1730 02/03/23 0025  WBC 14.6*  --  13.2*  HGB 11.4* 11.6* 11.3*  HCT 34.4* 34.0* 32.6*  MCV 93.2  --  94.5  PLT 378  --  356   Basic Metabolic Panel Recent Labs    16/10/96 0603 02/02/23 0626 02/02/23 1724 02/02/23 1725 02/02/23 1730 02/03/23 0025  NA 123*   < >  --   123* 124* 125*  K 4.0   < >  --  3.7 3.8 3.9  CL 94*  --   --  93*  --  93*  CO2 22  --   --  20*  --  21*  GLUCOSE 147*  --   --  161*  --  144*  BUN 14  --   --  17  --  15  CREATININE 0.73  --   --  0.78  --  0.83  CALCIUM 8.7*  --   --  7.8*  --  7.9*  MG 2.7*  --  2.3  --   --  1.8  PHOS 3.7  --   --   --   --   --    < > = values in this interval not displayed.   Liver Function Tests Recent Labs    02/02/23 0252  AST 180*  ALT 90*  ALKPHOS 39  BILITOT 0.6  PROT 5.4*  ALBUMIN 3.1*   No results for input(s): "LIPASE", "AMYLASE" in the last 72 hours. Cardiac Enzymes No results for input(s): "CKTOTAL", "CKMB", "CKMBINDEX", "TROPONINI" in the last 72 hours.  BNP: BNP (last 3 results) Recent Labs    01/23/23 2208  BNP 462.3*  ProBNP (last 3 results) No results for input(s): "PROBNP" in the last 8760 hours.   D-Dimer No results for input(s): "DDIMER" in the last 72 hours. Hemoglobin A1C Recent Labs    02/01/23 0529  HGBA1C 5.6   Fasting Lipid Panel No results for input(s): "CHOL", "HDL", "LDLCALC", "TRIG", "CHOLHDL", "LDLDIRECT" in the last 72 hours. Thyroid Function Tests No results for input(s): "TSH", "T4TOTAL", "T3FREE", "THYROIDAB" in the last 72 hours.  Invalid input(s): "FREET3"  Other results:   Imaging    CARDIAC CATHETERIZATION  Result Date: 02/02/2023 Images from the original result were not included. Finals set of RHC numbers on milrinone 0.375 mics, norepinephrine at 8 mic/min CO: 3.5 L/min CI: 1.7 L/min/m2 Elder Negus, MD Pager: [phone] Office: [phone]  DG Abd 1 View  Result Date: 02/02/2023 CLINICAL DATA:  Orogastric tube placement. EXAM: ABDOMEN - 1 VIEW COMPARISON:  CT 01/28/2023 FINDINGS: Limited field of view for tube placement evaluation. An enteric tube has been placed with tip projecting in the mid abdomen to the right of midline consistent with location in the distal stomach. There is evidence of left pleural  effusion with basilar atelectasis or infiltration. IMPRESSION: Enteric tube tip projects over the right mid abdomen consistent with location in the distal stomach. Electronically Signed   By: Burman Nieves M.D.   On: 02/02/2023 17:46   DG CHEST PORT 1 VIEW  Result Date: 02/02/2023 CLINICAL DATA:  [id number] Dyspnea [id number] EXAM: PORTABLE CHEST - 1 VIEW COMPARISON:  02/01/2023 FINDINGS: Endotracheal tube has been placed, tip 5.3 cm above carina. Right IJ central line is been placed to the right pulmonary artery. IABP tip projects just below the aortic arch. Low lung volumes with patchy infiltrate or atelectasis at the left lung base as before. Heart size and mediastinal contours are within normal limits. Aortic Atherosclerosis (ICD10-170.0). Probable small left pleural effusion stable. Surgical clips right axilla. IMPRESSION: 1. Support devices as above. 2. Persistent left basilar infiltrate or atelectasis and small left effusion. Electronically Signed   By: Corlis Leak M.D.   On: 02/02/2023 07:50     Medications:     Scheduled Medications:  sodium chloride   Intravenous Once   ALPRAZolam  1 mg Oral Q6H   aspirin  81 mg Oral Daily   busPIRone  5 mg Oral BID   Chlorhexidine Gluconate Cloth  6 each Topical Daily   fentaNYL (SUBLIMAZE) injection  50 mcg Intravenous Once   furosemide  60 mg Intravenous Once   lidocaine  1 patch Transdermal Q24H   omega-3 acid ethyl esters  1 g Oral Daily   mouth rinse  15 mL Mouth Rinse Q2H   pantoprazole (PROTONIX) IV  40 mg Intravenous Q24H   potassium chloride  20 mEq Oral Daily   rosuvastatin  20 mg Oral Daily   sodium chloride flush  3 mL Intravenous Q12H   sodium chloride flush  3 mL Intravenous Q12H   sodium chloride flush  3 mL Intravenous Q12H   sodium chloride flush  3 mL Intravenous Q12H   ticagrelor  90 mg Oral BID    Infusions:  sodium chloride     sodium chloride Stopped (02/03/23 0524)   sodium chloride     sodium chloride     sodium  chloride     sodium chloride     amiodarone 60 mg/hr (02/03/23 0600)   fentaNYL infusion INTRAVENOUS 200 mcg/hr (02/03/23 0600)   heparin 1,100 Units/hr (02/03/23 0616)   lidocaine 2 mg/min (02/03/23  0600)   midazolam 10 mg/hr (02/03/23 0600)   milrinone 0.25 mcg/kg/min (02/03/23 0600)   norepinephrine (LEVOPHED) Adult infusion 4 mcg/min (02/03/23 0600)   phenylephrine (NEO-SYNEPHRINE) Adult infusion Stopped (02/02/23 1459)   sodium chloride      PRN Medications: sodium chloride, sodium chloride, sodium chloride, sodium chloride, Place/Maintain arterial line **AND** sodium chloride, acetaminophen, alum & mag hydroxide-simeth, calcium carbonate, fentaNYL, lidocaine, morphine injection, nitroGLYCERIN, ondansetron (ZOFRAN) IV, oxyCODONE, sodium chloride flush, sodium chloride flush, sodium chloride flush, sodium chloride flush    Patient Profile    67 y.o. female with history of CAD s/p PCI to RCA in 2021, carotid artery stenosis s/p R CEA, aortic valve stenosis. Admitted with unstable angina. Found to have severe LAD stenosis s/p PCI and severe low flow low gradient AS. Coarse c/b refractory VT and hemodynamic instability >> cardiogenic shock.    Assessment/Plan   Acute systolic CHF >> cardiogenic shock -In setting of severe ischemic heart disease, reduced EF, severe AS and refractory VT/VF -Echo 01/25/23: EF 30-35%, septum and apex HK, RV low normal, low flow low graident severe AS with mean gradient 20 mmHg, AVA 0.93 cm2, DI 0.33 -echo 02/01/23: EF 20-25%, RV okay, severe low flow low gradient severe AS with mean gradient 25 mmHg, AVA 1.1 cm2 -s/p PCI/stent to LAD. Stent thrombosis on 08/09 treated with thrombectomy.  -IABP placed 08/13. Had considered VA ECMO but hemodynamics improved with inotrope support -IABP at 1:1. Continues on 0.25 milrinone + 4 NE. CI 2.2.  No VT overnight but very frequent PVCs - CVP 7-8. Making good urine but net +. Give 60 mg lasix IV.  2. Refractory  VT -S/p multiple shocks.  -Now on lidocaine and amiodarone gtts -K 3.9 and Mag 1.8. Supp aggressively.    3. CAD NSTEMI -HX PCI to RCA in 2021 -Presented with unstable angina. S/p PCI/stent to LAD on 08/07. Stent thrombosis 08/09 treated with thrombectomy -HS troponin > 24K on 08/12 -LHC 08/12 with patent LAD stent -Continue DAPT. Had been declining statin.   4. LFLG Severe AS -Noted on echo this admit -Not candidate for SAVR -Has been seen by structural heart team. Planning for TAVR on 08/15.   5. Carotid artery stenosis -S/p R CEA in 2020  6. ID -Developed fever overnight. T max 100.71F.  -Empiric abx -Check BC X 2 and procalcitonin  7. Respiratory failure -Vent management per CCM -Awake on vent. Anxious. Takes xanax at home. On fentanyl and versed. Will discuss sedation with CCM.   8. Hyponatremia -Hypervolemic -Na 125, give Tolvaptan if drops further    Length of Stay: 10  FINCH, LINDSAY N, PA-C  02/03/2023, 6:56 AM  Advanced Heart Failure Team Pager [phone] (M-F; 7a - 5p)  Please contact [facility] for night-coverage after hours (5p -7a ) and weekends on [email or web address]  Agree with above.  Remains intubated/sedated On IABP 1:1. Remains on milrinone/NE. Cardiac output much improved.   Febrile to 101 with green sputum  General:  Intubated/sedated HEENT: + ETT Neck: supple. RIJ swan  Carotids 2+ bilat; no bruits. No lymphadenopathy or thryomegaly appreciated. Cor: Irreg Lungs: coarse Abdomen: obese soft, nontender, nondistended. No hepatosplenomegaly. No bruits or masses. Good bowel sounds. Extremities: no cyanosis, clubbing, rash, 1+ edema Neuro: intubated/sedated  Remains critically ill but outputs improved on current regimen. Febrile overnight with greenish sputum. Start broad spectrum abx. Hopefully can get to TAVR soon.   Family update at bedside.   CRITICAL CARE Performed by: Arvilla Meres  Total critical care  time: 40 minutes  Critical  care time was exclusive of separately billable procedures and treating other patients.  Critical care was necessary to treat or prevent imminent or life-threatening deterioration.  Critical care was time spent personally by me (independent of midlevel providers or residents) on the following activities: development of treatment plan with patient and/or surrogate as well as nursing, discussions with consultants, evaluation of patient's response to treatment, examination of patient, obtaining history from patient or surrogate, ordering and performing treatments and interventions, ordering and review of laboratory studies, ordering and review of radiographic studies, pulse oximetry and re-evaluation of patient's condition.  Arvilla Meres, MD  3:33 PM

## 2023-02-03 NOTE — Progress Notes (Addendum)
[facility]   MULTIDISCIPLINARY HEART VALVE TEAM  Patient Name: Madison West Date of Encounter: 02/03/2023  Admit date: 01/23/2023  Primary Care Provider: [name], MD [facility] Cardiologist: Dr. Jacinto Halim / Patwatdhan   [facility] Electrophysiologist:  None   Hospital Problem List     Principal Problem:   Cardiogenic shock [facility]) Active Problems:   Hypertension   Anxiety   Unstable angina (HCC)   Coronary artery disease involving native coronary artery of native heart with unstable angina pectoris (HCC)   Pure hypercholesterolemia   CHF (congestive heart failure) (HCC)   Atherosclerotic heart disease   Acute HFrEF (heart failure with reduced ejection fraction) (HCC)   Nonrheumatic aortic valve stenosis   Former smoker   Hypertensive heart disease with acute systolic congestive heart failure (HCC)   Bilateral carotid artery stenosis   HFrEF (heart failure with reduced ejection fraction) (HCC)   ST elevation myocardial infarction involving left anterior descending (LAD) coronary artery (HCC)   Coronary stent thrombosis   Torsades de pointes (HCC)   Ventricular fibrillation (HCC)     Subjective   Intubated and sedated currently but previously was awake and following commands.  Daughters at bedside. Given pamphlets about TAVR.   Inpatient Medications    Scheduled Meds:  sodium chloride   Intravenous Once   ALPRAZolam  1 mg Oral Q6H   [START ON 02/04/2023] aspirin  81 mg Oral Daily   busPIRone  5 mg Oral BID   Chlorhexidine Gluconate Cloth  6 each Topical Daily   famotidine  20 mg Oral BID   fentaNYL (SUBLIMAZE) injection  50 mcg Intravenous Once   furosemide  60 mg Intravenous Once   insulin aspart  2-6 Units Subcutaneous Q4H   lidocaine  1 patch Transdermal Q24H   mouth rinse  15 mL Mouth Rinse Q2H   [START ON 02/04/2023] potassium chloride  40 mEq Oral Daily   rosuvastatin  20 mg Oral Daily   sodium chloride flush  3 mL Intravenous  Q12H   sodium chloride flush  3 mL Intravenous Q12H   sodium chloride flush  3 mL Intravenous Q12H   sodium chloride flush  3 mL Intravenous Q12H   ticagrelor  90 mg Oral BID   Continuous Infusions:  sodium chloride     sodium chloride 10 mL/hr at 02/03/23 1000   sodium chloride     sodium chloride     sodium chloride     sodium chloride     albumin human 12.5 g (02/03/23 1000)   amiodarone 60 mg/hr (02/03/23 1000)   ceFEPime (MAXIPIME) IV Stopped (02/03/23 5284)   dexmedetomidine (PRECEDEX) IV infusion 0.7 mcg/kg/hr (02/03/23 1000)   fentaNYL infusion INTRAVENOUS 300 mcg/hr (02/03/23 1000)   heparin Stopped (02/03/23 0953)   lidocaine 2 mg/min (02/03/23 1000)   midazolam 12 mg/hr (02/03/23 1000)   milrinone 0.25 mcg/kg/min (02/03/23 1000)   norepinephrine (LEVOPHED) Adult infusion 18 mcg/min (02/03/23 1000)   propofol (DIPRIVAN) infusion Stopped (02/03/23 0846)   sodium chloride     PRN Meds: sodium chloride, sodium chloride, sodium chloride, sodium chloride, Place/Maintain arterial line **AND** sodium chloride, acetaminophen, albumin human, alum & mag hydroxide-simeth, calcium carbonate, fentaNYL, lidocaine, morphine injection, nitroGLYCERIN, ondansetron (ZOFRAN) IV, oxyCODONE, sodium chloride flush, sodium chloride flush, sodium chloride flush, sodium chloride flush   Vital Signs    Vitals:   02/03/23 0853 02/03/23 0900 02/03/23 0924 02/03/23 1000  BP:  101/70  (!) 84/60  Pulse: (!) 152 Marland Kitchen)  148 (!) 173 (!) 205  Resp: 18 18 18 18   Temp: (!) 100.4 F (38 C) (!) 100.4 F (38 C) (!) 100.4 F (38 C) 100.2 F (37.9 C)  TempSrc:      SpO2: 98% 98% 99% 99%  Weight:      Height:        Intake/Output Summary (Last 24 hours) at 02/03/2023 1059 Last data filed at 02/03/2023 1000 Gross per 24 hour  Intake 4317.86 ml  Output 3080 ml  Net 1237.86 ml   Filed Weights   02/01/23 0500 02/02/23 0700 02/03/23 0630  Weight: 93.1 kg 96 kg 103.3 kg    Physical Exam    General:  intubated and sedated. HEENT: intubated Lymph: no adenopathy Neck: no JVD, R internal jugular swan. Cardiac:  normal S1, S2; RRR; 3/6 harsh SEM Lungs: bilateral mechanically ventilated breath sounds  Abd: soft, nontender, no hepatomegaly  Ext: no edema Musculoskeletal:  No deformities, BUE and BLE strength normal and equal Skin: warm and dry  Neuro:  sedated on vent  Labs    CBC Recent Labs    02/02/23 1725 02/02/23 1730 02/03/23 0025 02/03/23 0749  WBC 14.6*  --  13.2*  --   HGB 11.4*   < > 11.3* 10.5*  HCT 34.4*   < > 32.6* 31.0*  MCV 93.2  --  94.5  --   PLT 378  --  356  --    < > = values in this interval not displayed.   Basic Metabolic Panel Recent Labs    13/24/40 0603 02/02/23 0626 02/02/23 1724 02/02/23 1725 02/02/23 1730 02/03/23 0025 02/03/23 0749  NA 123*   < >  --  123*   < > 125* 123*  K 4.0   < >  --  3.7   < > 3.9 4.2  CL 94*  --   --  93*  --  93*  --   CO2 22  --   --  20*  --  21*  --   GLUCOSE 147*  --   --  161*  --  144*  --   BUN 14  --   --  17  --  15  --   CREATININE 0.73  --   --  0.78  --  0.83  --   CALCIUM 8.7*  --   --  7.8*  --  7.9*  --   MG 2.7*  --  2.3  --   --  1.8  --   PHOS 3.7  --   --   --   --   --   --    < > = values in this interval not displayed.   Liver Function Tests Recent Labs    02/02/23 0252  AST 180*  ALT 90*  ALKPHOS 39  BILITOT 0.6  PROT 5.4*  ALBUMIN 3.1*   No results for input(s): "LIPASE", "AMYLASE" in the last 72 hours. Cardiac Enzymes No results for input(s): "CKTOTAL", "CKMB", "CKMBINDEX", "TROPONINI" in the last 72 hours. BNP Invalid input(s): "POCBNP" D-Dimer No results for input(s): "DDIMER" in the last 72 hours. Hemoglobin A1C Recent Labs    02/01/23 0529  HGBA1C 5.6   Fasting Lipid Panel No results for input(s): "CHOL", "HDL", "LDLCALC", "TRIG", "CHOLHDL", "LDLDIRECT" in the last 72 hours. Thyroid Function Tests No results for input(s): "TSH", "T4TOTAL", "T3FREE",  "THYROIDAB" in the last 72 hours.  Invalid input(s): "FREET3"  Telemetry    Sinus with  frequent PVCs  - Personally Reviewed  ECG    Sinus with IRBBB and 1st deg AV block, frequent PVCs, HR 89 - Personally Reviewed  Radiology    DG Chest Port 1 View  Result Date: 02/03/2023 CLINICAL DATA:  Intra-aortic balloon pump. EXAM: PORTABLE CHEST 1 VIEW COMPARISON:  02/02/2023 FINDINGS: The cardio pericardial silhouette is enlarged. Low volume film. Interval increase in streaky opacity in the right mid lung, potentially atelectasis. Left base atelectasis or infiltrate with small effusion is similar to prior. Endotracheal tube tip is approximately 3.4 cm above the carina. The NG tube passes into the stomach although the distal tip position is not included on the film. Right IJ pulmonary artery catheter tip is in the interlobar pulmonary artery. Left IJ central line tip overlies the mid SVC level. The tip of the intra-aortic balloon pump overlies the proximal descending thoracic aorta. Telemetry leads overlie the chest. IMPRESSION: 1. Low volume film with interval increase in streaky opacity in the right mid lung, potentially atelectasis. 2. Persistent left base atelectasis or infiltrate with small effusion. 3. Support apparatus as described. Electronically Signed   By: Kennith Center M.D.   On: 02/03/2023 07:56   CARDIAC CATHETERIZATION  Result Date: 02/02/2023 Images from the original result were not included. Finals set of RHC numbers on milrinone 0.375 mics, norepinephrine at 8 mic/min CO: 3.5 L/min CI: 1.7 L/min/m2 Elder Negus, MD Pager: [phone] Office: [phone]  DG Abd 1 View  Result Date: 02/02/2023 CLINICAL DATA:  Orogastric tube placement. EXAM: ABDOMEN - 1 VIEW COMPARISON:  CT 01/28/2023 FINDINGS: Limited field of view for tube placement evaluation. An enteric tube has been placed with tip projecting in the mid abdomen to the right of midline consistent with location in the  distal stomach. There is evidence of left pleural effusion with basilar atelectasis or infiltration. IMPRESSION: Enteric tube tip projects over the right mid abdomen consistent with location in the distal stomach. Electronically Signed   By: Burman Nieves M.D.   On: 02/02/2023 17:46   DG CHEST PORT 1 VIEW  Result Date: 02/02/2023 CLINICAL DATA:  [id number] Dyspnea [id number] EXAM: PORTABLE CHEST - 1 VIEW COMPARISON:  02/01/2023 FINDINGS: Endotracheal tube has been placed, tip 5.3 cm above carina. Right IJ central line is been placed to the right pulmonary artery. IABP tip projects just below the aortic arch. Low lung volumes with patchy infiltrate or atelectasis at the left lung base as before. Heart size and mediastinal contours are within normal limits. Aortic Atherosclerosis (ICD10-170.0). Probable small left pleural effusion stable. Surgical clips right axilla. IMPRESSION: 1. Support devices as above. 2. Persistent left basilar infiltrate or atelectasis and small left effusion. Electronically Signed   By: Corlis Leak M.D.   On: 02/02/2023 07:50   CARDIAC CATHETERIZATION  Result Date: 02/02/2023 Images from the original result were not included. 1. Ultrasound guided right common femoral artery access 2. Intraaortic balloon pump placement In case of recurrent VT/VT or hemodynamic decompensation, will consider MCS escalation. Elder Negus, MD Pager: [phone] Office: [phone]  CARDIAC CATHETERIZATION  Addendum Date: 02/02/2023     Aggressive GDMT for HFrEF Right and left heart catheterization 02/02/2023: LM: Normal LAD: Patnet mid LAD stent with no restenosis.stent thrombosis Lcx: Mid diffuse 50% disease Ramus: Patent stent. No restenosis RCA: Mid eccentric 40% disease. Rest mild diffuse disease  RA: 21 mmHg PA: 50/29 mmHg, mPAP 38 mmHg PCW: 29 mmHg  LV: 127/12 mmHg Ao: 102/77 mmHg CO: 5.5 L/min CI: 2.7 L/min/m2 Ernestine Conrad  catheter left in place. IV lasix 20X2 administered in cath lab. Continue IV  amiodarone. Needs aggressive HFrEF management. I had a long conversation with patient and family regarding importance of HFrEF management. Will get heart failure team consult. Elder Negus, MD Pager: [phone] Office: [phone]    Result Date: 02/02/2023 Images from the original result were not included.   Aggressive GDMT for HFrEF Right and left heart catheterization 02/02/2023: LM: Normal LAD: Patnet mid LAD stent with no restenosis.stent thrombosis Lcx: Mid diffuse 50% disease Ramus: Patent stent. No restenosis RCA: Mid eccentric 40% disease. Rest mild diffuse disease  RA: 21 mmHg PA: 50/29 mmHg, mPAP 38 mmHg PCW: 29 mmHg  LV: 127/12 mmHg Ao: 102/77 mmHg CO: 5.5 L/min CI: 2.7 L/min/m2 Swan catheter left in place. IV lasix 20X2 administered in cath lab. Needs aggressive HFrEF management. I had a long conversation with patient and family regarding importance of HFrEF management. Will get heart failure team consult. Elder Negus, MD Pager: [phone] Office: [phone]    DG CHEST PORT 1 VIEW  Result Date: 02/01/2023 CLINICAL DATA:  Heart abnormality. Rapid response/CPR initiated. Right basilar opacities and pleural effusion. EXAM: PORTABLE CHEST 1 VIEW COMPARISON:  01/31/2023 FINDINGS: Cardiac enlargement. Small left pleural effusion with basilar infiltration or atelectasis. Infiltrates are increasing since the prior study, possibly indicating compressive atelectasis or pneumonia. No developing consolidation on the right. Calcification of the aorta. Surgical clips in the right chest. No pneumothorax. IMPRESSION: Small but increasing left pleural effusion and basilar infiltration or atelectasis. Electronically Signed   By: Burman Nieves M.D.   On: 02/01/2023 23:41    Cardiac Studies   CARDIAC CATHETERIZATION   CARDIAC CATHETERIZATION 02/02/2023   Narrative Images from the original result were not included. 1. Ultrasound guided right common femoral artery access 2.  Intraaortic balloon pump placement   In case of recurrent VT/VT or hemodynamic decompensation, will consider MCS escalation.     Elder Negus, MD Pager: [phone] Office: [phone]     CARDIAC CATHETERIZATION 02/02/2023   Narrative Images from the original result were not included.     Aggressive GDMT for HFrEF   Right and left heart catheterization 02/02/2023: LM: Normal LAD: Patnet mid LAD stent with no restenosis.stent thrombosis Lcx: Mid diffuse 50% disease Ramus: Patent stent. No restenosis RCA: Mid eccentric 40% disease. Rest mild diffuse disease   RA: 21 mmHg PA: 50/29 mmHg, mPAP 38 mmHg PCW: 29 mmHg   LV: 127/12 mmHg Ao: 102/77 mmHg CO: 5.5 L/min CI: 2.7 L/min/m2   Swan catheter left in place. IV lasix 20X2 administered in cath lab. Continue IV amiodarone. Needs aggressive HFrEF management. I had a long conversation with patient and family regarding importance of HFrEF management. Will get heart failure team consult.     Elder Negus, MD Pager: [phone] Office: [phone]   Findings Coronary Findings Diagnostic  Dominance: Right   Left Anterior Descending Vessel is normal in caliber. Vessel is angiographically normal. Non-stenotic Mid LAD-1 lesion was previously treated. The lesion is thrombotic. Previously placed stent displays rethrombosis. Non-stenotic Mid LAD-2 lesion was previously treated. The lesion is mildly calcified. Mid LAD-3 lesion is 40% stenosed.   Ramus Intermedius Vessel is large. Non-stenotic Ramus-1 lesion was previously treated. The lesion is concentric. Non-stenotic Ramus-2 lesion with 0% stenosed side branch in Lat Ramus was previously treated. The lesion is type C and concentric. The lesion is calcified.   Lateral Ramus Intermedius Vessel is large in size.   Left Circumflex Prox  Cx to Dist Cx lesion is 50% stenosed.   Right Coronary Artery The vessel exhibits minimal luminal irregularities. Mid  RCA lesion is 40% stenosed.   Intervention   No interventions have been documented.   STRESS TESTS   MYOCARDIAL PERFUSION IMAGING 04/04/2018   ECHOCARDIOGRAM   ECHOCARDIOGRAM COMPLETE 02/01/2023   Narrative ECHOCARDIOGRAM REPORT       Patient Name:   Madison West Simon Date of Exam: 02/01/2023 Medical Rec #:  [id number]           Height:       68.0 in Accession #:    [id number]          Weight:       205.2 lb Date of Birth:  02-28-1956           BSA:          2.066 m Patient Age:    67 years            BP:           108/67 mmHg Patient Gender: F                   HR:           90 bpm. Exam Location:  Inpatient   Procedure: 2D Echo, Cardiac Doppler, Color Doppler and Intracardiac Opacification Agent   Indications:    Cardiac arrest I46.9   History:        Patient has prior history of Echocardiogram examinations, most recent 01/25/2023. Aortic Valve Disease; Risk Factors:Hypertension.   Sonographer:    Darlys Gales Referring Phys: [id number] Beulah Gandy PAYNE   IMPRESSIONS     1. Left ventricular ejection fraction, by estimation, is 20 to 25%. The left ventricle has severely decreased function. The left ventricle demonstrates regional wall motion abnormalities (see scoring diagram/findings for description). The left ventricular internal cavity size was moderately dilated. There is mild concentric left ventricular hypertrophy. Left ventricular diastolic parameters are indeterminate. 2. Right ventricular systolic function is normal. The right ventricular size is normal. Tricuspid regurgitation signal is inadequate for assessing PA pressure. 3. Left atrial size was moderately dilated. 4. The mitral valve is normal in structure. No evidence of mitral valve regurgitation. No evidence of mitral stenosis. 5. The aortic valve is calcified. Aortic valve regurgitation is not visualized. Mild to moderate aortic valve stenosis. Aortic valve area, by VTI measures 1.10 cm. Aortic valve mean  gradient measures 24.5 mmHg. Aortic valve Vmax measures 2.96 m/s. 6. The inferior vena cava is normal in size with greater than 50% respiratory variability, suggesting right atrial pressure of 3 mmHg.   FINDINGS Left Ventricle: Left ventricular ejection fraction, by estimation, is 20 to 25%. The left ventricle has severely decreased function. The left ventricle demonstrates regional wall motion abnormalities. Definity contrast agent was given IV to delineate the left ventricular endocardial borders. The left ventricular internal cavity size was moderately dilated. There is mild concentric left ventricular hypertrophy. Left ventricular diastolic parameters are indeterminate.     LV Wall Scoring: The anterior septum and apex are akinetic. The entire anterior wall, antero-lateral wall, inferior septum, entire inferior wall, and apical lateral segment are hypokinetic.   Right Ventricle: The right ventricular size is normal. No increase in right ventricular wall thickness. Right ventricular systolic function is normal. Tricuspid regurgitation signal is inadequate for assessing PA pressure.   Left Atrium: Left atrial size was moderately dilated.   Right Atrium: Right atrial size was normal in size.  Pericardium: There is no evidence of pericardial effusion.   Mitral Valve: The mitral valve is normal in structure. No evidence of mitral valve regurgitation. No evidence of mitral valve stenosis.   Tricuspid Valve: The tricuspid valve is normal in structure. Tricuspid valve regurgitation is not demonstrated. No evidence of tricuspid stenosis.   Aortic Valve: The aortic valve is calcified. Aortic valve regurgitation is not visualized. Mild to moderate aortic stenosis is present. Aortic valve mean gradient measures 24.5 mmHg. Aortic valve peak gradient measures 35.0 mmHg. Aortic valve area, by VTI measures 1.10 cm.   Pulmonic Valve: The pulmonic valve was normal in structure. Pulmonic valve  regurgitation is not visualized. No evidence of pulmonic stenosis.   Aorta: The aortic root is normal in size and structure.   Venous: The inferior vena cava is normal in size with greater than 50% respiratory variability, suggesting right atrial pressure of 3 mmHg.   IAS/Shunts: No atrial level shunt detected by color flow Doppler.     LEFT VENTRICLE PLAX 2D LVIDd:         5.50 cm   Diastology LVIDs:         5.20 cm   LV e' medial:    9.14 cm/s LV PW:         1.20 cm   LV E/e' medial:  8.9 LV IVS:        1.10 cm   LV e' lateral:   4.13 cm/s LVOT diam:     1.80 cm   LV E/e' lateral: 19.7 LV SV:         70 LV SV Index:   34 LVOT Area:     2.54 cm     RIGHT VENTRICLE             IVC RV S prime:     12.00 cm/s  IVC diam: 2.50 cm TAPSE (M-mode): 3.4 cm   LEFT ATRIUM             Index        RIGHT ATRIUM          Index LA Vol (A2C):   79.7 ml 38.57 ml/m  RA Area:     9.99 cm LA Vol (A4C):   86.3 ml 41.76 ml/m  RA Volume:   17.90 ml 8.66 ml/m LA Biplane Vol: 87.1 ml 42.15 ml/m AORTIC VALVE AV Area (Vmax):    1.07 cm AV Area (Vmean):   0.98 cm AV Area (VTI):     1.10 cm AV Vmax:           296.00 cm/s AV Vmean:          242.500 cm/s AV VTI:            0.638 m AV Peak Grad:      35.0 mmHg AV Mean Grad:      24.5 mmHg LVOT Vmax:         125.00 cm/s LVOT Vmean:        93.100 cm/s LVOT VTI:          0.275 m LVOT/AV VTI ratio: 0.43   MITRAL VALVE MV Area (PHT): 4.33 cm     SHUNTS MV Decel Time: 175 msec     Systemic VTI:  0.28 m MV E velocity: 81.20 cm/s   Systemic Diam: 1.80 cm MV A velocity: 133.00 cm/s MV E/A ratio:  0.61   Kardie Tobb DO Electronically signed by Thomasene Ripple DO Signature Date/Time: 02/01/2023/11:38:32 AM  Final     CT SCANS   CT CORONARY MORPH W/CTA COR W/SCORE 01/28/2023   Addendum 01/28/2023  5:37 PM ADDENDUM REPORT: 01/28/2023 17:01   CLINICAL DATA:  Aortic stenosis   EXAM: Cardiac TAVR CT   TECHNIQUE: The patient was scanned  on a Siemens Force 192 slice scanner. A 120 kV retrospective scan was triggered in the descending thoracic aorta at 111 HU's. Gantry rotation speed was 270 msecs and collimation was .9 mm. No beta blockade or nitro were given. The 3D data set was reconstructed in 5% intervals of the R-R cycle. Systolic and diastolic phases were analyzed on a dedicated work station using MPR, MIP and VRT modes. The patient received 80 cc of contrast.   FINDINGS: Aortic Valve: Tri leaflet aortic valve calcified with restricted motion Calcium Score 2137 Large area of bulky calcification involving the non coronary cusp   Aorta: No aneurysm moderate calcific atherosclerosis   Sinotubular Junction: 23 mm   Ascending Thoracic Aorta: 32 mm   Aortic Arch: 30 mm   Descending Thoracic Aorta: 23 mm   Sinus of Valsalva Measurements:   Non-coronary: 30 mm   height 21 mm   Right - coronary: 29.4 mm  height 19 mm   Left - coronary: 28.1 mm   height 20.5 mm   Coronary Artery Height above Annulus:   Left Main: 13.9 mm above annulus   Right Coronary: 15.3 mm above annulus   Virtual Basal Annulus Measurements:   Maximum/Minimum Diameter: 25.1 mm x 22.3 mm Average diameter 23.7 mm   Perimeter: 75.2 mm   Area: 440 mm2   Coronary Arteries: Sufficient height above annulus for deployment   Optimum Fluoroscopic Angle for Delivery: LAO 1 Caudal 2 degrees   Membranous septal length 5.3 mm   IMPRESSION: 1. Calcified tri leaflet AV with score 2137 Bulky calcium noted on non coronary cusp   2. Annular area 440 mm2 suitable on lower end for 26 mm Sapien Ultra valve. Left coronary sinus small to consider 29 mm Medtronic Evolut   3.  Optimum angiographic angle for deployment LAO 1 Caudal 2 degrees   4.  Membranous septal length 5.3 mm   5.  Coronary arteries sufficient height above annulus for deployment   Charlton Haws     Electronically Signed By: Charlton Haws M.D. On: 01/28/2023 17:01    Narrative EXAM: OVER-READ INTERPRETATION  CT CHEST   The following report is a limited chest CT over-read performed by radiologist Dr. Allegra Lai of [facility] on 01/28/2023. This over-read does not include interpretation of cardiac or coronary anatomy or pathology. The cardiac TAVR interpretation by the cardiologist is attached.   COMPARISON:  None Available.   FINDINGS: Extracardiac findings will be described separately under dictation for contemporaneously obtained CTA chest, abdomen and pelvis.   IMPRESSION: Please see separate dictation for contemporaneously obtained CTA chest, abdomen and pelvis dated 01/28/2023 for full description of relevant extracardiac findings.   Electronically Signed: By: Allegra Lai M.D. On: 01/28/2023 16:24  Patient Profile     Madison West is a 67 y.o. female with a hx of CAD s/p PCI to the RCA 02/2020, PSVT terminated with adenosine, breast cancer s/p chemo/XRT in 2009, HTN, HLD, s/p R CEA 2020, obesity, former smoker, extreme anxiety with multiple medication intolerances/phobias, and aortic stenosis who is being seen today for the evaluation of severe LFLG AS in the setting of recurrent VT arrest and cardiogenic shock at the request of Dr.  Bensimhon.   Assessment & Plan    Severe LFLG AS: plan is for tentative TAVR tomorrow afternoon with Drs.  and [employer]. Plan 26 mm Edwards S3UR via the left TF approach. Inpatient surgery orders placed.   Fevers: developed fevers overnight with a Tmax 100.9. White count 13.2. Removing sheaths from groins. On broad spectrum Abx. Procalcitonin and blood cultures are pending. Will follow closely.   Acute systolic CHF with cardiogenic shock: IABP at 1:1. Continues on 0.25 milrinone + 4 NE. CI 2.2. Has good UOP. Plan was for ECMO but pt stabilized and held off on this. Appreciate advanced CHF team.  Refractory VT: s/p multiple shocks. Still with frequent ventricular ectopy but  no recent VT on lidocaine and amiodarone gtts  CAD: hx PCI to RCA in 2021. -Presented with unstable angina. S/p PCI/stent to LAD on 08/07. Stent thrombosis 08/09 treated with thrombectomy -HS troponin > 24K on 08/12 -LHC 08/12 with patent LAD stent -Continue DAPT with aspirin and Brilinta. Declined statin therapy in the past.   Hyponatremia: Na 125. Per heart failure, will start on Tolvaptan is drops below this   Carotid artery stenosis: s/p R CEA in 2020  Respiratory failure: sedated on vent. Had to escalate anxiolytics and sedation due to anxiety on vent. Appreciate PCCM   Signed, Cline Crock, PA-C  02/03/2023, 10:59 AM  Pager [phone]   ATTENDING ATTESTATION:  After conducting a review of all available clinical information with the care team, interviewing the patient, and performing a physical exam, I agree with the findings and plan described in this note.   GEN: Intubated, responsive HEENT: Intubated, right IJ Swan in place Cardiac: RRR, 3/6 SEM Respiratory: anterior lung fields clear GI: Soft, nontender, non-distended  MS: No edema; No deformity. Neuro:  Nonfocal  Vasc:  R IABP, L CFA arterial line (now removed), LCFV line (now removed)  Patient remains intubated though responsive on the vent.  Her filling pressures are much improved with a CVP of 9 and PA diastolic pressure of around 18 to 21 mmHg.  The corresponding wedge pressure is 20.  The patient did develop low-grade fevers overnight and her cardiac output has increased significantly to 7.2 L/min.  Co-oximetry excision saturation this morning was 69.8.  The patient has been started on broad-spectrum antibiotics.  Her procalcitonin is negative.  Since my exam her peripheral lines aside from her balloon pump have been removed.  The patient has had no recurrent malignant ventricular tachyarrhythmias.  The patient is norepinephrine requirement did increase overnight however that is now being weaned.  Milrinone dose  remained stable.  For now our provisional plan is to pursue a left transfemoral aortic valve replacement tomorrow afternoon as long as the patient does not develop sepsis.  Will discuss with advanced heart failure team later today and tomorrow morning.   Alverda Skeans, MD Pager [phone]  CRITICAL CARE Performed by: Alverda Skeans   Total critical care time: 30 minutes. Critical care time was exclusive of separately billable procedures and treating other patients. Critical care was necessary to treat or prevent imminent or life-threatening deterioration. Critical care was time spent personally by me on the following activities: development of treatment plan with patient and/or surrogate as well as nursing, discussions with consultants, evaluation of patient's response to treatment, examination of patient, obtaining history from patient or surrogate, ordering and performing treatments and interventions, ordering and review of laboratory studies, ordering and review of radiographic studies, pulse oximetry and re-evaluation of patient's condition.

## 2023-02-03 NOTE — Progress Notes (Signed)
PT Cancellation Note  Patient Details Name: Madison West MRN: [id number] DOB: Feb 23, 1956   Cancelled Treatment:    Reason Eval/Treat Not Completed: Medical issues which prohibited therapy; noted events and plans for possible TAVR tomorrow.  Will follow up.    [name] 02/03/2023, 10:07 AM [name], PT Acute Rehabilitation Services Office:[phone] 02/03/2023

## 2023-02-03 NOTE — Progress Notes (Signed)
Nutrition Follow-up  DOCUMENTATION CODES:   Not applicable  INTERVENTION:   While IABP in place, reversed trendelenburg of >10 degrees at all times while TF infusing. HOB >30 degrees once able  Tube Feeding via OG:  Vital AF 1.2 at 20 ml/hr Goal TF Vital AF 1.2 at 55 ml/hr with Pro-Source TF20 60 mL BID TF at goal provides 119 g of protein, 1664 kcals and 1069 mL of free water  Pro-Source TF20 60 mL QID while on trickle TF , each packet provides 20 grams of protein and 80 kcals.    NUTRITION DIAGNOSIS:   Inadequate oral intake related to acute illness as evidenced by NPO status.  GOAL:   Patient will meet greater than or equal to 90% of their needs   MONITOR:   TF tolerance, Vent status, Labs, Weight trends  REASON FOR ASSESSMENT:   Ventilator    ASSESSMENT:   67 yo female admitted progressively worsening SOB and fatigue with weight gain and admitted with acute systolic failure. Pt with CODE STEMI post Vtach arrest, developed refractory VT requiring IABP, intubation. PMH includes HTN, HLD, CAD s/p stenting, moderate aprtic stenosis  8/04 Admitted 8/11 V-tach arrest, CPR initiated, Transferred to ICU, Code STEMI 8/13 Developed refractory VT, shocked multiple times, IABP. Considered for VA ECMO but decision made not to cannulate  Pt remains on vent support, IABP 1:1. Febrile Requiring increased sedation this AM.  Levophed at 15, off phenylephrine  Remains on amiodarone, lidocaine, milrinone gtt in addition to fentanyl versed and precedex  OG tube in place in distal stomach per abd xray report Abdomen soft, last BM 8/12  Limited documentation of po intake, only 1 meal documented since admission, ate 100% of breakfast on 8/11. Unable to obtain diet and weight history from patient at this time.   Weight up post-op as expected. Current wt 103.3 kg, wt 96 kg yesterday, Noted weight around 93-94 kg most of admission  UOP 2.64 L in 24 hours but net +1.3 L over same time  frame. UOP 1.6 L thus far today. Net negative 9 L since admission (if I/O accurate)  Labs:  sodium 123 (L), potassium 4.2 (wdl), Creatinine wdl, lactic acid 0.4 Meds: laisx, ss novolog, KCl   NUTRITION - FOCUSED PHYSICAL EXAM:  Flowsheet Row Most Recent Value  Orbital Region No depletion  Upper Arm Region No depletion  Thoracic and Lumbar Region No depletion  Buccal Region Unable to assess  Clavicle Bone Region No depletion  Clavicle and Acromion Bone Region No depletion  Scapular Bone Region Unable to assess  Dorsal Hand No depletion  Patellar Region No depletion  Anterior Thigh Region No depletion  Posterior Calf Region No depletion  Edema (RD Assessment) Mild       Diet Order:   Diet Order     None       EDUCATION NEEDS:   Not appropriate for education at this time  Skin:  Skin Assessment: Reviewed RN Assessment  Last BM:  8/12  Height:   Ht Readings from Last 1 Encounters:  01/24/23 5\' 8"  (1.727 m)    Weight:   Wt Readings from Last 1 Encounters:  02/03/23 103.3 kg     BMI:  Body mass index is 34.63 kg/m.  Estimated Nutritional Needs:   Kcal:  1550 via IC on 02/03/23  Protein:  100-120 g  Fluid:  >/= 1.5 L   Romelle Starcher MS, RDN, LDN, CNSC Registered Dietitian 3 Clinical Nutrition RD Pager and On-Call Pager  Number Located in [place]

## 2023-02-04 ENCOUNTER — Inpatient Hospital Stay (HOSPITAL_COMMUNITY): Payer: Medicare Other

## 2023-02-04 ENCOUNTER — Inpatient Hospital Stay (HOSPITAL_COMMUNITY): Payer: Medicare Other | Admitting: Anesthesiology

## 2023-02-04 ENCOUNTER — Encounter (HOSPITAL_COMMUNITY)
Admission: EM | Disposition: A | Discharge status: Rehabilitation Facility | Payer: Medicare Other | Source: Home / Self Care | Attending: Pulmonary Disease

## 2023-02-04 ENCOUNTER — Encounter (HOSPITAL_COMMUNITY): Payer: Self-pay | Admitting: Internal Medicine

## 2023-02-04 DIAGNOSIS — Z87891 Personal history of nicotine dependence: Secondary | ICD-10-CM

## 2023-02-04 DIAGNOSIS — Z006 Encounter for examination for normal comparison and control in clinical research program: Secondary | ICD-10-CM

## 2023-02-04 DIAGNOSIS — R570 Cardiogenic shock: Secondary | ICD-10-CM | POA: Diagnosis not present

## 2023-02-04 DIAGNOSIS — I350 Nonrheumatic aortic (valve) stenosis: Secondary | ICD-10-CM

## 2023-02-04 DIAGNOSIS — I110 Hypertensive heart disease with heart failure: Secondary | ICD-10-CM | POA: Diagnosis not present

## 2023-02-04 DIAGNOSIS — I5021 Acute systolic (congestive) heart failure: Secondary | ICD-10-CM

## 2023-02-04 HISTORY — PX: TEE WITHOUT CARDIOVERSION: SHX5443

## 2023-02-04 HISTORY — PX: TRANSCATHETER AORTIC VALVE REPLACEMENT, TRANSFEMORAL: SHX6400

## 2023-02-04 LAB — SURGICAL PCR SCREEN
MRSA, PCR: NEGATIVE
Staphylococcus aureus: NEGATIVE

## 2023-02-04 LAB — ECHO TEE
AR max vel: 2.18 cm2
AV Area VTI: 2.43 cm2
AV Area mean vel: 2.57 cm2
AV Mean grad: 6 mmHg
AV Peak grad: 12.8 mmHg
Ao pk vel: 1.79 m/s
Height: 68 in
P 1/2 time: 515 ms
Weight: 3689.62 [oz_av]

## 2023-02-04 LAB — BASIC METABOLIC PANEL
Anion gap: 8 (ref 5–15)
Anion gap: 8 (ref 5–15)
BUN: 14 mg/dL (ref 8–23)
BUN: 17 mg/dL (ref 8–23)
CO2: 20 mmol/L — ABNORMAL LOW (ref 22–32)
CO2: 19 mmol/L — ABNORMAL LOW (ref 22–32)
Calcium: 7.5 mg/dL — ABNORMAL LOW (ref 8.9–10.3)
Calcium: 7.2 mg/dL — ABNORMAL LOW (ref 8.9–10.3)
Chloride: 97 mmol/L — ABNORMAL LOW (ref 98–111)
Chloride: 98 mmol/L (ref 98–111)
Creatinine, Ser: 0.74 mg/dL (ref 0.44–1.00)
Creatinine, Ser: 0.65 mg/dL (ref 0.44–1.00)
GFR, Estimated: >60 mL/min (ref >60)
GFR, Estimated: >60 mL/min (ref >60)
Glucose, Bld: 136 mg/dL — ABNORMAL HIGH (ref 70–99)
Glucose, Bld: 141 mg/dL — ABNORMAL HIGH (ref 70–99)
Potassium: 3.5 mmol/L (ref 3.5–5.1)
Potassium: 4 mmol/L (ref 3.5–5.1)
Sodium: 125 mmol/L — ABNORMAL LOW (ref 135–145)
Sodium: 125 mmol/L — ABNORMAL LOW (ref 135–145)

## 2023-02-04 LAB — GLUCOSE, CAPILLARY
Glucose-Capillary: 126 mg/dL — ABNORMAL HIGH (ref 70–99)
Glucose-Capillary: 127 mg/dL — ABNORMAL HIGH (ref 70–99)
Glucose-Capillary: 133 mg/dL — ABNORMAL HIGH (ref 70–99)
Glucose-Capillary: 133 mg/dL — ABNORMAL HIGH (ref 70–99)
Glucose-Capillary: 145 mg/dL — ABNORMAL HIGH (ref 70–99)
Glucose-Capillary: 146 mg/dL — ABNORMAL HIGH (ref 70–99)

## 2023-02-04 LAB — PHOSPHORUS
Phosphorus: 2.3 mg/dL — ABNORMAL LOW (ref 2.5–4.6)
Phosphorus: 2.9 mg/dL (ref 2.5–4.6)

## 2023-02-04 LAB — TRIGLYCERIDES: Triglycerides: 91 mg/dL (ref <150)

## 2023-02-04 LAB — CBC
HCT: 28.6 % — ABNORMAL LOW (ref 36.0–46.0)
Hemoglobin: 9.9 g/dL — ABNORMAL LOW (ref 12.0–15.0)
MCH: 32 pg (ref 26.0–34.0)
MCHC: 34.6 g/dL (ref 30.0–36.0)
MCV: 92.6 fL (ref 80.0–100.0)
Platelets: 270 10*3/uL (ref 150–400)
RBC: 3.09 MIL/uL — ABNORMAL LOW (ref 3.87–5.11)
RDW: 13.6 % (ref 11.5–15.5)
WBC: 8.8 10*3/uL (ref 4.0–10.5)
nRBC: 0 % (ref 0.0–0.2)

## 2023-02-04 LAB — COOXEMETRY PANEL
Carboxyhemoglobin: 1.9 % — ABNORMAL HIGH (ref 0.5–1.5)
Methemoglobin: <0.7 % (ref 0.0–1.5)
O2 Saturation: 73 %
Total hemoglobin: 10.3 g/dL — ABNORMAL LOW (ref 12.0–16.0)

## 2023-02-04 LAB — MAGNESIUM
Magnesium: 2 mg/dL (ref 1.7–2.4)
Magnesium: 2.3 mg/dL (ref 1.7–2.4)

## 2023-02-04 LAB — HEPARIN LEVEL (UNFRACTIONATED): Heparin Unfractionated: <0.1 [IU]/mL — ABNORMAL LOW (ref 0.30–0.70)

## 2023-02-04 SURGERY — IMPLANTATION, AORTIC VALVE, TRANSCATHETER, FEMORAL APPROACH
Anesthesia: General

## 2023-02-04 MED ORDER — SODIUM CHLORIDE 0.9 % IV SOLN: 250.0000 mL | INTRAVENOUS | Status: DC | PRN

## 2023-02-04 MED ORDER — MAGNESIUM SULFATE 2 GM/50ML IV SOLN
2.0000 g | Freq: Once | INTRAVENOUS | Status: AC
  Administered 2023-02-04: 2 g via INTRAVENOUS
  Filled 2023-02-04: qty 50

## 2023-02-04 MED ORDER — MIDAZOLAM BOLUS VIA INFUSION
5.0000 mg | Freq: Once | INTRAVENOUS | Status: AC
  Administered 2023-02-04: 5 mg via INTRAVENOUS
  Filled 2023-02-04: qty 5

## 2023-02-04 MED ORDER — HEPARIN SODIUM (PORCINE) 1000 UNIT/ML IJ SOLN
INTRAMUSCULAR | Status: DC | PRN
  Administered 2023-02-04: 16000 [IU] via INTRAVENOUS

## 2023-02-04 MED ORDER — POTASSIUM CHLORIDE 10 MEQ/50ML IV SOLN
10.0000 meq | INTRAVENOUS | Status: AC
  Administered 2023-02-04 (×4): 10 meq via INTRAVENOUS
  Filled 2023-02-04 (×4): qty 50

## 2023-02-04 MED ORDER — NITROGLYCERIN IN D5W 200-5 MCG/ML-% IV SOLN
INTRAVENOUS | Status: DC | PRN
  Administered 2023-02-04: .02 mg via INTRAVENOUS

## 2023-02-04 MED ORDER — SODIUM CHLORIDE 0.9 % IV SOLN: INTRAVENOUS | Status: AC

## 2023-02-04 MED ORDER — NITROGLYCERIN IN D5W 200-5 MCG/ML-% IV SOLN: 0.0000 ug/min | INTRAVENOUS | Status: DC

## 2023-02-04 MED ORDER — EPINEPHRINE 1 MG/10ML IJ SOSY
PREFILLED_SYRINGE | INTRAMUSCULAR | Status: AC
  Filled 2023-02-04: qty 10

## 2023-02-04 MED ORDER — MIDAZOLAM BOLUS VIA INFUSION
2.0000 mg | INTRAVENOUS | Status: DC | PRN
  Administered 2023-02-05 – 2023-02-07 (×3): 2 mg via INTRAVENOUS

## 2023-02-04 MED ORDER — CHLORHEXIDINE GLUCONATE CLOTH 2 % EX PADS
6.0000 | MEDICATED_PAD | Freq: Once | CUTANEOUS | Status: AC
  Administered 2023-02-04: 6 via TOPICAL

## 2023-02-04 MED ORDER — CHLORHEXIDINE GLUCONATE 0.12 % MT SOLN
15.0000 mL | Freq: Once | OROMUCOSAL | Status: DC
  Filled 2023-02-04: qty 15

## 2023-02-04 MED ORDER — METOPROLOL TARTRATE 12.5 MG HALF TABLET: 12.5000 mg | ORAL_TABLET | Freq: Once | ORAL | Status: DC

## 2023-02-04 MED ORDER — FUROSEMIDE 10 MG/ML IJ SOLN
60.0000 mg | Freq: Once | INTRAMUSCULAR | Status: AC
  Administered 2023-02-04: 60 mg via INTRAVENOUS
  Filled 2023-02-04: qty 6

## 2023-02-04 MED ORDER — HEPARIN SODIUM (PORCINE) 1000 UNIT/ML IJ SOLN
INTRAMUSCULAR | Status: AC
  Filled 2023-02-04: qty 20

## 2023-02-04 MED ORDER — HEPARIN SODIUM (PORCINE) 1000 UNIT/ML IJ SOLN
INTRAMUSCULAR | Status: DC | PRN
  Administered 2023-02-04: 5000 [IU] via INTRA_ARTERIAL

## 2023-02-04 MED ORDER — VERAPAMIL HCL 2.5 MG/ML IV SOLN
INTRAVENOUS | Status: DC | PRN
  Administered 2023-02-04: 10 mL via INTRA_ARTERIAL

## 2023-02-04 MED ORDER — PROTAMINE SULFATE 10 MG/ML IV SOLN
INTRAVENOUS | Status: DC | PRN
  Administered 2023-02-04: 160 mg via INTRAVENOUS

## 2023-02-04 MED ORDER — MUPIROCIN 2 % EX OINT
1.0000 | TOPICAL_OINTMENT | Freq: Two times a day (BID) | CUTANEOUS | Status: AC
  Administered 2023-02-04 – 2023-02-08 (×10): 1 via NASAL
  Filled 2023-02-04: qty 22

## 2023-02-04 MED ORDER — HEPARIN (PORCINE) IN NACL 1000-0.9 UT/500ML-% IV SOLN
INTRAVENOUS | Status: DC | PRN
  Administered 2023-02-04 (×3): 500 mL

## 2023-02-04 MED ORDER — SODIUM CHLORIDE 0.9% FLUSH: 3.0000 mL | INTRAVENOUS | Status: DC | PRN

## 2023-02-04 MED ORDER — POTASSIUM PHOSPHATES 15 MMOLE/5ML IV SOLN
30.0000 mmol | Freq: Once | INTRAVENOUS | Status: AC
  Administered 2023-02-04: 30 mmol via INTRAVENOUS
  Filled 2023-02-04: qty 10

## 2023-02-04 MED ORDER — NITROGLYCERIN IN D5W 200-5 MCG/ML-% IV SOLN
INTRAVENOUS | Status: AC
  Filled 2023-02-04: qty 250

## 2023-02-04 MED ORDER — SODIUM CHLORIDE 0.9% FLUSH
3.0000 mL | Freq: Two times a day (BID) | INTRAVENOUS | Status: DC
  Administered 2023-02-05 – 2023-02-13 (×15): 3 mL via INTRAVENOUS

## 2023-02-04 MED ORDER — CEFAZOLIN SODIUM-DEXTROSE 2-4 GM/100ML-% IV SOLN
2.0000 g | Freq: Three times a day (TID) | INTRAVENOUS | Status: AC
  Administered 2023-02-04 – 2023-02-05 (×2): 2 g via INTRAVENOUS
  Filled 2023-02-04: qty 100

## 2023-02-04 MED ORDER — LIDOCAINE HCL (PF) 1 % IJ SOLN
INTRAMUSCULAR | Status: DC | PRN
  Administered 2023-02-04: 5 mL
  Administered 2023-02-04: 15 mL
  Administered 2023-02-04: 5 mL

## 2023-02-04 MED ORDER — POTASSIUM CHLORIDE 20 MEQ PO PACK
40.0000 meq | PACK | Freq: Once | ORAL | Status: AC
  Administered 2023-02-04: 40 meq
  Filled 2023-02-04: qty 2

## 2023-02-04 MED ORDER — IOHEXOL 350 MG/ML SOLN
INTRAVENOUS | Status: DC | PRN
  Administered 2023-02-04: 85 mL

## 2023-02-04 SURGICAL SUPPLY — 29 items
BAG SNAP BAND KOVER 36X36 (MISCELLANEOUS) ×2 IMPLANT
CABLE ADAPT PACING TEMP 12FT (ADAPTER) IMPLANT
CATH 26 ULTRA DELIVERY (CATHETERS) IMPLANT
CATH DIAG 6FR PIGTAIL ANGLED (CATHETERS) IMPLANT
CATH INFINITI 5FR ANG PIGTAIL (CATHETERS) IMPLANT
CATH INFINITI 6F AL1 (CATHETERS) IMPLANT
CATH INFINITI 6F ANG MULTIPACK (CATHETERS) IMPLANT
CLOSURE PERCLOSE PROSTYLE (VASCULAR PRODUCTS) IMPLANT
CRIMPER (MISCELLANEOUS) IMPLANT
DEVICE INFLATION ATRION QL2530 (MISCELLANEOUS) IMPLANT
DEVICE RAD COMP TR BAND LRG (VASCULAR PRODUCTS) IMPLANT
GLIDESHEATH SLEND SS 6F .021 (SHEATH) IMPLANT
KIT SAPIAN 3 ULTRA RESILIA 26 (Valve) IMPLANT
PACK CARDIAC CATHETERIZATION (CUSTOM PROCEDURE TRAY) ×1 IMPLANT
SET ATX-X65L (MISCELLANEOUS) IMPLANT
SHEATH INTRODUCER SET 20-26 (SHEATH) IMPLANT
SHEATH PINNACLE 6F 10CM (SHEATH) IMPLANT
SHEATH PINNACLE 8F 10CM (SHEATH) IMPLANT
SHEATH PROBE COVER 6X72 (BAG) IMPLANT
STOPCOCK MORSE 400PSI 3WAY (MISCELLANEOUS) ×2 IMPLANT
TUBING CIL FLEX 10 FLL-RA (TUBING) IMPLANT
WIRE AMPLATZ SS-J .035X180CM (WIRE) IMPLANT
WIRE EMERALD 3MM-J .035X150CM (WIRE) IMPLANT
WIRE EMERALD 3MM-J .035X260CM (WIRE) IMPLANT
WIRE EMERALD ST .035X260CM (WIRE) IMPLANT
WIRE G STIF.035X180 STR (WIRE) IMPLANT
WIRE MICRO SET SILHO 5FR 7 (SHEATH) IMPLANT
WIRE PACING TEMP ST TIP 5 (CATHETERS) IMPLANT
WIRE SAFARI SM CURVE 275 (WIRE) IMPLANT

## 2023-02-04 NOTE — Interval H&P Note (Signed)
History and Physical Interval Note:  02/04/2023 3:20 PM  Madison West  has presented today for surgery, with the diagnosis of Severe Aortic Stenosis.  The various methods of treatment have been discussed with the patient and family. After consideration of risks, benefits and other options for treatment, the patient has consented to  Procedure(s): Transcatheter Aortic Valve Replacement, Transfemoral (N/A) TRANSESOPHAGEAL ECHOCARDIOGRAM (N/A) as a surgical intervention.  The patient's history has been reviewed, patient examined, no change in status, stable for surgery.  I have reviewed the patient's chart and labs.  Questions were answered to the patient's satisfaction.     Eugenio Hoes

## 2023-02-04 NOTE — Progress Notes (Signed)
ANTICOAGULATION CONSULT NOTE  Pharmacy Consult for Heparin Indication: IABP  Allergies  Allergen Reactions   Prednisone Palpitations   Fentanyl Swelling and Other (See Comments)    Makes pt "hyper" feels she is "climbing the walls."  Last time she had a colonoscopy with Fentanyl and Versed she was awake the whole time.   Versed [Midazolam] Swelling and Anxiety    Makes pt "hyper" feels she is "climbing the walls."  Last time she had a colonoscopy with Fentanyl and Versed she was awake the whole time.   Vicodin [Hydrocodone-Acetaminophen] Anxiety    Extreme anxiety.  OK to take oxycodone.    Patient Measurements: Height: 5\' 8"  (172.7 cm) Weight: 103.3 kg (227 lb 11.8 oz) IBW/kg (Calculated) : 63.9 Heparin Dosing Weight: 85 kg  Vital Signs: Temp: 99.9 F (37.7 C) (08/15 0400) Temp Source: Core (08/15 0000) BP: 98/70 (08/15 0400) Pulse Rate: 217 (08/15 0312)  Labs: Recent Labs    02/02/23 1725 02/02/23 1730 02/03/23 0025 02/03/23 0738 02/03/23 0749 02/03/23 1603 02/04/23 0207 02/04/23 0400  HGB 11.4*   < > 11.3*  --  10.5*  --  9.9*  --   HCT 34.4*   < > 32.6*  --  31.0*  --  28.6*  --   PLT 378  --  356  --   --   --  270  --   HEPARINUNFRC  --   --  0.15* 0.22*  --   --   --  <0.10*  CREATININE 0.78  --  0.83  --   --  0.88 0.74  --    < > = values in this interval not displayed.    Estimated Creatinine Clearance: 83.5 mL/min (by C-G formula based on SCr of 0.74 mg/dL).   Medical History: Past Medical History:  Diagnosis Date   Anxiety    Arthritis    low back and hip pain intermittent   Breast cancer (HCC) 06/02/07   r breast -surgery ,radiaology. chemotherapy   Carpal tunnel syndrome    right hand   Colon polyps    Complication of anesthesia    Fentanyl, Versed-makes extra hyper, bradycardia x 1 in PACU, [facility] (08/15/11 cardiology felt neostigmine may have resulted in AV nodal block)    Coronary artery disease    Depression    denies   Dysplasia of  vulva    Hypertension    Palpitations    PSVT, s/p adenosine 08/04/16   S/P breast lumpectomy 07/04/07   R breast   S/P radiation therapy 2009    Medications:  Scheduled:   sodium chloride   Intravenous Once   ALPRAZolam  1 mg Oral Q6H   aspirin  81 mg Oral Daily   bisacodyl  5 mg Oral Once   busPIRone  5 mg Oral BID   chlorhexidine  1 Application Topical Once   chlorhexidine  15 mL Mouth/Throat Once   Chlorhexidine Gluconate Cloth  6 each Topical Daily   famotidine  20 mg Oral BID   feeding supplement (PROSource TF20)  60 mL Per Tube QID   fentaNYL (SUBLIMAZE) injection  50 mcg Intravenous Once   furosemide  60 mg Intravenous Once   insulin aspart  2-6 Units Subcutaneous Q4H   lidocaine  1 patch Transdermal Q24H   magnesium sulfate  40 mEq Other To OR   mouth rinse  15 mL Mouth Rinse Q2H   potassium chloride  40 mEq Oral Daily   potassium chloride  80  mEq Other To OR   rosuvastatin  20 mg Oral Daily   sodium chloride flush  3 mL Intravenous Q12H   sodium chloride flush  3 mL Intravenous Q12H   sodium chloride flush  3 mL Intravenous Q12H   sodium chloride flush  3 mL Intravenous Q12H   ticagrelor  90 mg Oral BID    Assessment: 67 y.o. female with CHF s/p PCI, now with recurrent VT/VF s/p IABP placement for heparin  Heparin drip 1100 u/h with heparin level 0.22 at goal - no bleeding noted CBC stable   8/15 AM update:  Heparin level sub-therapeutic   Goal of Therapy:  Heparin level 0.2-0.5 units/mL Monitor platelets by anticoagulation protocol: Yes   Plan:  Inc heparin to 1200 units/hr Heparin level in 6-8 hours Likely TAVR today  Abran Duke, PharmD, BCPS Clinical Pharmacist Phone: [phone]

## 2023-02-04 NOTE — Progress Notes (Signed)
I have reviewed the chart, I discussed with Dr. Alverda Skeans, reviewed the notes from Dr. Gala Romney, after review, attempting TAVR would feel appropriate as patient is stabilized from VT VF and does have if not critical at least moderate to severe aortic stenosis in a patient with severely reduced LVEF.  Hopefully this will help hemodynamics and we will be able to wean her off of intra-aortic balloon pump and pressors.  Extensive discussions were held within the teams and decisions made to proceed with TAVR today.   Yates Decamp, MD, [facility] 02/04/2023, 1:10 PM Office: [phone] Fax: [phone] Pager: [phone]

## 2023-02-04 NOTE — TOC Initial Note (Signed)
Transition of Care [facility]) - Initial/Assessment Note    Patient Details  Name: Madison West MRN: [id number] Date of Birth: 07-Nov-1955  Transition of Care [facility]) CM/SW Contact:    Elliot Cousin, RN Phone Number: [phone] 02/04/2023, 2:34 PM  Clinical Narrative:  HF TOC CM spoke to pt's dtr, Baird Lyons. States she and her sister will be staying with pt after she is discharged. Waiting PT/OT recommendations and evaluation. Will need IP rehab vs HH. Updated dtr on plan and will wait until she is medically stable for dc disposition.                  Expected Discharge Plan: IP Rehab Facility Barriers to Discharge: Continued Medical Work up   Patient Goals and CMS Choice Patient states their goals for this hospitalization and ongoing recovery are:: wants patient to recover   Choice offered to / list presented to : NA      Expected Discharge Plan and Services In-house Referral: NA Discharge Planning Services: CM Consult Post Acute Care Choice: NA Living arrangements for the past 2 months: Single Family Home                                      Prior Living Arrangements/Services Living arrangements for the past 2 months: Single Family Home Lives with:: Self Patient language and need for interpreter reviewed:: Yes Do you feel safe going back to the place where you live?: Yes      Need for Family Participation in Patient Care: Yes (Comment) Care giver support system in place?: Yes (comment)   Criminal Activity/Legal Involvement Pertinent to Current Situation/Hospitalization: No - Comment as needed  Activities of Daily Living Home Assistive Devices/Equipment: None ADL Screening (condition at time of admission) Patient's cognitive ability adequate to safely complete daily activities?: Yes Is the patient deaf or have difficulty hearing?: No Does the patient have difficulty seeing, even when wearing glasses/contacts?: No Does the patient have difficulty  concentrating, remembering, or making decisions?: No Patient able to express need for assistance with ADLs?: Yes Does the patient have difficulty dressing or bathing?: No Independently performs ADLs?: Yes (appropriate for developmental age) Does the patient have difficulty walking or climbing stairs?: No Weakness of Legs: None Weakness of Arms/Hands: None  Permission Sought/Granted Permission sought to share information with : Family Supports, Case Manager Permission granted to share information with : Yes, Verbal Permission Granted  Share Information with NAME: Cherylee Kreke     Permission granted to share info w Relationship: daughter  Permission granted to share info w Contact Information: [phone]  Emotional Assessment Appearance:: Appears stated age Attitude/Demeanor/Rapport: Intubated (Following Commands or Not Following Commands) Affect (typically observed): Appropriate Orientation: : Oriented to Self, Oriented to Place, Oriented to  Time, Oriented to Situation Alcohol / Substance Use: Not Applicable Psych Involvement: No (comment)  Admission diagnosis:  SOB (shortness of breath) [R06.02] Acute CHF (congestive heart failure) (HCC) [I50.9] CHF (congestive heart failure) (HCC) [I50.9] Patient Active Problem List   Diagnosis Date Noted   Torsades de pointes (HCC) 02/02/2023   Ventricular fibrillation (HCC) 02/02/2023   Cardiogenic shock (HCC) 02/02/2023   ST elevation myocardial infarction involving left anterior descending (LAD) coronary artery (HCC) 01/31/2023   Coronary stent thrombosis 01/31/2023   Atherosclerotic heart disease 01/27/2023   Acute HFrEF (heart failure with reduced ejection fraction) (HCC) 01/27/2023   Nonrheumatic aortic valve stenosis 01/27/2023  Former smoker 01/27/2023   Hypertensive heart disease with acute systolic congestive heart failure (HCC) 01/27/2023   Bilateral carotid artery stenosis 01/27/2023   HFrEF (heart failure with reduced  ejection fraction) (HCC) 01/27/2023   Acute CHF (congestive heart failure) (HCC) 01/24/2023   CHF (congestive heart failure) (HCC) 01/24/2023   Infected cat bite 02/14/2021   Hyponatremia 02/14/2021   Hypokalemia 02/14/2021   Cellulitis 02/14/2021   Coronary artery disease involving native coronary artery of native heart with unstable angina pectoris (HCC)    Pure hypercholesterolemia    Unstable angina (HCC) 02/26/2020   Asymptomatic stenosis of right carotid artery without infarction 06/09/2019   CRPS (complex regional pain syndrome), upper limb 10/19/2012   History of DVT (deep vein thrombosis) 10/05/2011   Hypertension 10/04/2011   Anxiety 10/04/2011   Depression 10/04/2011   Personal history of vulvar dysplasia 08/15/2011   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 [place] [address] Phone: [phone] Fax: [phone]  [id number] [place], [place] - [address] [place] [address] Phone: [phone] Fax: [phone]  [id number] - [place], [place] - [address] [place] [address] Phone: [phone] Fax: [phone]     Social Determinants of Health (SDOH) Social History: SDOH Screenings   Food Insecurity: No Food Insecurity (01/24/2023)  Housing: Low Risk  (01/24/2023)  Transportation Needs: No Transportation Needs (01/24/2023)  Utilities: Not At Risk (01/24/2023)  Depression (PHQ2-9): Low Risk  (01/19/2020)  Tobacco Use: Medium Risk  (02/02/2023)   SDOH Interventions:     Readmission Risk Interventions     No data to display

## 2023-02-04 NOTE — Anesthesia Preprocedure Evaluation (Addendum)
Anesthesia Evaluation  Patient identified by MRN, date of birth, ID band Patient unresponsive    Reviewed: Allergy & Precautions, NPO status , Patient's Chart, lab work & pertinent test results, reviewed documented beta blocker date and time , Unable to perform ROS - Chart review only  Airway Mallampati: Unable to assess      Comment: intubated Dental   Pulmonary former smoker      + intubated    Cardiovascular hypertension, + angina  + CAD, + Past MI (recent cardiac arrest) and +CHF  + Valvular Problems/Murmurs AS  Rhythm:Regular Rate:Tachycardia     Neuro/Psych  PSYCHIATRIC DISORDERS Anxiety Depression     Neuromuscular disease (CRPS)    GI/Hepatic   Endo/Other    Renal/GU Renal disease     Musculoskeletal  (+) Arthritis ,    Abdominal   Peds  Hematology  (+) Blood dyscrasia, anemia   Anesthesia Other Findings   Reproductive/Obstetrics                              Anesthesia Physical Anesthesia Plan  ASA: 4  Anesthesia Plan: General   Post-op Pain Management:    Induction: Intravenous  PONV Risk Score and Plan: Ondansetron  Airway Management Planned: Oral ETT  Additional Equipment: Arterial line and TEE  Intra-op Plan:   Post-operative Plan: Post-operative intubation/ventilation  Informed Consent:   Plan Discussed with: CRNA  Anesthesia Plan Comments: (58A with LFLG severe AS and recent STEMI with VT arrest (s/p PCI to LAD), planned to undergo TAVI via femoral approach. Currently has IABP in place, intolerant to inotropes due to arrhythmias. Remains on lidocaine and amiodarone gtts.   Discussed risks and benefits with patient's daughters who consent for anesthetic care. )         Anesthesia Quick Evaluation

## 2023-02-04 NOTE — Anesthesia Postprocedure Evaluation (Signed)
Anesthesia Post Note  Patient: Madison West  Procedure(s) Performed: Transcatheter Aortic Valve Replacement, Transfemoral TRANSESOPHAGEAL ECHOCARDIOGRAM     Patient location during evaluation: SICU Anesthesia Type: General Level of consciousness: sedated and patient remains intubated per anesthesia plan Pain management: pain level controlled Vital Signs Assessment: post-procedure vital signs reviewed and stable Respiratory status: spontaneous breathing, patient on ventilator - see flowsheet for VS and patient remains intubated per anesthesia plan Cardiovascular status: stable Anesthetic complications: no   There were no known notable events for this encounter.  Last Vitals:  Vitals:   02/04/23 2215 02/04/23 2230  BP:    Pulse: (!) 103 (!) 104  Resp: 16 18  Temp: (!) 35.7 C (!) 35.9 C  SpO2: 97% 96%    Last Pain:  Vitals:   02/04/23 2000  TempSrc: Core  PainSc:                  [name]

## 2023-02-04 NOTE — Op Note (Addendum)
[facility]  TAVR OPERATIVE NOTE   Date of Procedure:  02/04/2023  Preoperative Diagnosis: Severe Aortic Stenosis and cardiogenic shock  Postoperative Diagnosis: Same   Procedure:   Transcatheter Aortic Valve Replacement - Transfemoral Approach  Edwards Sapien 3 Resilia THV (size 26 mm, model # 9755RLS, serial # [id number] )   Co-Surgeons:   Eugenio Hoes, MD and Alverda Skeans, MD Anesthesiologist:  [name], MD  Echocardiographer:  Thurmon Fair, MD  Pre-operative Echo Findings: Severe aortic stenosis Severe left ventricular systolic dysfunction  Post-operative Echo Findings: Trace paravalvular leak Moderate left ventricular systolic dysfunction  Left Heart Catheterization Findings: Left ventricular end-diastolic pressure of  Coronary angiography Findings: Patent LAD and left circumflex stents  BRIEF CLINICAL NOTE AND INDICATIONS FOR SURGERY  The patient is a 67 year old female with a history of coronary artery disease status post PCI of the circumflex and right coronary artery in 2021, breast cancer status postchemotherapy and radiation, hypertension, hyperlipidemia, and right carotid endarterectomy in 2020 who was admitted with acute heart failure exacerbation requiring diuresis.  She underwent cardiac catheterization which demonstrated a 95% mid LAD lesion that was treated with 1 drug-eluting stent.  She developed VF arrest and was found to have developed acute stent thrombosis that was treated with aspiration thrombectomy.  She developed cardiogenic shock and intra-aortic balloon pump was placed.  An echocardiogram demonstrated severe low-flow low gradient aortic stenosis.  Due to ongoing cardiogenic shock, respiratory failure in the setting of severe low-flow low gradient aortic stenosis she is referred for urgent transcatheter valve replacement with a 26 mm SAPIEN 3 valve from the left transfemoral approach.  During the course of the patient's  preoperative work up they have been evaluated comprehensively by a multidisciplinary team of specialists coordinated through the Multidisciplinary Heart Valve Clinic in the [facility].  They have been demonstrated to suffer from symptomatic severe aortic stenosis as noted above. The patient's family has been counseled extensively as to the relative risks and benefits of all options for the treatment of severe aortic stenosis including long term medical therapy, conventional surgery for aortic valve replacement, and transcatheter aortic valve replacement.  The patient has been independently evaluated by Dr. Leafy Ro with CT surgery and they are felt to be at high risk for conventional surgical aortic valve replacement. The surgeon indicated the patient would be a poor candidate for conventional surgery. Based upon review of all of the patient's preoperative diagnostic tests they are felt to be candidate for transcatheter aortic valve replacement using the transfemoral approach as an alternative to high risk conventional surgery.    Following the decision to proceed with transcatheter aortic valve replacement, a discussion has been held regarding what types of management strategies would be attempted intraoperatively in the event of life-threatening complications, including whether or not the patient would be considered a candidate for the use of cardiopulmonary bypass and/or conversion to open sternotomy for attempted surgical intervention.  The patient has been advised of a variety of complications that might develop peculiar to this approach including but not limited to risks of death, stroke, paravalvular leak, aortic dissection or other major vascular complications, aortic annulus rupture, device embolization, cardiac rupture or perforation, acute myocardial infarction, arrhythmia, heart block or bradycardia requiring permanent pacemaker placement, congestive heart failure, respiratory  failure, renal failure, pneumonia, infection, other late complications related to structural valve deterioration or migration, or other complications that might ultimately cause a temporary or permanent loss of functional independence or  other long term morbidity.  The patient provides full informed consent for the procedure as described and all questions were answered preoperatively.    DETAILS OF THE OPERATIVE PROCEDURE  PREPARATION:   The patient is brought to the operating room on the above mentioned date and central monitoring was established by the anesthesia team including placement of a radial arterial line. The patient is placed in the supine position on the operating table.  Intravenous antibiotics are administered. Conscious sedation is used.   Baseline transthoracic echocardiogram was performed. The patient's chest, abdomen, both groins, and both lower extremities are prepared and draped in a sterile manner. A time out procedure is performed.   PERIPHERAL ACCESS:   Using the modified Seldinger technique, venous access were obtained with placement of a 6 Fr sheath in the left common femoral vein and right internal jugular vein.  A previously placed left radial arterial line was exchanged for 6 [place] Terumo glide sheath.  A pigtail diagnostic catheter was passed through the left radial arterial sheath under fluoroscopic guidance into the aortic root.  A temporary transvenous pacemaker catheter was passed through the internal jugular venous sheath under fluoroscopic guidance into the right ventricle.  The pacemaker was tested to ensure stable lead placement and pacemaker capture. Aortic root angiography was performed in order to determine the optimal angiographic angle for valve deployment.  TRANSFEMORAL ACCESS:  A micropuncture kit was used to gain access to the left femoral artery using u/s guidance. Position confirmed with angiography. Pre-closure with double ProGlide closure devices.  The patient was heparinized systemically and ACT verified > 250 seconds.    A 14 French Fr transfemoral E-sheath was introduced into the left femoral artery after progressively dilating over an Amplatz superstiff wire with the intra-aortic balloon pump on standby.  With the sheath in place the balloon pump was activated to 1:3.  CORONARY ANGIOGRAPHY: Through the 14 French sheath, a 6 [place] JL 4 catheter was used for selective coronary angiography left system.  This demonstrated widely patent LAD and circumflex stents.   TRANSCATHETER HEART VALVE DEPLOYMENT:  An AL-1 catheter was used to direct a straight-tip exchange length wire across the native aortic valve into the left ventricle. This was exchanged out for a pigtail catheter and position was confirmed in the LV apex. Simultaneous left ventricular, aortic, and left ventricular end-diastolic pressures were recorded.  The pigtail catheter was then exchanged for an Safari wire in the LV apex.     An Edwards Sapien 3 THV (size 26 mm) was prepared and crimped per manufacturer's guidelines, and the proper orientation of the valve is confirmed on the [employer] delivery system. The valve was advanced through the introducer sheath using normal technique until in an appropriate position in the abdominal aorta beyond the sheath tip.  With the intra-aortic balloon pump on standby, the balloon was then retracted and using the fine-tuning wheel was centered on the valve. The valve was then advanced across the aortic arch using appropriate flexion of the catheter.  Intra-aortic balloon pump was then activated to 1:3. The valve was carefully positioned across the aortic valve annulus. The Commander catheter was retracted using normal technique. Once final position of the valve has been confirmed by angiographic assessment, the valve is deployed while temporarily holding ventilation and during rapid ventricular pacing with the balloon pump on standby to  maintain systolic blood pressure < 50 mmHg and pulse pressure < 10 mmHg. The balloon inflation is held for >3 seconds after reaching  full deployment volume. Once the balloon has fully deflated the balloon is retracted into the ascending aorta and valve function is assessed using TEE. There is felt to be trace paravalvular leak and no central aortic insufficiency.  The patient's hemodynamic recovery following valve deployment is good.  The deployment balloon and guidewire are both removed. Echo demostrated acceptable post-procedural gradients, stable mitral valve function, and no AI.   PROCEDURE COMPLETION:  The sheath was then removed and closure devices were completed. Protamine was administered once femoral arterial repair was complete. The IABP was re-activated to 1:1.  The temporary pacemaker, pigtail catheters and femoral and internal jugular sheaths were removed with  manual pressure used for venous hemostasis.  A TR band was placed on the left radial site once the sheath was removed.  The patient tolerated the procedure well and is transported to the surgical intensive care in stable condition. There were no immediate intraoperative complications. All sponge instrument and needle counts are verified correct at completion of the operation.   No blood products were administered during the operation.  The patient received a total of 85 mL of intravenous contrast during the procedure.  Orbie Pyo MD 02/04/2023 6:40 PM

## 2023-02-04 NOTE — Transfer of Care (Signed)
Immediate Anesthesia Transfer of Care Note  Patient: Madison West  Procedure(s) Performed: Transcatheter Aortic Valve Replacement, Transfemoral TRANSESOPHAGEAL ECHOCARDIOGRAM  Patient Location: PACU  Anesthesia Type:General  Level of Consciousness: Patient remains intubated per anesthesia plan  Airway & Oxygen Therapy: Patient remains intubated per anesthesia plan  Post-op Assessment: Report given to RN and Post -op Vital signs reviewed and stable  Post vital signs: Reviewed and stable  Last Vitals:  Vitals Value Taken Time  BP 90/60   Temp 35.8 C 02/04/23 1926  Pulse 170 02/04/23 1926  Resp 16 02/04/23 1926  SpO2 94 % 02/04/23 1926  Vitals shown include unfiled device data.  Last Pain:  Vitals:   02/04/23 0400  TempSrc: Core  PainSc:       Patients Stated Pain Goal: 0 (02/01/23 0800)  Complications: There were no known notable events for this encounter.

## 2023-02-04 NOTE — Progress Notes (Signed)
NAME:  Madison West, MRN:  [id number], DOB:  07-07-1955, LOS: 11 ADMISSION DATE:  01/23/2023, CONSULTATION DATE:  02/04/2023  REFERRING MD:  TRH, pokhrel, CHIEF COMPLAINT:  VT arrest   History of Present Illness:  66 yo F w/ pertinent PMH PSVT, HTN, HLD, R carotid artery stenosis s/p endarterectomy 2020, CAD s/p stenting 2021, moderate aortic stenosis presents to [facility] ED on 8/9 w/ SOB.   Patient has been having SOB that has been getting progressively worse. Patient states she has been gaining weight and feels fatigued. Patient also having some anginal chest pain similar to when she needed coronary interventions back in 2021. Patient admitted to [facility] on 8/9 and treated for new onset CHF. Echo showing LVEF 30-35%; regional wall motion abnormalities; moderate aortic stenosis. BNP 462. EKG showing PVC, LAE, LAFB w/ miniaml ST elevation. Patient underwent cardiac cath on 8.7 showing mid LAD 95% lesion w/ LAD stent placement. CT surgery consulted for severe aortic stenosis. Patient has been having episodes of non sustained v tach started on metoprolol.    On 8/11, patient on floors and had vtach w/ no pulse. CPR initiated, given epi, and shocked x2 with ROSC achieved. Patient alert after cpr event but slightly confused. Patient again went into Vtach rhythm and was shocked again. Given amio, mag, calcium. Patient started on amio drip. Patient transferred to [facility] ICU and cardiology consulted. EKG showing ST elevation in anterior leads. Code STEMI. PCCM consulted.  Pertinent  Medical History  PSVT, anxiety/depression,  breast cancer status post lumpectomy, chemo and radiation in 2009, hypertension,  hyperlipidemia,  right carotid artery stenosis status post carotid endarterectomy in 2020, CAD status post stent to LAD 8/7  Significant Hospital Events: Including procedures, antibiotic start and stop dates in addition to other pertinent events   01/31/2023 for left heart catheterization intervention on the  mid LAD. 8/13 overnight patient developed refractory VT shocked multiple times taken back to Cath Lab, intra-aortic balloon pump placed.  Stabilized intolerant of inotropes due to recurrent VT on amiodarone lidocaine and Neo-Synephrine. Was taken for consideration of VAECMO and then the decision was to not cannulate 8/15 plans for TAVR today   Interim History / Subjective:   Critically ill. Intubated on life support. Awaiting TAVR today.   Objective   Blood pressure 94/69, pulse (!) 193, temperature 99.7 F (37.6 C), resp. rate (!) 7, height 5\' 8"  (1.727 m), weight 104.6 kg, SpO2 98%. PAP: (25-48)/(17-29) 29/19 CVP:  [7 mmHg-29 mmHg] 7 mmHg CO:  [3.9 L/min-4.8 L/min] 3.9 L/min CI:  [2.06 L/min/m2-2.5 L/min/m2] 2.06 L/min/m2  Vent Mode: PRVC FiO2 (%):  [60 %] 60 % Set Rate:  [20 bmp] 20 bmp Vt Set:  [560 mL] 560 mL PEEP:  [5 cmH20] 5 cmH20 Plateau Pressure:  [17 cmH20-18 cmH20] 18 cmH20   Intake/Output Summary (Last 24 hours) at 02/04/2023 1013 Last data filed at 02/04/2023 0943 Gross per 24 hour  Intake 4989.01 ml  Output 3090 ml  Net 1899.01 ml   Filed Weights   02/02/23 0700 02/03/23 0630 02/04/23 0500  Weight: 96 kg 103.3 kg 104.6 kg    Examination: Gen. elderly female intubated on mechanical life support critically ill.  Balloon pump in place on multiple pressors sedation with a RASS -1 will open eyes to voice ENT NCAT tracking appropriately.  Green drainage in the nose. Neck: No significant JVD, right Cordis and Swan in place. Lungs: Bilateral ventilated breath sounds Cardiovascular: Regular rate rhythm S1-S2 Abdomen: Soft nontender nondistended Musculoskeletal:  No significant edema Neuro: Alert oriented following commands no deficit  Presenting: EKG shows ST elevations V1-V3 Rhythm on monitor initially showed runs of VT but then settled down into sinus tachycardia, bigeminy rhythm  Labs show mild leukocytosis, stable hemoglobin  Resolved Hospital Problem list      Assessment & Plan:   Refractory VT, cardiogenic shock VT arrest in the setting of recent LAD stent, EKG showing anterior wall STEMI, likely in-stent restenosis Acute coronary syndrome, taken to the Cath Lab with successful percutaneous intervention on the mid LAD Plan: Patient remains on amiodarone lidocaine Pressor support with multiple vasopressors as well as intra-aortic balloon pump. Has good urine output. Currently on Versed fentanyl and Precedex Propofol yesterday caused too much hypotension. Plans for TAVR today.  Fevers Plan: Continue broad-spectrum antibiotics, follow-up cultures  Chest wall pain, s/p CPR  P: On continuous fentanyl  Acute systolic heart failure -EF 30-35% with akinetic apex and septum on echocardiogram   Severe AS -Plan for TAVR placement today  Acute hypoxic respiratory failure, resolved   Hypertension -on losartan and amlodipine  Severe anxiety -on BuSpar and Xanax -Currently on Versed  Hyperglycemia  P: Recheck hyperglycemia    Best Practice (right click and "Reselect all SmartList Selections" daily)   Diet/type: NPO DVT prophylaxis: systemic heparin GI prophylaxis: N/A Lines: N/A Foley:  N/A Code Status:  full code Last date of multidisciplinary goals of care discussion spoke with patient's daughters.  Labs   CBC: Recent Labs  Lab 02/02/23 0252 02/02/23 0353 02/02/23 0603 02/02/23 0626 02/02/23 1725 02/02/23 1730 02/03/23 0025 02/03/23 0749 02/04/23 0207  WBC 18.2*  --  17.2*  --  14.6*  --  13.2*  --  8.8  HGB 11.6*   < > 11.5*   < > 11.4* 11.6* 11.3* 10.5* 9.9*  HCT 33.9*   < > 33.6*   < > 34.4* 34.0* 32.6* 31.0* 28.6*  MCV 92.9  --  93.1  --  93.2  --  94.5  --  92.6  PLT 410*  --  526*  --  378  --  356  --  270   < > = values in this interval not displayed.    Basic Metabolic Panel: Recent Labs  Lab 02/02/23 0603 02/02/23 0626 02/02/23 1724 02/02/23 1725 02/02/23 1730 02/03/23 0025 02/03/23 0749  02/03/23 1425 02/03/23 1603 02/04/23 0207  NA 123*   < >  --  123* 124* 125* 123*  --  126* 125*  K 4.0   < >  --  3.7 3.8 3.9 4.2  --  3.8 3.5  CL 94*  --   --  93*  --  93*  --   --  96* 97*  CO2 22  --   --  20*  --  21*  --   --  20* 20*  GLUCOSE 147*  --   --  161*  --  144*  --   --  151* 136*  BUN 14  --   --  17  --  15  --   --  12 14  CREATININE 0.73  --   --  0.78  --  0.83  --   --  0.88 0.74  CALCIUM 8.7*  --   --  7.8*  --  7.9*  --   --  7.6* 7.5*  MG 2.7*  --  2.3  --   --  1.8  --  2.1  --  2.0  PHOS 3.7  --   --   --   --   --   --  2.4*  --  2.3*   < > = values in this interval not displayed.   GFR: Estimated Creatinine Clearance: 84 mL/min (by C-G formula based on SCr of 0.74 mg/dL). Recent Labs  Lab 02/02/23 0559 02/02/23 0603 02/02/23 0818 02/02/23 1725 02/02/23 1732 02/03/23 0025 02/03/23 0738 02/03/23 0755 02/04/23 0207  PROCALCITON  --   --   --   --   --   --  0.24  --   --   WBC  --  17.2*  --  14.6*  --  13.2*  --   --  8.8  LATICACIDVEN 1.4  --  0.8  --  0.5  --   --  0.4*  --     Liver Function Tests: Recent Labs  Lab 02/02/23 0252  AST 180*  ALT 90*  ALKPHOS 39  BILITOT 0.6  PROT 5.4*  ALBUMIN 3.1*   No results for input(s): "LIPASE", "AMYLASE" in the last 168 hours. No results for input(s): "AMMONIA" in the last 168 hours.  ABG    Component Value Date/Time   PHART 7.295 (L) 02/03/2023 0749   PCO2ART 47.1 02/03/2023 0749   PO2ART 112 (H) 02/03/2023 0749   HCO3 22.6 02/03/2023 0749   TCO2 24 02/03/2023 0749   ACIDBASEDEF 4.0 (H) 02/03/2023 0749   O2SAT 73 02/04/2023 0400     Coagulation Profile: No results for input(s): "INR", "PROTIME" in the last 168 hours.  Cardiac Enzymes: No results for input(s): "CKTOTAL", "CKMB", "CKMBINDEX", "TROPONINI" in the last 168 hours.  HbA1C: Hgb A1c MFr Bld  Date/Time Value Ref Range Status  02/01/2023 05:29 AM 5.6 4.8 - 5.6 % Final    Comment:    (NOTE)         Prediabetes: 5.7 -  6.4         Diabetes: >6.4         Glycemic control for adults with diabetes: <7.0   10/15/2017 04:24 PM 5.8 (H) 4.8 - 5.6 % Final    Comment:             Prediabetes: 5.7 - 6.4          Diabetes: >6.4          Glycemic control for adults with diabetes: <7.0     CBG: Recent Labs  Lab 02/03/23 1600 02/03/23 2003 02/04/23 0003 02/04/23 0405 02/04/23 0811  GLUCAP 154* 143* 126* 146* 133*    This patient is critically ill with multiple organ system failure; which, requires frequent high complexity decision making, assessment, support, evaluation, and titration of therapies. This was completed through the application of advanced monitoring technologies and extensive interpretation of multiple databases. During this encounter critical care time was devoted to patient care services described in this note for 32 minutes.   Josephine Igo, DO [facility] 02/04/2023 10:13 AM

## 2023-02-04 NOTE — Progress Notes (Signed)
PT Cancellation Note  Patient Details Name: Madison West MRN: [id number] DOB: Nov 13, 1955   Cancelled Treatment:    Reason Eval/Treat Not Completed: Medical issues which prohibited therapy; patient remains intubated and noted for TAVR today.  PT will follow up another day.   [name] 02/04/2023, 9:04 AM Madison West, PT Acute Rehabilitation Services Office:[phone] 02/04/2023

## 2023-02-04 NOTE — Progress Notes (Addendum)
Advanced Heart Failure Rounding Note  PCP-Cardiologist: None   Subjective:    IABP at 1:1. On milrinone 0.25 + 8 NE. CO-OX 73%  SWAN #s CVP 8  PA 37/18 CO 4 CI 2 SVR 1200 PAPi 1.2  Fever and leukocytosis resolved. Remains on Vancomycin + Cefepime.  No recurrent VT. ? Ectopic atrial rhythm  Going for TAVR today.  Wakes up and follows commands.     Objective:   Weight Range: 104.6 kg Body mass index is 35.06 kg/m.   Vital Signs:   Temp:  [99.1 F (37.3 C)-100.9 F (38.3 C)] 99.9 F (37.7 C) (08/15 0700) Pulse Rate:  [74-257] 217 (08/15 0312) Resp:  [0-20] 20 (08/15 0700) BP: (84-120)/(60-107) 96/72 (08/15 0700) SpO2:  [96 %-99 %] 98 % (08/15 0700) Arterial Line BP: (79-128)/(39-69) 113/39 (08/15 0700) FiO2 (%):  [60 %-80 %] 60 % (08/15 0400) Weight:  [104.6 kg] 104.6 kg (08/15 0500) Last BM Date : 02/01/23  Weight change: Filed Weights   02/02/23 0700 02/03/23 0630 02/04/23 0500  Weight: 96 kg 103.3 kg 104.6 kg    Intake/Output:   Intake/Output Summary (Last 24 hours) at 02/04/2023 0704 Last data filed at 02/04/2023 0700 Gross per 24 hour  Intake 5382.74 ml  Output 3235 ml  Net 2147.74 ml      Physical Exam    General: Critically ill HEENT: normal Neck: supple. R internal jugular CVC Cor: PMI nondisplaced. Regular rate & rhythm. No rubs, gallops, 3/6 AS murmur Lungs: coarse Abdomen: soft, nontender, nondistended.  Extremities: no cyanosis, clubbing, rash, 1+ edema, RFA IABP Neuro: Sedated on vent     Telemetry   ? Ectopic atrial rhythm  Labs    CBC Recent Labs    02/03/23 0025 02/03/23 0749 02/04/23 0207  WBC 13.2*  --  8.8  HGB 11.3* 10.5* 9.9*  HCT 32.6* 31.0* 28.6*  MCV 94.5  --  92.6  PLT 356  --  270   Basic Metabolic Panel Recent Labs    13/24/40 1425 02/03/23 1603 02/04/23 0207  NA  --  126* 125*  K  --  3.8 3.5  CL  --  96* 97*  CO2  --  20* 20*  GLUCOSE  --  151* 136*  BUN  --  12 14  CREATININE  --   0.88 0.74  CALCIUM  --  7.6* 7.5*  MG 2.1  --  2.0  PHOS 2.4*  --  2.3*   Liver Function Tests Recent Labs    02/02/23 0252  AST 180*  ALT 90*  ALKPHOS 39  BILITOT 0.6  PROT 5.4*  ALBUMIN 3.1*   No results for input(s): "LIPASE", "AMYLASE" in the last 72 hours. Cardiac Enzymes No results for input(s): "CKTOTAL", "CKMB", "CKMBINDEX", "TROPONINI" in the last 72 hours.  BNP: BNP (last 3 results) Recent Labs    01/23/23 2208  BNP 462.3*    ProBNP (last 3 results) No results for input(s): "PROBNP" in the last 8760 hours.   D-Dimer No results for input(s): "DDIMER" in the last 72 hours. Hemoglobin A1C No results for input(s): "HGBA1C" in the last 72 hours.  Fasting Lipid Panel Recent Labs    02/04/23 0207  TRIG 91   Thyroid Function Tests No results for input(s): "TSH", "T4TOTAL", "T3FREE", "THYROIDAB" in the last 72 hours.  Invalid input(s): "FREET3"  Other results:   Imaging    DG CHEST PORT 1 VIEW  Result Date: 02/03/2023 CLINICAL DATA:  Heart failure EXAM:  PORTABLE CHEST 1 VIEW COMPARISON:  Same day chest radiograph FINDINGS: Endotracheal tube, enteric tube, right IJ approach pulmonary arterial catheter, and IABP marker are stable in positioning from chest radiograph performed earlier on the same day. Stable heart size. Pulmonary vascular congestion. Small left pleural effusion. Increasing streaky bibasilar opacities. No pneumothorax. IMPRESSION: 1. Increasing streaky bibasilar opacities, likely atelectasis. 2. Small left pleural effusion. 3. Stable support apparatus. Electronically Signed   By: Duanne Guess D.O.   On: 02/03/2023 13:20     Medications:     Scheduled Medications:  sodium chloride   Intravenous Once   ALPRAZolam  1 mg Oral Q6H   aspirin  81 mg Oral Daily   bisacodyl  5 mg Oral Once   busPIRone  5 mg Oral BID   Chlorhexidine Gluconate Cloth  6 each Topical Daily   famotidine  20 mg Oral BID   feeding supplement (PROSource TF20)  60  mL Per Tube QID   fentaNYL (SUBLIMAZE) injection  50 mcg Intravenous Once   furosemide  60 mg Intravenous Once   insulin aspart  2-6 Units Subcutaneous Q4H   lidocaine  1 patch Transdermal Q24H   magnesium sulfate  40 mEq Other To OR   mouth rinse  15 mL Mouth Rinse Q2H   potassium chloride  40 mEq Oral Daily   potassium chloride  80 mEq Other To OR   rosuvastatin  20 mg Oral Daily   sodium chloride flush  3 mL Intravenous Q12H   sodium chloride flush  3 mL Intravenous Q12H   sodium chloride flush  3 mL Intravenous Q12H   sodium chloride flush  3 mL Intravenous Q12H   ticagrelor  90 mg Oral BID    Infusions:  sodium chloride     sodium chloride Stopped (02/04/23 0338)   sodium chloride     sodium chloride     sodium chloride     sodium chloride     albumin human Stopped (02/03/23 1032)   amiodarone 60 mg/hr (02/04/23 0700)    ceFAZolin (ANCEF) IV Stopped (02/04/23 0339)   ceFEPime (MAXIPIME) IV Stopped (02/04/23 0047)   dexmedetomidine (PRECEDEX) IV infusion 0.5 mcg/kg/hr (02/04/23 0700)   dexmedetomidine Stopped (02/04/23 0340)   feeding supplement (VITAL AF 1.2 CAL) Stopped (02/04/23 0000)   fentaNYL infusion INTRAVENOUS 125 mcg/hr (02/04/23 0700)   heparin 30,000 units/NS 1000 mL solution for CELLSAVER Stopped (02/04/23 0340)   heparin 1,200 Units/hr (02/04/23 0700)   lidocaine 1.5 mg/min (02/04/23 0700)   midazolam 2 mg/hr (02/04/23 0700)   milrinone 0.25 mcg/kg/min (02/04/23 0700)   norepinephrine (LEVOPHED) Adult infusion 7 mcg/min (02/04/23 0700)   norepinephrine (LEVOPHED) Adult infusion Stopped (02/04/23 0340)   potassium PHOSPHATE IVPB (in mmol) 85 mL/hr at 02/04/23 0700   propofol (DIPRIVAN) infusion Stopped (02/03/23 0846)   sodium chloride     vancomycin Stopped (02/03/23 2212)    PRN Medications: sodium chloride, sodium chloride, sodium chloride, sodium chloride, Place/Maintain arterial line **AND** sodium chloride, acetaminophen, albumin human, alum & mag  hydroxide-simeth, calcium carbonate, fentaNYL, lidocaine, morphine injection, nitroGLYCERIN, ondansetron (ZOFRAN) IV, oxyCODONE, sodium chloride flush, sodium chloride flush, sodium chloride flush, sodium chloride flush    Patient Profile    67 y.o. female with history of CAD s/p PCI to RCA in 2021, carotid artery stenosis s/p R CEA, aortic valve stenosis. Admitted with unstable angina. Found to have severe LAD stenosis s/p PCI and severe low flow low gradient AS. Coarse c/b refractory VT and hemodynamic instability >>  cardiogenic shock.    Assessment/Plan   Acute systolic CHF >> cardiogenic shock -In setting of severe ischemic heart disease, reduced EF, severe AS and refractory VT/VF -Echo 01/25/23: EF 30-35%, septum and apex HK, RV low normal, low flow low graident severe AS with mean gradient 20 mmHg, AVA 0.93 cm2, DI 0.33 -echo 02/01/23: EF 20-25%, RV okay, severe low flow low gradient severe AS with mean gradient 25 mmHg, AVA 1.1 cm2 -s/p PCI/stent to LAD. Stent thrombosis on 08/09 treated with thrombectomy.  -IABP placed 08/13. Had considered [facility] ECMO but hemodynamics improved with inotrope support -IABP at 1:1. Continues on 0.25 milrinone + 8 NE. CI 2.  No VT overnight. - CVP 8. Net + last 24 hrs. Getting a lot of volume with gtts. Give 60 mg lasix IV. Reassess post TAVR this afternoon.  2. Refractory VT -S/p multiple shocks.  -Now on lidocaine and amiodarone gtts -No recurrent VT -? Able to stop lidocaine post TAVR today -K 3.5 and Mag 2.0. Supp aggressively.    3. CAD NSTEMI -HX PCI to RCA in 2021 -Presented with unstable angina. S/p PCI/stent to LAD on 08/07. Stent thrombosis 08/09 treated with thrombectomy -HS troponin > 24K on 08/12 -LHC 08/12 with patent LAD stent -Reassessing cors on TAVR today -Continue DAPT. Had been declining statin.   4. LFLG Severe AS -Noted on echo this admit -Not candidate for SAVR -Has been seen by structural heart team. Planning for TAVR  today. Has iRBBB and 1st degree AV block.  5. Carotid artery stenosis -S/p R CEA in 2020  6. ID -Developed sepsis 08/13. Now on cefepime + vancomycin -Fever resolved.  -BC X 2 pending. Nasal culture rare gram + cocci and rare gram + rods. Resp culture rare gram + cocci in pairs.  7. Respiratory failure -Vent management per CCM  8. Hyponatremia -Hypervolemic -Na 125, give Tolvaptan if drops further    Length of Stay: 11  FINCH, LINDSAY N, PA-C  02/04/2023, 7:04 AM  Advanced Heart Failure Team Pager [phone] (M-F; 7a - 5p)  Please contact [facility] for night-coverage after hours (5p -7a ) and weekends on [email or web address]  Agree with above.   Awake on vent following commands. Now afebrile. Remains on milrinone, NE and IABP. Hemodynamics improved but still tenuous.   General:  Awake on vent HEENT: normal + ETT Neck: supple. RIJ swan Carotids 2+ bilat; no bruits. No lymphadenopathy or thryomegaly appreciated. Cor: Regular. + AS Lungs: clear Abdomen: obese soft, nontender, nondistended. No hepatosplenomegaly. No bruits or masses. Good bowel sounds. Extremities: no cyanosis, clubbing, rash, 1-2+  edema RFA IABP site ok  Neuro: awake on vent following commands  Remains critically ill but improving. Long discussion with Structural team about next steps. As per my note from last night, I think TAVR - although high risk,  is the best option to help her recover and hopefully get a meaningful Qol in setting of her recent MI and severe LV dysfunction.   CRITICAL CARE Performed by: Arvilla Meres  Total critical care time: 60 minutes  Critical care time was exclusive of separately billable procedures and treating other patients.  Critical care was necessary to treat or prevent imminent or life-threatening deterioration.  Critical care was time spent personally by me (independent of midlevel providers or residents) on the following activities: development of treatment plan with  patient and/or surrogate as well as nursing, discussions with consultants, evaluation of patient's response to treatment, examination of patient, obtaining history from patient or  surrogate, ordering and performing treatments and interventions, ordering and review of laboratory studies, ordering and review of radiographic studies, pulse oximetry and re-evaluation of patient's condition.  Arvilla Meres, MD  12:41 PM

## 2023-02-04 NOTE — Progress Notes (Signed)
"  Gas Loss in IAB Circuit" alarm @ 14:52:26    The circuit was inspected and no leaks or breaks in circuit were apparent. I unplugged and re-plugged the gas tubing back into the IABP machine and resumed treatment with no further issue. The helium tank is currently more than 3/4 full.  Pt Hemodynamics remained stable throughout event, no signs or symptoms of bleeding at IABP insertion site noted at this time.

## 2023-02-04 NOTE — Progress Notes (Addendum)
[facility]   MULTIDISCIPLINARY HEART VALVE TEAM  Patient Name: Madison West Date of Encounter: 02/04/2023  Admit date: 01/23/2023  Primary Care Provider: [name], MD [facility] Cardiologist: Dr. Jacinto Halim / Patwatdhan   [facility] Electrophysiologist:  None   Hospital Problem List     Principal Problem:   Cardiogenic shock [facility]) Active Problems:   Hypertension   Anxiety   Unstable angina (HCC)   Coronary artery disease involving native coronary artery of native heart with unstable angina pectoris (HCC)   Pure hypercholesterolemia   CHF (congestive heart failure) (HCC)   Atherosclerotic heart disease   Acute HFrEF (heart failure with reduced ejection fraction) (HCC)   Nonrheumatic aortic valve stenosis   Former smoker   Hypertensive heart disease with acute systolic congestive heart failure (HCC)   Bilateral carotid artery stenosis   HFrEF (heart failure with reduced ejection fraction) (HCC)   ST elevation myocardial infarction involving left anterior descending (LAD) coronary artery (HCC)   Coronary stent thrombosis   Torsades de pointes (HCC)   Ventricular fibrillation (HCC)     Subjective   Intubated and sedated currently but previously was awake and following commands.  Daughters at bedside. Given pamphlets about TAVR.   Inpatient Medications    Scheduled Meds:  sodium chloride   Intravenous Once   ALPRAZolam  1 mg Oral Q6H   aspirin  81 mg Oral Daily   bisacodyl  5 mg Oral Once   busPIRone  5 mg Oral BID   [START ON 02/05/2023] chlorhexidine  15 mL Mouth/Throat Once   Chlorhexidine Gluconate Cloth  6 each Topical Daily   Chlorhexidine Gluconate Cloth  6 each Topical Once   famotidine  20 mg Oral BID   feeding supplement (PROSource TF20)  60 mL Per Tube QID   fentaNYL (SUBLIMAZE) injection  50 mcg Intravenous Once   insulin aspart  2-6 Units Subcutaneous Q4H   lidocaine  1 patch Transdermal Q24H   magnesium sulfate  40 mEq  Other To OR   [START ON 02/05/2023] metoprolol tartrate  12.5 mg Per Tube Once   midazolam  5 mg Intravenous Once   mupirocin ointment  1 Application Nasal BID   mouth rinse  15 mL Mouth Rinse Q2H   potassium chloride  80 mEq Other To OR   rosuvastatin  20 mg Oral Daily   sodium chloride flush  3 mL Intravenous Q12H   sodium chloride flush  3 mL Intravenous Q12H   sodium chloride flush  3 mL Intravenous Q12H   sodium chloride flush  3 mL Intravenous Q12H   ticagrelor  90 mg Oral BID   Continuous Infusions:  sodium chloride     sodium chloride Stopped (02/04/23 0338)   sodium chloride     sodium chloride     sodium chloride     sodium chloride     albumin human Stopped (02/03/23 1032)   amiodarone 60 mg/hr (02/04/23 0946)    ceFAZolin (ANCEF) IV Stopped (02/04/23 0339)   ceFEPime (MAXIPIME) IV Stopped (02/04/23 0825)   dexmedetomidine (PRECEDEX) IV infusion 0.5 mcg/kg/hr (02/04/23 0900)   dexmedetomidine Stopped (02/04/23 0340)   feeding supplement (VITAL AF 1.2 CAL) Stopped (02/04/23 0000)   fentaNYL infusion INTRAVENOUS 125 mcg/hr (02/04/23 0900)   heparin 30,000 units/NS 1000 mL solution for CELLSAVER Stopped (02/04/23 0340)   heparin 1,200 Units/hr (02/04/23 0949)   lidocaine 1.5 mg/min (02/04/23 0900)   magnesium sulfate bolus IVPB 2 g (02/04/23 0918)  midazolam 2 mg/hr (02/04/23 0900)   milrinone 0.25 mcg/kg/min (02/04/23 0900)   norepinephrine (LEVOPHED) Adult infusion 8 mcg/min (02/04/23 0900)   norepinephrine (LEVOPHED) Adult infusion Stopped (02/04/23 0340)   potassium chloride 10 mEq (02/04/23 0947)   propofol (DIPRIVAN) infusion Stopped (02/03/23 0846)   sodium chloride     vancomycin 1,000 mg (02/04/23 0948)   PRN Meds: sodium chloride, sodium chloride, sodium chloride, sodium chloride, Place/Maintain arterial line **AND** sodium chloride, acetaminophen, albumin human, alum & mag hydroxide-simeth, calcium carbonate, fentaNYL, lidocaine, midazolam, morphine  injection, nitroGLYCERIN, ondansetron (ZOFRAN) IV, oxyCODONE, sodium chloride flush, sodium chloride flush, sodium chloride flush, sodium chloride flush   Vital Signs    Vitals:   02/04/23 0815 02/04/23 0830 02/04/23 0845 02/04/23 0900  BP:    94/69  Pulse: (!) 196 (!) 190 (!) 198 (!) 193  Resp: 11 12 (!) 0 (!) 7  Temp: 99.7 F (37.6 C) 99.9 F (37.7 C) 99.7 F (37.6 C) 99.7 F (37.6 C)  TempSrc:      SpO2: 98% 97% 98% 98%  Weight:      Height:        Intake/Output Summary (Last 24 hours) at 02/04/2023 1011 Last data filed at 02/04/2023 0943 Gross per 24 hour  Intake 4989.01 ml  Output 3090 ml  Net 1899.01 ml   Filed Weights   02/02/23 0700 02/03/23 0630 02/04/23 0500  Weight: 96 kg 103.3 kg 104.6 kg    Physical Exam    General: intubated and sedated. HEENT: intubated Lymph: no adenopathy Neck: no JVD, R internal jugular swan. Cardiac:  normal S1, S2; RRR; 3/6 harsh SEM Lungs: bilateral mechanically ventilated breath sounds  Abd: soft, nontender, no hepatomegaly  Ext: has some mild edema in legs bilaterally and in hands.  Musculoskeletal:  No deformities, BUE and BLE strength normal and equal Skin: warm and dry  Neuro:  sedated on vent  Labs    CBC Recent Labs    02/03/23 0025 02/03/23 0749 02/04/23 0207  WBC 13.2*  --  8.8  HGB 11.3* 10.5* 9.9*  HCT 32.6* 31.0* 28.6*  MCV 94.5  --  92.6  PLT 356  --  270   Basic Metabolic Panel Recent Labs    16/10/96 1425 02/03/23 1603 02/04/23 0207  NA  --  126* 125*  K  --  3.8 3.5  CL  --  96* 97*  CO2  --  20* 20*  GLUCOSE  --  151* 136*  BUN  --  12 14  CREATININE  --  0.88 0.74  CALCIUM  --  7.6* 7.5*  MG 2.1  --  2.0  PHOS 2.4*  --  2.3*   Liver Function Tests Recent Labs    02/02/23 0252  AST 180*  ALT 90*  ALKPHOS 39  BILITOT 0.6  PROT 5.4*  ALBUMIN 3.1*   No results for input(s): "LIPASE", "AMYLASE" in the last 72 hours. Cardiac Enzymes No results for input(s): "CKTOTAL", "CKMB",  "CKMBINDEX", "TROPONINI" in the last 72 hours. BNP Invalid input(s): "POCBNP" D-Dimer No results for input(s): "DDIMER" in the last 72 hours. Hemoglobin A1C No results for input(s): "HGBA1C" in the last 72 hours.  Fasting Lipid Panel Recent Labs    02/04/23 0207  TRIG 91   Thyroid Function Tests No results for input(s): "TSH", "T4TOTAL", "T3FREE", "THYROIDAB" in the last 72 hours.  Invalid input(s): "FREET3"  Telemetry    Sinus with frequent PVCs  - Personally Reviewed  ECG  Sinus with IRBBB and 1st deg AV block, frequent PVCs, HR 89 - Personally Reviewed  Radiology    DG CHEST PORT 1 VIEW  Result Date: 02/03/2023 CLINICAL DATA:  Heart failure EXAM: PORTABLE CHEST 1 VIEW COMPARISON:  Same day chest radiograph FINDINGS: Endotracheal tube, enteric tube, right IJ approach pulmonary arterial catheter, and IABP marker are stable in positioning from chest radiograph performed earlier on the same day. Stable heart size. Pulmonary vascular congestion. Small left pleural effusion. Increasing streaky bibasilar opacities. No pneumothorax. IMPRESSION: 1. Increasing streaky bibasilar opacities, likely atelectasis. 2. Small left pleural effusion. 3. Stable support apparatus. Electronically Signed   By: Duanne Guess D.O.   On: 02/03/2023 13:20   DG Chest Port 1 View  Result Date: 02/03/2023 CLINICAL DATA:  Intra-aortic balloon pump. EXAM: PORTABLE CHEST 1 VIEW COMPARISON:  02/02/2023 FINDINGS: The cardio pericardial silhouette is enlarged. Low volume film. Interval increase in streaky opacity in the right mid lung, potentially atelectasis. Left base atelectasis or infiltrate with small effusion is similar to prior. Endotracheal tube tip is approximately 3.4 cm above the carina. The NG tube passes into the stomach although the distal tip position is not included on the film. Right IJ pulmonary artery catheter tip is in the interlobar pulmonary artery. Left IJ central line tip overlies the  mid SVC level. The tip of the intra-aortic balloon pump overlies the proximal descending thoracic aorta. Telemetry leads overlie the chest. IMPRESSION: 1. Low volume film with interval increase in streaky opacity in the right mid lung, potentially atelectasis. 2. Persistent left base atelectasis or infiltrate with small effusion. 3. Support apparatus as described. Electronically Signed   By: Kennith Center M.D.   On: 02/03/2023 07:56   CARDIAC CATHETERIZATION  Result Date: 02/02/2023 Images from the original result were not included. Finals set of RHC numbers on milrinone 0.375 mics, norepinephrine at 8 mic/min CO: 3.5 L/min CI: 1.7 L/min/m2 Elder Negus, MD Pager: [phone] Office: [phone]  DG Abd 1 View  Result Date: 02/02/2023 CLINICAL DATA:  Orogastric tube placement. EXAM: ABDOMEN - 1 VIEW COMPARISON:  CT 01/28/2023 FINDINGS: Limited field of view for tube placement evaluation. An enteric tube has been placed with tip projecting in the mid abdomen to the right of midline consistent with location in the distal stomach. There is evidence of left pleural effusion with basilar atelectasis or infiltration. IMPRESSION: Enteric tube tip projects over the right mid abdomen consistent with location in the distal stomach. Electronically Signed   By: Burman Nieves M.D.   On: 02/02/2023 17:46    Cardiac Studies   CARDIAC CATHETERIZATION   CARDIAC CATHETERIZATION 02/02/2023   Narrative Images from the original result were not included. 1. Ultrasound guided right common femoral artery access 2. Intraaortic balloon pump placement   In case of recurrent VT/VT or hemodynamic decompensation, will consider MCS escalation.     Elder Negus, MD Pager: [phone] Office: [phone]     CARDIAC CATHETERIZATION 02/02/2023   Narrative Images from the original result were not included.     Aggressive GDMT for HFrEF   Right and left heart catheterization 02/02/2023: LM:  Normal LAD: Patnet mid LAD stent with no restenosis.stent thrombosis Lcx: Mid diffuse 50% disease Ramus: Patent stent. No restenosis RCA: Mid eccentric 40% disease. Rest mild diffuse disease   RA: 21 mmHg PA: 50/29 mmHg, mPAP 38 mmHg PCW: 29 mmHg   LV: 127/12 mmHg Ao: 102/77 mmHg CO: 5.5 L/min CI: 2.7 L/min/m2   Swan catheter left  in place. IV lasix 20X2 administered in cath lab. Continue IV amiodarone. Needs aggressive HFrEF management. I had a long conversation with patient and family regarding importance of HFrEF management. Will get heart failure team consult.     Elder Negus, MD Pager: [phone] Office: [phone]   Findings Coronary Findings Diagnostic  Dominance: Right   Left Anterior Descending Vessel is normal in caliber. Vessel is angiographically normal. Non-stenotic Mid LAD-1 lesion was previously treated. The lesion is thrombotic. Previously placed stent displays rethrombosis. Non-stenotic Mid LAD-2 lesion was previously treated. The lesion is mildly calcified. Mid LAD-3 lesion is 40% stenosed.   Ramus Intermedius Vessel is large. Non-stenotic Ramus-1 lesion was previously treated. The lesion is concentric. Non-stenotic Ramus-2 lesion with 0% stenosed side branch in Lat Ramus was previously treated. The lesion is type C and concentric. The lesion is calcified.   Lateral Ramus Intermedius Vessel is large in size.   Left Circumflex Prox Cx to Dist Cx lesion is 50% stenosed.   Right Coronary Artery The vessel exhibits minimal luminal irregularities. Mid RCA lesion is 40% stenosed.   Intervention   No interventions have been documented.   STRESS TESTS   MYOCARDIAL PERFUSION IMAGING 04/04/2018   ECHOCARDIOGRAM   ECHOCARDIOGRAM COMPLETE 02/01/2023   Narrative ECHOCARDIOGRAM REPORT       Patient Name:   LETIA TRUMM Westendorf Date of Exam: 02/01/2023 Medical Rec #:  [id number]           Height:       68.0 in Accession #:     [id number]          Weight:       205.2 lb Date of Birth:  28-Feb-1956           BSA:          2.066 m Patient Age:    67 years            BP:           108/67 mmHg Patient Gender: F                   HR:           90 bpm. Exam Location:  Inpatient   Procedure: 2D Echo, Cardiac Doppler, Color Doppler and Intracardiac Opacification Agent   Indications:    Cardiac arrest I46.9   History:        Patient has prior history of Echocardiogram examinations, most recent 01/25/2023. Aortic Valve Disease; Risk Factors:Hypertension.   Sonographer:    Darlys Gales Referring Phys: [id number] Beulah Gandy PAYNE   IMPRESSIONS     1. Left ventricular ejection fraction, by estimation, is 20 to 25%. The left ventricle has severely decreased function. The left ventricle demonstrates regional wall motion abnormalities (see scoring diagram/findings for description). The left ventricular internal cavity size was moderately dilated. There is mild concentric left ventricular hypertrophy. Left ventricular diastolic parameters are indeterminate. 2. Right ventricular systolic function is normal. The right ventricular size is normal. Tricuspid regurgitation signal is inadequate for assessing PA pressure. 3. Left atrial size was moderately dilated. 4. The mitral valve is normal in structure. No evidence of mitral valve regurgitation. No evidence of mitral stenosis. 5. The aortic valve is calcified. Aortic valve regurgitation is not visualized. Mild to moderate aortic valve stenosis. Aortic valve area, by VTI measures 1.10 cm. Aortic valve mean gradient measures 24.5 mmHg. Aortic valve Vmax measures 2.96 m/s. 6. The inferior vena cava is normal in size with  greater than 50% respiratory variability, suggesting right atrial pressure of 3 mmHg.   FINDINGS Left Ventricle: Left ventricular ejection fraction, by estimation, is 20 to 25%. The left ventricle has severely decreased function. The left ventricle demonstrates regional wall  motion abnormalities. Definity contrast agent was given IV to delineate the left ventricular endocardial borders. The left ventricular internal cavity size was moderately dilated. There is mild concentric left ventricular hypertrophy. Left ventricular diastolic parameters are indeterminate.     LV Wall Scoring: The anterior septum and apex are akinetic. The entire anterior wall, antero-lateral wall, inferior septum, entire inferior wall, and apical lateral segment are hypokinetic.   Right Ventricle: The right ventricular size is normal. No increase in right ventricular wall thickness. Right ventricular systolic function is normal. Tricuspid regurgitation signal is inadequate for assessing PA pressure.   Left Atrium: Left atrial size was moderately dilated.   Right Atrium: Right atrial size was normal in size.   Pericardium: There is no evidence of pericardial effusion.   Mitral Valve: The mitral valve is normal in structure. No evidence of mitral valve regurgitation. No evidence of mitral valve stenosis.   Tricuspid Valve: The tricuspid valve is normal in structure. Tricuspid valve regurgitation is not demonstrated. No evidence of tricuspid stenosis.   Aortic Valve: The aortic valve is calcified. Aortic valve regurgitation is not visualized. Mild to moderate aortic stenosis is present. Aortic valve mean gradient measures 24.5 mmHg. Aortic valve peak gradient measures 35.0 mmHg. Aortic valve area, by VTI measures 1.10 cm.   Pulmonic Valve: The pulmonic valve was normal in structure. Pulmonic valve regurgitation is not visualized. No evidence of pulmonic stenosis.   Aorta: The aortic root is normal in size and structure.   Venous: The inferior vena cava is normal in size with greater than 50% respiratory variability, suggesting right atrial pressure of 3 mmHg.   IAS/Shunts: No atrial level shunt detected by color flow Doppler.     LEFT VENTRICLE PLAX 2D LVIDd:         5.50 cm    Diastology LVIDs:         5.20 cm   LV e' medial:    9.14 cm/s LV PW:         1.20 cm   LV E/e' medial:  8.9 LV IVS:        1.10 cm   LV e' lateral:   4.13 cm/s LVOT diam:     1.80 cm   LV E/e' lateral: 19.7 LV SV:         70 LV SV Index:   34 LVOT Area:     2.54 cm     RIGHT VENTRICLE             IVC RV S prime:     12.00 cm/s  IVC diam: 2.50 cm TAPSE (M-mode): 3.4 cm   LEFT ATRIUM             Index        RIGHT ATRIUM          Index LA Vol (A2C):   79.7 ml 38.57 ml/m  RA Area:     9.99 cm LA Vol (A4C):   86.3 ml 41.76 ml/m  RA Volume:   17.90 ml 8.66 ml/m LA Biplane Vol: 87.1 ml 42.15 ml/m AORTIC VALVE AV Area (Vmax):    1.07 cm AV Area (Vmean):   0.98 cm AV Area (VTI):     1.10 cm AV Vmax:  296.00 cm/s AV Vmean:          242.500 cm/s AV VTI:            0.638 m AV Peak Grad:      35.0 mmHg AV Mean Grad:      24.5 mmHg LVOT Vmax:         125.00 cm/s LVOT Vmean:        93.100 cm/s LVOT VTI:          0.275 m LVOT/AV VTI ratio: 0.43   MITRAL VALVE MV Area (PHT): 4.33 cm     SHUNTS MV Decel Time: 175 msec     Systemic VTI:  0.28 m MV E velocity: 81.20 cm/s   Systemic Diam: 1.80 cm MV A velocity: 133.00 cm/s MV E/A ratio:  0.61   Kardie Tobb DO Electronically signed by Thomasene Ripple DO Signature Date/Time: 02/01/2023/11:38:32 AM       Final     CT SCANS   CT CORONARY MORPH W/CTA COR W/SCORE 01/28/2023   Addendum 01/28/2023  5:37 PM ADDENDUM REPORT: 01/28/2023 17:01   CLINICAL DATA:  Aortic stenosis   EXAM: Cardiac TAVR CT   TECHNIQUE: The patient was scanned on a Siemens Force 192 slice scanner. A 120 kV retrospective scan was triggered in the descending thoracic aorta at 111 HU's. Gantry rotation speed was 270 msecs and collimation was .9 mm. No beta blockade or nitro were given. The 3D data set was reconstructed in 5% intervals of the R-R cycle. Systolic and diastolic phases were analyzed on a dedicated work station using MPR, MIP and VRT  modes. The patient received 80 cc of contrast.   FINDINGS: Aortic Valve: Tri leaflet aortic valve calcified with restricted motion Calcium Score 2137 Large area of bulky calcification involving the non coronary cusp   Aorta: No aneurysm moderate calcific atherosclerosis   Sinotubular Junction: 23 mm   Ascending Thoracic Aorta: 32 mm   Aortic Arch: 30 mm   Descending Thoracic Aorta: 23 mm   Sinus of Valsalva Measurements:   Non-coronary: 30 mm   height 21 mm   Right - coronary: 29.4 mm  height 19 mm   Left - coronary: 28.1 mm   height 20.5 mm   Coronary Artery Height above Annulus:   Left Main: 13.9 mm above annulus   Right Coronary: 15.3 mm above annulus   Virtual Basal Annulus Measurements:   Maximum/Minimum Diameter: 25.1 mm x 22.3 mm Average diameter 23.7 mm   Perimeter: 75.2 mm   Area: 440 mm2   Coronary Arteries: Sufficient height above annulus for deployment   Optimum Fluoroscopic Angle for Delivery: LAO 1 Caudal 2 degrees   Membranous septal length 5.3 mm   IMPRESSION: 1. Calcified tri leaflet AV with score 2137 Bulky calcium noted on non coronary cusp   2. Annular area 440 mm2 suitable on lower end for 26 mm Sapien Ultra valve. Left coronary sinus small to consider 29 mm Medtronic Evolut   3.  Optimum angiographic angle for deployment LAO 1 Caudal 2 degrees   4.  Membranous septal length 5.3 mm   5.  Coronary arteries sufficient height above annulus for deployment   Charlton Haws     Electronically Signed By: Charlton Haws M.D. On: 01/28/2023 17:01   Narrative EXAM: OVER-READ INTERPRETATION  CT CHEST   The following report is a limited chest CT over-read performed by radiologist Dr. Allegra Lai of [facility] on 01/28/2023. This over-read does not include  interpretation of cardiac or coronary anatomy or pathology. The cardiac TAVR interpretation by the cardiologist is attached.   COMPARISON:  None Available.    FINDINGS: Extracardiac findings will be described separately under dictation for contemporaneously obtained CTA chest, abdomen and pelvis.   IMPRESSION: Please see separate dictation for contemporaneously obtained CTA chest, abdomen and pelvis dated 01/28/2023 for full description of relevant extracardiac findings.   Electronically Signed: By: Allegra Lai M.D. On: 01/28/2023 16:24  Patient Profile     Genaveve Ortez is a 67 y.o. female with a hx of CAD s/p PCI to the RCA 02/2020, PSVT terminated with adenosine, breast cancer s/p chemo/XRT in 2009, HTN, HLD, s/p R CEA 2020, obesity, former smoker, extreme anxiety with multiple medication intolerances/phobias, and aortic stenosis who is being seen today for the evaluation of severe LFLG AS in the setting of recurrent VT arrest and cardiogenic shock at the request of Dr. Gala Romney.   Assessment & Plan    Severe LFLG AS: plan is for tentative TAVR this afternoon with Drs. Dorreen Valiente and [employer]. Plan 26 mm Edwards S3UR via the left TF approach. Right radial art line placed by Dr. Gala Romney.   Fevers: developed fevers with a Tmax 100.9. White count up to 13.2 but now resolved. Peripheral lines aside from her balloon pump have been removed. On broad spectrum Abx. Procalcitonin negative. Blood cultures with NGTD.  Acute systolic CHF with cardiogenic shock: IABP at 1:1. On milrinone 0.25 + 8 NE. CO-OX 73%.  SWAN #s CVP 8  PA 37/18 CO 4 CI 2 SVR 1200 PAPi 1.2 Has good UOP. Net negative 6.8L but positive overnight. Given IV lasix 60mg  this AM and plan to reassess after TAVR. Appreciate advanced CHF team.  Refractory VT: s/p multiple shocks. Still with frequent ventricular ectopy but no recent VT on lidocaine and amiodarone gtts. Electrolytes supplemented.   CAD: hx PCI to RCA in 2021. -Presented with unstable angina. S/p PCI/stent to LAD on 08/07. Stent thrombosis 08/09 treated with thrombectomy -HS troponin > 24K on 08/12 -LHC  08/12 with patent LAD stent -Continue DAPT with aspirin and Brilinta. Declined statin therapy in the past.   Hyponatremia: Na 125. Per heart failure, will start on Tolvaptan is drops below this   Carotid artery stenosis: s/p R CEA in 2020  Respiratory failure: sedated on vent. Had to escalate anxiolytics and sedation due to anxiety on vent. Now seems much more comfortable. Will wake up and follow commands. Appreciate PCCM   Signed, Cline Crock, PA-C  02/04/2023, 10:11 AM  Pager [phone]   ATTENDING ATTESTATION:  After conducting a review of all available clinical information with the care team, interviewing the patient, and performing a physical exam, I agree with the findings and plan described in this note.   GEN: Intubated, sedated  HEENT:  Intubated, RIJ swan in place Cardiac: RRR, 3/6 SEM + IABP sounds Respiratory: Ant lung fields clear GI: Soft, nontender, non-distended  MS: No edema; No deformity. Neuro:  Nonfocal  Vasc:  Left radial arterial line, R IABP  Low-grade fevers and leukocytosis are improved on broad-spectrum antibiotics and removal of the left common femoral arterial and venous sheaths.  The patient's cardiac output is 4 L/min with a cardiac index of 2 L/min/m.  Her CVP is 8 with a PA diastolic pressure of 18.  She has had no recurrent VT while on lidocaine and amiodarone.  I did have a long conversation with the patient's family.  Given her complicated course and  now severe LV dysfunction and cardiogenic shock requiring intraaortic balloon pump her road to recovery is limited by the development of severe aortic stenosis.  We have evaluated the patient for an urgent TAVR procedure as she has been turned down by surgery for surgical aortic valve replacement in the past.  We will proceed to urgent transcatheter valve replacement from the left transfemoral approach.  Will plan on placing a right IJ temporary pacemaker as she has a first-degree AV block and an  incomplete right bundle branch block and her membranous septum length is 5 mm.  She additionally has demonstrated some isorhythmic dissociative rhythms on telemetry.  We will place a right arterial line for anesthesia and we will use the left arterial line to exchange for a 6 French sheath as her secondary access for TAVR.  I explained in great detail the risks and benefits of TAVR including vascular injury, stroke, VT or VF arrest requiring CPR, acute kidney injury, and death.  The patient's 2 daughters agreed to proceed.  Will plan on TAVR later today.  CRITICAL CARE Performed by: Alverda Skeans   Total critical care time: 30 minutes. Critical care time was exclusive of separately billable procedures and treating other patients. Critical care was necessary to treat or prevent imminent or life-threatening deterioration. Critical care was time spent personally by me on the following activities: development of treatment plan with patient and/or surrogate as well as nursing, discussions with consultants, evaluation of patient's response to treatment, examination of patient, obtaining history from patient or surrogate, ordering and performing treatments and interventions, ordering and review of laboratory studies, ordering and review of radiographic studies, pulse oximetry and re-evaluation of patient's condition.   Alverda Skeans, MD Pager [phone]

## 2023-02-04 NOTE — Op Note (Signed)
[facility]   MULTIDISCIPLINARY HEART VALVE TEAM   TAVR OPERATIVE NOTE   Date of Procedure:  02/04/2023  Preoperative Diagnosis: Severe Aortic Stenosis   Postoperative Diagnosis: Same   Procedure:   Transcatheter Aortic Valve Replacement - Percutaneous left Transfemoral Approach  Edwards Sapien 3 Ultra THV (size 6 mm, model # [id number])   Co-Surgeons:  Eugenio Hoes MD and  Alverda Skeans, MD   Anesthesiologist:  Roslynn Amble MD  Echocardiographer:  Dr Royann Shivers  Pre-operative Echo Findings: Severe aortic stenosis       Depressed  left ventricular systolic function  Post-operative Echo Findings: no paravalvular leak Depressed left ventricular systolic function   BRIEF CLINICAL NOTE AND INDICATIONS FOR SURGERY  67 yo female with severe LV dysfunction following PCI to LAD with instent stenosis with associated severe AS. Pt was felt not a surgical candidate and felt only TAVR was option for her.    DETAILS OF THE OPERATIVE PROCEDURE  PREPARATION:    The patient was brought to the operating room on the above mentioned date and appropriate monitoring was established by the anesthesia team. The patient was placed in the supine position on the operating table.  Intravenous antibiotics were administered. The patient was monitored closely throughout the procedure under   General endotracheal anesthesia which was already in place  Baseline transesophageal echocardiogram was performed. The patient's abdomen and both groins were prepped and draped in a sterile manner. A time out procedure was performed.   PERIPHERAL ACCESS:    Using the modified Seldinger technique, left radial arterial and right internal jugular venous access was obtained. A pigtail diagnostic catheter was passed through the left radial arterial sheath under fluoroscopic guidance into the aortic root.  A temporary transvenous pacemaker catheter was passed through the right internal jugular venous  sheath under fluoroscopic guidance into the right ventricle.  The pacemaker was tested to ensure stable lead placement and pacemaker capture. Aortic root angiography was performed in order to determine the optimal angiographic angle for valve deployment.   TRANSFEMORAL ACCESS:   Percutaneous transfemoral access and sheath placement was performed using ultrasound guidance.  The left common femoral artery was cannulated using a micropuncture needle and appropriate location was verified using hand injection angiogram.  A pair of Abbott Perclose percutaneous closure devices were placed and a 6 French sheath replaced into the femoral artery.  The patient was heparinized systemically and ACT verified > 250 seconds.    A 14 Fr transfemoral E-sheath was introduced into the left common femoral artery after progressively dilating over an Amplatz superstiff wire. An AL1 catheter was used to direct a straight-tip exchange length wire across the native aortic valve into the left ventricle. This was exchanged out for a pigtail catheter and position was confirmed in the LV apex. Simultaneous LV and Ao pressures were recorded with an LVEDP of .  The pigtail catheter was exchanged for a Safari wire in the LV apex.   BALLOON AORTIC VALVULOPLASTY:   Was not performed  TRANSCATHETER HEART VALVE DEPLOYMENT:   An Edwards Sapien 3 Ultra transcatheter heart valve (size 26 mm) was prepared and crimped per manufacturer's guidelines, and the proper orientation of the valve is confirmed on the [employer] delivery system. The valve was advanced through the introducer sheath using normal technique until in an appropriate position in the abdominal aorta beyond the sheath tip. The balloon was then retracted and using the fine-tuning wheel was centered on the valve. The valve was then advanced  across the aortic arch using appropriate flexion of the catheter. The valve was carefully positioned across the aortic valve  annulus. The Commander catheter was retracted using normal technique. Once final position of the valve has been confirmed by angiographic assessment, the valve is deployed during rapid ventricular pacing to maintain systolic blood pressure < 50 mmHg and pulse pressure < 10 mmHg. The balloon inflation is held for >3 seconds after reaching full deployment volume. Once the balloon has fully deflated the balloon is retracted into the ascending aorta and valve function is assessed using echocardiography. There is felt to be no paravalvular leak and no central aortic insufficiency.  The patient's hemodynamic recovery following valve deployment is good.  The deployment balloon and guidewire are both removed.    PROCEDURE COMPLETION:   The sheath was removed and femoral artery closure performed.  Protamine was administered once femoral arterial repair was complete. The temporary pacemaker, pigtail catheter and femoral sheaths were removed with manual pressure used for venous hemostasis.   The patient tolerated the procedure well and is transported to the cath lab recovery area in stable condition. There were no immediate intraoperative complications. All sponge instrument and needle counts are verified correct at completion of the operation.   No blood products were administered during the operation.  The patient received a total of 60 mL of intravenous contrast during the procedure.   Eugenio Hoes, MD 02/04/2023 6:42 PM

## 2023-02-04 NOTE — CV Procedure (Signed)
Radial arterial line placement.    Emergent consent assumed. The right wrist was prepped and draped in the routine sterile fashion a single lumen radial arterial catheter was placed in the right radial artery using a modified Seldinger technique and u/s guidance. Good blood flow and wave forms. A dressing was placed.     Arvilla Meres, MD  12:35 PM

## 2023-02-05 ENCOUNTER — Encounter (HOSPITAL_COMMUNITY): Payer: Self-pay | Admitting: Internal Medicine

## 2023-02-05 ENCOUNTER — Inpatient Hospital Stay (HOSPITAL_COMMUNITY): Payer: Medicare Other

## 2023-02-05 DIAGNOSIS — Z952 Presence of prosthetic heart valve: Secondary | ICD-10-CM | POA: Diagnosis not present

## 2023-02-05 DIAGNOSIS — R570 Cardiogenic shock: Secondary | ICD-10-CM | POA: Diagnosis not present

## 2023-02-05 DIAGNOSIS — I350 Nonrheumatic aortic (valve) stenosis: Secondary | ICD-10-CM | POA: Diagnosis not present

## 2023-02-05 LAB — POCT ACTIVATED CLOTTING TIME: Activated Clotting Time: 360 s

## 2023-02-05 LAB — BASIC METABOLIC PANEL
Anion gap: 7 (ref 5–15)
Anion gap: 9 (ref 5–15)
BUN: 18 mg/dL (ref 8–23)
BUN: 20 mg/dL (ref 8–23)
CO2: 20 mmol/L — ABNORMAL LOW (ref 22–32)
CO2: 21 mmol/L — ABNORMAL LOW (ref 22–32)
Calcium: 7.5 mg/dL — ABNORMAL LOW (ref 8.9–10.3)
Calcium: 7.4 mg/dL — ABNORMAL LOW (ref 8.9–10.3)
Chloride: 99 mmol/L (ref 98–111)
Chloride: 98 mmol/L (ref 98–111)
Creatinine, Ser: 0.75 mg/dL (ref 0.44–1.00)
Creatinine, Ser: 0.72 mg/dL (ref 0.44–1.00)
GFR, Estimated: >60 mL/min (ref >60)
GFR, Estimated: >60 mL/min (ref >60)
Glucose, Bld: 127 mg/dL — ABNORMAL HIGH (ref 70–99)
Glucose, Bld: 132 mg/dL — ABNORMAL HIGH (ref 70–99)
Potassium: 3.9 mmol/L (ref 3.5–5.1)
Potassium: 3.9 mmol/L (ref 3.5–5.1)
Sodium: 126 mmol/L — ABNORMAL LOW (ref 135–145)
Sodium: 128 mmol/L — ABNORMAL LOW (ref 135–145)

## 2023-02-05 LAB — ECHOCARDIOGRAM COMPLETE
AR max vel: 1.83 cm2
AV Area VTI: 1.75 cm2
AV Area mean vel: 1.81 cm2
AV Mean grad: 13 mmHg
AV Peak grad: 22.3 mmHg
Ao pk vel: 2.36 m/s
Area-P 1/2: 4.44 cm2
Calc EF: 44.1 %
Height: 68 in
S' Lateral: 3.1 cm
Single Plane A2C EF: 42.6 %
Single Plane A4C EF: 43.2 %
Weight: 3707.26 [oz_av]

## 2023-02-05 LAB — COOXEMETRY PANEL
Carboxyhemoglobin: 2 % — ABNORMAL HIGH (ref 0.5–1.5)
Carboxyhemoglobin: 0.8 % (ref 0.5–1.5)
Methemoglobin: <0.7 % (ref 0.0–1.5)
Methemoglobin: 1.3 % (ref 0.0–1.5)
O2 Saturation: 72.7 %
O2 Saturation: 59.6 %
Total hemoglobin: 9.3 g/dL — ABNORMAL LOW (ref 12.0–16.0)
Total hemoglobin: 9.3 g/dL — ABNORMAL LOW (ref 12.0–16.0)

## 2023-02-05 LAB — GLUCOSE, CAPILLARY
Glucose-Capillary: 127 mg/dL — ABNORMAL HIGH (ref 70–99)
Glucose-Capillary: 126 mg/dL — ABNORMAL HIGH (ref 70–99)
Glucose-Capillary: 130 mg/dL — ABNORMAL HIGH (ref 70–99)
Glucose-Capillary: 132 mg/dL — ABNORMAL HIGH (ref 70–99)
Glucose-Capillary: 125 mg/dL — ABNORMAL HIGH (ref 70–99)
Glucose-Capillary: 129 mg/dL — ABNORMAL HIGH (ref 70–99)

## 2023-02-05 LAB — AEROBIC CULTURE W GRAM STAIN (SUPERFICIAL SPECIMEN): Culture: NORMAL

## 2023-02-05 LAB — CBC
HCT: 26.8 % — ABNORMAL LOW (ref 36.0–46.0)
Hemoglobin: 9 g/dL — ABNORMAL LOW (ref 12.0–15.0)
MCH: 31.4 pg (ref 26.0–34.0)
MCHC: 33.6 g/dL (ref 30.0–36.0)
MCV: 93.4 fL (ref 80.0–100.0)
Platelets: 226 10*3/uL (ref 150–400)
RBC: 2.87 MIL/uL — ABNORMAL LOW (ref 3.87–5.11)
RDW: 13.8 % (ref 11.5–15.5)
WBC: 11 10*3/uL — ABNORMAL HIGH (ref 4.0–10.5)
nRBC: 0 % (ref 0.0–0.2)

## 2023-02-05 LAB — LIDOCAINE LEVEL: Lidocaine Lvl: 5.9 ug/mL — ABNORMAL HIGH (ref 1.5–5.0)

## 2023-02-05 LAB — MAGNESIUM: Magnesium: 2.1 mg/dL (ref 1.7–2.4)

## 2023-02-05 LAB — VANCOMYCIN, TROUGH: Vancomycin Tr: 13 ug/mL — ABNORMAL LOW (ref 15–20)

## 2023-02-05 LAB — HEPARIN LEVEL (UNFRACTIONATED): Heparin Unfractionated: <0.1 [IU]/mL — ABNORMAL LOW (ref 0.30–0.70)

## 2023-02-05 MED ORDER — PROSOURCE TF20 ENFIT COMPATIBL EN LIQD
60.0000 mL | Freq: Two times a day (BID) | ENTERAL | Status: DC
  Administered 2023-02-05 – 2023-02-07 (×4): 60 mL
  Filled 2023-02-05 (×4): qty 60

## 2023-02-05 MED ORDER — FUROSEMIDE 10 MG/ML IJ SOLN
80.0000 mg | Freq: Once | INTRAMUSCULAR | Status: AC
  Administered 2023-02-05: 80 mg via INTRAVENOUS
  Filled 2023-02-05: qty 8

## 2023-02-05 MED ORDER — FUROSEMIDE 10 MG/ML IJ SOLN
60.0000 mg | Freq: Once | INTRAMUSCULAR | Status: AC
  Administered 2023-02-05: 60 mg via INTRAVENOUS
  Filled 2023-02-05: qty 6

## 2023-02-05 MED ORDER — POTASSIUM CHLORIDE 20 MEQ PO PACK
60.0000 meq | PACK | Freq: Once | ORAL | Status: AC
  Administered 2023-02-05: 60 meq
  Filled 2023-02-05: qty 3

## 2023-02-05 MED ORDER — FAMOTIDINE 20 MG PO TABS
20.0000 mg | ORAL_TABLET | Freq: Two times a day (BID) | ORAL | Status: DC
  Administered 2023-02-05 – 2023-02-07 (×5): 20 mg
  Filled 2023-02-05 (×5): qty 1

## 2023-02-05 MED ORDER — VITAL AF 1.2 CAL PO LIQD
1000.0000 mL | ORAL | Status: DC
  Administered 2023-02-05 – 2023-02-06 (×2): 1000 mL

## 2023-02-05 MED ORDER — ASPIRIN 81 MG PO CHEW
81.0000 mg | CHEWABLE_TABLET | Freq: Every day | ORAL | Status: DC
  Administered 2023-02-05 – 2023-02-07 (×3): 81 mg
  Filled 2023-02-05 (×3): qty 1

## 2023-02-05 MED ORDER — POLYETHYLENE GLYCOL 3350 17 G PO PACK
17.0000 g | PACK | Freq: Two times a day (BID) | ORAL | Status: DC
  Administered 2023-02-05 – 2023-02-06 (×3): 17 g via ORAL
  Filled 2023-02-05 (×3): qty 1

## 2023-02-05 MED ORDER — BISACODYL 10 MG RE SUPP: 10.0000 mg | Freq: Every day | RECTAL | Status: DC | PRN

## 2023-02-05 MED ORDER — DOCUSATE SODIUM 50 MG/5ML PO LIQD
100.0000 mg | Freq: Two times a day (BID) | ORAL | Status: DC
  Administered 2023-02-05 – 2023-02-07 (×5): 100 mg
  Filled 2023-02-05 (×5): qty 10

## 2023-02-05 MED ORDER — DOCUSATE SODIUM 100 MG PO CAPS
200.0000 mg | ORAL_CAPSULE | Freq: Two times a day (BID) | ORAL | Status: DC
  Filled 2023-02-05: qty 2

## 2023-02-05 MED ORDER — TICAGRELOR 90 MG PO TABS
90.0000 mg | ORAL_TABLET | Freq: Two times a day (BID) | ORAL | Status: DC
  Administered 2023-02-05 – 2023-02-07 (×5): 90 mg
  Filled 2023-02-05 (×5): qty 1

## 2023-02-05 MED ORDER — ALPRAZOLAM 0.5 MG PO TABS
1.0000 mg | ORAL_TABLET | Freq: Four times a day (QID) | ORAL | Status: DC
  Administered 2023-02-05 – 2023-02-07 (×9): 1 mg
  Filled 2023-02-05 (×9): qty 2

## 2023-02-05 MED ORDER — BUSPIRONE HCL 10 MG PO TABS
5.0000 mg | ORAL_TABLET | Freq: Two times a day (BID) | ORAL | Status: DC
  Administered 2023-02-05 – 2023-02-07 (×5): 5 mg
  Filled 2023-02-05 (×5): qty 1

## 2023-02-05 MED FILL — Epinephrine Soln Prefilled Syringe 1 MG/10ML (0.1 MG/ML): INTRAMUSCULAR | Qty: 10 | Status: AC

## 2023-02-05 NOTE — Progress Notes (Signed)
NAME:  Madison West, MRN:  [id number], DOB:  Jun 28, 1955, LOS: 12 ADMISSION DATE:  01/23/2023, CONSULTATION DATE:  02/05/2023  REFERRING MD:  TRH, pokhrel, CHIEF COMPLAINT:  VT arrest   History of Present Illness:  68 yo F w/ pertinent PMH PSVT, HTN, HLD, R carotid artery stenosis s/p endarterectomy 2020, CAD s/p stenting 2021, moderate aortic stenosis presents to [facility] ED on 8/9 w/ SOB.   Patient has been having SOB that has been getting progressively worse. Patient states she has been gaining weight and feels fatigued. Patient also having some anginal chest pain similar to when she needed coronary interventions back in 2021. Patient admitted to [facility] on 8/9 and treated for new onset CHF. Echo showing LVEF 30-35%; regional wall motion abnormalities; moderate aortic stenosis. BNP 462. EKG showing PVC, LAE, LAFB w/ miniaml ST elevation. Patient underwent cardiac cath on 8.7 showing mid LAD 95% lesion w/ LAD stent placement. CT surgery consulted for severe aortic stenosis. Patient has been having episodes of non sustained v tach started on metoprolol.    On 8/11, patient on floors and had vtach w/ no pulse. CPR initiated, given epi, and shocked x2 with ROSC achieved. Patient alert after cpr event but slightly confused. Patient again went into Vtach rhythm and was shocked again. Given amio, mag, calcium. Patient started on amio drip. Patient transferred to [facility] ICU and cardiology consulted. EKG showing ST elevation in anterior leads. Code STEMI. PCCM consulted.  Pertinent  Medical History  PSVT, anxiety/depression,  breast cancer status post lumpectomy, chemo and radiation in 2009, hypertension,  hyperlipidemia,  right carotid artery stenosis status post carotid endarterectomy in 2020, CAD status post stent to LAD 8/7  Significant Hospital Events: Including procedures, antibiotic start and stop dates in addition to other pertinent events   01/31/2023 for left heart catheterization intervention on the  mid LAD. 8/13 overnight patient developed refractory VT shocked multiple times taken back to Cath Lab, intra-aortic balloon pump placed.  Stabilized intolerant of inotropes due to recurrent VT on amiodarone lidocaine and Neo-Synephrine. Was taken for consideration of VAECMO and then the decision was to not cannulate 8/15 plans for TAVR today  8/16 doing better, weaning pressors, remains intubated, IABP in place  Interim History / Subjective:   Intubated, critically ill.  Objective   Blood pressure 108/79, pulse (!) 57, temperature 97.9 F (36.6 C), resp. rate 20, height 5\' 8"  (1.727 m), weight 105.1 kg, SpO2 93%. PAP: (23-39)/(15-30) 31/23 CVP:  [5 mmHg-17 mmHg] 12 mmHg PCWP:  [20 mmHg] 20 mmHg CO:  [4.2 L/min-4.7 L/min] 4.7 L/min CI:  [2.2 L/min/m2-2.43 L/min/m2] 2.4 L/min/m2  Vent Mode: PRVC FiO2 (%):  [40 %-60 %] 50 % Set Rate:  [20 bmp-26 bmp] 20 bmp Vt Set:  [560 mL] 560 mL PEEP:  [5 cmH20] 5 cmH20 Pressure Support:  [12 cmH20] 12 cmH20 Plateau Pressure:  [17 cmH20-19 cmH20] 17 cmH20   Intake/Output Summary (Last 24 hours) at 02/05/2023 0947 Last data filed at 02/05/2023 7829 Gross per 24 hour  Intake 3724.35 ml  Output 2710 ml  Net 1014.35 ml   Filed Weights   02/03/23 0630 02/04/23 0500 02/05/23 0124  Weight: 103.3 kg 104.6 kg 105.1 kg    Examination: Gen: general, elderly fm, intubated on life support  ENT NCAT, tracking appropriately  Neck: ETT in place, right swam internal jugular  Lungs: BL vented breaths  Cardiovascular: RRR s1 s2 , can hear the iabp click  Abdomen: soft nt nd  Musculoskeletal: no  edema  Neuro: alert following commands   Presenting: EKG shows ST elevations V1-V3 Rhythm on monitor initially showed runs of VT but then settled down into sinus tachycardia, bigeminy rhythm  Labs show mild leukocytosis, stable hemoglobin  Resolved Hospital Problem list     Assessment & Plan:   Refractory VT, cardiogenic shock VT arrest in the setting of  recent LAD stent, EKG showing anterior wall STEMI, likely in-stent restenosis Acute coronary syndrome, taken to the Cath Lab with successful percutaneous intervention on the mid LAD Plan: Tolerated TAVR yesterday  Plans for diuresis today  ?possible removal of IABP I would like to make her as clinically normal as we can before extubating, dry and pump out to make her less nervous. This will help ensure she doesn't get reintubated   Fevers Plan: Continue broad spectrum ABX   Chest wall pain, s/p CPR  P: On continuous fentanyl  Acute systolic heart failure -EF 30-35% with akinetic apex and septum on echocardiogram   Severe AS - s/p TAVR   Acute hypoxic respiratory failure, resolved   Hypertension -holding losartan and amlodipine  Severe anxiety -on BuSpar and Xanax -Currently on Versed  Hyperglycemia  P: Cbgs with ssi    Best Practice (right click and "Reselect all SmartList Selections" daily)   Diet/type: NPO DVT prophylaxis: systemic heparin GI prophylaxis: N/A Lines: N/A Foley:  N/A Code Status:  full code Last date of multidisciplinary goals of care discussion: continue to update daughters   Labs   CBC: Recent Labs  Lab 02/02/23 0603 02/02/23 0626 02/02/23 1725 02/02/23 1730 02/03/23 0025 02/03/23 0749 02/04/23 0207 02/05/23 0417  WBC 17.2*  --  14.6*  --  13.2*  --  8.8 11.0*  HGB 11.5*   < > 11.4* 11.6* 11.3* 10.5* 9.9* 9.0*  HCT 33.6*   < > 34.4* 34.0* 32.6* 31.0* 28.6* 26.8*  MCV 93.1  --  93.2  --  94.5  --  92.6 93.4  PLT 526*  --  378  --  356  --  270 226   < > = values in this interval not displayed.    Basic Metabolic Panel: Recent Labs  Lab 02/02/23 0603 02/02/23 0626 02/03/23 0025 02/03/23 0749 02/03/23 1425 02/03/23 1603 02/04/23 0207 02/04/23 1940 02/05/23 0417  NA 123*   < > 125* 123*  --  126* 125* 125* 126*  K 4.0   < > 3.9 4.2  --  3.8 3.5 4.0 3.9  CL 94*   < > 93*  --   --  96* 97* 98 99  CO2 22   < > 21*  --   --   20* 20* 19* 20*  GLUCOSE 147*   < > 144*  --   --  151* 136* 141* 127*  BUN 14   < > 15  --   --  12 14 17 18   CREATININE 0.73   < > 0.83  --   --  0.88 0.74 0.65 0.75  CALCIUM 8.7*   < > 7.9*  --   --  7.6* 7.5* 7.2* 7.5*  MG 2.7*   < > 1.8  --  2.1  --  2.0 2.3 2.1  PHOS 3.7  --   --   --  2.4*  --  2.3* 2.9  --    < > = values in this interval not displayed.   GFR: Estimated Creatinine Clearance: 84.2 mL/min (by C-G formula based on SCr of 0.75 mg/dL).  Recent Labs  Lab 02/02/23 0559 02/02/23 0603 02/02/23 0818 02/02/23 1725 02/02/23 1732 02/03/23 0025 02/03/23 4098 02/03/23 0755 02/04/23 0207 02/05/23 0417  PROCALCITON  --   --   --   --   --   --  0.24  --   --   --   WBC  --    < >  --  14.6*  --  13.2*  --   --  8.8 11.0*  LATICACIDVEN 1.4  --  0.8  --  0.5  --   --  0.4*  --   --    < > = values in this interval not displayed.    Liver Function Tests: Recent Labs  Lab 02/02/23 0252  AST 180*  ALT 90*  ALKPHOS 39  BILITOT 0.6  PROT 5.4*  ALBUMIN 3.1*   No results for input(s): "LIPASE", "AMYLASE" in the last 168 hours. No results for input(s): "AMMONIA" in the last 168 hours.  ABG    Component Value Date/Time   PHART 7.295 (L) 02/03/2023 0749   PCO2ART 47.1 02/03/2023 0749   PO2ART 112 (H) 02/03/2023 0749   HCO3 22.6 02/03/2023 0749   TCO2 24 02/03/2023 0749   ACIDBASEDEF 4.0 (H) 02/03/2023 0749   O2SAT 72.7 02/05/2023 0430     Coagulation Profile: No results for input(s): "INR", "PROTIME" in the last 168 hours.  Cardiac Enzymes: No results for input(s): "CKTOTAL", "CKMB", "CKMBINDEX", "TROPONINI" in the last 168 hours.  HbA1C: Hgb A1c MFr Bld  Date/Time Value Ref Range Status  02/01/2023 05:29 AM 5.6 4.8 - 5.6 % Final    Comment:    (NOTE)         Prediabetes: 5.7 - 6.4         Diabetes: >6.4         Glycemic control for adults with diabetes: <7.0   10/15/2017 04:24 PM 5.8 (H) 4.8 - 5.6 % Final    Comment:             Prediabetes: 5.7 -  6.4          Diabetes: >6.4          Glycemic control for adults with diabetes: <7.0     CBG: Recent Labs  Lab 02/04/23 1126 02/04/23 1945 02/04/23 2313 02/05/23 0258 02/05/23 0755  GLUCAP 133* 145* 127* 127* 126*   This patient is critically ill with multiple organ system failure; which, requires frequent high complexity decision making, assessment, support, evaluation, and titration of therapies. This was completed through the application of advanced monitoring technologies and extensive interpretation of multiple databases. During this encounter critical care time was devoted to patient care services described in this note for 33 minutes.  Josephine Igo, DO [facility] 02/05/2023 9:47 AM

## 2023-02-05 NOTE — Progress Notes (Addendum)
Advanced Heart Failure Rounding Note  PCP-Cardiologist: None   Subjective:    8/15 s/p TAVR. Coronary angiogram w/ widely patent stent. Post op echo w/ no paravalvular leak.  No acute events overnight. Remains intubated. Awake on vent and follows commands. No further VT. Renal fx stable.   Remains on IABP at 1:1. On milrinone 0.25 + 6 NE. CO-OX 73%. MAP 74   SWAN #s CVP 12 PA 31/24 (27) CO 4.20 CI 2.20 SVR 1181  Papi 0.58   Remains on lido 1 + amio gtt at 60   Fever resolved. WBC 11 K. Remains on Vancomycin + Cefepime. Cultures pending.     Objective:   Weight Range: 105.1 kg Body mass index is 35.23 kg/m.   Vital Signs:   Temp:  [95.9 F (35.5 C)-99.9 F (37.7 C)] 97.7 F (36.5 C) (08/16 0700) Pulse Rate:  [60-189] 65 (08/16 0700) Resp:  [0-23] 20 (08/16 0700) BP: (94-113)/(62-84) 108/79 (08/15 1600) SpO2:  [57 %-99 %] 96 % (08/16 0700) Arterial Line BP: (84-288)/(29-54) 113/44 (08/16 0700) FiO2 (%):  [60 %] 60 % (08/16 0400) Weight:  [105.1 kg] 105.1 kg (08/16 0124) Last BM Date : 02/01/23  Weight change: Filed Weights   02/03/23 0630 02/04/23 0500 02/05/23 0124  Weight: 103.3 kg 104.6 kg 105.1 kg    Intake/Output:   Intake/Output Summary (Last 24 hours) at 02/05/2023 0714 Last data filed at 02/05/2023 0700 Gross per 24 hour  Intake 3916.35 ml  Output 2540 ml  Net 1376.35 ml      Physical Exam    CVP 12  General:  intubated, awake on vent. Will follow commands  HEENT: normal + ETT  Neck: supple. JVD not well visualized, + Rt internal jugular Swan. Carotids 2+ bilat; no bruits. No lymphadenopathy or thyromegaly appreciated. Cor: PMI nondisplaced. Regular rate & rhythm. No rubs, gallops or murmurs. + Zoll pads Lungs: course anteriorly R>L  Abdomen: soft, nontender, nondistended. No hepatosplenomegaly. No bruits or masses. Good bowel sounds. Extremities: no cyanosis, clubbing, rash, 1+ b/l LEE, rt fem IABP site stable  Neuro: intubated, awake  on vent and following commands   Telemetry   SB/NSR upper 50s-low 60s. No further VT   Labs    CBC Recent Labs    02/04/23 0207 02/05/23 0417  WBC 8.8 11.0*  HGB 9.9* 9.0*  HCT 28.6* 26.8*  MCV 92.6 93.4  PLT 270 226   Basic Metabolic Panel Recent Labs    16/10/96 0207 02/04/23 1940 02/05/23 0417  NA 125* 125* 126*  K 3.5 4.0 3.9  CL 97* 98 99  CO2 20* 19* 20*  GLUCOSE 136* 141* 127*  BUN 14 17 18   CREATININE 0.74 0.65 0.75  CALCIUM 7.5* 7.2* 7.5*  MG 2.0 2.3 2.1  PHOS 2.3* 2.9  --    Liver Function Tests No results for input(s): "AST", "ALT", "ALKPHOS", "BILITOT", "PROT", "ALBUMIN" in the last 72 hours.  No results for input(s): "LIPASE", "AMYLASE" in the last 72 hours. Cardiac Enzymes No results for input(s): "CKTOTAL", "CKMB", "CKMBINDEX", "TROPONINI" in the last 72 hours.  BNP: BNP (last 3 results) Recent Labs    01/23/23 2208  BNP 462.3*    ProBNP (last 3 results) No results for input(s): "PROBNP" in the last 8760 hours.   D-Dimer No results for input(s): "DDIMER" in the last 72 hours. Hemoglobin A1C No results for input(s): "HGBA1C" in the last 72 hours.  Fasting Lipid Panel Recent Labs    02/04/23 0207  TRIG 91   Thyroid Function Tests No results for input(s): "TSH", "T4TOTAL", "T3FREE", "THYROIDAB" in the last 72 hours.  Invalid input(s): "FREET3"  Other results:   Imaging    EP STUDY  Result Date: 02/04/2023 See surgical note for result.  Structural Heart Procedure  Result Date: 02/04/2023 See surgical note for result.  ECHO TEE  Result Date: 02/04/2023    TRANSESOPHOGEAL ECHO REPORT   Patient Name:   LYMARI AZUMA [facility] Date of Exam: 02/04/2023 Medical Rec #:  [id number]           Height:       68.0 in Accession #:    [id number]          Weight:       230.6 lb Date of Birth:  [birth year]           BSA:          2.171 m Patient Age:    67 years            BP:           108/78 mmHg Patient Gender: F                   HR:            63 bpm. Exam Location:  Inpatient Procedure: Limited Echo, Color Doppler and Cardiac Doppler Indications:     Aortic Stenosis i35.0  History:         Patient has prior history of Echocardiogram examinations, most                  recent 02/01/2023. CHF; Risk Factors:Hypertension and                  Dyslipidemia.  Sonographer:     Irving Burton Senior RDCS Referring Phys:  [id number] Janetta Hora Diagnosing Phys: Thurmon Fair MD PROCEDURE: After discussion of the risks and benefits of a TEE, an informed consent was obtained from the patient. The transesophogeal probe was passed without difficulty through the esophogus of the patient. Sedation performed by different physician. The patient was monitored while under deep sedation. Supplementary images were obtained from transthoracic windows as indicated to answer the clinical question. The patient developed no complications during the procedure.  PRE-PROCEDURE FINDINGS Severely decreased left ventricular systolic function. Severe anteroapical hypokinesis. Estimated LVEF 25% Trileaflet aortic valve with moderate-severe calcific stenosis. Aortic valve peak gradient 46 mm Hg, mean gradient 29 mm Hg, dimensionless index 0.30, calculated valve area 1.03 cm sq ( 0.4 cm sq/m sq BSA). Trivial aortic insufficiency. Trivial mitral insufficiency. No pericardial effusion. POST-PROCEDURE FINDINGS Improved, moderately decreased left ventricular systolic function. The wall motion abnormality pattern is similar, but there appears to be improved contractility in the anteroapical wall compared to baseline. Estimated LVEF 35% Well deployed stent valve (TAVR). Aortic valve peak gradient 13 mm Hg, mean gradient 6 mm Hg, dimensionless index 0.77, calculated valve area 2.43 cm sq (1.12 cm sq/m sq BSA), acceleration time 91 ms. Trivial perivalvular leak anteriorly ("12 o'clock"). Trivial mitral insufficiency. No pericardial effusion.  IMPRESSIONS  1. Left ventricular ejection fraction, by  estimation, is 25-35%. The left ventricle has severely decreased function. The left ventricle demonstrates regional wall motion abnormalities (see scoring diagram/findings for description). There is mild concentric left ventricular hypertrophy.  2. Right ventricular systolic function is normal. The right ventricular size is normal.  3. No left atrial/left atrial appendage thrombus was detected.  4. The mitral valve is normal  in structure. Trivial mitral valve regurgitation.  5. The aortic valve has been repaired/replaced. Aortic valve regurgitation is trivial. Procedure Date: 02/04/2023. Aortic valve area, by VTI measures 2.43 cm. Aortic valve Vmax measures 1.79 m/s.  6. Counterpulsation balloon pump in the descending aorta with tip ath the junction with the aortic arch. There is mild (Grade II) plaque.  7. Evidence of atrial level shunting detected by color flow Doppler. There is a small patent foramen ovale with predominantly left to right shunting across the atrial septum. FINDINGS  Left Ventricle: Left ventricular ejection fraction, by estimation, is 25-35%. The left ventricle has severely decreased function. The left ventricle demonstrates regional wall motion abnormalities. The left ventricular internal cavity size was normal in  size. There is mild concentric left ventricular hypertrophy.  LV Wall Scoring: The entire anterior wall, mid and distal lateral wall, mid and distal anterior septum, and entire apex are hypokinetic. The antero-lateral wall, inferior wall, basal anteroseptal segment, basal inferolateral segment, mid inferoseptal segment, and basal inferoseptal segment are normal. Right Ventricle: The right ventricular size is normal. No increase in right ventricular wall thickness. Right ventricular systolic function is normal. Left Atrium: Left atrial size was normal in size. No left atrial/left atrial appendage thrombus was detected. Right Atrium: Right atrial size was normal in size. Pericardium:  Trivial pericardial effusion is present. Mitral Valve: The mitral valve is normal in structure. Trivial mitral valve regurgitation. Tricuspid Valve: The tricuspid valve is normal in structure. Tricuspid valve regurgitation is trivial. Aortic Valve: The aortic valve has been repaired/replaced. Aortic valve regurgitation is trivial. Aortic regurgitation PHT measures 515 msec. Aortic valve mean gradient measures 6.0 mmHg. Aortic valve peak gradient measures 12.8 mmHg. Aortic valve area, by VTI measures 2.43 cm. There is a 26 mm Sapien prosthetic, stented (TAVR) valve present in the aortic position. Pulmonic Valve: The pulmonic valve was grossly normal. Pulmonic valve regurgitation is mild. Aorta: Counterpulsation balloon pump in the descending aorta with tip ath the junction with the aortic arch. The aortic root, ascending aorta and aortic arch are all structurally normal, with no evidence of dilitation or obstruction. There is mild (Grade  II) plaque. IAS/Shunts: Evidence of atrial level shunting detected by color flow Doppler. A small patent foramen ovale is detected with predominantly left to right shunting across the atrial septum. Additional Comments: A venous catheter is visualized in the right ventricle. Spectral Doppler performed. LEFT VENTRICLE PLAX 2D LVOT diam:     2.00 cm LV SV:         71 LV SV Index:   33 LVOT Area:     3.14 cm  AORTIC VALVE AV Area (Vmax):    2.18 cm AV Area (Vmean):   2.57 cm AV Area (VTI):     2.43 cm AV Vmax:           179.00 cm/s AV Vmean:          113.000 cm/s AV VTI:            0.292 m AV Peak Grad:      12.8 mmHg AV Mean Grad:      6.0 mmHg LVOT Vmax:         124.00 cm/s LVOT Vmean:        92.300 cm/s LVOT VTI:          0.226 m LVOT/AV VTI ratio: 0.77 AI PHT:            515 msec  SHUNTS Systemic VTI:  0.23 m  Systemic Diam: 2.00 cm Rachelle Hora Croitoru MD Electronically signed by Thurmon Fair MD Signature Date/Time: 02/04/2023/6:09:39 PM    Final      Medications:      Scheduled Medications:  ALPRAZolam  1 mg Oral Q6H   aspirin  81 mg Oral Daily   busPIRone  5 mg Oral BID   Chlorhexidine Gluconate Cloth  6 each Topical Daily   famotidine  20 mg Oral BID   feeding supplement (PROSource TF20)  60 mL Per Tube QID   fentaNYL (SUBLIMAZE) injection  50 mcg Intravenous Once   insulin aspart  2-6 Units Subcutaneous Q4H   lidocaine  1 patch Transdermal Q24H   mupirocin ointment  1 Application Nasal BID   mouth rinse  15 mL Mouth Rinse Q2H   rosuvastatin  20 mg Oral Daily   sodium chloride flush  3 mL Intravenous Q12H   sodium chloride flush  3 mL Intravenous Q12H   ticagrelor  90 mg Oral BID    Infusions:  sodium chloride     sodium chloride Stopped (02/04/23 1406)   sodium chloride     sodium chloride     sodium chloride     sodium chloride     albumin human Stopped (02/03/23 1032)   amiodarone 60 mg/hr (02/05/23 0700)   ceFEPime (MAXIPIME) IV Stopped (02/05/23 0009)   dexmedetomidine (PRECEDEX) IV infusion 0.4 mcg/kg/hr (02/05/23 0700)   feeding supplement (VITAL AF 1.2 CAL) Stopped (02/04/23 0000)   fentaNYL infusion INTRAVENOUS 175 mcg/hr (02/05/23 0700)   heparin 800 Units/hr (02/05/23 0700)   lidocaine 1 mg/min (02/05/23 0700)   midazolam 3 mg/hr (02/05/23 0700)   milrinone 0.25 mcg/kg/min (02/05/23 0700)   nitroGLYCERIN     norepinephrine (LEVOPHED) Adult infusion 6 mcg/min (02/05/23 0700)   propofol (DIPRIVAN) infusion Stopped (02/03/23 0846)   sodium chloride     vancomycin Stopped (02/04/23 2225)    PRN Medications: sodium chloride, sodium chloride, sodium chloride, Place/Maintain arterial line **AND** sodium chloride, sodium chloride, acetaminophen, albumin human, alum & mag hydroxide-simeth, calcium carbonate, fentaNYL, lidocaine, midazolam, morphine injection, nitroGLYCERIN, ondansetron (ZOFRAN) IV, oxyCODONE, sodium chloride flush, sodium chloride flush    Patient Profile    67 y.o. female with history of CAD s/p PCI to  RCA in 2021, carotid artery stenosis s/p R CEA, aortic valve stenosis. Admitted with unstable angina. Found to have severe LAD stenosis s/p PCI and severe low flow low gradient AS. Coarse c/b refractory VT and hemodynamic instability >> cardiogenic shock.    Assessment/Plan   Acute systolic CHF >> cardiogenic shock -In setting of severe ischemic heart disease, reduced EF, severe AS and refractory VT/VF -Echo 01/25/23: EF 30-35%, septum and apex HK, RV low normal, low flow low graident severe AS with mean gradient 20 mmHg, AVA 0.93 cm2, DI 0.33 -echo 02/01/23: EF 20-25%, RV okay, severe low flow low gradient severe AS with mean gradient 25 mmHg, AVA 1.1 cm2 -s/p PCI/stent to LAD. Stent thrombosis on 08/09 treated with thrombectomy.  -IABP placed 08/13. Had considered [facility] ECMO but hemodynamics improved with inotrope support -S/p TAVR 8/15 -IABP at 1:1. Continues on 0.25 milrinone + 6 NE. CI 2.2. Co-ox 73%  No VT overnight. -CVP 12. Getting a lot of volume with gtts. Give 60 mg lasix bid   2. Refractory VT -S/p multiple shocks.  -Now on lidocaine and amiodarone gtts -No recurrent VT -? Able to stop lidocaine today -K 3.9 and Mag 2.1. Supp w/ diuresis    3. CAD NSTEMI -  HX PCI to RCA in 2021 -Presented with unstable angina. S/p PCI/stent to LAD on 08/07. Stent thrombosis 08/09 treated with thrombectomy -HS troponin > 24K on 08/12 -LHC 08/12 with patent LAD stent -LAD sent widely patent on TAVR angiogram -Continue DAPT. Had been declining statin.   4. LFLG Severe AS -Noted on echo this admit -Not candidate for SAVR -s/p TAVR 8/15  5. Carotid artery stenosis -S/p R CEA in 2020  6. ID -Developed sepsis 08/13. Now on cefepime + vancomycin -Fever resolved.  -BC X 2 pending. Nasal culture rare gram + cocci and rare gram + rods. Resp culture rare gram + cocci in pairs.  7. Respiratory failure -Vent management per CCM  8. Hyponatremia -Hypervolemic -Na 126 -diuresis per above     Length of Stay: [address], PA-C  02/05/2023, 7:14 AM  Advanced Heart Failure Team Pager [phone] (M-F; 7a - 5p)  Please contact [facility] for night-coverage after hours (5p -7a ) and weekends on [email or web address]  Agree with above  Remains intubated and sedated. Will awaken and follow commands but gets very agitated.  On milrinone 0.25 NE 6 and IABP 1:1  Echo at bedside EF 40-45% (up from 25%)  TAVR valve working well.   Swan numbers with marginal CI 2.1 and volume overload   General:  Intubated sedated HEENT: normal + ETT Neck: supple. +RIJ swan Cor: PMI nondisplaced. Regular rate & rhythm. No rubs, gallops or murmurs. Lungs: clear Abdomen: obese soft, nontender, nondistended. No hepatosplenomegaly. No bruits or masses. Good bowel sounds. Extremities: no cyanosis, clubbing, rash, 2+ edema RFA IABP Neuro: intubated/sedated  Remains volume overloaded. EF improved but hemodynamics still marginal.  I d/w CCM at bedside. Will plan to diurese aggressively today and hopefully get IABP later today in preparation for extubation tomorrow (I doubt she will tolerate IABP if not sedated given extreme anxiety).   CRITICAL CARE Performed by: Arvilla Meres  Total critical care time: 45 minutes  Critical care time was exclusive of separately billable procedures and treating other patients.  Critical care was necessary to treat or prevent imminent or life-threatening deterioration.  Critical care was time spent personally by me (independent of midlevel providers or residents) on the following activities: development of treatment plan with patient and/or surrogate as well as nursing, discussions with consultants, evaluation of patient's response to treatment, examination of patient, obtaining history from patient or surrogate, ordering and performing treatments and interventions, ordering and review of laboratory studies, ordering and review of radiographic studies, pulse  oximetry and re-evaluation of patient's condition.  Arvilla Meres, MD  10:36 PM

## 2023-02-05 NOTE — Progress Notes (Signed)
Echocardiogram 2D Echocardiogram has been performed.  Warren Lacy Hana Trippett RDCS 02/05/2023, 9:46 AM

## 2023-02-05 NOTE — Progress Notes (Signed)
eLink Physician-Brief Progress Note Patient Name: Madison West DOB: [birth year] MRN: [id number]   Date of Service  02/05/2023  HPI/Events of Note  Lidocaine level 5.9 from specimen yesterday morning, updated result is pending, patient with Lidocaine gtt currently running at 1.5 mg / min.  eICU Interventions  Will reduce Lidocaine gtt rate to 1 mg / min while awaiting result from most recent level drawn.        Thomasene Lot Stephen Baruch 02/05/2023, 2:21 AM

## 2023-02-05 NOTE — Progress Notes (Signed)
Pt failed SBT 12/5 40% due to <90% SATs and increased RR >40.

## 2023-02-05 NOTE — Progress Notes (Signed)
Initial Nutrition Assessment  DOCUMENTATION CODES:   Not applicable  INTERVENTION:   While IABP in place, reversed trendelenburg of >10 degrees at all times while TF infusing. HOB >30 degrees once able   Tube Feeding via OG:  Vital AF 1.2 at 55 ml/hr Goal TF Vital AF 1.2 at 55 ml/hr with Pro-Source TF20 60 mL BID TF at goal provides 119 g of protein, 1664 kcals and 1069 mL of free water  Last BM 8/12 (small) prior to surgery. No bowel regimen ordered; recommend adding scheduled bowel regimen for now  NUTRITION DIAGNOSIS:   Inadequate oral intake related to acute illness as evidenced by NPO status.  Being addressed via TF   GOAL:   Patient will meet greater than or equal to 90% of their needs  Met via TF  MONITOR:   TF tolerance, Vent status, Labs, Weight trends  REASON FOR ASSESSMENT:   Ventilator    ASSESSMENT:   67 yo female admitted progressively worsening SOB and fatigue with weight gain and admitted with acute systolic failure. Pt with CODE STEMI post Vtach arrest, developed refractory VT requiring IABP, intubation. PMH includes HTN, HLD, CAD s/p stenting, moderate aprtic stenosis  8/04 Admitted 8/11 V-tach arrest, CPR initiated, Transferred to ICU, Code STEMI 8/13 Developed refractory VT, shocked multiple times, IABP. Considered for [facility] ECMO but decision made not to cannulate 8/14 trickle TF initiated 8/15 TAVR 8/16 Trickle TF initiated and titration towards goal  Pt remains on vent support; did not tolerate weaning Levophed at 5, sedated  TF restarted this AM, tolerating trickle TF ov Vital AF 1.2 at 20 ml/hr  Last BM 8/12, no scheduled bowel regimen, pt has required significant sedation while on vent  Labs: phosphorus 2.3 (L), sodium 125 (L), Creatinine wdl Meds: ss novolog    Diet Order:   Diet Order             Diet NPO time specified  Diet effective now                   EDUCATION NEEDS:   Not appropriate for education at this  time  Skin:  Skin Assessment: Reviewed RN Assessment  Last BM:  8/12  Height:   Ht Readings from Last 1 Encounters:  01/24/23 5\' 8"  (1.727 m)    Weight:   Wt Readings from Last 1 Encounters:  02/05/23 105.1 kg     BMI:  Body mass index is 35.23 kg/m.  Estimated Nutritional Needs:   Kcal:  1550 via IC on 02/03/23  Protein:  100-120 g  Fluid:  >/= 1.5 L   Romelle Starcher MS, RDN, LDN, CNSC Registered Dietitian 3 Clinical Nutrition RD Pager and On-Call Pager Number Located in [place]

## 2023-02-05 NOTE — Progress Notes (Addendum)
PT Cancellation Note  Patient Details Name: Madison West MRN: [id number] DOB: 10-May-1956   Cancelled Treatment:    Reason Eval/Treat Not Completed: Medical issues which prohibited therapy Pt remains intubated and sedated.  Will hold PT today, confirmed with RN. Anise Salvo, PT Acute Rehab [facility] [phone]   Rayetta Humphrey 02/05/2023, 10:15 AM

## 2023-02-05 NOTE — Progress Notes (Signed)
Patient TAVR results evaluated and reviewed.  Patient presently on appropriate medical therapy, has been doing well still significantly volume overloaded and remains intubated.  She is alert and oriented and responds appropriately.  I called and discussed with her daughter Toni Amend and give her positive results.   Yates Decamp, MD, [facility] 02/05/2023, 2:44 PM Office: [phone] Fax: [phone] Pager: [phone]

## 2023-02-05 NOTE — Progress Notes (Signed)
ANTICOAGULATION CONSULT NOTE -  Consult  Pharmacy Consult for Heparin Indication: IABP  Allergies  Allergen Reactions   Prednisone Palpitations   Fentanyl Swelling and Other (See Comments)    Makes pt "hyper" feels she is "climbing the walls."  Last time she had a colonoscopy with Fentanyl and Versed she was awake the whole time.   Versed [Midazolam] Swelling and Anxiety    Makes pt "hyper" feels she is "climbing the walls."  Last time she had a colonoscopy with Fentanyl and Versed she was awake the whole time.   Vicodin [Hydrocodone-Acetaminophen] Anxiety    Extreme anxiety.  OK to take oxycodone.    Patient Measurements: Height: 5\' 8"  (172.7 cm) Weight: 105.1 kg (231 lb 11.3 oz) IBW/kg (Calculated) : 63.9 Heparin Dosing Weight: 85 kg  Vital Signs: Temp: 97.3 F (36.3 C) (08/16 1900) Temp Source: Oral (08/16 1200) Pulse Rate: 54 (08/16 1900)  Labs: Recent Labs    02/03/23 0025 02/03/23 0738 02/03/23 0749 02/03/23 1603 02/04/23 0207 02/04/23 0400 02/04/23 1940 02/05/23 0417  HGB 11.3*  --  10.5*  --  9.9*  --   --  9.0*  HCT 32.6*  --  31.0*  --  28.6*  --   --  26.8*  PLT 356  --   --   --  270  --   --  226  HEPARINUNFRC 0.15* 0.22*  --   --   --  <0.10*  --  <0.10*  CREATININE 0.83  --   --    < > 0.74  --  0.65 0.75   < > = values in this interval not displayed.    Estimated Creatinine Clearance: 84.2 mL/min (by C-G formula based on SCr of 0.75 mg/dL).   Medical History: Past Medical History:  Diagnosis Date   Anxiety    Arthritis    low back and hip pain intermittent   Breast cancer (HCC) 06/02/07   r breast -surgery ,radiaology. chemotherapy   Carpal tunnel syndrome    right hand   Colon polyps    Complication of anesthesia    Fentanyl, Versed-makes extra hyper, bradycardia x 1 in PACU, [facility] (08/15/11 cardiology felt neostigmine may have resulted in AV nodal block)    Coronary artery disease    Depression    denies   Dysplasia of vulva     Hypertension    Palpitations    PSVT, s/p adenosine 08/04/16   S/P breast lumpectomy 07/04/07   R breast   S/P radiation therapy 2009    Medications:  Scheduled:   ALPRAZolam  1 mg Per Tube Q6H   aspirin  81 mg Per Tube Daily   busPIRone  5 mg Per Tube BID   Chlorhexidine Gluconate Cloth  6 each Topical Daily   docusate  100 mg Per Tube BID   famotidine  20 mg Per Tube BID   feeding supplement (PROSource TF20)  60 mL Per Tube BID   fentaNYL (SUBLIMAZE) injection  50 mcg Intravenous Once   insulin aspart  2-6 Units Subcutaneous Q4H   lidocaine  1 patch Transdermal Q24H   mupirocin ointment  1 Application Nasal BID   mouth rinse  15 mL Mouth Rinse Q2H   polyethylene glycol  17 g Oral BID   rosuvastatin  20 mg Oral Daily   sodium chloride flush  3 mL Intravenous Q12H   sodium chloride flush  3 mL Intravenous Q12H   ticagrelor  90 mg Per Tube BID  Assessment: 67 y.o. female with CHF s/p PCI, now with recurrent VT/VF s/p IABP placement for heparin  S/p TAVR IABP remains in place  - possible removal today  Heparin drip 800u/h with heparin level < 0.1 as expected <  goal - no bleeding noted CBC stable  Will continue current rate as IABP may be removed today  PM update:  IABP to remain in overnight, per Dr. Gala Romney okay to increase patient to full anticoagulation. Patient previously therapeutic on 1100u/hr of heparin.   Goal of Therapy:  Heparin level 0.2-0.5 units/mL Monitor platelets by anticoagulation protocol: Yes   Plan:  Increase heparin to 1100u/hr.  Check 6 hour heparin level.  Monitor h/h bleeding   Estill Batten, PharmD, BCCCP  Clinical Pharmacist [phone] 02/05/2023 7:07 PM

## 2023-02-05 NOTE — Progress Notes (Signed)
Pharmacy Antibiotic Note  Madison West is a 67 y.o. female admitted on 01/23/2023 s/p IABP/DES/planning TAVR with new fever and wbc 13 PCT0.25 and green mucus drainage from resp tract  started on vancomycin and cefepime.  Now afebrile, wbc wnl BCx ngtd, sputum cx enterobacter - pan sensitive. Cr0.8 crcl appr41ml/min   Plan: Vancomcin 1gm IV q12h continue 2 more days until IABP removed VT 13 at goal Cefepime 2gm IV q8h x7 days  Height: 5\' 8"  (172.7 cm) Weight: 105.1 kg (231 lb 11.3 oz) IBW/kg (Calculated) : 63.9  Temp (24hrs), Avg:97.5 F (36.4 C), Min:95.9 F (35.5 C), Max:99.3 F (37.4 C)  Recent Labs  Lab 02/02/23 0011 02/02/23 0252 02/02/23 0559 02/02/23 0603 02/02/23 0818 02/02/23 1725 02/02/23 1732 02/03/23 0025 02/03/23 0755 02/03/23 1603 02/04/23 0207 02/04/23 1940 02/05/23 0417 02/05/23 0750  WBC  --    < >  --  17.2*  --  14.6*  --  13.2*  --   --  8.8  --  11.0*  --   CREATININE  --    < >  --  0.73  --  0.78  --  0.83  --  0.88 0.74 0.65 0.75  --   LATICACIDVEN 1.5  --  1.4  --  0.8  --  0.5  --  0.4*  --   --   --   --   --   VANCOTROUGH  --   --   --   --   --   --   --   --   --   --   --   --   --  13*   < > = values in this interval not displayed.    Estimated Creatinine Clearance: 84.2 mL/min (by C-G formula based on SCr of 0.75 mg/dL).    Allergies  Allergen Reactions   Prednisone Palpitations   Fentanyl Swelling and Other (See Comments)    Makes pt "hyper" feels she is "climbing the walls."  Last time she had a colonoscopy with Fentanyl and Versed she was awake the whole time.   Versed [Midazolam] Swelling and Anxiety    Makes pt "hyper" feels she is "climbing the walls."  Last time she had a colonoscopy with Fentanyl and Versed she was awake the whole time.   Vicodin [Hydrocodone-Acetaminophen] Anxiety    Extreme anxiety.  OK to take oxycodone.    Antimicrobials this admission:   Dose adjustments this admission:   Microbiology  results: 8/14 BCx: ngtd 8/14 Sputum: enterobacter - pan sensitive  Leota Sauers Pharm.D. CPP, BCPS Clinical Pharmacist [phone] 02/05/2023 3:50 PM

## 2023-02-05 NOTE — Progress Notes (Signed)
ANTICOAGULATION CONSULT NOTE -  Consult  Pharmacy Consult for Heparin Indication: IABP  Allergies  Allergen Reactions   Prednisone Palpitations   Fentanyl Swelling and Other (See Comments)    Makes pt "hyper" feels she is "climbing the walls."  Last time she had a colonoscopy with Fentanyl and Versed she was awake the whole time.   Versed [Midazolam] Swelling and Anxiety    Makes pt "hyper" feels she is "climbing the walls."  Last time she had a colonoscopy with Fentanyl and Versed she was awake the whole time.   Vicodin [Hydrocodone-Acetaminophen] Anxiety    Extreme anxiety.  OK to take oxycodone.    Patient Measurements: Height: 5\' 8"  (172.7 cm) Weight: 105.1 kg (231 lb 11.3 oz) IBW/kg (Calculated) : 63.9 Heparin Dosing Weight: 85 kg  Vital Signs: Temp: 97.9 F (36.6 C) (08/16 1515) Temp Source: Oral (08/16 1200) Pulse Rate: 47 (08/16 1515)  Labs: Recent Labs    02/03/23 0025 02/03/23 0738 02/03/23 0749 02/03/23 1603 02/04/23 0207 02/04/23 0400 02/04/23 1940 02/05/23 0417  HGB 11.3*  --  10.5*  --  9.9*  --   --  9.0*  HCT 32.6*  --  31.0*  --  28.6*  --   --  26.8*  PLT 356  --   --   --  270  --   --  226  HEPARINUNFRC 0.15* 0.22*  --   --   --  <0.10*  --  <0.10*  CREATININE 0.83  --   --    < > 0.74  --  0.65 0.75   < > = values in this interval not displayed.    Estimated Creatinine Clearance: 84.2 mL/min (by C-G formula based on SCr of 0.75 mg/dL).   Medical History: Past Medical History:  Diagnosis Date   Anxiety    Arthritis    low back and hip pain intermittent   Breast cancer (HCC) 06/02/07   r breast -surgery ,radiaology. chemotherapy   Carpal tunnel syndrome    right hand   Colon polyps    Complication of anesthesia    Fentanyl, Versed-makes extra hyper, bradycardia x 1 in PACU, [facility] (08/15/11 cardiology felt neostigmine may have resulted in AV nodal block)    Coronary artery disease    Depression    denies   Dysplasia of vulva     Hypertension    Palpitations    PSVT, s/p adenosine 08/04/16   S/P breast lumpectomy 07/04/07   R breast   S/P radiation therapy 2009    Medications:  Scheduled:   ALPRAZolam  1 mg Per Tube Q6H   aspirin  81 mg Per Tube Daily   busPIRone  5 mg Per Tube BID   Chlorhexidine Gluconate Cloth  6 each Topical Daily   docusate  100 mg Per Tube BID   famotidine  20 mg Per Tube BID   feeding supplement (PROSource TF20)  60 mL Per Tube BID   fentaNYL (SUBLIMAZE) injection  50 mcg Intravenous Once   insulin aspart  2-6 Units Subcutaneous Q4H   lidocaine  1 patch Transdermal Q24H   mupirocin ointment  1 Application Nasal BID   mouth rinse  15 mL Mouth Rinse Q2H   polyethylene glycol  17 g Oral BID   rosuvastatin  20 mg Oral Daily   sodium chloride flush  3 mL Intravenous Q12H   sodium chloride flush  3 mL Intravenous Q12H   ticagrelor  90 mg Per Tube BID  Assessment: 67 y.o. female with CHF s/p PCI, now with recurrent VT/VF s/p IABP placement for heparin  S/p TAVR IABP remains in place  - possible removal today  Heparin drip 800u/h with heparin level < 0.1 as expected <  goal - no bleeding noted CBC stable  Will continue current rate as IABP may be removed today  Goal of Therapy:  Heparin level 0.2-0.5 units/mL Monitor platelets by anticoagulation protocol: Yes   Plan:  Continue heparin drip 800 uts/hr  Daily heparin level and cbc  Monitor h/h bleeding    Leota Sauers Pharm.D. CPP, BCPS Clinical Pharmacist [phone] 02/05/2023 3:53 PM

## 2023-02-05 NOTE — Progress Notes (Addendum)
[facility]   MULTIDISCIPLINARY HEART VALVE TEAM  Patient Name: Madison West Date of Encounter: 02/05/2023  Admit date: 01/23/2023  Primary Care Provider: [name], MD [facility] Cardiologist: Dr. Jacinto Halim / Patwatdhan   [facility] Electrophysiologist:  None   Hospital Problem List     Principal Problem:   Cardiogenic shock [facility]) Active Problems:   Hypertension   Anxiety   Unstable angina (HCC)   Coronary artery disease involving native coronary artery of native heart with unstable angina pectoris (HCC)   Pure hypercholesterolemia   CHF (congestive heart failure) (HCC)   Atherosclerotic heart disease   Acute HFrEF (heart failure with reduced ejection fraction) (HCC)   Nonrheumatic aortic valve stenosis   Former smoker   Hypertensive heart disease with acute systolic congestive heart failure (HCC)   Bilateral carotid artery stenosis   HFrEF (heart failure with reduced ejection fraction) (HCC)   ST elevation myocardial infarction involving left anterior descending (LAD) coronary artery (HCC)   Coronary stent thrombosis   Torsades de pointes (HCC)   Ventricular fibrillation (HCC)     Subjective   Intubated and sedated but wakes up and nods head  Inpatient Medications    Scheduled Meds:  ALPRAZolam  1 mg Per Tube Q6H   aspirin  81 mg Per Tube Daily   busPIRone  5 mg Per Tube BID   Chlorhexidine Gluconate Cloth  6 each Topical Daily   famotidine  20 mg Per Tube BID   feeding supplement (PROSource TF20)  60 mL Per Tube QID   fentaNYL (SUBLIMAZE) injection  50 mcg Intravenous Once   insulin aspart  2-6 Units Subcutaneous Q4H   lidocaine  1 patch Transdermal Q24H   mupirocin ointment  1 Application Nasal BID   mouth rinse  15 mL Mouth Rinse Q2H   potassium chloride  60 mEq Per Tube Once   rosuvastatin  20 mg Oral Daily   sodium chloride flush  3 mL Intravenous Q12H   sodium chloride flush  3 mL Intravenous Q12H   ticagrelor  90 mg Per  Tube BID   Continuous Infusions:  sodium chloride     sodium chloride Stopped (02/04/23 1406)   sodium chloride     sodium chloride     sodium chloride     sodium chloride     albumin human Stopped (02/03/23 1032)   amiodarone 60 mg/hr (02/05/23 0700)   ceFEPime (MAXIPIME) IV Stopped (02/05/23 0009)   dexmedetomidine (PRECEDEX) IV infusion 0.4 mcg/kg/hr (02/05/23 0700)   feeding supplement (VITAL AF 1.2 CAL) Stopped (02/04/23 0000)   fentaNYL infusion INTRAVENOUS 175 mcg/hr (02/05/23 0700)   heparin 800 Units/hr (02/05/23 0700)   lidocaine 1 mg/min (02/05/23 0700)   midazolam 3 mg/hr (02/05/23 0700)   milrinone 0.25 mcg/kg/min (02/05/23 0700)   norepinephrine (LEVOPHED) Adult infusion 6 mcg/min (02/05/23 0700)   sodium chloride     vancomycin Stopped (02/04/23 2225)   PRN Meds: sodium chloride, sodium chloride, sodium chloride, Place/Maintain arterial line **AND** sodium chloride, sodium chloride, acetaminophen, albumin human, alum & mag hydroxide-simeth, calcium carbonate, fentaNYL, lidocaine, midazolam, morphine injection, nitroGLYCERIN, ondansetron (ZOFRAN) IV, oxyCODONE, sodium chloride flush, sodium chloride flush   Vital Signs    Vitals:   02/05/23 0630 02/05/23 0645 02/05/23 0700 02/05/23 0805  BP:      Pulse: 65 65 65   Resp:   20   Temp: 97.7 F (36.5 C) 97.7 F (36.5 C) 97.7 F (36.5 C)   TempSrc:  SpO2: 96% 96% 96% 95%  Weight:      Height:        Intake/Output Summary (Last 24 hours) at 02/05/2023 0808 Last data filed at 02/05/2023 0700 Gross per 24 hour  Intake 3706.64 ml  Output 2450 ml  Net 1256.64 ml   Filed Weights   02/03/23 0630 02/04/23 0500 02/05/23 0124  Weight: 103.3 kg 104.6 kg 105.1 kg    Physical Exam    General: intubated and sedated. HEENT: intubated Lymph: no adenopathy Neck: no JVD, R internal jugular swan. Cardiac:  normal S1, S2; RRR Lungs: bilateral mechanically ventilated breath sounds  Abd: soft, nontender, no  hepatomegaly  Ext: has some mild edema in legs bilaterally and in hands.  Left groin bandage with bright red blood but no hematoma/ecchymosis.  Musculoskeletal:  No deformities, BUE and BLE strength normal and equal Skin: warm and dry  Neuro:  sedated on vent  Labs    CBC Recent Labs    02/04/23 0207 02/05/23 0417  WBC 8.8 11.0*  HGB 9.9* 9.0*  HCT 28.6* 26.8*  MCV 92.6 93.4  PLT 270 226   Basic Metabolic Panel Recent Labs    16/10/96 0207 02/04/23 1940 02/05/23 0417  NA 125* 125* 126*  K 3.5 4.0 3.9  CL 97* 98 99  CO2 20* 19* 20*  GLUCOSE 136* 141* 127*  BUN 14 17 18   CREATININE 0.74 0.65 0.75  CALCIUM 7.5* 7.2* 7.5*  MG 2.0 2.3 2.1  PHOS 2.3* 2.9  --    Liver Function Tests No results for input(s): "AST", "ALT", "ALKPHOS", "BILITOT", "PROT", "ALBUMIN" in the last 72 hours.  No results for input(s): "LIPASE", "AMYLASE" in the last 72 hours. Cardiac Enzymes No results for input(s): "CKTOTAL", "CKMB", "CKMBINDEX", "TROPONINI" in the last 72 hours. BNP Invalid input(s): "POCBNP" D-Dimer No results for input(s): "DDIMER" in the last 72 hours. Hemoglobin A1C No results for input(s): "HGBA1C" in the last 72 hours.  Fasting Lipid Panel Recent Labs    02/04/23 0207  TRIG 91   Thyroid Function Tests No results for input(s): "TSH", "T4TOTAL", "T3FREE", "THYROIDAB" in the last 72 hours.  Invalid input(s): "FREET3"  Telemetry    Sinus with frequent PVCs, some periods of AV dissociation but no dropped QRSs  - Personally Reviewed  ECG    Sinus with IRBBB and 1st deg AV block, frequent PVCs, HR 89 - Personally Reviewed  Radiology    EP STUDY  Result Date: 02/04/2023 See surgical note for result.  Structural Heart Procedure  Result Date: 02/04/2023 See surgical note for result.  ECHO TEE  Result Date: 02/04/2023    TRANSESOPHOGEAL ECHO REPORT   Patient Name:   MARIYAH ZELLMAN [facility] Date of Exam: 02/04/2023 Medical Rec #:  [id number]           Height:        68.0 in Accession #:    [id number]          Weight:       230.6 lb Date of Birth:  [birth year]           BSA:          2.171 m Patient Age:    67 years            BP:           108/78 mmHg Patient Gender: F                   HR:  63 bpm. Exam Location:  Inpatient Procedure: Limited Echo, Color Doppler and Cardiac Doppler Indications:     Aortic Stenosis i35.0  History:         Patient has prior history of Echocardiogram examinations, most                  recent 02/01/2023. CHF; Risk Factors:Hypertension and                  Dyslipidemia.  Sonographer:     Irving Burton Senior RDCS Referring Phys:  [id number] Janetta Hora Diagnosing Phys: Thurmon Fair MD PROCEDURE: After discussion of the risks and benefits of a TEE, an informed consent was obtained from the patient. The transesophogeal probe was passed without difficulty through the esophogus of the patient. Sedation performed by different physician. The patient was monitored while under deep sedation. Supplementary images were obtained from transthoracic windows as indicated to answer the clinical question. The patient developed no complications during the procedure.  PRE-PROCEDURE FINDINGS Severely decreased left ventricular systolic function. Severe anteroapical hypokinesis. Estimated LVEF 25% Trileaflet aortic valve with moderate-severe calcific stenosis. Aortic valve peak gradient 46 mm Hg, mean gradient 29 mm Hg, dimensionless index 0.30, calculated valve area 1.03 cm sq ( 0.4 cm sq/m sq BSA). Trivial aortic insufficiency. Trivial mitral insufficiency. No pericardial effusion. POST-PROCEDURE FINDINGS Improved, moderately decreased left ventricular systolic function. The wall motion abnormality pattern is similar, but there appears to be improved contractility in the anteroapical wall compared to baseline. Estimated LVEF 35% Well deployed stent valve (TAVR). Aortic valve peak gradient 13 mm Hg, mean gradient 6 mm Hg, dimensionless index 0.77, calculated  valve area 2.43 cm sq (1.12 cm sq/m sq BSA), acceleration time 91 ms. Trivial perivalvular leak anteriorly ("12 o'clock"). Trivial mitral insufficiency. No pericardial effusion.  IMPRESSIONS  1. Left ventricular ejection fraction, by estimation, is 25-35%. The left ventricle has severely decreased function. The left ventricle demonstrates regional wall motion abnormalities (see scoring diagram/findings for description). There is mild concentric left ventricular hypertrophy.  2. Right ventricular systolic function is normal. The right ventricular size is normal.  3. No left atrial/left atrial appendage thrombus was detected.  4. The mitral valve is normal in structure. Trivial mitral valve regurgitation.  5. The aortic valve has been repaired/replaced. Aortic valve regurgitation is trivial. Procedure Date: 02/04/2023. Aortic valve area, by VTI measures 2.43 cm. Aortic valve Vmax measures 1.79 m/s.  6. Counterpulsation balloon pump in the descending aorta with tip ath the junction with the aortic arch. There is mild (Grade II) plaque.  7. Evidence of atrial level shunting detected by color flow Doppler. There is a small patent foramen ovale with predominantly left to right shunting across the atrial septum. FINDINGS  Left Ventricle: Left ventricular ejection fraction, by estimation, is 25-35%. The left ventricle has severely decreased function. The left ventricle demonstrates regional wall motion abnormalities. The left ventricular internal cavity size was normal in  size. There is mild concentric left ventricular hypertrophy.  LV Wall Scoring: The entire anterior wall, mid and distal lateral wall, mid and distal anterior septum, and entire apex are hypokinetic. The antero-lateral wall, inferior wall, basal anteroseptal segment, basal inferolateral segment, mid inferoseptal segment, and basal inferoseptal segment are normal. Right Ventricle: The right ventricular size is normal. No increase in right ventricular wall  thickness. Right ventricular systolic function is normal. Left Atrium: Left atrial size was normal in size. No left atrial/left atrial appendage thrombus was detected. Right Atrium: Right atrial size was  normal in size. Pericardium: Trivial pericardial effusion is present. Mitral Valve: The mitral valve is normal in structure. Trivial mitral valve regurgitation. Tricuspid Valve: The tricuspid valve is normal in structure. Tricuspid valve regurgitation is trivial. Aortic Valve: The aortic valve has been repaired/replaced. Aortic valve regurgitation is trivial. Aortic regurgitation PHT measures 515 msec. Aortic valve mean gradient measures 6.0 mmHg. Aortic valve peak gradient measures 12.8 mmHg. Aortic valve area, by VTI measures 2.43 cm. There is a 26 mm Sapien prosthetic, stented (TAVR) valve present in the aortic position. Pulmonic Valve: The pulmonic valve was grossly normal. Pulmonic valve regurgitation is mild. Aorta: Counterpulsation balloon pump in the descending aorta with tip ath the junction with the aortic arch. The aortic root, ascending aorta and aortic arch are all structurally normal, with no evidence of dilitation or obstruction. There is mild (Grade  II) plaque. IAS/Shunts: Evidence of atrial level shunting detected by color flow Doppler. A small patent foramen ovale is detected with predominantly left to right shunting across the atrial septum. Additional Comments: A venous catheter is visualized in the right ventricle. Spectral Doppler performed. LEFT VENTRICLE PLAX 2D LVOT diam:     2.00 cm LV SV:         71 LV SV Index:   33 LVOT Area:     3.14 cm  AORTIC VALVE AV Area (Vmax):    2.18 cm AV Area (Vmean):   2.57 cm AV Area (VTI):     2.43 cm AV Vmax:           179.00 cm/s AV Vmean:          113.000 cm/s AV VTI:            0.292 m AV Peak Grad:      12.8 mmHg AV Mean Grad:      6.0 mmHg LVOT Vmax:         124.00 cm/s LVOT Vmean:        92.300 cm/s LVOT VTI:          0.226 m LVOT/AV VTI ratio:  0.77 AI PHT:            515 msec  SHUNTS Systemic VTI:  0.23 m Systemic Diam: 2.00 cm Madison Hora Croitoru MD Electronically signed by Thurmon Fair MD Signature Date/Time: 02/04/2023/6:09:39 PM    Final    DG CHEST PORT 1 VIEW  Result Date: 02/04/2023 CLINICAL DATA:  Management for intra-aortic balloon pump EXAM: PORTABLE CHEST 1 VIEW COMPARISON:  02/03/2023 FINDINGS: ETT tip is 1.5 cm above the carina. There is an enteric tube with tip and side port in the stomach. Left IJ pulmonary arterial catheter is noted with tip in the distal right main pulmonary artery. Intra-aortic balloon pump marker is identified at the T6-7 level. On the previous exam this was at the T6-7 level. Stable cardiomediastinal contours. Moderate left pleural effusion appears increased in volume from previous exam. Resolution of previous right base atelectasis. IMPRESSION: 1. Intra-aortic balloon pump marker is identified at the T6-7 level. Unchanged from previous exam. 2. Moderate left pleural effusion appears increased in volume from previous exam. 3. Resolution of previous right base atelectasis. Electronically Signed   By: Signa Kell M.D.   On: 02/04/2023 10:26   DG CHEST PORT 1 VIEW  Result Date: 02/03/2023 CLINICAL DATA:  Heart failure EXAM: PORTABLE CHEST 1 VIEW COMPARISON:  Same day chest radiograph FINDINGS: Endotracheal tube, enteric tube, right IJ approach pulmonary arterial catheter, and IABP marker are stable in positioning  from chest radiograph performed earlier on the same day. Stable heart size. Pulmonary vascular congestion. Small left pleural effusion. Increasing streaky bibasilar opacities. No pneumothorax. IMPRESSION: 1. Increasing streaky bibasilar opacities, likely atelectasis. 2. Small left pleural effusion. 3. Stable support apparatus. Electronically Signed   By: Duanne Guess D.O.   On: 02/03/2023 13:20    Cardiac Studies   CARDIAC CATHETERIZATION   CARDIAC CATHETERIZATION 02/02/2023   Narrative Images  from the original result were not included. 1. Ultrasound guided right common femoral artery access 2. Intraaortic balloon pump placement   In case of recurrent VT/VT or hemodynamic decompensation, will consider MCS escalation.     Elder Negus, MD Pager: [phone] Office: [phone]     CARDIAC CATHETERIZATION 02/02/2023   Narrative Images from the original result were not included.     Aggressive GDMT for HFrEF   Right and left heart catheterization 02/02/2023: LM: Normal LAD: Patnet mid LAD stent with no restenosis.stent thrombosis Lcx: Mid diffuse 50% disease Ramus: Patent stent. No restenosis RCA: Mid eccentric 40% disease. Rest mild diffuse disease   RA: 21 mmHg PA: 50/29 mmHg, mPAP 38 mmHg PCW: 29 mmHg   LV: 127/12 mmHg Ao: 102/77 mmHg CO: 5.5 L/min CI: 2.7 L/min/m2   Swan catheter left in place. IV lasix 20X2 administered in cath lab. Continue IV amiodarone. Needs aggressive HFrEF management. I had a long conversation with patient and family regarding importance of HFrEF management. Will get heart failure team consult.     Elder Negus, MD Pager: [phone] Office: [phone]   Findings Coronary Findings Diagnostic  Dominance: Right   Left Anterior Descending Vessel is normal in caliber. Vessel is angiographically normal. Non-stenotic Mid LAD-1 lesion was previously treated. The lesion is thrombotic. Previously placed stent displays rethrombosis. Non-stenotic Mid LAD-2 lesion was previously treated. The lesion is mildly calcified. Mid LAD-3 lesion is 40% stenosed.   Ramus Intermedius Vessel is large. Non-stenotic Ramus-1 lesion was previously treated. The lesion is concentric. Non-stenotic Ramus-2 lesion with 0% stenosed side branch in Lat Ramus was previously treated. The lesion is type C and concentric. The lesion is calcified.   Lateral Ramus Intermedius Vessel is large in size.   Left Circumflex Prox Cx to Dist Cx  lesion is 50% stenosed.   Right Coronary Artery The vessel exhibits minimal luminal irregularities. Mid RCA lesion is 40% stenosed.   Intervention   No interventions have been documented.   STRESS TESTS   MYOCARDIAL PERFUSION IMAGING 04/04/2018   ECHOCARDIOGRAM   ECHOCARDIOGRAM COMPLETE 02/01/2023   Narrative ECHOCARDIOGRAM REPORT       Patient Name:   LOTOYA TREGLIA Lohmann Date of Exam: 02/01/2023 Medical Rec #:  [id number]           Height:       68.0 in Accession #:    [id number]          Weight:       205.2 lb Date of Birth:  02/13/1956           BSA:          2.066 m Patient Age:    67 years            BP:           108/67 mmHg Patient Gender: F                   HR:           90 bpm. Exam Location:  Inpatient  Procedure: 2D Echo, Cardiac Doppler, Color Doppler and Intracardiac Opacification Agent   Indications:    Cardiac arrest I46.9   History:        Patient has prior history of Echocardiogram examinations, most recent 01/25/2023. Aortic Valve Disease; Risk Factors:Hypertension.   Sonographer:    Darlys Gales Referring Phys: [id number] Beulah Gandy PAYNE   IMPRESSIONS     1. Left ventricular ejection fraction, by estimation, is 20 to 25%. The left ventricle has severely decreased function. The left ventricle demonstrates regional wall motion abnormalities (see scoring diagram/findings for description). The left ventricular internal cavity size was moderately dilated. There is mild concentric left ventricular hypertrophy. Left ventricular diastolic parameters are indeterminate. 2. Right ventricular systolic function is normal. The right ventricular size is normal. Tricuspid regurgitation signal is inadequate for assessing PA pressure. 3. Left atrial size was moderately dilated. 4. The mitral valve is normal in structure. No evidence of mitral valve regurgitation. No evidence of mitral stenosis. 5. The aortic valve is calcified. Aortic valve regurgitation is not visualized.  Mild to moderate aortic valve stenosis. Aortic valve area, by VTI measures 1.10 cm. Aortic valve mean gradient measures 24.5 mmHg. Aortic valve Vmax measures 2.96 m/s. 6. The inferior vena cava is normal in size with greater than 50% respiratory variability, suggesting right atrial pressure of 3 mmHg.   FINDINGS Left Ventricle: Left ventricular ejection fraction, by estimation, is 20 to 25%. The left ventricle has severely decreased function. The left ventricle demonstrates regional wall motion abnormalities. Definity contrast agent was given IV to delineate the left ventricular endocardial borders. The left ventricular internal cavity size was moderately dilated. There is mild concentric left ventricular hypertrophy. Left ventricular diastolic parameters are indeterminate.     LV Wall Scoring: The anterior septum and apex are akinetic. The entire anterior wall, antero-lateral wall, inferior septum, entire inferior wall, and apical lateral segment are hypokinetic.   Right Ventricle: The right ventricular size is normal. No increase in right ventricular wall thickness. Right ventricular systolic function is normal. Tricuspid regurgitation signal is inadequate for assessing PA pressure.   Left Atrium: Left atrial size was moderately dilated.   Right Atrium: Right atrial size was normal in size.   Pericardium: There is no evidence of pericardial effusion.   Mitral Valve: The mitral valve is normal in structure. No evidence of mitral valve regurgitation. No evidence of mitral valve stenosis.   Tricuspid Valve: The tricuspid valve is normal in structure. Tricuspid valve regurgitation is not demonstrated. No evidence of tricuspid stenosis.   Aortic Valve: The aortic valve is calcified. Aortic valve regurgitation is not visualized. Mild to moderate aortic stenosis is present. Aortic valve mean gradient measures 24.5 mmHg. Aortic valve peak gradient measures 35.0 mmHg. Aortic valve area, by VTI  measures 1.10 cm.   Pulmonic Valve: The pulmonic valve was normal in structure. Pulmonic valve regurgitation is not visualized. No evidence of pulmonic stenosis.   Aorta: The aortic root is normal in size and structure.   Venous: The inferior vena cava is normal in size with greater than 50% respiratory variability, suggesting right atrial pressure of 3 mmHg.   IAS/Shunts: No atrial level shunt detected by color flow Doppler.     LEFT VENTRICLE PLAX 2D LVIDd:         5.50 cm   Diastology LVIDs:         5.20 cm   LV e' medial:    9.14 cm/s LV PW:  1.20 cm   LV E/e' medial:  8.9 LV IVS:        1.10 cm   LV e' lateral:   4.13 cm/s LVOT diam:     1.80 cm   LV E/e' lateral: 19.7 LV SV:         70 LV SV Index:   34 LVOT Area:     2.54 cm     RIGHT VENTRICLE             IVC RV S prime:     12.00 cm/s  IVC diam: 2.50 cm TAPSE (M-mode): 3.4 cm   LEFT ATRIUM             Index        RIGHT ATRIUM          Index LA Vol (A2C):   79.7 ml 38.57 ml/m  RA Area:     9.99 cm LA Vol (A4C):   86.3 ml 41.76 ml/m  RA Volume:   17.90 ml 8.66 ml/m LA Biplane Vol: 87.1 ml 42.15 ml/m AORTIC VALVE AV Area (Vmax):    1.07 cm AV Area (Vmean):   0.98 cm AV Area (VTI):     1.10 cm AV Vmax:           296.00 cm/s AV Vmean:          242.500 cm/s AV VTI:            0.638 m AV Peak Grad:      35.0 mmHg AV Mean Grad:      24.5 mmHg LVOT Vmax:         125.00 cm/s LVOT Vmean:        93.100 cm/s LVOT VTI:          0.275 m LVOT/AV VTI ratio: 0.43   MITRAL VALVE MV Area (PHT): 4.33 cm     SHUNTS MV Decel Time: 175 msec     Systemic VTI:  0.28 m MV E velocity: 81.20 cm/s   Systemic Diam: 1.80 cm MV A velocity: 133.00 cm/s MV E/A ratio:  0.61   Kardie Tobb DO Electronically signed by Thomasene Ripple DO Signature Date/Time: 02/01/2023/11:38:32 AM       Final     CT SCANS   CT CORONARY MORPH W/CTA COR W/SCORE 01/28/2023   Addendum 01/28/2023  5:37 PM ADDENDUM REPORT: 01/28/2023 17:01    CLINICAL DATA:  Aortic stenosis   EXAM: Cardiac TAVR CT   TECHNIQUE: The patient was scanned on a Siemens Force 192 slice scanner. A 120 kV retrospective scan was triggered in the descending thoracic aorta at 111 HU's. Gantry rotation speed was 270 msecs and collimation was .9 mm. No beta blockade or nitro were given. The 3D data set was reconstructed in 5% intervals of the R-R cycle. Systolic and diastolic phases were analyzed on a dedicated work station using MPR, MIP and VRT modes. The patient received 80 cc of contrast.   FINDINGS: Aortic Valve: Tri leaflet aortic valve calcified with restricted motion Calcium Score 2137 Large area of bulky calcification involving the non coronary cusp   Aorta: No aneurysm moderate calcific atherosclerosis   Sinotubular Junction: 23 mm   Ascending Thoracic Aorta: 32 mm   Aortic Arch: 30 mm   Descending Thoracic Aorta: 23 mm   Sinus of Valsalva Measurements:   Non-coronary: 30 mm   height 21 mm   Right - coronary: 29.4 mm  height 19 mm   Left - coronary: 28.1  mm   height 20.5 mm   Coronary Artery Height above Annulus:   Left Main: 13.9 mm above annulus   Right Coronary: 15.3 mm above annulus   Virtual Basal Annulus Measurements:   Maximum/Minimum Diameter: 25.1 mm x 22.3 mm Average diameter 23.7 mm   Perimeter: 75.2 mm   Area: 440 mm2   Coronary Arteries: Sufficient height above annulus for deployment   Optimum Fluoroscopic Angle for Delivery: LAO 1 Caudal 2 degrees   Membranous septal length 5.3 mm   IMPRESSION: 1. Calcified tri leaflet AV with score 2137 Bulky calcium noted on non coronary cusp   2. Annular area 440 mm2 suitable on lower end for 26 mm Sapien Ultra valve. Left coronary sinus small to consider 29 mm Medtronic Evolut   3.  Optimum angiographic angle for deployment LAO 1 Caudal 2 degrees   4.  Membranous septal length 5.3 mm   5.  Coronary arteries sufficient height above annulus for deployment    Charlton Haws     Electronically Signed By: Charlton Haws M.D. On: 01/28/2023 17:01   Narrative EXAM: OVER-READ INTERPRETATION  CT CHEST   The following report is a limited chest CT over-read performed by radiologist Dr. Allegra Lai of [facility] on 01/28/2023. This over-read does not include interpretation of cardiac or coronary anatomy or pathology. The cardiac TAVR interpretation by the cardiologist is attached.   COMPARISON:  None Available.   FINDINGS: Extracardiac findings will be described separately under dictation for contemporaneously obtained CTA chest, abdomen and pelvis.   IMPRESSION: Please see separate dictation for contemporaneously obtained CTA chest, abdomen and pelvis dated 01/28/2023 for full description of relevant extracardiac findings.   Electronically Signed: By: Allegra Lai M.D. On: 01/28/2023 16:24   ____________________________   [facility]  TAVR OPERATIVE NOTE     Date of Procedure:                02/04/2023   Preoperative Diagnosis:Severe Aortic Stenosis and cardiogenic shock   Postoperative Diagnosis:    Same    Procedure:        Transcatheter Aortic Valve Replacement - Transfemoral Approach             Edwards Sapien 3 Resilia THV (size 26 mm, model # 9755RLS, serial # [id number] )              Co-Surgeons:                         Eugenio Hoes, MD and Alverda Skeans, MD Anesthesiologist:                  [name], MD   Echocardiographer:              Thurmon Fair, MD   Pre-operative Echo Findings: Severe aortic stenosis Severe left ventricular systolic dysfunction   Post-operative Echo Findings: Trace paravalvular leak Moderate left ventricular systolic dysfunction   Left Heart Catheterization Findings: Left ventricular end-diastolic pressure of    Patient Profile     Madison West is a 67 y.o. female with a hx of CAD s/p PCI to the RCA 02/2020, PSVT terminated  with adenosine, breast cancer s/p chemo/XRT in 2009, HTN, HLD, s/p R CEA 2020, obesity, former smoker, extreme anxiety with multiple medication intolerances/phobias, and aortic stenosis who is being seen today for the evaluation of severe LFLG AS in the setting of recurrent VT arrest and cardiogenic  shock at the request of Dr. Gala Romney.   Assessment & Plan    Severe LFLG AS: s/p successful TAVR with a 26 mm Edwards S3UR via the left TF approach on 02/04/23. Repeat echo today.  Tele with some periods of AV dissociation but no dropped QRSs. Will get repeat ECG today. Groin sites healing well. Left groin bandage with bright red blood but no hematoma/ecchymosis. Continue DAPT  Fevers: developed fevers with a Tmax 100.9. Resolved on broad spectrum Abx. Procalcitonin negative. Blood cultures with NGTD.  Acute systolic CHF with cardiogenic shock: Remains on IABP at 1:1. On milrinone 0.25 + 6 NE. CO-OX 73%. MAP 74   SWAN #s CVP 12 PA 31/24 (27) CO 4.20 CI 2.20 SVR 1181  Papi 0.58  Net negative 5.6L but positive 1.3L overnight. Increasing Lasix to BID. Renal function remains normal. Appreciate advanced CHF team.  Refractory VT: s/p multiple shocks. Still with frequent ventricular ectopy but no recent VT on lidocaine and amiodarone gtts. Electrolytes supplemented.   CAD: hx PCI to RCA in 2021. -Presented with unstable angina. S/p PCI/stent to LAD on 08/07. Stent thrombosis 08/09 treated with thrombectomy -HS troponin > 24K on 08/12 -LHC 08/12 with patent LAD stent -Continue DAPT with aspirin and Brilinta. Declined statin therapy in the past.   Hyponatremia: Na 126. Per heart failure, will start on Tolvaptan is drops below 125   Carotid artery stenosis: s/p R CEA in 2020  Respiratory failure: sedated on vent. Trying to diurese more before extubation. Appreciate PCCM   Signed, Cline Crock, PA-C  02/05/2023, 8:08 AM  Pager [phone]  ATTENDING ATTESTATION:  After conducting a review of  all available clinical information with the care team, interviewing the patient, and performing a physical exam, I agree with the findings and plan described in this note.   GEN: Intubated, sedated  HEENT:  Intubated, RIJ swan in place Cardiac: RRR  + IABP sounds Respiratory: Ant lung fields clear GI: Soft, nontender, non-distended  MS: No edema; No deformity. Neuro:  Nonfocal  Vasc:  R radial arterial line, R IABP; LFA site intact; L radial site intact  Patient doing well after urgent TAVR yesterday from the left transfemoral approach.  Low-dose heparin was started last night.  The patient's access sites are intact.  There are plans to wean the intra-aortic balloon pump today.  Her telemetry does show some dissociative rhythms on telemetry which were present yesterday prior to the procedure.  She fortunately has not had any recurrent VT or VF.  Would continue to monitor for now.  Will follow-up echocardiogram to be done today.  Continue DAPT for stents and TAVR valve with the latter only requiring aspirin monotherapy.  Further management by advanced heart failure and critical care medicine.    CRITICAL CARE Performed by: Orbie Pyo   Total critical care time: 30 minutes  Critical care time was exclusive of separately billable procedures and treating other patients.  Critical care was necessary to treat or prevent imminent or life-threatening deterioration.  Critical care was time spent personally by me on the following activities: development of treatment plan with patient and/or surrogate as well as nursing, discussions with consultants, evaluation of patient's response to treatment, examination of patient, obtaining history from patient or surrogate, ordering and performing treatments and interventions, ordering and review of laboratory studies, ordering and review of radiographic studies, pulse oximetry and re-evaluation of patient's condition.   Alverda Skeans, MD Pager  [phone]

## 2023-02-05 NOTE — Progress Notes (Signed)
   Patient seen on evening ICU rounds.   Remains intubated. Diuresed well today but still with some volume on board.   Hemodynamics remain margianl despite milrinone 0.25, NE 6 and IABP 1:1  In preparation for possible IABP removal, I turned IABP down to 1:3 and increased milrinone to 0.3 (did not want to go higher with recent VT).  She remained relatively stable over this period but repeat co-ox and swan numbers a bit worse so I decided to not pull IABP tonight.   Heparin increased to full dose.   Will try to wean IABP again in am followed by extubation, if possible.   Additional CCT 45 min  Arvilla Meres, MD  10:40 PM

## 2023-02-06 DIAGNOSIS — R570 Cardiogenic shock: Secondary | ICD-10-CM | POA: Diagnosis not present

## 2023-02-06 LAB — BPAM RBC
Blood Product Expiration Date: 202408272359
Blood Product Expiration Date: 202408272359
Blood Product Expiration Date: 202409062359
Blood Product Expiration Date: 202409062359
ISSUE DATE / TIME: 202408061303
ISSUE DATE / TIME: 202408061729
ISSUE DATE / TIME: 202408091333
ISSUE DATE / TIME: 202408091333
Unit Type and Rh: 5100
Unit Type and Rh: 5100
Unit Type and Rh: 5100
Unit Type and Rh: 5100

## 2023-02-06 LAB — HEPARIN LEVEL (UNFRACTIONATED)
Heparin Unfractionated: <0.1 [IU]/mL — ABNORMAL LOW (ref 0.30–0.70)
Heparin Unfractionated: <0.1 [IU]/mL — ABNORMAL LOW (ref 0.30–0.70)

## 2023-02-06 LAB — BASIC METABOLIC PANEL
Anion gap: 10 (ref 5–15)
BUN: 25 mg/dL — ABNORMAL HIGH (ref 8–23)
CO2: 22 mmol/L (ref 22–32)
Calcium: 7.6 mg/dL — ABNORMAL LOW (ref 8.9–10.3)
Chloride: 98 mmol/L (ref 98–111)
Creatinine, Ser: 0.73 mg/dL (ref 0.44–1.00)
GFR, Estimated: >60 mL/min (ref >60)
Glucose, Bld: 136 mg/dL — ABNORMAL HIGH (ref 70–99)
Potassium: 3.4 mmol/L — ABNORMAL LOW (ref 3.5–5.1)
Sodium: 130 mmol/L — ABNORMAL LOW (ref 135–145)

## 2023-02-06 LAB — COOXEMETRY PANEL
Carboxyhemoglobin: 1.6 % — ABNORMAL HIGH (ref 0.5–1.5)
Carboxyhemoglobin: 1.6 % — ABNORMAL HIGH (ref 0.5–1.5)
Methemoglobin: 1 % (ref 0.0–1.5)
Methemoglobin: <0.7 % (ref 0.0–1.5)
O2 Saturation: 76.4 %
O2 Saturation: 66 %
Total hemoglobin: 9.2 g/dL — ABNORMAL LOW (ref 12.0–16.0)
Total hemoglobin: 9.7 g/dL — ABNORMAL LOW (ref 12.0–16.0)

## 2023-02-06 LAB — CBC
HCT: 25.9 % — ABNORMAL LOW (ref 36.0–46.0)
Hemoglobin: 8.8 g/dL — ABNORMAL LOW (ref 12.0–15.0)
MCH: 31.3 pg (ref 26.0–34.0)
MCHC: 34 g/dL (ref 30.0–36.0)
MCV: 92.2 fL (ref 80.0–100.0)
Platelets: 226 10*3/uL (ref 150–400)
RBC: 2.81 MIL/uL — ABNORMAL LOW (ref 3.87–5.11)
RDW: 13.8 % (ref 11.5–15.5)
WBC: 9.5 10*3/uL (ref 4.0–10.5)
nRBC: 0 % (ref 0.0–0.2)

## 2023-02-06 LAB — POCT ACTIVATED CLOTTING TIME: Activated Clotting Time: 104 seconds

## 2023-02-06 LAB — GLUCOSE, CAPILLARY
Glucose-Capillary: 135 mg/dL — ABNORMAL HIGH (ref 70–99)
Glucose-Capillary: 135 mg/dL — ABNORMAL HIGH (ref 70–99)
Glucose-Capillary: 148 mg/dL — ABNORMAL HIGH (ref 70–99)
Glucose-Capillary: 121 mg/dL — ABNORMAL HIGH (ref 70–99)
Glucose-Capillary: 135 mg/dL — ABNORMAL HIGH (ref 70–99)

## 2023-02-06 LAB — MAGNESIUM: Magnesium: 1.9 mg/dL (ref 1.7–2.4)

## 2023-02-06 MED ORDER — POLYETHYLENE GLYCOL 3350 17 G PO PACK
17.0000 g | PACK | Freq: Two times a day (BID) | ORAL | Status: DC
  Administered 2023-02-06 – 2023-02-07 (×2): 17 g
  Filled 2023-02-06 (×2): qty 1

## 2023-02-06 MED ORDER — ORAL CARE MOUTH RINSE: 15.0000 mL | OROMUCOSAL | Status: DC | PRN

## 2023-02-06 MED ORDER — MAGNESIUM SULFATE 2 GM/50ML IV SOLN
2.0000 g | Freq: Once | INTRAVENOUS | Status: AC
  Administered 2023-02-06: 2 g via INTRAVENOUS
  Filled 2023-02-06: qty 50

## 2023-02-06 MED ORDER — ROSUVASTATIN CALCIUM 20 MG PO TABS
20.0000 mg | ORAL_TABLET | Freq: Every day | ORAL | Status: DC
  Administered 2023-02-07: 20 mg
  Filled 2023-02-06: qty 1

## 2023-02-06 MED ORDER — POTASSIUM CHLORIDE 20 MEQ PO PACK
40.0000 meq | PACK | Freq: Two times a day (BID) | ORAL | Status: DC
  Administered 2023-02-06: 40 meq via ORAL
  Filled 2023-02-06: qty 2

## 2023-02-06 MED ORDER — ORAL CARE MOUTH RINSE
15.0000 mL | OROMUCOSAL | Status: DC
  Administered 2023-02-06 – 2023-02-07 (×15): 15 mL via OROMUCOSAL

## 2023-02-06 MED ORDER — POTASSIUM CHLORIDE 20 MEQ PO PACK
40.0000 meq | PACK | Freq: Two times a day (BID) | ORAL | Status: AC
  Administered 2023-02-06: 40 meq
  Filled 2023-02-06: qty 2

## 2023-02-06 MED ORDER — FUROSEMIDE 10 MG/ML IJ SOLN
80.0000 mg | Freq: Two times a day (BID) | INTRAMUSCULAR | Status: AC
  Administered 2023-02-06 (×2): 80 mg via INTRAVENOUS
  Filled 2023-02-06 (×2): qty 8

## 2023-02-06 MED ORDER — POTASSIUM CHLORIDE 20 MEQ PO PACK
40.0000 meq | PACK | Freq: Once | ORAL | Status: AC
  Administered 2023-02-06: 40 meq
  Filled 2023-02-06: qty 2

## 2023-02-06 MED ORDER — MILRINONE LACTATE IN DEXTROSE 20-5 MG/100ML-% IV SOLN
0.1250 ug/kg/min | INTRAVENOUS | Status: DC
  Administered 2023-02-06 – 2023-02-09 (×8): 0.375 ug/kg/min via INTRAVENOUS
  Administered 2023-02-09 – 2023-02-11 (×4): 0.25 ug/kg/min via INTRAVENOUS
  Filled 2023-02-06 (×12): qty 100

## 2023-02-06 NOTE — Progress Notes (Signed)
ANTICOAGULATION CONSULT NOTE -  Consult  Pharmacy Consult for Heparin Indication: IABP  Allergies  Allergen Reactions   Prednisone Palpitations   Fentanyl Swelling and Other (See Comments)    Makes pt "hyper" feels she is "climbing the walls."  Last time she had a colonoscopy with Fentanyl and Versed she was awake the whole time.   Versed [Midazolam] Swelling and Anxiety    Makes pt "hyper" feels she is "climbing the walls."  Last time she had a colonoscopy with Fentanyl and Versed she was awake the whole time.   Vicodin [Hydrocodone-Acetaminophen] Anxiety    Extreme anxiety.  OK to take oxycodone.    Patient Measurements: Height: 5\' 8"  (172.7 cm) Weight: 105.1 kg (231 lb 11.3 oz) IBW/kg (Calculated) : 63.9 Heparin Dosing Weight: 85 kg  Vital Signs: Temp: 97.7 F (36.5 C) (08/17 0000) Temp Source: Core (08/17 0000) Pulse Rate: 64 (08/17 0000)  Labs: Recent Labs    02/03/23 0749 02/03/23 1603 02/04/23 0207 02/04/23 0400 02/04/23 1940 02/05/23 0417 02/05/23 1948 02/06/23 0016  HGB 10.5*  --  9.9*  --   --  9.0*  --   --   HCT 31.0*  --  28.6*  --   --  26.8*  --   --   PLT  --   --  270  --   --  226  --   --   HEPARINUNFRC  --   --   --  <0.10*  --  <0.10*  --  <0.10*  CREATININE  --    < > 0.74  --  0.65 0.75 0.72  --    < > = values in this interval not displayed.    Estimated Creatinine Clearance: 84.2 mL/min (by C-G formula based on SCr of 0.72 mg/dL).   Medical History: Past Medical History:  Diagnosis Date   Anxiety    Arthritis    low back and hip pain intermittent   Breast cancer (HCC) 06/02/07   r breast -surgery ,radiaology. chemotherapy   Carpal tunnel syndrome    right hand   Colon polyps    Complication of anesthesia    Fentanyl, Versed-makes extra hyper, bradycardia x 1 in PACU, [facility] (08/15/11 cardiology felt neostigmine may have resulted in AV nodal block)    Coronary artery disease    Depression    denies   Dysplasia of vulva     Hypertension    Palpitations    PSVT, s/p adenosine 08/04/16   S/P breast lumpectomy 07/04/07   R breast   S/P radiation therapy 2009    Medications:  Scheduled:   ALPRAZolam  1 mg Per Tube Q6H   aspirin  81 mg Per Tube Daily   busPIRone  5 mg Per Tube BID   Chlorhexidine Gluconate Cloth  6 each Topical Daily   docusate  100 mg Per Tube BID   famotidine  20 mg Per Tube BID   feeding supplement (PROSource TF20)  60 mL Per Tube BID   fentaNYL (SUBLIMAZE) injection  50 mcg Intravenous Once   insulin aspart  2-6 Units Subcutaneous Q4H   lidocaine  1 patch Transdermal Q24H   mupirocin ointment  1 Application Nasal BID   mouth rinse  15 mL Mouth Rinse Q2H   polyethylene glycol  17 g Oral BID   rosuvastatin  20 mg Oral Daily   sodium chloride flush  3 mL Intravenous Q12H   sodium chloride flush  3 mL Intravenous Q12H  ticagrelor  90 mg Per Tube BID    Assessment: 67 y.o. female with CHF s/p PCI, now with recurrent VT/VF s/p IABP placement for heparin. S/p TAVR IABP remains in place  - plan for possible removal 8/17.  Heparin level came back <0.1, on heparin infusion at 1100 units/hr. No s/sx of bleeding or infusion issues per nursing.   Goal of Therapy:  Heparin level 0.2-0.5 units/mL Monitor platelets by anticoagulation protocol: Yes   Plan:  Increase heparin to 1200 units/hr.  Check 6 hour heparin level.  Monitor daily HL, CBC, and for s/sx of bleeding   Thank you for allowing pharmacy to participate in this patient's care,  Sherron Monday, PharmD, BCCCP Clinical Pharmacist  Phone: [phone] 02/06/2023 12:52 AM  Please check AMION for all [facility] phone numbers After 10:00 PM, call Main Pharmacy [phone]

## 2023-02-06 NOTE — Progress Notes (Addendum)
ANTICOAGULATION CONSULT NOTE -  Consult  Pharmacy Consult for Heparin Indication: IABP  Allergies  Allergen Reactions   Prednisone Palpitations   Fentanyl Swelling and Other (See Comments)    Makes pt "hyper" feels she is "climbing the walls."  Last time she had a colonoscopy with Fentanyl and Versed she was awake the whole time.   Versed [Midazolam] Swelling and Anxiety    Makes pt "hyper" feels she is "climbing the walls."  Last time she had a colonoscopy with Fentanyl and Versed she was awake the whole time.   Vicodin [Hydrocodone-Acetaminophen] Anxiety    Extreme anxiety.  OK to take oxycodone.    Patient Measurements: Height: 5\' 8"  (172.7 cm) Weight: 104.8 kg (231 lb 0.7 oz) IBW/kg (Calculated) : 63.9 Heparin Dosing Weight: 85 kg  Vital Signs: Temp: 97.5 F (36.4 C) (08/17 1925) Temp Source: Core (08/17 1600) Pulse Rate: 64 (08/17 1925)  Labs: Recent Labs    02/04/23 0207 02/04/23 0400 02/05/23 0417 02/05/23 1948 02/06/23 0016 02/06/23 0512 02/06/23 0931  HGB 9.9*  --  9.0*  --   --  8.8*  --   HCT 28.6*  --  26.8*  --   --  25.9*  --   PLT 270  --  226  --   --  226  --   HEPARINUNFRC  --    < > <0.10*  --  <0.10*  --  <0.10*  CREATININE 0.74   < > 0.75 0.72  --  0.73  --    < > = values in this interval not displayed.    Estimated Creatinine Clearance: 84.1 mL/min (by C-G formula based on SCr of 0.73 mg/dL).   Medical History: Past Medical History:  Diagnosis Date   Anxiety    Arthritis    low back and hip pain intermittent   Breast cancer (HCC) 06/02/07   r breast -surgery ,radiaology. chemotherapy   Carpal tunnel syndrome    right hand   Colon polyps    Complication of anesthesia    Fentanyl, Versed-makes extra hyper, bradycardia x 1 in PACU, [facility] (08/15/11 cardiology felt neostigmine may have resulted in AV nodal block)    Coronary artery disease    Depression    denies   Dysplasia of vulva    Hypertension    Palpitations    PSVT, s/p  adenosine 08/04/16   S/P breast lumpectomy 07/04/07   R breast   S/P radiation therapy 2009    Medications:  Scheduled:   ALPRAZolam  1 mg Per Tube Q6H   aspirin  81 mg Per Tube Daily   busPIRone  5 mg Per Tube BID   Chlorhexidine Gluconate Cloth  6 each Topical Daily   docusate  100 mg Per Tube BID   famotidine  20 mg Per Tube BID   feeding supplement (PROSource TF20)  60 mL Per Tube BID   fentaNYL (SUBLIMAZE) injection  50 mcg Intravenous Once   insulin aspart  2-6 Units Subcutaneous Q4H   lidocaine  1 patch Transdermal Q24H   mupirocin ointment  1 Application Nasal BID   mouth rinse  15 mL Mouth Rinse Q2H   polyethylene glycol  17 g Per Tube BID   potassium chloride  40 mEq Per Tube BID   [START ON 02/07/2023] rosuvastatin  20 mg Per Tube Daily   sodium chloride flush  3 mL Intravenous Q12H   sodium chloride flush  3 mL Intravenous Q12H   ticagrelor  90  mg Per Tube BID    Assessment: 67 y.o. female with CHF s/p PCI, now with recurrent VT/VF s/p IABP placement for heparin. S/p TAVR IABP remains in place  - plan for possible removal 8/17.  Heparin level came back <0.1 this AM> IABP was removed this afternoon and heparin consult stopped.   Goal of Therapy:  Heparin level 0.2-0.5 units/mL Monitor platelets by anticoagulation protocol: Yes   Plan:  Heparin stopped per MD F/u DVT ppx in AM  Thank you for involving pharmacy in this patient's care.  Enos Fling, PharmD, BCPS Clinical Pharmacist 02/06/2023 7:32 PM

## 2023-02-06 NOTE — Progress Notes (Signed)
IABP removed from right femoral artery. Manual pressure applied for 40 minutes. Site level 0 , no s+s of hematoma. Tegaderm dressing applied, Patient intubated and sedated.  Bilateral dp and pt pulses present with doppler.    Bedrest begins at 14:20:00

## 2023-02-06 NOTE — Progress Notes (Signed)
[facility] ADULT ICU REPLACEMENT PROTOCOL   The patient does apply for the [facility] Adult ICU Electrolyte Replacment Protocol based on the criteria listed below:   1.Exclusion criteria: TCTS, ECMO, Dialysis, and Myasthenia Gravis patients 2. Is GFR >/= 30 ml/min? Yes.    Patient's GFR today is >60 3. Is SCr </= 2? Yes.   Patient's SCr is 0.73 mg/dL 4. Did SCr increase >/= 0.5 in 24 hours? No. 5.Pt's weight >40kg  Yes.   6. Abnormal electrolyte(s): K+ = 3.4, Mg = 1.9  7. Electrolytes replaced per protocol 8.  Call MD STAT for K+ </= 2.5, Phos </= 1, or Mag </= 1 Physician:  Warrick Parisian, eMD   Faye Ramsay 02/06/2023 6:12 AM

## 2023-02-06 NOTE — Progress Notes (Signed)
Patient ID: [name], female   DOB: 07-02-1955, 67 y.o.   MRN: [id number]     Advanced Heart Failure Rounding Note  PCP-Cardiologist: None   Subjective:    8/15 s/p TAVR. Coronary angiogram w/ widely patent stent. Post op echo w/ no paravalvular leak. - Echo (8/16): EF 35-40%, mild LVH, WMAs, RV normal, bioprosthetic aortic valve s/p TAVR with mean gradient 13 mmH, trivial PVL, IVC mildly dilated.   Remains on IABP at 1:1. On milrinone 0.375 + NE 7.  Diuresed yesterday, I/Os -1150 cc net.  Weaned IABP 1:3 last night, decided to leave in with marginal hemodynamics.    SWAN #s CVP 12-13 PA 31/23 CI 2.79 SVR 1052 Co-ox 76%  No further VT on amiodarone gtt.   Afebrile.  PNA, Enterobacter cloacae on trach aspirate culture.  Remains on Vancomycin + Cefepime.    Objective:   Weight Range: 104.8 kg Body mass index is 35.13 kg/m.   Vital Signs:   Temp:  [93 F (33.9 C)-98.2 F (36.8 C)] 97.9 F (36.6 C) (08/17 0945) Pulse Rate:  [47-107] 58 (08/17 0945) Resp:  [0-26] 20 (08/17 0945) SpO2:  [91 %-99 %] 93 % (08/17 0915) Arterial Line BP: (98-198)/(32-61) 117/47 (08/17 0945) FiO2 (%):  [50 %] 50 % (08/17 0811) Weight:  [104.8 kg] 104.8 kg (08/17 0309) Last BM Date : 02/01/23  Weight change: Filed Weights   02/04/23 0500 02/05/23 0124 02/06/23 0309  Weight: 104.6 kg 105.1 kg 104.8 kg    Intake/Output:   Intake/Output Summary (Last 24 hours) at 02/06/2023 0958 Last data filed at 02/06/2023 0900 Gross per 24 hour  Intake 4138.28 ml  Output 4870 ml  Net -731.72 ml      Physical Exam    CVP 12-13  General: Intubated, awake.  Neck: JVP 12 cm, no thyromegaly or thyroid nodule.  Lungs: Clear to auscultation bilaterally with normal respiratory effort. CV: Nondisplaced PMI.  Heart regular S1/S2, no S3/S4, 2/6 SEM RUSB.  1+ ankle edema.  Abdomen: Soft, nontender, no hepatosplenomegaly, no distention.  Skin: Intact without lesions or rashes.  Neurologic: Follows  commands Extremities: No clubbing or cyanosis.  HEENT: Normal.   Telemetry   NSR. No further VT.  Personally reviewed.   Labs    CBC Recent Labs    02/05/23 0417 02/06/23 0512  WBC 11.0* 9.5  HGB 9.0* 8.8*  HCT 26.8* 25.9*  MCV 93.4 92.2  PLT 226 226   Basic Metabolic Panel Recent Labs    40/98/11 0207 02/04/23 1940 02/05/23 0417 02/05/23 1948 02/06/23 0512  NA 125* 125* 126* 128* 130*  K 3.5 4.0 3.9 3.9 3.4*  CL 97* 98 99 98 98  CO2 20* 19* 20* 21* 22  GLUCOSE 136* 141* 127* 132* 136*  BUN 14 17 18 20  25*  CREATININE 0.74 0.65 0.75 0.72 0.73  CALCIUM 7.5* 7.2* 7.5* 7.4* 7.6*  MG 2.0 2.3 2.1  --  1.9  PHOS 2.3* 2.9  --   --   --    Liver Function Tests No results for input(s): "AST", "ALT", "ALKPHOS", "BILITOT", "PROT", "ALBUMIN" in the last 72 hours.  No results for input(s): "LIPASE", "AMYLASE" in the last 72 hours. Cardiac Enzymes No results for input(s): "CKTOTAL", "CKMB", "CKMBINDEX", "TROPONINI" in the last 72 hours.  BNP: BNP (last 3 results) Recent Labs    01/23/23 2208  BNP 462.3*    ProBNP (last 3 results) No results for input(s): "PROBNP" in the last 8760 hours.  D-Dimer No results for input(s): "DDIMER" in the last 72 hours. Hemoglobin A1C No results for input(s): "HGBA1C" in the last 72 hours.  Fasting Lipid Panel Recent Labs    02/04/23 0207  TRIG 91   Thyroid Function Tests No results for input(s): "TSH", "T4TOTAL", "T3FREE", "THYROIDAB" in the last 72 hours.  Invalid input(s): "FREET3"  Other results:   Imaging    No results found.   Medications:     Scheduled Medications:  ALPRAZolam  1 mg Per Tube Q6H   aspirin  81 mg Per Tube Daily   busPIRone  5 mg Per Tube BID   Chlorhexidine Gluconate Cloth  6 each Topical Daily   docusate  100 mg Per Tube BID   famotidine  20 mg Per Tube BID   feeding supplement (PROSource TF20)  60 mL Per Tube BID   fentaNYL (SUBLIMAZE) injection  50 mcg Intravenous Once    furosemide  80 mg Intravenous BID   insulin aspart  2-6 Units Subcutaneous Q4H   lidocaine  1 patch Transdermal Q24H   mupirocin ointment  1 Application Nasal BID   mouth rinse  15 mL Mouth Rinse Q2H   polyethylene glycol  17 g Oral BID   potassium chloride  40 mEq Oral BID   rosuvastatin  20 mg Oral Daily   sodium chloride flush  3 mL Intravenous Q12H   sodium chloride flush  3 mL Intravenous Q12H   ticagrelor  90 mg Per Tube BID    Infusions:  sodium chloride     sodium chloride Stopped (02/04/23 1406)   sodium chloride     sodium chloride     sodium chloride     albumin human Stopped (02/03/23 1032)   amiodarone 60 mg/hr (02/06/23 0900)   ceFEPime (MAXIPIME) IV 200 mL/hr at 02/06/23 0900   dexmedetomidine (PRECEDEX) IV infusion 0.5 mcg/kg/hr (02/06/23 0900)   feeding supplement (VITAL AF 1.2 CAL) 55 mL/hr at 02/06/23 0900   fentaNYL infusion INTRAVENOUS 175 mcg/hr (02/06/23 0900)   heparin 1,200 Units/hr (02/06/23 0900)   midazolam 3 mg/hr (02/06/23 0900)   milrinone 0.3 mcg/kg/min (02/06/23 0900)   norepinephrine (LEVOPHED) Adult infusion 7 mcg/min (02/06/23 0900)   sodium chloride     vancomycin Stopped (02/05/23 2225)    PRN Medications: sodium chloride, sodium chloride, Place/Maintain arterial line **AND** sodium chloride, sodium chloride, acetaminophen, albumin human, alum & mag hydroxide-simeth, bisacodyl, calcium carbonate, fentaNYL, lidocaine, midazolam, morphine injection, nitroGLYCERIN, ondansetron (ZOFRAN) IV, mouth rinse, oxyCODONE, sodium chloride flush, sodium chloride flush    Patient Profile    67 y.o. female with history of CAD s/p PCI to RCA in 2021, carotid artery stenosis s/p R CEA, aortic valve stenosis. Admitted with unstable angina. Found to have severe LAD stenosis s/p PCI and severe low flow low gradient AS. Coarse c/b refractory VT and hemodynamic instability >> cardiogenic shock.    Assessment/Plan   Acute systolic CHF >> cardiogenic  shock -In setting of severe ischemic heart disease, reduced EF, severe AS and refractory VT/VF -Echo 01/25/23: EF 30-35%, septum and apex HK, RV low normal, low flow low graident severe AS with mean gradient 20 mmHg, AVA 0.93 cm2, DI 0.33 -echo 02/01/23: EF 20-25%, RV okay, severe low flow low gradient severe AS with mean gradient 25 mmHg, AVA 1.1 cm2 -s/p PCI/stent to LAD. Stent thrombosis on 08/09 treated with thrombectomy.  -IABP placed 08/13. Had considered [facility] ECMO but hemodynamics improved with inotrope support -S/po TAVR 8/15 - Echo 8/16 with  EF 35-40%, mild LVH, WMAs, RV normal, bioprosthetic aortic valve s/p TAVR with mean gradient 13 mmH, trivial PVL, IVC mildly dilated.  - IABP at 1:1. Continues on 0.375 milrinone + NE 7. CI 2.79. Co-ox 76%.  Hemodynamics look good this morning, will try to wean off IABP today.  Will repeat co-ox and thermo CO when on 1:3.  - CVP 12-13, good diuresis yesterday.  Will give Lasix 80 mg IV bid today and replace K.   2. Refractory VT -S/p multiple shocks.  -No further VT overnight, off lidocaine now.  - Continue amiodarone gtt.    3. CAD NSTEMI -HX PCI to RCA in 2021 -Presented with unstable angina. S/p PCI/stent to LAD on 08/07. Stent thrombosis 08/09 treated with thrombectomy -HS troponin > 24K on 08/12 -LHC 08/12 with patent LAD stent -LAD appeared widely patent on TAVR angiogram -Continue DAPT and statin.    4. LFLG Severe AS -Noted on echo this admit -Not candidate for SAVR -s/p TAVR 8/15, valve stable on 8/15 echo with trivial PVL and low gradient.   5. Carotid artery stenosis -S/p R CEA in 2020  6. ID -Developed sepsis 08/13. Now on cefepime + vancomycin -Fever resolved.  -Enterobacter cloacae in trach aspirate.   7. Respiratory failure -Vent management per CCM  8. Hyponatremia -Hypervolemic -Na 130 -diuresis per above    CRITICAL CARE Performed by: Marca Ancona  Total critical care time: 45 minutes  Critical care time  was exclusive of separately billable procedures and treating other patients.  Critical care was necessary to treat or prevent imminent or life-threatening deterioration.  Critical care was time spent personally by me (independent of midlevel providers or residents) on the following activities: development of treatment plan with patient and/or surrogate as well as nursing, discussions with consultants, evaluation of patient's response to treatment, examination of patient, obtaining history from patient or surrogate, ordering and performing treatments and interventions, ordering and review of laboratory studies, ordering and review of radiographic studies, pulse oximetry and re-evaluation of patient's condition.  Marca Ancona, MD  9:58 AM

## 2023-02-06 NOTE — Progress Notes (Signed)
NAME:  Madison West, MRN:  [id number], DOB:  [birth year], LOS: 13 ADMISSION DATE:  01/23/2023, CONSULTATION DATE:  02/06/2023  REFERRING MD:  TRH, pokhrel, CHIEF COMPLAINT:  VT arrest   History of Present Illness:  67 yo F w/ pertinent PMH PSVT, HTN, HLD, R carotid artery stenosis s/p endarterectomy 2020, CAD s/p stenting 2021, moderate aortic stenosis presents to [facility] ED on 8/9 w/ SOB.   Patient has been having SOB that has been getting progressively worse. Patient states she has been gaining weight and feels fatigued. Patient also having some anginal chest pain similar to when she needed coronary interventions back in 2021. Patient admitted to [facility] on 8/9 and treated for new onset CHF. Echo showing LVEF 30-35%; regional wall motion abnormalities; moderate aortic stenosis. BNP 462. EKG showing PVC, LAE, LAFB w/ miniaml ST elevation. Patient underwent cardiac cath on 8.7 showing mid LAD 95% lesion w/ LAD stent placement. CT surgery consulted for severe aortic stenosis. Patient has been having episodes of non sustained v tach started on metoprolol.    On 8/11, patient on floors and had vtach w/ no pulse. CPR initiated, given epi, and shocked x2 with ROSC achieved. Patient alert after cpr event but slightly confused. Patient again went into Vtach rhythm and was shocked again. Given amio, mag, calcium. Patient started on amio drip. Patient transferred to [facility] ICU and cardiology consulted. EKG showing ST elevation in anterior leads. Code STEMI. PCCM consulted.  Pertinent  Medical History  PSVT, anxiety/depression,  breast cancer status post lumpectomy, chemo and radiation in 2009, hypertension,  hyperlipidemia,  right carotid artery stenosis status post carotid endarterectomy in 2020, CAD status post stent to LAD 8/7  Significant Hospital Events: Including procedures, antibiotic start and stop dates in addition to other pertinent events   01/31/2023 for left heart catheterization intervention on the  mid LAD. 8/13 overnight patient developed refractory VT shocked multiple times taken back to Cath Lab, intra-aortic balloon pump placed.  Stabilized intolerant of inotropes due to recurrent VT on amiodarone lidocaine and Neo-Synephrine. Was taken for consideration of VAECMO and then the decision was to not cannulate 8/15 plans for TAVR today  8/16 doing better, weaning pressors, remains intubated, IABP in place 8/17 remains on IABP  Interim History / Subjective:   Intubated, on life support   Objective   Blood pressure 108/79, pulse (!) 58, temperature 98.1 F (36.7 C), resp. rate 20, height 5\' 8"  (1.727 m), weight 104.8 kg, SpO2 96%. PAP: (19-40)/(14-35) 28/20 CVP:  [6 mmHg-19 mmHg] 8 mmHg PCWP:  [19 mmHg-20 mmHg] 19 mmHg CO:  [3.9 L/min-5.9 L/min] 4.5 L/min CI:  [2 L/min/m2-3.1 L/min/m2] 2.4 L/min/m2  Vent Mode: PRVC FiO2 (%):  [50 %] 50 % Set Rate:  [20 bmp] 20 bmp Vt Set:  [560 mL] 560 mL PEEP:  [5 cmH20] 5 cmH20 Plateau Pressure:  [17 cmH20-19 cmH20] 18 cmH20   Intake/Output Summary (Last 24 hours) at 02/06/2023 0830 Last data filed at 02/06/2023 0800 Gross per 24 hour  Intake 4268.71 ml  Output 5395 ml  Net -1126.29 ml   Filed Weights   02/04/23 0500 02/05/23 0124 02/06/23 0309  Weight: 104.6 kg 105.1 kg 104.8 kg    Examination: Gen: Elderly female intubated on mechanical life support critically ill ENT NCAT tracking appropriately is awake even on sedation will follow commands Neck: Endotracheal tube in place, right IJ Swan Lungs: Bilateral mechanically ventilated breath sounds Cardiovascular: Regular rhythm, S1-S2 Abdomen: Soft, nontender nondistended Musculoskeletal: No significant edema  Neuro: Alert following commands  Presenting: EKG shows ST elevations V1-V3 Rhythm on monitor initially showed runs of VT but then settled down into sinus tachycardia, bigeminy rhythm  Labs show mild leukocytosis, stable hemoglobin  Resolved Hospital Problem list      Assessment & Plan:   Refractory VT, cardiogenic shock VT arrest in the setting of recent LAD stent, EKG showing anterior wall STEMI, likely in-stent restenosis Acute coronary syndrome, taken to the Cath Lab with successful percutaneous intervention on the mid LAD Plan: Now status post TAVR Continue diuresis Has intra-aortic balloon pump still in place. Will speak with advanced heart failure service. I would like to see the balloon pump removed as well as her diuresed to euvolemia before consideration of extubation. She has significant anxiety and I think any struggling or difficulty breathing will make this worse and I want to ensure that we are able to optimize her the best without needing any additional BiPAP support postextubation.  Fevers Tracheal aspirate with Enterobacter cloaca Plan: Continue broad spectrum ABX  Pending speciation and sensitivities will de-escalate antibiotics  Chest wall pain, s/p CPR  P: Currently on continuous fentanyl  Acute systolic heart failure -EF 30-35% with akinetic apex and septum on echocardiogram   Severe AS - s/p TAVR   Acute hypoxic respiratory failure, resolved   Hypertension -holding losartan and amlodipine  Severe anxiety -on BuSpar and Xanax -Currently on Versed  Hyperglycemia  P: Cbgs with ssi    Best Practice (right click and "Reselect all SmartList Selections" daily)   Diet/type: NPO DVT prophylaxis: systemic heparin GI prophylaxis: N/A Lines: N/A Foley:  N/A Code Status:  full code Last date of multidisciplinary goals of care discussion: continue to update daughters   Labs   CBC: Recent Labs  Lab 02/02/23 1725 02/02/23 1730 02/03/23 0025 02/03/23 0749 02/04/23 0207 02/05/23 0417 02/06/23 0512  WBC 14.6*  --  13.2*  --  8.8 11.0* 9.5  HGB 11.4*   < > 11.3* 10.5* 9.9* 9.0* 8.8*  HCT 34.4*   < > 32.6* 31.0* 28.6* 26.8* 25.9*  MCV 93.2  --  94.5  --  92.6 93.4 92.2  PLT 378  --  356  --  270 226 226    < > = values in this interval not displayed.    Basic Metabolic Panel: Recent Labs  Lab 02/02/23 0603 02/02/23 0626 02/03/23 1425 02/03/23 1603 02/04/23 0207 02/04/23 1940 02/05/23 0417 02/05/23 1948 02/06/23 0512  NA 123*   < >  --    < > 125* 125* 126* 128* 130*  K 4.0   < >  --    < > 3.5 4.0 3.9 3.9 3.4*  CL 94*   < >  --    < > 97* 98 99 98 98  CO2 22   < >  --    < > 20* 19* 20* 21* 22  GLUCOSE 147*   < >  --    < > 136* 141* 127* 132* 136*  BUN 14   < >  --    < > 14 17 18 20  25*  CREATININE 0.73   < >  --    < > 0.74 0.65 0.75 0.72 0.73  CALCIUM 8.7*   < >  --    < > 7.5* 7.2* 7.5* 7.4* 7.6*  MG 2.7*   < > 2.1  --  2.0 2.3 2.1  --  1.9  PHOS 3.7  --  2.4*  --  2.3* 2.9  --   --   --    < > = values in this interval not displayed.   GFR: Estimated Creatinine Clearance: 84.1 mL/min (by C-G formula based on SCr of 0.73 mg/dL). Recent Labs  Lab 02/02/23 0559 02/02/23 0603 02/02/23 0818 02/02/23 1725 02/02/23 1732 02/03/23 0025 02/03/23 1950 02/03/23 0755 02/04/23 0207 02/05/23 0417 02/06/23 0512  PROCALCITON  --   --   --   --   --   --  0.24  --   --   --   --   WBC  --    < >  --    < >  --  13.2*  --   --  8.8 11.0* 9.5  LATICACIDVEN 1.4  --  0.8  --  0.5  --   --  0.4*  --   --   --    < > = values in this interval not displayed.    Liver Function Tests: Recent Labs  Lab 02/02/23 0252  AST 180*  ALT 90*  ALKPHOS 39  BILITOT 0.6  PROT 5.4*  ALBUMIN 3.1*   No results for input(s): "LIPASE", "AMYLASE" in the last 168 hours. No results for input(s): "AMMONIA" in the last 168 hours.  ABG    Component Value Date/Time   PHART 7.295 (L) 02/03/2023 0749   PCO2ART 47.1 02/03/2023 0749   PO2ART 112 (H) 02/03/2023 0749   HCO3 22.6 02/03/2023 0749   TCO2 24 02/03/2023 0749   ACIDBASEDEF 4.0 (H) 02/03/2023 0749   O2SAT 76.4 02/06/2023 0512     Coagulation Profile: No results for input(s): "INR", "PROTIME" in the last 168 hours.  Cardiac  Enzymes: No results for input(s): "CKTOTAL", "CKMB", "CKMBINDEX", "TROPONINI" in the last 168 hours.  HbA1C: Hgb A1c MFr Bld  Date/Time Value Ref Range Status  02/01/2023 05:29 AM 5.6 4.8 - 5.6 % Final    Comment:    (NOTE)         Prediabetes: 5.7 - 6.4         Diabetes: >6.4         Glycemic control for adults with diabetes: <7.0   10/15/2017 04:24 PM 5.8 (H) 4.8 - 5.6 % Final    Comment:             Prediabetes: 5.7 - 6.4          Diabetes: >6.4          Glycemic control for adults with diabetes: <7.0     CBG: Recent Labs  Lab 02/05/23 1550 02/05/23 1951 02/05/23 2255 02/06/23 0341 02/06/23 0821  GLUCAP 132* 125* 129* 135* 135*    This patient is critically ill with multiple organ system failure; which, requires frequent high complexity decision making, assessment, support, evaluation, and titration of therapies. This was completed through the application of advanced monitoring technologies and extensive interpretation of multiple databases. During this encounter critical care time was devoted to patient care services described in this note for 32 minutes.   Josephine Igo, DO [facility] 02/06/2023 8:30 AM

## 2023-02-06 NOTE — Progress Notes (Signed)
PT Cancellation Note  Patient Details Name: Madison West MRN: [id number] DOB: 08-03-1955   Cancelled Treatment:    Reason Eval/Treat Not Completed: Other (comment) Per RN plan is to pull pt's IABP this date and start weaning off the vent. PT to hold today and check back tomorrow to evaluated pt's readiness to participate in therapy eval.  Lewis Shock, PT, DPT Acute Rehabilitation Services Secure chat preferred Office #: [phone]    Iona Hansen 02/06/2023, 10:13 AM

## 2023-02-06 NOTE — Plan of Care (Signed)
  Problem: Education: Goal: Understanding of CV disease, CV risk reduction, and recovery process will improve Outcome: Progressing   Problem: Cardiovascular: Goal: Ability to achieve and maintain adequate cardiovascular perfusion will improve Outcome: Progressing   Problem: Cardiac: Goal: Ability to achieve and maintain adequate cardiopulmonary perfusion will improve Outcome: Progressing

## 2023-02-07 DIAGNOSIS — R570 Cardiogenic shock: Secondary | ICD-10-CM | POA: Diagnosis not present

## 2023-02-07 LAB — BASIC METABOLIC PANEL
Anion gap: 10 (ref 5–15)
Anion gap: 10 (ref 5–15)
BUN: 23 mg/dL (ref 8–23)
BUN: 25 mg/dL — ABNORMAL HIGH (ref 8–23)
CO2: 24 mmol/L (ref 22–32)
CO2: 25 mmol/L (ref 22–32)
Calcium: 7.9 mg/dL — ABNORMAL LOW (ref 8.9–10.3)
Calcium: 8.2 mg/dL — ABNORMAL LOW (ref 8.9–10.3)
Chloride: 97 mmol/L — ABNORMAL LOW (ref 98–111)
Chloride: 98 mmol/L (ref 98–111)
Creatinine, Ser: 0.71 mg/dL (ref 0.44–1.00)
Creatinine, Ser: 0.75 mg/dL (ref 0.44–1.00)
GFR, Estimated: >60 mL/min (ref >60)
GFR, Estimated: >60 mL/min (ref >60)
Glucose, Bld: 141 mg/dL — ABNORMAL HIGH (ref 70–99)
Glucose, Bld: 118 mg/dL — ABNORMAL HIGH (ref 70–99)
Potassium: 3.6 mmol/L (ref 3.5–5.1)
Potassium: 3.9 mmol/L (ref 3.5–5.1)
Sodium: 131 mmol/L — ABNORMAL LOW (ref 135–145)
Sodium: 133 mmol/L — ABNORMAL LOW (ref 135–145)

## 2023-02-07 LAB — CBC
HCT: 26.6 % — ABNORMAL LOW (ref 36.0–46.0)
Hemoglobin: 9 g/dL — ABNORMAL LOW (ref 12.0–15.0)
MCH: 31.6 pg (ref 26.0–34.0)
MCHC: 33.8 g/dL (ref 30.0–36.0)
MCV: 93.3 fL (ref 80.0–100.0)
Platelets: 260 10*3/uL (ref 150–400)
RBC: 2.85 MIL/uL — ABNORMAL LOW (ref 3.87–5.11)
RDW: 13.8 % (ref 11.5–15.5)
WBC: 8.2 10*3/uL (ref 4.0–10.5)
nRBC: 0 % (ref 0.0–0.2)

## 2023-02-07 LAB — GLUCOSE, CAPILLARY
Glucose-Capillary: 112 mg/dL — ABNORMAL HIGH (ref 70–99)
Glucose-Capillary: 120 mg/dL — ABNORMAL HIGH (ref 70–99)
Glucose-Capillary: 121 mg/dL — ABNORMAL HIGH (ref 70–99)
Glucose-Capillary: 123 mg/dL — ABNORMAL HIGH (ref 70–99)
Glucose-Capillary: 131 mg/dL — ABNORMAL HIGH (ref 70–99)
Glucose-Capillary: 147 mg/dL — ABNORMAL HIGH (ref 70–99)
Glucose-Capillary: 147 mg/dL — ABNORMAL HIGH (ref 70–99)

## 2023-02-07 LAB — COOXEMETRY PANEL
Carboxyhemoglobin: 1.3 % (ref 0.5–1.5)
Methemoglobin: <0.7 % (ref 0.0–1.5)
O2 Saturation: 71.4 %
Total hemoglobin: 9.3 g/dL — ABNORMAL LOW (ref 12.0–16.0)

## 2023-02-07 LAB — MAGNESIUM: Magnesium: 2 mg/dL (ref 1.7–2.4)

## 2023-02-07 MED ORDER — ORAL CARE MOUTH RINSE: 15.0000 mL | OROMUCOSAL | Status: DC | PRN

## 2023-02-07 MED ORDER — POTASSIUM CHLORIDE 20 MEQ PO PACK: 40.0000 meq | PACK | Freq: Once | ORAL | Status: DC

## 2023-02-07 MED ORDER — ALPRAZOLAM 0.5 MG PO TABS: 1.0000 mg | ORAL_TABLET | Freq: Four times a day (QID) | ORAL | Status: DC

## 2023-02-07 MED ORDER — POLYETHYLENE GLYCOL 3350 17 G PO PACK
17.0000 g | PACK | Freq: Two times a day (BID) | ORAL | Status: DC
  Administered 2023-02-08 – 2023-02-15 (×7): 17 g via ORAL
  Filled 2023-02-07 (×11): qty 1

## 2023-02-07 MED ORDER — FAMOTIDINE 20 MG PO TABS
20.0000 mg | ORAL_TABLET | Freq: Two times a day (BID) | ORAL | Status: DC
  Administered 2023-02-08 – 2023-02-14 (×12): 20 mg via ORAL
  Filled 2023-02-07 (×12): qty 1

## 2023-02-07 MED ORDER — LIDOCAINE 5 % EX PTCH
1.0000 | MEDICATED_PATCH | Freq: Two times a day (BID) | CUTANEOUS | Status: DC | PRN
  Administered 2023-02-07 – 2023-02-13 (×6): 1 via TRANSDERMAL
  Filled 2023-02-07 (×6): qty 1

## 2023-02-07 MED ORDER — BUSPIRONE HCL 10 MG PO TABS: 5.0000 mg | ORAL_TABLET | Freq: Two times a day (BID) | ORAL | Status: DC

## 2023-02-07 MED ORDER — FUROSEMIDE 10 MG/ML IJ SOLN
10.0000 mg/h | INTRAVENOUS | Status: DC
  Administered 2023-02-07 – 2023-02-08 (×2): 10 mg/h via INTRAVENOUS
  Filled 2023-02-07 (×2): qty 20

## 2023-02-07 MED ORDER — ROSUVASTATIN CALCIUM 20 MG PO TABS
20.0000 mg | ORAL_TABLET | Freq: Every day | ORAL | Status: DC
  Administered 2023-02-08 – 2023-02-18 (×11): 20 mg via ORAL
  Filled 2023-02-07 (×11): qty 1

## 2023-02-07 MED ORDER — HEPARIN SODIUM (PORCINE) 5000 UNIT/ML IJ SOLN
5000.0000 [IU] | Freq: Three times a day (TID) | INTRAMUSCULAR | Status: DC
  Administered 2023-02-07 – 2023-02-10 (×9): 5000 [IU] via SUBCUTANEOUS
  Filled 2023-02-07 (×7): qty 1

## 2023-02-07 MED ORDER — ORAL CARE MOUTH RINSE
15.0000 mL | OROMUCOSAL | Status: DC
  Administered 2023-02-07 – 2023-02-13 (×21): 15 mL via OROMUCOSAL

## 2023-02-07 MED ORDER — LORAZEPAM 2 MG/ML IJ SOLN
1.0000 mg | Freq: Four times a day (QID) | INTRAMUSCULAR | Status: DC | PRN
  Administered 2023-02-08: 1 mg via INTRAVENOUS
  Filled 2023-02-07 (×2): qty 1

## 2023-02-07 MED ORDER — DOCUSATE SODIUM 50 MG/5ML PO LIQD
100.0000 mg | Freq: Two times a day (BID) | ORAL | Status: DC
  Administered 2023-02-09 – 2023-02-11 (×6): 100 mg via ORAL
  Filled 2023-02-07 (×6): qty 10

## 2023-02-07 MED ORDER — FUROSEMIDE 10 MG/ML IJ SOLN
80.0000 mg | Freq: Once | INTRAMUSCULAR | Status: AC
  Administered 2023-02-07: 80 mg via INTRAVENOUS
  Filled 2023-02-07: qty 8

## 2023-02-07 MED ORDER — ASPIRIN 81 MG PO CHEW
81.0000 mg | CHEWABLE_TABLET | Freq: Every day | ORAL | Status: DC
  Administered 2023-02-08 – 2023-02-18 (×11): 81 mg via ORAL
  Filled 2023-02-07 (×11): qty 1

## 2023-02-07 MED ORDER — POTASSIUM CHLORIDE 10 MEQ/100ML IV SOLN: 10.0000 meq | INTRAVENOUS | Status: DC

## 2023-02-07 MED ORDER — POTASSIUM CHLORIDE 20 MEQ PO PACK
40.0000 meq | PACK | Freq: Once | ORAL | Status: AC
  Administered 2023-02-07: 40 meq
  Filled 2023-02-07: qty 2

## 2023-02-07 MED ORDER — POTASSIUM CHLORIDE 10 MEQ/50ML IV SOLN
10.0000 meq | INTRAVENOUS | Status: AC
  Administered 2023-02-07 (×4): 10 meq via INTRAVENOUS
  Filled 2023-02-07: qty 50

## 2023-02-07 MED ORDER — TICAGRELOR 90 MG PO TABS
90.0000 mg | ORAL_TABLET | Freq: Two times a day (BID) | ORAL | Status: DC
  Administered 2023-02-08 – 2023-02-10 (×5): 90 mg via ORAL
  Filled 2023-02-07 (×5): qty 1

## 2023-02-07 MED ORDER — HYDRALAZINE HCL 20 MG/ML IJ SOLN
10.0000 mg | INTRAMUSCULAR | Status: DC | PRN
  Administered 2023-02-07 – 2023-02-11 (×6): 10 mg via INTRAVENOUS
  Filled 2023-02-07 (×6): qty 1

## 2023-02-07 NOTE — Progress Notes (Addendum)
Inpatient Rehab Admissions Coordinator:   Per therapy recommendations, patient was screened for CIR candidacy by Megan Salon, MS, CCC-SLP. At this time, Pt. is not yet transferring OOB and not at a level to tolerate the intensity of CIR.   Pt. may have potential to progress to becoming a potential CIR candidate, so CIR admissions team will follow and monitor for progress and participation with therapies and place consult order if Pt. appears to be an appropriate candidate. Please contact me with any questions.   Megan Salon, MS, CCC-SLP Rehab Admissions Coordinator  [phone] (celll) [phone] (office)

## 2023-02-07 NOTE — Progress Notes (Signed)
Patient ID: Madison West, female   DOB: March 07, 1956, 67 y.o.   MRN: [id number]     Advanced Heart Failure Rounding Note  PCP-Cardiologist: None   Subjective:    - 8/15 s/p TAVR. Coronary angiogram w/ widely patent stent. Post op echo w/ no paravalvular leak. - Echo (8/16): EF 35-40%, mild LVH, WMAs, RV normal, bioprosthetic aortic valve s/p TAVR with mean gradient 13 mmH, trivial PVL, IVC mildly dilated.  - IABP removed 8/17  On milrinone 0.375 + NE 6.  SBP 130s-140s.  Diuresed yesterday but I/Os even due to significant volume in.  Weight down.  Creatinine stable 0.71.   Ongoing SBT this morning.   SWAN #s CVP 16 PA 34/26 CI 2.9 Co-ox 71%  No further VT on amiodarone gtt.   Afebrile.  PNA, Enterobacter cloacae on trach aspirate culture.  Remains on Vancomycin + Cefepime.    Objective:   Weight Range: 103.1 kg Body mass index is 34.56 kg/m.   Vital Signs:   Temp:  [96.8 F (36 C)-98.2 F (36.8 C)] 97.9 F (36.6 C) (08/18 0800) Pulse Rate:  [54-77] 66 (08/18 0800) Resp:  [18-33] 29 (08/18 0800) SpO2:  [93 %-98 %] 95 % (08/18 0800) Arterial Line BP: (98-210)/(35-76) 124/48 (08/18 0800) FiO2 (%):  [50 %] 50 % (08/18 0812) Weight:  [103.1 kg] 103.1 kg (08/18 0500) Last BM Date : 02/01/23  Weight change: Filed Weights   02/05/23 0124 02/06/23 0309 02/07/23 0500  Weight: 105.1 kg 104.8 kg 103.1 kg    Intake/Output:   Intake/Output Summary (Last 24 hours) at 02/07/2023 0915 Last data filed at 02/07/2023 0800 Gross per 24 hour  Intake 4193.57 ml  Output 4380 ml  Net -186.43 ml      Physical Exam    CVP 16 General: NAD, awake on vent Neck: JVP 16 cm, no thyromegaly or thyroid nodule.  Lungs: Rhonchi bilaterally. CV: Nondisplaced PMI.  Heart regular S1/S2, no S3/S4, no murmur. 1+ ankle edema.  Abdomen: Soft, nontender, no hepatosplenomegaly, no distention.  Skin: Intact without lesions or rashes.  Neurologic: Awake, follows commands Extremities: No  clubbing or cyanosis.  HEENT: Normal.    Telemetry   NSR. No further VT.  Personally reviewed.   Labs    CBC Recent Labs    02/06/23 0512 02/07/23 0503  WBC 9.5 8.2  HGB 8.8* 9.0*  HCT 25.9* 26.6*  MCV 92.2 93.3  PLT 226 260   Basic Metabolic Panel Recent Labs    95/28/41 1940 02/05/23 0417 02/06/23 0512 02/07/23 0503  NA 125*   < > 130* 131*  K 4.0   < > 3.4* 3.6  CL 98   < > 98 97*  CO2 19*   < > 22 24  GLUCOSE 141*   < > 136* 141*  BUN 17   < > 25* 23  CREATININE 0.65   < > 0.73 0.71  CALCIUM 7.2*   < > 7.6* 7.9*  MG 2.3   < > 1.9 2.0  PHOS 2.9  --   --   --    < > = values in this interval not displayed.   Liver Function Tests No results for input(s): "AST", "ALT", "ALKPHOS", "BILITOT", "PROT", "ALBUMIN" in the last 72 hours.  No results for input(s): "LIPASE", "AMYLASE" in the last 72 hours. Cardiac Enzymes No results for input(s): "CKTOTAL", "CKMB", "CKMBINDEX", "TROPONINI" in the last 72 hours.  BNP: BNP (last 3 results) Recent Labs    01/23/23 2208  BNP 462.3*    ProBNP (last 3 results) No results for input(s): "PROBNP" in the last 8760 hours.   D-Dimer No results for input(s): "DDIMER" in the last 72 hours. Hemoglobin A1C No results for input(s): "HGBA1C" in the last 72 hours.  Fasting Lipid Panel No results for input(s): "CHOL", "HDL", "LDLCALC", "TRIG", "CHOLHDL", "LDLDIRECT" in the last 72 hours.  Thyroid Function Tests No results for input(s): "TSH", "T4TOTAL", "T3FREE", "THYROIDAB" in the last 72 hours.  Invalid input(s): "FREET3"  Other results:   Imaging    No results found.   Medications:     Scheduled Medications:  ALPRAZolam  1 mg Per Tube Q6H   aspirin  81 mg Per Tube Daily   busPIRone  5 mg Per Tube BID   Chlorhexidine Gluconate Cloth  6 each Topical Daily   docusate  100 mg Per Tube BID   famotidine  20 mg Per Tube BID   feeding supplement (PROSource TF20)  60 mL Per Tube BID   fentaNYL (SUBLIMAZE)  injection  50 mcg Intravenous Once   furosemide  80 mg Intravenous Once   insulin aspart  2-6 Units Subcutaneous Q4H   lidocaine  1 patch Transdermal Q24H   mupirocin ointment  1 Application Nasal BID   mouth rinse  15 mL Mouth Rinse Q2H   polyethylene glycol  17 g Per Tube BID   potassium chloride  40 mEq Per Tube Once   rosuvastatin  20 mg Per Tube Daily   sodium chloride flush  3 mL Intravenous Q12H   sodium chloride flush  3 mL Intravenous Q12H   ticagrelor  90 mg Per Tube BID    Infusions:  sodium chloride     sodium chloride Stopped (02/04/23 1406)   sodium chloride     sodium chloride     sodium chloride     albumin human Stopped (02/03/23 1032)   amiodarone 60 mg/hr (02/07/23 0800)   ceFEPime (MAXIPIME) IV 2 g (02/07/23 0859)   dexmedetomidine (PRECEDEX) IV infusion 1.2 mcg/kg/hr (02/07/23 0809)   feeding supplement (VITAL AF 1.2 CAL) 55 mL/hr at 02/07/23 0800   fentaNYL infusion INTRAVENOUS Stopped (02/07/23 0733)   furosemide (LASIX) 200 mg in dextrose 5 % 100 mL (2 mg/mL) infusion     midazolam 1 mg/hr (02/07/23 0800)   milrinone 0.375 mcg/kg/min (02/07/23 0831)   norepinephrine (LEVOPHED) Adult infusion Stopped (02/07/23 0745)   sodium chloride     vancomycin Stopped (02/06/23 2257)    PRN Medications: sodium chloride, sodium chloride, Place/Maintain arterial line **AND** sodium chloride, sodium chloride, acetaminophen, albumin human, alum & mag hydroxide-simeth, bisacodyl, calcium carbonate, fentaNYL, lidocaine, midazolam, morphine injection, nitroGLYCERIN, ondansetron (ZOFRAN) IV, mouth rinse, oxyCODONE, sodium chloride flush, sodium chloride flush    Patient Profile    67 y.o. female with history of CAD s/p PCI to RCA in 2021, carotid artery stenosis s/p R CEA, aortic valve stenosis. Admitted with unstable angina. Found to have severe LAD stenosis s/p PCI and severe low flow low gradient AS. Coarse c/b refractory VT and hemodynamic instability >> cardiogenic  shock.    Assessment/Plan   Acute systolic CHF >> cardiogenic shock - In setting of severe ischemic heart disease, reduced EF, severe AS and refractory VT/VF - Echo 01/25/23: EF 30-35%, septum and apex HK, RV low normal, low flow low graident severe AS with mean gradient 20 mmHg, AVA 0.93 cm2, DI 0.33 - echo 02/01/23: EF 20-25%, RV okay, severe low flow low gradient severe AS with mean  gradient 25 mmHg, AVA 1.1 cm2 - s/p PCI/stent to LAD. Stent thrombosis on 08/09 treated with thrombectomy.  - IABP placed 08/13. Had considered [facility] ECMO but hemodynamics improved with inotrope support - S/p TAVR 8/15 - Echo 8/16 with EF 35-40%, mild LVH, WMAs, RV normal, bioprosthetic aortic valve s/p TAVR with mean gradient 13 mmH, trivial PVL, IVC mildly dilated.  - IABP removed 8/17 - Continues on 0.375 milrinone + NE 6. CI 2.71. Co-ox 71%.  BP stable, wean down on NE today, continue current milrinone.  - Remains volume overloaded with CVP 16, Lasix 80 mg IV x 1 this morning then start Lasix gtt 10 mg/hr.  Replace K and repeat BMET in pm.   2. Refractory VT - S/p multiple shocks.  - No further VT over the last day  - Continue amiodarone gtt, can decrease to 30 mg/hr.  - Replace K/Mg.    3. CAD NSTEMI -HX PCI to RCA in 2021 -Presented with unstable angina. S/p PCI/stent to LAD on 08/07. Stent thrombosis 08/09 treated with thrombectomy -HS troponin > 24K on 08/12 -LHC 08/12 with patent LAD stent -LAD appeared widely patent on TAVR angiogram -Continue DAPT and statin.    4. LFLG Severe AS -Noted on echo this admit -Not candidate for SAVR -s/p TAVR 8/15, valve stable on 8/15 echo with trivial PVL and low gradient.   5. Carotid artery stenosis -S/p R CEA in 2020  6. ID -Developed sepsis 08/13. Now on cefepime + vancomycin -Fever resolved.  -Enterobacter cloacae in trach aspirate.   7. Respiratory failure -Vent management per CCM => SBT this morning, hopefully extubate.   8.  Hyponatremia -Hypervolemic -Na 131 -diuresis per above    CRITICAL CARE Performed by: Marca Ancona  Total critical care time: 45 minutes  Critical care time was exclusive of separately billable procedures and treating other patients.  Critical care was necessary to treat or prevent imminent or life-threatening deterioration.  Critical care was time spent personally by me (independent of midlevel providers or residents) on the following activities: development of treatment plan with patient and/or surrogate as well as nursing, discussions with consultants, evaluation of patient's response to treatment, examination of patient, obtaining history from patient or surrogate, ordering and performing treatments and interventions, ordering and review of laboratory studies, ordering and review of radiographic studies, pulse oximetry and re-evaluation of patient's condition.  Marca Ancona, MD  9:15 AM

## 2023-02-07 NOTE — Progress Notes (Signed)
Since she is NPO right ok, ok to change Xanax to IV lorazepam 1mg  q6h PRN per Dr. Tonia Brooms.  Ulyses Southward, PharmD, BCIDP, AAHIVP, CPP Infectious Disease Pharmacist 02/07/2023 6:27 PM

## 2023-02-07 NOTE — Procedures (Signed)
Extubation Procedure Note  Patient Details:   Name: Madison West DOB: 1956/01/10 MRN: [id number]   Airway Documentation:    Vent end date: 02/07/23 Vent end time: 1025   Evaluation  O2 sats: stable throughout Complications: No apparent complications Patient did tolerate procedure well. Bilateral Breath Sounds: Rhonchi, Diminished   Yes, pt could speak post extubation.  Pt extubated to HFNC.    Audrie Lia 02/07/2023, 10:33 AM

## 2023-02-07 NOTE — Progress Notes (Signed)
NAME:  Madison West, MRN:  [id number], DOB:  October 05, 1955, LOS: 14 ADMISSION DATE:  01/23/2023, CONSULTATION DATE:  02/07/2023  REFERRING MD:  TRH, pokhrel, CHIEF COMPLAINT:  VT arrest   History of Present Illness:  67 yo F w/ pertinent PMH PSVT, HTN, HLD, R carotid artery stenosis s/p endarterectomy 2020, CAD s/p stenting 2021, moderate aortic stenosis presents to [facility] ED on 8/9 w/ SOB.   Patient has been having SOB that has been getting progressively worse. Patient states she has been gaining weight and feels fatigued. Patient also having some anginal chest pain similar to when she needed coronary interventions back in 2021. Patient admitted to [facility] on 8/9 and treated for new onset CHF. Echo showing LVEF 30-35%; regional wall motion abnormalities; moderate aortic stenosis. BNP 462. EKG showing PVC, LAE, LAFB w/ miniaml ST elevation. Patient underwent cardiac cath on 8.7 showing mid LAD 95% lesion w/ LAD stent placement. CT surgery consulted for severe aortic stenosis. Patient has been having episodes of non sustained v tach started on metoprolol.    On 8/11, patient on floors and had vtach w/ no pulse. CPR initiated, given epi, and shocked x2 with ROSC achieved. Patient alert after cpr event but slightly confused. Patient again went into Vtach rhythm and was shocked again. Given amio, mag, calcium. Patient started on amio drip. Patient transferred to [facility] ICU and cardiology consulted. EKG showing ST elevation in anterior leads. Code STEMI. PCCM consulted.  Pertinent  Medical History  PSVT, anxiety/depression,  breast cancer status post lumpectomy, chemo and radiation in 2009, hypertension,  hyperlipidemia,  right carotid artery stenosis status post carotid endarterectomy in 2020, CAD status post stent to LAD 8/7  Significant Hospital Events: Including procedures, antibiotic start and stop dates in addition to other pertinent events   01/31/2023 for left heart catheterization intervention on the  mid LAD. 8/13 overnight patient developed refractory VT shocked multiple times taken back to Cath Lab, intra-aortic balloon pump placed.  Stabilized intolerant of inotropes due to recurrent VT on amiodarone lidocaine and Neo-Synephrine. Was taken for consideration of VAECMO and then the decision was to not cannulate 8/15 plans for TAVR today  8/16 doing better, weaning pressors, remains intubated, IABP in place 8/17 remains on IABP 8/18 extubation   Interim History / Subjective:   SAT SBT This am. Diuresis continued by heart failure service   Objective   Blood pressure 108/79, pulse 74, temperature 98.2 F (36.8 C), resp. rate (!) 26, height 5\' 8"  (1.727 m), weight 103.1 kg, SpO2 92%. PAP: (25-46)/(18-42) 43/32 CVP:  [0 mmHg-24 mmHg] 17 mmHg PCWP:  [21 mmHg-25 mmHg] 25 mmHg CO:  [5.1 L/min-6.3 L/min] 5.5 L/min CI:  [2.66 L/min/m2-3.3 L/min/m2] 2.9 L/min/m2  Vent Mode: PSV;CPAP FiO2 (%):  [50 %] 50 % Set Rate:  [20 bmp] 20 bmp Vt Set:  [560 mL] 560 mL PEEP:  [5 cmH20] 5 cmH20 Pressure Support:  [10 cmH20] 10 cmH20 Plateau Pressure:  [17 cmH20-19 cmH20] 18 cmH20   Intake/Output Summary (Last 24 hours) at 02/07/2023 1031 Last data filed at 02/07/2023 0958 Gross per 24 hour  Intake 4110.15 ml  Output 4265 ml  Net -154.85 ml   Filed Weights   02/05/23 0124 02/06/23 0309 02/07/23 0500  Weight: 105.1 kg 104.8 kg 103.1 kg    Examination: Gen: elderly FM, intubated on life support. Following commands  ENT: ncat, tracking, following commands  Neck: ETT in place  Lungs: BL vented breaths  Cardiovascular: RRR, s1 s2  Abdomen:  soft, nt nd  Musculoskeletal: no edema  Neuro: alert following commands   Presenting: EKG shows ST elevations V1-V3 Rhythm on monitor initially showed runs of VT but then settled down into sinus tachycardia, bigeminy rhythm  Labs show mild leukocytosis, stable hemoglobin  Resolved Hospital Problem list     Assessment & Plan:   Refractory VT,  cardiogenic shock VT arrest in the setting of recent LAD stent, EKG showing anterior wall STEMI, likely in-stent restenosis Acute coronary syndrome, taken to the Cath Lab with successful percutaneous intervention on the mid LAD Plan: Still requiring pressors.  Continued diuresis, CVP remains elevated  Balloon pump removed  SAT SBT today Possible extubation   Fevers Tracheal aspirate with Enterobacter cloaca Plan: Pan sensititve  Stop vanco  Continue cefepime X 7 days then stop   Chest wall pain, s/p CPR  P: Prn pain meds once extubated   Acute systolic heart failure -EF 30-35% with akinetic apex and septum on echocardiogram  - GDMT per HF service   Severe AS - s/p TAVR   Acute hypoxic respiratory failure, resolved   Hypertension -holding losartan and amlodipine  Severe anxiety -on BuSpar and Xanax - extubate and plan to leave precedex on   Hyperglycemia  P: Cbgs with ssi    Best Practice (right click and "Reselect all SmartList Selections" daily)   Diet/type: NPO DVT prophylaxis: systemic heparin GI prophylaxis: N/A Lines: N/A Foley:  N/A Code Status:  full code Last date of multidisciplinary goals of care discussion: continue to update daughters   Labs   CBC: Recent Labs  Lab 02/03/23 0025 02/03/23 0749 02/04/23 0207 02/05/23 0417 02/06/23 0512 02/07/23 0503  WBC 13.2*  --  8.8 11.0* 9.5 8.2  HGB 11.3* 10.5* 9.9* 9.0* 8.8* 9.0*  HCT 32.6* 31.0* 28.6* 26.8* 25.9* 26.6*  MCV 94.5  --  92.6 93.4 92.2 93.3  PLT 356  --  270 226 226 260    Basic Metabolic Panel: Recent Labs  Lab 02/02/23 0603 02/02/23 0626 02/03/23 1425 02/03/23 1603 02/04/23 0207 02/04/23 1940 02/05/23 0417 02/05/23 1948 02/06/23 0512 02/07/23 0503  NA 123*   < >  --    < > 125* 125* 126* 128* 130* 131*  K 4.0   < >  --    < > 3.5 4.0 3.9 3.9 3.4* 3.6  CL 94*   < >  --    < > 97* 98 99 98 98 97*  CO2 22   < >  --    < > 20* 19* 20* 21* 22 24  GLUCOSE 147*   < >  --    <  > 136* 141* 127* 132* 136* 141*  BUN 14   < >  --    < > 14 17 18 20  25* 23  CREATININE 0.73   < >  --    < > 0.74 0.65 0.75 0.72 0.73 0.71  CALCIUM 8.7*   < >  --    < > 7.5* 7.2* 7.5* 7.4* 7.6* 7.9*  MG 2.7*   < > 2.1  --  2.0 2.3 2.1  --  1.9 2.0  PHOS 3.7  --  2.4*  --  2.3* 2.9  --   --   --   --    < > = values in this interval not displayed.   GFR: Estimated Creatinine Clearance: 83.4 mL/min (by C-G formula based on SCr of 0.71 mg/dL). Recent Labs  Lab 02/02/23 0559 02/02/23  8119 02/02/23 0818 02/02/23 1725 02/02/23 1732 02/03/23 0025 02/03/23 1478 02/03/23 0755 02/04/23 0207 02/05/23 0417 02/06/23 0512 02/07/23 0503  PROCALCITON  --   --   --   --   --   --  0.24  --   --   --   --   --   WBC  --    < >  --    < >  --    < >  --   --  8.8 11.0* 9.5 8.2  LATICACIDVEN 1.4  --  0.8  --  0.5  --   --  0.4*  --   --   --   --    < > = values in this interval not displayed.    Liver Function Tests: Recent Labs  Lab 02/02/23 0252  AST 180*  ALT 90*  ALKPHOS 39  BILITOT 0.6  PROT 5.4*  ALBUMIN 3.1*   No results for input(s): "LIPASE", "AMYLASE" in the last 168 hours. No results for input(s): "AMMONIA" in the last 168 hours.  ABG    Component Value Date/Time   PHART 7.295 (L) 02/03/2023 0749   PCO2ART 47.1 02/03/2023 0749   PO2ART 112 (H) 02/03/2023 0749   HCO3 22.6 02/03/2023 0749   TCO2 24 02/03/2023 0749   ACIDBASEDEF 4.0 (H) 02/03/2023 0749   O2SAT 71.4 02/07/2023 0500     Coagulation Profile: No results for input(s): "INR", "PROTIME" in the last 168 hours.  Cardiac Enzymes: No results for input(s): "CKTOTAL", "CKMB", "CKMBINDEX", "TROPONINI" in the last 168 hours.  HbA1C: Hgb A1c MFr Bld  Date/Time Value Ref Range Status  02/01/2023 05:29 AM 5.6 4.8 - 5.6 % Final    Comment:    (NOTE)         Prediabetes: 5.7 - 6.4         Diabetes: >6.4         Glycemic control for adults with diabetes: <7.0   10/15/2017 04:24 PM 5.8 (H) 4.8 - 5.6 % Final     Comment:             Prediabetes: 5.7 - 6.4          Diabetes: >6.4          Glycemic control for adults with diabetes: <7.0     CBG: Recent Labs  Lab 02/06/23 1527 02/06/23 2004 02/06/23 2358 02/07/23 0452 02/07/23 0851  GLUCAP 121* 135* 147* 131* 147*   This patient is critically ill with multiple organ system failure; which, requires frequent high complexity decision making, assessment, support, evaluation, and titration of therapies. This was completed through the application of advanced monitoring technologies and extensive interpretation of multiple databases. During this encounter critical care time was devoted to patient care services described in this note for 32 minutes.   Josephine Igo, DO [facility] 02/07/2023 10:31 AM

## 2023-02-07 NOTE — Evaluation (Addendum)
Physical Therapy Re-valuation Patient Details Name: Madison West MRN: [id number] DOB: 1956/06/12 Today's Date: 02/07/2023  History of Present Illness  Pt is a 67 y.o. female admitted 01/24/23 with acute CHF. S/p LHC 8/11 with mid LAD 95% lesion, stent placed. VTACH arrest x2 on 8/13 requiring CPR and multiple shocks for ROSC; found to have NSTEMI. S/p IABP placement 8/13-8/17. S/p TAVR 8/15. ETT 8/13-8/18. PMH includes PSVT, CAD, HTN, HLD, breast CA s/p lumpectomy, chemo, anxiety, depression.   Clinical Impression  Pt seen for re-evaluation now s/p IABP placement and TAVR, extubated earlier today. PTA, pt independent, working, lives with family. Today, pt requiring maxA for bed-level mobility, has maintained fair BLE strength/AROM though quick to fatigue and become tachypneic with activity while in chair position; requires frequent cues for breathing and relaxation. Pt would benefit from post-acute rehab services (>3 hrs/day pending activity progression) to maximize functional mobility and independence prior to return home. Will follow acutely to address established goals.     If plan is discharge home, recommend the following: A lot of help with walking and/or transfers;A lot of help with bathing/dressing/bathroom;Assistance with cooking/housework;Assist for transportation;Help with stairs or ramp for entrance   Can travel by private vehicle    No    Equipment Recommendations Other (comment) (TBD)  Recommendations for Other Services   Occupational Therapy   Functional Status Assessment Patient has had a recent decline in their functional status and demonstrates the ability to make significant improvements in function in a reasonable and predictable amount of time.     Precautions / Restrictions Precautions Precautions: Fall;Other (comment) Precaution Comments: multiple lines/leads (including swan), high anxiety      Mobility  Bed Mobility Overal bed mobility: Needs  Assistance Bed Mobility: Rolling Rolling: Max assist, +2 for physical assistance, +2 for safety/equipment, Used rails         General bed mobility comments: majority of session performed in chair position, maxA for trunk support to come to long sitting with hopes to improve quality of cough; maxA (+2 lines) to roll R/L for linen change and washup    Transfers                   General transfer comment: deferred    Ambulation/Gait                  Stairs            Wheelchair Mobility     Tilt Bed    Modified Rankin (Stroke Patients Only)       Balance Overall balance assessment: Needs assistance   Sitting balance-Leahy Scale: Poor Sitting balance - Comments: reliant on external assist to maintain seated balance without back support while bed in chair position                                     Pertinent Vitals/Pain Pain Assessment Pain Assessment: Faces Faces Pain Scale: Hurts a little bit Pain Location: chest Pain Descriptors / Indicators: Discomfort, Sore Pain Intervention(s): Monitored during session, Repositioned    Home Living Family/patient expects to be discharged to:: Private residence Living Arrangements: Children Available Help at Discharge: Family;Available PRN/intermittently Type of Home: House Home Access: Stairs to enter Entrance Stairs-Rails: Can reach both Entrance Stairs-Number of Steps: 2   Home Layout: One level Home Equipment: None      Prior Function Prior Level of Function : Independent/Modified Independent  Mobility Comments: typically independent without DME       Extremity/Trunk Assessment   Upper Extremity Assessment Upper Extremity Assessment: Generalized weakness (noted bilateral forearm/hand swelling; pt endorses significant pain in LUE at IV site limiting movement; some difficulty with fine motor tasks due to swelling, but still able to manage yaunker for suction)     Lower Extremity Assessment Lower Extremity Assessment: Generalized weakness (notable bilateral lower leg/foot/toe swelling; gross hip and knee strength >/ 3/5 throughout with bed in chair position)       Communication   Communication Communication: Difficulty communicating thoughts/reduced clarity of speech (hoarse, soft vocal quality post-extubation today)  Cognition Arousal: Alert Behavior During Therapy: Flat affect Overall Cognitive Status: Within Functional Limits for tasks assessed                                 General Comments: WFL for simple tasks, not formally assessed; following simple commands and answering majority of questions appropriately. would benefit from further assessment        General Comments General comments (skin integrity, edema, etc.): SpO2 >/92% on 15L O2 HFNC; pt quick to become tachypneic with seated tasks in chair position requiring frequent rest breaks and cues for breathing. noted bed saturated (likely sweat since pt has foley cath), therefore returned to supine with RN assist for bed mobility and washup/linen change    Exercises General Exercises - Lower Extremity Long Arc Quad: AROM, Both, Seated (chair position) Hip ABduction/ADduction: AROM, Both, Seated (chair position) Hip Flexion/Marching: AROM, Both, Seated (chair position) Other Exercises Other Exercises: anterior trunk translation from elevated HOB to long sitting with BLEs down in chair position, heavy reliance on trunk elevation assist (pulling through BUEs limited due to lines)   Assessment/Plan    PT Assessment Patient needs continued PT services  PT Problem List Decreased strength;Decreased activity tolerance;Decreased balance;Decreased mobility;Decreased knowledge of precautions;Cardiopulmonary status limiting activity;Decreased knowledge of use of DME       PT Treatment Interventions DME instruction;Gait training;Stair training;Functional mobility  training;Therapeutic activities;Therapeutic exercise;Balance training;Patient/family education    PT Goals (Current goals can be found in the Care Plan section)  Acute Rehab PT Goals Patient Stated Goal: return to work PT Goal Formulation: With patient Time For Goal Achievement: 02/21/23 Potential to Achieve Goals: Good    Frequency Min 1X/week     Co-evaluation               AM-PAC PT "6 Clicks" Mobility  Outcome Measure Help needed turning from your back to your side while in a flat bed without using bedrails?: A Lot Help needed moving from lying on your back to sitting on the side of a flat bed without using bedrails?: A Lot Help needed moving to and from a bed to a chair (including a wheelchair)?: Total Help needed standing up from a chair using your arms (e.g., wheelchair or bedside chair)?: Total Help needed to walk in hospital room?: Total Help needed climbing 3-5 steps with a railing? : Total 6 Click Score: 8    End of Session Equipment Utilized During Treatment: Gait belt;Oxygen Activity Tolerance: Patient tolerated treatment well;Patient limited by fatigue;Treatment limited secondary to medical complications (Comment) Patient left: in bed;with call bell/phone within reach;with nursing/sitter in room Nurse Communication: Mobility status PT Visit Diagnosis: Other abnormalities of gait and mobility (R26.89);Muscle weakness (generalized) (M62.81)    Time: 0981-1914 PT Time Calculation (min) (ACUTE ONLY): 32 min  Charges:   PT Evaluation $PT Eval Moderate Complexity: 1 Mod PT Treatments $Therapeutic Activity: 8-22 mins PT General Charges $$ ACUTE PT VISIT: 1 Visit       Ina Homes, PT, DPT Acute Rehabilitation Services  Personal: Secure Chat Rehab Office: [phone]  Malachy Chamber 02/07/2023, 4:31 PM

## 2023-02-08 ENCOUNTER — Institutional Professional Consult (permissible substitution): Payer: Medicare Other | Admitting: Pulmonary Disease

## 2023-02-08 ENCOUNTER — Other Ambulatory Visit: Payer: Self-pay

## 2023-02-08 DIAGNOSIS — R570 Cardiogenic shock: Secondary | ICD-10-CM | POA: Diagnosis not present

## 2023-02-08 LAB — CBC
HCT: 27 % — ABNORMAL LOW (ref 36.0–46.0)
Hemoglobin: 9.1 g/dL — ABNORMAL LOW (ref 12.0–15.0)
MCH: 31.4 pg (ref 26.0–34.0)
MCHC: 33.7 g/dL (ref 30.0–36.0)
MCV: 93.1 fL (ref 80.0–100.0)
Platelets: 280 10*3/uL (ref 150–400)
RBC: 2.9 MIL/uL — ABNORMAL LOW (ref 3.87–5.11)
RDW: 13.7 % (ref 11.5–15.5)
WBC: 9.4 10*3/uL (ref 4.0–10.5)
nRBC: 0 % (ref 0.0–0.2)

## 2023-02-08 LAB — CULTURE, BLOOD (ROUTINE X 2)
Culture: NO GROWTH
Culture: NO GROWTH
Special Requests: ADEQUATE
Special Requests: ADEQUATE

## 2023-02-08 LAB — BASIC METABOLIC PANEL
Anion gap: 11 (ref 5–15)
Anion gap: 12 (ref 5–15)
BUN: 18 mg/dL (ref 8–23)
BUN: 18 mg/dL (ref 8–23)
CO2: 29 mmol/L (ref 22–32)
CO2: 28 mmol/L (ref 22–32)
Calcium: 8.1 mg/dL — ABNORMAL LOW (ref 8.9–10.3)
Calcium: 8.3 mg/dL — ABNORMAL LOW (ref 8.9–10.3)
Chloride: 95 mmol/L — ABNORMAL LOW (ref 98–111)
Chloride: 95 mmol/L — ABNORMAL LOW (ref 98–111)
Creatinine, Ser: 0.94 mg/dL (ref 0.44–1.00)
Creatinine, Ser: 0.82 mg/dL (ref 0.44–1.00)
GFR, Estimated: >60 mL/min (ref >60)
GFR, Estimated: >60 mL/min (ref >60)
Glucose, Bld: 114 mg/dL — ABNORMAL HIGH (ref 70–99)
Glucose, Bld: 121 mg/dL — ABNORMAL HIGH (ref 70–99)
Potassium: 2.9 mmol/L — ABNORMAL LOW (ref 3.5–5.1)
Potassium: 3.3 mmol/L — ABNORMAL LOW (ref 3.5–5.1)
Sodium: 135 mmol/L (ref 135–145)
Sodium: 135 mmol/L (ref 135–145)

## 2023-02-08 LAB — GLUCOSE, CAPILLARY
Glucose-Capillary: 112 mg/dL — ABNORMAL HIGH (ref 70–99)
Glucose-Capillary: 126 mg/dL — ABNORMAL HIGH (ref 70–99)
Glucose-Capillary: 123 mg/dL — ABNORMAL HIGH (ref 70–99)
Glucose-Capillary: 129 mg/dL — ABNORMAL HIGH (ref 70–99)
Glucose-Capillary: 103 mg/dL — ABNORMAL HIGH (ref 70–99)

## 2023-02-08 LAB — POTASSIUM: Potassium: 3.1 mmol/L — ABNORMAL LOW (ref 3.5–5.1)

## 2023-02-08 LAB — MAGNESIUM: Magnesium: 1.9 mg/dL (ref 1.7–2.4)

## 2023-02-08 LAB — COOXEMETRY PANEL
Carboxyhemoglobin: 1.6 % — ABNORMAL HIGH (ref 0.5–1.5)
Methemoglobin: 2.6 % — ABNORMAL HIGH (ref 0.0–1.5)
O2 Saturation: 68.1 %
Total hemoglobin: 9.9 g/dL — ABNORMAL LOW (ref 12.0–16.0)

## 2023-02-08 MED ORDER — MORPHINE SULFATE (PF) 2 MG/ML IV SOLN
4.0000 mg | INTRAVENOUS | Status: DC | PRN
  Administered 2023-02-08: 4 mg via INTRAVENOUS
  Filled 2023-02-08: qty 2

## 2023-02-08 MED ORDER — ALPRAZOLAM 0.5 MG PO TABS
3.0000 mg | ORAL_TABLET | ORAL | Status: DC | PRN
  Administered 2023-02-08: 3 mg via ORAL
  Filled 2023-02-08: qty 6

## 2023-02-08 MED ORDER — SODIUM CHLORIDE 0.9% FLUSH: 10.0000 mL | INTRAVENOUS | Status: DC | PRN

## 2023-02-08 MED ORDER — METHOCARBAMOL 1000 MG/10ML IJ SOLN
500.0000 mg | Freq: Two times a day (BID) | INTRAVENOUS | Status: DC
  Administered 2023-02-08 – 2023-02-10 (×5): 500 mg via INTRAVENOUS
  Filled 2023-02-08: qty 500
  Filled 2023-02-08: qty 5
  Filled 2023-02-08 (×4): qty 500

## 2023-02-08 MED ORDER — MORPHINE SULFATE (PF) 2 MG/ML IV SOLN
2.0000 mg | INTRAVENOUS | Status: DC | PRN
  Administered 2023-02-08 (×3): 2 mg via INTRAVENOUS
  Filled 2023-02-08 (×3): qty 1

## 2023-02-08 MED ORDER — POTASSIUM CHLORIDE 10 MEQ/50ML IV SOLN
INTRAVENOUS | Status: AC
  Filled 2023-02-08: qty 50

## 2023-02-08 MED ORDER — ALPRAZOLAM 0.5 MG PO TABS
3.0000 mg | ORAL_TABLET | Freq: Three times a day (TID) | ORAL | Status: DC | PRN
  Administered 2023-02-08 – 2023-02-15 (×17): 3 mg via ORAL
  Filled 2023-02-08: qty 12
  Filled 2023-02-08 (×3): qty 6
  Filled 2023-02-08: qty 12
  Filled 2023-02-08 (×3): qty 6
  Filled 2023-02-08: qty 12
  Filled 2023-02-08 (×8): qty 6

## 2023-02-08 MED ORDER — POTASSIUM CHLORIDE 10 MEQ/50ML IV SOLN
10.0000 meq | INTRAVENOUS | Status: AC
  Administered 2023-02-08 (×8): 10 meq via INTRAVENOUS
  Filled 2023-02-08 (×7): qty 50

## 2023-02-08 MED ORDER — POTASSIUM CHLORIDE 10 MEQ/50ML IV SOLN
10.0000 meq | INTRAVENOUS | Status: AC
  Administered 2023-02-08 (×6): 10 meq via INTRAVENOUS
  Filled 2023-02-08 (×6): qty 50

## 2023-02-08 MED ORDER — BUSPIRONE HCL 10 MG PO TABS
10.0000 mg | ORAL_TABLET | Freq: Two times a day (BID) | ORAL | Status: DC
  Administered 2023-02-08 – 2023-02-18 (×21): 10 mg via ORAL
  Filled 2023-02-08 (×21): qty 1

## 2023-02-08 MED ORDER — MAGNESIUM SULFATE 2 GM/50ML IV SOLN
2.0000 g | Freq: Once | INTRAVENOUS | Status: AC
  Administered 2023-02-08: 2 g via INTRAVENOUS
  Filled 2023-02-08: qty 50

## 2023-02-08 MED ORDER — SODIUM CHLORIDE 0.9% FLUSH
10.0000 mL | Freq: Two times a day (BID) | INTRAVENOUS | Status: DC
  Administered 2023-02-08 – 2023-02-16 (×13): 10 mL
  Administered 2023-02-16: 20 mL
  Administered 2023-02-17 – 2023-02-18 (×2): 10 mL

## 2023-02-08 MED ORDER — FUROSEMIDE 40 MG PO TABS
40.0000 mg | ORAL_TABLET | Freq: Every day | ORAL | Status: DC
  Administered 2023-02-08 – 2023-02-10 (×3): 40 mg via ORAL
  Filled 2023-02-08 (×3): qty 1

## 2023-02-08 NOTE — Progress Notes (Signed)
[facility] ADULT ICU REPLACEMENT PROTOCOL   The patient does apply for the [facility] Adult ICU Electrolyte Replacment Protocol based on the criteria listed below:   1.Exclusion criteria: TCTS, ECMO, Dialysis, and Myasthenia Gravis patients 2. Is GFR >/= 30 ml/min? Yes.    Patient's GFR today is >60 3. Is SCr </= 2? Yes.   Patient's SCr is 0.94 mg/dL 4. Did SCr increase >/= 0.5 in 24 hours? No. 5.Pt's weight >40kg  Yes.   6. Abnormal electrolyte(s): K+2.9  7. Electrolytes replaced per protocol 8.  Call MD STAT for K+ </= 2.5, Phos </= 1, or Mag </= 1 Physician:  Dr. Loralyn Freshwater, Lilia Argue 02/08/2023 3:01 AM

## 2023-02-08 NOTE — Progress Notes (Signed)
Brief Nutrition Note:   Extubated 8/18, currently on HFNC 15L.  Noted pt very anxious  OG removed with extubation, no enteral access, TF not infusing.  Currently NPO, SLP following but not ready for diet advancement today  +constipation, +flatus. Last BM documented 8/12  If unable to advance diet in the next couple of days, recommend considering Cortrak placement. Further recommendations on follow-up  Romelle Starcher MS, RDN, LDN, CNSC Registered Dietitian 3 Clinical Nutrition RD Pager and On-Call Pager Number Located in [place]

## 2023-02-08 NOTE — Progress Notes (Signed)
Peripherally Inserted Central Catheter Placement  The IV Nurse has discussed with the patient and/or persons authorized to consent for the patient, the purpose of this procedure and the potential benefits and risks involved with this procedure.  The benefits include less needle sticks, lab draws from the catheter, and the patient may be discharged home with the catheter. Risks include, but not limited to, infection, bleeding, blood clot (thrombus formation), and puncture of an artery; nerve damage and irregular heartbeat and possibility to perform a PICC exchange if needed/ordered by physician.  Alternatives to this procedure were also discussed.  Bard Power PICC patient education guide, fact sheet on infection prevention and patient information card has been provided to patient /or left at bedside.    PICC Placement Documentation  PICC Triple Lumen 02/08/23 Left Brachial 45 cm 0 cm (Active)  Indication for Insertion or Continuance of Line Vasoactive infusions 02/08/23 1618  Exposed Catheter (cm) 0 cm 02/08/23 1618  Site Assessment Clean, Dry, Intact 02/08/23 1618  Lumen #1 Status Flushed;Saline locked;Blood return noted 02/08/23 1618  Lumen #2 Status Flushed;Saline locked;Blood return noted 02/08/23 1618  Lumen #3 Status Flushed;Saline locked;Blood return noted 02/08/23 1618  Dressing Type Transparent;Securing device 02/08/23 1618  Dressing Status Antimicrobial disc in place;Clean, Dry, Intact 02/08/23 1618  Dressing Intervention New dressing 02/08/23 1618  Dressing Change Due 02/15/23 02/08/23 1618       Curt Jews 02/08/2023, 4:20 PM

## 2023-02-08 NOTE — Progress Notes (Signed)
Patient ID: Madison West, female   DOB: 1955-08-29, 67 y.o.   MRN: [id number]     Advanced Heart Failure Rounding Note  PCP-Cardiologist: None   Subjective:    - 8/15 s/p TAVR. Coronary angiogram w/ widely patent stent. Post op echo w/ no paravalvular leak. - Echo (8/16): EF 35-40%, mild LVH, WMAs, RV normal, bioprosthetic aortic valve s/p TAVR with mean gradient 13 mmH, trivial PVL, IVC mildly dilated.  - IABP removed 8/17 - extubated 8/18  On milrinone 0.375. Started on lasix gtt yesterday. -7L UOP, net - 4.8L. weight down 5lbs.   SWAN #s CVP 4 PA 38/21  PCW 18 CI 4.3 Co-ox 68%  No further VT on amiodarone gtt.   Afebrile.  PNA, Enterobacter cloacae on trach aspirate culture.  Remains on Cefepime.    Feels nervous this morning, otherwise doing well.   Objective:   Weight Range: 101.2 kg Body mass index is 33.92 kg/m.   Vital Signs:   Temp:  [98.2 F (36.8 C)-100.2 F (37.9 C)] 98.4 F (36.9 C) (08/19 0800) Pulse Rate:  [64-95] 84 (08/19 0800) Resp:  [16-38] 22 (08/19 0800) BP: (128-171)/(45-62) 171/60 (08/19 0800) SpO2:  [88 %-96 %] 95 % (08/19 0800) Arterial Line BP: (69-194)/(34-175) 149/48 (08/19 0643) Weight:  [101.2 kg] 101.2 kg (08/19 0645) Last BM Date : 02/01/23  Weight change: Filed Weights   02/06/23 0309 02/07/23 0500 02/08/23 0645  Weight: 104.8 kg 103.1 kg 101.2 kg    Intake/Output:   Intake/Output Summary (Last 24 hours) at 02/08/2023 0830 Last data filed at 02/08/2023 0800 Gross per 24 hour  Intake 2333.29 ml  Output 7470 ml  Net -5136.71 ml    CVP 4 Physical Exam  General:  ill appearing.  No respiratory difficulty HEENT: normal Neck: supple. JVD ~5 cm. Carotids 2+ bilat; no bruits. No lymphadenopathy or thyromegaly appreciated. LIJ CVC. Swann RIJ Cor: PMI nondisplaced. Regular rate & rhythm. No rubs, gallops or murmurs. Lungs: clear Abdomen: soft, nontender, nondistended. No hepatosplenomegaly. No bruits or masses. Good bowel  sounds. Extremities: no cyanosis, clubbing, rash, trace BLE edema  GU: +foley Neuro: alert & oriented x 3, cranial nerves grossly intact. moves all 4 extremities w/o difficulty. Affect pleasant.  Telemetry   NSR 80s. No further VT.  (Personally reviewed)    Labs    CBC Recent Labs    02/07/23 0503 02/08/23 0156  WBC 8.2 9.4  HGB 9.0* 9.1*  HCT 26.6* 27.0*  MCV 93.3 93.1  PLT 260 280   Basic Metabolic Panel Recent Labs    47/82/95 0503 02/07/23 1315 02/08/23 0156 02/08/23 0230 02/08/23 0544  NA 131* 133* 135  --   --   K 3.6 3.9 2.9*  --  3.1*  CL 97* 98 95*  --   --   CO2 24 25 29   --   --   GLUCOSE 141* 118* 114*  --   --   BUN 23 25* 18  --   --   CREATININE 0.71 0.75 0.94  --   --   CALCIUM 7.9* 8.2* 8.1*  --   --   MG 2.0  --   --  1.9  --    Liver Function Tests No results for input(s): "AST", "ALT", "ALKPHOS", "BILITOT", "PROT", "ALBUMIN" in the last 72 hours.  No results for input(s): "LIPASE", "AMYLASE" in the last 72 hours. Cardiac Enzymes No results for input(s): "CKTOTAL", "CKMB", "CKMBINDEX", "TROPONINI" in the last 72 hours.  BNP: BNP (  last 3 results) Recent Labs    01/23/23 2208  BNP 462.3*    ProBNP (last 3 results) No results for input(s): "PROBNP" in the last 8760 hours.   D-Dimer No results for input(s): "DDIMER" in the last 72 hours. Hemoglobin A1C No results for input(s): "HGBA1C" in the last 72 hours.  Fasting Lipid Panel No results for input(s): "CHOL", "HDL", "LDLCALC", "TRIG", "CHOLHDL", "LDLDIRECT" in the last 72 hours.  Thyroid Function Tests No results for input(s): "TSH", "T4TOTAL", "T3FREE", "THYROIDAB" in the last 72 hours.  Invalid input(s): "FREET3"  Other results:   Imaging    No results found.   Medications:     Scheduled Medications:  aspirin  81 mg Oral Daily   busPIRone  10 mg Oral BID   Chlorhexidine Gluconate Cloth  6 each Topical Daily   docusate  100 mg Oral BID   famotidine  20 mg Oral  BID   feeding supplement (PROSource TF20)  60 mL Per Tube BID   heparin injection (subcutaneous)  5,000 Units Subcutaneous Q8H   insulin aspart  2-6 Units Subcutaneous Q4H   mupirocin ointment  1 Application Nasal BID   mouth rinse  15 mL Mouth Rinse 4 times per day   polyethylene glycol  17 g Oral BID   rosuvastatin  20 mg Oral Daily   sodium chloride flush  3 mL Intravenous Q12H   sodium chloride flush  3 mL Intravenous Q12H   ticagrelor  90 mg Oral BID    Infusions:  sodium chloride     sodium chloride Stopped (02/08/23 0416)   sodium chloride     sodium chloride     sodium chloride     albumin human Stopped (02/03/23 1032)   amiodarone 30 mg/hr (02/08/23 0800)   ceFEPime (MAXIPIME) IV 2 g (02/08/23 0828)   dexmedetomidine (PRECEDEX) IV infusion Stopped (02/08/23 0553)   feeding supplement (VITAL AF 1.2 CAL) Stopped (02/07/23 1030)   furosemide (LASIX) 200 mg in dextrose 5 % 100 mL (2 mg/mL) infusion 5 mg/hr (02/08/23 0800)   methocarbamol (ROBAXIN) IV     milrinone 0.375 mcg/kg/min (02/08/23 0800)   norepinephrine (LEVOPHED) Adult infusion Stopped (02/08/23 0503)   potassium chloride 10 mEq (02/08/23 0825)   potassium chloride     sodium chloride      PRN Medications: sodium chloride, sodium chloride, Place/Maintain arterial line **AND** sodium chloride, sodium chloride, acetaminophen, albumin human, alum & mag hydroxide-simeth, bisacodyl, calcium carbonate, hydrALAZINE, lidocaine, lidocaine, LORazepam, morphine injection **OR** morphine injection, nitroGLYCERIN, ondansetron (ZOFRAN) IV, mouth rinse, oxyCODONE, potassium chloride, sodium chloride flush, sodium chloride flush    Patient Profile  67 y.o. female with history of CAD s/p PCI to RCA in 2021, carotid artery stenosis s/p R CEA, aortic valve stenosis. Admitted with unstable angina. Found to have severe LAD stenosis s/p PCI and severe low flow low gradient AS. Coarse c/b refractory VT and hemodynamic instability >>  cardiogenic shock. Assessment/Plan  Acute systolic CHF >> cardiogenic shock - In setting of severe ischemic heart disease, reduced EF, severe AS and refractory VT/VF - Echo 01/25/23: EF 30-35%, septum and apex HK, RV low normal, low flow low graident severe AS with mean gradient 20 mmHg, AVA 0.93 cm2, DI 0.33 - echo 02/01/23: EF 20-25%, RV okay, severe low flow low gradient severe AS with mean gradient 25 mmHg, AVA 1.1 cm2 - s/p PCI/stent to LAD. Stent thrombosis on 08/09 treated with thrombectomy.  - IABP placed 08/13. Had considered [facility] ECMO but hemodynamics  improved with inotrope support - S/p TAVR 8/15 - Echo 8/16 with EF 35-40%, mild LVH, WMAs, RV normal, bioprosthetic aortic valve s/p TAVR with mean gradient 13 mmH, trivial PVL, IVC mildly dilated.  - IABP removed 8/17 - Continues on 0.375 milrinone. NE stopped this morning. CI 4.37. Co-ox 68%.  - CVP 4, on Lasix gtt 10 mg/hr.  -7L UOP yesterday, weight down 5lbs. Stop lasix gtt.   2. Refractory VT - S/p multiple shocks.  - No further VT over the last day  - Continue amiodarone gtt at 30 mg/hr.  - Replace K/Mg.  - K 2.9>3.1 this morning, suspect 2/2 diuresis. Getting 8 runs IV K. Follow BMET 1-2 hrs after runs finished    3. CAD NSTEMI -HX PCI to RCA in 2021 -Presented with unstable angina. S/p PCI/stent to LAD on 08/07. Stent thrombosis 08/09 treated with thrombectomy -HS troponin > 24K on 08/12 -LHC 08/12 with patent LAD stent -LAD appeared widely patent on TAVR angiogram -Continue DAPT and statin.    4. LFLG Severe AS -Noted on echo this admit -Not candidate for SAVR -s/p TAVR 8/15, valve stable on 8/15 echo with trivial PVL and low gradient.   5. Carotid artery stenosis -S/p R CEA in 2020  6. ID -Developed sepsis 08/13. Now on cefepime + vancomycin -Fever resolved.  -Enterobacter cloacae in trach aspirate.   7. Respiratory failure -Vent management per CCM => SBT this morning, hopefully extubate.   8.  Hyponatremia -suspected hypervolemic hyponatremia -Na improved with diuresis, 135 today  Ok to pull [address] and aline. Keep CVC in.   Alen Bleacher, NP  8:30 AM

## 2023-02-08 NOTE — Evaluation (Signed)
Occupational Therapy Evaluation Patient Details Name: Madison West MRN: [id number] DOB: October 15, 1955 Today's Date: 02/08/2023   History of Present Illness Pt is a 67 y.o. female admitted 01/24/23 with acute CHF. S/p LHC 8/11 with mid LAD 95% lesion, stent placed. VTACH arrest x2 on 8/13 requiring CPR and multiple shocks for ROSC; found to have NSTEMI. S/p IABP placement 8/13-8/17. S/p TAVR 8/15. ETT 8/13-8/18. PMH includes PSVT, CAD, HTN, HLD, breast CA s/p lumpectomy, chemo, anxiety, depression.   Clinical Impression   Pt independent at baseline and working, lives with daughter and family who can assist PRN at d/c. Pt currently needing set up -total A for ADLs, max A +2 for bed mobility. Pt anxious, encouraged pt to attempt sitting EOB, however session limited due to needing to use bed pan. Pt presenting with impairments listed below, will follow acutely. Patient will benefit from intensive inpatient follow up therapy, >3 hours/day to maximize safety/ind with ADLs/functional mobility.        If plan is discharge home, recommend the following: Two people to help with walking and/or transfers;A lot of help with bathing/dressing/bathroom;Assistance with cooking/housework;Direct supervision/assist for medications management;Direct supervision/assist for financial management;Assist for transportation    Functional Status Assessment  Patient has had a recent decline in their functional status and demonstrates the ability to make significant improvements in function in a reasonable and predictable amount of time.  Equipment Recommendations  Other (comment) (defer)    Recommendations for Other Services PT consult;Rehab consult     Precautions / Restrictions Precautions Precautions: Fall;Other (comment) Precaution Comments: multiple lines/leads (including swan), high anxiety Restrictions Weight Bearing Restrictions: No      Mobility Bed Mobility Overal bed mobility: Needs  Assistance Bed Mobility: Rolling Rolling: Max assist, +2 for physical assistance, +2 for safety/equipment, Used rails         General bed mobility comments: max A +2 for rolling/placement of bedpan    Transfers Overall transfer level: Needs assistance                 General transfer comment: deferred due pt needing to use bedpan      Balance                                           ADL either performed or assessed with clinical judgement   ADL Overall ADL's : Needs assistance/impaired Eating/Feeding: NPO   Grooming: Set up;Bed level   Upper Body Bathing: Moderate assistance;Bed level   Lower Body Bathing: Maximal assistance;Bed level   Upper Body Dressing : Moderate assistance;Bed level   Lower Body Dressing: Maximal assistance;Bed level   Toilet Transfer: Maximal assistance   Toileting- Clothing Manipulation and Hygiene: Total assistance       Functional mobility during ADLs: Maximal assistance       Vision   Vision Assessment?: No apparent visual deficits     Perception Perception: Not tested       Praxis Praxis: Not tested       Pertinent Vitals/Pain Pain Assessment Pain Assessment: Faces Pain Location: chest/throat Pain Descriptors / Indicators: Discomfort, Sore Pain Intervention(s): Limited activity within patient's tolerance     Extremity/Trunk Assessment Upper Extremity Assessment Upper Extremity Assessment: Generalized weakness   Lower Extremity Assessment Lower Extremity Assessment: Defer to PT evaluation       Communication Communication Communication: Difficulty communicating thoughts/reduced clarity of speech (hoarse voice s/p  extubation)   Cognition Arousal: Alert Behavior During Therapy: Flat affect Overall Cognitive Status: Within Functional Limits for tasks assessed                                 General Comments: WFL for simple tasks, not formally assessed; following simple  commands and answering majority of questions appropriately. would benefit from further assessment     General Comments  VSS on 15L O2 HFNC    Exercises     Shoulder Instructions      Home Living Family/patient expects to be discharged to:: Private residence Living Arrangements: Children Available Help at Discharge: Family;Available PRN/intermittently Type of Home: House Home Access: Stairs to enter [employer] of Steps: 2 Entrance Stairs-Rails: Can reach both Home Layout: One level     Bathroom Shower/Tub: Tub/shower unit;Walk-in shower         Home Equipment: None          Prior Functioning/Environment Prior Level of Function : Independent/Modified Independent             Mobility Comments: typically independent without DME ADLs Comments: ind; worked as a Buyer, retail Problem List: Decreased strength;Decreased range of motion;Impaired balance (sitting and/or standing);Decreased activity tolerance;Cardiopulmonary status limiting activity      OT Treatment/Interventions: Self-care/ADL training;Therapeutic exercise;Energy conservation;DME and/or AE instruction;Therapeutic activities;Balance training;Cognitive remediation/compensation;Patient/family education    OT Goals(Current goals can be found in the care plan section) Acute Rehab OT Goals Patient Stated Goal: none stated OT Goal Formulation: With patient Time For Goal Achievement: 02/22/23 Potential to Achieve Goals: Good ADL Goals Pt Will Perform Upper Body Dressing: with min assist;sitting Pt Will Perform Lower Body Dressing: with min assist;sitting/lateral leans;sit to/from stand Pt Will Transfer to Toilet: with min assist;squat pivot transfer;stand pivot transfer;bedside commode Additional ADL Goal #1: pt will perform bed mobility minA in prep for ADLs  OT Frequency: Min 1X/week    Co-evaluation              AM-PAC OT "6 Clicks" Daily Activity     Outcome Measure Help from  another person eating meals?:  (NPO) Help from another person taking care of personal grooming?: A Little Help from another person toileting, which includes using toliet, bedpan, or urinal?: A Lot Help from another person bathing (including washing, rinsing, drying)?: A Lot Help from another person to put on and taking off regular upper body clothing?: A Lot Help from another person to put on and taking off regular lower body clothing?: A Lot 6 Click Score: 11   End of Session Equipment Utilized During Treatment: Oxygen Nurse Communication: Mobility status  Activity Tolerance: Patient tolerated treatment well Patient left: in bed;with call bell/phone within reach  OT Visit Diagnosis: Unsteadiness on feet (R26.81);Other abnormalities of gait and mobility (R26.89);Muscle weakness (generalized) (M62.81)                Time: 3664-4034 OT Time Calculation (min): 21 min Charges:  OT General Charges $OT Visit: 1 Visit OT Evaluation $OT Eval Low Complexity: 1 Low  Carver Fila, OTD, OTR/L SecureChat Preferred Acute Rehab [phone]   Carver Fila Koonce 02/08/2023, 12:37 PM

## 2023-02-08 NOTE — Progress Notes (Signed)
Inpatient Rehab Admissions Coordinator:   Per therapy recommendations, patient was screened for CIR candidacy by Megan Salon, MS, CCC-SLP. At this time, Pt. Has not yet attempted OOB and is not at a level to tolerate the intensity of CIR; however,   Pt. may have potential to progress to becoming a potential CIR candidate, so CIR admissions team will follow and monitor for progress and participation with therapies and place consult order if Pt. appears to be an appropriate candidate. Please contact me with any questions.    Megan Salon, MS, CCC-SLP Rehab Admissions Coordinator  [phone] (celll) [phone] (office)

## 2023-02-08 NOTE — Progress Notes (Signed)
Occupational Therapy Treatment Patient Details Name: Madison West MRN: [id number] DOB: Feb 22, 1956 Today's Date: 02/08/2023   History of present illness Pt is a 67 y.o. female admitted 01/24/23 with acute CHF. S/p LHC 8/11 with mid LAD 95% lesion, stent placed. VTACH arrest x2 on 8/13 requiring CPR and multiple shocks for ROSC; found to have NSTEMI. S/p IABP placement 8/13-8/17. S/p TAVR 8/15. ETT 8/13-8/18. PMH includes PSVT, CAD, HTN, HLD, breast CA s/p lumpectomy, chemo, anxiety, depression.   OT comments  Pt seen for second session to attempt EOB mobility, pt able to sit EOB x5 min, max A for bed mobility. Pt reporting back pain while seated EOB, able to complete short grooming task while seated and at bed level. Pt presenting with impairments listed below, will follow acutely. Patient will benefit from intensive inpatient follow up therapy, >3 hours/day to maximize safety/ind with ADLs/functional mobility.       If plan is discharge home, recommend the following:  Two people to help with walking and/or transfers;A lot of help with bathing/dressing/bathroom;Assistance with cooking/housework;Direct supervision/assist for medications management;Direct supervision/assist for financial management;Assist for transportation   Equipment Recommendations  Other (comment) (defer)    Recommendations for Other Services PT consult;Rehab consult    Precautions / Restrictions Precautions Precautions: Fall;Other (comment) Precaution Comments: multiple lines/leads (including swan), high anxiety Restrictions Weight Bearing Restrictions: No       Mobility Bed Mobility Overal bed mobility: Needs Assistance Bed Mobility: Supine to Sit, Sit to Supine Rolling: Max assist   Supine to sit: Max assist     General bed mobility comments: max A +2 to scoot up in bed    Transfers Overall transfer level: Needs assistance                 General transfer comment: deferred     Balance  Overall balance assessment: Needs assistance Sitting-balance support: Bilateral upper extremity supported Sitting balance-Leahy Scale: Poor Sitting balance - Comments: needs mod posterior support to remain upright                                   ADL either performed or assessed with clinical judgement   ADL Overall ADL's : Needs assistance/impaired Eating/Feeding: NPO   Grooming: Set up;Sitting;Bed level Grooming Details (indicate cue type and reason): wiping face Upper Body Bathing: Moderate assistance;Bed level   Lower Body Bathing: Maximal assistance;Bed level   Upper Body Dressing : Moderate assistance;Bed level   Lower Body Dressing: Maximal assistance;Bed level   Toilet Transfer: Maximal assistance   Toileting- Clothing Manipulation and Hygiene: Total assistance       Functional mobility during ADLs: Maximal assistance      Extremity/Trunk Assessment Upper Extremity Assessment Upper Extremity Assessment: Generalized weakness   Lower Extremity Assessment Lower Extremity Assessment: Defer to PT evaluation        Vision   Vision Assessment?: No apparent visual deficits   Perception Perception Perception: Not tested   Praxis Praxis Praxis: Not tested    Cognition Arousal: Alert Behavior During Therapy: Flat affect Overall Cognitive Status: Within Functional Limits for tasks assessed                                 General Comments: WFL for simple tasks, not formally assessed; following simple commands and answering majority of questions appropriately. would benefit from further assessment  Exercises      Shoulder Instructions       General Comments VSS on 15L O2 HFNC    Pertinent Vitals/ Pain       Pain Assessment Pain Assessment: Faces Pain Score: 3  Faces Pain Scale: Hurts little more Pain Location: back Pain Descriptors / Indicators: Discomfort, Sore Pain Intervention(s): Limited activity within  patient's tolerance, Monitored during session, Repositioned  Home Living Family/patient expects to be discharged to:: Private residence Living Arrangements: Children Available Help at Discharge: Family;Available PRN/intermittently Type of Home: House Home Access: Stairs to enter [employer] of Steps: 2 Entrance Stairs-Rails: Can reach both Home Layout: One level     Bathroom Shower/Tub: Tub/shower unit;Walk-in shower         Home Equipment: None          Prior Functioning/Environment              Frequency  Min 1X/week        Progress Toward Goals  OT Goals(current goals can now be found in the care plan section)  Progress towards OT goals: Progressing toward goals  Acute Rehab OT Goals Patient Stated Goal: none stated OT Goal Formulation: With patient Time For Goal Achievement: 02/22/23 Potential to Achieve Goals: Good ADL Goals Pt Will Perform Upper Body Dressing: with min assist;sitting Pt Will Perform Lower Body Dressing: with min assist;sitting/lateral leans;sit to/from stand Pt Will Transfer to Toilet: with min assist;squat pivot transfer;stand pivot transfer;bedside commode Additional ADL Goal #1: pt will perform bed mobility minA in prep for ADLs  Plan      Co-evaluation                 AM-PAC OT "6 Clicks" Daily Activity     Outcome Measure   Help from another person eating meals?:  (NPO) Help from another person taking care of personal grooming?: A Little Help from another person toileting, which includes using toliet, bedpan, or urinal?: A Lot Help from another person bathing (including washing, rinsing, drying)?: A Lot Help from another person to put on and taking off regular upper body clothing?: A Lot Help from another person to put on and taking off regular lower body clothing?: A Lot 6 Click Score: 11    End of Session Equipment Utilized During Treatment: Oxygen  OT Visit Diagnosis: Unsteadiness on feet  (R26.81);Other abnormalities of gait and mobility (R26.89);Muscle weakness (generalized) (M62.81)   Activity Tolerance Patient tolerated treatment well   Patient Left in bed;with call bell/phone within reach;with SCD's reapplied   Nurse Communication Mobility status        Time: 6962-9528 OT Time Calculation (min): 32 min  Charges: OT General Charges $OT Visit: 1 Visit OT Evaluation $OT Eval Low Complexity: 1 Low OT Treatments $Self Care/Home Management : 8-22 mins $Therapeutic Activity: 8-22 mins  Carver Fila, OTD, OTR/L SecureChat Preferred Acute Rehab [phone]   Carver Fila Koonce 02/08/2023, 3:38 PM

## 2023-02-08 NOTE — Progress Notes (Signed)
Pharmacy Antibiotic Note  Madison West is a 67 y.o. female admitted on 01/23/2023 s/p IABP/DES/planning TAVR with new fever and wbc 13 PCT0.25 and green mucus drainage from resp tract  started on vancomycin and cefepime.  Now afebrile, wbc wnl BCx ngtd, sputum cx enterobacter - pan sensitive. Cr0.8 crcl appr108ml/min    Plan: Vancomcin stopped Cefepime 2gm IV q8h x7 days - ends 8/20  Height: 5\' 8"  (172.7 cm) Weight: 101.2 kg (223 lb 1.7 oz) IBW/kg (Calculated) : 63.9  Temp (24hrs), Avg:99.2 F (37.3 C), Min:97.9 F (36.6 C), Max:100.2 F (37.9 C)  Recent Labs  Lab 02/02/23 0011 02/02/23 0252 02/02/23 0559 02/02/23 0603 02/02/23 0818 02/02/23 1725 02/02/23 1732 02/03/23 0025 02/03/23 0755 02/03/23 1603 02/04/23 0207 02/04/23 1940 02/05/23 0417 02/05/23 0750 02/05/23 1948 02/06/23 0512 02/07/23 0503 02/07/23 1315 02/08/23 0156  WBC  --    < >  --    < >  --    < >  --    < >  --   --  8.8  --  11.0*  --   --  9.5 8.2  --  9.4  CREATININE  --    < >  --    < >  --    < >  --    < >  --    < > 0.74   < > 0.75  --  0.72 0.73 0.71 0.75 0.94  LATICACIDVEN 1.5  --  1.4  --  0.8  --  0.5  --  0.4*  --   --   --   --   --   --   --   --   --   --   VANCOTROUGH  --   --   --   --   --   --   --   --   --   --   --   --   --  13*  --   --   --   --   --    < > = values in this interval not displayed.    Estimated Creatinine Clearance: 70.3 mL/min (by C-G formula based on SCr of 0.94 mg/dL).    Allergies  Allergen Reactions   Prednisone Palpitations   Fentanyl Swelling and Other (See Comments)    Makes pt "hyper" feels she is "climbing the walls."  Last time she had a colonoscopy with Fentanyl and Versed she was awake the whole time.   Versed [Midazolam] Swelling and Anxiety    Makes pt "hyper" feels she is "climbing the walls."  Last time she had a colonoscopy with Fentanyl and Versed she was awake the whole time.   Vicodin [Hydrocodone-Acetaminophen] Anxiety    Extreme  anxiety.  OK to take oxycodone.    Antimicrobials this admission:   Dose adjustments this admission:   Microbiology results: 8/14 BCx: ngtd 8/14 Sputum: enterobacter - pan sensitive  Reece Leader, Colon Flattery, [facility] Clinical Pharmacist  02/08/2023 2:39 PM   [facility] pharmacy phone numbers are listed on [email or web address]

## 2023-02-08 NOTE — Progress Notes (Signed)
[facility] ADULT ICU REPLACEMENT PROTOCOL   The patient does apply for the [facility] Adult ICU Electrolyte Replacment Protocol based on the criteria listed below:   1.Exclusion criteria: TCTS, ECMO, Dialysis, and Myasthenia Gravis patients 2. Is GFR >/= 30 ml/min? Yes.    Patient's GFR today is >60 3. Is SCr </= 2? Yes.   Patient's SCr is 0.94 mg/dL 4. Did SCr increase >/= 0.5 in 24 hours? No. 5.Pt's weight >40kg  Yes.   6. Abnormal electrolyte(s): Mag 1.9  7. Electrolytes replaced per protocol 8.  Call MD STAT for K+ </= 2.5, Phos </= 1, or Mag </= 1 Physician:  Dr. Loralyn Freshwater, Lilia Argue 02/08/2023 3:31 AM

## 2023-02-08 NOTE — Progress Notes (Addendum)
eLink Physician-Brief Progress Note Patient Name: Madison West DOB: 1956-03-17 MRN: [id number]   Date of Service  02/08/2023  HPI/Events of Note  67 yo F w/ pertinent PMH PSVT, HTN, HLD, R carotid artery stenosis s/p endarterectomy 2020, CAD s/p stenting 2021, moderate aortic stenosis presents to [facility] ED on 8/9 w/ SOB.  She is diagnosed with severe aortic stenosis, developed sustained V. tach and was eventually transferred to the ICU after CPR was initiated.  She was extubated earlier and remains n.p.o. since extubation.  Significant musculoskeletal pain asking for pain medications.  Allergy to fentanyl with swelling.  Intolerance of Vicodin due to anxiety.  Tolerates oxycodone.  Renal function intact.  eICU Interventions  Morphine sliding scale added.  Could escalate with Robaxin/multimodal pain control   [phone] - says she feels "funny", intermittent PVCs, coronary angiogram 8/15 with no defects and no perivalvular leak. Vitals stable, CVP/PAP mildly increased. ECG with inferior ST elevations and worsening RV strain pattern. Seems unlikely she's having ACS, but will discuss w/ HF team  Intervention Category Intermediate Interventions: Pain - evaluation and management  Monti Jilek 02/08/2023, 12:13 AM

## 2023-02-08 NOTE — Evaluation (Signed)
Clinical/Bedside Swallow Evaluation Patient Details  Name: Madison West MRN: [id number] Date of Birth: January 16, 1956  Today's Date: 02/08/2023 Time: SLP Start Time (ACUTE ONLY): 4696 SLP Stop Time (ACUTE ONLY): 0912 SLP Time Calculation (min) (ACUTE ONLY): 19 min  Past Medical History:  Past Medical History:  Diagnosis Date   Anxiety    Arthritis    low back and hip pain intermittent   Breast cancer (HCC) 06/02/07   r breast -surgery ,radiaology. chemotherapy   Carpal tunnel syndrome    right hand   Colon polyps    Complication of anesthesia    Fentanyl, Versed-makes extra hyper, bradycardia x 1 in PACU, [facility] (08/15/11 cardiology felt neostigmine may have resulted in AV nodal block)    Coronary artery disease    Depression    denies   Dysplasia of vulva    Hypertension    Palpitations    PSVT, s/p adenosine 08/04/16   S/P breast lumpectomy 07/04/07   R breast   S/P radiation therapy 2009   Past Surgical History:  Past Surgical History:  Procedure Laterality Date   APPENDECTOMY     age 23   CESAREAN SECTION     x 2   COLONOSCOPY W/ POLYPECTOMY     COLONOSCOPY WITH PROPOFOL N/A 04/27/2016   Procedure: COLONOSCOPY WITH PROPOFOL;  Surgeon: Charolett Bumpers, MD;  Location: [facility] ENDOSCOPY;  Service: Endoscopy;  Laterality: N/A;   CORONARY STENT INTERVENTION N/A 01/29/2023   Procedure: CORONARY STENT INTERVENTION;  Surgeon: Elder Negus, MD;  Location: [facility] INVASIVE CV LAB;  Service: Cardiovascular;  Laterality: N/A;   CORONARY THROMBECTOMY N/A 01/31/2023   Procedure: Coronary Thrombectomy;  Surgeon: Elder Negus, MD;  Location: [facility] INVASIVE CV LAB;  Service: Cardiovascular;  Laterality: N/A;   CORONARY ULTRASOUND/IVUS N/A 01/31/2023   Procedure: Coronary Ultrasound/IVUS;  Surgeon: Elder Negus, MD;  Location: [facility] INVASIVE CV LAB;  Service: Cardiovascular;  Laterality: N/A;   CORONARY/GRAFT ACUTE MI REVASCULARIZATION N/A 01/31/2023   Procedure: Coronary/Graft  Acute MI Revascularization;  Surgeon: Elder Negus, MD;  Location: [facility] INVASIVE CV LAB;  Service: Cardiovascular;  Laterality: N/A;   DILATION AND CURETTAGE OF UTERUS     multiple   ENDARTERECTOMY Right 06/09/2019   ENDARTERECTOMY Right 06/09/2019   Procedure: ENDARTERECTOMY CAROTID RIGHT;  Surgeon: Sherren Kerns, MD;  Location: [facility] OR;  Service: Vascular;  Laterality: Right;   IABP INSERTION N/A 02/02/2023   Procedure: IABP Insertion;  Surgeon: Elder Negus, MD;  Location: [facility] INVASIVE CV LAB;  Service: Cardiovascular;  Laterality: N/A;   INTRAUTERINE DEVICE INSERTION     IUD REMOVAL     LEFT HEART CATH AND CORONARY ANGIOGRAPHY N/A 02/27/2020   Procedure: LEFT HEART CATH AND CORONARY ANGIOGRAPHY;  Surgeon: Yates Decamp, MD;  Location: [facility] INVASIVE CV LAB;  Service: Cardiovascular;  Laterality: N/A;   LEFT HEART CATH AND CORONARY ANGIOGRAPHY N/A 01/31/2023   Procedure: LEFT HEART CATH AND CORONARY ANGIOGRAPHY;  Surgeon: Elder Negus, MD;  Location: [facility] INVASIVE CV LAB;  Service: Cardiovascular;  Laterality: N/A;   MASTECTOMY PARTIAL / LUMPECTOMY W/ AXILLARY LYMPHADENECTOMY Right    lumpectomy and lymph nodes removed   PATCH ANGIOPLASTY Right 06/09/2019   Procedure: Patch Angioplasty of right carotid artery using hemashield paltinum finesse patch;  Surgeon: Sherren Kerns, MD;  Location: [facility] OR;  Service: Vascular;  Laterality: Right;   RIGHT AND LEFT HEART CATH N/A 02/01/2023   Procedure: RIGHT AND LEFT HEART CATH;  Surgeon: Truett Mainland  J, MD;  Location: [facility] INVAS[facility] CV LAB;  Service: Cardiovascular;  Laterality: N/A;   RIGHT HEART CATH N/A 02/02/2023   Procedure: RIGHT HEART CATH;  Surgeon: Dolores Patty, MD;  Location: [facility] CV LAB;  Service: Cardiovascular;  Laterality: N/A;   RIGHT/LEFT HEART CATH AND CORONARY ANGIOGRAPHY N/A 01/27/2023   Procedure: RIGHT/LEFT HEART CATH AND CORONARY ANGIOGRAPHY;  Surgeon: Elder Negus, MD;  Location: [facility] INVASIVE CV  LAB;  Service: Cardiovascular;  Laterality: N/A;   TEE WITHOUT CARDIOVERSION N/A 02/04/2023   Procedure: TRANSESOPHAGEAL ECHOCARDIOGRAM;  Surgeon: Orbie Pyo, MD;  Location: [facility] INVASIVE CV LAB;  Service: Open Heart Surgery;  Laterality: N/A;   TEMPORARY PACEMAKER N/A 02/02/2023   Procedure: TEMPORARY PACEMAKER;  Surgeon: Dolores Patty, MD;  Location: [facility] INVASIVE CV LAB;  Service: Cardiovascular;  Laterality: N/A;   TRANSCATHETER AORTIC VALVE REPLACEMENT, TRANSFEMORAL N/A 02/04/2023   Procedure: Transcatheter Aortic Valve Replacement, Transfemoral;  Surgeon: Orbie Pyo, MD;  Location: [facility] INVASIVE CV LAB;  Service: Open Heart Surgery;  Laterality: N/A;   TUBAL LIGATION     VULVA SURGERY     Multiple times for dysplasia   HPI:  Madison West is a 67 yo female presenting to ED 8/4 c/o of shortness of breath, weight gain, and generalized weakness, admitted with acute CHF. S/p LHC 8/11 with mid LAD 95% lesion, stent placed. VTACH arrest x2 8/13 requiring multiple shocks for ROSC; found to have NSTEMI. S/p IABP placement 8/13-8/17. S/p TAVR 8/15. Intubated 8/13-8/18. CXR 8/15 with moderate L pleural effusion increased in volume and resolution of previous R base atelectasis. PMH inlcudes PSVT, anxiety/depression, breast cancer s/p lumpectomy, chemo and radiation 2009, HTN, HLD, R caroitd artery stenosis s/p carotid endarterectomy (2020), CAD s/p stenting, mild to moderate aortic stenosis, GERD    Assessment / Plan / Recommendation  Clinical Impression  Pt reports feeling nervous today after extubation. She is currently on 15L/min HFNC with SpO2 remaining stable ~95%. Pt presents with a breathy and low quality of voice, which could be suggestive of vocal fold dysfunction following prolonged intubation. SLP repositioned pt upright which prompted a weak yet congested cough with pt able to expectorate secretions using Yankauer. Oral motor exam overall WFL. Pt observed with trials of ice chips  x2, thin liquids via straw, and purees via teaspoon. Each bolus required multiple swallows to clear. Pt with an immediate but unproductive cough following single sip of thin liquids. Pt effectively able to clear purees from oral cavity. She had a delayed throat clear x1 following all PO trials. Plan for instrumental swallow study as able to further evaluate vocal fold function and safe diet recommendations. Recommend pt remain NPO except for essential meds, which should be crushed in puree with frequent cueing to clear her throat after each swallow. Thorough oral care should be provided prior to med administration as well as frequently throughout the day. Will continue to follow.  SLP Visit Diagnosis: Dysphagia, unspecified (R13.10);Aphonia (R49.1)    Aspiration Risk  Moderate aspiration risk    Diet Recommendation NPO except meds    Medication Administration: Crushed with puree Supervision: Full supervision/cueing for compensatory strategies Compensations: Clear throat after each swallow Postural Changes: Seated upright at 90 degrees;Remain upright for at least 30 minutes after po intake    Other  Recommendations Oral Care Recommendations: Oral care QID;Oral care before and after PO;Staff/trained caregiver to provide oral care    Recommendations for follow up therapy are one component of a multi-disciplinary  discharge planning process, led by the attending physician.  Recommendations may be updated based on patient status, additional functional criteria and insurance authorization.  Follow up Recommendations Acute inpatient rehab (3hours/day)      Assistance Recommended at Discharge    Functional Status Assessment Patient has had a recent decline in their functional status and demonstrates the ability to make significant improvements in function in a reasonable and predictable amount of time.  Frequency and Duration min 2x/week  2 weeks       Prognosis Prognosis for improved  oropharyngeal function: Good Barriers to Reach Goals: Cognitive deficits;Time post onset      Swallow Study   General HPI: Madison West is a 67 yo female presenting to ED 8/4 c/o of shortness of breath, weight gain, and generalized weakness, admitted with acute CHF. S/p LHC 8/11 with mid LAD 95% lesion, stent placed. VTACH arrest x2 8/13 requiring multiple shocks for ROSC; found to have NSTEMI. S/p IABP placement 8/13-8/17. S/p TAVR 8/15. Intubated 8/13-8/18. CXR 8/15 with moderate L pleural effusion increased in volume and resolution of previous R base atelectasis. PMH inlcudes PSVT, anxiety/depression, breast cancer s/p lumpectomy, chemo and radiation 2009, HTN, HLD, R caroitd artery stenosis s/p carotid endarterectomy (2020), CAD s/p stenting, mild to moderate aortic stenosis, GERD Type of Study: Bedside Swallow Evaluation Previous Swallow Assessment: none in chart Diet Prior to this Study: NPO Temperature Spikes Noted: No Respiratory Status: Nasal cannula (15 L/min HFNC) History of Recent Intubation: Yes Total duration of intubation (days): 5 days Date extubated: 02/07/23 Behavior/Cognition: Alert;Cooperative Oral Cavity Assessment: Within Functional Limits Oral Care Completed by SLP: No Oral Cavity - Dentition: Adequate natural dentition Vision: Functional for self-feeding Self-Feeding Abilities: Total assist Patient Positioning: Upright in bed Baseline Vocal Quality: Hoarse;Low vocal intensity;Breathy Volitional Cough: Weak Volitional Swallow: Able to elicit    Oral/Motor/Sensory Function Overall Oral Motor/Sensory Function: Within functional limits   Ice Chips Ice chips: Within functional limits Presentation: Spoon   Thin Liquid Thin Liquid: Impaired Presentation: Straw Pharyngeal  Phase Impairments: Cough - Immediate    Nectar Thick Nectar Thick Liquid: Not tested   Honey Thick Honey Thick Liquid: Not tested   Puree Puree: Within functional limits Presentation: Spoon    Solid     Solid: Not tested     Gwynneth Aliment, M.A., CF-SLP Speech Language Pathology, Acute Rehabilitation Services  Secure Chat preferred [phone]  02/08/2023,9:30 AM

## 2023-02-08 NOTE — Progress Notes (Signed)
NAME:  Madison West, MRN:  [id number], DOB:  1955-07-19, LOS: 15 ADMISSION DATE:  01/23/2023, CONSULTATION DATE:  02/08/2023  REFERRING MD:  TRH, pokhrel, CHIEF COMPLAINT:  VT arrest   History of Present Illness:  67 yo F w/ pertinent PMH PSVT, HTN, HLD, R carotid artery stenosis s/p endarterectomy 2020, CAD s/p stenting 2021, moderate aortic stenosis presents to [facility] ED on 8/9 w/ SOB.   Patient has been having SOB that has been getting progressively worse. Patient states she has been gaining weight and feels fatigued. Patient also having some anginal chest pain similar to when she needed coronary interventions back in 2021. Patient admitted to [facility] on 8/9 and treated for new onset CHF. Echo showing LVEF 30-35%; regional wall motion abnormalities; moderate aortic stenosis. BNP 462. EKG showing PVC, LAE, LAFB w/ miniaml ST elevation. Patient underwent cardiac cath on 8.7 showing mid LAD 95% lesion w/ LAD stent placement. CT surgery consulted for severe aortic stenosis. Patient has been having episodes of non sustained v tach started on metoprolol.    On 8/11, patient on floors and had vtach w/ no pulse. CPR initiated, given epi, and shocked x2 with ROSC achieved. Patient alert after cpr event but slightly confused. Patient again went into Vtach rhythm and was shocked again. Given amio, mag, calcium. Patient started on amio drip. Patient transferred to [facility] ICU and cardiology consulted. EKG showing ST elevation in anterior leads. Code STEMI. PCCM consulted.  Pertinent  Medical History  PSVT, anxiety/depression,  breast cancer status post lumpectomy, chemo and radiation in 2009, hypertension,  hyperlipidemia,  right carotid artery stenosis status post carotid endarterectomy in 2020, CAD status post stent to LAD 8/7  Significant Hospital Events: Including procedures, antibiotic start and stop dates in addition to other pertinent events   01/31/2023 for left heart catheterization intervention on the  mid LAD. 8/13 overnight patient developed refractory VT shocked multiple times taken back to Cath Lab, intra-aortic balloon pump placed.  Stabilized intolerant of inotropes due to recurrent VT on amiodarone lidocaine and Neo-Synephrine. Was taken for consideration of VAECMO and then the decision was to not cannulate 8/15 plans for TAVR today  8/16 doing better, weaning pressors, remains intubated, IABP in place 8/17 remains on IABP 8/18 extubation   Interim History / Subjective:  Very anxious Good diuresis Lots of chest discomfort reproducible with palpation  Objective   Blood pressure (!) 162/60, pulse 82, temperature 98.6 F (37 C), temperature source Oral, resp. rate (!) 27, height 5\' 8"  (1.727 m), weight 101.2 kg, SpO2 95%. PAP: (16-44)/(9-33) 27/17 CVP:  [3 mmHg-17 mmHg] 9 mmHg CO:  [7.5 L/min-9.3 L/min] 7.5 L/min CI:  [3.8 L/min/m2-4.7 L/min/m2] 3.8 L/min/m2  Vent Mode: PSV;CPAP FiO2 (%):  [50 %] 50 % Pressure Support:  [10 cmH20] 10 cmH20   Intake/Output Summary (Last 24 hours) at 02/08/2023 0758 Last data filed at 02/08/2023 0700 Gross per 24 hour  Intake 2444.68 ml  Output 7285 ml  Net -4840.32 ml   Filed Weights   02/06/23 0309 02/07/23 0500 02/08/23 0645  Weight: 104.8 kg 103.1 kg 101.2 kg    Examination: Anxious woman in mild distress Chest wall tenderness to palpation Ext warm Minimal anasarca Aox3 Anxious Globally weak  Coox 68% CBG okay No chest imaging CBG ok  Resolved Hospital Problem list     Assessment & Plan:   Refractory VT, cardiogenic shock VT arrest in the setting of recent LAD stent, EKG showing anterior wall STEMI, likely in-stent restenosis Acute  coronary syndrome, taken to the Cath Lab with successful percutaneous intervention on the mid LAD Chest wall pain, s/p CPR  Acute systolic heart failure Off pressors ASA/brillinta Inotrope management per HF team Graded pain management as ordered; trying some robaxin  Fevers Tracheal  aspirate with Enterobacter cloaca Plan: Pan sensititve  Cefepime x 7 days Fever curve looks a little better  Severe AS - s/p TAVR   Acute hypoxic respiratory failure, resolved - encourage IS  Hypertension -holding losartan and amlodipine  Severe anxiety -on BuSpar and Xanax - hold precedex, increase buspar and switch to home xanax once cleared by SLP  Hypokalemia- replete and check again this evening  Hyperglycemia  Cbgs with ssi    Best Practice (right click and "Reselect all SmartList Selections" daily)   Diet/type: pending SLP eval: imminent DVT prophylaxis: heparin ppx GI prophylaxis: N/A Lines: N/A Foley:  N/A Code Status:  full code Last date of multidisciplinary goals of care discussion: continue to update daughters   Myrla Halsted MD PCCM

## 2023-02-09 DIAGNOSIS — R570 Cardiogenic shock: Secondary | ICD-10-CM | POA: Diagnosis not present

## 2023-02-09 LAB — BASIC METABOLIC PANEL
Anion gap: 9 (ref 5–15)
BUN: 19 mg/dL (ref 8–23)
CO2: 28 mmol/L (ref 22–32)
Calcium: 8.7 mg/dL — ABNORMAL LOW (ref 8.9–10.3)
Chloride: 99 mmol/L (ref 98–111)
Creatinine, Ser: 0.83 mg/dL (ref 0.44–1.00)
GFR, Estimated: >60 mL/min (ref >60)
Glucose, Bld: 114 mg/dL — ABNORMAL HIGH (ref 70–99)
Potassium: 3.8 mmol/L (ref 3.5–5.1)
Sodium: 136 mmol/L (ref 135–145)

## 2023-02-09 LAB — COOXEMETRY PANEL
Carboxyhemoglobin: 1.9 % — ABNORMAL HIGH (ref 0.5–1.5)
Methemoglobin: <0.7 % (ref 0.0–1.5)
O2 Saturation: 72.1 %
Total hemoglobin: 9.4 g/dL — ABNORMAL LOW (ref 12.0–16.0)

## 2023-02-09 LAB — CBC
HCT: 27.7 % — ABNORMAL LOW (ref 36.0–46.0)
Hemoglobin: 9.3 g/dL — ABNORMAL LOW (ref 12.0–15.0)
MCH: 32.5 pg (ref 26.0–34.0)
MCHC: 33.6 g/dL (ref 30.0–36.0)
MCV: 96.9 fL (ref 80.0–100.0)
Platelets: 309 10*3/uL (ref 150–400)
RBC: 2.86 MIL/uL — ABNORMAL LOW (ref 3.87–5.11)
RDW: 14 % (ref 11.5–15.5)
WBC: 9.3 10*3/uL (ref 4.0–10.5)
nRBC: 0 % (ref 0.0–0.2)

## 2023-02-09 LAB — GLUCOSE, CAPILLARY
Glucose-Capillary: 104 mg/dL — ABNORMAL HIGH (ref 70–99)
Glucose-Capillary: 118 mg/dL — ABNORMAL HIGH (ref 70–99)
Glucose-Capillary: 118 mg/dL — ABNORMAL HIGH (ref 70–99)
Glucose-Capillary: 120 mg/dL — ABNORMAL HIGH (ref 70–99)
Glucose-Capillary: 121 mg/dL — ABNORMAL HIGH (ref 70–99)
Glucose-Capillary: 140 mg/dL — ABNORMAL HIGH (ref 70–99)

## 2023-02-09 LAB — MAGNESIUM: Magnesium: 2.5 mg/dL — ABNORMAL HIGH (ref 1.7–2.4)

## 2023-02-09 MED ORDER — HYDROMORPHONE HCL 1 MG/ML IJ SOLN
1.0000 mg | INTRAMUSCULAR | Status: DC | PRN
  Administered 2023-02-09 – 2023-02-14 (×4): 1 mg via INTRAVENOUS
  Filled 2023-02-09 (×4): qty 1

## 2023-02-09 MED ORDER — GABAPENTIN 300 MG PO CAPS
300.0000 mg | ORAL_CAPSULE | Freq: Three times a day (TID) | ORAL | Status: DC
  Administered 2023-02-09 – 2023-02-18 (×28): 300 mg via ORAL
  Filled 2023-02-09 (×28): qty 1

## 2023-02-09 MED ORDER — FLUOXETINE HCL 20 MG PO CAPS
20.0000 mg | ORAL_CAPSULE | Freq: Every day | ORAL | Status: DC
  Administered 2023-02-11 – 2023-02-13 (×2): 20 mg via ORAL
  Filled 2023-02-09 (×6): qty 1

## 2023-02-09 MED ORDER — SPIRONOLACTONE 12.5 MG HALF TABLET
12.5000 mg | ORAL_TABLET | Freq: Every day | ORAL | Status: DC
  Administered 2023-02-09 – 2023-02-10 (×2): 12.5 mg via ORAL
  Filled 2023-02-09 (×2): qty 1

## 2023-02-09 MED ORDER — ENSURE ENLIVE PO LIQD
237.0000 mL | Freq: Two times a day (BID) | ORAL | Status: DC
  Administered 2023-02-09 – 2023-02-18 (×13): 237 mL via ORAL

## 2023-02-09 MED ORDER — OXYMETAZOLINE HCL 0.05 % NA SOLN
1.0000 | Freq: Two times a day (BID) | NASAL | Status: AC
  Administered 2023-02-09 – 2023-02-11 (×4): 1 via NASAL
  Filled 2023-02-09: qty 30

## 2023-02-09 MED ORDER — AMIODARONE HCL 200 MG PO TABS
400.0000 mg | ORAL_TABLET | Freq: Two times a day (BID) | ORAL | Status: DC
  Administered 2023-02-09 – 2023-02-15 (×13): 400 mg via ORAL
  Filled 2023-02-09 (×13): qty 2

## 2023-02-09 MED ORDER — POTASSIUM CHLORIDE CRYS ER 20 MEQ PO TBCR
40.0000 meq | EXTENDED_RELEASE_TABLET | Freq: Once | ORAL | Status: AC
  Administered 2023-02-09: 40 meq via ORAL
  Filled 2023-02-09: qty 2

## 2023-02-09 MED ORDER — LOSARTAN POTASSIUM 25 MG PO TABS
12.5000 mg | ORAL_TABLET | Freq: Every day | ORAL | Status: DC
  Administered 2023-02-09 – 2023-02-10 (×2): 12.5 mg via ORAL
  Filled 2023-02-09 (×2): qty 1

## 2023-02-09 NOTE — Plan of Care (Signed)

## 2023-02-09 NOTE — Progress Notes (Signed)
Patient ID: [name], female   DOB: [birth year], 67 y.o.   MRN: [id number]     Advanced Heart Failure Rounding Note  PCP-Cardiologist: None   Subjective:    - 8/15 s/p TAVR. Coronary angiogram w/ widely patent stent. Post op echo w/ no paravalvular leak. - Echo (8/16): EF 35-40%, mild LVH, WMAs, RV normal, bioprosthetic aortic valve s/p TAVR with mean gradient 13 mmH, trivial PVL, IVC mildly dilated.  - IABP removed 8/17 - extubated 8/18  On milrinone 0.375. Lasix gtt stopped yesterday and transitioned to PO. Weight down 5lbs.   Co-ox 72%  No further VT on amiodarone gtt.   Afebrile.  PNA, Enterobacter cloacae on trach aspirate culture.  Remains on Cefepime.    Working with PT this morning, able to get to chair. Remains anxious.   Objective:   Weight Range: 99.1 kg Body mass index is 33.22 kg/m.   Vital Signs:   Temp:  [97.6 F (36.4 C)-98.4 F (36.9 C)] 98.3 F (36.8 C) (08/20 0850) Pulse Rate:  [70-97] 89 (08/20 0630) Resp:  [17-30] 29 (08/20 0630) BP: (107-170)/(58-79) 146/65 (08/20 0630) SpO2:  [88 %-99 %] 92 % (08/20 0630) Arterial Line BP: (111-180)/(44-74) 152/60 (08/20 0630) Weight:  [99.1 kg] 99.1 kg (08/20 0630) Last BM Date : 02/01/23  Weight change: Filed Weights   02/07/23 0500 02/08/23 0645 02/09/23 0630  Weight: 103.1 kg 101.2 kg 99.1 kg    Intake/Output:   Intake/Output Summary (Last 24 hours) at 02/09/2023 0859 Last data filed at 02/09/2023 0800 Gross per 24 hour  Intake 1641.64 ml  Output 1515 ml  Net 126.64 ml    Physical Exam  General:  well appearing.  No respiratory difficulty HEENT: normal Neck: supple. JVD ~5 cm. Carotids 2+ bilat; no bruits. No lymphadenopathy or thyromegaly appreciated. Cor: PMI nondisplaced. Regular rate & rhythm. No rubs, gallops or murmurs. Lungs: clear Abdomen: soft, nontender, nondistended. No hepatosplenomegaly. No bruits or masses. Good bowel sounds. Extremities: no cyanosis, clubbing, rash,  edema. PICC LUE. L radial a line  Neuro: alert & oriented x 3, cranial nerves grossly intact. moves all 4 extremities w/o difficulty. Affect anxious.  Telemetry   NSR 90s x1 short NSVT  (Personally reviewed)    Labs    CBC Recent Labs    02/08/23 0156 02/09/23 0404  WBC 9.4 9.3  HGB 9.1* 9.3*  HCT 27.0* 27.7*  MCV 93.1 96.9  PLT 280 309   Basic Metabolic Panel Recent Labs    78/29/56 0230 02/08/23 0544 02/08/23 1600 02/09/23 0404  NA  --   --  135 136  K  --    < > 3.3* 3.8  CL  --   --  95* 99  CO2  --   --  28 28  GLUCOSE  --   --  121* 114*  BUN  --   --  18 19  CREATININE  --   --  0.82 0.83  CALCIUM  --   --  8.3* 8.7*  MG 1.9  --   --  2.5*   < > = values in this interval not displayed.   Liver Function Tests No results for input(s): "AST", "ALT", "ALKPHOS", "BILITOT", "PROT", "ALBUMIN" in the last 72 hours.  No results for input(s): "LIPASE", "AMYLASE" in the last 72 hours. Cardiac Enzymes No results for input(s): "CKTOTAL", "CKMB", "CKMBINDEX", "TROPONINI" in the last 72 hours.  BNP: BNP (last 3 results) Recent Labs    01/23/23 2208  BNP 462.3*   ProBNP (last 3 results) No results for input(s): "PROBNP" in the last 8760 hours.  D-Dimer No results for input(s): "DDIMER" in the last 72 hours. Hemoglobin A1C No results for input(s): "HGBA1C" in the last 72 hours.  Fasting Lipid Panel No results for input(s): "CHOL", "HDL", "LDLCALC", "TRIG", "CHOLHDL", "LDLDIRECT" in the last 72 hours.  Thyroid Function Tests No results for input(s): "TSH", "T4TOTAL", "T3FREE", "THYROIDAB" in the last 72 hours.  Invalid input(s): "FREET3"  Other results:  Imaging    [place] EKG SITE RITE  Result Date: 02/08/2023 If Site Rite image not attached, placement could not be confirmed due to current cardiac rhythm.    Medications:   Scheduled Medications:  aspirin  81 mg Oral Daily   busPIRone  10 mg Oral BID   Chlorhexidine Gluconate Cloth  6 each Topical  Daily   docusate  100 mg Oral BID   famotidine  20 mg Oral BID   furosemide  40 mg Oral Daily   heparin injection (subcutaneous)  5,000 Units Subcutaneous Q8H   insulin aspart  2-6 Units Subcutaneous Q4H   mouth rinse  15 mL Mouth Rinse 4 times per day   polyethylene glycol  17 g Oral BID   rosuvastatin  20 mg Oral Daily   sodium chloride flush  10-40 mL Intracatheter Q12H   sodium chloride flush  3 mL Intravenous Q12H   sodium chloride flush  3 mL Intravenous Q12H   ticagrelor  90 mg Oral BID    Infusions:  sodium chloride     sodium chloride Stopped (02/08/23 0416)   sodium chloride     sodium chloride     sodium chloride     albumin human Stopped (02/03/23 1032)   amiodarone 30 mg/hr (02/09/23 0800)   ceFEPime (MAXIPIME) IV Stopped (02/09/23 0235)   methocarbamol (ROBAXIN) IV Stopped (02/08/23 2346)   milrinone 0.375 mcg/kg/min (02/09/23 0800)   norepinephrine (LEVOPHED) Adult infusion Stopped (02/08/23 0503)   sodium chloride      PRN Medications: sodium chloride, sodium chloride, Place/Maintain arterial line **AND** sodium chloride, sodium chloride, acetaminophen, albumin human, ALPRAZolam, alum & mag hydroxide-simeth, bisacodyl, calcium carbonate, hydrALAZINE, lidocaine, lidocaine, LORazepam, morphine injection **OR** morphine injection, nitroGLYCERIN, ondansetron (ZOFRAN) IV, mouth rinse, oxyCODONE, sodium chloride flush, sodium chloride flush, sodium chloride flush  Patient Profile  67 y.o. female with history of CAD s/p PCI to RCA in 2021, carotid artery stenosis s/p R CEA, aortic valve stenosis. Admitted with unstable angina. Found to have severe LAD stenosis s/p PCI and severe low flow low gradient AS. Coarse c/b refractory VT and hemodynamic instability >> cardiogenic shock. Assessment/Plan  Acute systolic CHF >> cardiogenic shock - In setting of severe ischemic heart disease, reduced EF, severe AS and refractory VT/VF - Echo 01/25/23: EF 30-35%, septum and apex HK,  RV low normal, low flow low graident severe AS with mean gradient 20 mmHg, AVA 0.93 cm2, DI 0.33 - echo 02/01/23: EF 20-25%, RV okay, severe low flow low gradient severe AS with mean gradient 25 mmHg, AVA 1.1 cm2 - s/p PCI/stent to LAD. Stent thrombosis on 08/09 treated with thrombectomy.  - IABP placed 08/13. Had considered VA ECMO but hemodynamics improved with inotrope support - S/p TAVR 8/15 - Echo 8/16 with EF 35-40%, mild LVH, WMAs, RV normal, bioprosthetic aortic valve s/p TAVR with mean gradient 13 mmH, trivial PVL, IVC mildly dilated.  - IABP removed 8/17 - Continues on 0.375 milrinone. Now off NE. Co-ox 68%. Wean  milrinone to 0.25 - CVP unhooked, lasix gtt stopped yesterday and transitioned to 40 mg PO lasix. Weight down 5lbs.  - Plan to start SGLT2i tomorrow. Remove foley today.  - Start spiro 12.5 mg daily today - No BB for now with low output - Start losartan 12.5 mg daily  2. Refractory VT - S/p multiple shocks.  - No further VT over the last day  - Transition amiodarone gtt to po, 400 mg BID  - Replace K/Mg as needed - K 3.8   3. CAD NSTEMI -HX PCI to RCA in 2021 -Presented with unstable angina. S/p PCI/stent to LAD on 08/07. Stent thrombosis 08/09 treated with thrombectomy -HS troponin > 24K on 08/12 -LHC 08/12 with patent LAD stent -LAD appeared widely patent on TAVR angiogram -Continue DAPT and statin.    4. LFLG Severe AS -Noted on echo this admit -Not candidate for SAVR -s/p TAVR 8/15, valve stable on 8/15 echo with trivial PVL and low gradient.   5. Carotid artery stenosis -S/p R CEA in 2020  6. ID -Developed sepsis 08/13. Now on cefepime + vancomycin -Fever resolved.  -Enterobacter cloacae in trach aspirate.   7. Respiratory failure -Extubated 8/18, stable on HFNC  8. Hyponatremia -suspected hypervolemic hyponatremia -Na improved with diuresis, 136 today   Alen Bleacher, NP  8:59 AM

## 2023-02-09 NOTE — Progress Notes (Signed)
Physical Therapy Treatment Patient Details Name: Madison West MRN: [id number] DOB: 08/03/1955 Today's Date: 02/09/2023   History of Present Illness Pt is a 67 y.o. female admitted 01/24/23 with acute CHF. S/p LHC 8/11 with mid LAD 95% lesion, stent placed. VTACH arrest x2 on 8/13 requiring CPR and multiple shocks for ROSC; found to have NSTEMI. S/p IABP placement 8/13-8/17. S/p TAVR 8/15. ETT 8/13-8/18. PMH includes PSVT, CAD, HTN, HLD, breast CA s/p lumpectomy, chemo, anxiety, depression.    PT Comments  Pt with significant functional improvement this date. Pt able to transfer OOB to [facility] and then to recliner. Pt remains very anxious, deconditioned, generalized weakness, decreased activity tolerance, and requires increased assist with all mobility. Current d/c recommendations remain appropriate. Acute PT to cont to follow.    If plan is discharge home, recommend the following: A lot of help with walking and/or transfers;A lot of help with bathing/dressing/bathroom;Assistance with cooking/housework;Assist for transportation;Help with stairs or ramp for entrance   Can travel by private vehicle        Equipment Recommendations  Other (comment) (TBD)    Recommendations for Other Services       Precautions / Restrictions Precautions Precautions: Fall;Other (comment) Precaution Comments: multiple lines/leads, high anxiety Restrictions Weight Bearing Restrictions: No RLE Weight Bearing: Non weight bearing (per pt)     Mobility  Bed Mobility Overal bed mobility: Needs Assistance Bed Mobility: Supine to Sit     Supine to sit: Max assist     General bed mobility comments: HOB elevated, with max encouragement pt able to bring LEs to EOB with increased time, maxA for trunk elevation and then to scoot hips to EOB    Transfers Overall transfer level: Needs assistance Equipment used: 2 person hand held assist (face to face transfer with gait belt and bed pad) Transfers: Sit to/from  Stand, Bed to chair/wheelchair/BSC Sit to Stand: Mod assist, +2 physical assistance   Step pivot transfers: Mod assist, +2 physical assistance       General transfer comment: pt with good power up from EOB and then again from [facility]. Pt required max directional verbal cues to sequence stepping to chair, pt with limited use of UEs functionally due to "they told me not to use my R arm at all." completed step pvt to [facility] and then to recliner    Ambulation/Gait               General Gait Details: limited to 4 steps x2 set during step pvt this date   Stairs             Wheelchair Mobility     Tilt Bed    Modified Rankin (Stroke Patients Only)       Balance Overall balance assessment: Needs assistance Sitting-balance support: Single extremity supported, Feet supported Sitting balance-Leahy Scale: Fair Sitting balance - Comments: pt able to maintain EOB sitting without posterior support this date and close contact guard   Standing balance support: During functional activity, Bilateral upper extremity supported, Reliant on assistive device for balance Standing balance-Leahy Scale: Poor Standing balance comment: requires external assist                            Cognition Arousal: Alert Behavior During Therapy: Flat affect, Anxious Overall Cognitive Status: Within Functional Limits for tasks assessed  General Comments: pt with known high anxiety and is on xanax which was started yesterday which inhibits the ability to progress mobility, pt was able to follow commands with increased time and answer questions appropriately        Exercises General Exercises - Lower Extremity Ankle Circles/Pumps: AROM, Both, 10 reps, Seated Long Arc Quad: AROM, Both, Seated, 10 reps (chair position) Hip Flexion/Marching: AROM, Both, Seated, 10 reps (chair position)    General Comments General comments (skin integrity, edema,  etc.): VSS      Pertinent Vitals/Pain Pain Assessment Pain Assessment: Faces Faces Pain Scale: Hurts little more Pain Location: back Pain Descriptors / Indicators: Discomfort, Sore Pain Intervention(s): Monitored during session    Home Living                          Prior Function            PT Goals (current goals can now be found in the care plan section) Acute Rehab PT Goals PT Goal Formulation: With patient Time For Goal Achievement: 02/21/23 Potential to Achieve Goals: Good Progress towards PT goals: Progressing toward goals    Frequency    Min 1X/week      PT Plan      Co-evaluation              AM-PAC PT "6 Clicks" Mobility   Outcome Measure  Help needed turning from your back to your side while in a flat bed without using bedrails?: A Lot Help needed moving from lying on your back to sitting on the side of a flat bed without using bedrails?: A Lot Help needed moving to and from a bed to a chair (including a wheelchair)?: A Lot Help needed standing up from a chair using your arms (e.g., wheelchair or bedside chair)?: A Lot Help needed to walk in hospital room?: Total Help needed climbing 3-5 steps with a railing? : Total 6 Click Score: 10    End of Session Equipment Utilized During Treatment: Gait belt;Oxygen Activity Tolerance: Patient tolerated treatment well Patient left: with call bell/phone within reach;with nursing/sitter in room;in chair;with chair alarm set Nurse Communication: Mobility status (RN present and assisted with transfers) PT Visit Diagnosis: Other abnormalities of gait and mobility (R26.89);Muscle weakness (generalized) (M62.81)     Time: 1610-9604 PT Time Calculation (min) (ACUTE ONLY): 29 min  Charges:    $Therapeutic Activity: 23-37 mins PT General Charges $$ ACUTE PT VISIT: 1 Visit                     Lewis Shock, PT, DPT Acute Rehabilitation Services Secure chat preferred Office #:  [phone]    Iona Hansen 02/09/2023, 9:43 AM

## 2023-02-09 NOTE — Progress Notes (Signed)
NAME:  Madison West, MRN:  [id number], DOB:  04-Dec-1955, LOS: 16 ADMISSION DATE:  01/23/2023, CONSULTATION DATE:  02/09/2023 REFERRING MD:  Pokhrel - [facility] CHIEF COMPLAINT:  VT arrest   History of Present Illness:  67 year old woman who presented to [facility] ED 8/9 with SOB. PMHx significant for PSVT, HTN, HLD, R carotid artery stenosis s/p endarterectomy 2020, CAD (s/p stenting 2021), moderate aortic stenosis.  Patient presented with progressively worsening SOB, weight gain and fatigue. Also complaining of anginal chest pain similar to when she needed coronary intervention back in 2021. Admitted to [facility] 8/9 and treated for new onset CHF. Echo showing LVEF 30-35%; +RWMAs, moderate aortic stenosis. BNP 462. EKG showing PVCs, LAE, LAFB w/ minimal ST elevation. Patient underwent cardiac cath 8/7 showing mid LAD 95% lesion with LAD stent placement. CT surgery consulted for severe aortic stenosis. Patient has been having episodes of non sustained VT, started on metoprolol.    On 8/11, patient on floor and had pulseless VT. CPR initiated, given Epi, and shocked x 2 with ROSC. Patient alert after CPR event but slightly confused. Patient again went into VT and was shocked. Given amio, mag, calcium. Patient started on amio drip. Patient transferred to [facility] ICU and cardiology consulted. EKG showing ST elevation in anterior leads. Code STEMI. PCCM consulted.  Required IABP placement 8/13. Ultimately underwent TAVR 8/15.  Pertinent Medical History:  PSVT, anxiety/depression, breast cancer (s/p lumpectomy, chemo and radiation in 2009), HTN, HLD, right carotid artery stenosis status post carotid endarterectomy in 2020, CAD status post stent to LAD 8/7  Significant Hospital Events: Including procedures, antibiotic start and stop dates in addition to other pertinent events   01/31/2023 LHC intervention on the mid LAD. 8/13 Overnight patient developed refractory VT shocked multiple times taken back to Cath Lab,  intra-aortic balloon pump placed.  Stabilized intolerant of inotropes due to recurrent VT on amiodarone lidocaine and Neo-Synephrine. Was taken for consideration of VAECMO and then the decision was to not cannulate 8/15 Plans for TAVR today  8/16 Doing better, weaning pressors, remains intubated, IABP in place 8/17 IABP removed 8/18 Extubated 8/19 Swan removed, Lasix gtt stopped. PICC placed.  Interim History / Subjective:  No significant events overnight Soft-spoken, some conversational dyspnea Remains on HFNC Still incredibly anxious Compulsively blowing her nose until she is having blood-tinged mucous Afrin trial today Milrinone weaned by AHF Working with SLP  Objective:  Blood pressure (!) 146/65, pulse 89, temperature 97.6 F (36.4 C), temperature source Oral, resp. rate (!) 29, height 5\' 8"  (1.727 m), weight 99.1 kg, SpO2 92%. PAP: (33-49)/(21-31) 33/21 CVP:  [8 mmHg-15 mmHg] 8 mmHg      Intake/Output Summary (Last 24 hours) at 02/09/2023 0825 Last data filed at 02/09/2023 0800 Gross per 24 hour  Intake 1641.64 ml  Output 1515 ml  Net 126.64 ml   Filed Weights   02/07/23 0500 02/08/23 0645 02/09/23 0630  Weight: 103.1 kg 101.2 kg 99.1 kg   Physical Examination: General: Acute-on-chronically ill-appearing older woman in NAD. Up to chair, anxious. HEENT: [place]/AT, anicteric sclera, PERRL, moist mucous membranes. Blood-tinged nasal secretions from copious nasal blowing. Neuro: Awake, oriented x 4. Slightly lethargic. Responds to verbal stimuli. Following commands consistently. Moves all 4 extremities spontaneously. Generalized weakness. CV: RRR, no m/g/r. PULM: Breathing tachypneic and mildly labored on 12L [place]. Lung fields diminished throughout. GI: Soft, nontender, nondistended. Normoactive bowel sounds. Extremities: Trace symmetric BLE edema noted. Skin: Warm/dry, no rashes.  Resolved Hospital Problem List:  Assessment & Plan:   Refractory VT, cardiogenic  shock VT arrest in the setting of recent LAD stent, EKG showing anterior wall STEMI, likely in-stent restenosis Acute coronary syndrome taken to the Cath Lab with successful percutaneous intervention on the mid LAD Chest wall pain, s/p CPR  Acute systolic heart failure, IABP removed 8/17 Severe AS - S/p TAVR 8/15. - Goal MAP > 65 - Off of pressors at present - ASA/Brilinta, statin - Amiodarone gtt - AHF following, appreciate recommendations - Milrinone per AHF - Diuresis as tolerated, transitioned to PO Lasix 8/19  Fevers Tracheal aspirate with Enterobacter cloacae - Trend WBC, fever curve - F/u Cx data - Continue broad-spectrum antibiotics (cefepime x 7 days)  Acute hypoxic respiratory failure, resolved - Supplemental O2 support - Wean O2 for sat > 90% - VAP bundle - Pulmonary hygiene  Hypertension - Hold home antihypertensives at present  Severe anxiety - PTA on Buspar and Xanax - Continue Buspar - Xanax TID PRN (must be crushed in puree), Ativan IV if needed  Hypokalemia, likely in the setting of diuresis - Trend BMP - Replete electrolytes as indicated - Monitor I&Os  Hyperglycemia  - SSI - CBGs Q4H - Goal CBG 140-180  Rhinorrhea - Afrin trial x 3 days  Best Practice (right click and "Reselect all SmartList Selections" daily)   Diet/type: pending SLP eval: imminent DVT prophylaxis: heparin ppx GI prophylaxis: N/A Lines: N/A Foley:  N/A Code Status:  full code Last date of multidisciplinary goals of care discussion: continue to update daughters   Critical care time:   The patient is critically ill with multiple organ system failure and requires high complexity decision making for assessment and support, frequent evaluation and titration of therapies, advanced monitoring, review of radiographic studies and interpretation of complex data.   Critical Care Time devoted to patient care services, exclusive of separately billable procedures, described in this  note is 36 minutes.  Tim Lair, PA-C [facility] 02/09/23 8:29 AM  Please see [email or web address] for pager details.  From 7A-7P if no response, please call [phone] After hours, please call ELink [phone]

## 2023-02-09 NOTE — Progress Notes (Signed)
Nutrition Follow-up  DOCUMENTATION CODES:   Not applicable  INTERVENTION:   Ensure Enlive po BID, each supplement provides 350 kcal and 20 grams of protein.  If po intake inadequate, recommend liberalizing diet to REGULAR  +constipation with no BM since 8/12. Recommend considering increasing bowel regimen  NUTRITION DIAGNOSIS:   Inadequate oral intake related to acute illness as evidenced by NPO status.  Being addressed via diet advancement, supplements  GOAL:   Patient will meet greater than or equal to 90% of their needs  Progressing  MONITOR:   TF tolerance, Vent status, Labs, Weight trends  REASON FOR ASSESSMENT:   Ventilator    ASSESSMENT:   67 yo female admitted progressively worsening SOB and fatigue with weight gain and admitted with acute systolic failure. Pt with CODE STEMI post Vtach arrest, developed refractory VT requiring IABP, intubation. PMH includes HTN, HLD, CAD s/p stenting, moderate aprtic stenosis  8/04 Admitted 8/11 V-tach arrest, CPR initiated, Transferred to ICU, Code STEMI 8/13 Developed refractory VT, shocked multiple times, IABP. Considered for VA ECMO but decision made not to cannulate 8/14 trickle TF initiated 8/15 TAVR 8/16 Trickle TF initiated and titration towards goal 8/17 IABP removed 8/18 Extubated 8/20 Diet advanced to Heart Healthy  Pt sitting up in chair on visit today, diet advanced to Heart Healthy but pt has not received a meal tray yet. Pt does not feel very hungry which is to be expected, +pain.   Pt still anxious, very hard to hear when she is speaking (speaks softly)  +flatus but still no BM  Net negative 6L per I/O flow sheet  Admit wt: 95 kg Lowest wt: 93.1 kg Post-Op Wt 105 kg Current wt: 99.1 kg  Labs: BUn/Creatinine wdl, sodium wdl, potassium wdl Meds: lasix, ss novolog, miralax, colace BID, dulcolax prn    Diet Order:   Diet Order             Diet Heart Room service appropriate? Yes with Assist;  Fluid consistency: Thin  Diet effective now                   EDUCATION NEEDS:   Not appropriate for education at this time  Skin:  Skin Assessment: Reviewed RN Assessment  Last BM:  8/12 +constipation  Height:   Ht Readings from Last 1 Encounters:  01/24/23 5\' 8"  (1.727 m)    Weight:   Wt Readings from Last 1 Encounters:  02/09/23 99.1 kg    BMI:  Body mass index is 33.22 kg/m.  Estimated Nutritional Needs:   Kcal:  1800-2000 kcals  Protein:  90-105 g  Fluid:  1.8 L    Romelle Starcher MS, RDN, LDN, CNSC Registered Dietitian 3 Clinical Nutrition RD Pager and On-Call Pager Number Located in [place]

## 2023-02-09 NOTE — Procedures (Signed)
Objective Swallowing Evaluation: Type of Study: FEES-Fiberoptic Endoscopic Evaluation of Swallow   Patient Details  Name: Madison West MRN: [id number] Date of Birth: 08/23/1955  Today's Date: 02/09/2023 Time: SLP Start Time (ACUTE ONLY): 1000 -SLP Stop Time (ACUTE ONLY): 1110  SLP Time Calculation (min) (ACUTE ONLY): 70 min   Past Medical History:  Past Medical History:  Diagnosis Date   Anxiety    Arthritis    low back and hip pain intermittent   Breast cancer (HCC) 06/02/07   r breast -surgery ,radiaology. chemotherapy   Carpal tunnel syndrome    right hand   Colon polyps    Complication of anesthesia    Fentanyl, Versed-makes extra hyper, bradycardia x 1 in PACU, [facility] (08/15/11 cardiology felt neostigmine may have resulted in AV nodal block)    Coronary artery disease    Depression    denies   Dysplasia of vulva    Hypertension    Palpitations    PSVT, s/p adenosine 08/04/16   S/P breast lumpectomy 07/04/07   R breast   S/P radiation therapy 2009   Past Surgical History:  Past Surgical History:  Procedure Laterality Date   APPENDECTOMY     age 69   CESAREAN SECTION     x 2   COLONOSCOPY W/ POLYPECTOMY     COLONOSCOPY WITH PROPOFOL N/A 04/27/2016   Procedure: COLONOSCOPY WITH PROPOFOL;  Surgeon: Charolett Bumpers, MD;  Location: [facility];  Service: Endoscopy;  Laterality: N/A;   CORONARY STENT INTERVENTION N/A 01/29/2023   Procedure: CORONARY STENT INTERVENTION;  Surgeon: Elder Negus, MD;  Location: [facility];  Service: Cardiovascular;  Laterality: N/A;   CORONARY THROMBECTOMY N/A 01/31/2023   Procedure: Coronary Thrombectomy;  Surgeon: Elder Negus, MD;  Location: [facility];  Service: Cardiovascular;  Laterality: N/A;   CORONARY ULTRASOUND/IVUS N/A 01/31/2023   Procedure: Coronary Ultrasound/IVUS;  Surgeon: Elder Negus, MD;  Location: [facility];  Service: Cardiovascular;  Laterality: N/A;   CORONARY/GRAFT  ACUTE MI REVASCULARIZATION N/A 01/31/2023   Procedure: Coronary/Graft Acute MI Revascularization;  Surgeon: Elder Negus, MD;  [facility]: [facility];  Service: Cardiovascular;  Laterality: N/A;   DILATION AND CURETTAGE OF UTERUS     multiple   ENDARTERECTOMY Right 06/09/2019   ENDARTERECTOMY Right 06/09/2019   Procedure: ENDARTERECTOMY CAROTID RIGHT;  Surgeon: Sherren Kerns, MD;  Location: [facility] OR;  Service: Vascular;  Laterality: Right;   IABP INSERTION N/A 02/02/2023   Procedure: IABP Insertion;  Surgeon: Elder Negus, MD;  [facility];  Service: Cardiovascular;  Laterality: N/A;   INTRAUTERINE DEVICE INSERTION     IUD REMOVAL     LEFT HEART CATH AND CORONARY ANGIOGRAPHY N/A 02/27/2020   Procedure: LEFT HEART CATH AND CORONARY ANGIOGRAPHY;  Surgeon: Yates Decamp, MD;  [facility];  Service: Cardiovascular;  Laterality: N/A;   LEFT HEART CATH AND CORONARY ANGIOGRAPHY N/A 01/31/2023   Procedure: LEFT HEART CATH AND CORONARY ANGIOGRAPHY;  Surgeon: Elder Negus, MD;  [facility]: [facility];  Service: Cardiovascular;  Laterality: N/A;   MASTECTOMY PARTIAL / LUMPECTOMY W/ AXILLARY LYMPHADENECTOMY Right    lumpectomy and lymph nodes removed   PATCH ANGIOPLASTY Right 06/09/2019   Procedure: Patch Angioplasty of right carotid artery using hemashield paltinum finesse patch;  Surgeon: Sherren Kerns, MD;  Location: [facility] OR;  Service: Vascular;  Laterality: Right;   RIGHT AND LEFT HEART CATH N/A 02/01/2023  Procedure: RIGHT AND LEFT HEART CATH;  Surgeon: Elder Negus, MD;  [facility];  Service: Cardiovascular;  Laterality: N/A;   RIGHT HEART CATH N/A 02/02/2023   Procedure: RIGHT HEART CATH;  Surgeon: Dolores Patty, MD;  [facility];  Service: Cardiovascular;  Laterality: N/A;   RIGHT/LEFT HEART CATH AND CORONARY ANGIOGRAPHY N/A 01/27/2023   Procedure: RIGHT/LEFT HEART CATH AND CORONARY  ANGIOGRAPHY;  Surgeon: Elder Negus, MD;  [facility];  Service: Cardiovascular;  Laterality: N/A;   TEE WITHOUT CARDIOVERSION N/A 02/04/2023   Procedure: TRANSESOPHAGEAL ECHOCARDIOGRAM;  Surgeon: Orbie Pyo, MD;  Location: [facility] INVASIVE CV LAB;  Service: Open Heart Surgery;  Laterality: N/A;   TEMPORARY PACEMAKER N/A 02/02/2023   Procedure: TEMPORARY PACEMAKER;  Surgeon: Dolores Patty, MD;  [facility];  Service: Cardiovascular;  Laterality: N/A;   TRANSCATHETER AORTIC VALVE REPLACEMENT, TRANSFEMORAL N/A 02/04/2023   Procedure: Transcatheter Aortic Valve Replacement, Transfemoral;  Surgeon: Orbie Pyo, MD;  [facility];  Service: Open Heart Surgery;  Laterality: N/A;   TUBAL LIGATION     VULVA SURGERY     Multiple times for dysplasia   HPI: Madison West is a 67 yo female presenting to ED 8/4 c/o of shortness of breath, weight gain, and generalized weakness, admitted with acute CHF. S/p LHC 8/11 with mid LAD 95% lesion, stent placed. VTACH arrest x2 8/13 requiring multiple shocks for ROSC; found to have NSTEMI. S/p IABP placement 8/13-8/17. S/p TAVR 8/15. Intubated 8/13-8/18. CXR 8/15 with moderate L pleural effusion increased in volume and resolution of previous R base atelectasis. PMH inlcudes PSVT, anxiety/depression, breast cancer s/p lumpectomy, chemo and radiation 2009, HTN, HLD, R caroitd artery stenosis s/p carotid endarterectomy (2020), CAD s/p stenting, mild to moderate aortic stenosis, GERD   Subjective: pt nervous    Recommendations for follow up therapy are one component of a multi-disciplinary discharge planning process, led by the attending physician.  Recommendations may be updated based on patient status, additional functional criteria and insurance authorization.  Assessment / Plan / Recommendation     02/09/2023   11:00 AM  Clinical Impressions  Clinical Impression Pt demonstrates mild oropharyngeal  dysphagia with slight delay in full airway protection with large sips of thin liquids, but no airway invasion throughout test. Additionally pt had mild vallecular residue with thins that cleared well with sips or swallows. Does not appear weak, perhaps a bit dry and tissues mildly edematous. No SLP f/u needed for swallowing, pt may resume thin liquids and regular solids.  SLP Visit Diagnosis Dysphagia, unspecified (R13.10)  Impact on safety and function Mild aspiration risk         02/09/2023   11:00 AM  Treatment Recommendations  Treatment Recommendations No treatment recommended at this time        02/08/2023    9:16 AM  Prognosis  Prognosis for improved oropharyngeal function Good  Barriers to Reach Goals Cognitive deficits;Time post onset       02/09/2023   11:00 AM  Diet Recommendations  SLP Diet Recommendations Regular solids;Thin liquid  Liquid Administration via Cup;Straw  Medication Administration Whole meds with liquid  Compensations Follow solids with liquid  Postural Changes Remain semi-upright after after feeds/meals (Comment)         02/09/2023   11:00 AM  Other Recommendations  Follow Up Recommendations No SLP follow up       02/08/2023  9:16 AM  Frequency and Duration   Speech Therapy Frequency (ACUTE ONLY) min 2x/week  Treatment Duration 2 weeks         02/09/2023   11:00 AM  Oral Phase  Oral Phase [facility]       02/09/2023   11:00 AM  Pharyngeal Phase  Pharyngeal Phase Impaired  Pharyngeal- Thin Straw Delayed swallow initiation-vallecula;Delayed swallow initiation-pyriform sinuses;Pharyngeal residue - valleculae  Pharyngeal- Puree Pharyngeal residue - valleculae  Pharyngeal- Regular Pharyngeal residue - valleculae         No data to display           Allanah Mcfarland, Riley Nearing 02/09/2023, 11:35 AM

## 2023-02-09 NOTE — Progress Notes (Signed)
Inpatient Rehabilitation Admissions Coordinator   I will place rehab consult to begin rehab venue discussions.Ottie Glazier, RN, MSN Rehab Admissions Coordinator [phone] 02/09/2023 10:31 AM

## 2023-02-10 ENCOUNTER — Inpatient Hospital Stay (HOSPITAL_COMMUNITY): Payer: Medicare Other

## 2023-02-10 DIAGNOSIS — R1310 Dysphagia, unspecified: Secondary | ICD-10-CM

## 2023-02-10 DIAGNOSIS — R5381 Other malaise: Secondary | ICD-10-CM

## 2023-02-10 DIAGNOSIS — J9601 Acute respiratory failure with hypoxia: Secondary | ICD-10-CM | POA: Insufficient documentation

## 2023-02-10 DIAGNOSIS — R570 Cardiogenic shock: Secondary | ICD-10-CM | POA: Diagnosis not present

## 2023-02-10 DIAGNOSIS — R609 Edema, unspecified: Secondary | ICD-10-CM

## 2023-02-10 LAB — PHOSPHORUS: Phosphorus: 4.6 mg/dL (ref 2.5–4.6)

## 2023-02-10 LAB — CBC
HCT: 26.5 % — ABNORMAL LOW (ref 36.0–46.0)
Hemoglobin: 8.6 g/dL — ABNORMAL LOW (ref 12.0–15.0)
MCH: 31.3 pg (ref 26.0–34.0)
MCHC: 32.5 g/dL (ref 30.0–36.0)
MCV: 96.4 fL (ref 80.0–100.0)
Platelets: 325 10*3/uL (ref 150–400)
RBC: 2.75 MIL/uL — ABNORMAL LOW (ref 3.87–5.11)
RDW: 14.2 % (ref 11.5–15.5)
WBC: 9.2 10*3/uL (ref 4.0–10.5)
nRBC: 0 % (ref 0.0–0.2)

## 2023-02-10 LAB — GLUCOSE, CAPILLARY
Glucose-Capillary: 100 mg/dL — ABNORMAL HIGH (ref 70–99)
Glucose-Capillary: 110 mg/dL — ABNORMAL HIGH (ref 70–99)
Glucose-Capillary: 115 mg/dL — ABNORMAL HIGH (ref 70–99)
Glucose-Capillary: 115 mg/dL — ABNORMAL HIGH (ref 70–99)
Glucose-Capillary: 144 mg/dL — ABNORMAL HIGH (ref 70–99)
Glucose-Capillary: 151 mg/dL — ABNORMAL HIGH (ref 70–99)
Glucose-Capillary: 165 mg/dL — ABNORMAL HIGH (ref 70–99)

## 2023-02-10 LAB — BASIC METABOLIC PANEL
Anion gap: 8 (ref 5–15)
BUN: 22 mg/dL (ref 8–23)
CO2: 30 mmol/L (ref 22–32)
Calcium: 9 mg/dL (ref 8.9–10.3)
Chloride: 96 mmol/L — ABNORMAL LOW (ref 98–111)
Creatinine, Ser: 1.02 mg/dL — ABNORMAL HIGH (ref 0.44–1.00)
GFR, Estimated: 60 mL/min — ABNORMAL LOW (ref >60)
Glucose, Bld: 152 mg/dL — ABNORMAL HIGH (ref 70–99)
Potassium: 3.9 mmol/L (ref 3.5–5.1)
Sodium: 134 mmol/L — ABNORMAL LOW (ref 135–145)

## 2023-02-10 LAB — COOXEMETRY PANEL
Carboxyhemoglobin: 3 % — ABNORMAL HIGH (ref 0.5–1.5)
Methemoglobin: <0.7 % (ref 0.0–1.5)
O2 Saturation: 85.9 %
Total hemoglobin: 8.2 g/dL — ABNORMAL LOW (ref 12.0–16.0)

## 2023-02-10 LAB — MAGNESIUM: Magnesium: 2.2 mg/dL (ref 1.7–2.4)

## 2023-02-10 MED ORDER — POTASSIUM CHLORIDE CRYS ER 20 MEQ PO TBCR
40.0000 meq | EXTENDED_RELEASE_TABLET | Freq: Once | ORAL | Status: AC
  Administered 2023-02-10: 40 meq via ORAL
  Filled 2023-02-10: qty 2

## 2023-02-10 MED ORDER — VANCOMYCIN HCL IN DEXTROSE 1-5 GM/200ML-% IV SOLN
1000.0000 mg | Freq: Two times a day (BID) | INTRAVENOUS | Status: DC
  Administered 2023-02-10: 1000 mg via INTRAVENOUS
  Filled 2023-02-10 (×2): qty 200

## 2023-02-10 MED ORDER — METHOCARBAMOL 500 MG PO TABS
500.0000 mg | ORAL_TABLET | Freq: Two times a day (BID) | ORAL | Status: DC
  Administered 2023-02-10 – 2023-02-18 (×16): 500 mg via ORAL
  Filled 2023-02-10 (×16): qty 1

## 2023-02-10 MED ORDER — FUROSEMIDE 10 MG/ML IJ SOLN
60.0000 mg | Freq: Once | INTRAMUSCULAR | Status: AC
  Administered 2023-02-10: 60 mg via INTRAVENOUS
  Filled 2023-02-10: qty 6

## 2023-02-10 MED ORDER — SPIRONOLACTONE 25 MG PO TABS
25.0000 mg | ORAL_TABLET | Freq: Every day | ORAL | Status: DC
  Administered 2023-02-11 – 2023-02-18 (×8): 25 mg via ORAL
  Filled 2023-02-10 (×8): qty 1

## 2023-02-10 MED ORDER — APIXABAN 5 MG PO TABS
10.0000 mg | ORAL_TABLET | Freq: Two times a day (BID) | ORAL | Status: AC
  Administered 2023-02-10 – 2023-02-16 (×14): 10 mg via ORAL
  Filled 2023-02-10 (×14): qty 2

## 2023-02-10 MED ORDER — VANCOMYCIN HCL 2000 MG/400ML IV SOLN
2000.0000 mg | Freq: Once | INTRAVENOUS | Status: AC
  Administered 2023-02-10: 2000 mg via INTRAVENOUS
  Filled 2023-02-10: qty 400

## 2023-02-10 MED ORDER — SODIUM CHLORIDE 0.9 % IV SOLN
2.0000 g | INTRAVENOUS | Status: DC
  Administered 2023-02-10: 2 g via INTRAVENOUS
  Filled 2023-02-10 (×2): qty 20

## 2023-02-10 MED ORDER — CLOPIDOGREL BISULFATE 300 MG PO TABS
300.0000 mg | ORAL_TABLET | Freq: Once | ORAL | Status: AC
  Administered 2023-02-10: 300 mg via ORAL
  Filled 2023-02-10: qty 1

## 2023-02-10 MED ORDER — MELATONIN 3 MG PO TABS
3.0000 mg | ORAL_TABLET | Freq: Every day | ORAL | Status: DC
  Administered 2023-02-10 – 2023-02-17 (×8): 3 mg via ORAL
  Filled 2023-02-10 (×8): qty 1

## 2023-02-10 MED ORDER — APIXABAN 5 MG PO TABS
5.0000 mg | ORAL_TABLET | Freq: Two times a day (BID) | ORAL | Status: DC
  Administered 2023-02-17 – 2023-02-18 (×3): 5 mg via ORAL
  Filled 2023-02-10 (×3): qty 1

## 2023-02-10 MED ORDER — SACUBITRIL-VALSARTAN 24-26 MG PO TABS
1.0000 | ORAL_TABLET | Freq: Two times a day (BID) | ORAL | Status: DC
  Administered 2023-02-10 – 2023-02-11 (×4): 1 via ORAL
  Filled 2023-02-10 (×5): qty 1

## 2023-02-10 MED ORDER — CLOPIDOGREL BISULFATE 75 MG PO TABS
75.0000 mg | ORAL_TABLET | Freq: Every day | ORAL | Status: DC
  Administered 2023-02-11 – 2023-02-18 (×8): 75 mg via ORAL
  Filled 2023-02-10 (×8): qty 1

## 2023-02-10 NOTE — Progress Notes (Signed)
Inpatient Rehab Admissions Coordinator:   I met with Pt. And friend to discuss potential CIR admit. Pt. Is interested but unsure if daughters can provide 24/7 support at d/c. I will reach out to them to discuss.  Pt. Also is not medically ready for CIR at this time, but I will follow for potential admit once medically ready if she has adequate support at home.   Megan Salon, MS, CCC-SLP Rehab Admissions Coordinator  [phone] (celll) [phone] (office)

## 2023-02-10 NOTE — Progress Notes (Signed)
Patient ID: Madison West, female   DOB: [birth year], 67 y.o.   MRN: [id number]     Advanced Heart Failure Rounding Note  PCP-Cardiologist: None   Subjective:    - 8/15 s/p TAVR. Coronary angiogram w/ widely patent stent. Post op echo w/ no paravalvular leak. - Echo (8/16): EF 35-40%, mild LVH, WMAs, RV normal, bioprosthetic aortic valve s/p TAVR with mean gradient 13 mmH, trivial PVL, IVC mildly dilated.  - IABP removed 8/17 - extubated 8/18  On milrinone 0.25. Co-ox pending.   SCr/K stable w/ GDMT additions. BP remains elevated.   Completed abx on 8/20 for PNA, Enterobacter cloacae on trach aspirate culture.   Sitting up in chair. Feels tried and fatigued. Complaining of LUE pain and swelling. On higher O2 requirements today, 15L [place]. O2 sats 91%. + productive cough. CXR completed today, interpretation pending. Very anxious today.   Objective:   Weight Range: 101.2 kg Body mass index is 33.92 kg/m.   Vital Signs:   Temp:  [98.1 F (36.7 C)-98.7 F (37.1 C)] 98.6 F (37 C) (08/21 0400) Pulse Rate:  [74-93] 89 (08/21 0700) Resp:  [16-28] 22 (08/21 0700) BP: (111-163)/(60-99) 154/80 (08/21 0700) SpO2:  [90 %-97 %] 92 % (08/21 0700) Arterial Line BP: (69-204)/(54-79) 69/54 (08/20 1000) Weight:  [101.2 kg] 101.2 kg (08/21 0500) Last BM Date : 02/01/23  Weight change: Filed Weights   02/08/23 0645 02/09/23 0630 02/10/23 0500  Weight: 101.2 kg 99.1 kg 101.2 kg    Intake/Output:   Intake/Output Summary (Last 24 hours) at 02/10/2023 0841 Last data filed at 02/10/2023 0400 Gross per 24 hour  Intake 472.84 ml  Output 780 ml  Net -307.16 ml    Physical Exam   General:  fatigued appearing. Anxious but no respiratory difficulty HEENT: normal Neck: supple. no JVD. Carotids 2+ bilat; no bruits. No lymphadenopathy or thyromegaly appreciated. Cor: PMI nondisplaced. Regular rate & rhythm. No rubs, gallops or murmurs. Lungs: decreased BS at the bases  Abdomen: obese,  soft, nontender, nondistended. No hepatosplenomegaly. No bruits or masses. Good bowel sounds. Extremities: no cyanosis, clubbing, rash, no LE edema + LUE swelling and erythema w/ increased warmth compared to contralateral side + LUE PICC  Neuro: alert & oriented x 3, cranial nerves grossly intact. moves all 4 extremities w/o difficulty. Affect pleasant.  Telemetry   NSR  (Personally reviewed)    Labs    CBC Recent Labs    02/09/23 0404 02/10/23 0449  WBC 9.3 9.2  HGB 9.3* 8.6*  HCT 27.7* 26.5*  MCV 96.9 96.4  PLT 309 325   Basic Metabolic Panel Recent Labs    21/30/86 0404 02/10/23 0449  NA 136 134*  K 3.8 3.9  CL 99 96*  CO2 28 30  GLUCOSE 114* 152*  BUN 19 22  CREATININE 0.83 1.02*  CALCIUM 8.7* 9.0  MG 2.5* 2.2  PHOS  --  4.6   Liver Function Tests No results for input(s): "AST", "ALT", "ALKPHOS", "BILITOT", "PROT", "ALBUMIN" in the last 72 hours.  No results for input(s): "LIPASE", "AMYLASE" in the last 72 hours. Cardiac Enzymes No results for input(s): "CKTOTAL", "CKMB", "CKMBINDEX", "TROPONINI" in the last 72 hours.  BNP: BNP (last 3 results) Recent Labs    01/23/23 2208  BNP 462.3*   ProBNP (last 3 results) No results for input(s): "PROBNP" in the last 8760 hours.  D-Dimer No results for input(s): "DDIMER" in the last 72 hours. Hemoglobin A1C No results for input(s): "HGBA1C" in the  last 72 hours.  Fasting Lipid Panel No results for input(s): "CHOL", "HDL", "LDLCALC", "TRIG", "CHOLHDL", "LDLDIRECT" in the last 72 hours.  Thyroid Function Tests No results for input(s): "TSH", "T4TOTAL", "T3FREE", "THYROIDAB" in the last 72 hours.  Invalid input(s): "FREET3"  Other results:  Imaging    No results found.   Medications:   Scheduled Medications:  amiodarone  400 mg Oral BID   aspirin  81 mg Oral Daily   busPIRone  10 mg Oral BID   Chlorhexidine Gluconate Cloth  6 each Topical Daily   docusate  100 mg Oral BID   famotidine  20 mg Oral  BID   feeding supplement  237 mL Oral BID BM   FLUoxetine  20 mg Oral QHS   furosemide  40 mg Oral Daily   gabapentin  300 mg Oral TID   heparin injection (subcutaneous)  5,000 Units Subcutaneous Q8H   insulin aspart  2-6 Units Subcutaneous Q4H   losartan  12.5 mg Oral Daily   mouth rinse  15 mL Mouth Rinse 4 times per day   oxymetazoline  1 spray Each Nare BID   polyethylene glycol  17 g Oral BID   rosuvastatin  20 mg Oral Daily   sodium chloride flush  10-40 mL Intracatheter Q12H   sodium chloride flush  3 mL Intravenous Q12H   sodium chloride flush  3 mL Intravenous Q12H   spironolactone  12.5 mg Oral Daily   ticagrelor  90 mg Oral BID    Infusions:  sodium chloride     sodium chloride Stopped (02/08/23 0416)   sodium chloride     sodium chloride     sodium chloride     albumin human Stopped (02/03/23 1032)   methocarbamol (ROBAXIN) IV Stopped (02/09/23 2222)   milrinone 0.25 mcg/kg/min (02/10/23 0452)   norepinephrine (LEVOPHED) Adult infusion Stopped (02/08/23 0503)   sodium chloride      PRN Medications: sodium chloride, sodium chloride, Place/Maintain arterial line **AND** sodium chloride, sodium chloride, acetaminophen, albumin human, ALPRAZolam, alum & mag hydroxide-simeth, bisacodyl, calcium carbonate, hydrALAZINE, HYDROmorphone (DILAUDID) injection, lidocaine, lidocaine, LORazepam, nitroGLYCERIN, ondansetron (ZOFRAN) IV, mouth rinse, oxyCODONE, sodium chloride flush, sodium chloride flush, sodium chloride flush  Patient Profile  67 y.o. female with history of CAD s/p PCI to RCA in 2021, carotid artery stenosis s/p R CEA, aortic valve stenosis. Admitted with unstable angina. Found to have severe LAD stenosis s/p PCI and severe low flow low gradient AS. Coarse c/b refractory VT and hemodynamic instability >> cardiogenic shock. Assessment/Plan  Acute systolic CHF >> cardiogenic shock - In setting of severe ischemic heart disease, reduced EF, severe AS and refractory  VT/VF - Echo 01/25/23: EF 30-35%, septum and apex HK, RV low normal, low flow low graident severe AS with mean gradient 20 mmHg, AVA 0.93 cm2, DI 0.33 - echo 02/01/23: EF 20-25%, RV okay, severe low flow low gradient severe AS with mean gradient 25 mmHg, AVA 1.1 cm2 - s/p PCI/stent to LAD. Stent thrombosis on 08/09 treated with thrombectomy.  - IABP placed 08/13. Had considered VA ECMO but hemodynamics improved with inotrope support - S/p TAVR 8/15 - Echo 8/16 with EF 35-40%, mild LVH, WMAs, RV normal, bioprosthetic aortic valve s/p TAVR with mean gradient 13 mmH, trivial PVL, IVC mildly dilated.  - IABP removed 8/17 - Continues on 0.25 milrinone. Co-ox pending. Continue for afterload reduction, will continue to wean as additional oral GDMT added  - Volume status stable, continue PO Lasix 40 mg  -  Plan to SGLT2i once foley removed  - Continue spiro 12.5 mg daily  - Stop Losartan, Start Entresto 24-26 bid  - No BB for now with low output   2. Refractory VT - S/p multiple shocks.  - No further VT over the last day  - Continue po amio 400 mg BID  - Replace K/Mg as needed    3. CAD NSTEMI -HX PCI to RCA in 2021 -Presented with unstable angina. S/p PCI/stent to LAD on 08/07. Stent thrombosis 08/09 treated with thrombectomy -HS troponin > 24K on 08/12 -LHC 08/12 with patent LAD stent -LAD appeared widely patent on TAVR angiogram -Continue DAPT and statin.    4. LFLG Severe AS -Noted on echo this admit -Not candidate for SAVR -s/p TAVR 8/15, valve stable on 8/15 echo with trivial PVL and low gradient.   5. Carotid artery stenosis -S/p R CEA in 2020  6. ID -Developed sepsis 08/13. Completed cefepime + vancomycin -Fever resolved.  -Enterobacter cloacae in trach aspirate.  -Now w/ suspected LUE cellulitis, restart abx   7. Respiratory failure -Extubated 8/18, stable on HFNC  8. Hyponatremia -suspected hypervolemic hyponatremia -Na improved with diuresis, 134 today  9. LUE  Swelling  - painful, + erythema and increased warmth  - suspect cellulitis, will initiate abx - check venous doppler for DVT r/o    Robbie Lis, PA-C  8:41 AM

## 2023-02-10 NOTE — Progress Notes (Signed)
NAME:  Madison West, MRN:  [id number], DOB:  04/02/1956, LOS: 17 ADMISSION DATE:  01/23/2023, CONSULTATION DATE:  02/10/2023 REFERRING MD:  Pokhrel - [facility] CHIEF COMPLAINT:  VT arrest   History of Present Illness:  67 year old woman who presented to [facility] ED 8/9 with SOB. PMHx significant for PSVT, HTN, HLD, R carotid artery stenosis s/p endarterectomy 2020, CAD (s/p stenting 2021), moderate aortic stenosis.  Patient presented with progressively worsening SOB, weight gain and fatigue. Also complaining of anginal chest pain similar to when she needed coronary intervention back in 2021. Admitted to [facility] 8/9 and treated for new onset CHF. Echo showing LVEF 30-35%; +RWMAs, moderate aortic stenosis. BNP 462. EKG showing PVCs, LAE, LAFB w/ minimal ST elevation. Patient underwent cardiac cath 8/7 showing mid LAD 95% lesion with LAD stent placement. CT surgery consulted for severe aortic stenosis. Patient has been having episodes of non sustained VT, started on metoprolol.    On 8/11, patient on floor and had pulseless VT. CPR initiated, given Epi, and shocked x 2 with ROSC. Patient alert after CPR event but slightly confused. Patient again went into VT and was shocked. Given amio, mag, calcium. Patient started on amio drip. Patient transferred to [facility] ICU and cardiology consulted. EKG showing ST elevation in anterior leads. Code STEMI. PCCM consulted.  Required IABP placement 8/13. Ultimately underwent TAVR 8/15.  Pertinent Medical History:  PSVT, anxiety/depression, breast cancer (s/p lumpectomy, chemo and radiation in 2009), HTN, HLD, right carotid artery stenosis status post carotid endarterectomy in 2020, CAD status post stent to LAD 8/7  Significant Hospital Events: Including procedures, antibiotic start and stop dates in addition to other pertinent events   01/31/2023 LHC intervention on the mid LAD. 8/13 Overnight patient developed refractory VT shocked multiple times taken back to Cath Lab,  intra-aortic balloon pump placed.  Stabilized intolerant of inotropes due to recurrent VT on amiodarone lidocaine and Neo-Synephrine. Was taken for consideration of VAECMO and then the decision was to not cannulate 8/15 Plans for TAVR today  8/16 Doing better, weaning pressors, remains intubated, IABP in place 8/17 IABP removed 8/18 Extubated 8/19 Swan removed, Lasix gtt stopped. PICC placed. 8/20 MBS. Weaning milrinone. Started spiro and losartan   Interim History / Subjective:  NAEO  OOB this morning to commode   Coox has not resulted.   Objective:  Blood pressure (!) 172/95, pulse 90, temperature 98.9 F (37.2 C), temperature source Oral, resp. rate 18, height 5\' 8"  (1.727 m), weight 101.2 kg, SpO2 94%.        Intake/Output Summary (Last 24 hours) at 02/10/2023 1027 Last data filed at 02/10/2023 0400 Gross per 24 hour  Intake 319.97 ml  Output 605 ml  Net -285.03 ml   Filed Weights   02/08/23 0645 02/09/23 0630 02/10/23 0500  Weight: 101.2 kg 99.1 kg 101.2 kg   Physical Examination: General: acutely and chronically ill appearing older adult F NAD  HEENT: NCAT pink mm  anicteric sclera  Neuro: AAOx4, generalized weakness.  CV: rr, some PVCs  PULM: Even unlabored. + rhonchi.  GI: Soft ndnt  Extremities: no acute joint deformity, no cyanosis  Skin: c/d/w   Resolved Hospital Problem List:    Assessment & Plan:   VT VT arrest Cardiogenic shock Acute HFrEF  Severe AS ACS  HTN -s/p LAD PCI DES 8/9  -TAVR 8/15 - IABP out 8/17  P -cont PO amio -awaiting 8/21 coox  -- at present on 0.25 milrinone  -DAPT -spiro, entresto, lasix  -  crestor  Acute respiratory failure with hypoxia  Enterobacter cloacae PNA Dysphagia  - cont cefepime  -pulm hygiene, IS, flutter, mobility -Wean O2 for goal >92  -appreciate SLP recs   AKI  -trend renal indices, UOP   Anemia  -follow CBC  Anxiety  - Continue Buspar - Xanax TID PRN (must be crushed in puree), Ativan IV if  needed  Hyperglycemia  - SSI  Physical deconditioning Sleep disturbance -PT/OT, nutr status  -melatonin  -delirium precautions   LUE pain -LUE PICC, some swelling and localized pain -vanc/rocephin started by Adv HF for possible cellulitis. Check MRSA again, has been neg previously and might be able to de-escalate  -[place] ordered   Best Practice (right click and "Reselect all SmartList Selections" daily)   Diet/type: reg  DVT prophylaxis: heparin ppx GI prophylaxis: N/A Lines: N/A Foley:  N/A Code Status:  full code Last date of multidisciplinary goals of care discussion: continue to update daughters. Pt updated 8/21  Critical care time:  CRITICAL CARE Performed by: Lanier Clam   Total critical care time: 42 minutes  Critical care time was exclusive of separately billable procedures and treating other patients. Critical care was necessary to treat or prevent imminent or life-threatening deterioration.  Critical care was time spent personally by me on the following activities: development of treatment plan with patient and/or surrogate as well as nursing, discussions with consultants, evaluation of patient's response to treatment, examination of patient, obtaining history from patient or surrogate, ordering and performing treatments and interventions, ordering and review of laboratory studies, ordering and review of radiographic studies, pulse oximetry and re-evaluation of patient's condition.  Tessie Fass MSN, AGACNP-BC [facility] Pulmonary/Critical Care Medicine Amion for pager  02/10/2023, 10:27 AM

## 2023-02-10 NOTE — PMR Pre-admission (Signed)
PMR Admission Coordinator Pre-Admission Assessment  Patient: Madison West is an 67 y.o., female MRN: [id number] DOB: 1955/11/04 Height: 5\' 8"  (172.7 cm) Weight: 102.4 kg  Insurance Information HMO:     PPO:      PCP:      IPA:      80/20: ye     OTHER:  PRIMARY: Medicare Parts A and B       Policy#: [id number]   Subscriber:  Phone#: Verified online    Fax#:  Pre-Cert#:       Employer:  Benefits:  Phone #:      Name:  Eff. Date: Parts A  and B effective 08/21/18 Deduct: $1632      Out of Pocket Max:  None      Life Max: N/A  CIR: 100%      SNF: 100 days Outpatient: 80%     Co-Pay: 20% Home Health: 100%      Co-Pay: none DME: 80%     Co-Pay: 20% Providers: patient's choice [facility]      Policy#: [id number]      Phone#:   Financial Counselor:       Phone#:   The "Data Collection Information Summary" for patients in Inpatient Rehabilitation Facilities with attached "Privacy Act Statement-Health Care Records" was provided and verbally reviewed with: Patient  Emergency Contact Information Contact Information     Name Relation Home Work Mobile   [place] Daughter [phone]  [phone]      Other Contacts     Name Relation Home Work Mobile   [place] Daughter [phone]  [phone]       Current Medical History  Patient Admitting Diagnosis: [facility] arrest Cardiac Debility s/p TAVR History of Present Illness: Madison West, a 67 y/o with a medical  history of PSVT, anxiety/depression, breast cancer status post lumpectomy, chemo and radiation in 2009, hypertension, hyperlipidemia, right carotid artery stenosis status post carotid endarterectomy in 2020, CAD status post stenting, mild to moderate aortic stenosis, GERD presented to the [facility]  ED 02/03/23 with complaints of shortness of breath, weight gain, and generalized weakness.  Slightly tachycardic on arrival but afebrile.  Not hypotensive.  Satting in the low 90s on room  air.  Labs significant for sodium 128, chloride 91, creatinine 0.7, BNP 462, troponin 35> 38, COVID/influenza/RSV PCR negative. In the ED, labs, vitals, and imaging as follows: T 99 132/88 HR 87 RR 16. Na 128, BNP 462, Troponin 35 to 38, CBCD nl. CTA chest - no PE, bronchial wall thickening, emphysema, CAD, no pulmonary edema. CXR - NAD, EKG sinust tack PVC, LAE, LAFB minimal ST elevation. Pt. Admitted for management of CHF. Pt. Underwent LHC 8/11 with mid LAD 95% lesion, stent placed. VTACH arrest x2 on 8/13 requiring CPR and multiple shocks for ROSC; found to have NSTEMI. S/p IABP placement 8/13-8/17. S/p TAVR 8/15. ETT 8/13-8/18. Pt. Seen by PT/OT and they recommend CIR to assist return to PLOF.     Patient's medical record from [facility]  has been reviewed by the rehabilitation admission coordinator and physician.  Past Medical History  Past Medical History:  Diagnosis Date   Anxiety    Arthritis    low back and hip pain intermittent   Breast cancer (HCC) 06/02/07   r breast -surgery ,radiaology. chemotherapy   Carpal tunnel syndrome    right hand   Colon polyps    Complication of anesthesia    Fentanyl, Versed-makes extra hyper, bradycardia  x 1 in PACU, [facility] (08/15/11 cardiology felt neostigmine may have resulted in AV nodal block)    Coronary artery disease    Depression    denies   Dysplasia of vulva    Hypertension    Palpitations    PSVT, s/p adenosine 08/04/16   S/P breast lumpectomy 07/04/07   R breast   S/P radiation therapy 2009    Has the patient had major surgery during 100 days prior to admission? Yes  Family History   family history includes Colon cancer (age of onset: 70) in her mother; Colon polyps in her brother; Diabetes type II in her brother; Heart disease in her father and paternal grandfather.  Current Medications  Current Facility-Administered Medications:    0.9 %  sodium chloride infusion, 250 mL, Intravenous, PRN, Janetta Hora, PA-C   0.9 %  sodium chloride infusion, , Intravenous, Continuous, Janetta Hora, PA-C, Stopped at 02/08/23 0416   0.9 %  sodium chloride infusion, 250 mL, Intravenous, PRN, Janetta Hora, PA-C   Place/Maintain arterial line, , , Until Discontinued **AND** 0.9 %  sodium chloride infusion, , Intra-arterial, PRN, Janetta Hora, PA-C   acetaminophen (TYLENOL) tablet 650 mg, 650 mg, Oral, Q6H, Agarwala, Ravi, MD, 650 mg at 02/15/23 0908   ALPRAZolam Prudy Feeler) tablet 3 mg, 3 mg, Oral, TID PRN, Lorin Glass, MD, 3 mg at 02/15/23 0956   alum & mag hydroxide-simeth (MAALOX/MYLANTA) 200-200-20 MG/5ML suspension 30 mL, 30 mL, Oral, Q6H PRN, Janetta Hora, PA-C, 30 mL at 02/01/23 8413   amiodarone (PACERONE) tablet 200 mg, 200 mg, Oral, BID, Alen Bleacher, NP   apixaban (ELIQUIS) tablet 10 mg, 10 mg, Oral, BID, 10 mg at 02/15/23 0909 **FOLLOWED BY** [START ON 02/17/2023] apixaban (ELIQUIS) tablet 5 mg, 5 mg, Oral, BID, Lorin Glass, MD   aspirin chewable tablet 81 mg, 81 mg, Oral, Daily, Icard, Bradley L, DO, 81 mg at 02/15/23 2440   bisacodyl (DULCOLAX) suppository 10 mg, 10 mg, Rectal, Daily PRN, Icard, Bradley L, DO   busPIRone (BUSPAR) tablet 10 mg, 10 mg, Oral, BID, Lorin Glass, MD, 10 mg at 02/15/23 1027   calcium carbonate (TUMS - dosed in mg elemental calcium) chewable tablet 200 mg of elemental calcium, 1 tablet, Oral, TID PRN, Janetta Hora, PA-C   Chlorhexidine Gluconate Cloth 2 % PADS 6 each, 6 each, Topical, Daily, Janetta Hora, PA-C, 6 each at 02/15/23 0913   clopidogrel (PLAVIX) tablet 75 mg, 75 mg, Oral, Daily, Lorin Glass, MD, 75 mg at 02/15/23 2536   docusate sodium (COLACE) capsule 100 mg, 100 mg, Oral, BID, Lorin Glass, MD, 100 mg at 02/15/23 0908   feeding supplement (ENSURE ENLIVE / ENSURE PLUS) liquid 237 mL, 237 mL, Oral, BID BM, Lorin Glass, MD, 237 mL at 02/14/23 0908   FLUoxetine (PROZAC) capsule 20 mg, 20 mg, Oral, QHS,  Lorin Glass, MD, 20 mg at 02/13/23 2121   furosemide (LASIX) tablet 40 mg, 40 mg, Oral, Daily, Brynda Peon L, NP, 40 mg at 02/15/23 1033   gabapentin (NEURONTIN) capsule 300 mg, 300 mg, Oral, TID, Lorin Glass, MD, 300 mg at 02/15/23 6440   hydrALAZINE (APRESOLINE) injection 10 mg, 10 mg, Intravenous, Q2H PRN, Laurey Morale, MD, 10 mg at 02/11/23 0840   HYDROmorphone (DILAUDID) injection 1 mg, 1 mg, Intravenous, Q3H PRN, Lorin Glass, MD, 1 mg at 02/14/23 2226   hydrOXYzine (ATARAX) tablet 10 mg, 10  mg, Oral, TID PRN, Lanier Clam, NP, 10 mg at 02/13/23 2123   insulin aspart (novoLOG) injection 2-6 Units, 2-6 Units, Subcutaneous, TID WC, Agarwala, Ravi, MD   ipratropium-albuterol (DUONEB) 0.5-2.5 (3) MG/3ML nebulizer solution 3 mL, 3 mL, Nebulization, Q6H PRN, Bowser, Kaylyn Layer, NP   lidocaine (LIDODERM) 5 % 2 patch, 2 patch, Transdermal, Q12H PRN, Lynnell Catalan, MD, 2 patch at 02/14/23 0907   lidocaine (XYLOCAINE) 2 % viscous mouth solution 15 mL, 15 mL, Mouth/Throat, Q3H PRN, Janetta Hora, PA-C   melatonin tablet 3 mg, 3 mg, Oral, Daily, Bowser, Grace E, NP, 3 mg at 02/14/23 2234   methocarbamol (ROBAXIN) tablet 500 mg, 500 mg, Oral, BID, Lorin Glass, MD, 500 mg at 02/15/23 8119   nitroGLYCERIN (NITROSTAT) SL tablet 0.4 mg, 0.4 mg, Sublingual, Q5 min PRN, Cline Crock R, PA-C   ondansetron [facility]) injection 4 mg, 4 mg, Intravenous, Q6H PRN, Janetta Hora, PA-C   Oral care mouth rinse, 15 mL, Mouth Rinse, PRN, Icard, Rachel Bo, DO   Oral care mouth rinse, 15 mL, Mouth Rinse, PRN, Lorin Glass, MD   Oral care mouth rinse, 15 mL, Mouth Rinse, PRN, Lynnell Catalan, MD   oxyCODONE (Oxy IR/ROXICODONE) immediate release tablet 5 mg, 5 mg, Oral, Q4H PRN, Janetta Hora, PA-C, 5 mg at 02/15/23 0754   polyethylene glycol (MIRALAX / GLYCOLAX) packet 17 g, 17 g, Oral, BID, Icard, Bradley L, DO, 17 g at 02/15/23 0907   rosuvastatin (CRESTOR) tablet 20 mg, 20 mg,  Oral, Daily, Icard, Bradley L, DO, 20 mg at 02/15/23 0908   sacubitril-valsartan (ENTRESTO) 97-103 mg per tablet, 1 tablet, Oral, BID, Alen Bleacher, NP   senna-docusate (Senokot-S) tablet 1 tablet, 1 tablet, Oral, BID, Lorin Glass, MD, 1 tablet at 02/15/23 1478   sodium chloride 0.9 % bolus 250 mL, 250 mL, Intravenous, Once, Cline Crock R, PA-C   sodium chloride flush (NS) 0.9 % injection 10-40 mL, 10-40 mL, Intracatheter, Q12H, Lorin Glass, MD, 10 mL at 02/15/23 0909   sodium chloride flush (NS) 0.9 % injection 10-40 mL, 10-40 mL, Intracatheter, PRN, Lorin Glass, MD   sodium chloride flush (NS) 0.9 % injection 3 mL, 3 mL, Intravenous, Q12H, Janetta Hora, PA-C, 3 mL at 02/15/23 0913   sodium chloride flush (NS) 0.9 % injection 3 mL, 3 mL, Intravenous, PRN, Janetta Hora, PA-C   sorbitol 70 % solution 30 mL, 30 mL, Oral, Once, Lorin Glass, MD   spironolactone (ALDACTONE) tablet 25 mg, 25 mg, Oral, Daily, Bensimhon, Bevelyn Buckles, MD, 25 mg at 02/15/23 0909  Patients Current Diet:  Diet Order             Diet Heart Room service appropriate? Yes with Assist; Fluid consistency: Thin; Fluid restriction: 1200 mL Fluid  Diet effective now                   Precautions / Restrictions Precautions Precautions: Fall Precaution Comments: multiple lines/leads, high anxiety Restrictions Weight Bearing Restrictions: No RLE Weight Bearing: Weight bearing as tolerated   Has the patient had 2 or more falls or a fall with injury in the past year? No  Prior Activity Level Community (5-7x/wk): Pt. active in the community PTA  Prior Functional Level Self Care: Did the patient need help bathing, dressing, using the toilet or eating? Independent  Indoor Mobility: Did the patient need assistance with walking from room to room (with or without device)?  Independent  Stairs: Did the patient need assistance with internal or external stairs (with or without device)?  Independent  Functional Cognition: Did the patient need help planning regular tasks such as shopping or remembering to take medications? Independent  Patient Information Are you of Hispanic, Latino/a,or Spanish origin?: A. No, not of Hispanic, Latino/a, or Spanish origin What is your race?: A. White Do you need or want an interpreter to communicate with a doctor or health care staff?: 0. No  Patient's Response To:  Health Literacy and Transportation Is the patient able to respond to health literacy and transportation needs?: Yes Health Literacy - How often do you need to have someone help you when you read instructions, pamphlets, or other written material from your doctor or pharmacy?: Never In the past 12 months, has lack of transportation kept you from medical appointments or from getting medications?: No In the past 12 months, has lack of transportation kept you from meetings, work, or from getting things needed for daily living?: No  Journalist, newspaper / Equipment Home Assistive Devices/Equipment: None Home Equipment: None  Prior Device Use: Indicate devices/aids used by the patient prior to current illness, exacerbation or injury? None of the above  Current Functional Level Cognition  Overall Cognitive Status: Within Functional Limits for tasks assessed Orientation Level: Oriented X4 General Comments: pt with known high anxiety and is on xanax which was started yesterday which inhibits the ability to progress mobility, pt was able to follow commands with increased time and answer questions appropriately    Extremity Assessment (includes Sensation/Coordination)  Upper Extremity Assessment: Generalized weakness  Lower Extremity Assessment: Defer to PT evaluation    ADLs  Overall ADL's : Needs assistance/impaired Eating/Feeding: NPO Grooming: Set up, Sitting Grooming Details (indicate cue type and reason): wiping face Upper Body Bathing: Moderate assistance, Bed  level Lower Body Bathing: Maximal assistance, Bed level Upper Body Dressing : Moderate assistance, Bed level Lower Body Dressing: Maximal assistance, Bed level Toilet Transfer: Maximal assistance Toileting- Clothing Manipulation and Hygiene: Total assistance Functional mobility during ADLs: Maximal assistance    Mobility  Overal bed mobility: Needs Assistance Bed Mobility: Supine to Sit Rolling: Max assist Supine to sit: Max assist General bed mobility comments: up in recliner    Transfers  Overall transfer level: Needs assistance Equipment used: Rolling walker (2 wheels) Transfers: Sit to/from Stand Sit to Stand: Mod assist Bed to/from chair/wheelchair/BSC transfer type:: Step pivot Step pivot transfers: Min assist, +2 physical assistance, +2 safety/equipment General transfer comment: some lifting help, pt using arms, but with pain in chest    Ambulation / Gait / Stairs / Wheelchair Mobility  Ambulation/Gait Ambulation/Gait assistance: Min assist, +2 safety/equipment Gait Distance (Feet): 45 Feet (&15') Assistive device: Rolling walker (2 wheels) Gait Pattern/deviations: Step-to pattern, Decreased stride length, Trunk flexed, Shuffle General Gait Details: very slow and difficulty picking up her legs stating she feels so heavy and like she is 67 y/o; seated rest, on 100% NRB with ambulation    Posture / Balance Dynamic Sitting Balance Sitting balance - Comments: on toilet in bathroom Balance Overall balance assessment: Needs assistance Sitting-balance support: Feet supported Sitting balance-Leahy Scale: Good Sitting balance - Comments: on toilet in bathroom Standing balance support: Bilateral upper extremity supported Standing balance-Leahy Scale: Poor Standing balance comment: UE support and assist for balance    Special needs/care consideration Skin surgical incision and Special service needs sternal precautions   Previous Home Environment (from acute therapy  documentation) Living Arrangements: Children  Lives  With: Spouse Available Help at Discharge: Family, Available PRN/intermittently Type of Home: House Home Layout: One level Home Access: Stairs to enter Entrance Stairs-Rails: Can reach both Entrance Stairs-Number of Steps: 2 Bathroom Shower/Tub: Tub/shower unit, Health visitor: Standard Bathroom Accessibility: Yes How Accessible: Accessible via walker, Accessible via wheelchair Home Care Services: No  Discharge Living Setting Plans for Discharge Living Setting: Patient's home Type of Home at Discharge: House Discharge Home Layout: One level Discharge Home Access: Stairs to enter Entrance Stairs-Rails: None Entrance Stairs-Number of Steps: 2 Discharge Bathroom Shower/Tub: Walk-in shower Discharge Bathroom Toilet: Handicapped height Discharge Bathroom Accessibility: No How Accessible: Accessible via walker, Accessible via wheelchair Does the patient have any problems obtaining your medications?: No  Social/Family/Support Systems Patient Roles: Other (Comment)  Goals Patient/Family Goal for Rehab: PT/OT Mod I Expected length of stay: 14-16 days Pt/Family Agrees to Admission and willing to participate: Yes Program Orientation Provided & Reviewed with Pt/Caregiver Including Roles  & Responsibilities: Yes  Decrease burden of Care through IP rehab admission: not anticipated   Possible need for SNF placement upon discharge: not anticipated  Patient Condition: I have reviewed medical records from [facility], spoken with CM, and patient and family member. I met with patient at the bedside for inpatient rehabilitation assessment.  Patient will benefit from ongoing PT and OT, can actively participate in 3 hours of therapy a day 5 days of the week, and can make measurable gains during the admission.  Patient will also benefit from the coordinated team approach during an Inpatient Acute Rehabilitation  admission.  The patient will receive intensive therapy as well as Rehabilitation physician, nursing, social worker, and care management interventions.  Due to safety, skin/wound care, disease management, medication administration, pain management, and patient education the patient requires 24 hour a day rehabilitation nursing.  The patient is currently min-mod A with mobility and basic ADLs.  Discharge setting and therapy post discharge at home with home health is anticipated.  Patient has agreed to participate in the Acute Inpatient Rehabilitation Program and will admit today.  Preadmission Screen Completed By:  Jeronimo Greaves, 02/15/2023 11:53 AM ______________________________________________________________________   Discussed status with Dr. Natale Lay on 02/15/23 at 1200 and received approval for admission today.  Admission Coordinator:  Jeronimo Greaves, CCC-SLP, time 1153/Date 02/15/23   Assessment/Plan: Diagnosis: Does the need for close, 24 hr/day Medical supervision in concert with the patient's rehab needs make it unreasonable for this patient to be served in a less intensive setting? {yes_no_potentially:3041433} Co-Morbidities requiring supervision/potential complications: *** Due to {due ZH:0865784}, does the patient require 24 hr/day rehab nursing? {yes_no_potentially:3041433} Does the patient require coordinated care of a physician, rehab nurse, PT, OT, and SLP to address physical and functional deficits in the context of the above medical diagnosis(es)? {yes_no_potentially:3041433} Addressing deficits in the following areas: {deficits:3041436} Can the patient actively participate in an intensive therapy program of at least 3 hrs of therapy 5 days a week? {yes_no_potentially:3041433} The potential for patient to make measurable gains while on inpatient rehab is {potential:3041437} Anticipated functional outcomes upon discharge from inpatient rehab: {functional outcomes:304600100} PT,  {functional outcomes:304600100} OT, {functional outcomes:304600100} SLP Estimated rehab length of stay to reach the above functional goals is: *** Anticipated discharge destination: {anticipated dc setting:21604} 10. Overall Rehab/Functional Prognosis: {potential:3041437}   MD Signature: ***

## 2023-02-10 NOTE — Progress Notes (Signed)
Left upper extremity venous ultrasound study completed.   Preliminary results relayed to MD and RN.  Please see CV Procedures for preliminary results.  Claudine Stallings, RVT  11:14 AM 02/10/23

## 2023-02-10 NOTE — Plan of Care (Signed)
  Problem: Clinical Measurements: Goal: Will remain free from infection Outcome: Progressing Goal: Diagnostic test results will improve Outcome: Progressing Goal: Respiratory complications will improve Outcome: Progressing Goal: Cardiovascular complication will be avoided Outcome: Progressing   

## 2023-02-10 NOTE — Progress Notes (Addendum)
OT Cancellation Note  Patient Details Name: Madison West MRN: [id number] DOB: [birth year]   Cancelled Treatment:    Reason Eval/Treat Not Completed: Patient at procedure or test/ unavailable (vascular in room upon arrival, OT to return when pt is available)  Addendum: New DVTs, eliquis started. OT to follow up tomorrow.   Donia Pounds 02/10/2023, 10:35 AM

## 2023-02-10 NOTE — Procedures (Signed)
Urology consulted for difficult Foley catheter placement.  Nursing report inability to identify urethra.  Please see separate consult note.  Will formally consult tomorrow.  Patient reports inability to urinate since Foley catheter was removed last night.  Additionally she has undergone partial vulvectomy multiple times per her report.  Patient was amenable to me attempting to place her catheter.  Patient was prepped and draped in usual sterile fashion.  Urethra was clearly identified and Foley catheter was advanced to the level of the bladder without resistance.  Balloon was inflated with 10 cc of sterile water and Foley was placed to drainage with no dependent loops.  This concluded the procedure.  Please leave in place until patient is having regular bowel movements and preferably ambulatory.  Please call with questions.

## 2023-02-11 ENCOUNTER — Ambulatory Visit: Payer: Medicare Other | Admitting: Cardiovascular Disease

## 2023-02-11 DIAGNOSIS — J9601 Acute respiratory failure with hypoxia: Secondary | ICD-10-CM

## 2023-02-11 DIAGNOSIS — R570 Cardiogenic shock: Secondary | ICD-10-CM | POA: Diagnosis not present

## 2023-02-11 LAB — CBC
HCT: 25.9 % — ABNORMAL LOW (ref 36.0–46.0)
Hemoglobin: 8.7 g/dL — ABNORMAL LOW (ref 12.0–15.0)
MCH: 32.3 pg (ref 26.0–34.0)
MCHC: 33.6 g/dL (ref 30.0–36.0)
MCV: 96.3 fL (ref 80.0–100.0)
Platelets: 331 10*3/uL (ref 150–400)
RBC: 2.69 MIL/uL — ABNORMAL LOW (ref 3.87–5.11)
RDW: 14 % (ref 11.5–15.5)
WBC: 8.4 10*3/uL (ref 4.0–10.5)
nRBC: 0 % (ref 0.0–0.2)

## 2023-02-11 LAB — GLUCOSE, CAPILLARY
Glucose-Capillary: 115 mg/dL — ABNORMAL HIGH (ref 70–99)
Glucose-Capillary: 138 mg/dL — ABNORMAL HIGH (ref 70–99)
Glucose-Capillary: 142 mg/dL — ABNORMAL HIGH (ref 70–99)
Glucose-Capillary: 121 mg/dL — ABNORMAL HIGH (ref 70–99)
Glucose-Capillary: 116 mg/dL — ABNORMAL HIGH (ref 70–99)

## 2023-02-11 LAB — RENAL FUNCTION PANEL
Albumin: 2.3 g/dL — ABNORMAL LOW (ref 3.5–5.0)
Anion gap: 7 (ref 5–15)
BUN: 25 mg/dL — ABNORMAL HIGH (ref 8–23)
CO2: 29 mmol/L (ref 22–32)
Calcium: 8.8 mg/dL — ABNORMAL LOW (ref 8.9–10.3)
Chloride: 92 mmol/L — ABNORMAL LOW (ref 98–111)
Creatinine, Ser: 0.97 mg/dL (ref 0.44–1.00)
GFR, Estimated: >60 mL/min (ref >60)
Glucose, Bld: 162 mg/dL — ABNORMAL HIGH (ref 70–99)
Phosphorus: 4.8 mg/dL — ABNORMAL HIGH (ref 2.5–4.6)
Potassium: 4.1 mmol/L (ref 3.5–5.1)
Sodium: 128 mmol/L — ABNORMAL LOW (ref 135–145)

## 2023-02-11 LAB — COOXEMETRY PANEL
Carboxyhemoglobin: 2.1 % — ABNORMAL HIGH (ref 0.5–1.5)
Methemoglobin: <0.7 % (ref 0.0–1.5)
O2 Saturation: 77.2 %
Total hemoglobin: 8.9 g/dL — ABNORMAL LOW (ref 12.0–16.0)

## 2023-02-11 MED ORDER — FUROSEMIDE 10 MG/ML IJ SOLN
60.0000 mg | Freq: Once | INTRAMUSCULAR | Status: AC
  Administered 2023-02-11: 60 mg via INTRAVENOUS
  Filled 2023-02-11: qty 6

## 2023-02-11 MED ORDER — SENNOSIDES-DOCUSATE SODIUM 8.6-50 MG PO TABS
1.0000 | ORAL_TABLET | Freq: Two times a day (BID) | ORAL | Status: DC
  Administered 2023-02-11 – 2023-02-17 (×10): 1 via ORAL
  Filled 2023-02-11 (×11): qty 1

## 2023-02-11 MED ORDER — ORAL CARE MOUTH RINSE: 15.0000 mL | OROMUCOSAL | Status: DC | PRN

## 2023-02-11 MED ORDER — SORBITOL 70 % SOLN: 30.0000 mL | Freq: Once | Status: DC

## 2023-02-11 MED ORDER — HYDROXYZINE HCL 10 MG PO TABS
10.0000 mg | ORAL_TABLET | Freq: Three times a day (TID) | ORAL | Status: DC | PRN
  Administered 2023-02-12 – 2023-02-13 (×2): 10 mg via ORAL
  Filled 2023-02-11 (×3): qty 1

## 2023-02-11 MED ORDER — IPRATROPIUM-ALBUTEROL 0.5-2.5 (3) MG/3ML IN SOLN: 3.0000 mL | Freq: Four times a day (QID) | RESPIRATORY_TRACT | Status: DC | PRN

## 2023-02-11 MED ORDER — IPRATROPIUM-ALBUTEROL 0.5-2.5 (3) MG/3ML IN SOLN: 3.0000 mL | Freq: Once | RESPIRATORY_TRACT | Status: DC

## 2023-02-11 NOTE — Progress Notes (Signed)
Stopped in to see patient again.  No specific urologic complaints and her urinary retention is almost certainly attributable to constipation and immobility.  Primary team will proceed with voiding trial once these metrics have been met.  If she fails at that time catheter to be replaced with close follow-up with [facility].  Primary team does not require formal consultation or for [place] to follow at this time.  Please call with questions.

## 2023-02-11 NOTE — Progress Notes (Signed)
Physical Therapy Treatment Patient Details Name: Madison West MRN: [id number] DOB: 09-Dec-1955 Today's Date: 02/11/2023   History of Present Illness Pt is a 67 y.o. female admitted 01/24/23 with acute CHF. S/p LHC 8/11 with mid LAD 95% lesion, stent placed. VTACH arrest x2 on 8/13 requiring CPR and multiple shocks for ROSC; found to have NSTEMI. S/p IABP placement 8/13-8/17. S/p TAVR 8/15. ETT 8/13-8/18. PMH includes PSVT, CAD, HTN, HLD, breast CA s/p lumpectomy, chemo, anxiety, depression.    PT Comments  Patient progressing to in room ambulation using eva walker due to weakness and L UE pain with new DVT.  She was able to walk to bathroom and back to recliner with A of 2 for safety and lines.  She was fatigued after large BM.  Feel she will continue to benefit from skilled PT in the acute setting.  Remains appropriate for inpatient rehab prior to home.     If plan is discharge home, recommend the following: A lot of help with walking and/or transfers;A lot of help with bathing/dressing/bathroom;Assistance with cooking/housework;Assist for transportation;Help with stairs or ramp for entrance   Can travel by private vehicle        Equipment Recommendations  Other (comment) (TBA)    Recommendations for Other Services       Precautions / Restrictions Precautions Precautions: Fall Precaution Comments: multiple lines/leads, high anxiety Restrictions Weight Bearing Restrictions: No RLE Weight Bearing: Weight bearing as tolerated     Mobility  Bed Mobility Overal bed mobility: Needs Assistance             General bed mobility comments: up in chair position already so used bed egress for energy conservation.    Transfers Overall transfer level: Needs assistance   Transfers: Sit to/from Stand Sit to Stand: Mod assist, +2 safety/equipment           General transfer comment: up from EOB with Eva walker in front using UE support and some lifting help to stand     Ambulation/Gait Ambulation/Gait assistance: Min assist, +2 safety/equipment Gait Distance (Feet): 15 Feet (x 2) Assistive device: Fara Boros Gait Pattern/deviations: Step-through pattern, Step-to pattern, Wide base of support       General Gait Details: slow pace, assist to manage eva walker and multiple lines, to bathroom then back to recliner   Stairs             Wheelchair Mobility     Tilt Bed    Modified Rankin (Stroke Patients Only)       Balance Overall balance assessment: Needs assistance Sitting-balance support: Feet supported Sitting balance-Leahy Scale: Good Sitting balance - Comments: on toilet in bathroom   Standing balance support: Reliant on assistive device for balance Standing balance-Leahy Scale: Poor                              Cognition Arousal: Alert Behavior During Therapy: Anxious Overall Cognitive Status: Within Functional Limits for tasks assessed                                          Exercises      General Comments General comments (skin integrity, edema, etc.): on 10-15L HFNC O2 with mobility; SpO2 91% or greater, HR in 90's-100's with mobility; large BM in bathroom assisted for hygiene in standing  Pertinent Vitals/Pain Pain Assessment Faces Pain Scale: Hurts little more Pain Location: L arm Pain Descriptors / Indicators: Discomfort, Sore Pain Intervention(s): Monitored during session, Repositioned    Home Living                          Prior Function            PT Goals (current goals can now be found in the care plan section) Progress towards PT goals: Progressing toward goals    Frequency    Min 1X/week      PT Plan      Co-evaluation PT/OT/SLP Co-Evaluation/Treatment: Yes Reason for Co-Treatment: Complexity of the patient's impairments (multi-system involvement);For patient/therapist safety;To address functional/ADL transfers PT goals addressed during  session: Mobility/safety with mobility;Balance;Proper use of DME        AM-PAC PT "6 Clicks" Mobility   Outcome Measure  Help needed turning from your back to your side while in a flat bed without using bedrails?: A Lot Help needed moving from lying on your back to sitting on the side of a flat bed without using bedrails?: A Lot Help needed moving to and from a bed to a chair (including a wheelchair)?: A Lot Help needed standing up from a chair using your arms (e.g., wheelchair or bedside chair)?: A Lot Help needed to walk in hospital room?: A Lot Help needed climbing 3-5 steps with a railing? : Total 6 Click Score: 11    End of Session Equipment Utilized During Treatment: Gait belt;Oxygen Activity Tolerance: Patient limited by fatigue Patient left: in chair;with call bell/phone within reach;with chair alarm set Nurse Communication: Mobility status PT Visit Diagnosis: Other abnormalities of gait and mobility (R26.89);Muscle weakness (generalized) (M62.81)     Time: 1610-9604 PT Time Calculation (min) (ACUTE ONLY): 23 min  Charges:    $Gait Training: 8-22 mins PT General Charges $$ ACUTE PT VISIT: 1 Visit                     [name], PT Acute Rehabilitation Services Office:[phone] 02/11/2023    [name] 02/11/2023, 12:09 PM

## 2023-02-11 NOTE — TOC Initial Note (Signed)
Transition of Care [facility]) - Initial/Assessment Note    Patient Details  Name: Madison West MRN: [id number] Date of Birth: [birth year]  Transition of Care [facility]) CM/SW Contact:    Elliot Cousin, RN Phone Number:[phone] 02/11/2023, 7:44 AM  Clinical Narrative:     HF TOC CM spoke to pt at bedside. States she may need rehab prior to dc. She lives with dtrs but the work. Waiting PT/OT recommendations. SNF rehab vs IP rehab. Offered choice for [facility], pt agreeable to [facility] if she gets stronger to go home which is her goal. Will wait to see what DME is needed for home. Will continue to follow for dc needs.               Expected Discharge Plan: IP Rehab Facility Barriers to Discharge: Continued Medical Work up   Patient Goals and CMS Choice Patient states their goals for this hospitalization and ongoing recovery are:: patient wants to get back to her baseline CMS Medicare.gov Compare Post Acute Care list provided to:: Patient Choice offered to / list presented to : Patient [place] ownership interest in [facility].provided to:: Patient    Expected Discharge Plan and Services In-house Referral: NA Discharge Planning Services: CM Consult Post Acute Care Choice: IP Rehab Living arrangements for the past 2 months: Single Family Home                                      Prior Living Arrangements/Services Living arrangements for the past 2 months: Single Family Home Lives with:: Self Patient language and need for interpreter reviewed:: Yes Do you feel safe going back to the place where you live?: Yes      Need for Family Participation in Patient Care: Yes (Comment) Care giver support system in place?: Yes (comment)   Criminal Activity/Legal Involvement Pertinent to Current Situation/Hospitalization: No - Comment as needed  Activities of Daily Living Home Assistive Devices/Equipment: None ADL Screening (condition at time of  admission) Patient's cognitive ability adequate to safely complete daily activities?: Yes Is the patient deaf or have difficulty hearing?: No Does the patient have difficulty seeing, even when wearing glasses/contacts?: No Does the patient have difficulty concentrating, remembering, or making decisions?: No Patient able to express need for assistance with ADLs?: Yes Does the patient have difficulty dressing or bathing?: No Independently performs ADLs?: Yes (appropriate for developmental age) Does the patient have difficulty walking or climbing stairs?: No Weakness of Legs: None Weakness of Arms/Hands: None  Permission Sought/Granted Permission sought to share information with : Family Supports, Case Manager Permission granted to share information with : Yes, Verbal Permission Granted  Share Information with NAME: Autum Stetler     Permission granted to share info w Relationship: daughter  Permission granted to share info w Contact Information: [phone]  Emotional Assessment Appearance:: Appears stated age Attitude/Demeanor/Rapport: Intubated (Following Commands or Not Following Commands) Affect (typically observed): Appropriate Orientation: : Oriented to Self, Oriented to Place, Oriented to  Time, Oriented to Situation Alcohol / Substance Use: Not Applicable Psych Involvement: No (comment)  Admission diagnosis:  SOB (shortness of breath) [R06.02] Acute CHF (congestive heart failure) (HCC) [I50.9] CHF (congestive heart failure) (HCC) [I50.9] Patient Active Problem List   Diagnosis Date Noted   Acute hypoxic respiratory failure (HCC) 02/10/2023   Dysphagia 02/10/2023   Physical deconditioning 02/10/2023   Torsades de pointes (HCC) 02/02/2023  Ventricular fibrillation (HCC) 02/02/2023   Cardiogenic shock (HCC) 02/02/2023   ST elevation myocardial infarction involving left anterior descending (LAD) coronary artery (HCC) 01/31/2023   Coronary stent thrombosis 01/31/2023    Atherosclerotic heart disease 01/27/2023   Acute HFrEF (heart failure with reduced ejection fraction) (HCC) 01/27/2023   Nonrheumatic aortic valve stenosis 01/27/2023   Former smoker 01/27/2023   Hypertensive heart disease with acute systolic congestive heart failure (HCC) 01/27/2023   Bilateral carotid artery stenosis 01/27/2023   HFrEF (heart failure with reduced ejection fraction) (HCC) 01/27/2023   Acute CHF (congestive heart failure) (HCC) 01/24/2023   CHF (congestive heart failure) (HCC) 01/24/2023   Infected cat bite 02/14/2021   Hyponatremia 02/14/2021   Hypokalemia 02/14/2021   Cellulitis 02/14/2021   Coronary artery disease involving native coronary artery of native heart with unstable angina pectoris (HCC)    Pure hypercholesterolemia    Unstable angina (HCC) 02/26/2020   Asymptomatic stenosis of right carotid artery without infarction 06/09/2019   CRPS (complex regional pain syndrome), upper limb 10/19/2012   History of DVT (deep vein thrombosis) 10/05/2011   Hypertension 10/04/2011   Anxiety 10/04/2011   Depression 10/04/2011   Personal history of vulvar dysplasia 08/15/2011   Grade 3 vulvar intraepithelial neoplasia 06/30/2011   PCP:  [name], MD Pharmacy:   [facility] - [place], [place] - [address] [place] [address] Phone: [phone] Fax: [phone]  [id number] - [place], [place] - [address] [facility] [address] [place] [address] Phone: [phone] Fax: [phone]  [facility] [address] [place] [address] Phone: [phone] Fax: [phone]  [facility] [id number] [place], [place] - [address] [place] [address] Phone: [phone] Fax: [phone]  [id number] - [place], [place] - [address] [place]  [address] Phone: [phone] Fax: [phone]     Social Determinants of Health (SDOH) Social History: SDOH Screenings   Food Insecurity: No Food Insecurity (01/24/2023)  Housing: Low Risk  (01/24/2023)  Transportation Needs: No Transportation Needs (01/24/2023)  Utilities: Not At Risk (01/24/2023)  Depression (PHQ2-9): Low Risk  (01/19/2020)  Tobacco Use: Medium Risk (02/02/2023)   SDOH Interventions:     Readmission Risk Interventions     No data to display

## 2023-02-11 NOTE — Progress Notes (Signed)
67 year old Caucasian female with hypertension, hyperlipidemia, CAD, h/o right carotid endarterectomy, h/o breast cancer, HFrEF, moderate to severe AS, s/p mid LAD PCI 01/29/2023, acute ST and cardiac arrest 01/31/2023 requiring aspiration thrombectomy and IVUS guided stent expansion 01/31/2023, s/p TAVR 02/04/2023, now with acute DVT in Lt internal jugular along with acute superficial vein thrombosis involving the left cephalic vein, proximal upper arm to mid forearm.     With the need for anticoagulation, triple therapy is necessary. Given her h/o ST on Plavix, I still recommend Aspirin and Brilinta, along with DOAC at least for 1 month. After that, could discontinue Aspirin and continue Brilinta and DOAC until DVT treatment is completed. Following this, recommend resuming Aspirin 81 mg daily, along with Brilinta and continue at least till 01/2024, possibly longer.   Madison Negus, MD Pager: [phone] Office: [phone]

## 2023-02-11 NOTE — Progress Notes (Signed)
Occupational Therapy Treatment Patient Details Name: Madison West MRN: [id number] DOB: Mar 16, 1956 Today's Date: 02/11/2023   History of present illness Pt is a 67 y.o. female admitted 01/24/23 with acute CHF. S/p LHC 8/11 with mid LAD 95% lesion, stent placed. VTACH arrest x2 on 8/13 requiring CPR and multiple shocks for ROSC; found to have NSTEMI. S/p IABP placement 8/13-8/17. S/p TAVR 8/15. ETT 8/13-8/18. PMH includes PSVT, CAD, HTN, HLD, breast CA s/p lumpectomy, chemo, anxiety, depression.   OT comments  Patient with good progress toward patient focused goals.  Patient able to walk to the bathroom this session with Min A of 2 and Fara Boros.  Total assist with hygiene.  OT will continue efforts in the acute setting and Patient will benefit from intensive inpatient follow up therapy, >3 hours/day       If plan is discharge home, recommend the following:  Two people to help with walking and/or transfers;A lot of help with bathing/dressing/bathroom;Assistance with cooking/housework;Direct supervision/assist for medications management;Direct supervision/assist for financial management;Assist for transportation   Equipment Recommendations       Recommendations for Other Services      Precautions / Restrictions Precautions Precautions: Fall;Other (comment) Precaution Comments: multiple lines/leads, high anxiety Restrictions Weight Bearing Restrictions: No       Mobility Bed Mobility                 Patient Response: Cooperative  Transfers Overall transfer level: Needs assistance Equipment used: Bilateral platform walker Transfers: Sit to/from Stand, Bed to chair/wheelchair/BSC Sit to Stand: Mod assist     Step pivot transfers: Min assist, +2 physical assistance, +2 safety/equipment           Balance Overall balance assessment: Needs assistance Sitting-balance support: Single extremity supported, Feet supported Sitting balance-Leahy Scale: Fair      Standing balance support: Reliant on assistive device for balance Standing balance-Leahy Scale: Poor                             ADL either performed or assessed with clinical judgement   ADL       Grooming: Set up;Sitting                       Toileting- Clothing Manipulation and Hygiene: Total assistance              Extremity/Trunk Assessment Upper Extremity Assessment Upper Extremity Assessment: Generalized weakness   Lower Extremity Assessment Lower Extremity Assessment: Defer to PT evaluation   Cervical / Trunk Assessment Cervical / Trunk Assessment: Kyphotic    Vision Patient Visual Report: No change from baseline     Perception Perception Perception: Not tested   Praxis Praxis Praxis: Not tested    Cognition Arousal: Alert Behavior During Therapy: Flat affect, Anxious Overall Cognitive Status: Within Functional Limits for tasks assessed                                          Exercises      Shoulder Instructions       General Comments      Pertinent Vitals/ Pain       Pain Assessment Pain Assessment: Faces Faces Pain Scale: Hurts little more Pain Location: L arm Pain Descriptors / Indicators: Discomfort, Sore Pain Intervention(s): Monitored during session  Frequency  Min 1X/week        Progress Toward Goals  OT Goals(current goals can now be found in the care plan section)  Progress towards OT goals: Progressing toward goals  Acute Rehab OT Goals OT Goal Formulation: With patient Time For Goal Achievement: 02/22/23 Potential to Achieve Goals: Good  Plan      Co-evaluation    PT/OT/SLP Co-Evaluation/Treatment: Yes Reason for Co-Treatment: Complexity of the patient's impairments (multi-system involvement);For patient/therapist safety;To address functional/ADL transfers          AM-PAC OT "6 Clicks"  Daily Activity     Outcome Measure   Help from another person eating meals?: None Help from another person taking care of personal grooming?: A Little Help from another person toileting, which includes using toliet, bedpan, or urinal?: A Lot Help from another person bathing (including washing, rinsing, drying)?: A Lot Help from another person to put on and taking off regular upper body clothing?: A Lot Help from another person to put on and taking off regular lower body clothing?: A Lot 6 Click Score: 15    End of Session Equipment Utilized During Treatment: Oxygen;Gait belt  OT Visit Diagnosis: Unsteadiness on feet (R26.81);Other abnormalities of gait and mobility (R26.89);Muscle weakness (generalized) (M62.81)   Activity Tolerance Patient tolerated treatment well   Patient Left in chair;with call bell/phone within reach;with chair alarm set   Nurse Communication Mobility status        Time: 4332-9518 OT Time Calculation (min): 23 min  Charges: OT General Charges $OT Visit: 1 Visit OT Treatments $Self Care/Home Management : 8-22 mins  02/11/2023  RP, OTR/L  Acute Rehabilitation Services  Office:  [phone]   Suzanna Obey 02/11/2023, 10:01 AM

## 2023-02-11 NOTE — Progress Notes (Signed)
NAME:  Madison West, MRN:  [id number], DOB:  Dec 19, 1955, LOS: 18 ADMISSION DATE:  01/23/2023, CONSULTATION DATE:  02/11/2023 REFERRING MD:  P[facility]rel - TR[facility] CHIEF COMPLAINT:  VT arrest   History of Present Illness:  67 year old woman who presented to [facility] ED 8/9 with SOB. PMHx significant for PSVT, HTN, HLD, R carotid artery stenosis s/p endarterectomy 2020, CAD (s/p stenting 2021), moderate aortic stenosis.  Patient presented with progressively worsening SOB, weight gain and fatigue. Also complaining of anginal chest pain similar to when she needed coronary intervention back in 2021. Admitted to [facility] 8/9 and treated for new onset CHF. Echo showing LVEF 30-35%; +RWMAs, moderate aortic stenosis. BNP 462. EKG showing PVCs, LAE, LAFB w/ minimal ST elevation. Patient underwent cardiac cath 8/7 showing mid LAD 95% lesion with LAD stent placement. CT surgery consulted for severe aortic stenosis. Patient has been having episodes of non sustained VT, started on metoprolol.    On 8/11, patient on floor and had pulseless VT. CPR initiated, given Epi, and shocked x 2 with ROSC. Patient alert after CPR event but slightly confused. Patient again went into VT and was shocked. Given amio, mag, calcium. Patient started on amio drip. Patient transferred to [facility] ICU and cardiology consulted. EKG showing ST elevation in anterior leads. Code STEMI. PCCM consulted.  Required IABP placement 8/13. Ultimately underwent TAVR 8/15.  Pertinent Medical History:  PSVT, anxiety/depression, breast cancer (s/p lumpectomy, chemo and radiation in 2009), HTN, HLD, right carotid artery stenosis status post carotid endarterectomy in 2020, CAD status post stent to LAD 8/7  Significant Hospital Events: Including procedures, antibiotic start and stop dates in addition to other pertinent events   01/31/2023 LHC intervention on the mid LAD. 8/13 Overnight patient developed refractory VT shocked multiple times taken back to Cath Lab,  intra-aortic balloon pump placed.  Stabilized intolerant of inotropes due to recurrent VT on amiodarone lidocaine and Neo-Synephrine. Was taken for consideration of VAECMO and then the decision was to not cannulate 8/15 Plans for TAVR today  8/16 Doing better, weaning pressors, remains intubated, IABP in place 8/17 IABP removed 8/18 Extubated 8/19 Swan removed, Lasix gtt stopped. PICC placed. 8/20 MBS. Weaning milrinone. Started spiro and losartan.  8/21 Foley replaced, appreciate Uro NP's help. L internal jugular DVT and L cephalic SVT     Interim History / Subjective:  NAEO  Na is a bit low at 128 today Coox is 77 on 0.25 milrinone AKI is improved    Feels weak, feel that cutting her pancakes is a real chore   Objective:  Blood pressure 129/81, pulse (!) 102, temperature 98.6 F (37 C), temperature source Axillary, resp. rate (!) 26, height 5\' 8"  (1.727 m), weight 102.1 kg, SpO2 92%.        Intake/Output Summary (Last 24 hours) at 02/11/2023 1026 Last data filed at 02/11/2023 0900 Gross per 24 hour  Intake 2305.06 ml  Output 2710 ml  Net -404.94 ml   Filed Weights   02/09/23 0630 02/10/23 0500 02/11/23 0500  Weight: 99.1 kg 101.2 kg 102.1 kg   Physical Examination: General: acutely and chronically ill F NAD  HEENT: NCAT pink mm  Neuro: AAOx4 generalized weakness no focal def  CV: rr cap refill < 3sec  PULM: Even unlabored. Scattered rhonchi  GI: soft ndnt hypoactive  Extremities: LUE edema, erythema  Skin: c/d/w   Resolved Hospital Problem List:    Assessment & Plan:    VT VT arrest Cardiogenic shock, improved Acute HFrEF Severe  AS ACS HTN  -s/p LAD PCI DES 8/9  -TAVR 8/15 - IABP out 8/17  P -adv HF following: -PO amio -weaning milrinone to 0.125 -follow coox -60mg  lasix -spiro, entresto  -crestor  -follow coox -optimize lytes -on triple therapy: eliquis, ASA, plavix. Cards rec is ASA brilinta (instead of plavix) + DOAC x 1 mo, then brilinta +  DOAC  Acute resp failure w hypoxia Enterobacter cloacae PNA Dysphagia  P -wean O2 as able -really needs to mobilize -IS, flutter -PRN duoneb -as above, lasix   Anemia, stable  -follow CBC PRN   Acute  Hyponatremia  P -follow BMP  -restricting free water; overall volume status is up  -send urine studies and osm if cont to decline   Hyperglycemia  - SSI  L Internal Jugular DVT L cephalic SVT  -LUE pain due to above  -LUE PICC, some swelling and localized pain -vanc/rocephin started by Adv HF for possible cellulitis P -de-escalate abx  -eliquis + DAPT   Urinary retention  -really appreciate uro's help with foley placement 8/20   Constipation -incr bowel reg again 8/22 -if no BM, consider Relistor 8/23   Anxiety  - Continue Buspar - Xanax TID PRN (must be crushed in puree), Ativan IV if needed -adding PRN atarax   Physical deconditioning Sleep disturbance -PT/OT, nutr status  -melatonin  -delirium precautions  -might be a good LTAC candidate   Best Practice (right click and "Reselect all SmartList Selections" daily)   Diet/type: reg  DVT prophylaxis: heparin ppx GI prophylaxis: N/A Lines: N/A Foley:  N/A Code Status:  full code Last date of multidisciplinary goals of care discussion: continue to update daughters. Pt updated 8/21  CRITICAL CARE Performed by: Lanier Clam   Total critical care time: 37 minutes  Critical care time was exclusive of separately billable procedures and treating other patients. Critical care was necessary to treat or prevent imminent or life-threatening deterioration.  Critical care was time spent personally by me on the following activities: development of treatment plan with patient and/or surrogate as well as nursing, discussions with consultants, evaluation of patient's response to treatment, examination of patient, obtaining history from patient or surrogate, ordering and performing treatments and interventions,  ordering and review of laboratory studies, ordering and review of radiographic studies, pulse oximetry and re-evaluation of patient's condition.  Tessie Fass MSN, AGACNP-BC [facility] Pulmonary/Critical Care Medicine Amion for pager 02/11/2023, 10:26 AM

## 2023-02-11 NOTE — Progress Notes (Signed)
Patient ID: [name], female   DOB: 1955-10-04, 67 y.o.   MRN: [id number]     Advanced Heart Failure Rounding Note  PCP-Cardiologist: None   Subjective:    - 8/15 s/p TAVR. Coronary angiogram w/ widely patent stent. Post op echo w/ no paravalvular leak. - Echo (8/16): EF 35-40%, mild LVH, WMAs, RV normal, bioprosthetic aortic valve s/p TAVR with mean gradient 13 mmH, trivial PVL, IVC mildly dilated.  - IABP removed 8/17 - extubated 8/18  On milrinone 0.25. Co-ox 77%.   SCr/K stable w/ GDMT additions. BP stable.   Completed abx on 8/20 for PNA, Enterobacter cloacae on trach aspirate culture.   Remains anxious, sitting in chair position in bed. O2 stable on HFNC.   Objective:   Weight Range: 102.1 kg Body mass index is 34.22 kg/m.   Vital Signs:   Temp:  [98 F (36.7 C)-98.9 F (37.2 C)] 98.5 F (36.9 C) (08/22 0400) Pulse Rate:  [75-90] 84 (08/22 0600) Resp:  [13-29] 29 (08/22 0600) BP: (101-172)/(59-95) 110/63 (08/22 0600) SpO2:  [90 %-97 %] 93 % (08/22 0600) Weight:  [102.1 kg] 102.1 kg (08/22 0500) Last BM Date : 02/01/23  Weight change: Filed Weights   02/09/23 0630 02/10/23 0500 02/11/23 0500  Weight: 99.1 kg 101.2 kg 102.1 kg    Intake/Output:   Intake/Output Summary (Last 24 hours) at 02/11/2023 0752 Last data filed at 02/11/2023 0600 Gross per 24 hour  Intake 2360.66 ml  Output 2590 ml  Net -229.34 ml    Physical Exam  General:  anxious appearing.   HEENT: normal. +HFNC Neck: supple. JVD ~8 cm. Carotids 2+ bilat; no bruits. No lymphadenopathy or thyromegaly appreciated. Cor: PMI nondisplaced. Regular rate & rhythm. No rubs, gallops or murmurs. Lungs: diminished  Abdomen: soft, nontender, nondistended. No hepatosplenomegaly. No bruits or masses. Good bowel sounds. Extremities: no cyanosis, clubbing, rash, edema. PICC LUE GU: +foley Neuro: alert & oriented x 3, cranial nerves grossly intact. moves all 4 extremities w/o difficulty. Affect  pleasant.   Telemetry   ST low 100s (Personally reviewed)    Labs    CBC Recent Labs    02/10/23 0449 02/11/23 0508  WBC 9.2 8.4  HGB 8.6* 8.7*  HCT 26.5* 25.9*  MCV 96.4 96.3  PLT 325 331   Basic Metabolic Panel Recent Labs    65/78/46 0404 02/10/23 0449 02/11/23 0508  NA 136 134* 128*  K 3.8 3.9 4.1  CL 99 96* 92*  CO2 28 30 29   GLUCOSE 114* 152* 162*  BUN 19 22 25*  CREATININE 0.83 1.02* 0.97  CALCIUM 8.7* 9.0 8.8*  MG 2.5* 2.2  --   PHOS  --  4.6 4.8*   Liver Function Tests Recent Labs    02/11/23 0508  ALBUMIN 2.3*    No results for input(s): "LIPASE", "AMYLASE" in the last 72 hours. Cardiac Enzymes No results for input(s): "CKTOTAL", "CKMB", "CKMBINDEX", "TROPONINI" in the last 72 hours.  BNP: BNP (last 3 results) Recent Labs    01/23/23 2208  BNP 462.3*   ProBNP (last 3 results) No results for input(s): "PROBNP" in the last 8760 hours.  D-Dimer No results for input(s): "DDIMER" in the last 72 hours. Hemoglobin A1C No results for input(s): "HGBA1C" in the last 72 hours.  Fasting Lipid Panel No results for input(s): "CHOL", "HDL", "LDLCALC", "TRIG", "CHOLHDL", "LDLDIRECT" in the last 72 hours.  Thyroid Function Tests No results for input(s): "TSH", "T4TOTAL", "T3FREE", "THYROIDAB" in the last 72 hours.  Invalid input(s): "FREET3"  Other results:  Imaging    VAS [place] UPPER EXTREMITY VENOUS DUPLEX  Result Date: 02/10/2023 UPPER VENOUS STUDY  Patient Name:  NETRA VESSELS  Date of Exam:   02/10/2023 Medical Rec #: [id number]            Accession #:    [id number] Date of Birth: 1956-01-28            Patient Gender: F Patient Age:   7 years Exam Location:  [facility] Procedure:      VAS [place] UPPER EXTREMITY VENOUS DUPLEX Referring Phys: Levon Hedger --------------------------------------------------------------------------------  Indications: Pain, and Swelling Limitations: PICC line placement/bandage. Comparison Study: No previous  study. Performing Technologist: McKayla Maag RVT, [place]  Examination Guidelines: A complete evaluation includes B-mode imaging, spectral Doppler, color Doppler, and power Doppler as needed of all accessible portions of each vessel. Bilateral testing is considered an integral part of a complete examination. Limited examinations for reoccurring indications may be performed as noted.  Right Findings: +----------+------------+---------+-----------+----------+-------+ RIGHT     CompressiblePhasicitySpontaneousPropertiesSummary +----------+------------+---------+-----------+----------+-------+ Subclavian    Full       Yes       Yes                      +----------+------------+---------+-----------+----------+-------+  Left Findings: +----------+------------+---------+-----------+----------+-------+ LEFT      CompressiblePhasicitySpontaneousPropertiesSummary +----------+------------+---------+-----------+----------+-------+ IJV         Partial      Yes       Yes               Acute  +----------+------------+---------+-----------+----------+-------+ Subclavian    Full                                          +----------+------------+---------+-----------+----------+-------+ Axillary      Full                                          +----------+------------+---------+-----------+----------+-------+ Brachial      Full                                  limited +----------+------------+---------+-----------+----------+-------+ Radial        Full                                          +----------+------------+---------+-----------+----------+-------+ Ulnar         Full                                          +----------+------------+---------+-----------+----------+-------+ Cephalic    Partial      No        No                Acute  +----------+------------+---------+-----------+----------+-------+ Basilic       Full                                   limited +----------+------------+---------+-----------+----------+-------+  Summary:  Right: No evidence of thrombosis in the subclavian.  Left: Findings consistent with acute deep vein thrombosis involving the left internal jugular vein. Findings consistent with acute superficial vein thrombosis involving the left cephalic vein, proximal upper arm to mid forearm.  *See table(s) above for measurements and observations.  Diagnosing physician: Heath Lark Electronically signed by Heath Lark on 02/10/2023 at 8:52:28 PM.    Final      Medications:   Scheduled Medications:  amiodarone  400 mg Oral BID   apixaban  10 mg Oral BID   Followed by   Melene Muller ON 02/17/2023] apixaban  5 mg Oral BID   aspirin  81 mg Oral Daily   busPIRone  10 mg Oral BID   Chlorhexidine Gluconate Cloth  6 each Topical Daily   clopidogrel  75 mg Oral Daily   docusate  100 mg Oral BID   famotidine  20 mg Oral BID   feeding supplement  237 mL Oral BID BM   FLUoxetine  20 mg Oral QHS   gabapentin  300 mg Oral TID   insulin aspart  2-6 Units Subcutaneous Q4H   melatonin  3 mg Oral Daily   methocarbamol  500 mg Oral BID   mouth rinse  15 mL Mouth Rinse 4 times per day   oxymetazoline  1 spray Each Nare BID   polyethylene glycol  17 g Oral BID   rosuvastatin  20 mg Oral Daily   sacubitril-valsartan  1 tablet Oral BID   sodium chloride flush  10-40 mL Intracatheter Q12H   sodium chloride flush  3 mL Intravenous Q12H   sodium chloride flush  3 mL Intravenous Q12H   spironolactone  25 mg Oral Daily    Infusions:  sodium chloride     sodium chloride Stopped (02/08/23 0416)   sodium chloride     sodium chloride     sodium chloride     albumin human Stopped (02/03/23 1032)   cefTRIAXone (ROCEPHIN)  IV Stopped (02/10/23 1209)   milrinone 0.25 mcg/kg/min (02/11/23 0726)   norepinephrine (LEVOPHED) Adult infusion Stopped (02/08/23 0503)   sodium chloride     vancomycin Stopped (02/10/23 2256)    PRN  Medications: sodium chloride, sodium chloride, Place/Maintain arterial line **AND** sodium chloride, sodium chloride, acetaminophen, albumin human, ALPRAZolam, alum & mag hydroxide-simeth, bisacodyl, calcium carbonate, hydrALAZINE, HYDROmorphone (DILAUDID) injection, lidocaine, lidocaine, LORazepam, nitroGLYCERIN, ondansetron (ZOFRAN) IV, mouth rinse, oxyCODONE, sodium chloride flush, sodium chloride flush, sodium chloride flush  Patient Profile  67 y.o. female with history of CAD s/p PCI to RCA in 2021, carotid artery stenosis s/p R CEA, aortic valve stenosis. Admitted with unstable angina. Found to have severe LAD stenosis s/p PCI and severe low flow low gradient AS. Coarse c/b refractory VT and hemodynamic instability >> cardiogenic shock. Assessment/Plan  Acute systolic CHF >> cardiogenic shock - In setting of severe ischemic heart disease, reduced EF, severe AS and refractory VT/VF - Echo 01/25/23: EF 30-35%, septum and apex HK, RV low normal, low flow low graident severe AS with mean gradient 20 mmHg, AVA 0.93 cm2, DI 0.33 - echo 02/01/23: EF 20-25%, RV okay, severe low flow low gradient severe AS with mean gradient 25 mmHg, AVA 1.1 cm2 - s/p PCI/stent to LAD. Stent thrombosis on 08/09 treated with thrombectomy.  - IABP placed 08/13. Had considered [facility] ECMO but hemodynamics improved with inotrope support - S/p TAVR 8/15 - Echo 8/16 with EF 35-40%, mild LVH, WMAs, RV normal, bioprosthetic aortic valve s/p  TAVR with mean gradient 13 mmH, trivial PVL, IVC mildly dilated.  - IABP removed 8/17 - Continues on 0.25 milrinone. Co-ox 77%. Wean down to 0.125 - Volume status slighlty elevated, repeat 60 IV lasix today - Plan to SGLT2i once foley removed (replaced yesterday with urinary retention) - Continue spiro 12.5 mg daily  - Continue Entresto 24-26 bid  - No BB for now with low output  2. Refractory VT - S/p multiple shocks.  - No further VT over the last day  - Continue po amio 400 mg BID   - Replace K/Mg as needed  3. CAD NSTEMI -HX PCI to RCA in 2021 -Presented with unstable angina. S/p PCI/stent to LAD on 08/07. Stent thrombosis 08/09 treated with thrombectomy -HS troponin > 24K on 08/12 -LHC 08/12 with patent LAD stent -LAD appeared widely patent on TAVR angiogram -Continue DAPT and statin.    4. LFLG Severe AS -Noted on echo this admit -Not candidate for SAVR -s/p TAVR 8/15, valve stable on 8/15 echo with trivial PVL and low gradient.   5. Carotid artery stenosis -S/p R CEA in 2020  6. ID -Developed sepsis 08/13. Completed cefepime + vancomycin -Fever resolved.  -Enterobacter cloacae in trach aspirate.  -Now w/ suspected LUE cellulitis, restart abx   7. Respiratory failure - Extubated 8/18, stable on HFNC - Add duoneb today - encourage incentive spirometer use  8. Hyponatremia -suspected hypervolemic hyponatremia -128 today - restrict free water  9. LUE Swelling  - painful, + erythema and increased warmth  - suspect cellulitis, will initiate abx - check venous doppler for DVT r/o    Alen Bleacher, NP  7:52 AM

## 2023-02-12 ENCOUNTER — Inpatient Hospital Stay (HOSPITAL_COMMUNITY): Payer: Medicare Other

## 2023-02-12 ENCOUNTER — Other Ambulatory Visit (HOSPITAL_COMMUNITY): Payer: Self-pay

## 2023-02-12 DIAGNOSIS — J9601 Acute respiratory failure with hypoxia: Secondary | ICD-10-CM

## 2023-02-12 DIAGNOSIS — R5381 Other malaise: Secondary | ICD-10-CM

## 2023-02-12 DIAGNOSIS — I5020 Unspecified systolic (congestive) heart failure: Secondary | ICD-10-CM | POA: Diagnosis not present

## 2023-02-12 DIAGNOSIS — R570 Cardiogenic shock: Secondary | ICD-10-CM | POA: Diagnosis not present

## 2023-02-12 LAB — BASIC METABOLIC PANEL
Anion gap: 8 (ref 5–15)
BUN: 21 mg/dL (ref 8–23)
CO2: 30 mmol/L (ref 22–32)
Calcium: 8.5 mg/dL — ABNORMAL LOW (ref 8.9–10.3)
Chloride: 90 mmol/L — ABNORMAL LOW (ref 98–111)
Creatinine, Ser: 0.83 mg/dL (ref 0.44–1.00)
GFR, Estimated: >60 mL/min (ref >60)
Glucose, Bld: 128 mg/dL — ABNORMAL HIGH (ref 70–99)
Potassium: 4.4 mmol/L (ref 3.5–5.1)
Sodium: 128 mmol/L — ABNORMAL LOW (ref 135–145)

## 2023-02-12 LAB — GLUCOSE, CAPILLARY
Glucose-Capillary: 122 mg/dL — ABNORMAL HIGH (ref 70–99)
Glucose-Capillary: 122 mg/dL — ABNORMAL HIGH (ref 70–99)
Glucose-Capillary: 129 mg/dL — ABNORMAL HIGH (ref 70–99)
Glucose-Capillary: 131 mg/dL — ABNORMAL HIGH (ref 70–99)
Glucose-Capillary: 141 mg/dL — ABNORMAL HIGH (ref 70–99)
Glucose-Capillary: 172 mg/dL — ABNORMAL HIGH (ref 70–99)

## 2023-02-12 LAB — RENAL FUNCTION PANEL
Albumin: 2.2 g/dL — ABNORMAL LOW (ref 3.5–5.0)
Anion gap: 7 (ref 5–15)
BUN: 22 mg/dL (ref 8–23)
CO2: 29 mmol/L (ref 22–32)
Calcium: 8.4 mg/dL — ABNORMAL LOW (ref 8.9–10.3)
Chloride: 91 mmol/L — ABNORMAL LOW (ref 98–111)
Creatinine, Ser: 0.97 mg/dL (ref 0.44–1.00)
GFR, Estimated: >60 mL/min (ref >60)
Glucose, Bld: 120 mg/dL — ABNORMAL HIGH (ref 70–99)
Phosphorus: 3.5 mg/dL (ref 2.5–4.6)
Potassium: 4.4 mmol/L (ref 3.5–5.1)
Sodium: 127 mmol/L — ABNORMAL LOW (ref 135–145)

## 2023-02-12 LAB — COOXEMETRY PANEL
Carboxyhemoglobin: 2.5 % — ABNORMAL HIGH (ref 0.5–1.5)
Methemoglobin: 0.9 % (ref 0.0–1.5)
O2 Saturation: 76.1 %
Total hemoglobin: 9.2 g/dL — ABNORMAL LOW (ref 12.0–16.0)

## 2023-02-12 LAB — OSMOLALITY, URINE: Osmolality, Ur: 437 mOsm/kg (ref 300–900)

## 2023-02-12 LAB — SODIUM, URINE, RANDOM: Sodium, Ur: <10 mmol/L

## 2023-02-12 LAB — OSMOLALITY: Osmolality: 281 mOsm/kg (ref 275–295)

## 2023-02-12 MED ORDER — FUROSEMIDE 10 MG/ML IJ SOLN
60.0000 mg | Freq: Once | INTRAMUSCULAR | Status: AC
  Administered 2023-02-12: 60 mg via INTRAVENOUS
  Filled 2023-02-12: qty 6

## 2023-02-12 MED ORDER — POTASSIUM CHLORIDE CRYS ER 20 MEQ PO TBCR
20.0000 meq | EXTENDED_RELEASE_TABLET | Freq: Once | ORAL | Status: AC
  Administered 2023-02-12: 20 meq via ORAL
  Filled 2023-02-12: qty 1

## 2023-02-12 MED ORDER — DOCUSATE SODIUM 100 MG PO CAPS
100.0000 mg | ORAL_CAPSULE | Freq: Two times a day (BID) | ORAL | Status: DC
  Administered 2023-02-12 – 2023-02-18 (×10): 100 mg via ORAL
  Filled 2023-02-12 (×12): qty 1

## 2023-02-12 MED ORDER — SACUBITRIL-VALSARTAN 49-51 MG PO TABS
1.0000 | ORAL_TABLET | Freq: Two times a day (BID) | ORAL | Status: DC
  Administered 2023-02-12 – 2023-02-15 (×7): 1 via ORAL
  Filled 2023-02-12 (×8): qty 1

## 2023-02-12 NOTE — Progress Notes (Signed)
Patient ID: Madison West, female   DOB: [birth year], 67 y.o.   MRN: [id number]     Advanced Heart Failure Rounding Note  PCP-Cardiologist: None   Subjective:    - 8/15 s/p TAVR. Coronary angiogram w/ widely patent stent. Post op echo w/ no paravalvular leak. - Echo (8/16): EF 35-40%, mild LVH, WMAs, RV normal, bioprosthetic aortic valve s/p TAVR with mean gradient 13 mmH, trivial PVL, IVC mildly dilated.  - IABP removed 8/17 - extubated 8/18  On milrinone 0.125. Co-ox 76%.   SCr/K stable w/ GDMT additions. BP elevated.   Completed abx on 8/20 for PNA, Enterobacter cloacae on trach aspirate culture.   Still requiring high O2 requirements. Resting comforts ably in bed, less anxious today.   Objective:   Weight Range: 103.9 kg Body mass index is 34.83 kg/m.   Vital Signs:   Temp:  [97.8 F (36.6 C)-98.9 F (37.2 C)] 98.4 F (36.9 C) (08/23 0751) Pulse Rate:  [74-109] 95 (08/23 0800) Resp:  [13-34] 22 (08/23 0800) BP: (103-158)/(57-97) 158/72 (08/23 0800) SpO2:  [87 %-97 %] 94 % (08/23 0800) Weight:  [103.9 kg] 103.9 kg (08/23 0500) Last BM Date : 02/11/23  Weight change: Filed Weights   02/10/23 0500 02/11/23 0500 02/12/23 0500  Weight: 101.2 kg 102.1 kg 103.9 kg    Intake/Output:   Intake/Output Summary (Last 24 hours) at 02/12/2023 0904 Last data filed at 02/12/2023 0800 Gross per 24 hour  Intake 542.18 ml  Output 1630 ml  Net -1087.82 ml    Physical Exam  General:  well appearing.  HEENT: normal +HFNC Neck: supple. JVD ~12 cm. Carotids 2+ bilat; no bruits. No lymphadenopathy or thyromegaly appreciated. Cor: PMI nondisplaced. Regular rate & rhythm. No rubs, gallops or murmurs. Lungs: clear Abdomen: soft, nontender, nondistended. No hepatosplenomegaly. No bruits or masses. Good bowel sounds. Extremities: no cyanosis, clubbing, rash, edema. PICC RUE GU: +foley Neuro: alert & oriented x 3, cranial nerves grossly intact. moves all 4 extremities w/o  difficulty. Affect pleasant.   Telemetry   NSR 90s (Personally reviewed)    Labs    CBC Recent Labs    02/10/23 0449 02/11/23 0508  WBC 9.2 8.4  HGB 8.6* 8.7*  HCT 26.5* 25.9*  MCV 96.4 96.3  PLT 325 331   Basic Metabolic Panel Recent Labs    24/40/10 0449 02/11/23 0508 02/12/23 0440  NA 134* 128* 127*  K 3.9 4.1 4.4  CL 96* 92* 91*  CO2 30 29 29   GLUCOSE 152* 162* 120*  BUN 22 25* 22  CREATININE 1.02* 0.97 0.97  CALCIUM 9.0 8.8* 8.4*  MG 2.2  --   --   PHOS 4.6 4.8* 3.5   Liver Function Tests Recent Labs    02/11/23 0508 02/12/23 0440  ALBUMIN 2.3* 2.2*    No results for input(s): "LIPASE", "AMYLASE" in the last 72 hours. Cardiac Enzymes No results for input(s): "CKTOTAL", "CKMB", "CKMBINDEX", "TROPONINI" in the last 72 hours.  BNP: BNP (last 3 results) Recent Labs    01/23/23 2208  BNP 462.3*   ProBNP (last 3 results) No results for input(s): "PROBNP" in the last 8760 hours.  D-Dimer No results for input(s): "DDIMER" in the last 72 hours. Hemoglobin A1C No results for input(s): "HGBA1C" in the last 72 hours.  Fasting Lipid Panel No results for input(s): "CHOL", "HDL", "LDLCALC", "TRIG", "CHOLHDL", "LDLDIRECT" in the last 72 hours.  Thyroid Function Tests No results for input(s): "TSH", "T4TOTAL", "T3FREE", "THYROIDAB" in the last 72  hours.  Invalid input(s): "FREET3"  Other results:  Imaging    DG CHEST PORT 1 VIEW  Result Date: 02/12/2023 CLINICAL DATA:  [id number] with acute respiratory failure with hypoxia. EXAM: PORTABLE CHEST 1 VIEW COMPARISON:  Portable chest 02/10/2023 at 5:58 a.m. FINDINGS: 4:41 a.m. Left PICC terminates above the superior cavoatrial junction. There are overlying monitor wires. There is a TAVR stent in place. Cardiomegaly with mild central vascular fullness. There are small pleural effusions and patchy airspace disease in the mid to lower lung fields. No new or worsening lung opacity is seen. The mediastinum is  stable with aortic tortuosity and calcifications. No new osseous findings. Compare: Overall aeration seems unchanged. IMPRESSION: Unchanged cardiomegaly with mild central vascular fullness. Small pleural effusions and patchy airspace disease in the mid to lower lung fields. No new or worsening lung opacity. Electronically Signed   By: Almira Bar M.D.   On: 02/12/2023 05:44     Medications:   Scheduled Medications:  amiodarone  400 mg Oral BID   apixaban  10 mg Oral BID   Followed by   Melene Muller ON 02/17/2023] apixaban  5 mg Oral BID   aspirin  81 mg Oral Daily   busPIRone  10 mg Oral BID   Chlorhexidine Gluconate Cloth  6 each Topical Daily   clopidogrel  75 mg Oral Daily   docusate sodium  100 mg Oral BID   famotidine  20 mg Oral BID   feeding supplement  237 mL Oral BID BM   FLUoxetine  20 mg Oral QHS   furosemide  60 mg Intravenous Once   gabapentin  300 mg Oral TID   insulin aspart  2-6 Units Subcutaneous Q4H   melatonin  3 mg Oral Daily   methocarbamol  500 mg Oral BID   mouth rinse  15 mL Mouth Rinse 4 times per day   oxymetazoline  1 spray Each Nare BID   polyethylene glycol  17 g Oral BID   potassium chloride  20 mEq Oral Once   rosuvastatin  20 mg Oral Daily   sacubitril-valsartan  1 tablet Oral BID   senna-docusate  1 tablet Oral BID   sodium chloride flush  10-40 mL Intracatheter Q12H   sodium chloride flush  3 mL Intravenous Q12H   sodium chloride flush  3 mL Intravenous Q12H   sorbitol  30 mL Oral Once   spironolactone  25 mg Oral Daily    Infusions:  sodium chloride     sodium chloride Stopped (02/08/23 0416)   sodium chloride     sodium chloride     sodium chloride     albumin human Stopped (02/03/23 1032)   sodium chloride      PRN Medications: sodium chloride, sodium chloride, Place/Maintain arterial line **AND** sodium chloride, sodium chloride, acetaminophen, albumin human, ALPRAZolam, alum & mag hydroxide-simeth, bisacodyl, calcium carbonate,  hydrALAZINE, HYDROmorphone (DILAUDID) injection, hydrOXYzine, ipratropium-albuterol, lidocaine, lidocaine, LORazepam, nitroGLYCERIN, ondansetron (ZOFRAN) IV, mouth rinse, mouth rinse, oxyCODONE, sodium chloride flush, sodium chloride flush, sodium chloride flush  Patient Profile  67 y.o. female with history of CAD s/p PCI to RCA in 2021, carotid artery stenosis s/p R CEA, aortic valve stenosis. Admitted with unstable angina. Found to have severe LAD stenosis s/p PCI and severe low flow low gradient AS. Coarse c/b refractory VT and hemodynamic instability >> cardiogenic shock. Assessment/Plan  Acute systolic CHF >> cardiogenic shock - In setting of severe ischemic heart disease, reduced EF, severe AS and refractory VT/VF -  Echo 01/25/23: EF 30-35%, septum and apex HK, RV low normal, low flow low graident severe AS with mean gradient 20 mmHg, AVA 0.93 cm2, DI 0.33 - echo 02/01/23: EF 20-25%, RV okay, severe low flow low gradient severe AS with mean gradient 25 mmHg, AVA 1.1 cm2 - s/p PCI/stent to LAD. Stent thrombosis on 08/09 treated with thrombectomy.  - IABP placed 08/13. Had considered [facility] ECMO but hemodynamics improved with inotrope support - S/p TAVR 8/15 - Echo 8/16 with EF 35-40%, mild LVH, WMAs, RV normal, bioprosthetic aortic valve s/p TAVR with mean gradient 13 mmH, trivial PVL, IVC mildly dilated.  - IABP removed 8/17 - Continues on 0.125 milrinone. Co-ox 76%. Turn off milrinone today - Volume status slighlty elevated, repeat 60 IV lasix today, will reevaluate in the afternoon, suspect she needs more - Plan to SGLT2i once foley removed (replaced 8/21 with urinary retention) - Continue spiro 25 mg daily  - Increase Entresto 24-26>49-51 bid  - No BB for now with low output  2. Refractory VT - S/p multiple shocks.  - No further VT over the last day  - Continue po amio 400 mg BID  - Replace K/Mg as needed  3. CAD NSTEMI -HX PCI to RCA in 2021 -Presented with unstable angina. S/p  PCI/stent to LAD on 08/07. Stent thrombosis 08/09 treated with thrombectomy -HS troponin > 24K on 08/12 -LHC 08/12 with patent LAD stent -LAD appeared widely patent on TAVR angiogram -Continue DAPT and statin.    4. LFLG Severe AS -Noted on echo this admit -Not candidate for SAVR -s/p TAVR 8/15, valve stable on 8/15 echo with trivial PVL and low gradient.   5. Carotid artery stenosis -S/p R CEA in 2020  6. ID -Developed sepsis 08/13. Completed cefepime + vancomycin -Fever resolved.  -Enterobacter cloacae in trach aspirate.  -Now w/ suspected LUE cellulitis, restart abx   7. Respiratory failure - Extubated 8/18, stable on HFNC. Wean O2 requirements as tolerated - encourage incentive spirometer use  8. Hyponatremia -suspected hypervolemic hyponatremia -127 today - restrict free water  9. LUE Swelling  - painful, + erythema and increased warmth  - suspect cellulitis, will initiate abx - check venous doppler for DVT r/o   Alen Bleacher, NP  9:04 AM

## 2023-02-12 NOTE — TOC Benefit Eligibility Note (Signed)
Pharmacy Patient Advocate Encounter  Insurance verification completed.    The patient is insured through [employer]. Patient has Medicare and is not eligible for a copay card, but may be able to apply for patient assistance, if available.    Ran test claim for Eliquis and the current 30 day co-pay is $107.23.   This test claim was processed through [facility]- copay amounts may vary at other pharmacies due to pharmacy/plan contracts, or as the patient moves through the different stages of their insurance plan.

## 2023-02-12 NOTE — Progress Notes (Signed)
Inpatient Rehab Admissions Coordinator:  Pt is not medically ready for CIR admission. Pt on 15L HFNC. Will continue to follow to monitor for medical readiness.  Wolfgang Phoenix, MS, CCC-SLP Admissions Coordinator [phone]

## 2023-02-12 NOTE — Progress Notes (Signed)
Physical Therapy Treatment Patient Details Name: Madison West MRN: [id number] DOB: 1955/09/18 Today's Date: 02/12/2023   History of Present Illness Pt is a 67 y.o. female admitted 01/24/23 with acute CHF. S/p LHC 8/11 with mid LAD 95% lesion, stent placed. VTACH arrest x2 on 8/13 requiring CPR and multiple shocks for ROSC; found to have NSTEMI. S/p IABP placement 8/13-8/17. S/p TAVR 8/15. ETT 8/13-8/18. PMH includes PSVT, CAD, HTN, HLD, breast CA s/p lumpectomy, chemo, anxiety, depression.    PT Comments  Patient progressing to hallway ambulation with RW.  Still very slow and laborious and with high O2 requirement.  She pushed past fatigue after seated rest, but was even slower and more difficulty lifting her feet.  She has still chest soreness from CPR so applied heat.  She will continue to benefit from skilled PT in the acute setting.  Inpatient rehab recommended at d/c.     If plan is discharge home, recommend the following: A lot of help with walking and/or transfers;A lot of help with bathing/dressing/bathroom;Assistance with cooking/housework;Assist for transportation;Help with stairs or ramp for entrance   Can travel by private vehicle        Equipment Recommendations  Other (comment) (TBA)    Recommendations for Other Services       Precautions / Restrictions Precautions Precautions: Fall Precaution Comments: multiple lines/leads, high anxiety Restrictions Weight Bearing Restrictions: No     Mobility  Bed Mobility               General bed mobility comments: up in recliner    Transfers Overall transfer level: Needs assistance Equipment used: Rolling walker (2 wheels) Transfers: Sit to/from Stand Sit to Stand: Mod assist           General transfer comment: some lifting help, pt using arms, but with pain in chest    Ambulation/Gait Ambulation/Gait assistance: Min assist, +2 safety/equipment Gait Distance (Feet): 45 Feet (&15') Assistive device:  Rolling walker (2 wheels) Gait Pattern/deviations: Step-to pattern, Decreased stride length, Trunk flexed, Shuffle       General Gait Details: very slow and difficulty picking up her legs stating she feels so heavy and like she is 67 y/o; seated rest, on 100% NRB with ambulation   Stairs             Wheelchair Mobility     Tilt Bed    Modified Rankin (Stroke Patients Only)       Balance Overall balance assessment: Needs assistance   Sitting balance-Leahy Scale: Good     Standing balance support: Bilateral upper extremity supported Standing balance-Leahy Scale: Poor Standing balance comment: UE support and assist for balance                            Cognition Arousal: Alert Behavior During Therapy: Anxious Overall Cognitive Status: Within Functional Limits for tasks assessed                                          Exercises      General Comments General comments (skin integrity, edema, etc.): 10L HFNC at rest, on 100% NRB for ambulation with SpO2 wavering with poor pleth while pt using her hands up when resting in chair to 90's; pt talking throughout and encouraged to breath and not talk during activity      Pertinent Vitals/Pain  Pain Assessment Pain Assessment: Faces Faces Pain Scale: Hurts even more Pain Location: L Arm and chest Pain Descriptors / Indicators: Discomfort, Sore Pain Intervention(s): Monitored during session, Repositioned, Heat applied    Home Living                          Prior Function            PT Goals (current goals can now be found in the care plan section) Progress towards PT goals: Progressing toward goals    Frequency    Min 1X/week      PT Plan      Co-evaluation              AM-PAC PT "6 Clicks" Mobility   Outcome Measure  Help needed turning from your back to your side while in a flat bed without using bedrails?: A Lot Help needed moving from lying on your  back to sitting on the side of a flat bed without using bedrails?: A Lot Help needed moving to and from a bed to a chair (including a wheelchair)?: A Lot Help needed standing up from a chair using your arms (e.g., wheelchair or bedside chair)?: A Lot Help needed to walk in hospital room?: A Lot Help needed climbing 3-5 steps with a railing? : Total 6 Click Score: 11    End of Session Equipment Utilized During Treatment: Gait belt;Oxygen Activity Tolerance: Patient limited by fatigue Patient left: in chair;with call bell/phone within reach   PT Visit Diagnosis: Other abnormalities of gait and mobility (R26.89);Muscle weakness (generalized) (M62.81)     Time: 1510-1540 PT Time Calculation (min) (ACUTE ONLY): 30 min  Charges:    $Gait Training: 8-22 mins $Therapeutic Activity: 8-22 mins PT General Charges $$ ACUTE PT VISIT: 1 Visit                     Madison West, PT Acute Rehabilitation Services Office:[phone] 02/12/2023    Madison West 02/12/2023, 5:21 PM

## 2023-02-12 NOTE — Progress Notes (Signed)
NAME:  Madison West, MRN:  [id number], DOB:  11-04-1955, LOS: 19 ADMISSION DATE:  01/23/2023, CONSULTATION DATE:  02/12/2023 REFERRING MD:  Pokhrel - [facility] CHIEF COMPLAINT:  VT arrest   History of Present Illness:  67 year old woman who presented to [facility] ED 8/9 with SOB. PMHx significant for PSVT, HTN, HLD, R carotid artery stenosis s/p endarterectomy 2020, CAD (s/p stenting 2021), moderate aortic stenosis.  Patient presented with progressively worsening SOB, weight gain and fatigue. Also complaining of anginal chest pain similar to when she needed coronary intervention back in 2021. Admitted to [facility] 8/9 and treated for new onset CHF. Echo showing LVEF 30-35%; +RWMAs, moderate aortic stenosis. BNP 462. EKG showing PVCs, LAE, LAFB w/ minimal ST elevation. Patient underwent cardiac cath 8/7 showing mid LAD 95% lesion with LAD stent placement. CT surgery consulted for severe aortic stenosis. Patient has been having episodes of non sustained VT, started on metoprolol.    On 8/11, patient on floor and had pulseless VT. CPR initiated, given Epi, and shocked x 2 with ROSC. Patient alert after CPR event but slightly confused. Patient again went into VT and was shocked. Given amio, mag, calcium. Patient started on amio drip. Patient transferred to [facility] ICU and cardiology consulted. EKG showing ST elevation in anterior leads. Code STEMI. PCCM consulted.  Required IABP placement 8/13. Ultimately underwent TAVR 8/15.  Pertinent Medical History:  PSVT, anxiety/depression, breast cancer (s/p lumpectomy, chemo and radiation in 2009), HTN, HLD, right carotid artery stenosis status post carotid endarterectomy in 2020, CAD status post stent to LAD 8/7  Significant Hospital Events: Including procedures, antibiotic start and stop dates in addition to other pertinent events   01/31/2023 LHC intervention on the mid LAD. 8/13 Overnight patient developed refractory VT shocked multiple times taken back to Cath Lab,  intra-aortic balloon pump placed.  Stabilized intolerant of inotropes due to recurrent VT on amiodarone lidocaine and Neo-Synephrine. Was taken for consideration of VAECMO and then the decision was to not cannulate 8/15 Plans for TAVR today  8/16 Doing better, weaning pressors, remains intubated, IABP in place 8/17 IABP removed 8/18 Extubated 8/19 Swan removed, Lasix gtt stopped. PICC placed. 8/20 MBS. Weaning milrinone. Started spiro and losartan.  8/21 Foley replaced, appreciate Uro NP's help. L internal jugular DVT and L cephalic SVT    8/22 milrinone wean to 0.125. Had a BM  Interim History / Subjective:   Had a BM yesterday evening.   Coox is 76 on 0.125 milrinone  Na down a bit to 127 Put out about 1.5L after 80 lasix   On 15 HFNC with spot 88-97 Lots of anxiety   Objective:  Blood pressure (!) 148/76, pulse 90, temperature 98.4 F (36.9 C), temperature source Oral, resp. rate (!) 24, height 5\' 8"  (1.727 m), weight 103.9 kg, SpO2 93%.        Intake/Output Summary (Last 24 hours) at 02/12/2023 1126 Last data filed at 02/12/2023 1000 Gross per 24 hour  Intake 538.29 ml  Output 2230 ml  Net -1691.71 ml   Filed Weights   02/10/23 0500 02/11/23 0500 02/12/23 0500  Weight: 101.2 kg 102.1 kg 103.9 kg   Physical Examination: General:  Neuro: HEENT: CV: Pulm: GI: GU: MSK: Skin:   Resolved Hospital Problem List:    Assessment & Plan:     VT  VT arrest Cardiogenic shock  - improved, IABP out 8/17 Acute HFrEF Severe AS, sp TAVR 8/15  ACS sp LAD PCI DES 8/9  HTN  P -  adv HF following: -PO amio -come off milrinone 8/23 -repeat lasix  -spiro, incr entresto  -crestor  -follow coox -optimize lytes -on triple therapy: ASA plavix eliquis  Acute resp failure with hypoxia Enterobacter cloacae PNA sp 7d cefepime  Dysphagia  Small bilateral pleural effusions BLL ASD, unchanged  + some vascular prominence  P -wean O2 as able  -repeat lasix  -really needs  to mobilize -IS, flutter -PRN duoneb -tentatively holding in ICU w O2 req but think she is teetering line for SDU; ideally think LTAC would actually be best next step given degree of debility at this point   Anemia, stable  -follow CBC PRN   Acute Hyponatremia  P -follow BMP  -restricting free water; overall volume status is up  -sending urine studies   Hyperglycemia  - SSI  L Internal Jugular DVT L cephalic SVT  -LUE pain due to above  P -eliquis + DAPT   Urinary retention  -really appreciate uro's help with foley placement 8/20   Constipation, improving  -had a BM on 8/22 -- will cont current reg, no escalation to relistor   Anxiety  - Buspar, PRN BZD PRN atarax   Physical deconditioning Sleep disturbance -PT/OT, nutr status  -melatonin  -delirium precautions  -might be a good LTAC candidate    Best Practice (right click and "Reselect all SmartList Selections" daily)   Diet/type: reg  DVT prophylaxis: heparin ppx GI prophylaxis: N/A Lines: N/A Foley:  N/A Code Status:  full code Last date of multidisciplinary goals of care discussion: continue to update daughters. Pt updated 8/21  CCT na    Tessie Fass MSN, AGACNP-BC [facility] Pulmonary/Critical Care Medicine Amion for pager 02/12/2023, 11:26 AM

## 2023-02-13 ENCOUNTER — Inpatient Hospital Stay (HOSPITAL_COMMUNITY): Payer: Medicare Other

## 2023-02-13 DIAGNOSIS — J9601 Acute respiratory failure with hypoxia: Secondary | ICD-10-CM | POA: Diagnosis not present

## 2023-02-13 LAB — GLUCOSE, CAPILLARY
Glucose-Capillary: 120 mg/dL — ABNORMAL HIGH (ref 70–99)
Glucose-Capillary: 140 mg/dL — ABNORMAL HIGH (ref 70–99)
Glucose-Capillary: >600 mg/dL (ref 70–99)
Glucose-Capillary: 118 mg/dL — ABNORMAL HIGH (ref 70–99)
Glucose-Capillary: 119 mg/dL — ABNORMAL HIGH (ref 70–99)
Glucose-Capillary: 122 mg/dL — ABNORMAL HIGH (ref 70–99)
Glucose-Capillary: 140 mg/dL — ABNORMAL HIGH (ref 70–99)

## 2023-02-13 LAB — RENAL FUNCTION PANEL
Albumin: 2.4 g/dL — ABNORMAL LOW (ref 3.5–5.0)
Anion gap: 8 (ref 5–15)
BUN: 21 mg/dL (ref 8–23)
CO2: 31 mmol/L (ref 22–32)
Calcium: 8.4 mg/dL — ABNORMAL LOW (ref 8.9–10.3)
Chloride: 89 mmol/L — ABNORMAL LOW (ref 98–111)
Creatinine, Ser: 1.05 mg/dL — ABNORMAL HIGH (ref 0.44–1.00)
GFR, Estimated: 58 mL/min — ABNORMAL LOW (ref >60)
Glucose, Bld: 116 mg/dL — ABNORMAL HIGH (ref 70–99)
Phosphorus: 3.7 mg/dL (ref 2.5–4.6)
Potassium: 4.4 mmol/L (ref 3.5–5.1)
Sodium: 128 mmol/L — ABNORMAL LOW (ref 135–145)

## 2023-02-13 LAB — COOXEMETRY PANEL
Carboxyhemoglobin: 2.6 % — ABNORMAL HIGH (ref 0.5–1.5)
Methemoglobin: <0.7 % (ref 0.0–1.5)
O2 Saturation: 68.3 %
Total hemoglobin: 8.9 g/dL — ABNORMAL LOW (ref 12.0–16.0)

## 2023-02-13 MED ORDER — ORAL CARE MOUTH RINSE: 15.0000 mL | OROMUCOSAL | Status: DC | PRN

## 2023-02-13 MED ORDER — LIDOCAINE 5 % EX PTCH
2.0000 | MEDICATED_PATCH | Freq: Two times a day (BID) | CUTANEOUS | Status: DC | PRN
  Administered 2023-02-14 – 2023-02-15 (×2): 2 via TRANSDERMAL
  Filled 2023-02-13 (×3): qty 2

## 2023-02-13 MED ORDER — ACETAMINOPHEN 325 MG PO TABS
650.0000 mg | ORAL_TABLET | Freq: Four times a day (QID) | ORAL | Status: DC
  Administered 2023-02-13 – 2023-02-16 (×10): 650 mg via ORAL
  Administered 2023-02-17: 325 mg via ORAL
  Administered 2023-02-17 – 2023-02-18 (×4): 650 mg via ORAL
  Filled 2023-02-13 (×16): qty 2

## 2023-02-13 MED ORDER — FUROSEMIDE 10 MG/ML IJ SOLN
40.0000 mg | Freq: Once | INTRAMUSCULAR | Status: AC
  Administered 2023-02-13: 40 mg via INTRAVENOUS
  Filled 2023-02-13: qty 4

## 2023-02-13 NOTE — Progress Notes (Signed)
NAME:  Madison West, MRN:  [id number], DOB:  10-01-1955, LOS: 20 ADMISSION DATE:  01/23/2023, CONSULTATION DATE:  02/13/2023 REFERRING MD:  Pokhrel - [facility] CHIEF COMPLAINT:  VT arrest   History of Present Illness:  67 year old woman who presented to [facility] ED 8/9 with SOB. PMHx significant for PSVT, HTN, HLD, R carotid artery stenosis s/p endarterectomy 2020, CAD (s/p stenting 2021), moderate aortic stenosis.  Patient presented with progressively worsening SOB, weight gain and fatigue. Also complaining of anginal chest pain similar to when she needed coronary intervention back in 2021. Admitted to [facility] 8/9 and treated for new onset CHF. Echo showing LVEF 30-35%; +RWMAs, moderate aortic stenosis. BNP 462. EKG showing PVCs, LAE, LAFB w/ minimal ST elevation. Patient underwent cardiac cath 8/7 showing mid LAD 95% lesion with LAD stent placement. CT surgery consulted for severe aortic stenosis. Patient has been having episodes of non sustained VT, started on metoprolol.    On 8/11, patient on floor and had pulseless VT. CPR initiated, given Epi, and shocked x 2 with ROSC. Patient alert after CPR event but slightly confused. Patient again went into VT and was shocked. Given amio, mag, calcium. Patient started on amio drip. Patient transferred to [facility] ICU and cardiology consulted. EKG showing ST elevation in anterior leads. Code STEMI. PCCM consulted.  Required IABP placement 8/13. Ultimately underwent TAVR 8/15.  Pertinent Medical History:  PSVT, anxiety/depression, breast cancer (s/p lumpectomy, chemo and radiation in 2009), HTN, HLD, right carotid artery stenosis status post carotid endarterectomy in 2020, CAD status post stent to LAD 8/7  Significant Hospital Events: Including procedures, antibiotic start and stop dates in addition to other pertinent events   01/31/2023 LHC intervention on the mid LAD. 8/13 Overnight patient developed refractory VT shocked multiple times taken back to Cath Lab,  intra-aortic balloon pump placed.  Stabilized intolerant of inotropes due to recurrent VT on amiodarone lidocaine and Neo-Synephrine. Was taken for consideration of VAECMO and then the decision was to not cannulate 8/15 Plans for TAVR today  8/16 Doing better, weaning pressors, remains intubated, IABP in place 8/17 IABP removed 8/18 Extubated 8/19 Swan removed, Lasix gtt stopped. PICC placed. 8/20 MBS. Weaning milrinone. Started spiro and losartan.  8/21 Foley replaced, appreciate Uro NP's help. L internal jugular DVT and L cephalic SVT    8/22 milrinone wean to 0.125. Had a BM 8/24 off all vasoactives. O2 down to 4Lpm  Interim History / Subjective:   Weaned O2 to 4lpm. Was able to walk yesterday. Complains of significant chest pain on coughing as well as left breast pain which she relates to her arm DVT.   Objective:  Blood pressure (!) 137/54, pulse 83, temperature 98.7 F (37.1 C), temperature source Oral, resp. rate (!) 31, height 5\' 8"  (1.727 m), weight 102.1 kg, SpO2 95%.        Intake/Output Summary (Last 24 hours) at 02/13/2023 1445 Last data filed at 02/13/2023 1421 Gross per 24 hour  Intake 560 ml  Output 4001 ml  Net -3441 ml   Filed Weights   02/11/23 0500 02/12/23 0500 02/13/23 0500  Weight: 102.1 kg 103.9 kg 102.1 kg   Physical Examination: General: asthenic woman lying in bed. Obese Neuro: generalized weakness but no focal deficits  HEENT: moist mucosae CV: no JVD. HS normal Pulm: few crackles at bases GI: soft JK:KXFGH urine, Foley for retention   Ancillary test personally reviewed:  Na 128 Creatinine 1.05 Still has increased marking on CXR.  SCV O2 68.3 off  all inotropic support Assessment & Plan:   VT  VT arrest Cardiogenic shock  - improved, IABP out 8/17 Acute HFrEF Severe AS, sp TAVR 8/15  ACS sp LAD PCI DES 8/9  HTN  Acute resp failure with hypoxia Enterobacter cloacae PNA sp 7d cefepime  Dysphagia  Small bilateral pleural effusions BLL  ASD, unchanged  + some vascular prominence  Anemia, stable  Acute Hyponatremia  Hyperglycemia  L Internal Jugular DVT L cephalic SVT  Urinary retention - urology placed Foley.  Constipation, improving  MSK chest pain  Anxiety  Physical deconditioning Sleep disturbance  Plan:   - Continue to wean O2 to keep saturation greater than 92%.   -Suspect much of her hypoxia is still atelectasis related.  Sats seem to improve when she is taking deep breaths. -Diurese again gently today although no clear signs of volume overload and creatinine starting to rise indicating that we may have reached our diuresis limit. -Continue sacubitril-valsartan, spironolactone for heart failure.  Should be able to tolerate low-dose beta-blocker as well to optimize HFrEF management. -Continue amiodarone 400 twice daily for VT VF.  can stepdown to 400 daily for maintenance. -Continue as needed bronchodilators.  No wheezing so no indication for steroids -Complete plan 7-day course of antibiotics for pneumonia. -Added topical lidocaine for chest pain.  Already on extensive multimodal pain regimen. -Continue anxiolytic medication. -Very deconditioned, needs aggressive physiotherapy.  Motivation appears to be improving. -On apixaban for DVT.  Best Practice (right click and "Reselect all SmartList Selections" daily)   Diet/type: reg  DVT prophylaxis: heparin ppx GI prophylaxis: N/A Lines: N/A Foley:  N/A Code Status:  full code Last date of multidisciplinary goals of care discussion: continue to update daughters. Pt updated 8/21  Lynnell Catalan, MD [facility] ICU Physician [facility]  Pager: [phone] Or Epic Secure Chat After hours: [phone].  02/13/2023, 2:56 PM

## 2023-02-13 NOTE — Plan of Care (Signed)
  Problem: Education: Goal: Knowledge of General Education information will improve Description: Including pain rating scale, medication(s)/side effects and non-pharmacologic comfort measures Outcome: Progressing   Problem: Health Behavior/Discharge Planning: Goal: Ability to manage health-related needs will improve Outcome: Progressing   Problem: Clinical Measurements: Goal: Ability to maintain clinical measurements within normal limits will improve Outcome: Progressing Goal: Will remain free from infection Outcome: Progressing Goal: Diagnostic test results will improve Outcome: Progressing Goal: Respiratory complications will improve Outcome: Progressing Goal: Cardiovascular complication will be avoided Outcome: Progressing   Problem: Nutrition: Goal: Adequate nutrition will be maintained Outcome: Progressing   Problem: Coping: Goal: Level of anxiety will decrease Outcome: Progressing   Problem: Elimination: Goal: Will not experience complications related to bowel motility Outcome: Progressing Goal: Will not experience complications related to urinary retention Outcome: Progressing   Problem: Pain Managment: Goal: General experience of comfort will improve Outcome: Progressing   Problem: Safety: Goal: Ability to remain free from injury will improve Outcome: Progressing   Problem: Skin Integrity: Goal: Risk for impaired skin integrity will decrease Outcome: Progressing   Problem: Education: Goal: Understanding of CV disease, CV risk reduction, and recovery process will improve Outcome: Progressing Goal: Individualized Educational Video(s) Outcome: Progressing   Problem: Activity: Goal: Ability to return to baseline activity level will improve Outcome: Progressing   Problem: Cardiovascular: Goal: Ability to achieve and maintain adequate cardiovascular perfusion will improve Outcome: Progressing Goal: Vascular access site(s) Level 0-1 will be  maintained Outcome: Progressing   Problem: Health Behavior/Discharge Planning: Goal: Ability to safely manage health-related needs after discharge will improve Outcome: Progressing   Problem: Cardiac: Goal: Ability to achieve and maintain adequate cardiopulmonary perfusion will improve Outcome: Progressing Goal: Vascular access site(s) Level 0-1 will be maintained Outcome: Progressing   Problem: Fluid Volume: Goal: Ability to achieve a balanced intake and output will improve Outcome: Progressing   Problem: Physical Regulation: Goal: Complications related to the disease process, condition or treatment will be avoided or minimized Outcome: Progressing   Problem: Respiratory: Goal: Will regain and/or maintain adequate ventilation Outcome: Progressing

## 2023-02-14 DIAGNOSIS — J9601 Acute respiratory failure with hypoxia: Secondary | ICD-10-CM | POA: Diagnosis not present

## 2023-02-14 LAB — RENAL FUNCTION PANEL
Albumin: 2.4 g/dL — ABNORMAL LOW (ref 3.5–5.0)
Anion gap: 6 (ref 5–15)
BUN: 21 mg/dL (ref 8–23)
CO2: 33 mmol/L — ABNORMAL HIGH (ref 22–32)
Calcium: 8.7 mg/dL — ABNORMAL LOW (ref 8.9–10.3)
Chloride: 90 mmol/L — ABNORMAL LOW (ref 98–111)
Creatinine, Ser: 0.83 mg/dL (ref 0.44–1.00)
GFR, Estimated: >60 mL/min (ref >60)
Glucose, Bld: 101 mg/dL — ABNORMAL HIGH (ref 70–99)
Phosphorus: 4.4 mg/dL (ref 2.5–4.6)
Potassium: 4.1 mmol/L (ref 3.5–5.1)
Sodium: 129 mmol/L — ABNORMAL LOW (ref 135–145)

## 2023-02-14 LAB — GLUCOSE, CAPILLARY
Glucose-Capillary: 88 mg/dL (ref 70–99)
Glucose-Capillary: 118 mg/dL — ABNORMAL HIGH (ref 70–99)
Glucose-Capillary: 177 mg/dL — ABNORMAL HIGH (ref 70–99)
Glucose-Capillary: 103 mg/dL — ABNORMAL HIGH (ref 70–99)
Glucose-Capillary: 95 mg/dL (ref 70–99)

## 2023-02-14 LAB — COOXEMETRY PANEL
Carboxyhemoglobin: 1.4 % (ref 0.5–1.5)
Methemoglobin: <0.7 % (ref 0.0–1.5)
O2 Saturation: 60.4 %
Total hemoglobin: 8.8 g/dL — ABNORMAL LOW (ref 12.0–16.0)

## 2023-02-14 LAB — CBC WITH DIFFERENTIAL/PLATELET
Abs Immature Granulocytes: 0.15 10*3/uL — ABNORMAL HIGH (ref 0.00–0.07)
Basophils Absolute: 0.1 10*3/uL (ref 0.0–0.1)
Basophils Relative: 1 %
Eosinophils Absolute: 0.7 10*3/uL — ABNORMAL HIGH (ref 0.0–0.5)
Eosinophils Relative: 9 %
HCT: 28.2 % — ABNORMAL LOW (ref 36.0–46.0)
Hemoglobin: 9.1 g/dL — ABNORMAL LOW (ref 12.0–15.0)
Immature Granulocytes: 2 %
Lymphocytes Relative: 16 %
Lymphs Abs: 1.3 10*3/uL (ref 0.7–4.0)
MCH: 31.1 pg (ref 26.0–34.0)
MCHC: 32.3 g/dL (ref 30.0–36.0)
MCV: 96.2 fL (ref 80.0–100.0)
Monocytes Absolute: 0.7 10*3/uL (ref 0.1–1.0)
Monocytes Relative: 9 %
Neutro Abs: 5.1 10*3/uL (ref 1.7–7.7)
Neutrophils Relative %: 63 %
Platelets: 424 10*3/uL — ABNORMAL HIGH (ref 150–400)
RBC: 2.93 MIL/uL — ABNORMAL LOW (ref 3.87–5.11)
RDW: 13.9 % (ref 11.5–15.5)
WBC: 8.1 10*3/uL (ref 4.0–10.5)
nRBC: 0 % (ref 0.0–0.2)

## 2023-02-14 LAB — BRAIN NATRIURETIC PEPTIDE: B Natriuretic Peptide: 418.7 pg/mL — ABNORMAL HIGH (ref 0.0–100.0)

## 2023-02-14 MED ORDER — INSULIN ASPART 100 UNIT/ML IJ SOLN: 2.0000 [IU] | Freq: Three times a day (TID) | INTRAMUSCULAR | Status: DC

## 2023-02-14 NOTE — Plan of Care (Signed)
  Problem: Education: Goal: Knowledge of General Education information will improve Description: Including pain rating scale, medication(s)/side effects and non-pharmacologic comfort measures Outcome: Progressing   Problem: Health Behavior/Discharge Planning: Goal: Ability to manage health-related needs will improve Outcome: Progressing   Problem: Clinical Measurements: Goal: Ability to maintain clinical measurements within normal limits will improve Outcome: Progressing Goal: Will remain free from infection Outcome: Progressing Goal: Diagnostic test results will improve Outcome: Progressing Goal: Respiratory complications will improve Outcome: Progressing Goal: Cardiovascular complication will be avoided Outcome: Progressing   Problem: Activity: Goal: Risk for activity intolerance will decrease Outcome: Progressing   Problem: Nutrition: Goal: Adequate nutrition will be maintained Outcome: Progressing   Problem: Elimination: Goal: Will not experience complications related to bowel motility Outcome: Progressing Goal: Will not experience complications related to urinary retention Outcome: Progressing   Problem: Pain Managment: Goal: General experience of comfort will improve Outcome: Progressing   Problem: Safety: Goal: Ability to remain free from injury will improve Outcome: Progressing   Problem: Skin Integrity: Goal: Risk for impaired skin integrity will decrease Outcome: Progressing   Problem: Education: Goal: Understanding of CV disease, CV risk reduction, and recovery process will improve Outcome: Progressing Goal: Individualized Educational Video(s) Outcome: Progressing   Problem: Activity: Goal: Ability to return to baseline activity level will improve Outcome: Progressing   Problem: Cardiovascular: Goal: Ability to achieve and maintain adequate cardiovascular perfusion will improve Outcome: Progressing Goal: Vascular access site(s) Level 0-1 will be  maintained Outcome: Progressing   Problem: Health Behavior/Discharge Planning: Goal: Ability to safely manage health-related needs after discharge will improve Outcome: Progressing   Problem: Cardiac: Goal: Ability to achieve and maintain adequate cardiopulmonary perfusion will improve Outcome: Progressing Goal: Vascular access site(s) Level 0-1 will be maintained Outcome: Progressing   Problem: Fluid Volume: Goal: Ability to achieve a balanced intake and output will improve Outcome: Progressing   Problem: Physical Regulation: Goal: Complications related to the disease process, condition or treatment will be avoided or minimized Outcome: Progressing   Problem: Respiratory: Goal: Will regain and/or maintain adequate ventilation Outcome: Progressing

## 2023-02-14 NOTE — Progress Notes (Signed)
NAME:  Madison West, MRN:  [id number], DOB:  Nov 13, 1955, LOS: 21 ADMISSION DATE:  01/23/2023, CONSULTATION DATE:  02/14/2023 REFERRING MD:  Pokhrel - TRH CHIEF COMPLAINT:  VT arrest   History of Present Illness:  67 year old woman who presented to [facility] ED 8/9 with SOB. PMHx significant for PSVT, HTN, HLD, R carotid artery stenosis s/p endarterectomy 2020, CAD (s/p stenting 2021), moderate aortic stenosis.  Patient presented with progressively worsening SOB, weight gain and fatigue. Also complaining of anginal chest pain similar to when she needed coronary intervention back in 2021. Admitted to [facility] 8/9 and treated for new onset CHF. Echo showing LVEF 30-35%; +RWMAs, moderate aortic stenosis. BNP 462. EKG showing PVCs, LAE, LAFB w/ minimal ST elevation. Patient underwent cardiac cath 8/7 showing mid LAD 95% lesion with LAD stent placement. CT surgery consulted for severe aortic stenosis. Patient has been having episodes of non sustained VT, started on metoprolol.    On 8/11, patient on floor and had pulseless VT. CPR initiated, given Epi, and shocked x 2 with ROSC. Patient alert after CPR event but slightly confused. Patient again went into VT and was shocked. Given amio, mag, calcium. Patient started on amio drip. Patient transferred to [facility] ICU and cardiology consulted. EKG showing ST elevation in anterior leads. Code STEMI. PCCM consulted.  Required IABP placement 8/13. Ultimately underwent TAVR 8/15.  Pertinent Medical History:  PSVT, anxiety/depression, breast cancer (s/p lumpectomy, chemo and radiation in 2009), HTN, HLD, right carotid artery stenosis status post carotid endarterectomy in 2020, CAD status post stent to LAD 8/7  Significant Hospital Events: Including procedures, antibiotic start and stop dates in addition to other pertinent events   01/31/2023 LHC intervention on the mid LAD. 8/13 Overnight patient developed refractory VT shocked multiple times taken back to Cath Lab,  intra-aortic balloon pump placed.  Stabilized intolerant of inotropes due to recurrent VT on amiodarone lidocaine and Neo-Synephrine. Was taken for consideration of VAECMO and then the decision was to not cannulate 8/15 Plans for TAVR today  8/16 Doing better, weaning pressors, remains intubated, IABP in place 8/17 IABP removed 8/18 Extubated 8/19 Swan removed, Lasix gtt stopped. PICC placed. 8/20 MBS. Weaning milrinone. Started spiro and losartan.  8/21 Foley replaced, appreciate Uro NP's help. L internal jugular DVT and L cephalic SVT    8/22 milrinone wean to 0.125. Had a BM 8/24 off all vasoactives. O2 down to 4Lpm  Interim History / Subjective:   Weaned O2 to 4lpm. Was able to walk yesterday. Complains of significant chest pain on coughing as well as left breast pain which she relates to her arm DVT.   Objective:  Blood pressure (!) 161/84, pulse 76, temperature 98.9 F (37.2 C), temperature source Oral, resp. rate (!) 26, height 5\' 8"  (1.727 m), weight 100.2 kg, SpO2 95%.        Intake/Output Summary (Last 24 hours) at 02/14/2023 1106 Last data filed at 02/14/2023 0800 Gross per 24 hour  Intake 840 ml  Output 4800 ml  Net -3960 ml   Filed Weights   02/12/23 0500 02/13/23 0500 02/14/23 0706  Weight: 103.9 kg 102.1 kg 100.2 kg   Physical Examination: General: asthenic woman lying in bed. Obese Neuro: generalized weakness but no focal deficits  HEENT: moist mucosae CV: no JVD. HS normal Pulm: few crackles at bases GI: soft XB:JYNWG urine, Foley for retention   Ancillary test personally reviewed:  Na 128 Creatinine 0.83, CO2 33 Still has increased marking on CXR.  SCV O2  68.3 off all inotropic support Assessment & Plan:   VT  VT arrest Cardiogenic shock  - improved, IABP out 8/17 Acute HFrEF Severe AS, sp TAVR 8/15  ACS sp LAD PCI DES 8/9  HTN  Acute resp failure with hypoxia Enterobacter cloacae PNA sp 7d cefepime  Dysphagia  Small bilateral pleural  effusions BLL ASD, unchanged  + some vascular prominence  Anemia, stable  Acute Hyponatremia  Hyperglycemia  L Internal Jugular DVT L cephalic SVT  Urinary retention - urology placed Foley.  Constipation, improving  MSK chest pain from prior CPR Anxiety  Physical deconditioning Sleep disturbance  Plan:   -Continue to wean O2 to keep saturation greater than 92%.   -Suspect much of her hypoxia is still atelectasis related.  Sats seem to improve when she is taking deep breaths. -Hold further diuresis as not volume overloaded clinically.  -Continue sacubitril-valsartan, spironolactone for heart failure. AHF team is following.  -Continue amiodarone 400 twice daily for VT VF.  Can stepdown to 400 daily for maintenance. -Continue as needed bronchodilators.   -Complete plan 7-day course of antibiotics for pneumonia. -Continue extensive multimodal pain regimen. -Continue anxiolytic medication. -Very deconditioned, needs aggressive physiotherapy.  Motivation appears to be improving. -On apixaban for DVT. - Urology placed Foley - contact them prior to removal.  - Ready for transfer - Hospitalist contacted.   Best Practice (right click and "Reselect all SmartList Selections" daily)   Diet/type: reg  DVT prophylaxis: heparin ppx GI prophylaxis: N/A Lines: N/A Foley:  N/A Code Status:  full code Last date of multidisciplinary goals of care discussion: continue to update daughters. Pt updated 8/25  Lynnell Catalan, MD [facility] ICU Physician [facility]  Pager: [phone] Or Epic Secure Chat After hours: [phone].  02/14/2023, 11:06 AM

## 2023-02-15 ENCOUNTER — Ambulatory Visit: Payer: Medicare Other | Admitting: Cardiovascular Disease

## 2023-02-15 DIAGNOSIS — I5023 Acute on chronic systolic (congestive) heart failure: Secondary | ICD-10-CM

## 2023-02-15 DIAGNOSIS — I82409 Acute embolism and thrombosis of unspecified deep veins of unspecified lower extremity: Secondary | ICD-10-CM

## 2023-02-15 DIAGNOSIS — I350 Nonrheumatic aortic (valve) stenosis: Secondary | ICD-10-CM | POA: Diagnosis not present

## 2023-02-15 DIAGNOSIS — R570 Cardiogenic shock: Secondary | ICD-10-CM | POA: Diagnosis not present

## 2023-02-15 DIAGNOSIS — J189 Pneumonia, unspecified organism: Secondary | ICD-10-CM

## 2023-02-15 DIAGNOSIS — I25110 Atherosclerotic heart disease of native coronary artery with unstable angina pectoris: Secondary | ICD-10-CM | POA: Diagnosis not present

## 2023-02-15 DIAGNOSIS — A419 Sepsis, unspecified organism: Secondary | ICD-10-CM

## 2023-02-15 DIAGNOSIS — F419 Anxiety disorder, unspecified: Secondary | ICD-10-CM | POA: Diagnosis not present

## 2023-02-15 LAB — RENAL FUNCTION PANEL
Albumin: 2.6 g/dL — ABNORMAL LOW (ref 3.5–5.0)
Anion gap: 7 (ref 5–15)
BUN: 15 mg/dL (ref 8–23)
CO2: 32 mmol/L (ref 22–32)
Calcium: 9 mg/dL (ref 8.9–10.3)
Chloride: 91 mmol/L — ABNORMAL LOW (ref 98–111)
Creatinine, Ser: 0.72 mg/dL (ref 0.44–1.00)
GFR, Estimated: >60 mL/min (ref >60)
Glucose, Bld: 127 mg/dL — ABNORMAL HIGH (ref 70–99)
Phosphorus: 4.1 mg/dL (ref 2.5–4.6)
Potassium: 4.9 mmol/L (ref 3.5–5.1)
Sodium: 130 mmol/L — ABNORMAL LOW (ref 135–145)

## 2023-02-15 LAB — GLUCOSE, CAPILLARY
Glucose-Capillary: 108 mg/dL — ABNORMAL HIGH (ref 70–99)
Glucose-Capillary: 104 mg/dL — ABNORMAL HIGH (ref 70–99)
Glucose-Capillary: 129 mg/dL — ABNORMAL HIGH (ref 70–99)
Glucose-Capillary: 113 mg/dL — ABNORMAL HIGH (ref 70–99)

## 2023-02-15 MED ORDER — FUROSEMIDE 40 MG PO TABS
40.0000 mg | ORAL_TABLET | Freq: Every day | ORAL | Status: DC
  Administered 2023-02-15: 40 mg via ORAL
  Filled 2023-02-15: qty 1

## 2023-02-15 MED ORDER — SACUBITRIL-VALSARTAN 97-103 MG PO TABS
1.0000 | ORAL_TABLET | Freq: Two times a day (BID) | ORAL | Status: DC
  Administered 2023-02-15: 1 via ORAL
  Filled 2023-02-15: qty 1

## 2023-02-15 MED ORDER — AMIODARONE HCL 200 MG PO TABS
200.0000 mg | ORAL_TABLET | Freq: Two times a day (BID) | ORAL | Status: DC
  Administered 2023-02-15 – 2023-02-18 (×6): 200 mg via ORAL
  Filled 2023-02-15 (×6): qty 1

## 2023-02-15 NOTE — Plan of Care (Signed)

## 2023-02-15 NOTE — Progress Notes (Addendum)
Progress Note   Patient: Madison West ZOX:[id number] DOB: 1956-03-17 DOA: 01/23/2023     22 DOS: the patient was seen and examined on 02/15/2023   Brief hospital course: Mrs. Hendy was admitted to the hospital with the working diagnosis of heart failure decompensation.   67 yo female with the past medical history of PSVT, anxiety, breast cancer sp lumpectomy/ radiation 2009, hypertension, hyperlipidemia, coronary artery disease, carotid artery disease, and mild to moderate aortic stenosis who presented with dyspnea, weight gain and generalized weakness. She has been not compliant with her medical therapy. On her initial physical examination her blood pressure was 132/88, HR 87, RR 16 and 02 saturation 96%, lungs rhonchi and wheezing, heart with S1 and S2 present and regular, positive systolic murmur at the base, abdomen with no distention and positive lower extremity edema.   Patient was placed on IV diuretic therapy.   Echocardiogram with reduced LV systolic function, 30 to 35% with severe aortic stenosis.  08/07 cardiac catheterization with mid LAD 95% lesion, and underwent stent placement.  CT surgery consulted for severe aortic stenosis.   08/11 patient on floor and had pulseless VT. CPR initiated, given Epi, and shocked x 2 with ROSC. Patient alert after CPR event but slightly confused. Patient again went into VT and was shocked. Given amio, mag, calcium. Patient started on amio drip. Patient transferred to [facility] ICU and cardiology consulted. EKG showing ST elevation in anterior leads. Code STEMI. PCCM consulted.   Required IABP placement 8/13. Ultimately underwent TAVR 8/15.  8/16 Doing better, weaning pressors, remains intubated, IABP in place 8/17 IABP removed 8/18 Extubated 8/19 Swan removed, Lasix gtt stopped. PICC placed. 8/20 MBS. Weaning milrinone. Started spiro and losartan.  8/21 Foley replaced, appreciate Uro NP's help. L internal jugular DVT and L cephalic SVT    8/22  milrinone wean to 0.125. Had a BM 8/24 off all vasoactives. O2 down to 4Lpm  08/26 transferred to [facility]. Pending transfer to CIR.    Assessment and Plan: * Acute on chronic systolic CHF (congestive heart failure) (HCC) Echocardiogram with reduced LV systolic function with EF 35 to 40%, mild LVH, RV systolic function preserved, RVSP 31,6 mmHg, aortic valve replaced. LV with hypokinetic mid and distal anterior wall, entire anterior septum, mid infero septal segment, apical inferior segment, and apex.    During this hospitalization patient cardiogenic shock, required intra aortic balloon pump, 08/13 to 08/17. Milrinone for inotropic support.  Currently on spironolactone, entresto and furosemide.  Holding on SGLT 2 inh due to risk of urinary infection (urinary retention with foley).   Refractive VT.  Sp multiple direct current cardioversion. Now on oral amiodarone.   Telemetry with no further VT.   Acute hypoxemic respiratory failure due to cardiogenic pulmonary edema.  Patient required invasive mechanical ventilation and diuresis.  Currently on 2 L/min per [place].   Coronary artery disease involving native coronary artery of native heart with unstable angina pectoris (HCC) NSTEMI.  LAD sent placed on 08/07 and developed stent thrombosis 08/09 treated with thrombectomy.  08/12 coronary angiography with patent stent.   On dual antiplatelet therapy and statin.   Nonrheumatic aortic valve stenosis SP TAVR with good toleration.   Pure hypercholesterolemia Continue with statin therapy.   Hypertension Continue blood pressure control with entresto.   Bilateral carotid artery stenosis Had right carotid endarterectomy in 2020.    Sepsis due to pneumonia [facility]) Not present on admission.  Enterobacter cloacae in trach aspirate.  Sepsis has resolved,.  Patient  has completed antibiotic therapy with vancomycin and cefepime.   DVT (deep venous thrombosis) (HCC) Left internal jugular vein   Continue with apixaban.   Hyponatremia Renal function with serum cr at 0,72 with K at 4,9 and serum bicarbonate at 32. Na 130  Plan to continue close follow up renal function and electrolytes.         Subjective: Patient with anxiety, she was not able to sleep last night   Physical Exam: Vitals:   02/15/23 0022 02/15/23 0400 02/15/23 0500 02/15/23 0800  BP: (!) 143/65 (!) 100/48  (!) 183/74  Pulse: 82 69  88  Resp:  18  20  Temp:  97.7 F (36.5 C)  98.6 F (37 C)  TempSrc:  Oral  Oral  SpO2: 94% 91%  93%  Weight:  101.1 kg 102.4 kg   Height:       Neurology awake and alert ENT with mild pallor Cardiovascular with S1 and S2 present and regular Respiratory with no rales or wheezing Abdomen with no distention  No lower extremity edema  Data Reviewed:    Family Communication: no family at the bedside   Disposition: Status is: Inpatient Remains inpatient appropriate because: pending transfer to CIR   Planned Discharge Destination: Rehab      Author: Coralie Keens, MD 02/15/2023 5:07 PM  For on call review [email or web address].

## 2023-02-15 NOTE — Progress Notes (Addendum)
Physical Therapy Treatment Patient Details Name: Madison West MRN: [id number] DOB: 03/22/1956 Today's Date: 02/15/2023   History of Present Illness Pt is a 67 y.o. female admitted 01/24/23 with acute CHF. S/p LHC 8/11 with mid LAD 95% lesion, stent placed. VTACH arrest x2 on 8/13 requiring CPR and multiple shocks for ROSC; found to have NSTEMI. S/p IABP placement 8/13-8/17. S/p TAVR 8/15. ETT 8/13-8/18. PMH includes PSVT, CAD, HTN, HLD, breast CA s/p lumpectomy, chemo, anxiety, depression.    PT Comments  Pt improved in ability to perform bed mobility and transfers with less assistance. However, pt had difficulty raising from the toilet likely due to the lower surface and required ModAx2. Pt was able to stand stationary at sink for ~5 min. to perform hygiene. Pt requires frequent cues for safety and rollator management. Pt continues to be limited by fatigue, decreased endurance, and anxiety. Pt Sp02 levels maintained around 89-93 with 2L [place]. Acute PT to follow.    If plan is discharge home, recommend the following: A lot of help with walking and/or transfers;A lot of help with bathing/dressing/bathroom;Assistance with cooking/housework;Assist for transportation;Help with stairs or ramp for entrance   Can travel by private vehicle        Equipment Recommendations  Other (comment) (TBA)    Recommendations for Other Services       Precautions / Restrictions Precautions Precautions: Fall Precaution Comments: multiple lines/leads, high anxiety Restrictions Weight Bearing Restrictions: No     Mobility  Bed Mobility Overal bed mobility: Needs Assistance Bed Mobility: Supine to Sit     Supine to sit: Min assist, HOB elevated     General bed mobility comments: MinA for trunk support. Cues for reaching across her body to roll and to push using UE. Increased time needed Patient Response: Anxious  Transfers Overall transfer level: Needs assistance Equipment used: Rollator (4  wheels) Transfers: Sit to/from Stand Sit to Stand: Mod assist, Min assist, +2 physical assistance (MinA x1 from bed level, ModA x2 from toilet level)           General transfer comment: Pt has difficulty pushing with L UE due to pain, increased time, assist with raise up. Pt required ModA x2 for toilet sit/stand likely due to lower surface. Pt required multiple cues for rollator management.    Ambulation/Gait Ambulation/Gait assistance: Contact guard assist Gait Distance (Feet): 35 Feet (x15, x20) Assistive device: Rollator (4 wheels) Gait Pattern/deviations: Step-to pattern, Decreased stride length, Trunk flexed, Shuffle Gait velocity: dec     General Gait Details: very slow and difficulty picking up her legs stating she feels so heavy and like she is 67 y/o   Psychologist, prison and probation services     Tilt Bed Tilt Bed Patient Response: Anxious  Modified Rankin (Stroke Patients Only)       Balance Overall balance assessment: Needs assistance Sitting-balance support: Feet supported Sitting balance-Leahy Scale: Good Sitting balance - Comments: sitting EOB and on toilet   Standing balance support: Bilateral upper extremity supported, During functional activity, Reliant on assistive device for balance Standing balance-Leahy Scale: Poor Standing balance comment: UE support and assist for balance                            Cognition Arousal: Alert Behavior During Therapy: Anxious Overall Cognitive Status: Within Functional Limits for tasks assessed  General Comments: pt required encouragement and reassurance regarding current mobility status. Pt has known high anxiety        Exercises Other Exercises Other Exercises: bilateral calf stretch in standing for 30 sec at sink    General Comments General comments (skin integrity, edema, etc.): 2L [place] at rest and during activities, SpO2 89-93. Pt given cues for deep breathing       Pertinent Vitals/Pain Pain Assessment Pain Assessment: Faces Faces Pain Scale: Hurts a little bit Pain Location: L Arm and chest Pain Descriptors / Indicators: Discomfort, Sore Pain Intervention(s): Limited activity within patient's tolerance, Heat applied    Home Living                          Prior Function            PT Goals (current goals can now be found in the care plan section) Progress towards PT goals: Progressing toward goals    Frequency    Min 1X/week      PT Plan      Co-evaluation              AM-PAC PT "6 Clicks" Mobility   Outcome Measure  Help needed turning from your back to your side while in a flat bed without using bedrails?: A Little Help needed moving from lying on your back to sitting on the side of a flat bed without using bedrails?: A Lot Help needed moving to and from a bed to a chair (including a wheelchair)?: A Lot Help needed standing up from a chair using your arms (e.g., wheelchair or bedside chair)?: A Lot Help needed to walk in hospital room?: A Little Help needed climbing 3-5 steps with a railing? : Total 6 Click Score: 13    End of Session Equipment Utilized During Treatment: Gait belt;Oxygen Activity Tolerance: Patient limited by fatigue Patient left: in chair;with call bell/phone within reach;with chair alarm set Nurse Communication: Mobility status PT Visit Diagnosis: Other abnormalities of gait and mobility (R26.89);Muscle weakness (generalized) (M62.81)     Time: 4098-1191 PT Time Calculation (min) (ACUTE ONLY): 44 min  Charges:    $Gait Training: 8-22 mins $Therapeutic Activity: 8-22 mins $Self Care/Home Management: 8-22 PT General Charges $$ ACUTE PT VISIT: 1 Visit                     Hilton Cork, PT, DPT Secure Chat Preferred  Rehab Office [phone]   Arturo Morton Brion Aliment 02/15/2023, 2:42 PM

## 2023-02-15 NOTE — Assessment & Plan Note (Signed)
Not present on admission.  Enterobacter cloacae in trach aspirate.  Sepsis has resolved,.  Patient has completed antibiotic therapy with vancomycin and cefepime.

## 2023-02-15 NOTE — Assessment & Plan Note (Signed)
Renal function with serum cr at 0,72 with K at 4,9 and serum bicarbonate at 32. Na 130  Plan to continue close follow up renal function and electrolytes.

## 2023-02-15 NOTE — TOC Progression Note (Signed)
Transition of Care [facility]) - Progression Note    Patient Details  Name: Madison West MRN: [id number] Date of Birth: 08-Oct-1955  Transition of [facility]) CM/SW Contact  Elliot Cousin, RN Phone Number: [phone] 02/15/2023, 12:00 PM  Clinical Narrative:  HF TOC CM spoke to pt and states she does want intense therapy but a little hesitant about moving too fast. Pt is for dc to IP rehab today. Notified [facility]. They will follow for [facility] once she has completed IP rehab. DME will be arranged with by IP rehab CSW.      Expected Discharge Plan: IP Rehab Facility Barriers to Discharge: No Barriers Identified  Expected Discharge Plan and Services In-house Referral: NA Discharge Planning Services: CM Consult Post Acute Care Choice: IP Rehab Living arrangements for the past 2 months: Single Family Home                                       Social Determinants of Health (SDOH) Interventions SDOH Screenings   Food Insecurity: No Food Insecurity (01/24/2023)  Housing: Low Risk  (01/24/2023)  Transportation Needs: No Transportation Needs (01/24/2023)  Utilities: Not At Risk (01/24/2023)  Depression (PHQ2-9): Low Risk  (01/19/2020)  Tobacco Use: Medium Risk (02/02/2023)    Readmission Risk Interventions     No data to display

## 2023-02-15 NOTE — Assessment & Plan Note (Signed)
Left internal jugular vein  Continue with apixaban.   Anemia of chronic disease, stable hgb at 8.3  Thrombocytosis 469, possible reactive.

## 2023-02-15 NOTE — Progress Notes (Signed)
CARDIAC REHAB PHASE I   PRE:  Rate/Rhythm: 81 SR    BP: sitting 119/74    SpO2: 95 2L  MODE:  Ambulation: 80 ft   POST:  Rate/Rhythm: 85 SR    BP: sitting 103/51     SpO2: 92 2L  Pt on BSC. Stood min assist, asked for pericare. Ambulated with rollator, 2L, contact guard. Very slow pace, c/o weakness and pain all over. Pt talkative. Fatigue with distance. To bed after walk.  Discussed/reviewed ed. Will refer to [facility] [facility].  [phone]   Ethelda Chick BS, ACSM-CEP 02/15/2023 3:23 PM

## 2023-02-15 NOTE — Progress Notes (Signed)
Inpatient Rehab Admissions Coordinator:    Spoke with pt. About CIR admission. She declined admission today, feels she's not ready. 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   Megan Salon, MS, CCC-SLP Rehab Admissions Coordinator  [phone] (celll) [phone] (office)

## 2023-02-15 NOTE — Assessment & Plan Note (Signed)
NSTEMI.  LAD sent placed on 08/07 and developed stent thrombosis 08/09 treated with thrombectomy.  08/12 coronary angiography with patent stent.   On dual antiplatelet therapy and statin.

## 2023-02-15 NOTE — Progress Notes (Addendum)
Patient ID: Madison West, female   DOB: 1955-09-27, 67 y.o.   MRN: [id number]     Advanced Heart Failure Rounding Note  PCP-Cardiologist: None   Subjective:   - 8/15 s/p TAVR. Coronary angiogram w/ widely patent stent. Post op echo w/ no paravalvular leak. - Echo (8/16): EF 35-40%, mild LVH, WMAs, RV normal, bioprosthetic aortic valve s/p TAVR with mean gradient 13 mmH, trivial PVL, IVC mildly dilated.  - IABP removed 8/17 - extubated 8/18  Now off milrinone (stopped 8/23). Co-ox 60%  SCr/K stable w/ GDMT additions. BP elevated. SBP 160s, personally checked.    Completed abx on 8/20 for PNA, Enterobacter cloacae on trach aspirate culture.   Resting comfortably in bed. Only on 2L [place].   Objective:   Weight Range: 102.4 kg Body mass index is 34.33 kg/m.   Vital Signs:   Temp:  [97.7 F (36.5 C)-98.6 F (37 C)] 98.6 F (37 C) (08/26 0800) Pulse Rate:  [65-88] 88 (08/26 0800) Resp:  [16-26] 20 (08/26 0800) BP: (100-183)/(48-84) 183/74 (08/26 0800) SpO2:  [91 %-95 %] 93 % (08/26 0800) Weight:  [102.4 kg] 102.4 kg (08/26 0500) Last BM Date : 02/14/23  Weight change: Filed Weights   02/13/23 0500 02/14/23 0706 02/15/23 0500  Weight: 102.1 kg 100.2 kg 102.4 kg    Intake/Output:   Intake/Output Summary (Last 24 hours) at 02/15/2023 0919 Last data filed at 02/15/2023 0439 Gross per 24 hour  Intake 540 ml  Output 2680 ml  Net -2140 ml    Physical Exam  General:  well appearing.  No respiratory difficulty HEENT: normal Neck: supple. JVD ~8 cm. Carotids 2+ bilat; no bruits. No lymphadenopathy or thyromegaly appreciated. Cor: PMI nondisplaced. Regular rate & rhythm. No rubs, gallops or murmurs. Lungs: exp wheeze Abdomen: soft, nontender, nondistended. No hepatosplenomegaly. No bruits or masses. Good bowel sounds. Extremities: no cyanosis, clubbing, rash, edema. PICC LUE GU: +foley Neuro: alert & oriented x 3, cranial nerves grossly intact. moves all 4 extremities  w/o difficulty. Affect pleasant.   Telemetry   NSR 80s (Personally reviewed)    Labs    CBC Recent Labs    02/14/23 0346  WBC 8.1  NEUTROABS 5.1  HGB 9.1*  HCT 28.2*  MCV 96.2  PLT 424*   Basic Metabolic Panel Recent Labs    84/13/24 0531 02/14/23 0346  NA 128* 129*  K 4.4 4.1  CL 89* 90*  CO2 31 33*  GLUCOSE 116* 101*  BUN 21 21  CREATININE 1.05* 0.83  CALCIUM 8.4* 8.7*  PHOS 3.7 4.4   Liver Function Tests Recent Labs    02/13/23 0531 02/14/23 0346  ALBUMIN 2.4* 2.4*    No results for input(s): "LIPASE", "AMYLASE" in the last 72 hours. Cardiac Enzymes No results for input(s): "CKTOTAL", "CKMB", "CKMBINDEX", "TROPONINI" in the last 72 hours.  BNP: BNP (last 3 results) Recent Labs    01/23/23 2208 02/14/23 0346  BNP 462.3* 418.7*   ProBNP (last 3 results) No results for input(s): "PROBNP" in the last 8760 hours.  D-Dimer No results for input(s): "DDIMER" in the last 72 hours. Hemoglobin A1C No results for input(s): "HGBA1C" in the last 72 hours.  Fasting Lipid Panel No results for input(s): "CHOL", "HDL", "LDLCALC", "TRIG", "CHOLHDL", "LDLDIRECT" in the last 72 hours.  Thyroid Function Tests No results for input(s): "TSH", "T4TOTAL", "T3FREE", "THYROIDAB" in the last 72 hours.  Invalid input(s): "FREET3"  Other results:  Imaging    No results found.  Medications:   Scheduled Medications:  acetaminophen  650 mg Oral Q6H   amiodarone  400 mg Oral BID   apixaban  10 mg Oral BID   Followed by   Melene Muller ON 02/17/2023] apixaban  5 mg Oral BID   aspirin  81 mg Oral Daily   busPIRone  10 mg Oral BID   Chlorhexidine Gluconate Cloth  6 each Topical Daily   clopidogrel  75 mg Oral Daily   docusate sodium  100 mg Oral BID   feeding supplement  237 mL Oral BID BM   FLUoxetine  20 mg Oral QHS   gabapentin  300 mg Oral TID   insulin aspart  2-6 Units Subcutaneous TID WC   melatonin  3 mg Oral Daily   methocarbamol  500 mg Oral BID    polyethylene glycol  17 g Oral BID   rosuvastatin  20 mg Oral Daily   sacubitril-valsartan  1 tablet Oral BID   senna-docusate  1 tablet Oral BID   sodium chloride flush  10-40 mL Intracatheter Q12H   sodium chloride flush  3 mL Intravenous Q12H   sorbitol  30 mL Oral Once   spironolactone  25 mg Oral Daily    Infusions:  sodium chloride     sodium chloride Stopped (02/08/23 0416)   sodium chloride     sodium chloride     sodium chloride      PRN Medications: sodium chloride, sodium chloride, Place/Maintain arterial line **AND** sodium chloride, ALPRAZolam, alum & mag hydroxide-simeth, bisacodyl, calcium carbonate, hydrALAZINE, HYDROmorphone (DILAUDID) injection, hydrOXYzine, ipratropium-albuterol, lidocaine, lidocaine, nitroGLYCERIN, ondansetron (ZOFRAN) IV, mouth rinse, mouth rinse, mouth rinse, oxyCODONE, sodium chloride flush, sodium chloride flush  Patient Profile  67 y.o. female with history of CAD s/p PCI to RCA in 2021, carotid artery stenosis s/p R CEA, aortic valve stenosis. Admitted with unstable angina. Found to have severe LAD stenosis s/p PCI and severe low flow low gradient AS. Coarse c/b refractory VT and hemodynamic instability >> cardiogenic shock. Assessment/Plan  Acute systolic CHF >> cardiogenic shock - In setting of severe ischemic heart disease, reduced EF, severe AS and refractory VT/VF - Echo 01/25/23: EF 30-35%, septum and apex HK, RV low normal, low flow low graident severe AS with mean gradient 20 mmHg, AVA 0.93 cm2, DI 0.33 - echo 02/01/23: EF 20-25%, RV okay, severe low flow low gradient severe AS with mean gradient 25 mmHg, AVA 1.1 cm2 - s/p PCI/stent to LAD. Stent thrombosis on 08/09 treated with thrombectomy.  - IABP placed 08/13. Had considered [facility] ECMO but hemodynamics improved with inotrope support - S/p TAVR 8/15 - Echo 8/16 with EF 35-40%, mild LVH, WMAs, RV normal, bioprosthetic aortic valve s/p TAVR with mean gradient 13 mmH, trivial PVL, IVC  mildly dilated.  - IABP removed 8/17 - Stable off milrinone. Co-ox 60% - Volume mildly elevated. Start lasix 40 mg PO daily - Plan to start SGLT2i once foley removed (replaced 8/21 with urinary retention) - Continue spiro 25 mg daily  - Increase Entresto [phone] bid  - No BB for now with low output  2. Refractory VT - S/p multiple shocks.  - No further VT - Decrease po amio 400>200 mg BID  - Replace K/Mg as needed  3. CAD NSTEMI -HX PCI to RCA in 2021 -Presented with unstable angina. S/p PCI/stent to LAD on 08/07. Stent thrombosis 08/09 treated with thrombectomy -HS troponin > 24K on 08/12 -LHC 08/12 with patent LAD stent -LAD appeared widely patent on  TAVR angiogram -Continue DAPT and statin.    4. LFLG Severe AS -Noted on echo this admit -Not candidate for SAVR -s/p TAVR 8/15, valve stable on 8/15 echo with trivial PVL and low gradient.   5. Carotid artery stenosis -S/p R CEA in 2020  6. ID -Developed sepsis 08/13. Completed cefepime + vancomycin -Fever resolved.  -Enterobacter cloacae in trach aspirate.  -Now w/ suspected LUE cellulitis, restart abx   7. Respiratory failure - Extubated 8/18 - Down to 2L [place] - encourage incentive spirometer use  8. Hyponatremia -suspected hypervolemic hyponatremia -129 today - restrict free water  9. DVT left internal jugular - apixaban  Alen Bleacher, NP  9:19 AM  Patient seen with NP, agree with the above note.   She had a difficult night, scared being out of ICU.  Denies dyspnea, on 2L [place].  Very weak.   General: NAD Neck: JVP 8 cm, no thyromegaly or thyroid nodule.  Lungs: Clear to auscultation bilaterally with normal respiratory effort. CV: Nondisplaced PMI.  Heart regular S1/S2, no S3/S4, 1/6 SEM RUSB.  Trace ankle edema.  RUE swelling.  Abdomen: Soft, nontender, no hepatosplenomegaly, no distention.  Skin: Intact without lesions or rashes.  Neurologic: Alert and oriented x 3.  Psych: Normal  affect. Extremities: No clubbing or cyanosis.  HEENT: Normal.   Ischemic cardiomyopathy, mild volume overload and stable creatinine.  BP elevated.  - Lasix 40 mg po daily start today.  - Increase Entresto to 97/103 bid.   No arrythmias, can decrease amiodarone to 200 mg bid.   Remains on DAPT with ASA 81 and Plavix for LAD PCI. Also on apixaban for left internal jugular DVT.  Would continue triple therapy for now given history of stent thrombosis.   Should be ready for CIR soon, down to 2L oxygen.   Marca Ancona 02/15/2023 11:49 AM

## 2023-02-15 NOTE — Assessment & Plan Note (Signed)
Had right carotid endarterectomy in 2020.

## 2023-02-15 NOTE — Assessment & Plan Note (Signed)
Continue blood pressure control with entresto.  

## 2023-02-15 NOTE — Assessment & Plan Note (Signed)
SP TAVR with good toleration.

## 2023-02-15 NOTE — Assessment & Plan Note (Signed)
Continue with statin therapy.  ?

## 2023-02-15 NOTE — Assessment & Plan Note (Addendum)
Echocardiogram with reduced LV systolic function with EF 35 to 40%, mild LVH, RV systolic function preserved, RVSP 31,6 mmHg, aortic valve replaced. LV with hypokinetic mid and distal anterior wall, entire anterior septum, mid infero septal segment, apical inferior segment, and apex.    During this hospitalization patient cardiogenic shock, required intra aortic balloon pump, 08/13 to 08/17. Milrinone for inotropic support.  Currently on spironolactone, entresto and furosemide.  Holding on SGLT 2 inh due to risk of urinary infection (urinary retention with foley).   Refractive VT.  Sp multiple direct current cardioversion. Now on oral amiodarone.   Telemetry with no further VT.   Acute hypoxemic respiratory failure due to cardiogenic pulmonary edema.  Patient required invasive mechanical ventilation and diuresis.  Currently on 2 L/min per [place].

## 2023-02-15 NOTE — Progress Notes (Signed)
Inpatient Rehab Admissions Coordinator:    I have a CIR bed for this Pt. Today. RN may call report to [phone].  Pt. To admit to Cir for an estimated 14-16 days with the goal of reaching mod I level and returning home with intermittent support from family.   Megan Salon, MS, CCC-SLP Rehab Admissions Coordinator  [phone] (celll) [phone] (office)

## 2023-02-16 DIAGNOSIS — F419 Anxiety disorder, unspecified: Secondary | ICD-10-CM | POA: Diagnosis not present

## 2023-02-16 DIAGNOSIS — I5023 Acute on chronic systolic (congestive) heart failure: Secondary | ICD-10-CM | POA: Diagnosis not present

## 2023-02-16 DIAGNOSIS — I25110 Atherosclerotic heart disease of native coronary artery with unstable angina pectoris: Secondary | ICD-10-CM | POA: Diagnosis not present

## 2023-02-16 DIAGNOSIS — I350 Nonrheumatic aortic (valve) stenosis: Secondary | ICD-10-CM | POA: Diagnosis not present

## 2023-02-16 DIAGNOSIS — E871 Hypo-osmolality and hyponatremia: Secondary | ICD-10-CM

## 2023-02-16 LAB — GLUCOSE, CAPILLARY
Glucose-Capillary: 102 mg/dL — ABNORMAL HIGH (ref 70–99)
Glucose-Capillary: 106 mg/dL — ABNORMAL HIGH (ref 70–99)
Glucose-Capillary: 106 mg/dL — ABNORMAL HIGH (ref 70–99)
Glucose-Capillary: 107 mg/dL — ABNORMAL HIGH (ref 70–99)
Glucose-Capillary: 123 mg/dL — ABNORMAL HIGH (ref 70–99)
Glucose-Capillary: 130 mg/dL — ABNORMAL HIGH (ref 70–99)

## 2023-02-16 LAB — RENAL FUNCTION PANEL
Albumin: 2.4 g/dL — ABNORMAL LOW (ref 3.5–5.0)
Anion gap: 8 (ref 5–15)
BUN: 19 mg/dL (ref 8–23)
CO2: 33 mmol/L — ABNORMAL HIGH (ref 22–32)
Calcium: 8.9 mg/dL (ref 8.9–10.3)
Chloride: 91 mmol/L — ABNORMAL LOW (ref 98–111)
Creatinine, Ser: 0.75 mg/dL (ref 0.44–1.00)
GFR, Estimated: >60 mL/min (ref >60)
Glucose, Bld: 111 mg/dL — ABNORMAL HIGH (ref 70–99)
Phosphorus: 5 mg/dL — ABNORMAL HIGH (ref 2.5–4.6)
Potassium: 4.6 mmol/L (ref 3.5–5.1)
Sodium: 132 mmol/L — ABNORMAL LOW (ref 135–145)

## 2023-02-16 LAB — CBC
HCT: 25.3 % — ABNORMAL LOW (ref 36.0–46.0)
Hemoglobin: 8.3 g/dL — ABNORMAL LOW (ref 12.0–15.0)
MCH: 31.6 pg (ref 26.0–34.0)
MCHC: 32.8 g/dL (ref 30.0–36.0)
MCV: 96.2 fL (ref 80.0–100.0)
Platelets: 469 10*3/uL — ABNORMAL HIGH (ref 150–400)
RBC: 2.63 MIL/uL — ABNORMAL LOW (ref 3.87–5.11)
RDW: 13.8 % (ref 11.5–15.5)
WBC: 7.4 10*3/uL (ref 4.0–10.5)
nRBC: 0 % (ref 0.0–0.2)

## 2023-02-16 LAB — LACTIC ACID, PLASMA: Lactic Acid, Venous: 0.6 mmol/L (ref 0.5–1.9)

## 2023-02-16 MED ORDER — ALBUMIN HUMAN 25 % IV SOLN
25.0000 g | Freq: Once | INTRAVENOUS | Status: AC
  Administered 2023-02-16: 25 g via INTRAVENOUS
  Filled 2023-02-16: qty 100

## 2023-02-16 MED ORDER — SACUBITRIL-VALSARTAN 49-51 MG PO TABS
1.0000 | ORAL_TABLET | Freq: Two times a day (BID) | ORAL | Status: DC
  Administered 2023-02-16 – 2023-02-18 (×4): 1 via ORAL
  Filled 2023-02-16 (×5): qty 1

## 2023-02-16 MED ORDER — LIDOCAINE 5 % EX PTCH: 2.0000 | MEDICATED_PATCH | Freq: Every day | CUTANEOUS | Status: DC | PRN

## 2023-02-16 MED ORDER — ALPRAZOLAM 0.5 MG PO TABS
3.0000 mg | ORAL_TABLET | Freq: Two times a day (BID) | ORAL | Status: DC
  Administered 2023-02-16 – 2023-02-18 (×4): 3 mg via ORAL
  Filled 2023-02-16: qty 6
  Filled 2023-02-16: qty 12
  Filled 2023-02-16 (×2): qty 6

## 2023-02-16 MED ORDER — ALPRAZOLAM 0.5 MG PO TABS
3.0000 mg | ORAL_TABLET | Freq: Every day | ORAL | Status: DC | PRN
  Administered 2023-02-16: 3 mg via ORAL
  Filled 2023-02-16: qty 6

## 2023-02-16 NOTE — Progress Notes (Signed)
BP has been trending down, now 89/44 (MAP 56). She is asymptomatic. Plan to give albumin and add CBC and lactate to morning labs.

## 2023-02-16 NOTE — Plan of Care (Signed)
  Problem: Education: Goal: Knowledge of General Education information will improve Description: Including pain rating scale, medication(s)/side effects and non-pharmacologic comfort measures Outcome: Progressing   Problem: Health Behavior/Discharge Planning: Goal: Ability to manage health-related needs will improve Outcome: Progressing   Problem: Clinical Measurements: Goal: Will remain free from infection Outcome: Progressing Goal: Diagnostic test results will improve Outcome: Progressing   Problem: Activity: Goal: Risk for activity intolerance will decrease Outcome: Progressing   Problem: Nutrition: Goal: Adequate nutrition will be maintained Outcome: Progressing   Problem: Elimination: Goal: Will not experience complications related to bowel motility Outcome: Progressing Goal: Will not experience complications related to urinary retention Outcome: Progressing   Problem: Pain Managment: Goal: General experience of comfort will improve Outcome: Progressing   Problem: Safety: Goal: Ability to remain free from injury will improve Outcome: Progressing   Problem: Skin Integrity: Goal: Risk for impaired skin integrity will decrease Outcome: Progressing   Problem: Clinical Measurements: Goal: Ability to maintain clinical measurements within normal limits will improve Outcome: Not Progressing Goal: Respiratory complications will improve Outcome: Not Progressing Goal: Cardiovascular complication will be avoided Outcome: Not Progressing   Problem: Coping: Goal: Level of anxiety will decrease Outcome: Not Progressing

## 2023-02-16 NOTE — Progress Notes (Signed)
Inpatient Rehabilitation Admissions Coordinator   I met with patient at bedside. Noted hypotensive overnight requiring albumin. Heart failure team felt likely over diuresis. Holding lasix and checking orthostatics. Reassurance given to patient concerning eventual plan to admit to CIR. We will follow up tomorrow.  Ottie Glazier, RN, MSN Rehab Admissions Coordinator [phone] 02/16/2023 10:30 AM

## 2023-02-16 NOTE — Progress Notes (Signed)
PROGRESS NOTE    Madison West  NWG:[id number] DOB: [birth year] DOA: 01/23/2023 PCP: [name], MD  67/F w history of PSVT, anxiety, breast cancer sp lumpectomy/ radiation 2009, hypertension, hyperlipidemia, coronary artery disease, carotid artery disease, and mild to moderate aortic stenosis presented with dyspnea, weight gain and generalized weakness. Patient was placed on IV diuretic therapy.  -Echo noted EF 30-35% with severe aortic stenosis.  -8/07 cardiac catheterization with mid LAD 95% lesion, and underwent stent placement.  CT surgery consulted for severe aortic stenosis.  -8/11 patient on floor and had pulseless VT. CPR initiated, given Epi, and shocked x 2 with ROSC. Patient alert after CPR event but slightly confused. -Followed by recurrent VT was shocked. Given amio, mag, calcium. started on amio drip, transferred to [facility] ICU and cardiology consulted. EKG showing ST elevation in anterior leads. Code STEMI.    Required IABP placement 8/13.  8/15 -underwent TAVR   8/16 Doing better, weaning pressors, intubated, IABP in place 8/17 IABP removed 8/18 Extubated 8/19 Swan removed, Lasix gtt stopped. PICC placed. 8/20 MBS. Weaning milrinone. Started spiro and losartan. Completed Abx for PNS  8/21 Foley replaced, appreciate Urology NP. L internal jugular DVT and L cephalic SVT    8/22 milrinone wean to 0.125.  8/23: milrinone stopped 8/24 stable off all vasoactives. O2 down to 4Lpm   -8/26 transferred to [facility]. Pending transfer to CIR.  -8/27 last night had hypotension, delaying transfer to CIR.     Subjective:   Assessment and Plan:  Acute on chronic systolic CHF Cardiogenic shock -from ischemic CM and severe AS w/ recurrent VT -initial Echo 01/25/23: EF 30-35%, septum and apex HK,severe AS  - s/p PCI/stent to LAD 8/7. Stent thrombosis on 8/09 treated with thrombectomy.  - IABP placed 08/13.  - S/p TAVR 8/15 - Echo 8/16 with EF 35-40%, mild LVH, WMAs, RV normal,  bioprosthetic aortic valve s/p TAVR with mean gradient 13 mmH, trivial PVL, IVC mildly dilated.  - off balloon pump 8/17, taken off milrinone - CHF team following, BP soft - Continue spiro, entresto -  DC planing   Refractive VT.  Sp multiple direct current cardioversion. Now on oral amiodarone.   Monitor electrolytes  Acute hypoxemic respiratory failure due to cardiogenic pulmonary edema.  -extubated 8/18  CAD NSTEMI.  LAD sent placed on 08/07 and developed stent thrombosis 08/09 treated with thrombectomy.  08/12 coronary angiography with patent stent.  -continue DAPT, statin  Nonrheumatic aortic valve stenosis SP TAVR with good toleration.   Pneumonia -completed Abx therapy  Anxiety Resume alprazolam.  Continue with buspirone and hydroxyzine.   Hypertension Continue blood pressure control with entresto.  Transient hypotension likely due to over diuresis.   Bilateral carotid artery stenosis Had right carotid endarterectomy in 2020.    Sepsis due to pneumonia [facility]) Not present on admission.  Enterobacter cloacae in trach aspirate.  Sepsis has resolved,.  Patient has completed antibiotic therapy with vancomycin and cefepime.   DVT (deep venous thrombosis) (HCC) Left internal jugular vein  Continue with apixaban.   Anemia of chronic disease, stable hgb at 8.3  Thrombocytosis 469, possible reactive.   Hyponatremia Renal function stable with serum cr at 0,72 with K at 4,6 and serum bicarbonate at 33.  Na 132. P 5.0   DVT prophylaxis: apixaban Code Status: Full code Family Communication: Disposition Plan:   Consultants:    Procedures:   Antimicrobials:    Objective: Vitals:   02/16/23 0900 02/16/23 1118 02/16/23 1120 02/16/23 1130  BP:  (!) 161/111 (!) 175/99 (!) 157/75  Pulse:   85   Resp:   19   Temp:   97.9 F (36.6 C)   TempSrc:   Oral   SpO2:      Weight: 100.4 kg     Height:        Intake/Output Summary (Last 24 hours) at 02/16/2023  1355 Last data filed at 02/16/2023 1348 Gross per 24 hour  Intake --  Output 4750 ml  Net -4750 ml   Filed Weights   02/15/23 0500 02/16/23 0300 02/16/23 0900  Weight: 102.4 kg 102.2 kg 100.4 kg    Examination:  General exam: Appears calm and comfortable  Respiratory system: Clear to auscultation Cardiovascular system: S1 & S2 heard, RRR.  Abd: nondistended, soft and nontender.Normal bowel sounds heard. Central nervous system: Alert and oriented. No focal neurological deficits. Extremities: no edema Skin: No rashes Psychiatry:  Mood & affect appropriate.     Data Reviewed:   CBC: Recent Labs  Lab 02/10/23 0449 02/11/23 0508 02/14/23 0346 02/16/23 0215  WBC 9.2 8.4 8.1 7.4  NEUTROABS  --   --  5.1  --   HGB 8.6* 8.7* 9.1* 8.3*  HCT 26.5* 25.9* 28.2* 25.3*  MCV 96.4 96.3 96.2 96.2  PLT 325 331 424* 469*   Basic Metabolic Panel: Recent Labs  Lab 02/10/23 0449 02/11/23 0508 02/12/23 0440 02/12/23 1725 02/13/23 0531 02/14/23 0346 02/15/23 1036 02/16/23 0215  NA 134*   < > 127* 128* 128* 129* 130* 132*  K 3.9   < > 4.4 4.4 4.4 4.1 4.9 4.6  CL 96*   < > 91* 90* 89* 90* 91* 91*  CO2 30   < > 29 30 31  33* 32 33*  GLUCOSE 152*   < > 120* 128* 116* 101* 127* 111*  BUN 22   < > 22 21 21 21 15 19   CREATININE 1.02*   < > 0.97 0.83 1.05* 0.83 0.72 0.75  CALCIUM 9.0   < > 8.4* 8.5* 8.4* 8.7* 9.0 8.9  MG 2.2  --   --   --   --   --   --   --   PHOS 4.6   < > 3.5  --  3.7 4.4 4.1 5.0*   < > = values in this interval not displayed.   GFR: Estimated Creatinine Clearance: 82.2 mL/min (by C-G formula based on SCr of 0.75 mg/dL). Liver Function Tests: Recent Labs  Lab 02/12/23 0440 02/13/23 0531 02/14/23 0346 02/15/23 1036 02/16/23 0215  ALBUMIN 2.2* 2.4* 2.4* 2.6* 2.4*   No results for input(s): "LIPASE", "AMYLASE" in the last 168 hours. No results for input(s): "AMMONIA" in the last 168 hours. Coagulation Profile: No results for input(s): "INR", "PROTIME" in  the last 168 hours. Cardiac Enzymes: No results for input(s): "CKTOTAL", "CKMB", "CKMBINDEX", "TROPONINI" in the last 168 hours. BNP (last 3 results) No results for input(s): "PROBNP" in the last 8760 hours. HbA1C: No results for input(s): "HGBA1C" in the last 72 hours. CBG: Recent Labs  Lab 02/15/23 2042 02/16/23 0016 02/16/23 0411 02/16/23 1002 02/16/23 1137  GLUCAP 113* 123* 130* 106* 102*   Lipid Profile: No results for input(s): "CHOL", "HDL", "LDLCALC", "TRIG", "CHOLHDL", "LDLDIRECT" in the last 72 hours. Thyroid Function Tests: No results for input(s): "TSH", "T4TOTAL", "FREET4", "T3FREE", "THYROIDAB" in the last 72 hours. Anemia Panel: No results for input(s): "VITAMINB12", "FOLATE", "FERRITIN", "TIBC", "IRON", "RETICCTPCT" in the last 72 hours. Urine analysis:  Component Value Date/Time   COLORURINE STRAW (A) 06/06/2019 1159   APPEARANCEUR CLEAR 06/06/2019 1159   LABSPEC 1.005 06/06/2019 1159   PHURINE 7.0 06/06/2019 1159   GLUCOSEU NEGATIVE 06/06/2019 1159   HGBUR SMALL (A) 06/06/2019 1159   BILIRUBINUR NEGATIVE 06/06/2019 1159   BILIRUBINUR neg 10/04/2013 2039   KETONESUR NEGATIVE 06/06/2019 1159   PROTEINUR NEGATIVE 06/06/2019 1159   UROBILINOGEN 0.2 10/04/2013 2039   NITRITE NEGATIVE 06/06/2019 1159   LEUKOCYTESUR NEGATIVE 06/06/2019 1159   Sepsis Labs: @LABRCNTIP (procalcitonin:4,lacticidven:4)  )No results found for this or any previous visit (from the past 240 hour(s)).   Radiology Studies: No results found.   Scheduled Meds:  acetaminophen  650 mg Oral Q6H   ALPRAZolam  3 mg Oral BID   amiodarone  200 mg Oral BID   apixaban  10 mg Oral BID   Followed by   Melene Muller ON 02/17/2023] apixaban  5 mg Oral BID   aspirin  81 mg Oral Daily   busPIRone  10 mg Oral BID   Chlorhexidine Gluconate Cloth  6 each Topical Daily   clopidogrel  75 mg Oral Daily   docusate sodium  100 mg Oral BID   feeding supplement  237 mL Oral BID BM   FLUoxetine  20 mg Oral  QHS   gabapentin  300 mg Oral TID   insulin aspart  2-6 Units Subcutaneous TID WC   melatonin  3 mg Oral Daily   methocarbamol  500 mg Oral BID   polyethylene glycol  17 g Oral BID   rosuvastatin  20 mg Oral Daily   sacubitril-valsartan  1 tablet Oral BID   senna-docusate  1 tablet Oral BID   sodium chloride flush  10-40 mL Intracatheter Q12H   sodium chloride flush  3 mL Intravenous Q12H   sorbitol  30 mL Oral Once   spironolactone  25 mg Oral Daily   Continuous Infusions:  sodium chloride     sodium chloride Stopped (02/08/23 0416)   sodium chloride     sodium chloride     sodium chloride       LOS: 23 days    Time spent:    Zannie Cove, MD [facility]   02/16/2023, 1:55 PM

## 2023-02-16 NOTE — Assessment & Plan Note (Addendum)
Resume alprazolam.  Continue with buspirone and hydroxyzine.

## 2023-02-16 NOTE — Progress Notes (Addendum)
Progress Note   Patient: Madison West XBM:[id number] DOB: 01-27-1956 DOA: 01/23/2023     23 DOS: the patient was seen and examined on 02/16/2023   Brief hospital course: Madison West was admitted to the hospital with the working diagnosis of heart failure decompensation in the setting of aortic stenosis. Hospitalization complicated cardiogenic shock and acute coronary syndrome.   67 yo female with the past medical history of PSVT, anxiety, breast cancer sp lumpectomy/ radiation 2009, hypertension, hyperlipidemia, coronary artery disease, carotid artery disease, and mild to moderate aortic stenosis who presented with dyspnea, weight gain and generalized weakness. She has been not compliant with her medical therapy. On her initial physical examination her blood pressure was 132/88, HR 87, RR 16 and 02 saturation 96%, lungs rhonchi and wheezing, heart with S1 and S2 present and regular, positive systolic murmur at the base, abdomen with no distention and positive lower extremity edema.   Patient was placed on IV diuretic therapy.   Echocardiogram with reduced LV systolic function, 30 to 35% with severe aortic stenosis.  08/07 cardiac catheterization with mid LAD 95% lesion, and underwent stent placement.  CT surgery consulted for severe aortic stenosis.   08/11 patient on floor and had pulseless VT. CPR initiated, given Epi, and shocked x 2 with ROSC. Patient alert after CPR event but slightly confused. Patient again went into VT and was shocked. Given amio, mag, calcium. Patient started on amio drip. Patient transferred to [facility] ICU and cardiology consulted. EKG showing ST elevation in anterior leads. Code STEMI.    Required IABP placement 8/13. Ultimately underwent TAVR 8/15.  8/16 Doing better, weaning pressors, remains intubated, IABP in place 8/17 IABP removed 8/18 Extubated 8/19 Swan removed, Lasix gtt stopped. PICC placed. 8/20 MBS. Weaning milrinone. Started spiro and losartan.  8/21  Foley replaced, appreciate Uro NP's help. L internal jugular DVT and L cephalic SVT    8/22 milrinone wean to 0.125.  8/24 off all vasoactives. O2 down to 4Lpm  08/26 transferred to [facility]. Pending transfer to CIR.  08/27 last night had hypotension, delaying transfer to CIR.    Assessment and Plan: * Acute on chronic systolic CHF (congestive heart failure) (HCC) Echocardiogram with reduced LV systolic function with EF 35 to 40%, mild LVH, RV systolic function preserved, RVSP 31,6 mmHg, aortic valve replaced. LV with hypokinetic mid and distal anterior wall, entire anterior septum, mid infero septal segment, apical inferior segment, and apex.    During this hospitalization patient cardiogenic shock, required intra aortic balloon pump, 08/13 to 08/17. Milrinone for inotropic support.  Currently on spironolactone and entresto. Holding loop diuretic due to hypotension.   Holding on SGLT 2 inh due to risk of urinary infection (urinary retention with foley). Will do a voiding trial today.   Refractive VT.  Sp multiple direct current cardioversion. Now on oral amiodarone.   Telemetry with no further VT.   Acute hypoxemic respiratory failure due to cardiogenic pulmonary edema.  Patient required invasive mechanical ventilation and diuresis.  Currently on 2 L/min per [place].   Coronary artery disease involving native coronary artery of native heart with unstable angina pectoris (HCC) NSTEMI.  LAD sent placed on 08/07 and developed stent thrombosis 08/09 treated with thrombectomy.  08/12 coronary angiography with patent stent.   On dual antiplatelet therapy and statin.   Nonrheumatic aortic valve stenosis SP TAVR with good toleration.   Pure hypercholesterolemia Continue with statin therapy.   Anxiety Resume alprazolam.  Continue with buspirone and hydroxyzine.  Hypertension Continue blood pressure control with entresto.  Transient hypotension likely due to over diuresis.   Bilateral  carotid artery stenosis Had right carotid endarterectomy in 2020.    Sepsis due to pneumonia [facility]) Not present on admission.  Enterobacter cloacae in trach aspirate.  Sepsis has resolved,.  Patient has completed antibiotic therapy with vancomycin and cefepime.   DVT (deep venous thrombosis) (HCC) Left internal jugular vein  Continue with apixaban.   Anemia of chronic disease, stable hgb at 8.3  Thrombocytosis 469, possible reactive.   Hyponatremia Renal function stable with serum cr at 0,72 with K at 4,6 and serum bicarbonate at 33.  Na 132. P 5.0   Plan to continue close follow up renal function and electrolytes.         Subjective: Patient had hypotension last night with no chest pain, this am blood pressure has improved.   Physical Exam: Vitals:   02/16/23 0900 02/16/23 1118 02/16/23 1120 02/16/23 1130  BP:  (!) 161/111 (!) 175/99 (!) 157/75  Pulse:   85   Resp:   19   Temp:   97.9 F (36.6 C)   TempSrc:   Oral   SpO2:      Weight: 100.4 kg     Height:       Neurology awake and alert ENT with mild pallor Cardiovascular with S1 and S2 present, positive systolic murmur at the base, with no gallops No JVD No lower extremity edema Respiratory with no rales or wheezing, no rhonchi Abdomen with no distention  Data Reviewed:    Family Communication: no family at the bedside   Disposition: Status is: Inpatient Remains inpatient appropriate because: pending transfer to CIR, possible tomorrow if blood pressure stable.   Planned Discharge Destination: Rehab      Author: Coralie Keens, MD 02/16/2023 1:09 PM  For on call review [email or web address].

## 2023-02-16 NOTE — Progress Notes (Signed)
Nutrition Follow-up  DOCUMENTATION CODES:   Not applicable  INTERVENTION:  Liberalize diet to regular to promote adequate PO intake Continue Ensure Enlive po BID, each supplement provides 350 kcal and 20 grams of protein. Trial Carnation Breakfast Essentials TID, each packet mixed with 8 ounces of 2% milk provides 13 grams of protein and 260 calories. Snacks TID between meals   NUTRITION DIAGNOSIS:   Inadequate oral intake related to acute illness as evidenced by NPO status. - addressing via meals, snacks and nutrition supplements  GOAL:   Patient will meet greater than or equal to 90% of their needs - goal unmet  MONITOR:   TF tolerance, Vent status, Labs, Weight trends  REASON FOR ASSESSMENT:   Ventilator    ASSESSMENT:   67 yo female admitted progressively worsening SOB and fatigue with weight gain and admitted with acute systolic failure. Pt with CODE STEMI post Vtach arrest, developed refractory VT requiring IABP, intubation. PMH includes HTN, HLD, CAD s/p stenting, moderate aprtic stenosis  8/04 Admitted 8/11 V-tach arrest, CPR initiated, Transferred to ICU, Code STEMI 8/13 Developed refractory VT, shocked multiple times, IABP. Considered for [facility] ECMO but decision made not to cannulate 8/14 trickle TF initiated 8/15 TAVR 8/16 Trickle TF initiated and titration towards goal 8/17 IABP removed 8/18 Extubated 8/20 Diet advanced to Heart Healthy  Remains admitted d/t episode of hypotension over night. Per Cardiology, like d/t over diuresis. Plans to d/c to CIR, likely tomorrow.   Pt sitting up in chair attempting to eat lunch at time of visit. She states that her appetite was better when she was on a "room service" unit. Now that her options are more limited and she is unable to order foods such as salmon, her appetite is diminished. She states that she did not eat dinner last night as she did not have the appetite or enjoy the options. Portions and meal intake remain  limited.  Pt would benefit from liberalized diet at this time to reduce dietary restrictions given increased need for nutrients and protein to support healing and optimal energy for rehab.   Pt does not particularly enjoy milk products but continues to try to consume at least 1 ensure daily as long as she receives chocolate flavor. Also agreeable to try carnation instant breakfast to determine preferences.   Meal completions: 8/22: 75% lunch, 50% dinner 8/24: 75% breakfast 8/25: 50% breakfast  Medications: colace, SSI 2-6 units TID, melatonin, miralax, senna   Labs: sodium 132, Phos 5.0, CBG's 102-130 x24 hours  UOP: 3950 ml x24 hours + x12 hours I/O's: -17.6L since 8/13  Diet Order:   Diet Order             Diet Heart Room service appropriate? Yes with Assist; Fluid consistency: Thin; Fluid restriction: 1200 mL Fluid  Diet effective now                   EDUCATION NEEDS:   Not appropriate for education at this time  Skin:  Skin Assessment: Reviewed RN Assessment  Last BM:  8/27 (type 4 medium)  Height:   Ht Readings from Last 1 Encounters:  01/24/23 5\' 8"  (1.727 m)    Weight:   Wt Readings from Last 1 Encounters:  02/16/23 100.4 kg    BMI:  Body mass index is 33.65 kg/m.  Estimated Nutritional Needs:   Kcal:  1800-2000 kcals  Protein:  90-105 g  Fluid:  1.8 L  Drusilla Kanner, RDN, LDN Clinical Nutrition

## 2023-02-16 NOTE — Progress Notes (Signed)
Pt declined walk, pt looking forward to getting stronger in OPCR.   Madison West 02/16/2023 2:38 PM

## 2023-02-16 NOTE — Progress Notes (Signed)
Mobility Specialist Progress Note:    02/16/23 1200  Mobility  Activity Transferred to/from [facility]  Level of Assistance Contact guard assist, steadying assist  Assistive Device Other (Comment) (HHA)  Distance Ambulated (ft) 5 ft  Activity Response Tolerated well  Mobility Referral Yes  $Mobility charge 1 Mobility  Mobility Specialist Start Time (ACUTE ONLY) 1113  Mobility Specialist Stop Time (ACUTE ONLY) 1127  Mobility Specialist Time Calculation (min) (ACUTE ONLY) 14 min   Pt received in [facility], w/ NT in room requesting to get in the recliner. Pt needed minA to stand and ambulate to chair. Pt was able to take a couple side steps w/ HHA w/o fault. No c/o throughout session. Left in chair w/ NT in room. All needs met and chair alarm on.    Thompson Grayer Mobility Specialist  Please contact vis Secure Chat or  Rehab Office [phone]

## 2023-02-16 NOTE — Progress Notes (Addendum)
Patient ID: Madison West, female   DOB: Dec 26, 1955, 67 y.o.   MRN: [id number]     Advanced Heart Failure Rounding Note  PCP-Cardiologist: None   Subjective:   - 8/15 s/p TAVR. Coronary angiogram w/ widely patent stent. Post op echo w/ no paravalvular leak. - Echo (8/16): EF 35-40%, mild LVH, WMAs, RV normal, bioprosthetic aortic valve s/p TAVR with mean gradient 13 mmH, trivial PVL, IVC mildly dilated.  - IABP removed 8/17 - extubated 8/18 -8/20 Completed abx for PNA -8/23 Milrinone stopped.   Yesterday amio decreased to 200 mg twice a day, po lasix added,  and entresto increased to 97-103 twice a day. Overnight hypotensive. Given  25 gram albumin. Lactic acid 0.6   Brisk diuresis over the last 24 hours. Negative 3.6 liters. Weight was in the bed.   Anxious about going to rehab. Had some coughing overnight. A little dizzy when standing.   Objective:   Weight Range: 102.2 kg Body mass index is 34.27 kg/m.   Vital Signs:   Temp:  [96.9 F (36.1 C)-98.2 F (36.8 C)] 98 F (36.7 C) (08/27 0700) Pulse Rate:  [66-81] 79 (08/27 0700) Resp:  [16-18] 18 (08/27 0700) BP: (81-137)/(45-63) 122/59 (08/27 0700) SpO2:  [92 %-95 %] 93 % (08/27 0700) Weight:  [102.2 kg] 102.2 kg (08/27 0300) Last BM Date : 02/15/23  Weight change: Filed Weights   02/15/23 0400 02/15/23 0500 02/16/23 0300  Weight: 101.1 kg 102.4 kg 102.2 kg    Intake/Output:   Intake/Output Summary (Last 24 hours) at 02/16/2023 0851 Last data filed at 02/16/2023 0328 Gross per 24 hour  Intake 300 ml  Output 3950 ml  Net -3650 ml    Physical Exam  General:  No resp difficulty HEENT: normal Neck: supple. no JVD. Carotids 2+ bilat; no bruits. No lymphadenopathy or thryomegaly appreciated. Cor: PMI nondisplaced. Regular rate & rhythm. No rubs, gallops or murmurs. Lungs: Coarse throughout on 2 liters  Abdomen: soft, nontender, nondistended. No hepatosplenomegaly. No bruits or masses. Good bowel  sounds. Extremities: no cyanosis, clubbing, rash, edema. RUE PICC  Neuro: alert & orientedx3, cranial nerves grossly intact. moves all 4 extremities w/o difficulty. Affect pleasant  Telemetry   SR 60-80s   Labs    CBC Recent Labs    02/14/23 0346 02/16/23 0215  WBC 8.1 7.4  NEUTROABS 5.1  --   HGB 9.1* 8.3*  HCT 28.2* 25.3*  MCV 96.2 96.2  PLT 424* 469*   Basic Metabolic Panel Recent Labs    78/29/56 1036 02/16/23 0215  NA 130* 132*  K 4.9 4.6  CL 91* 91*  CO2 32 33*  GLUCOSE 127* 111*  BUN 15 19  CREATININE 0.72 0.75  CALCIUM 9.0 8.9  PHOS 4.1 5.0*   Liver Function Tests Recent Labs    02/15/23 1036 02/16/23 0215  ALBUMIN 2.6* 2.4*    No results for input(s): "LIPASE", "AMYLASE" in the last 72 hours. Cardiac Enzymes No results for input(s): "CKTOTAL", "CKMB", "CKMBINDEX", "TROPONINI" in the last 72 hours.  BNP: BNP (last 3 results) Recent Labs    01/23/23 2208 02/14/23 0346  BNP 462.3* 418.7*   ProBNP (last 3 results) No results for input(s): "PROBNP" in the last 8760 hours.  D-Dimer No results for input(s): "DDIMER" in the last 72 hours. Hemoglobin A1C No results for input(s): "HGBA1C" in the last 72 hours.  Fasting Lipid Panel No results for input(s): "CHOL", "HDL", "LDLCALC", "TRIG", "CHOLHDL", "LDLDIRECT" in the last 72 hours.  Thyroid  Function Tests No results for input(s): "TSH", "T4TOTAL", "T3FREE", "THYROIDAB" in the last 72 hours.  Invalid input(s): "FREET3"  Other results:  Imaging    No results found.   Medications:   Scheduled Medications:  acetaminophen  650 mg Oral Q6H   amiodarone  200 mg Oral BID   apixaban  10 mg Oral BID   Followed by   Melene Muller ON 02/17/2023] apixaban  5 mg Oral BID   aspirin  81 mg Oral Daily   busPIRone  10 mg Oral BID   Chlorhexidine Gluconate Cloth  6 each Topical Daily   clopidogrel  75 mg Oral Daily   docusate sodium  100 mg Oral BID   feeding supplement  237 mL Oral BID BM    FLUoxetine  20 mg Oral QHS   furosemide  40 mg Oral Daily   gabapentin  300 mg Oral TID   insulin aspart  2-6 Units Subcutaneous TID WC   melatonin  3 mg Oral Daily   methocarbamol  500 mg Oral BID   polyethylene glycol  17 g Oral BID   rosuvastatin  20 mg Oral Daily   sacubitril-valsartan  1 tablet Oral BID   senna-docusate  1 tablet Oral BID   sodium chloride flush  10-40 mL Intracatheter Q12H   sodium chloride flush  3 mL Intravenous Q12H   sorbitol  30 mL Oral Once   spironolactone  25 mg Oral Daily    Infusions:  sodium chloride     sodium chloride Stopped (02/08/23 0416)   sodium chloride     sodium chloride     sodium chloride      PRN Medications: sodium chloride, sodium chloride, Place/Maintain arterial line **AND** sodium chloride, ALPRAZolam, alum & mag hydroxide-simeth, bisacodyl, calcium carbonate, hydrALAZINE, HYDROmorphone (DILAUDID) injection, hydrOXYzine, ipratropium-albuterol, lidocaine, lidocaine, nitroGLYCERIN, ondansetron (ZOFRAN) IV, mouth rinse, mouth rinse, mouth rinse, oxyCODONE, sodium chloride flush, sodium chloride flush  Patient Profile  67 y.o. female with history of CAD s/p PCI to RCA in 2021, carotid artery stenosis s/p R CEA, aortic valve stenosis. Admitted with unstable angina. Found to have severe LAD stenosis s/p PCI and severe low flow low gradient AS. Coarse c/b refractory VT and hemodynamic instability >> cardiogenic shock. Assessment/Plan  Acute systolic CHF >> cardiogenic shock - In setting of severe ischemic heart disease, reduced EF, severe AS and refractory VT/VF - Echo 01/25/23: EF 30-35%, septum and apex HK, RV low normal, low flow low graident severe AS with mean gradient 20 mmHg, AVA 0.93 cm2, DI 0.33 - echo 02/01/23: EF 20-25%, RV okay, severe low flow low gradient severe AS with mean gradient 25 mmHg, AVA 1.1 cm2 - s/p PCI/stent to LAD. Stent thrombosis on 08/09 treated with thrombectomy.  - IABP placed 08/13. Had considered VA ECMO  but hemodynamics improved with inotrope support - S/p TAVR 8/15 - Echo 8/16 with EF 35-40%, mild LVH, WMAs, RV normal, bioprosthetic aortic valve s/p TAVR with mean gradient 13 mmH, trivial PVL, IVC mildly dilated.  - IABP removed 8/17.  Stable off milrinone.  - Hypotensive overnight and given albumin. Lactic acid was not elevated. . Suspect due to  over diuresis and higher dose of entresto.  - Volume status looks a little low. Hold lasix today. Check orthostatics.  - Plan to start SGLT2i once foley removed (replaced 8/21 with urinary retention) - Continue spiro 25 mg daily  -  Cut back entresto 49-51 mg twice a day.  - No BB for now with  low output  2. Refractory VT - S/p multiple shocks.  - No further VT - continue amio 200 mg twice a day.  K stable.   3. CAD NSTEMI -HX PCI to RCA in 2021 -Presented with unstable angina. S/p PCI/stent to LAD on 08/07. Stent thrombosis 08/09 treated with thrombectomy -HS troponin > 24K on 08/12 -LHC 08/12 with patent LAD stent -LAD appeared widely patent on TAVR angiogram - No chest pain.  -Continue DAPT and statin.    4. LFLG Severe AS -Noted on echo this admit -Not candidate for SAVR -s/p TAVR 8/15, valve stable on 8/15 echo with trivial PVL and low gradient.   5. Carotid artery stenosis -S/p R CEA in 2020  6. ID -Developed sepsis 08/13. PNA Completed antibiotics.  - WBC 7.4   7. Respiratory failure - Extubated 8/18.  - Completed antibiotic course for pneumonia.  - Sats stable on 2 liters [place].  - Needs ongoing pulmonary toilet.   8. Hyponatremia -suspected hypervolemic hyponatremia -132 today - restrict free water  9. DVT left internal jugular - Continue  eliquis 10 mg twice a day.   10. Anemia  -Hgb 9.1>8.3  -No obvious source.     Tonye Becket, NP  8:51 AM  Patient seen with NP, agree with the above note.   Vigorous diuresis yesterday, weight down 4 lbs.  Creatinine stable.  SBP to 90s overnight, she was given  albumen.    She feels "tired" today, SBP back to 150s.   General: NAD Neck: No JVD, no thyromegaly or thyroid nodule.  Lungs: Clear to auscultation bilaterally with normal respiratory effort. CV: Nondisplaced PMI.  Heart regular S1/S2, no S3/S4, 2/6 SEM RUSB.  No peripheral edema.   Abdomen: Soft, nontender, no hepatosplenomegaly, no distention.  Skin: Intact without lesions or rashes.  Neurologic: Alert and oriented x 3.  Psych: Normal affect. Extremities: No clubbing or cyanosis.  HEENT: Normal.   Ischemic cardiomyopathy, volume status looks ok and hypotensive last night.  - Hold Lasix today, can try lower dose 20 mg daily in the future vs Farxiga once foley is out.   - Decrease Entresto to 49/51 bid. - Continue spironolactone.     No arrythmias, continue amiodarone 200 mg bid for now.    Remains on DAPT with ASA 81 and Plavix for LAD PCI. Also on apixaban for left internal jugular DVT.  Would continue triple therapy for now given history of stent thrombosis.    Should be ready for CIR soon (tomorrow), down to 2L oxygen.   Marca Ancona 02/16/2023 12:01 PM

## 2023-02-17 ENCOUNTER — Inpatient Hospital Stay (HOSPITAL_COMMUNITY): Payer: Medicare Other

## 2023-02-17 DIAGNOSIS — M7989 Other specified soft tissue disorders: Secondary | ICD-10-CM | POA: Diagnosis not present

## 2023-02-17 DIAGNOSIS — I5023 Acute on chronic systolic (congestive) heart failure: Secondary | ICD-10-CM | POA: Diagnosis not present

## 2023-02-17 LAB — GLUCOSE, CAPILLARY
Glucose-Capillary: 106 mg/dL — ABNORMAL HIGH (ref 70–99)
Glucose-Capillary: 104 mg/dL — ABNORMAL HIGH (ref 70–99)
Glucose-Capillary: 110 mg/dL — ABNORMAL HIGH (ref 70–99)

## 2023-02-17 LAB — CBC
HCT: 26.4 % — ABNORMAL LOW (ref 36.0–46.0)
Hemoglobin: 8.6 g/dL — ABNORMAL LOW (ref 12.0–15.0)
MCH: 31.4 pg (ref 26.0–34.0)
MCHC: 32.6 g/dL (ref 30.0–36.0)
MCV: 96.4 fL (ref 80.0–100.0)
Platelets: 495 10*3/uL — ABNORMAL HIGH (ref 150–400)
RBC: 2.74 MIL/uL — ABNORMAL LOW (ref 3.87–5.11)
RDW: 14.1 % (ref 11.5–15.5)
WBC: 7 10*3/uL (ref 4.0–10.5)
nRBC: 0 % (ref 0.0–0.2)

## 2023-02-17 LAB — RENAL FUNCTION PANEL
Albumin: 2.7 g/dL — ABNORMAL LOW (ref 3.5–5.0)
Anion gap: 9 (ref 5–15)
BUN: 18 mg/dL (ref 8–23)
CO2: 29 mmol/L (ref 22–32)
Calcium: 9 mg/dL (ref 8.9–10.3)
Chloride: 95 mmol/L — ABNORMAL LOW (ref 98–111)
Creatinine, Ser: 0.78 mg/dL (ref 0.44–1.00)
GFR, Estimated: >60 mL/min (ref >60)
Glucose, Bld: 115 mg/dL — ABNORMAL HIGH (ref 70–99)
Phosphorus: 4.6 mg/dL (ref 2.5–4.6)
Potassium: 4.1 mmol/L (ref 3.5–5.1)
Sodium: 133 mmol/L — ABNORMAL LOW (ref 135–145)

## 2023-02-17 MED ORDER — LIDOCAINE 5 % EX PTCH
1.0000 | MEDICATED_PATCH | CUTANEOUS | Status: DC
  Administered 2023-02-17: 1 via TRANSDERMAL
  Filled 2023-02-17: qty 1

## 2023-02-17 MED ORDER — DAPAGLIFLOZIN PROPANEDIOL 10 MG PO TABS
10.0000 mg | ORAL_TABLET | Freq: Every day | ORAL | Status: DC
  Administered 2023-02-17 – 2023-02-18 (×2): 10 mg via ORAL
  Filled 2023-02-17 (×2): qty 1

## 2023-02-17 NOTE — Progress Notes (Signed)
Physical Therapy Treatment Patient Details Name: Madison West MRN: [id number] DOB: August 28, 1955 Today's Date: 02/17/2023   History of Present Illness Pt is a 67 y.o. female admitted 01/24/23 with acute CHF. S/p LHC 8/11 with mid LAD 95% lesion, stent placed. VTACH arrest x2 on 8/13 requiring CPR and multiple shocks for ROSC; found to have NSTEMI. S/p IABP placement 8/13-8/17. S/p TAVR 8/15. ETT 8/13-8/18. PMH includes PSVT, CAD, HTN, HLD, breast CA s/p lumpectomy, chemo, anxiety, depression.    PT Comments  Pt is progressing well towards goals. Pt improved in ability to ambulate further (110 ft with RW and 2L [place]). Pt continues to fatigue easily with decreased endurance and LE strength. Pt became lightheaded after ambulating 110 ft and needed a seated rest break. Pt's VSS (see below), however, returned pt to room in the chair due to increased lightheadedness. Symptoms likely due higher exertion during today's session. Pt remains appropriate for >3 hour therapy to address functional mobility deficits. Acute PT to follow.     If plan is discharge home, recommend the following: A lot of help with walking and/or transfers;A lot of help with bathing/dressing/bathroom;Assistance with cooking/housework;Assist for transportation;Help with stairs or ramp for entrance   Can travel by private vehicle        Equipment Recommendations  Other (comment) (determined at next LOC)    Recommendations for Other Services       Precautions / Restrictions Precautions Precautions: Fall Restrictions Weight Bearing Restrictions: No     Mobility  Bed Mobility               General bed mobility comments: nt, pt in chair upon arrival and returned to chair at end of session    Transfers Overall transfer level: Needs assistance Equipment used: Rolling walker (2 wheels) Transfers: Sit to/from Stand (x2) Sit to Stand: Min assist           General transfer comment: MinA for initial rise from chair.  Cues to push with R UE from the chair due to pain in L UE. Cues for proper technique for sit/stand with RW management    Ambulation/Gait Ambulation/Gait assistance: Contact guard assist, +2 safety/equipment (chair follow) Gait Distance (Feet): 110 Feet Assistive device: Rolling walker (2 wheels) Gait Pattern/deviations: Step-to pattern, Decreased stride length, Shuffle Gait velocity: dec     General Gait Details: significantly decreased gait speed, pt reports feeling "out of shape"   Stairs             Wheelchair Mobility     Tilt Bed    Modified Rankin (Stroke Patients Only)       Balance Overall balance assessment: Needs assistance Sitting-balance support: Feet supported Sitting balance-Leahy Scale: Good Sitting balance - Comments: able to sit edge of chair with no UE support   Standing balance support: Bilateral upper extremity supported, During functional activity, Reliant on assistive device for balance Standing balance-Leahy Scale: Poor Standing balance comment: UE support and assist for balance                            Cognition Arousal: Alert Behavior During Therapy: WFL for tasks assessed/performed Overall Cognitive Status: Within Functional Limits for tasks assessed                                          Exercises  General Comments General comments (skin integrity, edema, etc.): 2L [place] during ambulation, SpO2 95-97 and HR ~85 after walking 110 feet. Pt reported feeling lightheaded after walking ~110 ft and took a seated rest break. Upon standing, pt reported increased lightheadedness. Pt was pushed to the room in the chair to further assess vitals. All VSS.      Pertinent Vitals/Pain Pain Assessment Pain Assessment: Faces Faces Pain Scale: Hurts a little bit Pain Location: L Arm and chest Pain Descriptors / Indicators: Discomfort, Sore Pain Intervention(s): Limited activity within patient's tolerance,  Monitored during session, Repositioned    Home Living                          Prior Function            PT Goals (current goals can now be found in the care plan section) Progress towards PT goals: Progressing toward goals    Frequency    Min 1X/week      PT Plan      Co-evaluation              AM-PAC PT "6 Clicks" Mobility   Outcome Measure  Help needed turning from your back to your side while in a flat bed without using bedrails?: A Little Help needed moving from lying on your back to sitting on the side of a flat bed without using bedrails?: A Little Help needed moving to and from a bed to a chair (including a wheelchair)?: A Little Help needed standing up from a chair using your arms (e.g., wheelchair or bedside chair)?: A Little Help needed to walk in hospital room?: A Little Help needed climbing 3-5 steps with a railing? : A Lot 6 Click Score: 17    End of Session Equipment Utilized During Treatment: Gait belt;Oxygen Activity Tolerance: Other (comment);Patient limited by fatigue (Pt limited by lightheadedness) Patient left: in chair;with call bell/phone within reach;with chair alarm set Nurse Communication: Mobility status PT Visit Diagnosis: Other abnormalities of gait and mobility (R26.89);Muscle weakness (generalized) (M62.81)     Time: 7425-9563 PT Time Calculation (min) (ACUTE ONLY): 29 min  Charges:    $Gait Training: 8-22 mins $Therapeutic Activity: 8-22 mins PT General Charges $$ ACUTE PT VISIT: 1 Visit                     Hilton Cork, PT, DPT Secure Chat Preferred  Rehab Office [phone]    Arturo Morton Brion Aliment 02/17/2023, 1:57 PM

## 2023-02-17 NOTE — Progress Notes (Signed)
Pt finishing up working with OT. Walked with PT earlier today. Will f/u as time allows.  Ethelda Chick BS, ACSM-CEP 02/17/2023 2:09 PM

## 2023-02-17 NOTE — Progress Notes (Signed)
Occupational Therapy Treatment Patient Details Name: Madison West MRN: [id number] DOB: [birth year] Today's Date: 02/17/2023   History of present illness Pt is a 67 y.o. female admitted 01/24/23 with acute CHF. S/p LHC 8/11 with mid LAD 95% lesion, stent placed. VTACH arrest x2 on 8/13 requiring CPR and multiple shocks for ROSC; found to have NSTEMI. S/p IABP placement 8/13-8/17. S/p TAVR 8/15. ETT 8/13-8/18. PMH includes PSVT, CAD, HTN, HLD, breast CA s/p lumpectomy, chemo, anxiety, depression.   OT comments  Pt feeling fatigued today, but able to actively participate with increased need for rest breaks. Pt O2 at 89 sitting for 2-3 minutes at beginning of session, increased to 95 and stayed stable throughout session. BP 131/91 at end of session sitting. Pt able to perform transfers/ambulation around room with increased time, small steps, with CGA and rest breaks. Pt able to complete LB dressing with CGA, increased time, pain in chest when reaching/bending down. Pt DC plan to postacute >3hrs/day still appropriate, will be followed acutely to maximize activity tolerance as able.       If plan is discharge home, recommend the following:  Two people to help with walking and/or transfers;A lot of help with bathing/dressing/bathroom;Assistance with cooking/housework;Direct supervision/assist for medications management;Direct supervision/assist for financial management;Assist for transportation   Equipment Recommendations  Other (comment) (defer)    Recommendations for Other Services      Precautions / Restrictions Precautions Precautions: Fall Precaution Comments: multiple lines/leads, high anxiety Restrictions Weight Bearing Restrictions: No       Mobility Bed Mobility               General bed mobility comments: arrived in recliner    Transfers Overall transfer level: Needs assistance Equipment used: Rolling walker (2 wheels) Transfers: Sit to/from Stand Sit to Stand:  Contact guard assist     Step pivot transfers: Contact guard assist     General transfer comment: able to perfor multiple transfers with CGA, increased need for rest breaks, O2 levels remained stable     Balance Overall balance assessment: Needs assistance Sitting-balance support: Feet supported Sitting balance-Leahy Scale: Good Sitting balance - Comments: recliner ADLs   Standing balance support: During functional activity, Bilateral upper extremity supported Standing balance-Leahy Scale: Fair Standing balance comment: able to perform ambulation around room, transfers, standing ADLs, able to reach for items using one hand on RW for support, no LOB                           ADL either performed or assessed with clinical judgement   ADL Overall ADL's : Needs assistance/impaired     Grooming: Set up;Sitting           Upper Body Dressing : Minimal assistance;Sitting Upper Body Dressing Details (indicate cue type and reason): min A due to line management Lower Body Dressing: Contact guard assist;Sit to/from stand   Toilet Transfer: Contact guard assist;BSC/3in1;Rolling walker (2 wheels)   Toileting- Clothing Manipulation and Hygiene: Minimal assistance;Sitting/lateral lean       Functional mobility during ADLs: Contact guard assist;Rolling walker (2 wheels) General ADL Comments: Pt displays significant decreased activity tolerance, but able to complete most tasks with minimal physical assistance    Extremity/Trunk Assessment Upper Extremity Assessment Upper Extremity Assessment: Generalized weakness            Vision       Perception     Praxis      Cognition Arousal: Alert Behavior  During Therapy: WFL for tasks assessed/performed Overall Cognitive Status: Within Functional Limits for tasks assessed                                          Exercises      Shoulder Instructions       General Comments 2L [place] during  ambulation, SpO2 95-97 and HR ~85 after walking 110 feet. Pt reported feeling lightheaded after walking ~110 ft and took a seated rest break. Upon standing, pt reported increased lightheadedness. Pt was pushed to the room in the chair to further assess vitals. All VSS.    Pertinent Vitals/ Pain       Pain Assessment Pain Assessment: No/denies pain  Home Living                                          Prior Functioning/Environment              Frequency  Min 1X/week        Progress Toward Goals  OT Goals(current goals can now be found in the care plan section)  Progress towards OT goals: Progressing toward goals  Acute Rehab OT Goals Patient Stated Goal: to improve activity tolerance and strength, return home OT Goal Formulation: With patient Time For Goal Achievement: 02/22/23 Potential to Achieve Goals: Good ADL Goals Pt Will Perform Upper Body Dressing: with min assist;sitting Pt Will Perform Lower Body Dressing: with min assist;sitting/lateral leans;sit to/from stand Pt Will Transfer to Toilet: with min assist;squat pivot transfer;stand pivot transfer;bedside commode Additional ADL Goal #1: pt will perform bed mobility minA in prep for ADLs  Plan      Co-evaluation                 AM-PAC OT "6 Clicks" Daily Activity     Outcome Measure   Help from another person eating meals?: None Help from another person taking care of personal grooming?: A Little Help from another person toileting, which includes using toliet, bedpan, or urinal?: A Little Help from another person bathing (including washing, rinsing, drying)?: A Little Help from another person to put on and taking off regular upper body clothing?: A Little Help from another person to put on and taking off regular lower body clothing?: A Little 6 Click Score: 19    End of Session Equipment Utilized During Treatment: Gait belt;Rolling walker (2 wheels);Oxygen  OT Visit Diagnosis:  Unsteadiness on feet (R26.81);Other abnormalities of gait and mobility (R26.89);Muscle weakness (generalized) (M62.81)   Activity Tolerance Patient tolerated treatment well   Patient Left in chair;with call bell/phone within reach;with chair alarm set   Nurse Communication Mobility status        Time: 1610-9604 OT Time Calculation (min): 32 min  Charges: OT General Charges $OT Visit: 1 Visit OT Treatments $Self Care/Home Management : 8-22 mins $Therapeutic Activity: 8-22 mins  Omarie Parcell, OTR/L   Alexis Goodell 02/17/2023, 2:29 PM

## 2023-02-17 NOTE — Plan of Care (Signed)

## 2023-02-17 NOTE — Progress Notes (Signed)
Inpatient Rehab Admissions Coordinator:    I do not have a CIR bed for this Pt. Today. I will follow for potential admit pending bed availability.   Laura Staley, MS, CCC-SLP Rehab Admissions Coordinator  [phone] (celll) [phone] (office)  

## 2023-02-17 NOTE — Progress Notes (Signed)
Upper extremity venous left study completed.   Please see CV Proc for preliminary results.   Rachel Hodge, RDMS, RVT   

## 2023-02-17 NOTE — Progress Notes (Signed)
I met with the patient on 02/17/2023. She has had a prolonged hospital course with multiple cardiac and medical issues. Heart failure team has been following all along, appreciate their help. Patient is anxious, but looking forward to inpatient rehab. I wishes her the best.   Elder Negus, MD Pager: [phone] Office: [phone]

## 2023-02-17 NOTE — Progress Notes (Addendum)
Patient ID: Madison West, female   DOB: November 24, 1955, 67 y.o.   MRN: [id number]     Advanced Heart Failure Rounding Note  PCP-Cardiologist: None   Subjective:   - 8/15 s/p TAVR. Coronary angiogram w/ widely patent stent. Post op echo w/ no paravalvular leak. - Echo (8/16): EF 35-40%, mild LVH, WMAs, RV normal, bioprosthetic aortic valve s/p TAVR with mean gradient 13 mmH, trivial PVL, IVC mildly dilated.  - IABP removed 8/17 - extubated 8/18 -8/20 Completed abx for PNA -8/23 Milrinone stopped.   Hypotensive briefly overnight. SBP better this am.   Scr stable.   Foley removed last night. Notes episodes of urinary incontinence (chronic even prior to admission).  Nervous about going to CIR.  Notes left forearm swelling and pain this am  Objective:   Weight Range: 100.4 kg Body mass index is 33.65 kg/m.   Vital Signs:   Temp:  [97.7 F (36.5 C)-98.1 F (36.7 C)] 97.7 F (36.5 C) (08/28 0755) Pulse Rate:  [70-85] 78 (08/28 0755) Resp:  [17-19] 18 (08/28 0755) BP: (87-175)/(41-111) 119/55 (08/28 0755) SpO2:  [91 %-93 %] 93 % (08/28 0755) Weight:  [100.4 kg] 100.4 kg (08/28 0453) Last BM Date : 02/16/23  Weight change: Filed Weights   02/16/23 0300 02/16/23 0900 02/17/23 0453  Weight: 102.2 kg 100.4 kg 100.4 kg    Intake/Output:   Intake/Output Summary (Last 24 hours) at 02/17/2023 1016 Last data filed at 02/17/2023 0816 Gross per 24 hour  Intake 460 ml  Output 1750 ml  Net -1290 ml    Physical Exam  General: Fatigued appearing. Sitting up in chair. HEENT: normal Neck: supple. no JVD. Carotids 2+ bilat; no bruits.  Cor: PMI nondisplaced. Regular rate & rhythm. No rubs, gallops or murmurs. Lungs: clear Abdomen: obese, soft, nontender, nondistended.  Extremities: no cyanosis, clubbing, rash, edema, left brachial picc Neuro: alert & orientedx3. Affect pleasant   Telemetry   SR 60s-70s  Labs    CBC Recent Labs    02/16/23 0215 02/17/23 0500  WBC  7.4 7.0  HGB 8.3* 8.6*  HCT 25.3* 26.4*  MCV 96.2 96.4  PLT 469* 495*   Basic Metabolic Panel Recent Labs    21/30/86 0215 02/17/23 0500  NA 132* 133*  K 4.6 4.1  CL 91* 95*  CO2 33* 29  GLUCOSE 111* 115*  BUN 19 18  CREATININE 0.75 0.78  CALCIUM 8.9 9.0  PHOS 5.0* 4.6   Liver Function Tests Recent Labs    02/16/23 0215 02/17/23 0500  ALBUMIN 2.4* 2.7*    No results for input(s): "LIPASE", "AMYLASE" in the last 72 hours. Cardiac Enzymes No results for input(s): "CKTOTAL", "CKMB", "CKMBINDEX", "TROPONINI" in the last 72 hours.  BNP: BNP (last 3 results) Recent Labs    01/23/23 2208 02/14/23 0346  BNP 462.3* 418.7*   ProBNP (last 3 results) No results for input(s): "PROBNP" in the last 8760 hours.  D-Dimer No results for input(s): "DDIMER" in the last 72 hours. Hemoglobin A1C No results for input(s): "HGBA1C" in the last 72 hours.  Fasting Lipid Panel No results for input(s): "CHOL", "HDL", "LDLCALC", "TRIG", "CHOLHDL", "LDLDIRECT" in the last 72 hours.  Thyroid Function Tests No results for input(s): "TSH", "T4TOTAL", "T3FREE", "THYROIDAB" in the last 72 hours.  Invalid input(s): "FREET3"  Other results:  Imaging    No results found.   Medications:   Scheduled Medications:  acetaminophen  650 mg Oral Q6H   ALPRAZolam  3 mg Oral BID  amiodarone  200 mg Oral BID   apixaban  5 mg Oral BID   aspirin  81 mg Oral Daily   busPIRone  10 mg Oral BID   Chlorhexidine Gluconate Cloth  6 each Topical Daily   clopidogrel  75 mg Oral Daily   docusate sodium  100 mg Oral BID   feeding supplement  237 mL Oral BID BM   FLUoxetine  20 mg Oral QHS   gabapentin  300 mg Oral TID   melatonin  3 mg Oral Daily   methocarbamol  500 mg Oral BID   polyethylene glycol  17 g Oral BID   rosuvastatin  20 mg Oral Daily   sacubitril-valsartan  1 tablet Oral BID   senna-docusate  1 tablet Oral BID   sodium chloride flush  10-40 mL Intracatheter Q12H   sodium  chloride flush  3 mL Intravenous Q12H   sorbitol  30 mL Oral Once   spironolactone  25 mg Oral Daily    Infusions:  sodium chloride     sodium chloride Stopped (02/08/23 0416)   sodium chloride     sodium chloride     sodium chloride      PRN Medications: sodium chloride, sodium chloride, Place/Maintain arterial line **AND** sodium chloride, ALPRAZolam, alum & mag hydroxide-simeth, bisacodyl, calcium carbonate, HYDROmorphone (DILAUDID) injection, hydrOXYzine, ipratropium-albuterol, lidocaine, lidocaine, nitroGLYCERIN, ondansetron (ZOFRAN) IV, mouth rinse, mouth rinse, mouth rinse, oxyCODONE, sodium chloride flush, sodium chloride flush  Patient Profile  67 y.o. female with history of CAD s/p PCI to RCA in 2021, carotid artery stenosis s/p R CEA, aortic valve stenosis. Admitted with unstable angina. Found to have severe LAD stenosis s/p PCI and severe low flow low gradient AS. Coarse c/b refractory VT and hemodynamic instability >> cardiogenic shock. Assessment/Plan  Acute systolic CHF >> cardiogenic shock - In setting of severe ischemic heart disease, reduced EF, severe AS and refractory VT/VF - Echo 01/25/23: EF 30-35%, septum and apex HK, RV low normal, low flow low graident severe AS with mean gradient 20 mmHg, AVA 0.93 cm2, DI 0.33 - echo 02/01/23: EF 20-25%, RV okay, severe low flow low gradient severe AS with mean gradient 25 mmHg, AVA 1.1 cm2 - s/p PCI/stent to LAD. Stent thrombosis on 08/09 treated with thrombectomy.  - IABP placed 08/13. Had considered [facility] ECMO but hemodynamics improved with inotrope support - S/p TAVR 8/15 - Echo 8/16 with EF 35-40%, mild LVH, WMAs, RV normal, bioprosthetic aortic valve s/p TAVR with mean gradient 13 mmH, trivial PVL, IVC mildly dilated.  - IABP removed 8/17.  Stable off milrinone.  - Briefly hypotensive overnight but BP better today.  - Volume status still appears on lower end. May restart lasix tomorrow. - Continue lower dose of Entresto  49/51 mg BID - Continue spiro 25 mg daily - ? SGLT2i with chronic urinary incontinence Suspect due to  over diuresis and higher dose of entresto.  - No BB for now with low output  2. Refractory VT - S/p multiple shocks.  - No further VT - continue amio 200 mg twice a day.  K stable.   3. CAD NSTEMI -HX PCI to RCA in 2021 -Presented with unstable angina. S/p PCI/stent to LAD on 08/07. Stent thrombosis 08/09 treated with thrombectomy -HS troponin > 24K on 08/12 -LHC 08/12 with patent LAD stent -LAD appeared widely patent on TAVR angiogram - No chest pain.  - Remains on DAPT with ASA 81 and Plavix for LAD PCI. Also on apixaban for  left internal jugular DVT. Would continue triple therapy for now given history of stent thrombosis.   4. LFLG Severe AS -Noted on echo this admit -Not candidate for SAVR -s/p TAVR 8/15, valve stable on 8/15 echo with trivial PVL and low gradient.   5. Carotid artery stenosis -S/p R CEA in 2020  6. ID -Developed sepsis 08/13. PNA Completed antibiotics.   7. Respiratory failure - Extubated 8/18.  - Completed antibiotic course for pneumonia.  - Sats stable on 2 liters [place].  - Needs ongoing pulmonary toilet.   8. Hyponatremia -suspected hypervolemic hyponatremia -improved to 133 today - restrict free water  9. DVT left internal jugular - Continue eliquis, reducing to 5 mg BID today  10. Anemia  -Hgb stable 8.6  -No obvious source of bleeding.   11. Left forearm swelling/tenderness -check [place]  Needs to mobilize today.  Okay for discharge to CIR from HF standpoint. Potentially going tomorrow if bed available.    FIN[facility], Madison N, PA-C  10:16 AM  Patient seen with PA, agree with the above note.   Nervous about going to CIR.  Creatinine stable, BP stable.   Repeat UE venous dopplers showed resolved LIJ DVT, still present superficial cephalic vein thrombosis.   General: NAD Neck: No JVD, no thyromegaly or thyroid nodule.  Lungs:  Clear to auscultation bilaterally with normal respiratory effort. CV: Nondisplaced PMI.  Heart regular S1/S2, no S3/S4, 2/6 early SEM RUSB.  No peripheral edema.   Abdomen: Soft, nontender, no hepatosplenomegaly, no distention.  Skin: Intact without lesions or rashes.  Neurologic: Alert and oriented x 3.  Psych: Normal affect. Extremities: No clubbing or cyanosis.  HEENT: Normal.   Ischemic cardiomyopathy, volume status looks ok.  - She can stay off Lasix.  Foley is out, will try her on Farxiga 10 mg daily.  She has some baseline incontinence so will have to watch closely for yeast infections.  - Continue Entresto 49/51 bid. - Continue spironolactone.     No arrythmias, continue amiodarone 200 mg bid for now.    Remains on DAPT with ASA 81 and Plavix for LAD PCI. Also on apixaban for left internal jugular DVT.  Would continue triple therapy for now given history of stent thrombosis.  After 1 month post-PCI, think we can drop ASA.    Should be ready for CIR.   Marca Ancona 02/17/2023 2:24 PM

## 2023-02-18 ENCOUNTER — Inpatient Hospital Stay (HOSPITAL_COMMUNITY)
Admission: RE | Admit: 2023-02-18 | Discharge: 2023-03-05 | DRG: 945 | Disposition: A | Discharge status: Home Health | Payer: Medicare Other | Source: Intra-hospital | Attending: Physical Medicine and Rehabilitation | Admitting: Physical Medicine and Rehabilitation

## 2023-02-18 ENCOUNTER — Other Ambulatory Visit: Payer: Self-pay

## 2023-02-18 ENCOUNTER — Inpatient Hospital Stay (HOSPITAL_COMMUNITY): Payer: Medicare Other

## 2023-02-18 ENCOUNTER — Encounter (HOSPITAL_COMMUNITY): Payer: Self-pay | Admitting: Physical Medicine & Rehabilitation

## 2023-02-18 DIAGNOSIS — Z7901 Long term (current) use of anticoagulants: Secondary | ICD-10-CM

## 2023-02-18 DIAGNOSIS — I5022 Chronic systolic (congestive) heart failure: Secondary | ICD-10-CM | POA: Diagnosis not present

## 2023-02-18 DIAGNOSIS — I472 Ventricular tachycardia, unspecified: Secondary | ICD-10-CM | POA: Diagnosis present

## 2023-02-18 DIAGNOSIS — I110 Hypertensive heart disease with heart failure: Secondary | ICD-10-CM | POA: Diagnosis present

## 2023-02-18 DIAGNOSIS — R0602 Shortness of breath: Secondary | ICD-10-CM | POA: Diagnosis not present

## 2023-02-18 DIAGNOSIS — Z953 Presence of xenogenic heart valve: Secondary | ICD-10-CM | POA: Diagnosis not present

## 2023-02-18 DIAGNOSIS — E669 Obesity, unspecified: Secondary | ICD-10-CM | POA: Diagnosis present

## 2023-02-18 DIAGNOSIS — I5021 Acute systolic (congestive) heart failure: Secondary | ICD-10-CM | POA: Diagnosis present

## 2023-02-18 DIAGNOSIS — I4901 Ventricular fibrillation: Secondary | ICD-10-CM | POA: Diagnosis present

## 2023-02-18 DIAGNOSIS — F32A Depression, unspecified: Secondary | ICD-10-CM | POA: Diagnosis present

## 2023-02-18 DIAGNOSIS — E871 Hypo-osmolality and hyponatremia: Secondary | ICD-10-CM | POA: Diagnosis present

## 2023-02-18 DIAGNOSIS — D62 Acute posthemorrhagic anemia: Secondary | ICD-10-CM | POA: Diagnosis present

## 2023-02-18 DIAGNOSIS — Z602 Problems related to living alone: Secondary | ICD-10-CM | POA: Diagnosis present

## 2023-02-18 DIAGNOSIS — E559 Vitamin D deficiency, unspecified: Secondary | ICD-10-CM | POA: Diagnosis present

## 2023-02-18 DIAGNOSIS — Z83719 Family history of colon polyps, unspecified: Secondary | ICD-10-CM

## 2023-02-18 DIAGNOSIS — Z885 Allergy status to narcotic agent status: Secondary | ICD-10-CM

## 2023-02-18 DIAGNOSIS — N179 Acute kidney failure, unspecified: Secondary | ICD-10-CM | POA: Diagnosis not present

## 2023-02-18 DIAGNOSIS — Z86718 Personal history of other venous thrombosis and embolism: Secondary | ICD-10-CM

## 2023-02-18 DIAGNOSIS — Z9011 Acquired absence of right breast and nipple: Secondary | ICD-10-CM

## 2023-02-18 DIAGNOSIS — I952 Hypotension due to drugs: Secondary | ICD-10-CM | POA: Diagnosis present

## 2023-02-18 DIAGNOSIS — R570 Cardiogenic shock: Secondary | ICD-10-CM

## 2023-02-18 DIAGNOSIS — I428 Other cardiomyopathies: Secondary | ICD-10-CM | POA: Diagnosis present

## 2023-02-18 DIAGNOSIS — I214 Non-ST elevation (NSTEMI) myocardial infarction: Secondary | ICD-10-CM | POA: Diagnosis present

## 2023-02-18 DIAGNOSIS — I779 Disorder of arteries and arterioles, unspecified: Secondary | ICD-10-CM | POA: Diagnosis present

## 2023-02-18 DIAGNOSIS — Z79899 Other long term (current) drug therapy: Secondary | ICD-10-CM

## 2023-02-18 DIAGNOSIS — I82612 Acute embolism and thrombosis of superficial veins of left upper extremity: Secondary | ICD-10-CM | POA: Diagnosis present

## 2023-02-18 DIAGNOSIS — Z87891 Personal history of nicotine dependence: Secondary | ICD-10-CM

## 2023-02-18 DIAGNOSIS — E785 Hyperlipidemia, unspecified: Secondary | ICD-10-CM | POA: Diagnosis present

## 2023-02-18 DIAGNOSIS — D75839 Thrombocytosis, unspecified: Secondary | ICD-10-CM | POA: Diagnosis present

## 2023-02-18 DIAGNOSIS — I5020 Unspecified systolic (congestive) heart failure: Secondary | ICD-10-CM

## 2023-02-18 DIAGNOSIS — I255 Ischemic cardiomyopathy: Secondary | ICD-10-CM | POA: Diagnosis present

## 2023-02-18 DIAGNOSIS — Z7902 Long term (current) use of antithrombotics/antiplatelets: Secondary | ICD-10-CM

## 2023-02-18 DIAGNOSIS — G4700 Insomnia, unspecified: Secondary | ICD-10-CM | POA: Diagnosis not present

## 2023-02-18 DIAGNOSIS — Z7982 Long term (current) use of aspirin: Secondary | ICD-10-CM

## 2023-02-18 DIAGNOSIS — R5381 Other malaise: Secondary | ICD-10-CM | POA: Diagnosis present

## 2023-02-18 DIAGNOSIS — Z833 Family history of diabetes mellitus: Secondary | ICD-10-CM

## 2023-02-18 DIAGNOSIS — Z923 Personal history of irradiation: Secondary | ICD-10-CM

## 2023-02-18 DIAGNOSIS — T501X5A Adverse effect of loop [high-ceiling] diuretics, initial encounter: Secondary | ICD-10-CM | POA: Diagnosis present

## 2023-02-18 DIAGNOSIS — R7303 Prediabetes: Secondary | ICD-10-CM | POA: Diagnosis present

## 2023-02-18 DIAGNOSIS — T82867D Thrombosis of cardiac prosthetic devices, implants and grafts, subsequent encounter: Secondary | ICD-10-CM | POA: Diagnosis not present

## 2023-02-18 DIAGNOSIS — I447 Left bundle-branch block, unspecified: Secondary | ICD-10-CM | POA: Diagnosis present

## 2023-02-18 DIAGNOSIS — I959 Hypotension, unspecified: Secondary | ICD-10-CM | POA: Diagnosis not present

## 2023-02-18 DIAGNOSIS — Z800 Family history of malignant neoplasm of digestive organs: Secondary | ICD-10-CM

## 2023-02-18 DIAGNOSIS — R339 Retention of urine, unspecified: Secondary | ICD-10-CM | POA: Diagnosis present

## 2023-02-18 DIAGNOSIS — Z8701 Personal history of pneumonia (recurrent): Secondary | ICD-10-CM

## 2023-02-18 DIAGNOSIS — Z853 Personal history of malignant neoplasm of breast: Secondary | ICD-10-CM

## 2023-02-18 DIAGNOSIS — F419 Anxiety disorder, unspecified: Secondary | ICD-10-CM | POA: Diagnosis present

## 2023-02-18 DIAGNOSIS — Z86010 Personal history of colonic polyps: Secondary | ICD-10-CM

## 2023-02-18 DIAGNOSIS — K5900 Constipation, unspecified: Secondary | ICD-10-CM | POA: Diagnosis present

## 2023-02-18 DIAGNOSIS — J9691 Respiratory failure, unspecified with hypoxia: Secondary | ICD-10-CM

## 2023-02-18 DIAGNOSIS — Z87412 Personal history of vulvar dysplasia: Secondary | ICD-10-CM

## 2023-02-18 DIAGNOSIS — I5023 Acute on chronic systolic (congestive) heart failure: Secondary | ICD-10-CM | POA: Diagnosis not present

## 2023-02-18 DIAGNOSIS — Z8679 Personal history of other diseases of the circulatory system: Secondary | ICD-10-CM

## 2023-02-18 DIAGNOSIS — Z888 Allergy status to other drugs, medicaments and biological substances status: Secondary | ICD-10-CM

## 2023-02-18 DIAGNOSIS — Z8249 Family history of ischemic heart disease and other diseases of the circulatory system: Secondary | ICD-10-CM

## 2023-02-18 DIAGNOSIS — I2510 Atherosclerotic heart disease of native coronary artery without angina pectoris: Secondary | ICD-10-CM | POA: Diagnosis present

## 2023-02-18 DIAGNOSIS — Z8619 Personal history of other infectious and parasitic diseases: Secondary | ICD-10-CM

## 2023-02-18 DIAGNOSIS — I493 Ventricular premature depolarization: Secondary | ICD-10-CM | POA: Diagnosis present

## 2023-02-18 DIAGNOSIS — Z6834 Body mass index (BMI) 34.0-34.9, adult: Secondary | ICD-10-CM

## 2023-02-18 LAB — RENAL FUNCTION PANEL
Albumin: 3.1 g/dL — ABNORMAL LOW (ref 3.5–5.0)
Anion gap: 9 (ref 5–15)
BUN: 17 mg/dL (ref 8–23)
CO2: 30 mmol/L (ref 22–32)
Calcium: 9.3 mg/dL (ref 8.9–10.3)
Chloride: 95 mmol/L — ABNORMAL LOW (ref 98–111)
Creatinine, Ser: 0.88 mg/dL (ref 0.44–1.00)
GFR, Estimated: >60 mL/min (ref >60)
Glucose, Bld: 112 mg/dL — ABNORMAL HIGH (ref 70–99)
Phosphorus: 4.9 mg/dL — ABNORMAL HIGH (ref 2.5–4.6)
Potassium: 4.5 mmol/L (ref 3.5–5.1)
Sodium: 134 mmol/L — ABNORMAL LOW (ref 135–145)

## 2023-02-18 LAB — GLUCOSE, CAPILLARY
Glucose-Capillary: 111 mg/dL — ABNORMAL HIGH (ref 70–99)
Glucose-Capillary: 121 mg/dL — ABNORMAL HIGH (ref 70–99)

## 2023-02-18 LAB — MAGNESIUM: Magnesium: 2.2 mg/dL (ref 1.7–2.4)

## 2023-02-18 MED ORDER — PROCHLORPERAZINE MALEATE 5 MG PO TABS: 5.0000 mg | ORAL_TABLET | Freq: Four times a day (QID) | ORAL | Status: DC | PRN

## 2023-02-18 MED ORDER — LIDOCAINE 5 % EX PTCH: 2.0000 | MEDICATED_PATCH | Freq: Every day | CUTANEOUS | Status: DC | PRN

## 2023-02-18 MED ORDER — SACUBITRIL-VALSARTAN 49-51 MG PO TABS: 1.0000 | ORAL_TABLET | Freq: Two times a day (BID) | ORAL | Status: DC

## 2023-02-18 MED ORDER — BUSPIRONE HCL 10 MG PO TABS
10.0000 mg | ORAL_TABLET | Freq: Two times a day (BID) | ORAL | Status: DC
  Administered 2023-02-18 – 2023-03-05 (×30): 10 mg via ORAL
  Filled 2023-02-18 (×30): qty 1

## 2023-02-18 MED ORDER — SPIRONOLACTONE 25 MG PO TABS
25.0000 mg | ORAL_TABLET | Freq: Every day | ORAL | Status: DC
  Administered 2023-02-19 – 2023-02-26 (×8): 25 mg via ORAL
  Filled 2023-02-18 (×8): qty 1

## 2023-02-18 MED ORDER — PROCHLORPERAZINE EDISYLATE 10 MG/2ML IJ SOLN: 5.0000 mg | Freq: Four times a day (QID) | INTRAMUSCULAR | Status: DC | PRN

## 2023-02-18 MED ORDER — DAPAGLIFLOZIN PROPANEDIOL 10 MG PO TABS: 10.0000 mg | ORAL_TABLET | Freq: Every day | ORAL | Status: DC

## 2023-02-18 MED ORDER — CLOPIDOGREL BISULFATE 75 MG PO TABS
75.0000 mg | ORAL_TABLET | Freq: Every day | ORAL | Status: DC
  Administered 2023-02-19 – 2023-03-05 (×15): 75 mg via ORAL
  Filled 2023-02-18 (×15): qty 1

## 2023-02-18 MED ORDER — CLOPIDOGREL BISULFATE 75 MG PO TABS: 75.0000 mg | ORAL_TABLET | Freq: Every day | ORAL | Status: DC

## 2023-02-18 MED ORDER — ROSUVASTATIN CALCIUM 20 MG PO TABS: 20.0000 mg | ORAL_TABLET | Freq: Every day | ORAL | Status: DC

## 2023-02-18 MED ORDER — ACETAMINOPHEN 325 MG PO TABS
650.0000 mg | ORAL_TABLET | Freq: Four times a day (QID) | ORAL | Status: DC
  Administered 2023-02-18 – 2023-03-05 (×57): 650 mg via ORAL
  Filled 2023-02-18 (×57): qty 2

## 2023-02-18 MED ORDER — MELATONIN 3 MG PO TABS
3.0000 mg | ORAL_TABLET | Freq: Every day | ORAL | Status: DC
  Administered 2023-02-18 – 2023-03-04 (×15): 3 mg via ORAL
  Filled 2023-02-18 (×15): qty 1

## 2023-02-18 MED ORDER — DAPAGLIFLOZIN PROPANEDIOL 10 MG PO TABS
10.0000 mg | ORAL_TABLET | Freq: Every day | ORAL | Status: DC
  Administered 2023-02-19 – 2023-03-05 (×15): 10 mg via ORAL
  Filled 2023-02-18 (×15): qty 1

## 2023-02-18 MED ORDER — POLYETHYLENE GLYCOL 3350 17 G PO PACK: 17.0000 g | PACK | Freq: Every day | ORAL | Status: DC | PRN

## 2023-02-18 MED ORDER — GABAPENTIN 300 MG PO CAPS
300.0000 mg | ORAL_CAPSULE | Freq: Three times a day (TID) | ORAL | Status: DC
  Administered 2023-02-18 – 2023-03-05 (×46): 300 mg via ORAL
  Filled 2023-02-18 (×46): qty 1

## 2023-02-18 MED ORDER — APIXABAN 5 MG PO TABS: 5.0000 mg | ORAL_TABLET | Freq: Two times a day (BID) | ORAL | Status: DC

## 2023-02-18 MED ORDER — BUSPIRONE HCL 10 MG PO TABS: 10.0000 mg | ORAL_TABLET | Freq: Two times a day (BID) | ORAL | Status: DC

## 2023-02-18 MED ORDER — BISACODYL 10 MG RE SUPP: 10.0000 mg | Freq: Every day | RECTAL | Status: DC | PRN

## 2023-02-18 MED ORDER — APIXABAN 5 MG PO TABS
5.0000 mg | ORAL_TABLET | Freq: Two times a day (BID) | ORAL | Status: DC
  Administered 2023-02-18 – 2023-03-05 (×30): 5 mg via ORAL
  Filled 2023-02-18 (×30): qty 1

## 2023-02-18 MED ORDER — GUAIFENESIN-DM 100-10 MG/5ML PO SYRP: 10.0000 mL | ORAL_SOLUTION | Freq: Four times a day (QID) | ORAL | Status: DC | PRN

## 2023-02-18 MED ORDER — SPIRONOLACTONE 25 MG PO TABS: 25.0000 mg | ORAL_TABLET | Freq: Every day | ORAL | Status: DC

## 2023-02-18 MED ORDER — METHOCARBAMOL 500 MG PO TABS: 500.0000 mg | ORAL_TABLET | Freq: Two times a day (BID) | ORAL | Status: DC

## 2023-02-18 MED ORDER — FLEET ENEMA RE ENEM: 1.0000 | ENEMA | Freq: Once | RECTAL | Status: DC | PRN

## 2023-02-18 MED ORDER — DOCUSATE SODIUM 100 MG PO CAPS
100.0000 mg | ORAL_CAPSULE | Freq: Two times a day (BID) | ORAL | Status: DC
  Administered 2023-02-18 – 2023-03-04 (×29): 100 mg via ORAL
  Filled 2023-02-18 (×30): qty 1

## 2023-02-18 MED ORDER — METHOCARBAMOL 500 MG PO TABS
500.0000 mg | ORAL_TABLET | Freq: Two times a day (BID) | ORAL | Status: DC
  Administered 2023-02-18 – 2023-03-05 (×30): 500 mg via ORAL
  Filled 2023-02-18 (×30): qty 1

## 2023-02-18 MED ORDER — ENSURE ENLIVE PO LIQD
237.0000 mL | Freq: Two times a day (BID) | ORAL | Status: DC
  Administered 2023-02-18 – 2023-02-21 (×4): 237 mL via ORAL

## 2023-02-18 MED ORDER — LIDOCAINE 5 % EX PTCH
1.0000 | MEDICATED_PATCH | CUTANEOUS | Status: DC
  Administered 2023-02-18 – 2023-02-20 (×3): 1 via TRANSDERMAL
  Filled 2023-02-18 (×3): qty 1

## 2023-02-18 MED ORDER — OXYCODONE HCL 5 MG PO TABS
5.0000 mg | ORAL_TABLET | ORAL | Status: DC | PRN
  Administered 2023-02-19 – 2023-03-05 (×53): 5 mg via ORAL
  Filled 2023-02-18 (×54): qty 1

## 2023-02-18 MED ORDER — SENNOSIDES-DOCUSATE SODIUM 8.6-50 MG PO TABS
1.0000 | ORAL_TABLET | Freq: Two times a day (BID) | ORAL | Status: DC
  Administered 2023-02-18 – 2023-03-04 (×29): 1 via ORAL
  Filled 2023-02-18 (×32): qty 1

## 2023-02-18 MED ORDER — FLUOXETINE HCL 20 MG PO CAPS: 20.0000 mg | ORAL_CAPSULE | Freq: Every day | ORAL | Status: DC

## 2023-02-18 MED ORDER — ROSUVASTATIN CALCIUM 20 MG PO TABS
20.0000 mg | ORAL_TABLET | Freq: Every day | ORAL | Status: DC
  Administered 2023-02-19 – 2023-03-05 (×15): 20 mg via ORAL
  Filled 2023-02-18 (×15): qty 1

## 2023-02-18 MED ORDER — ASPIRIN 81 MG PO CHEW
81.0000 mg | CHEWABLE_TABLET | Freq: Every day | ORAL | Status: AC
  Administered 2023-02-19 – 2023-02-27 (×9): 81 mg via ORAL
  Filled 2023-02-18 (×9): qty 1

## 2023-02-18 MED ORDER — POLYETHYLENE GLYCOL 3350 17 G PO PACK
17.0000 g | PACK | Freq: Two times a day (BID) | ORAL | Status: DC
  Administered 2023-02-21 – 2023-03-04 (×19): 17 g via ORAL
  Filled 2023-02-18 (×28): qty 1

## 2023-02-18 MED ORDER — GABAPENTIN 300 MG PO CAPS: 300.0000 mg | ORAL_CAPSULE | Freq: Two times a day (BID) | ORAL | Status: DC

## 2023-02-18 MED ORDER — THE SENSUOUS HEART BOOK
Freq: Once | Status: AC
  Filled 2023-02-18: qty 1

## 2023-02-18 MED ORDER — AMIODARONE HCL 200 MG PO TABS
200.0000 mg | ORAL_TABLET | Freq: Two times a day (BID) | ORAL | Status: DC
  Administered 2023-02-18 – 2023-02-26 (×16): 200 mg via ORAL
  Filled 2023-02-18 (×16): qty 1

## 2023-02-18 MED ORDER — CHLORHEXIDINE GLUCONATE CLOTH 2 % EX PADS: 6.0000 | MEDICATED_PAD | Freq: Every day | CUTANEOUS | Status: DC

## 2023-02-18 MED ORDER — PROCHLORPERAZINE 25 MG RE SUPP: 12.5000 mg | Freq: Four times a day (QID) | RECTAL | Status: DC | PRN

## 2023-02-18 MED ORDER — SODIUM CHLORIDE 0.9 % IV SOLN: 250.0000 mL | INTRAVENOUS | Status: DC | PRN

## 2023-02-18 MED ORDER — SACUBITRIL-VALSARTAN 49-51 MG PO TABS
1.0000 | ORAL_TABLET | Freq: Two times a day (BID) | ORAL | Status: DC
  Administered 2023-02-18 – 2023-02-26 (×16): 1 via ORAL
  Filled 2023-02-18 (×16): qty 1

## 2023-02-18 MED ORDER — AMIODARONE HCL 200 MG PO TABS: 200.0000 mg | ORAL_TABLET | Freq: Two times a day (BID) | ORAL | Status: DC

## 2023-02-18 MED ORDER — ALPRAZOLAM 0.5 MG PO TABS
3.0000 mg | ORAL_TABLET | Freq: Two times a day (BID) | ORAL | Status: DC
  Administered 2023-02-19 – 2023-03-05 (×29): 3 mg via ORAL
  Filled 2023-02-18 (×4): qty 6
  Filled 2023-02-18: qty 12
  Filled 2023-02-18 (×28): qty 6

## 2023-02-18 MED ORDER — ALPRAZOLAM 0.5 MG PO TABS
3.0000 mg | ORAL_TABLET | Freq: Every day | ORAL | Status: DC | PRN
  Administered 2023-02-18 – 2023-02-22 (×4): 3 mg via ORAL
  Administered 2023-02-23: 1.5 mg via ORAL
  Administered 2023-02-24 – 2023-03-01 (×4): 3 mg via ORAL
  Filled 2023-02-18 (×9): qty 6

## 2023-02-18 NOTE — Progress Notes (Signed)
Inpatient Rehabilitation Admission Medication Review by a Pharmacist  A complete drug regimen review was completed for this patient to identify any potential clinically significant medication issues.  High Risk Drug Classes Is patient taking? Indication by Medication  Antipsychotic Yes Compazine - N/V  Anticoagulant Yes Apixaban - DVT  Antibiotic No   Opioid Yes Oxycodone - pain  Antiplatelet Yes ASA/Plavix - CAD  Hypoglycemics/insulin Yes Farxiga - CHF  Vasoactive Medication Yes Amiodarone/Aldactone/Entresto - HTN/CHF/  Chemotherapy No   Other Yes Xanax/Buspar - anxiety Neurontin - neuropathy Lidocaine patch - pain Melatonin - sleep Robaxin - spasms Magnesium - supplement Crestor - HLD     Type of Medication Issue Identified Description of Issue Recommendation(s)  Drug Interaction(s) (clinically significant)     Duplicate Therapy     Allergy     No Medication Administration End Date     Incorrect Dose     Additional Drug Therapy Needed     Significant med changes from prior encounter (inform family/care partners about these prior to discharge).    Other  CoQ10, fish oil, garlic, turmeric, vitamin D, zinc Resume at discharge if needed    Clinically significant medication issues were identified that warrant physician communication and completion of prescribed/recommended actions by midnight of the next day:  No  Name of provider notified for urgent issues identified:   Provider Method of Notification:     Pharmacist comments:   Time spent performing this drug regimen review (minutes):  20   Ulyses Southward, PharmD, [place], AAHIVP, CPP Infectious Disease Pharmacist 02/18/2023 1:16 PM

## 2023-02-18 NOTE — Progress Notes (Signed)
Mobility Specialist Progress Note:    02/18/23 1340  Mobility  Activity Transferred to/from [facility]Transferred from bed to chair  Level of Assistance Contact guard assist, steadying assist  Assistive Device Other (Comment) (HHA)  Distance Ambulated (ft) 6 ft  Activity Response Tolerated well  Mobility Referral Yes  $Mobility charge 1 Mobility  Mobility Specialist Start Time (ACUTE ONLY) 1100  Mobility Specialist Stop Time (ACUTE ONLY) 1120  Mobility Specialist Time Calculation (min) (ACUTE ONLY) 20 min   Pt received in bed, agreeable to participate. Pre session pt requested to use the BSC, void successful, pericare performed independently. Pt was able to take steps to the [facility] and towards the chair w/o fault using HHA. All needs met. Left in chair w/ call bell and personal belongings in reach.   Thompson Grayer Mobility Specialist  Please contact vis Secure Chat or  Rehab Office [phone]

## 2023-02-18 NOTE — Plan of Care (Signed)
Progressing

## 2023-02-18 NOTE — TOC Transition Note (Signed)
Transition of Care [facility]) - CM/SW Discharge Note   Patient Details  Name: Madison West MRN: [id number] Date of Birth: [birth year]  Transition of Care [facility] CM/SW Contact:  Lockie Pares, RN Phone Number: 02/18/2023, 10:00 AM   Clinical Narrative:     Patient to be admitted to CIR today  Final next level of care: IP Rehab Facility Barriers to Discharge: No Barriers Identified   Patient Goals and CMS Choice CMS Medicare.gov Compare Post Acute Care list provided to:: Patient Choice offered to / list presented to : Patient  Discharge Placement                         Discharge Plan and Services Additional resources added to the After Visit Summary for   In-house Referral: NA Discharge Planning Services: CM Consult Post Acute Care Choice: IP Rehab                               Social Determinants of Health (SDOH) Interventions SDOH Screenings   Food Insecurity: No Food Insecurity (01/24/2023)  Housing: Low Risk  (01/24/2023)  Transportation Needs: No Transportation Needs (01/24/2023)  Utilities: Not At Risk (01/24/2023)  Depression (PHQ2-9): Low Risk  (01/19/2020)  Tobacco Use: Medium Risk (02/02/2023)     Readmission Risk Interventions     No data to display

## 2023-02-18 NOTE — Progress Notes (Signed)
Patient ID: Madison West, female   DOB: 09-30-1955, 67 y.o.   MRN: [id number] Advanced Heart Failure Rounding Note   PCP-Cardiologist: None    Subjective:   - 8/15 s/p TAVR. Coronary angiogram w/ widely patent stent. Post op echo w/ no paravalvular leak. - Echo (8/16): EF 35-40%, mild LVH, WMAs, RV normal, bioprosthetic aortic valve s/p TAVR with mean gradient 13 mmH, trivial PVL, IVC mildly dilated.  - IABP removed 8/17 - extubated 8/18 -8/20 Completed abx for PNA -8/23 Milrinone stopped.  - 8/28  Repeat UE venous dopplers showed resolved LIJ DVT, still present superficial cephalic vein thrombosis.    Denies SOB. Anxious about going to CIR.    Objective:   Weight Range: 101.2 kg   Vital Signs:                 Temp:  [97.7 F (36.5 C)-98.6 F (37 C)] 98.6 F (37 C) (08/29 0500) Pulse Rate:  [71-85] 78 (08/29 0500) Resp:  [16-19] 16 (08/29 0500) BP: (101-134)/(55-76) 101/61 (08/29 0500) SpO2:  [90 %-92 %] 90 % (08/29 0500) Weight:  [101.2 kg] 101.2 kg (08/29 0500) Last BM Date : 02/16/23   Weight change:      Filed Weights    02/16/23 0900 02/17/23 0453 02/18/23 0500  Weight: 100.4 kg 100.4 kg 101.2 kg      Intake/Output:              Intake/Output Summary (Last 24 hours) at 02/18/2023 0947 Last data filed at 02/18/2023 0100    Gross per 24 hour  Intake 410 ml  Output 1200 ml  Net -790 ml               Physical Exam  General:   No resp difficulty HEENT: normal Neck: supple. no JVD. Carotids 2+ bilat; no bruits. No lymphadenopathy or thryomegaly appreciated. Cor: PMI nondisplaced. Regular rate & rhythm. No rubs, gallops or murmurs. Lungs: clear on 2 liters. [place]  Abdomen: soft, nontender, nondistended. No hepatosplenomegaly. No bruits or masses. Good bowel sounds. Extremities: no cyanosis, clubbing, rash, edema. LUE PICC  Neuro: alert & orientedx3, cranial nerves grossly intact. moves all 4 extremities w/o difficulty. Affect pleasant     Telemetry    SR  60s-70s   Labs    CBC Recent Labs (last 2 labs)      Recent Labs    02/16/23 0215 02/17/23 0500  WBC 7.4 7.0  HGB 8.3* 8.6*  HCT 25.3* 26.4*  MCV 96.2 96.4  PLT 469* 495*      Basic Metabolic Panel Recent Labs (last 2 labs)      Recent Labs    02/17/23 0500 02/18/23 0547  NA 133* 134*  K 4.1 4.5  CL 95* 95*  CO2 29 30  GLUCOSE 115* 112*  BUN 18 17  CREATININE 0.78 0.88  CALCIUM 9.0 9.3  MG  --  2.2  PHOS 4.6 4.9*      Liver Function Tests Recent Labs (last 2 labs)      Recent Labs    02/17/23 0500 02/18/23 0547  ALBUMIN 2.7* 3.1*        Recent Labs (last 2 labs)  No results for input(s): "LIPASE", "AMYLASE" in the last 72 hours.   Cardiac Enzymes Recent Labs (last 2 labs)  No results for input(s): "CKTOTAL", "CKMB", "CKMBINDEX", "TROPONINI" in the last 72 hours.     BNP: BNP (last 3 results) Recent Labs (within last 365 days)  Recent Labs    01/23/23 2208 02/14/23 0346  BNP 462.3* 418.7*      ProBNP (last 3 results) Recent Labs (within last 365 days)  No results for input(s): "PROBNP" in the last 8760 hours.     D-Dimer Recent Labs (last 2 labs)  No results for input(s): "DDIMER" in the last 72 hours.   Hemoglobin A1C Recent Labs (last 2 labs)  No results for input(s): "HGBA1C" in the last 72 hours.     Fasting Lipid Panel Recent Labs (last 2 labs)  No results for input(s): "CHOL", "HDL", "LDLCALC", "TRIG", "CHOLHDL", "LDLDIRECT" in the last 72 hours.     Thyroid Function Tests  Recent Labs (last 2 labs)  No results for input(s): "TSH", "T4TOTAL", "T3FREE", "THYROIDAB" in the last 72 hours.   Invalid input(s): "FREET3"     Other results:   Imaging      VAS [place] UPPER EXTREMITY VENOUS DUPLEX   Result Date: 02/17/2023 UPPER VENOUS STUDY  Patient Name:  MASHIYA SICAIROS  Date of Exam:   02/17/2023 Medical Rec #: [id number]            Accession #:    [id number] Date of Birth: 10/23/1955            Patient Gender: F  Patient Age:   69 years Exam Location:  [facility] Procedure:      VAS [place] UPPER EXTREMITY VENOUS DUPLEX Referring Phys: Lillia Abed [facility] --------------------------------------------------------------------------------  Indications: Left forearm pain, indwelling PICC, recent examination positive for acute IJV DVT and cephalic SVT Limitations: Bandages. PICC line. Comparison Study: 02-10-2023 Left upper extremity venous study positive for                   acute DVT involving the IJV and SVT involving the cephalic                   vein. Performing Technologist: Jean Rosenthal RDMS, RVT  Examination Guidelines: A complete evaluation includes B-mode imaging, spectral Doppler, color Doppler, and power Doppler as needed of all accessible portions of each vessel. Bilateral testing is considered an integral part of a complete examination. Limited examinations for reoccurring indications may be performed as noted.  Right Findings: +----------+------------+---------+-----------+----------+-------+ RIGHT     CompressiblePhasicitySpontaneousPropertiesSummary +----------+------------+---------+-----------+----------+-------+ Subclavian               Yes       Yes                      +----------+------------+---------+-----------+----------+-------+  Left Findings: +----------+------------+---------+-----------+-----------------+--------------+ LEFT      CompressiblePhasicitySpontaneous   Properties       Summary     +----------+------------+---------+-----------+-----------------+--------------+ IJV           Full       Yes       Yes                                    +----------+------------+---------+-----------+-----------------+--------------+ Subclavian               Yes       Yes                                    +----------+------------+---------+-----------+-----------------+--------------+ Axillary      Full       Yes  Yes                                     +----------+------------+---------+-----------+-----------------+--------------+ Brachial      Full                        High bifurcation,                                                              Limited                    +----------+------------+---------+-----------+-----------------+--------------+ Radial        Full                                                        +----------+------------+---------+-----------+-----------------+--------------+ Ulnar         Full                                                        +----------+------------+---------+-----------+-----------------+--------------+ Cephalic    Partial      No        No                           Age                                                                  Indeterminate  +----------+------------+---------+-----------+-----------------+--------------+ Basilic       Full                             Limited                    +----------+------------+---------+-----------+-----------------+--------------+  Summary:  Right: No evidence of thrombosis in the subclavian.  Left: No evidence of deep vein thrombosis in the upper extremity. Findings consistent with age indeterminate superficial vein thrombosis involving the left cephalic vein- essentially unchanged as compared to previous examination. Previously visualized internal jugular vein DVT appears resolved on today's examination.  *See table(s) above for measurements and observations.  Diagnosing physician: Sherald Hess MD Electronically signed by Sherald Hess MD on 02/17/2023 at 1:14:30 PM.    Final        Medications:   Scheduled Medications:  acetaminophen  650 mg Oral Q6H   ALPRAZolam  3 mg Oral BID   amiodarone  200 mg Oral BID   apixaban  5 mg Oral BID   aspirin  81 mg Oral Daily   busPIRone  10 mg Oral BID   Chlorhexidine Gluconate  Cloth  6 each Topical Daily   clopidogrel  75 mg Oral Daily    dapagliflozin propanediol  10 mg Oral Daily   docusate sodium  100 mg Oral BID   feeding supplement  237 mL Oral BID BM   FLUoxetine  20 mg Oral QHS   gabapentin  300 mg Oral TID   lidocaine  1 patch Transdermal Q24H   melatonin  3 mg Oral Daily   methocarbamol  500 mg Oral BID   polyethylene glycol  17 g Oral BID   rosuvastatin  20 mg Oral Daily   sacubitril-valsartan  1 tablet Oral BID   senna-docusate  1 tablet Oral BID   sodium chloride flush  10-40 mL Intracatheter Q12H   sodium chloride flush  3 mL Intravenous Q12H   sorbitol  30 mL Oral Once   spironolactone  25 mg Oral Daily          Infusions:  sodium chloride     sodium chloride Stopped (02/08/23 0416)   sodium chloride     sodium chloride     sodium chloride            PRN Medications:  sodium chloride, sodium chloride, Place/Maintain arterial line **AND** sodium chloride, ALPRAZolam, alum & mag hydroxide-simeth, bisacodyl, calcium carbonate, HYDROmorphone (DILAUDID) injection, hydrOXYzine, ipratropium-albuterol, lidocaine, lidocaine, nitroGLYCERIN, ondansetron (ZOFRAN) IV, mouth rinse, mouth rinse, mouth rinse, oxyCODONE, sodium chloride flush, sodium chloride flush       Patient Profile  67 y.o. female with history of CAD s/p PCI to RCA in 2021, carotid artery stenosis s/p R CEA, aortic valve stenosis. Admitted with unstable angina. Found to have severe LAD stenosis s/p PCI and severe low flow low gradient AS. Coarse c/b refractory VT and hemodynamic instability >> cardiogenic shock. Assessment/Plan  Acute systolic CHF >> cardiogenic shock - In setting of severe ischemic heart disease, reduced EF, severe AS and refractory VT/VF - Echo 01/25/23: EF 30-35%, septum and apex HK, RV low normal, low flow low graident severe AS with mean gradient 20 mmHg, AVA 0.93 cm2, DI 0.33 - echo 02/01/23: EF 20-25%, RV okay, severe low flow low gradient severe AS with mean gradient 25 mmHg, AVA 1.1 cm2 - s/p PCI/stent to LAD.  Stent thrombosis on 08/09 treated with thrombectomy.  - IABP placed 08/13. Had considered [facility] ECMO but hemodynamics improved with inotrope support - S/p TAVR 8/15 - Echo 8/16 with EF 35-40%, mild LVH, WMAs, RV normal, bioprosthetic aortic valve s/p TAVR with mean gradient 13 mmH, trivial PVL, IVC mildly dilated.  - IABP removed 8/17.  Stable off milrinone.  -Volume status stable.   - Continue lower dose of Entresto 49/51 mg BID - Continue spiro 25 mg daily - Continue farxiga  - No BB for now with low output   2. Refractory VT - S/p multiple shocks.  - No further VT - continue amio 200 mg twice a day.  K stable.    3. CAD NSTEMI -HX PCI to RCA in 2021 -Presented with unstable angina. S/p PCI/stent to LAD on 08/07. Stent thrombosis 08/09 treated with thrombectomy -HS troponin > 24K on 08/12 -LHC 08/12 with patent LAD stent -LAD appeared widely patent on TAVR angiogram - No chest pain.  - Remains on DAPT with ASA 81 and Plavix for LAD PCI. Also on apixaban for left internal jugular DVT. Would continue triple therapy for now given history of stent thrombosis. After 1 month post-PCI, think we can drop ASA.    4.  LFLG Severe AS -Noted on echo this admit -Not candidate for SAVR -s/p TAVR 8/15, valve stable on 8/15 echo with trivial PVL and low gradient.    5. Carotid artery stenosis -S/p R CEA in 2020   6. ID -Developed sepsis 08/13. PNA Completed antibiotics.    7. Respiratory failure - Extubated 8/18.  - Completed antibiotic course for pneumonia.  - Sats stable on 2 liters [place].  - Needs ongoing pulmonary toilet.    8. Hyponatremia -suspected hypervolemic hyponatremia -improved to 134 today - restrict free water   9. DVT left internal jugular - Continue eliquis, reducing to 5 mg BID today - Repeat UE venous dopplers showed resolved LIJ DVT, still present superficial cephalic vein thrombosis.    10. Anemia  Recent CBC stale.  -No obvious source of bleeding.    11.  Left forearm swelling/tenderness --UE venous dopplers showed resolved LIJ DVT, still present superficial cephalic vein thrombosis.     Okay for discharge to CIR from HF standpoint.    Tonye Becket, NP  9:47 AM   Agree with the above NP note.    Patient stable for SNF. Continue current HF meds, she does not appear to need Lasix at this time. Think that aspirin 81 can be dropped 1 month post-PCI as she will continue on Plavix and Eliquis.    Marca Ancona 02/18/2023

## 2023-02-18 NOTE — Progress Notes (Signed)
Signed     Expand All Collapse All PMR Admission Coordinator Pre-Admission Assessment   Patient: Madison West is an 67 y.o., female MRN: [id number] DOB: 03-20-1956 Height: 5\' 8"  (172.7 cm) Weight: 102.4 kg   Insurance Information HMO:     PPO:      PCP:      IPA:      80/20: ye     OTHER:  PRIMARY: Medicare Parts A and B       Policy#: [id number]   Subscriber:  Phone#: Verified online    Fax#:  Pre-Cert#:       Employer:  Benefits:  Phone #:      Name:  Eff. Date: Parts A  and B effective 08/21/18 Deduct: $1632      Out of Pocket Max:  None      Life Max: N/A  CIR: 100%      SNF: 100 days Outpatient: 80%     Co-Pay: 20% Home Health: 100%      Co-Pay: none DME: 80%     Co-Pay: 20% Providers: [facility]'s [facility]      Policy#: [id number]      Phone#:    Financial Counselor:       Phone#:    The "Data Collection Information Summary" for patients in Inpatient Rehabilitation Facilities with attached "Privacy Act Statement-Health Care Records" was provided and verbally reviewed with: Patient   Emergency Contact Information Contact Information       Name Relation Home Work Mobile    [place] Daughter [phone]   [phone]         Other Contacts       Name Relation Home Work Mobile    [place] Daughter [phone]   [phone]           Current Medical History  Patient Admitting Diagnosis: [facility] arrest Cardiac Debility s/p TAVR History of Present Illness: [name], a 67 y/o with a medical  history of PSVT, anxiety/depression, breast cancer status post lumpectomy, chemo and radiation in 2009, hypertension, hyperlipidemia, right carotid artery stenosis status post carotid endarterectomy in 2020, CAD status post stenting, mild to moderate aortic stenosis, GERD presented to the [facility]  ED 02/03/23 with complaints of shortness of breath, weight gain, and generalized weakness.  Slightly tachycardic on arrival but  afebrile.  Not hypotensive.  Satting in the low 90s on room air.  Labs significant for sodium 128, chloride 91, creatinine 0.7, BNP 462, troponin 35> 38, COVID/influenza/RSV PCR negative. In the ED, labs, vitals, and imaging as follows: T 99 132/88 HR 87 RR 16. Na 128, BNP 462, Troponin 35 to 38, CBCD nl. CTA chest - no PE, bronchial wall thickening, emphysema, CAD, no pulmonary edema. CXR - NAD, EKG sinust tack PVC, LAE, LAFB minimal ST elevation. Pt. Admitted for management of CHF. Pt. Underwent LHC 8/11 with mid LAD 95% lesion, stent placed. VTACH arrest x2 on 8/13 requiring CPR and multiple shocks for ROSC; found to have NSTEMI. S/p IABP placement 8/13-8/17. S/p TAVR 8/15. ETT 8/13-8/18. Pt. Seen by PT/OT and they recommend CIR to assist return to PLOF.    Patient's medical record from [facility]  has been reviewed by the rehabilitation admission coordinator and physician.   Past Medical History      Past Medical History:  Diagnosis Date   Anxiety     Arthritis      low back and hip pain intermittent   Breast cancer (HCC)  06/02/07    r breast -surgery ,radiaology. chemotherapy   Carpal tunnel syndrome      right hand   Colon polyps     Complication of anesthesia      Fentanyl, Versed-makes extra hyper, bradycardia x 1 in PACU, [facility] (08/15/11 cardiology felt neostigmine may have resulted in AV nodal block)    Coronary artery disease     Depression      denies   Dysplasia of vulva     Hypertension     Palpitations      PSVT, s/p adenosine 08/04/16   S/P breast lumpectomy 07/04/07    R breast   S/P radiation therapy 2009          Has the patient had major surgery during 100 days prior to admission? Yes   Family History   family history includes Colon cancer (age of onset: 46) in her mother; Colon polyps in her brother; Diabetes type II in her brother; Heart disease in her father and paternal grandfather.   Current Medications  Current Medications    Current  Facility-Administered Medications:    0.9 %  sodium chloride infusion, 250 mL, Intravenous, PRN, Janetta Hora, PA-C   0.9 %  sodium chloride infusion, , Intravenous, Continuous, Janetta Hora, PA-C, Stopped at 02/08/23 0416   0.9 %  sodium chloride infusion, 250 mL, Intravenous, PRN, Janetta Hora, PA-C   Place/Maintain arterial line, , , Until Discontinued **AND** 0.9 %  sodium chloride infusion, , Intra-arterial, PRN, Janetta Hora, PA-C   acetaminophen (TYLENOL) tablet 650 mg, 650 mg, Oral, Q6H, Agarwala, Ravi, MD, 650 mg at 02/15/23 0908   ALPRAZolam Prudy Feeler) tablet 3 mg, 3 mg, Oral, TID PRN, Lorin Glass, MD, 3 mg at 02/15/23 0956   alum & mag hydroxide-simeth (MAALOX/MYLANTA) 200-200-20 MG/5ML suspension 30 mL, 30 mL, Oral, Q6H PRN, Janetta Hora, PA-C, 30 mL at 02/01/23 9604   amiodarone (PACERONE) tablet 200 mg, 200 mg, Oral, BID, Alen Bleacher, NP   apixaban (ELIQUIS) tablet 10 mg, 10 mg, Oral, BID, 10 mg at 02/15/23 0909 **FOLLOWED BY** [START ON 02/17/2023] apixaban (ELIQUIS) tablet 5 mg, 5 mg, Oral, BID, Lorin Glass, MD   aspirin chewable tablet 81 mg, 81 mg, Oral, Daily, Icard, Bradley L, DO, 81 mg at 02/15/23 5409   bisacodyl (DULCOLAX) suppository 10 mg, 10 mg, Rectal, Daily PRN, Icard, Bradley L, DO   busPIRone (BUSPAR) tablet 10 mg, 10 mg, Oral, BID, Lorin Glass, MD, 10 mg at 02/15/23 8119   calcium carbonate (TUMS - dosed in mg elemental calcium) chewable tablet 200 mg of elemental calcium, 1 tablet, Oral, TID PRN, Janetta Hora, PA-C   Chlorhexidine Gluconate Cloth 2 % PADS 6 each, 6 each, Topical, Daily, Janetta Hora, PA-C, 6 each at 02/15/23 0913   clopidogrel (PLAVIX) tablet 75 mg, 75 mg, Oral, Daily, Lorin Glass, MD, 75 mg at 02/15/23 1478   docusate sodium (COLACE) capsule 100 mg, 100 mg, Oral, BID, Lorin Glass, MD, 100 mg at 02/15/23 0908   feeding supplement (ENSURE ENLIVE / ENSURE PLUS) liquid 237 mL, 237 mL,  Oral, BID BM, Lorin Glass, MD, 237 mL at 02/14/23 0908   FLUoxetine (PROZAC) capsule 20 mg, 20 mg, Oral, QHS, Lorin Glass, MD, 20 mg at 02/13/23 2121   furosemide (LASIX) tablet 40 mg, 40 mg, Oral, Daily, Brynda Peon L, NP, 40 mg at 02/15/23 1033   gabapentin (NEURONTIN) capsule 300 mg,  300 mg, Oral, TID, Lorin Glass, MD, 300 mg at 02/15/23 7829   hydrALAZINE (APRESOLINE) injection 10 mg, 10 mg, Intravenous, Q2H PRN, Laurey Morale, MD, 10 mg at 02/11/23 0840   HYDROmorphone (DILAUDID) injection 1 mg, 1 mg, Intravenous, Q3H PRN, Lorin Glass, MD, 1 mg at 02/14/23 2226   hydrOXYzine (ATARAX) tablet 10 mg, 10 mg, Oral, TID PRN, Lanier Clam, NP, 10 mg at 02/13/23 2123   insulin aspart (novoLOG) injection 2-6 Units, 2-6 Units, Subcutaneous, TID WC, Agarwala, Ravi, MD   ipratropium-albuterol (DUONEB) 0.5-2.5 (3) MG/3ML nebulizer solution 3 mL, 3 mL, Nebulization, Q6H PRN, Bowser, Kaylyn Layer, NP   lidocaine (LIDODERM) 5 % 2 patch, 2 patch, Transdermal, Q12H PRN, Lynnell Catalan, MD, 2 patch at 02/14/23 0907   lidocaine (XYLOCAINE) 2 % viscous mouth solution 15 mL, 15 mL, Mouth/Throat, Q3H PRN, Janetta Hora, PA-C   melatonin tablet 3 mg, 3 mg, Oral, Daily, Bowser, Kaylyn Layer, NP, 3 mg at 02/14/23 2234   methocarbamol (ROBAXIN) tablet 500 mg, 500 mg, Oral, BID, Lorin Glass, MD, 500 mg at 02/15/23 5621   nitroGLYCERIN (NITROSTAT) SL tablet 0.4 mg, 0.4 mg, Sublingual, Q5 min PRN, Janetta Hora, PA-C   ondansetron [facility]) injection 4 mg, 4 mg, Intravenous, Q6H PRN, Janetta Hora, PA-C   Oral care mouth rinse, 15 mL, Mouth Rinse, PRN, Icard, Rachel Bo, DO   Oral care mouth rinse, 15 mL, Mouth Rinse, PRN, Lorin Glass, MD   Oral care mouth rinse, 15 mL, Mouth Rinse, PRN, Lynnell Catalan, MD   oxyCODONE (Oxy IR/ROXICODONE) immediate release tablet 5 mg, 5 mg, Oral, Q4H PRN, Janetta Hora, PA-C, 5 mg at 02/15/23 0754   polyethylene glycol (MIRALAX / GLYCOLAX) packet  17 g, 17 g, Oral, BID, Icard, Bradley L, DO, 17 g at 02/15/23 0907   rosuvastatin (CRESTOR) tablet 20 mg, 20 mg, Oral, Daily, Icard, Bradley L, DO, 20 mg at 02/15/23 0908   sacubitril-valsartan (ENTRESTO) 97-103 mg per tablet, 1 tablet, Oral, BID, Alen Bleacher, NP   senna-docusate (Senokot-S) tablet 1 tablet, 1 tablet, Oral, BID, Lorin Glass, MD, 1 tablet at 02/15/23 3086   sodium chloride 0.9 % bolus 250 mL, 250 mL, Intravenous, Once, Cline Crock R, PA-C   sodium chloride flush (NS) 0.9 % injection 10-40 mL, 10-40 mL, Intracatheter, Q12H, Lorin Glass, MD, 10 mL at 02/15/23 0909   sodium chloride flush (NS) 0.9 % injection 10-40 mL, 10-40 mL, Intracatheter, PRN, Lorin Glass, MD   sodium chloride flush (NS) 0.9 % injection 3 mL, 3 mL, Intravenous, Q12H, Janetta Hora, PA-C, 3 mL at 02/15/23 0913   sodium chloride flush (NS) 0.9 % injection 3 mL, 3 mL, Intravenous, PRN, Janetta Hora, PA-C   sorbitol 70 % solution 30 mL, 30 mL, Oral, Once, Lorin Glass, MD   spironolactone (ALDACTONE) tablet 25 mg, 25 mg, Oral, Daily, Bensimhon, Bevelyn Buckles, MD, 25 mg at 02/15/23 0909     Patients Current Diet:  Diet Order                  Diet Heart Room service appropriate? Yes with Assist; Fluid consistency: Thin; Fluid restriction: 1200 mL Fluid  Diet effective now                         Precautions / Restrictions Precautions Precautions: Fall Precaution Comments: multiple lines/leads, high anxiety Restrictions Weight Bearing Restrictions:  No RLE Weight Bearing: Weight bearing as tolerated    Has the patient had 2 or more falls or a fall with injury in the past year? No   Prior Activity Level Community (5-7x/wk): Pt. active in the community PTA   Prior Functional Level Self Care: Did the patient need help bathing, dressing, using the toilet or eating? Independent   Indoor Mobility: Did the patient need assistance with walking from room to room (with or  without device)? Independent   Stairs: Did the patient need assistance with internal or external stairs (with or without device)? Independent   Functional Cognition: Did the patient need help planning regular tasks such as shopping or remembering to take medications? Independent   Patient Information Are you of Hispanic, Latino/a,or Spanish origin?: A. No, not of Hispanic, Latino/a, or Spanish origin What is your race?: A. White Do you need or want an interpreter to communicate with a doctor or health care staff?: 0. No   Patient's Response To:  Health Literacy and Transportation Is the patient able to respond to health literacy and transportation needs?: Yes Health Literacy - How often do you need to have someone help you when you read instructions, pamphlets, or other written material from your doctor or pharmacy?: Never In the past 12 months, has lack of transportation kept you from medical appointments or from getting medications?: No In the past 12 months, has lack of transportation kept you from meetings, work, or from getting things needed for daily living?: No   Journalist, newspaper / Equipment Home Assistive Devices/Equipment: None Home Equipment: None   Prior Device Use: Indicate devices/aids used by the patient prior to current illness, exacerbation or injury? None of the above   Current Functional Level Cognition   Overall Cognitive Status: Within Functional Limits for tasks assessed Orientation Level: Oriented X4 General Comments: pt with known high anxiety and is on xanax which was started yesterday which inhibits the ability to progress mobility, pt was able to follow commands with increased time and answer questions appropriately    Extremity Assessment (includes Sensation/Coordination)   Upper Extremity Assessment: Generalized weakness  Lower Extremity Assessment: Defer to PT evaluation     ADLs   Overall ADL's : Needs assistance/impaired Eating/Feeding:  NPO Grooming: Set up, Sitting Grooming Details (indicate cue type and reason): wiping face Upper Body Bathing: Moderate assistance, Bed level Lower Body Bathing: Maximal assistance, Bed level Upper Body Dressing : Moderate assistance, Bed level Lower Body Dressing: Maximal assistance, Bed level Toilet Transfer: Maximal assistance Toileting- Clothing Manipulation and Hygiene: Total assistance Functional mobility during ADLs: Maximal assistance     Mobility   Overal bed mobility: Needs Assistance Bed Mobility: Supine to Sit Rolling: Max assist Supine to sit: Max assist General bed mobility comments: up in recliner     Transfers   Overall transfer level: Needs assistance Equipment used: Rolling walker (2 wheels) Transfers: Sit to/from Stand Sit to Stand: Mod assist Bed to/from chair/wheelchair/BSC transfer type:: Step pivot Step pivot transfers: Min assist, +2 physical assistance, +2 safety/equipment General transfer comment: some lifting help, pt using arms, but with pain in chest     Ambulation / Gait / Stairs / Wheelchair Mobility   Ambulation/Gait Ambulation/Gait assistance: Min assist, +2 safety/equipment Gait Distance (Feet): 45 Feet (&15') Assistive device: Rolling walker (2 wheels) Gait Pattern/deviations: Step-to pattern, Decreased stride length, Trunk flexed, Shuffle General Gait Details: very slow and difficulty picking up her legs stating she feels so heavy and like she is  67 y/o; seated rest, on 100% NRB with ambulation     Posture / Balance Dynamic Sitting Balance Sitting balance - Comments: on toilet in bathroom Balance Overall balance assessment: Needs assistance Sitting-balance support: Feet supported Sitting balance-Leahy Scale: Good Sitting balance - Comments: on toilet in bathroom Standing balance support: Bilateral upper extremity supported Standing balance-Leahy Scale: Poor Standing balance comment: UE support and assist for balance     Special  needs/care consideration Skin surgical incision and Special service needs sternal precautions    Previous Home Environment (from acute therapy documentation) Living Arrangements: Children  Lives With: Spouse Available Help at Discharge: Family, Available PRN/intermittently Type of Home: House Home Layout: One level Home Access: Stairs to enter Entrance Stairs-Rails: Can reach both Entrance Stairs-Number of Steps: 2 Bathroom Shower/Tub: Tub/shower unit, Health visitor: Administrator Accessibility: Yes How Accessible: Accessible via walker, Accessible via wheelchair Home Care Services: No   Discharge Living Setting Plans for Discharge Living Setting: Patient's home Type of Home at Discharge: House Discharge Home Layout: One level Discharge Home Access: Stairs to enter Entrance Stairs-Rails: None Entrance Stairs-Number of Steps: 2 Discharge Bathroom Shower/Tub: Walk-in shower Discharge Bathroom Toilet: Handicapped height Discharge Bathroom Accessibility: No How Accessible: Accessible via walker, Accessible via wheelchair Does the patient have any problems obtaining your medications?: No   Social/Family/Support Systems Patient Roles: Other (Comment)   Goals Patient/Family Goal for Rehab: PT/OT Mod I Expected length of stay: 7-10 days Pt/Family Agrees to Admission and willing to participate: Yes Program Orientation Provided & Reviewed with Pt/Caregiver Including Roles  & Responsibilities: Yes   Decrease burden of Care through IP rehab admission: not anticipated    Possible need for SNF placement upon discharge: not anticipated   Patient Condition: I have reviewed medical records from [facility], spoken with CM, and patient and family member. I met with patient at the bedside for inpatient rehabilitation assessment.  Patient will benefit from ongoing PT and OT, can actively participate in 3 hours of therapy a day 5 days of the week, and can  make measurable gains during the admission.  Patient will also benefit from the coordinated team approach during an Inpatient Acute Rehabilitation admission.  The patient will receive intensive therapy as well as Rehabilitation physician, nursing, social worker, and care management interventions.  Due to safety, skin/wound care, disease management, medication administration, pain management, and patient education the patient requires 24 hour a day rehabilitation nursing.  The patient is currently Contact G-min A with mobility and basic ADLs.  Discharge setting and therapy post discharge at home with home health is anticipated.  Patient has agreed to participate in the Acute Inpatient Rehabilitation Program and will admit today.   Preadmission Screen Completed By:  Jeronimo Greaves, 02/15/2023 11:53 AM ______________________________________________________________________   Discussed status with Dr. Natale Lay on 02/15/23 at 1200 and received approval for admission today.   Admission Coordinator:  Jeronimo Greaves, CCC-SLP, time 1153/Date 02/15/23    Assessment/Plan: Diagnosis: Debility secondary to acute on chronic CHF   Does the need for close, 24 hr/day Medical supervision in concert with the patient's rehab needs make it unreasonable for this patient to be served in a less intensive setting? Yes Co-Morbidities requiring supervision/potential complications: acute on chronic CHF c/b cardiogenic shock, refractive ventricular tachycardia, pulmonary edema w/ respiratory failure, NSTEMI s/p LAD stent 8/7, pneumonia, hypertension, left internal jugular DVT, anemia of chrtonic disease, hyponatremia, urinary retention, and anxiety   Due to bladder management, safety, skin/wound care,  disease management, medication administration, pain management, and patient education, does the patient require 24 hr/day rehab nursing? Yes Does the patient require coordinated care of a physician, rehab nurse, PT, OT, and SLP to  address physical and functional deficits in the context of the above medical diagnosis(es)? Yes Addressing deficits in the following areas: balance, endurance, locomotion, strength, transferring, bowel/bladder control, bathing, dressing, feeding, grooming, and toileting Can the patient actively participate in an intensive therapy program of at least 3 hrs of therapy 5 days a week? Yes The potential for patient to make measurable gains while on inpatient rehab is good Anticipated functional outcomes upon discharge from inpatient rehab: modified independent PT, modified independent OT Estimated rehab length of stay to reach the above functional goals is: 14-16 days Anticipated discharge destination: Home 10. Overall Rehab/Functional Prognosis: good     MD Signature:   Angelina Sheriff, DO 02/18/2023

## 2023-02-18 NOTE — H&P (Signed)
Physical Medicine and Rehabilitation Admission H&P     CC: Functional deficits secondary to CAD/CHF s/p TAVR   HPI: Madison West is a 67 year old female who follows with Dr. Tacey Ruiz Cardiology with a history of AS, CAD s/p PCI in 2021 who presented to the [facility] ED on 01/23/2023 complaining ongoing shortness of breath. Her BNP was elevated along with mild elevation in her troponins. No hypoxemia but some increased work of breathing with exertion. Transferred to [facility] telemetry unit and admitted to hospitalist service. CT angiogram of chest showed no evidence of pulmonary embolism but mild bronchial wall thickening.  EKG showed PVC, LAE, LAFB minimal ST elevation. Continued supportive care with IV Lasix, intake and output charting daily weights. Echo updated>>now with depressed LV function and mean gradient of aortic valve of and evidence for low flow low gradient AS with calcified valve visually. Cardiology consulted on 8/07 and recommended heart cath. Given the cardiomyopathy and HFrEF, discussed the importance of guideline directed medical therapy.  She appeared to be apprehensive but willing to uptitrate medical therapy given the clinical findings.  Discontinued amlodipine 2.5 mg p.o. daily.  Continued losartan. Started spironolactone 25 mg p.o. daily.  Cardiothoracic surgery consulted on 8/08: Per Dr. Leafy Ro, NYHA class 2-3 symptoms of ischemic cardiomyopathy complicated with low flow low gradient AS. Depressed LV function with EF 30-35% and with comorbidities that would make SAVR/CABG higher risk. He felt that TAVR work up to determine if TAVR anatomically feasable would be best management for her AS and to PCI LAD lesion. Patient underwent CT angiogram of the chest, CT coronaries and CT angio of the abdomen and pelvis prior to deciding on further course of treatment. HFrEF likely a combination of valvular and ischemic cardiomyopathy. She experienced episodes of sinus tachycardia  at rest and exertion, and brief episodes of NSVT. On 8/11, she developed ventricular tachycardia with no pulse. CPR intiated; ACLS protocol with epi and defibrillated times two with ROSC. V-tach once again, shock delivered and given magnesium and calcium. Patient started on amio drip. Patient transferred to [facility] ICU and cardiology consulted. EKG showing ST elevation in anterior leads. Code STEMI. PCCM consulted. Acute stent thrombosis.  balloon up to 20 atm with restoration of TIMI-3 flow. Dr. Rosemary Holms did not think additional stent was necessary in absence of obvious stent edge dissection, and increased risk of stent thrombosis with overlapping stents. ST elevation resolved on EKG during the procedure.  EKG today showed limited septal infarct. On the evening of 8/12, she developed Ventricular bigeminy, trigeminy through the evening with episodes of NSVT for which amiodarone was started. At 10:52, she had VT arrest X2, with one she lost pulse, was successfully defibrillated. Had brief pause after defibrillation but returned to sinus rhythm with ROSC. She has no new chest pain at this time. 12 lead EKG does not show new ST elevation, show incomplete LBBB and age indeterminate septal infarct. However, given her stent thrombosis yesterday, low EF, severe AS, Dr. Rosemary Holms recommended re-look angiography, along with RHC to assess invasive hemodynamics. Remained intubated for airway protection. Discussed with Dr. Gala Romney and IABP placed. Dr. Lynnette Caffey consulted for placement on VA ECMO support and potential TAVR. She underwent procedure on 8/15.  Extubated on 8/18. PICC placed 8/19. Completed antibiotics for pneumonia on 8/20. 8/21 urology consulted secondary to difficult Foley catheter placement. History noted of multiple partial vulvectomy in the past. Acute DVT in left internal jugular along with acute superficial vein thrombosis involving the left cephalic  vein, proximal upper arm to mid forearm and started on  apixaban. Heart failure team adjusting medications, diuresing. Off all vasoactive medications and O2 requirements down to 4 L/min and transferred to PCU on 8/25. Noted hypotensive overnight 8/27 and required albumin. Heart failure team felt likely over diuresis. Holding Lasix and checked orthostatics. CBC and lactic acid obtained. LA + 0.6. Entresto reduced to 49-51. Continued spironolactone 25 mg daily. Foley removed 8/28. LUE venous duplex repeated on 8/28: Findings consistent with age indeterminate superficial vein thrombosis involving the left cephalic vein-essentially unchanged as compared to previous examination. Previously visualized internal jugular vein DVT appears resolved on this examination. Plan to stay off of Lasix and begin Fraxiga 10 mg daily. On apixaban for left internal jugular DVT.  HF team plans to continue triple therapy for now given history of stent thrombosis.  After 1 month post-PCI, plan to drop ASA. No beta blocker now for low output. Tolerating diet. Patient was able to perform transfers/ambulation around room with increased time, small steps, with CGA and rest breaks. She was able to complete LB dressing with CGA, increased time, pain in chest when reaching/bending down. The patient requires inpatient medicine and rehabilitation evaluations and services for ongoing dysfunction secondary to debility from prolonged hospitalization due to CAD/CHF s/p TAVR.     Soc Hx - widowed after 24 years of marriage. Two daughters, one grandchild. Lives alone. Works part-time as a Leisure centre manager. Helps with horses, but lately has been too fatigued. Full code.    She reports that she quit smoking about 25 years ago. Her smoking use included cigarettes. She started smoking about 45 years ago. She has a 15 pack-year smoking history. She has never used smokeless tobacco. She reports current alcohol use. She reports that she does not use drugs.    Review of Systems  Constitutional:  Negative for chills  and fever.  HENT:  Negative for congestion.   Respiratory:  Positive for cough and shortness of breath.   Cardiovascular:  Positive for chest pain and orthopnea. Negative for palpitations.  Gastrointestinal:  Negative for abdominal pain, constipation, diarrhea, heartburn, nausea and vomiting.  Genitourinary:  Negative for dysuria and urgency.  Musculoskeletal:  Positive for back pain.  Neurological:  Positive for weakness. Negative for dizziness, sensory change and headaches.  Psychiatric/Behavioral:  The patient is nervous/anxious. The patient does not have insomnia.           Past Medical History:  Diagnosis Date   Anxiety     Arthritis      low back and hip pain intermittent   Breast cancer (HCC) 06/02/07    r breast -surgery ,radiaology. chemotherapy   Carpal tunnel syndrome      right hand   Colon polyps     Complication of anesthesia      Fentanyl, Versed-makes extra hyper, bradycardia x 1 in PACU, [facility] (08/15/11 cardiology felt neostigmine may have resulted in AV nodal block)    Coronary artery disease     Depression      denies   Dysplasia of vulva     Hypertension     Palpitations      PSVT, s/p adenosine 08/04/16   S/P breast lumpectomy 07/04/07    R breast   S/P radiation therapy 2009             Past Surgical History:  Procedure Laterality Date   APPENDECTOMY        age 19   CESAREAN SECTION  x 2   COLONOSCOPY W/ POLYPECTOMY       COLONOSCOPY WITH PROPOFOL N/A 04/27/2016    Procedure: COLONOSCOPY WITH PROPOFOL;  Surgeon: Charolett Bumpers, MD;  Location: [facility];  Service: Endoscopy;  Laterality: N/A;   CORONARY STENT INTERVENTION N/A 01/29/2023    Procedure: CORONARY STENT INTERVENTION;  Surgeon: Elder Negus, MD;  Location: [facility]VASIVE CV LAB;  Service: Cardiovascular;  Laterality: N/A;   CORONARY THROMBECTOMY N/A 01/31/2023    Procedure: Coronary Thrombectomy;  Surgeon: Elder Negus, MD;  Location: [facility];  Service:  Cardiovascular;  Laterality: N/A;   CORONARY ULTRASOUND/IVUS N/A 01/31/2023    Procedure: Coronary Ultrasound/IVUS;  Surgeon: Elder Negus, MD;  Location: [facility];  Service: Cardiovascular;  Laterality: N/A;   CORONARY/GRAFT ACUTE MI REVASCULARIZATION N/A 01/31/2023    Procedure: Coronary/Graft Acute MI Revascularization;  Surgeon: Elder Negus, MD;  Location: [facility];  Service: Cardiovascular;  Laterality: N/A;   DILATION AND CURETTAGE OF UTERUS        multiple   ENDARTERECTOMY Right 06/09/2019   ENDARTERECTOMY Right 06/09/2019    Procedure: ENDARTERECTOMY CAROTID RIGHT;  Surgeon: Sherren Kerns, MD;  Location: [facility] OR;  Service: Vascular;  Laterality: Right;   IABP INSERTION N/A 02/02/2023    Procedure: IABP Insertion;  Surgeon: Elder Negus, MD;  [facility]: [facility];  Service: Cardiovascular;  Laterality: N/A;   INTRAUTERINE DEVICE INSERTION       IUD REMOVAL       LEFT HEART CATH AND CORONARY ANGIOGRAPHY N/A 02/27/2020    Procedure: LEFT HEART CATH AND CORONARY ANGIOGRAPHY;  Surgeon: Yates Decamp, MD;  Location: [facility];  Service: Cardiovascular;  Laterality: N/A;   LEFT HEART CATH AND CORONARY ANGIOGRAPHY N/A 01/31/2023    Procedure: LEFT HEART CATH AND CORONARY ANGIOGRAPHY;  Surgeon: Elder Negus, MD;  [facility]: [facility];  Service: Cardiovascular;  Laterality: N/A;   MASTECTOMY PARTIAL / LUMPECTOMY W/ AXILLARY LYMPHADENECTOMY Right      lumpectomy and lymph nodes removed   PATCH ANGIOPLASTY Right 06/09/2019    Procedure: Patch Angioplasty of right carotid artery using hemashield paltinum finesse patch;  Surgeon: Sherren Kerns, MD;  Location: [facility] OR;  Service: Vascular;  Laterality: Right;   RIGHT AND LEFT HEART CATH N/A 02/01/2023    Procedure: RIGHT AND LEFT HEART CATH;  Surgeon: Elder Negus, MD;  [facility];  Service: Cardiovascular;  Laterality: N/A;   RIGHT HEART CATH N/A  02/02/2023    Procedure: RIGHT HEART CATH;  Surgeon: Dolores Patty, MD;  [facility]: [facility];  Service: Cardiovascular;  Laterality: N/A;   RIGHT/LEFT HEART CATH AND CORONARY ANGIOGRAPHY N/A 01/27/2023    Procedure: RIGHT/LEFT HEART CATH AND CORONARY ANGIOGRAPHY;  Surgeon: Elder Negus, MD;  [facility];  Service: Cardiovascular;  Laterality: N/A;   TEE WITHOUT CARDIOVERSION N/A 02/04/2023    Procedure: TRANSESOPHAGEAL ECHOCARDIOGRAM;  Surgeon: Orbie Pyo, MD;  Location: [facility] INVASIVE CV LAB;  Service: Open Heart Surgery;  Laterality: N/A;   TEMPORARY PACEMAKER N/A 02/02/2023    Procedure: TEMPORARY PACEMAKER;  Surgeon: Dolores Patty, MD;  [facility];  Service: Cardiovascular;  Laterality: N/A;   TRANSCATHETER AORTIC VALVE REPLACEMENT, TRANSFEMORAL N/A 02/04/2023    Procedure: Transcatheter Aortic Valve Replacement, Transfemoral;  Surgeon: Orbie Pyo, MD;  Location: [facility];  Service: Open Heart Surgery;  Laterality: N/A;   TUBAL LIGATION       VULVA SURGERY        Multiple times for dysplasia             Family History  Problem Relation Age of Onset   Colon cancer Mother 62   Heart disease Father          heart attack   Colon polyps Brother          one brother had large colon polyp that needed surgery to be removed   Heart disease Paternal Grandfather     Diabetes type II Brother          Social History:  reports that she quit smoking about 25 years ago. Her smoking use included cigarettes. She started smoking about 45 years ago. She has a 15 pack-year smoking history. She has never used smokeless tobacco. She reports current alcohol use. She reports that she does not use drugs. Allergies:  Allergies       Allergies  Allergen Reactions   Prednisone Palpitations   Fentanyl Swelling and Other (See Comments)      Makes pt "hyper" feels she is "climbing the walls."  Last time she had a colonoscopy with  Fentanyl and Versed she was awake the whole time.   Versed [Midazolam] Swelling and Anxiety      Makes pt "hyper" feels she is "climbing the walls."  Last time she had a colonoscopy with Fentanyl and Versed she was awake the whole time.   Vicodin [Hydrocodone-Acetaminophen] Anxiety      Extreme anxiety.   OK to take oxycodone.            Medications Prior to Admission  Medication Sig Dispense Refill   acetaminophen (TYLENOL) 500 MG tablet Take 1,000 mg by mouth every 6 (six) hours as needed for fever or mild pain.       alprazolam (XANAX) 2 MG tablet Take 0.25-2 mg by mouth See admin instructions. 1 mg (may take up to 2 mg) twice daily. May also take 0.25 mg during the day if needed for anxiety.       Ascorbic Acid (VITAMIN C PO) Take 2 tablets by mouth daily.       aspirin 81 MG chewable tablet Chew 81 mg by mouth daily.        Cholecalciferol (VITAMIN D3 PO) Take 1 capsule by mouth daily.       Coenzyme Q10 (CO Q 10 PO) Take 1 capsule by mouth daily.       GARLIC PO Take 1 capsule by mouth daily.       losartan-hydrochlorothiazide (HYZAAR) 50-12.5 MG tablet Take 1 tablet by mouth daily. (Patient taking differently: Take 0.75 tablets by mouth daily.) 10 tablet 0   Multiple Vitamins-Minerals (ZINC PO) Take 1 tablet by mouth daily.       nitroGLYCERIN (NITROSTAT) 0.4 MG SL tablet Place 1 tablet (0.4 mg total) under the tongue every 5 (five) minutes as needed for chest pain. 25 tablet 2   Omega-3 Fatty Acids (FISH OIL PO) Take 1 capsule by mouth daily.       TURMERIC CURCUMIN PO Take 1 tablet by mouth daily.                  Home: Home Living Family/patient expects to be discharged to:: Private residence Living Arrangements: Children Available Help at Discharge: Family, Available PRN/intermittently Type of Home: House Home Access: Stairs to enter [employer] of Steps:  2 Entrance Stairs-Rails: Can reach both Home Layout: One level Bathroom Shower/Tub: Tub/shower unit,  Health visitor: Standard Bathroom Accessibility: Yes Home Equipment: None  Lives With: Spouse   Functional History: Prior Function Prior Level of Function : Independent/Modified Independent Mobility Comments: typically independent without DME ADLs Comments: ind; worked as a [employer] manager Status:  Mobility: Bed Mobility Overal bed mobility: Needs Assistance Bed Mobility: Supine to Sit Rolling: Max assist Supine to sit: Max assist General bed mobility comments: up in recliner Transfers Overall transfer level: Needs assistance Equipment used: Rolling walker (2 wheels) Transfers: Sit to/from Stand Sit to Stand: Mod assist Bed to/from chair/wheelchair/BSC transfer type:: Step pivot Step pivot transfers: Min assist, +2 physical assistance, +2 safety/equipment General transfer comment: some lifting help, pt using arms, but with pain in chest Ambulation/Gait Ambulation/Gait assistance: Min assist, +2 safety/equipment Gait Distance (Feet): 45 Feet (&15') Assistive device: Rolling walker (2 wheels) Gait Pattern/deviations: Step-to pattern, Decreased stride length, Trunk flexed, Shuffle General Gait Details: very slow and difficulty picking up her legs stating she feels so heavy and like she is 67 y/o; seated rest, on 100% NRB with ambulation   ADL: ADL Overall ADL's : Needs assistance/impaired Eating/Feeding: NPO Grooming: Set up, Sitting Grooming Details (indicate cue type and reason): wiping face Upper Body Bathing: Moderate assistance, Bed level Lower Body Bathing: Maximal assistance, Bed level Upper Body Dressing : Moderate assistance, Bed level Lower Body Dressing: Maximal assistance, Bed level Toilet Transfer: Maximal assistance Toileting- Clothing Manipulation and Hygiene: Total assistance Functional mobility during ADLs: Maximal assistance   Cognition: Cognition Overall Cognitive Status: Within Functional Limits for tasks assessed Orientation  Level: Oriented X4 Cognition Arousal: Alert Behavior During Therapy: Anxious Overall Cognitive Status: Within Functional Limits for tasks assessed General Comments: pt with known high anxiety and is on xanax which was started yesterday which inhibits the ability to progress mobility, pt was able to follow commands with increased time and answer questions appropriately   Physical Exam: Blood pressure (!) 183/74, pulse 88, temperature 98.6 F (37 C), temperature source Oral, resp. rate 20, height 5\' 8"  (1.727 m), weight 102.4 kg, SpO2 93%. Physical Exam    PE: Constitution: Appropriate appearance for age. No apparent distress  +Obese Resp: No respiratory distress. No accessory muscle usage.  Right lower > left lower lobe mild rhonci and on 2 L [place] Cardio: Well perfused appearance. No peripheral edema. Abdomen: Mildly distended. Nontender.   Psych: Appropriate mood and affect. Neuro: AAOx4. No apparent cognitive deficits  Skin: Groin catheterization sites well-healed, without apparent hematoma.  Clean, dry, intact.  Neurologic Exam:   DTRs: Reflexes were 2+ in bilateral achilles, patella, biceps, BR and triceps. Babinsky: flexor responses b/l.   Hoffmans: negative b/l Sensory exam: revealed normal sensation in all dermatomal regions in bilateral upper extremities and bilateral lower extremities Motor exam: strength 5-/5 throughout bilateral upper extremities and bilateral lower extremities Coordination: Fine motor coordination was normal.  Mild bilateral upper extremity intention tremor  MSK: Tender to palpation over sternum and left chest, no bruising or apparent lesions  Lab Results Last 48 Hours        Results for orders placed or performed during the hospital encounter of 01/23/23 (from the past 48 hour(s))  Glucose, capillary     Status: Abnormal    Collection Time: 02/13/23 11:39 AM  Result Value Ref Range    Glucose-Capillary 118 (H) 70 - 99 mg/dL      Comment: Glucose  reference range applies  only to samples taken after fasting for at least 8 hours.  Glucose, capillary     Status: Abnormal    Collection Time: 02/13/23  4:14 PM  Result Value Ref Range    Glucose-Capillary 119 (H) 70 - 99 mg/dL      Comment: Glucose reference range applies only to samples taken after fasting for at least 8 hours.  Glucose, capillary     Status: Abnormal    Collection Time: 02/13/23  7:41 PM  Result Value Ref Range    Glucose-Capillary 122 (H) 70 - 99 mg/dL      Comment: Glucose reference range applies only to samples taken after fasting for at least 8 hours.  Glucose, capillary     Status: Abnormal    Collection Time: 02/13/23 11:28 PM  Result Value Ref Range    Glucose-Capillary 140 (H) 70 - 99 mg/dL      Comment: Glucose reference range applies only to samples taken after fasting for at least 8 hours.  Glucose, capillary     Status: None    Collection Time: 02/14/23  3:34 AM  Result Value Ref Range    Glucose-Capillary 88 70 - 99 mg/dL      Comment: Glucose reference range applies only to samples taken after fasting for at least 8 hours.  Cooxemetry Panel (carboxy, met, total hgb, O2 sat)     Status: Abnormal    Collection Time: 02/14/23  3:46 AM  Result Value Ref Range    Total hemoglobin 8.8 (L) 12.0 - 16.0 g/dL    O2 Saturation 16.1 %    Carboxyhemoglobin 1.4 0.5 - 1.5 %    Methemoglobin <0.7 0.0 - 1.5 %      Comment: Performed at [facility], [address] [place], [place] [address]  Renal function panel     Status: Abnormal    Collection Time: 02/14/23  3:46 AM  Result Value Ref Range    Sodium 129 (L) 135 - 145 mmol/L    Potassium 4.1 3.5 - 5.1 mmol/L    Chloride 90 (L) 98 - 111 mmol/L    CO2 33 (H) 22 - 32 mmol/L    Glucose, Bld 101 (H) 70 - 99 mg/dL      Comment: Glucose reference range applies only to samples taken after fasting for at least 8 hours.    BUN 21 8 - 23 mg/dL    Creatinine, Ser 5.40 0.44 - 1.00 mg/dL    Calcium 8.7 (L) 8.9 -  10.3 mg/dL    Phosphorus 4.4 2.5 - 4.6 mg/dL    Albumin 2.4 (L) 3.5 - 5.0 g/dL    GFR, Estimated >98 >11 mL/min      Comment: (NOTE) Calculated using the CKD-EPI Creatinine Equation (2021)      Anion gap 6 5 - 15      Comment: Performed at [facility], [address], [place], [place] [address]  Brain natriuretic peptide     Status: Abnormal    Collection Time: 02/14/23  3:46 AM  Result Value Ref Range    B Natriuretic Peptide 418.7 (H) 0.0 - 100.0 pg/mL      Comment: Performed at [facility], [address] [place], [place] [address]  CBC with Differential/Platelet     Status: Abnormal    Collection Time: 02/14/23  3:46 AM  Result Value Ref Range    WBC 8.1 4.0 - 10.5 K/uL    RBC 2.93 (L) 3.87 -  5.11 MIL/uL    Hemoglobin 9.1 (L) 12.0 - 15.0 g/dL    HCT 16.1 (L) 09.6 - 46.0 %    MCV 96.2 80.0 - 100.0 fL    MCH 31.1 26.0 - 34.0 pg    MCHC 32.3 30.0 - 36.0 g/dL    RDW 04.5 40.9 - 81.1 %    Platelets 424 (H) 150 - 400 K/uL    nRBC 0.0 0.0 - 0.2 %    Neutrophils Relative % 63 %    Neutro Abs 5.1 1.7 - 7.7 K/uL    Lymphocytes Relative 16 %    Lymphs Abs 1.3 0.7 - 4.0 K/uL    Monocytes Relative 9 %    Monocytes Absolute 0.7 0.1 - 1.0 K/uL    Eosinophils Relative 9 %    Eosinophils Absolute 0.7 (H) 0.0 - 0.5 K/uL    Basophils Relative 1 %    Basophils Absolute 0.1 0.0 - 0.1 K/uL    Immature Granulocytes 2 %    Abs Immature Granulocytes 0.15 (H) 0.00 - 0.07 K/uL      Comment: Performed at [facility], [address], [place], [place] [address]  Glucose, capillary     Status: Abnormal    Collection Time: 02/14/23  8:43 AM  Result Value Ref Range    Glucose-Capillary 118 (H) 70 - 99 mg/dL      Comment: Glucose reference range applies only to samples taken after fasting for at least 8 hours.  Glucose, capillary     Status: Abnormal    Collection Time: 02/14/23 11:42 AM  Result Value Ref Range    Glucose-Capillary 177 (H) 70 - 99 mg/dL      Comment: Glucose  reference range applies only to samples taken after fasting for at least 8 hours.  Glucose, capillary     Status: Abnormal    Collection Time: 02/14/23  4:41 PM  Result Value Ref Range    Glucose-Capillary 103 (H) 70 - 99 mg/dL      Comment: Glucose reference range applies only to samples taken after fasting for at least 8 hours.  Glucose, capillary     Status: None    Collection Time: 02/14/23  9:02 PM  Result Value Ref Range    Glucose-Capillary 95 70 - 99 mg/dL      Comment: Glucose reference range applies only to samples taken after fasting for at least 8 hours.    Comment 1 Notify RN      Comment 2 Document in Chart    Glucose, capillary     Status: Abnormal    Collection Time: 02/15/23  6:01 AM  Result Value Ref Range    Glucose-Capillary 108 (H) 70 - 99 mg/dL      Comment: Glucose reference range applies only to samples taken after fasting for at least 8 hours.    Comment 1 Notify RN        Imaging Results (Last 48 hours)  No results found.         Blood pressure (!) 183/74, pulse 88, temperature 98.6 F (37 C), temperature source Oral, resp. rate 20, height 5\' 8"  (1.727 m), weight 102.4 kg, SpO2 93%.   Medical Problem List and Plan: 1. 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  Antithrombotics: -DVT/anticoagulation:  Pharmaceutical: Eliquis 5 mg twice daily             -antiplatelet therapy: Aspirin and Plavix    3. Pain Management: Tylenol, oxycodone 5 mg as needed             -continue Robaxin 500 mg BID             -continue gabapentin 300 mg TID             -continue Lidoderm patches   4. Mood/Behavior/Sleep: LCSW to evaluate and provide emotional support. History of anxiety/depression             -continue Buspar 10 mg BID and  -continue Xanax 3 mg BID and once daily   prn             -refusing Prozac 20 mg q HS>>will discontinue             -continue melatonin 3 mg q HS             -antipsychotic agents: n/a   5. Neuropsych/cognition: This patient is capable of making decisions on her own behalf.   6. Skin/Wound Care: Routine skin care checks   7. Fluids/Electrolytes/Nutrition: Routine Is and Os and follow-up chemistries             -hyponatremia>>improving, follow-up BMP   8: Hypertension: monitor TID and prn             -see meds #12   9: Hyperlipidemia: continue statin   10: CAD s/p PCI to RCA 2021; to LAD on 8/07: continue DAPT and statin   11: Aortic stenosis s/p TAVR 8/15: DAPT continues   12: Acute systolic CHF; EF ~65-78%; Lasix discontinued             -daily weight             -continue Farxiga 10 mg daily             -continue spironlactone 25 mg daily             -continue Entresto 49-51 BID (this dose started 8/26)   13: Urinary retention: most likely due to constipation and immobility             -Foley now out   14: Refractory VT resolved: continue amiodarone 200 mg BID   15: Carotid stenosis s/p right CEA 2020             -follow-up with [facility] as per protocol   16: Anemia; acute blood loss/other: follow-up CBC   17: Acute DVT left internal jugular>>resolved on recent venous duplex, superficial cephalic vein thrombosis present             -plan is to continue triple therapy and discontinue aspirin 30 days post-PCI              -continue Eliquis 5 mg BID   18: 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 I am in agreement with the above recommendations.   Angelina Sheriff, DO 02/18/2023

## 2023-02-18 NOTE — Progress Notes (Addendum)
Inpatient Rehab Admissions Coordinator:  There is a bed available for pt in [facility] today. Dr. Jomarie Longs is aware and in agreement. Pt, NSG, and TOC made aware. Attempted to contact daughter Baird Lyons. Left a message.   [id number]: Baird Lyons returned call. Informed her that pt will be admitted to CIR today.   Wolfgang Phoenix, MS, CCC-SLP Admissions Coordinator [phone]

## 2023-02-18 NOTE — Discharge Summary (Addendum)
Physician Discharge Summary  Madison West GMW:[id number] DOB: 12-20-1955 DOA: 01/23/2023  PCP: Madison Coca, MD  Admit date: 01/23/2023 Discharge date: 02/18/2023  Time spent: 45 minutes  Recommendations for Outpatient Follow-up:  CIR for short-term rehab BMP in 1 week Discontinue aspirin after 1 month Advanced heart failure clinic in 2 to 3 weeks   Discharge Diagnoses:  Principal Problem:   Acute on chronic systolic CHF (congestive heart failure)   Cardiogenic shock Refractory V. Tach NSTEMI Severe aortic stenosis sp TAVR Severe anxiety Left IJ DVT   Coronary artery disease involving native coronary artery of native heart with unstable angina pectoris (HCC)   Nonrheumatic aortic valve stenosis   Anxiety   Pure hypercholesterolemia   Hypertension   Bilateral carotid artery stenosis   Sepsis due to pneumonia [facility])   DVT (deep venous thrombosis) (HCC)   Hyponatremia   Discharge Condition: improved  Diet recommendation: Heart healthy  Filed Weights   02/16/23 0900 02/17/23 0453 02/18/23 0500  Weight: 100.4 kg 100.4 kg 101.2 kg    History of present illness:  67/F w history of PSVT, anxiety, breast cancer sp lumpectomy/ radiation 2009, hypertension, hyperlipidemia, coronary artery disease, carotid artery disease, and mild to moderate aortic stenosis presented with dyspnea, weight gain and generalized weakness. Patient was placed on IV diuretic therapy.   Hospital Course:  67/F w history of PSVT, anxiety, breast cancer sp lumpectomy/ radiation 2009, hypertension, hyperlipidemia, coronary artery disease, carotid artery disease, and mild to moderate aortic stenosis presented with dyspnea, weight gain and generalized weakness. Patient was placed on IV diuretic therapy.  -Echo noted EF 30-35% with severe aortic stenosis.  -8/07 cardiac catheterization with mid LAD 95% lesion, and underwent stent placement.  CT surgery consulted for severe aortic stenosis.  -8/11 patient  on floor and had pulseless VT. CPR initiated, given Epi, and shocked x 2 with ROSC. Patient alert after CPR event but slightly confused. -Followed by recurrent VT was shocked. Given amio, mag, calcium. started on amio drip, transferred to [facility] ICU and cardiology consulted. EKG showing ST elevation in anterior leads. Code STEMI.  Required IABP placement 8/13.  8/15 -underwent TAVR  8/16 Doing better, weaning pressors, intubated, IABP in place 8/17 IABP removed 8/18 Extubated 8/19 Swan removed, Lasix gtt stopped. PICC placed. 8/20 MBS. Weaning milrinone. Started spiro and losartan. Completed Abx for PNS  8/21 Foley replaced, . L internal jugular DVT and L cephalic SVT    8/22 milrinone wean to 0.125.  8/23: milrinone stopped 8/24 stable off all vasoactives 8/26 transferred to [facility] from [facility] 8/27, hypotensive, discharged to CIR canceled  Detailed course  Acute on chronic systolic CHF Cardiogenic shock -from ischemic CM and severe AS w/ recurrent VT -initial Echo 01/25/23: EF 30-35%, septum and apex HK,severe AS  - s/p PCI/stent to LAD 8/7. Stent thrombosis on 8/09 treated with thrombectomy.  - IABP placed 08/13.  - S/p TAVR 8/15 - Echo 8/16 with EF 35-40%, mild LVH, WMAs, RV normal, bioprosthetic aortic valve s/p TAVR with mean gradient 13 mmH, trivial PVL, IVC mildly dilated.  - off balloon pump 8/17, taken off milrinone -Followed by advanced heart failure team, drop in blood pressure yesterday, Entresto dose decreased and Lasix held -restarted on Farxiga yesterday, Foley catheter removed -Continue Aldactone, beta-blocker on hold -Discharge to CIR for short-term rehab, follow-up in advanced heart failure clinic   Refractive VT.  Sp multiple direct current cardioversion. Now on oral amiodarone.   Electrolytes stable   Acute hypoxemic respiratory failure  due to cardiogenic pulmonary edema.  -extubated 8/18   CAD NSTEMI.  LAD sent placed on 08/07  -developed stent thrombosis 08/09  treated with thrombectomy.  - repeat LHC 8/12 noted patent LAD stent  -continue DAPT, statin -Since she is on Eliquis for DVT, recommend to discontinue aspirin after 1 month   Nonrheumatic aortic valve stenosis SP TAVR 8/15 with good toleration.    Pneumonia -completed Abx therapy   Anxiety Resume alprazolam.  Continue with buspirone and hydroxyzine.    Hypertension Continue blood pressure control with entresto.  Transient hypotension likely due to over diuresis.    Bilateral carotid artery stenosis Had right carotid endarterectomy in 2020.     Sepsis due to pneumonia (HCC) Enterobacter cloacae in trach aspirate.  Sepsis has resolved,.  Completed antibiotic therapy   DVT (deep venous thrombosis) (HCC) Left internal jugular vein diagnosed on 8/21 -Started on anticoagulation with apixaban -Repeat Doppler 8/28 noted resolution of DVT, she has superficial thrombosis of left cephalic vein -Recommend apixaban for 3 to 6 months   Anemia of chronic disease, -Stable, trend   Hyponatremia -Improving, monitor    Procedures:   Discharge Exam: Vitals:   02/18/23 0500 02/18/23 1207  BP: 101/61 129/66  Pulse: 78   Resp: 16 20  Temp: 98.6 F (37 C) (!) 97.1 F (36.2 C)  SpO2: 90%    General exam: Appears calm and comfortable HEENT: No JVD Respiratory system: Decreased sounds at the bases Cardiovascular system: S1 & S2 heard, RRR.  Abd: nondistended, soft and nontender.Normal bowel sounds heard. Central nervous system: Alert and oriented. No focal neurological deficits. Extremities: Trace edema Psychiatry:  Mood & affect appropriate.   Discharge Instructions   Discharge Instructions     AMB Referral to Cardiac Rehabilitation - Phase II   Complete by: As directed    Diagnosis: STEMI   After initial evaluation and assessments completed: Virtual Based Care may be provided alone or in conjunction with Phase 2 Cardiac Rehab based on patient barriers.: Yes   Intensive  Cardiac Rehabilitation (ICR) [facility] location only OR Traditional Cardiac Rehabilitation (TCR) *If criteria for ICR are not met will enroll in TCR [facility] only): Yes   Amb Referral to Cardiac Rehabilitation   Complete by: As directed    Diagnosis: Coronary Stents   After initial evaluation and assessments completed: Virtual Based Care may be provided alone or in conjunction with Phase 2 Cardiac Rehab based on patient barriers.: Yes   Intensive Cardiac Rehabilitation (ICR) [facility] location only OR Traditional Cardiac Rehabilitation (TCR) *If criteria for ICR are not met will enroll in TCR [facility] only): Yes      Allergies as of 02/18/2023       Reactions   Prednisone Palpitations   Fentanyl Swelling, Other (See Comments)   Makes pt "hyper" feels she is "climbing the walls."  Last time she had a colonoscopy with Fentanyl and Versed she was awake the whole time.   Versed [midazolam] Swelling, Anxiety   Makes pt "hyper" feels she is "climbing the walls."  Last time she had a colonoscopy with Fentanyl and Versed she was awake the whole time.   Vicodin [hydrocodone-acetaminophen] Anxiety   Extreme anxiety. OK to take oxycodone.        Medication List     STOP taking these medications    losartan-hydrochlorothiazide 50-12.5 MG tablet Commonly known as: HYZAAR       TAKE these medications    acetaminophen 500 MG tablet Commonly known as: TYLENOL Take  1,000 mg by mouth every 6 (six) hours as needed for fever or mild pain.   alprazolam 2 MG tablet Commonly known as: XANAX Take 0.25-2 mg by mouth See admin instructions. 1 mg (may take up to 2 mg) twice daily. May also take 0.25 mg during the day if needed for anxiety.   amiodarone 200 MG tablet Commonly known as: PACERONE Take 1 tablet (200 mg total) by mouth 2 (two) times daily.   apixaban 5 MG Tabs tablet Commonly known as: ELIQUIS Take 1 tablet (5 mg total) by mouth 2 (two) times daily.   aspirin 81 MG chewable tablet Chew 81 mg by  mouth daily.   busPIRone 10 MG tablet Commonly known as: BUSPAR Take 1 tablet (10 mg total) by mouth 2 (two) times daily.   clopidogrel 75 MG tablet Commonly known as: PLAVIX Take 1 tablet (75 mg total) by mouth daily. Start taking on: February 19, 2023   CO Q 10 PO Take 1 capsule by mouth daily.   dapagliflozin propanediol 10 MG Tabs tablet Commonly known as: FARXIGA Take 1 tablet (10 mg total) by mouth daily. Start taking on: February 19, 2023   FISH OIL PO Take 1 capsule by mouth daily.   FLUoxetine 20 MG capsule Commonly known as: PROZAC Take 1 capsule (20 mg total) by mouth at bedtime.   gabapentin 300 MG capsule Commonly known as: NEURONTIN Take 1 capsule (300 mg total) by mouth 2 (two) times daily.   GARLIC PO Take 1 capsule by mouth daily.   lidocaine 5 % Commonly known as: LIDODERM Place 2 patches onto the skin daily as needed. Remove & Discard patch within 12 hours or as directed by MD   methocarbamol 500 MG tablet Commonly known as: ROBAXIN Take 1 tablet (500 mg total) by mouth 2 (two) times daily.   nitroGLYCERIN 0.4 MG SL tablet Commonly known as: NITROSTAT Place 1 tablet (0.4 mg total) under the tongue every 5 (five) minutes as needed for chest pain.   polyethylene glycol 17 g packet Commonly known as: MIRALAX / GLYCOLAX Take 17 g by mouth daily as needed.   rosuvastatin 20 MG tablet Commonly known as: CRESTOR Take 1 tablet (20 mg total) by mouth daily. Start taking on: February 19, 2023   sacubitril-valsartan 49-51 MG Commonly known as: ENTRESTO Take 1 tablet by mouth 2 (two) times daily.   spironolactone 25 MG tablet Commonly known as: ALDACTONE Take 1 tablet (25 mg total) by mouth daily. Start taking on: February 19, 2023   TURMERIC CURCUMIN PO Take 1 tablet by mouth daily.   VITAMIN C PO Take 2 tablets by mouth daily.   VITAMIN D3 PO Take 1 capsule by mouth daily.   ZINC PO Take 1 tablet by mouth daily.       Allergies  Allergen  Reactions   Prednisone Palpitations   Fentanyl Swelling and Other (See Comments)    Makes pt "hyper" feels she is "climbing the walls."  Last time she had a colonoscopy with Fentanyl and Versed she was awake the whole time.   Versed [Midazolam] Swelling and Anxiety    Makes pt "hyper" feels she is "climbing the walls."  Last time she had a colonoscopy with Fentanyl and Versed she was awake the whole time.   Vicodin [Hydrocodone-Acetaminophen] Anxiety    Extreme anxiety.  OK to take oxycodone.    Follow-up Information     Madison Coca, MD Follow up in 1 week(s).   Specialty: Obstetrics  and Gynecology Contact information: [facility] [address] [place] [address] [phone]         Tonny Bollman, MD Follow up on 02/15/2023.   Specialty: Cardiology Why: Dr. Excell Seltzer for TAVR consult on 8/26. Contact information: [address] [place] [address] [phone]         Elder Negus, MD Follow up in 1 week(s).   Specialties: Cardiology, Radiology Why: or as scheduled by the clinic Contact information: [address] [place] [address] [phone]                  The results of significant diagnostics from this hospitalization (including imaging, microbiology, ancillary and laboratory) are listed below for reference.    Significant Diagnostic Studies: VAS [place] UPPER EXTREMITY VENOUS DUPLEX  Result Date: 02/17/2023 UPPER VENOUS STUDY  Patient Name:  Madison West  Date of Exam:   02/17/2023 Medical Rec #: [id number]            Accession #:    [id number] Date of Birth: [birth year]            Patient Gender: F Patient Age:   67 years Exam Location:  [facility] Procedure:      VAS [place] UPPER EXTREMITY VENOUS DUPLEX Referring Phys: Lillia Abed [facility] --------------------------------------------------------------------------------  Indications: Left forearm pain, indwelling PICC, recent examination positive for  acute IJV DVT and cephalic SVT Limitations: Bandages. PICC line. Comparison Study: 02-10-2023 Left upper extremity venous study positive for                   acute DVT involving the IJV and SVT involving the cephalic                   vein. Performing Technologist: Jean Rosenthal RDMS, RVT  Examination Guidelines: A complete evaluation includes B-mode imaging, spectral Doppler, color Doppler, and power Doppler as needed of all accessible portions of each vessel. Bilateral testing is considered an integral part of a complete examination. Limited examinations for reoccurring indications may be performed as noted.  Right Findings: +----------+------------+---------+-----------+----------+-------+ RIGHT     CompressiblePhasicitySpontaneousPropertiesSummary +----------+------------+---------+-----------+----------+-------+ Subclavian               Yes       Yes                      +----------+------------+---------+-----------+----------+-------+  Left Findings: +----------+------------+---------+-----------+-----------------+--------------+ LEFT      CompressiblePhasicitySpontaneous   Properties       Summary     +----------+------------+---------+-----------+-----------------+--------------+ IJV           Full       Yes       Yes                                    +----------+------------+---------+-----------+-----------------+--------------+ Subclavian               Yes       Yes                                    +----------+------------+---------+-----------+-----------------+--------------+ Axillary      Full       Yes       Yes                                    +----------+------------+---------+-----------+-----------------+--------------+  Brachial      Full                        High bifurcation,                                                              Limited                     +----------+------------+---------+-----------+-----------------+--------------+ Radial        Full                                                        +----------+------------+---------+-----------+-----------------+--------------+ Ulnar         Full                                                        +----------+------------+---------+-----------+-----------------+--------------+ Cephalic    Partial      No        No                           Age                                                                  Indeterminate  +----------+------------+---------+-----------+-----------------+--------------+ Basilic       Full                             Limited                    +----------+------------+---------+-----------+-----------------+--------------+  Summary:  Right: No evidence of thrombosis in the subclavian.  Left: No evidence of deep vein thrombosis in the upper extremity. Findings consistent with age indeterminate superficial vein thrombosis involving the left cephalic vein- essentially unchanged as compared to previous examination. Previously visualized internal jugular vein DVT appears resolved on today's examination.  *See table(s) above for measurements and observations.  Diagnosing physician: Sherald Hess MD Electronically signed by Sherald Hess MD on 02/17/2023 at 1:14:30 PM.    Final    DG CHEST PORT 1 VIEW  Result Date: 02/13/2023 CLINICAL DATA:  Respiratory failure EXAM: PORTABLE CHEST 1 VIEW COMPARISON:  02/12/2023 FINDINGS: Patchy indistinct density in the lower lungs and peripherally in the mid chest. Small pleural effusions are possible, especially on the left. No pneumothorax. Normal heart size and stable mediastinal contours. Transcatheter aortic valve replacement. Left PICC with tip at the SVC. IMPRESSION: Bilateral pulmonary infiltrates suggesting pneumonia. Electronically Signed   By: Tiburcio Pea M.D.   On: 02/13/2023 09:28    DG CHEST PORT 1 VIEW  Result Date: 02/12/2023  CLINICAL DATA:  [id number] with acute respiratory failure with hypoxia. EXAM: PORTABLE CHEST 1 VIEW COMPARISON:  Portable chest 02/10/2023 at 5:58 a.m. FINDINGS: 4:41 a.m. Left PICC terminates above the superior cavoatrial junction. There are overlying monitor wires. There is a TAVR stent in place. Cardiomegaly with mild central vascular fullness. There are small pleural effusions and patchy airspace disease in the mid to lower lung fields. No new or worsening lung opacity is seen. The mediastinum is stable with aortic tortuosity and calcifications. No new osseous findings. Compare: Overall aeration seems unchanged. IMPRESSION: Unchanged cardiomegaly with mild central vascular fullness. Small pleural effusions and patchy airspace disease in the mid to lower lung fields. No new or worsening lung opacity. Electronically Signed   By: Almira Bar M.D.   On: 02/12/2023 05:44   VAS [place] UPPER EXTREMITY VENOUS DUPLEX  Result Date: 02/10/2023 UPPER VENOUS STUDY  Patient Name:  SHAULA PARADY  Date of Exam:   02/10/2023 Medical Rec #: [id number]            Accession #:    [id number] Date of Birth: 20-Feb-1956            Patient Gender: F Patient Age:   8 years Exam Location:  [facility] Procedure:      VAS [place] UPPER EXTREMITY VENOUS DUPLEX Referring Phys: Levon Hedger --------------------------------------------------------------------------------  Indications: Pain, and Swelling Limitations: PICC line placement/bandage. Comparison Study: No previous study. Performing Technologist: McKayla Maag RVT, VT  Examination Guidelines: A complete evaluation includes B-mode imaging, spectral Doppler, color Doppler, and power Doppler as needed of all accessible portions of each vessel. Bilateral testing is considered an integral part of a complete examination. Limited examinations for reoccurring indications may be performed as noted.  Right Findings:  +----------+------------+---------+-----------+----------+-------+ RIGHT     CompressiblePhasicitySpontaneousPropertiesSummary +----------+------------+---------+-----------+----------+-------+ Subclavian    Full       Yes       Yes                      +----------+------------+---------+-----------+----------+-------+  Left Findings: +----------+------------+---------+-----------+----------+-------+ LEFT      CompressiblePhasicitySpontaneousPropertiesSummary +----------+------------+---------+-----------+----------+-------+ IJV         Partial      Yes       Yes               Acute  +----------+------------+---------+-----------+----------+-------+ Subclavian    Full                                          +----------+------------+---------+-----------+----------+-------+ Axillary      Full                                          +----------+------------+---------+-----------+----------+-------+ Brachial      Full                                  limited +----------+------------+---------+-----------+----------+-------+ Radial        Full                                          +----------+------------+---------+-----------+----------+-------+ Ulnar  Full                                          +----------+------------+---------+-----------+----------+-------+ Cephalic    Partial      No        No                Acute  +----------+------------+---------+-----------+----------+-------+ Basilic       Full                                  limited +----------+------------+---------+-----------+----------+-------+  Summary:  Right: No evidence of thrombosis in the subclavian.  Left: Findings consistent with acute deep vein thrombosis involving the left internal jugular vein. Findings consistent with acute superficial vein thrombosis involving the left cephalic vein, proximal upper arm to mid forearm.  *See table(s) above for  measurements and observations.  Diagnosing physician: Heath Lark Electronically signed by Heath Lark on 02/10/2023 at 8:52:28 PM.    Final    DG Chest Port 1 View  Result Date: 02/10/2023 CLINICAL DATA:  Hypoxia EXAM: PORTABLE CHEST 1 VIEW COMPARISON:  02/04/2023 FINDINGS: Left-sided PICC line has been advanced and now terminates at the superior cavoatrial junction. The remaining previously seen lines and tubes have been removed. Stable cardiomegaly status post TAVR. Small to moderate bilateral pleural effusions with hazy bibasilar opacities. No pneumothorax. IMPRESSION: Small to moderate bilateral pleural effusions with hazy bibasilar opacities, which may represent atelectasis, edema, or infection. Electronically Signed   By: Duanne Guess D.O.   On: 02/10/2023 10:10   [place] EKG SITE RITE  Result Date: 02/08/2023 If Site Rite image not attached, placement could not be confirmed due to current cardiac rhythm.  ECHOCARDIOGRAM COMPLETE  Result Date: 02/05/2023    ECHOCARDIOGRAM REPORT   Patient Name:   Madison West Goldring Date of Exam: 02/05/2023 Medical Rec #:  [id number]           Height:       68.0 in Accession #:    [id number]          Weight:       231.7 lb Date of Birth:  07/19/1955           BSA:          2.176 m Patient Age:    67 years            BP:           110/79 mmHg Patient Gender: F                   HR:           57 bpm. Exam Location:  Inpatient Procedure: 2D Echo, Color Doppler and Cardiac Doppler Indications:    Post TAVR z95.2  History:        Patient has prior history of Echocardiogram examinations, most                 recent 02/04/2023. CHF; Risk Factors:Hypertension and                 Dyslipidemia.                 Aortic Valve: 26 mm Edwards Sapien prosthetic, stented (TAVR)  valve is present in the aortic position. Procedure Date:                 02/04/23.  Sonographer:    Irving Burton Senior RDCS Referring Phys: [id number] Wille Celeste THOMPSON IMPRESSIONS  1. Left  ventricular ejection fraction, by estimation, is 35 to 40%. Left ventricular ejection fraction by 2D MOD biplane is 44.1 %. The left ventricle has moderately decreased function. The left ventricle demonstrates regional wall motion abnormalities (see scoring diagram/findings for description). The left ventricular internal cavity size was mildly dilated. There is mild concentric left ventricular hypertrophy. Left ventricular diastolic parameters are consistent with Grade I diastolic dysfunction (impaired relaxation). There is severe hypokinesis of the left ventricular, mid-apical apical segment, anterior wall and anteroseptal wall.  2. Right ventricular systolic function is normal. The right ventricular size is normal. There is normal pulmonary artery systolic pressure. The estimated right ventricular systolic pressure is 31.6 mmHg.  3. The mitral valve is degenerative. Trivial mitral valve regurgitation. Moderate mitral annular calcification.  4. The aortic valve has been repaired/replaced. Aortic valve regurgitation is trivial. There is a 26 mm Edwards Sapien prosthetic (TAVR) valve present in the aortic position. Procedure Date: 02/04/23. Echo findings are consistent with normal structure and function of the aortic valve prosthesis. Aortic valve mean gradient measures 13.0 mmHg. Aortic valve Vmax measures 2.36 m/s. Aortic valve acceleration time measures 74 msec.  5. The inferior vena cava is dilated in size with <50% respiratory variability, suggesting right atrial pressure of 15 mmHg. FINDINGS  Left Ventricle: Left ventricular ejection fraction, by estimation, is 35 to 40%. Left ventricular ejection fraction by 2D MOD biplane is 44.1 %. The left ventricle has moderately decreased function. The left ventricle demonstrates regional wall motion abnormalities. Severe hypokinesis of the left ventricular, mid-apical apical segment, anterior wall and anteroseptal wall. The left ventricular internal cavity size was mildly  dilated. There is mild concentric left ventricular hypertrophy. Left ventricular diastolic parameters are consistent with Grade I diastolic dysfunction (impaired relaxation). Indeterminate filling pressures.  LV Wall Scoring: The mid and distal anterior wall, entire anterior septum, mid inferoseptal segment, apical inferior segment, and apex are hypokinetic. The entire lateral wall, inferior wall, basal anterior segment, and basal inferoseptal segment are normal. Right Ventricle: The right ventricular size is normal. No increase in right ventricular wall thickness. Right ventricular systolic function is normal. There is normal pulmonary artery systolic pressure. The tricuspid regurgitant velocity is 2.04 m/s, and  with an assumed right atrial pressure of 15 mmHg, the estimated right ventricular systolic pressure is 31.6 mmHg. Left Atrium: Left atrial size was normal in size. Right Atrium: Right atrial size was normal in size. Pericardium: There is no evidence of pericardial effusion. Mitral Valve: The mitral valve is degenerative in appearance. There is mild thickening of the mitral valve leaflet(s). Moderate mitral annular calcification. Trivial mitral valve regurgitation. Tricuspid Valve: The tricuspid valve is normal in structure. Tricuspid valve regurgitation is trivial. Aortic Valve: The aortic valve has been repaired/replaced. Aortic valve regurgitation is trivial. Aortic valve mean gradient measures 13.0 mmHg. Aortic valve peak gradient measures 22.3 mmHg. Aortic valve area, by VTI measures 1.75 cm. There is a 26 mm Edwards Sapien prosthetic, stented (TAVR) valve present in the aortic position. Procedure Date: 02/04/23. Echo findings are consistent with normal structure and function of the aortic valve prosthesis. Pulmonic Valve: The pulmonic valve was normal in structure. Pulmonic valve regurgitation is not visualized. No evidence of pulmonic stenosis. Aorta: The aortic root and  ascending aorta are  structurally normal, with no evidence of dilitation. Venous: IVC assessment for right atrial pressure unable to be performed due to mechanical ventilation. The inferior vena cava is dilated in size with less than 50% respiratory variability, suggesting right atrial pressure of 15 mmHg. IAS/Shunts: No atrial level shunt detected by color flow Doppler.  LEFT VENTRICLE PLAX 2D                        Biplane EF (MOD) LVIDd:         4.57 cm         LV Biplane EF:   Left LVIDs:         3.10 cm                          ventricular LV PW:         1.30 cm                          ejection LV IVS:        1.20 cm                          fraction by LVOT diam:     2.10 cm                          2D MOD LV SV:         70                               biplane is LV SV Index:   32                               44.1 %. LVOT Area:     3.46 cm                                Diastology                                LV e' medial:    6.37 cm/s LV Volumes (MOD)               LV E/e' medial:  16.2 LV vol d, MOD    149.0 ml      LV e' lateral:   7.93 cm/s A2C:                           LV E/e' lateral: 13.0 LV vol d, MOD    113.0 ml A4C: LV vol s, MOD    85.6 ml A2C: LV vol s, MOD    64.2 ml A4C: LV SV MOD A2C:   63.4 ml LV SV MOD A4C:   113.0 ml LV SV MOD BP:    60.8 ml RIGHT VENTRICLE RV S prime:     14.80 cm/s TAPSE (M-mode): 2.2 cm LEFT ATRIUM             Index        RIGHT ATRIUM           Index  LA diam:        3.10 cm 1.42 cm/m   RA Area:     17.40 cm LA Vol (A2C):   56.7 ml 26.06 ml/m  RA Volume:   47.80 ml  21.97 ml/m LA Vol (A4C):   47.8 ml 21.97 ml/m LA Biplane Vol: 54.7 ml 25.14 ml/m  AORTIC VALVE AV Area (Vmax):    1.83 cm AV Area (Vmean):   1.81 cm AV Area (VTI):     1.75 cm AV Vmax:           236.00 cm/s AV Vmean:          170.000 cm/s AV VTI:            0.402 m AV Peak Grad:      22.3 mmHg AV Mean Grad:      13.0 mmHg LVOT Vmax:         125.00 cm/s LVOT Vmean:        89.000 cm/s LVOT VTI:          0.203 m  LVOT/AV VTI ratio: 0.50  AORTA Ao Root diam: 2.70 cm Ao Asc diam:  3.10 cm MITRAL VALVE                TRICUSPID VALVE MV Area (PHT): 4.44 cm     TR Peak grad:   16.6 mmHg MV Decel Time: 171 msec     TR Vmax:        204.00 cm/s MV E velocity: 103.00 cm/s MV A velocity: 110.00 cm/s  SHUNTS MV E/A ratio:  0.94         Systemic VTI:  0.20 m                             Systemic Diam: 2.10 cm Rachelle Hora Croitoru MD Electronically signed by Thurmon Fair MD Signature Date/Time: 02/05/2023/10:19:14 AM    Final    EP STUDY  Result Date: 02/04/2023 See surgical note for result.  Structural Heart Procedure  Result Date: 02/04/2023 See surgical note for result.  ECHO TEE  Result Date: 02/04/2023    TRANSESOPHOGEAL ECHO REPORT   Patient Name:   NEYTIRI VANPELT [facility] Date of Exam: 02/04/2023 Medical Rec #:  [id number]           Height:       68.0 in Accession #:    [id number]          Weight:       230.6 lb Date of Birth:  1955/07/15           BSA:          2.171 m Patient Age:    67 years            BP:           108/78 mmHg Patient Gender: F                   HR:           63 bpm. Exam Location:  Inpatient Procedure: Limited Echo, Color Doppler and Cardiac Doppler Indications:     Aortic Stenosis i35.0  History:         Patient has prior history of Echocardiogram examinations, most                  recent 02/01/2023. CHF; Risk Factors:Hypertension and  Dyslipidemia.  Sonographer:     Irving Burton Senior RDCS Referring Phys:  [id number] Janetta Hora Diagnosing Phys: Thurmon Fair MD PROCEDURE: After discussion of the risks and benefits of a TEE, an informed consent was obtained from the patient. The transesophogeal probe was passed without difficulty through the esophogus of the patient. Sedation performed by different physician. The patient was monitored while under deep sedation. Supplementary images were obtained from transthoracic windows as indicated to answer the clinical question. The patient developed no  complications during the procedure.  PRE-PROCEDURE FINDINGS Severely decreased left ventricular systolic function. Severe anteroapical hypokinesis. Estimated LVEF 25% Trileaflet aortic valve with moderate-severe calcific stenosis. Aortic valve peak gradient 46 mm Hg, mean gradient 29 mm Hg, dimensionless index 0.30, calculated valve area 1.03 cm sq ( 0.4 cm sq/m sq BSA). Trivial aortic insufficiency. Trivial mitral insufficiency. No pericardial effusion. POST-PROCEDURE FINDINGS Improved, moderately decreased left ventricular systolic function. The wall motion abnormality pattern is similar, but there appears to be improved contractility in the anteroapical wall compared to baseline. Estimated LVEF 35% Well deployed stent valve (TAVR). Aortic valve peak gradient 13 mm Hg, mean gradient 6 mm Hg, dimensionless index 0.77, calculated valve area 2.43 cm sq (1.12 cm sq/m sq BSA), acceleration time 91 ms. Trivial perivalvular leak anteriorly ("12 o'clock"). Trivial mitral insufficiency. No pericardial effusion.  IMPRESSIONS  1. Left ventricular ejection fraction, by estimation, is 25-35%. The left ventricle has severely decreased function. The left ventricle demonstrates regional wall motion abnormalities (see scoring diagram/findings for description). There is mild concentric left ventricular hypertrophy.  2. Right ventricular systolic function is normal. The right ventricular size is normal.  3. No left atrial/left atrial appendage thrombus was detected.  4. The mitral valve is normal in structure. Trivial mitral valve regurgitation.  5. The aortic valve has been repaired/replaced. Aortic valve regurgitation is trivial. Procedure Date: 02/04/2023. Aortic valve area, by VTI measures 2.43 cm. Aortic valve Vmax measures 1.79 m/s.  6. Counterpulsation balloon pump in the descending aorta with tip ath the junction with the aortic arch. There is mild (Grade II) plaque.  7. Evidence of atrial level shunting detected by color  flow Doppler. There is a small patent foramen ovale with predominantly left to right shunting across the atrial septum. FINDINGS  Left Ventricle: Left ventricular ejection fraction, by estimation, is 25-35%. The left ventricle has severely decreased function. The left ventricle demonstrates regional wall motion abnormalities. The left ventricular internal cavity size was normal in  size. There is mild concentric left ventricular hypertrophy.  LV Wall Scoring: The entire anterior wall, mid and distal lateral wall, mid and distal anterior septum, and entire apex are hypokinetic. The antero-lateral wall, inferior wall, basal anteroseptal segment, basal inferolateral segment, mid inferoseptal segment, and basal inferoseptal segment are normal. Right Ventricle: The right ventricular size is normal. No increase in right ventricular wall thickness. Right ventricular systolic function is normal. Left Atrium: Left atrial size was normal in size. No left atrial/left atrial appendage thrombus was detected. Right Atrium: Right atrial size was normal in size. Pericardium: Trivial pericardial effusion is present. Mitral Valve: The mitral valve is normal in structure. Trivial mitral valve regurgitation. Tricuspid Valve: The tricuspid valve is normal in structure. Tricuspid valve regurgitation is trivial. Aortic Valve: The aortic valve has been repaired/replaced. Aortic valve regurgitation is trivial. Aortic regurgitation PHT measures 515 msec. Aortic valve mean gradient measures 6.0 mmHg. Aortic valve peak gradient measures 12.8 mmHg. Aortic valve area, by VTI measures 2.43 cm. There is a  26 mm Sapien prosthetic, stented (TAVR) valve present in the aortic position. Pulmonic Valve: The pulmonic valve was grossly normal. Pulmonic valve regurgitation is mild. Aorta: Counterpulsation balloon pump in the descending aorta with tip ath the junction with the aortic arch. The aortic root, ascending aorta and aortic arch are all  structurally normal, with no evidence of dilitation or obstruction. There is mild (Grade  II) plaque. IAS/Shunts: Evidence of atrial level shunting detected by color flow Doppler. A small patent foramen ovale is detected with predominantly left to right shunting across the atrial septum. Additional Comments: A venous catheter is visualized in the right ventricle. Spectral Doppler performed. LEFT VENTRICLE PLAX 2D LVOT diam:     2.00 cm LV SV:         71 LV SV Index:   33 LVOT Area:     3.14 cm  AORTIC VALVE AV Area (Vmax):    2.18 cm AV Area (Vmean):   2.57 cm AV Area (VTI):     2.43 cm AV Vmax:           179.00 cm/s AV Vmean:          113.000 cm/s AV VTI:            0.292 m AV Peak Grad:      12.8 mmHg AV Mean Grad:      6.0 mmHg LVOT Vmax:         124.00 cm/s LVOT Vmean:        92.300 cm/s LVOT VTI:          0.226 m LVOT/AV VTI ratio: 0.77 AI PHT:            515 msec  SHUNTS Systemic VTI:  0.23 m Systemic Diam: 2.00 cm Rachelle Hora Croitoru MD Electronically signed by Thurmon Fair MD Signature Date/Time: 02/04/2023/6:09:39 PM    Final    DG CHEST PORT 1 VIEW  Result Date: 02/04/2023 CLINICAL DATA:  Management for intra-aortic balloon pump EXAM: PORTABLE CHEST 1 VIEW COMPARISON:  02/03/2023 FINDINGS: ETT tip is 1.5 cm above the carina. There is an enteric tube with tip and side port in the stomach. Left IJ pulmonary arterial catheter is noted with tip in the distal right main pulmonary artery. Intra-aortic balloon pump marker is identified at the T6-7 level. On the previous exam this was at the T6-7 level. Stable cardiomediastinal contours. Moderate left pleural effusion appears increased in volume from previous exam. Resolution of previous right base atelectasis. IMPRESSION: 1. Intra-aortic balloon pump marker is identified at the T6-7 level. Unchanged from previous exam. 2. Moderate left pleural effusion appears increased in volume from previous exam. 3. Resolution of previous right base atelectasis.  Electronically Signed   By: Signa Kell M.D.   On: 02/04/2023 10:26   DG CHEST PORT 1 VIEW  Result Date: 02/03/2023 CLINICAL DATA:  Heart failure EXAM: PORTABLE CHEST 1 VIEW COMPARISON:  Same day chest radiograph FINDINGS: Endotracheal tube, enteric tube, right IJ approach pulmonary arterial catheter, and IABP marker are stable in positioning from chest radiograph performed earlier on the same day. Stable heart size. Pulmonary vascular congestion. Small left pleural effusion. Increasing streaky bibasilar opacities. No pneumothorax. IMPRESSION: 1. Increasing streaky bibasilar opacities, likely atelectasis. 2. Small left pleural effusion. 3. Stable support apparatus. Electronically Signed   By: Duanne Guess D.O.   On: 02/03/2023 13:20   DG Chest Port 1 View  Result Date: 02/03/2023 CLINICAL DATA:  Intra-aortic balloon pump. EXAM: PORTABLE CHEST 1 VIEW COMPARISON:  02/02/2023 FINDINGS: The cardio pericardial silhouette is enlarged. Low volume film. Interval increase in streaky opacity in the right mid lung, potentially atelectasis. Left base atelectasis or infiltrate with small effusion is similar to prior. Endotracheal tube tip is approximately 3.4 cm above the carina. The NG tube passes into the stomach although the distal tip position is not included on the film. Right IJ pulmonary artery catheter tip is in the interlobar pulmonary artery. Left IJ central line tip overlies the mid SVC level. The tip of the intra-aortic balloon pump overlies the proximal descending thoracic aorta. Telemetry leads overlie the chest. IMPRESSION: 1. Low volume film with interval increase in streaky opacity in the right mid lung, potentially atelectasis. 2. Persistent left base atelectasis or infiltrate with small effusion. 3. Support apparatus as described. Electronically Signed   By: Kennith Center M.D.   On: 02/03/2023 07:56   CARDIAC CATHETERIZATION  Result Date: 02/02/2023 Images from the original result were not  included. Finals set of RHC numbers on milrinone 0.375 mics, norepinephrine at 8 mic/min CO: 3.5 L/min CI: 1.7 L/min/m2 Elder Negus, MD Pager: [phone] Office: [phone]  DG Abd 1 View  Result Date: 02/02/2023 CLINICAL DATA:  Orogastric tube placement. EXAM: ABDOMEN - 1 VIEW COMPARISON:  CT 01/28/2023 FINDINGS: Limited field of view for tube placement evaluation. An enteric tube has been placed with tip projecting in the mid abdomen to the right of midline consistent with location in the distal stomach. There is evidence of left pleural effusion with basilar atelectasis or infiltration. IMPRESSION: Enteric tube tip projects over the right mid abdomen consistent with location in the distal stomach. Electronically Signed   By: Burman Nieves M.D.   On: 02/02/2023 17:46   DG CHEST PORT 1 VIEW  Result Date: 02/02/2023 CLINICAL DATA:  [id number] Dyspnea [id number] EXAM: PORTABLE CHEST - 1 VIEW COMPARISON:  02/01/2023 FINDINGS: Endotracheal tube has been placed, tip 5.3 cm above carina. Right IJ central line is been placed to the right pulmonary artery. IABP tip projects just below the aortic arch. Low lung volumes with patchy infiltrate or atelectasis at the left lung base as before. Heart size and mediastinal contours are within normal limits. Aortic Atherosclerosis (ICD10-170.0). Probable small left pleural effusion stable. Surgical clips right axilla. IMPRESSION: 1. Support devices as above. 2. Persistent left basilar infiltrate or atelectasis and small left effusion. Electronically Signed   By: Corlis Leak M.D.   On: 02/02/2023 07:50   CARDIAC CATHETERIZATION  Result Date: 02/02/2023 Images from the original result were not included. 1. Ultrasound guided right common femoral artery access 2. Intraaortic balloon pump placement In case of recurrent VT/VT or hemodynamic decompensation, will consider MCS escalation. Elder Negus, MD Pager: [phone] Office: [phone]  CARDIAC  CATHETERIZATION  Addendum Date: 02/02/2023     Aggressive GDMT for HFrEF Right and left heart catheterization 02/02/2023: LM: Normal LAD: Patnet mid LAD stent with no restenosis.stent thrombosis Lcx: Mid diffuse 50% disease Ramus: Patent stent. No restenosis RCA: Mid eccentric 40% disease. Rest mild diffuse disease  RA: 21 mmHg PA: 50/29 mmHg, mPAP 38 mmHg PCW: 29 mmHg  LV: 127/12 mmHg Ao: 102/77 mmHg CO: 5.5 L/min CI: 2.7 L/min/m2 Swan catheter left in place. IV lasix 20X2 administered in cath lab. Continue IV amiodarone. Needs aggressive HFrEF management. I had a long conversation with patient and family regarding importance of HFrEF management. Will get heart failure team consult. Elder Negus, MD Pager: [phone] Office: [phone]    Result Date: 02/02/2023  Images from the original result were not included.   Aggressive GDMT for HFrEF Right and left heart catheterization 02/02/2023: LM: Normal LAD: Patnet mid LAD stent with no restenosis.stent thrombosis Lcx: Mid diffuse 50% disease Ramus: Patent stent. No restenosis RCA: Mid eccentric 40% disease. Rest mild diffuse disease  RA: 21 mmHg PA: 50/29 mmHg, mPAP 38 mmHg PCW: 29 mmHg  LV: 127/12 mmHg Ao: 102/77 mmHg CO: 5.5 L/min CI: 2.7 L/min/m2 Swan catheter left in place. IV lasix 20X2 administered in cath lab. Needs aggressive HFrEF management. I had a long conversation with patient and family regarding importance of HFrEF management. Will get heart failure team consult. Elder Negus, MD Pager: [phone] Office: [phone]    DG CHEST PORT 1 VIEW  Result Date: 02/01/2023 CLINICAL DATA:  Heart abnormality. Rapid response/CPR initiated. Right basilar opacities and pleural effusion. EXAM: PORTABLE CHEST 1 VIEW COMPARISON:  01/31/2023 FINDINGS: Cardiac enlargement. Small left pleural effusion with basilar infiltration or atelectasis. Infiltrates are increasing since the prior study, possibly indicating compressive atelectasis or  pneumonia. No developing consolidation on the right. Calcification of the aorta. Surgical clips in the right chest. No pneumothorax. IMPRESSION: Small but increasing left pleural effusion and basilar infiltration or atelectasis. Electronically Signed   By: Burman Nieves M.D.   On: 02/01/2023 23:41   ECHOCARDIOGRAM COMPLETE  Result Date: 02/01/2023    ECHOCARDIOGRAM REPORT   Patient Name:   Madison West Date of Exam: 02/01/2023 Medical Rec #:  [id number]           Height:       68.0 in Accession #:    [id number]          Weight:       205.2 lb Date of Birth:  19-Jul-1955           BSA:          2.066 m Patient Age:    67 years            BP:           108/67 mmHg Patient Gender: F                   HR:           90 bpm. Exam Location:  Inpatient Procedure: 2D Echo, Cardiac Doppler, Color Doppler and Intracardiac            Opacification Agent Indications:    Cardiac arrest I46.9  History:        Patient has prior history of Echocardiogram examinations, most                 recent 01/25/2023. Aortic Valve Disease; Risk                 Factors:Hypertension.  Sonographer:    Darlys Gales Referring Phys: [id number] Beulah Gandy PAYNE IMPRESSIONS  1. Left ventricular ejection fraction, by estimation, is 20 to 25%. The left ventricle has severely decreased function. The left ventricle demonstrates regional wall motion abnormalities (see scoring diagram/findings for description). The left ventricular internal cavity size was moderately dilated. There is mild concentric left ventricular hypertrophy. Left ventricular diastolic parameters are indeterminate.  2. Right ventricular systolic function is normal. The right ventricular size is normal. Tricuspid regurgitation signal is inadequate for assessing PA pressure.  3. Left atrial size was moderately dilated.  4. The mitral valve is normal in structure. No evidence of mitral valve regurgitation. No evidence of mitral stenosis.  5.  The aortic valve is calcified. Aortic valve  regurgitation is not visualized. Mild to moderate aortic valve stenosis. Aortic valve area, by VTI measures 1.10 cm. Aortic valve mean gradient measures 24.5 mmHg. Aortic valve Vmax measures 2.96 m/s.  6. The inferior vena cava is normal in size with greater than 50% respiratory variability, suggesting right atrial pressure of 3 mmHg. FINDINGS  Left Ventricle: Left ventricular ejection fraction, by estimation, is 20 to 25%. The left ventricle has severely decreased function. The left ventricle demonstrates regional wall motion abnormalities. Definity contrast agent was given IV to delineate the left ventricular endocardial borders. The left ventricular internal cavity size was moderately dilated. There is mild concentric left ventricular hypertrophy. Left ventricular diastolic parameters are indeterminate.  LV Wall Scoring: The anterior septum and apex are akinetic. The entire anterior wall, antero-lateral wall, inferior septum, entire inferior wall, and apical lateral segment are hypokinetic. Right Ventricle: The right ventricular size is normal. No increase in right ventricular wall thickness. Right ventricular systolic function is normal. Tricuspid regurgitation signal is inadequate for assessing PA pressure. Left Atrium: Left atrial size was moderately dilated. Right Atrium: Right atrial size was normal in size. Pericardium: There is no evidence of pericardial effusion. Mitral Valve: The mitral valve is normal in structure. No evidence of mitral valve regurgitation. No evidence of mitral valve stenosis. Tricuspid Valve: The tricuspid valve is normal in structure. Tricuspid valve regurgitation is not demonstrated. No evidence of tricuspid stenosis. Aortic Valve: The aortic valve is calcified. Aortic valve regurgitation is not visualized. Mild to moderate aortic stenosis is present. Aortic valve mean gradient measures 24.5 mmHg. Aortic valve peak gradient measures 35.0 mmHg. Aortic valve area, by VTI measures  1.10 cm. Pulmonic Valve: The pulmonic valve was normal in structure. Pulmonic valve regurgitation is not visualized. No evidence of pulmonic stenosis. Aorta: The aortic root is normal in size and structure. Venous: The inferior vena cava is normal in size with greater than 50% respiratory variability, suggesting right atrial pressure of 3 mmHg. IAS/Shunts: No atrial level shunt detected by color flow Doppler.  LEFT VENTRICLE PLAX 2D LVIDd:         5.50 cm   Diastology LVIDs:         5.20 cm   LV e' medial:    9.14 cm/s LV PW:         1.20 cm   LV E/e' medial:  8.9 LV IVS:        1.10 cm   LV e' lateral:   4.13 cm/s LVOT diam:     1.80 cm   LV E/e' lateral: 19.7 LV SV:         70 LV SV Index:   34 LVOT Area:     2.54 cm  RIGHT VENTRICLE             IVC RV S prime:     12.00 cm/s  IVC diam: 2.50 cm TAPSE (M-mode): 3.4 cm LEFT ATRIUM             Index        RIGHT ATRIUM          Index LA Vol (A2C):   79.7 ml 38.57 ml/m  RA Area:     9.99 cm LA Vol (A4C):   86.3 ml 41.76 ml/m  RA Volume:   17.90 ml 8.66 ml/m LA Biplane Vol: 87.1 ml 42.15 ml/m  AORTIC VALVE AV Area (Vmax):    1.07 cm AV Area (Vmean):  0.98 cm AV Area (VTI):     1.10 cm AV Vmax:           296.00 cm/s AV Vmean:          242.500 cm/s AV VTI:            0.638 m AV Peak Grad:      35.0 mmHg AV Mean Grad:      24.5 mmHg LVOT Vmax:         125.00 cm/s LVOT Vmean:        93.100 cm/s LVOT VTI:          0.275 m LVOT/AV VTI ratio: 0.43 MITRAL VALVE MV Area (PHT): 4.33 cm     SHUNTS MV Decel Time: 175 msec     Systemic VTI:  0.28 m MV E velocity: 81.20 cm/s   Systemic Diam: 1.80 cm MV A velocity: 133.00 cm/s MV E/A ratio:  0.61 Kardie Tobb DO Electronically signed by Thomasene Ripple DO Signature Date/Time: 02/01/2023/11:38:32 AM    Final    CARDIAC CATHETERIZATION  Result Date: 01/31/2023 Images from the original result were not included. LM: Normal LAD: 95% mid LAD stenosis with mild calcification and moderate tortuosity (New since 2021)          40%  downstream lesion in mid LAD (New since 2021)          Otherwise mild diffuse disease Lcx: Mid diffuse 50% disease (Previously deemed 80% in 2021) Ramus: Patent stent. No restenosis RCA: Not engaged today Successful percutaneous coronary intervention mid LAD     Aspiration thrombectomy     IVUS guided balloon dilatation with 3.25 and 3.5 mm [place] balloons up to 20 atm 0% residual stenosis at the stented segment. IVUS MSA 8.1 mm2, prior to final 3.5 [place] balloon expansion at 20 atm Elder Negus, MD Pager: [phone] Office: [phone]   DG Chest Port 1 View  Result Date: 01/31/2023 CLINICAL DATA:  Post cardiac event EXAM: PORTABLE CHEST 1 VIEW COMPARISON:  Chest radiograph dated 01/23/2023 FINDINGS: Right chest wall surgical clips. Normal lung volumes. Patchy right basilar opacities. Blunting of the left costophrenic angle. No pneumothorax. The heart size and mediastinal contours are within normal limits. No acute osseous abnormality. IMPRESSION: 1. Patchy right basilar opacities, likely atelectasis. 2. Blunting of the left costophrenic angle, which may represent a small pleural effusion. Electronically Signed   By: Agustin Cree M.D.   On: 01/31/2023 20:44   CARDIAC CATHETERIZATION  Addendum Date: 01/30/2023   Coronary intervention 01/29/2023: (Please see diagnostic coronary angiogram report 01/27/2023) Mid LAD 95% stenosis Successful percutaneous coronary intervention mid LAD        PTCA and stent placement 3.0 X 22 mm Onyx Frontier drug-eluting stent        Post dilatation using 3.25 X 12 mm [place] balloon up tp 18 atm 0% residual stenosis, TIMI flow III Elder Negus, MD Pager: [phone] Office: [phone]   Result Date: 01/30/2023 Images from the original result were not included. Coronary intervention 01/29/2023: (Please see diagnostic coronary angiogram report 01/27/2023) Mid LAD 95% stenosis Successful percutaneous coronary intervention mid LAD        PTCA and stent placement 3.0 X 22 mm Onyx  Frontier drug-eluting stent        Post dilatation using 3.25 X 12 mm  balloon up tp 18 atm 0% residual stenosis, TIMI flow III Elder Negus, MD Pager: [phone] Office: [phone]  DG Orthopantogram  Result Date: 01/29/2023 CLINICAL DATA:  Poor dentition. EXAM: ORTHOPANTOGRAM/PANORAMIC COMPARISON:  None Available. FINDINGS: A large dental caries is present in the residual first right maxillary molar, tooth 3. Metallic fillings are present bilaterally. No periapical lucencies are present. Root canals are present in teeth numbers 20, 28, 30 and 31. Mandible is unremarkable.  Visualized paranasal sinuses are clear. IMPRESSION: Large dental caries in the residual first right maxillary molar, tooth 3. Multiple metallic fillings and root canals as described. Electronically Signed   By: Marin Roberts M.D.   On: 01/29/2023 17:56   CT CORONARY MORPH W/CTA COR W/SCORE W/CA W/CM &/OR WO/CM  Addendum Date: 01/28/2023   ADDENDUM REPORT: 01/28/2023 17:01 CLINICAL DATA:  Aortic stenosis EXAM: Cardiac TAVR CT TECHNIQUE: The patient was scanned on a Siemens Force 192 slice scanner. A 120 kV retrospective scan was triggered in the descending thoracic aorta at 111 HU's. Gantry rotation speed was 270 msecs and collimation was .9 mm. No beta blockade or nitro were given. The 3D data set was reconstructed in 5% intervals of the R-R cycle. Systolic and diastolic phases were analyzed on a dedicated work station using MPR, MIP and VRT modes. The patient received 80 cc of contrast. FINDINGS: Aortic Valve: Tri leaflet aortic valve calcified with restricted motion Calcium Score 2137 Large area of bulky calcification involving the non coronary cusp Aorta: No aneurysm moderate calcific atherosclerosis Sinotubular Junction: 23 mm Ascending Thoracic Aorta: 32 mm Aortic Arch: 30 mm Descending Thoracic Aorta: 23 mm Sinus of Valsalva Measurements: Non-coronary: 30 mm   height 21 mm Right - coronary: 29.4 mm  height 19 mm  Left - coronary: 28.1 mm   height 20.5 mm Coronary Artery Height above Annulus: Left Main: 13.9 mm above annulus Right Coronary: 15.3 mm above annulus Virtual Basal Annulus Measurements: Maximum/Minimum Diameter: 25.1 mm x 22.3 mm Average diameter 23.7 mm Perimeter: 75.2 mm Area: 440 mm2 Coronary Arteries: Sufficient height above annulus for deployment Optimum Fluoroscopic Angle for Delivery: LAO 1 Caudal 2 degrees Membranous septal length 5.3 mm IMPRESSION: 1. Calcified tri leaflet AV with score 2137 Bulky calcium noted on non coronary cusp 2. Annular area 440 mm2 suitable on lower end for 26 mm Sapien Ultra valve. Left coronary sinus small to consider 29 mm Medtronic Evolut 3.  Optimum angiographic angle for deployment LAO 1 Caudal 2 degrees 4.  Membranous septal length 5.3 mm 5.  Coronary arteries sufficient height above annulus for deployment Charlton Haws Electronically Signed   By: Charlton Haws M.D.   On: 01/28/2023 17:01   Result Date: 01/28/2023 EXAM: OVER-READ INTERPRETATION  CT CHEST The following report is a limited chest CT over-read performed by radiologist Dr. Allegra Lai of [facility] on 01/28/2023. This over-read does not include interpretation of cardiac or coronary anatomy or pathology. The cardiac TAVR interpretation by the cardiologist is attached. COMPARISON:  None Available. FINDINGS: Extracardiac findings will be described separately under dictation for contemporaneously obtained CTA chest, abdomen and pelvis. IMPRESSION: Please see separate dictation for contemporaneously obtained CTA chest, abdomen and pelvis dated 01/28/2023 for full description of relevant extracardiac findings. Electronically Signed: By: Allegra Lai M.D. On: 01/28/2023 16:24   CT Angio Abd/Pel w/ and/or w/o  Result Date: 01/28/2023 CLINICAL DATA:  Aortic valve replacement preop evaluation EXAM: CT ANGIOGRAPHY CHEST, ABDOMEN AND PELVIS TECHNIQUE: Multidetector CT imaging through the chest, abdomen  and pelvis was performed using the standard protocol during bolus administration of intravenous contrast. Multiplanar reconstructed images and MIPs were obtained and reviewed to evaluate the vascular  anatomy. RADIATION DOSE REDUCTION: This exam was performed according to the departmental dose-optimization program which includes automated exposure control, adjustment of the mA and/or kV according to patient size and/or use of iterative reconstruction technique. CONTRAST:  OMNIPAQUE IOHEXOL 350 MG/ML SOLN COMPARISON:  Abdominal CT dated February 20, 2019; chest CTA dated January 24, 2019 FINDINGS: CTA CHEST FINDINGS Cardiovascular: Normal heart size. No pericardial effusion. Aortic valve thickening and calcifications. Normal caliber thoracic aorta with moderate atherosclerotic disease. Coronary artery calcifications of the LAD and RCA. Evidence of prior PCI of the first diagonal. Mild mitral annular calcifications. Mediastinum/Nodes: Esophagus and thyroid are unremarkable. Surgical clips of the right axilla. Mild pericardial recess fluid. No enlarged lymph nodes seen in the chest. Lungs/Pleura: Central airways are patent. Mild centrilobular emphysema. No consolidation, pleural effusion or pneumothorax. Musculoskeletal: No aggressive appearing osseous lesions CTA ABDOMEN AND PELVIS FINDINGS Hepatobiliary: No focal liver abnormality is seen. No gallstones, gallbladder wall thickening, or biliary dilatation. Pancreas: Unremarkable. No pancreatic ductal dilatation or surrounding inflammatory changes. Spleen: Normal in size without focal abnormality. Adrenals/Urinary Tract: Multiple adenomas of the left-greater-than-right adrenal glands, unchanged when compared with the prior abdominal CT. No hydronephrosis. Bladder is unremarkable. Stomach/Bowel: Stomach is within normal limits. Severe diverticulosis. No evidence of bowel wall thickening, distention, or inflammatory changes. Vascular/lymphatic: Normal caliber abdominal  aorta with moderate atherosclerotic disease. No enlarged lymph nodes seen in the abdomen or pelvis. Reproductive: No adnexal masses. Other: Soft tissue stranding of the right groin, likely postprocedural. Surgical clips of the right breast. Musculoskeletal: Mild compression deformity of L1, likely chronic. Moderate degenerative disc disease of the lumbar spine with associated kyphotic curvature. No aggressive appearing osseous lesions. VASCULAR MEASUREMENTS PERTINENT TO TAVR: AORTA: Minimal Aortic Diameter-17.0 mm Severity of Aortic Calcification-moderate RIGHT PELVIS: Right Common Iliac Artery - Minimal Diameter-7.1 mm Tortuosity-mild Calcification-severe Right External Iliac Artery - Minimal Diameter-8.4 mm Tortuosity-mild Calcification-none Right Common Femoral Artery - Minimal Diameter-6.9 mm Tortuosity-none Calcification-mild LEFT PELVIS: Left Common Iliac Artery - Minimal Diameter-7.5 mm Tortuosity-mild Calcification-severe Left External Iliac Artery - Minimal Diameter-8.8 mm Tortuosity-mild Calcification-none Left Common Femoral Artery - Minimal Diameter-4.9 mm Tortuosity-none Calcification-moderate Review of the MIP images confirms the above findings. IMPRESSION: 1. Vascular findings and measurements pertinent to potential TAVR procedure, as detailed above. 2. Thickening and calcification of the aortic valve, compatible with reported clinical history of aortic stenosis. 3. Moderate to severe aortoiliac atherosclerosis. Coronary artery calcifications of the LAD and RCA. Electronically Signed   By: Allegra Lai M.D.   On: 01/28/2023 16:51   CT ANGIO CHEST AORTA W/CM & OR WO/CM  Result Date: 01/28/2023 CLINICAL DATA:  Aortic valve replacement preop evaluation EXAM: CT ANGIOGRAPHY CHEST, ABDOMEN AND PELVIS TECHNIQUE: Multidetector CT imaging through the chest, abdomen and pelvis was performed using the standard protocol during bolus administration of intravenous contrast. Multiplanar reconstructed images  and MIPs were obtained and reviewed to evaluate the vascular anatomy. RADIATION DOSE REDUCTION: This exam was performed according to the departmental dose-optimization program which includes automated exposure control, adjustment of the mA and/or kV according to patient size and/or use of iterative reconstruction technique. CONTRAST:  OMNIPAQUE IOHEXOL 350 MG/ML SOLN COMPARISON:  Abdominal CT dated February 20, 2019; chest CTA dated January 24, 2019 FINDINGS: CTA CHEST FINDINGS Cardiovascular: Normal heart size. No pericardial effusion. Aortic valve thickening and calcifications. Normal caliber thoracic aorta with moderate atherosclerotic disease. Coronary artery calcifications of the LAD and RCA. Evidence of prior PCI of the first diagonal. Mild mitral annular calcifications. Mediastinum/Nodes: Esophagus  and thyroid are unremarkable. Surgical clips of the right axilla. Mild pericardial recess fluid. No enlarged lymph nodes seen in the chest. Lungs/Pleura: Central airways are patent. Mild centrilobular emphysema. No consolidation, pleural effusion or pneumothorax. Musculoskeletal: No aggressive appearing osseous lesions CTA ABDOMEN AND PELVIS FINDINGS Hepatobiliary: No focal liver abnormality is seen. No gallstones, gallbladder wall thickening, or biliary dilatation. Pancreas: Unremarkable. No pancreatic ductal dilatation or surrounding inflammatory changes. Spleen: Normal in size without focal abnormality. Adrenals/Urinary Tract: Multiple adenomas of the left-greater-than-right adrenal glands, unchanged when compared with the prior abdominal CT. No hydronephrosis. Bladder is unremarkable. Stomach/Bowel: Stomach is within normal limits. Severe diverticulosis. No evidence of bowel wall thickening, distention, or inflammatory changes. Vascular/lymphatic: Normal caliber abdominal aorta with moderate atherosclerotic disease. No enlarged lymph nodes seen in the abdomen or pelvis. Reproductive: No adnexal masses. Other:  Soft tissue stranding of the right groin, likely postprocedural. Surgical clips of the right breast. Musculoskeletal: Mild compression deformity of L1, likely chronic. Moderate degenerative disc disease of the lumbar spine with associated kyphotic curvature. No aggressive appearing osseous lesions. VASCULAR MEASUREMENTS PERTINENT TO TAVR: AORTA: Minimal Aortic Diameter-17.0 mm Severity of Aortic Calcification-moderate RIGHT PELVIS: Right Common Iliac Artery - Minimal Diameter-7.1 mm Tortuosity-mild Calcification-severe Right External Iliac Artery - Minimal Diameter-8.4 mm Tortuosity-mild Calcification-none Right Common Femoral Artery - Minimal Diameter-6.9 mm Tortuosity-none Calcification-mild LEFT PELVIS: Left Common Iliac Artery - Minimal Diameter-7.5 mm Tortuosity-mild Calcification-severe Left External Iliac Artery - Minimal Diameter-8.8 mm Tortuosity-mild Calcification-none Left Common Femoral Artery - Minimal Diameter-4.9 mm Tortuosity-none Calcification-moderate Review of the MIP images confirms the above findings. IMPRESSION: 1. Vascular findings and measurements pertinent to potential TAVR procedure, as detailed above. 2. Thickening and calcification of the aortic valve, compatible with reported clinical history of aortic stenosis. 3. Moderate to severe aortoiliac atherosclerosis. Coronary artery calcifications of the LAD and RCA. Electronically Signed   By: Allegra Lai M.D.   On: 01/28/2023 16:51   CARDIAC CATHETERIZATION  Result Date: 01/27/2023 Images from the original result were not included. LM: Normal LAD: 95% mid LAD stenosis with mild calcification and moderate tortuosity (New since 2021)          40% downstream lesion in mid LAD (New since 2021)          Otherwise mild diffuse disease Lcx: Mid diffuse 50% disease (Previously deemed 80% in 2021) Ramus: Patent stent. No restenosis RCA: Mid eccentric 40% disease. 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 and septum on echocardiogram) Probable low flow low gradient severe AS GDMT for HFrEF likely limited by low blood pressure in the setting of severe AS Will discuss with multidisciplinary heart team re: GDMT for HFrEF + PCI and TAVR vs CABG (LIMA-LAD) + AVR Elder Negus, MD Pager: [phone] Office: [phone]   ECHOCARDIOGRAM COMPLETE  Result Date: 01/27/2023    ECHOCARDIOGRAM REPORT   Patient Name:   [facility] Date of Exam: 01/25/2023 Medical Rec #:  [id number]           Height:       68.0 in Accession #:    [id number]          Weight:       206.3 lb  Date of Birth:  May 18, 1956           BSA:          2.071 m Patient Age:    44 years            BP:           111/78 mmHg Patient Gender: F                   HR:           92 bpm. Exam Location:  Inpatient Procedure: 2D Echo, Color Doppler and Cardiac Doppler Indications:     acute systolic chf  History:         Patient has prior history of Echocardiogram examinations, most                  recent 03/08/2018. CHF, CAD, history of breast cancer,                  Signs/Symptoms:Dyspnea; Risk Factors:Hypertension.  Sonographer:     Delcie Roch RDCS Referring Phys:  [phone] MICHAEL E NORINS Diagnosing Phys: Tessa Lerner DO IMPRESSIONS  1. Left ventricular ejection fraction, by estimation, is 30 to 35%. The left ventricle has moderately decreased function. The left ventricle demonstrates regional wall motion abnormalities (see scoring diagram/findings for description). There is mild left ventricular hypertrophy. Indeterminate diastolic filling due to E-A fusion. The entire septum and apex are hypokinetic.  2. Right ventricular systolic function is low normal. The right ventricular size is  normal.  3. The mitral valve is grossly normal. No evidence of mitral valve regurgitation.  4. Native valve, moderate to severe calcification, reduced leaflet excursion, trace aortic regurgitation, mild to moderate aortic stenosis (peak velocity 2.35m/s, Peak Gradient 33 mmHg, Mean gradient 20 mmHg, AVA VTI 0.93cm2, DI 0.33, SVi 25cc (severity  of aortic stenosis is likely underestimated due to low flow - low gradient AS).  5. The inferior vena cava is normal in size with greater than 50% respiratory variability, suggesting right atrial pressure of 3 mmHg.  6. Rhythm strip during this exam demonstrates normal sinus rhythm. Comparison(s): Prior study 05/25/2022: LVEF 55-60%, Grade I diastolic dysfunction, mild to moderate AS, see report for more details. FINDINGS  Left Ventricle: Left ventricular ejection fraction, by estimation, is 30 to 35%. The left ventricle has moderately decreased function. The left ventricle demonstrates regional wall motion abnormalities. The left ventricular internal cavity size was normal in size. There is mild left ventricular hypertrophy. Indeterminate diastolic filling due to E-A fusion. The entire septum and apex are hypokinetic.  LV Wall Scoring: The entire septum and apex are hypokinetic. Right Ventricle: The right ventricular size is normal. No increase in right ventricular wall thickness. Right ventricular systolic function is low normal. Left Atrium: Left atrial size was normal in size. Right Atrium: Right atrial size was normal in size. Pericardium: There is no evidence of pericardial effusion. Mitral Valve: The mitral valve is grossly normal. No evidence of mitral valve regurgitation. Tricuspid Valve: The tricuspid valve is normal in structure. Tricuspid valve regurgitation is not demonstrated. No evidence of tricuspid stenosis. Aortic Valve: Native valve, moderate to severe calcification, reduced leaflet excursion, trace aortic regurgitation, mild to moderate aortic stenosis  (peak velocity 2.21m/s, Peak Gradient 33 mmHg, Mean gradient 20 mmHg, AVA VTI 0.93cm2, DI 0.33, SVi 25cc  (severity of aortic stenosis is likely underestimated due to low flow - low gradient AS). Aortic valve mean gradient measures 20.0 mmHg. Aortic valve  peak gradient measures 33.4 mmHg. Aortic valve area, by VTI measures 0.93 cm. Pulmonic Valve: The pulmonic valve was not well visualized. Pulmonic valve regurgitation is not visualized. No evidence of pulmonic stenosis. Aorta: The aortic root is normal in size and structure and the ascending aorta was not well visualized. Venous: The inferior vena cava is normal in size with greater than 50% respiratory variability, suggesting right atrial pressure of 3 mmHg. IAS/Shunts: The interatrial septum was not well visualized. EKG: Rhythm strip during this exam demonstrates normal sinus rhythm.  LEFT VENTRICLE PLAX 2D LVIDd:         4.70 cm      Diastology LVIDs:         4.10 cm      LV e' medial:  5.00 cm/s LV PW:         1.20 cm      LV e' lateral: 3.92 cm/s LV IVS:        1.20 cm LVOT diam:     1.90 cm LV SV:         52 LV SV Index:   25 LVOT Area:     2.84 cm  LV Volumes (MOD) LV vol d, MOD A2C: 122.0 ml LV vol s, MOD A2C: 88.3 ml LV SV MOD A2C:     33.7 ml RIGHT VENTRICLE            IVC RV Basal diam:  2.40 cm    IVC diam: 1.60 cm RV S prime:     9.79 cm/s TAPSE (M-mode): 2.0 cm LEFT ATRIUM             Index        RIGHT ATRIUM           Index LA diam:        3.70 cm 1.79 cm/m   RA Area:     11.70 cm LA Vol (A2C):   67.9 ml 32.79 ml/m  RA Volume:   24.80 ml  11.98 ml/m LA Vol (A4C):   48.6 ml 23.47 ml/m LA Biplane Vol: 61.2 ml 29.55 ml/m  AORTIC VALVE AV Area (Vmax):    1.02 cm AV Area (Vmean):   0.93 cm AV Area (VTI):     0.93 cm AV Vmax:           289.00 cm/s AV Vmean:          212.000 cm/s AV VTI:            0.557 m AV Peak Grad:      33.4 mmHg AV Mean Grad:      20.0 mmHg LVOT Vmax:         104.30 cm/s LVOT Vmean:        69.500 cm/s LVOT VTI:           0.182 m LVOT/AV VTI ratio: 0.33  AORTA Ao Root diam: 2.90 cm  SHUNTS Systemic VTI:  0.18 m Systemic Diam: 1.90 cm Sunit Tolia DO Electronically signed by Tessa Lerner DO Signature Date/Time: 01/27/2023/12:12:40 AM    Final    CT Angio Chest PE W and/or Wo Contrast  Result Date: 01/24/2023 CLINICAL DATA:  Pulmonary embolism suspected, high probability. EXAM: CT ANGIOGRAPHY CHEST WITH CONTRAST TECHNIQUE: Multidetector CT imaging of the chest was performed using the standard protocol during bolus administration of intravenous contrast. Multiplanar CT image reconstructions and MIPs were obtained to evaluate the vascular anatomy. RADIATION DOSE REDUCTION: This exam was performed according to the departmental dose-optimization  program which includes automated exposure control, adjustment of the mA and/or kV according to patient size and/or use of iterative reconstruction technique. CONTRAST:  75mL OMNIPAQUE IOHEXOL 350 MG/ML SOLN COMPARISON:  10/16/2022. FINDINGS: Cardiovascular: Heart is normal in size and there is a trace pericardial effusion. Multi-vessel coronary artery calcifications are noted. There is atherosclerotic calcification of the aorta without evidence of aneurysm. The pulmonary trunk is normal in caliber. No evidence of pulmonary embolism. Mediastinum/Nodes: Enlarged lymph nodes are present in the mediastinum measuring up to 1.5 cm in the subcarinal space. Enlarged hilar lymph nodes are present bilaterally measuring up to 1.4 cm on the right. No axillary lymphadenopathy. Surgical clips are present in the right axilla. The thyroid gland, trachea, and esophagus are within normal limits. Lungs/Pleura: Mild centrilobular emphysematous changes are present in the lungs. Bronchial wall thickening and atelectasis are present bilaterally. No effusion or pneumothorax. Upper Abdomen: There are bilateral adrenal nodules, likely adenomas as seen on multiple prior exams. No acute abnormality. Musculoskeletal: Surgical  changes are present in the right breast. Degenerative changes are present in the thoracic spine. A stable compression deformity is noted in the superior endplate at L1. Review of the MIP images confirms the above findings. IMPRESSION: 1. No evidence of pulmonary embolism. 2. Mild bronchial wall thickening bilaterally with atelectasis, which may be infectious or inflammatory. 3. Nonspecific mediastinal and hilar lymphadenopathy, possibly reactive. Emphysema. 4. Coronary artery calcifications. 5. Aortic atherosclerosis. Electronically Signed   By: Thornell Sartorius M.D.   On: 01/24/2023 00:39   DG Chest 2 View  Result Date: 01/23/2023 CLINICAL DATA:  Cough. EXAM: CHEST - 2 VIEW COMPARISON:  Radiograph 01/11/2019 FINDINGS: The heart is normal in size. Atherosclerosis of the thoracic aorta. Mild peribronchial thickening without focal airspace disease. No pleural effusion or pneumothorax. No pulmonary edema. Right axillary surgical clips. No acute osseous findings. IMPRESSION: Mild peribronchial thickening without focal airspace disease. Electronically Signed   By: Narda Rutherford M.D.   On: 01/23/2023 22:19    Microbiology: No results found for this or any previous visit (from the past 240 hour(s)).   Labs: Basic Metabolic Panel: Recent Labs  Lab 02/14/23 0346 02/15/23 1036 02/16/23 0215 02/17/23 0500 02/18/23 0547  NA 129* 130* 132* 133* 134*  K 4.1 4.9 4.6 4.1 4.5  CL 90* 91* 91* 95* 95*  CO2 33* 32 33* 29 30  GLUCOSE 101* 127* 111* 115* 112*  BUN 21 15 19 18 17   CREATININE 0.83 0.72 0.75 0.78 0.88  CALCIUM 8.7* 9.0 8.9 9.0 9.3  MG  --   --   --   --  2.2  PHOS 4.4 4.1 5.0* 4.6 4.9*   Liver Function Tests: Recent Labs  Lab 02/14/23 0346 02/15/23 1036 02/16/23 0215 02/17/23 0500 02/18/23 0547  ALBUMIN 2.4* 2.6* 2.4* 2.7* 3.1*   No results for input(s): "LIPASE", "AMYLASE" in the last 168 hours. No results for input(s): "AMMONIA" in the last 168 hours. CBC: Recent Labs  Lab  02/14/23 0346 02/16/23 0215 02/17/23 0500  WBC 8.1 7.4 7.0  NEUTROABS 5.1  --   --   HGB 9.1* 8.3* 8.6*  HCT 28.2* 25.3* 26.4*  MCV 96.2 96.2 96.4  PLT 424* 469* 495*   Cardiac Enzymes: No results for input(s): "CKTOTAL", "CKMB", "CKMBINDEX", "TROPONINI" in the last 168 hours. BNP: BNP (last 3 results) Recent Labs    01/23/23 2208 02/14/23 0346  BNP 462.3* 418.7*    ProBNP (last 3 results) No results for input(s): "PROBNP" in the  last 8760 hours.  CBG: Recent Labs  Lab 02/16/23 2009 02/17/23 1130 02/17/23 1650 02/17/23 2051 02/18/23 1204  GLUCAP 106* 106* 104* 110* 111*       Signed:  Zannie Cove MD.  [facility] 02/18/2023, 12:12 PM

## 2023-02-18 NOTE — Progress Notes (Addendum)
Patient ID: Madison West, female   DOB: Mar 29, 1956, 67 y.o.   MRN: [id number]     Advanced Heart Failure Rounding Note  PCP-Cardiologist: None   Subjective:   - 8/15 s/p TAVR. Coronary angiogram w/ widely patent stent. Post op echo w/ no paravalvular leak. - Echo (8/16): EF 35-40%, mild LVH, WMAs, RV normal, bioprosthetic aortic valve s/p TAVR with mean gradient 13 mmH, trivial PVL, IVC mildly dilated.  - IABP removed 8/17 - extubated 8/18 -8/20 Completed abx for PNA -8/23 Milrinone stopped.  - 8/28  Repeat UE venous dopplers showed resolved LIJ DVT, still present superficial cephalic vein thrombosis.   Denies SOB. Anxious about going to CIR.   Objective:   Weight Range: 101.2 kg   Vital Signs:   Temp:  [97.7 F (36.5 C)-98.6 F (37 C)] 98.6 F (37 C) (08/29 0500) Pulse Rate:  [71-85] 78 (08/29 0500) Resp:  [16-19] 16 (08/29 0500) BP: (101-134)/(55-76) 101/61 (08/29 0500) SpO2:  [90 %-92 %] 90 % (08/29 0500) Weight:  [101.2 kg] 101.2 kg (08/29 0500) Last BM Date : 02/16/23  Weight change: Filed Weights   02/16/23 0900 02/17/23 0453 02/18/23 0500  Weight: 100.4 kg 100.4 kg 101.2 kg    Intake/Output:   Intake/Output Summary (Last 24 hours) at 02/18/2023 0947 Last data filed at 02/18/2023 0100 Gross per 24 hour  Intake 410 ml  Output 1200 ml  Net -790 ml    Physical Exam  General:   No resp difficulty HEENT: normal Neck: supple. no JVD. Carotids 2+ bilat; no bruits. No lymphadenopathy or thryomegaly appreciated. Cor: PMI nondisplaced. Regular rate & rhythm. No rubs, gallops or murmurs. Lungs: clear on 2 liters. [place]  Abdomen: soft, nontender, nondistended. No hepatosplenomegaly. No bruits or masses. Good bowel sounds. Extremities: no cyanosis, clubbing, rash, edema. LUE PICC  Neuro: alert & orientedx3, cranial nerves grossly intact. moves all 4 extremities w/o difficulty. Affect pleasant   Telemetry   SR 60s-70s  Labs    CBC Recent Labs     02/16/23 0215 02/17/23 0500  WBC 7.4 7.0  HGB 8.3* 8.6*  HCT 25.3* 26.4*  MCV 96.2 96.4  PLT 469* 495*   Basic Metabolic Panel Recent Labs    40/98/11 0500 02/18/23 0547  NA 133* 134*  K 4.1 4.5  CL 95* 95*  CO2 29 30  GLUCOSE 115* 112*  BUN 18 17  CREATININE 0.78 0.88  CALCIUM 9.0 9.3  MG  --  2.2  PHOS 4.6 4.9*   Liver Function Tests Recent Labs    02/17/23 0500 02/18/23 0547  ALBUMIN 2.7* 3.1*    No results for input(s): "LIPASE", "AMYLASE" in the last 72 hours. Cardiac Enzymes No results for input(s): "CKTOTAL", "CKMB", "CKMBINDEX", "TROPONINI" in the last 72 hours.  BNP: BNP (last 3 results) Recent Labs    01/23/23 2208 02/14/23 0346  BNP 462.3* 418.7*   ProBNP (last 3 results) No results for input(s): "PROBNP" in the last 8760 hours.  D-Dimer No results for input(s): "DDIMER" in the last 72 hours. Hemoglobin A1C No results for input(s): "HGBA1C" in the last 72 hours.  Fasting Lipid Panel No results for input(s): "CHOL", "HDL", "LDLCALC", "TRIG", "CHOLHDL", "LDLDIRECT" in the last 72 hours.  Thyroid Function Tests No results for input(s): "TSH", "T4TOTAL", "T3FREE", "THYROIDAB" in the last 72 hours.  Invalid input(s): "FREET3"  Other results:  Imaging    VAS [place] UPPER EXTREMITY VENOUS DUPLEX  Result Date: 02/17/2023 UPPER VENOUS STUDY  Patient Name:  Madison West  Date of Exam:   02/17/2023 Medical Rec #: [id number]            Accession #:    [id number] Date of Birth: September 03, 1955            Patient Gender: F Patient Age:   66 years Exam Location:  [facility] Procedure:      VAS [place] UPPER EXTREMITY VENOUS DUPLEX Referring Phys: Lillia Abed [facility] --------------------------------------------------------------------------------  Indications: Left forearm pain, indwelling PICC, recent examination positive for acute IJV DVT and cephalic SVT Limitations: Bandages. PICC line. Comparison Study: 02-10-2023 Left upper extremity venous study positive  for                   acute DVT involving the IJV and SVT involving the cephalic                   vein. Performing Technologist: Jean Rosenthal RDMS, RVT  Examination Guidelines: A complete evaluation includes B-mode imaging, spectral Doppler, color Doppler, and power Doppler as needed of all accessible portions of each vessel. Bilateral testing is considered an integral part of a complete examination. Limited examinations for reoccurring indications may be performed as noted.  Right Findings: +----------+------------+---------+-----------+----------+-------+ RIGHT     CompressiblePhasicitySpontaneousPropertiesSummary +----------+------------+---------+-----------+----------+-------+ Subclavian               Yes       Yes                      +----------+------------+---------+-----------+----------+-------+  Left Findings: +----------+------------+---------+-----------+-----------------+--------------+ LEFT      CompressiblePhasicitySpontaneous   Properties       Summary     +----------+------------+---------+-----------+-----------------+--------------+ IJV           Full       Yes       Yes                                    +----------+------------+---------+-----------+-----------------+--------------+ Subclavian               Yes       Yes                                    +----------+------------+---------+-----------+-----------------+--------------+ Axillary      Full       Yes       Yes                                    +----------+------------+---------+-----------+-----------------+--------------+ Brachial      Full                        High bifurcation,                                                              Limited                    +----------+------------+---------+-----------+-----------------+--------------+ Radial        Full                                                         +----------+------------+---------+-----------+-----------------+--------------+  Ulnar         Full                                                        +----------+------------+---------+-----------+-----------------+--------------+ Cephalic    Partial      No        No                           Age                                                                  Indeterminate  +----------+------------+---------+-----------+-----------------+--------------+ Basilic       Full                             Limited                    +----------+------------+---------+-----------+-----------------+--------------+  Summary:  Right: No evidence of thrombosis in the subclavian.  Left: No evidence of deep vein thrombosis in the upper extremity. Findings consistent with age indeterminate superficial vein thrombosis involving the left cephalic vein- essentially unchanged as compared to previous examination. Previously visualized internal jugular vein DVT appears resolved on today's examination.  *See table(s) above for measurements and observations.  Diagnosing physician: Sherald Hess MD Electronically signed by Sherald Hess MD on 02/17/2023 at 1:14:30 PM.    Final      Medications:   Scheduled Medications:  acetaminophen  650 mg Oral Q6H   ALPRAZolam  3 mg Oral BID   amiodarone  200 mg Oral BID   apixaban  5 mg Oral BID   aspirin  81 mg Oral Daily   busPIRone  10 mg Oral BID   Chlorhexidine Gluconate Cloth  6 each Topical Daily   clopidogrel  75 mg Oral Daily   dapagliflozin propanediol  10 mg Oral Daily   docusate sodium  100 mg Oral BID   feeding supplement  237 mL Oral BID BM   FLUoxetine  20 mg Oral QHS   gabapentin  300 mg Oral TID   lidocaine  1 patch Transdermal Q24H   melatonin  3 mg Oral Daily   methocarbamol  500 mg Oral BID   polyethylene glycol  17 g Oral BID   rosuvastatin  20 mg Oral Daily   sacubitril-valsartan  1 tablet Oral BID   senna-docusate   1 tablet Oral BID   sodium chloride flush  10-40 mL Intracatheter Q12H   sodium chloride flush  3 mL Intravenous Q12H   sorbitol  30 mL Oral Once   spironolactone  25 mg Oral Daily    Infusions:  sodium chloride     sodium chloride Stopped (02/08/23 0416)   sodium chloride     sodium chloride     sodium chloride      PRN Medications: sodium chloride, sodium chloride, Place/Maintain arterial line **AND** sodium chloride, ALPRAZolam, alum & mag hydroxide-simeth, bisacodyl, calcium carbonate, HYDROmorphone (DILAUDID) injection, hydrOXYzine, ipratropium-albuterol, lidocaine, lidocaine, nitroGLYCERIN, ondansetron (ZOFRAN)  IV, mouth rinse, mouth rinse, mouth rinse, oxyCODONE, sodium chloride flush, sodium chloride flush  Patient Profile  67 y.o. female with history of CAD s/p PCI to RCA in 2021, carotid artery stenosis s/p R CEA, aortic valve stenosis. Admitted with unstable angina. Found to have severe LAD stenosis s/p PCI and severe low flow low gradient AS. Coarse c/b refractory VT and hemodynamic instability >> cardiogenic shock. Assessment/Plan  Acute systolic CHF >> cardiogenic shock - In setting of severe ischemic heart disease, reduced EF, severe AS and refractory VT/VF - Echo 01/25/23: EF 30-35%, septum and apex HK, RV low normal, low flow low graident severe AS with mean gradient 20 mmHg, AVA 0.93 cm2, DI 0.33 - echo 02/01/23: EF 20-25%, RV okay, severe low flow low gradient severe AS with mean gradient 25 mmHg, AVA 1.1 cm2 - s/p PCI/stent to LAD. Stent thrombosis on 08/09 treated with thrombectomy.  - IABP placed 08/13. Had considered VA ECMO but hemodynamics improved with inotrope support - S/p TAVR 8/15 - Echo 8/16 with EF 35-40%, mild LVH, WMAs, RV normal, bioprosthetic aortic valve s/p TAVR with mean gradient 13 mmH, trivial PVL, IVC mildly dilated.  - IABP removed 8/17.  Stable off milrinone.  -Volume status stable.   - Continue lower dose of Entresto 49/51 mg BID - Continue  spiro 25 mg daily - Continue farxiga  - No BB for now with low output  2. Refractory VT - S/p multiple shocks.  - No further VT - continue amio 200 mg twice a day.  K stable.   3. CAD NSTEMI -HX PCI to RCA in 2021 -Presented with unstable angina. S/p PCI/stent to LAD on 08/07. Stent thrombosis 08/09 treated with thrombectomy -HS troponin > 24K on 08/12 -LHC 08/12 with patent LAD stent -LAD appeared widely patent on TAVR angiogram - No chest pain.  - Remains on DAPT with ASA 81 and Plavix for LAD PCI. Also on apixaban for left internal jugular DVT. Would continue triple therapy for now given history of stent thrombosis. After 1 month post-PCI, think we can drop ASA.   4. LFLG Severe AS -Noted on echo this admit -Not candidate for SAVR -s/p TAVR 8/15, valve stable on 8/15 echo with trivial PVL and low gradient.   5. Carotid artery stenosis -S/p R CEA in 2020  6. ID -Developed sepsis 08/13. PNA Completed antibiotics.   7. Respiratory failure - Extubated 8/18.  - Completed antibiotic course for pneumonia.  - Sats stable on 2 liters .  - Needs ongoing pulmonary toilet.   8. Hyponatremia -suspected hypervolemic hyponatremia -improved to 134 today - restrict free water  9. DVT left internal jugular - Continue eliquis, reducing to 5 mg BID today - Repeat UE venous dopplers showed resolved LIJ DVT, still present superficial cephalic vein thrombosis.   10. Anemia  Recent CBC stale.  -No obvious source of bleeding.   11. Left forearm swelling/tenderness --UE venous dopplers showed resolved LIJ DVT, still present superficial cephalic vein thrombosis.   Okay for discharge to CIR from HF standpoint.   Tonye Becket, NP  9:47 AM  Agree with the above NP note.   Patient stable for SNF. Continue current HF meds, she does not appear to need Lasix at this time. Think that aspirin 81 can be dropped 1 month post-PCI as she will continue on Plavix and Eliquis.   Marca Ancona 02/18/2023

## 2023-02-19 LAB — CBC WITH DIFFERENTIAL/PLATELET
Abs Immature Granulocytes: 0.02 10*3/uL (ref 0.00–0.07)
Basophils Absolute: 0.1 10*3/uL (ref 0.0–0.1)
Basophils Relative: 1 %
Eosinophils Absolute: 0.2 10*3/uL (ref 0.0–0.5)
Eosinophils Relative: 3 %
HCT: 29.2 % — ABNORMAL LOW (ref 36.0–46.0)
Hemoglobin: 9.4 g/dL — ABNORMAL LOW (ref 12.0–15.0)
Immature Granulocytes: 0 %
Lymphocytes Relative: 12 %
Lymphs Abs: 0.8 10*3/uL (ref 0.7–4.0)
MCH: 30.8 pg (ref 26.0–34.0)
MCHC: 32.2 g/dL (ref 30.0–36.0)
MCV: 95.7 fL (ref 80.0–100.0)
Monocytes Absolute: 0.5 10*3/uL (ref 0.1–1.0)
Monocytes Relative: 7 %
Neutro Abs: 5.3 10*3/uL (ref 1.7–7.7)
Neutrophils Relative %: 77 %
Platelets: 543 10*3/uL — ABNORMAL HIGH (ref 150–400)
RBC: 3.05 MIL/uL — ABNORMAL LOW (ref 3.87–5.11)
RDW: 14.4 % (ref 11.5–15.5)
WBC: 6.9 10*3/uL (ref 4.0–10.5)
nRBC: 0 % (ref 0.0–0.2)

## 2023-02-19 LAB — COMPREHENSIVE METABOLIC PANEL
ALT: 27 U/L (ref 0–44)
AST: 20 U/L (ref 15–41)
Albumin: 3.1 g/dL — ABNORMAL LOW (ref 3.5–5.0)
Alkaline Phosphatase: 58 U/L (ref 38–126)
Anion gap: 12 (ref 5–15)
BUN: 18 mg/dL (ref 8–23)
CO2: 28 mmol/L (ref 22–32)
Calcium: 9.2 mg/dL (ref 8.9–10.3)
Chloride: 94 mmol/L — ABNORMAL LOW (ref 98–111)
Creatinine, Ser: 0.93 mg/dL (ref 0.44–1.00)
GFR, Estimated: >60 mL/min (ref >60)
Glucose, Bld: 127 mg/dL — ABNORMAL HIGH (ref 70–99)
Potassium: 4.2 mmol/L (ref 3.5–5.1)
Sodium: 134 mmol/L — ABNORMAL LOW (ref 135–145)
Total Bilirubin: 0.3 mg/dL (ref 0.3–1.2)
Total Protein: 6.3 g/dL — ABNORMAL LOW (ref 6.5–8.1)

## 2023-02-19 LAB — VITAMIN D 25 HYDROXY (VIT D DEFICIENCY, FRACTURES): Vit D, 25-Hydroxy: 32.52 ng/mL (ref 30–100)

## 2023-02-19 LAB — GLUCOSE, CAPILLARY
Glucose-Capillary: 103 mg/dL — ABNORMAL HIGH (ref 70–99)
Glucose-Capillary: 87 mg/dL (ref 70–99)

## 2023-02-19 LAB — MAGNESIUM: Magnesium: 2 mg/dL (ref 1.7–2.4)

## 2023-02-19 MED ORDER — LIDOCAINE 5 % EX PTCH
1.0000 | MEDICATED_PATCH | CUTANEOUS | Status: DC
  Administered 2023-02-19 – 2023-03-05 (×13): 1 via TRANSDERMAL
  Filled 2023-02-19 (×15): qty 1

## 2023-02-19 MED ORDER — VITAMIN D (ERGOCALCIFEROL) 1.25 MG (50000 UNIT) PO CAPS
50000.0000 [IU] | ORAL_CAPSULE | ORAL | Status: DC
  Administered 2023-02-19 – 2023-02-26 (×2): 50000 [IU] via ORAL
  Filled 2023-02-19 (×2): qty 1

## 2023-02-19 MED ORDER — FUROSEMIDE 20 MG PO TABS
20.0000 mg | ORAL_TABLET | Freq: Every day | ORAL | Status: DC
  Administered 2023-02-19 – 2023-02-22 (×4): 20 mg via ORAL
  Filled 2023-02-19 (×4): qty 1

## 2023-02-19 NOTE — Progress Notes (Signed)
Inpatient Rehabilitation  Patient information reviewed and entered into eRehab system by Melissa M. Bowie, M.A., CCC/SLP, PPS Coordinator.  Information including medical coding, functional ability and quality indicators will be reviewed and updated through discharge.    

## 2023-02-19 NOTE — Progress Notes (Signed)
Inpatient Rehabilitation Care Coordinator Assessment and Plan Patient Details  Name: Madison West MRN: [id number] Date of Birth: Aug 21, 1955  Today's Date: 02/19/2023  Hospital Problems: Principal Problem:   Debility  Past Medical History:  Past Medical History:  Diagnosis Date   Anxiety    Arthritis    low back and hip pain intermittent   Breast cancer (HCC) 06/02/07   r breast -surgery ,radiaology. chemotherapy   Carpal tunnel syndrome    right hand   Colon polyps    Complication of anesthesia    Fentanyl, Versed-makes extra hyper, bradycardia x 1 in PACU, [facility] (08/15/11 cardiology felt neostigmine may have resulted in AV nodal block)    Coronary artery disease    Depression    denies   Dysplasia of vulva    Hypertension    Palpitations    PSVT, s/p adenosine 08/04/16   S/P breast lumpectomy 07/04/07   R breast   S/P radiation therapy 2009   Past Surgical History:  Past Surgical History:  Procedure Laterality Date   APPENDECTOMY     age 63   CESAREAN SECTION     x 2   COLONOSCOPY W/ POLYPECTOMY     COLONOSCOPY WITH PROPOFOL N/A 04/27/2016   Procedure: COLONOSCOPY WITH PROPOFOL;  Surgeon: Charolett Bumpers, MD;  Location: [facility];  Service: Endoscopy;  Laterality: N/A;   CORONARY STENT INTERVENTION N/A 01/29/2023   Procedure: CORONARY STENT INTERVENTION;  Surgeon: Elder Negus, MD;  Location: [facility] INVASIVE CV LAB;  Service: Cardiovascular;  Laterality: N/A;   CORONARY THROMBECTOMY N/A 01/31/2023   Procedure: Coronary Thrombectomy;  Surgeon: Elder Negus, MD;  Location: [facility];  Service: Cardiovascular;  Laterality: N/A;   CORONARY ULTRASOUND/IVUS N/A 01/31/2023   Procedure: Coronary Ultrasound/IVUS;  Surgeon: Elder Negus, MD;  Location: [facility] CV [facility];  Service: Cardiovascular;  Laterality: N/A;   CORONARY/GRAFT ACUTE MI REVASCULARIZATION N/A 01/31/2023   Procedure: Coronary/Graft Acute MI Revascularization;  Surgeon:  Elder Negus, MD;  Location: [facility];  Service: Cardiovascular;  Laterality: N/A;   DILATION AND CURETTAGE OF UTERUS     multiple   ENDARTERECTOMY Right 06/09/2019   ENDARTERECTOMY Right 06/09/2019   Procedure: ENDARTERECTOMY CAROTID RIGHT;  Surgeon: Sherren Kerns, MD;  Location: [facility] OR;  Service: Vascular;  Laterality: Right;   IABP INSERTION N/A 02/02/2023   Procedure: IABP Insertion;  Surgeon: Elder Negus, MD;  Location: [facility];  Service: Cardiovascular;  Laterality: N/A;   INTRAUTERINE DEVICE INSERTION     IUD REMOVAL     LEFT HEART CATH AND CORONARY ANGIOGRAPHY N/A 02/27/2020   Procedure: LEFT HEART CATH AND CORONARY ANGIOGRAPHY;  Surgeon: Yates Decamp, MD;  Location: [facility];  Service: Cardiovascular;  Laterality: N/A;   LEFT HEART CATH AND CORONARY ANGIOGRAPHY N/A 01/31/2023   Procedure: LEFT HEART CATH AND CORONARY ANGIOGRAPHY;  Surgeon: Elder Negus, MD;  Location: [facility];  Service: Cardiovascular;  Laterality: N/A;   MASTECTOMY PARTIAL / LUMPECTOMY W/ AXILLARY LYMPHADENECTOMY Right    lumpectomy and lymph nodes removed   PATCH ANGIOPLASTY Right 06/09/2019   Procedure: Patch Angioplasty of right carotid artery using hemashield paltinum finesse patch;  Surgeon: Sherren Kerns, MD;  Location: [facility] OR;  Service: Vascular;  Laterality: Right;   RIGHT AND LEFT HEART CATH N/A 02/01/2023   Procedure: RIGHT AND LEFT HEART CATH;  Surgeon: Elder Negus, MD;  [facility];  Service: Cardiovascular;  Laterality: N/A;   RIGHT HEART CATH N/A 02/02/2023   Procedure: RIGHT HEART CATH;  Surgeon: Dolores Patty, MD;  [facility]: [facility];  Service: Cardiovascular;  Laterality: N/A;   RIGHT/LEFT HEART CATH AND CORONARY ANGIOGRAPHY N/A 01/27/2023   Procedure: RIGHT/LEFT HEART CATH AND CORONARY ANGIOGRAPHY;  Surgeon: Elder Negus, MD;  [facility];  Service: Cardiovascular;   Laterality: N/A;   TEE WITHOUT CARDIOVERSION N/A 02/04/2023   Procedure: TRANSESOPHAGEAL ECHOCARDIOGRAM;  Surgeon: Orbie Pyo, MD;  Location: [facility] INVASIVE CV LAB;  Service: Open Heart Surgery;  Laterality: N/A;   TEMPORARY PACEMAKER N/A 02/02/2023   Procedure: TEMPORARY PACEMAKER;  Surgeon: Dolores Patty, MD;  Location: [facility];  Service: Cardiovascular;  Laterality: N/A;   TRANSCATHETER AORTIC VALVE REPLACEMENT, TRANSFEMORAL N/A 02/04/2023   Procedure: Transcatheter Aortic Valve Replacement, Transfemoral;  Surgeon: Orbie Pyo, MD;  Location: [facility];  Service: Open Heart Surgery;  Laterality: N/A;   TUBAL LIGATION     VULVA SURGERY     Multiple times for dysplasia   Social History:  reports that she quit smoking about 25 years ago. Her smoking use included cigarettes. She started smoking about 45 years ago. She has a 15 pack-year smoking history. She has never used smokeless tobacco. She reports current alcohol use. She reports that she does not use drugs.  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 Other Supports: N/A Anticipated Caregiver: MOD I Ability/Limitations of Caregiver: none Caregiver Availability: Other (Comment) (MOD I) Family Dynamics: support from daughters  Social History Preferred language: English Religion: [employer] Cultural Background: independent prior Education: HS Health Literacy - How often do you need to have someone help you when you read instructions, pamphlets, or other written material from your doctor or pharmacy?: Never Writes: Yes Employment Status: Employed Name of Employer: [employer] Return to Work Plans: TBD Marine scientist Issues: n/a Guardian/Conservator: n/a   Abuse/Neglect Abuse/Neglect Assessment Can Be Completed: Yes Physical Abuse: Denies Verbal Abuse: Denies Sexual Abuse:  Denies Exploitation of patient/patient's resources: Denies Self-Neglect: Denies  Patient response to: Social Isolation - How often do you feel lonely or isolated from those around you?: Never  Emotional Status Pt's affect, behavior and adjustment status: High anxiety Recent Psychosocial Issues: coping, hx of depression and anxiety Psychiatric History: hx of depression and anxiety Substance Abuse History: n/a  Patient / Family Perceptions, Expectations & Goals Pt/Family understanding of illness & functional limitations: yes Premorbid pt/family roles/activities: Independent Anticipated changes in roles/activities/participation: Patient plans to remain MOD I Pt/family expectations/goals: MOD I  Building surveyor: None Premorbid Home Care/DME Agencies: None Transportation available at discharge: daughter/friends Is the patient able to respond to transportation needs?: Yes In the past 12 months, has lack of transportation kept you from medical appointments or from getting medications?: No In the past 12 months, has lack of transportation kept you from meetings, work, or from getting things needed for daily living?: No Resource referrals recommended: Neuropsychology  Discharge Planning Living Arrangements: Children Support Systems: Children Type of Residence: Private residence Insurance Resources: [employer] Financial Resources: Employment Financial Screen Referred: No Living Expenses: Own Money Management: Patient Does the patient have any problems obtaining your medications?: No Home Management: Independent Patient/Family Preliminary Plans: Plans to manage or have some assistance Care Coordinator Barriers to Discharge: Decreased caregiver support, Lack of/limited family support DC  Planning Additional Notes/Comments: WB, high anxiety Expected length of stay: 14-16 Days  Clinical Impression Sw made attempt to see patient, patient with therapy team. SW will FU  with patient on 9/2.   Andria Rhein 02/19/2023, 1:30 PM

## 2023-02-19 NOTE — Progress Notes (Signed)
Inpatient Rehabilitation Center Individual Statement of Services  Patient Name:  Madison West  Date:  02/19/2023  Welcome to the Inpatient Rehabilitation Center.  Our goal is to provide you with an individualized program based on your diagnosis and situation, designed to meet your specific needs.  With this comprehensive rehabilitation program, you will be expected to participate in at least 3 hours of rehabilitation therapies Monday-Friday, with modified therapy programming on the weekends.  Your rehabilitation program will include the following services:  Physical Therapy (PT), Occupational Therapy (OT), Speech Therapy (ST), 24 hour per day rehabilitation nursing, Therapeutic Recreaction (TR), Neuropsychology, Care Coordinator, Rehabilitation Medicine, Nutrition Services, Pharmacy Services, and Other  Weekly team conferences will be held on Wednesdays to discuss your progress.  Your Inpatient Rehabilitation Care Coordinator will talk with you frequently to get your input and to update you on team discussions.  Team conferences with you and your family in attendance may also be held.  Expected length of stay:  14-16 Days  Overall anticipated outcome:  MOD I  Depending on your progress and recovery, your program may change. Your Inpatient Rehabilitation Care Coordinator will coordinate services and will keep you informed of any changes. Your Inpatient Rehabilitation Care Coordinator's name and contact numbers are listed  below.  The following services may also be recommended but are not provided by the Inpatient Rehabilitation Center:   Home Health Rehabiltiation Services Outpatient Rehabilitation Services    Arrangements will be made to provide these services after discharge if needed.  Arrangements include referral to agencies that provide these services.  Your insurance has been verified to be:   Medicare A & B Your primary doctor is:  [name], MD  Pertinent information will  be shared with your doctor and your insurance company.  Inpatient Rehabilitation Care Coordinator:  Lavera Guise, [place] [phone] or [phone]  Information discussed with and copy given to patient by: Andria Rhein, 02/19/2023, 1:01 PM

## 2023-02-19 NOTE — Plan of Care (Signed)
  Problem: Consults Goal: RH GENERAL PATIENT EDUCATION Description: See Patient Education module for education specifics. Outcome: Progressing   Problem: RH BOWEL ELIMINATION Goal: RH STG MANAGE BOWEL WITH ASSISTANCE Description: STG Manage Bowel with mod I  Assistance. Outcome: Progressing Goal: RH STG MANAGE BOWEL W/MEDICATION W/ASSISTANCE Description: STG Manage Bowel with Medication with mod I Assistance. Outcome: Progressing   Problem: RH BLADDER ELIMINATION Goal: RH STG MANAGE BLADDER WITH ASSISTANCE Description: STG Manage Bladder With Mod I Assistance Outcome: Progressing Goal: RH STG MANAGE BLADDER WITH MEDICATION WITH ASSISTANCE Description: STG Manage Bladder With Medication With mod I Assistance. Outcome: Progressing   Problem: RH SAFETY Goal: RH STG ADHERE TO SAFETY PRECAUTIONS W/ASSISTANCE/DEVICE Description: STG Adhere to Safety Precautions With cues Assistance/Device. Outcome: Progressing   Problem: RH KNOWLEDGE DEFICIT GENERAL Goal: RH STG INCREASE KNOWLEDGE OF SELF CARE AFTER HOSPITALIZATION Description: Manage self care with cues using educational resources independently Outcome: Progressing   Problem: RH KNOWLEDGE DEFICIT Goal: RH STG INCREASE KNOWLEDGE OF HYPERTENSION Description: Patient and spouse will be able to manage HTN with medications and dietary modifications using educational resources independently Outcome: Progressing Goal: RH STG INCREASE KNOWLEGDE OF HYPERLIPIDEMIA Description: Patient and spouse will be able to manage HLD with medications and dietary modifications using educational resources independently Outcome: Progressing   Problem: Cardiac: Goal: Ability to achieve and maintain adequate cardiopulmonary perfusion will improve Outcome: Progressing Goal: Vascular access site(s) Level 0-1 will be maintained Outcome: Progressing   Problem: Fluid Volume: Goal: Ability to achieve a balanced intake and output will improve Outcome:  Progressing   Problem: Physical Regulation: Goal: Complications related to the disease process, condition or treatment will be avoided or minimized Outcome: Progressing   Problem: Respiratory: Goal: Will regain and/or maintain adequate ventilation Outcome: Progressing   Problem: Education: Goal: Understanding of CV disease, CV risk reduction, and recovery process will improve Outcome: Progressing Goal: Individualized Educational Video(s) Outcome: Progressing   Problem: Activity: Goal: Ability to return to baseline activity level will improve Outcome: Progressing   Problem: Cardiovascular: Goal: Ability to achieve and maintain adequate cardiovascular perfusion will improve Outcome: Progressing Goal: Vascular access site(s) Level 0-1 will be maintained Outcome: Progressing   Problem: Health Behavior/Discharge Planning: Goal: Ability to safely manage health-related needs after discharge will improve Outcome: Progressing

## 2023-02-19 NOTE — Progress Notes (Signed)
Patient ID: Madison West, female   DOB: June 20, 1956, 67 y.o.   MRN: [id number] Met with the patient to review current situation, rehab process, team conference, and plan of care. Discussed secondary risks including HF, CAD, HTN and HLD with medications.  Patient is on a 1200 cc FR.  Reviewed DAPT x month then ASA to be discontinued per HF team. Continue supplemental oxygen @ 2L/min [place]. 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   Left internal jugular DVT resolved per report; patient notes arm is still sore. Continue to follow along to address educational needs to facilitate preparation for discharge. Pamelia Hoit

## 2023-02-19 NOTE — Evaluation (Signed)
Physical Therapy Assessment and Plan  Patient Details  Name: Madison West MRN: [id number] Date of Birth: [birth year]  PT Diagnosis: Abnormal posture, Abnormality of gait, Difficulty walking, Muscle weakness, and Pain in L ribs Rehab Potential: Good ELOS: 7-10 days   Today's Date: 02/19/2023 PT Individual Time: 4098-1191 PT Individual Time Calculation (min): 53 min  Today's Date: 02/19/2023 PT Missed Time: 7 Minutes Missed Time Reason: Patient fatigue   Hospital Problem: Principal Problem:   Debility   Past Medical History:  Past Medical History:  Diagnosis Date   Anxiety    Arthritis    low back and hip pain intermittent   Breast cancer (HCC) 06/02/07   r breast -surgery ,radiaology. chemotherapy   Carpal tunnel syndrome    right hand   Colon polyps    Complication of anesthesia    Fentanyl, Versed-makes extra hyper, bradycardia x 1 in PACU, [facility] (08/15/11 cardiology felt neostigmine may have resulted in AV nodal block)    Coronary artery disease    Depression    denies   Dysplasia of vulva    Hypertension    Palpitations    PSVT, s/p adenosine 08/04/16   S/P breast lumpectomy 07/04/07   R breast   S/P radiation therapy 2009   Past Surgical History:  Past Surgical History:  Procedure Laterality Date   APPENDECTOMY     age 51   CESAREAN SECTION     x 2   COLONOSCOPY W/ POLYPECTOMY     COLONOSCOPY WITH PROPOFOL N/A 04/27/2016   Procedure: COLONOSCOPY WITH PROPOFOL;  Surgeon: Charolett Bumpers, MD;  Location: [facility];  Service: Endoscopy;  Laterality: N/A;   CORONARY STENT INTERVENTION N/A 01/29/2023   Procedure: CORONARY STENT INTERVENTION;  Surgeon: Elder Negus, MD;  Location: [facility];  Service: Cardiovascular;  Laterality: N/A;   CORONARY THROMBECTOMY N/A 01/31/2023   Procedure: Coronary Thrombectomy;  Surgeon: Elder Negus, MD;  Location: [facility];  Service: Cardiovascular;  Laterality: N/A;   CORONARY ULTRASOUND/IVUS  N/A 01/31/2023   Procedure: Coronary Ultrasound/IVUS;  Surgeon: Elder Negus, MD;  Location: [facility];  Service: Cardiovascular;  Laterality: N/A;   CORONARY/GRAFT ACUTE MI REVASCULARIZATION N/A 01/31/2023   Procedure: Coronary/Graft Acute MI Revascularization;  Surgeon: Elder Negus, MD;  [facility]: [facility];  Service: Cardiovascular;  Laterality: N/A;   DILATION AND CURETTAGE OF UTERUS     multiple   ENDARTERECTOMY Right 06/09/2019   ENDARTERECTOMY Right 06/09/2019   Procedure: ENDARTERECTOMY CAROTID RIGHT;  Surgeon: Sherren Kerns, MD;  Location: [facility] OR;  Service: Vascular;  Laterality: Right;   IABP INSERTION N/A 02/02/2023   Procedure: IABP Insertion;  Surgeon: Elder Negus, MD;  [facility];  Service: Cardiovascular;  Laterality: N/A;   INTRAUTERINE DEVICE INSERTION     IUD REMOVAL     LEFT HEART CATH AND CORONARY ANGIOGRAPHY N/A 02/27/2020   Procedure: LEFT HEART CATH AND CORONARY ANGIOGRAPHY;  Surgeon: Yates Decamp, MD;  [facility];  Service: Cardiovascular;  Laterality: N/A;   LEFT HEART CATH AND CORONARY ANGIOGRAPHY N/A 01/31/2023   Procedure: LEFT HEART CATH AND CORONARY ANGIOGRAPHY;  Surgeon: Elder Negus, MD;  [facility];  Service: Cardiovascular;  Laterality: N/A;   MASTECTOMY PARTIAL / LUMPECTOMY W/ AXILLARY LYMPHADENECTOMY Right    lumpectomy and lymph nodes removed   PATCH ANGIOPLASTY Right 06/09/2019   Procedure: Patch Angioplasty of right carotid artery using  hemashield paltinum finesse patch;  Surgeon: Sherren Kerns, MD;  Location: [facility] OR;  Service: Vascular;  Laterality: Right;   RIGHT AND LEFT HEART CATH N/A 02/01/2023   Procedure: RIGHT AND LEFT HEART CATH;  Surgeon: Elder Negus, MD;  [facility];  Service: Cardiovascular;  Laterality: N/A;   RIGHT HEART CATH N/A 02/02/2023   Procedure: RIGHT HEART CATH;  Surgeon: Dolores Patty, MD;   [facility];  Service: Cardiovascular;  Laterality: N/A;   RIGHT/LEFT HEART CATH AND CORONARY ANGIOGRAPHY N/A 01/27/2023   Procedure: RIGHT/LEFT HEART CATH AND CORONARY ANGIOGRAPHY;  Surgeon: Elder Negus, MD;  [facility];  Service: Cardiovascular;  Laterality: N/A;   TEE WITHOUT CARDIOVERSION N/A 02/04/2023   Procedure: TRANSESOPHAGEAL ECHOCARDIOGRAM;  Surgeon: Orbie Pyo, MD;  Location: [facility] INVASIVE CV LAB;  Service: Open Heart Surgery;  Laterality: N/A;   TEMPORARY PACEMAKER N/A 02/02/2023   Procedure: TEMPORARY PACEMAKER;  Surgeon: Dolores Patty, MD;  [facility];  Service: Cardiovascular;  Laterality: N/A;   TRANSCATHETER AORTIC VALVE REPLACEMENT, TRANSFEMORAL N/A 02/04/2023   Procedure: Transcatheter Aortic Valve Replacement, Transfemoral;  Surgeon: Orbie Pyo, MD;  [facility]: [facility];  Service: Open Heart Surgery;  Laterality: N/A;   TUBAL LIGATION     VULVA SURGERY     Multiple times for dysplasia    Assessment & Plan Clinical Impression: Patient is a 67 y.o. year old female who follows with Dr. Tacey Ruiz Cardiology with a history of AS, CAD s/p PCI in 2021 who presented to the [facility] ED on 01/23/2023 complaining ongoing shortness of breath. Her BNP was elevated along with mild elevation in her troponins. No hypoxemia but some increased work of breathing with exertion. Transferred to [facility] telemetry unit and admitted to hospitalist service. CT angiogram of chest showed no evidence of pulmonary embolism but mild bronchial wall thickening. EKG showed PVC, LAE, LAFB minimal ST elevation. Continued supportive care with IV Lasix, intake and output charting daily weights. Echo updated>>now with depressed LV function and mean gradient of aortic valve of and evidence for low flow low gradient AS with calcified valve visually. Cardiology consulted on 8/07 and recommended heart cath. Given the  cardiomyopathy and HFrEF, discussed the importance of guideline directed medical therapy. She appeared to be apprehensive but willing to uptitrate medical therapy given the clinical findings. Discontinued amlodipine 2.5 mg p.o. daily. Continued losartan. Started spironolactone 25 mg p.o. daily. Cardiothoracic surgery consulted on 8/08: Per Dr. Leafy Ro, NYHA class 2-3 symptoms of ischemic cardiomyopathy complicated with low flow low gradient AS. Depressed LV function with EF 30-35% and with comorbidities that would make SAVR/CABG higher risk. He felt that TAVR work up to determine if TAVR anatomically feasable would be best management for her AS and to PCI LAD lesion. Patient underwent CT angiogram of the chest, CT coronaries and CT angio of the abdomen and pelvis prior to deciding on further course of treatment. HFrEF likely a combination of valvular and ischemic cardiomyopathy. She experienced episodes of sinus tachycardia at rest and exertion, and brief episodes of NSVT. On 8/11, she developed ventricular tachycardia with no pulse. CPR intiated; ACLS protocol with epi and defibrillated times two with ROSC. V-tach once again, shock delivered and given magnesium and calcium. Patient started on amio drip. Patient transferred to [facility] ICU and cardiology consulted. EKG showing ST elevation in anterior leads. Code STEMI. PCCM consulted. Acute stent thrombosis. [place] balloon up  to 20 atm with restoration of TIMI-3 flow. Dr. Rosemary Holms did not think additional stent was necessary in absence of obvious stent edge dissection, and increased risk of stent thrombosis with overlapping stents. ST elevation resolved on EKG during the procedure. EKG today showed limited septal infarct. On the evening of 8/12, she developed Ventricular bigeminy, trigeminy through the evening with episodes of NSVT for which amiodarone was started. At 10:52, she had VT arrest X2, with one she lost pulse, was successfully defibrillated. Had brief pause  after defibrillation but returned to sinus rhythm with ROSC. She has no new chest pain at this time. 12 lead EKG does not show new ST elevation, show incomplete LBBB and age indeterminate septal infarct. However, given her stent thrombosis yesterday, low EF, severe AS, Dr. Rosemary Holms recommended re-look angiography, along with RHC to assess invasive hemodynamics. Remained intubated for airway protection. Discussed with Dr. Gala Romney and IABP placed. Dr. Lynnette Caffey consulted for placement on VA ECMO support and potential TAVR. She underwent procedure on 8/15. Extubated on 8/18. PICC placed 8/19. Completed antibiotics for pneumonia on 8/20. 8/21 urology consulted secondary to difficult Foley catheter placement. History noted of multiple partial vulvectomy in the past. Acute DVT in left internal jugular along with acute superficial vein thrombosis involving the left cephalic vein, proximal upper arm to mid forearm and started on apixaban. Heart failure team adjusting medications, diuresing. Off all vasoactive medications and O2 requirements down to 4 L/min and transferred to PCU on 8/25. Noted hypotensive overnight 8/27 and required albumin. Heart failure team felt likely over diuresis. Holding Lasix and checked orthostatics. CBC and lactic acid obtained. LA + 0.6. Entresto reduced to 49-51. Continued spironolactone 25 mg daily. Foley removed 8/28. LUE venous duplex repeated on 8/28: Findings consistent with age indeterminate superficial vein thrombosis involving the left cephalic vein-essentially unchanged as compared to previous examination. Previously visualized internal jugular vein DVT appears resolved on this examination. Plan to stay off of Lasix and begin Fraxiga 10 mg daily. On apixaban for left internal jugular DVT. HF team plans to continue triple therapy for now given history of stent thrombosis. After 1 month post-PCI, plan to drop ASA. No beta blocker now for low output. Tolerating diet. Patient was able to  perform transfers/ambulation around room with increased time, small steps, with CGA and rest breaks. She was able to complete LB dressing with CGA, increased time, pain in chest when reaching/bending down. The patient requires inpatient medicine and rehabilitation evaluations and services for ongoing dysfunction secondary to debility from prolonged hospitalization due to CAD/CHF s/p TAVR.   Patient currently requires min with mobility secondary to muscle weakness, decreased cardiorespiratoy endurance and decreased oxygen support, and decreased standing balance, decreased postural control, and decreased balance strategies.  Prior to hospitalization, patient was independent  with mobility and lived with Daughter (2 daughter live with pt) in a House home.  Home access is 2Stairs to enter.  Patient will benefit from skilled PT intervention to maximize safe functional mobility, minimize fall risk, and decrease caregiver burden for planned discharge home with intermittent assist.  Anticipate patient will benefit from follow up OP at discharge.  PT - End of Session Activity Tolerance: Tolerates 30+ min activity with multiple rests Endurance Deficit: Yes Endurance Deficit Description: significant deconditioning requiring multiple extended rest breaks PT Assessment Rehab Potential (ACUTE/IP ONLY): Good PT Barriers to Discharge: Decreased caregiver support;Home environment access/layout;Other (comments) PT Barriers to Discharge Comments: pain, daughters will only be able to provide intermittent assist PT Patient demonstrates  impairments in the following area(s): Balance;Edema;Endurance;Pain;Skin Integrity PT Transfers Functional Problem(s): Bed Mobility;Bed to Chair;Car;Furniture PT Locomotion Functional Problem(s): Ambulation;Wheelchair Mobility;Stairs PT Plan PT Intensity: Minimum of 1-2 x/day ,45 to 90 minutes PT Frequency: 5 out of 7 days PT Duration Estimated Length of Stay: 7-10 days PT  Treatment/Interventions: Ambulation/gait training;Discharge planning;Therapeutic Activities;Balance/vestibular training;Disease management/prevention;Neuromuscular re-education;Skin care/wound management;Therapeutic Exercise;Wheelchair propulsion/positioning;DME/adaptive equipment instruction;Cognitive remediation/compensation;Pain management;Splinting/orthotics;UE/LE Strength taining/ROM;Community reintegration;Patient/family education;Stair training;UE/LE Coordination activities PT Transfers Anticipated Outcome(s): Mod I with LRAD PT Locomotion Anticipated Outcome(s): Mod I with LRAD PT Recommendation Follow Up Recommendations: Outpatient PT Patient destination: Home Equipment Recommended: To be determined Equipment Details: has none   PT Evaluation Precautions/Restrictions Precautions Precautions: Fall Precaution Comments: monitor O2 sat Restrictions Weight Bearing Restrictions: No RLE Weight Bearing: Weight bearing as tolerated Pain Interference Pain Interference Pain Effect on Sleep: 3. Frequently Pain Interference with Therapy Activities: 1. Rarely or not at all Pain Interference with Day-to-Day Activities: 1. Rarely or not at all Home Living/Prior Functioning Home Living Living Arrangements: Children Available Help at Discharge: Family;Available PRN/intermittently (both work full time) Type of Home: House Home Access: Stairs to enter [employer] of Steps: 2 Entrance Stairs-Rails: Can reach both;Right;Left Home Layout: One level Bathroom Shower/Tub: Tub/shower unit;Walk-in shower (tub/shower has curtain, walk in shower has door but needs some work done to it) Firefighter: Pharmacist, community: Yes  Lives With: Daughter (2 daughter live with pt) Prior Function Level of Independence: Independent with basic ADLs;Independent with gait;Independent with homemaking with ambulation;Independent with transfers  Able to Take Stairs?: Yes Driving:  Yes Vocation: Part time employment Vocation Requirements: bartender Vision/Perception  Vision - History Ability to See in Adequate Light: 0 Adequate Perception Perception: Within Functional Limits Praxis Praxis: WFL  Cognition Overall Cognitive Status: Within Functional Limits for tasks assessed Arousal/Alertness: Awake/alert Orientation Level: Oriented X4 Year: 2024 Day of Week: Correct Attention: Focused;Sustained Focused Attention: Appears intact Sustained Attention: Impaired Sustained Attention Impairment: Verbal complex Memory: Appears intact Memory Impairment: Decreased recall of new information;Decreased short term memory Decreased Short Term Memory: Verbal basic;Verbal complex Awareness: Appears intact Problem Solving: Appears intact Problem Solving Impairment: Verbal complex;Functional complex Executive Function: Organizing;Self Correcting Organizing: Impaired Organizing Impairment: Verbal complex Self Correcting: Impaired Self Correcting Impairment: Verbal complex Behaviors:  (appeared anxious) Safety/Judgment: Appears intact Sensation Sensation Light Touch: Appears Intact Hot/Cold: Not tested Proprioception: Appears Intact Stereognosis: Not tested Coordination Gross Motor Movements are Fluid and Coordinated: Yes Fine Motor Movements are Fluid and Coordinated: Yes Coordination and Movement Description: global weakness/deconditioning Finger Nose Finger Test: [facility] bilaterally Heel Shin Test: [facility] bilaterally Motor  Motor Motor: Within Functional Limits  Trunk/Postural Assessment  Cervical Assessment Cervical Assessment: Exceptions to [facility] (forward head) Thoracic Assessment Thoracic Assessment: Exceptions to [facility] (thoracic rounding) Lumbar Assessment Lumbar Assessment: Within Functional Limits Postural Control Postural Control: Within Functional Limits  Balance Balance Balance Assessed: Yes Static Sitting Balance Static Sitting - Balance Support: Feet  supported;No upper extremity supported Static Sitting - Level of Assistance: 6: Modified independent (Device/Increase time) Dynamic Sitting Balance Dynamic Sitting - Balance Support: Feet supported;No upper extremity supported Dynamic Sitting - Level of Assistance: 5: Stand by assistance (supervision) Static Standing Balance Static Standing - Balance Support: No upper extremity supported;During functional activity Static Standing - Level of Assistance: 5: Stand by assistance (CGA) Dynamic Standing Balance Dynamic Standing - Balance Support: No upper extremity supported;During functional activity Dynamic Standing - Level of Assistance: 4: Min assist Dynamic Standing - Comments: transfers and gait without AD Extremity Assessment  RLE Assessment RLE  Assessment: Exceptions to [facility] General Strength Comments: tested sitting in recliner RLE Strength Right Hip Flexion: 4-/5 Right Hip ABduction: 4-/5 Right Hip ADduction: 4-/5 Right Knee Flexion: 4-/5 Right Knee Extension: 4-/5 Right Ankle Dorsiflexion: 4/5 Right Ankle Plantar Flexion: 4/5 LLE Assessment LLE Assessment: Exceptions to [facility] General Strength Comments: tested sitting in recliner LLE Strength Left Hip Flexion: 4-/5 Left Hip ABduction: 4-/5 Left Hip ADduction: 4-/5 Left Knee Flexion: 4-/5 Left Knee Extension: 4-/5 Left Ankle Dorsiflexion: 4/5 Left Ankle Plantar Flexion: 4/5  Care Tool Care Tool Bed Mobility Roll left and right activity        Sit to lying activity        Lying to sitting on side of bed activity         Care Tool Transfers Sit to stand transfer   Sit to stand assist level: Contact Guard/Touching assist    Chair/bed transfer   Chair/bed transfer assist level: Contact Guard/Touching assist     Scientist, research (physical sciences) transfer activity did not occur: Safety/medical concerns (fatigue)        Care Tool Locomotion Ambulation   Assist level: Minimal Assistance - Patient >  75% Assistive device: No Device Max distance: 59ft  Walk 10 feet activity   Assist level: Minimal Assistance - Patient > 75% Assistive device: No Device   Walk 50 feet with 2 turns activity Walk 50 feet with 2 turns activity did not occur: Safety/medical concerns (fatigue, global weakness/deconditioning)      Walk 150 feet activity Walk 150 feet activity did not occur: Safety/medical concerns (fatigue, global weakness/deconditioning)      Walk 10 feet on uneven surfaces activity Walk 10 feet on uneven surfaces activity did not occur: Safety/medical concerns (fatigue, global weakness/deconditioning)      Stairs Stair activity did not occur: Safety/medical concerns (fatigue, global weakness/deconditioning)        Walk up/down 1 step activity Walk up/down 1 step or curb (drop down) activity did not occur: Safety/medical concerns (fatigue, global weakness/deconditioning)      Walk up/down 4 steps activity Walk up/down 4 steps activity did not occur: Safety/medical concerns (fatigue, global weakness/deconditioning)      Walk up/down 12 steps activity Walk up/down 12 steps activity did not occur: Safety/medical concerns (fatigue, global weakness/deconditioning)      Pick up small objects from floor Pick up small object from the floor (from standing position) activity did not occur:  (decreased standing balance/coordination)      Wheelchair Is the patient using a wheelchair?: Yes Type of Wheelchair: Manual Wheelchair activity did not occur: Safety/medical concerns (fatigue)      Wheel 50 feet with 2 turns activity Wheelchair 50 feet with 2 turns activity did not occur: Safety/medical concerns (fatigue)    Wheel 150 feet activity Wheelchair 150 feet activity did not occur: Safety/medical concerns (fatigue)      Refer to Care Plan for Long Term Goals  SHORT TERM GOAL WEEK 1 PT Short Term Goal 1 (Week 1): STG=LTG due to LOS  Recommendations for other services: None   Skilled  Therapeutic Intervention Evaluation completed (see details above and below) with education on PT POC and goals and individual treatment initiated with focus on functional mobility/transfers, generalized strengthening and endurance, dynamic standing balance/coordination, and ambulation. Received pt sitting in recliner, pt educated on PT evaluation, CIR policies, and therapy schedule and agreeable. Pt reported pain 4/10 in L ribcage but declined any pain medication. Pt on  2L O2 via [place] with SPO2 90% at rest. Pt hyperverbal but easily redirected throughout session. Pt's daughter arrived and requested to observe session while in room.   Pt transferred sit<>stand without AD and CGA and ambulated 27ft without AD and min A in room - limited by fatigue and SPO2 dropped to 87% but increased to 97% with rest and pursed lip breathing. Stood again with RW and CGA and ambulated additional 68ft with RW and CGA/close supervision with assist to manage O2 tank - SPO2 dropped to 90% and increased to 93% with seated rest. Stood 3rd time with RW and CGA and ambulated additional 63ft with RW and CGA/close supervision - SPO2 dropped to 89% but increased to 95% with seated rest break and pt reporting 8/10 fatigue afterwards and requested to "be done". Concluded session with pt sitting in recliner, needs within reach, and seatbelt alarm on. Daughter present at bedside. 7 minutes missed of skilled physical therapy due to fatigue.   Mobility Bed Mobility Bed Mobility: Left Sidelying to Sit;Sitting - Scoot to Edge of Bed Left Sidelying to Sit: Supervision/Verbal cueing Sitting - Scoot to [place] of Bed: Supervision/Verbal cueing Transfers Transfers: Sit to Stand;Stand to Sit;Stand Pivot Transfers Sit to Stand: Contact Guard/Touching assist Stand to Sit: Contact Guard/Touching assist Stand Pivot Transfers: Minimal Assistance - Patient > 75% Stand Pivot Transfer Details: Verbal cues for precautions/safety;Verbal cues for  technique Transfer (Assistive device): None Locomotion  Gait Ambulation: Yes Gait Assistance: Minimal Assistance - Patient > 75% Gait Distance (Feet): 15 Feet Assistive device: None Gait Gait: Yes Gait Pattern: Impaired Gait Pattern: Poor foot clearance - right;Narrow base of support;Poor foot clearance - left;Decreased stride length;Decreased step length - right;Decreased step length - left;Decreased stance time - right Gait velocity: decreased Stairs / Additional Locomotion Stairs: No Wheelchair Mobility Wheelchair Mobility: No   Discharge Criteria: Patient will be discharged from PT if patient refuses treatment 3 consecutive times without medical reason, if treatment goals not met, if there is a change in medical status, if patient makes no progress towards goals or if patient is discharged from hospital.  The above assessment, treatment plan, treatment alternatives and goals were discussed and mutually agreed upon: by patient and by family  Ramond Craver Zaunegger PT, DPT 02/19/2023, 2:23 PM

## 2023-02-19 NOTE — Progress Notes (Signed)
PROGRESS NOTE   Subjective/Complaints: C/o shortness of breath, has been present since June, discussed that CXR shows pleural effusion, lasix 20mg  daily started, oxygen increased to 2L with good benefit  ROS: +shortness of breath   Objective:   DG CHEST PORT 1 VIEW  Result Date: 02/18/2023 CLINICAL DATA:  Dyspnea. EXAM: PORTABLE CHEST 1 VIEW COMPARISON:  02/13/2023 FINDINGS: Left arm PICC line tip terminates in the distal SVC. Aortic atherosclerotic calcifications. Status post TAVR. Small left pleural effusion. Bilateral peripheral predominant opacities within the right upper lobe and lung bases are unchanged from previous exam. IMPRESSION: 1. Persistent bilateral peripheral predominant pulmonary opacities with small left pleural effusion. Electronically Signed   By: Signa Kell M.D.   On: 02/18/2023 13:45   VAS [place] UPPER EXTREMITY VENOUS DUPLEX  Result Date: 02/17/2023 UPPER VENOUS STUDY  Patient Name:  Madison West  Date of Exam:   02/17/2023 Medical Rec #: [id number]            Accession #:    [id number] Date of Birth: Aug 14, 1955            Patient Gender: F Patient Age:   67 years Exam Location:  [facility] Procedure:      VAS [place] UPPER EXTREMITY VENOUS DUPLEX Referring Phys: Lillia Abed [facility] --------------------------------------------------------------------------------  Indications: Left forearm pain, indwelling PICC, recent examination positive for acute IJV DVT and cephalic SVT Limitations: Bandages. PICC line. Comparison Study: 02-10-2023 Left upper extremity venous study positive for                   acute DVT involving the IJV and SVT involving the cephalic                   vein. Performing Technologist: Jean Rosenthal RDMS, RVT  Examination Guidelines: A complete evaluation includes B-mode imaging, spectral Doppler, color Doppler, and power Doppler as needed of all accessible portions of each vessel. Bilateral testing  is considered an integral part of a complete examination. Limited examinations for reoccurring indications may be performed as noted.  Right Findings: +----------+------------+---------+-----------+----------+-------+ RIGHT     CompressiblePhasicitySpontaneousPropertiesSummary +----------+------------+---------+-----------+----------+-------+ Subclavian               Yes       Yes                      +----------+------------+---------+-----------+----------+-------+  Left Findings: +----------+------------+---------+-----------+-----------------+--------------+ LEFT      CompressiblePhasicitySpontaneous   Properties       Summary     +----------+------------+---------+-----------+-----------------+--------------+ IJV           Full       Yes       Yes                                    +----------+------------+---------+-----------+-----------------+--------------+ Subclavian               Yes       Yes                                    +----------+------------+---------+-----------+-----------------+--------------+  Axillary      Full       Yes       Yes                                    +----------+------------+---------+-----------+-----------------+--------------+ Brachial      Full                        High bifurcation,                                                              Limited                    +----------+------------+---------+-----------+-----------------+--------------+ Radial        Full                                                        +----------+------------+---------+-----------+-----------------+--------------+ Ulnar         Full                                                        +----------+------------+---------+-----------+-----------------+--------------+ Cephalic    Partial      No        No                           Age                                                                   Indeterminate  +----------+------------+---------+-----------+-----------------+--------------+ Basilic       Full                             Limited                    +----------+------------+---------+-----------+-----------------+--------------+  Summary:  Right: No evidence of thrombosis in the subclavian.  Left: No evidence of deep vein thrombosis in the upper extremity. Findings consistent with age indeterminate superficial vein thrombosis involving the left cephalic vein- essentially unchanged as compared to previous examination. Previously visualized internal jugular vein DVT appears resolved on today's examination.  *See table(s) above for measurements and observations.  Diagnosing physician: Sherald Hess MD Electronically signed by Sherald Hess MD on 02/17/2023 at 1:14:30 PM.    Final    Recent Labs    02/17/23 0500  WBC 7.0  HGB 8.6*  HCT 26.4*  PLT 495*   Recent Labs    02/17/23 0500 02/18/23 0547  NA 133* 134*  K 4.1 4.5  CL  95* 95*  CO2 29 30  GLUCOSE 115* 112*  BUN 18 17  CREATININE 0.78 0.88  CALCIUM 9.0 9.3    Intake/Output Summary (Last 24 hours) at 02/19/2023 0943 Last data filed at 02/19/2023 0426 Gross per 24 hour  Intake --  Output 1200 ml  Net -1200 ml        Physical Exam: Vital Signs Blood pressure (!) 141/71, pulse 76, temperature 97.6 F (36.4 C), resp. rate 18, height 5\' 8"  (1.727 m), weight 100.6 kg, SpO2 92%. Gen: no distress, normal appearing HEENT: oral mucosa pink and moist, NCAT Cardio: Reg rate Resp: No respiratory distress. No accessory muscle usage.  Right lower > left lower lobe mild rhonci and on 2 L [place] Cardio: Well perfused appearance. No peripheral edema. Abdomen: Mildly distended. Nontender.   Psych: Appropriate mood and affect. Neuro: AAOx4. No apparent cognitive deficits  Skin: Groin catheterization sites well-healed, without apparent hematoma.  Clean, dry, intact.   Neurologic Exam:   DTRs: Reflexes were 2+  in bilateral achilles, patella, biceps, BR and triceps. Babinsky: flexor responses b/l.   Hoffmans: negative b/l Sensory exam: revealed normal sensation in all dermatomal regions in bilateral upper extremities and bilateral lower extremities Motor exam: strength 5-/5 throughout bilateral upper extremities and bilateral lower extremities Coordination: Fine motor coordination was normal.  Mild bilateral upper extremity intention tremor   MSK: Tender to palpation over sternum and left chest, no bruising or apparent lesions  Assessment/Plan: 1. Functional deficits which require 3+ hours per day of interdisciplinary therapy in a comprehensive inpatient rehab setting. Physiatrist is providing close team supervision and 24 hour management of active medical problems listed below. Physiatrist and rehab team continue to assess barriers to discharge/monitor patient progress toward functional and medical goals  Care Tool:  Bathing    Body parts bathed by patient: Right arm, Left arm, Chest, Abdomen, Front perineal area, Buttocks, Right upper leg, Left upper leg, Face   Body parts bathed by helper: Right lower leg, Left lower leg     Bathing assist Assist Level: Minimal Assistance - Patient > 75%     Upper Body Dressing/Undressing Upper body dressing   What is the patient wearing?: Pull over shirt, Bra    Upper body assist Assist Level: Minimal Assistance - Patient > 75%    Lower Body Dressing/Undressing Lower body dressing      What is the patient wearing?: Underwear/pull up, Pants     Lower body assist Assist for lower body dressing: Minimal Assistance - Patient > 75%     Toileting Toileting    Toileting assist Assist for toileting: Minimal Assistance - Patient > 75%     Transfers Chair/bed transfer  Transfers assist           Locomotion Ambulation   Ambulation assist              Walk 10 feet activity   Assist           Walk 50 feet  activity   Assist           Walk 150 feet activity   Assist           Walk 10 feet on uneven surface  activity   Assist           Wheelchair     Assist               Wheelchair 50 feet with 2 turns activity    Assist  Wheelchair 150 feet activity     Assist          Blood pressure (!) 141/71, pulse 76, temperature 97.6 F (36.4 C), resp. rate 18, height 5\' 8"  (1.727 m), weight 100.6 kg, SpO2 92%.  Medical Problem List and Plan: 1. 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  Antithrombotics: -DVT/anticoagulation:  Pharmaceutical: Eliquis 5 mg twice daily             -antiplatelet therapy: Aspirin and Plavix    3. Pain Management: Tylenol, oxycodone 5 mg as needed             -continue Robaxin 500 mg BID             -continue gabapentin 300 mg TID             -continue Lidoderm patches   4. Mood/Behavior/Sleep: LCSW to evaluate and provide emotional support. History of anxiety/depression             -continue Buspar 10 mg BID and  -continue Xanax 3 mg BID and once daily  prn             -refusing Prozac 20 mg q HS>>will discontinue             -continue melatonin 3 mg q HS             -antipsychotic agents: n/a   5. Neuropsych/cognition: This patient is capable of making decisions on her own behalf.   6. Skin/Wound Care: Routine skin care checks   7. Fluids/Electrolytes/Nutrition: Routine Is and Os and follow-up chemistries             -hyponatremia>>improving, follow-up BMP   8: Hypertension: monitor TID and prn             -see meds #12   9: Hyperlipidemia: continue statin   10: CAD s/p PCI to RCA 2021; to LAD on 8/07: continue DAPT and statin   11: Aortic stenosis s/p TAVR 8/15: DAPT continues   12: Acute  systolic CHF; EF ~40-98%; Lasix discontinued             -daily weight             -continue Farxiga 10 mg daily             -continue spironlactone 25 mg daily             -continue Entresto 49-51 BID (this dose started 8/26)   13: Urinary retention: most likely due to constipation and immobility             -Foley now out   14: Refractory VT resolved: continue amiodarone 200 mg BID   15: Carotid stenosis s/p right CEA 2020             -follow-up with [facility] as per protocol   16: Anemia; acute blood loss/other: follow-up CBC   17: Acute DVT left internal jugular>>resolved on recent venous duplex, superficial cephalic vein thrombosis present             -plan is to continue triple therapy and discontinue aspirin 30 days  post-PCI              -continue Eliquis 5 mg BID   18: Hx partial vulvectomies with difficult Foley cath placement:  -urology consulted>>Foley placed; removed 8/27   19: Sepsis due to pneumonia>>Enterobacter isolated, antibiotics completed             -Incentive spirometry every 2 hours while awake             -As needed oxygen, wean as tolerated   20: Anemia, multifactorial: Hgb reviewed and is stable, repeat Monday   21: Thrombocytosis: trending upward; follow-up CBC  22. Left pleural effusion: lasix 20mg  daily ordered  23. Shortness of breath: increased oxygen to 2L  24. History of prediabetes: currently well controlled, d/c CBG checks.   25. Screening for vitamin D deficiency: add on vitamin D level  I spent >65mins performing patient care related activities, including face to face time, documentation time, med management, discussion of meds and lab orders with patient, and overall coordination of care.    LOS: 1 days A FACE TO FACE EVALUATION WAS PERFORMED  Horton Chin 02/19/2023, 9:43 AM

## 2023-02-19 NOTE — Evaluation (Signed)
Occupational Therapy Assessment and Plan  Patient Details  Name: Madison West MRN: [id number] Date of Birth: 03/04/1956  OT Diagnosis: acute pain, cognitive deficits, muscle weakness (generalized), and swelling of limb Rehab Potential: Rehab Potential (ACUTE ONLY): Good ELOS: 7-10 days   Today's Date: 02/19/2023 OT Individual Time: 4098-1191 OT Individual Time Calculation (min): 70 min     Hospital Problem: Principal Problem:   Debility   Past Medical History:  Past Medical History:  Diagnosis Date   Anxiety    Arthritis    low back and hip pain intermittent   Breast cancer (HCC) 06/02/07   r breast -surgery ,radiaology. chemotherapy   Carpal tunnel syndrome    right hand   Colon polyps    Complication of anesthesia    Fentanyl, Versed-makes extra hyper, bradycardia x 1 in PACU, [facility] (08/15/11 cardiology felt neostigmine may have resulted in AV nodal block)    Coronary artery disease    Depression    denies   Dysplasia of vulva    Hypertension    Palpitations    PSVT, s/p adenosine 08/04/16   S/P breast lumpectomy 07/04/07   R breast   S/P radiation therapy 2009   Past Surgical History:  Past Surgical History:  Procedure Laterality Date   APPENDECTOMY     age 67   CESAREAN SECTION     x 2   COLONOSCOPY W/ POLYPECTOMY     COLONOSCOPY WITH PROPOFOL N/A 04/27/2016   Procedure: COLONOSCOPY WITH PROPOFOL;  Surgeon: Charolett Bumpers, MD;  Location: [facility];  Service: Endoscopy;  Laterality: N/A;   CORONARY STENT INTERVENTION N/A 01/29/2023   Procedure: CORONARY STENT INTERVENTION;  Surgeon: Elder Negus, MD;  Location: [facility];  Service: Cardiovascular;  Laterality: N/A;   CORONARY THROMBECTOMY N/A 01/31/2023   Procedure: Coronary Thrombectomy;  Surgeon: Elder Negus, MD;  Location: [facility];  Service: Cardiovascular;  Laterality: N/A;   CORONARY ULTRASOUND/IVUS N/A 01/31/2023   Procedure: Coronary Ultrasound/IVUS;  Surgeon:  Elder Negus, MD;  Location: [facility];  Service: Cardiovascular;  Laterality: N/A;   CORONARY/GRAFT ACUTE MI REVASCULARIZATION N/A 01/31/2023   Procedure: Coronary/Graft Acute MI Revascularization;  Surgeon: Elder Negus, MD;  Location: [facility];  Service: Cardiovascular;  Laterality: N/A;   DILATION AND CURETTAGE OF UTERUS     multiple   ENDARTERECTOMY Right 06/09/2019   ENDARTERECTOMY Right 06/09/2019   Procedure: ENDARTERECTOMY CAROTID RIGHT;  Surgeon: Sherren Kerns, MD;  Location: [facility] OR;  Service: Vascular;  Laterality: Right;   IABP INSERTION N/A 02/02/2023   Procedure: IABP Insertion;  Surgeon: Elder Negus, MD;  [facility]: [facility];  Service: Cardiovascular;  Laterality: N/A;   INTRAUTERINE DEVICE INSERTION     IUD REMOVAL     LEFT HEART CATH AND CORONARY ANGIOGRAPHY N/A 02/27/2020   Procedure: LEFT HEART CATH AND CORONARY ANGIOGRAPHY;  Surgeon: Yates Decamp, MD;  [facility]: [facility];  Service: Cardiovascular;  Laterality: N/A;   LEFT HEART CATH AND CORONARY ANGIOGRAPHY N/A 01/31/2023   Procedure: LEFT HEART CATH AND CORONARY ANGIOGRAPHY;  Surgeon: Elder Negus, MD;  [facility];  Service: Cardiovascular;  Laterality: N/A;   MASTECTOMY PARTIAL / LUMPECTOMY W/ AXILLARY LYMPHADENECTOMY Right    lumpectomy and lymph nodes removed   PATCH ANGIOPLASTY Right 06/09/2019   Procedure: Patch Angioplasty of right carotid artery using hemashield paltinum finesse patch;  Surgeon: Sherren Kerns, MD;  Location: [facility] OR;  Service: Vascular;  Laterality: Right;   RIGHT AND LEFT HEART CATH N/A 02/01/2023   Procedure: RIGHT AND LEFT HEART CATH;  Surgeon: Elder Negus, MD;  [facility];  Service: Cardiovascular;  Laterality: N/A;   RIGHT HEART CATH N/A 02/02/2023   Procedure: RIGHT HEART CATH;  Surgeon: Dolores Patty, MD;  [facility]: [facility];  Service: Cardiovascular;  Laterality:  N/A;   RIGHT/LEFT HEART CATH AND CORONARY ANGIOGRAPHY N/A 01/27/2023   Procedure: RIGHT/LEFT HEART CATH AND CORONARY ANGIOGRAPHY;  Surgeon: Elder Negus, MD;  [facility];  Service: Cardiovascular;  Laterality: N/A;   TEE WITHOUT CARDIOVERSION N/A 02/04/2023   Procedure: TRANSESOPHAGEAL ECHOCARDIOGRAM;  Surgeon: Orbie Pyo, MD;  Location: [facility] INVASIVE CV LAB;  Service: Open Heart Surgery;  Laterality: N/A;   TEMPORARY PACEMAKER N/A 02/02/2023   Procedure: TEMPORARY PACEMAKER;  Surgeon: Dolores Patty, MD;  [facility];  Service: Cardiovascular;  Laterality: N/A;   TRANSCATHETER AORTIC VALVE REPLACEMENT, TRANSFEMORAL N/A 02/04/2023   Procedure: Transcatheter Aortic Valve Replacement, Transfemoral;  Surgeon: Orbie Pyo, MD;  Location: [facility];  Service: Open Heart Surgery;  Laterality: N/A;   TUBAL LIGATION     VULVA SURGERY     Multiple times for dysplasia    Assessment & Plan Clinical Impression:  Madison West is a 67 year old female who follows with Dr. Tacey Ruiz Cardiology with a history of AS, CAD s/p PCI in 2021 who presented to the [facility] ED on 01/23/2023 complaining ongoing shortness of breath. Her BNP was elevated along with mild elevation in her troponins. No hypoxemia but some increased work of breathing with exertion. Transferred to [facility] telemetry unit and admitted to hospitalist service. CT angiogram of chest showed no evidence of pulmonary embolism but mild bronchial wall thickening. EKG showed PVC, LAE, LAFB minimal ST elevation. Continued supportive care with IV Lasix, intake and output charting daily weights. Echo updated>>now with depressed LV function and mean gradient of aortic valve of and evidence for low flow low gradient AS with calcified valve visually. Cardiology consulted on 8/07 and recommended heart cath. Given the cardiomyopathy and HFrEF, discussed the importance of guideline directed  medical therapy. She appeared to be apprehensive but willing to uptitrate medical therapy given the clinical findings. Discontinued amlodipine 2.5 mg p.o. daily. Continued losartan. Started spironolactone 25 mg p.o. daily. Cardiothoracic surgery consulted on 8/08: Per Dr. Leafy Ro, NYHA class 2-3 symptoms of ischemic cardiomyopathy complicated with low flow low gradient AS. Depressed LV function with EF 30-35% and with comorbidities that would make SAVR/CABG higher risk. He felt that TAVR work up to determine if TAVR anatomically feasable would be best management for her AS and to PCI LAD lesion. Patient underwent CT angiogram of the chest, CT coronaries and CT angio of the abdomen and pelvis prior to deciding on further course of treatment. HFrEF likely a combination of valvular and ischemic cardiomyopathy. She experienced episodes of sinus tachycardia at rest and exertion, and brief episodes of NSVT. On 8/11, she developed ventricular tachycardia with no pulse. CPR intiated; ACLS protocol with epi and defibrillated times two with ROSC. V-tach once again, shock delivered and given magnesium and calcium. Patient started on amio drip. Patient transferred to [facility] ICU and cardiology consulted. EKG showing ST elevation in anterior leads. Code STEMI. PCCM consulted. Acute stent thrombosis. [place] balloon up to 20 atm with restoration of TIMI-3 flow. Dr.  Patwardhan did not think additional stent was necessary in absence of obvious stent edge dissection, and increased risk of stent thrombosis with overlapping stents. ST elevation resolved on EKG during the procedure. EKG today showed limited septal infarct. On the evening of 8/12, she developed Ventricular bigeminy, trigeminy through the evening with episodes of NSVT for which amiodarone was started. At 10:52, she had VT arrest X2, with one she lost pulse, was successfully defibrillated. Had brief pause after defibrillation but returned to sinus rhythm with ROSC. She has no new  chest pain at this time. 12 lead EKG does not show new ST elevation, show incomplete LBBB and age indeterminate septal infarct. However, given her stent thrombosis yesterday, low EF, severe AS, Dr. Rosemary Holms recommended re-look angiography, along with RHC to assess invasive hemodynamics. Remained intubated for airway protection. Discussed with Dr. Gala Romney and IABP placed. Dr. Lynnette Caffey consulted for placement on VA ECMO support and potential TAVR. She underwent procedure on 8/15. Extubated on 8/18. PICC placed 8/19. Completed antibiotics for pneumonia on 8/20. 8/21 urology consulted secondary to difficult Foley catheter placement. History noted of multiple partial vulvectomy in the past. Acute DVT in left internal jugular along with acute superficial vein thrombosis involving the left cephalic vein, proximal upper arm to mid forearm and started on apixaban. Heart failure team adjusting medications, diuresing. Off all vasoactive medications and O2 requirements down to 4 L/min and transferred to PCU on 8/25. Noted hypotensive overnight 8/27 and required albumin. Heart failure team felt likely over diuresis. Holding Lasix and checked orthostatics. CBC and lactic acid obtained. LA + 0.6. Entresto reduced to 49-51. Continued spironolactone 25 mg daily. Foley removed 8/28. LUE venous duplex repeated on 8/28: Findings consistent with age indeterminate superficial vein thrombosis involving the left cephalic vein-essentially unchanged as compared to previous examination. Previously visualized internal jugular vein DVT appears resolved on this examination. Plan to stay off of Lasix and begin Fraxiga 10 mg daily. On apixaban for left internal jugular DVT. HF team plans to continue triple therapy for now given history of stent thrombosis. After 1 month post-PCI, plan to drop ASA. No beta blocker now for low output. Tolerating diet. Patient was able to perform transfers/ambulation around room with increased time, small steps,  with CGA and rest breaks. She was able to complete LB dressing with CGA, increased time, pain in chest when reaching/bending down. The patient requires inpatient medicine and rehabilitation evaluations and services for ongoing dysfunction secondary to debility from prolonged hospitalization due to CAD/CHF s/p TAVR. Patient transferred to CIR on 02/18/2023 .    Patient currently requires min with basic self-care skills secondary to muscle weakness, decreased cardiorespiratoy endurance and decreased oxygen support, decreased coordination, decreased awareness and decreased problem solving, and decreased standing balance.  Prior to hospitalization, patient could complete self-care independently.  Patient will benefit from skilled intervention to decrease level of assist with basic self-care skills and increase independence with basic self-care skills prior to discharge home with care partner.  Anticipate patient will require intermittent supervision and follow up home health.  OT - End of Session Activity Tolerance: Tolerates < 10 min activity, no significant change in vital signs Endurance Deficit: Yes Endurance Deficit Description: significant deconditioning OT Assessment Rehab Potential (ACUTE ONLY): Good OT Barriers to Discharge: Incontinence;New oxygen;Home environment access/layout;Decreased caregiver support OT Patient demonstrates impairments in the following area(s): Balance;Cognition;Edema;Endurance;Motor;Pain OT Basic ADL's Functional Problem(s): Bathing;Dressing;Toileting OT Advanced ADL's Functional Problem(s): Simple Meal Preparation OT Transfers Functional Problem(s): Toilet;Tub/Shower OT Additional Impairment(s): None OT Plan  OT Intensity: Minimum of 1-2 x/day, 45 to 90 minutes OT Frequency: 5 out of 7 days OT Duration/Estimated Length of Stay: 7-10 days OT Treatment/Interventions: Balance/vestibular training;Discharge planning;Pain management;Self Care/advanced ADL retraining;UE/LE  Coordination activities;Therapeutic Activities;Disease mangement/prevention;Functional mobility training;Patient/family education;Therapeutic Exercise;DME/adaptive equipment instruction;Neuromuscular re-education;Psychosocial support;UE/LE Strength taining/ROM OT Self Feeding Anticipated Outcome(s): Indep OT Basic Self-Care Anticipated Outcome(s): Mod I/supervision OT Toileting Anticipated Outcome(s): Mod I OT Bathroom Transfers Anticipated Outcome(s): Mod I OT Recommendation Recommendations for Other Services: Neuropsych consult;Therapeutic Recreation consult Therapeutic Recreation Interventions: Pet therapy Patient destination: Home Follow Up Recommendations: Home health OT Equipment Recommended: To be determined   OT Evaluation Precautions/Restrictions  Precautions Precautions: Fall Precaution Comments: monitor O2 sat Restrictions Weight Bearing Restrictions: No RLE Weight Bearing: Weight bearing as tolerated Home Living/Prior Functioning Home Living Family/patient expects to be discharged to:: Private residence Living Arrangements: Children Available Help at Discharge: Family, Available PRN/intermittently (Daughters work during day, unable to take off so pt will need to be indep during day) Type of Home: House Home Access: Stairs to enter Secretary/administrator of Steps: 2 Entrance Stairs-Rails: Can reach both Home Layout: One level Bathroom Shower/Tub: Tub/shower unit, Walk-in shower (tub/shower has curtain, walk in shower has door) Teacher, early years/pre: Yes  Lives With: Daughter (2 adult daughters live with pt at baseline (but 1 is expecting to move out in the future but unsure time)) IADL History Homemaking Responsibilities: Yes Meal Prep Responsibility: Primary Current License: Yes Type of Occupation: bartending Prior Function Level of Independence: Independent with basic ADLs, Independent with gait, Independent with homemaking with  ambulation, Independent with transfers  Able to Take Stairs?: Yes Vision Baseline Vision/History: 0 No visual deficits Ability to See in Adequate Light: 0 Adequate Patient Visual Report: No change from baseline Vision Assessment?: No apparent visual deficits Perception  Perception: Within Functional Limits Praxis Praxis: WFL Cognition Cognition Overall Cognitive Status: Within Functional Limits for tasks assessed Arousal/Alertness: Awake/alert Orientation Level: Person;Place;Situation Person: Oriented Place: Oriented Situation: Oriented Memory: Appears intact Awareness: Impaired Problem Solving: Impaired Safety/Judgment: Appears intact Brief Interview for Mental Status (BIMS) Repetition of Three Words (First Attempt): 3 Temporal Orientation: Year: Correct Temporal Orientation: Month: Accurate within 5 days Temporal Orientation: Day: Correct Recall: "Sock": Yes, no cue required Recall: "Blue": Yes, no cue required Recall: "Bed": Yes, no cue required BIMS Summary Score: 15 Sensation Sensation Light Touch: Appears Intact Hot/Cold: Not tested Proprioception: Appears Intact Stereognosis: Not tested Coordination Gross Motor Movements are Fluid and Coordinated: No Fine Motor Movements are Fluid and Coordinated: Yes Motor  Motor Motor: Within Functional Limits  Trunk/Postural Assessment  Cervical Assessment Cervical Assessment: Exceptions to [facility] (forward head) Thoracic Assessment Thoracic Assessment: Exceptions to [facility] (thoracic rounding) Lumbar Assessment Lumbar Assessment: Within Functional Limits Postural Control Postural Control: Within Functional Limits  Balance Balance Balance Assessed: Yes Static Sitting Balance Static Sitting - Balance Support: Feet supported;No upper extremity supported Static Sitting - Level of Assistance: 5: Stand by assistance (Supervision) Dynamic Sitting Balance Dynamic Sitting - Balance Support: Feet supported;During functional  activity;No upper extremity supported Dynamic Sitting - Level of Assistance: 5: Stand by assistance (CGA) Static Standing Balance Static Standing - Balance Support: No upper extremity supported;During functional activity Static Standing - Level of Assistance: 5: Stand by assistance (CGA) Dynamic Standing Balance Dynamic Standing - Balance Support: During functional activity;No upper extremity supported Dynamic Standing - Level of Assistance: 4: Min assist Extremity/Trunk Assessment RUE Assessment RUE Assessment: Exceptions to [facility] General Strength Comments: 4-/5 grossly LUE Assessment LUE Assessment: Exceptions to  WFL General Strength Comments: 4-/5 grossly  Care Tool Care Tool Self Care Eating   Eating Assist Level: Set up assist    Oral Care    Oral Care Assist Level: Set up assist    Bathing   Body parts bathed by patient: Right arm;Left arm;Chest;Abdomen;Front perineal area;Buttocks;Right upper leg;Left upper leg;Face Body parts bathed by helper: Right lower leg;Left lower leg   Assist Level: Minimal Assistance - Patient > 75%    Upper Body Dressing(including orthotics)   What is the patient wearing?: Pull over shirt;Bra   Assist Level: Minimal Assistance - Patient > 75%    Lower Body Dressing (excluding footwear)   What is the patient wearing?: Underwear/pull up;Pants Assist for lower body dressing: Minimal Assistance - Patient > 75%    Putting on/Taking off footwear   What is the patient wearing?: Non-skid slipper socks Assist for footwear: Total Assistance - Patient < 25%       Care Tool Toileting Toileting activity   Assist for toileting: Minimal Assistance - Patient > 75%     Care Tool Bed Mobility Roll left and right activity        Sit to lying activity        Lying to sitting on side of bed activity         Care Tool Transfers Sit to stand transfer   Sit to stand assist level: Contact Guard/Touching assist    Chair/bed transfer          Toilet transfer   Assist Level: Minimal Assistance - Patient > 75%     Care Tool Cognition  Expression of Ideas and Wants Expression of Ideas and Wants: 4. Without difficulty (complex and basic) - expresses complex messages without difficulty and with speech that is clear and easy to understand  Understanding Verbal and Non-Verbal Content Understanding Verbal and Non-Verbal Content: 3. Usually understands - understands most conversations, but misses some part/intent of message. Requires cues at times to understand   Memory/Recall Ability Memory/Recall Ability : Current season;That he or she is in a hospital/hospital unit   Refer to Care Plan for Long Term Goals  SHORT TERM GOAL WEEK 1 OT Short Term Goal 1 (Week 1): STG = LTG 2/2 ELOS  Recommendations for other services: Neuropsych and Therapeutic Recreation  Pet therapy   Skilled Therapeutic Intervention Patient received semi reclined in bed upon therapy arrival and agreeable to participate in OT evaluation. Education provided on OT purpose, therapy schedule, goals for therapy, and safety policy while in rehab. Discomfort in Lt flank reported, no pain meds requested. OT offered repositioning during session. Pt received on 1 L with SPO2 at 91%, however pt expressing SOB, with visible dyspnea at rest. MD present for rounds, with verbal order to wean slowly but to stay above 88%, unless discomfort is present then to maintain 2L. Changed to 2L in prep for mobility, with improved SOB, however needed to remain on 2L for fatigue and breathing discomfort with SPO2 > 91% with activity.  Patient demonstrates significant cardiovascular/respiratory endurance deficits, as well as dynamic balance, GMC/strength and cognitive (awareness, problem solving) deficits resulting in difficulty completing BADL tasks without increased physical assist. Pt will benefit from skilled OT services to focus on mentioned deficits. See below for ADL and functional transfer  performance. Pt was continent of urinary void. Cues needed throughout for attending to task as pt quite tangential. She also frequently requested items, but then immediately stated "I didn't ask for that," demonstrating some level  of poor recall. Pt remained seated in recliner at conclusion of session with belt alarm on and all needs met at end of session.    ADL ADL Eating: Set up Where Assessed-Eating: Chair Grooming: Setup Where Assessed-Grooming: Edge of bed Upper Body Bathing: Supervision/safety Where Assessed-Upper Body Bathing: Edge of bed Lower Body Bathing: Minimal assistance Where Assessed-Lower Body Bathing: Edge of bed Upper Body Dressing: Minimal assistance Where Assessed-Upper Body Dressing: Edge of bed Lower Body Dressing: Minimal assistance Where Assessed-Lower Body Dressing: Other (Comment) (BSC) Toileting: Minimal assistance Where Assessed-Toileting: Bedside Commode Toilet Transfer: Minimal assistance Toilet Transfer Method: Proofreader: Bedside commode Tub/Shower Transfer: Unable to assess Tub/Shower Transfer Method: Unable to assess Film/video editor: Unable to assess Film/video editor Method: Unable to assess Mobility  Bed Mobility Bed Mobility: Left Sidelying to Sit;Sitting - Scoot to Edge of Bed Left Sidelying to Sit: Supervision/Verbal cueing Sitting - Scoot to Edge of Bed: Supervision/Verbal cueing Transfers Sit to Stand: Contact Guard/Touching assist   Discharge Criteria: Patient will be discharged from OT if patient refuses treatment 3 consecutive times without medical reason, if treatment goals not met, if there is a change in medical status, if patient makes no progress towards goals or if patient is discharged from hospital.  The above assessment, treatment plan, treatment alternatives and goals were discussed and mutually agreed upon: by patient  Melvyn Novas, MS, OTR/L  02/19/2023, 12:25 PM

## 2023-02-19 NOTE — Evaluation (Signed)
Speech Language Pathology Assessment and Plan  Patient Details  Name: Madison West MRN: [id number] Date of Birth: 1956/01/18  SLP Diagnosis: Cognitive Impairments  Rehab Potential: Excellent ELOS: 14-16 days    Today's Date: 02/19/2023 SLP Individual Time: 1308-6578 SLP Individual Time Calculation (min): 60 min   Hospital Problem: Principal Problem:   Debility  Past Medical History:  Past Medical History:  Diagnosis Date   Anxiety    Arthritis    low back and hip pain intermittent   Breast cancer (HCC) 06/02/07   r breast -surgery ,radiaology. chemotherapy   Carpal tunnel syndrome    right hand   Colon polyps    Complication of anesthesia    Fentanyl, Versed-makes extra hyper, bradycardia x 1 in PACU, [facility] (08/15/11 cardiology felt neostigmine may have resulted in AV nodal block)    Coronary artery disease    Depression    denies   Dysplasia of vulva    Hypertension    Palpitations    PSVT, s/p adenosine 08/04/16   S/P breast lumpectomy 07/04/07   R breast   S/P radiation therapy 2009   Past Surgical History:  Past Surgical History:  Procedure Laterality Date   APPENDECTOMY     age 66   CESAREAN SECTION     x 2   COLONOSCOPY W/ POLYPECTOMY     COLONOSCOPY WITH PROPOFOL N/A 04/27/2016   Procedure: COLONOSCOPY WITH PROPOFOL;  Surgeon: Charolett Bumpers, MD;  Location: [facility];  Service: Endoscopy;  Laterality: N/A;   CORONARY STENT INTERVENTION N/A 01/29/2023   Procedure: CORONARY STENT INTERVENTION;  Surgeon: Elder Negus, MD;  Location: [facility];  Service: Cardiovascular;  Laterality: N/A;   CORONARY THROMBECTOMY N/A 01/31/2023   Procedure: Coronary Thrombectomy;  Surgeon: Elder Negus, MD;  Location: [facility];  Service: Cardiovascular;  Laterality: N/A;   CORONARY ULTRASOUND/IVUS N/A 01/31/2023   Procedure: Coronary Ultrasound/IVUS;  Surgeon: Elder Negus, MD;  Location: [facility];  Service:  Cardiovascular;  Laterality: N/A;   CORONARY/GRAFT ACUTE MI REVASCULARIZATION N/A 01/31/2023   Procedure: Coronary/Graft Acute MI Revascularization;  Surgeon: Elder Negus, MD;  [facility]: [facility];  Service: Cardiovascular;  Laterality: N/A;   DILATION AND CURETTAGE OF UTERUS     multiple   ENDARTERECTOMY Right 06/09/2019   ENDARTERECTOMY Right 06/09/2019   Procedure: ENDARTERECTOMY CAROTID RIGHT;  Surgeon: Sherren Kerns, MD;  Location[facility];  Service: Vascular;  Laterality: Right;   IABP INSERTION N/A 02/02/2023   Procedure: IABP Insertion;  Surgeon: Elder Negus, MD;  [facility]: [facility];  Service: Cardiovascular;  Laterality: N/A;   INTRAUTERINE DEVICE INSERTION     IUD REMOVAL     LEFT HEART CATH AND CORONARY ANGIOGRAPHY N/A 02/27/2020   Procedure: LEFT HEART CATH AND CORONARY ANGIOGRAPHY;  Surgeon: Yates Decamp, MD;  [facility]: [facility];  Service: Cardiovascular;  Laterality: N/A;   LEFT HEART CATH AND CORONARY ANGIOGRAPHY N/A 01/31/2023   Procedure: LEFT HEART CATH AND CORONARY ANGIOGRAPHY;  Surgeon: Elder Negus, MD;  [facility]: [facility];  Service: Cardiovascular;  Laterality: N/A;   MASTECTOMY PARTIAL / LUMPECTOMY W/ AXILLARY LYMPHADENECTOMY Right    lumpectomy and lymph nodes removed   PATCH ANGIOPLASTY Right 06/09/2019   Procedure: Patch Angioplasty of right carotid artery using hemashield paltinum finesse patch;  Surgeon: Sherren Kerns, MD;  Location: [facility] OR;  Service: Vascular;  Laterality: Right;   RIGHT AND  LEFT HEART CATH N/A 02/01/2023   Procedure: RIGHT AND LEFT HEART CATH;  Surgeon: Elder Negus, MD;  [facility];  Service: Cardiovascular;  Laterality: N/A;   RIGHT HEART CATH N/A 02/02/2023   Procedure: RIGHT HEART CATH;  Surgeon: Dolores Patty, MD;  [facility];  Service: Cardiovascular;  Laterality: N/A;   RIGHT/LEFT HEART CATH AND CORONARY ANGIOGRAPHY N/A 01/27/2023    Procedure: RIGHT/LEFT HEART CATH AND CORONARY ANGIOGRAPHY;  Surgeon: Elder Negus, MD;  [facility];  Service: Cardiovascular;  Laterality: N/A;   TEE WITHOUT CARDIOVERSION N/A 02/04/2023   Procedure: TRANSESOPHAGEAL ECHOCARDIOGRAM;  Surgeon: Orbie Pyo, MD;  Location: [facility] INVAS[facility] CV LAB;  Service: Open Heart Surgery;  Laterality: N/A;   TEMPORARY PACEMAKER N/A 02/02/2023   Procedure: TEMPORARY PACEMAKER;  Surgeon: Dolores Patty, MD;  [facility];  Service: Cardiovascular;  Laterality: N/A;   TRANSCATHETER AORTIC VALVE REPLACEMENT, TRANSFEMORAL N/A 02/04/2023   Procedure: Transcatheter Aortic Valve Replacement, Transfemoral;  Surgeon: Orbie Pyo, MD;  [facility]: [facility];  Service: Open Heart Surgery;  Laterality: N/A;   TUBAL LIGATION     VULVA SURGERY     Multiple times for dysplasia    Assessment / Plan / Recommendation Clinical Impression   Madison West is a 67 year old female who follows with Dr. Tacey West Cardiology with a history of AS, CAD s/p PCI in 2021 who presented to the [facility] ED on 01/23/2023 complaining ongoing shortness of breath. Her BNP was elevated along with mild elevation in her troponins. No hypoxemia but some increased work of breathing with exertion. Transferred to [facility] telemetry unit and admitted to hospitalist service. CT angiogram of chest showed no evidence of pulmonary embolism but mild bronchial wall thickening. EKG showed PVC, LAE, LAFB minimal ST elevation. Continued supportive care with IV Lasix, intake and output charting daily weights. Echo updated>>now with depressed LV function and mean gradient of aortic valve of and evidence for low flow low gradient AS with calcified valve visually. Cardiology consulted on 8/07 and recommended heart cath. Given the cardiomyopathy and HFrEF, discussed the importance of guideline directed medical therapy. She appeared to be apprehensive but  willing to uptitrate medical therapy given the clinical findings. Discontinued amlodipine 2.5 mg p.o. daily. Continued losartan. Started spironolactone 25 mg p.o. daily. Cardiothoracic surgery consulted on 8/08: Per Dr. Leafy Ro, NYHA class 2-3 symptoms of ischemic cardiomyopathy complicated with low flow low gradient AS. Depressed LV function with EF 30-35% and with comorbidities that would make SAVR/CABG higher risk. He felt that TAVR work up to determine if TAVR anatomically feasable would be best management for her AS and to PCI LAD lesion. Patient underwent CT angiogram of the chest, CT coronaries and CT angio of the abdomen and pelvis prior to deciding on further course of treatment. HFrEF likely a combination of valvular and ischemic cardiomyopathy. She experienced episodes of sinus tachycardia at rest and exertion, and brief episodes of NSVT. On 8/11, she developed ventricular tachycardia with no pulse. CPR intiated; ACLS protocol with epi and defibrillated times two with ROSC. V-tach once again, shock delivered and given magnesium and calcium. Patient started on amio drip. Patient transferred to [facility] [place] ICU and cardiology consulted. EKG showing ST elevation in anterior leads. Code STEMI. PCCM consulted. Acute stent thrombosis. [place] balloon up to 20 atm with restoration of TIMI-3 flow. Dr. Rosemary Holms did not think additional stent was necessary in absence  of obvious stent edge dissection, and increased risk of stent thrombosis with overlapping stents. ST elevation resolved on EKG during the procedure. EKG today showed limited septal infarct. On the evening of 8/12, she developed Ventricular bigeminy, trigeminy through the evening with episodes of NSVT for which amiodarone was started. At 10:52, she had VT arrest X2, with one she lost pulse, was successfully defibrillated. Had brief pause after defibrillation but returned to sinus rhythm with ROSC. She has no new chest pain at this time. 12 lead EKG does not show new  ST elevation, show incomplete LBBB and age indeterminate septal infarct. However, given her stent thrombosis yesterday, low EF, severe AS, Dr. Rosemary Holms recommended re-look angiography, along with RHC to assess invasive hemodynamics. Remained intubated for airway protection. Discussed with Dr. Gala Romney and IABP placed. Dr. Lynnette Caffey consulted for placement on VA ECMO support and potential TAVR. She underwent procedure on 8/15. Extubated on 8/18. PICC placed 8/19. Completed antibiotics for pneumonia on 8/20. 8/21 urology consulted secondary to difficult Foley catheter placement. History noted of multiple partial vulvectomy in the past. Acute DVT in left internal jugular along with acute superficial vein thrombosis involving the left cephalic vein, proximal upper arm to mid forearm and started on apixaban. Heart failure team adjusting medications, diuresing. Off all vasoactive medications and O2 requirements down to 4 L/min and transferred to PCU on 8/25. Noted hypotensive overnight 8/27 and required albumin. Heart failure team felt likely over diuresis. Holding Lasix and checked orthostatics. CBC and lactic acid obtained. LA + 0.6. Entresto reduced to 49-51. Continued spironolactone 25 mg daily. Foley removed 8/28. LUE venous duplex repeated on 8/28: Findings consistent with age indeterminate superficial vein thrombosis involving the left cephalic vein-essentially unchanged as compared to previous examination. Previously visualized internal jugular vein DVT appears resolved on this examination. Plan to stay off of Lasix and begin Fraxiga 10 mg daily. On apixaban for left internal jugular DVT. HF team plans to continue triple therapy for now given history of stent thrombosis. After 1 month post-PCI, plan to drop ASA. No beta blocker now for low output. Tolerating diet. Patient was able to perform transfers/ambulation around room with increased time, small steps, with CGA and rest breaks. She was able to complete LB  dressing with CGA, increased time, pain in chest when reaching/bending down. The patient requires inpatient medicine and rehabilitation evaluations and services for ongoing dysfunction secondary to debility from prolonged hospitalization due to CAD/CHF s/p TAVR.    Skilled Therapeutic Interventions          Pt seen for skilled SLP intervention to complete formal cognitive-linguistic assessment per provider orders. Pt denied hx of cognitive impairment at baseline and does not feel that she has had acute changes in her cognition at this time. Conversational speech 100% fluent and intelligible with no anomia or dysarthria noted. She is short of breath during running speech and was encouraged to take pauses in between sentences. Her shortness of breath did not impact her speech intelligibility. Pt has been tolerating a regular diet and thin liquids without concerns for aspiration per chart.   Pt scored a 21/30 on [employer] Mental Status (SLUMS) indicating mild cognitive impairment. Pt challenged with attention, short term recall, and executive function tasks. She was aware of her errors and slightly distressed by them stating, "I remember everything". Pt currently works part-time as a Leisure centre manager and is able to multi-task to complete her job effectively. She hopes to return to work once discharged given she loves her  job.   Discussed option to complete trial cognitive therapy to focus on functional tasks that pt completed independently at baseline including, financial management, medication management, and daily problem solving. Pt in agreement. She did appear anxious about her results on this assessment and perseverated on the areas that she was challenged with. Encouragement and reassurance provided.   SLUMS:  Orientation: 3/3 Problem Solving: 3/3 Attention: Generative naming: 2/3 Delayed Recall: 3/5 Serial sequence reversal: 0/2 Clock Drawing: 2/4 Visuospatial: 2/2 Story Recall: 6/8  Total  Score: 21/30  Pt left sitting upright in reclined with chair alarm activated and call bell in reach. Continue SLP PoC.     SLP Assessment  Patient will need skilled Speech Lanaguage Pathology Services during CIR admission    Recommendations  Patient destination: Home Follow up Recommendations: None Equipment Recommended: To be determined    SLP Frequency 3 to 5 out of 7 days   SLP Duration  SLP Intensity  SLP Treatment/Interventions 14-16 days  Minumum of 1-2 x/day, 30 to 90 minutes  Cognitive remediation/compensation;Cueing hierarchy;Medication managment;Internal/external aids;Therapeutic Activities;Functional tasks    Pain Pain Assessment Pain Scale: 0-10 Pain Score: 0-No pain  Prior Functioning Cognitive/Linguistic Baseline: Within functional limits Type of Home: House  Lives With: Daughter Available Help at Discharge: Family;Available PRN/intermittently Education: Two associates degrees Vocation: Part time employment  SLP Evaluation Cognition Overall Cognitive Status: Impaired/Different from baseline Arousal/Alertness: Awake/alert Orientation Level: Oriented X4 Year: 2024 Day of Week: Correct Attention: Focused;Sustained Focused Attention: Appears intact Sustained Attention: Impaired Sustained Attention Impairment: Verbal complex Memory: Impaired Memory Impairment: Decreased recall of new information;Decreased short term memory Decreased Short Term Memory: Verbal basic;Verbal complex Awareness: Appears intact Problem Solving: Impaired Problem Solving Impairment: Verbal complex;Functional complex Executive Function: Organizing;Self Correcting Organizing: Impaired Organizing Impairment: Verbal complex Self Correcting: Impaired Self Correcting Impairment: Verbal complex Behaviors:  (appeared anxious) Safety/Judgment: Appears intact  Comprehension Auditory Comprehension Overall Auditory Comprehension: Appears within functional limits for tasks  assessed Commands: Within Functional Limits Conversation: Simple Expression Expression Primary Mode of Expression: Verbal Verbal Expression Overall Verbal Expression: Appears within functional limits for tasks assessed Initiation: No impairment Level of Generative/Spontaneous Verbalization: Word Pragmatics: No impairment Oral Motor Oral Motor/Sensory Function Overall Oral Motor/Sensory Function: Within functional limits Motor Speech Overall Motor Speech: Appears within functional limits for tasks assessed Respiration: Impaired Level of Impairment: Conversation Phonation:  (reduced breath support for running speech, encouraged to take pauses) Resonance: Within functional limits Articulation: Within functional limitis Intelligibility: Intelligible Motor Planning: Witnin functional limits Motor Speech Errors: Not applicable Effective Techniques: Pause (due to reduced breath support secodnary to debility)  Care Tool Care Tool Cognition Ability to hear (with hearing aid or hearing appliances if normally used Ability to hear (with hearing aid or hearing appliances if normally used): 0. Adequate - no difficulty in normal conservation, social interaction, listening to TV   Expression of Ideas and Wants Expression of Ideas and Wants: 4. Without difficulty (complex and basic) - expresses complex messages without difficulty and with speech that is clear and easy to understand   Understanding Verbal and Non-Verbal Content Understanding Verbal and Non-Verbal Content: 3. Usually understands - understands most conversations, but misses some part/intent of message. Requires cues at times to understand  Memory/Recall Ability Memory/Recall Ability : Current season;That he or she is in a hospital/hospital unit    Short Term Goals: Week 1: SLP Short Term Goal 1 (Week 1): Pt will complete moderately complex problem solving tasks with >80% accuracy given min cues. SLP Short Term  Goal 2 (Week 1): Pt  will utilize compensatory memory strategies and aids to recall day to day information and complete memory exercises with >80% accuracy. SLP Short Term Goal 3 (Week 1): Pt will complete medication management task with >90% accuracy given min cues. SLP Short Term Goal 4 (Week 1): Pt will complete financial management tasks with >90% accuracy given min cues.  Refer to Care Plan for Long Term Goals  Recommendations for other services: None   Discharge Criteria: Patient will be discharged from SLP if patient refuses treatment 3 consecutive times without medical reason, if treatment goals not met, if there is a change in medical status, if patient makes no progress towards goals or if patient is discharged from hospital.  The above assessment, treatment plan, treatment alternatives and goals were discussed and mutually agreed upon: by patient  Ellery Plunk 02/19/2023, 12:30 PM

## 2023-02-19 NOTE — Plan of Care (Signed)
  Problem: RH Balance Goal: LTG Patient will maintain dynamic standing with ADLs (OT) Description: LTG:  Patient will maintain dynamic standing balance with assist during activities of daily living (OT)  Flowsheets (Taken 02/19/2023 1217) LTG: Pt will maintain dynamic standing balance during ADLs with: Independent with assistive device   Problem: Sit to Stand Goal: LTG:  Patient will perform sit to stand in prep for activites of daily living with assistance level (OT) Description: LTG:  Patient will perform sit to stand in prep for activites of daily living with assistance level (OT) Flowsheets (Taken 02/19/2023 1217) LTG: PT will perform sit to stand in prep for activites of daily living with assistance level: Independent with assistive device   Problem: RH Bathing Goal: LTG Patient will bathe all body parts with assist levels (OT) Description: LTG: Patient will bathe all body parts with assist levels (OT) Flowsheets (Taken 02/19/2023 1217) LTG: Pt will perform bathing with assistance level/cueing: Supervision/Verbal cueing   Problem: RH Dressing Goal: LTG Patient will perform upper body dressing (OT) Description: LTG Patient will perform upper body dressing with assist, with/without cues (OT). Flowsheets (Taken 02/19/2023 1217) LTG: Pt will perform upper body dressing with assistance level of: Set up assist Goal: LTG Patient will perform lower body dressing w/assist (OT) Description: LTG: Patient will perform lower body dressing with assist, with/without cues in positioning using equipment (OT) Flowsheets (Taken 02/19/2023 1217) LTG: Pt will perform lower body dressing with assistance level of: Independent with assistive device   Problem: RH Toileting Goal: LTG Patient will perform toileting task (3/3 steps) with assistance level (OT) Description: LTG: Patient will perform toileting task (3/3 steps) with assistance level (OT)  Flowsheets (Taken 02/19/2023 1217) LTG: Pt will perform  toileting task (3/3 steps) with assistance level: Independent with assistive device   Problem: RH Simple Meal Prep Goal: LTG Patient will perform simple meal prep w/assist (OT) Description: LTG: Patient will perform simple meal prep with assistance, with/without cues (OT). Flowsheets (Taken 02/19/2023 1217) LTG: Pt will perform simple meal prep with assistance level of: Supervision/Verbal cueing   Problem: RH Toilet Transfers Goal: LTG Patient will perform toilet transfers w/assist (OT) Description: LTG: Patient will perform toilet transfers with assist, with/without cues using equipment (OT) Flowsheets (Taken 02/19/2023 1217) LTG: Pt will perform toilet transfers with assistance level of: Independent with assistive device   Problem: RH Tub/Shower Transfers Goal: LTG Patient will perform tub/shower transfers w/assist (OT) Description: LTG: Patient will perform tub/shower transfers with assist, with/without cues using equipment (OT) Flowsheets (Taken 02/19/2023 1217) LTG: Pt will perform tub/shower stall transfers with assistance level of: Supervision/Verbal cueing   Problem: RH Awareness Goal: LTG: Patient will demonstrate awareness during functional activites type of (OT) Description: LTG: Patient will demonstrate awareness during functional activites type of (OT) Flowsheets (Taken 02/19/2023 1217) Patient will demonstrate awareness during functional activites type of: Anticipatory LTG: Patient will demonstrate awareness during functional activites type of (OT): Modified Independent

## 2023-02-20 MED ORDER — DM-GUAIFENESIN ER 30-600 MG PO TB12
1.0000 | ORAL_TABLET | Freq: Two times a day (BID) | ORAL | Status: DC
  Administered 2023-02-20 – 2023-03-05 (×27): 1 via ORAL
  Filled 2023-02-20 (×27): qty 1

## 2023-02-20 NOTE — Progress Notes (Signed)
Physical Therapy Session Note  Patient Details  Name: Madison West MRN: [id number] Date of Birth: 03/16/1956  Today's Date: 02/20/2023 PT Individual Time: 1300-1415 PT Individual Time Calculation (min): 75 min   Short Term Goals: Week 1:  PT Short Term Goal 1 (Week 1): STG=LTG due to LOS  Skilled Therapeutic Interventions/Progress Updates: Pt presents sitting in recliner visiting w/ friend.  Pt agreeable to therapy.  Pt transfers sit to stand w/ RW and CGA.  Pt amb to BR w/ CGA.  Pt transfers on/off toilet w/ CGA.  Pt continent of bladder and performs pericare w/ supervision, NT notified but PT noted in Flowsheets.  Pt amb x 120' w/ RW and CGA to small gym.  O2 sats on 2 LPM remained > 93%.  Pt performed car transfer at sedan height w/ min to CGA.  PT retrieved rollator and adjusted for height .  Pt educated on brakes, handles and positioning against solid object (wall, mat table) before sitting.  Pt amb x 150' to main gym w/ CGA.  Pt amb x 10' to stairs w/o AD and CGA.  Pt negotiated 4 steps w/ B rails and min A, step-to gait pattern for fatigue.  Pt amb 32' w/ rollator w/ PT just requesting to sit down.  Pt performed all positioning/safety for stand to sit but unlocks brakes after standing, before turning in rollator.  Pt amb to room and returned to sitting in recliner w/ chair alarm on and LES elevated.  Handed off to NT for vitals and will move table when finished     Therapy Documentation Precautions:  Precautions Precautions: Fall Precaution Comments: monitor O2 sat Restrictions Weight Bearing Restrictions: No RLE Weight Bearing: Weight bearing as tolerated General:   Vital Signs: Therapy Vitals Temp: 98.3 F (36.8 C) Pulse Rate: 73 Resp: 16 BP: (!) 117/54 Patient Position (if appropriate): Sitting Oxygen Therapy SpO2: 95 % O2 Device: Nasal Cannula Pain:soreness chest Pain Assessment Pain Scale: 0-10 Pain Score: 0-No pain     Therapy/Group: Individual  Therapy  Lucio Edward 02/20/2023, 2:20 PM

## 2023-02-20 NOTE — Progress Notes (Signed)
Speech Language Pathology Daily Session Note  Patient Details  Name: Madison West MRN: [id number] Date of Birth: 1956/01/25  Today's Date: 02/20/2023 SLP Individual Time: 8469-6295 SLP Individual Time Calculation (min): 45 min  Short Term Goals: Week 1: SLP Short Term Goal 1 (Week 1): Pt will complete moderately complex problem solving tasks with >80% accuracy given min cues. SLP Short Term Goal 2 (Week 1): Pt will utilize compensatory memory strategies and aids to recall day to day information and complete memory exercises with >80% accuracy. SLP Short Term Goal 3 (Week 1): Pt will complete medication management task with >90% accuracy given min cues. SLP Short Term Goal 4 (Week 1): Pt will complete financial management tasks with >90% accuracy given min cues.  Skilled Therapeutic Interventions:  Pt seen for skilled ST targeting cognition goals. She was very talkative during ST session and required frequent redirection to attend to therapy thus limiting how much practice could be completed. 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 SLP provided verbal education with an external memory aid for WRAP compensatory memory strategies along with examples. Pt states she would like to write things down to help with recall - SLP encouraged pt to write notes on daily schedules and provided orientation to the schedule. Pt then able to navigate schedule with 100% acc independently. Pt endorsed new word finding difficulties so SLP provided education and several models on circumlocution strategy. Pt verbalized understanding via teachback but due to time constraints, practice on this strategy was not completed. Pt also stated she is concerned about the number of new medications she is taking since hospitalization. SLP also provided education on cognitive changes that can occur following heart attack and the importance of practicing daily tasks while at CIR; pt verbalized  understanding.   Pt left in bed with call bell in reach and bed alarm activated. Continue ST POC.    Pain Pain Assessment Pain Scale: 0-10 Pain Score: 0-No pain  Therapy/Group: Individual Therapy  Alphonsus Sias 02/20/2023, 10:49 AM

## 2023-02-20 NOTE — Progress Notes (Signed)
PROGRESS NOTE   Subjective/Complaints:   No events overnight.  Patient endorses ongoing central and left-sided chest pain secondary to chest compressions, worsened by intermittent coughing, which is unremitting.  This prevents her from sleeping or being able to get comfortable.  She endorses a small amount of phlegm produced this morning, but overall cough is nonproductive.  She denies any fevers, chills, or worsening shortness of breath.  Notable patient has as needed guaifenesin available but has not utilized this for cough prevention.  She has lidocaine patches on board, but states she needs these for her back, cannot put them on her chest.  Discussed the risks versus benefits of cough suppression, but given her ongoing pain and discomfort and after reviewing chest x-rays, feel she would benefit from treatment.  Patient agreeable to scheduled Mucinex DM.  ROS: Chest pain, shortness of breath, cough, per HPI above.   Objective:   No results found. Recent Labs    02/19/23 1103  WBC 6.9  HGB 9.4*  HCT 29.2*  PLT 543*   Recent Labs    02/18/23 0547 02/19/23 1059  NA 134* 134*  K 4.5 4.2  CL 95* 94*  CO2 30 28  GLUCOSE 112* 127*  BUN 17 18  CREATININE 0.88 0.93  CALCIUM 9.3 9.2    Intake/Output Summary (Last 24 hours) at 02/20/2023 1441 Last data filed at 02/19/2023 1818 Gross per 24 hour  Intake 360 ml  Output --  Net 360 ml        Physical Exam: Vital Signs Blood pressure (!) 117/54, pulse 73, temperature 98.3 F (36.8 C), resp. rate 16, height 5\' 8"  (1.727 m), weight 101.1 kg, SpO2 95%. Gen: no distress, normal appearing.  Laying in bed. HEENT: oral mucosa pink and moist, NCAT Cardio: Reg rate and rhythm, positive murmur. Resp: No respiratory distress. No accessory muscle usage.  Right lower > left lower lobe mild rhonci and on 2 L [place] -unchanged. Abdomen: None distended. Nontender.  Normoactive bowel  sounds.   Psych: Appropriate mood and affect. Neuro: AAOx4. No apparent cognitive deficits  Skin: Clean, dry, intact.   Neurologic Exam:   No apparent deficits.  Moving all 4 extremities antigravity and against resistance.   MSK: Tender to palpation over sternum and left chest, no bruising or apparent lesions -unchanged  Assessment/Plan: 1. Functional deficits which require 3+ hours per day of interdisciplinary therapy in a comprehensive inpatient rehab setting. Physiatrist is providing close team supervision and 24 hour management of active medical problems listed below. Physiatrist and rehab team continue to assess barriers to discharge/monitor patient progress toward functional and medical goals  Care Tool:  Bathing    Body parts bathed by patient: Right arm, Left arm, Chest, Abdomen, Front perineal area, Buttocks, Right upper leg, Left upper leg, Face, Right lower leg, Left lower leg   Body parts bathed by helper: Right lower leg, Left lower leg     Bathing assist Assist Level: Supervision/Verbal cueing     Upper Body Dressing/Undressing Upper body dressing   What is the patient wearing?: Pull over shirt, Bra    Upper body assist Assist Level: Minimal Assistance - Patient > 75%  Lower Body Dressing/Undressing Lower body dressing      What is the patient wearing?: Underwear/pull up, Pants     Lower body assist Assist for lower body dressing: Minimal Assistance - Patient > 75%     Toileting Toileting    Toileting assist Assist for toileting: Contact Guard/Touching assist     Transfers Chair/bed transfer  Transfers assist     Chair/bed transfer assist level: Contact Guard/Touching assist     Locomotion Ambulation   Ambulation assist      Assist level: Contact Guard/Touching assist Assistive device: Rollator Max distance: 150   Walk 10 feet activity   Assist     Assist level: Contact Guard/Touching assist Assistive device: Rollator    Walk 50 feet activity   Assist Walk 50 feet with 2 turns activity did not occur: Safety/medical concerns (fatigue, global weakness/deconditioning)  Assist level: Contact Guard/Touching assist Assistive device: Rollator    Walk 150 feet activity   Assist Walk 150 feet activity did not occur: Safety/medical concerns (fatigue, global weakness/deconditioning)  Assist level: Contact Guard/Touching assist Assistive device: Rollator    Walk 10 feet on uneven surface  activity   Assist Walk 10 feet on uneven surfaces activity did not occur: Safety/medical concerns (fatigue, global weakness/deconditioning)         Wheelchair     Assist Is the patient using a wheelchair?: Yes Type of Wheelchair: Manual Wheelchair activity did not occur: Safety/medical concerns (fatigue)         Wheelchair 50 feet with 2 turns activity    Assist    Wheelchair 50 feet with 2 turns activity did not occur: Safety/medical concerns (fatigue)       Wheelchair 150 feet activity     Assist  Wheelchair 150 feet activity did not occur: Safety/medical concerns (fatigue)       Blood pressure (!) 117/54, pulse 73, temperature 98.3 F (36.8 C), resp. rate 16, height 5\' 8"  (1.727 m), weight 101.1 kg, SpO2 95%.  Medical Problem List and Plan: 1. 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  Antithrombotics: -DVT/anticoagulation:  Pharmaceutical: Eliquis 5 mg twice daily             -antiplatelet therapy: Aspirin and Plavix    3. Pain Management: Tylenol, oxycodone 5 mg as needed             -continue Robaxin 500 mg BID             -continue gabapentin 300 mg TID             -continue Lidoderm patches  -8-31: Primary limiting factor for pain is persistent  cough, scheduled Mucinex DM twice daily.  Can consider Tessalon Perles if ineffective.   4. Mood/Behavior/Sleep: LCSW to evaluate and provide emotional support. History of anxiety/depression             -continue Buspar 10 mg BID and  -continue Xanax 3 mg BID and once daily  prn             -refusing Prozac 20 mg q HS>>will discontinue             -  continue melatonin 3 mg q HS             -antipsychotic agents: n/a   5. Neuropsych/cognition: This patient is capable of making decisions on her own behalf.   6. Skin/Wound Care: Routine skin care checks   7. Fluids/Electrolytes/Nutrition: Routine Is and Os and follow-up chemistries             -hyponatremia>>improving, follow-up BMP   8: Hypertension: monitor TID and prn             -see meds #12   9: Hyperlipidemia: continue statin   10: CAD s/p PCI to RCA 2021; to LAD on 8/07: continue DAPT and statin   11: Aortic stenosis s/p TAVR 8/15: DAPT continues   12: Acute systolic CHF; EF ~86-57%; Lasix discontinued             -daily weight             -continue Farxiga 10 mg daily             -continue spironlactone 25 mg daily             -continue Entresto 49-51 BID (this dose started 8/26) Weight stable, monitor Filed Weights   02/18/23 1502 02/19/23 0702  Weight: 100.6 kg 101.1 kg      13: Urinary retention: most likely due to constipation and immobility             -Foley now out   14: Refractory VT resolved: continue amiodarone 200 mg BID   15: Carotid stenosis s/p right CEA 2020             -follow-up with [facility] as per protocol   16: Anemia; acute blood loss/other: follow-up CBC   17: Acute DVT left internal jugular>>resolved on recent venous duplex, superficial cephalic vein thrombosis present             -plan is to continue triple therapy and discontinue aspirin 30 days post-PCI              -continue Eliquis 5 mg BID   18: Hx partial vulvectomies with difficult Foley cath placement:  -urology consulted>>Foley  placed; removed 8/27   19: Sepsis due to pneumonia>>Enterobacter isolated, antibiotics completed             -Incentive spirometry every 2 hours while awake             -As needed oxygen, wean as tolerated  20: Anemia, multifactorial: Hgb reviewed and is stable, repeat Monday   21: Thrombocytosis: trending upward; follow-up CBC  22. Left pleural effusion: lasix 20mg  daily ordered.   -Repeat chest x-ray in a.m. 9-1  23. Shortness of breath: increased oxygen to 2L; has been stable 92 to 96%  24. History of prediabetes: currently well controlled, d/c CBG checks.   25. Screening for vitamin D deficiency: add on vitamin D level   LOS: 2 days A FACE TO FACE EVALUATION WAS PERFORMED  Angelina Sheriff 02/20/2023, 2:41 PM

## 2023-02-20 NOTE — Progress Notes (Signed)
Occupational Therapy Session Note  Patient Details  Name: Madison West MRN: [id number] Date of Birth: 1956/04/24  Today's Date: 02/20/2023 OT Individual Time: 4098-1191 OT Individual Time Calculation (min): 70 min    Short Term Goals: Week 1:  OT Short Term Goal 1 (Week 1): STG = LTG 2/2 ELOS  Skilled Therapeutic Interventions/Progress Updates:  Skilled OT intervention completed with focus on ADL retraining and cardiovascular endurance within a shower context. Pt received upright in bed, agreeable to session. Generalized soreness reported; pre-medicated. OT offered rest breaks, repositioning, and warm shower for muscle relaxation and pain relief.  Pt is verbose during entire session with mod directive cues to attend to task. Able to pick out her own clothes with set up A. Transitioned to EOB with supervision, sit > stand and ambulatory transfers with CGA using RW during session. Ambulated with directional/body positioning cues to toilet, especially to walk within the walker frame. Continent of urinary void, set up A for peri hygiene. Stood with supervision using grab bar, and CGA to shower with cues for side stepping with grab bar. Doffed all clothing with supervision for redonning [place] on after UB. Pt with matted hair- brushed hair prior to shower with set up A.  Pt was able to shower at the sit > stand level with supervision, using LH sponge. Cues needed for hand placement on grab bar and to sit for LB care. CGA ambulatory transfer > EOB with RW. Mod A needed to donn bra; discussed front clasp bra for efficiency. Shirt with set up A, LB clothing with min A for time constraint over feet, with CGA sit > stand however LOB posteriorly with pt flopping on bed. CGA sit > stand and stand pivot to recliner.  Pt remained seated in recliner, with BLE elevated, belt alarm on/activated, and with all needs in reach at end of session.  Vitals -Received on 2L via [place], SPO2 88% -90-97% with ADL activity  on 2L  Therapy Documentation Precautions:  Precautions Precautions: Fall Precaution Comments: monitor O2 sat Restrictions Weight Bearing Restrictions: No RLE Weight Bearing: Weight bearing as tolerated    Therapy/Group: Individual Therapy  Melvyn Novas, MS, OTR/L  02/20/2023, 11:50 AM

## 2023-02-20 NOTE — Plan of Care (Signed)
  Problem: Consults Goal: RH GENERAL PATIENT EDUCATION Description: See Patient Education module for education specifics. Outcome: Progressing   Problem: RH BOWEL ELIMINATION Goal: RH STG MANAGE BOWEL WITH ASSISTANCE Description: STG Manage Bowel with mod I  Assistance. Outcome: Progressing Goal: RH STG MANAGE BOWEL W/MEDICATION W/ASSISTANCE Description: STG Manage Bowel with Medication with mod I Assistance. Outcome: Progressing   Problem: RH BLADDER ELIMINATION Goal: RH STG MANAGE BLADDER WITH ASSISTANCE Description: STG Manage Bladder With Mod I Assistance Outcome: Progressing Goal: RH STG MANAGE BLADDER WITH MEDICATION WITH ASSISTANCE Description: STG Manage Bladder With Medication With mod I Assistance. Outcome: Progressing   Problem: RH SAFETY Goal: RH STG ADHERE TO SAFETY PRECAUTIONS W/ASSISTANCE/DEVICE Description: STG Adhere to Safety Precautions With cues Assistance/Device. Outcome: Progressing   Problem: RH KNOWLEDGE DEFICIT GENERAL Goal: RH STG INCREASE KNOWLEDGE OF SELF CARE AFTER HOSPITALIZATION Description: Manage self care with cues using educational resources independently Outcome: Progressing   Problem: RH KNOWLEDGE DEFICIT Goal: RH STG INCREASE KNOWLEDGE OF HYPERTENSION Description: Patient and spouse will be able to manage HTN with medications and dietary modifications using educational resources independently Outcome: Progressing Goal: RH STG INCREASE KNOWLEGDE OF HYPERLIPIDEMIA Description: Patient and spouse will be able to manage HLD with medications and dietary modifications using educational resources independently Outcome: Progressing   Problem: Cardiac: Goal: Ability to achieve and maintain adequate cardiopulmonary perfusion will improve Outcome: Progressing Goal: Vascular access site(s) Level 0-1 will be maintained Outcome: Progressing   Problem: Fluid Volume: Goal: Ability to achieve a balanced intake and output will improve Outcome:  Progressing   Problem: Physical Regulation: Goal: Complications related to the disease process, condition or treatment will be avoided or minimized Outcome: Progressing   Problem: Respiratory: Goal: Will regain and/or maintain adequate ventilation Outcome: Progressing   Problem: Education: Goal: Understanding of CV disease, CV risk reduction, and recovery process will improve Outcome: Progressing Goal: Individualized Educational Video(s) Outcome: Progressing   Problem: Activity: Goal: Ability to return to baseline activity level will improve Outcome: Progressing   Problem: Cardiovascular: Goal: Ability to achieve and maintain adequate cardiovascular perfusion will improve Outcome: Progressing Goal: Vascular access site(s) Level 0-1 will be maintained Outcome: Progressing   Problem: Health Behavior/Discharge Planning: Goal: Ability to safely manage health-related needs after discharge will improve Outcome: Progressing

## 2023-02-21 ENCOUNTER — Inpatient Hospital Stay (HOSPITAL_COMMUNITY): Payer: Medicare Other

## 2023-02-21 NOTE — IPOC Note (Signed)
Overall Plan of Care [facility]) Patient Details Name: Madison West MRN: [id number] DOB: 03-Oct-1955  Admitting Diagnosis: Debility  Hospital Problems: Principal Problem:   Debility     Functional Problem List: Nursing Bladder, Bowel, Endurance, Medication Management, Safety  PT Balance, Edema, Endurance, Pain, Skin Integrity  OT Balance, Cognition, Edema, Endurance, Motor, Pain  SLP Cognition  TR         Basic ADL's: OT Bathing, Dressing, Toileting     Advanced  ADL's: OT Simple Meal Preparation     Transfers: PT Bed Mobility, Bed to Chair, Car, Occupational psychologist, Research scientist (life sciences): PT Ambulation, Psychologist, prison and probation services, Stairs     Additional Impairments: OT None  SLP Social Cognition   Problem Solving, Memory, Attention  TR      Anticipated Outcomes Item Anticipated Outcome  Self Feeding Indep  Swallowing      Basic self-care  Mod I/supervision  Toileting  Mod I   Bathroom Transfers Mod I  Bowel/Bladder  manage bowel and bladder w mod I assist  Transfers  Mod I with LRAD  Locomotion  Mod I with LRAD  Communication     Cognition  Mod I- Independent  Pain  n/a  Safety/Judgment  manage w cues   Therapy Plan: PT Intensity: Minimum of 1-2 x/day ,45 to 90 minutes PT Frequency: 5 out of 7 days PT Duration Estimated Length of Stay: 7-10 days OT Intensity: Minimum of 1-2 x/day, 45 to 90 minutes OT Frequency: 5 out of 7 days OT Duration/Estimated Length of Stay: 7-10 days SLP Intensity: Minumum of 1-2 x/day, 30 to 90 minutes SLP Frequency: 3 to 5 out of 7 days SLP Duration/Estimated Length of Stay: 14-16 days   Team Interventions: Nursing Interventions Patient/Family Education, Bowel Management, Bladder Management, Disease Management/Prevention, Medication Management, Discharge Planning  PT interventions Ambulation/gait training, Discharge planning, Therapeutic Activities, Balance/vestibular training, Disease management/prevention,  Neuromuscular re-education, Skin care/wound management, Therapeutic Exercise, Wheelchair propulsion/positioning, DME/adaptive equipment instruction, Cognitive remediation/compensation, Pain management, Splinting/orthotics, UE/LE Strength taining/ROM, Community reintegration, Equities trader education, Museum/gallery curator, UE/LE Coordination activities  OT Interventions Warden/ranger, Discharge planning, Pain management, Self Care/advanced ADL retraining, UE/LE Coordination activities, Therapeutic Activities, Disease mangement/prevention, Functional mobility training, Patient/family education, Therapeutic Exercise, DME/adaptive equipment instruction, Neuromuscular re-education, Psychosocial support, UE/LE Strength taining/ROM  SLP Interventions Cognitive remediation/compensation, Cueing hierarchy, Medication managment, Internal/external aids, Therapeutic Activities, Functional tasks  TR Interventions    SW/CM Interventions Discharge Planning, Psychosocial Support, Patient/Family Education, Disease Management/Prevention   Barriers to Discharge MD  Medical stability  Nursing Decreased caregiver support, Home environment access/layout 1 level 2 ste bil rail w spouse  PT Decreased caregiver support, Home environment access/layout, Other (comments) pain, daughters will only be able to provide intermittent assist  OT Incontinence, New oxygen, Home environment access/layout, Decreased caregiver support    SLP      SW Decreased caregiver support, Lack of/limited family support     Team Discharge Planning: Destination: PT-Home ,OT- Home , SLP-Home Projected Follow-up: PT-Outpatient PT, OT-  Home health OT, SLP-None Projected Equipment Needs: PT-To be determined, OT- To be determined, SLP-To be determined Equipment Details: PT-has none, OT-  Patient/family involved in discharge planning: PT- Patient,  OT-Patient, SLP-Patient  MD ELOS: 8-11 days Medical Rehab Prognosis:  Excellent Assessment:  The patient has been admitted for CIR therapies with the diagnosis of debility. The team will be addressing functional mobility, strength, stamina, balance, safety, adaptive techniques and equipment, self-care, bowel and bladder mgt, patient and caregiver education, cognition,  neuromuscular re-ed, community reentry. Goals have been set at mod I for mobility, self-care and cognition. Anticipated discharge destination is home.        See Team Conference Notes for weekly updates to the plan of care

## 2023-02-21 NOTE — Final Progress Note (Signed)
Alert, verbalize soreness to sternum and chest area s/p CPR episode otherwise she states she doing better. Continue to enforce 1200 cc fluid restriction but states she feel she is doing better with monitoring because she wants to go home.

## 2023-02-21 NOTE — Progress Notes (Signed)
PROGRESS NOTE   Subjective/Complaints:   No events overnight.  Vital stable. Bowel movement this a.m.  Continues to have substernal and left-sided chest pain with cough and with movements, however she states scheduled Mucinex has significantly helped with the frequency of her coughing and allowed her to get good rest overnight.  She does endorse some mild fatigue with the medication, but wishes to continue with it.  She is very happy as today is the first day she was able to put on make-up since her hospitalization.  Reviewed a.m. chest x-ray with patient, read not available but looks approximately unchanged from prior.  ROS: Chest pain, shortness of breath, cough, per HPI above.   Objective:   No results found. Recent Labs    02/19/23 1103  WBC 6.9  HGB 9.4*  HCT 29.2*  PLT 543*   Recent Labs    02/19/23 1059  NA 134*  K 4.2  CL 94*  CO2 28  GLUCOSE 127*  BUN 18  CREATININE 0.93  CALCIUM 9.2   No intake or output data in the 24 hours ending 02/21/23 0857       Physical Exam: Vital Signs Blood pressure 117/63, pulse 74, temperature 97.8 F (36.6 C), temperature source Oral, resp. rate 18, height 5\' 8"  (1.727 m), weight 101.1 kg, SpO2 94%. Gen: no distress, normal appearing.  Sitting up at sink, brushing teeth. HEENT: oral mucosa pink and moist, NCAT Cardio: Reg rate and rhythm, positive murmur. Resp: No respiratory distress. No accessory muscle usage.  Right lower > left lower lobe mild rhonci and on 2 L [place] -unchanged.  On 2 L nasal cannula. Abdomen: None distended. Nontender.  Normoactive bowel sounds.   Psych: Appropriate mood and affect.  Pleasant, happy. Neuro: AAOx4. No apparent cognitive deficits  Skin: Clean, dry, intact.   Neurologic Exam:   No apparent deficits.  Moving all 4 extremities antigravity and against resistance.   MSK: Tender to palpation over sternum and left chest, no bruising  or apparent lesions -unchanged  Assessment/Plan: 1. Functional deficits which require 3+ hours per day of interdisciplinary therapy in a comprehensive inpatient rehab setting. Physiatrist is providing close team supervision and 24 hour management of active medical problems listed below. Physiatrist and rehab team continue to assess barriers to discharge/monitor patient progress toward functional and medical goals  Care Tool:  Bathing    Body parts bathed by patient: Right arm, Left arm, Chest, Abdomen, Front perineal area, Buttocks, Right upper leg, Left upper leg, Face, Right lower leg, Left lower leg   Body parts bathed by helper: Right lower leg, Left lower leg     Bathing assist Assist Level: Supervision/Verbal cueing     Upper Body Dressing/Undressing Upper body dressing   What is the patient wearing?: Pull over shirt, Bra    Upper body assist Assist Level: Minimal Assistance - Patient > 75%    Lower Body Dressing/Undressing Lower body dressing      What is the patient wearing?: Underwear/pull up, Pants     Lower body assist Assist for lower body dressing: Minimal Assistance - Patient > 75%     Toileting Toileting    Toileting assist  Assist for toileting: Contact Guard/Touching assist     Transfers Chair/bed transfer  Transfers assist     Chair/bed transfer assist level: Contact Guard/Touching assist     Locomotion Ambulation   Ambulation assist      Assist level: Contact Guard/Touching assist Assistive device: Rollator Max distance: 150   Walk 10 feet activity   Assist     Assist level: Contact Guard/Touching assist Assistive device: Rollator   Walk 50 feet activity   Assist Walk 50 feet with 2 turns activity did not occur: Safety/medical concerns (fatigue, global weakness/deconditioning)  Assist level: Contact Guard/Touching assist Assistive device: Rollator    Walk 150 feet activity   Assist Walk 150 feet activity did not  occur: Safety/medical concerns (fatigue, global weakness/deconditioning)  Assist level: Contact Guard/Touching assist Assistive device: Rollator    Walk 10 feet on uneven surface  activity   Assist Walk 10 feet on uneven surfaces activity did not occur: Safety/medical concerns (fatigue, global weakness/deconditioning)         Wheelchair     Assist Is the patient using a wheelchair?: Yes Type of Wheelchair: Manual Wheelchair activity did not occur: Safety/medical concerns (fatigue)         Wheelchair 50 feet with 2 turns activity    Assist    Wheelchair 50 feet with 2 turns activity did not occur: Safety/medical concerns (fatigue)       Wheelchair 150 feet activity     Assist  Wheelchair 150 feet activity did not occur: Safety/medical concerns (fatigue)       Blood pressure 117/63, pulse 74, temperature 97.8 F (36.6 C), temperature source Oral, resp. rate 18, height 5\' 8"  (1.727 m), weight 101.1 kg, SpO2 94%.  Medical Problem List and Plan: 1. 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  Antithrombotics: -DVT/anticoagulation:  Pharmaceutical: Eliquis 5 mg twice daily             -antiplatelet therapy: Aspirin and Plavix    3. Pain Management: Tylenol, oxycodone 5 mg as needed             -continue Robaxin 500 mg BID             -continue gabapentin 300 mg TID             -continue Lidoderm patches  -8-31: Primary limiting factor for pain is persistent cough, scheduled Mucinex DM twice daily.  Can consider Tessalon Perles if ineffective.  9-1: Pain remains with movement, coughing, however significant relief with decreased frequency of coughing.  Continue current regimen.  Encouraged incentive spirometer.   4.  Mood/Behavior/Sleep: LCSW to evaluate and provide emotional support. History of anxiety/depression             -continue Buspar 10 mg BID and  -continue Xanax 3 mg BID and once daily  prn             -refusing Prozac 20 mg q HS>>will discontinue             -continue melatonin 3 mg q HS             -  antipsychotic agents: n/a   5. Neuropsych/cognition: This patient is capable of making decisions on her own behalf.   6. Skin/Wound Care: Routine skin care checks   7. Fluids/Electrolytes/Nutrition: Routine Is and Os and follow-up chemistries             -hyponatremia>>improving, follow-up BMP   8: Hypertension: monitor TID and prn             -see meds #12   9: Hyperlipidemia: continue statin   10: CAD s/p PCI to RCA 2021; to LAD on 8/07: continue DAPT and statin   11: Aortic stenosis s/p TAVR 8/15: DAPT continues   12: Acute systolic CHF; EF ~13-08%; Lasix discontinued             -daily weight             -continue Farxiga 10 mg daily             -continue spironlactone 25 mg daily             -continue Entresto 49-51 BID (this dose started 8/26)  - 8/31, 9/1 Weight stable, monitor Filed Weights   02/19/23 0702 02/20/23 0712 02/21/23 0459  Weight: 101.1 kg 101.9 kg 101.1 kg      13: Urinary retention: most likely due to constipation and immobility             -Foley now out   14: Refractory VT resolved: continue amiodarone 200 mg BID   15: Carotid stenosis s/p right CEA 2020             -follow-up with [facility] as per protocol   16: Anemia; acute blood loss/other: follow-up CBC   17: Acute DVT left internal jugular>>resolved on recent venous duplex, superficial cephalic vein thrombosis present             -plan is to continue triple therapy and discontinue aspirin 30 days post-PCI              -continue Eliquis 5 mg BID   18: Hx partial vulvectomies with difficult Foley cath placement:  -urology consulted>>Foley placed; removed 8/27   19: Sepsis due to  pneumonia>>Enterobacter isolated, antibiotics completed             -Incentive spirometry every 2 hours while awake             -As needed oxygen, wean as tolerated  20: Anemia, multifactorial: Hgb reviewed and is stable, repeat Monday   21: Thrombocytosis: trending upward; follow-up CBC  22. Left pleural effusion: lasix 20mg  daily ordered.   -Repeat chest x-ray in a.m. 9-1 - stable effusions  23. Shortness of breath: increased oxygen to 2L; has been stable 92 to 96%   - Unable to wean lower than 2 L, encouraging incentive spirometer  24. History of prediabetes: currently well controlled, d/c CBG checks.   25. Screening for vitamin D deficiency: add on vitamin D level   LOS: 3 days A FACE TO FACE EVALUATION WAS PERFORMED  Angelina Sheriff 02/21/2023, 8:57 AM

## 2023-02-22 LAB — BASIC METABOLIC PANEL
Anion gap: 8 (ref 5–15)
BUN: 21 mg/dL (ref 8–23)
CO2: 30 mmol/L (ref 22–32)
Calcium: 9.1 mg/dL (ref 8.9–10.3)
Chloride: 96 mmol/L — ABNORMAL LOW (ref 98–111)
Creatinine, Ser: 1.07 mg/dL — ABNORMAL HIGH (ref 0.44–1.00)
GFR, Estimated: 56 mL/min — ABNORMAL LOW (ref >60)
Glucose, Bld: 106 mg/dL — ABNORMAL HIGH (ref 70–99)
Potassium: 4.4 mmol/L (ref 3.5–5.1)
Sodium: 134 mmol/L — ABNORMAL LOW (ref 135–145)

## 2023-02-22 LAB — CBC
HCT: 26.7 % — ABNORMAL LOW (ref 36.0–46.0)
Hemoglobin: 8.6 g/dL — ABNORMAL LOW (ref 12.0–15.0)
MCH: 31.2 pg (ref 26.0–34.0)
MCHC: 32.2 g/dL (ref 30.0–36.0)
MCV: 96.7 fL (ref 80.0–100.0)
Platelets: 449 10*3/uL — ABNORMAL HIGH (ref 150–400)
RBC: 2.76 MIL/uL — ABNORMAL LOW (ref 3.87–5.11)
RDW: 14.8 % (ref 11.5–15.5)
WBC: 4.8 10*3/uL (ref 4.0–10.5)
nRBC: 0 % (ref 0.0–0.2)

## 2023-02-22 MED ORDER — FUROSEMIDE 20 MG PO TABS
10.0000 mg | ORAL_TABLET | Freq: Every day | ORAL | Status: DC
  Administered 2023-02-23: 10 mg via ORAL
  Filled 2023-02-22: qty 1

## 2023-02-22 NOTE — Progress Notes (Signed)
Speech Language Pathology Daily Session Note  Patient Details  Name: Madison West MRN: [id number] Date of Birth: 01/20/1956  Today's Date: 02/22/2023 SLP Individual Time: 1420-1505 SLP Individual Time Calculation (min): 45 min  Short Term Goals: Week 1: SLP Short Term Goal 1 (Week 1): Pt will complete moderately complex problem solving tasks with >80% accuracy given min cues. SLP Short Term Goal 2 (Week 1): Pt will utilize compensatory memory strategies and aids to recall day to day information and complete memory exercises with >80% accuracy. SLP Short Term Goal 3 (Week 1): Pt will complete medication management task with >90% accuracy given min cues. SLP Short Term Goal 4 (Week 1): Pt will complete financial management tasks with >90% accuracy given min cues.  Skilled Therapeutic Interventions: Skilled treatment session focused on cognitive goals. Upon arrival, patient requested to use the bathroom. Patient ambulated to the commode and was continent of bladder with overall supervision level verbal cues needed for patient to lock the brakes on the rollator prior to sitting. Patient participated in a memory task which focused on recall and carryover of 3 beverage orders (patient is a [employer] manager) in a moderately distracting environment. Patient required Min-Mod verbal cues to recall orders after a 5, 10, and 15 minute delay but was overall Mod I for problem solving regarding totaling the bill and providing appropriate change. SLP provided education regarding memory compensatory strategies but patient also provided strategies that she utilized prior to hospitalization to assist with recall. Patient left upright in recliner with alarm on and all needs within reach. Continue with current plan of care.      Pain No/Denies Pain   Therapy/Group: Individual Therapy  Emony Dormer 02/22/2023, 3:21 PM

## 2023-02-22 NOTE — Progress Notes (Signed)
Physical Therapy Session Note  Patient Details  Name: Madison West MRN: [id number] Date of Birth: 11-06-1955  Today's Date: 02/22/2023 PT Individual Time: 4782-9562 PT Individual Time Calculation (min): 72 min   Short Term Goals: Week 1:  PT Short Term Goal 1 (Week 1): STG=LTG due to LOS  Skilled Therapeutic Interventions/Progress Updates:   Received pt sitting in recliner, pt agreeable to PT treatment, and reported mild pain along chest from CPR compressions (premedicated). Pt on 1L O2 with SPO2 93% at rest. Session with emphasis on functional mobility/transfers, generalized strengthening and endurance, dynamic standing balance/coordination, and gait training. Pt performed all transfers with rollator and CGA/close supervision throughout session. Pt ambulated in/out of bathroom with rollator and close supervision with assist to manage O2 tank. Pt able to manage clothing without assist, void, and perform hygiene management independently. Pt stood at sink and washed hands, then ambulated 376ft x 2 trials with rollator and CGA/close supervision to/from dayroom. SPO2 dropped to 86% after ambulating to dayroom but able to recover to >95% with seated rest and pursed lip breathing. Pt stood without AD and CGA and ambulated ~73ft x 2 trials through obstacle course weaving in/out of cones with CGA/light min A for balance - SPO2 dropped to 86% but able to recover >95%. Pt then performed standing alternating toe taps to 3in step 2x20 without UE support and light min A for balance. Worked on blocked practice sit<>stands without UE support and CGA 2x10 with emphasis on quad strength. Pt required frequent rest breaks throughout session for O2 to recover and frequent redirection as pt is hyperverbal. Returned to room and concluded session with pt sitting in recliner, needs within reach, and chair pad alarm on. Pt left on 1L with SPO2 97%.   Therapy Documentation Precautions:  Precautions Precautions:  Fall Precaution Comments: monitor O2 sat Restrictions Weight Bearing Restrictions: No RLE Weight Bearing: Weight bearing as tolerated  Therapy/Group: Individual Therapy Marlana Salvage Zaunegger Blima Rich PT, DPT 02/22/2023, 6:53 AM

## 2023-02-22 NOTE — Progress Notes (Signed)
PROGRESS NOTE   Subjective/Complaints: Had some anxiety this morning but this improved with her medication Her shortness of breath improved, discussed that her kidney function worsened mildly.   ROS: +shortness of breath, +anxiety   Objective:   DG CHEST PORT 1 VIEW  Result Date: 02/21/2023 CLINICAL DATA:  Pleural effusions EXAM: PORTABLE CHEST 1 VIEW COMPARISON:  02/18/2023 FINDINGS: Removal of PICC line. Normal cardiac silhouette. Aortic valve noted. Interstitial edema pattern not changed. Mild basilar atelectasis. Small LEFT effusion. IMPRESSION: No significant oval change. Electronically Signed   By: Genevive Bi M.D.   On: 02/21/2023 14:34   Recent Labs    02/19/23 1103 02/22/23 0610  WBC 6.9 4.8  HGB 9.4* 8.6*  HCT 29.2* 26.7*  PLT 543* 449*   Recent Labs    02/19/23 1059 02/22/23 0610  NA 134* 134*  K 4.2 4.4  CL 94* 96*  CO2 28 30  GLUCOSE 127* 106*  BUN 18 21  CREATININE 0.93 1.07*  CALCIUM 9.2 9.1    Intake/Output Summary (Last 24 hours) at 02/22/2023 1016 Last data filed at 02/22/2023 0714 Gross per 24 hour  Intake 240 ml  Output --  Net 240 ml        Physical Exam: Vital Signs Blood pressure (!) 151/58, pulse 84, temperature 97.8 F (36.6 C), resp. rate 17, height 5\' 8"  (1.727 m), weight 101 kg, SpO2 92%. Gen: no distress, normal appearing, BMI 33.86 HEENT: oral mucosa pink and moist, NCAT Cardio: Reg rate Resp: No respiratory distress. No accessory muscle usage.  Right lower > left lower lobe mild rhonci and on 2 L [place] Cardio: Well perfused appearance. No peripheral edema. Abdomen: Mildly distended. Nontender.   Psych: Appropriate mood and affect. Neuro: AAOx4. No apparent cognitive deficits  Skin: Groin catheterization sites well-healed, without apparent hematoma.  Clean, dry, intact.   Neurologic Exam:   DTRs: Reflexes were 2+ in bilateral achilles, patella, biceps, BR and  triceps. Babinsky: flexor responses b/l.   Hoffmans: negative b/l Sensory exam: revealed normal sensation in all dermatomal regions in bilateral upper extremities and bilateral lower extremities Motor exam: strength 5-/5 throughout bilateral upper extremities and bilateral lower extremities Coordination: Fine motor coordination was normal.  Mild bilateral upper extremity intention tremor   MSK: Tender to palpation over sternum and left chest, no bruising or apparent lesions  Assessment/Plan: 1. Functional deficits which require 3+ hours per day of interdisciplinary therapy in a comprehensive inpatient rehab setting. Physiatrist is providing close team supervision and 24 hour management of active medical problems listed below. Physiatrist and rehab team continue to assess barriers to discharge/monitor patient progress toward functional and medical goals  Care Tool:  Bathing    Body parts bathed by patient: Right arm, Left arm, Chest, Abdomen, Front perineal area, Buttocks, Right upper leg, Left upper leg, Face, Right lower leg, Left lower leg   Body parts bathed by helper: Right lower leg, Left lower leg     Bathing assist Assist Level: Supervision/Verbal cueing     Upper Body Dressing/Undressing Upper body dressing   What is the patient wearing?: Pull over shirt, Bra    Upper body assist Assist Level: Minimal Assistance -  Patient > 75%    Lower Body Dressing/Undressing Lower body dressing      What is the patient wearing?: Underwear/pull up, Pants     Lower body assist Assist for lower body dressing: Minimal Assistance - Patient > 75%     Toileting Toileting    Toileting assist Assist for toileting: Contact Guard/Touching assist     Transfers Chair/bed transfer  Transfers assist     Chair/bed transfer assist level: Contact Guard/Touching assist     Locomotion Ambulation   Ambulation assist      Assist level: Contact Guard/Touching assist Assistive  device: Rollator Max distance: 150   Walk 10 feet activity   Assist     Assist level: Contact Guard/Touching assist Assistive device: Rollator   Walk 50 feet activity   Assist Walk 50 feet with 2 turns activity did not occur: Safety/medical concerns (fatigue, global weakness/deconditioning)  Assist level: Contact Guard/Touching assist Assistive device: Rollator    Walk 150 feet activity   Assist Walk 150 feet activity did not occur: Safety/medical concerns (fatigue, global weakness/deconditioning)  Assist level: Contact Guard/Touching assist Assistive device: Rollator    Walk 10 feet on uneven surface  activity   Assist Walk 10 feet on uneven surfaces activity did not occur: Safety/medical concerns (fatigue, global weakness/deconditioning)         Wheelchair     Assist Is the patient using a wheelchair?: Yes Type of Wheelchair: Manual Wheelchair activity did not occur: Safety/medical concerns (fatigue)         Wheelchair 50 feet with 2 turns activity    Assist    Wheelchair 50 feet with 2 turns activity did not occur: Safety/medical concerns (fatigue)       Wheelchair 150 feet activity     Assist  Wheelchair 150 feet activity did not occur: Safety/medical concerns (fatigue)       Blood pressure (!) 151/58, pulse 84, temperature 97.8 F (36.6 C), resp. rate 17, height 5\' 8"  (1.727 m), weight 101 kg, SpO2 92%.  Medical Problem List and Plan: 1. 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  Antithrombotics: -DVT/anticoagulation:  Pharmaceutical: Eliquis 5 mg twice daily             -antiplatelet therapy: Aspirin and Plavix    3. Pain Management: Tylenol, oxycodone 5 mg as needed             -continue Robaxin 500 mg BID              -continue gabapentin 300 mg TID             -continue Lidoderm patches 1-2 as needed for pain   4. Mood/Behavior/Sleep: LCSW to evaluate and provide emotional support. History of anxiety/depression             -continue Buspar 10 mg BID and  -continue Xanax 3 mg BID and once daily  prn             -refusing Prozac 20 mg q HS>>will discontinue             -continue melatonin 3 mg q HS             -  antipsychotic agents: n/a   5. Neuropsych/cognition: This patient is capable of making decisions on her own behalf.   6. Skin/Wound Care: Routine skin care checks   7. Fluids/Electrolytes/Nutrition: Routine Is and Os and follow-up chemistries             -hyponatremia>>improving, follow-up BMP   8: Hypertension: monitor TID and prn             -see meds #12   9: Hyperlipidemia: continue statin   10: CAD s/p PCI to RCA 2021; to LAD on 8/07: continue DAPT and statin   11: Aortic stenosis s/p TAVR 8/15: DAPT continues   12: Acute systolic CHF; EF ~16-10%; Lasix discontinued             -daily weight             -continue Farxiga 10 mg daily             -continue spironlactone 25 mg daily             -continue Entresto 49-51 BID (this dose started 8/26)   13: Urinary retention: most likely due to constipation and immobility             -Foley now out   14: Refractory VT resolved: continue amiodarone 200 mg BID   15: Carotid stenosis s/p right CEA 2020             -follow-up with [facility] as per protocol   16: Anemia; acute blood loss/other: follow-up CBC   17: Acute DVT left internal jugular>>resolved on recent venous duplex, superficial cephalic vein thrombosis present             -plan is to continue triple therapy and discontinue aspirin 30 days post-PCI              -continue Eliquis 5 mg BID   18: Hx partial vulvectomies with difficult Foley cath placement:  -urology consulted>>Foley placed; removed 8/27   19: Sepsis due to pneumonia>>Enterobacter isolated, antibiotics  completed             -Incentive spirometry every 2 hours while awake, placed nursing order to request that this be placed within reach of patient at all times             -As needed oxygen, wean as tolerated   20: Anemia, multifactorial: Hgb reviewed and is stable, repeat Monday   21: Thrombocytosis: trending upward; follow-up CBC  22. Left pleural effusion: stable on CXR, lasix decreased to 10mg  given milkd AKI  23. Shortness of breath: increased oxygen to 2L, improved, decreased lasix to 10mg   24. History of prediabetes: currently well controlled, d/c CBG checks, d/c Ensure between meals  25. Suboptimal vitamin D level: ergocalciferol 50,000U once per week ordered  25. Obesity: d/c Ensure between meals, provided dietary education regarding whole food protein sources  I spent >109mins performing patient care related activities, including face to face time, documentation time, med management, discussion of meds and lab orders with patient, and overall coordination of care.    LOS: 4 days A FACE TO FACE EVALUATION WAS PERFORMED  Clint Bolder P Arilyn Brierley 02/22/2023, 10:16 AM

## 2023-02-22 NOTE — Discharge Summary (Signed)
Physician Discharge Summary  Patient ID: Callianne Deniston MRN: [id number] DOB/AGE: 1956-05-07 67 y.o.  Admit date: 02/18/2023 Discharge date: 03/05/2023  Discharge Diagnoses:  Principal Problem:   Debility Active problems: Debility from acute on chronic heart failure History of anxiety History of depression Hyponatremia Hypertension Hyperlipidemia Coronary artery disease Aortic stenosis status post TAVR Urinary retention Carotid stenosis Anemia Acute DVT left internal jugular vein Superficial cephalic vein thrombosis Left pleural effusion Shortness of breath History of prediabetes Obesity Hypotension   Discharged Condition: stable  Significant Diagnostic Studies: DG CHEST PORT 1 VIEW  Result Date: 02/21/2023 CLINICAL DATA:  Pleural effusions EXAM: PORTABLE CHEST 1 VIEW COMPARISON:  02/18/2023 FINDINGS: Removal of PICC line. Normal cardiac silhouette. Aortic valve noted. Interstitial edema pattern not changed. Mild basilar atelectasis. Small LEFT effusion. IMPRESSION: No significant oval change. Electronically Signed   By: Genevive Bi M.D.   On: 02/21/2023 14:34   DG CHEST PORT 1 VIEW  Result Date: 02/18/2023 CLINICAL DATA:  Dyspnea. EXAM: PORTABLE CHEST 1 VIEW COMPARISON:  02/13/2023 FINDINGS: Left arm PICC line tip terminates in the distal SVC. Aortic atherosclerotic calcifications. Status post TAVR. Small left pleural effusion. Bilateral peripheral predominant opacities within the right upper lobe and lung bases are unchanged from previous exam. IMPRESSION: 1. Persistent bilateral peripheral predominant pulmonary opacities with small left pleural effusion. Electronically Signed   By: Signa Kell M.D.   On: 02/18/2023 13:45    Labs:  Basic Metabolic Panel: Recent Labs  Lab 03/01/23 0710  NA 135  K 4.2  CL 99  CO2 27  GLUCOSE 108*  BUN 14  CREATININE 0.94  CALCIUM 8.9    CBC: Recent Labs  Lab 03/01/23 0710  WBC 4.7  HGB 9.6*  HCT 30.4*  MCV  96.8  PLT 338    CBG: No results for input(s): "GLUCAP" in the last 168 hours.   Brief HPI:   Magdelene Brizendine is a 67 y.o. female who follows with Dr. Tacey Ruiz Cardiology with a history of AS, CAD s/p PCI in 2021 who presented to the [facility] ED on 01/23/2023 complaining ongoing shortness of breath. Her BNP was elevated along with mild elevation in her troponins. No hypoxemia but some increased work of breathing with exertion. Transferred to [facility] telemetry unit and admitted to hospitalist service. CT angiogram of chest showed no evidence of pulmonary embolism but mild bronchial wall thickening. EKG showed PVC, LAE, LAFB minimal ST elevation. Continued supportive care with IV Lasix, intake and output charting daily weights. Echo updated>>now with depressed LV function and mean gradient of aortic valve of and evidence for low flow low gradient AS with calcified valve visually. Cardiology consulted on 8/07 and recommended heart cath. Given the cardiomyopathy and HFrEF, discussed the importance of guideline directed medical therapy. She appeared to be apprehensive but willing to uptitrate medical therapy given the clinical findings. Discontinued amlodipine 2.5 mg p.o. daily. Continued losartan. Started spironolactone 25 mg p.o. daily. Cardiothoracic surgery consulted on 8/08: Per Dr. Leafy Ro, NYHA class 2-3 symptoms of ischemic cardiomyopathy complicated with low flow low gradient AS. Depressed LV function with EF 30-35% and with comorbidities that would make SAVR/CABG higher risk. He felt that TAVR work up to determine if TAVR anatomically feasable would be best management for her AS and to PCI LAD lesion. Patient underwent CT angiogram of the chest, CT coronaries and CT angio of the abdomen and pelvis prior to deciding on further course of treatment. HFrEF likely a combination of valvular and  ischemic cardiomyopathy. She experienced episodes of sinus tachycardia at rest and exertion, and  brief episodes of NSVT. On 8/11, she developed ventricular tachycardia with no pulse. CPR intiated; ACLS protocol with epi and defibrillated times two with ROSC. V-tach once again, shock delivered and given magnesium and calcium. Patient started on amio drip. Patient transferred to [facility] ICU and cardiology consulted. EKG showing ST elevation in anterior leads. Code STEMI. PCCM consulted. Acute stent thrombosis. [place] balloon up to 20 atm with restoration of TIMI-3 flow. Dr. Rosemary Holms did not think additional stent was necessary in absence of obvious stent edge dissection, and increased risk of stent thrombosis with overlapping stents. ST elevation resolved on EKG during the procedure. EKG today showed limited septal infarct. On the evening of 8/12, she developed Ventricular bigeminy, trigeminy through the evening with episodes of NSVT for which amiodarone was started. At 10:52, she had VT arrest X2, with one she lost pulse, was successfully defibrillated. Had brief pause after defibrillation but returned to sinus rhythm with ROSC. She has no new chest pain at this time. 12 lead EKG does not show new ST elevation, show incomplete LBBB and age indeterminate septal infarct. However, given her stent thrombosis yesterday, low EF, severe AS, Dr. Rosemary Holms recommended re-look angiography, along with RHC to assess invasive hemodynamics. Remained intubated for airway protection. Discussed with Dr. Gala Romney and IABP placed. Dr. Lynnette Caffey consulted for placement on VA ECMO support and potential TAVR. She underwent procedure on 8/15. Extubated on 8/18. PICC placed 8/19. Completed antibiotics for pneumonia on 8/20. 8/21 urology consulted secondary to difficult Foley catheter placement. History noted of multiple partial vulvectomy in the past. Acute DVT in left internal jugular along with acute superficial vein thrombosis involving the left cephalic vein, proximal upper arm to mid forearm and started on apixaban. Heart failure team  adjusting medications, diuresing. Off all vasoactive medications and O2 requirements down to 4 L/min and transferred to PCU on 8/25. Noted hypotensive overnight 8/27 and required albumin. Heart failure team felt likely over diuresis. Holding Lasix and checked orthostatics. CBC and lactic acid obtained. LA + 0.6. Entresto reduced to 49-51. Continued spironolactone 25 mg daily. Foley removed 8/28. LUE venous duplex repeated on 8/28: Findings consistent with age indeterminate superficial vein thrombosis involving the left cephalic vein-essentially unchanged as compared to previous examination. Previously visualized internal jugular vein DVT appears resolved on this examination. Plan to stay off of Lasix and begin Fraxiga 10 mg daily. On apixaban for left internal jugular DVT. HF team plans to continue triple therapy for now given history of stent thrombosis. After 1 month post-PCI, plan to drop ASA. No beta blocker now for low output. Tolerating diet. Patient was able to perform transfers/ambulation around room with increased time, small steps, with CGA and rest breaks. She was able to complete LB dressing with CGA, increased time, pain in chest when reaching/bending down.    Hospital Course: Terena Blueford was admitted to rehab 02/18/2023 for inpatient therapies to consist of PT, ST and OT at least three hours five days a week. Past admission physiatrist, therapy team and rehab RN have worked together to provide customized collaborative inpatient rehab. Mild dyspnea on admission. CXR updated. givenLasix 20 mEq daily started on 8/30. Continued O2 supplementation at 2L via [place].  Mild persistent nonproductive cough treated with Mucinex DM.  Follow-up labs with stable hemoglobin, unchanged hyponatremia and slightly increased thrombocytosis.  Follow-up chest x-ray 9/1 is stable.  Vitamin D level suboptimal and started weekly supplements. Meds adjusted  for hypotension 9/06 (see below). Chest x-ray updated with mixed  pattern pulmonary edema and Lasix 20 mg daily restarted on 9/08.  Patient appealed discharge on 9/09. Mod I in room 9/10.    Blood pressures were monitored on TID basis and mains stable.  Treatment for heart failure including Farxiga, spironolactone and Entresto continued along with the addition of Lasix 20 mg daily. Lasix decreased to 10 mg due to mild increase in serum creatinine. Aspirin therapy completed. Discontinued Lasix due to hypotension. Discussed with heart failure team and Entresto dose decreased to 24-26 mg BID and aldactone decreased to 12.5 mg daily.   Rehab course: During patient's stay in rehab weekly team conferences were held to monitor patient's progress, set goals and discuss barriers to discharge. At admission, patient required min with mobility and with basic self care skills.  She  has had improvement in activity tolerance, balance, postural control as well as ability to compensate for deficits. She has had improvement in functional use RUE/LUE  and RLE/LLE as well as improvement in awareness. Ambulated > gym. O2 sats dropped to 82% on RA with exertion but able to recover to > 90% with pursed lip breathing/cessation of talking.   Pt completed the following intervals on nustep to promote global/cardiovascular endurance needed for independence with BADLs and functional mobility: -10 mins, level 3  Discharged home with [facility], OT   Discharge disposition: 06-Home-Health Care Svc     Diet: Heart healthy, carb modified  Special Instructions: No driving, alcohol consumption or tobacco use.  30-35 minutes were spent on discharge planning and discharge summary.  Discharge Instructions     Ambulatory referral to Physical Medicine Rehab   Complete by: As directed    Hospital follow-up   Discharge patient   Complete by: As directed    Discharge disposition: 06-Home-Health Care Svc   Discharge patient date: 03/05/2023      Allergies as of 03/05/2023        Reactions   Prednisone Palpitations   Fentanyl Swelling, Other (See Comments)   Makes pt "hyper" feels she is "climbing the walls."  Last time she had a colonoscopy with Fentanyl and Versed she was awake the whole time.   Versed [midazolam] Swelling, Anxiety   Makes pt "hyper" feels she is "climbing the walls."  Last time she had a colonoscopy with Fentanyl and Versed she was awake the whole time.   Vicodin [hydrocodone-acetaminophen] Anxiety   Extreme anxiety. OK to take oxycodone.        Medication List     STOP taking these medications    aspirin 81 MG chewable tablet   CO Q 10 PO   FISH OIL PO   FLUoxetine 20 MG capsule Commonly known as: PROZAC   GARLIC PO   TURMERIC CURCUMIN PO   VITAMIN C PO       TAKE these medications    acetaminophen 325 MG tablet Commonly known as: TYLENOL Take 2 tablets (650 mg total) by mouth every 6 (six) hours. What changed:  medication strength how much to take when to take this reasons to take this   alprazolam 2 MG tablet Commonly known as: XANAX Take 0.25-2 mg by mouth See admin instructions. 1 mg (may take up to 2 mg) twice daily. May also take 0.25 mg during the day if needed for anxiety.   amiodarone 200 MG tablet Commonly known as: PACERONE Take 1 tablet (200 mg total) by mouth 2 (two) times daily.  apixaban 5 MG Tabs tablet Commonly known as: ELIQUIS Take 1 tablet (5 mg total) by mouth 2 (two) times daily.   busPIRone 10 MG tablet Commonly known as: BUSPAR Take 1 tablet (10 mg total) by mouth 2 (two) times daily.   clopidogrel 75 MG tablet Commonly known as: PLAVIX Take 1 tablet (75 mg total) by mouth daily.   dapagliflozin propanediol 10 MG Tabs tablet Commonly known as: FARXIGA Take 1 tablet (10 mg total) by mouth daily. Start taking on: March 06, 2023   dextromethorphan-guaiFENesin 30-600 MG 12hr tablet Commonly known as: MUCINEX DM Take 1 tablet by mouth 2 (two) times daily.   docusate sodium  100 MG capsule Commonly known as: COLACE Take 1 capsule (100 mg total) by mouth 2 (two) times daily.   furosemide 20 MG tablet Commonly known as: LASIX Take 0.5 tablets (10 mg total) by mouth daily. Start taking on: March 06, 2023   gabapentin 300 MG capsule Commonly known as: NEURONTIN Take 1 capsule (300 mg total) by mouth 3 (three) times daily. What changed: when to take this   lidocaine 5 % Commonly known as: LIDODERM Place 2 patches onto the skin daily as needed. Remove & Discard patch within 12 hours or as directed by MD   melatonin 3 MG Tabs tablet Take 1 tablet (3 mg total) by mouth daily.   methocarbamol 500 MG tablet Commonly known as: ROBAXIN Take 1 tablet (500 mg total) by mouth 2 (two) times daily.   nitroGLYCERIN 0.4 MG SL tablet Commonly known as: NITROSTAT Place 1 tablet (0.4 mg total) under the tongue every 5 (five) minutes as needed for chest pain.   oxyCODONE 5 MG immediate release tablet Commonly known as: Oxy IR/ROXICODONE Take 1 tablet (5 mg total) by mouth every 4 (four) hours as needed for moderate pain or severe pain.   polyethylene glycol 17 g packet Commonly known as: MIRALAX / GLYCOLAX Take 17 g by mouth daily. Start taking on: March 06, 2023 What changed:  when to take this reasons to take this   rosuvastatin 20 MG tablet Commonly known as: CRESTOR Take 1 tablet (20 mg total) by mouth daily.   sacubitril-valsartan 49-51 MG Commonly known as: ENTRESTO Take 1 tablet by mouth 2 (two) times daily.   spironolactone 25 MG tablet Commonly known as: ALDACTONE Take 0.5 tablets (12.5 mg total) by mouth daily. Start taking on: March 06, 2023 What changed: how much to take   Vitamin D (Ergocalciferol) 1.25 MG (50000 UNIT) Caps capsule Commonly known as: DRISDOL Take 1 capsule (50,000 Units total) by mouth every 7 (seven) days. Take each Friday until finished.   VITAMIN D3 PO Take 1 capsule by mouth daily.   ZINC PO Take 1  tablet by mouth daily.        Follow-up Information     Raulkar, Drema Pry, MD Follow up.   Specialty: Physical Medicine and Rehabilitation Why: office will call you to arrange your appt (sent) Contact information: [address] [place] [address] [phone]         [name], MD Follow up.   Specialty: Obstetrics and Gynecology Why: The office in 1 to 2 days to make arrangements for hospital follow-up appointment. Contact information: [facility] [address] [place] [address] [phone]         Janann Colonel, MD. Go to.   Specialty: Pulmonary Disease Contact information: [address] [place] [address] [phone]  Patwardhan, Anabel Bene, MD Follow up.   Specialties: Cardiology, Radiology Contact information: [address] [place] [address] [phone]         Laurey Morale, MD. Go to.   Specialty: Cardiology Contact information: [address] [place] [place] [address] [phone]         Eugenio Hoes, MD Follow up.   Specialty: Cardiothoracic Surgery Why: The office in 1 to 2 days to make arrangements for hospital follow-up appointment. Contact information: [address] [place] [address] [phone]                 Signed: Milinda Antis 03/05/2023, 4:24 PM

## 2023-02-22 NOTE — Plan of Care (Signed)
  Problem: RH BOWEL ELIMINATION Goal: RH STG MANAGE BOWEL WITH ASSISTANCE Description: STG Manage Bowel with mod I  Assistance. Outcome: Progressing Goal: RH STG MANAGE BOWEL W/MEDICATION W/ASSISTANCE Description: STG Manage Bowel with Medication with mod I Assistance. Outcome: Progressing   Problem: RH BLADDER ELIMINATION Goal: RH STG MANAGE BLADDER WITH ASSISTANCE Description: STG Manage Bladder With Mod I Assistance Outcome: Progressing Goal: RH STG MANAGE BLADDER WITH MEDICATION WITH ASSISTANCE Description: STG Manage Bladder With Medication With mod I Assistance. Outcome: Progressing   Problem: RH SAFETY Goal: RH STG ADHERE TO SAFETY PRECAUTIONS W/ASSISTANCE/DEVICE Description: STG Adhere to Safety Precautions With cues Assistance/Device. Outcome: Progressing   Problem: RH KNOWLEDGE DEFICIT GENERAL Goal: RH STG INCREASE KNOWLEDGE OF SELF CARE AFTER HOSPITALIZATION Description: Manage self care with cues using educational resources independently Outcome: Progressing   Problem: RH KNOWLEDGE DEFICIT Goal: RH STG INCREASE KNOWLEDGE OF HYPERTENSION Description: Patient and spouse will be able to manage HTN with medications and dietary modifications using educational resources independently Outcome: Progressing Goal: RH STG INCREASE KNOWLEGDE OF HYPERLIPIDEMIA Description: Patient and spouse will be able to manage HLD with medications and dietary modifications using educational resources independently Outcome: Progressing   Problem: Fluid Volume: Goal: Ability to achieve a balanced intake and output will improve Outcome: Progressing

## 2023-02-22 NOTE — Progress Notes (Signed)
Occupational Therapy Session Note  Patient Details  Name: Madison West MRN: [id number] Date of Birth: 11-Jan-1956  Today's Date: 02/22/2023 OT Individual Time: 0850-1000 OT Individual Time Calculation (min): 70 min    Short Term Goals: Week 1:  OT Short Term Goal 1 (Week 1): STG = LTG 2/2 ELOS  Skilled Therapeutic Interventions/Progress Updates:  Skilled OT intervention completed with focus on ambulatory transfers, dynamic balance, endurance. Pt received upright in bed, agreeable to session. Soreness in chest reported; pre-medicated. OT offered rest breaks and repositioning throughout for pain reduction.  Pt expressed high levels of anxiety this AM, with time needed to console and redirect to therapy tasks. Then MD present for rounds, with pt remaining verbose.   Transitioned to EOB with mod I. OT switched out bari rollator for standard rollator for practice with mobility device that pt would qualify for if needed at DC. Completed all sit > stands and ambulatory transfers with supervision using rollator during session.   Ambulated to ortho gym, with very slow gait but no LOB. Cues needed to avoid "death grip" on the handles per pt report.   Pt participated in the following dynamic standing balance and endurance tasks to promote independence during BADLs and functional mobility: -placing and retrieving squigz from long mirror, 1 trial per hand. Able to retrieve 4 squigz from floor that were dropped with CGA for balance, mild dizziness reported with leaning forward. Discussed how to avoid dizziness with items dropped at home with reacher or sitting then retrieving.   Ambulated with cues to trial increasing speed, but pt with difficulty maintaining normal speed with multi-tasking and talking. Encouraged pt to trial avoidance of talking and engaging in diaphragmatic breathing instead to prevent de-satting.   Pt remained seated in recliner with BLE elevated, with chair alarm on/activated,  and with all needs in reach at end of session.  Vitals -Received on 2L via [place], SPO2 88% -90-93% after mobility/activity on 2L  -Able to titrate to 1L with O2 sat at 84% with mobility but rebounded to 92%, therefore remained on 1L at end of session   Therapy Documentation Precautions:  Precautions Precautions: Fall Precaution Comments: monitor O2 sat Restrictions Weight Bearing Restrictions: No    Therapy/Group: Individual Therapy  Melvyn Novas, MS, OTR/L  02/22/2023, 12:09 PM

## 2023-02-22 NOTE — Discharge Instructions (Addendum)
Inpatient Rehab Discharge Instructions  [facility] Discharge date and time: 03/05/2023  Activities/Precautions/ Functional Status: Activity: no lifting, driving, or strenuous exercise until cleared by MD Diet: cardiac diet and diabetic diet Wound Care: keep wound clean and dry Functional status:  ___ No restrictions     ___ Walk up steps independently ___ 24/7 supervision/assistance   ___ Walk up steps with assistance __x_ Intermittent supervision/assistance  ___ Bathe/dress independently ___ Walk with walker     ___ Bathe/dress with assistance ___ Walk Independently    ___ Shower independently ___ Walk with assistance    __x_ Shower with assistance _x__ No alcohol     ___ Return to work/school ________  Special Instructions: No driving, alcohol consumption or tobacco use.  Recommend daily BP measurement in same arm and record time of day. Bring this information with you to follow-up appointment with PCP.  COMMUNITY REFERRALS UPON DISCHARGE:    Home Health:   PT     OT                     Agency: [facility]  Phone:[phone]    Medical Equipment/Items Ordered:Rollator                                                 Agency/Supplier: [employer]   My questions have been answered and I understand these instructions. I will adhere to these goals and the provided educational materials after my discharge from the hospital.  Patient/Caregiver Signature _______________________________ Date __________  Clinician Signature _______________________________________ Date __________  Please bring this form and your medication list with you to all your follow-up doctor's appointments.   Information on my medicine - ELIQUIS (apixaban)  Why was Eliquis prescribed for you? Eliquis was prescribed to treat blood clots that may have been found in the veins of your legs (deep vein thrombosis) or in your lungs (pulmonary embolism) and to reduce the risk of them occurring  again.  What do You need to know about Eliquis ? The dose is ONE 5 mg tablet taken TWICE daily.  Eliquis may be taken with or without food.   Try to take the dose about the same time in the morning and in the evening. If you have difficulty swallowing the tablet whole please discuss with your pharmacist how to take the medication safely.  Take Eliquis exactly as prescribed and DO NOT stop taking Eliquis without talking to the doctor who prescribed the medication.  Stopping may increase your risk of developing a new blood clot.  Refill your prescription before you run out.  After discharge, you should have regular check-up appointments with your healthcare provider that is prescribing your Eliquis.    What do you do if you miss a dose? If a dose of ELIQUIS is not taken at the scheduled time, take it as soon as possible on the same day and twice-daily administration should be resumed. The dose should not be doubled to make up for a missed dose.  Important Safety Information A possible side effect of Eliquis is bleeding. You should call your healthcare provider right away if you experience any of the following: Bleeding from an injury or your nose that does not stop. Unusual colored urine (red or dark brown) or unusual colored stools (red or black). Unusual bruising for unknown reasons. A serious fall  or if you hit your head (even if there is no bleeding).  Some medicines may interact with Eliquis and might increase your risk of bleeding or clotting while on Eliquis. To help avoid this, consult your healthcare provider or pharmacist prior to using any new prescription or non-prescription medications, including herbals, vitamins, non-steroidal anti-inflammatory drugs (NSAIDs) and supplements.  This website has more information on Eliquis (apixaban): http://www.eliquis.com/eliquis/home

## 2023-02-22 NOTE — Progress Notes (Signed)
Patient ID: Madison West, female   DOB: 1955-11-16, 67 y.o.   MRN: [id number]  SW met with patient and friend in the room. No questions or concerns currently.

## 2023-02-23 ENCOUNTER — Telehealth (HOSPITAL_COMMUNITY): Payer: Self-pay

## 2023-02-23 ENCOUNTER — Other Ambulatory Visit: Payer: Self-pay | Admitting: Physician Assistant

## 2023-02-23 DIAGNOSIS — Z952 Presence of prosthetic heart valve: Secondary | ICD-10-CM

## 2023-02-23 LAB — BASIC METABOLIC PANEL
Anion gap: 13 (ref 5–15)
BUN: 17 mg/dL (ref 8–23)
CO2: 25 mmol/L (ref 22–32)
Calcium: 9.4 mg/dL (ref 8.9–10.3)
Chloride: 97 mmol/L — ABNORMAL LOW (ref 98–111)
Creatinine, Ser: 0.88 mg/dL (ref 0.44–1.00)
GFR, Estimated: >60 mL/min (ref >60)
Glucose, Bld: 146 mg/dL — ABNORMAL HIGH (ref 70–99)
Potassium: 4.4 mmol/L (ref 3.5–5.1)
Sodium: 135 mmol/L (ref 135–145)

## 2023-02-23 NOTE — Progress Notes (Signed)
Occupational Therapy Session Note  Patient Details  Name: Madison West MRN: [id number] Date of Birth: 06/24/1955  Today's Date: 02/23/2023 OT Individual Time: 1435-1530 OT Individual Time Calculation (min): 55 min    Short Term Goals: Week 1:  OT Short Term Goal 1 (Week 1): STG = LTG 2/2 ELOS  Skilled Therapeutic Interventions/Progress Updates:  Skilled OT intervention completed with focus on functional endurance, ambulatory mobility and ADL retraining within a shower context. Pt received seated in recliner, agreeable to session. Soreness reported in Lt flank; nurse in room to administer meds. OT offered rest breaks, repositioning and moist heat via shower for pain relief.  Completed all sit > stands and ambulatory transfers with supervision using rollator. Cues needed intermittently for safety with rollator and body positioning during mobility with the AD.  Ambulated > toilet, managed all toileting steps for continent void with supervision. Ambulated > BSC in shower, with cues needed to hold onto grab bar during shower entry. Doffed all clothing with supervision.  Pt was able to bathe all parts with intermittent supervision, at the sit > stand level while using grab bar for balance. Utilized long handled sponge to access BLE and back. CGA ambulatory transfer using grab bar > rollator > recliner. Able to donn bra, shirt and deo with set up A, as pt with front clasp bra now. Supervision for threading LB clothing/pad at the sit > stand level without AD.   Set up A for grooming and make up. Pt did need cues to take breathing breaks per O2 sats below and needed recall for safety techniques throughout.  Pt remained seated in recliner, with chair alarm on/activated, and with all needs in reach at end of session.   Vitals -Received on 1L via [place], SPO2 87% -84% after activity on 1L but rebounded to 90% with diaphragmatic breathing therefore remained on 1L at end of session  Therapy  Documentation Precautions:  Precautions Precautions: Fall Precaution Comments: monitor O2 sat Restrictions Weight Bearing Restrictions: No     Therapy/Group: Individual Therapy  Melvyn Novas, MS, OTR/L  02/23/2023, 3:48 PM

## 2023-02-23 NOTE — Progress Notes (Signed)
Speech Language Pathology Daily Session Note  Patient Details  Name: Madison West MRN: [id number] Date of Birth: February 16, 1956  Today's Date: 02/23/2023 SLP Individual Time: 4098-1191 SLP Individual Time Calculation (min): 45 min  Short Term Goals: Week 1: SLP Short Term Goal 1 (Week 1): Pt will complete moderately complex problem solving tasks with >80% accuracy given min cues. SLP Short Term Goal 2 (Week 1): Pt will utilize compensatory memory strategies and aids to recall day to day information and complete memory exercises with >80% accuracy. SLP Short Term Goal 3 (Week 1): Pt will complete medication management task with >90% accuracy given min cues. SLP Short Term Goal 4 (Week 1): Pt will complete financial management tasks with >90% accuracy given min cues.  Skilled Therapeutic Interventions: Skilled treatment session focused on cognitive goals. Upon arrival, patient was awake in bed and requested to transfer to the recliner. Patient transferred with overall supervision with SLP managing oxygen tubing. Patient with several strong coughing episodes in attempts to remove phlegm, O2 saturations:  92% on 1L of supplemental oxygen. SLP facilitated session by providing overall supervision level verbal cues for recall of her current medications and their functions. Patient was also overall Mod I for verbal and functional problem solving during a mildly complex task of organizing a BID pill box. Patient handed off to PT. Continue with current plan of care.      Pain Pain Assessment Pain Scale: 0-10 Pain Score: 5  Pain Type: Acute pain Pain Location: Chest  Therapy/Group: Individual Therapy  Erina Hamme 02/23/2023, 9:01 AM

## 2023-02-23 NOTE — Progress Notes (Signed)
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 Normal cardiac silhouette. Aortic valve noted. Interstitial edema pattern not changed. Mild basilar atelectasis. Small LEFT effusion. IMPRESSION: No significant oval change. Electronically Signed   By: Genevive Bi M.D.   On: 02/21/2023 14:34   Recent Labs    02/22/23 0610  WBC 4.8  HGB 8.6*  HCT 26.7*  PLT 449*   Recent Labs    02/22/23 0610 02/23/23 0732  NA 134* 135  K 4.4 4.4  CL 96* 97*  CO2 30 25  GLUCOSE 106* 146*  BUN 21 17  CREATININE 1.07* 0.88  CALCIUM 9.1 9.4    Intake/Output Summary (Last 24 hours) at 02/23/2023 0948 Last data filed at 02/23/2023 0700 Gross per 24 hour  Intake 590 ml  Output --  Net 590 ml        Physical Exam: Vital Signs Blood pressure (!) 121/58, pulse 64, temperature 98.7 F (37.1 C), resp. rate 18, height 5\' 8"  (1.727 m), weight 101 kg, SpO2 90%. Gen: no distress, normal appearing, BMI 33.86 HEENT: oral mucosa pink and moist, NCAT Cardio: Reg rate Resp: No respiratory distress. No accessory muscle usage.  Right lower > left lower lobe mild rhonci and on 2 L Speed Cardio: Well perfused appearance. No peripheral edema. Abdomen: Mildly distended. Nontender.   Psych: Appropriate mood and affect. Neuro: AAOx4. No apparent cognitive deficits  Skin: Groin catheterization sites well-healed, without apparent hematoma.  Clean, dry, intact.   GU: continent of bladder  Neurologic Exam:   DTRs: Reflexes were 2+ in bilateral achilles, patella, biceps, BR and  triceps. Babinsky: flexor responses b/l.   Hoffmans: negative b/l Sensory exam: revealed normal sensation in all dermatomal regions in bilateral upper extremities and bilateral lower extremities Motor exam: strength 5-/5 throughout bilateral upper extremities and bilateral lower extremities Coordination: Fine motor coordination was normal.  Mild bilateral upper extremity intention tremor   MSK: Tender to palpation over sternum and left chest, no bruising or apparent lesions  Assessment/Plan: 1. Functional deficits which require 3+ hours per day of interdisciplinary therapy in a comprehensive inpatient rehab setting. Physiatrist is providing close team supervision and 24 hour management of active medical problems listed below. Physiatrist and rehab team continue to assess barriers to discharge/monitor patient progress toward functional and medical goals  Care Tool:  Bathing    Body parts bathed by patient: Right arm, Left arm, Chest, Abdomen, Front perineal area, Buttocks, Right upper leg, Left upper leg, Face, Right lower leg, Left lower leg   Body parts bathed by helper: Right lower leg, Left lower leg     Bathing assist Assist Level: Supervision/Verbal cueing     Upper Body Dressing/Undressing Upper body dressing   What is the patient wearing?: Pull over shirt, Bra    Upper body assist Assist Level: Minimal Assistance -  Patient > 75%    Lower Body Dressing/Undressing Lower body dressing      What is the patient wearing?: Underwear/pull up, Pants     Lower body assist Assist for lower body dressing: Minimal Assistance - Patient > 75%     Toileting Toileting    Toileting assist Assist for toileting: Contact Guard/Touching assist     Transfers Chair/bed transfer  Transfers assist     Chair/bed transfer assist level: Contact Guard/Touching assist     Locomotion Ambulation   Ambulation assist      Assist level: Contact Guard/Touching assist Assistive  device: Rollator Max distance: 316ft   Walk 10 feet activity   Assist     Assist level: Contact Guard/Touching assist Assistive device: Rollator   Walk 50 feet activity   Assist Walk 50 feet with 2 turns activity did not occur: Safety/medical concerns (fatigue, global weakness/deconditioning)  Assist level: Contact Guard/Touching assist Assistive device: Rollator    Walk 150 feet activity   Assist Walk 150 feet activity did not occur: Safety/medical concerns (fatigue, global weakness/deconditioning)  Assist level: Contact Guard/Touching assist Assistive device: Rollator    Walk 10 feet on uneven surface  activity   Assist Walk 10 feet on uneven surfaces activity did not occur: Safety/medical concerns (fatigue, global weakness/deconditioning)         Wheelchair     Assist Is the patient using a wheelchair?: Yes Type of Wheelchair: Manual Wheelchair activity did not occur: Safety/medical concerns (fatigue)         Wheelchair 50 feet with 2 turns activity    Assist    Wheelchair 50 feet with 2 turns activity did not occur: Safety/medical concerns (fatigue)       Wheelchair 150 feet activity     Assist  Wheelchair 150 feet activity did not occur: Safety/medical concerns (fatigue)       Blood pressure (!) 121/58, pulse 64, temperature 98.7 F (37.1 C), resp. rate 18, height 5\' 8"  (1.727 m), weight 101 kg, SpO2 90%.  Medical Problem List and Plan: 1. 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  Antithrombotics: -DVT/anticoagulation:  Pharmaceutical: Eliquis 5 mg twice daily             -antiplatelet therapy: Aspirin and Plavix    3. Pain Management: Tylenol,  oxycodone 5 mg as needed             -continue Robaxin 500 mg BID             -continue gabapentin 300 mg TID             -continue Lidoderm patches 1-2 as needed for pain   4. Mood/Behavior/Sleep: LCSW to evaluate and provide emotional support. History of anxiety/depression             -continue Buspar 10 mg BID and  -continue Xanax 3 mg BID and once daily  prn             -refusing Prozac 20 mg q HS>>will discontinue             -  continue melatonin 3 mg q HS             -antipsychotic agents: n/a   5. Neuropsych/cognition: This patient is capable of making decisions on her own behalf.   6. Skin/Wound Care: Routine skin care checks   7. Fluids/Electrolytes/Nutrition: Routine Is and Os and follow-up chemistries             -hyponatremia>>improving, follow-up BMP   8: Hypertension: monitor TID and prn             -see meds #12   9: Hyperlipidemia: continue statin   10: CAD s/p PCI to RCA 2021; to LAD on 8/07: continue DAPT and statin   11: Aortic stenosis s/p TAVR 8/15: DAPT continues   12: Acute systolic CHF; EF ~24-40%; Lasix discontinued             -daily weight             -continue Farxiga 10 mg daily             -continue spironlactone 25 mg daily             -continue Entresto 49-51 BID (this dose started 8/26)   13: Urinary retention: most likely due to constipation and immobility             -Foley now out   14: Refractory VT resolved: continue amiodarone 200 mg BID   15: Carotid stenosis s/p right CEA 2020             -follow-up with [facility] as per protocol   16: Anemia; acute blood loss/other: follow-up CBC   17: Acute DVT left internal jugular>>resolved on recent venous duplex, superficial cephalic vein thrombosis present             -plan is to continue triple therapy and discontinue aspirin 30 days post-PCI              -continue Eliquis 5 mg BID   18: Hx partial vulvectomies with difficult Foley cath placement:  -urology consulted>>Foley placed; removed  8/27   19: Sepsis due to pneumonia>>Enterobacter isolated, antibiotics completed             -Incentive spirometry every 2 hours while awake, placed nursing order to request that this be placed within reach of patient at all times             -As needed oxygen, wean as tolerated   20: Anemia, multifactorial: Hgb reviewed and is stable, repeat Monday   21: Thrombocytosis: trending upward; follow-up CBC  22. Left pleural effusion: stable on CXR, lasix decreased to 10mg  given milkd AKI daily weights ordered  23. Shortness of breath: improved, increased oxygen to 2L, discussed goal O2 is 88-92%, improved, decreased lasix to 10mg   24. History of prediabetes: currently well controlled, d/c CBG checks, d/c Ensure between meals, discussed avoiding added sugar  25. Suboptimal vitamin D level: ergocalciferol 50,000U once per week ordered, continue  25. Obesity: d/c Ensure between meals, provided dietary education regarding whole food protein sources  26. AKI: discussed that creatinine has normalized  27. Hypotension: d/c lasix 10mg   I spent >55mins performing patient care related activities, including face to face time, documentation time, med management, discussion of meds and lab orders with patient, and overall coordination of care.   LOS: 5 days A FACE TO FACE EVALUATION WAS PERFORMED  Madison West 02/23/2023, 9:48 AM

## 2023-02-23 NOTE — Progress Notes (Signed)
Physical Therapy Session Note  Patient Details  Name: Madison West MRN: [id number] Date of Birth: Jul 12, 1955  Today's Date: 02/23/2023 PT Individual Time: [phone] and [phone] PT Individual Time Calculation (min): 56 min and 41 min  Short Term Goals: Week 1:  PT Short Term Goal 1 (Week 1): STG=LTG due to LOS  Skilled Therapeutic Interventions/Progress Updates:   Treatment Session 1 Received pt sitting in recliner - handoff from SLP, then MD arrived for morning rounds. Pt agreeable to PT treatment and reported chronic low back pain and soreness in chest when coughing. Pt on 1L with SPO2 93% at rest. Session with emphasis on functional mobility/transfers, generalized strengthening and endurance, dynamic standing balance/coordination, NMR, stair navigation, and gait training.   Pt performed all transfers with rollator and supervision throughout session - cues for brake management. Pt ambulated 147ft x 2 trials with rollator and supervision to/from main therapy gym. Pt reported her heart felt "funny" - HR 78bpm and O2 94% but symptoms resolved with rest and cues for deep breathing. Ambulated to/from staircase with rollator and supervision and navigated 8 6in steps with 2 handrails and CGA ascending and descending with a step to pattern - SPO2 dropped to 85% but increased to 94% with seated rest and pursed lip breathing.   Ambulated to/from [place] without AD and CGA and performed the following activities with emphasis on dynamic standing balance/coordination, proprioception, and spatial awareness: -weight shift training on level static with BUE support fading to 1UE support, then no UE support with CGA for 2 minutes -weight shift training on level 6 with BUE support for 2 minutes -limits of stability training on level static with BUE support fading to 1UE support for 1 minute and 10 seconds x 1, 50 seconds x 1, and 49 seconds x 1 with close supervision Pt required frequent rest breaks  throughout session due to fatigue and mild SOB. Pt remains hyperverbal and fixated on returning to work when leaving rehab. Returned to room and concluded session with pt sitting in recliner, needs within reach, and chair pad alarm on.   Treatment Session 2 Received pt sitting in recliner, pt agreeable to PT treatment, and did not state pain level during session. Pt on 1L O2 with SPO2 dropping as low as 87% but able to recover >95% throughout session with cues for pursed lip breathing. Session with emphasis on functional mobility/transfers, generalized strengthening and endurance, dynamic standing balance/coordination, and gait training. Pt performed all transfers with rollator and supervision throughout session. Pt ambulated 17ft x 2 trials with rollator and supervision to/from main therapy gym. Worked on mini squats x10 with 1.1lb medicine ball and 2x10 with 3.3lb medicine ball when emphasis on quad strength. Stood without AD and CGA and ambulated 17ft without AD and CGA to fatigue - pt required 1 standing rest break and reported legs felt "heavy". Pt then performed blocked practice sit<>stands without UE support 2x15 with emphasis on LE strength. Returned to room and concluded session with pt sitting in recliner with all needs within reach and NT present at beside checking vitals.   Therapy Documentation Precautions:  Precautions Precautions: Fall Precaution Comments: monitor O2 sat Restrictions Weight Bearing Restrictions: No RLE Weight Bearing: Weight bearing as tolerated   Therapy/Group: Individual Therapy Marlana Salvage Zaunegger Blima Rich PT, DPT 02/23/2023, 6:48 AM

## 2023-02-23 NOTE — Telephone Encounter (Signed)
Pt insurance is active and benefits verified through Medicare A/B. Co-pay $0.00, DED $240.00/$240.00 met, out of pocket $0.00/$0.00 met, co-insurance 20%. No pre-authorization required. [employer], 02/23/23 @ 11:28AM, REF#[id number]   How many CR sessions are covered? (36 visits for TCR, 72 visits for ICR)72 Is this a lifetime maximum or an annual maximum? Lifetime Has the member used any of these services to date? No Is there a time limit (weeks/months) on start of program and/or program completion? No     Will contact patient to see if she is interested in the Cardiac Rehab Program. If interested, patient will need to complete follow up appt. Once completed, patient will be contacted for scheduling upon review by the RN Navigator.

## 2023-02-24 MED ORDER — FUROSEMIDE 20 MG PO TABS
10.0000 mg | ORAL_TABLET | Freq: Every day | ORAL | Status: DC
  Administered 2023-02-24 – 2023-02-25 (×2): 10 mg via ORAL
  Filled 2023-02-24 (×2): qty 1

## 2023-02-24 NOTE — Progress Notes (Signed)
Speech Language Pathology Daily Session Note  Patient Details  Name: Madison West MRN: [id number] Date of Birth: 10/30/1955  Today's Date: 02/24/2023 SLP Individual Time: 0800-0900 SLP Individual Time Calculation (min): 60 min  Short Term Goals: Week 1: SLP Short Term Goal 1 (Week 1): Pt will complete moderately complex problem solving tasks with >80% accuracy given min cues. SLP Short Term Goal 2 (Week 1): Pt will utilize compensatory memory strategies and aids to recall day to day information and complete memory exercises with >80% accuracy. SLP Short Term Goal 3 (Week 1): Pt will complete medication management task with >90% accuracy given min cues. SLP Short Term Goal 4 (Week 1): Pt will complete financial management tasks with >90% accuracy given min cues.  Skilled Therapeutic Interventions: Skilled therapy session focused on cognitive re-training. SLP facilitated session by providing supervision cues during medication management task. Patient was able to utilize problem solving and organizational skills during completion of TID pill box. Patient with mistake x1, though able to identify once SLP provided visual cue. Patient very verbose throughout the session, requiring occasional redirection to task. Patient left in chair with alarm set and call bell in reach. Continue POC.    Pain No pain reported.   Therapy/Group: Individual Therapy  Xylah Early M.A., CF-SLP 02/24/2023, 10:00 AM

## 2023-02-24 NOTE — Progress Notes (Signed)
PROGRESS NOTE   Subjective/Complaints: No new complaints this morning Working with SLP Does not like how many medications she is on  ROS: +shortness of breath- improved, +anxiety- improved   Objective:   No results found. Recent Labs    02/22/23 0610  WBC 4.8  HGB 8.6*  HCT 26.7*  PLT 449*   Recent Labs    02/22/23 0610 02/23/23 0732  NA 134* 135  K 4.4 4.4  CL 96* 97*  CO2 30 25  GLUCOSE 106* 146*  BUN 21 17  CREATININE 1.07* 0.88  CALCIUM 9.1 9.4    Intake/Output Summary (Last 24 hours) at 02/24/2023 1051 Last data filed at 02/24/2023 7829 Gross per 24 hour  Intake 1080 ml  Output --  Net 1080 ml        Physical Exam: Vital Signs Blood pressure 122/69, pulse 72, temperature 98.4 F (36.9 C), resp. rate 16, height 5\' 8"  (1.727 m), weight 100.1 kg, SpO2 93%. Gen: no distress, normal appearing, BMI 33.55 Gen: no distress, normal appearing HEENT: oral mucosa pink and moist, NCAT Cardio: Reg rate Chest: normal effort, normal rate of breathing Abd: soft, non-distended Ext: no edema Psych: pleasant, normal affect Neuro: AAOx4. No apparent cognitive deficits  Skin: Groin catheterization sites well-healed, without apparent hematoma.  Clean, dry, intact.   GU: continent of bladder  Neurologic Exam:   DTRs: Reflexes were 2+ in bilateral achilles, patella, biceps, BR and triceps. Babinsky: flexor responses b/l.   Hoffmans: negative b/l Sensory exam: revealed normal sensation in all dermatomal regions in bilateral upper extremities and bilateral lower extremities Motor exam: strength 5-/5 throughout bilateral upper extremities and bilateral lower extremities Coordination: Fine motor coordination was normal.  Mild bilateral upper extremity intention tremor   MSK: Tender to palpation over sternum and left chest, no bruising or apparent lesions  Assessment/Plan: 1. Functional deficits which require 3+  hours per day of interdisciplinary therapy in a comprehensive inpatient rehab setting. Physiatrist is providing close team supervision and 24 hour management of active medical problems listed below. Physiatrist and rehab team continue to assess barriers to discharge/monitor patient progress toward functional and medical goals  Care Tool:  Bathing    Body parts bathed by patient: Right arm, Left arm, Chest, Abdomen, Front perineal area, Buttocks, Right upper leg, Left upper leg, Face, Right lower leg, Left lower leg   Body parts bathed by helper: Right lower leg, Left lower leg     Bathing assist Assist Level: Supervision/Verbal cueing     Upper Body Dressing/Undressing Upper body dressing   What is the patient wearing?: Pull over shirt, Bra    Upper body assist Assist Level: Minimal Assistance - Patient > 75%    Lower Body Dressing/Undressing Lower body dressing      What is the patient wearing?: Underwear/pull up, Pants     Lower body assist Assist for lower body dressing: Minimal Assistance - Patient > 75%     Toileting Toileting    Toileting assist Assist for toileting: Contact Guard/Touching assist     Transfers Chair/bed transfer  Transfers assist     Chair/bed transfer assist level: Contact Guard/Touching assist     Locomotion  Ambulation   Ambulation assist      Assist level: Contact Guard/Touching assist Assistive device: Rollator Max distance: 379ft   Walk 10 feet activity   Assist     Assist level: Contact Guard/Touching assist Assistive device: Rollator   Walk 50 feet activity   Assist Walk 50 feet with 2 turns activity did not occur: Safety/medical concerns (fatigue, global weakness/deconditioning)  Assist level: Contact Guard/Touching assist Assistive device: Rollator    Walk 150 feet activity   Assist Walk 150 feet activity did not occur: Safety/medical concerns (fatigue, global weakness/deconditioning)  Assist level:  Contact Guard/Touching assist Assistive device: Rollator    Walk 10 feet on uneven surface  activity   Assist Walk 10 feet on uneven surfaces activity did not occur: Safety/medical concerns (fatigue, global weakness/deconditioning)         Wheelchair     Assist Is the patient using a wheelchair?: Yes Type of Wheelchair: Manual Wheelchair activity did not occur: Safety/medical concerns (fatigue)         Wheelchair 50 feet with 2 turns activity    Assist    Wheelchair 50 feet with 2 turns activity did not occur: Safety/medical concerns (fatigue)       Wheelchair 150 feet activity     Assist  Wheelchair 150 feet activity did not occur: Safety/medical concerns (fatigue)       Blood pressure 122/69, pulse 72, temperature 98.4 F (36.9 C), resp. rate 16, height 5\' 8"  (1.727 m), weight 100.1 kg, SpO2 93%.  Medical Problem List and Plan: 1. 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  Current use of Antithrombotics/Antiplatelets: continue Eliquis 5 mg twice daily             -antiplatelet therapy: Aspirin and Plavix. D/c date for aspirin placed for 9/8 as per Dr. Nicki Guadalajara; discussed with patient   3. Pain: Tylenol, oxycodone 5 mg as needed             -continue Robaxin 500 mg BID             -continue gabapentin 300 mg TID             -continue Lidoderm patches 1-2 as needed for pain  -kpad ordered   4. Anxiety:             -continue Buspar 10 mg BID and  -continue Xanax 3 mg BID and once daily  prn             -refusing Prozac 20 mg q HS>>will discontinue             -continue melatonin 3 mg q HS             -antipsychotic agents: n/a   5.  Neuropsych/cognition: This patient is capable of making decisions on her own behalf.   6. Skin/Wound Care: Routine skin care checks   7. Fluids/Electrolytes/Nutrition: Routine Is and Os and follow-up chemistries             -hyponatremia>>improving, follow-up BMP  8: Hypertension: monitor TID and prn             -see meds #12   9: Hyperlipidemia: continue statin   10: CAD s/p PCI to RCA 2021; to LAD on 8/07: continue DAPT and statin   11: Aortic stenosis s/p TAVR 8/15: DAPT continues   12: Acute systolic CHF; EF ~10-27%; Lasix discontinued             -daily weight             -continue Farxiga 10 mg daily             -continue spironlactone 25 mg daily             -continue Entresto 49-51 BID (this dose started 8/26)   13: Urinary retention: most likely due to constipation and immobility             -Foley now out   14: Refractory VT resolved: continue amiodarone 200 mg BID   15: Carotid stenosis s/p right CEA 2020             -follow-up with [facility] as per protocol   16: Anemia; acute blood loss/other: follow-up CBC   17: Acute DVT left internal jugular>>resolved on recent venous duplex, superficial cephalic vein thrombosis present             -plan is to continue triple therapy and discontinue aspirin 30 days post-PCI              -continue Eliquis 5 mg BID   18: Hx partial vulvectomies with difficult Foley cath placement:  -urology consulted>>Foley placed; removed 8/27   19: Sepsis due to pneumonia>>Enterobacter isolated, antibiotics completed             -Incentive spirometry every 2 hours while awake, placed nursing order to request that this be placed within reach of patient at all times             -As needed oxygen, wean as tolerated   20: Anemia, multifactorial: Hgb reviewed and is stable, repeat Monday   21: Thrombocytosis: trending upward; follow-up CBC  22. Left pleural effusion: stable on CXR, lasix decreased to 10mg  given milkd AKI daily weights  ordered  23. Shortness of breath: improved, increased oxygen to 2L, discussed goal O2 is 88-92%, improved, decreased lasix to 10mg   24. History of prediabetes: currently well controlled, d/c CBG checks, d/c Ensure between meals, discussed avoiding added sugar  25. Suboptimal vitamin D level: ergocalciferol 50,000U once per week ordered, continue  25. Obesity: d/c Ensure between meals, provided dietary education regarding whole food protein sources  26. AKI: discussed that creatinine has normalized, discussed that AKI could have been caused by Lasix.   27. Hypotension: d/c lasix 10mg , discussed with patient  I spent >35mins performing patient care related activities, including face to face time, documentation time, med management, discussion of meds and lab orders with patient, and overall coordination of care.   LOS: 6 days A FACE TO FACE EVALUATION WAS PERFORMED  Drema Pry Glynda Soliday 02/24/2023, 10:51 AM

## 2023-02-24 NOTE — Progress Notes (Signed)
Physical Therapy Session Note  Patient Details  Name: Madison West MRN: [id number] Date of Birth: 1955-09-30  Today's Date: 02/24/2023 PT Individual Time: 1124-1204, 4098-1191 PT Individual Time Calculation (min): 40 min, 29 min   Short Term Goals: Week 1:  PT Short Term Goal 1 (Week 1): STG=LTG due to LOS  Skilled Therapeutic Interventions/Progress Updates:      Treatment Session 1   Pt seated in recliner upon arrival. Pt agreeable to therapy. Pt denies any pain. Pt on 1L O2 at 92-100% throughout session (increased to 100% with seated rest break, pursed lip breathing). Pt managing oxygen tank throughout session.   Pt ambulated with no AD and CGA to bathroom. Pt continent of urine. Pt performed pericare and donned and doffed pants with CGA.   Pt ambulated with rollator from room to main gym with supervision, verbal and tactile cues provided for safety with rollator, with emphasis on unlocking brakes and standing closer to rollator.   Ambulated 1x35 feet forward, 1x35 feet backwards, and 1x35 lateral step in both directions with B UE support on rail progressing to no UE support and CGA, verbal cues provided for increased step length.   Pt stood on green line as visual cues in tandem x1 min B with R HHA.   Pt stood with feet together x1 min with intermittent CGA needed for stability, progressed to standing while anterior/posterior/lateral perturbations at shoulder, pt requiring min A for correction of 1 episode of LOB, pt did not demo any stepping strategy for balance recovery.   Pt ambulated with rollator main gym to room with supervision, pt demos improved carryover of safety with rollator. Verbal cues provided for increased gait speed.   Pt seated in recliner at end of session with needs within reach and chair alarm on.   Treatment Session 2   Pt seated in recliner upon arrival. Pt agreeable to therapy. Pt denies any pain. Pt on 1L O2 at 92-100% throughout session (increased  to 100% with seated rest break, pursed lip breathing). Pt managing oxygen tank throughout session.   Ambulated from room<>main gym with Rollator and supervision.   Sit<>stand with arms across chest, and feet on airex pad x10 with CGA. Stood with feet together x1 min on airex pad with close supervision pt progressed to standing with feet together on airex pad while performing 1x10  B cross body reaches with CGA/min A.    Ambulated main gym to room with rollator and supervision, verbal cues provided for safety with rollator.   Pt seated in recliner at end of session with all needs within reach and chair alarm on.   Therapy Documentation Precautions:  Precautions Precautions: Fall Precaution Comments: monitor O2 sat Restrictions Weight Bearing Restrictions: No RLE Weight Bearing: Weight bearing as tolerated  Therapy/Group: Individual Therapy  [facility] Ambrose Finland, [place], DPT  02/24/2023, 7:49 AM

## 2023-02-24 NOTE — Progress Notes (Signed)
Patient ID: Madison West, female   DOB: 13-Nov-1955, 67 y.o.   MRN: [id number]  Patient currently in bathroom. Sw will FU with team conference updates.

## 2023-02-24 NOTE — Patient Care Conference (Signed)
Inpatient RehabilitationTeam Conference and Plan of Care Update Date: 02/24/2023   Time: 11:37 AM    Patient Name: Madison West      Medical Record Number: [id number]  Date of Birth: 03-31-1956 Sex: Female         Room/Bed: 4M04C/4M04C-01 Payor Info: Payor: MEDICARE / Plan: MEDICARE PART A AND B / Product Type: *No Product type* /    Admit Date/Time:  02/18/2023  2:38 PM  Primary Diagnosis:  Debility  Hospital Problems: Principal Problem:   Debility    Expected Discharge Date: Expected Discharge Date: 03/01/23  Team Members Present: Physician leading conference: Dr. Sula Soda Social Worker Present: Lavera Guise, BSW Nurse Present: Chana Bode, RN PT Present: Wynelle Link, PT OT Present: Roney Mans, OT SLP Present: Jeannie Done, SLP PPS Coordinator present : Edson Snowball, PT     Current Status/Progress Goal Weekly Team Focus  Bowel/Bladder   Patient has some issues with urgency.   To ger to batheroom fater and earlier   Bathroom schedule if possible.  she is on diuretics at home be sure to take these in the morning when up and alert.    Swallow/Nutrition/ Hydration               ADL's   Min A UB dressing for bra management, CGA LB dressing, CGA toileting. Needs cues for sequencing and recall occassionally but doing well overall; trying to wean from O2 slowly   Supervision/mod I   Barriers- endurance, dynamic balance, supplemental O2 dependency    Mobility   transfers with rollator CGA/supervision, gait >157ft with rollator and CGA/supervision (cues for energy conservation and brake safety), 4 steps 2 rails min A   Mod I, supervision steps  barriers: supplemental O2 requirements, global weakness/deconditioning, education on energy conservation strategies    Communication                Safety/Cognition/ Behavioral Observations  Supervision-Mod I   Mod I   complex problem solving, selective attention, recall with use of memory  compensatory strategies    Pain   Patient uses 1-10 pain scale   To remain in a state of controlled or  no pain.   find as many opioid free methods of pain controll as possible.  Patient take    Skin   Patient skin is in excellent condition.   Remain pain free  Encourage 1-10 pain scale use and alternative to opios      Discharge Planning:  Patient wll require to d/c home at a MOD I level due to lack of assist. Pt to only have PRN assist at d/c.   Team Discussion: Patient with debility, anxiety, poor endurance, needs cues for sequencing and energy conservation.  Patient on target to meet rehab goals: yes, currently needs CGA for ambulation up to 150'. Limited by left pleural effusion with shortness of breath and remains oxygen dependent. Goals for discharge set for supervision - mod I overall.  *See Care Plan and progress notes for long and short-term goals.   Revisions to Treatment Plan:  N/a   Teaching Needs: Safety, medications, transfers, toileting, etc.   Current Barriers to Discharge: Decreased caregiver support  Possible Resolutions to Barriers: Family education OP follow up services     Medical Summary Current Status: anxiety, chest and arm pain, shorntess of breath left pleural effusion, hypertension, obesity  Barriers to Discharge: Medical stability  Barriers to Discharge Comments: anxiety, chest and arm pain, shorntess of breath left pleural effusion,  hypertension, obesity Possible Resolutions to [employer] Focus: conitnue xanax and buspar, continue oxycodone and lidocaine patches, continue incentive spirometer, discontinued lasix, will add back low dose   Continued Need for Acute Rehabilitation Level of Care: The patient requires daily medical management by a physician with specialized training in physical medicine and rehabilitation for the following reasons: Direction of a multidisciplinary physical rehabilitation program to maximize functional  independence : Yes Medical management of patient stability for increased activity during participation in an intensive rehabilitation regime.: Yes Analysis of laboratory values and/or radiology reports with any subsequent need for medication adjustment and/or medical intervention. : Yes   I attest that I was present, lead the team conference, and concur with the assessment and plan of the team.   Chana Bode B 02/24/2023, 7:55 PM

## 2023-02-24 NOTE — Progress Notes (Signed)
Patient ID: Madison West, female   DOB: [birth year], 67 y.o.   MRN: [id number]  Patient currently with PT. Sw will continue to FU.

## 2023-02-24 NOTE — Progress Notes (Signed)
Occupational Therapy Session Note  Patient Details  Name: Madison West MRN: [id number] Date of Birth: 01-02-1956  Today's Date: 02/24/2023 OT Individual Time: 4098-1191 OT Individual Time Calculation (min): 42 min    Short Term Goals: Week 1:  OT Short Term Goal 1 (Week 1): STG = LTG 2/2 ELOS  Skilled Therapeutic Interventions/Progress Updates:    Patient received seated in recliner.  Patient declined shower stating - "I showered later yesterday."  Patient walked to sink to complete grooming - worked to complete tasks in standing to improve stand tolerance.  Patient initially with poor standing balance - significant lateral sway, then posterior sway while standing. Patient able to self correct in standing by contact with counter top.  Patient removed O2 to brush teeth, then reapplied.  O2 monitored during session at 1L via [place] and patient remained between 85-95%.  Did well consistently with deep breathing - 4 count - to increase O2 saturation reading.   Patient left up in recliner with pad alarm in place and engaged and personal items in reach.    Therapy Documentation Precautions:  Precautions Precautions: Fall Precaution Comments: monitor O2 sat Restrictions Weight Bearing Restrictions: No RLE Weight Bearing: Weight bearing as tolerated   Pain: Denies pain    Therapy/Group: Individual Therapy  Collier Salina 02/24/2023, 3:20 PM

## 2023-02-24 NOTE — Progress Notes (Signed)
Occupational Therapy Session Note  Patient Details  Name: Klaudia Shiplett MRN: [id number] Date of Birth: 08-16-1955  Today's Date: 02/24/2023 OT Individual Time: 1302-1330 OT Individual Time Calculation (min): 28 min    Short Term Goals: Week 1:  OT Short Term Goal 1 (Week 1): STG = LTG 2/2 ELOS  Skilled Therapeutic Interventions/Progress Updates:    Patient received seated in recliner.  Patient agreeable to OT and ambulated with rollator to gym with assistance to manage supplemental O2 on cart.  Cueing and facilitation while walking to maintain upright posture and to reduce weight through arms.  Worked on standing balance - standing with legs not braced on mat table.  Tossing, catching a ball, working on sit to stand with decreased reliance on Ue's.  Again worked on slowing breathing/ deep breathing to increase O2 saturation.   Patient left up in recliner with pad alarm in place and engaged, and personal items in reach.    Therapy Documentation Precautions:  Precautions Precautions: Fall Precaution Comments: monitor O2 sat Restrictions Weight Bearing Restrictions: No RLE Weight Bearing: Weight bearing as tolerated  Pain: Denies pain      Therapy/Group: Individual Therapy  Collier Salina 02/24/2023, 3:28 PM

## 2023-02-24 NOTE — Progress Notes (Signed)
Patient ID: Madison West, female   DOB: [birth year], 67 y.o.   MRN: [id number]  Team Conference Report to Patient/Family  Team Conference discussion was reviewed with the patient and caregiver, including goals, any changes in plan of care and target discharge date.  Patient and caregiver express understanding and are not in agreement (see progress notes).  The patient has a target discharge date of 03/01/23.  SW met with patient to discuss team conference updates. Patient making progress but feels as she will not be ready for d/c by Monday.  Patient concerned due to not having assistance in the home. Patient will have PRN assistance from her children and her son in law.   Patient prefers OP follow up due to having 6 dogs in the home and potentially will not have assistance with putting them in cages. Patient plans to establish transportation to OP. Patient aware Rollator to be ordered through [employer].   Sw will discuss patient concerns with team. No additional questions or concerns.  Andria Rhein 02/24/2023, 3:31 PM

## 2023-02-25 ENCOUNTER — Inpatient Hospital Stay (HOSPITAL_COMMUNITY): Payer: Medicare Other

## 2023-02-25 NOTE — Progress Notes (Addendum)
Physical Therapy Session Note  Patient Details  Name: Madison West MRN: [id number] Date of Birth: 01-23-1956  Today's Date: 02/25/2023 PT Individual Time: 5784-6962 PT Individual Time Calculation (min): 57 min   Short Term Goals: Week 1:  PT Short Term Goal 1 (Week 1): STG=LTG due to LOS  Skilled Therapeutic Interventions/Progress Updates:   Received pt sitting in recliner, pt agreeable to PT treatment and did not state pain level during session. Session with emphasis on functional mobility/transfers, generalized strengthening and endurance, dynamic standing balance/coordination, and gait training. Pt on 1L O2 via [place] - titrated to RA and SPO2 dropping as low as 80% but mostly 88% and above and pt able to very quickly recover to >95% with cues for pursed lip breathing.  Pt performed all transfers with rollator and supervision throughout session. Pt ambulated 117ft x 2 trials with rollator and supervision to/from main therapy gym. Pt required max cues to stop talking while ambulating to conserve energy but demonstrates very poor carry over. Pt reporting increased dizziness after ambulating - BP: 85/63 on trial 1 and 96/65 on trial 2. Donned knee high teds and non-skid socks with supervision and increased time/effort and BP reassessed: 90/54 - MD notified and planning to decrease Lasix.   Discussed D/C date and pt expressing desire to stay until next Friday. Discussed pt's CLOF (supervision) and goals (mod I) and that pt is near goal level. Also reminded pt that she will receive OPPT to conitnue working on endurance/strength deficits. Emphasized importance of utilizing energy conservation strategies and limiting how much she talks with mobility, as this is what causes her O2 to decrease.   Requested Neuropsych consult for pt as she expressed feeling anxious regarding going home with her daughter and son in law, reporting she is the primary one who handles all the household duties. Pt also  requesting repeat chest x-ray prior to D/C - MD notified. Concluded session with pt sitting in recliner, needs within reach, and seatbelt alarm on. Pt left on RA with SPO2 95%.  Therapy Documentation Precautions:  Precautions Precautions: Fall Precaution Comments: monitor O2 sat Restrictions Weight Bearing Restrictions: (P) No RLE Weight Bearing: (P) Weight bearing as tolerated  Therapy/Group: Individual Therapy Marlana Salvage Zaunegger Blima Rich PT, DPT 02/25/2023, 7:06 AM

## 2023-02-25 NOTE — Progress Notes (Signed)
PROGRESS NOTE   Subjective/Complaints: Does not feel she would be ready to go home by Monday, discussed with team, discussed with patient that we have modI goals for her  ROS: +shortness of breath- improved, currently on 1L O2, +anxiety- improved   Objective:   No results found. No results for input(s): "WBC", "HGB", "HCT", "PLT" in the last 72 hours.  Recent Labs    02/23/23 0732  NA 135  K 4.4  CL 97*  CO2 25  GLUCOSE 146*  BUN 17  CREATININE 0.88  CALCIUM 9.4    Intake/Output Summary (Last 24 hours) at 02/25/2023 1018 Last data filed at 02/25/2023 0940 Gross per 24 hour  Intake 1056 ml  Output --  Net 1056 ml        Physical Exam: Vital Signs Blood pressure 116/65, pulse 68, temperature 97.9 F (36.6 C), temperature source Oral, resp. rate 15, height 5\' 8"  (1.727 m), weight 100.1 kg, SpO2 98%. Gen: no distress, normal appearing, BMI 33.55 Gen: no distress, normal appearing HEENT: oral mucosa pink and moist, NCAT Cardio: Reg rate Chest: normal effort, normal rate of breathing Abd: soft, non-distended Ext: no edema Psych: pleasant, normal affect, +anxiety Neuro: AAOx4. No apparent cognitive deficits  Skin: Groin catheterization sites well-healed, without apparent hematoma.  Clean, dry, intact.   GU: continent of bladder  Neurologic Exam:   DTRs: Reflexes were 2+ in bilateral achilles, patella, biceps, BR and triceps. Babinsky: flexor responses b/l.   Hoffmans: negative b/l Sensory exam: revealed normal sensation in all dermatomal regions in bilateral upper extremities and bilateral lower extremities Motor exam: strength 5-/5 throughout bilateral upper extremities and bilateral lower extremities Coordination: Fine motor coordination was normal.  Mild bilateral upper extremity intention tremor   MSK: Tender to palpation over sternum and left chest, no bruising or apparent lesions  Assessment/Plan: 1.  Functional deficits which require 3+ hours per day of interdisciplinary therapy in a comprehensive inpatient rehab setting. Physiatrist is providing close team supervision and 24 hour management of active medical problems listed below. Physiatrist and rehab team continue to assess barriers to discharge/monitor patient progress toward functional and medical goals  Care Tool:  Bathing    Body parts bathed by patient: Right arm, Left arm, Chest, Abdomen, Front perineal area, Buttocks, Right upper leg, Left upper leg, Face, Right lower leg, Left lower leg   Body parts bathed by helper: Right lower leg, Left lower leg     Bathing assist Assist Level: Supervision/Verbal cueing     Upper Body Dressing/Undressing Upper body dressing   What is the patient wearing?: Pull over shirt, Bra    Upper body assist Assist Level: Minimal Assistance - Patient > 75%    Lower Body Dressing/Undressing Lower body dressing      What is the patient wearing?: Underwear/pull up, Pants     Lower body assist Assist for lower body dressing: Minimal Assistance - Patient > 75%     Toileting Toileting    Toileting assist Assist for toileting: Contact Guard/Touching assist     Transfers Chair/bed transfer  Transfers assist     Chair/bed transfer assist level: Contact Guard/Touching assist     Locomotion Ambulation  Ambulation assist      Assist level: Contact Guard/Touching assist Assistive device: Rollator Max distance: 375ft   Walk 10 feet activity   Assist     Assist level: Contact Guard/Touching assist Assistive device: Rollator   Walk 50 feet activity   Assist Walk 50 feet with 2 turns activity did not occur: Safety/medical concerns (fatigue, global weakness/deconditioning)  Assist level: Contact Guard/Touching assist Assistive device: Rollator    Walk 150 feet activity   Assist Walk 150 feet activity did not occur: Safety/medical concerns (fatigue, global  weakness/deconditioning)  Assist level: Contact Guard/Touching assist Assistive device: Rollator    Walk 10 feet on uneven surface  activity   Assist Walk 10 feet on uneven surfaces activity did not occur: Safety/medical concerns (fatigue, global weakness/deconditioning)         Wheelchair     Assist Is the patient using a wheelchair?: Yes Type of Wheelchair: Manual Wheelchair activity did not occur: Safety/medical concerns (fatigue)         Wheelchair 50 feet with 2 turns activity    Assist    Wheelchair 50 feet with 2 turns activity did not occur: Safety/medical concerns (fatigue)       Wheelchair 150 feet activity     Assist  Wheelchair 150 feet activity did not occur: Safety/medical concerns (fatigue)       Blood pressure 116/65, pulse 68, temperature 97.9 F (36.6 C), temperature source Oral, resp. rate 15, height 5\' 8"  (1.727 m), weight 100.1 kg, SpO2 98%.  Medical Problem List and Plan: 1. 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  Current use of Antithrombotics/Antiplatelets: continue Eliquis 5 mg twice daily             -antiplatelet therapy: Aspirin and Plavix. D/c date for aspirin placed for 9/8 as per Dr. Nicki Guadalajara; discussed with patient   3. Pain: Tylenol, oxycodone 5 mg as needed             -continue Robaxin 500 mg BID             -continue gabapentin 300 mg TID             -continue Lidoderm patches 1-2 as needed for pain  -kpad ordered   4. Anxiety:              -continue Buspar 10 mg BID and  -continue Xanax 3 mg BID and once daily  prn             -refusing Prozac 20 mg q HS>>will discontinue             -continue melatonin 3 mg q HS             -antipsychotic agents: n/a   5. Neuropsych/cognition: This patient is capable of making decisions on her own behalf.   6. Skin/Wound  Care: Routine skin care checks   7. Fluids/Electrolytes/Nutrition: Routine Is and Os and follow-up chemistries             -hyponatremia>>improving, follow-up BMP   8: Hypertension: monitor TID and prn             -see meds #12   9: Hyperlipidemia: continue statin   10: CAD s/p PCI to RCA 2021; to LAD on 8/07: continue DAPT and statin   11: Aortic stenosis s/p TAVR 8/15: DAPT continues   12: Acute systolic CHF; EF ~16-10%; Lasix discontinued             -daily weight             -continue Farxiga 10 mg daily             -continue spironlactone 25 mg daily             -continue Entresto 49-51 BID (this dose started 8/26)   13: Urinary retention: most likely due to constipation and immobility             -Foley now out   14: Refractory VT resolved: continue amiodarone 200 mg BID   15: Carotid stenosis s/p right CEA 2020             -follow-up with [facility] as per protocol   16: Anemia; acute blood loss/other: follow-up CBC   17: Acute DVT left internal jugular>>resolved on recent venous duplex, superficial cephalic vein thrombosis present             -plan is to continue triple therapy and discontinue aspirin 30 days post-PCI              -continue Eliquis 5 mg BID   18: Hx partial vulvectomies with difficult Foley cath placement:  -urology consulted>>Foley placed; removed 8/27   19: Sepsis due to pneumonia>>Enterobacter isolated, antibiotics completed             -Incentive spirometry every 2 hours while awake, placed nursing order to request that this be placed within reach of patient at all times             -As needed oxygen, wean as tolerated    20: Anemia, multifactorial: Hgb reviewed and is stable, repeat Monday   21: Thrombocytosis: trending upward; follow-up CBC  22. Left pleural effusion: stable on CXR, lasix decreased to 10mg  given milkd AKI daily weights ordered  23. Shortness of breath: improved, increased oxygen to 2L, discussed goal O2 is 88-92%, improved, decreased lasix to 10mg   24. History of prediabetes: currently well controlled, d/c CBG checks, d/c Ensure between meals, discussed avoiding added sugar  25. Suboptimal vitamin D level: ergocalciferol 50,000U once per week ordered, continue  25. Obesity: d/c Ensure between meals, provided dietary education regarding whole food protein sources. Monitor daily weights  26. AKI: discussed that creatinine has normalized, discussed that AKI could have been caused by Lasix. Monitor creatinine weekly.   27. Hypotension: d/c lasix 10mg , discussed with patient, resolved  I spent >63mins performing patient care related activities, including face to face time, documentation time, med management, discussion of meds and lab orders with patient, and overall coordination of care.     LOS: 7 days A FACE TO FACE EVALUATION WAS PERFORMED  Ayomide Purdy P Matei Magnone 02/25/2023, 10:18 AM

## 2023-02-25 NOTE — Progress Notes (Signed)
Physical Therapy Session Note  Patient Details  Name: Madison West MRN: [id number] Date of Birth: April 03, 1956  Today's Date: 02/25/2023 PT Individual Time: 0803-0858 PT Individual Time Calculation (min): 55 min   Short Term Goals: Week 1:  PT Short Term Goal 1 (Week 1): STG=LTG due to LOS  Skilled Therapeutic Interventions/Progress Updates: Pt presented in bed agreeable to therapy. Pt denies pain at rest. Session focused on functional mobility and cardiovascular endurance. Pt completed bed mobility with supervision and ambulated to sink to complete oral hygiene. Pt initially at 94% on 1L supplemental O2 and was able to complete oral hygiene and brushed hair on RA however pt noted that she had increasing fatigue. Upon seated rest pt noted to be 86% with supplemental O2 provided and increased within 30sec to 90%. Pt then transported to main gym with rollator and supervision. Pt required intermittent cues for safety with RW as pt sat at mat without locking and when standing did not check locks. In gym O2 at 88% on 1L and increased to 90% within 15 second. Pt then participated in use of rebounder tossing basketball forward then with truncal rotation L/R x 20 each. Pt consistently at 88% after rounds with increase to 90% within 10-15 seconds. MD arrived as pt preparing to ambulate back to room with pt voicing concerns regarding d/c and requesting to extend date. MD advised with discuss with primary team. Upon return back to room pt sat in recliner with pt left seated with chair alarm on, call bell within reach and needs met.      Therapy Documentation Precautions:  Precautions Precautions: Fall Precaution Comments: monitor O2 sat Restrictions Weight Bearing Restrictions: No RLE Weight Bearing: (P) Weight bearing as tolerated General:   Vital Signs: Therapy Vitals Temp: 97.7 F (36.5 C) Temp Source: Oral Pulse Rate: 71 Resp: 19 BP: 99/63 Patient Position (if appropriate):  Sitting Oxygen Therapy SpO2: 95 % O2 Device: Room Air Pain:   Mobility:   Locomotion :    Trunk/Postural Assessment :    Balance:   Exercises:   Other Treatments:      Therapy/Group: Individual Therapy  Kassondra Geil 02/25/2023, 2:39 PM

## 2023-02-25 NOTE — Progress Notes (Signed)
Occupational Therapy Session Note  Patient Details  Name: Madison West MRN: [id number] Date of Birth: [birth year]  Today's Date: 02/25/2023 OT Individual Time: 7106-2694 OT Individual Time Calculation (min): 54 min    Short Term Goals: Week 1:  OT Short Term Goal 1 (Week 1): STG = LTG 2/2 ELOS  Skilled Therapeutic Interventions/Progress Updates:  Pt greeted seated in recliner, pt agreeable to OT intervention.pt reports anxiety with today being the first day without O2, pt reports low BP earlier in previous session, BP assessed as indicated below:   Sitting- 99/59( 71) HR 69  Standing- 104/55 ( 71) HR 70   D/t low BP, pt deferred shower. Pt completed wash up at sink with overall supervision, pt took seated rest breaks as needed d/t low energy. Pt on RA throughout bathing   with SpO2 >90%. Education provided on using pulse ox at home to monitor sats.   Pt completed dressing with overall supervision. Pt additionally completed ambulatory toilet transfer with RW and supervision, continent urine void with pt completing 3/3 toileting tasks with supervision. Pt very tangential during session needing redirection at times to attend to task.           Ended session with pt seated in recliner with all needs within reach and chair alarm activated.                    Therapy Documentation Precautions:  Precautions Precautions: Fall Precaution Comments: monitor O2 sat Restrictions Weight Bearing Restrictions: No RLE Weight Bearing: (P) Weight bearing as tolerated  Pain: no pain reported during session     Therapy/Group: Individual Therapy  Pollyann Glen [facility] 02/25/2023, 12:21 PM

## 2023-02-25 NOTE — Progress Notes (Signed)
Speech Language Pathology Daily Session Note  Patient Details  Name: Kamika Pirrello MRN: [id number] Date of Birth: 07-May-1956  Today's Date: 02/25/2023 SLP Individual Time: 1300-1400 SLP Individual Time Calculation (min): 60 min  Short Term Goals: Week 1: SLP Short Term Goal 1 (Week 1): Pt will complete moderately complex problem solving tasks with >80% accuracy given min cues. SLP Short Term Goal 2 (Week 1): Pt will utilize compensatory memory strategies and aids to recall day to day information and complete memory exercises with >80% accuracy. SLP Short Term Goal 3 (Week 1): Pt will complete medication management task with >90% accuracy given min cues. SLP Short Term Goal 4 (Week 1): Pt will complete financial management tasks with >90% accuracy given min cues.  Skilled Therapeutic Interventions: Skilled therapy session focused on cognitive goals. SLP facilitated session by providing minA during financial management activity. 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 Patient left in chair with alarm set and call bell in reach. Continue POC.   Pain No pain reported  Therapy/Group: Individual Therapy  Meeya Goldin M.A., CF-SLP 02/25/2023, 3:01 PM

## 2023-02-26 MED ORDER — AMIODARONE HCL 200 MG PO TABS
200.0000 mg | ORAL_TABLET | Freq: Every day | ORAL | Status: DC
  Administered 2023-02-27 – 2023-03-05 (×7): 200 mg via ORAL
  Filled 2023-02-26 (×7): qty 1

## 2023-02-26 MED ORDER — SPIRONOLACTONE 12.5 MG HALF TABLET
12.5000 mg | ORAL_TABLET | Freq: Every day | ORAL | Status: DC
  Administered 2023-02-27 – 2023-03-05 (×7): 12.5 mg via ORAL
  Filled 2023-02-26 (×7): qty 1

## 2023-02-26 MED ORDER — SACUBITRIL-VALSARTAN 24-26 MG PO TABS
1.0000 | ORAL_TABLET | Freq: Two times a day (BID) | ORAL | Status: DC
  Administered 2023-02-26 – 2023-03-05 (×14): 1 via ORAL
  Filled 2023-02-26 (×16): qty 1

## 2023-02-26 NOTE — Progress Notes (Signed)
PROGRESS NOTE   Subjective/Complaints: Doing great but still feels unready for d/c home Monday. Her concerns are her hypotension and her daughters not being home with her on Monday  ROS: shortness of breath resolved, +anxiety- improved   Objective:   DG Chest 2 View  Result Date: 02/25/2023 CLINICAL DATA:  Follow-up pulmonary edema. EXAM: CHEST - 2 VIEW COMPARISON:  Radiographs 02/21/2023 FINDINGS: Improvement in interstitial opacities from prior exam likely improving edema. Small left pleural effusion is similar. Stable heart size and mediastinal contours, TAVR. No new or developing airspace disease. No pneumothorax. IMPRESSION: 1. Improvement in interstitial opacities from prior exam likely improving edema. 2. Unchanged small left pleural effusion. Electronically Signed   By: Narda Rutherford M.D.   On: 02/25/2023 18:35   No results for input(s): "WBC", "HGB", "HCT", "PLT" in the last 72 hours.  No results for input(s): "NA", "K", "CL", "CO2", "GLUCOSE", "BUN", "CREATININE", "CALCIUM" in the last 72 hours.   Intake/Output Summary (Last 24 hours) at 02/26/2023 0944 Last data filed at 02/26/2023 0759 Gross per 24 hour  Intake 1040 ml  Output --  Net 1040 ml        Physical Exam: Vital Signs Blood pressure 95/63, pulse 72, temperature 98.2 F (36.8 C), temperature source Oral, resp. rate 19, height 5\' 8"  (1.727 m), weight 97.9 kg, SpO2 93%. Gen: no distress, normal appearing, BMI 32.82 Gen: no distress, normal appearing HEENT: oral mucosa pink and moist, NCAT Cardio: Reg rate Chest: normal effort, normal rate of breathing Abd: soft, non-distended Ext: no edema Psych: pleasant, normal affect, +anxiety Neuro: AAOx4. No apparent cognitive deficits  Skin: Groin catheterization sites well-healed, without apparent hematoma.  Clean, dry, intact.   GU: continent of bladder  Neurologic Exam:   DTRs: Reflexes were 2+ in bilateral  achilles, patella, biceps, BR and triceps. Babinsky: flexor responses b/l.   Hoffmans: negative b/l Sensory exam: revealed normal sensation in all dermatomal regions in bilateral upper extremities and bilateral lower extremities Motor exam: strength 5-/5 throughout bilateral upper extremities and bilateral lower extremities Coordination: Fine motor coordination was normal.  Mild bilateral upper extremity intention tremor   MSK: Tender to palpation over sternum and left chest, no bruising or apparent lesions  Assessment/Plan: 1. Functional deficits which require 3+ hours per day of interdisciplinary therapy in a comprehensive inpatient rehab setting. Physiatrist is providing close team supervision and 24 hour management of active medical problems listed below. Physiatrist and rehab team continue to assess barriers to discharge/monitor patient progress toward functional and medical goals  Care Tool:  Bathing    Body parts bathed by patient: Right arm, Left arm, Chest, Abdomen, Front perineal area, Buttocks, Right upper leg, Left upper leg, Face, Right lower leg, Left lower leg   Body parts bathed by helper: Right lower leg, Left lower leg     Bathing assist Assist Level: Supervision/Verbal cueing     Upper Body Dressing/Undressing Upper body dressing   What is the patient wearing?: Pull over shirt, Bra    Upper body assist Assist Level: Supervision/Verbal cueing    Lower Body Dressing/Undressing Lower body dressing      What is the patient wearing?: Underwear/pull up,  Pants     Lower body assist Assist for lower body dressing: Supervision/Verbal cueing     Toileting Toileting    Toileting assist Assist for toileting: Supervision/Verbal cueing     Transfers Chair/bed transfer  Transfers assist     Chair/bed transfer assist level: Independent with assistive device Chair/bed transfer assistive device: Other (rollator)   Locomotion Ambulation   Ambulation  assist      Assist level: Independent with assistive device Assistive device: Rollator Max distance: 118ft   Walk 10 feet activity   Assist     Assist level: Independent with assistive device Assistive device: Rollator   Walk 50 feet activity   Assist Walk 50 feet with 2 turns activity did not occur: Safety/medical concerns (fatigue, global weakness/deconditioning)  Assist level: Independent with assistive device Assistive device: Rollator    Walk 150 feet activity   Assist Walk 150 feet activity did not occur: Safety/medical concerns (fatigue, global weakness/deconditioning)  Assist level: Independent with assistive device Assistive device: Rollator    Walk 10 feet on uneven surface  activity   Assist Walk 10 feet on uneven surfaces activity did not occur: Safety/medical concerns (fatigue, global weakness/deconditioning)   Assist level: Supervision/Verbal cueing Assistive device: Rollator   Wheelchair     Assist Is the patient using a wheelchair?: No Type of Wheelchair: Manual Wheelchair activity did not occur: N/A         Wheelchair 50 feet with 2 turns activity    Assist    Wheelchair 50 feet with 2 turns activity did not occur: N/A       Wheelchair 150 feet activity     Assist  Wheelchair 150 feet activity did not occur: N/A       Blood pressure 95/63, pulse 72, temperature 98.2 F (36.8 C), temperature source Oral, resp. rate 19, height 5\' 8"  (1.727 m), weight 97.9 kg, SpO2 93%.  Medical Problem List and Plan: 1. 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  Current use of Antithrombotics/Antiplatelets: continue Eliquis 5 mg twice daily             -antiplatelet therapy: Aspirin and Plavix. D/c date for aspirin placed for 9/8 as per Dr. Nicki Guadalajara; discussed with patient   3. Pain: Tylenol, oxycodone 5 mg as needed             -continue Robaxin 500 mg BID             -continue gabapentin 300 mg TID             -continue Lidoderm patches 1-2 as needed for pain  -kpad ordered   4. Anxiety:             -continue Buspar 10 mg BID and  -  continue Xanax 3 mg BID and once daily  prn             -refusing Prozac 20 mg q HS>>will discontinue             -continue melatonin 3 mg q HS             -antipsychotic agents: n/a   5. Neuropsych/cognition: This patient is capable of making decisions on her own behalf.   6. Skin/Wound Care: Routine skin care checks   7. Fluids/Electrolytes/Nutrition: Routine Is and Os and follow-up chemistries             -hyponatremia>>improving, follow-up BMP   8: Hypertension: monitor TID and prn             -see meds #12   9: Hyperlipidemia: continue statin   10: CAD s/p PCI to RCA 2021; to LAD on 8/07: continue DAPT and statin   11: Aortic stenosis s/p TAVR 8/15: DAPT continues   12: Acute systolic CHF; EF ~96-04%; Lasix discontinued             -daily weight             -continue Farxiga 10 mg daily             -continue spironlactone 25 mg daily             -continue Entresto 49-51 BID (this dose started 8/26)   13: Urinary retention: most likely due to constipation and immobility             -Foley now out   14: Refractory VT resolved: continue amiodarone 200 mg BID   15: Carotid stenosis s/p right CEA 2020             -follow-up with [facility] as per protocol   16: Anemia; acute blood loss/other: follow-up CBC    17: Acute DVT left internal jugular>>resolved on recent venous duplex, superficial cephalic vein thrombosis present             -plan is to continue triple therapy and discontinue aspirin 30 days post-PCI              -continue Eliquis 5 mg BID   18: Hx partial vulvectomies with difficult Foley cath placement:  -urology consulted>>Foley placed; removed 8/27   19: Sepsis due to pneumonia>>Enterobacter isolated, antibiotics completed             -Incentive spirometry every 2 hours while awake, placed nursing order to request that this be placed within reach of patient at all times             -As needed oxygen, wean as tolerated   20: Anemia, multifactorial: Hgb reviewed and is stable, repeat Monday   21: Thrombocytosis: trending upward; follow-up CBC  22. Left pleural effusion: stable on CXR, lasix decreased to 10mg  given milkd AKI daily weights ordered  23. Shortness of breath: improved, increased oxygen to 2L, discussed goal O2 is 88-92%, improved, decreased lasix to 10mg   24. History of prediabetes: currently well controlled, d/c CBG checks, d/c Ensure between meals, discussed avoiding added sugar  25. Suboptimal vitamin D level: ergocalciferol 50,000U once per week ordered, continue  25. Obesity: d/c Ensure between meals, provided dietary education regarding whole food protein sources. Monitor daily weights  26. AKI: discussed that creatinine has normalized, discussed that AKI could have been caused by Lasix. Repeat creatinine on Monday  27. Hypotension: d/c lasix 10mg , discussed with patient, discussed with  Dr. Gala Romney and Sherlyn Lees, spironolactone, and amiodarone will also be decreased  I spent >22mins performing patient care related activities, including face to face time, documentation time, med management, discussion of meds and lab orders with patient, and overall coordination of care.    LOS: 8 days A FACE TO FACE EVALUATION WAS PERFORMED  Drema Pry Amorette Charrette 02/26/2023,  9:44 AM

## 2023-02-26 NOTE — Progress Notes (Addendum)
Patient ID: Madison West, female   DOB: [birth year], 67 y.o.   MRN: [id number]  Appeals information provided and explained to patient. Patient aware appeal cannot be placed until Monday 9/9 due to discharge orders. Sw made patient aware of financial responsibility if patient is denied appeal. Patient understands, agreeable and plans to place appeal on Monday 9/9. Pt expressed she does not feel as she will be able to be independent as of Monday 9/9. No additional questions or concerns.  Appeal Information provided: [facility]. Once patient starts appeal, SW will receive case number and provide clinicals. No additional questions or concerns.

## 2023-02-26 NOTE — Progress Notes (Shared)
Physical Therapy Discharge Summary  Patient Details  Name: Madison West MRN: [id number] Date of Birth: 1955/12/24  Date of Discharge from PT service:February 28, 2023  Patient has met 8 of 8 long term goals due to improved activity tolerance, improved balance, improved postural control, increased strength, ability to compensate for deficits, improved awareness, and improved coordination. Patient to discharge at an ambulatory level Modified Independent using rollator.     All goals met   Recommendation:  Patient will benefit from ongoing skilled PT services in outpatient setting to continue to advance safe functional mobility, address ongoing impairments in transfers, generalized strengthening and endurance, dynamic standing balance/coordination, gait training, and to minimize fall risk.  Equipment: rollator  Reasons for discharge: treatment goals met and discharge from hospital  Patient/family agrees with progress made and goals achieved: Yes  PT Discharge Precautions/Restrictions Precautions Precautions: Fall Precaution Comments: monitor O2 Restrictions Weight Bearing Restrictions: No Pain Interference Pain Interference Pain Effect on Sleep: 1. Rarely or not at all Pain Interference with Therapy Activities: 1. Rarely or not at all Pain Interference with Day-to-Day Activities: 1. 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 Intact Hot/Cold: Not tested Proprioception: Appears Intact Stereognosis: Not tested Coordination Gross Motor Movements are Fluid and Coordinated: Yes Fine Motor  Movements are Fluid and Coordinated: Yes Finger Nose Finger Test: [facility] bilaterally Heel Shin Test: [facility] bilaterally Motor  Motor Motor: Within Functional Limits  Mobility Bed Mobility Bed Mobility: Rolling Right;Rolling Left;Sit to Supine;Supine to Sit Rolling Right: Independent Rolling Left: Independent Supine to Sit: Independent Sit to Supine: Independent Transfers Transfers: Sit to Stand;Stand to Sit;Stand Pivot Transfers Sit to Stand: Independent with assistive device Stand to Sit: Independent with assistive device Stand Pivot Transfers: Independent with assistive device Transfer (Assistive device): Rollator Locomotion  Gait Ambulation: Yes Gait Assistance: Independent with assistive device Gait Distance (Feet): 150 Feet Assistive device: Rollator Gait Gait: Yes Gait Pattern: Impaired Gait Pattern: Poor foot clearance - right;Narrow base of support;Poor foot clearance - left;Decreased stride length;Decreased step length - right;Decreased step length - left;Decreased stance time - right Gait velocity: decreased Stairs / Additional Locomotion Stairs: Yes Stairs Assistance: Supervision/Verbal cueing Stair Management Technique: Two rails Number of Stairs: 12 Height of Stairs: 6 Ramp: Supervision/Verbal cueing (rollator) Pick up small object from the floor assist level: Supervision/Verbal cueing Pick up small object from the floor assistive device: object with rollator Wheelchair Mobility Wheelchair Mobility: No  Trunk/Postural Assessment  Cervical Assessment Cervical Assessment: Within Functional Limits Thoracic Assessment Thoracic Assessment: Within Functional Limits Lumbar Assessment Lumbar Assessment: Within Functional Limits Postural Control Postural Control: Within Functional Limits  Balance Balance Balance Assessed: Yes Static Sitting Balance Static Sitting - Balance Support: Feet supported;No upper extremity supported Static Sitting - Level of Assistance:  7: Independent Dynamic Sitting Balance Dynamic Sitting - Balance Support: Feet supported;No upper extremity supported Dynamic Sitting - Level of Assistance: 7: Independent Static Standing Balance Static Standing - Balance Support: Bilateral upper extremity supported;During functional activity (rollator) Static Standing - Level of Assistance: 6: Modified independent (Device/Increase time) Dynamic Standing Balance Dynamic Standing - Balance Support: Bilateral upper extremity supported;During functional activity (rollator) Dynamic Standing - Level of Assistance: 6: Modified independent (Device/Increase time) Dynamic Standing - Comments: transfers  and gait Extremity Assessment  RLE Assessment RLE Assessment: Exceptions to [facility] General Strength Comments: tested sitting EOB RLE Strength Right Hip Flexion: 4/5 Right Hip ABduction: 4/5 Right Hip ADduction: 4/5 Right Knee Flexion: 4/5 Right Knee Extension: 4/5 Right Ankle Dorsiflexion: 4-/5 Right Ankle Plantar Flexion: 4+/5 LLE Assessment LLE Assessment: Exceptions to [facility] General Strength Comments: tested sitting EOB LLE Strength Left Hip Flexion: 4/5 Left Hip ABduction: 4/5 Left Hip ADduction: 4/5 Left Knee Flexion: 4/5 Left Knee Extension: 4/5 Left Ankle Dorsiflexion: 4-/5 Left Ankle Plantar Flexion: 4+/5   Marlana Salvage Zaunegger Blima Rich PT, DPT 02/26/2023, 7:24 AM

## 2023-02-26 NOTE — Progress Notes (Signed)
Physical Therapy Session Note  Patient Details  Name: Madison West MRN: [id number] Date of Birth: May 30, 1956  Today's Date: 02/26/2023 PT Individual Time: 0731-0827 PT Individual Time Calculation (min): 56 min   Short Term Goals: Week 1:  PT Short Term Goal 1 (Week 1): STG=LTG due to LOS  Skilled Therapeutic Interventions/Progress Updates:   Received pt semi-reclined in bed, pt agreeable to PT treatment, and denied any pain during session. Session with emphasis on discharge planning, functional mobility/transfers, generalized strengthening and endurance, dynamic standing balance/coordination, simulated car transfers, stair navigation, and gait training. Pt performed bed mobility mod I with HOB elevated and use of bed features - pt states she sleeps in recliner at home.  Donned knee high teds and non-skid socks with supervision. Went through sensation, MMT, and pain interference questionnaire in preparation for discharge. Pt on RA with SPO2 80-98% throughout session but reported BP was low this morning - reassessed and 109/63 (pt asymptomatic). Pt required max cues throughout session for redirection and to stop talking and focus on pursed lip breathing - when pt stops talking her O2 increases quickly, however pt demonstrates very poor carry over with this and remains tangential. Pt sat EOB and brushed hair independently.  Pt performed all transfers with rollator and supervision throughout session. Pt ambulated 19ft with rollator and supervision/mod I to ortho gym. Pt performed ambulatory simulated car transfer with rollator and supervision. Pt then ambulated 51ft on uneven surfaces (ramp) with rollator and supervision. Pt able to pick up object from floor using rollator and supervision but reported increased dizziness when bending over - discussed purchasing reacher for home use to avoid excessive and unnecessary bending over. Pt practiced locking brakes and sitting on rollator to rest -  discussed energy conservation strategies to implement at home and when returning to work.  Pt ambulated additional 165ft with rollator and supervision/mod I to main therapy gym, took seated rest break, then navigated 12 6in steps with bilateral handrails and supervision alternating ascending and descending with a step to and step through pattern. Pt ambulated 126ft with rollator and supervision/mod I back to room and concluded session with pt sitting in recliner, needs within reach, and seatbelt alarm on.   Therapy Documentation Precautions:  Precautions Precautions: Fall Precaution Comments: monitor O2 sat Restrictions Weight Bearing Restrictions: No RLE Weight Bearing: Weight bearing as tolerated  Therapy/Group: Individual Therapy Marlana Salvage Zaunegger Blima Rich PT, DPT 02/26/2023, 6:54 AM

## 2023-02-26 NOTE — Progress Notes (Signed)
Physical Therapy Session Note  Patient Details  Name: Madison West MRN: [id number] Date of Birth: 1956-02-02  Today's Date: 02/26/2023 PT Individual Time: [id number] PT Individual Time Calculation (min): 28 min   Short Term Goals: Week 1:  PT Short Term Goal 1 (Week 1): STG=LTG due to LOS  Skilled Therapeutic Interventions/Progress Updates:     Pt performs functional transfers with rollator at modified independent level throughout session, locking brakes independently. Focused on self pacing and awareness for energy conservation during mobility tasks. Pt able to ambulate with rollator on unit > 150' with supervision to modified independent level, slow gait speed noted, self selected by patient. Fall risk assessment for Five Time Sit to Stand completed.   Five times Sit to Stand Test (FTSS) Method: Use a straight back chair with a solid seat that is 16-18" high. Ask participant to sit on the chair with arms folded across their chest.   Instructions: "Stand up and sit down as quickly as possible 5 times, keeping your arms folded across your chest."   Measurement: Stop timing when the participant stands the 5th time.  TIME: __27____ (in seconds)  Times > 13.6 seconds is associated with increased disability and morbidity (Guralnik, 2000) Times > 15 seconds is predictive of recurrent falls in healthy individuals aged 42 and older (Buatois, et al., 2008) Normal performance values in community dwelling individuals aged 21 and older (Bohannon, 2006): 60-69 years: 11.4 seconds 70-79 years: 12.6 seconds 80-89 years: 14.8 seconds  MCID: ? 2.3 seconds for Vestibular Disorders Wray Kearns, 2006)   Therapy Documentation Precautions:  Precautions Precautions: Fall Precaution Comments: monitor O2 Restrictions Weight Bearing Restrictions: No RLE Weight Bearing: Weight bearing as tolerated   Vital Signs: Room air O2 = 93% (initially 84% but within 10 sec up to 95%) HR = 83  bpm  Pain: No complaints.      Therapy/Group: Individual Therapy  Karolee Stamps Darrol Poke, PT, DPT, CBIS  02/26/2023, 9:32 AM

## 2023-02-26 NOTE — Plan of Care (Signed)
  Problem: RH KNOWLEDGE DEFICIT GENERAL Goal: RH STG INCREASE KNOWLEDGE OF SELF CARE AFTER HOSPITALIZATION Description: Manage self care with cues using educational resources independently 02/26/2023 1532 by Pierre Bali, RN Outcome: Not Progressing; patient is asking for extension of stay  02/26/2023 1305 by Pierre Bali, RN Outcome: Progressing

## 2023-02-26 NOTE — Progress Notes (Signed)
Speech Language Pathology Weekly Progress and Session Note  Patient Details  Name: Madison West MRN: [id number] Date of Birth: 1955-10-21  Beginning of progress report period: February 18, 2023 End of progress report period: February 26, 2023  Today's Date: 02/26/2023 SLP Individual Time: 1103-1205 SLP Individual Time Calculation (min): 62 min  Short Term Goals: Week 1: SLP Short Term Goal 1 (Week 1): Pt will complete moderately complex problem solving tasks with >80% accuracy given min cues. SLP Short Term Goal 1 - Progress (Week 1): Met SLP Short Term Goal 2 (Week 1): Pt will utilize compensatory memory strategies and aids to recall day to day information and complete memory exercises with >80% accuracy. SLP Short Term Goal 2 - Progress (Week 1): Met SLP Short Term Goal 3 (Week 1): Pt will complete medication management task with >90% accuracy given min cues. SLP Short Term Goal 3 - Progress (Week 1): Met SLP Short Term Goal 4 (Week 1): Pt will complete financial management tasks with >90% accuracy given min cues. SLP Short Term Goal 4 - Progress (Week 1): Met    New Short Term Goals: Week 2: SLP Short Term Goal 1 (Week 2): STGs=LTGs due to ELOS  Weekly Progress Updates: Patient has made functional gains and has met 4 of 4 STGs this reporting period. Currently, patient requires overall supervision level verbal cues to complete functional and mildly complex tasks safely in regards to problem solving and recall with use of compensatory strategies. Patient continues to be distracted by verbosity, however, suspect this is baseline that may be exacerbated by her anxiety. Patient and family education ongoing. Patient would benefit from continued skilled SLP intervention in order to maximize her cognitive functioning and overall functional independence prior to discharge.      Intensity: Minumum of 1-2 x/day, 30 to 90 minutes Frequency: 3 to 5 out of 7 days Duration/Length of Stay:  03/01/23 Treatment/Interventions: Cognitive remediation/compensation;Cueing hierarchy;Medication managment;Internal/external aids;Therapeutic Activities;Functional tasks   Daily Session  Skilled Therapeutic Interventions:  Skilled treatment session focused on cognitive goals. SLP facilitated session by providing education regarding patient's verbosity which can be distracting at times. Patient reports this is baseline but acknowledges it may be exacerbated due to anxiety. SLP provided support but also educated on the importance of the ability to self-monitor and redirect to functional tasks. She verbalized understanding and required overall Min verbal cues to self-monitor throughout session.  SLP also facilitated session by providing overall supervision level verbal cues for problem solving and organization during a complex scheduling task. Patient completed task in an overall timely manner. Patient reporting decreased visual acuity when reading with SLP providing reading glasses (2.0). At end of session, patient reported that she is hesitant regarding discharge on Monday due to decreased caregiver support. SLP provided education and informed CSW. Patient left upright in recliner with alarm on and all needs within reach. Continue with current plan of care.    Pain No/Denies Pain   Therapy/Group: Individual Therapy  Noor Witte 02/26/2023, 6:26 AM

## 2023-02-26 NOTE — Progress Notes (Signed)
Occupational Therapy Weekly Progress Note  Patient Details  Name: Madison West MRN: [id number] Date of Birth: April 15, 1956  Beginning of progress report period: February 19, 2023 End of progress report period: February 26, 2023  Today's Date: 02/26/2023 OT Individual Time: 5784-6962 OT Individual Time Calculation (min): 41 min    Patient has met  8 of 10 short term goals.     Patient continues to demonstrate the following deficits: muscle weakness and decreased cardiorespiratoy endurance and therefore will continue to benefit from skilled OT intervention to enhance overall performance with BADL and iADL.  Patient progressing toward long term goals..  Continue plan of care.  OT Short Term Goals Week 1:  OT Short Term Goal 1 (Week 1): STG = LTG 2/2 ELOS Week 2:   Continue with LTG's   Skilled Therapeutic Interventions/Progress Updates: Patient seen for evaluation of BADL's and progress towards LTG's and for education on IADL's for transition to home. Patient able to ambulate to kitchen with rollator for education on energy conservation with basic meal prep. Patient reports having made some changes to conserve energy due to back pain history. Patient will also have her children in the early morning and evening to assist. Patient expressed stress about being home alone. Discussed the importance of having mental health support and the possibility of joining a support group so she can discuss her health without feeling like she is burdening her children. Patient very motivated to return to [employer] [employer] [facility] and work, but aware of the importance of taking care of her health so that she can resume daily activities and hobbies. Continue with skilled OT POC to assist in meeting all LTG's and to prepare patient for discharge.      Therapy Documentation Precautions:  Precautions Precautions: Fall Precaution Comments: monitor O2 Restrictions Weight Bearing Restrictions: No RLE Weight Bearing: Weight bearing  as tolerated General:   Vital Signs: Therapy Vitals Temp: 98.2 F (36.8 C) Temp Source: Oral Pulse Rate: 72 BP: 95/63 Patient Position (if appropriate): Lying Oxygen Therapy SpO2: 93 % O2 Device: Room Air O2 Flow Rate (L/min): 1 L/min Patient Activity (if Appropriate): In bed Pain: Pain Assessment Pain Score: 6  ADL: ADL Eating: Set up Where Assessed-Eating: Chair Grooming: Setup Where Assessed-Grooming: Edge of bed Upper Body Bathing: Supervision/safety Where Assessed-Upper Body Bathing: Edge of bed Lower Body Bathing: Set up/ supervision assistance Where Assessed-Lower Body Bathing: Edge of bed Upper Body Dressing: Set up assistance Where Assessed-Upper Body Dressing: Edge of bed Lower Body Dressing: Stand by assistance Where Assessed-Lower Body Dressing: Other (Comment) (BSC) Toileting: Stand by assistance Where Assessed-Toileting: Bedside Commode Toilet Transfer: supervision assistance Toilet Transfer Method: Proofreader: Bedside commode Tub/Shower Transfer: Unable to assess Tub/Shower Transfer Method: Unable to assess Psychologist, counselling Transfer: Unable to assess Film/video editor Method: Unable to assess     Therapy/Group: Individual Therapy  Warnell Forester 02/26/2023, 12:52 PM

## 2023-02-27 DIAGNOSIS — R0602 Shortness of breath: Secondary | ICD-10-CM

## 2023-02-27 DIAGNOSIS — I959 Hypotension, unspecified: Secondary | ICD-10-CM

## 2023-02-27 DIAGNOSIS — I5022 Chronic systolic (congestive) heart failure: Secondary | ICD-10-CM

## 2023-02-27 DIAGNOSIS — G4700 Insomnia, unspecified: Secondary | ICD-10-CM

## 2023-02-27 NOTE — Progress Notes (Signed)
PROGRESS NOTE   Subjective/Complaints: No acute events noted overnight.  She reports she did not sleep too well last night.  She does not want any medication changes for sleep.  She feels like the melatonin is helping a little bit.  She still very anxious about going home on Monday and would like to stay till Friday.  ROS: shortness of breath resolved, +anxiety, denies chest pain   Objective:   DG Chest 2 View  Result Date: 02/25/2023 CLINICAL DATA:  Follow-up pulmonary edema. EXAM: CHEST - 2 VIEW COMPARISON:  Radiographs 02/21/2023 FINDINGS: Improvement in interstitial opacities from prior exam likely improving edema. Small left pleural effusion is similar. Stable heart size and mediastinal contours, TAVR. No new or developing airspace disease. No pneumothorax. IMPRESSION: 1. Improvement in interstitial opacities from prior exam likely improving edema. 2. Unchanged small left pleural effusion. Electronically Signed   By: Narda Rutherford M.D.   On: 02/25/2023 18:35   No results for input(s): "WBC", "HGB", "HCT", "PLT" in the last 72 hours.  No results for input(s): "NA", "K", "CL", "CO2", "GLUCOSE", "BUN", "CREATININE", "CALCIUM" in the last 72 hours.   Intake/Output Summary (Last 24 hours) at 02/27/2023 1437 Last data filed at 02/27/2023 1320 Gross per 24 hour  Intake 1080 ml  Output 800 ml  Net 280 ml        Physical Exam: Vital Signs Blood pressure 97/63, pulse 81, temperature 98.5 F (36.9 C), resp. rate 16, height 5\' 8"  (1.727 m), weight 99.6 kg, SpO2 91%. Gen: no distress, normal appearing, BMI 32.82 HEENT: oral mucosa pink and moist, NCAT Cardio: Reg rate Chest: CTAB, normal effort, normal rate of breathing on room air Abd: soft, non-distended Ext: no edema Psych: Pleasant, anxious Neuro: AAOx4. No apparent cognitive deficits  Skin: Groin catheterization sites well-healed, without apparent hematoma.  Clean, dry,  intact.   GU: continent of bladder  Neurologic Exam:   DTRs: Reflexes were 2+ in bilateral achilles, patella, biceps, BR and triceps. Babinsky: flexor responses b/l.   Hoffmans: negative b/l Sensory exam: revealed normal sensation in all dermatomal regions in bilateral upper extremities and bilateral lower extremities Motor exam: strength 5-/5 throughout bilateral upper extremities and bilateral lower extremities Coordination: Fine motor coordination was normal.  Mild bilateral upper extremity intention tremor   MSK: Tender to palpation over sternum and left chest, no bruising or apparent lesions  Assessment/Plan: 1. Functional deficits which require 3+ hours per day of interdisciplinary therapy in a comprehensive inpatient rehab setting. Physiatrist is providing close team supervision and 24 hour management of active medical problems listed below. Physiatrist and rehab team continue to assess barriers to discharge/monitor patient progress toward functional and medical goals  Care Tool:  Bathing    Body parts bathed by patient: Right arm, Left arm, Chest, Abdomen, Front perineal area, Buttocks, Right upper leg, Left upper leg, Face, Right lower leg, Left lower leg   Body parts bathed by helper: Right lower leg, Left lower leg     Bathing assist Assist Level: Supervision/Verbal cueing     Upper Body Dressing/Undressing Upper body dressing   What is the patient wearing?: Pull over shirt, Bra    Upper body  assist Assist Level: Supervision/Verbal cueing    Lower Body Dressing/Undressing Lower body dressing      What is the patient wearing?: Underwear/pull up, Pants     Lower body assist Assist for lower body dressing: Supervision/Verbal cueing     Toileting Toileting    Toileting assist Assist for toileting: Supervision/Verbal cueing     Transfers Chair/bed transfer  Transfers assist     Chair/bed transfer assist level: Independent with assistive  device Chair/bed transfer assistive device: Other (rollator)   Locomotion Ambulation   Ambulation assist      Assist level: Independent with assistive device Assistive device: Rollator Max distance: 172ft   Walk 10 feet activity   Assist     Assist level: Independent with assistive device Assistive device: Rollator   Walk 50 feet activity   Assist Walk 50 feet with 2 turns activity did not occur: Safety/medical concerns (fatigue, global weakness/deconditioning)  Assist level: Independent with assistive device Assistive device: Rollator    Walk 150 feet activity   Assist Walk 150 feet activity did not occur: Safety/medical concerns (fatigue, global weakness/deconditioning)  Assist level: Independent with assistive device Assistive device: Rollator    Walk 10 feet on uneven surface  activity   Assist Walk 10 feet on uneven surfaces activity did not occur: Safety/medical concerns (fatigue, global weakness/deconditioning)   Assist level: Supervision/Verbal cueing Assistive device: Rollator   Wheelchair     Assist Is the patient using a wheelchair?: No Type of Wheelchair: Manual Wheelchair activity did not occur: N/A         Wheelchair 50 feet with 2 turns activity    Assist    Wheelchair 50 feet with 2 turns activity did not occur: N/A       Wheelchair 150 feet activity     Assist  Wheelchair 150 feet activity did not occur: N/A       Blood pressure 97/63, pulse 81, temperature 98.5 F (36.9 C), resp. rate 16, height 5\' 8"  (1.727 m), weight 99.6 kg, SpO2 91%.  Medical Problem List and Plan: 1. 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  Current use of Antithrombotics/Antiplatelets: continue Eliquis 5 mg twice daily             -antiplatelet therapy: Aspirin and Plavix. D/c date for aspirin placed for 9/8 as per Dr. Nicki Guadalajara; discussed with patient   3. Pain: Tylenol, oxycodone 5 mg as needed             -continue Robaxin 500 mg BID             -continue gabapentin 300 mg TID             -continue Lidoderm  patches 1-2 as needed for pain  -kpad ordered   4. Anxiety:             -continue Buspar 10 mg BID and  -continue Xanax 3 mg BID and once daily  prn             -refusing Prozac 20 mg q HS>>will discontinue             -continue melatonin 3 mg q HS             -antipsychotic agents: n/a   5. Neuropsych/cognition: This patient is capable of making decisions on her own behalf.   6. Skin/Wound Care: Routine skin care checks   7. Fluids/Electrolytes/Nutrition: Routine Is and Os and follow-up chemistries             -hyponatremia>>improving, follow-up BMP   8: Hypertension: monitor TID and prn             -see meds #12   9: Hyperlipidemia: continue statin   10: CAD s/p PCI to RCA 2021; to LAD on 8/07: continue DAPT and statin   11: Aortic stenosis s/p TAVR 8/15: DAPT continues   12: Acute systolic CHF; EF ~16-10%; Lasix discontinued             -daily weight             -continue Farxiga 10 mg daily             -continue spironlactone 25 mg daily             -continue Entresto 49-51 BID (this dose started 8/26)    -9/7 weight appears overall stable from prior, weight was 100.1 kg on 9/4.  Continue to monitor  Filed Weights   02/26/23 0543 02/26/23 1059 02/27/23 0500  Weight: 97.9 kg  97.6 kg 99.6 kg      13: Urinary retention: most likely due to constipation and immobility             -Foley now out   14: Refractory VT resolved: continue amiodarone 200 mg BID   15: Carotid stenosis s/p right CEA 2020             -follow-up with [facility] as per protocol   16: Anemia; acute blood loss/other: follow-up CBC   17: Acute DVT left internal jugular>>resolved on recent venous duplex, superficial cephalic vein thrombosis present             -plan is to continue triple therapy and discontinue aspirin 30 days post-PCI              -continue Eliquis 5 mg BID   18: Hx partial vulvectomies with difficult Foley cath placement:  -urology consulted>>Foley placed; removed 8/27   19: Sepsis due to pneumonia>>Enterobacter isolated, antibiotics completed             -Incentive spirometry every 2 hours while awake, placed nursing order to request that this be placed within reach of patient at all times             -As needed oxygen, wean as tolerated   20: Anemia, multifactorial: Hgb reviewed and is stable, repeat Monday   21: Thrombocytosis: trending upward; follow-up CBC  22. Left pleural effusion: stable on CXR, lasix decreased to 10mg  given milkd AKI daily weights ordered  23. Shortness of breath: improved, increased oxygen to 2L, discussed goal O2 is 88-92%, improved, decreased lasix to 10mg   -9/7 patient had brief decrease of O2  sat to 86 with activity, improved to low 90s shortly after.  O2 nasal cannula as needed started.  24. History of prediabetes: currently well controlled, d/c CBG checks, d/c Ensure between meals, discussed avoiding added sugar  25. Suboptimal vitamin D level: ergocalciferol 50,000U once per week ordered, continue  25. Obesity: d/c Ensure between meals, provided dietary education regarding whole food protein sources. Monitor daily weights  26. AKI: discussed that creatinine has normalized, discussed that AKI could have been caused by Lasix. Repeat  creatinine on Monday  27. Hypotension: d/c lasix 10mg , discussed with patient, discussed with Dr. Gala Romney and Sherlyn Lees, spironolactone, and amiodarone will also be decreased  -Continue to monitor blood pressure, stable    02/27/2023    1:37 PM 02/27/2023    5:28 AM 02/27/2023    5:00 AM  Vitals with BMI  Weight   219 lbs 9 oz  BMI   33.39  Systolic 97 115   Diastolic 63 68   Pulse 81 69    28.  Insomnia  -Continue melatonin, she declines medication change at this time   LOS: 9 days A FACE TO FACE EVALUATION WAS PERFORMED  Fanny Dance 02/27/2023, 2:37 PM

## 2023-02-27 NOTE — Progress Notes (Addendum)
Vitals obtained at 2000. During that time patient SPO2 percentage was fluctuating between 79 to 80. Able to do breathing exercises with patient and using the pillow to brace her upper chest area to work on deeper inhalation and less shallow breathing pattern. SPO2 able to briefly go up to 84% quickly back down to 80%. Talked about the incentive spirometer has been using it today and is in patient's lap at this time. Explains able to reach 1500 with the spirometer. Per orders PRN for 2 L [place] of O2. Patient agreeable to having O2 on at this time. Explained would return to reassess vitals.

## 2023-02-27 NOTE — Plan of Care (Signed)
Goals downgraded due to inconsistent progress  Problem: RH Memory Goal: LTG Patient will use memory compensatory aids to (SLP) Description: LTG:  Patient will use memory compensatory aids to recall biographical/new, daily complex information with cues (SLP) Flowsheets (Taken 02/27/2023 0704) LTG: Patient will use memory compensatory aids to (SLP): Supervision   Problem: RH Attention Goal: LTG Patient will demonstrate this level of attention during functional activites (SLP) Description: LTG:  Patient will will demonstrate this level of attention during functional activities with modified independence (SLP) Flowsheets (Taken 02/27/2023 0704) LTG: Patient will demonstrate this level of attention during cognitive/linguistic activities with assistance of (SLP): Minimal Assistance - Patient > 75%

## 2023-02-28 ENCOUNTER — Inpatient Hospital Stay (HOSPITAL_COMMUNITY): Payer: Medicare Other

## 2023-02-28 MED ORDER — FUROSEMIDE 20 MG PO TABS
20.0000 mg | ORAL_TABLET | Freq: Every day | ORAL | Status: DC
  Administered 2023-03-01 – 2023-03-04 (×4): 20 mg via ORAL
  Filled 2023-02-28 (×4): qty 1

## 2023-02-28 NOTE — Plan of Care (Signed)
  Problem: RH BOWEL ELIMINATION Goal: RH STG MANAGE BOWEL W/MEDICATION W/ASSISTANCE Description: STG Manage Bowel with Medication with mod I Assistance. Outcome: Progressing   Problem: RH SAFETY Goal: RH STG ADHERE TO SAFETY PRECAUTIONS W/ASSISTANCE/DEVICE Description: STG Adhere to Safety Precautions With cues Assistance/Device. Outcome: Progressing   Problem: RH KNOWLEDGE DEFICIT GENERAL Goal: RH STG INCREASE KNOWLEDGE OF SELF CARE AFTER HOSPITALIZATION Description: Manage self care with cues using educational resources independently Outcome: Progressing   Problem: RH KNOWLEDGE DEFICIT Goal: RH STG INCREASE KNOWLEDGE OF HYPERTENSION Description: Patient and spouse will be able to manage HTN with medications and dietary modifications using educational resources independently Outcome: Progressing   Problem: Fluid Volume: Goal: Ability to achieve a balanced intake and output will improve Outcome: Progressing   Problem: Physical Regulation: Goal: Complications related to the disease process, condition or treatment will be avoided or minimized Outcome: Progressing

## 2023-02-28 NOTE — Progress Notes (Addendum)
PROGRESS NOTE   Subjective/Complaints: She has required intermittent oxygen nasal cannula.  She still very anxious about going home.  ROS: shortness of breath resolved, +anxiety, denies chest pain, new motor or sensory changes   Objective:   No results found. No results for input(s): "WBC", "HGB", "HCT", "PLT" in the last 72 hours.  No results for input(s): "NA", "K", "CL", "CO2", "GLUCOSE", "BUN", "CREATININE", "CALCIUM" in the last 72 hours.   Intake/Output Summary (Last 24 hours) at 02/28/2023 1444 Last data filed at 02/28/2023 1112 Gross per 24 hour  Intake 1200 ml  Output 1350 ml  Net -150 ml        Physical Exam: Vital Signs Blood pressure 104/64, pulse 72, temperature 98.5 F (36.9 C), temperature source Oral, resp. rate 18, height 5\' 8"  (1.727 m), weight 99.6 kg, SpO2 94%. Gen: no distress, normal appearing, BMI 32.82 HEENT: oral mucosa pink and moist, NCAT Cardio: RRR Chest: CTAB, normal effort, normal rate of breathing on room air  Abd: soft, non-distended, +BS Ext: no edema Psych: Pleasant, anxious Neuro: AAOx4. No apparent cognitive deficits  Skin: Groin catheterization sites well-healed, without apparent hematoma.  Clean, dry, intact.   GU: continent of bladder  Neurologic Exam:   DTRs: Reflexes were 2+ in bilateral achilles, patella, biceps, BR and triceps. Babinsky: flexor responses b/l.   Hoffmans: negative b/l Sensory exam: revealed normal sensation in all dermatomal regions in bilateral upper extremities and bilateral lower extremities Motor exam: strength 5-/5 throughout bilateral upper extremities and bilateral lower extremities Coordination: Fine motor coordination was normal.  Mild bilateral upper extremity intention tremor   MSK: Tender to palpation over sternum and left chest, no bruising or apparent lesions  Assessment/Plan: 1. Functional deficits which require 3+ hours per day of  interdisciplinary therapy in a comprehensive inpatient rehab setting. Physiatrist is providing close team supervision and 24 hour management of active medical problems listed below. Physiatrist and rehab team continue to assess barriers to discharge/monitor patient progress toward functional and medical goals  Care Tool:  Bathing    Body parts bathed by patient: Right arm, Left arm, Chest, Abdomen, Front perineal area, Buttocks, Right upper leg, Left upper leg, Face, Right lower leg, Left lower leg   Body parts bathed by helper: Right lower leg, Left lower leg     Bathing assist Assist Level: Supervision/Verbal cueing     Upper Body Dressing/Undressing Upper body dressing   What is the patient wearing?: Pull over shirt, Bra    Upper body assist Assist Level: Supervision/Verbal cueing    Lower Body Dressing/Undressing Lower body dressing      What is the patient wearing?: Underwear/pull up, Pants     Lower body assist Assist for lower body dressing: Supervision/Verbal cueing     Toileting Toileting    Toileting assist Assist for toileting: Supervision/Verbal cueing     Transfers Chair/bed transfer  Transfers assist     Chair/bed transfer assist level: Independent with assistive device Chair/bed transfer assistive device: Other (rollator)   Locomotion Ambulation   Ambulation assist      Assist level: Independent with assistive device Assistive device: Rollator Max distance: 137ft   Walk 10 feet activity  Assist     Assist level: Independent with assistive device Assistive device: Rollator   Walk 50 feet activity   Assist Walk 50 feet with 2 turns activity did not occur: Safety/medical concerns (fatigue, global weakness/deconditioning)  Assist level: Independent with assistive device Assistive device: Rollator    Walk 150 feet activity   Assist Walk 150 feet activity did not occur: Safety/medical concerns (fatigue, global  weakness/deconditioning)  Assist level: Independent with assistive device Assistive device: Rollator    Walk 10 feet on uneven surface  activity   Assist Walk 10 feet on uneven surfaces activity did not occur: Safety/medical concerns (fatigue, global weakness/deconditioning)   Assist level: Supervision/Verbal cueing Assistive device: Rollator   Wheelchair     Assist Is the patient using a wheelchair?: No Type of Wheelchair: Manual Wheelchair activity did not occur: N/A         Wheelchair 50 feet with 2 turns activity    Assist    Wheelchair 50 feet with 2 turns activity did not occur: N/A       Wheelchair 150 feet activity     Assist  Wheelchair 150 feet activity did not occur: N/A       Blood pressure 104/64, pulse 72, temperature 98.5 F (36.9 C), temperature source Oral, resp. rate 18, height 5\' 8"  (1.727 m), weight 99.6 kg, SpO2 94%.  Medical Problem List and Plan: 1. 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  Current use of Antithrombotics/Antiplatelets: continue Eliquis 5 mg twice daily             -antiplatelet therapy: Aspirin and Plavix. D/c date for aspirin placed for 9/8 as per Dr. Nicki Guadalajara; discussed with patient   3. Pain: Tylenol, oxycodone 5 mg as  needed             -continue Robaxin 500 mg BID             -continue gabapentin 300 mg TID             -continue Lidoderm patches 1-2 as needed for pain  -kpad ordered   4. Anxiety:             -continue Buspar 10 mg BID and  -continue Xanax 3 mg BID and once daily  prn             -refusing Prozac 20 mg q HS>>will discontinue             -continue melatonin 3 mg q HS             -antipsychotic agents: n/a   5. Neuropsych/cognition: This patient  is capable of making decisions on her own behalf.   6. Skin/Wound Care: Routine skin care checks   7. Fluids/Electrolytes/Nutrition: Routine Is and Os and follow-up chemistries             -hyponatremia>>improving, follow-up BMP   8: Hypertension: monitor TID and prn             -see meds #12   9: Hyperlipidemia: continue statin   10: CAD s/p PCI to RCA 2021; to LAD on 8/07: continue DAPT and statin   11: Aortic stenosis s/p TAVR 8/15: DAPT continues   12: Acute systolic CHF; EF ~29-56%; Lasix discontinued             -daily weight             -continue Farxiga 10 mg daily             -continue spironlactone 25 mg daily             -continue Entresto 49-51 BID (this dose started 8/26)    -9/7-8 weight appears overall stable from prior, weight was 100.1 kg on 9/4.  Continue to monitor  -Recheck CXR  Filed Weights   02/26/23 0543 02/26/23 1059 02/27/23 0500  Weight: 97.9 kg 97.6 kg 99.6 kg      13: Urinary retention: most likely due to constipation and immobility             -Foley now out   14: Refractory VT resolved: continue amiodarone 200 mg BID   15: Carotid stenosis s/p right CEA 2020             -follow-up with [facility] as per protocol   16: Anemia; acute blood loss/other: follow-up CBC   17: Acute DVT left internal jugular>>resolved on recent venous duplex, superficial cephalic vein thrombosis present             -plan is to continue triple therapy and discontinue aspirin 30 days post-PCI              -continue  Eliquis 5 mg BID   18: Hx partial vulvectomies with difficult Foley cath placement:  -urology consulted>>Foley placed; removed 8/27   19: Sepsis due to pneumonia>>Enterobacter isolated, antibiotics completed             -Incentive spirometry every 2 hours while awake, placed nursing order to request that this be placed within reach of patient at all times             -As needed oxygen, wean as tolerated   20: Anemia, multifactorial: Hgb reviewed and is stable, repeat Monday   21: Thrombocytosis: trending upward; follow-up CBC  22. Left pleural effusion: stable on CXR, lasix decreased to 10mg  given milkd AKI daily weights ordered  23. Shortness of breath: improved, increased oxygen to 2L, discussed goal O2 is 88-92%, improved, decreased lasix to 10mg   -9/7 patient had brief decrease of O2 sat to 86 with activity, improved to low 90s shortly after.  O2 nasal cannula as needed started.  -Recheck CXR  24. History of prediabetes: currently well controlled, d/c CBG checks, d/c Ensure between meals, discussed avoiding added sugar  25. Suboptimal vitamin D level: ergocalciferol 50,000U once per week ordered, continue  25. Obesity: d/c Ensure between meals, provided dietary education regarding whole food protein sources. Monitor daily weights  26. AKI: discussed that creatinine has normalized, discussed that AKI could have been caused by Lasix. Repeat creatinine on Monday  27. Hypotension: d/c lasix  10mg , discussed with patient, discussed with Dr. Gala Romney and Sherlyn Lees, spironolactone, and amiodarone will also be decreased  -BP intermittently soft, continue to monitor    02/28/2023    2:34 PM 02/28/2023    5:29 AM 02/28/2023    1:30 AM  Vitals with BMI  Systolic 104 100 90  Diastolic 64 64 58  Pulse 72 66 67   28.  Insomnia  -Continue melatonin, she declines medication change at this time  Addendum  CXR  IMPRESSION: 1. Mixed pattern pulmonary edema. 2. Calcific atherosclerotic disease  of the aorta.  Will restart lasix 20mg  PO daily  LOS: 10 days A FACE TO FACE EVALUATION WAS PERFORMED  Fanny Dance 02/28/2023, 2:44 PM

## 2023-02-28 NOTE — Progress Notes (Signed)
Occupational Therapy Session Note  Patient Details  Name: Madison West MRN: [id number] Date of Birth: 1955/11/21  Today's Date: 02/28/2023 OT Individual Time: 1010-1100 OT Individual Time Calculation (min): 50 min    Short Term Goals: Week 1:  OT Short Term Goal 1 (Week 1): STG = LTG 2/2 ELOS  Skilled Therapeutic Interventions/Progress Updates:    Patient seated in the recliner at the time of arrival.  Nasal canual not in place, pt indicated that she would like to take a shower. O2 sats were at 91% on room air. Patient presents with non productive cough 02 reading at 84.  O2 canual place on  with 2L  with O2 reading at 96% with canual in place.  The 02 was removed after 5  minutes and the pt registered at 94%. The pt ambulated around the room to retrieve clothing items using the Rolator.  The pt entered the restroom and ambulated to the shower using the Rolator at [facility].  The pt was able to enter the shower using the grab bars for additional balance with CGA. The pt was informed of the importance of no getting the temperature of the water to hot. The pt was able to lower herself onto the shower seat and bathe UB/LB with close S. The pt was able to exit the shower using the grab bar and her Rolator for additional balance with CGA. The pt ambulated to a chair and was able to dress her UB with s/u assist, she completed her  LB with s/u assist for her underwear and shorts  and MinA for donning her non-skid socks.  After donning her LB garments,  the pt  exited the restroom using her Rolator  with CGA and  sat in her  recliner with all additional needs addressed prior to me exiting the room. The pt presented with a 02 reading of 87% and the nasal canula put in place with a read of 100% prior to me exiting the room.  Therapy Documentation Precautions:  Precautions Precautions: Fall Precaution Comments: monitor O2 Restrictions Weight Bearing Restrictions: No RLE Weight Bearing: Weight bearing as  tolerated  Therapy/Group: Individual Therapy  Lavona Mound 02/28/2023, 12:33 PM

## 2023-02-28 NOTE — Progress Notes (Signed)
Physical Therapy Session Note  Patient Details  Name: Madison West MRN: [id number] Date of Birth: [birth year]  Today's Date: 02/28/2023 PT Individual Time: 1305-1420 PT Individual Time Calculation (min): 75 min   Short Term Goals: Week 1:  PT Short Term Goal 1 (Week 1): STG=LTG due to LOS  Skilled Therapeutic Interventions/Progress Updates: Pt presented in recliner with friends present agreeable to therapy. Pt denies pain during session. Pt provided with rollator and ambulated mod I to ortho gym. SpO2 checked 84% on RA, discussed continued need for supplemental O2 as pt frustrated that O2 stats are not maintaining with activity. 0.5L supplemental O2 provided  with improvement to 93%. Pt then completed car transfer, ambulation on ramp, then ambulation to main gym with SpO2 checked once in gym with SpO2 84% but resolved to 90% within 30 sec. Pt then completed ascending/descending stairs with B rails and supervision with SpO2 84% after activity but again resolving to >90% within 30 sec. Pt then ambulated to day room with rollator and supervision. Pt set up with NuStep and completed NuStep L2 x 3 min, x 5 min, and x 5 min for increased cardiovascular activity. Pt was able to maintain SpO2 >90% after first  two bouts then dropped to 86% with recovery to 90% within 15 sec on third bout.       Therapy Documentation Precautions:  Precautions Precautions: Fall Precaution Comments: monitor O2 Restrictions Weight Bearing Restrictions: No RLE Weight Bearing: Weight bearing as tolerated General:   Vital Signs: Therapy Vitals Temp: 98.5 F (36.9 C) Temp Source: Oral Pulse Rate: 72 Resp: 18 BP: 104/64 Patient Position (if appropriate): Lying Oxygen Therapy SpO2: 94 % O2 Device: Nasal Cannula Pain: Pain Assessment Pain Scale: 0-10 Pain Score: 0-No pain  Therapy/Group: Individual Therapy  Saajan Willmon 02/28/2023, 3:52 PM

## 2023-02-28 NOTE — Plan of Care (Signed)
  Problem: Consults Goal: RH GENERAL PATIENT EDUCATION Description: See Patient Education module for education specifics. Outcome: Progressing   Problem: RH BOWEL ELIMINATION Goal: RH STG MANAGE BOWEL WITH ASSISTANCE Description: STG Manage Bowel with mod I Assistance. Outcome: Progressing Goal: RH STG MANAGE BOWEL W/MEDICATION W/ASSISTANCE Description: STG Manage Bowel with Medication with mod I Assistance. Outcome: Progressing

## 2023-02-28 NOTE — Progress Notes (Signed)
Speech Language Pathology Daily Session Note  Patient Details  Name: Madison West MRN: [id number] Date of Birth: 1955-12-25  Today's Date: 02/28/2023 SLP Individual Time: 0805-0900 SLP Individual Time Calculation (min): 55 min  Short Term Goals: Week 2: SLP Short Term Goal 1 (Week 2): STGs=LTGs due to ELOS  Skilled Therapeutic Interventions:  Pt was seen for skilled ST targeting cognitive goal.  Pt seated upright in wheelchair upon therapist's arrival with questions about supplemental oxygen needs.  SLP reviewed orders to maintain O2 sats above 88.  Pt consistently maintained O2 sats >90 without supplemental oxygen for the remainder of today's therapy session but verbalized feeling anxious about her readings being in the mid to lower 90s versus in the high 90s.  Discussed with nursing and nasal cannula was left within pt's reach for her peace of mind should she become symptomatic.  SLP facilitated the session with a novel card game targeting use of memory compensatory strategies.  Pt created and recalled word-picture associations with min assist verbal cues.  Pt was then able to name and recall 100% of specific category members named within task after delays of varying lengths with min assist.  SLP reviewed and reinforced WRAP strategies with pt.  All questions were answered to her satisfaction at this time.  Pt was left in chair with chair alarm set and all needs within reach.  Continue per current plan of care.    Pain Pain Assessment Pain Scale: 0-10 Pain Score: 0-No pain  Therapy/Group: Individual Therapy  Durelle Zepeda, Melanee Spry 02/28/2023, 12:43 PM

## 2023-03-01 LAB — BASIC METABOLIC PANEL
Anion gap: 9 (ref 5–15)
BUN: 14 mg/dL (ref 8–23)
CO2: 27 mmol/L (ref 22–32)
Calcium: 8.9 mg/dL (ref 8.9–10.3)
Chloride: 99 mmol/L (ref 98–111)
Creatinine, Ser: 0.94 mg/dL (ref 0.44–1.00)
GFR, Estimated: >60 mL/min (ref >60)
Glucose, Bld: 108 mg/dL — ABNORMAL HIGH (ref 70–99)
Potassium: 4.2 mmol/L (ref 3.5–5.1)
Sodium: 135 mmol/L (ref 135–145)

## 2023-03-01 LAB — CBC
HCT: 30.4 % — ABNORMAL LOW (ref 36.0–46.0)
Hemoglobin: 9.6 g/dL — ABNORMAL LOW (ref 12.0–15.0)
MCH: 30.6 pg (ref 26.0–34.0)
MCHC: 31.6 g/dL (ref 30.0–36.0)
MCV: 96.8 fL (ref 80.0–100.0)
Platelets: 338 10*3/uL (ref 150–400)
RBC: 3.14 MIL/uL — ABNORMAL LOW (ref 3.87–5.11)
RDW: 15.9 % — ABNORMAL HIGH (ref 11.5–15.5)
WBC: 4.7 10*3/uL (ref 4.0–10.5)
nRBC: 0 % (ref 0.0–0.2)

## 2023-03-01 NOTE — Progress Notes (Signed)
Speech Language Pathology Daily Session Note  Patient Details  Name: Madison West MRN: [id number] Date of Birth: 05-03-1956  Today's Date: 03/01/2023 SLP Individual Time: 1400-1449 SLP Individual Time Calculation (min): 49 min  Short Term Goals: Week 2: SLP Short Term Goal 1 (Week 2): STGs=LTGs due to ELOS  Skilled Therapeutic Interventions:  Pt was seen in PM to address cognitive re- training. Pt was alert and seated upright in recliner upon SLP arrival. Pt able to verbalize recent medical hx and PLOF with circumstantial speech. She was easily redirected to speech task and able to acknowledge verbosity and some need for redirection. 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 Pt completed task with 90% acc indep improving to 100% with mod I. SLP further addressed prob solving within the home. Given scenarios presented verbally, she identified solutions with 100% acc. Pt was left seated upright in recliner with call button within reach. SLP to continue POC.   Pain Pain Assessment Pain Scale: 0-10 Pain Score: 4  Pain Location: Sternum Pain Descriptors / Indicators: Aching  Therapy/Group: Individual Therapy  Renaee Munda 03/01/2023, 2:55 PM

## 2023-03-01 NOTE — Progress Notes (Signed)
Patient ID: Madison West, female   DOB: February 14, 1956, 67 y.o.   MRN: [id number]   Patient informed SW appeal has been placed. SW will wait on fax with case number and then submit clinicals in reference to case. No additional questions or concerns.

## 2023-03-01 NOTE — Progress Notes (Signed)
Inpatient Rehabilitation Discharge Medication Review by a Pharmacist  A complete drug regimen review was completed for this patient to identify any potential clinically significant medication issues.  High Risk Drug Classes Is patient taking? Indication by Medication  Antipsychotic No   Anticoagulant Yes Apixaban - DVT  Antibiotic No   Opioid Yes  Oxycodone PRN- acute pain   Antiplatelet Yes Plavix - CAD  Hypoglycemics/insulin Yes Jardiance - CHF  Vasoactive Medication Yes Amiodarone/Aldactone/Entresto/LSX - HTN/CHF  Chemotherapy No   Other Yes Xanax/Buspar- anxiety Neurontin - neuropathy Robaxin - spasms Crestor - HLD NTG PRN- CAD      Type of Medication Issue Identified Description of Issue Recommendation(s)  Drug Interaction(s) (clinically significant)     Duplicate Therapy     Allergy     No Medication Administration End Date     Incorrect Dose     Additional Drug Therapy Needed     Significant med changes from prior encounter (inform family/care partners about these prior to discharge).    Other  CoQ10, fish oil, garlic, turmeric, vitamin D, zinc  Prozac discontinued  Resume at discharge if needed    Clinically significant medication issues were identified that warrant physician communication and completion of prescribed/recommended actions by midnight of the next day:  No  Name of provider notified for urgent issues identified:   Provider Method of Notification:     Pharmacist comments:   Time spent performing this drug regimen review (minutes):  20   Jani Gravel, PharmD Clinical Pharmacist  03/05/2023 9:29 AM

## 2023-03-01 NOTE — Progress Notes (Addendum)
PROGRESS NOTE   Subjective/Complaints: Feels a little dizzy this morning Denies shortness of breath at rest but does have some with exertion Plans to appeal discharge  ROS: shortness of breath resolved at rest- still present with exertion/while talking, +anxiety, denies chest pain, new motor or sensory changes   Objective:   DG Chest 2 View  Result Date: 02/28/2023 CLINICAL DATA:  Shortness of breath. EXAM: CHEST - 2 VIEW COMPARISON:  None Available. FINDINGS: Calcific atherosclerotic disease of the aorta. Cardiomediastinal silhouette is normal. Mediastinal contours appear intact. Mixed pattern pulmonary edema.  No definite pleural effusions. Osseous structures are without acute abnormality. Soft tissues are grossly normal. IMPRESSION: 1. Mixed pattern pulmonary edema. 2. Calcific atherosclerotic disease of the aorta. Electronically Signed   By: Ted Mcalpine M.D.   On: 02/28/2023 19:12   Recent Labs    03/01/23 0710  WBC 4.7  HGB 9.6*  HCT 30.4*  PLT 338    Recent Labs    03/01/23 0710  NA 135  K 4.2  CL 99  CO2 27  GLUCOSE 108*  BUN 14  CREATININE 0.94  CALCIUM 8.9     Intake/Output Summary (Last 24 hours) at 03/01/2023 1032 Last data filed at 03/01/2023 0810 Gross per 24 hour  Intake 838 ml  Output 1800 ml  Net -962 ml        Physical Exam: Vital Signs Blood pressure 95/61, pulse 61, temperature 98 F (36.7 C), temperature source Oral, resp. rate 18, height 5\' 8"  (1.727 m), weight 101.9 kg, SpO2 95%. Gen: no distress, normal appearing, BMI 34.16 HEENT: oral mucosa pink and moist, NCAT Cardio: RRR Chest: CTAB, normal effort, normal rate of breathing on room air  Abd: soft, non-distended, +BS Ext: no edema Psych: Pleasant, anxious Neuro: AAOx4. No apparent cognitive deficits  Skin: Groin catheterization sites well-healed, without apparent hematoma.  Clean, dry, intact.   GU: continent of  bladder DTRs: Reflexes were 2+ in bilateral achilles, patella, biceps, BR and triceps. Babinsky: flexor responses b/l.   Hoffmans: negative b/l Sensory exam: revealed normal sensation in all dermatomal regions in bilateral upper extremities and bilateral lower extremities Motor exam: strength 5-/5 throughout bilateral upper extremities and bilateral lower extremities Coordination: Fine motor coordination was normal.  Mild bilateral upper extremity intention tremor   MSK: Tender to palpation over sternum and left chest, no bruising or apparent lesions  Assessment/Plan: 1. Functional deficits which require 3+ hours per day of interdisciplinary therapy in a comprehensive inpatient rehab setting. Physiatrist is providing close team supervision and 24 hour management of active medical problems listed below. Physiatrist and rehab team continue to assess barriers to discharge/monitor patient progress toward functional and medical goals  Care Tool:  Bathing    Body parts bathed by patient: Right arm, Left arm, Chest, Abdomen, Front perineal area, Buttocks, Right upper leg, Left upper leg, Face, Right lower leg, Left lower leg   Body parts bathed by helper: Right lower leg, Left lower leg     Bathing assist Assist Level: Supervision/Verbal cueing     Upper Body Dressing/Undressing Upper body dressing   What is the patient wearing?: Pull over shirt, Bra    Upper  body assist Assist Level: Supervision/Verbal cueing    Lower Body Dressing/Undressing Lower body dressing      What is the patient wearing?: Underwear/pull up, Pants     Lower body assist Assist for lower body dressing: Supervision/Verbal cueing     Toileting Toileting    Toileting assist Assist for toileting: Supervision/Verbal cueing     Transfers Chair/bed transfer  Transfers assist     Chair/bed transfer assist level: Independent with assistive device Chair/bed transfer assistive device: Other (rollator)    Locomotion Ambulation   Ambulation assist      Assist level: Independent with assistive device Assistive device: Rollator Max distance: 177ft   Walk 10 feet activity   Assist     Assist level: Independent with assistive device Assistive device: Rollator   Walk 50 feet activity   Assist Walk 50 feet with 2 turns activity did not occur: Safety/medical concerns (fatigue, global weakness/deconditioning)  Assist level: Independent with assistive device Assistive device: Rollator    Walk 150 feet activity   Assist Walk 150 feet activity did not occur: Safety/medical concerns (fatigue, global weakness/deconditioning)  Assist level: Independent with assistive device Assistive device: Rollator    Walk 10 feet on uneven surface  activity   Assist Walk 10 feet on uneven surfaces activity did not occur: Safety/medical concerns (fatigue, global weakness/deconditioning)   Assist level: Supervision/Verbal cueing Assistive device: Rollator   Wheelchair     Assist Is the patient using a wheelchair?: No Type of Wheelchair: Manual Wheelchair activity did not occur: N/A         Wheelchair 50 feet with 2 turns activity    Assist    Wheelchair 50 feet with 2 turns activity did not occur: N/A       Wheelchair 150 feet activity     Assist  Wheelchair 150 feet activity did not occur: N/A       Blood pressure 95/61, pulse 61, temperature 98 F (36.7 C), temperature source Oral, resp. rate 18, height 5\' 8"  (1.727 m), weight 101.9 kg, SpO2 95%.  Medical Problem List and Plan: 1. 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  Current use of Antithrombotics/Antiplatelets: continue Eliquis 5 mg twice daily             -antiplatelet therapy: Aspirin and Plavix. D/c date for aspirin placed for 9/8 as per Dr. Nicki Guadalajara; discussed with patient   3. Pain: Tylenol, oxycodone 5 mg as needed             -continue Robaxin 500 mg BID             -  continue gabapentin 300 mg TID             -continue Lidoderm patches 1-2 as needed for pain  -kpad ordered   4. Anxiety:             -continue Buspar 10 mg BID and  -continue Xanax 3 mg BID and once daily  prn             -refusing Prozac 20 mg q HS>>will discontinue             -continue melatonin 3 mg q HS             -antipsychotic agents: n/a   5. Neuropsych/cognition: This patient is capable of making decisions on her own behalf.   6. Skin/Wound Care: Routine skin care checks   7. Fluids/Electrolytes/Nutrition: Routine Is and Os and follow-up chemistries             -hyponatremia>>improving, follow-up BMP   8: Hypertension: monitor TID and prn             -see meds #12   9: Hyperlipidemia: continue statin   10: CAD s/p PCI to RCA 2021; to LAD on 8/07: continue DAPT and statin   11: Aortic stenosis s/p TAVR 8/15: DAPT continues   12: Acute systolic CHF; EF ~01-09%; Lasix discontinued             -daily weight             -continue Farxiga 10 mg daily             -continue spironlactone 25 mg daily             -continue Entresto 49-51 BID (this dose started 8/26)    -9/7-8 weight appears overall stable from prior, weight was  100.1 kg on 9/4.  Continue to monitor  -Recheck CXR  Filed Weights   02/26/23 1059 02/27/23 0500 03/01/23 0500  Weight: 97.6 kg 99.6 kg 101.9 kg      13: Urinary retention: most likely due to constipation and immobility             -Foley now out   14: Refractory VT resolved: continue amiodarone 200 mg BID   15: Carotid stenosis s/p right CEA 2020             -follow-up with [facility] as per protocol   16: Anemia; acute blood loss/other: follow-up CBC   17: Acute DVT left internal jugular>>resolved on recent venous duplex, superficial cephalic vein thrombosis present             -plan is to continue triple therapy and discontinue aspirin 30 days post-PCI              -continue Eliquis 5 mg BID   18: Hx partial vulvectomies with difficult Foley cath placement:  -urology consulted>>Foley placed; removed 8/27   19: Sepsis due to pneumonia>>Enterobacter isolated, antibiotics completed             -Incentive spirometry every 2 hours while awake, placed nursing order to request that this be placed within reach of patient at all times             -As needed oxygen, wean as tolerated   20: Anemia, multifactorial: Hgb reviewed and is stable, repeat Monday   21: Thrombocytosis: trending upward; follow-up CBC  22. Left pleural effusion: stable on CXR, lasix decreased to 10mg  given milkd AKI daily weights ordered  23. Shortness of breath: improved,  increased oxygen to 2L, discussed goal O2 is 88-92%, improved, decreased lasix to 10mg   -9/7 patient had brief decrease of O2 sat to 86 with activity, improved to low 90s shortly after.  O2 nasal cannula as needed started.  -Recheck CXR  24. History of prediabetes: currently well controlled, d/c CBG checks, d/c Ensure between meals, discussed avoiding added sugar  25. Suboptimal vitamin D level: ergocalciferol 50,000U once per week ordered, continue  25. Obesity: d/c Ensure between meals, provided dietary education regarding whole food protein  sources. Monitor daily weights  26. AKI: discussed that creatinine has normalized, discussed that AKI could have been caused by Lasix. Repeat creatinine on Monday  27. Hypotension: d/c lasix 10mg , discussed with patient, discussed with Dr. Gala Romney and Sherlyn Lees, spironolactone, and amiodarone will also be decreased, will notify heart failure team that patient continues to feel dizzy and have low blood pressure.     03/01/2023    5:05 AM 03/01/2023    5:00 AM 02/28/2023    6:44 PM  Vitals with BMI  Weight  224 lbs 10 oz   BMI  34.17   Systolic 95  134  Diastolic 61  87  Pulse 61  78   28.  Insomnia  -Continue melatonin, she declines medication change at this time  20. Mixed pattern pulmonary edema: continue lasix 10mg  daily.   I spent >17mins performing patient care related activities, including face to face time, documentation time, med management, discussion of meds and lab orders with patient, and overall coordination of care.    LOS: 11 days A FACE TO FACE EVALUATION WAS PERFORMED  Drema Pry Joee Iovine 03/01/2023, 10:32 AM

## 2023-03-01 NOTE — Progress Notes (Signed)
Physical Therapy Session Note  Patient Details  Name: Madison West MRN: [id number] Date of Birth: August 10, 1955  Today's Date: 03/01/2023 PT Individual Time: 0823-0922 and 6440-3474 PT Individual Time Calculation (min): 59 min and 43 min  Short Term Goals: Week 1:  PT Short Term Goal 1 (Week 1): STG=LTG due to LOS  Skilled Therapeutic Interventions/Progress Updates:   Treatment Session 1 Received pt sitting in recliner, pt agreeable to PT treatment, and did not report pain during session. Pt on RA with SPO2 91-96% at rest and remained >90% with activity - continued to reinforce importance of stopping talking and focusing on breathing. Pt with 1 instance of SPO2 dropping to 81% but unsure of accuracy, as SPO2 quickly and suddenly increased to 92%. Pt still anxious about D/C, requesting to stay until Friday - explained to pt that she is at a mod I level with rollator but she is worried about being home alone. She states she will be home alone from 9am-6pm while her daughters are at work and doesn't feel like she can manage.   Session with emphasis on functional mobility/transfers, generalized strengthening and endurance, dynamic standing balance/coordination, gait training, and energy conservation. Pt performed all transfers with rollator and mod I throughout session. Pt ambulated 145ft  with rollator and mod I to main therapy gym. Pt participated in BERG balance test. Patient demonstrates increased fall risk as noted by score of 45/56 on Berg Balance Scale.  (<36= high risk for falls, close to 100%; 37-45 significant >80%; 46-51 moderate >50%; 52-55 lower >25%). Discussed importance of using rollator upon D/C for stability. Of note, pt required more than adequate time to complete BERG due to MD arriving, pt getting distracted talking, and then emotional about going home.   Pt reported mild dizziness - BP: 108/66. MD arrived for morning rounds and updated on pt's CLOF. Pt then ambulated 165ft with  rollator and mod I back to room. Concluded session with pt sitting in recliner with all needs within reach. Pt made mod I in room.   Treatment Session 2 Received pt sitting in recliner, pt agreeable to PT treatment, and did not report pain during session. Pt on RA throughout session. Session with emphasis on functional mobility/transfers, toileting, generalized strengthening and endurance, dynamic standing balance/coordination, and gait training. Pt reported urge to void - reminded pt that she is independent and able to take herself to the bathroom. Pt reports she has not taken herself to the bathroom because she is anxious to do so -  encouraged pt to try in preparation for upcoming discharge. Pt performed all transfers with rollator and mod I throughout session. Pt ambulated in/out of bathroom with rollator and mod I and able to perform all toileting tasks without assist. SPO2 dropped to 75% and pt reporting only mild SOB; SPO2 quickly increased to 93% with seated rest and pursed lip breathing.   Pt then ambulated 166ft x 2 trials with rollator and mod I to/from main therapy gym. Stepped onto [employer] and worked on dynamic standing balance, coordination, reaction time, endurance, and proprioception tossing ball on rebounder for 1 minute with CGA for balance - 1 instance of posterior LOB requiring min A to correct. Of note, unsure of accuracy of pulse ox as SPO2 reading 69%, however pt only mildly SOB and SPO2 increasing quickly to 95% - RN and MD notified. Concluded session with pt sitting in recliner with all needs within reach. Pt left on RA with SPO2 92%.   Therapy Documentation  Precautions:  Precautions Precautions: Fall Precaution Comments: monitor O2 Restrictions Weight Bearing Restrictions: No RLE Weight Bearing: Weight bearing as tolerated  Therapy/Group: Individual Therapy Marlana Salvage Zaunegger Blima Rich PT, DPT 03/01/2023, 7:05 AM

## 2023-03-01 NOTE — Progress Notes (Signed)
Patient ID: Madison West, female   DOB: 16-Jan-1956, 67 y.o.   MRN: [id number]  Rollator ordered through [employer]

## 2023-03-01 NOTE — Progress Notes (Signed)
Occupational Therapy Session Note  Patient Details  Name: Madison West MRN: [id number] Date of Birth: 09/25/1955  Today's Date: 03/01/2023 OT Individual Time: 7829-5621 OT Individual Time Calculation (min): 55 min    Short Term Goals: Week 2:  OT Short Term Goal 1 (Week 2): Continue progressing towards STG as = LTG due to ELOS  Skilled Therapeutic Interventions/Progress Updates:  Skilled OT intervention completed with focus on DC planning, DME education, ambulatory/cardiovascular endurance and shower transfers. Pt received seated in recliner, agreeable to session. No pain reported.  Pt received on RA; O2 sats at 86% while talking but rebounded to 95% with pursed lip breathing. With mobility, she remained on RA and did desat to 80%, but returned within 1-2 mins with pursed lip breathing to 93%.   Pt was tangential at start of session discussing her appeal of the DC set by care team. After discussing with pt, pt shared that her primary reason for wanting almost a week's extension (9/13) is so her daughters will be home for support vs at work as she transitions at home. She also expressed that she feels she can't do things independently and wants more therapy to feel more confident in herself. OT advised pt of her mod I clearance for in room mobility and self care, with encouragement to trial mobility in room with rollator without assist to start preparing for mod I DC level. Pt admits that she has anxiety after caring for her husband and being told it was a medical emergency for him if he dropped < 88% and now that she is dropping frequently she is fearful about how she will manage this independently. Discussed energy conservation strategies in prep for mobility at home and other ways during ADLs and encouraged pt that she can't stay in the hospital with 24/7 care if she is mobilizing independently and is only receiving PRN meds/O2 at night as she could DC with these also.  Adapt delivered  pt's rollator. OT made handle adjustments for pt's height. Pt utilized new rollator during session to ambulate about 100 ft x2 with prolonged seated rest break but at mod I level. Ambulated to ADL apartment. Confirmed bathroom set up, with pt reporting walk in shower with curtain with corner seat. Advised that shower chair would be safest considering CLOF, environmental set up and pt's balance/endurance deficits. Pt ambulated to doorway with shower frame for simulated shower transfer. Cues needed for rollator, hand and body positioning but able to side step in/out of shower with supervision and sit on shower chair. Pt reports having grab bars, and discussed seated showers for energy conservation and pt receptive.  Ambulated back to room. Pt remained seated in recliner with alarms left off due to mod I clearance. All needs in reach at end of session.   Therapy Documentation Precautions:  Precautions Precautions: Fall Precaution Comments: monitor O2 Restrictions Weight Bearing Restrictions: No    Therapy/Group: Individual Therapy  Melvyn Novas, MS, OTR/L  03/01/2023, 12:28 PM

## 2023-03-02 NOTE — Progress Notes (Signed)
PROGRESS NOTE   Subjective/Complaints: No new complaints this morning Appreciate SW following up on status of appeal Discussed modI in room today!  ROS: shortness of breath resolved at rest- still present with exertion/while talking, +anxiety, denies chest pain, new motor or sensory changes   Objective:   DG Chest 2 View  Result Date: 02/28/2023 CLINICAL DATA:  Shortness of breath. EXAM: CHEST - 2 VIEW COMPARISON:  None Available. FINDINGS: Calcific atherosclerotic disease of the aorta. Cardiomediastinal silhouette is normal. Mediastinal contours appear intact. Mixed pattern pulmonary edema.  No definite pleural effusions. Osseous structures are without acute abnormality. Soft tissues are grossly normal. IMPRESSION: 1. Mixed pattern pulmonary edema. 2. Calcific atherosclerotic disease of the aorta. Electronically Signed   By: Ted Mcalpine M.D.   On: 02/28/2023 19:12   Recent Labs    03/01/23 0710  WBC 4.7  HGB 9.6*  HCT 30.4*  PLT 338    Recent Labs    03/01/23 0710  NA 135  K 4.2  CL 99  CO2 27  GLUCOSE 108*  BUN 14  CREATININE 0.94  CALCIUM 8.9     Intake/Output Summary (Last 24 hours) at 03/02/2023 7829 Last data filed at 03/02/2023 0801 Gross per 24 hour  Intake 1194 ml  Output 1100 ml  Net 94 ml        Physical Exam: Vital Signs Blood pressure 129/63, pulse 76, temperature 98.4 F (36.9 C), temperature source Oral, resp. rate 18, height 5\' 8"  (1.727 m), weight 101.9 kg, SpO2 92%. Gen: no distress, normal appearing, BMI 34.16 HEENT: oral mucosa pink and moist, NCAT Cardio: RRR Chest: CTAB, normal effort, normal rate of breathing on room air  Abd: soft, non-distended, +BS Ext: no edema Psych: Pleasant, anxious Neuro: AAOx4. No apparent cognitive deficits  Skin: Groin catheterization sites well-healed, without apparent hematoma.  Clean, dry, intact.   GU: continent of bladder Neuro:  verbose DTRs: Reflexes were 2+ in bilateral achilles, patella, biceps, BR and triceps. Babinsky: flexor responses b/l.   Hoffmans: negative b/l Sensory exam: revealed normal sensation in all dermatomal regions in bilateral upper extremities and bilateral lower extremities Motor exam: strength 5-/5 throughout bilateral upper extremities and bilateral lower extremities Coordination: Fine motor coordination was normal.  Mild bilateral upper extremity intention tremor   MSK: Tender to palpation over sternum and left chest, no bruising or apparent lesions  Assessment/Plan: 1. Functional deficits which require 3+ hours per day of interdisciplinary therapy in a comprehensive inpatient rehab setting. Physiatrist is providing close team supervision and 24 hour management of active medical problems listed below. Physiatrist and rehab team continue to assess barriers to discharge/monitor patient progress toward functional and medical goals  Care Tool:  Bathing    Body parts bathed by patient: Right arm, Left arm, Chest, Abdomen, Front perineal area, Buttocks, Right upper leg, Left upper leg, Face, Right lower leg, Left lower leg   Body parts bathed by helper: Right lower leg, Left lower leg     Bathing assist Assist Level: Supervision/Verbal cueing     Upper Body Dressing/Undressing Upper body dressing   What is the patient wearing?: Pull over shirt, Bra    Upper body assist  Assist Level: Supervision/Verbal cueing    Lower Body Dressing/Undressing Lower body dressing      What is the patient wearing?: Underwear/pull up, Pants     Lower body assist Assist for lower body dressing: Supervision/Verbal cueing     Toileting Toileting    Toileting assist Assist for toileting: Supervision/Verbal cueing     Transfers Chair/bed transfer  Transfers assist     Chair/bed transfer assist level: Independent with assistive device Chair/bed transfer assistive device: Other (rollator)    Locomotion Ambulation   Ambulation assist      Assist level: Independent with assistive device Assistive device: Rollator Max distance: 242ft   Walk 10 feet activity   Assist     Assist level: Independent with assistive device Assistive device: Rollator   Walk 50 feet activity   Assist Walk 50 feet with 2 turns activity did not occur: Safety/medical concerns (fatigue, global weakness/deconditioning)  Assist level: Independent with assistive device Assistive device: Rollator    Walk 150 feet activity   Assist Walk 150 feet activity did not occur: Safety/medical concerns (fatigue, global weakness/deconditioning)  Assist level: Independent with assistive device Assistive device: Rollator    Walk 10 feet on uneven surface  activity   Assist Walk 10 feet on uneven surfaces activity did not occur: Safety/medical concerns (fatigue, global weakness/deconditioning)   Assist level: Independent with assistive device Assistive device: Rollator   Wheelchair     Assist Is the patient using a wheelchair?: No Type of Wheelchair: Manual Wheelchair activity did not occur: N/A         Wheelchair 50 feet with 2 turns activity    Assist    Wheelchair 50 feet with 2 turns activity did not occur: N/A       Wheelchair 150 feet activity     Assist  Wheelchair 150 feet activity did not occur: N/A       Blood pressure 129/63, pulse 76, temperature 98.4 F (36.9 C), temperature source Oral, resp. rate 18, height 5\' 8"  (1.727 m), weight 101.9 kg, SpO2 92%.  Medical Problem List and Plan: 1. 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  Current use of Antithrombotics/Antiplatelets: continue Eliquis 5 mg twice daily             -antiplatelet therapy: Aspirin and Plavix. D/c date for aspirin placed for 9/8 as per Dr. Nicki Guadalajara; discussed with patient  3. Pain: Tylenol, oxycodone 5 mg as needed             -continue Robaxin 500 mg BID             -continue gabapentin 300 mg TID             -continue Lidoderm patches 1-2 as needed for pain  -kpad ordered   4. Anxiety:             -continue Buspar 10 mg BID and  -continue Xanax 3 mg BID and once daily  prn             -refusing Prozac 20 mg q HS>>will discontinue             -continue melatonin 3 mg q HS             -antipsychotic agents: n/a   5. Neuropsych/cognition: This patient is capable of making decisions on her own behalf.   6. Skin/Wound Care: Routine skin care checks   7. Fluids/Electrolytes/Nutrition: Routine Is and Os and follow-up chemistries             -hyponatremia>>improving, follow-up BMP   8: Hypertension: monitor TID and prn             -see meds #12   9: Hyperlipidemia: continue statin   10: CAD s/p PCI to RCA 2021; to LAD on 8/07: continue DAPT and statin   11: Aortic stenosis s/p TAVR 8/15: DAPT continues   12: Acute systolic CHF; EF ~16-60%; Lasix discontinued             -daily weight             -continue Farxiga 10 mg daily             -continue spironlactone 25 mg  daily             -continue Entresto 49-51 BID (this dose started 8/26)    -9/7-8 weight appears overall stable from prior, weight was 100.1 kg on 9/4.  Continue to monitor  -Recheck CXR  Filed Weights   02/26/23 1059 02/27/23 0500 03/01/23 0500  Weight: 97.6 kg 99.6 kg 101.9 kg      13: Urinary retention: most likely due to constipation and immobility             -Foley now out   14: Refractory VT resolved: continue amiodarone 200 mg BID   15: Carotid stenosis s/p right CEA 2020             -follow-up with [facility] as per protocol   16: Anemia; acute blood loss/other: follow-up CBC   17: Acute DVT left internal jugular>>resolved on recent venous duplex, superficial cephalic vein thrombosis present             -plan is to continue triple therapy and discontinue aspirin 30 days post-PCI              -continue Eliquis 5 mg BID   18: Hx partial vulvectomies with difficult Foley cath placement:  -urology consulted>>Foley placed; removed 8/27   19: Sepsis due to pneumonia>>Enterobacter isolated, antibiotics completed             -Incentive spirometry every 2 hours while awake, placed nursing order to request that this be placed within reach of patient at all times             -As needed oxygen, wean as tolerated   20: Anemia, multifactorial: Hgb reviewed  and is stable, repeat Monday   21: Thrombocytosis: trending upward; follow-up CBC  22. Left pleural effusion: stable on CXR, lasix decreased to 10mg  given milkd AKI daily weights ordered  23. Shortness of breath: improved, increased oxygen to 2L, discussed goal O2 is 88-92%, improved, decreased lasix to 10mg   -9/7 patient had brief decrease of O2 sat to 86 with activity, improved to low 90s shortly after.  O2 nasal cannula as needed started.  -Recheck CXR  24. History of prediabetes: currently well controlled, d/c CBG checks, d/c Ensure between meals, discussed avoiding added sugar  25. Suboptimal vitamin D level: ergocalciferol  50,000U once per week ordered, continue  25. Obesity: d/c Ensure between meals, provided dietary education regarding whole food protein sources. Monitor daily weights  26. AKI: discussed that creatinine has normalized, discussed that AKI could have been caused by Lasix. Repeat creatinine on Monday  27. Hypotension: d/c lasix 10mg , discussed with patient, discussed with Dr. Gala Romney and Sherlyn Lees, spironolactone, and amiodarone will also be decreased, will notify heart failure team that patient continues to feel dizzy and have low blood pressure. Discussed with therapy applying teds    03/02/2023    3:45 AM 03/01/2023    8:04 PM 03/01/2023    2:56 PM  Vitals with BMI  Systolic 129 108 829  Diastolic 63 64 54  Pulse 76 78 74   28.  Insomnia  -continue melatonin, she declines medication change at this time  20. Mixed pattern pulmonary edema: continue lasix 20mg  daily. Asked nursing to weigh patient today  >50 minutes spent in discussing patient's concerns regarding returning home without her daughters present, medications reviewed and will continue lasix, messaged nursing to weigh patient today, discussed that we have changed her to every day since she has met her therapy goals in PT, discussed that she still has OT goals to work on, chart reviewed and appreciate SW following up on appeal status  LOS: 12 days A FACE TO FACE EVALUATION WAS PERFORMED  Drema Pry Shantrice Rodenberg 03/02/2023, 9:37 AM

## 2023-03-02 NOTE — Progress Notes (Addendum)
Physical Therapy Session Note  Patient Details  Name: Madison West MRN: [id number] Date of Birth: 06-06-1956  Today's Date: 03/02/2023 PT Individual Time: 0915-0944 PT Individual Time Calculation (min): 29 min   Short Term Goals: Week 2:     Skilled Therapeutic Interventions/Progress Updates:      Pt seated in recliner upon arrival. Pt agreeable to therapy. Pt denies any pain.   Treatment session focused on improving pt endurance, and dynamic balance.   Pt ambulated with rollator and supervision from room to outside, over threshold of elevator, up and down inclined sidewalk. Pt required increased time with up hill incline and seated rest break 2/2 fatigue. Intermittent verbal cues provided for safety with rollator with ambulatory transfer.   Pt seated in recliner at end of session with all needs within reach.     Therapy Documentation Precautions:  Precautions Precautions: Fall Precaution Comments: monitor O2 Restrictions Weight Bearing Restrictions: No RLE Weight Bearing: Weight bearing as tolerated  Therapy/Group: Individual Therapy  [facility] Ambrose Finland, [place], DPT  03/02/2023, 7:44 AM

## 2023-03-02 NOTE — Progress Notes (Signed)
Patient ID: Madison West, female   DOB: 05-08-1956, 67 y.o.   MRN: [id number]  Sw has not received an update on appeal. SW reached out to [facility] to follow up on status. Sw informed that from their records appeals has not been placed. Patient expressed appeal was placed on 9/9. Patient instructed to contact [facility] at [phone].   SW will wait for patient to finish therapy session then follow up.

## 2023-03-02 NOTE — Progress Notes (Signed)
Speech Language Pathology Daily Session Note  Patient Details  Name: Madison West MRN: [id number] Date of Birth: Jun 22, 1956  Today's Date: 03/02/2023 SLP Individual Time: 1400-1506 SLP Individual Time Calculation (min): 66 min  Short Term Goals: Week 2: SLP Short Term Goal 1 (Week 2): STGs=LTGs due to ELOS  Skilled Therapeutic Interventions:  Pt was seen in PM to address cognitive re-training. Pt was alert and seated upright in recliner upon SLP arrival. Pt was agreeable for session. Pt often recounting stories and information told to this SLP the previous date. She frequently required redirection to task and often presented with tangential and circumstantial speech. SLP challenged pt in problem solving through presenting medical, daily living, and financial situations verbally. Pt identified an appropriate solution with 100% acc with mod I. SLP subsequently addressed financial management through presenting word problems verbally. In a mildly distracting environment pt completed task with 90% acc with mod I improving to 100% with sup A. Pt indep requested repetition and additional time as needed. Pt left seated upright in recliner with call button within reach. SLP to continue POC.   Pain Pain Assessment Pain Scale: 0-10 Pain Score: 6  Pain Type: Acute pain Pain Location: Chest  Therapy/Group: Individual Therapy  Renaee Munda 03/02/2023, 3:28 PM

## 2023-03-02 NOTE — Progress Notes (Signed)
Occupational Therapy Session Note  Patient Details  Name: Madison West MRN: [id number] Date of Birth: [birth year]  Today's Date: 03/02/2023 OT Individual Time: 1300-1340 OT Individual Time Calculation (min): 40 min    Short Term Goals: Week 2:  OT Short Term Goal 1 (Week 2): Continue progressing towards STG as = LTG due to ELOS  Skilled Therapeutic Interventions/Progress Updates:  Skilled OT intervention completed with focus on tub/shower transfers, ambulatory/cardiorespiratory endurance, IADL management. Pt received seated in recliner, agreeable to session. No pain reported.  Pt remains cleared for mod I mobility in room, therefore assisted herself to bathroom and reported did well without issues. Declined other self-care needs.   Completed all sit > stands and ambulatory transfers using rollator with mod I. Ambulated to ADL apartment. Quicker pace with gait noted while talking and O2 sat did drop to 80% but with seated recovery and pursed lip breathing pt recovered to 94% while still on RA. Repeated this process similarity during session and OT had pt start self-using the pulse ox to monitor sats in prep for DC. Re-education provided about SOB, parameters to stay within and energy conservation strategies to utilize.   Practiced stepping in/out tub/shower with shower chair seat as pt has walk in and tub at home and is unsure where she plans to bathe. Able to complete 2 rounds at supervision level of stepping in/out and sit > stand from shower chair. Discussed energy conservation strategies and safety considerations with bathing.  Ambulated to ADL kitchen. In kitchen, pt demonstrated ability to reach into various cabinet heights, gather items including moderately heavy pans/items, open/close fridge door, all at the supervision level without LOB or fatigue while using rollator. Pt was able to prep a simple stove top meal via meal cards, with accurate sequencing, safety awareness and higher  level dual tasking. Discussed cutting and microwave safety with modified strategies for fatigue.  Ambulated back to room then remained sitting in recliner with all needs in reach at end of session.   Therapy Documentation Precautions:  Precautions Precautions: Fall Precaution Comments: monitor O2 Restrictions Weight Bearing Restrictions: No RLE Weight Bearing: Weight bearing as tolerated   Therapy/Group: Individual Therapy  Melvyn Novas, MS, OTR/L  03/02/2023, 3:47 PM

## 2023-03-02 NOTE — Progress Notes (Signed)
Physical Therapy Session Note  Patient Details  Name: Madison West MRN: [id number] Date of Birth: 03/16/1956  Today's Date: 03/02/2023 PT Individual Time: 2536-6440 PT Individual Time Calculation (min): 68 min   Short Term Goals: Week 1:  PT Short Term Goal 1 (Week 1): STG=LTG due to LOS  Skilled Therapeutic Interventions/Progress Updates:   Received pt standing at sink brushing hair with RN present. Pt agreeable to PT treatment and did not report pain during session. Pt on RA with SPO2 89% standing at sink. Session with emphasis on functional mobility, generalized strengthening and endurance, dynamic standing balance/coordination, stair navigation, and ambulation. Pt performed all transfers with rollator and mod I throughout session. Pt ambulated 162ft with rollator and mod I to main therapy gym - SPO2 dropped to 85% increasing to 93% with seated rest and pursed lip breathing. Pt then navigated 12 6in steps with 2 handrails and supervision ascending and descending with a step to pattern - SPO2 dropped to 86%, increasing to 93%. Pt ambulated additional 130ft with rollator and mod I to ortho gym and performed seated BUE/BLE strengthening on Nustep at workload 4 for 10 minutes with steps/minute >40 with emphasis on cardiovascular endurance and global strengthening/endurance - SPO2 84% increasing to 94% in <15 seconds with seated rest. RPE 12/20 - between light and somewhat hard. Pt ambulated 64ft on uneven surfaces (ramp and mulch) with rollator and supervision/mod I and navigated 1 5in curb with rollator and supervision with only 1 cue to lock brakes when stepping up onto curb - SPO2 dropped to 89% but recovered to 98%. Pt then ambulated additional 160ftx 1, 176ft x 1, and ~252ft x 1 with rollator and mod I to/from [facility] with 2 seated rest breaks - SPO2 dropped to 82% but quickly increased to 95% with seated rest and pursed lip breathing. Returned to room and concluded session with pt sitting  in recliner with all needs within reach. Pt mod I in room.   Therapy Documentation Precautions:  Precautions Precautions: Fall Precaution Comments: monitor O2 Restrictions Weight Bearing Restrictions: No RLE Weight Bearing: Weight bearing as tolerated  Therapy/Group: Individual Therapy Marlana Salvage Zaunegger Blima Rich PT, DPT 03/02/2023, 6:51 AM

## 2023-03-02 NOTE — Progress Notes (Signed)
Physical Therapy Note  Patient Details  Name: Madison West MRN: [id number] Date of Birth: 08-03-1955 Today's Date: 03/02/2023   Pt's plan of care adjusted to Q.D. after speaking with care team and discussed with MD in team conference as pt currently unable to tolerate current therapy schedule with OT, PT, and SLP.     Marlana Salvage Zaunegger Tobi Bastos Zaunegger PT, DPT 03/02/2023, 8:10 AM

## 2023-03-02 NOTE — Progress Notes (Addendum)
Patient ID: Madison West, female   DOB: [birth year], 67 y.o.   MRN: [id number]  SW has received notification of appeal from [place] with [facility]. Case # [id number]. Clinicals to be submitted.   Updated confirmation # from [facility]: CONFIRMATION # [id number]

## 2023-03-03 NOTE — Progress Notes (Signed)
Physical Therapy Discharge Summary  Patient Details  Name: Madison West MRN: [id number] Date of Birth: 13-Apr-1956  Date of Discharge from PT service:March 05, 2023  Patient has met 9 of 9 long term goals due to improved activity tolerance, improved balance, improved postural control, increased strength, ability to compensate for deficits, improved awareness, and improved coordination.  Patient to discharge at an ambulatory level Modified Independent using rollator.   All goals met   Recommendation:  Patient will benefit from ongoing skilled PT services in outpatient setting to continue to advance safe functional mobility, address ongoing impairments in transfers, generalized strengthening and endurance, dynamic standing balance/coordination, gait training energy conservation, and to minimize fall risk.  Equipment: rollator  Reasons for discharge: treatment goals met  Patient/family agrees with progress made and goals achieved: Yes  PT Discharge Precautions/Restrictions Precautions Precautions: Fall Precaution Comments: monitor O2 Restrictions Weight Bearing Restrictions: No Pain Interference Pain Interference Pain Effect on Sleep: 1. Rarely or not at all Pain Interference with Therapy Activities: 1. Rarely or not at all Pain Interference with Day-to-Day Activities: 1. Rarely or not at all Cognition Overall Cognitive Status: Within Functional Limits for tasks assessed Arousal/Alertness: Awake/alert Orientation Level: Oriented X4 Memory: Appears intact Awareness: Appears intact Problem Solving: Appears intact Safety/Judgment: Appears intact Comments: anxious and hyperverbal Sensation Sensation Light Touch: Appears Intact Hot/Cold: Not tested Proprioception: Appears Intact Stereognosis: Not tested Coordination Gross Motor Movements are Fluid and Coordinated: Yes Fine Motor Movements are Fluid and Coordinated: Yes Coordination and Movement Description: global  weakness/deconditioning Finger Nose Finger Test: [facility] bilaterally Heel Shin Test: [facility] bilaterally Motor  Motor Motor: Within Functional Limits  Mobility Bed Mobility Bed Mobility: Rolling Right;Rolling Left;Sit to Supine;Supine to Sit Rolling Right: Independent Rolling Left: Independent Supine to Sit: Independent Sit to Supine: Independent Transfers Transfers: Sit to Stand;Stand to Sit;Stand Pivot Transfers Sit to Stand: Independent with assistive device Stand to Sit: Independent with assistive device Stand Pivot Transfers: Independent with assistive device Transfer (Assistive device): Rollator Locomotion  Gait Ambulation: Yes Gait Assistance: Independent with assistive device Gait Distance (Feet): 200 Feet Assistive device: Rollator Gait Gait: Yes Gait Pattern: Impaired Gait Pattern: Poor foot clearance - right;Narrow base of support;Poor foot clearance - left;Decreased stride length;Decreased step length - right;Decreased step length - left;Decreased stance time - right Gait velocity: decreased Stairs / Additional Locomotion Stairs: Yes Stairs Assistance: Supervision/Verbal cueing Stair Management Technique: Two rails Number of Stairs: 12 Height of Stairs: 6 Ramp: Independent with assistive device (rollator) Curb: Supervision/Verbal cueing (rollator) Wheelchair Mobility Wheelchair Mobility: No  Trunk/Postural Assessment  Cervical Assessment Cervical Assessment: Within Functional Limits Thoracic Assessment Thoracic Assessment: Within Functional Limits Lumbar Assessment Lumbar Assessment: Within Functional Limits Postural Control Postural Control: Within Functional Limits  Balance Balance Balance Assessed: Yes Standardized Balance Assessment Standardized Balance Assessment: Berg Balance Test Berg Balance Test Sit to Stand: Able to stand without using hands and stabilize independently Standing Unsupported: Able to stand safely 2 minutes Sitting with Back  Unsupported but Feet Supported on Floor or Stool: Able to sit safely and securely 2 minutes Stand to Sit: Sits safely with minimal use of hands Transfers: Able to transfer safely, minor use of hands Standing Unsupported with Eyes Closed: Able to stand 10 seconds safely Standing Ubsupported with Feet Together: Able to place feet together independently and stand 1 minute safely From Standing, Reach Forward with Outstretched Arm: Can reach forward >12 cm safely (5") From Standing Position, Pick up Object from Floor: Able to pick up shoe, needs  supervision From Standing Position, Turn to Look Behind Over each Shoulder: Looks behind from both sides and weight shifts well Turn 360 Degrees: Able to turn 360 degrees safely but slowly Standing Unsupported, Alternately Place Feet on Step/Stool: Able to complete 4 steps without aid or supervision Standing Unsupported, One Foot in Front: Loses balance while stepping or standing Standing on One Leg: Able to lift leg independently and hold 5-10 seconds Total Score: 45 Static Sitting Balance Static Sitting - Balance Support: Feet supported;No upper extremity supported Static Sitting - Level of Assistance: 7: Independent Dynamic Sitting Balance Dynamic Sitting - Balance Support: Feet supported;No upper extremity supported Dynamic Sitting - Level of Assistance: 7: Independent Static Standing Balance Static Standing - Balance Support: Bilateral upper extremity supported;During functional activity (rollator) Static Standing - Level of Assistance: 6: Modified independent (Device/Increase time) Dynamic Standing Balance Dynamic Standing - Balance Support: Bilateral upper extremity supported;During functional activity (rollator) Dynamic Standing - Level of Assistance: 6: Modified independent (Device/Increase time) Dynamic Standing - Comments: transfers and gait Extremity Assessment  RLE Assessment RLE Assessment: Exceptions to [facility] General Strength Comments:  tested sitting EOB RLE Strength Right Hip Flexion: 4/5 Right Hip ABduction: 4/5 Right Hip ADduction: 4/5 Right Knee Flexion: 4/5 Right Knee Extension: 4/5 Right Ankle Dorsiflexion: 4-/5 Right Ankle Plantar Flexion: 4+/5 LLE Assessment LLE Assessment: Exceptions to [facility] General Strength Comments: tested sitting EOB LLE Strength Left Hip Flexion: 4/5 Left Hip ABduction: 4/5 Left Hip ADduction: 4/5 Left Knee Flexion: 4/5 Left Knee Extension: 4/5 Left Ankle Dorsiflexion: 4-/5 Left Ankle Plantar Flexion: 4+/5   Madison West Madison West PT, DPT 03/03/2023, 7:23 AM

## 2023-03-03 NOTE — Progress Notes (Signed)
Occupational Therapy Discharge Summary  Patient Details  Name: Madison West MRN: [id number] Date of Birth: Jul 04, 1955  Date of Discharge from OT service:March 05, 2023  Patient has met 10 of 10 long term goals due to improved activity tolerance, improved balance, improved awareness, and improved coordination.  Patient to discharge at overall Modified Independent level.  Patient's care partner is independent to provide the necessary physical assistance at discharge.  All goals met  Recommendation:  Patient will benefit from ongoing skilled OT services in outpatient setting to continue to advance functional skills in the area of BADL, iADL, and Vocation.  Equipment: Shower chair with back  Reasons for discharge: treatment goals met  Patient/family agrees with progress made and goals achieved: Yes  OT Discharge Precautions/Restrictions  Precautions Precautions: Fall Precaution Comments: monitor O2 Restrictions Weight Bearing Restrictions: No ADL ADL Eating: Independent Where Assessed-Eating: Chair Grooming: Independent Where Assessed-Grooming: Standing at sink Upper Body Bathing: Independent Where Assessed-Upper Body Bathing: Shower Lower Body Bathing: Modified independent Where Assessed-Lower Body Bathing: Shower Upper Body Dressing: Independent Where Assessed-Upper Body Dressing: Chair Lower Body Dressing: Modified independent Where Assessed-Lower Body Dressing: Sitting at sink, Standing at sink Toileting: Modified independent Where Assessed-Toileting: Neurosurgeon Method: Proofreader: Other (comment) (rollator) Tub/Shower Transfer: Modified independent Web designer Method: Ship broker: Information systems manager with back Film/video editor: Modified independent Film/video editor Method: Designer, industrial/product: Information systems manager with back, Grab  bars Vision Baseline Vision/History: 0 No visual deficits Patient Visual Report: No change from baseline Vision Assessment?: No apparent visual deficits Perception  Perception: Within Functional Limits Praxis Praxis: WFL Cognition Cognition Overall Cognitive Status: Within Functional Limits for tasks assessed Arousal/Alertness: Awake/alert Orientation Level: Person;Place;Situation Person: Oriented Place: Oriented Situation: Oriented Memory: Appears intact Awareness: Appears intact Problem Solving: Appears intact Safety/Judgment: Appears intact Comments: anxious and hyperverbal Brief Interview for Mental Status (BIMS) Repetition of Three Words (First Attempt): 3 Temporal Orientation: Year: Correct Temporal Orientation: Month: Accurate within 5 days Temporal Orientation: Day: Correct Recall: "Sock": Yes, no cue required Recall: "Blue": Yes, no cue required Recall: "Bed": Yes, no cue required BIMS Summary Score: 15 Sensation Sensation Light Touch: Appears Intact Hot/Cold: Appears Intact Proprioception: Appears Intact Stereognosis: Not tested Coordination Gross Motor Movements are Fluid and Coordinated: Yes Fine Motor Movements are Fluid and Coordinated: Yes Coordination and Movement Description: global weakness/deconditioning Finger Nose Finger Test: [facility] bilaterally Heel Shin Test: [facility] bilaterally Motor  Motor Motor: Within Functional Limits Mobility  Bed Mobility Bed Mobility: Rolling Right;Rolling Left;Sit to Supine;Supine to Sit Rolling Right: Independent Rolling Left: Independent Supine to Sit: Independent Sit to Supine: Independent Transfers Sit to Stand: Independent with assistive device Stand to Sit: Independent with assistive device  Trunk/Postural Assessment  Cervical Assessment Cervical Assessment: Within Functional Limits Thoracic Assessment Thoracic Assessment: Within Functional Limits Lumbar Assessment Lumbar Assessment: Within Functional  Limits Postural Control Postural Control: Within Functional Limits  Balance Balance Balance Assessed: Yes Static Sitting Balance Static Sitting - Balance Support: Feet supported;No upper extremity supported Static Sitting - Level of Assistance: 7: Independent Dynamic Sitting Balance Dynamic Sitting - Balance Support: Feet supported;No upper extremity supported Dynamic Sitting - Level of Assistance: 7: Independent Static Standing Balance Static Standing - Balance Support: Bilateral upper extremity supported;During functional activity (rollator) Static Standing - Level of Assistance: 6: Modified independent (Device/Increase time) Dynamic Standing Balance Dynamic Standing - Balance Support: Bilateral upper extremity supported;During functional activity (rollator) Dynamic Standing - Level of Assistance: 6:  Modified independent (Device/Increase time) Dynamic Standing - Comments: transfers and gait Extremity/Trunk Assessment RUE Assessment RUE Assessment: Within Functional Limits LUE Assessment LUE Assessment: Within Functional Limits   Tequia Wolman E Janielle Mittelstadt, MS, OTR/L  03/05/2023, 3:39 PM

## 2023-03-03 NOTE — Patient Care Conference (Signed)
Inpatient RehabilitationTeam Conference and Plan of Care Update Date: 03/03/2023   Time: 11:40 AM    Patient Name: Madison West      Medical Record Number: [id number]  Date of Birth: November 25, 1955 Sex: Female         Room/Bed: 4M04C/4M04C-01 Payor Info: Payor: MEDICARE / Plan: MEDICARE PART A AND B / Product Type: *No Product type* /    Admit Date/Time:  02/18/2023  2:38 PM  Primary Diagnosis:  Debility  Hospital Problems: Principal Problem:   Debility    Expected Discharge Date: Expected Discharge Date:  (d/c pending appeal)  Team Members Present: Physician leading conference: Dr. Sula Soda Social Worker Present: Lavera Guise, BSW Nurse Present: Chana Bode, RN PT Present: Raechel Chute, PT OT Present: Candee Furbish, OT SLP Present: Jeannie Done, SLP PPS Coordinator present : Fae Pippin, SLP     Current Status/Progress Goal Weekly Team Focus  Bowel/Bladder   patient is continent of b/b   maintain continence   encourage patient to not wait until it is urgent    Swallow/Nutrition/ Hydration               ADL's   Mod I UB, LB and toileting   Supervision/mod I   Barriers- anxiety that limits pt's confidence with completing tasks independently and at DC; cardiovascular/respiratory endurance    Mobility   Mod I/supervision steps   Mod I, supervision steps  global strengthening/endurance - pt has met all goals    Communication                Safety/Cognition/ Behavioral Observations  supervision- Mod I   Mod I   complex problem solving, attention, recall with compensatory strategies    Pain   patient complains of sternum pain from chest compressions usually 6/10 on 0-10 pain scale   get pain to <3 on 0-10 pain scale   assess patient for pain qshift and PRN. encourage movement and stretching    Skin   skin is intact   maintain skin integrity  assess skin qshift and PRN. encourage movement throughout the day.      Discharge  Planning:  Patient plans to d/c home. Patient currently in appeal status due to her expressing she feels as she is not ready for d/c on 9/9.   Team Discussion: Patient has met goals for discharge. Working on complex problem solving. Patient on target to meet rehab goals: yes  *See Care Plan and progress notes for long and short-term goals.   Revisions to Treatment Plan:  Practicing showering and obtaining clothes without assist, small meal prep, etc.   Teaching Needs: Safety, medications, transfers, toileting, etc.   Current Barriers to Discharge: Decreased caregiver support  Possible Resolutions to Barriers: Family education OP follow up services DME: rollator, shower chair     Medical Summary Current Status: hypotension, obestiy, anxiety, left arm pain, heart failure  Barriers to Discharge: Medical stability;Morbid Obesity  Barriers to Discharge Comments: hypotension, obestiy, anxiety, left arm pain, heart failure Possible Resolutions to [employer]: continue to monitor blood pressure, provided dietary education, continue kpad, weaned off O2, daily weights, continue entresto/spironolactone   Continued Need for Acute Rehabilitation Level of Care: The patient requires daily medical management by a physician with specialized training in physical medicine and rehabilitation for the following reasons: Direction of a multidisciplinary physical rehabilitation program to maximize functional independence : Yes Medical management of patient stability for increased activity during participation in an intensive rehabilitation regime.: Yes Analysis  of laboratory values and/or radiology reports with any subsequent need for medication adjustment and/or medical intervention. : Yes   I attest that I was present, lead the team conference, and concur with the assessment and plan of the team.   Chana Bode B 03/03/2023, 2:33 PM

## 2023-03-03 NOTE — Progress Notes (Signed)
Patient ID: Madison West, female   DOB: 12-Sep-1955, 67 y.o.   MRN: [id number]  [facility] orders sent to [facility].

## 2023-03-03 NOTE — Progress Notes (Signed)
Occupational Therapy Session Note  Patient Details  Name: Madison West MRN: [id number] Date of Birth: 1955/07/30  Today's Date: 03/03/2023 OT Individual Time: 6295-2841 OT Individual Time Calculation (min): 25 min    Short Term Goals: Week 2:  OT Short Term Goal 1 (Week 2): Continue progressing towards STG as = LTG due to ELOS  Skilled Therapeutic Interventions/Progress Updates:  Skilled OT intervention completed with focus on self-care and functional endurance. Pt received in standing at sink brushing teeth, agreeable to session. No pain reported.  Pt requested to shower this morning. Pt remained on RA, with O2 sats dropping as low as 82% with mobility/talking, but recovered to > 88% with pursed lip breathing and cues to terminate talking, even with shower session.  Reminders provided for luke warm water and for energy conservation strategies, but overall pt was able to gather all clothes in room, ambulate with rollator, doff all clothing in standing, transfer in/out of shower, bathe all parts at the sit > stand level on BSC, and dress with mod I. No cues needed for safety and body positioning as pt with good awareness and no LOB this session.  Pt left at recliner to finish dressing/hair grooming needs, per mod I clearance in room. All needs met at end of session.   Therapy Documentation Precautions:  Precautions Precautions: Fall Precaution Comments: monitor O2 Restrictions Weight Bearing Restrictions: No RLE Weight Bearing: Weight bearing as tolerated    Therapy/Group: Individual Therapy  Madison Sundstrom E Anesa Fronek, MS, OTR/L  03/03/2023, 8:30 AM

## 2023-03-03 NOTE — Progress Notes (Signed)
PROGRESS NOTE   Subjective/Complaints: No new complaints this morning Hopeful that she can stay here until Friday Worried about d/cing without family support OT started shower for her today and she completed it  ROS: shortness of breath resolved at rest- still present with exertion/while talking, +anxiety, denies chest pain, new motor or sensory changes   Objective:   No results found. Recent Labs    03/01/23 0710  WBC 4.7  HGB 9.6*  HCT 30.4*  PLT 338    Recent Labs    03/01/23 0710  NA 135  K 4.2  CL 99  CO2 27  GLUCOSE 108*  BUN 14  CREATININE 0.94  CALCIUM 8.9     Intake/Output Summary (Last 24 hours) at 03/03/2023 1115 Last data filed at 03/03/2023 0849 Gross per 24 hour  Intake 1293 ml  Output 1350 ml  Net -57 ml        Physical Exam: Vital Signs Blood pressure 102/67, pulse 70, temperature 98.2 F (36.8 C), resp. rate 16, height 5\' 8"  (1.727 m), weight 96.1 kg, SpO2 92%. Gen: no distress, normal appearing, BMI 34.16 Gen: no distress, normal appearing HEENT: oral mucosa pink and moist, NCAT Cardio: Reg rate Chest: normal effort, normal rate of breathing Abd: soft, non-distended Ext: no edema Psych: pleasant, normal affect Skin: intact  Psych: Pleasant, anxious Neuro: AAOx4. No apparent cognitive deficits  Skin: Groin catheterization sites well-healed, without apparent hematoma.  Clean, dry, intact.   GU: continent of bladder Neuro: verbose DTRs: Reflexes were 2+ in bilateral achilles, patella, biceps, BR and triceps. Babinsky: flexor responses b/l.   Hoffmans: negative b/l Sensory exam: revealed normal sensation in all dermatomal regions in bilateral upper extremities and bilateral lower extremities Motor exam: strength 5-/5 throughout bilateral upper extremities and bilateral lower extremities Coordination: Fine motor coordination was normal.  Mild bilateral upper extremity intention  tremor   MSK: Tender to palpation over sternum and left chest, no bruising or apparent lesions  Assessment/Plan: 1. Functional deficits which require 3+ hours per day of interdisciplinary therapy in a comprehensive inpatient rehab setting. Physiatrist is providing close team supervision and 24 hour management of active medical problems listed below. Physiatrist and rehab team continue to assess barriers to discharge/monitor patient progress toward functional and medical goals  Care Tool:  Bathing    Body parts bathed by patient: Right arm, Left arm, Chest, Abdomen, Front perineal area, Buttocks, Right upper leg, Left upper leg, Face, Right lower leg, Left lower leg   Body parts bathed by helper: Right lower leg, Left lower leg     Bathing assist Assist Level: Independent with assistive device     Upper Body Dressing/Undressing Upper body dressing   What is the patient wearing?: Pull over shirt, Bra    Upper body assist Assist Level: Independent    Lower Body Dressing/Undressing Lower body dressing      What is the patient wearing?: Underwear/pull up, Pants     Lower body assist Assist for lower body dressing: Independent with assitive device     Toileting Toileting    Toileting assist Assist for toileting: Independent with assistive device Assistive Device Comment: rollator   Transfers Chair/bed  transfer  Transfers assist     Chair/bed transfer assist level: Independent with assistive device Chair/bed transfer assistive device: Other (rollator)   Locomotion Ambulation   Ambulation assist      Assist level: Independent with assistive device Assistive device: Rollator Max distance: 231ft   Walk 10 feet activity   Assist     Assist level: Independent with assistive device Assistive device: Rollator   Walk 50 feet activity   Assist Walk 50 feet with 2 turns activity did not occur: Safety/medical concerns (fatigue, global  weakness/deconditioning)  Assist level: Independent with assistive device Assistive device: Rollator    Walk 150 feet activity   Assist Walk 150 feet activity did not occur: Safety/medical concerns (fatigue, global weakness/deconditioning)  Assist level: Independent with assistive device Assistive device: Rollator    Walk 10 feet on uneven surface  activity   Assist Walk 10 feet on uneven surfaces activity did not occur: Safety/medical concerns (fatigue, global weakness/deconditioning)   Assist level: Independent with assistive device Assistive device: Rollator   Wheelchair     Assist Is the patient using a wheelchair?: No Type of Wheelchair: Manual Wheelchair activity did not occur: N/A         Wheelchair 50 feet with 2 turns activity    Assist    Wheelchair 50 feet with 2 turns activity did not occur: N/A       Wheelchair 150 feet activity     Assist  Wheelchair 150 feet activity did not occur: N/A       Blood pressure 102/67, pulse 70, temperature 98.2 F (36.8 C), resp. rate 16, height 5\' 8"  (1.727 m), weight 96.1 kg, SpO2 92%.  Medical Problem List and Plan: 1. 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  Current use of Antithrombotics/Antiplatelets: continue Eliquis 5 mg twice daily             -antiplatelet therapy: Aspirin and Plavix. D/c date for aspirin placed for 9/8 as per Dr. Nicki Guadalajara; discussed with patient   3. Pain: Tylenol, oxycodone 5 mg as needed             -continue Robaxin 500 mg BID             -continue gabapentin 300 mg TID             -continue Lidoderm patches 1-2 as needed for pain  -kpad ordered  4. Anxiety:             -continue Buspar 10 mg BID and  -continue Xanax 3 mg BID and once daily  prn             -refusing Prozac 20 mg q HS>>will discontinue             -continue melatonin 3 mg q HS             -antipsychotic agents: n/a   5. Neuropsych/cognition: This patient is capable of making decisions on her own behalf.   6. Skin/Wound Care: Routine skin care checks   7. Fluids/Electrolytes/Nutrition: Routine Is and Os and follow-up chemistries             -hyponatremia>>improving, follow-up BMP   8: Hypertension: monitor TID and prn             -see meds #12   9: Hyperlipidemia: continue statin   10: CAD s/p PCI to RCA 2021; to LAD on 8/07: continue DAPT and statin   11: Aortic stenosis s/p TAVR 8/15: DAPT continues   12: Acute systolic CHF; EF ~40-98%; Lasix discontinued             -daily weight             -continue Farxiga 10 mg daily             -continue spironlactone 25 mg daily             -continue Entresto 49-51 BID (this dose started 8/26)    -9/7-8 weight appears overall stable from prior, weight was 100.1 kg on 9/4.  Continue to monitor  -Recheck CXR  Filed Weights   03/01/23 0500 03/02/23 1030 03/03/23 0805  Weight: 101.9 kg 96.4 kg 96.1 kg      13: Urinary retention: most likely due to constipation and immobility             -Foley now out   14: Refractory VT resolved:  continue amiodarone 200 mg BID   15: Carotid stenosis s/p right CEA 2020             -follow-up with [facility] as per protocol   16: Anemia; acute blood loss/other: follow-up CBC   17: Acute DVT left internal jugular>>resolved on recent venous duplex, superficial cephalic vein thrombosis present             -plan is to continue triple therapy and discontinue aspirin 30 days post-PCI              -continue Eliquis 5 mg BID   18: Hx partial vulvectomies with difficult Foley cath placement:  -urology consulted>>Foley placed; removed 8/27   19: Sepsis due to pneumonia>>Enterobacter isolated, antibiotics completed             -Incentive spirometry every 2 hours while awake, placed nursing order to request that this be placed within reach of patient at all times             -As needed oxygen, wean as tolerated   20: Anemia, multifactorial: Hgb reviewed and is stable, repeat Monday   21: Thrombocytosis: trending upward; follow-up CBC  22. Left pleural effusion: stable on CXR, lasix decreased to 10mg  given milkd AKI daily weights ordered  23. Shortness of breath: improved, increased oxygen to 2L, discussed goal O2 is 88-92%, improved, decreased lasix to 10mg   -9/7 patient had brief decrease of O2 sat to 86 with activity, improved to low  90s shortly after.  O2 nasal cannula as needed started.  -Recheck CXR  24. History of prediabetes: currently well controlled, d/c CBG checks, d/c Ensure between meals, discussed avoiding added sugar  25. Suboptimal vitamin D level: ergocalciferol 50,000U once per week ordered, continue  25. Obesity: d/c Ensure between meals, provided dietary education regarding whole food protein sources. Monitor daily weights  26. AKI: discussed that creatinine has normalized, discussed that AKI could have been caused by Lasix. Repeat creatinine on Monday  27. Hypotension: d/c lasix 10mg , discussed with patient, discussed improvements in BP, continue teds    03/03/2023     8:05 AM 03/03/2023    5:33 AM 03/02/2023    8:20 PM  Vitals with BMI  Weight 211 lbs 14 oz    BMI 32.22    Systolic  102 110  Diastolic  67 55  Pulse  70 73   28.  Insomnia  -ccontinue melatonin, she declines medication change at this time  20. Mixed pattern pulmonary edema: continue lasix 20mg  daily. Weight reviewed and is stable    LOS: 13 days A FACE TO FACE EVALUATION WAS PERFORMED  Madison West P Mariselda Badalamenti 03/03/2023, 11:15 AM

## 2023-03-03 NOTE — Progress Notes (Signed)
Patient ID: Madison West, female   DOB: 12-02-1955, 67 y.o.   MRN: [id number]  Team Conference Report to Patient/Family  Team Conference discussion was reviewed with the patient and caregiver, including goals, any changes in plan of care and target discharge date.  Patient and caregiver express understanding and are in agreement.  The patient has a target discharge date of  (d/c pending appeal).  Sw met with patient and informed patient that currently appeal is still pending. Patient has met all MOD I goals. No additional questions or concerns.  Andria Rhein 03/03/2023, 1:25 PM

## 2023-03-03 NOTE — Progress Notes (Signed)
Physical Therapy Session Note  Patient Details  Name: Madison West MRN: [id number] Date of Birth: 04/01/1956  Today's Date: 03/03/2023 PT Individual Time: 0865-7846 PT Individual Time Calculation (min): 40 min   Short Term Goals: Week 1:  PT Short Term Goal 1 (Week 1): STG=LTG due to LOS  Skilled Therapeutic Interventions/Progress Updates:   Received pt in bathroom - able to perform all bathroom tasks mod I. Pt agreeable to PT treatment and did not report pain during session. Session with emphasis on functional mobility, global strengthening and endurance, ambulation, and dynamic standing balance. Pt performed all transfers with rollator and mod I throughout session. Pt ambulated around room with rollator and mod I - washed hands at sink and changed shirt standing without assist.   Pt ambulated 356ft x 2 trials with rollator and mod I to/from main therapy gym. SPO2 dropped to 88% but increased to 97% with pursed lip breathing. Pt performed standing alternating toe taps to 6in step for 1 minute x 2 trials with supervision - SPO2 dropped to 88% but quickly recovered to 96%. Pt then ambulated additional 130ft without AD and CGA/close supervision - pt ambulates at decreased cadence, lacking heel strike, with minimal arm swing and generalized unsteadiness - SPO2 dropped to 83% but increased to 93%. Returned to room and concluded session with pt sitting in recliner with all needs met. Pt remains mod I in room.   Therapy Documentation Precautions:  Precautions Precautions: Fall Precaution Comments: monitor O2 Restrictions Weight Bearing Restrictions: No RLE Weight Bearing: Weight bearing as tolerated  Therapy/Group: Individual Therapy Marlana Salvage Zaunegger Blima Rich PT, DPT 03/03/2023, 6:51 AM

## 2023-03-03 NOTE — Progress Notes (Signed)
Speech Language Pathology Daily Session Note  Patient Details  Name: Madison West MRN: [id number] Date of Birth: 1956-03-25  Today's Date: 03/03/2023 SLP Individual Time: 1115-1203 SLP Individual Time Calculation (min): 48 min  Short Term Goals: Week 2: SLP Short Term Goal 1 (Week 2): STGs=LTGs due to ELOS  Skilled Therapeutic Interventions:   Pt greeted at bedside. She was awake/alert in her recliner and very agreeable to tx tasks targeting cognition. She was able to independently recall details re SLPs from prev tx sessions as well as recent events from last night/this morning w/ 100% accuracy. Additionally, she presented w/ adequate reasoning, problem solving, and planning during conversational task re d/c appeal and preferred d/c plan. She was verbose throughout verbal tasks. However, suspect re to baseline pt personality vs true attention deficits given topic maintenance. She was left in her recliner with the alarm set and call light within reach. Recommend cont ST per POC for remainder of this admission, though pt will not require additional ST services upon d/c.   Pain Pain Assessment Pain Scale: 0-10 Pain Score: 6  Pain Location: Chest Pain Descriptors / Indicators: Aching Pain Intervention(s): Medication (See eMAR)  Therapy/Group: Individual Therapy  Pati Gallo 03/03/2023, 4:50 PM

## 2023-03-04 MED ORDER — POLYETHYLENE GLYCOL 3350 17 G PO PACK
17.0000 g | PACK | Freq: Every day | ORAL | Status: DC
  Filled 2023-03-04: qty 1

## 2023-03-04 MED ORDER — FUROSEMIDE 20 MG PO TABS
10.0000 mg | ORAL_TABLET | Freq: Every day | ORAL | Status: DC
  Administered 2023-03-05: 10 mg via ORAL
  Filled 2023-03-04: qty 1

## 2023-03-04 NOTE — Progress Notes (Signed)
Physical Therapy Session Note  Patient Details  Name: Madison West MRN: [id number] Date of Birth: [birth year]  Today's Date: 03/04/2023 PT Individual Time: 1400-1427 PT Individual Time Calculation (min): 27 min   Short Term Goals: Week 1:  PT Short Term Goal 1 (Week 1): STG=LTG due to LOS  Skilled Therapeutic Interventions/Progress Updates:      Pt sitting in recliner to start - agreeable to therapy session. No reports of pain. Completes functional mobility at mod I level with the rollator and appropriate safety awareness with brakes and management in tight spaces. Ambulated from her room downstairs and then outside to address community mobility. Seated rest break outdoors and discussed DC plan, progression, home safety, and fall prevention. Ambulated back upstairs to her room and concluded session seated in recliner with all needs met.    Therapy Documentation Precautions:  Precautions Precautions: Fall Precaution Comments: monitor O2 Restrictions Weight Bearing Restrictions: No RLE Weight Bearing: Weight bearing as tolerated General:    Therapy/Group: Individual Therapy  Orrin Brigham 03/04/2023, 12:56 PM

## 2023-03-04 NOTE — Progress Notes (Signed)
Patient ID: Madison West, female   DOB: 08-21-1955, 67 y.o.   MRN: [id number]  Sw received formal notification that Medicare agrees with current d/c plan. Patient appeal denied. Patient plans to place an expedited reconsideration today by 12 PM. Determination will be made within 72 hours.  Patient had expressed she plans to remain on CIR until determination of reconsideration has been made.   SW confirmed the phone number of [phone] and informed patient to submit by 12 PM today. Patient plans to have a friend assist with the reconsideration.  No additional questions or concerns.

## 2023-03-04 NOTE — Progress Notes (Signed)
Occupational Therapy Session Note  Patient Details  Name: Madison West MRN: [id number] Date of Birth: 11/29/1955  Today's Date: 03/04/2023 OT Individual Time: 0900-0930 OT Individual Time Calculation (min): 30 min    Short Term Goals: Week 2:  OT Short Term Goal 1 (Week 2): Continue progressing towards STG as = LTG due to ELOS  Skilled Therapeutic Interventions/Progress Updates:    Pt up in room upon arrival. OT intervention with focus on ongoing discharge planning and safety awareness to prepare for pending discharge. Amb in room with Rollator. Pt mod I in room. Reviewed home safety and importance of establishing schedule at home. Pt reamined seated in recliner. All needs within reach.   Therapy Documentation Precautions:  Precautions Precautions: Fall Precaution Comments: monitor O2 Restrictions Weight Bearing Restrictions: No RLE Weight Bearing: Weight bearing as tolerated Pain: Pt denies pain this morning Rich Brave 03/04/2023, 10:47 AM

## 2023-03-04 NOTE — Progress Notes (Signed)
PROGRESS NOTE   Subjective/Complaints: No new complaints this morning 6/10 chest pain She received a denial on her appeal and plans to apply to the reconsideration department  ROS: shortness of breath resolved at rest- still present with exertion/while talking, +anxiety, + musculoskeletal chest pain, new motor or sensory changes   Objective:   No results found. No results for input(s): "WBC", "HGB", "HCT", "PLT" in the last 72 hours.   No results for input(s): "NA", "K", "CL", "CO2", "GLUCOSE", "BUN", "CREATININE", "CALCIUM" in the last 72 hours.    Intake/Output Summary (Last 24 hours) at 03/04/2023 0950 Last data filed at 03/04/2023 0700 Gross per 24 hour  Intake 1060 ml  Output 1450 ml  Net -390 ml        Physical Exam: Vital Signs Blood pressure 118/73, pulse 73, temperature 98 F (36.7 C), temperature source Oral, resp. rate 20, height 5\' 8"  (1.727 m), weight 98.3 kg, SpO2 92%. Gen: no distress, normal appearing, BMI 32.95 Gen: no distress, normal appearing HEENT: oral mucosa pink and moist, NCAT Cardio: Reg rate, +chostochondritis Chest: normal effort, normal rate of breathing Abd: soft, non-distended Ext: no edema Psych: Pleasant, anxious Neuro: AAOx4. No apparent cognitive deficits  Skin: Groin catheterization sites well-healed, without apparent hematoma.  Clean, dry, intact.   GU: continent of bladder Neuro: verbose DTRs: Reflexes were 2+ in bilateral achilles, patella, biceps, BR and triceps. Babinsky: flexor responses b/l.   Hoffmans: negative b/l Sensory exam: revealed normal sensation in all dermatomal regions in bilateral upper extremities and bilateral lower extremities Motor exam: strength 5-/5 throughout bilateral upper extremities and bilateral lower extremities Coordination: Fine motor coordination was normal.  Mild bilateral upper extremity intention tremor   MSK: Tender to palpation over  sternum and left chest, no bruising or apparent lesions  Assessment/Plan: 1. Functional deficits which require 3+ hours per day of interdisciplinary therapy in a comprehensive inpatient rehab setting. Physiatrist is providing close team supervision and 24 hour management of active medical problems listed below. Physiatrist and rehab team continue to assess barriers to discharge/monitor patient progress toward functional and medical goals  Care Tool:  Bathing    Body parts bathed by patient: Right arm, Left arm, Chest, Abdomen, Front perineal area, Buttocks, Right upper leg, Left upper leg, Face, Right lower leg, Left lower leg   Body parts bathed by helper: Right lower leg, Left lower leg     Bathing assist Assist Level: Independent with assistive device     Upper Body Dressing/Undressing Upper body dressing   What is the patient wearing?: Pull over shirt, Bra    Upper body assist Assist Level: Independent    Lower Body Dressing/Undressing Lower body dressing      What is the patient wearing?: Underwear/pull up, Pants     Lower body assist Assist for lower body dressing: Independent with assitive device     Toileting Toileting    Toileting assist Assist for toileting: Independent with assistive device Assistive Device Comment: rollator   Transfers Chair/bed transfer  Transfers assist     Chair/bed transfer assist level: Independent with assistive device Chair/bed transfer assistive device: Other (rollator)   Locomotion Ambulation   Ambulation assist  Assist level: Independent with assistive device Assistive device: Rollator Max distance: 231ft   Walk 10 feet activity   Assist     Assist level: Independent with assistive device Assistive device: Rollator   Walk 50 feet activity   Assist Walk 50 feet with 2 turns activity did not occur: Safety/medical concerns (fatigue, global weakness/deconditioning)  Assist level: Independent with  assistive device Assistive device: Rollator    Walk 150 feet activity   Assist Walk 150 feet activity did not occur: Safety/medical concerns (fatigue, global weakness/deconditioning)  Assist level: Independent with assistive device Assistive device: Rollator    Walk 10 feet on uneven surface  activity   Assist Walk 10 feet on uneven surfaces activity did not occur: Safety/medical concerns (fatigue, global weakness/deconditioning)   Assist level: Independent with assistive device Assistive device: Rollator   Wheelchair     Assist Is the patient using a wheelchair?: No Type of Wheelchair: Manual Wheelchair activity did not occur: N/A         Wheelchair 50 feet with 2 turns activity    Assist    Wheelchair 50 feet with 2 turns activity did not occur: N/A       Wheelchair 150 feet activity     Assist  Wheelchair 150 feet activity did not occur: N/A       Blood pressure 118/73, pulse 73, temperature 98 F (36.7 C), temperature source Oral, resp. rate 20, height 5\' 8"  (1.727 m), weight 98.3 kg, SpO2 92%.  Medical Problem List and Plan: 1. 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   Messaged team to discuss that patient does not feel ready to d/c Monday, to evaluate whether there is any justification to extend her stay further ot whether she will achieve modI goals by Monday, discussed with patient that she has the right to appeal discharge if she would like  -Patient still very anxious about her discharge Monday, she would like to stay till Friday when her daughter will be at her house  Discussed patient's plan to appear discharge today  Made modI in  room  Appreciate SW checking status of appeal  Changed to every day since she has met her therapy goals in PT  Discussed her plan to submit appeal to the reconsiderations department  2.  Current use of Antithrombotics/Antiplatelets: continue Eliquis 5 mg twice daily             -antiplatelet therapy: Aspirin and Plavix. D/c date for aspirin placed for 9/8 as per Dr. Nicki Guadalajara; discussed with patient   3. Pain: Tylenol, oxycodone 5 mg as needed             -continue Robaxin 500 mg BID             -continue gabapentin 300 mg TID             -continue Lidoderm patches 1-2 as needed for pain  -kpad ordered   4. Anxiety:             -continue Buspar 10 mg BID  and  -continue Xanax 3 mg BID and once daily  prn             -refusing Prozac 20 mg q HS>>will discontinue             -continue melatonin 3 mg q HS             -antipsychotic agents: n/a   5. Neuropsych/cognition: This patient is capable of making decisions on her own behalf.   6. Skin/Wound Care: Routine skin care checks   7. Fluids/Electrolytes/Nutrition: Routine Is and Os and follow-up chemistries             -hyponatremia>>improving, follow-up BMP   8: Hypertension: monitor TID and prn             -see meds #12   9: Hyperlipidemia: continue statin   10: CAD s/p PCI to RCA 2021; to LAD on 8/07: continue DAPT and statin   11: Aortic stenosis s/p TAVR 8/15: DAPT continues   12: Acute systolic CHF; EF ~62-13%; Lasix discontinued             -daily weight             -continue Farxiga 10 mg daily             -continue spironlactone 25 mg daily             -continue Entresto 49-51 BID (this dose started 8/26)    -9/7-8 weight appears overall stable from prior, weight was 100.1 kg on 9/4.  Continue to monitor  -Recheck CXR  Filed Weights   03/02/23 1030 03/03/23 0805 03/04/23 0600  Weight: 96.4 kg 96.1 kg 98.3 kg      13: Urinary retention: most likely due to constipation and immobility             -Foley now  out   14: Refractory VT resolved: continue amiodarone 200 mg BID   15: Carotid stenosis s/p right CEA 2020             -follow-up with [facility] as per protocol   16: Anemia; acute blood loss/other: follow-up CBC   17: Acute DVT left internal jugular>>resolved on recent venous duplex, superficial cephalic vein thrombosis present             -plan is to continue triple therapy and discontinue aspirin 30 days post-PCI              -continue Eliquis 5 mg BID   18: Hx partial vulvectomies with difficult Foley cath placement:  -urology consulted>>Foley placed; removed 8/27   19: Sepsis due to pneumonia>>Enterobacter isolated, antibiotics completed             -Incentive spirometry every 2 hours while awake, placed nursing order to request that this be placed within reach of patient at all times             -As needed oxygen, wean as tolerated   20: Anemia, multifactorial: Hgb reviewed and is stable, repeat Monday   21: Thrombocytosis: trending upward; follow-up CBC  22. Left pleural effusion: stable on CXR, lasix decreased to 10mg  given milkd AKI daily weights ordered, weight reviewed and is decreased  23. Shortness of breath: improved, increased oxygen to 2L, discussed goal O2 is 88-92%, improved, decreased lasix to 10mg   -9/7 patient had brief decrease of O2 sat to 86 with activity, improved to low 90s shortly after.  O2 nasal cannula as needed started.  -Recheck CXR  24. History of prediabetes: currently well controlled, d/c CBG checks, d/c Ensure between meals, discussed avoiding added sugar  25. Suboptimal vitamin D level: ergocalciferol 50,000U once per week ordered, continue  25. Obesity: d/c Ensure between meals, provided dietary education regarding whole food protein sources. Monitor daily weights  26. AKI: discussed that creatinine has normalized, discussed that AKI could have been caused by Lasix. Repeat creatinine on Monday  27. Hypotension: decrease lasix to 10mg  daily,  discussed with patient, discussed improvements in BP, continue teds    03/04/2023    7:59 AM 03/04/2023    6:00 AM 03/04/2023    4:19 AM  Vitals with BMI  Weight  216 lbs 11 oz   BMI  32.96   Systolic 118  95  Diastolic 73  50  Pulse 73  68   28.  Insomnia  -ccontinue melatonin, she declines medication change at this time  20. Mixed pattern pulmonary edema: decrease lasix to 20mg  daily. Weight reviewed and is stable  >50 minutes spent in discussion of her hypotension, decreasing her lasix to 10mg , that weight is down, discussed her plan to submit appeal to the reconsiderations department   LOS: 14 days A FACE TO FACE EVALUATION WAS PERFORMED  Drema Pry Cedra Villalon 03/04/2023, 9:50 AM

## 2023-03-04 NOTE — Progress Notes (Signed)
Speech Language Pathology Daily Session Note  Patient Details  Name: Terin Kahrs MRN: [id number] Date of Birth: Jan 11, 1956  Today's Date: 03/04/2023 SLP Individual Time: 1035-1100 SLP Individual Time Calculation (min): 25 min  Short Term Goals: Week 2: SLP Short Term Goal 1 (Week 2): STGs=LTGs due to ELOS  Skilled Therapeutic Interventions: Skilled treatment session focused on cognitive goals. Upon arrival, patient was on the phone and trying to arrange for a friend to send in information for her appeal.  Patient appeared anxious throughout conversation and consistently repeated information with Max verbal cues needed for topic maintenance. Patient's information also appeared disorganized and missing pertinent information. SLP provided assist to self-correct and suggested that the patient scan her information and then send it to her friend via email.  Patient ambulated to the copier with extra time and required Min verbal cues for problem solving during scanning task. Patient left upright in recliner with all needs within reach. Continue with current plan of care.      Pain Pain Assessment Pain Scale: 0-10 Pain Score: 6  Pain Type: Acute pain Pain Location: Back Pain Orientation: Mid Pain Descriptors / Indicators: Aching Pain Frequency: Constant Pain Onset: Gradual Patients Stated Pain Goal: 2 Pain Intervention(s): Medication (See eMAR)  Therapy/Group: Individual Therapy  Akiya Morr 03/04/2023, 12:40 PM

## 2023-03-05 ENCOUNTER — Other Ambulatory Visit (HOSPITAL_COMMUNITY): Payer: Self-pay

## 2023-03-05 MED ORDER — MELATONIN 3 MG PO TABS: 3.0000 mg | ORAL_TABLET | Freq: Every day | ORAL | Status: DC

## 2023-03-05 MED ORDER — APIXABAN 5 MG PO TABS: 5.0000 mg | ORAL_TABLET | Freq: Two times a day (BID) | ORAL | 0 refills | Status: DC

## 2023-03-05 MED ORDER — POLYETHYLENE GLYCOL 3350 17 G PO PACK: 17.0000 g | PACK | Freq: Every day | ORAL | Status: DC

## 2023-03-05 MED ORDER — DOCUSATE SODIUM 100 MG PO CAPS: 100.0000 mg | ORAL_CAPSULE | Freq: Two times a day (BID) | ORAL | Status: DC

## 2023-03-05 MED ORDER — GABAPENTIN 300 MG PO CAPS: 300.0000 mg | ORAL_CAPSULE | Freq: Three times a day (TID) | ORAL | 0 refills | Status: DC

## 2023-03-05 MED ORDER — DM-GUAIFENESIN ER 30-600 MG PO TB12: 1.0000 | ORAL_TABLET | Freq: Two times a day (BID) | ORAL | Status: DC

## 2023-03-05 MED ORDER — FUROSEMIDE 20 MG PO TABS: 10.0000 mg | ORAL_TABLET | Freq: Every day | ORAL | 0 refills | Status: DC

## 2023-03-05 MED ORDER — EMPAGLIFLOZIN 10 MG PO TABS: 10.0000 mg | ORAL_TABLET | Freq: Every day | ORAL | 2 refills | Status: DC

## 2023-03-05 MED ORDER — VITAMIN D (ERGOCALCIFEROL) 1.25 MG (50000 UNIT) PO CAPS: 50000.0000 [IU] | ORAL_CAPSULE | ORAL | 0 refills | Status: DC

## 2023-03-05 MED ORDER — OXYCODONE HCL 5 MG PO TABS: 5.0000 mg | ORAL_TABLET | ORAL | 0 refills | Status: DC | PRN

## 2023-03-05 MED ORDER — CLOPIDOGREL BISULFATE 75 MG PO TABS: 75.0000 mg | ORAL_TABLET | Freq: Every day | ORAL | 0 refills | Status: DC

## 2023-03-05 MED ORDER — SPIRONOLACTONE 25 MG PO TABS: 12.5000 mg | ORAL_TABLET | Freq: Every day | ORAL | 0 refills | Status: DC

## 2023-03-05 MED ORDER — SACUBITRIL-VALSARTAN 49-51 MG PO TABS: 1.0000 | ORAL_TABLET | Freq: Two times a day (BID) | ORAL | 0 refills | Status: DC

## 2023-03-05 MED ORDER — AMIODARONE HCL 200 MG PO TABS: 200.0000 mg | ORAL_TABLET | Freq: Two times a day (BID) | ORAL | 0 refills | Status: DC

## 2023-03-05 MED ORDER — DAPAGLIFLOZIN PROPANEDIOL 10 MG PO TABS: 10.0000 mg | ORAL_TABLET | Freq: Every day | ORAL | 0 refills | Status: DC

## 2023-03-05 MED ORDER — METHOCARBAMOL 500 MG PO TABS: 500.0000 mg | ORAL_TABLET | Freq: Two times a day (BID) | ORAL | 0 refills | Status: DC

## 2023-03-05 MED ORDER — BUSPIRONE HCL 10 MG PO TABS: 10.0000 mg | ORAL_TABLET | Freq: Two times a day (BID) | ORAL | 0 refills | Status: DC

## 2023-03-05 MED ORDER — ACETAMINOPHEN 325 MG PO TABS: 650.0000 mg | ORAL_TABLET | Freq: Four times a day (QID) | ORAL | Status: AC

## 2023-03-05 MED ORDER — DAPAGLIFLOZIN PROPANEDIOL 10 MG PO TABS: 10.0000 mg | ORAL_TABLET | Freq: Every day | ORAL | 2 refills | Status: DC

## 2023-03-05 MED ORDER — LIDOCAINE 5 % EX PTCH: 2.0000 | MEDICATED_PATCH | Freq: Every day | CUTANEOUS | Status: DC | PRN

## 2023-03-05 MED ORDER — ROSUVASTATIN CALCIUM 20 MG PO TABS: 20.0000 mg | ORAL_TABLET | Freq: Every day | ORAL | 0 refills | Status: DC

## 2023-03-05 NOTE — Progress Notes (Signed)
Patient ID: Madison West, female   DOB: 04-23-1956, 67 y.o.   MRN: [id number]  Per Dr. Carlis Abbott, patient plans to d/c home today.

## 2023-03-05 NOTE — Plan of Care (Signed)
Continue care plan

## 2023-03-05 NOTE — Plan of Care (Signed)
  Problem: RH Problem Solving Goal: LTG Patient will demonstrate problem solving for (SLP) Description: LTG:  Patient will demonstrate problem solving for basic/complex daily situations with cues with modified independence  (SLP) Outcome: Completed/Met   Problem: RH Memory Goal: LTG Patient will use memory compensatory aids to (SLP) Description: LTG:  Patient will use memory compensatory aids to recall biographical/new, daily complex information with cues (SLP) Outcome: Completed/Met   Problem: RH Attention Goal: LTG Patient will demonstrate this level of attention during functional activites (SLP) Description: LTG:  Patient will will demonstrate this level of attention during functional activities with modified independence (SLP) Outcome: Completed/Met

## 2023-03-05 NOTE — Progress Notes (Signed)
PROGRESS NOTE   Subjective/Complaints: No new complaints this morning  Stable for d/c home Discussed copay for Farxiga for 105$, patient prefers this to Jardiance  ROS: shortness of breath resolved at rest- still present with exertion/while talking, +anxiety- improved, + musculoskeletal chest pain, new motor or sensory changes   Objective:   No results found. No results for input(s): "WBC", "HGB", "HCT", "PLT" in the last 72 hours.   No results for input(s): "NA", "K", "CL", "CO2", "GLUCOSE", "BUN", "CREATININE", "CALCIUM" in the last 72 hours.    Intake/Output Summary (Last 24 hours) at 03/05/2023 1249 Last data filed at 03/05/2023 0734 Gross per 24 hour  Intake 640 ml  Output --  Net 640 ml        Physical Exam: Vital Signs Blood pressure 95/61, pulse 71, temperature 98.1 F (36.7 C), resp. rate 16, height 5\' 8"  (1.727 m), weight 100.2 kg, SpO2 93%. Gen: no distress, normal appearing, BMI 32.95 Gen: no distress, normal appearing HEENT: oral mucosa pink and moist, NCAT Cardio: Reg rate, +chostochondritis Chest: normal effort, normal rate of breathing Abd: soft, non-distended Ext: no edema Psych: Pleasant, anxious, verbose Neuro: AAOx4. No apparent cognitive deficits  Skin: Groin catheterization sites well-healed, without apparent hematoma.  Clean, dry, intact.   GU: continent of bladder Neuro: verbose DTRs: Reflexes were 2+ in bilateral achilles, patella, biceps, BR and triceps. Babinsky: flexor responses b/l.   Hoffmans: negative b/l Sensory exam: revealed normal sensation in all dermatomal regions in bilateral upper extremities and bilateral lower extremities Motor exam: strength 5-/5 throughout bilateral upper extremities and bilateral lower extremities Coordination: Fine motor coordination was normal.  Mild bilateral upper extremity intention tremor   MSK: Tender to palpation over sternum and left  chest, no bruising or apparent lesions  Assessment/Plan: 1. Functional deficits which require 3+ hours per day of interdisciplinary therapy in a comprehensive inpatient rehab setting. Physiatrist is providing close team supervision and 24 hour management of active medical problems listed below. Physiatrist and rehab team continue to assess barriers to discharge/monitor patient progress toward functional and medical goals  Care Tool:  Bathing    Body parts bathed by patient: Right arm, Left arm, Chest, Abdomen, Front perineal area, Buttocks, Right upper leg, Left upper leg, Face, Right lower leg, Left lower leg   Body parts bathed by helper: Right lower leg, Left lower leg     Bathing assist Assist Level: Independent with assistive device     Upper Body Dressing/Undressing Upper body dressing   What is the patient wearing?: Pull over shirt, Bra    Upper body assist Assist Level: Independent    Lower Body Dressing/Undressing Lower body dressing      What is the patient wearing?: Underwear/pull up, Pants     Lower body assist Assist for lower body dressing: Independent with assitive device     Toileting Toileting    Toileting assist Assist for toileting: Independent with assistive device Assistive Device Comment: rollator   Transfers Chair/bed transfer  Transfers assist     Chair/bed transfer assist level: Independent with assistive device Chair/bed transfer assistive device: Other (rollator)   Locomotion Ambulation   Ambulation assist  Assist level: Independent with assistive device Assistive device: Rollator Max distance: 256ft   Walk 10 feet activity   Assist     Assist level: Independent with assistive device Assistive device: Rollator   Walk 50 feet activity   Assist Walk 50 feet with 2 turns activity did not occur: Safety/medical concerns (fatigue, global weakness/deconditioning)  Assist level: Independent with assistive  device Assistive device: Rollator    Walk 150 feet activity   Assist Walk 150 feet activity did not occur: Safety/medical concerns (fatigue, global weakness/deconditioning)  Assist level: Independent with assistive device Assistive device: Rollator    Walk 10 feet on uneven surface  activity   Assist Walk 10 feet on uneven surfaces activity did not occur: Safety/medical concerns (fatigue, global weakness/deconditioning)   Assist level: Independent with assistive device Assistive device: Rollator   Wheelchair     Assist Is the patient using a wheelchair?: No Type of Wheelchair: Manual Wheelchair activity did not occur: N/A         Wheelchair 50 feet with 2 turns activity    Assist    Wheelchair 50 feet with 2 turns activity did not occur: N/A       Wheelchair 150 feet activity     Assist  Wheelchair 150 feet activity did not occur: N/A       Blood pressure 95/61, pulse 71, temperature 98.1 F (36.7 C), resp. rate 16, height 5\' 8"  (1.727 m), weight 100.2 kg, SpO2 93%.  Medical Problem List and Plan: 1. 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   Messaged team to discuss that patient does not feel ready to d/c Monday, to evaluate whether there is any justification to extend her stay further ot whether she will achieve modI goals by Monday, discussed with patient that she has the right to appeal discharge if she would like  -Patient still very anxious about her discharge Monday, she would like to stay till Friday when her daughter will be at her house  Discussed patient's plan to appear discharge today  Made modI in room  Appreciate SW checking status of  appeal  Changed to every day since she has met her therapy goals in PT  Discussed her plan to submit appeal to the reconsiderations department  D/c home today  2.  Current use of Antithrombotics/Antiplatelets: continue Eliquis 5 mg twice daily             -antiplatelet therapy: Aspirin and Plavix. D/c date for aspirin placed for 9/8 as per Dr. Nicki Guadalajara; discussed with patient   3. Pain: Tylenol, oxycodone 5 mg as needed             -continue Robaxin 500 mg BID             -continue gabapentin 300 mg TID             -continue Lidoderm patches 1-2 as needed for pain  -kpad ordered   4. Anxiety:             -continue Buspar 10 mg  BID and  -continue Xanax 3 mg BID and once daily  prn             -refusing Prozac 20 mg q HS>>will discontinue             -continue melatonin 3 mg q HS             -antipsychotic agents: n/a   5. Neuropsych/cognition: This patient is capable of making decisions on her own behalf.   6. Skin/Wound Care: Routine skin care checks   7. Fluids/Electrolytes/Nutrition: Routine Is and Os and follow-up chemistries             -hyponatremia>>improving, follow-up BMP   8: Hypertension: resolved, now hypotensive, continue to monitor BP at home   9: Hyperlipidemia: continue statin   10: CAD s/p PCI to RCA 2021; to LAD on 8/07: continue DAPT and statin   11: Aortic stenosis s/p TAVR 8/15: DAPT continues   12: Acute systolic CHF; EF ~76-28%; Lasix discontinued             -daily weight             -continue Farxiga 10 mg daily, discussed copay and patient can afford this             -continue spironlactone 25 mg daily             -continue Entresto 49-51 BID (this dose started 8/26)    -9/7-8 weight appears overall stable from prior, weight was 100.1 kg on 9/4.  Continue to monitor  -Recheck CXR  Filed Weights   03/03/23 0805 03/04/23 0600 03/05/23 0500  Weight: 96.1 kg 98.3 kg 100.2 kg      13: Urinary retention: most likely due to constipation and  immobility             -Foley now out   14: Refractory VT resolved: continue amiodarone 200 mg BID   15: Carotid stenosis s/p right CEA 2020             -follow-up with [facility] as per protocol   16: Anemia; acute blood loss/other: follow-up CBC   17: Acute DVT left internal jugular>>resolved on recent venous duplex, superficial cephalic vein thrombosis present             -plan is to continue triple therapy and discontinue aspirin 30 days post-PCI              -continue Eliquis 5 mg BID   18: Hx partial vulvectomies with difficult Foley cath placement:  -urology consulted>>Foley placed; removed 8/27   19: Sepsis due to pneumonia>>Enterobacter isolated, antibiotics completed             -Incentive spirometry every 2 hours while awake, placed nursing order to request that this be placed within reach of patient at all times             -As needed oxygen, wean as tolerated   20: Anemia, multifactorial: Hgb reviewed and is stable, repeat Monday   21: Thrombocytosis: trending upward; follow-up CBC  22. Left pleural effusion: stable on CXR, lasix decreased to 10mg  given milkd AKI daily weights ordered, weight reviewed and is decreased  23. Shortness of breath: improved, increased oxygen to 2L, discussed goal O2 is 88-92%, improved, decreased lasix to 10mg   -9/7 patient had brief decrease of O2 sat to 86 with activity, improved to low 90s shortly after.  O2 nasal cannula as needed started.  -Recheck CXR  24. History  of prediabetes: currently well controlled, d/c CBG checks, d/c Ensure between meals, discussed avoiding added sugar  25. Suboptimal vitamin D level: ergocalciferol 50,000U once per week ordered, continue  25. Obesity: d/c Ensure between meals, provided dietary education regarding whole food protein sources. Monitor daily weights  26. AKI: discussed that creatinine has normalized, discussed that AKI could have been caused by Lasix. Repeat creatinine on Monday  27. Hypotension:  decrease lasix to 10mg  daily, discussed with patient, discussed improvements in BP, continue teds    03/05/2023    5:00 AM 03/05/2023    4:12 AM 03/04/2023    7:26 PM  Vitals with BMI  Weight 220 lbs 14 oz    BMI 33.6    Systolic  95 112  Diastolic  61 71  Pulse  71 71   28.  Insomnia  -ccontinue melatonin, she declines medication change at this time  20. Mixed pattern pulmonary edema: decrease lasix to 20mg  daily. Weight reviewed and is stable   >30 minutes spent in discharge of patient including review of medications and follow-up appointments, physical examination, and in answering all patient's questions   LOS: 15 days A FACE TO FACE EVALUATION WAS PERFORMED  Drema Pry Tymel Conely 03/05/2023, 12:49 PM

## 2023-03-05 NOTE — Plan of Care (Signed)
  Problem: RH Balance Goal: LTG Patient will maintain dynamic standing with ADLs (OT) Description: LTG:  Patient will maintain dynamic standing balance with assist during activities of daily living (OT)  Outcome: Completed/Met   Problem: Sit to Stand Goal: LTG:  Patient will perform sit to stand in prep for activites of daily living with assistance level (OT) Description: LTG:  Patient will perform sit to stand in prep for activites of daily living with assistance level (OT) Outcome: Completed/Met   Problem: RH Bathing Goal: LTG Patient will bathe all body parts with assist levels (OT) Description: LTG: Patient will bathe all body parts with assist levels (OT) Outcome: Completed/Met   Problem: RH Dressing Goal: LTG Patient will perform upper body dressing (OT) Description: LTG Patient will perform upper body dressing with assist, with/without cues (OT). Outcome: Completed/Met Goal: LTG Patient will perform lower body dressing w/assist (OT) Description: LTG: Patient will perform lower body dressing with assist, with/without cues in positioning using equipment (OT) Outcome: Completed/Met   Problem: RH Toileting Goal: LTG Patient will perform toileting task (3/3 steps) with assistance level (OT) Description: LTG: Patient will perform toileting task (3/3 steps) with assistance level (OT)  Outcome: Completed/Met   Problem: RH Simple Meal Prep Goal: LTG Patient will perform simple meal prep w/assist (OT) Description: LTG: Patient will perform simple meal prep with assistance, with/without cues (OT). Outcome: Completed/Met   Problem: RH Toilet Transfers Goal: LTG Patient will perform toilet transfers w/assist (OT) Description: LTG: Patient will perform toilet transfers with assist, with/without cues using equipment (OT) Outcome: Completed/Met   Problem: RH Tub/Shower Transfers Goal: LTG Patient will perform tub/shower transfers w/assist (OT) Description: LTG: Patient will perform  tub/shower transfers with assist, with/without cues using equipment (OT) Outcome: Completed/Met   Problem: RH Awareness Goal: LTG: Patient will demonstrate awareness during functional activites type of (OT) Description: LTG: Patient will demonstrate awareness during functional activites type of (OT) Outcome: Completed/Met

## 2023-03-05 NOTE — Progress Notes (Signed)
Occupational Therapy Session Note  Patient Details  Name: Madison West MRN: [id number] Date of Birth: 08/21/1955  Today's Date: 03/05/2023 OT Individual Time: 6606-3016 OT Individual Time Calculation (min): 25 min    Short Term Goals: Week 2:  OT Short Term Goal 1 (Week 2): Continue progressing towards STG as = LTG due to ELOS  Skilled Therapeutic Interventions/Progress Updates:  Skilled OT intervention completed with focus on cardiovascular and respiratory endurance. Pt received mobilizing around room, agreeable to session. No pain reported.  Completed all sit > stands, ambulatory mobility with rollator at mod I level.  Ambulated > gym. O2 sats dropped to 82% on RA with exertion but able to recover to > 90% with pursed lip breathing/cessation of talking.  Pt completed the following intervals on nustep to promote global/cardiovascular endurance needed for independence with BADLs and functional mobility: -10 mins, level 3  Ambulated back to room, where pt was tangential about how her appeal was denied and discussed her anxiety about going home. Encouraged pt that she has been mod I in room all week and has practiced meal prep and toileting (everything she will do at home) without any trouble. Encouraged pt to use calming techniques in home if she gets nervous being alone.   Pt remained seated in recliner with all needs in reach at end of session.   Therapy Documentation Precautions:  Precautions Precautions: Fall Precaution Comments: monitor O2 Restrictions Weight Bearing Restrictions: No     Therapy/Group: Individual Therapy  Melvyn Novas, MS, OTR/L  03/05/2023, 11:10 AM

## 2023-03-05 NOTE — Progress Notes (Signed)
Physical Therapy Session Note  Patient Details  Name: Madison West MRN: [id number] Date of Birth: Jun 11, 1956  Today's Date: 03/05/2023 PT Individual Time: 8315-1761 PT Individual Time Calculation (min): 25 min   Short Term Goals: Week 1:  PT Short Term Goal 1 (Week 1): STG=LTG due to LOS  Skilled Therapeutic Interventions/Progress Updates:   Received pt ambulating independently around room packing up to go home. Pt on phone with daughter, planning to make her way to the hospital to pick pt up. Pt agreeable to PT treatment and did not state pain level during session. Pt performed all transfers with rollator and mod I throughout session. Pt ambulated >354ft x 2 trials with rollator and mod I to/from dayroom. Pt performed BUE/BLE strengthening on Nustep at workload 4 for 8 minutes for a total of 374 steps with emphasis on BUE/LE strengthening - SPO2 dropped to 87% but increased with rest and cues for pursed lip breathing. Pt reports feeling confident going home today vs earlier this week. Returned to room and concluded session with pt sitting in recliner with all needs within reach.   Therapy Documentation Precautions:  Precautions Precautions: Fall Precaution Comments: monitor O2 Restrictions Weight Bearing Restrictions: No RLE Weight Bearing: Weight bearing as tolerated  Therapy/Group: Individual Therapy Marlana Salvage Zaunegger Blima Rich PT, DPT 03/05/2023, 7:20 AM

## 2023-03-05 NOTE — Progress Notes (Signed)
Speech Language Pathology Discharge Summary  Patient Details  Name: Madison West MRN: [id number] Date of Birth: March 01, 1956  Date of Discharge from SLP service:March 05, 2023  Today's Date: 03/05/2023 SLP Individual Time: 1200-1220 SLP Individual Time Calculation (min): 20 min   Skilled Therapeutic Interventions:  Skilled treatment session focused on cognitive goals. This was the 2nd attempt this SLP made to see patient as patient was on the phone with her insurance during the time scheduled for the initial session. SLP administered the [facility] Mental Status Examination (SLUMS). Patient scored  26/30 points with a score of 27 or above considered normal with ongoing mild deficits in short-term memory noted.  SLP provided education regarding results of assessment and importance of utilizing memory compensatory strategies for recall and carryover of daily information. She verbalized understanding. Patient left upright in recliner.     Patient has met 3 of 3 long term goals.  Patient to discharge at overall Modified Independent-Supervision level.   Reasons goals not met: N/A   Clinical Impression/Discharge Summary: Patient has made functional gains and has met 3 of 3 LTGs this admission. Currently, patient is overall Mod I for problem solving and requires supervision level verbal cues for recall of daily information with use of memory compensatory strategies. Patient remains verbose which can cause decreased attention to tasks, however, suspect this is baseline and may be exacerbated by current anxiety. Patient education is complete and patient will discharge home with intermittent supervision from family. Patient would benefit from f/u SLP services to maximize her cognitive functioning and overall functional independence.   Care Partner:  Caregiver Able to Provide Assistance: Yes  Type of Caregiver Assistance: Physical;Cognitive  Recommendation:  Outpatient SLP  (Intermittent Supervision)  Rationale for SLP Follow Up: Maximize cognitive function and independence;Reduce caregiver burden   Equipment: N/A   Reasons for discharge: Treatment goals met;Discharged from hospital        Burwell Bethel 03/05/2023, 10:10 AM

## 2023-03-05 NOTE — Progress Notes (Signed)
Inpatient Rehabilitation Care Coordinator Discharge Note   Patient Details  Name: Madison West MRN: [id number] Date of Birth: [birth year]   Discharge location: Home  Length of Stay: 15 Days  Discharge activity level: MOD I  Home/community participation: Self  Patient response KG:MWNUUV Literacy - How often do you need to have someone help you when you read instructions, pamphlets, or other written material from your doctor or pharmacy?: Never  Patient response OZ:DGUYQI Isolation - How often do you feel lonely or isolated from those around you?: Rarely  Services provided included: MD, RD, PT, OT, SLP, CM, RN, TR, Pharmacy, Neuropsych, SW  Financial Services:  Field seismologist Utilized: Medicare    Choices offered to/list presented to: Patient  Follow-up services arranged:  Home Health, DME Home Health Agency: [facility]    DME : Rollator    Patient response to transportation need: Is the patient able to respond to transportation needs?: Yes In the past 12 months, has lack of transportation kept you from medical appointments or from getting medications?: No In the past 12 months, has lack of transportation kept you from meetings, work, or from getting things needed for daily living?: No   Patient/Family verbalized understanding of follow-up arrangements:  Yes  Individual responsible for coordination of the follow-up plan: self  Confirmed correct DME delivered: Andria Rhein 03/05/2023    Comments (or additional information):  Summary of Stay    Date/Time Discharge Planning CSW  03/02/23 1434 Patient plans to d/c home. Patient currently in appeal status due to her expressing she feels as she is not ready for d/c on 9/9. CJB  02/23/23 1332 Patient wll require to d/c home at a MOD I level due to lack of assist. Pt to only have PRN assist at d/c. CJB       Andria Rhein

## 2023-03-08 NOTE — Progress Notes (Signed)
Patient ID: Madison West, female   DOB: 1955/12/29, 67 y.o.   MRN: [id number]    Sw received notification from OP office that patient had questions and concerns in reference to DME and 02.   Patient discussed compression socks, cancelling HH and rescheduling, rescheduling appointments etc. SW informed patient that there is currently no recommendation for 02. Patient stated that she declined the need for 02 before d/c.  Patient expressed she lost her cell phone and this is delaying her process to obtain a TTB. Patient plans to purchase privately through [employer].    Patient expressed she is anxious about being home alone. Daughters have been home with her over the weekend and starting today she is alone. Patient has HH eval today. SW informed patient once PT visits today this will allow patient to feel more comfortable in the home.

## 2023-03-09 ENCOUNTER — Encounter: Payer: Self-pay | Admitting: Physical Medicine and Rehabilitation

## 2023-03-09 ENCOUNTER — Telehealth: Payer: Self-pay | Admitting: *Deleted

## 2023-03-09 NOTE — Telephone Encounter (Signed)
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 She was hoping to go back by the end of month. Her follow up appt isn't until 04/06/23. Needs shower seat ordered.

## 2023-03-09 NOTE — Telephone Encounter (Signed)
Patient notified now she is asking if date can be changed to 03/18/23.

## 2023-03-09 NOTE — Telephone Encounter (Signed)
Patient says a shower seat was supposed to be ordered upon d/c. She has not received it. She did get rollator.

## 2023-03-10 ENCOUNTER — Encounter: Payer: Self-pay | Admitting: Physical Medicine and Rehabilitation

## 2023-03-10 NOTE — Telephone Encounter (Signed)
Patient aware She wanted copy placed up front for pick-up Printed and will get KR to sign.

## 2023-03-10 NOTE — Telephone Encounter (Signed)
Requested date 03/18/23 not 03/08/23

## 2023-03-11 ENCOUNTER — Institutional Professional Consult (permissible substitution): Payer: Medicare Other | Admitting: Pulmonary Disease

## 2023-03-12 ENCOUNTER — Other Ambulatory Visit: Payer: Self-pay | Admitting: Physician Assistant

## 2023-03-12 ENCOUNTER — Telehealth: Payer: Self-pay | Admitting: Physical Medicine and Rehabilitation

## 2023-03-12 NOTE — Telephone Encounter (Signed)
Patient called in and said she hasn't received shower seat and was checking if it was ordered

## 2023-03-15 ENCOUNTER — Other Ambulatory Visit: Payer: Self-pay

## 2023-03-15 DIAGNOSIS — I25118 Atherosclerotic heart disease of native coronary artery with other forms of angina pectoris: Secondary | ICD-10-CM

## 2023-03-15 MED ORDER — FUROSEMIDE 20 MG PO TABS: 10.0000 mg | ORAL_TABLET | Freq: Every day | ORAL | 0 refills | Status: DC

## 2023-03-15 MED ORDER — SPIRONOLACTONE 25 MG PO TABS: 12.5000 mg | ORAL_TABLET | Freq: Every day | ORAL | 0 refills | Status: DC

## 2023-03-15 MED ORDER — METHOCARBAMOL 500 MG PO TABS: 500.0000 mg | ORAL_TABLET | Freq: Two times a day (BID) | ORAL | 0 refills | Status: DC

## 2023-03-15 MED ORDER — GABAPENTIN 300 MG PO CAPS: 300.0000 mg | ORAL_CAPSULE | Freq: Three times a day (TID) | ORAL | 0 refills | Status: DC

## 2023-03-15 MED ORDER — VITAMIN D (ERGOCALCIFEROL) 1.25 MG (50000 UNIT) PO CAPS: 50000.0000 [IU] | ORAL_CAPSULE | ORAL | 0 refills | Status: DC

## 2023-03-15 MED ORDER — APIXABAN 5 MG PO TABS: 5.0000 mg | ORAL_TABLET | Freq: Two times a day (BID) | ORAL | 0 refills | Status: DC

## 2023-03-15 MED ORDER — DOCUSATE SODIUM 100 MG PO CAPS: 100.0000 mg | ORAL_CAPSULE | Freq: Two times a day (BID) | ORAL | Status: DC

## 2023-03-15 MED ORDER — OXYCODONE HCL 5 MG PO TABS: 5.0000 mg | ORAL_TABLET | ORAL | 0 refills | Status: DC | PRN

## 2023-03-15 MED ORDER — POLYETHYLENE GLYCOL 3350 17 G PO PACK: 17.0000 g | PACK | Freq: Every day | ORAL | Status: DC

## 2023-03-15 MED ORDER — DAPAGLIFLOZIN PROPANEDIOL 10 MG PO TABS: 10.0000 mg | ORAL_TABLET | Freq: Every day | ORAL | 2 refills | Status: DC

## 2023-03-15 MED ORDER — DM-GUAIFENESIN ER 30-600 MG PO TB12: 1.0000 | ORAL_TABLET | Freq: Two times a day (BID) | ORAL | Status: DC

## 2023-03-15 MED ORDER — BUSPIRONE HCL 10 MG PO TABS: 10.0000 mg | ORAL_TABLET | Freq: Two times a day (BID) | ORAL | 0 refills | Status: DC

## 2023-03-15 MED ORDER — SACUBITRIL-VALSARTAN 49-51 MG PO TABS: 1.0000 | ORAL_TABLET | Freq: Two times a day (BID) | ORAL | 0 refills | Status: DC

## 2023-03-15 MED ORDER — NITROGLYCERIN 0.4 MG SL SUBL: 0.4000 mg | SUBLINGUAL_TABLET | SUBLINGUAL | 2 refills | Status: DC | PRN

## 2023-03-15 MED ORDER — ROSUVASTATIN CALCIUM 20 MG PO TABS: 20.0000 mg | ORAL_TABLET | Freq: Every day | ORAL | 0 refills | Status: DC

## 2023-03-15 MED ORDER — CLOPIDOGREL BISULFATE 75 MG PO TABS: 75.0000 mg | ORAL_TABLET | Freq: Every day | ORAL | 0 refills | Status: DC

## 2023-03-15 MED ORDER — LIDOCAINE 5 % EX PTCH: 2.0000 | MEDICATED_PATCH | Freq: Every day | CUTANEOUS | Status: DC | PRN

## 2023-03-15 MED ORDER — MELATONIN 3 MG PO TABS: 3.0000 mg | ORAL_TABLET | Freq: Every day | ORAL | Status: DC

## 2023-03-15 MED ORDER — AMIODARONE HCL 200 MG PO TABS: 200.0000 mg | ORAL_TABLET | Freq: Two times a day (BID) | ORAL | 0 refills | Status: DC

## 2023-03-15 NOTE — Telephone Encounter (Signed)
Please send requested scripts to [facility]. Patient has requested refills until she can get follow up appointments.

## 2023-03-16 NOTE — Progress Notes (Unsigned)
[facility]   MULTIDISCIPLINARY HEART VALVE CLINIC                                     Cardiology Office Note:    Date:  03/18/2023   ID:  Madison West, DOB 11-22-1955, MRN [id number]  PCP:  Madison Manson, NP  [facility] Cardiologist:  Madison Negus, MD  [facility] Electrophysiologist:  None   Referring MD: Madison Coca, MD   1 month s/p TAVR  History of Present Illness:    Madison West is a 67 y.o. female with a hx of CAD s/p PCI to the RCA 02/2020, SVT, breast cancer s/p chemo/XRT in 2009, HTN, HLD, carotid artery disease s/p R CEA 2020, obesity, former smoker, extreme anxiety with multiple medication intolerances/phobias, chronic HFrEF, VT arrest, cardiogenic shock and severe LFLG aortic stenosis s/p TAVR (02/04/23) who presents to clinic for follow up.   Recently admitted for a complex admission from 8/7-9/13/24. Presented with acute CHF. Echo showed new LV dysfunction with an EF 30-35% and severe LFLG AS with a mean grad 20 mmHg, peak grad 33.4 mmHg, AVA 0.93 cm2, DVI 0.33, SVI 25. She was diuresed with IV lasix. Madison West 01/27/23 showed 95% mid LAD stenosis with mild calcification and moderate tortuosity (New since 2021), 40% downstream lesion in mid LAD (New since 2021), mid diffuse 50% Lcx disease (previously deemed 80% in 2021), patent ramus stent and mid eccentric 40% RCA disease. She was seen by Madison West with CT surgery who felt she was too high risk for surgery and though PCI + TAVR was a better option. She underwent successful PCI/DES to LAD on 01/29/23 and started on aspirin and Plavix. She was started on metoprolol with plans to discharge home the following day. However, on 08/11 she developed pulseless VT requiring CPR and defibrillation and amio gtt. She had a STEMI with acute LAD stent thrombosis treated with aspiration thrombectomy and IVUS guided balloon dilatation. Plavix switched to Brillinta. Repeat echo showed EF down to 20-25%. She  then developed recurrent episodes of pulseless VT on 01/31/22 for which she required multiple shocks and mechanical ventilation. Taken back to the cath lab. LAD stent patent. D/t recurrent VT with hemodynamic instability + cardiogenic shock, IABP was placed and required multiple vasopressors. ECMO considered but then the pt stabilized and underwent TAVR with a 26 mm Edwards S3UR via the left TF approach on 02/04/23. Echo 02/05/23 with EF 35-40%, mild LVH, WMAs, RV normal, bioprosthetic aortic valve s/p TAVR with mean gradient 13 mmH, trivial PVL, IVC mildly dilated. She later developed a left internal jugular DVT and started on Eliquis. Plan for triple therapy for a month then drop aspirin. Treated with Abx due to PNA with sepsis. Stabilized and discharged to inpatient rehab with discharge home on 03/05/23.  Today the patient presents to clinic for follow up. She is here alone and doing remarkably well. She is able to drive and function with no deficits. No CP or SOB. No LE edema, orthopnea or PND. No dizziness or syncope. No blood in stool or urine. No palpitations. 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    Past Medical History:  Diagnosis Date   Anxiety    Arthritis    low back and hip pain intermittent   Breast cancer (HCC) 06/02/07   r breast -surgery ,radiaology. chemotherapy   Carpal  tunnel syndrome    right hand   Colon polyps    Complication of anesthesia    Fentanyl, Versed-makes extra hyper, bradycardia x 1 in PACU, [facility] (08/15/11 cardiology felt neostigmine may have resulted in AV nodal block)    Coronary artery disease    Depression    denies   Dysplasia of vulva    Hypertension    Palpitations    PSVT, s/p adenosine 08/04/16   S/P breast lumpectomy 07/04/07   R breast   S/P radiation therapy 2009    Past Surgical History:  Procedure Laterality Date   APPENDECTOMY     age 5   CESAREAN SECTION     x 2   COLONOSCOPY W/ POLYPECTOMY     COLONOSCOPY WITH PROPOFOL  N/A 04/27/2016   Procedure: COLONOSCOPY WITH PROPOFOL;  Surgeon: Charolett Bumpers, MD;  Location: [facility];  Service: Endoscopy;  Laterality: N/A;   CORONARY STENT INTERVENTION N/A 01/29/2023   Procedure: CORONARY STENT INTERVENTION;  Surgeon: Madison Negus, MD;  [facility]: [facility];  Service: Cardiovascular;  Laterality: N/A;   CORONARY THROMBECTOMY N/A 01/31/2023   Procedure: Coronary Thrombectomy;  Surgeon: Madison Negus, MD;  [facility]: [facility];  Service: Cardiovascular;  Laterality: N/A;   CORONARY ULTRASOUND/IVUS N/A 01/31/2023   Procedure: Coronary Ultrasound/IVUS;  Surgeon: Madison Negus, MD;  [facility];  Service: Cardiovascular;  Laterality: N/A;   CORONARY/GRAFT ACUTE MI REVASCULARIZATION N/A 01/31/2023   Procedure: Coronary/Graft Acute MI Revascularization;  Surgeon: Madison Negus, MD;  [facility];  Service: Cardiovascular;  Laterality: N/A;   DILATION AND CURETTAGE OF UTERUS     multiple   ENDARTERECTOMY Right 06/09/2019   ENDARTERECTOMY Right 06/09/2019   Procedure: ENDARTERECTOMY CAROTID RIGHT;  Surgeon: Sherren Kerns, MD;  Location: [facility] OR;  Service: Vascular;  Laterality: Right;   IABP INSERTION N/A 02/02/2023   Procedure: IABP Insertion;  Surgeon: Madison Negus, MD;  [facility];  Service: Cardiovascular;  Laterality: N/A;   INTRAUTERINE DEVICE INSERTION     IUD REMOVAL     LEFT HEART CATH AND CORONARY ANGIOGRAPHY N/A 02/27/2020   Procedure: LEFT HEART CATH AND CORONARY ANGIOGRAPHY;  Surgeon: Yates Decamp, MD;  [facility];  Service: Cardiovascular;  Laterality: N/A;   LEFT HEART CATH AND CORONARY ANGIOGRAPHY N/A 01/31/2023   Procedure: LEFT HEART CATH AND CORONARY ANGIOGRAPHY;  Surgeon: Madison Negus, MD;  [facility];  Service: Cardiovascular;  Laterality: N/A;   MASTECTOMY PARTIAL / LUMPECTOMY W/ AXILLARY LYMPHADENECTOMY Right     lumpectomy and lymph nodes removed   PATCH ANGIOPLASTY Right 06/09/2019   Procedure: Patch Angioplasty of right carotid artery using hemashield paltinum finesse patch;  Surgeon: Sherren Kerns, MD;  Location: [facility] OR;  Service: Vascular;  Laterality: Right;   RIGHT AND LEFT HEART CATH N/A 02/01/2023   Procedure: RIGHT AND LEFT HEART CATH;  Surgeon: Madison Negus, MD;  [facility];  Service: Cardiovascular;  Laterality: N/A;   RIGHT HEART CATH N/A 02/02/2023   Procedure: RIGHT HEART CATH;  Surgeon: Dolores Patty, MD;  [facility];  Service: Cardiovascular;  Laterality: N/A;   RIGHT/LEFT HEART CATH AND CORONARY ANGIOGRAPHY N/A 01/27/2023   Procedure: RIGHT/LEFT HEART CATH AND CORONARY ANGIOGRAPHY;  Surgeon: Madison Negus, MD;  [facility];  Service: Cardiovascular;  Laterality: N/A;   TEE WITHOUT  CARDIOVERSION N/A 02/04/2023   Procedure: TRANSESOPHAGEAL ECHOCARDIOGRAM;  Surgeon: Orbie Pyo, MD;  Location: [facility] INVASIVE CV LAB;  Service: Open Heart Surgery;  Laterality: N/A;   TEMPORARY PACEMAKER N/A 02/02/2023   Procedure: TEMPORARY PACEMAKER;  Surgeon: Dolores Patty, MD;  [facility];  Service: Cardiovascular;  Laterality: N/A;   TRANSCATHETER AORTIC VALVE REPLACEMENT, TRANSFEMORAL N/A 02/04/2023   Procedure: Transcatheter Aortic Valve Replacement, Transfemoral;  Surgeon: Orbie Pyo, MD;  [facility];  Service: Open Heart Surgery;  Laterality: N/A;   TUBAL LIGATION     VULVA SURGERY     Multiple times for dysplasia    Current Medications: Current Meds  Medication Sig   acetaminophen (TYLENOL) 325 MG tablet Take 2 tablets (650 mg total) by mouth every 6 (six) hours. (Patient taking differently: Take 500 mg by mouth every 6 (six) hours.)   alprazolam (XANAX) 2 MG tablet Take 0.25-2 mg by mouth See admin instructions. 1 mg (may take up to 2 mg) twice daily. May also take 0.25 mg  during the day if needed for anxiety.   amoxicillin (AMOXIL) 500 MG tablet Take 4 tablets (2,000 mg total) by mouth as directed. 1 hour prior to dental work including cleanings   busPIRone (BUSPAR) 10 MG tablet Take 1 tablet (10 mg total) by mouth 2 (two) times daily.   dextromethorphan-guaiFENesin (MUCINEX DM) 30-600 MG 12hr tablet Take 1 tablet by mouth 2 (two) times daily.   gabapentin (NEURONTIN) 300 MG capsule Take 1 capsule (300 mg total) by mouth 3 (three) times daily.   methocarbamol (ROBAXIN) 500 MG tablet Take 1 tablet (500 mg total) by mouth 2 (two) times daily.   nitroGLYCERIN (NITROSTAT) 0.4 MG SL tablet Place 1 tablet (0.4 mg total) under the tongue every 5 (five) minutes as needed for chest pain.   oxyCODONE (OXY IR/ROXICODONE) 5 MG immediate release tablet Take 1 tablet (5 mg total) by mouth every 4 (four) hours as needed for moderate pain or severe pain.   Vitamin D, Ergocalciferol, (DRISDOL) 1.25 MG (50000 UNIT) CAPS capsule Take 1 capsule (50,000 Units total) by mouth every 7 (seven) days. Take each Friday until finished.   [DISCONTINUED] amiodarone (PACERONE) 200 MG tablet Take 1 tablet (200 mg total) by mouth 2 (two) times daily.   [DISCONTINUED] amiodarone (PACERONE) 200 MG tablet Take 1 tablet (200 mg total) by mouth daily.   [DISCONTINUED] apixaban (ELIQUIS) 5 MG TABS tablet Take 1 tablet (5 mg total) by mouth 2 (two) times daily.   [DISCONTINUED] clopidogrel (PLAVIX) 75 MG tablet Take 1 tablet (75 mg total) by mouth daily.   [DISCONTINUED] dapagliflozin propanediol (FARXIGA) 10 MG TABS tablet Take 1 tablet (10 mg total) by mouth daily.   [DISCONTINUED] furosemide (LASIX) 20 MG tablet Take 0.5 tablets (10 mg total) by mouth daily.   [DISCONTINUED] metoprolol succinate (TOPROL XL) 25 MG 24 hr tablet Take 0.5 tablets (12.5 mg total) by mouth daily.   [DISCONTINUED] metoprolol succinate (TOPROL XL) 25 MG 24 hr tablet Take 0.5 tablets (12.5 mg total) by mouth daily.    [DISCONTINUED] rosuvastatin (CRESTOR) 20 MG tablet Take 1 tablet (20 mg total) by mouth daily.   [DISCONTINUED] sacubitril-valsartan (ENTRESTO) 49-51 MG Take 1 tablet by mouth 2 (two) times daily.   [DISCONTINUED] spironolactone (ALDACTONE) 25 MG tablet Take 0.5 tablets (12.5 mg total) by mouth daily.      ROS:   Please see the history of present illness.    All other  systems reviewed and are negative.  EKGs   EKG:  EKG is ordered today.  The ekg ordered today demonstrates sinus with 1st deg AV block and inferior Q waves. HR 76  Recent Labs: 02/14/2023: B Natriuretic Peptide 418.7 02/19/2023: ALT 27; Magnesium 2.0 03/01/2023: BUN 14; Creatinine, Ser 0.94; Hemoglobin 9.6; Platelets 338; Potassium 4.2; Sodium 135  Recent Lipid Panel    Component Value Date/Time   CHOL 220 (H) 12/30/2022 1604   TRIG 91 02/04/2023 0207   HDL 51 12/30/2022 1604   CHOLHDL 4.2 01/19/2020 1434   CHOLHDL 6.7 12/17/2011 0949   VLDL 48 (H) 12/17/2011 0949   LDLCALC 146 (H) 12/30/2022 1604     Risk Assessment/Calculations:            Physical Exam:    VS:  BP 104/66   Pulse 73   Ht 5\' 8"  (1.727 m)   Wt 201 lb 3.2 oz (91.3 kg)   SpO2 90%   BMI 30.59 kg/m     Wt Readings from Last 3 Encounters:  03/17/23 201 lb 3.2 oz (91.3 kg)  03/05/23 220 lb 14.4 oz (100.2 kg)  02/18/23 223 lb 1.7 oz (101.2 kg)     GEN: Well nourished, well developed in no acute distress NECK: No JVD; No carotid bruits CARDIAC: RRR, no murmurs, rubs, gallops RESPIRATORY:  Clear to auscultation without rales, wheezing or rhonchi  ABDOMEN: Soft, non-tender, non-distended EXTREMITIES:  No edema; No deformity   ASSESSMENT:    1. S/P TAVR (transcatheter aortic valve replacement)   2. HFrEF (heart failure with reduced ejection fraction) (HCC)   3. VT (ventricular tachycardia) (HCC)   4. Coronary artery disease of native artery of native heart with stable angina pectoris (HCC)   5. Carotid artery disease, unspecified  laterality (HCC)   6. Deep vein thrombosis (DVT) of proximal lower extremity, unspecified chronicity, unspecified laterality (HCC)   7. Anemia, unspecified type    PLAN:    In order of problems listed above:  Severe AS s/p TAVR: echo today shows EF 40-45%, normally functioning TAVR with a mean gradient of 6 mm hg and no PVL as well as moderate MAC and mild MR. She has NYHA class I symptoms and doing remarkably well. Continue on Plavix and Eliquis for now. SBE prophylaxis discussed; I have RX'd amoxicillin. I will see her back in 1 year with an echo.  HFrEF: EF down to 20% in setting of severe ischemic heart disease, severe AS and refractory VT/VF. Echo today showed improvement in EF to 40-45%.  - Continue Entresto 49/51 mg BID, spiro 12.5 mg daily, farxiga 10mg  daily. BB was not added given low output heart failure. I will add Toprol XL 12.5mg  daily now.    Refractory VT: will reduce from amio 200 mg twice a day to 200 mg daily. If she remains on this long term will need thyroid/liver/lung/eye surveillance studies.   CAD: -HX PCI to RCA in 2021 -Presented with unstable angina. S/p PCI/stent to LAD on 01/27/23. Stent thrombosis 08/09 treated with thrombectomy -HS troponin > 24K on 08/12 -LHC 08/12 with patent LAD stent -Continue on Plavix 75mg  daily + Eliquis 5mg  BID for left internal jugular DVT. Aspirin discontinued after 1 month of triple therapy.    Carotid artery stenosis: s/p R CEA in 2020  DVT left internal jugular: continue eliquis 5 mg BID. Since this was a provoked DVT, she may not require long term anticoagulation. Will defer to primary cardiologist.    Anemia: CBC  9/9 showed Hg 9.6   Total time spent with patient was over 40 minutes which included evaluating patient, reviewing record and coordinating care. Face to face time >50%.       Cardiac Rehabilitation Eligibility Assessment  The patient is ready to start cardiac rehabilitation from a cardiac standpoint.        Medication Adjustments/Labs and Tests Ordered: Current medicines are reviewed at length with the patient today.  Concerns regarding medicines are outlined above.  Orders Placed This Encounter  Procedures   EKG 12-Lead   Meds ordered this encounter  Medications   DISCONTD: amiodarone (PACERONE) 200 MG tablet    Sig: Take 1 tablet (200 mg total) by mouth daily.    Dispense:  90 tablet    Refill:  2    Dose decrease   DISCONTD: metoprolol succinate (TOPROL XL) 25 MG 24 hr tablet    Sig: Take 0.5 tablets (12.5 mg total) by mouth daily.    Dispense:  45 tablet    Refill:  3   sacubitril-valsartan (ENTRESTO) 49-51 MG    Sig: Take 1 tablet by mouth 2 (two) times daily.    Dispense:  60 tablet    Refill:  6   spironolactone (ALDACTONE) 25 MG tablet    Sig: Take 0.5 tablets (12.5 mg total) by mouth daily.    Dispense:  45 tablet    Refill:  3   rosuvastatin (CRESTOR) 20 MG tablet    Sig: Take 1 tablet (20 mg total) by mouth daily.    Dispense:  90 tablet    Refill:  3   DISCONTD: metoprolol succinate (TOPROL XL) 25 MG 24 hr tablet    Sig: Take 0.5 tablets (12.5 mg total) by mouth daily.    Dispense:  45 tablet    Refill:  3   furosemide (LASIX) 20 MG tablet    Sig: Take 0.5 tablets (10 mg total) by mouth daily.    Dispense:  45 tablet    Refill:  3   dapagliflozin propanediol (FARXIGA) 10 MG TABS tablet    Sig: Take 1 tablet (10 mg total) by mouth daily.    Dispense:  30 tablet    Refill:  6   clopidogrel (PLAVIX) 75 MG tablet    Sig: Take 1 tablet (75 mg total) by mouth daily.    Dispense:  90 tablet    Refill:  3   apixaban (ELIQUIS) 5 MG TABS tablet    Sig: Take 1 tablet (5 mg total) by mouth 2 (two) times daily.    Dispense:  60 tablet    Refill:  6   amiodarone (PACERONE) 200 MG tablet    Sig: Take 1 tablet (200 mg total) by mouth daily.    Dispense:  90 tablet    Refill:  3    Dose decrease   amoxicillin (AMOXIL) 500 MG tablet    Sig: Take 4 tablets (2,000 mg  total) by mouth as directed. 1 hour prior to dental work including cleanings    Dispense:  12 tablet    Refill:  12    Order Specific Question:   Supervising Provider    Answer:   Excell Seltzer, MICHAEL [[id number]]   metoprolol succinate (TOPROL XL) 25 MG 24 hr tablet    Sig: Take 0.5 tablets (12.5 mg total) by mouth daily.    Dispense:  45 tablet    Refill:  3    Order Specific Question:   Supervising Provider  Answer:   COOPER, MICHAEL [[id number]]    Patient Instructions  Medication Instructions:   DECREASE YOUR AMIODARONE TO 200 MG BY MOUTH DAILY  START TAKING METOPROLOL SUCCINATE (TOPROL XL) 12.5 MG BY MOUTH DAILY  *If you need a refill on your cardiac medications before your next appointment, please call your pharmacy*     Follow-Up:  WITH DR. PATWARDHAN ON 05/10/23 AT 3:20 PM   YOU WILL SEE KATIE Madison Vicario PA-C -STRUCTURAL APP IN ONE YEAR WITH ECHO SAME DAY ON 02/02/2024 AT 2:45 PM--YOUR ECHO WILL BE AT 1:45 PM ON THAT DAY    Signed, Cline Crock, PA-C  03/18/2023 10:06 AM    [facility]

## 2023-03-17 ENCOUNTER — Ambulatory Visit: Payer: Medicare Other | Attending: Cardiology

## 2023-03-17 ENCOUNTER — Other Ambulatory Visit (HOSPITAL_COMMUNITY): Payer: Self-pay

## 2023-03-17 ENCOUNTER — Ambulatory Visit: Payer: Medicare Other | Admitting: Cardiology

## 2023-03-17 ENCOUNTER — Ambulatory Visit (INDEPENDENT_AMBULATORY_CARE_PROVIDER_SITE_OTHER): Payer: Medicare Other | Admitting: Physician Assistant

## 2023-03-17 ENCOUNTER — Encounter: Payer: Self-pay | Admitting: *Deleted

## 2023-03-17 VITALS — BP 104/66 | HR 73 | Ht 68.0 in | Wt 201.2 lb

## 2023-03-17 DIAGNOSIS — I472 Ventricular tachycardia, unspecified: Secondary | ICD-10-CM | POA: Insufficient documentation

## 2023-03-17 DIAGNOSIS — I25118 Atherosclerotic heart disease of native coronary artery with other forms of angina pectoris: Secondary | ICD-10-CM

## 2023-03-17 DIAGNOSIS — I824Y9 Acute embolism and thrombosis of unspecified deep veins of unspecified proximal lower extremity: Secondary | ICD-10-CM

## 2023-03-17 DIAGNOSIS — Z952 Presence of prosthetic heart valve: Secondary | ICD-10-CM | POA: Insufficient documentation

## 2023-03-17 DIAGNOSIS — D649 Anemia, unspecified: Secondary | ICD-10-CM | POA: Insufficient documentation

## 2023-03-17 DIAGNOSIS — I779 Disorder of arteries and arterioles, unspecified: Secondary | ICD-10-CM | POA: Insufficient documentation

## 2023-03-17 DIAGNOSIS — I5020 Unspecified systolic (congestive) heart failure: Secondary | ICD-10-CM | POA: Insufficient documentation

## 2023-03-17 LAB — ECHOCARDIOGRAM COMPLETE
AV Mean grad: 6 mmHg
AV Peak grad: 10.9 mmHg
Ao pk vel: 1.65 m/s
Area-P 1/2: 3.12 cm2
S' Lateral: 4 cm

## 2023-03-17 MED ORDER — AMIODARONE HCL 200 MG PO TABS
200.0000 mg | ORAL_TABLET | Freq: Every day | ORAL | 2 refills | Status: DC
  Filled 2023-03-17: qty 90, 90d supply, fill #0

## 2023-03-17 MED ORDER — METOPROLOL SUCCINATE ER 25 MG PO TB24
12.5000 mg | ORAL_TABLET | Freq: Every day | ORAL | 3 refills | Status: DC
  Filled 2023-03-17: qty 45, 90d supply, fill #0

## 2023-03-17 MED ORDER — DAPAGLIFLOZIN PROPANEDIOL 10 MG PO TABS: 10.0000 mg | ORAL_TABLET | Freq: Every day | ORAL | 6 refills | Status: DC

## 2023-03-17 MED ORDER — SACUBITRIL-VALSARTAN 49-51 MG PO TABS: 1.0000 | ORAL_TABLET | Freq: Two times a day (BID) | ORAL | 6 refills | Status: DC

## 2023-03-17 MED ORDER — AMIODARONE HCL 200 MG PO TABS: 200.0000 mg | ORAL_TABLET | Freq: Every day | ORAL | 3 refills | Status: DC

## 2023-03-17 MED ORDER — METOPROLOL SUCCINATE ER 25 MG PO TB24: 12.5000 mg | ORAL_TABLET | Freq: Every day | ORAL | 3 refills | Status: DC

## 2023-03-17 MED ORDER — ROSUVASTATIN CALCIUM 20 MG PO TABS: 20.0000 mg | ORAL_TABLET | Freq: Every day | ORAL | 3 refills | Status: DC

## 2023-03-17 MED ORDER — CLOPIDOGREL BISULFATE 75 MG PO TABS: 75.0000 mg | ORAL_TABLET | Freq: Every day | ORAL | 3 refills | Status: DC

## 2023-03-17 MED ORDER — APIXABAN 5 MG PO TABS: 5.0000 mg | ORAL_TABLET | Freq: Two times a day (BID) | ORAL | 6 refills | Status: DC

## 2023-03-17 MED ORDER — FUROSEMIDE 20 MG PO TABS: 10.0000 mg | ORAL_TABLET | Freq: Every day | ORAL | 3 refills | Status: DC

## 2023-03-17 MED ORDER — SPIRONOLACTONE 25 MG PO TABS: 12.5000 mg | ORAL_TABLET | Freq: Every day | ORAL | 3 refills | Status: DC

## 2023-03-17 NOTE — Patient Instructions (Addendum)
Medication Instructions:   DECREASE YOUR AMIODARONE TO 200 MG BY MOUTH DAILY  START TAKING METOPROLOL SUCCINATE (TOPROL XL) 12.5 MG BY MOUTH DAILY  *If you need a refill on your cardiac medications before your next appointment, please call your pharmacy*     Follow-Up:  WITH DR. PATWARDHAN ON 05/10/23 AT 3:20 PM   YOU WILL SEE KATIE THOMPSON PA-C -STRUCTURAL APP IN ONE YEAR WITH ECHO SAME DAY ON 02/02/2024 AT 2:45 PM--YOUR ECHO WILL BE AT 1:45 PM ON THAT DAY

## 2023-03-18 MED ORDER — METOPROLOL SUCCINATE ER 25 MG PO TB24: 12.5000 mg | ORAL_TABLET | Freq: Every day | ORAL | 3 refills | Status: DC

## 2023-03-18 MED ORDER — AMOXICILLIN 500 MG PO TABS: 2000.0000 mg | ORAL_TABLET | ORAL | 12 refills | Status: DC

## 2023-03-22 ENCOUNTER — Telehealth: Payer: Self-pay | Admitting: Cardiology

## 2023-03-22 NOTE — Telephone Encounter (Signed)
Returned Pts call. Pt states she started taking metoprolol succinate for 3 days and her BP dropped. Pt states that her BP was taken by her PT and was 90/57. She said that one of the days she was on the medication she bent over and became dizzy. BP this morning before any medication was 105/60. Advised pt to go ahead and hold metoprolol succinate today and I would reach out to Cline Crock, [place] and Dr Rosemary Holms (whom she states she wants to see going forward) for further instruction.

## 2023-03-22 NOTE — Telephone Encounter (Signed)
Spoke with Pt. Told pt what Cline Crock PA advised and pt stated understanding.  Medication taken off list.

## 2023-03-22 NOTE — Telephone Encounter (Signed)
Agree. No further action needed.  Thanks MJP

## 2023-03-22 NOTE — Telephone Encounter (Signed)
Pt c/o medication issue:  1. Name of Medication:  metoprolol succinate (TOPROL XL) 25 MG 24 hr tablet  2. How are you currently taking this medication (dosage and times per day)?  As prescribed  3. Are you having a reaction (difficulty breathing--STAT)?   4. What is your medication issue?   Patient states she took the medication as prescribed for 3 days and her BP dropped to 90/57, so she stopped taking it.

## 2023-03-23 ENCOUNTER — Telehealth: Payer: Self-pay | Admitting: Cardiology

## 2023-03-23 NOTE — Telephone Encounter (Signed)
[name], PT called in stating pt bp is still going up and down. She states she believes it it related to her doing physical activity. She states she has been encouraging her to take frequent rest breaks in between    107/72  84/54 115/73

## 2023-03-23 NOTE — Telephone Encounter (Signed)
Spoke with PT, [name], who states that he patient's blood pressure was low when she got there today. She states that the patient is very active and seems to be overdoing it. She states that she is always up doing chores around the house when she gets there. She states that the patient reported normal blood pressure this morning. [name] did not do much activity with the patient today. She states that the patient called her a little bit after she left and her blood pressure was back to normal. She did confirm that the patient stopped her metoprolol yesterday.

## 2023-03-25 ENCOUNTER — Telehealth: Payer: Self-pay | Admitting: Cardiology

## 2023-03-25 MED ORDER — AMIODARONE HCL 200 MG PO TABS: 200.0000 mg | ORAL_TABLET | Freq: Every day | ORAL | 0 refills | Status: DC

## 2023-03-25 NOTE — Telephone Encounter (Signed)
Rx(s) sent to pharmacy electronically.   Spoke with the patient who states she is waiting for [facility] to mail her medication but she hasn't received it and she does not want to miss any doses. [facility] [place] per the patients request. She thanked me for calling and voiced understanding.

## 2023-03-25 NOTE — Telephone Encounter (Signed)
*  STAT* If patient is at the pharmacy, call can be transferred to refill team.   1. Which medications need to be refilled? (please list name of each medication and dose if known) amiodarone (PACERONE) 200 MG tablet    2. Would you like to learn more about the convenience, safety, & potential cost savings by using the [facility]? No     3. Are you open to using the [facility] (Type [facility]. No ).   4. Which pharmacy/location (including street and city if local pharmacy) is medication to be sent to?   5. Do they need a 30 day or 90 day supply? 30

## 2023-04-02 ENCOUNTER — Telehealth: Payer: Self-pay | Admitting: Physician Assistant

## 2023-04-02 ENCOUNTER — Other Ambulatory Visit: Payer: Self-pay | Admitting: *Deleted

## 2023-04-02 NOTE — Telephone Encounter (Signed)
*  STAT* If patient is at the pharmacy, call can be transferred to refill team.   1. Which medications need to be refilled? (please list name of each medication and dose if known)   spironolactone (ALDACTONE) 25 MG tablet    2. Which pharmacy/location (including street and city if local pharmacy) is medication to be sent to?  [id number] [place] [place] - [address]      3. Do they need a 30 day or 90 day supply? 30 day    Pt is out of medication due to her misplacing it.

## 2023-04-06 ENCOUNTER — Encounter
Payer: Medicare Other | Attending: Physical Medicine and Rehabilitation | Admitting: Physical Medicine and Rehabilitation

## 2023-04-09 ENCOUNTER — Telehealth: Payer: Self-pay | Admitting: Cardiology

## 2023-04-09 NOTE — Telephone Encounter (Signed)
Pt reports cough for the past week/two. Coughing up red/brown thick stuff. Asking if she should start taking Mucinex again. Advised to call PCP to further discuss this concern as it is not heart related.  Reports dizziness twice this past week but checked BP & HR and says there were both normal.  Pt will keep an eye on this, understands it could be her body  adjusting to several new medications and still recovering from recent hospitalization.  She will call office back if need to readdress dizzy spells.

## 2023-04-09 NOTE — Telephone Encounter (Signed)
New Message:      Patient says she would like to have the nurse to please call her.She said for over a week, she have been coughing up dark brownish reddish mucus. She said she had a little yesterday and so far today she have not had anything. She said it really scared her,       .   STAT if patient feels like he/she is going to faint    1. Are you feeling dizzy, lightheaded, or faint right now? Started having dizzy spells Wednesday and last night - when she sit down, it went away   2. Have you passed out?  no (If yes move to [facility])   3. Do you have any other symptoms? No    4. Have you checked your HR and BP (record if available)?  They were both normal

## 2023-04-13 ENCOUNTER — Telehealth: Payer: Self-pay | Admitting: Cardiology

## 2023-04-13 NOTE — Telephone Encounter (Signed)
Spoke with patient and I rescheduled her for your quarter day on Thursday.    She stated she has not taken any medication today because she is not sure what not to take.  Did advise to continue to monitor her BP. Was going over medication list with patient and the phone got disconnected x2  Called patient back and phone goes straight to voicemail x3

## 2023-04-13 NOTE — Telephone Encounter (Signed)
Patient called the answering service this evening for clarification about what medications she can take with her low BP. She had called the office earlier today who encouraged her to eat, stay hydrated, make position changes slowly, and hold certain medications. Her phone call cut off, and she did not hear what medications to hold.  She reports feeling well this evening and denies dizziness, shortness of breath, lightheadedness. BP was 91/62 when last checked   I reviewed her medication list, and told her not to take lasix, spironolactone, or entresto. Encouraged her to continue to monitor BP, and to go to the ED if she becomes dizzy or syncopal. Also reminded her of her appointment on 10/24.

## 2023-04-13 NOTE — Telephone Encounter (Signed)
Agree with the recommendations. If needs to be seen sooner than 11/18, okay to add to my DOD day or quarter day.  Thanks MJP

## 2023-04-13 NOTE — Telephone Encounter (Signed)
Pt c/o BP issue: STAT if pt c/o blurred vision, one-sided weakness or slurred speech  1. What are your last 5 BP readings?  7am 85/53 9am 99/57 10am 109/69 10:15am 103/63 11am 96/58 Last night at midnight 97/58 30 mins later 95/55  2. Are you having any other symptoms (ex. Dizziness, headache, blurred vision, passed out)? Patient states she is just a little bit dizzy, but she states is nervous.  3. What is your BP issue? Low BP, she states she hasn't taken her morning medication because of her low blood pressure.

## 2023-04-13 NOTE — Telephone Encounter (Signed)
Spoke with the patient who states that since last night her blood pressure has been running low (see readings below). She reports that she has not taken any of her morning medications yet because she was scared that they would drop her blood pressure even lower. She does report some slight dizziness. She has just been resting this morning. She had a few sunschips earlier this morning to see if something salty would bring up her pressure. She states that she also had two cups of coffee. She has not had anything else since. I advised her to drink some water and have something more substantial to eat. Advised to make sure she is getting up and changing positions slowly and she will continue to monitor. Will make Dr. Rosemary Holms aware.

## 2023-04-14 NOTE — Telephone Encounter (Signed)
Spoke with patient again just to confirm the meds she should hold today prior to tomorrow's appt with Patwardhan. No further questions.

## 2023-04-14 NOTE — Telephone Encounter (Signed)
Returned call to patient who states last night she got home and was upset over numerous things such as dirty dishes and dog being left outside, so BP was high then. Says this morning her BP was back down to 83/14mmhg at 6am, fresh out of bed. Still no symptoms other than mild dizziness. Repeat an hour later 108/83. Spoke at length with patient. She is going to hold the Entresto, Spironolactone, Furosemide, and [place] today. She will be seen here tomorrow and med adjustments likely to be made. She knows to bring her BP kit and medications with her to her visit.

## 2023-04-14 NOTE — Telephone Encounter (Signed)
Patient returned RN Sarah's call.

## 2023-04-15 ENCOUNTER — Encounter: Payer: Self-pay | Admitting: Cardiology

## 2023-04-15 ENCOUNTER — Ambulatory Visit: Payer: Medicare Other | Attending: Cardiology | Admitting: Cardiology

## 2023-04-15 VITALS — BP 124/72 | HR 79 | Resp 16 | Ht 68.0 in | Wt 199.8 lb

## 2023-04-15 DIAGNOSIS — E782 Mixed hyperlipidemia: Secondary | ICD-10-CM | POA: Diagnosis present

## 2023-04-15 DIAGNOSIS — I5020 Unspecified systolic (congestive) heart failure: Secondary | ICD-10-CM | POA: Insufficient documentation

## 2023-04-15 DIAGNOSIS — I2510 Atherosclerotic heart disease of native coronary artery without angina pectoris: Secondary | ICD-10-CM | POA: Diagnosis present

## 2023-04-15 DIAGNOSIS — Z8674 Personal history of sudden cardiac arrest: Secondary | ICD-10-CM | POA: Diagnosis present

## 2023-04-15 DIAGNOSIS — I959 Hypotension, unspecified: Secondary | ICD-10-CM | POA: Diagnosis present

## 2023-04-15 DIAGNOSIS — Z952 Presence of prosthetic heart valve: Secondary | ICD-10-CM | POA: Insufficient documentation

## 2023-04-15 MED ORDER — FUROSEMIDE 20 MG PO TABS
10.0000 mg | ORAL_TABLET | Freq: Every day | ORAL | 1 refills | Status: DC | PRN
Start: 2023-04-15 — End: 2023-12-13

## 2023-04-15 MED ORDER — ENTRESTO 24-26 MG PO TABS
1.0000 | ORAL_TABLET | Freq: Two times a day (BID) | ORAL | 3 refills | Status: DC
Start: 2023-04-15 — End: 2023-04-20

## 2023-04-15 NOTE — Progress Notes (Signed)
Cardiology Office Note:  .   Date:  04/15/2023  ID:  Madison West, DOB March 09, 1956, MRN [id number] PCP: April Manson, NP  [facility] Providers Cardiologist:  Truett Mainland, MD PCP: April Manson, NP  Chief Complaint  Patient presents with   BP management      History of Present Illness: .    Madison West is a 67 y.o. female with hyperlipidemia, CAD, prolonged hospital stay in 01/2023 with VF arrest due to acute stent thrombosis, s/p TAVR for severe AS with now recovering EF, h/o right carotid endarterectomy, h/o breast cancer, h/o provoked DVT in Lt internal jugular along with acute superficial vein thrombosis involving the left cephalic vein, proximal upper arm to mid forearm.   After prolonged hospital stay with multiple complications, patient is now doing very well.  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  Few days ago, she noticed her blood pressure in 80s/40s with mild lightheadedness.  She had long stopped taking metoprolol due to similar symptoms.  Since 04/12/2023, she is also stopped taking Farxiga, Lasix, Entresto, spironolactone.  She has had long history of intolerance to most of the above medications.  That said, patient is only drinking 200 cc of water daily.  Reviewed recent echocardiogram and lab results with the patient, details below.      Vitals:   04/15/23 1038  BP: 124/72  Pulse: 79  Resp: 16  SpO2: 92%     ROS:  Review of Systems  Cardiovascular:  Negative for chest pain, dyspnea on exertion, leg swelling, palpitations and syncope.  Neurological:  Positive for light-headedness.     Studies Reviewed: .       Echocardiogram 03/17/2023: 1. Left ventricular ejection fraction, by estimation, is 40 to 45%. The  left ventricle has mildly decreased function. The left ventricle  demonstrates global hypokinesis. Left ventricular diastolic function could  not  be evaluated. The average left ventricular global longitudinal strain is -12.2 %.  The global longitudinal strain is abnormal.  2. Right ventricular systolic function is normal. The right ventricular  size is normal.  3. The mitral valve is degenerative. Mild mitral valve regurgitation. No  evidence of mitral stenosis. Moderate mitral annular calcification.  4. The aortic valve has been repaired/replaced. Aortic valve  regurgitation is not visualized. No aortic stenosis is present. There is a  26 mm Sapien prosthetic (TAVR) valve present in the aortic position.  5. The inferior vena cava is normal in size with greater than 50%  respiratory variability, suggesting right atrial pressure of 3 mmHg.   Labs 02/2023 Hb 9.6  8/202: Lipoprotein (a) 227  12/2022: LDL 146   Physical Exam:   Physical Exam Vitals and nursing note reviewed.  Constitutional:      General: She is not in acute distress. Neck:     Vascular: No JVD.  Cardiovascular:     Rate and Rhythm: Normal rate and regular rhythm.     Heart sounds: Normal heart sounds. No murmur heard. Pulmonary:     Effort: Pulmonary effort is normal.     Breath sounds: Normal breath sounds. No wheezing or rales.  Musculoskeletal:     Right lower leg: No edema.     Left lower leg: Edema (Trace) present.      VISIT DIAGNOSES:   ICD-10-CM   1. Mixed hyperlipidemia  E78.2 furosemide (LASIX) 20 MG tablet    sacubitril-valsartan (ENTRESTO)  24-26 MG    Comp Met (CMET)    Pro b natriuretic peptide    Lipid Profile    AMB Referral to [facility] Pharm[facility]    Lipid Profile    Pro b natriuretic peptide    Comp Met (CMET)    2. Hypotension, unspecified hypotension type  I95.9 furosemide (LASIX) 20 MG tablet    sacubitril-valsartan (ENTRESTO) 24-26 MG    Comp Met (CMET)    Pro b natriuretic peptide    Lipid Profile    AMB Referral to [facility]    Lipid Profile    Pro b natriuretic peptide    Comp Met (CMET)    3. Coronary  artery disease involving native coronary artery of native heart without angina pectoris  I25.10 furosemide (LASIX) 20 MG tablet    sacubitril-valsartan (ENTRESTO) 24-26 MG    Comp Met (CMET)    Pro b natriuretic peptide    Lipid Profile    AMB Referral to [facility]    Lipid Profile    Pro b natriuretic peptide    Comp Met (CMET)    4. S/P TAVR (transcatheter aortic valve replacement)  Z95.2 furosemide (LASIX) 20 MG tablet    sacubitril-valsartan (ENTRESTO) 24-26 MG    Comp Met (CMET)    Pro b natriuretic peptide    Lipid Profile    AMB Referral to [facility]    Lipid Profile    Pro b natriuretic peptide    Comp Met (CMET)    5. HFrEF (heart failure with reduced ejection fraction) (HCC)  I50.20 furosemide (LASIX) 20 MG tablet    sacubitril-valsartan (ENTRESTO) 24-26 MG    Comp Met (CMET)    Pro b natriuretic peptide    Lipid Profile    AMB Referral to [facility]    Lipid Profile    Pro b natriuretic peptide    Comp Met (CMET)       ASSESSMENT AND PLAN: .    Madison West is a 67 y.o. female with hyperlipidemia, CAD, prolonged hospital stay in 01/2023 with VF arrest due to acute stent thrombosis, s/p TAVR for severe AS with now recovering EF, h/o right carotid endarterectomy, h/o breast cancer, h/o provoked DVT in Lt internal jugular along with acute superficial vein thrombosis involving the left cephalic vein, proximal upper arm to mid forearm.   CAD: PCI to LAD, complicated by stent thrombosis while on Plavix in the setting of severe AS and HFrEF, leading to VF arrest, treated with thrombectomy and balloon dilatation with excellent results. No anginal symptoms at this time. Given h/o stent thrombosis, I would recommend Brilinta or Effient lifelong.  Continue Brilinta 90 mg twice daily. Currently, she is also on Eliquis for management of DVT. Recommend distal 07/2022, after which I would add aspirin 81 mg daily back to 01/2023.  After that, I would  recommend P2 Y12 monotherapy lifelong. In absence of anginal symptoms, she is not on any other antianginal regimen. LDL 146, lipoprotein a 227 on Crestor 20 mg daily. She has upcoming follow-up with pharmacist to discuss addition of Repatha or Leqvio.  HFrEF: In the setting of severe obstructive disease in LAD and severe AS, both of which have been addressed with PCI and TAVR respectively. EF recovered to 40-45% and spite of minimal tolerance to GDMT. She is currently off all GDMT (Farxiga 10, metoprolol 12.5, Entresto 49-51 mg bid, spironolactone 12.5, due to low blood pressure and lightheadedness symptoms.  This could also be due to  her limited water intake of 1200 cc. Recommend increasing water intake to 2 L daily. She has agreed to resuming Entresto at 24-26 mg twice daily.  She will start this after she returns from her upcoming camping trip this weekend. She may take Lasix 10-20 mg as needed. Will check CMP, proBNP in 2 weeks. She will follow-up with Pharm.D. clinic to assess if uptitration of GDMT is possible. She wishes not to go back on metoprolol.  H/o VF arrest: Occurred in the setting of acute stent thrombosis in 01/2023. It is reasonable to continue amiodarone 200 mg daily for now. I do suspect we may be able to get her off amiodarone eventually, would aim for 07/2022 which would be 6 months since her VF arrest. In the meantime, if her LFTs or TSH are out of order, I believe amiodarone can be discontinued before then.   Meds ordered this encounter  Medications   furosemide (LASIX) 20 MG tablet    Sig: Take 0.5 tablets (10 mg total) by mouth daily as needed (for lower extremity swelling and weight gain).    Dispense:  45 tablet    Refill:  1    Dose decrease   sacubitril-valsartan (ENTRESTO) 24-26 MG    Sig: Take 1 tablet by mouth 2 (two) times daily.    Dispense:  60 tablet    Refill:  3    Dose decrease     F/u in 05/2023  Time spent: 40 minutes I spent 15 minutes  the day of her visit reviewing her records including previous charts and labs, 15 minutes talking with her about her history and in face-to-face counseling and 10 minutes in adjusting medication for a total of 40 minutes spent in patient care.   Signed, Elder Negus, MD

## 2023-04-15 NOTE — Patient Instructions (Signed)
Medication Instructions:   STOP TAKING FARXIGA NOW  STOP TAKING SPIRONOLACTONE NOW  DECREASE YOUR LASIX TO 10 MG BY MOUTH DAILY AS NEEDED FOR LOWER EXTREMITY SWELLING/WEIGHT GAIN  DECREASE YOUR ENTRESTO 24/26 MG DOSE--TAKE 1 TABLET BY MOUTH TWICE DAILY--YOU CAN START THIS WHEN YOU GET BACK FROM YOUR CAMPING TRIP  *If you need a refill on your cardiac medications before your next appointment, please call your pharmacy*   You have been referred to OUR PHARMACIST HERE IN THE OFFICE TO BE SEEN IN 4 WEEKS (LIPIDS SAME DAY) FOR LIPID MANAGEMENT/HEART FAILURE MEDICATION MANAGEMENT    Lab Work:  1.)  IN 2 WEEKS HERE IN THE OFFICE (AROUND NOV 7th)--CMET AND PRO-BNP  2.)  IN 4 WEEKS AT YOUR PHARMACIST APPOINTMENT HERE IN THE OFFICE--PLEASE COME FASTING TO THIS LAB APPOINTMENT  If you have labs (blood work) drawn today and your tests are completely normal, you will receive your results only by: MyChart Message (if you have MyChart) OR A paper copy in the mail If you have any lab test that is abnormal or we need to change your treatment, we will call you to review the results.    Follow-Up:  1.)  IN 4 WEEKS WITH THE PHARMACIST IN LIPID CLINIC FOR LIPID/HEART FAILURE MEDICATION MANAGEMENT--YOU WILL NEED TO HAVE LAB WORK SAME DAY (CHECK LIPIDS)  2.)  IN DECEMBER WITH DR. PATWARDHAN

## 2023-04-19 ENCOUNTER — Telehealth: Payer: Self-pay | Admitting: Cardiology

## 2023-04-19 ENCOUNTER — Ambulatory Visit: Payer: Medicare Other | Admitting: Cardiology

## 2023-04-19 NOTE — Telephone Encounter (Signed)
Is it with every cough or only occasional? Some blood in sputum is not unusually if there is any throat irritation and while on blood thinners. If it is with every coughing episode and perssitent, she may need pulmonary evaluation.  Thanks MJP

## 2023-04-19 NOTE — Telephone Encounter (Signed)
Called patient and gave advisement from Dr. Rosemary Holms. Patient verbalized understanding and will call back if anything gets worse.

## 2023-04-19 NOTE — Telephone Encounter (Signed)
If mild blood-tinged sputum, lets keep an eye on this week.  If any increase, will place pulmonology referral.

## 2023-04-19 NOTE — Telephone Encounter (Signed)
Patient reports that it is not with every cough but with most. For example this morning she has coughed three times and twice there was blood. She said it is only a small amount it is still worrisome to her.

## 2023-04-19 NOTE — Telephone Encounter (Signed)
Patient is calling stating she forgot to mention to Dr. Rosemary Holms at her appointment on 10/24 that when she coughs her mucus has blood in it.   Please advise.

## 2023-04-20 ENCOUNTER — Telehealth: Payer: Self-pay | Admitting: Cardiology

## 2023-04-20 DIAGNOSIS — Z952 Presence of prosthetic heart valve: Secondary | ICD-10-CM

## 2023-04-20 DIAGNOSIS — I2510 Atherosclerotic heart disease of native coronary artery without angina pectoris: Secondary | ICD-10-CM

## 2023-04-20 DIAGNOSIS — E782 Mixed hyperlipidemia: Secondary | ICD-10-CM

## 2023-04-20 DIAGNOSIS — I959 Hypotension, unspecified: Secondary | ICD-10-CM

## 2023-04-20 DIAGNOSIS — I5020 Unspecified systolic (congestive) heart failure: Secondary | ICD-10-CM

## 2023-04-20 MED ORDER — ENTRESTO 24-26 MG PO TABS: 1.0000 | ORAL_TABLET | Freq: Two times a day (BID) | ORAL | 2 refills | Status: DC

## 2023-04-20 NOTE — Telephone Encounter (Signed)
Called pt in regards to concern.  Advised of instructions from MD on 04/15/23.  STOP Farxiga and Spironolactone decrease furosemide to 10 mg PRN every day and decrease Entresto to 24/26 mg PO BID once back from camping. Pt request prescription for Entresto be sent to [facility]; cost is free to pt.  Reports cost at local [facility] is too expensive.  Script sent per pt request.  All questions answered.

## 2023-04-20 NOTE — Telephone Encounter (Signed)
Pt c/o medication issue:  1. Name of Medication:   dapagliflozin propanediol (FARXIGA) tablet 10 mg    spironolactone (ALDACTONE) tablet 25 mg   2. How are you currently taking this medication (dosage and times per day)?   3. Are you having a reaction (difficulty breathing--STAT)?   4. What is your medication issue?   Patient stated she was advised to stop these medications and has started on the lower dose of sacubitril-valsartan (ENTRESTO) 24-26 MG.  Patient stated she back from camping now and wants to know if she needs to resume the two medications.

## 2023-05-10 ENCOUNTER — Encounter
Payer: Medicare Other | Attending: Physical Medicine and Rehabilitation | Admitting: Physical Medicine and Rehabilitation

## 2023-05-10 ENCOUNTER — Ambulatory Visit: Payer: Medicare Other | Admitting: Cardiology

## 2023-05-10 VITALS — BP 109/75 | HR 80 | Ht 68.0 in | Wt 201.0 lb

## 2023-05-10 DIAGNOSIS — G894 Chronic pain syndrome: Secondary | ICD-10-CM | POA: Diagnosis not present

## 2023-05-10 DIAGNOSIS — M6281 Muscle weakness (generalized): Secondary | ICD-10-CM | POA: Insufficient documentation

## 2023-05-10 DIAGNOSIS — G8929 Other chronic pain: Secondary | ICD-10-CM | POA: Diagnosis present

## 2023-05-10 DIAGNOSIS — R5381 Other malaise: Secondary | ICD-10-CM | POA: Diagnosis not present

## 2023-05-10 DIAGNOSIS — M545 Low back pain, unspecified: Secondary | ICD-10-CM | POA: Insufficient documentation

## 2023-05-10 DIAGNOSIS — R042 Hemoptysis: Secondary | ICD-10-CM | POA: Insufficient documentation

## 2023-05-10 DIAGNOSIS — M542 Cervicalgia: Secondary | ICD-10-CM | POA: Diagnosis not present

## 2023-05-10 DIAGNOSIS — R053 Chronic cough: Secondary | ICD-10-CM | POA: Diagnosis not present

## 2023-05-10 DIAGNOSIS — Z5181 Encounter for therapeutic drug level monitoring: Secondary | ICD-10-CM | POA: Insufficient documentation

## 2023-05-10 DIAGNOSIS — R0902 Hypoxemia: Secondary | ICD-10-CM | POA: Diagnosis present

## 2023-05-10 DIAGNOSIS — Z79891 Long term (current) use of opiate analgesic: Secondary | ICD-10-CM | POA: Insufficient documentation

## 2023-05-10 NOTE — Addendum Note (Signed)
Addended by: Sydnee Cabal D on: 05/10/2023 12:44 PM   Modules accepted: Orders

## 2023-05-10 NOTE — Progress Notes (Addendum)
Subjective:    Patient ID: Madison West, female    DOB: [birth year], 67 y.o.   MRN: [id number]  HPI Madison West is a 67 year old woman who presents for hospital follow-up of debility.   1) Debility: -doing well walking -she has returned to bar tending -she also primarily takes care of her house  2) Cough/hemoptysis: -had CXR done but as not gotten results yet  3) History of chest pain/CAD: -discussed her prior symptoms and experience with stent placement  4) Cervical spine pain: -discussed that she got an XR but has not heard the results yet  5) Muscle weakness: -she feels she has in response to statin  Pain Inventory Average Pain 4 Pain Right Now 4 My pain is aching  In the last 24 hours, has pain interfered with the following? General activity 6 Relation with others 1 Enjoyment of life 5 What TIME of day is your pain at its worst? morning  and night Sleep (in general) Good  Pain is worse with: walking, bending, and standing Pain improves with: rest and medication Relief from Meds: 8  walk without assistance how many minutes can you walk? ? ability to climb steps?  yes do you drive?  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  anxiety  Hospital f/u  Hospital f/u    Family History  Problem Relation Age of Onset   Colon cancer Mother 27   Heart disease Father        heart attack   Colon polyps Brother        one brother had large colon polyp that needed surgery to be removed   Heart disease Paternal Grandfather    Diabetes type II Brother    Social History   Socioeconomic History   Marital status: Widowed    Spouse name: Not on file   Number of children: 2   Years of education: Not on file   Highest education level: Not on file  Occupational History   Occupation: server    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  Tobacco Use   Smoking status: Former    Current packs/day: 0.00    Average packs/day: 0.8 packs/day for 20.0 years  (15.0 ttl pk-yrs)    Types: Cigarettes    Start date: 05/06/1977    Quit date: 05/06/1997    Years since quitting: 26.0   Smokeless tobacco: Never  Vaping Use   Vaping status: Never Used  Substance and Sexual Activity   Alcohol use: Not Currently    Comment: has't had a drink in 9 weeks   Drug use: No   Sexual activity: Not Currently  Other Topics Concern   Not on file  Social History Narrative   Not on file   Social Determinants of Health   Financial Resource Strain: Low Risk  (03/10/2023)   Received from [facility]   Overall Financial Resource Strain (CARDIA)    Difficulty of Paying Living Expenses: Not hard at all  Food Insecurity: No Food Insecurity (03/10/2023)   Received from [facility]   Hunger Vital Sign    Worried About Running Out of Food in the Last Year: Never true    Ran Out of Food in the Last Year: Never true  Transportation Needs: No Transportation Needs (03/10/2023)   Received from [facility]    Lack of Transportation (Medical): No    Lack of Transportation (Non-Medical): No  Physical Activity: Not on file  Stress: Not on file  Social Connections: Unknown (03/05/2023)   Received from [facility]   Social Network    Social Network: Not on file   Past Surgical History:  Procedure Laterality Date   APPENDECTOMY     age 45   CESAREAN SECTION     x 2   COLONOSCOPY W/ POLYPECTOMY     COLONOSCOPY WITH PROPOFOL N/A 04/27/2016   Procedure: COLONOSCOPY WITH PROPOFOL;  Surgeon: Charolett Bumpers, MD;  Location: [facility];  Service: Endoscopy;  Laterality: N/A;   CORONARY STENT INTERVENTION N/A 01/29/2023   Procedure: CORONARY STENT INTERVENTION;  Surgeon: Elder Negus, MD;  Location: [facility];  Service: Cardiovascular;  Laterality: N/A;   CORONARY THROMBECTOMY N/A 01/31/2023   Procedure: Coronary Thrombectomy;  Surgeon: Elder Negus, MD;  Location: [facility];  Service: Cardiovascular;   Laterality: N/A;   CORONARY ULTRASOUND/IVUS N/A 01/31/2023   Procedure: Coronary Ultrasound/IVUS;  Surgeon: Elder Negus, MD;  Location: [facility];  Service: Cardiovascular;  Laterality: N/A;   CORONARY/GRAFT ACUTE MI REVASCULARIZATION N/A 01/31/2023   Procedure: Coronary/Graft Acute MI Revascularization;  Surgeon: Elder Negus, MD;  Location: [facility];  Service: Cardiovascular;  Laterality: N/A;   DILATION AND CURETTAGE OF UTERUS     multiple   ENDARTERECTOMY Right 06/09/2019   ENDARTERECTOMY Right 06/09/2019   Procedure: ENDARTERECTOMY CAROTID RIGHT;  Surgeon: Sherren Kerns, MD;  Location: [facility] OR;  Service: Vascular;  Laterality: Right;   IABP INSERTION N/A 02/02/2023   Procedure: IABP Insertion;  Surgeon: Elder Negus, MD;  [facility]: [facility];  Service: Cardiovascular;  Laterality: N/A;   INTRAUTERINE DEVICE INSERTION     IUD REMOVAL     LEFT HEART CATH AND CORONARY ANGIOGRAPHY N/A 02/27/2020   Procedure: LEFT HEART CATH AND CORONARY ANGIOGRAPHY;  Surgeon: Yates Decamp, MD;  [facility]: [facility];  Service: Cardiovascular;  Laterality: N/A;   LEFT HEART CATH AND CORONARY ANGIOGRAPHY N/A 01/31/2023   Procedure: LEFT HEART CATH AND CORONARY ANGIOGRAPHY;  Surgeon: Elder Negus, MD;  [facility]: [facility];  Service: Cardiovascular;  Laterality: N/A;   MASTECTOMY PARTIAL / LUMPECTOMY W/ AXILLARY LYMPHADENECTOMY Right    lumpectomy and lymph nodes removed   PATCH ANGIOPLASTY Right 06/09/2019   Procedure: Patch Angioplasty of right carotid artery using hemashield paltinum finesse patch;  Surgeon: Sherren Kerns, MD;  Location: [facility] OR;  Service: Vascular;  Laterality: Right;   RIGHT AND LEFT HEART CATH N/A 02/01/2023   Procedure: RIGHT AND LEFT HEART CATH;  Surgeon: Elder Negus, MD;  [facility];  Service: Cardiovascular;  Laterality: N/A;   RIGHT HEART CATH N/A 02/02/2023   Procedure: RIGHT HEART  CATH;  Surgeon: Dolores Patty, MD;  [facility]: [facility];  Service: Cardiovascular;  Laterality: N/A;   RIGHT/LEFT HEART CATH AND CORONARY ANGIOGRAPHY N/A 01/27/2023   Procedure: RIGHT/LEFT HEART CATH AND CORONARY ANGIOGRAPHY;  Surgeon: Elder Negus, MD;  [facility];  Service: Cardiovascular;  Laterality: N/A;   TEE WITHOUT CARDIOVERSION N/A 02/04/2023   Procedure: TRANSESOPHAGEAL ECHOCARDIOGRAM;  Surgeon: Orbie Pyo, MD;  Location: [facility] INVASIVE CV LAB;  Service: Open Heart Surgery;  Laterality: N/A;   TEMPORARY PACEMAKER N/A 02/02/2023   Procedure: TEMPORARY PACEMAKER;  Surgeon: Dolores Patty, MD;  [facility]: [facility];  Service: Cardiovascular;  Laterality: N/A;   TRANSCATHETER AORTIC VALVE REPLACEMENT, TRANSFEMORAL N/A  02/04/2023   Procedure: Transcatheter Aortic Valve Replacement, Transfemoral;  Surgeon: Orbie Pyo, MD;  Location: [facility] INVASIVE CV LAB;  Service: Open Heart Surgery;  Laterality: N/A;   TUBAL LIGATION     VULVA SURGERY     Multiple times for dysplasia   Past Medical History:  Diagnosis Date   Anxiety    Arthritis    low back and hip pain intermittent   Breast cancer (HCC) 06/02/07   r breast -surgery ,radiaology. chemotherapy   Carpal tunnel syndrome    right hand   Colon polyps    Complication of anesthesia    Fentanyl, Versed-makes extra hyper, bradycardia x 1 in PACU, [facility] (08/15/11 cardiology felt neostigmine may have resulted in AV nodal block)    Coronary artery disease    Depression    denies   Dysplasia of vulva    Hypertension    Palpitations    PSVT, s/p adenosine 08/04/16   S/P breast lumpectomy 07/04/07   R breast   S/P radiation therapy 2009   Ht 5\' 8"  (1.727 m)   Wt 201 lb (91.2 kg)   BMI 30.56 kg/m   Opioid Risk Score:   Fall Risk Score:  `1  Depression screen PHQ 2/9     01/19/2020    1:33 PM 10/04/2019    2:40 PM 07/03/2019   10:46 AM 02/07/2019    2:16 PM 05/24/2018    2:52 PM  10/15/2017    1:36 PM 04/06/2017   11:57 AM  Depression screen PHQ 2/9  Decreased Interest 0 0 1 0 0 0 0  Down, Depressed, Hopeless 0 0 1 0 0 0 0  PHQ - 2 Score 0 0 2 0 0 0 0  Altered sleeping   0      Tired, decreased energy   1      Change in appetite   0      Feeling bad or failure about yourself    0      Trouble concentrating   0      Moving slowly or fidgety/restless   0      Suicidal thoughts   0      PHQ-9 Score   3         Review of Systems  Respiratory:  Positive for cough and wheezing.   Musculoskeletal:  Positive for back pain.  Psychiatric/Behavioral:  The patient is nervous/anxious.   All other systems reviewed and are negative.     Objective:   Physical Exam   Gen: no distress, normal appearing HEENT: oral mucosa pink and moist, NCAT Cardio: Reg rate Chest: normal effort, normal rate of breathing Abd: soft, non-distended Ext: no edema Psych: pleasant, normal affect Skin: intact Neuro: Alert and oriented x3 MSK: 5/5 strength throughout, TTP lower back, limited lumbar spine ROM     Assessment & Plan:   1) Debility: -discussed that she is thankful for her recovery -discussed that she has returned to work -discussed that se has established care with a new PCP  2) Chronic cough/hemoptysis: -discussed that she followed up with her PCP -discussed that she had a CXR  -referred to pulmonology  3) Cervical spine pain -discussed that flexed posture can contribute greatly -  4) Muscle weakness: -discussed that she thinks this is 2/2 to her statin -recommended Coenzyme Q10, discussed that she used to take this in the past -continue Crestor -discussed that she has aching in her legs and feels she has been run  over by a truck  5) History of chest pain/CAD: -discussed her prior symptoms and stent placement  6) Desaturation: -desatting to 87% -discussed that she feels well -referred to pulmonology   7) Chronic low back pain -UDS and pain contract  performed today -discussed that she may need oxycodone 2-3 x/week 5mg  oxycodone  >40 minutes spent in discussion of her debility, chronic cough, cervical spine pain, muscle weakness, discussed that she feels the muscle weakness is secondary to the statin, discussed that she is currently on [place]rest[place] 20mg  daily, recommended Coenzyme Q10, commended on following up with PCP regarding chronic cough, discussed that she has returned to work and is managing her home, discussed that she feels flexed posture contributes to her cervical spine pain, discussed her prior symptoms and stent placement, discussed her chronic back pain, UDS and pain contract performed today, discussed monthly to bimonthly appointments required for schedule 2 medications, discussed that these appointments would be with our nurse practioner Jacalyn Lefevre,

## 2023-05-10 NOTE — Addendum Note (Signed)
Addended by: Horton Chin on: 05/10/2023 12:39 PM   Modules accepted: Level of Service

## 2023-05-12 NOTE — Progress Notes (Signed)
Patient ID: Madison West                 DOB: [birth year]                      MRN: [id number]     HPI: Madison West is a 67 y.o. female referred by Dr. Rosemary Holms to pharmacy clinic for HF medication management. PMH is significant for HTN, CAD s/p carotid endarterectomy (2020), RI stent (2021), PCI > STEMI (Aug 2024), HFrEF, VF arrest 2/2 stent-thrombosis, DVT (on Eliquis), breast cancer (s/p lumpectomy and radiation in 2009), HLD. Most recent LVEF 40-45% with global hypokinesis, mild MVR, improved from 20-25% in Aug 2024 s/p TAVR 01/26/23.   Most recently seen by Dr. Rosemary Holms on 04/15/23. She had a prolonged hospitalization from 01/23/28 to 02/18/23. She underwent stent placement on 8/7 for 95% occluded LAD lesion, then had pulseless VT with ROSC, then recurrent VT with EKG demonstrating STEMI due to stent thrombosis - treated with thrombectomy, IABP placement. Underwent TAVR on 8/15. Hospital course further complicated by PNA, DVT. 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 Reported recent low BP (80s/40s) with lightheadedness. She discontinued metoprolol, [name], Entresto, and spironolactone. She endorses intolerances to these medications - has a history of being hesitant to take medications. Reported she was only drink 1200 cc of water/day. BP in clinic 124/72 (off medication), HR 79. Noted LDL of 146 mg/dL, Lp(a) of 409 on rosuvastatin 20. Referred for titration of HF medications and discussion of additional lipid-lowering medication. At last visit, agreed to resume Entresto 24-26 mg PO BID. At her physical rehab appt on 11/18 she reported muscle weakness, which she feels is related to her statin (aching in her legs, feels she has been run over by a truck). She also called in reporting blood-tinged sputum, O2 to 87% - referred to pulmonology. Gets her medications from [facility].  Today patient brings her medications with her for review. She resumed  Entresto ~10 days ago. Has only had a couple episodes of dizziness. About 2 nights ago she did get very dizzy while washing dishes, but otherwise she has not had issues. She checks her blood pressure every day - reports that it has been low-normal. Describes muscle pain which she has been experiencing since being discharged from the hospital - severe pain in shoulders, hips, legs. Especially feels that her joints are very stiff when she wakes up and when she has to get out of the car after driving. She was prescribed atorvastatin in the past, but she never took the medication because she is a "drug-o-phobe" and has anxiety about taking medications and side effects. She has very minimal LE swelling on exam. She denies ShOB and chest pain. She is only taking 1/2 of lasix occasionally, as her ankles get a little swollen while working. She is checking daily weights - 201 lbs today. Appetite has been ok - she is eating small portions of meals as the people in her house do not follow a heart healthy diet. Reviewed options for lowering LDL cholesterol, including ezetimibe, PCSK-9 inhibitors, and bempedoic acid.  Discussed mechanisms of action, dosing, side effects and potential decreases in LDL cholesterol.  Also reviewed cost information and potential options for patient assistance.   Her primary complaint is a cough and blood tinged sputum - states she had a clear CXR at [facility] and is going to be worked up for spinal imaging for  neck nodule w/ HA. She is mainly having the cough in the morning, but coughed a few times during the visit. Deep, wet, cough.   Hyperlipidemia Current Medications: rosuvastatin 20 mg PO daily Intolerances: atorvastatin 10 mg  Risk Factors: progressive CAD, former smoker, age LDL-C goal: <55 mg/dL ApoB goal: <16 mg/dL  Current CHF meds: furosemide 10 mg PO daily PRN, Entresto 24-26 mg PO BID Previously tried: spironolactone 12.5, Farxiga 10  Adherence Assessment  Do you ever forget  to take your medication? [] Yes [x] No  Do you ever skip doses due to side effects? [] Yes [x] No  Do you have trouble affording your medicines? - free at the [place] (mail order) [] Yes [x] No  Are you ever unable to pick up your medication due to transportation difficulties? [] Yes [x] No  Do you ever stop taking your medications because you don't believe they are helping? [x] Yes [] No  Do you check your weight daily? [x] Yes [] No   Adherence strategy: did not discuss today  Barriers to obtaining medications: Prefers to get medications through [facility] mail order because they are free  BP goal: <130/80 mmHg  Family History:  Family History  Problem Relation Age of Onset   Colon cancer Mother 85   Heart disease Father        heart attack   Colon polyps Brother        one brother had large colon polyp that needed surgery to be removed   Heart disease Paternal Grandfather    Diabetes type II Brother     Social History: Works part time as a Leisure centre manager. Former smoker- ~15 pack yr hx. Denies current alcohol use.  Diet: Reports that the people in her house do not follow a heart healthy diet. Last night she had a small serving of fettuccine alfredo for dinner. She does not add salt to her foods.   Exercise: Staying active at work ([employer]). Did not discuss exercise regimen.   Home BP readings: Does not recall many readings. Her BP cuff is in the car. This morning thinks she was placing cuff incorrectly because first readings with SBP 80 and 90s, then she correctly placed the cuff and got ~117/78?Marland Kitchen Denies recent SBP <100.   Wt Readings from Last 3 Encounters:  05/10/23 201 lb (91.2 kg)  04/15/23 199 lb 12.8 oz (90.6 kg)  03/17/23 201 lb 3.2 oz (91.3 kg)   BP Readings from Last 3 Encounters:  05/10/23 109/75  04/15/23 124/72  03/17/23 104/66   Pulse Readings from Last 3 Encounters:  05/10/23 80  04/15/23 79  03/17/23 73    Renal function: CrCl cannot be calculated (Patient's most recent  lab result is older than the maximum 21 days allowed.).  Labs: Lipid Panel     Component Value Date/Time   CHOL 220 (H) 12/30/2022 1604   TRIG 91 02/04/2023 0207   HDL 51 12/30/2022 1604   CHOLHDL 4.2 01/19/2020 1434   CHOLHDL 6.7 12/17/2011 0949   VLDL 48 (H) 12/17/2011 0949   LDLCALC 146 (H) 12/30/2022 1604   LABVLDL 23 12/30/2022 1604   Lp(a) 227.2 mg/dL (1/0/96)  Past Medical History:  Diagnosis Date   Anxiety    Arthritis    low back and hip pain intermittent   Breast cancer (HCC) 06/02/07   r breast -surgery ,radiaology. chemotherapy   Carpal tunnel syndrome    right hand   Colon polyps    Complication of anesthesia    Fentanyl, Versed-makes extra hyper, bradycardia x 1 in  PACU, [facility] (08/15/11 cardiology felt neostigmine may have resulted in AV nodal block)    Coronary artery disease    Depression    denies   Dysplasia of vulva    Hypertension    Palpitations    PSVT, s/p adenosine 08/04/16   S/P breast lumpectomy 07/04/07   R breast   S/P radiation therapy 2009    Current Outpatient Medications on File Prior to Visit  Medication Sig Dispense Refill   amiodarone (PACERONE) 200 MG tablet Take 1 tablet (200 mg total) by mouth daily. 30 tablet 0   apixaban (ELIQUIS) 5 MG TABS tablet Take 1 tablet (5 mg total) by mouth 2 (two) times daily. 60 tablet 6   busPIRone (BUSPAR) 10 MG tablet Take 1 tablet (10 mg total) by mouth 2 (two) times daily. 60 tablet 0   clopidogrel (PLAVIX) 75 MG tablet Take 1 tablet (75 mg total) by mouth daily. 90 tablet 3   gabapentin (NEURONTIN) 300 MG capsule Take 1 capsule (300 mg total) by mouth 3 (three) times daily. 90 capsule 0   methocarbamol (ROBAXIN) 500 MG tablet Take 1 tablet (500 mg total) by mouth 2 (two) times daily. 60 tablet 0   acetaminophen (TYLENOL) 325 MG tablet Take 2 tablets (650 mg total) by mouth every 6 (six) hours. (Patient taking differently: Take 500 mg by mouth every 6 (six) hours.)     alprazolam (XANAX) 2 MG  tablet Take 0.25-2 mg by mouth See admin instructions. 1 mg (may take up to 2 mg) twice daily. May also take 0.25 mg during the day if needed for anxiety.     amoxicillin (AMOXIL) 500 MG tablet Take 4 tablets (2,000 mg total) by mouth as directed. 1 hour prior to dental work including cleanings 12 tablet 12   dextromethorphan-guaiFENesin (MUCINEX DM) 30-600 MG 12hr tablet Take 1 tablet by mouth 2 (two) times daily.     furosemide (LASIX) 20 MG tablet Take 0.5 tablets (10 mg total) by mouth daily as needed (for lower extremity swelling and weight gain). 45 tablet 1   nitroGLYCERIN (NITROSTAT) 0.4 MG SL tablet Place 1 tablet (0.4 mg total) under the tongue every 5 (five) minutes as needed for chest pain. 25 tablet 2   oxyCODONE (OXY IR/ROXICODONE) 5 MG immediate release tablet Take 1 tablet (5 mg total) by mouth every 4 (four) hours as needed for moderate pain or severe pain. (Patient not taking: Reported on 05/10/2023) 30 tablet 0   rosuvastatin (CRESTOR) 20 MG tablet Take 1 tablet (20 mg total) by mouth daily. 90 tablet 3   Vitamin D, Ergocalciferol, (DRISDOL) 1.25 MG (50000 UNIT) CAPS capsule Take 1 capsule (50,000 Units total) by mouth every 7 (seven) days. Take each Friday until finished. 5 capsule 0   No current facility-administered medications on file prior to visit.    Allergies  Allergen Reactions   Prednisone Palpitations   Fentanyl Swelling and Other (See Comments)    Makes pt "hyper" feels she is "climbing the walls."  Last time she had a colonoscopy with Fentanyl and Versed she was awake the whole time.   Versed [Midazolam] Swelling and Anxiety    Makes pt "hyper" feels she is "climbing the walls."  Last time she had a colonoscopy with Fentanyl and Versed she was awake the whole time.   Vicodin [Hydrocodone-Acetaminophen] Anxiety    Extreme anxiety.  OK to take oxycodone.     Assessment/Plan:  CHF (congestive heart failure) (HCC) Assessment - NYHA Class II-III  Stage C  HFimpEF (EF 40-45% in Sep 2024). Possibly with mild hypervolemia given minimal LE edema on exam, wet cough - though weights are stable.  - Pt with low clinic BP without s/sx of hypotension. She is very hesitant to take medications. Discussed importance of HF GDMT for reducing risk of mortality and exacerbations. With soft BP, will transition to less potent ARB, losartan to facilitate addition of SGLT2i, which should have minimal impact on BP.  - Patient aware of lifestyle interventions to prevent HF exacerbations including limiting sodium intake, fluid intake to 2L/day.   Plan - Stop Entresto (sacubitril/valsartan) - START losartan 12.5 mg PO daily - START Farxiga (dapagliflozin) 10 mg PO daily - Continue furosemide 10 mg daily PRN swelling or weight gain - Continue to monitor BP at home and take daily weights   Mixed hyperlipidemia Assessment - LDL-C of 146 mg/dL uncontrolled above goal <55 mg/dL given progressive CAD and Lp(a) 227 mg/dL.  Lipid panel has not been rechecked since patient was initiated on rosuvastatin during hospitalization in Aug 2024.  - Patient complaining of statin induced muscle aches/myalgias today. Describes bilateral pain in large muscle groups (legs, shoulders, hips) and joint stiffness, limb heaviness c/w statin-induced myalgias. She has not tried any other statins. Appropriate to trial dose-reduction, followed by trial of alternative statin.  - Patient would be an excellent candidate for addition of PCSK9i mAb to achieve LDL-C goal - however she is very hesitant to start injectable medication with long half life. Discuss bempedoic acid/ezetimibe (Nexlizet) as well - unknown uric acid level or gout hx.  - Patient was not able to stay to continue discussion on non-statin agents to lower cholesterol. Will continue conversation at follow-up.   Plan - DECREASE rosuvastatin to 10 mg (1/2 tab) PO daily - Provided patient with names of potential add-on medications to lower  cholesterol (Repatha, Praluent, Nexlizet) - Obtain repeat lipid panel at follow-up   Return visit in 1 month.  Thank you   Nils Pyle, PharmD PGY1 Pharmacy Resident  Olene Floss, Pharm.D, BCACP, BCPS, CPP [facility] [address] [place], [place] [address]  Phone: [phone]; Fax: [phone]

## 2023-05-13 ENCOUNTER — Ambulatory Visit: Payer: Medicare Other | Attending: Cardiovascular Disease | Admitting: Pharmacist

## 2023-05-13 VITALS — BP 104/68 | HR 80

## 2023-05-13 DIAGNOSIS — I5042 Chronic combined systolic (congestive) and diastolic (congestive) heart failure: Secondary | ICD-10-CM | POA: Insufficient documentation

## 2023-05-13 DIAGNOSIS — E782 Mixed hyperlipidemia: Secondary | ICD-10-CM | POA: Diagnosis present

## 2023-05-13 DIAGNOSIS — I10 Essential (primary) hypertension: Secondary | ICD-10-CM | POA: Diagnosis not present

## 2023-05-13 LAB — TOXASSURE SELECT,+ANTIDEPR,UR

## 2023-05-13 MED ORDER — DAPAGLIFLOZIN PROPANEDIOL 10 MG PO TABS: 10.0000 mg | ORAL_TABLET | Freq: Every day | ORAL | 3 refills | Status: DC

## 2023-05-13 MED ORDER — ROSUVASTATIN CALCIUM 20 MG PO TABS: 10.0000 mg | ORAL_TABLET | Freq: Every day | ORAL | Status: DC

## 2023-05-13 MED ORDER — LOSARTAN POTASSIUM 25 MG PO TABS: 12.5000 mg | ORAL_TABLET | Freq: Every day | ORAL | 3 refills | Status: DC

## 2023-05-13 MED ORDER — LOSARTAN POTASSIUM 25 MG PO TABS: 12.5000 mg | ORAL_TABLET | Freq: Every day | ORAL | 1 refills | Status: DC

## 2023-05-13 NOTE — Assessment & Plan Note (Addendum)
Assessment - LDL-C of 146 mg/dL uncontrolled above goal <55 mg/dL given progressive CAD and Lp(a) 227 mg/dL.  Lipid panel has not been rechecked since patient was initiated on rosuvastatin during hospitalization in Aug 2024.  - Patient complaining of statin induced muscle aches/myalgias today. Describes bilateral pain in large muscle groups (legs, shoulders, hips) and joint stiffness, limb heaviness c/w statin-induced myalgias. She has not tried any other statins. Appropriate to trial dose-reduction, followed by trial of alternative statin.  - Patient would be an excellent candidate for addition of PCSK9i mAb to achieve LDL-C goal - however she is very hesitant to start injectable medication with long half life. Discuss bempedoic acid/ezetimibe (Nexlizet) as well - unknown uric acid level or gout hx.  - Patient was not able to stay to continue discussion on non-statin agents to lower cholesterol. Will continue conversation at follow-up.   Plan - DECREASE rosuvastatin to 10 mg (1/2 tab) PO daily - Provided patient with names of potential add-on medications to lower cholesterol (Repatha, Praluent, Nexlizet) - Obtain repeat lipid panel at follow-up

## 2023-05-13 NOTE — Patient Instructions (Addendum)
It was nice to meet you today!  Since your blood pressures are running low, we would like you to STOP Entresto and START losartan 12.5 mg (1/2 tablet) once daily.   Please restart Farxiga 10 mg once daily to help keep fluid off.   Let us know if your blood pressure (top number) is less than 100.   For your cholesterol, cut your rosuvastatin in half and take 10 mg once daily. See if you have any improvement in your muscle symptoms. We will schedule a follow-up appointment to further discuss options for your cholesterol. The two medicines we talked about today are called Repatha (evolocumab) or Praluent (alirocumab) - which are the injectable options, OR the oral medication Nexlizet (bempedoic acid and ezetimibe)

## 2023-05-13 NOTE — Assessment & Plan Note (Signed)
Assessment - NYHA Class II-III Stage C HFimpEF (EF 40-45% in Sep 2024). Possibly with mild hypervolemia given minimal LE edema on exam, wet cough - though weights are stable.  - Pt with low clinic BP without s/sx of hypotension. She is very hesitant to take medications. Discussed importance of HF GDMT for reducing risk of mortality and exacerbations. With soft BP, will transition to less potent ARB, losartan to facilitate addition of SGLT2i, which should have minimal impact on BP.  - Patient aware of lifestyle interventions to prevent HF exacerbations including limiting sodium intake, fluid intake to 2L/day.   Plan - Stop Entresto (sacubitril/valsartan) - START losartan 12.5 mg PO daily - START Farxiga (dapagliflozin) 10 mg PO daily - Continue furosemide 10 mg daily PRN swelling or weight gain - Continue to monitor BP at home and take daily weights

## 2023-05-14 ENCOUNTER — Other Ambulatory Visit: Payer: Self-pay | Admitting: Physical Medicine and Rehabilitation

## 2023-05-14 MED ORDER — OXYCODONE HCL 5 MG PO TABS: 5.0000 mg | ORAL_TABLET | Freq: Every day | ORAL | 0 refills | Status: DC | PRN

## 2023-05-16 ENCOUNTER — Other Ambulatory Visit: Payer: Self-pay

## 2023-05-16 ENCOUNTER — Encounter: Payer: Self-pay | Admitting: Cardiology

## 2023-05-16 ENCOUNTER — Encounter (HOSPITAL_BASED_OUTPATIENT_CLINIC_OR_DEPARTMENT_OTHER): Payer: Self-pay | Admitting: Emergency Medicine

## 2023-05-16 ENCOUNTER — Emergency Department (HOSPITAL_BASED_OUTPATIENT_CLINIC_OR_DEPARTMENT_OTHER)
Admission: EM | Admit: 2023-05-16 | Discharge: 2023-05-16 | Disposition: A | Discharge status: Home / Self Care | Payer: Medicare Other | Attending: Emergency Medicine | Admitting: Emergency Medicine

## 2023-05-16 ENCOUNTER — Emergency Department (HOSPITAL_BASED_OUTPATIENT_CLINIC_OR_DEPARTMENT_OTHER): Payer: Medicare Other

## 2023-05-16 DIAGNOSIS — Z7901 Long term (current) use of anticoagulants: Secondary | ICD-10-CM | POA: Diagnosis not present

## 2023-05-16 DIAGNOSIS — Z7902 Long term (current) use of antithrombotics/antiplatelets: Secondary | ICD-10-CM | POA: Diagnosis not present

## 2023-05-16 DIAGNOSIS — K047 Periapical abscess without sinus: Secondary | ICD-10-CM | POA: Insufficient documentation

## 2023-05-16 DIAGNOSIS — R519 Headache, unspecified: Secondary | ICD-10-CM | POA: Insufficient documentation

## 2023-05-16 DIAGNOSIS — M542 Cervicalgia: Secondary | ICD-10-CM | POA: Insufficient documentation

## 2023-05-16 DIAGNOSIS — K0889 Other specified disorders of teeth and supporting structures: Secondary | ICD-10-CM | POA: Diagnosis present

## 2023-05-16 LAB — CBC WITH DIFFERENTIAL/PLATELET
Abs Immature Granulocytes: 0.01 10*3/uL (ref 0.00–0.07)
Basophils Absolute: 0.1 10*3/uL (ref 0.0–0.1)
Basophils Relative: 1 %
Eosinophils Absolute: 0.5 10*3/uL (ref 0.0–0.5)
Eosinophils Relative: 6 %
HCT: 42.8 % (ref 36.0–46.0)
Hemoglobin: 13.5 g/dL (ref 12.0–15.0)
Immature Granulocytes: 0 %
Lymphocytes Relative: 19 %
Lymphs Abs: 1.6 10*3/uL (ref 0.7–4.0)
MCH: 29 pg (ref 26.0–34.0)
MCHC: 31.5 g/dL (ref 30.0–36.0)
MCV: 92 fL (ref 80.0–100.0)
Monocytes Absolute: 0.6 10*3/uL (ref 0.1–1.0)
Monocytes Relative: 7 %
Neutro Abs: 5.5 10*3/uL (ref 1.7–7.7)
Neutrophils Relative %: 67 %
Platelets: 336 10*3/uL (ref 150–400)
RBC: 4.65 MIL/uL (ref 3.87–5.11)
RDW: 14.3 % (ref 11.5–15.5)
WBC: 8.3 10*3/uL (ref 4.0–10.5)
nRBC: 0 % (ref 0.0–0.2)

## 2023-05-16 LAB — BASIC METABOLIC PANEL
Anion gap: 9 (ref 5–15)
BUN: 11 mg/dL (ref 8–23)
CO2: 25 mmol/L (ref 22–32)
Calcium: 8.9 mg/dL (ref 8.9–10.3)
Chloride: 103 mmol/L (ref 98–111)
Creatinine, Ser: 1.08 mg/dL — ABNORMAL HIGH (ref 0.44–1.00)
GFR, Estimated: 56 mL/min — ABNORMAL LOW (ref >60)
Glucose, Bld: 104 mg/dL — ABNORMAL HIGH (ref 70–99)
Potassium: 4.4 mmol/L (ref 3.5–5.1)
Sodium: 137 mmol/L (ref 135–145)

## 2023-05-16 MED ORDER — AMOXICILLIN 500 MG PO CAPS: 500.0000 mg | ORAL_CAPSULE | Freq: Three times a day (TID) | ORAL | 0 refills | Status: DC

## 2023-05-16 MED ORDER — OXYCODONE-ACETAMINOPHEN 5-325 MG PO TABS
1.0000 | ORAL_TABLET | Freq: Once | ORAL | Status: AC
  Administered 2023-05-16: 1 via ORAL
  Filled 2023-05-16: qty 1

## 2023-05-16 MED ORDER — HYDROCODONE-ACETAMINOPHEN 5-325 MG PO TABS
1.0000 | ORAL_TABLET | Freq: Once | ORAL | Status: DC
Start: 2023-05-16 — End: 2023-05-16

## 2023-05-16 MED ORDER — OXYCODONE-ACETAMINOPHEN 5-325 MG PO TABS: 1.0000 | ORAL_TABLET | Freq: Four times a day (QID) | ORAL | 0 refills | Status: DC | PRN

## 2023-05-16 MED ORDER — IOHEXOL 350 MG/ML SOLN
75.0000 mL | Freq: Once | INTRAVENOUS | Status: AC | PRN
  Administered 2023-05-16: 75 mL via INTRAVENOUS

## 2023-05-16 NOTE — Discharge Instructions (Addendum)
Take the medications as prescribed  Follow up with a dentist  Return for new or worsening symptoms

## 2023-05-16 NOTE — ED Provider Notes (Signed)
[place] [facility] Provider Note   CSN: [id number] Arrival date & time: 05/16/23  1902    History  Chief Complaint  Patient presents with   Dental Pain    Madison West is a 67 y.o. female multiple medical problems here for evaluation of dental pain.  Located to right upper dentition.  She is also noted that since her pain has worsened and she has developed a headache on the right side which goes into her neck.  No vision changes, numbness, neck tenderness, no fever, pain not worse with range of motion.  No recent chiropractic activity.  No chest pain, shortness of breath, pain or swelling to extremities.  She has been able to eat and drink at home.  She took a leftover Percocet that she had from a prior procedure which helped her pain.  She also took 2 doses of amoxicillin which she had at home which was leftover.  She states she has not seen a dentist since 2007. No facial swelling.  HPI     Home Medications Prior to Admission medications   Medication Sig Start Date End Date Taking? Authorizing Provider  amoxicillin (AMOXIL) 500 MG capsule Take 1 capsule (500 mg total) by mouth 3 (three) times daily. 05/16/23  Yes Onnie Alatorre A, PA-C  oxyCODONE-acetaminophen (PERCOCET/ROXICET) 5-325 MG tablet Take 1 tablet by mouth every 6 (six) hours as needed for severe pain (pain score 7-10). 05/16/23  Yes Rad Gramling A, PA-C  acetaminophen (TYLENOL) 325 MG tablet Take 2 tablets (650 mg total) by mouth every 6 (six) hours. Patient taking differently: Take 500 mg by mouth every 6 (six) hours. 03/05/23   Setzer, Lynnell Jude, PA-C  alprazolam Prudy Feeler) 2 MG tablet Take 0.25-2 mg by mouth See admin instructions. 1 mg (may take up to 2 mg) twice daily. May also take 0.25 mg during the day if needed for anxiety.    [provider]  amiodarone (PACERONE) 200 MG tablet Take 1 tablet (200 mg total) by mouth daily. 03/25/23   Patwardhan, Anabel Bene, MD   apixaban (ELIQUIS) 5 MG TABS tablet Take 1 tablet (5 mg total) by mouth 2 (two) times daily. 03/17/23   Janetta Hora, PA-C  busPIRone (BUSPAR) 10 MG tablet Take 1 tablet (10 mg total) by mouth 2 (two) times daily. 03/15/23   Raulkar, Drema Pry, MD  clopidogrel (PLAVIX) 75 MG tablet Take 1 tablet (75 mg total) by mouth daily. 03/17/23   Janetta Hora, PA-C  dapagliflozin propanediol (FARXIGA) 10 MG TABS tablet Take 1 tablet (10 mg total) by mouth daily before breakfast. 05/13/23   Patwardhan, Anabel Bene, MD  dextromethorphan-guaiFENesin (MUCINEX DM) 30-600 MG 12hr tablet Take 1 tablet by mouth 2 (two) times daily. 03/15/23   Raulkar, Drema Pry, MD  furosemide (LASIX) 20 MG tablet Take 0.5 tablets (10 mg total) by mouth daily as needed (for lower extremity swelling and weight gain). 04/15/23   Patwardhan, Anabel Bene, MD  gabapentin (NEURONTIN) 300 MG capsule Take 1 capsule (300 mg total) by mouth 3 (three) times daily. 03/15/23   Raulkar, Drema Pry, MD  losartan (COZAAR) 25 MG tablet Take 0.5 tablets (12.5 mg total) by mouth daily. 05/13/23   Patwardhan, Anabel Bene, MD  methocarbamol (ROBAXIN) 500 MG tablet Take 1 tablet (500 mg total) by mouth 2 (two) times daily. 03/15/23   Raulkar, Drema Pry, MD  nitroGLYCERIN (NITROSTAT) 0.4 MG SL tablet Place 1 tablet (0.4 mg total) under the  tongue every 5 (five) minutes as needed for chest pain. 03/15/23   Raulkar, Drema Pry, MD  oxyCODONE (OXY IR/ROXICODONE) 5 MG immediate release tablet Take 1 tablet (5 mg total) by mouth daily as needed for moderate pain (pain score 4-6) or severe pain (pain score 7-10). 05/14/23   Raulkar, Drema Pry, MD  rosuvastatin (CRESTOR) 20 MG tablet Take 0.5 tablets (10 mg total) by mouth daily. 05/13/23   Patwardhan, Anabel Bene, MD  Vitamin D, Ergocalciferol, (DRISDOL) 1.25 MG (50000 UNIT) CAPS capsule Take 1 capsule (50,000 Units total) by mouth every 7 (seven) days. Take each Friday until finished. 03/15/23   Raulkar, Drema Pry, MD       Allergies    Prednisone, Fentanyl, Versed [midazolam], and Vicodin [hydrocodone-acetaminophen]    Review of Systems   Review of Systems  Constitutional: Negative.   HENT:  Positive for dental problem. Negative for congestion, ear discharge, ear pain, facial swelling, sinus pressure, sneezing, sore throat, tinnitus, trouble swallowing and voice change.   Respiratory: Negative.    Cardiovascular: Negative.   Gastrointestinal: Negative.   Genitourinary: Negative.   Musculoskeletal:  Positive for neck pain. Negative for arthralgias, back pain, gait problem, joint swelling, myalgias and neck stiffness.  Skin: Negative.   Neurological:  Positive for headaches. Negative for dizziness, tremors, seizures, syncope, facial asymmetry, speech difficulty, weakness, light-headedness and numbness.  All other systems reviewed and are negative.   Physical Exam Updated Vital Signs BP (!) 176/97   Pulse 88   Temp 99 F (37.2 C)   Resp 20   Ht 5\' 8"  (1.727 m)   Wt 90.7 kg   SpO2 95%   BMI 30.41 kg/m  Physical Exam Physical Exam  Constitutional: Pt is oriented to person, place, and time. Pt appears well-developed and well-nourished. No distress.  HENT:  Head: Normocephalic and atraumatic.   Mouth/Throat: Oropharynx is clear and moist.  Poor dentition, multiple missing teeth.  Teeth eroded to the gumline throughout.  She has gingival erythema to right upper dentition as well as exposed root and multiple teeth.  Sublingual area soft.  No facial swelling.  No submandibular swelling, tongue midline   Eyes: Conjunctivae and EOM are normal. Pupils are equal, round, and reactive to light. No scleral icterus.  No horizontal, vertical or rotational nystagmus  Neck: Normal range of motion. Neck supple.  Full active and passive ROM without pain No midline or paraspinal tenderness No nuchal rigidity or meningeal signs  Cardiovascular: Normal rate, regular rhythm and intact distal pulses.    Pulmonary/Chest: Effort normal and breath sounds normal. No respiratory distress. Pt has no wheezes. No rales.  Abdominal: Soft. Bowel sounds are normal. There is no tenderness. There is no rebound and no guarding.  Musculoskeletal: Normal range of motion.  Lymphadenopathy:    No cervical adenopathy.  Neurological: Pt. is alert and oriented to person, place, and time. He has normal reflexes. No cranial nerve deficit.  Exhibits normal muscle tone. Coordination normal.  Mental Status:  Alert, oriented, thought content appropriate. Speech fluent without evidence of aphasia. Able to follow 2 step commands without difficulty.  Cranial Nerves:  2-12 grossly intact Motor:  Strength bilateral upper and lower extremities Sensory: intact sensation bilaterally Cerebellar: normal F2N Gait: normal gait and balance CV: distal pulses palpable throughout   Skin: Skin is warm and dry. No rash noted. Pt is not diaphoretic.  Psychiatric: Pt has a normal mood and affect. Behavior is normal. Judgment and thought content normal.  Nursing note  and vitals reviewed.  ED Results / Procedures / Treatments   Labs (all labs ordered are listed, but only abnormal results are displayed) Labs Reviewed  BASIC METABOLIC PANEL - Abnormal; Notable for the following components:      Result Value   Glucose, Bld 104 (*)    Creatinine, Ser 1.08 (*)    GFR, Estimated 56 (*)    All other components within normal limits  CBC WITH DIFFERENTIAL/PLATELET    EKG None  Radiology CT ANGIO HEAD NECK W WO CM  Result Date: 05/16/2023 CLINICAL DATA:  Headache, right neck pain, right upper dental pain EXAM: CT ANGIOGRAPHY HEAD AND NECK WITH AND WITHOUT CONTRAST TECHNIQUE: Multidetector CT imaging of the head and neck was performed using the standard protocol during bolus administration of intravenous contrast. Multiplanar CT image reconstructions and MIPs were obtained to evaluate the vascular anatomy. Carotid stenosis  measurements (when applicable) are obtained utilizing NASCET criteria, using the distal internal carotid diameter as the denominator. RADIATION DOSE REDUCTION: This exam was performed according to the departmental dose-optimization program which includes automated exposure control, adjustment of the mA and/or kV according to patient size and/or use of iterative reconstruction technique. CONTRAST:  75mL OMNIPAQUE IOHEXOL 350 MG/ML SOLN COMPARISON:  Head 02/19/2018, no prior CTA available FINDINGS: CT HEAD FINDINGS Brain: No evidence of acute infarct, hemorrhage, mass, mass effect, or midline shift. No hydrocephalus or extra-axial fluid collection. Periventricular white matter changes, likely the sequela of chronic small vessel ischemic disease. CT Vascular: No hyperdense vessel. Skull: Negative for fracture or focal lesion. Sinuses/Orbits: Mucosal thickening in the right maxillary sinus. Other: The mastoid air cells are well aerated. CTA NECK FINDINGS Aortic arch: Standard branching. Imaged portion shows no evidence of aneurysm or dissection. No significant stenosis of the major arch vessel origins. Aortic atherosclerosis. Right carotid system: No evidence of dissection, occlusion, or hemodynamically significant stenosis (greater than 50%). Left carotid system: No evidence of dissection, occlusion, or hemodynamically significant stenosis (greater than 50%). Vertebral arteries: Mild stenosis in the distal left V1 and proximal left V2. No other hemodynamically significant stenosis in the vertebral arteries. No evidence of dissection. Skeleton: Degenerative changes in the cervical spine. Periapical lucency about the roots of the right first maxillary molar, with dehiscence of the floor of the right maxillary sinus (series 604, image 108); this tooth also demonstrates dental caries. Other neck: No acute finding. Upper chest: No focal pulmonary opacity or pleural effusion. Review of the MIP images confirms the above  findings CTA HEAD FINDINGS Anterior circulation: Both internal carotid arteries are patent to the termini, with calcifications but without significant stenosis. A1 segments patent. Normal anterior communicating artery. Anterior cerebral arteries are patent to their distal aspects without significant stenosis. No M1 stenosis or occlusion. MCA branches perfused to their distal aspects without significant stenosis. Posterior circulation: Vertebral arteries patent to the vertebrobasilar junction without significant stenosis. Posterior inferior cerebellar arteries patent proximally. Basilar patent to its distal aspect without significant stenosis. Superior cerebellar arteries patent proximally. Patent P1 segments. PCAs perfused to their distal aspects without significant stenosis. The bilateral posterior communicating arteries are not visualized. Venous sinuses: As permitted by contrast timing, patent. Anatomic variants: None significant. No evidence of aneurysm or vascular malformation. Review of the MIP images confirms the above findings IMPRESSION: 1. No acute intracranial process. 2. No intracranial large vessel occlusion or significant stenosis. 3. No hemodynamically significant stenosis in the neck. 4. Apical periodontitis of the roots of the right first maxillary molar, with dehiscence  of the floor of the right maxillary sinus and right maxillary mucosal thickening, consistent with odontogenic sinusitis. Electronically Signed   By: Wiliam Ke M.D.   On: 05/16/2023 23:14    Procedures Procedures    Medications Ordered in ED Medications  oxyCODONE-acetaminophen (PERCOCET/ROXICET) 5-325 MG per tablet 1 tablet (1 tablet Oral Given 05/16/23 2152)  iohexol (OMNIPAQUE) 350 MG/ML injection 75 mL (75 mLs Intravenous Contrast Given 05/16/23 2251)    ED Course/ Medical Decision Making/ A&P   67 year old with multiple medical problems here for evaluation of dental pain.  Since the pain is worse and she has  developed right sided headache as well as neck pain.  She is afebrile, nonseptic, not ill-appearing.  No recent manipulation procedures to her neck.  She has a nonfocal neuroexam without deficits.  I have low suspicion for meningitis.  She is nontender to her facial structures I low suspicion for trigeminal neuralgia, giant cell arteritis.  No eye pain to suggest acute angle glaucoma.  She does have very poor dentition with multiple teeth eroded to the gumline.  Her sublingual area soft.  Her submandibular area is without fluctuance, induration or swelling.  I have low suspicion for Ludwig's angina, deep space infection.  Given she has developed headache and neck pain as well as her history we will get basic labs as well as CT head and neck to ensure no other pathology such as dissection  Labs and imaging personally viewed and interpreted:  Labs without significant finding CT scan without large vessel occlusion, dissection.  She does have odontogenic infection on the right which is consistent with exam  I discussed results with patient.  She was started on antibiotics.  Will refer her outpatient to dentistry.  She would likely need oral surgery given her cardiac history.  The patient has been appropriately medically screened and/or stabilized in the ED. I have low suspicion for any other emergent medical condition which would require further screening, evaluation or treatment in the ED or require inpatient management.  Patient is hemodynamically stable and in no acute distress.  Patient able to ambulate in department prior to ED.  Evaluation does not show acute pathology that would require ongoing or additional emergent interventions while in the emergency department or further inpatient treatment.  I have discussed the diagnosis with the patient and answered all questions.  Pain is been managed while in the emergency department and patient has no further complaints prior to discharge.  Patient is  comfortable with plan discussed in room and is stable for discharge at this time.  I have discussed strict return precautions for returning to the emergency department.  Patient was encouraged to follow-up with PCP/specialist refer to at discharge.   Patient seen by the attending, Dr. Suezanne Jacquet who is agreeable for above treatment, plan disposition                                Medical Decision Making Amount and/or Complexity of Data Reviewed External Data Reviewed: labs, radiology and notes. Labs: ordered. Decision-making details documented in ED Course. Radiology: ordered and independent interpretation performed. Decision-making details documented in ED Course.  Risk OTC drugs. Prescription drug management. Decision regarding hospitalization. Diagnosis or treatment significantly limited by social determinants of health.          Final Clinical Impression(s) / ED Diagnoses Final diagnoses:  Dental infection    Rx / DC Orders ED Discharge Orders  Ordered    oxyCODONE-acetaminophen (PERCOCET/ROXICET) 5-325 MG tablet  Every 6 hours PRN        05/16/23 2322    amoxicillin (AMOXIL) 500 MG capsule  3 times daily        05/16/23 2322              Criselda Starke A, PA-C 05/16/23 2329    Lonell Grandchild, MD 05/17/23 1510

## 2023-05-16 NOTE — ED Triage Notes (Signed)
Pt c/o dental pain (RT upper) since Fri

## 2023-05-17 ENCOUNTER — Telehealth: Payer: Self-pay | Admitting: Cardiology

## 2023-05-17 ENCOUNTER — Telehealth: Payer: Self-pay | Admitting: *Deleted

## 2023-05-17 ENCOUNTER — Other Ambulatory Visit: Payer: Self-pay | Admitting: Cardiology

## 2023-05-17 ENCOUNTER — Other Ambulatory Visit: Payer: Self-pay | Admitting: Physical Medicine and Rehabilitation

## 2023-05-17 DIAGNOSIS — I10 Essential (primary) hypertension: Secondary | ICD-10-CM

## 2023-05-17 MED ORDER — OXYCODONE HCL 5 MG PO TABS: ORAL_TABLET | ORAL | 0 refills | Status: DC

## 2023-05-17 MED ORDER — OXYCODONE HCL 5 MG PO TABS: 5.0000 mg | ORAL_TABLET | ORAL | 0 refills | Status: DC | PRN

## 2023-05-17 NOTE — Telephone Encounter (Signed)
PMP was Reviewed.  Dr Marijean Niemann note was reviewed.  Oxycodone e-scribed to the [facility] as patient requested and prescription failed twice.

## 2023-05-17 NOTE — Telephone Encounter (Signed)
Spoke with patient and she states she would like to know if she can really cut her crestor in half/  She also states pharmd advised to start losartan. She states she has not started losartan because she didn't have any. She started taking her lorsartan htz. She also has not been taking he farxiga.   Sh would like a call from pharmD.  I did inform patient losartan has been sent to the pharmacy.

## 2023-05-17 NOTE — Telephone Encounter (Signed)
Our Rx kept failing to go to the [facility] in [place]. I spoke with the patient and she insists she always gets her meds filled [facility]. [facility]. I have called Madison West back and told her she needs to take the percocet prescribed at the ED for her tooth abscess, be seen and treated by the dentist/oral surgeon, and then call us back and we can send in the Rx for the oxy IR when she has completed treatment.

## 2023-05-17 NOTE — Telephone Encounter (Signed)
I spoke with Madison West to let her know medication has been sent to her pharmacy.She asked if it was sent to the [facility] pharmacy. It was not. She says she cannot afford it at the [facility] and wants it sent to the [facility] pharmacy. I have verified she did not receive either of the Rx sent in by Dr Carlis Abbott. She is asking that the plain oxycodone without the tylenol be sent to the mail order pharmacy. I will cancel the orders at [facility] and ask that our NP address this as Dr Carlis Abbott is on vacation this week.

## 2023-05-17 NOTE — Telephone Encounter (Signed)
Madison West called and reported that she got an Rx for Percocet 5/325 from [facility] ED on [address] because she has a tooth abscess and we cancelled both Rx. I looked at the Rx and had mistakenly thought Dr Carlis Abbott wrote both of these Rx. I called [facility] and got the #15 Percocet reinstated since it was not our Rx.

## 2023-05-17 NOTE — Telephone Encounter (Signed)
Sorry to hear that. Please do se ethe dentist if pain is not getting better. Ideally, I would not recommend holding blood thinners, if dentist can perform their procedure on it. This is due to the recency of stent placement.   Thanks MJP

## 2023-05-17 NOTE — Telephone Encounter (Signed)
-----   Message from Horton Chin sent at 05/14/2023 12:55 PM EST ----- Regarding: RE: Ok please let her know I have sent her oxycodone for her! ----- Message ----- From: Doreene Eland, RN Sent: 05/13/2023   3:26 PM EST To: Horton Chin, MD Subject: RE:                                            She only had Ethyl Glucuronide .  "EtG is a metabolite of ethyl alcohol that may also be a fermentation     product of glucose.  Incidental exposure to alcohol may also result     in detectable levels of EtG.  The absence of EtS, another ethanol     metabolite, makes the presence of EtG due to incidental exposure or     fermentation of glucose more likely.  EtG/EtS results should be     interpreted in the context of all available clinical and behavioral     information." She does have elevated blood sugars. ----- Message ----- From: Horton Chin, MD Sent: 05/13/2023   8:20 AM EST To: Doreene Eland, RN Subject: FW:                                            Nasean Zapf, please let patient know due to presence of alcohol in her urine sample we cannot prescribe opioids at this time, if she can abstain from alcohol we can retest and prescribe oxycodone if there is no alcohol present ----- Message ----- From: Interface, [facility] Lab Results In Sent: 05/13/2023   7:39 AM EST To: Horton Chin, MD

## 2023-05-17 NOTE — Telephone Encounter (Signed)
Pt c/o medication issue:  1. Name of Medication:  losartan (COZAAR) 25 MG tablet  2. How are you currently taking this medication (dosage and times per day)?   3. Are you having a reaction (difficulty breathing--STAT)?   4. What is your medication issue?   Patient states when she was seen by pharmacist she was advised to cut Crestor in half but she hasn't done so because she says that Crestor is  not supposed to be cut in half and she is afraid to do so. Patient also mentions that she has been put on Losartan hydrochlorothiazide and she has concerns.

## 2023-05-18 NOTE — Telephone Encounter (Signed)
Spoke with patient. She picked up the plain losartan yesterday and will start taking 1/2 tablet. Advised to resume [place] 10mg  daily in a few days. Ok to cut rosuvastatin. She feels better since not taking.  Needs to find sedation dentist. Has a tooth infection. On abx.

## 2023-05-26 ENCOUNTER — Other Ambulatory Visit: Payer: Self-pay | Admitting: Physical Medicine and Rehabilitation

## 2023-05-27 MED ORDER — METHOCARBAMOL 500 MG PO TABS: 500.0000 mg | ORAL_TABLET | Freq: Two times a day (BID) | ORAL | 0 refills | Status: DC

## 2023-05-27 MED ORDER — GABAPENTIN 300 MG PO CAPS: 300.0000 mg | ORAL_CAPSULE | Freq: Three times a day (TID) | ORAL | 0 refills | Status: DC

## 2023-05-28 ENCOUNTER — Telehealth: Payer: Self-pay | Admitting: Physical Medicine and Rehabilitation

## 2023-05-28 NOTE — Telephone Encounter (Signed)
Patient called in and states she is going to hold off on scheduling anything with oral surgeon for now and wanted to let us know , asking for a follow up to discuss about pain medication

## 2023-05-28 NOTE — Telephone Encounter (Signed)
Patient said she does not want to follow up with oral surgeon because it is not affordable, and she will like to discuss options with you if she can

## 2023-06-02 ENCOUNTER — Institutional Professional Consult (permissible substitution) (HOSPITAL_BASED_OUTPATIENT_CLINIC_OR_DEPARTMENT_OTHER): Payer: Medicare Other | Admitting: Pulmonary Disease

## 2023-06-03 ENCOUNTER — Encounter: Payer: Self-pay | Admitting: Cardiology

## 2023-06-03 ENCOUNTER — Ambulatory Visit: Payer: Medicare Other | Attending: Cardiology | Admitting: Cardiology

## 2023-06-03 VITALS — BP 132/76 | HR 81 | Ht 68.0 in | Wt 203.0 lb

## 2023-06-03 DIAGNOSIS — R042 Hemoptysis: Secondary | ICD-10-CM | POA: Diagnosis present

## 2023-06-03 DIAGNOSIS — I5020 Unspecified systolic (congestive) heart failure: Secondary | ICD-10-CM | POA: Insufficient documentation

## 2023-06-03 DIAGNOSIS — I2510 Atherosclerotic heart disease of native coronary artery without angina pectoris: Secondary | ICD-10-CM | POA: Insufficient documentation

## 2023-06-03 DIAGNOSIS — E782 Mixed hyperlipidemia: Secondary | ICD-10-CM | POA: Diagnosis present

## 2023-06-03 NOTE — Progress Notes (Signed)
Cardiology Office Note:  .   Date:  06/03/2023  ID:  Madison West, DOB [birth year], MRN [id number] PCP: April Manson, NP   [facility] Providers Cardiologist:  Truett Mainland, MD PCP: April Manson, NP  Chief Complaint  Patient presents with   Coronary Artery Disease      History of Present Illness: .    Madison West is a 67 y.o. female with hyperlipidemia, CAD, prolonged hospital stay in 01/2023 with VF arrest due to acute stent thrombosis, s/p TAVR for severe AS with now recovering EF, h/o right carotid endarterectomy, h/o breast cancer, h/o provoked DVT in Lt internal jugular along with acute superficial vein thrombosis involving the left cephalic vein, proximal upper arm to mid forearm.   Patient has recently had issues with dental pain, but denies any cardiac symptoms or complaints.  It appears that she could not tolerate Entresto due to low blood pressures, and is on losartan.  Contrary to my initial impression, patient has not been on Brilinta heparin since her hospital discharge in 02/2023.  She has been on Plavix, along with Eliquis for DVT, recommended for 3 to 6 months since diagnosis in 01/2023.  Dizziness has improved.   On a separate note, patient still continues to report blood-tinged sputum for several months.  Vitals:   06/03/23 1207  BP: 132/76  Pulse: 81  SpO2: 92%     ROS:  Review of Systems  Cardiovascular:  Negative for chest pain, dyspnea on exertion, leg swelling, palpitations and syncope.     Studies Reviewed: Marland Kitchen        Independently interpreted 04/2023: Hb 13.5 Cr 1.08  12/2022: Chol 220, TG 126, HDL 51, LDL 146    Physical Exam:   Physical Exam Vitals and nursing note reviewed.  Constitutional:      General: She is not in acute distress. Neck:     Vascular: No JVD.  Cardiovascular:     Rate and Rhythm: Normal rate and regular rhythm.     Heart sounds: Normal heart sounds. No murmur heard. Pulmonary:      Effort: Pulmonary effort is normal.     Breath sounds: Normal breath sounds. No wheezing or rales.  Musculoskeletal:     Right lower leg: No edema.     Left lower leg: No edema.      VISIT DIAGNOSES:   ICD-10-CM   1. HFrEF (heart failure with reduced ejection fraction) (HCC)  I50.20 ECHOCARDIOGRAM COMPLETE    Basic metabolic panel    Pro b natriuretic peptide (BNP)    Pro b natriuretic peptide (BNP)    Basic metabolic panel    2. Hemoptysis  R04.2 Ambulatory referral to Pulmonology    3. Coronary artery disease involving native coronary artery of native heart without angina pectoris  I25.10     4. Mixed hyperlipidemia  E78.2        ASSESSMENT AND PLAN: .    Madison West is a 67 y.o. female with hyperlipidemia, CAD, prolonged hospital stay in 01/2023 with VF arrest due to acute stent thrombosis, s/p TAVR for severe AS with now recovering EF, h/o right carotid endarterectomy, h/o breast cancer, h/o provoked DVT in Lt internal jugular along with acute superficial vein thrombosis involving the left cephalic vein, proximal upper arm to mid forearm.    CAD: PCI to LAD, complicated by stent thrombosis while on Plavix in the setting of severe AS and HFrEF, leading to VF arrest, treated with thrombectomy and  balloon dilatation with excellent results. No anginal symptoms at this time. Given h/o stent thrombosis, I would recommend P2 Y12 therapy lifelong.   Contrary to my initial understanding, it appears that she has not been on Brilinta, but has been on Plavix since her discharge in 02/2023. Given that she has done well on Plavix for last 4 months, would continue the same. LDL 146, lipoprotein a 227 on Crestor 20 mg daily. I had referred her to lipid clinic for consideration for Leqvio, but patient is not ready for this yet.   HFrEF: In the setting of severe obstructive disease in LAD and severe AS, both of which have been addressed with PCI and TAVR respectively. EF recovered to  40-45% and spite of minimal tolerance to GDMT. She has not tolerated most GDMT, and is only on losartan 12.5 mg daily.  Check BMP in 1 week. Given improvement in blood pressures, will increase losartan to 25 mg daily. Will repeat echocardiogram in next few weeks, anticipate to see further improvement in EF.   Hemoptysis: Mild amount.  Will refer to pulmonology.  H/o VF arrest: Occurred in the setting of acute stent thrombosis in 01/2023. It is reasonable to continue amiodarone 200 mg daily for now. I do suspect we may be able to get her off amiodarone eventually, would aim for 07/2023 which would be 6 months since her VF arrest. In the meantime, if her LFTs or TSH are out of order, I believe amiodarone can be discontinued before then.   H/o DVT: Upper arm DVT diagnosed 01/2023.  On Eliquis since then. Reasonable to discontinue after completing 6 months of therapy. Will likely discontinue Eliquis at her next office visit.      F/u in 3 months  Signed, Elder Negus, MD

## 2023-06-03 NOTE — Patient Instructions (Addendum)
Medication Instructions:   Your physician recommends that you continue on your current medications as directed. Please refer to the Current Medication list given to you today.  *If you need a refill on your cardiac medications before your next appointment, please call your pharmacy*   You have been referred to [facility]--THEIR OFFICE WILL CALL YOU TO ARRANGE THIS APPOINTMENT   Lab Work:  IN ONE WEEK --BMET AND PRO-BNP HERE IN THIS BUILDING FIRST FLOOR (NEXT THURSDAY 06/10/23)  If you have labs (blood work) drawn today and your tests are completely normal, you will receive your results only by: MyChart Message (if you have MyChart) OR A paper copy in the mail If you have any lab test that is abnormal or we need to change your treatment, we will call you to review the results.   Testing/Procedures:  Your physician has requested that you have an echocardiogram. Echocardiography is a painless test that uses sound waves to create images of your heart. It provides your doctor with information about the size and shape of your heart and how well your heart's chambers and valves are working. This procedure takes approximately one hour. There are no restrictions for this procedure.  SCHEDULE IN 3-4 WEEKS   Please do NOT wear cologne, perfume, aftershave, or lotions (deodorant is allowed). Please arrive 15 minutes prior to your appointment time.  Please note: We ask at that you not bring children with you during ultrasound (echo/ vascular) testing. Due to room size and safety concerns, children are not allowed in the ultrasound rooms during exams. Our front office staff cannot provide observation of children in our lobby area while testing is being conducted. An adult accompanying a patient to their appointment will only be allowed in the ultrasound room at the discretion of the ultrasound technician under special circumstances. We apologize for any inconvenience.    Follow-Up:  1.)   ONE MONTH WITH OUR PHARMACIST HERE IN THE OFFICE (PATIENT ALREADY ESTABLISHED)  2.)  3 MONTHS WITH DR. PATWARDHAN

## 2023-06-04 ENCOUNTER — Other Ambulatory Visit: Payer: Self-pay

## 2023-06-04 ENCOUNTER — Encounter: Payer: Self-pay | Admitting: Cardiology

## 2023-06-04 ENCOUNTER — Other Ambulatory Visit: Payer: Self-pay | Admitting: Physical Medicine and Rehabilitation

## 2023-06-04 ENCOUNTER — Telehealth: Payer: Self-pay | Admitting: Cardiology

## 2023-06-04 ENCOUNTER — Telehealth: Payer: Self-pay | Admitting: Physical Medicine and Rehabilitation

## 2023-06-04 DIAGNOSIS — R042 Hemoptysis: Secondary | ICD-10-CM | POA: Insufficient documentation

## 2023-06-04 MED ORDER — BUSPIRONE HCL 10 MG PO TABS: 10.0000 mg | ORAL_TABLET | Freq: Two times a day (BID) | ORAL | 0 refills | Status: DC

## 2023-06-04 MED ORDER — METHOCARBAMOL 500 MG PO TABS: 500.0000 mg | ORAL_TABLET | Freq: Two times a day (BID) | ORAL | 0 refills | Status: AC

## 2023-06-04 MED ORDER — APIXABAN 5 MG PO TABS: 5.0000 mg | ORAL_TABLET | Freq: Two times a day (BID) | ORAL | 0 refills | Status: DC

## 2023-06-04 MED ORDER — GABAPENTIN 300 MG PO CAPS: 300.0000 mg | ORAL_CAPSULE | Freq: Three times a day (TID) | ORAL | 0 refills | Status: DC

## 2023-06-04 NOTE — Telephone Encounter (Signed)
Continuation from previous note:  Call disconnected abruptly.  Patient wants to prescription sent to her local pharmacy to get some medication until her [facility] prescription comes in.

## 2023-06-04 NOTE — Telephone Encounter (Signed)
*  STAT* If patient is at the pharmacy, call can be transferred to refill team.   1. Which medications need to be refilled? (please list name of each medication and dose if known)  apixaban (ELIQUIS) 5 MG TABS tablet   2. Would you like to learn more about the convenience, safety, & potential cost savings by using the [facility]?   3. Are you open to using the [facility] (Type [facility]. ).  4. Which pharmacy/location (including street and city if local pharmacy) is medication to be sent to?  [facility] - [place], [place] - [address]   5. Do they need a 30 day or 90 day supply?   90 day  Patient stated she is out of this medication and will need medication until this prescription comes in.

## 2023-06-04 NOTE — Telephone Encounter (Signed)
Patient states she is out of her Eliquis, she took her last pill this morning. She is going to call [facility] to order a refill but will not receive it for another 7 days.  Offered patient 1 week of samples, but she states it is out of the way for her to come to the office to pick-up. [facility] [place]. Sent in refill for 14 tablets to get her through until she receives her regular Rx through the mail.  Patient expressed appreciation for assistance, no further needs voiced at this time.

## 2023-06-04 NOTE — Telephone Encounter (Signed)
Called in states she didn't notice she was completely out of medication , and [facility] pharmacy takes 7-10 days to deliver and wants to know if she could get a small amount sent to [facility] [place] ,Patient needs Buspar , gabapentin and Robaxin , she is worried if she does not take medication if it will affect her body

## 2023-06-08 ENCOUNTER — Encounter: Payer: Medicare Other | Admitting: Physical Medicine and Rehabilitation

## 2023-06-09 ENCOUNTER — Encounter
Payer: Medicare Other | Attending: Physical Medicine and Rehabilitation | Admitting: Physical Medicine and Rehabilitation

## 2023-06-09 DIAGNOSIS — R042 Hemoptysis: Secondary | ICD-10-CM | POA: Diagnosis not present

## 2023-06-09 DIAGNOSIS — M545 Low back pain, unspecified: Secondary | ICD-10-CM | POA: Insufficient documentation

## 2023-06-09 DIAGNOSIS — G8929 Other chronic pain: Secondary | ICD-10-CM | POA: Insufficient documentation

## 2023-06-09 MED ORDER — OXYCODONE HCL 5 MG PO TABS: 5.0000 mg | ORAL_TABLET | Freq: Every day | ORAL | 0 refills | Status: DC | PRN

## 2023-06-10 ENCOUNTER — Other Ambulatory Visit (HOSPITAL_COMMUNITY): Payer: Medicare Other

## 2023-06-10 NOTE — Progress Notes (Signed)
Subjective:    Patient ID: Madison West, female    DOB: 1956-04-01, 67 y.o.   MRN: [id number]  HPI Madison West is a 67 year old woman who presents for hospital follow-up of debility.   1) Debility: -doing well walking -she has returned to bar tending -she also primarily takes care of her house  2) Cough/hemoptysis: -had CXR done but as not gotten results yet -she has not yet heard from pulmonology -continues to a mild degree  3) History of chest pain/CAD: -discussed her prior symptoms and experience with stent placement  4) Cervical spine pain: -discussed that she got an XR but has not heard the results yet  5) Muscle weakness: -she feels she has in response to statin  Pain Inventory Average Pain 4 Pain Right Now 4 My pain is aching  In the last 24 hours, has pain interfered with the following? General activity 6 Relation with others 1 Enjoyment of life 5 What TIME of day is your pain at its worst? morning  and night Sleep (in general) Good  Pain is worse with: walking, bending, and standing Pain improves with: rest and medication Relief from Meds: 8  walk without assistance how many minutes can you walk? ? ability to climb steps?  yes do you drive?  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  anxiety  Hospital f/u  Hospital f/u    Family History  Problem Relation Age of Onset   Colon cancer Mother 66   Heart disease Father        heart attack   Colon polyps Brother        one brother had large colon polyp that needed surgery to be removed   Heart disease Paternal Grandfather    Diabetes type II Brother    Social History   Socioeconomic History   Marital status: Widowed    Spouse name: Not on file   Number of children: 2   Years of education: Not on file   Highest education level: Not on file  Occupational History   Occupation: server    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  Tobacco Use   Smoking status: Former     Current packs/day: 0.00    Average packs/day: 0.8 packs/day for 20.0 years (15.0 ttl pk-yrs)    Types: Cigarettes    Start date: 05/06/1977    Quit date: 05/06/1997    Years since quitting: 26.1   Smokeless tobacco: Never  Vaping Use   Vaping status: Never Used  Substance and Sexual Activity   Alcohol use: Not Currently    Comment: has't had a drink in 9 weeks   Drug use: No   Sexual activity: Not Currently  Other Topics Concern   Not on file  Social History Narrative   Not on file   Social Drivers of Health   Financial Resource Strain: Low Risk  (03/10/2023)   [facility]   Overall Financial Resource Strain (CARDIA)    Difficulty of Paying Living Expenses: Not hard at all  Food Insecurity: No Food Insecurity (03/10/2023)   [facility]   Hunger Vital Sign    Worried About Running Out of Food in the Last Year: Never true    Ran Out of Food in the Last Year: Never true  Transportation Needs: No Transportation Needs (03/10/2023)   Received from [facility] - Transportation    Lack of Transportation (Medical): No  Lack of Transportation (Non-Medical): No  Physical Activity: Not on file  Stress: Not on file  Social Connections: Unknown (03/05/2023)   Received from [facility]   Social Network    Social Network: Not on file   Past Surgical History:  Procedure Laterality Date   APPENDECTOMY     age 64   CESAREAN SECTION     x 2   COLONOSCOPY W/ POLYPECTOMY     COLONOSCOPY WITH PROPOFOL N/A 04/27/2016   Procedure: COLONOSCOPY WITH PROPOFOL;  Surgeon: Charolett Bumpers, MD;  Location: [facility] END[facility];  Service: Endoscopy;  Laterality: N/A;   CORONARY STENT INTERVENTION N/A 01/29/2023   Procedure: CORONARY STENT INTERVENTION;  Surgeon: Elder Negus, MD;  Location: [facility] INVASIVE CV LAB;  Service: Cardiovascular;  Laterality: N/A;   CORONARY THROMBECTOMY N/A 01/31/2023   Procedure: Coronary Thrombectomy;  Surgeon: Elder Negus, MD;  Location: [facility];  Service: Cardiovascular;  Laterality: N/A;   CORONARY ULTRASOUND/IVUS N/A 01/31/2023   Procedure: Coronary Ultrasound/IVUS;  Surgeon: Elder Negus, MD;  Location: [facility];  Service: Cardiovascular;  Laterality: N/A;   CORONARY/GRAFT ACUTE MI REVASCULARIZATION N/A 01/31/2023   Procedure: Coronary/Graft Acute MI Revascularization;  Surgeon: Elder Negus, MD;  Location: [facility];  Service: Cardiovascular;  Laterality: N/A;   DILATION AND CURETTAGE OF UTERUS     multiple   ENDARTERECTOMY Right 06/09/2019   ENDARTERECTOMY Right 06/09/2019   Procedure: ENDARTERECTOMY CAROTID RIGHT;  Surgeon: Sherren Kerns, MD;  Location: [facility] OR;  Service: Vascular;  Laterality: Right;   IABP INSERTION N/A 02/02/2023   Procedure: IABP Insertion;  Surgeon: Elder Negus, MD;  [facility]: [facility];  Service: Cardiovascular;  Laterality: N/A;   INTRAUTERINE DEVICE INSERTION     IUD REMOVAL     LEFT HEART CATH AND CORONARY ANGIOGRAPHY N/A 02/27/2020   Procedure: LEFT HEART CATH AND CORONARY ANGIOGRAPHY;  Surgeon: Yates Decamp, MD;  [facility]: [facility];  Service: Cardiovascular;  Laterality: N/A;   LEFT HEART CATH AND CORONARY ANGIOGRAPHY N/A 01/31/2023   Procedure: LEFT HEART CATH AND CORONARY ANGIOGRAPHY;  Surgeon: Elder Negus, MD;  [facility]: [facility];  Service: Cardiovascular;  Laterality: N/A;   MASTECTOMY PARTIAL / LUMPECTOMY W/ AXILLARY LYMPHADENECTOMY Right    lumpectomy and lymph nodes removed   PATCH ANGIOPLASTY Right 06/09/2019   Procedure: Patch Angioplasty of right carotid artery using hemashield paltinum finesse patch;  Surgeon: Sherren Kerns, MD;  Location: [facility] OR;  Service: Vascular;  Laterality: Right;   RIGHT AND LEFT HEART CATH N/A 02/01/2023   Procedure: RIGHT AND LEFT HEART CATH;  Surgeon: Elder Negus, MD;  [facility];  Service: Cardiovascular;  Laterality: N/A;    RIGHT HEART CATH N/A 02/02/2023   Procedure: RIGHT HEART CATH;  Surgeon: Dolores Patty, MD;  [facility]: [facility];  Service: Cardiovascular;  Laterality: N/A;   RIGHT/LEFT HEART CATH AND CORONARY ANGIOGRAPHY N/A 01/27/2023   Procedure: RIGHT/LEFT HEART CATH AND CORONARY ANGIOGRAPHY;  Surgeon: Elder Negus, MD;  [facility];  Service: Cardiovascular;  Laterality: N/A;   TEE WITHOUT CARDIOVERSION N/A 02/04/2023   Procedure: TRANSESOPHAGEAL ECHOCARDIOGRAM;  Surgeon: Orbie Pyo, MD;  Location: [facility] INVASIVE CV LAB;  Service: Open Heart Surgery;  Laterality: N/A;   TEMPORARY PACEMAKER N/A 02/02/2023   Procedure: TEMPORARY PACEMAKER;  Surgeon: Dolores Patty, MD;  [facility];  Service:  Cardiovascular;  Laterality: N/A;   TRANSCATHETER AORTIC VALVE REPLACEMENT, TRANSFEMORAL N/A 02/04/2023   Procedure: Transcatheter Aortic Valve Replacement, Transfemoral;  Surgeon: Orbie Pyo, MD;  Location: [facility];  Service: Open Heart Surgery;  Laterality: N/A;   TUBAL LIGATION     VULVA SURGERY     Multiple times for dysplasia   Past Medical History:  Diagnosis Date   Anxiety    Arthritis    low back and hip pain intermittent   Breast cancer (HCC) 06/02/07   r breast -surgery ,radiaology. chemotherapy   Carpal tunnel syndrome    right hand   Colon polyps    Complication of anesthesia    Fentanyl, Versed-makes extra hyper, bradycardia x 1 in PACU, [facility] (08/15/11 cardiology felt neostigmine may have resulted in AV nodal block)    Coronary artery disease    Depression    denies   Dysplasia of vulva    Hypertension    Palpitations    PSVT, s/p adenosine 08/04/16   S/P breast lumpectomy 07/04/07   R breast   S/P radiation therapy 2009   There were no vitals taken for this visit.  Opioid Risk Score:   Fall Risk Score:  `1  Depression screen PHQ 2/9     05/10/2023   11:59 AM 01/19/2020    1:33 PM 10/04/2019    2:40 PM  07/03/2019   10:46 AM 02/07/2019    2:16 PM 05/24/2018    2:52 PM 10/15/2017    1:36 PM  Depression screen PHQ 2/9  Decreased Interest 0 0 0 1 0 0 0  Down, Depressed, Hopeless 0 0 0 1 0 0 0  PHQ - 2 Score 0 0 0 2 0 0 0  Altered sleeping 0   0     Tired, decreased energy 2   1     Change in appetite 0   0     Feeling bad or failure about yourself  0   0     Trouble concentrating 0   0     Moving slowly or fidgety/restless 0   0     Suicidal thoughts 0   0     PHQ-9 Score 2   3     Difficult doing work/chores Somewhat difficult           Review of Systems  Respiratory:  Positive for cough and wheezing.   Musculoskeletal:  Positive for back pain.  Psychiatric/Behavioral:  The patient is nervous/anxious.   All other systems reviewed and are negative.      Objective:   Physical Exam   Gen: no distress, normal appearing HEENT: oral mucosa pink and moist, NCAT Cardio: Reg rate Chest: normal effort, normal rate of breathing Abd: soft, non-distended Ext: no edema Psych: pleasant, normal affect Skin: intact Neuro: Alert and oriented x3 MSK: 5/5 strength throughout, TTP lower back, limited lumbar spine ROM     Assessment & Plan:   1) Debility: -discussed that she is thankful for her recovery -discussed that she has returned to work -discussed that se has established care with a new PCP  2) Chronic cough/hemoptysis: -discussed that she followed up with her PCP -discussed that she had a CXR  -replaced pulmonology referral -discussed potential etiologies  3) Cervical spine pain -discussed that flexed posture can contribute greatly - 4) Muscle weakness: -discussed that she thinks this is 2/2 to her statin -recommended Coenzyme Q10, discussed that she used to take this in the  past -continue Crestor -discussed that she has aching in her legs and feels she has been run over by a truck  5) History of chest pain/CAD: -discussed her prior symptoms and stent placement  6)  Desaturation: -desatting to 87% -discussed that she feels well -referred to pulmonology   7) Chronic low back pain -UDS and pain contract performed  -discussed that she may need oxycodone 2-3 x/week 5mg  oxycodone, prescribed, advised to abstain from alcohol   14 minutes spent in discussion of continued hemoptysis, provided new referral to pulmonologist in [place], discussed potential etiologies, discussed that UDS contains alcohol and we do not permit alcohol use with opioid use, discussed that patient rarely drinks alcohol and plans to abstain

## 2023-06-21 ENCOUNTER — Ambulatory Visit: Payer: Medicare Other | Attending: Cardiovascular Disease | Admitting: Student

## 2023-06-21 ENCOUNTER — Encounter: Payer: Self-pay | Admitting: Student

## 2023-06-21 DIAGNOSIS — E782 Mixed hyperlipidemia: Secondary | ICD-10-CM

## 2023-06-21 DIAGNOSIS — E7800 Pure hypercholesterolemia, unspecified: Secondary | ICD-10-CM

## 2023-06-21 MED ORDER — EZETIMIBE 10 MG PO TABS: 10.0000 mg | ORAL_TABLET | Freq: Every day | ORAL | 3 refills | Status: DC

## 2023-06-21 NOTE — Assessment & Plan Note (Addendum)
Assessment:  LDL goal: < 70 mg/dl last LDLc  782  mg/dl before starting Crestor Risk factors includes hyperlipidemia, CAD, CHF, hx of STEMI, DVT, coronary stent trombosis, aortic valve stenosis, bilateral carotid artery stenosis, elevated Lp(a) 227 (01/27/2023) Intolerance to Crestor 20 mg daily dose  Able to tolerate Crestor 10 mg daily dose  Discussed next potential options (ezetimibe PCSK-9 inhibitors, bempedoic acid and inclisiran); cost, dosing efficacy, side effects  Patient is afraid of needle so will try Zetia and she has [employer] which will not cover PCSK9i without trying and failing Zetia on max tolerated statins  Reiterated importance of healthy diet and regular exercise  No updated lipid lab since she started Crestor will get updated lipid lab today - patient reports she only had coffee with cream since morning   Plan: Continue taking current medications (Crestor 10 mg daily)  Start taking Zetia 10 mg daily if updated LDLc level warrants will initiate PA for PCSK9i

## 2023-06-21 NOTE — Patient Instructions (Addendum)
Will get updated lipid lab today. Start taking ezetimibe 10 mg daily and continue taking rosuvastatin 10 mg daily.   Other medications that we discussed today are    Praluent is a cholesterol medication that improved your body's ability to get rid of "bad cholesterol" known as LDL. It can lower your LDL up to 60%. It is an injection that is given under the skin every 2 weeks. The most common side effects of Praluent include runny nose, symptoms of the common cold, rarely flu or flu-like symptoms, back/muscle pain in about 3-4% of the patients, and redness, pain, or bruising at the injection site.    Repatha is a cholesterol medication that improved your body's ability to get rid of "bad cholesterol" known as LDL. It can lower your LDL up to 60%! It is an injection that is given under the skin every 2 weeks. The medication often requires a prior authorization from your insurance company. The most common side effects of Repatha include runny nose, symptoms of the common cold, rarely flu or flu-like symptoms, back/muscle pain in about 3-4% of the patients, and redness, pain, or bruising at the injection site.

## 2023-06-21 NOTE — Progress Notes (Signed)
Patient ID: Madison West                 DOB: 12/11/1955                    MRN: [id number]      HPI: Madison West is a 67 y.o. female patient referred to lipid clinic by Dr.Patwardhan. PMH is significant for hyperlipidemia, CAD, CHF, hx of STEMI, DVT, coronary stent trombosis, aortic valve stenosis, s/p TVAR,bilateral carotid artery stenosis  Patient presented today for lipid clinic. Reports she could not tolerate Crestor 20 mg due to severe myalgia so she  was advised to take half of the 20 mg and she has been taking 10 mg daily. She is able to tolerate it well. Her diet is not great reports lately  she has been eating fried chicken a lot as it is easily accessible at her new work place but she also eats lot more salads. Due to hip and joint pain she is unable to exercise but she will be joining cardiac rehab starting from Jan, 2025. She never had  f/u lab since she started Crestor so will get updated lab. Reports she is not breakfast person so today she only had coffee with little bit of cream in it so far.  Reviewed options for lowering LDL cholesterol, including ezetimibe, PCSK-9 inhibitors, bempedoic acid and inclisiran.  Discussed mechanisms of action, dosing, side effects and potential decreases in LDL cholesterol.  Also reviewed cost information and potential options for patient assistance.   She is afraid of needles and she has [employer] which does not cover PCSK9i agent with out trying an failing of Zetia along with max tolerated statin.   Current Medications: Crestor 10 mg daily  Intolerances: Crestor 20 mg daily - muscle cramps  Risk Factors: hyperlipidemia, CAD, CHF, hx of STEMI, DVT, coronary stent trombosis, aortic valve stenosis, bilateral carotid artery stenosis, elevated Lp(a) 227 (01/27/2023) LDL goal: < 70 mg/dl   Diet: lately eating lot more fried chicken. Also eats lots of salads and sea food   Exercise: none  Will be joining  cardiac rehab   Family  History:  Relation Problem Comments  Mother (Deceased at age 27) Colon cancer (Age: 40)     Father (Deceased at age 46) Heart disease heart attack    Sister (Deceased at age 67)   Brother Metallurgist) Colon polyps one brother had large colon polyp that needed surgery to be removed    Brother Metallurgist) Diabetes type II     Social History:  Alcohol: 1 drink 3 times per week Smoking: quit 3 year ago  Labs: Lipid Panel     Component Value Date/Time   CHOL 220 (H) 12/30/2022 1604   TRIG 91 02/04/2023 0207   HDL 51 12/30/2022 1604   CHOLHDL 4.2 01/19/2020 1434   CHOLHDL 6.7 12/17/2011 0949   VLDL 48 (H) 12/17/2011 0949   LDLCALC 146 (H) 12/30/2022 1604   LABVLDL 23 12/30/2022 1604    Past Medical History:  Diagnosis Date   Anxiety    Arthritis    low back and hip pain intermittent   Breast cancer (HCC) 06/02/07   r breast -surgery ,radiaology. chemotherapy   Carpal tunnel syndrome    right hand   Colon polyps    Complication of anesthesia    Fentanyl, Versed-makes extra hyper, bradycardia x 1 in PACU, [facility] (08/15/11 cardiology felt neostigmine may have resulted in AV nodal block)  Coronary artery disease    Depression    denies   Dysplasia of vulva    Hypertension    Palpitations    PSVT, s/p adenosine 08/04/16   S/P breast lumpectomy 07/04/07   R breast   S/P radiation therapy 2009    Current Outpatient Medications on File Prior to Visit  Medication Sig Dispense Refill   acetaminophen (TYLENOL) 325 MG tablet Take 2 tablets (650 mg total) by mouth every 6 (six) hours. (Patient taking differently: Take 500 mg by mouth every 6 (six) hours.)     alprazolam (XANAX) 2 MG tablet Take 0.25-2 mg by mouth See admin instructions. 1 mg (may take up to 2 mg) twice daily. May also take 0.25 mg during the day if needed for anxiety.     amiodarone (PACERONE) 200 MG tablet Take 1 tablet (200 mg total) by mouth daily. 30 tablet 0   apixaban (ELIQUIS) 5 MG TABS tablet Take 1 tablet (5 mg  total) by mouth 2 (two) times daily. 14 tablet 0   busPIRone (BUSPAR) 10 MG tablet Take 1 tablet (10 mg total) by mouth 2 (two) times daily. 60 tablet 0   clopidogrel (PLAVIX) 75 MG tablet Take 1 tablet (75 mg total) by mouth daily. 90 tablet 3   dapagliflozin propanediol (FARXIGA) 10 MG TABS tablet Take 1 tablet (10 mg total) by mouth daily before breakfast. (Patient not taking: Reported on 06/03/2023) 90 tablet 3   furosemide (LASIX) 20 MG tablet Take 0.5 tablets (10 mg total) by mouth daily as needed (for lower extremity swelling and weight gain). (Patient not taking: Reported on 06/03/2023) 45 tablet 1   gabapentin (NEURONTIN) 300 MG capsule Take 1 capsule (300 mg total) by mouth 3 (three) times daily. 90 capsule 0   losartan (COZAAR) 25 MG tablet Take 0.5 tablets (12.5 mg total) by mouth daily. (FILL LOSARTAN AS PRESCRIBED AND DISCONTINUE ENTRESTO RX) 45 tablet 3   methocarbamol (ROBAXIN) 500 MG tablet Take 1 tablet (500 mg total) by mouth 2 (two) times daily. 60 tablet 0   nitroGLYCERIN (NITROSTAT) 0.4 MG SL tablet Place 1 tablet (0.4 mg total) under the tongue every 5 (five) minutes as needed for chest pain. 25 tablet 2   oxyCODONE (ROXICODONE) 5 MG immediate release tablet Take 1 tablet (5 mg total) by mouth daily as needed for severe pain (pain score 7-10). 30 tablet 0   rosuvastatin (CRESTOR) 20 MG tablet Take 0.5 tablets (10 mg total) by mouth daily.     No current facility-administered medications on file prior to visit.    Allergies  Allergen Reactions   Prednisone Palpitations   Fentanyl Swelling and Other (See Comments)    Makes pt "hyper" feels she is "climbing the walls."  Last time she had a colonoscopy with Fentanyl and Versed she was awake the whole time.   Versed [Midazolam] Swelling and Anxiety    Makes pt "hyper" feels she is "climbing the walls."  Last time she had a colonoscopy with Fentanyl and Versed she was awake the whole time.   Vicodin [Hydrocodone-Acetaminophen]  Anxiety    Extreme anxiety.  OK to take oxycodone.    Assessment/Plan:  1. Hyperlipidemia -  Problem  Pure Hypercholesterolemia   Current Medications: Crestor 10 mg daily  Intolerances: Crestor 20 mg daily - muscle cramps  Risk Factors: hyperlipidemia, CAD, CHF, hx of STEMI, DVT, coronary stent trombosis, aortic valve stenosis, bilateral carotid artery stenosis, elevated Lp(a) LDL goal: < 70 mg/dl  Pure hypercholesterolemia Assessment:  LDL goal: < 70 mg/dl last LDLc  161  mg/dl before starting Crestor Risk factors includes hyperlipidemia, CAD, CHF, hx of STEMI, DVT, coronary stent trombosis, aortic valve stenosis, bilateral carotid artery stenosis, elevated Lp(a) 227 (01/27/2023) Intolerance to Crestor 20 mg daily dose  Able to tolerate Crestor 10 mg daily dose  Discussed next potential options (ezetimibe PCSK-9 inhibitors, bempedoic acid and inclisiran); cost, dosing efficacy, side effects  Patient is afraid of needle so will try Zetia and she has [employer] which will not cover PCSK9i without trying and failing Zetia on max tolerated statins  Reiterated importance of healthy diet and regular exercise  No updated lipid lab since she started Crestor will get updated lipid lab today - patient reports she only had coffee with cream since morning   Plan: Continue taking current medications (Crestor 10 mg daily)  Start taking Zetia 10 mg daily if updated LDLc level warrants will initiate PA for PCSK9i     Thank you,  Carmela Hurt, Pharm.D [facility] A Division of [facility] [address] [place], [place] [address]  Phone: [phone]; Fax: [phone]

## 2023-06-22 LAB — LIPID PANEL
Chol/HDL Ratio: 2.7 {ratio} (ref 0.0–4.4)
Cholesterol, Total: 184 mg/dL (ref 100–199)
HDL: 67 mg/dL (ref >39)
LDL Chol Calc (NIH): 94 mg/dL (ref 0–99)
Triglycerides: 132 mg/dL (ref 0–149)
VLDL Cholesterol Cal: 23 mg/dL (ref 5–40)

## 2023-06-29 ENCOUNTER — Other Ambulatory Visit: Payer: Self-pay | Admitting: Physical Medicine and Rehabilitation

## 2023-07-05 ENCOUNTER — Encounter: Payer: Self-pay | Admitting: Physical Medicine and Rehabilitation

## 2023-07-05 ENCOUNTER — Encounter
Payer: Medicare Other | Attending: Physical Medicine and Rehabilitation | Admitting: Physical Medicine and Rehabilitation

## 2023-07-05 VITALS — BP 121/80 | HR 86 | Ht 68.0 in | Wt 202.0 lb

## 2023-07-05 DIAGNOSIS — R5381 Other malaise: Secondary | ICD-10-CM | POA: Diagnosis present

## 2023-07-05 DIAGNOSIS — R519 Headache, unspecified: Secondary | ICD-10-CM

## 2023-07-05 DIAGNOSIS — M542 Cervicalgia: Secondary | ICD-10-CM | POA: Diagnosis present

## 2023-07-05 MED ORDER — OXYCODONE HCL 5 MG PO TABS: 5.0000 mg | ORAL_TABLET | Freq: Every day | ORAL | 0 refills | Status: DC | PRN

## 2023-07-05 NOTE — Addendum Note (Signed)
 Addended by: Horton Chin on: 07/05/2023 02:36 PM   Modules accepted: Level of Service

## 2023-07-05 NOTE — Progress Notes (Addendum)
 Subjective:    Patient ID: Madison West, female    DOB: 09-01-1955, 68 y.o.   MRN: [id number]  HPI Madison West is a 68 year old woman who presents for hospital follow-up of debility.   1) Debility: -doing well walking -she has returned to bar tending -she also primarily takes care of her house -she would like to do cardiac rehab to improve her endurance  2) Cough/hemoptysis: -had CXR done but as not gotten results yet -she has not yet heard from pulmonology -continues to a mild degree  3) History of chest pain/CAD: -discussed her prior symptoms and experience with stent placement  4) Cervical spine pain: -discussed that she got an XR but has not heard the results yet -pain is worst when she cooks -she saw a spine specialist and therapy was recommended -she continues to take the muscle relaxer  5) Muscle weakness: -she feels she has in response to statin  Pain Inventory Average Pain 3 Pain Right Now 3 My pain is intermittent and hurting  In the last 24 hours, has pain interfered with the following? General activity 0 Relation with others 0 Enjoyment of life 0 What TIME of day is your pain at its worst? morning  and night Sleep (in general) Good       Family History  Problem Relation Age of Onset   Colon cancer Mother 30   Heart disease Father        heart attack   Colon polyps Brother        one brother had large colon polyp that needed surgery to be removed   Heart disease Paternal Grandfather    Diabetes type II Brother    Social History   Socioeconomic History   Marital status: Widowed    Spouse name: Not on file   Number of children: 2   Years of education: Not on file   Highest education level: Not on file  Occupational History   Occupation: server    Employer: [employer]  Tobacco Use   Smoking status: Former    Current packs/day: 0.00    Average packs/day: 0.8 packs/day for 20.0 years (15.0 ttl pk-yrs)    Types: Cigarettes     Start date: 05/06/1977    Quit date: 05/06/1997    Years since quitting: 26.1   Smokeless tobacco: Never  Vaping Use   Vaping status: Never Used  Substance and Sexual Activity   Alcohol use: Not Currently    Comment: has't had a drink in 9 weeks   Drug use: No   Sexual activity: Not Currently  Other Topics Concern   Not on file  Social History Narrative   Not on file   Social Drivers of Health   Financial Resource Strain: Low Risk  (03/10/2023)   Received from [facility]   Overall Financial Resource Strain (CARDIA)    Difficulty of Paying Living Expenses: Not hard at all  Food Insecurity: No Food Insecurity (03/10/2023)   Received from [facility]   Hunger Vital Sign    Worried About Running Out of Food in the Last Year: Never true    Ran Out of Food in the Last Year: Never true  Transportation Needs: No Transportation Needs (03/10/2023)   Received from [facility] - Transportation    Lack of Transportation (Medical): No    Lack of Transportation (Non-Medical): No  Physical Activity: Not on file  Stress: Not on file  Social Connections: Unknown (03/05/2023)  Received from [facility]   Social Network    Social Network: Not on file   Past Surgical History:  Procedure Laterality Date   APPENDECTOMY     age 89   CESAREAN SECTION     x 2   COLONOSCOPY W/ POLYPECTOMY     COLONOSCOPY WITH PROPOFOL  N/A 04/27/2016   Procedure: COLONOSCOPY WITH PROPOFOL ;  Surgeon: Gladis MARLA Louder, MD;  Location: [facility];  Service: Endoscopy;  Laterality: N/A;   CORONARY STENT INTERVENTION N/A 01/29/2023   Procedure: CORONARY STENT INTERVENTION;  Surgeon: Elmira Newman PARAS, MD;  Location: [facility] CV [facility];  Service: Cardiovascular;  Laterality: N/A;   CORONARY THROMBECTOMY N/A 01/31/2023   Procedure: Coronary Thrombectomy;  Surgeon: Elmira Newman PARAS, MD;  Location: [facility];  Service: Cardiovascular;  Laterality: N/A;   CORONARY ULTRASOUND/IVUS N/A  01/31/2023   Procedure: Coronary Ultrasound/IVUS;  Surgeon: Elmira Newman PARAS, MD;  Location: [facility];  Service: Cardiovascular;  Laterality: N/A;   CORONARY/GRAFT ACUTE MI REVASCULARIZATION N/A 01/31/2023   Procedure: Coronary/Graft Acute MI Revascularization;  Surgeon: Elmira Newman PARAS, MD;  Location: [facility];  Service: Cardiovascular;  Laterality: N/A;   DILATION AND CURETTAGE OF UTERUS     multiple   ENDARTERECTOMY Right 06/09/2019   ENDARTERECTOMY Right 06/09/2019   Procedure: ENDARTERECTOMY CAROTID RIGHT;  Surgeon: Harvey Carlin BRAVO, MD;  Location: [facility];  Service: Vascular;  Laterality: Right;   IABP INSERTION N/A 02/02/2023   Procedure: IABP Insertion;  Surgeon: Elmira Newman PARAS, MD;  [facility]: [facility];  Service: Cardiovascular;  Laterality: N/A;   INTRAUTERINE DEVICE INSERTION     IUD REMOVAL     LEFT HEART CATH AND CORONARY ANGIOGRAPHY N/A 02/27/2020   Procedure: LEFT HEART CATH AND CORONARY ANGIOGRAPHY;  Surgeon: Ladona Heinz, MD;  [facility]: [facility];  Service: Cardiovascular;  Laterality: N/A;   LEFT HEART CATH AND CORONARY ANGIOGRAPHY N/A 01/31/2023   Procedure: LEFT HEART CATH AND CORONARY ANGIOGRAPHY;  Surgeon: Elmira Newman PARAS, MD;  Location: [facility];  Service: Cardiovascular;  Laterality: N/A;   MASTECTOMY PARTIAL / LUMPECTOMY W/ AXILLARY LYMPHADENECTOMY Right    lumpectomy and lymph nodes removed   PATCH ANGIOPLASTY Right 06/09/2019   Procedure: Patch Angioplasty of right carotid artery using hemashield paltinum finesse patch;  Surgeon: Harvey Carlin BRAVO, MD;  Location: [facility] OR;  Service: Vascular;  Laterality: Right;   RIGHT AND LEFT HEART CATH N/A 02/01/2023   Procedure: RIGHT AND LEFT HEART CATH;  Surgeon: Elmira Newman PARAS, MD;  [facility];  Service: Cardiovascular;  Laterality: N/A;   RIGHT HEART CATH N/A 02/02/2023   Procedure: RIGHT HEART CATH;  Surgeon: Cherrie Toribio SAUNDERS, MD;  Location:  [facility];  Service: Cardiovascular;  Laterality: N/A;   RIGHT/LEFT HEART CATH AND CORONARY ANGIOGRAPHY N/A 01/27/2023   Procedure: RIGHT/LEFT HEART CATH AND CORONARY ANGIOGRAPHY;  Surgeon: Elmira Newman PARAS, MD;  [facility];  Service: Cardiovascular;  Laterality: N/A;   TEE WITHOUT CARDIOVERSION N/A 02/04/2023   Procedure: TRANSESOPHAGEAL ECHOCARDIOGRAM;  Surgeon: Wendel Lurena MARLA, MD;  Location: [facility] INVASIVE CV LAB;  Service: Open Heart Surgery;  Laterality: N/A;   TEMPORARY PACEMAKER N/A 02/02/2023   Procedure: TEMPORARY PACEMAKER;  Surgeon: Cherrie Toribio SAUNDERS, MD;  [facility];  Service: Cardiovascular;  Laterality: N/A;   TRANSCATHETER AORTIC VALVE REPLACEMENT, TRANSFEMORAL N/A 02/04/2023   Procedure: Transcatheter Aortic Valve Replacement, Transfemoral;  Surgeon:  Thukkani, Arun K, MD;  Location: [facility];  Service: Open Heart Surgery;  Laterality: N/A;   TUBAL LIGATION     VULVA SURGERY     Multiple times for dysplasia   Past Medical History:  Diagnosis Date   Anxiety    Arthritis    low back and hip pain intermittent   Breast cancer (HCC) 06/02/07   r breast -surgery ,radiaology. chemotherapy   Carpal tunnel syndrome    right hand   Colon polyps    Complication of anesthesia    Fentanyl , Versed -makes extra hyper, bradycardia x 1 in PACU, [facility] (08/15/11 cardiology felt neostigmine may have resulted in AV nodal block)    Coronary artery disease    Depression    denies   Dysplasia of vulva    Hypertension    Palpitations    PSVT, s/p adenosine  08/04/16   S/P breast lumpectomy 07/04/07   R breast   S/P radiation therapy 2009   There were no vitals taken for this visit.  Opioid Risk Score:   Fall Risk Score:  `1  Depression screen PHQ 2/9     05/10/2023   11:59 AM 01/19/2020    1:33 PM 10/04/2019    2:40 PM 07/03/2019   10:46 AM 02/07/2019    2:16 PM 05/24/2018    2:52 PM 10/15/2017    1:36 PM  Depression screen PHQ 2/9   Decreased Interest 0 0 0 1 0 0 0  Down, Depressed, Hopeless 0 0 0 1 0 0 0  PHQ - 2 Score 0 0 0 2 0 0 0  Altered sleeping 0   0     Tired, decreased energy 2   1     Change in appetite 0   0     Feeling bad or failure about yourself  0   0     Trouble concentrating 0   0     Moving slowly or fidgety/restless 0   0     Suicidal thoughts 0   0     PHQ-9 Score 2   3     Difficult doing work/chores Somewhat difficult           Review of Systems  Respiratory:  Positive for cough and wheezing.   Musculoskeletal:  Positive for back pain.  Psychiatric/Behavioral:  The patient is nervous/anxious.   All other systems reviewed and are negative.      Objective:   Physical Exam Gen: no distress, normal appearing HEENT: oral mucosa pink and moist, NCAT Cardio: Reg rate Chest: normal effort, normal rate of breathing Abd: soft, non-distended Ext: no edema Psych: pleasant, normal affect Skin: intact Neuro: Alert and oriented x3 MSK: 5/5 strength throughout, TTP lower back, limited lumbar spine ROM, stable 07/05/23     Assessment & Plan:   1) Debility: -discussed that she is thankful for her recovery -discussed that she has returned to work -discussed that se has established care with a new PCP -will refer to cardiac rehab  2) Chronic cough/hemoptysis: -discussed that she followed up with her PCP -discussed that she had a CXR  -replaced pulmonology referral -discussed potential etiologies  3) Cervical spine pain -discussed that flexed posture can contribute greatly -reviewed MRI results  -refilled oxycodone   4) Muscle weakness: -discussed that she thinks this is 2/2 to her statin -recommended Coenzyme Q10, discussed that she used to take this in the past -continue Crestor  -discussed that she has aching in her legs and  feels she has been run over by a truck  5) History of chest pain/CAD: -discussed her prior symptoms and stent placement  6) Desaturation: -desatting to  87% -discussed that she feels well -referred to pulmonology   7) Chronic low back pain -UDS and pain contract performed  -discussed that she may need oxycodone  2-3 x/week 5mg  oxycodone , prescribed, advised to abstain from alcohol   8) Headaches:  -discussed that sleep apnea could contribute to these -discussed that when she follows with pulmonology she can ask to be assessed for sleep apnea  40 minutes spent in discussion of her headaches, spinal pain, cardiac arrest in the hospital, discussion of her stress test and how she was initially diagnosed with pneumonia, reviewed her cervical spine MRI, refilled her oxycodone , discussed that buspar  has been helpful and she follows with a psychiatrist, discussed that sleep apnea can increase risk of headaches and she has never been tested, discussed that she does wake up unrefreshed, discussed that she when she follows up with pulmonology she can ask about sleep apnea testing if she wants, discussed that she was told to take hald a lasix  if she has lower extremity swelling, discussed that [facility] so [facility]

## 2023-07-07 ENCOUNTER — Ambulatory Visit (HOSPITAL_COMMUNITY): Payer: Medicare Other | Attending: Cardiology

## 2023-07-07 DIAGNOSIS — I5020 Unspecified systolic (congestive) heart failure: Secondary | ICD-10-CM | POA: Insufficient documentation

## 2023-07-07 LAB — ECHOCARDIOGRAM COMPLETE
AV Mean grad: 5.8 mm[Hg]
AV Peak grad: 10.1 mm[Hg]
Ao pk vel: 1.59 m/s
Area-P 1/2: 3.95 cm2
S' Lateral: 4.8 cm

## 2023-07-07 MED ORDER — PERFLUTREN LIPID MICROSPHERE
1.0000 mL | INTRAVENOUS | Status: AC | PRN
  Administered 2023-07-07: 1 mL via INTRAVENOUS

## 2023-07-08 ENCOUNTER — Other Ambulatory Visit: Payer: Self-pay

## 2023-07-08 ENCOUNTER — Encounter: Payer: Self-pay | Admitting: Cardiology

## 2023-07-08 DIAGNOSIS — I5020 Unspecified systolic (congestive) heart failure: Secondary | ICD-10-CM

## 2023-07-08 DIAGNOSIS — Z952 Presence of prosthetic heart valve: Secondary | ICD-10-CM

## 2023-07-08 DIAGNOSIS — I10 Essential (primary) hypertension: Secondary | ICD-10-CM

## 2023-07-08 DIAGNOSIS — I5042 Chronic combined systolic (congestive) and diastolic (congestive) heart failure: Secondary | ICD-10-CM

## 2023-07-08 NOTE — Progress Notes (Signed)
EF remains low, although better pre TAVR. She does need GDMT for HFrEF. Recommend pharmD visit to up titrate GDMT (has had a lot of tolerance issues in the past).  Thanks MJP

## 2023-07-09 ENCOUNTER — Telehealth: Payer: Self-pay | Admitting: Cardiology

## 2023-07-09 NOTE — Telephone Encounter (Signed)
Patient is concerned about starting medications to help resolve her health concerns. Patient would like to know if there is another alternative for her besides medication. Patient sent a patient message on 07/08/23. Please advise.

## 2023-07-09 NOTE — Telephone Encounter (Signed)
See mychart message sent to the pt by Dr. Rosemary Holms, in regards to this encounter.

## 2023-07-09 NOTE — Telephone Encounter (Signed)
Patient requested for [place] to not contact her before 11:30 AM.

## 2023-07-09 NOTE — Telephone Encounter (Signed)
That should not affect your heart function.  It is true that I would like you to drink water regularly to avoid having side effects from medications that are good for your heart.  Please continue follow-up with me and Pharm.D. office so we can try and uptitrate your medications as best tolerated.  Thanks MJP

## 2023-07-12 ENCOUNTER — Telehealth (HOSPITAL_COMMUNITY): Payer: Self-pay

## 2023-07-12 ENCOUNTER — Encounter (HOSPITAL_COMMUNITY): Payer: Self-pay

## 2023-07-12 NOTE — Telephone Encounter (Signed)
Pt insurance is active and benefits verified through Medicare A.B. Co-pay $0.00, DED $257.00/$90.19 met, out of pocket $0.00/$0.00 met, co-insurance 20%. No pre-authorization required. Passport, 07/12/23 @ 1:35PM, REF#[id number]   How many CR sessions are covered? (36 visits for TCR, 72 visits for ICR)72 Is this a lifetime maximum or an annual maximum? Lifetime Has the member used any of these services to date? No Is there a time limit (weeks/months) on start of program and/or program completion? No

## 2023-07-12 NOTE — Telephone Encounter (Signed)
Pt is scheduled for PharmD appt for HF med management on 07/16/23 at 3:30 pm.  Pt made aware of appt date and time by [facility] Scheduling team.

## 2023-07-12 NOTE — Telephone Encounter (Signed)
Called patient to see if she was interested in participating in the Cardiac Rehab Program. Patient stated yes. Patient will come in for orientation on 07/26/23 @ 130PM and will attend the 1145AM exercise class.   Pensions consultant.

## 2023-07-14 ENCOUNTER — Telehealth: Payer: Self-pay | Admitting: Cardiology

## 2023-07-14 MED ORDER — LOSARTAN POTASSIUM 25 MG PO TABS: 25.0000 mg | ORAL_TABLET | Freq: Every day | ORAL | 3 refills | Status: DC

## 2023-07-14 NOTE — Telephone Encounter (Signed)
Pt states called in stating she has been out of Losartan for 5 days. She states Dr. Rosemary Holms changed the rx to take once daily but the rx is written for 0.5 daily and she cannot get from pharmacy until 08/06/23. She states she does not want to pick up from local pharmacy because it will cost too much, she only wants to use mail order. Please advise. Not sure if she can maybe get samples until her mail order can be delivered.

## 2023-07-14 NOTE — Telephone Encounter (Signed)
Spoke to patient, prescription sent with updated sig to preferred pharmacy.

## 2023-07-14 NOTE — Telephone Encounter (Signed)
Will route this call to our Pharmacist in [facility], for they advised on this medication back on (11/26 telephone) encounter and have been following the pt in their clinic.  Will forward this to PharmD pool for further management.

## 2023-07-16 ENCOUNTER — Ambulatory Visit: Payer: Medicare Other | Attending: Cardiology | Admitting: Pharmacist

## 2023-07-16 VITALS — BP 130/76 | HR 81

## 2023-07-16 DIAGNOSIS — E782 Mixed hyperlipidemia: Secondary | ICD-10-CM | POA: Insufficient documentation

## 2023-07-16 DIAGNOSIS — I5042 Chronic combined systolic (congestive) and diastolic (congestive) heart failure: Secondary | ICD-10-CM | POA: Insufficient documentation

## 2023-07-16 DIAGNOSIS — I5021 Acute systolic (congestive) heart failure: Secondary | ICD-10-CM | POA: Insufficient documentation

## 2023-07-16 MED ORDER — EZETIMIBE 10 MG PO TABS: 10.0000 mg | ORAL_TABLET | Freq: Every day | ORAL | 3 refills | Status: DC

## 2023-07-16 NOTE — Patient Instructions (Addendum)
Please resume taking losartan 25mg  daily Please resume Farxiga 10mg  daily   Please start taking ezetimibe (Zetia) daily Lab work in 1 month

## 2023-07-16 NOTE — Progress Notes (Unsigned)
Patient ID: Madison West                 DOB: Apr 22, 1956                      MRN: [id number]     HPI: Madison West is a 68 y.o. female referred by Madison West to pharmacy clinic for HF medication management. PMH is significant for HTN, CAD s/p carotid endarterectomy (2020), RI stent (2021), PCI > STEMI (Aug 2024), HFrEF, VF arrest 2/2 stent-thrombosis, DVT (on Eliquis), breast cancer (s/p lumpectomy and radiation in 2009), HLD. Most recent LVEF 35-40% 07/07/23.   Most recently seen by Madison West on 04/15/23. She had a prolonged hospitalization from 01/23/28 to 02/18/23. She underwent stent placement on 8/7 for 95% occluded LAD lesion, then had pulseless VT with ROSC, then recurrent VT with EKG demonstrating STEMI due to stent thrombosis - treated with thrombectomy, IABP placement. Underwent TAVR on 8/15. Hospital course further complicated by PNA, DVT. 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 Reported recent low BP (80s/40s) with lightheadedness. She discontinued metoprolol, Madison West, Entresto, and spironolactone. She endorses intolerances to these medications - has a history of being hesitant to take medications. Reported she was only drink 1200 cc of water/West. BP in clinic 124/72 (off medication), HR 79. Noted LDL of 146 mg/dL, Lp(a) of 829 on rosuvastatin 20. Referred for titration of HF medications and discussion of additional lipid-lowering medication. At last visit, agreed to resume Entresto 24-26 mg PO BID. At her physical rehab appt on 11/18 she reported muscle weakness, which she feels is related to her statin (aching in her legs, feels she has been run over by a truck). She also called in reporting blood-tinged sputum, O2 to 87% - referred to pulmonology. Gets her medications from [facility].  Seen by me on 05/13/23. She had resumed Entresto but had some dizziness. [name] was stopped and losartan 12.5mg  daily was started. She was  asked to resume [place] 10mg  daily. Her rosuvastatin was decreased to 10mg  daily and she was scheduled to discuss further. Her losartan was increased to 25mg  daily on 06/03/23. Echo 07/07/23 showed an EF of 35-40% (slightly lower than previous). She was referred back to PharmD clinic. Patient saw Madison West on 12/30 and was started on zetia. Patient has fear of needles and [name] wont pay for PCSK9i until pt has tried Zetia.  Patient presents today for follow-up.  She was unsure if she was supposed to be taking [place].  She has not been taking it.  She ran out of losartan and [name] says it will not be shipped until February 14.  She had some losartan potassium at home but was not sure if that was the same thing so she did not take anything for 2 days and then today she took a half a tablet.  Blood pressure this morning at home was 128/78.  States her heart rate runs between 75 and 80.  Patient is very scared and concerned about the small decrease in her ejection fraction from previous echo.  Her hospital experience has left her very scared.  Also frustrated with situations at home.  Her children live with her but she does not feel like they pull there own weight.  Has not needed to take furosemide.  Weight has been stable around 200-203.  She is scared to take new medications. She refuses to try metoprolol again as  she states that she was told never to take it again and it dropped her blood pressure to 70 systolic.  Of note she was on Entresto at that time as well.  Spent time reviewing with patient the importance of 4 classes of guideline directed medical therapy.  Hyperlipidemia Current Medications: rosuvastatin 10 mg PO daily, zetia 10mg  daily Intolerances: atorvastatin 10 mg, rosuvastatin 20mg  daily Risk Factors: progressive CAD, former smoker, age LDL-C goal: <55 mg/dL ApoB goal: <62 mg/dL  Current CHF meds: furosemide 10 mg PO daily PRN, losartan 25mg  daily, Farxiga 10mg  (has not been  taking )  Previously tried: spironolactone 12.5, Farxiga 10  Adherence Assessment  Do you ever forget to take your medication? [] Yes [x] No  Do you ever skip doses due to side effects? [] Yes [x] No  Do you have trouble affording your medicines? - free at [facility] [place] (mail order) [] Yes [x] No  Are you ever unable to pick up your medication due to transportation difficulties? [] Yes [x] No  Do you ever stop taking your medications because you don't believe they are helping? [x] Yes [] No  Do you check your weight daily? [x] Yes [] No   Adherence strategy: did not discuss today  Barriers to obtaining medications: Prefers to get medications through [facility] mail order because they are free  BP goal: <130/80 mmHg  Family History:  Family History  Problem Relation Age of Onset   Colon cancer Mother 3   Heart disease Father        heart attack   Colon polyps Brother        one brother had large colon polyp that needed surgery to be removed   Heart disease Paternal Grandfather    Diabetes type II Brother     Social History: Works part time as a Leisure centre manager. Former smoker- ~15 pack yr hx. Denies current alcohol use.  Diet: Reports that the people in her house do not follow a heart healthy diet. Last night she had a small serving of fettuccine alfredo for dinner. She does not add salt to her foods.   Exercise: Staying active at work ([employer]). Did not discuss exercise regimen.   Home BP readings: Reports 128/78 at home this morning after all not taking losartan for 2 days and taking 12.5 mg this morning  Wt Readings from Last 3 Encounters:  07/05/23 202 lb (91.6 kg)  06/03/23 203 lb (92.1 kg)  05/16/23 200 lb (90.7 kg)   BP Readings from Last 3 Encounters:  07/16/23 130/76  07/05/23 121/80  06/03/23 132/76   Pulse Readings from Last 3 Encounters:  07/16/23 81  07/05/23 86  06/03/23 81    Renal function: CrCl cannot be calculated (Patient's most recent lab result is older than the  maximum 21 days allowed.).  Labs: Lipid Panel     Component Value Date/Time   CHOL 220 (H) 12/30/2022 1604   TRIG 91 02/04/2023 0207   HDL 51 12/30/2022 1604   CHOLHDL 4.2 01/19/2020 1434   CHOLHDL 6.7 12/17/2011 0949   VLDL 48 (H) 12/17/2011 0949   LDLCALC 146 (H) 12/30/2022 1604   LABVLDL 23 12/30/2022 1604   Lp(a) 227.2 mg/dL (06/24/06)  Past Medical History:  Diagnosis Date   Anxiety    Arthritis    low back and hip pain intermittent   Breast cancer (HCC) 06/02/07   r breast -surgery ,radiaology. chemotherapy   Carpal tunnel syndrome    right hand   Colon polyps    Complication of anesthesia  Fentanyl, Versed-makes extra hyper, bradycardia x 1 in PACU, [facility] (08/15/11 cardiology felt neostigmine may have resulted in AV nodal block)    Coronary artery disease    Depression    denies   Dysplasia of vulva    Hypertension    Palpitations    PSVT, s/p adenosine 08/04/16   S/P breast lumpectomy 07/04/07   R breast   S/P radiation therapy 2009    Current Outpatient Medications on File Prior to Visit  Medication Sig Dispense Refill   alprazolam (XANAX) 2 MG tablet Take 0.25-2 mg by mouth See admin instructions. 1 mg (may take up to 2 mg) twice daily. May also take 0.25 mg during the West if needed for anxiety.     amiodarone (PACERONE) 200 MG tablet Take 1 tablet (200 mg total) by mouth daily. 30 tablet 0   apixaban (ELIQUIS) 5 MG TABS tablet Take 1 tablet (5 mg total) by mouth 2 (two) times daily. 14 tablet 0   busPIRone (BUSPAR) 10 MG tablet TAKE 1 TABLET BY MOUTH TWICE A West (Patient taking differently: Take 5 mg by mouth 2 (two) times daily.) 180 tablet 1   clopidogrel (PLAVIX) 75 MG tablet Take 1 tablet (75 mg total) by mouth daily. 90 tablet 3   losartan (COZAAR) 25 MG tablet Take 1 tablet (25 mg total) by mouth daily. 90 tablet 3   oxyCODONE (ROXICODONE) 5 MG immediate release tablet Take 1 tablet (5 mg total) by mouth daily as needed for severe pain (pain score 7-10).  30 tablet 0   rosuvastatin (CRESTOR) 20 MG tablet Take 0.5 tablets (10 mg total) by mouth daily.     acetaminophen (TYLENOL) 325 MG tablet Take 2 tablets (650 mg total) by mouth every 6 (six) hours. (Patient taking differently: Take 500 mg by mouth every 6 (six) hours.)     dapagliflozin propanediol (FARXIGA) 10 MG TABS tablet Take 1 tablet (10 mg total) by mouth daily before breakfast. (Patient not taking: Reported on 07/16/2023) 90 tablet 3   furosemide (LASIX) 20 MG tablet Take 0.5 tablets (10 mg total) by mouth daily as needed (for lower extremity swelling and weight gain). 45 tablet 1   gabapentin (NEURONTIN) 300 MG capsule Take 1 capsule (300 mg total) by mouth 3 (three) times daily. (Patient not taking: Reported on 07/16/2023) 90 capsule 0   methocarbamol (ROBAXIN) 500 MG tablet Take 1 tablet (500 mg total) by mouth 2 (two) times daily. (Patient not taking: Reported on 07/16/2023) 60 tablet 0   nitroGLYCERIN (NITROSTAT) 0.4 MG SL tablet Place 1 tablet (0.4 mg total) under the tongue every 5 (five) minutes as needed for chest pain. 25 tablet 2   No current facility-administered medications on file prior to visit.    Allergies  Allergen Reactions   Prednisone Palpitations   Fentanyl Swelling and Other (See Comments)    Makes pt "hyper" feels she is "climbing the walls."  Last time she had a colonoscopy with Fentanyl and Versed she was awake the whole time.   Versed [Midazolam] Swelling and Anxiety    Makes pt "hyper" feels she is "climbing the walls."  Last time she had a colonoscopy with Fentanyl and Versed she was awake the whole time.   Vicodin [Hydrocodone-Acetaminophen] Anxiety    Extreme anxiety.  OK to take oxycodone.     Assessment/Plan:  CHF (congestive heart failure) (HCC) Assessment: Blood pressure is well-controlled today She has some losartan at home and I assured her that losartan potassium is  the same thing She has plenty of Farxiga at home and is willing to  resume She does not want to take metoprolol   Plan: Resume losartan at 25 mg daily Resume Farxiga 10 mg daily Check lab work in 1 month Follow-up scheduled with Madison West in March I will follow-up via phone call with patient in 2 weeks  Mixed hyperlipidemia Assessment: Patient is taking rosuvastatin 10 mg daily She has not started Zetia yet.  She did not get it from the local pharmacy I have sent a prescription to [facility] She admits she is scared to start Zetia  Plan: Continue rosuvastatin 10 mg daily Start Zetia once it comes from [facility] [place]   Thank you    Olene Floss, Pharm.D, BCACP, BCPS, CPP [name] [address] [place], [place] [address]  Phone: [phone]; Fax: [phone]

## 2023-07-16 NOTE — Assessment & Plan Note (Signed)
Assessment: Patient is taking rosuvastatin 10 mg daily She has not started Zetia yet.  She did not get it from the local pharmacy I have sent a prescription to [facility] She admits she is scared to start Zetia  Plan: Continue rosuvastatin 10 mg daily Start Zetia once it comes from [facility] [place]

## 2023-07-16 NOTE — Assessment & Plan Note (Signed)
Assessment: Blood pressure is well-controlled today She has some losartan at home and I assured her that losartan potassium is the same thing She has plenty of Farxiga at home and is willing to resume She does not want to take metoprolol   Plan: Resume losartan at 25 mg daily Resume Farxiga 10 mg daily Check lab work in 1 month Follow-up scheduled with Dr. Rosemary Holms in March I will follow-up via phone call with patient in 2 weeks

## 2023-07-23 ENCOUNTER — Telehealth (HOSPITAL_COMMUNITY): Payer: Self-pay

## 2023-07-23 NOTE — Telephone Encounter (Signed)
  Called pt to confirm appt for 07/26/23 at 1330. Gave pt instructions for appt, what to wear, office address, eating/taking meds before, and if sick to call and reschedule. Pt voiced understanding, all questions answered.   Health history completed? Yes   Jonna Coup, MS, ACSM-CEP 07/23/2023 1:41 PM

## 2023-07-26 ENCOUNTER — Encounter (HOSPITAL_COMMUNITY)
Admission: RE | Admit: 2023-07-26 | Discharge: 2023-07-26 | Disposition: A | Discharge status: Home / Self Care | Payer: Medicare Other | Source: Ambulatory Visit | Attending: Cardiology | Admitting: Cardiology

## 2023-07-26 VITALS — BP 112/60 | HR 68 | Ht 68.0 in | Wt 202.8 lb

## 2023-07-26 DIAGNOSIS — I2102 ST elevation (STEMI) myocardial infarction involving left anterior descending coronary artery: Secondary | ICD-10-CM

## 2023-07-26 DIAGNOSIS — Z955 Presence of coronary angioplasty implant and graft: Secondary | ICD-10-CM

## 2023-07-26 DIAGNOSIS — I252 Old myocardial infarction: Secondary | ICD-10-CM | POA: Insufficient documentation

## 2023-07-26 DIAGNOSIS — Z48812 Encounter for surgical aftercare following surgery on the circulatory system: Secondary | ICD-10-CM | POA: Insufficient documentation

## 2023-07-26 DIAGNOSIS — Z952 Presence of prosthetic heart valve: Secondary | ICD-10-CM

## 2023-07-26 NOTE — Progress Notes (Signed)
Cardiac Rehab Medication Review   Does the patient  feel that his/her medications are working for him/her?  YES   Has the patient been experiencing any side effects to the medications prescribed?   NO  Does the patient measure his/her own blood pressure or blood glucose at home?  YES    Does the patient have any problems obtaining medications due to transportation or finances?  NO  Understanding of regimen: good Understanding of indications: good Potential of compliance: fair    Comments: Madison West understands her medications and regime, however there are several medications that Madison West stated she does not want to take. She checks her BP and SpO2 daily.     Madison Coup, MS, ACSM-CEP 07/26/2023 2:34 PM

## 2023-07-26 NOTE — Progress Notes (Signed)
Cardiac Individual Treatment Plan  Patient Details  Name: Madison West MRN: [id number] Date of Birth: Nov 20, 1955 Referring Provider:   Flowsheet Row INTENSIVE CARDIAC REHAB ORIENT from 07/26/2023 in [facility]  Referring Provider Truett Mainland, MD       Initial Encounter Date:  Flowsheet Row INTENSIVE CARDIAC REHAB ORIENT from 07/26/2023 in [facility] Heart, V[facility], [facility] Lung [facility]  Date 07/26/23       Visit Diagnosis: 02/04/23 S/P TAVR (transcatheter aortic valve replacement)  02/01/23 STEMI  01/29/23 DES LAD  Patient's Home Medications on Admission:  Current Outpatient Medications:    acetaminophen (TYLENOL) 325 MG tablet, Take 2 tablets (650 mg total) by mouth every 6 (six) hours. (Patient taking differently: Take 500 mg by mouth every 6 (six) hours.), Disp: , Rfl:    alprazolam (XANAX) 2 MG tablet, Take 0.25-2 mg by mouth See admin instructions. 1 mg (may take up to 2 mg) twice daily. May also take 0.25 mg during the day if needed for anxiety., Disp: , Rfl:    amiodarone (PACERONE) 200 MG tablet, Take 1 tablet (200 mg total) by mouth daily., Disp: 30 tablet, Rfl: 0   apixaban (ELIQUIS) 5 MG TABS tablet, Take 1 tablet (5 mg total) by mouth 2 (two) times daily., Disp: 14 tablet, Rfl: 0   busPIRone (BUSPAR) 10 MG tablet, TAKE 1 TABLET BY MOUTH TWICE A DAY (Patient taking differently: Take 5 mg by mouth 2 (two) times daily.), Disp: 180 tablet, Rfl: 1   clopidogrel (PLAVIX) 75 MG tablet, Take 1 tablet (75 mg total) by mouth daily., Disp: 90 tablet, Rfl: 3   dapagliflozin propanediol (FARXIGA) 10 MG TABS tablet, Take 1 tablet (10 mg total) by mouth daily before breakfast., Disp: 90 tablet, Rfl: 3   furosemide (LASIX) 20 MG tablet, Take 0.5 tablets (10 mg total) by mouth daily as needed (for lower extremity swelling and weight gain)., Disp: 45 tablet, Rfl: 1   losartan (COZAAR) 25 MG tablet,  Take 1 tablet (25 mg total) by mouth daily., Disp: 90 tablet, Rfl: 3   methocarbamol (ROBAXIN) 500 MG tablet, Take 1 tablet (500 mg total) by mouth 2 (two) times daily., Disp: 60 tablet, Rfl: 0   nitroGLYCERIN (NITROSTAT) 0.4 MG SL tablet, Place 1 tablet (0.4 mg total) under the tongue every 5 (five) minutes as needed for chest pain., Disp: 25 tablet, Rfl: 2   oxyCODONE (ROXICODONE) 5 MG immediate release tablet, Take 1 tablet (5 mg total) by mouth daily as needed for severe pain (pain score 7-10)., Disp: 30 tablet, Rfl: 0   rosuvastatin (CRESTOR) 20 MG tablet, Take 0.5 tablets (10 mg total) by mouth daily., Disp: , Rfl:    ezetimibe (ZETIA) 10 MG tablet, Take 1 tablet (10 mg total) by mouth daily. (Patient not taking: Reported on 07/26/2023), Disp: 90 tablet, Rfl: 3   gabapentin (NEURONTIN) 300 MG capsule, Take 1 capsule (300 mg total) by mouth 3 (three) times daily. (Patient not taking: Reported on 07/26/2023), Disp: 90 capsule, Rfl: 0  Past Medical History: Past Medical History:  Diagnosis Date   Anxiety    Arthritis    low back and hip pain intermittent   Breast cancer (HCC) 06/02/07   r breast -surgery ,radiaology. chemotherapy   Carpal tunnel syndrome    right hand   Colon polyps    Complication of anesthesia    Fentanyl, Versed-makes extra hyper, bradycardia x 1 in PACU,  [facility] (08/15/11 cardiology felt neostigmine may have resulted in AV nodal block)    Coronary artery disease    Depression    denies   Dysplasia of vulva    Hypertension    Palpitations    PSVT, s/p adenosine 08/04/16   S/P breast lumpectomy 07/04/07   R breast   S/P radiation therapy 2009    Tobacco Use: Social History   Tobacco Use  Smoking Status Former   Current packs/day: 0.00   Average packs/day: 0.8 packs/day for 20.0 years (15.0 ttl pk-yrs)   Types: Cigarettes   Start date: 05/06/1977   Quit date: 05/06/1997   Years since quitting: 26.2  Smokeless Tobacco Never    Labs: Review Flowsheet  More  data exists      Latest Ref Rng & Units 02/11/2023 02/12/2023 02/13/2023 02/14/2023 06/21/2023  Labs for ITP Cardiac and Pulmonary Rehab  Cholestrol 100 - 199 mg/dL - - - - 161   LDL (calc) 0 - 99 mg/dL - - - - 94   HDL-C >09 mg/dL - - - - 67   Trlycerides 0 - 149 mg/dL - - - - 604   O2 Saturation % 77.2  76.1  68.3  60.4  -    Capillary Blood Glucose: Lab Results  Component Value Date   GLUCAP 87 02/19/2023   GLUCAP 103 (H) 02/19/2023   GLUCAP 121 (H) 02/18/2023   GLUCAP 111 (H) 02/18/2023   GLUCAP 110 (H) 02/17/2023     Exercise Target Goals: Exercise Program Goal: Individual exercise prescription set using results from initial 6 min walk test and THRR while considering  patient's activity barriers and safety.   Exercise Prescription Goal: Initial exercise prescription builds to 30-45 minutes a day of aerobic activity, 2-3 days per week.  Home exercise guidelines will be given to patient during program as part of exercise prescription that the participant will acknowledge.  Activity Barriers & Risk Stratification:  Activity Barriers & Cardiac Risk Stratification - 07/26/23 1419       Activity Barriers & Cardiac Risk Stratification   Activity Barriers Deconditioning;Back Problems;Neck/Spine Problems;Balance Concerns;Decreased Ventricular Function    Cardiac Risk Stratification High   <5 METs on            6 Minute Walk:  6 Minute Walk     Row Name 07/26/23 1619         6 Minute Walk   Phase Initial     Distance 1180 feet     Walk Time 6 minutes     # of Rest Breaks 1  2:20-3:20 3/10 hip pain     MPH 2.23     METS 2.6     RPE 10     Perceived Dyspnea  1     VO2 Peak 9.11     Symptoms Yes (comment)     Comments Pt SpO2 down to 84%, recovered to 94% in around a minute and maintained. RPD = 1, resolved with rest. 6/10 Hip pain at end of     Resting HR 68 bpm     Resting BP 112/60     Resting Oxygen Saturation  92 %     Exercise Oxygen Saturation   during 6 min walk 84 %     Max Ex. HR 100 bpm     Max Ex. BP 136/66     2 Minute Post BP 120/62              Oxygen  Initial Assessment:   Oxygen Re-Evaluation:   Oxygen Discharge (Final Oxygen Re-Evaluation):   Initial Exercise Prescription:  Initial Exercise Prescription - 07/26/23 1600       Date of Initial Exercise RX and Referring Provider   Date 07/26/23    Referring Provider Truett Mainland, MD    Expected Discharge Date 10/20/23      Recumbant Bike   Level 1    RPM 50    Watts 30    Minutes 15    METs 2      NuStep   Level 1    SPM 60    Minutes 15    METs 2.5      Prescription Details   Frequency (times per week) 3    Duration Progress to 30 minutes of continuous aerobic without signs/symptoms of physical distress      Intensity   THRR 40-80% of Max Heartrate 60-121    Ratings of Perceived Exertion 11-13    Perceived Dyspnea 0-4      Progression   Progression Continue progressive overload as per policy without signs/symptoms or physical distress.      Resistance Training   Training Prescription Yes    Weight 2    Reps 10-15             Perform Capillary Blood Glucose checks as needed.  Exercise Prescription Changes:   Exercise Comments:   Exercise Goals and Review:   Exercise Goals     Row Name 07/26/23 1623             Exercise Goals   Increase Physical Activity Yes       Intervention Provide advice, education, support and counseling about physical activity/exercise needs.;Develop an individualized exercise prescription for aerobic and resistive training based on initial evaluation findings, risk stratification, comorbidities and participant's personal goals.       Expected Outcomes Short Term: Attend rehab on a regular basis to increase amount of physical activity.;Long Term: Exercising regularly at least 3-5 days a week.;Long Term: Add in home exercise to make exercise part of routine and to increase amount of physical  activity.       Increase Strength and Stamina Yes       Intervention Develop an individualized exercise prescription for aerobic and resistive training based on initial evaluation findings, risk stratification, comorbidities and participant's personal goals.;Provide advice, education, support and counseling about physical activity/exercise needs.       Expected Outcomes Short Term: Increase workloads from initial exercise prescription for resistance, speed, and METs.;Short Term: Perform resistance training exercises routinely during rehab and add in resistance training at home;Long Term: Improve cardiorespiratory fitness, muscular endurance and strength as measured by increased METs and functional capacity ( )       Able to understand and use rate of perceived exertion (RPE) scale Yes       Intervention Provide education and explanation on how to use RPE scale       Expected Outcomes Short Term: Able to use RPE daily in rehab to express subjective intensity level;Long Term:  Able to use RPE to guide intensity level when exercising independently       Knowledge and understanding of Target Heart Rate Range (THRR) Yes       Intervention Provide education and explanation of THRR including how the numbers were predicted and where they are located for reference       Expected Outcomes Short Term: Able to state/look up THRR;Short Term: Able to use  daily as guideline for intensity in rehab;Long Term: Able to use THRR to govern intensity when exercising independently       Understanding of Exercise Prescription Yes       Intervention Provide education, explanation, and written materials on patient's individual exercise prescription       Expected Outcomes Short Term: Able to explain program exercise prescription;Long Term: Able to explain home exercise prescription to exercise independently                Exercise Goals Re-Evaluation :   Discharge Exercise Prescription (Final Exercise Prescription  Changes):   Nutrition:  Target Goals: Understanding of nutrition guidelines, daily intake of sodium 1500mg , cholesterol 200mg , calories 30% from fat and 7% or less from saturated fats, daily to have 5 or more servings of fruits and vegetables.  Biometrics:  Pre Biometrics - 07/26/23 1416       Pre Biometrics   Waist Circumference 48 inches    Hip Circumference 51 inches    Waist to Hip Ratio 0.94 %    Triceps Skinfold 16 mm    % Body Fat 41.8 %    Grip Strength 20 kg    Flexibility 13.5 in    Single Leg Stand 11.25 seconds              Nutrition Therapy Plan and Nutrition Goals:   Nutrition Assessments:  MEDIFICTS Score Key: >=70 Need to make dietary changes  40-70 Heart Healthy Diet <= 40 Therapeutic Level Cholesterol Diet    Picture Your Plate Scores: <01 Unhealthy dietary pattern with much room for improvement. 41-50 Dietary pattern unlikely to meet recommendations for good health and room for improvement. 51-60 More healthful dietary pattern, with some room for improvement.  >60 Healthy dietary pattern, although there may be some specific behaviors that could be improved.    Nutrition Goals Re-Evaluation:   Nutrition Goals Re-Evaluation:   Nutrition Goals Discharge (Final Nutrition Goals Re-Evaluation):   Psychosocial: Target Goals: Acknowledge presence or absence of significant depression and/or stress, maximize coping skills, provide positive support system. Participant is able to verbalize types and ability to use techniques and skills needed for reducing stress and depression.  Initial Review & Psychosocial Screening:  Initial Psych Review & Screening - 07/26/23 1420       Initial Review   Current issues with Current Anxiety/Panic;Current Stress Concerns    Source of Stress Concerns Chronic Illness;Family    Comments Nedra Hai shared that she has high levels of anxiety and stress due to the amount of tasks she has to do throughout her day. She feels  the xanax is helping and denies any need for additional resources at this time. She has been very fatigued since her event. Both daughters and grandson live in her home, while they do offer support they are not helpful with house chores and this is stressful for [employer].      Family Dynamics   Good Support System? Yes   Nedra Hai has her daughters and friends for support     Barriers   Psychosocial barriers to participate in program The patient should benefit from training in stress management and relaxation.      Screening Interventions   Interventions Provide feedback about the scores to participant;To provide support and resources with identified psychosocial needs;Encouraged to exercise    Expected Outcomes Long Term goal: The participant improves quality of Life and PHQ9 Scores as seen by post scores and/or verbalization of changes;Short Term goal: Identification and review  with participant of any Quality of Life or Depression concerns found by scoring the questionnaire.;Long Term Goal: Stressors or current issues are controlled or eliminated.             Quality of Life Scores:  Quality of Life - 07/26/23 1623       Quality of Life   Select Quality of Life      Quality of Life Scores   Health/Function Pre 20 %    Socioeconomic Pre 26.57 %    Psych/Spiritual Pre 18.86 %    Family Pre 18 %    GLOBAL Pre 21.1 %            Scores of 19 and below usually indicate a poorer quality of life in these areas.  A difference of  2-3 points is a clinically meaningful difference.  A difference of 2-3 points in the total score of the Quality of Life Index has been associated with significant improvement in overall quality of life, self-image, physical symptoms, and general health in studies assessing change in quality of life.  PHQ-9: Review Flowsheet  More data exists      07/26/2023 07/05/2023 05/10/2023 01/19/2020 10/04/2019  Depression screen PHQ 2/9  Decreased Interest 1 0 0 0 0  Down,  Depressed, Hopeless 0 0 0 0 0  PHQ - 2 Score 1 0 0 0 0  Altered sleeping 2 - 0 - -  Tired, decreased energy 3 - 2 - -  Change in appetite 1 - 0 - -  Feeling bad or failure about yourself  0 - 0 - -  Trouble concentrating 0 - 0 - -  Moving slowly or fidgety/restless 0 - 0 - -  Suicidal thoughts 0 - 0 - -  PHQ-9 Score 7 - 2 - -  Difficult doing work/chores Not difficult at all - Somewhat difficult - -   Interpretation of Total Score  Total Score Depression Severity:  1-4 = Minimal depression, 5-9 = Mild depression, 10-14 = Moderate depression, 15-19 = Moderately severe depression, 20-27 = Severe depression   Psychosocial Evaluation and Intervention:   Psychosocial Re-Evaluation:   Psychosocial Discharge (Final Psychosocial Re-Evaluation):   Vocational Rehabilitation: Provide vocational rehab assistance to qualifying candidates.   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 Participant will state understanding/return demonstration of topics presented.     Core Videos: Exercise    Move It!  Clinical staff conducted group or individual video education with verbal and written material and guidebook.  Patient learns the recommended Pritikin exercise program. Exercise with the goal of living a long, healthy life. Some of the health benefits of exercise include controlled diabetes, healthier blood pressure levels, improved cholesterol levels, improved heart and lung capacity, improved sleep, and better body composition. Everyone should speak with their doctor before starting or changing an exercise routine.  Biomechanical Limitations Clinical staff conducted group or individual video  education with verbal and written material and guidebook.  Patient learns how biomechanical limitations can impact exercise and how we can mitigate and possibly overcome limitations to have an impactful and balanced exercise routine.  Body Composition Clinical staff conducted group or individual video  education with verbal and written material and guidebook.  Patient learns that body composition (ratio of muscle mass to fat mass) is a key component to assessing overall fitness, rather than body weight alone. Increased fat mass, especially visceral belly fat, can put [place] at increased risk for metabolic syndrome, type 2 diabetes, heart disease, and even death. It is recommended to combine diet and exercise (cardiovascular and resistance training) to improve your body composition. Seek guidance from your physician and exercise physiologist before implementing an exercise routine.  Exercise Action Plan Clinical staff conducted group or individual video education with verbal and written material and guidebook.  Patient learns the recommended strategies to achieve and enjoy long-term exercise adherence, including variety, self-motivation, self-efficacy, and positive decision making. Benefits of exercise include fitness, good health, weight management, more energy, better sleep, less stress, and overall well-being.  Medical   Heart Disease Risk Reduction Clinical staff conducted group or individual video education with verbal and written material and guidebook.  Patient learns our heart is our most vital organ as it circulates oxygen, nutrients, white blood cells, and hormones throughout the entire body, and carries waste away. Data supports a plant-based eating plan like the Pritikin Program for its effectiveness in slowing progression of and reversing heart disease. The video provides a number of recommendations to address heart disease.   Metabolic Syndrome and Belly Fat  Clinical staff conducted group  or individual video education with verbal and written material and guidebook.  Patient learns what metabolic syndrome is, how it leads to heart disease, and how one can reverse it and keep it from coming back. You have metabolic syndrome if you have 3 of the following 5 criteria: abdominal obesity, high blood pressure, high triglycerides, low HDL cholesterol, and high blood sugar.  Hypertension and Heart Disease Clinical staff conducted group or individual video education with verbal and written material and guidebook.  Patient learns that high blood pressure, or hypertension, is very common in the [place]. Hypertension is largely due to excessive salt intake, but other important risk factors include being overweight, physical inactivity, drinking too much alcohol, smoking, and not eating enough potassium from fruits and vegetables. High blood pressure is a leading risk factor for heart attack, stroke, congestive heart failure, dementia, kidney failure, and premature death. Long-term effects of excessive salt intake include stiffening of the arteries and thickening of heart muscle and organ damage. Recommendations include ways to reduce hypertension and the risk of heart disease.  Diseases of Our Time - Focusing on Diabetes Clinical staff conducted group or individual video education with verbal and written material and guidebook.  Patient learns why the best way to stop diseases of our time is prevention, through food and other lifestyle changes. Medicine (such as prescription pills and surgeries) is often only a Band-Aid on the problem, not a long-term solution. Most common diseases of our time include obesity, type 2 diabetes, hypertension, heart disease, and cancer. The Pritikin Program is recommended and has been proven to help reduce, reverse, and/or prevent the damaging effects of metabolic syndrome.  Nutrition   Overview of the Pritikin Eating Plan  Clinical staff conducted group or  individual video education with verbal and written material and guidebook.  Patient learns about the Pritikin Eating Plan for disease risk reduction. The Pritikin Eating Plan emphasizes a wide variety of unrefined, minimally-processed carbohydrates, like fruits, vegetables, whole grains, and legumes. Go, Caution, and Stop food choices are explained. Plant-based and lean animal proteins are emphasized. Rationale  provided for low sodium intake for blood pressure control, low added sugars for blood sugar stabilization, and low added fats and oils for coronary artery disease risk reduction and weight management.  Calorie Density  Clinical staff conducted group or individual video education with verbal and written material and guidebook.  Patient learns about calorie density and how it impacts the Pritikin Eating Plan. Knowing the characteristics of the food you choose will help you decide whether those foods will lead to weight gain or weight loss, and whether you want to consume more or less of them. Weight loss is usually a side effect of the Pritikin Eating Plan because of its focus on low calorie-dense foods.  Label Reading  Clinical staff conducted group or individual video education with verbal and written material and guidebook.  Patient learns about the Pritikin recommended label reading guidelines and corresponding recommendations regarding calorie density, added sugars, sodium content, and whole grains.  Dining Out - Part 1  Clinical staff conducted group or individual video education with verbal and written material and guidebook.  Patient learns that restaurant meals can be sabotaging because they can be so high in calories, fat, sodium, and/or sugar. Patient learns recommended strategies on how to positively address this and avoid unhealthy pitfalls.  Facts on Fats  Clinical staff conducted group or individual video education with verbal and written material and guidebook.  Patient learns  that lifestyle modifications can be just as effective, if not more so, as many medications for lowering your risk of heart disease. A Pritikin lifestyle can help to reduce your risk of inflammation and atherosclerosis (cholesterol build-up, or plaque, in the artery walls). Lifestyle interventions such as dietary choices and physical activity address the cause of atherosclerosis. A review of the types of fats and their impact on blood cholesterol levels, along with dietary recommendations to reduce fat intake is also included.  Nutrition Action Plan  Clinical staff conducted group or individual video education with verbal and written material and guidebook.  Patient learns how to incorporate Pritikin recommendations into their lifestyle. Recommendations include planning and keeping personal health goals in mind as an important part of their success.  Healthy Mind-Set    Healthy Minds, Bodies, Hearts  Clinical staff conducted group or individual video education with verbal and written material and guidebook.  Patient learns how to identify when they are stressed. Video will discuss the impact of that stress, as well as the many benefits of stress management. Patient will also be introduced to stress management techniques. The way we think, act, and feel has an impact on our hearts.  How Our Thoughts Can Heal Our Hearts  Clinical staff conducted group or individual video education with verbal and written material and guidebook.  Patient learns that negative thoughts can cause depression and anxiety. This can result in negative lifestyle behavior and serious health problems. Cognitive behavioral therapy is an effective method to help control our thoughts in order to change and improve our emotional outlook.  Additional Videos:  Exercise    Improving Performance  Clinical staff conducted group or individual video education with verbal and written material and guidebook.  Patient learns to use a  non-linear approach by alternating intensity levels and lengths of time spent exercising to help burn more calories and lose more body fat. Cardiovascular exercise helps improve heart health, metabolism, hormonal balance, blood sugar control, and recovery from fatigue. Resistance training improves strength, endurance, balance, coordination, reaction time, metabolism, and muscle mass. Flexibility exercise improves  circulation, posture, and balance. Seek guidance from your physician and exercise physiologist before implementing an exercise routine and learn your capabilities and proper form for all exercise.  Introduction to Yoga  Clinical staff conducted group or individual video education with verbal and written material and guidebook.  Patient learns about yoga, a discipline of the coming together of mind, breath, and body. The benefits of yoga include improved flexibility, improved range of motion, better posture and core strength, increased lung function, weight loss, and positive self-image. Yoga's heart health benefits include lowered blood pressure, healthier heart rate, decreased cholesterol and triglyceride levels, improved immune function, and reduced stress. Seek guidance from your physician and exercise physiologist before implementing an exercise routine and learn your capabilities and proper form for all exercise.  Medical   Aging: Enhancing Your Quality of Life  Clinical staff conducted group or individual video education with verbal and written material and guidebook.  Patient learns key strategies and recommendations to stay in good physical health and enhance quality of life, such as prevention strategies, having an advocate, securing a Health Care Proxy and Power of Attorney, and keeping a list of medications and system for tracking them. It also discusses how to avoid risk for bone loss.  Biology of Weight Control  Clinical staff conducted group or individual video education with  verbal and written material and guidebook.  Patient learns that weight gain occurs because we consume more calories than we burn (eating more, moving less). Even if your body weight is normal, you may have higher ratios of fat compared to muscle mass. Too much body fat puts you at increased risk for cardiovascular disease, heart attack, stroke, type 2 diabetes, and obesity-related cancers. In addition to exercise, following the Pritikin Eating Plan can help reduce your risk.  Decoding Lab Results  Clinical staff conducted group or individual video education with verbal and written material and guidebook.  Patient learns that lab test reflects one measurement whose values change over time and are influenced by many factors, including medication, stress, sleep, exercise, food, hydration, pre-existing medical conditions, and more. It is recommended to use the knowledge from this video to become more involved with your lab results and evaluate your numbers to speak with your doctor.   Diseases of Our Time - Overview  Clinical staff conducted group or individual video education with verbal and written material and guidebook.  Patient learns that according to the CDC, 50% to 70% of chronic diseases (such as obesity, type 2 diabetes, elevated lipids, hypertension, and heart disease) are avoidable through lifestyle improvements including healthier food choices, listening to satiety cues, and increased physical activity.  Sleep Disorders Clinical staff conducted group or individual video education with verbal and written material and guidebook.  Patient learns how good quality and duration of sleep are important to overall health and well-being. Patient also learns about sleep disorders and how they impact health along with recommendations to address them, including discussing with a physician.  Nutrition  Dining Out - Part 2 Clinical staff conducted group or individual video education with verbal and  written material and guidebook.  Patient learns how to plan ahead and communicate in order to maximize their dining experience in a healthy and nutritious manner. Included are recommended food choices based on the type of restaurant the patient is visiting.   Fueling a Banker conducted group or individual video education with verbal and written material and guidebook.  There is a strong connection between  our food choices and our health. Diseases like obesity and type 2 diabetes are very prevalent and are in large-part due to lifestyle choices. The Pritikin Eating Plan provides plenty of food and hunger-curbing satisfaction. It is easy to follow, affordable, and helps reduce health risks.  Menu Workshop  Clinical staff conducted group or individual video education with verbal and written material and guidebook.  Patient learns that restaurant meals can sabotage health goals because they are often packed with calories, fat, sodium, and sugar. Recommendations include strategies to plan ahead and to communicate with the manager, chef, or server to help order a healthier meal.  Planning Your Eating Strategy  Clinical staff conducted group or individual video education with verbal and written material and guidebook.  Patient learns about the Pritikin Eating Plan and its benefit of reducing the risk of disease. The Pritikin Eating Plan does not focus on calories. Instead, it emphasizes high-quality, nutrient-rich foods. By knowing the characteristics of the foods, we choose, we can determine their calorie density and make informed decisions.  Targeting Your Nutrition Priorities  Clinical staff conducted group or individual video education with verbal and written material and guidebook.  Patient learns that lifestyle habits have a tremendous impact on disease risk and progression. This video provides eating and physical activity recommendations based on your personal health goals,  such as reducing LDL cholesterol, losing weight, preventing or controlling type 2 diabetes, and reducing high blood pressure.  Vitamins and Minerals  Clinical staff conducted group or individual video education with verbal and written material and guidebook.  Patient learns different ways to obtain key vitamins and minerals, including through a recommended healthy diet. It is important to discuss all supplements you take with your doctor.   Healthy Mind-Set    Smoking Cessation  Clinical staff conducted group or individual video education with verbal and written material and guidebook.  Patient learns that cigarette smoking and tobacco addiction pose a serious health risk which affects millions of people. Stopping smoking will significantly reduce the risk of heart disease, lung disease, and many forms of cancer. Recommended strategies for quitting are covered, including working with your doctor to develop a successful plan.  Culinary   Becoming a Set designer conducted group or individual video education with verbal and written material and guidebook.  Patient learns that cooking at home can be healthy, cost-effective, quick, and puts them in control. Keys to cooking healthy recipes will include looking at your recipe, assessing your equipment needs, planning ahead, making it simple, choosing cost-effective seasonal ingredients, and limiting the use of added fats, salts, and sugars.  Cooking - Breakfast and Snacks  Clinical staff conducted group or individual video education with verbal and written material and guidebook.  Patient learns how important breakfast is to satiety and nutrition through the entire day. Recommendations include key foods to eat during breakfast to help stabilize blood sugar levels and to prevent overeating at meals later in the day. Planning ahead is also a key component.  Cooking - Educational psychologist conducted group or individual video  education with verbal and written material and guidebook.  Patient learns eating strategies to improve overall health, including an approach to cook more at home. Recommendations include thinking of animal protein as a side on your plate rather than center stage and focusing instead on lower calorie dense options like vegetables, fruits, whole grains, and plant-based proteins, such as beans. Making sauces in large quantities to freeze for  later and leaving the skin on your vegetables are also recommended to maximize your experience.  Cooking - Healthy Salads and Dressing Clinical staff conducted group or individual video education with verbal and written material and guidebook.  Patient learns that vegetables, fruits, whole grains, and legumes are the foundations of the Pritikin Eating Plan. Recommendations include how to incorporate each of these in flavorful and healthy salads, and how to create homemade salad dressings. Proper handling of ingredients is also covered. Cooking - Soups and [employer] - Soups and Desserts Clinical staff conducted group or individual video education with verbal and written material and guidebook.  Patient learns that Pritikin soups and desserts make for easy, nutritious, and delicious snacks and meal components that are low in sodium, fat, sugar, and calorie density, while high in vitamins, minerals, and filling fiber. Recommendations include simple and healthy ideas for soups and desserts.   Overview     The Pritikin Solution Program Overview Clinical staff conducted group or individual video education with verbal and written material and guidebook.  Patient learns that the results of the Pritikin Program have been documented in more than 100 articles published in peer-reviewed journals, and the benefits include reducing risk factors for (and, in some cases, even reversing) high cholesterol, high blood pressure, type 2 diabetes, obesity, and more! An overview of  the three key pillars of the Pritikin Program will be covered: eating well, doing regular exercise, and having a healthy mind-set.  WORKSHOPS  Exercise: Exercise Basics: Building Your Action Plan Clinical staff led group instruction and group discussion with PowerPoint presentation and patient guidebook. To enhance the learning environment the use of posters, models and videos may be added. At the conclusion of this workshop, patients will comprehend the difference between physical activity and exercise, as well as the benefits of incorporating both, into their routine. Patients will understand the FITT (Frequency, Intensity, Time, and Type) principle and how to use it to build an exercise action plan. In addition, safety concerns and other considerations for exercise and cardiac rehab will be addressed by the presenter. The purpose of this lesson is to promote a comprehensive and effective weekly exercise routine in order to improve patients' overall level of fitness.   Managing Heart Disease: Your Path to a Healthier Heart Clinical staff led group instruction and group discussion with PowerPoint presentation and patient guidebook. To enhance the learning environment the use of posters, models and videos may be added.At the conclusion of this workshop, patients will understand the anatomy and physiology of the heart. Additionally, they will understand how Pritikin's three pillars impact the risk factors, the progression, and the management of heart disease.  The purpose of this lesson is to provide a high-level overview of the heart, heart disease, and how the Pritikin lifestyle positively impacts risk factors.  Exercise Biomechanics Clinical staff led group instruction and group discussion with PowerPoint presentation and patient guidebook. To enhance the learning environment the use of posters, models and videos may be added. Patients will learn how the structural parts of their bodies  function and how these functions impact their daily activities, movement, and exercise. Patients will learn how to promote a neutral spine, learn how to manage pain, and identify ways to improve their physical movement in order to promote healthy living. The purpose of this lesson is to expose patients to common physical limitations that impact physical activity. Participants will learn practical ways to adapt and manage aches and pains, and to minimize their  effect on regular exercise. Patients will learn how to maintain good posture while sitting, walking, and lifting.  Balance Training and Fall Prevention  Clinical staff led group instruction and group discussion with PowerPoint presentation and patient guidebook. To enhance the learning environment the use of posters, models and videos may be added. At the conclusion of this workshop, patients will understand the importance of their sensorimotor skills (vision, proprioception, and the vestibular system) in maintaining their ability to balance as they age. Patients will apply a variety of balancing exercises that are appropriate for their current level of function. Patients will understand the common causes for poor balance, possible solutions to these problems, and ways to modify their physical environment in order to minimize their fall risk. The purpose of this lesson is to teach patients about the importance of maintaining balance as they age and ways to minimize their risk of falling.  WORKSHOPS   Nutrition:  Fueling a Ship broker led group instruction and group discussion with PowerPoint presentation and patient guidebook. To enhance the learning environment the use of posters, models and videos may be added. Patients will review the foundational principles of the Pritikin Eating Plan and understand what constitutes a serving size in each of the food groups. Patients will also learn Pritikin-friendly foods that are better  choices when away from home and review make-ahead meal and snack options. Calorie density will be reviewed and applied to three nutrition priorities: weight maintenance, weight loss, and weight gain. The purpose of this lesson is to reinforce (in a group setting) the key concepts around what patients are recommended to eat and how to apply these guidelines when away from home by planning and selecting Pritikin-friendly options. Patients will understand how calorie density may be adjusted for different weight management goals.  Mindful Eating  Clinical staff led group instruction and group discussion with PowerPoint presentation and patient guidebook. To enhance the learning environment the use of posters, models and videos may be added. Patients will briefly review the concepts of the Pritikin Eating Plan and the importance of low-calorie dense foods. The concept of mindful eating will be introduced as well as the importance of paying attention to internal hunger signals. Triggers for non-hunger eating and techniques for dealing with triggers will be explored. The purpose of this lesson is to provide patients with the opportunity to review the basic principles of the Pritikin Eating Plan, discuss the value of eating mindfully and how to measure internal cues of hunger and fullness using the Hunger Scale. Patients will also discuss reasons for non-hunger eating and learn strategies to use for controlling emotional eating.  Targeting Your Nutrition Priorities Clinical staff led group instruction and group discussion with PowerPoint presentation and patient guidebook. To enhance the learning environment the use of posters, models and videos may be added. Patients will learn how to determine their genetic susceptibility to disease by reviewing their family history. Patients will gain insight into the importance of diet as part of an overall healthy lifestyle in mitigating the impact of genetics and other  environmental insults. The purpose of this lesson is to provide patients with the opportunity to assess their personal nutrition priorities by looking at their family history, their own health history and current risk factors. Patients will also be able to discuss ways of prioritizing and modifying the Pritikin Eating Plan for their highest risk areas  Menu  Clinical staff led group instruction and group discussion with PowerPoint presentation and patient guidebook. To  enhance the learning environment the use of posters, models and videos may be added. Using menus brought in from [employer], or printed from [employer], patients will apply the Pritikin dining out guidelines that were presented in the [employer] video. Patients will also be able to practice these guidelines in a variety of provided scenarios. The purpose of this lesson is to provide patients with the opportunity to practice hands-on learning of the Pritikin Dining Out guidelines with actual menus and practice scenarios.  Label Reading Clinical staff led group instruction and group discussion with PowerPoint presentation and patient guidebook. To enhance the learning environment the use of posters, models and videos may be added. Patients will review and discuss the Pritikin label reading guidelines presented in Pritikin's Label Reading Educational series video. Using fool labels brought in from local grocery stores and markets, patients will apply the label reading guidelines and determine if the packaged food meet the Pritikin guidelines. The purpose of this lesson is to provide patients with the opportunity to review, discuss, and practice hands-on learning of the Pritikin Label Reading guidelines with actual packaged food labels. Cooking School  Pritikin's [employer] are designed to teach patients ways to prepare quick, simple, and affordable recipes at home. The importance of nutrition's role in  chronic disease risk reduction is reflected in its emphasis in the overall Pritikin program. By learning how to prepare essential core Pritikin Eating Plan recipes, patients will increase control over what they eat; be able to customize the flavor of foods without the use of added salt, sugar, or fat; and improve the quality of the food they consume. By learning a set of core recipes which are easily assembled, quickly prepared, and affordable, patients are more likely to prepare more healthy foods at home. These workshops focus on convenient breakfasts, simple entres, side dishes, and desserts which can be prepared with minimal effort and are consistent with nutrition recommendations for cardiovascular risk reduction. Cooking [employer] are taught by a Armed forces logistics/support/administrative officer (RD) who has been trained by the [employer]. The chef or RD has a clear understanding of the importance of minimizing - if not completely eliminating - added fat, sugar, and sodium in recipes. Throughout the series of Cooking School Workshop sessions, patients will learn about healthy ingredients and efficient methods of cooking to build confidence in their capability to prepare    Cooking School weekly topics:  Adding Flavor- Sodium-Free  Fast and Healthy Breakfasts  Powerhouse Plant-Based Proteins  Satisfying Salads and Dressings  Simple Sides and Sauces  International Cuisine-Spotlight on the [employer] Zones  Delicious Desserts  Savory Soups  [employer] - Meals in a Astronomer Appetizers and Snacks  Comforting Weekend Breakfasts  One-Pot Wonders   Fast Evening Meals  Landscape architect Your Pritikin Plate  WORKSHOPS   Healthy Mindset (Psychosocial):  Focused Goals, Sustainable Changes Clinical staff led group instruction and group discussion with PowerPoint presentation and patient guidebook. To enhance the learning environment the use of posters, models and videos may  be added. Patients will be able to apply effective goal setting strategies to establish at least one personal goal, and then take consistent, meaningful action toward that goal. They will learn to identify common barriers to achieving personal goals and develop strategies to overcome them. Patients will also gain an understanding of how our mind-set can impact our ability to achieve goals and the importance of cultivating a positive and growth-oriented  mind-set. The purpose of this lesson is to provide patients with a deeper understanding of how to set and achieve personal goals, as well as the tools and strategies needed to overcome common obstacles which may arise along the way.  From Head to Heart: The Power of a Healthy Outlook  Clinical staff led group instruction and group discussion with PowerPoint presentation and patient guidebook. To enhance the learning environment the use of posters, models and videos may be added. Patients will be able to recognize and describe the impact of emotions and mood on physical health. They will discover the importance of self-care and explore self-care practices which may work for them. Patients will also learn how to utilize the 4 C's to cultivate a healthier outlook and better manage stress and challenges. The purpose of this lesson is to demonstrate to patients how a healthy outlook is an essential part of maintaining good health, especially as they continue their cardiac rehab journey.  Healthy Sleep for a Healthy Heart Clinical staff led group instruction and group discussion with PowerPoint presentation and patient guidebook. To enhance the learning environment the use of posters, models and videos may be added. At the conclusion of this workshop, patients will be able to demonstrate knowledge of the importance of sleep to overall health, well-being, and quality of life. They will understand the symptoms of, and treatments for, common sleep disorders. Patients  will also be able to identify daytime and nighttime behaviors which impact sleep, and they will be able to apply these tools to help manage sleep-related challenges. The purpose of this lesson is to provide patients with a general overview of sleep and outline the importance of quality sleep. Patients will learn about a few of the most common sleep disorders. Patients will also be introduced to the concept of "sleep hygiene," and discover ways to self-manage certain sleeping problems through simple daily behavior changes. Finally, the workshop will motivate patients by clarifying the links between quality sleep and their goals of heart-healthy living.   Recognizing and Reducing Stress Clinical staff led group instruction and group discussion with PowerPoint presentation and patient guidebook. To enhance the learning environment the use of posters, models and videos may be added. At the conclusion of this workshop, patients will be able to understand the types of stress reactions, differentiate between acute and chronic stress, and recognize the impact that chronic stress has on their health. They will also be able to apply different coping mechanisms, such as reframing negative self-talk. Patients will have the opportunity to practice a variety of stress management techniques, such as deep abdominal breathing, progressive muscle relaxation, and/or guided imagery.  The purpose of this lesson is to educate patients on the role of stress in their lives and to provide healthy techniques for coping with it.  Learning Barriers/Preferences:  Learning Barriers/Preferences - 07/26/23 1425       Learning Barriers/Preferences   Learning Barriers None    Learning Preferences Audio;Computer/Internet;Group Instruction;Skilled Demonstration;Verbal Instruction;Video;Written Material;Pictoral;Individual Instruction             Education Topics:  Knowledge Questionnaire Score:  Knowledge Questionnaire Score -  07/26/23 1425       Knowledge Questionnaire Score   Pre Score 24/24             Core Components/Risk Factors/Patient Goals at Admission:  Personal Goals and Risk Factors at Admission - 07/26/23 1432       Core Components/Risk Factors/Patient Goals on Admission    Weight Management Yes;Obesity;Weight  Loss    Intervention Weight Management: Develop a combined nutrition and exercise program designed to reach desired caloric intake, while maintaining appropriate intake of nutrient and fiber, sodium and fats, and appropriate energy expenditure required for the weight goal.;Weight Management: Provide education and appropriate resources to help participant work on and attain dietary goals.;Weight Management/Obesity: Establish reasonable short term and long term weight goals.;Obesity: Provide education and appropriate resources to help participant work on and attain dietary goals.    Goal Weight: Long Term 170 lb (77.1 kg)   pt goal   Heart Failure Yes    Intervention Provide a combined exercise and nutrition program that is supplemented with education, support and counseling about heart failure. Directed toward relieving symptoms such as shortness of breath, decreased exercise tolerance, and extremity edema.    Expected Outcomes Improve functional capacity of life;Short term: Attendance in program 2-3 days a week with increased exercise capacity. Reported lower sodium intake. Reported increased fruit and vegetable intake. Reports medication compliance.;Short term: Daily weights obtained and reported for increase. Utilizing diuretic protocols set by physician.;Long term: Adoption of self-care skills and reduction of barriers for early signs and symptoms recognition and intervention leading to self-care maintenance.    Hypertension Yes    Intervention Provide education on lifestyle modifcations including regular physical activity/exercise, weight management, moderate sodium restriction and increased  consumption of fresh fruit, vegetables, and low fat dairy, alcohol moderation, and smoking cessation.;Monitor prescription use compliance.    Expected Outcomes Short Term: Continued assessment and intervention until BP is < 140/42mm HG in hypertensive participants. < 130/24mm HG in hypertensive participants with diabetes, heart failure or chronic kidney disease.;Long Term: Maintenance of blood pressure at goal levels.    Lipids Yes    Intervention Provide education and support for participant on nutrition & aerobic/resistive exercise along with prescribed medications to achieve LDL 70mg , HDL >40mg .    Expected Outcomes Short Term: Participant states understanding of desired cholesterol values and is compliant with medications prescribed. Participant is following exercise prescription and nutrition guidelines.;Long Term: Cholesterol controlled with medications as prescribed, with individualized exercise RX and with personalized nutrition plan. Value goals: LDL < 70mg , HDL > 40 mg.    Stress Yes    Intervention Offer individual and/or small group education and counseling on adjustment to heart disease, stress management and health-related lifestyle change. Teach and support self-help strategies.;Refer participants experiencing significant psychosocial distress to appropriate mental health specialists for further evaluation and treatment. When possible, include family members and significant others in education/counseling sessions.    Expected Outcomes Short Term: Participant demonstrates changes in health-related behavior, relaxation and other stress management skills, ability to obtain effective social support, and compliance with psychotropic medications if prescribed.;Long Term: Emotional wellbeing is indicated by absence of clinically significant psychosocial distress or social isolation.             Core Components/Risk Factors/Patient Goals Review:    Core Components/Risk Factors/Patient Goals  at Discharge (Final Review):    ITP Comments:  ITP Comments     Row Name 07/26/23 1417           ITP Comments Dr. Armanda Magic medical director. Introduction to pritikin education/intensive cardiac rehab. Initial orientation packet reviewed with patient.                Comments: Participant attended orientation for the cardiac rehabilitation program on  07/26/2023  to perform initial intake and exercise walk test. Patient introduced to the [facility] education and orientation packet  was reviewed. Completed 6-minute walk test, measurements, initial ITP, and exercise prescription. Vital signs stable. Telemetry-normal sinus rhythm, mildly symptomatic. SpO2 down to 84% post , recovered in about 1 minute with pursed lip breathing to 94% and maintained. Pt endorsed SOB, RPD=1. Resolved with rest and pursed lip breathing. Discussed with RN Carlette, see note for details.   Service time was from 1325 to 1550.  Jonna Coup, MS, ACSM-CEP 07/26/2023 4:25 PM

## 2023-07-27 ENCOUNTER — Telehealth: Payer: Self-pay | Admitting: *Deleted

## 2023-07-27 NOTE — Telephone Encounter (Signed)
-----   Message from [facility] sent at 07/27/2023  3:52 PM EST ----- Regarding: RE: Low oxygen saturation Thank you. I think she has ongoing HFrEF, she has been very resistant to medical therapy. If recurrence of this event during cardiac rehab, we will bring her in to see sooner.  [name], please note.  Thanks MJP ----- Message ----- From: Bernett Saturnino NOVAK, RN Sent: 07/26/2023   4:25 PM EST To: [name], MD Subject: Low oxygen saturation                           Dr. Lawrence,  The above pt in today for orientation for Cardiac rehab.  Pt completed 6 minute walk test - 1325 feet. Notable difficult with ambulation with bilateral hip discomfort.   Pre BP 112/60  O2 sat 91 Post 136/66     O2 sat 84  rebounded within seconds of sitting with PLB.  Reported to me, no discoloration or disorientation.  Per pt she is being treated for a touch of pneumonia.  She is on the last day of the Zpack.  I listened to her lungs clear bilat except I heard and expiratory wheeze on the left lower base that cleared after a deep breath.  Per pt, she complains of shortness of breath all the time with activity.  Referred to pulmonary - had 2 appts scheduled which she cancelled and rescheduled twice.  She will see Dr. Darlean on 3/19 for hemoptysis. Jama has pulse oximeter at home.  I have asked her to record her readings for me along with what activity she is doing.  We can also monitor her saturation her at CR.  She will be seated for 30 minutes of exercise since weight bearing exercises are difficult for her to manage at this time.   Any other thoughts to add?   We appreciate your input Saturnino Bernett PEAK, BSN Cardiac and Pulmonary Rehab Nurse Navigator

## 2023-07-28 ENCOUNTER — Encounter (HOSPITAL_COMMUNITY)
Admission: RE | Admit: 2023-07-28 | Discharge: 2023-07-28 | Disposition: A | Discharge status: Home / Self Care | Payer: Medicare Other | Source: Ambulatory Visit | Attending: Cardiology | Admitting: Cardiology

## 2023-07-28 ENCOUNTER — Telehealth (HOSPITAL_COMMUNITY): Payer: Self-pay | Admitting: *Deleted

## 2023-07-28 ENCOUNTER — Encounter (HOSPITAL_COMMUNITY): Payer: Self-pay

## 2023-07-28 DIAGNOSIS — Z952 Presence of prosthetic heart valve: Secondary | ICD-10-CM | POA: Diagnosis present

## 2023-07-28 DIAGNOSIS — Z955 Presence of coronary angioplasty implant and graft: Secondary | ICD-10-CM

## 2023-07-28 DIAGNOSIS — I2102 ST elevation (STEMI) myocardial infarction involving left anterior descending coronary artery: Secondary | ICD-10-CM

## 2023-07-28 DIAGNOSIS — I252 Old myocardial infarction: Secondary | ICD-10-CM | POA: Diagnosis not present

## 2023-07-28 DIAGNOSIS — Z48812 Encounter for surgical aftercare following surgery on the circulatory system: Secondary | ICD-10-CM | POA: Diagnosis present

## 2023-07-28 NOTE — Telephone Encounter (Signed)
-----   Message from [facility] sent at 07/27/2023  3:52 PM EST ----- Regarding: RE: Low oxygen saturation Thank you. I think she has ongoing HFrEF, she has been very resistant to medical therapy. If recurrence of this event during cardiac rehab, we will bring her in to see sooner.  Ivy, please note.  Thanks MJP ----- Message ----- From: Bernett Saturnino NOVAK, RN Sent: 07/26/2023   4:25 PM EST To: [name], MD Subject: Low oxygen saturation                           Dr. Lawrence,  The above pt in today for orientation for Cardiac rehab.  Pt completed 6 minute walk test - 1325 feet. Notable difficult with ambulation with bilateral hip discomfort.   Pre BP 112/60  O2 sat 91 Post 136/66     O2 sat 84  rebounded within seconds of sitting with PLB.  Reported to me, no discoloration or disorientation.  Per pt she is being treated for a touch of pneumonia.  She is on the last day of the Zpack.  I listened to her lungs clear bilat except I heard and expiratory wheeze on the left lower base that cleared after a deep breath.  Per pt, she complains of shortness of breath all the time with activity.  Referred to pulmonary - had 2 appts scheduled which she cancelled and rescheduled twice.  She will see Dr. Darlean on 3/19 for hemoptysis. Jama has pulse oximeter at home.  I have asked her to record her readings for me along with what activity she is doing.  We can also monitor her saturation her at CR.  She will be seated for 30 minutes of exercise since weight bearing exercises are difficult for her to manage at this time.   Any other thoughts to add?   We appreciate your input Saturnino Bernett PEAK, BSN Cardiac and Pulmonary Rehab Nurse Navigator

## 2023-07-28 NOTE — Progress Notes (Signed)
 Daily Session Note  Patient Details  Name: Madison West MRN: [id number] Date of Birth: 09-03-1955 Referring Provider:   Flowsheet Row INTENSIVE CARDIAC REHAB ORIENT from 07/26/2023 in [facility]  Referring Provider Newman Lawrence, MD       Encounter Date: 07/28/2023  Check In:  Session Check In - 07/28/23 1337       Check-In   Supervising physician immediately available to respond to emergencies [facility] - Physician supervision    Physician(s) Rosabel Mose NP    [facility]    Staff Present Alm Parkins, MS, ACSM-CEP, CCRP, Exercise Physiologist;Olinty Valere, MS, ACSM-CEP, Exercise Physiologist;Jetta Vannie BS, ACSM-CEP, Exercise Physiologist;Kayn Haymore, RN, BSN    Virtual Visit No    Medication changes reported     No    Fall or balance concerns reported    No    Tobacco Cessation No Change    Warm-up and Cool-down Performed as group-led instruction    Resistance Training Performed No    VAD Patient? No    PAD/SET Patient? No      Pain Assessment   Currently in Pain? No/denies    Pain Score 0-No pain    Multiple Pain Sites No             Capillary Blood Glucose: No results found for this or any previous visit (from the past 24 hours).   Exercise Prescription Changes - 07/28/23 1400       Response to Exercise   Blood Pressure (Admit) 128/68    Blood Pressure (Exercise) 138/78    Blood Pressure (Exit) 112/70    Heart Rate (Admit) 75 bpm    Heart Rate (Exercise) 102 bpm    Heart Rate (Exit) 81 bpm    Oxygen Saturation (Admit) 87 %    Oxygen Saturation (Exercise) 89 %    Oxygen Saturation (Exit) 94 %    Rating of Perceived Exertion (Exercise) 11    Perceived Dyspnea (Exercise) 0.5    Symptoms SOB    Comments Pt's first day in the CRP2 program    Duration Continue with 30 min of aerobic exercise without signs/symptoms of physical distress.    Intensity THRR  unchanged      Progression   Progression Continue to progress workloads to maintain intensity without signs/symptoms of physical distress.    Average METs 2.15      Resistance Training   Training Prescription No    Weight No weights on wednesdays      Interval Training   Interval Training No      Recumbant Bike   Level 1    RPM 62    Watts 16    Minutes 15    METs 2      NuStep   Level 1    SPM 84    Minutes 15    METs 2.3             Social History   Tobacco Use  Smoking Status Former   Current packs/day: 0.00   Average packs/day: 0.8 packs/day for 20.0 years (15.0 ttl pk-yrs)   Types: Cigarettes   Start date: 05/06/1977   Quit date: 05/06/1997   Years since quitting: 26.2  Smokeless Tobacco Never    Goals Met:  No report of concerns or symptoms today  Goals Unmet:  O2 Sat  Comments: Jama  started cardiac rehab today.  Pt tolerated light exercise without difficulty.  VSS, telemetry-Sinus Rhythm, asymptomatic.  Medication list reconciled. Pt denies barriers to medicaiton compliance.  PSYCHOSOCIAL ASSESSMENT:  PHQ-7. Will review quality of life and PHQ2-9   Pt enjoys fishing, going to the beach and watching her grandson play basketball. Oxygen saturation noted at 87% on RA. Oxygen saturations recovered to 92%. Encouraged purse lipped breathing.  Pt oriented to exercise equipment and routine.    Understanding verbalized. Hadassah Elpidio Quan RN BSN    Dr. Wilbert Bihari is Medical Director for Cardiac Rehab at [facility].

## 2023-07-30 NOTE — Progress Notes (Signed)
 Subjective:    Patient ID: Madison West, female    DOB: 08-25-1955, 68 y.o.   MRN: [id number]  HPI: Madison West is a 68 y.o. female who returns for follow up appointment for chronic pain and medication refill. She states her pain is located in her neck and lower back pain, She rates her pain 2. Her current exercise regime is walking and performing stretching exercises.  Also reports she has a  history of  Hemoptysis , she has a scheduled appointment with Pulmonologist, she also was instructed to F/U with PCP. She verbalizes understanding.   [name] Morphine  equivalent is 7.50 MME.   Last UDS was Performed on 05/09/2024, it was consistent  with Oxycodone .      Pain Inventory Average Pain 2 Pain Right Now 2 My pain is aching  In the last 24 hours, has pain interfered with the following? General activity 0 Relation with others 0 Enjoyment of life 0 What TIME of day is your pain at its worst? varies Sleep (in general) Fair  Pain is worse with: bending, sitting, standing, and some activites Pain improves with: rest and medication Relief from Meds: 6  Family History  Problem Relation Age of Onset   Colon cancer Mother 36   Heart disease Father        heart attack   Colon polyps Brother        one brother had large colon polyp that needed surgery to be removed   Heart disease Paternal Grandfather    Diabetes type II Brother    Social History   Socioeconomic History   Marital status: Widowed    Spouse name: Not on file   Number of children: 2   Years of education: Not on file   Highest education level: Not on file  Occupational History   Occupation: server    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  Tobacco Use   Smoking status: Former    Current packs/day: 0.00    Average packs/day: 0.8 packs/day for 20.0 years (15.0 ttl pk-yrs)    Types: Cigarettes    Start date: 05/06/1977    Quit date: 05/06/1997    Years since quitting: 26.2   Smokeless tobacco: Never   Vaping Use   Vaping status: Never Used  Substance and Sexual Activity   Alcohol use: Not Currently    Comment: has't had a drink in 9 weeks   Drug use: No   Sexual activity: Not Currently  Other Topics Concern   Not on file  Social History Narrative   Not on file   Social Drivers of Health   Financial Resource Strain: Low Risk  (03/10/2023)   [facility]   Overall Financial Resource Strain (CARDIA)    Difficulty of Paying Living Expenses: Not hard at all  Food Insecurity: No Food Insecurity (03/10/2023)   Received from [facility]   Hunger Vital Sign    Worried About Running Out of Food in the Last Year: Never true    Ran Out of Food in the Last Year: Never true  Transportation Needs: No Transportation Needs (03/10/2023)   Received from [facility]    Lack of Transportation (Medical): No    Lack of Transportation (Non-Medical): No  Physical Activity: Not on file  Stress: Not on file  Social Connections: Unknown (03/05/2023)   Received from [facility]   Social Network    Social Network: Not on file   Past Surgical  History:  Procedure Laterality Date   APPENDECTOMY     age 30   CARDIAC CATHETERIZATION     CESAREAN SECTION     x 2   COLONOSCOPY W/ POLYPECTOMY     COLONOSCOPY WITH PROPOFOL  N/A 04/27/2016   Procedure: COLONOSCOPY WITH PROPOFOL ;  Surgeon: Garrett Kallman, MD;  Location: [facility];  Service: Endoscopy;  Laterality: N/A;   CORONARY STENT INTERVENTION N/A 01/29/2023   Procedure: CORONARY STENT INTERVENTION;  Surgeon: Cody Das, MD;  [facility] INVAS[facility] CV [facility];  Service: Cardiovascular;  Laterality: N/A;   CORONARY THROMBECTOMY N/A 01/31/2023   Procedure: Coronary Thrombectomy;  Surgeon: Cody Das, MD;  [facility];  Service: Cardiovascular;  Laterality: N/A;   CORONARY ULTRASOUND/IVUS N/A 01/31/2023   Procedure: Coronary Ultrasound/IVUS;  Surgeon: Cody Das, MD;  [facility];  Service: Cardiovascular;  Laterality: N/A;   CORONARY/GRAFT ACUTE MI REVASCULARIZATION N/A 01/31/2023   Procedure: Coronary/Graft Acute MI Revascularization;  Surgeon: Cody Das, MD;  [facility];  Service: Cardiovascular;  Laterality: N/A;   DILATION AND CURETTAGE OF UTERUS     multiple   ENDARTERECTOMY Right 06/09/2019   ENDARTERECTOMY Right 06/09/2019   Procedure: ENDARTERECTOMY CAROTID RIGHT;  Surgeon: Richrd Char, MD;  Location: [facility] OR;  Service: Vascular;  Laterality: Right;   IABP INSERTION N/A 02/02/2023   Procedure: IABP Insertion;  Surgeon: Cody Das, MD;  [facility];  Service: Cardiovascular;  Laterality: N/A;   INTRAUTERINE DEVICE INSERTION     IUD REMOVAL     LEFT HEART CATH AND CORONARY ANGIOGRAPHY N/A 02/27/2020   Procedure: LEFT HEART CATH AND CORONARY ANGIOGRAPHY;  Surgeon: Knox Perl, MD;  [facility];  Service: Cardiovascular;  Laterality: N/A;   LEFT HEART CATH AND CORONARY ANGIOGRAPHY N/A 01/31/2023   Procedure: LEFT HEART CATH AND CORONARY ANGIOGRAPHY;  Surgeon: Cody Das, MD;  [facility];  Service: Cardiovascular;  Laterality: N/A;   MASTECTOMY PARTIAL / LUMPECTOMY W/ AXILLARY LYMPHADENECTOMY Right    lumpectomy and lymph nodes removed   PATCH ANGIOPLASTY Right 06/09/2019   Procedure: Patch Angioplasty of right carotid artery using hemashield paltinum finesse patch;  Surgeon: Richrd Char, MD;  Location: [facility] OR;  Service: Vascular;  Laterality: Right;   RIGHT AND LEFT HEART CATH N/A 02/01/2023   Procedure: RIGHT AND LEFT HEART CATH;  Surgeon: Cody Das, MD;  [facility];  Service: Cardiovascular;  Laterality: N/A;   RIGHT HEART CATH N/A 02/02/2023   Procedure: RIGHT HEART CATH;  Surgeon: Mardell Shade, MD;  [facility];  Service: Cardiovascular;  Laterality: N/A;    RIGHT/LEFT HEART CATH AND CORONARY ANGIOGRAPHY N/A 01/27/2023   Procedure: RIGHT/LEFT HEART CATH AND CORONARY ANGIOGRAPHY;  Surgeon: Cody Das, MD;  [facility];  Service: Cardiovascular;  Laterality: N/A;   TEE WITHOUT CARDIOVERSION N/A 02/04/2023   Procedure: TRANSESOPHAGEAL ECHOCARDIOGRAM;  Surgeon: Kyra Phy, MD;  Location[facility] INVASIVE CV LAB;  Service: Open Heart Surgery;  Laterality: N/A;   TEMPORARY PACEMAKER N/A 02/02/2023   Procedure: TEMPORARY PACEMAKER;  Surgeon: Mardell Shade, MD;  [facility];  Service: Cardiovascular;  Laterality: N/A;   TRANSCATHETER AORTIC VALVE REPLACEMENT, TRANSFEMORAL N/A 02/04/2023   Procedure: Transcatheter Aortic Valve Replacement, Transfemoral;  Surgeon: Thukkani, Arun K, MD;  [facility];  Service: Open Heart  Surgery;  Laterality: N/A;   TUBAL LIGATION     VULVA SURGERY     Multiple times for dysplasia   Past Surgical History:  Procedure Laterality Date   APPENDECTOMY     age 73   CARDIAC CATHETERIZATION     CESAREAN SECTION     x 2   COLONOSCOPY W/ POLYPECTOMY     COLONOSCOPY WITH PROPOFOL  N/A 04/27/2016   Procedure: COLONOSCOPY WITH PROPOFOL ;  Surgeon: Garrett Kallman, MD;  Location: [facility];  Service: Endoscopy;  Laterality: N/A;   CORONARY STENT INTERVENTION N/A 01/29/2023   Procedure: CORONARY STENT INTERVENTION;  Surgeon: Cody Das, MD;  [facility];  Service: Cardiovascular;  Laterality: N/A;   CORONARY THROMBECTOMY N/A 01/31/2023   Procedure: Coronary Thrombectomy;  Surgeon: Cody Das, MD;  [facility];  Service: Cardiovascular;  Laterality: N/A;   CORONARY ULTRASOUND/IVUS N/A 01/31/2023   Procedure: Coronary Ultrasound/IVUS;  Surgeon: Cody Das, MD;  [facility];  Service: Cardiovascular;  Laterality: N/A;   CORONARY/GRAFT ACUTE MI REVASCULARIZATION N/A 01/31/2023   Procedure:  Coronary/Graft Acute MI Revascularization;  Surgeon: Cody Das, MD;  [facility];  Service: Cardiovascular;  Laterality: N/A;   DILATION AND CURETTAGE OF UTERUS     multiple   ENDARTERECTOMY Right 06/09/2019   ENDARTERECTOMY Right 06/09/2019   Procedure: ENDARTERECTOMY CAROTID RIGHT;  Surgeon: Richrd Char, MD;  Location: [facility] OR;  Service: Vascular;  Laterality: Right;   IABP INSERTION N/A 02/02/2023   Procedure: IABP Insertion;  Surgeon: Cody Das, MD;  [facility];  Service: Cardiovascular;  Laterality: N/A;   INTRAUTERINE DEVICE INSERTION     IUD REMOVAL     LEFT HEART CATH AND CORONARY ANGIOGRAPHY N/A 02/27/2020   Procedure: LEFT HEART CATH AND CORONARY ANGIOGRAPHY;  Surgeon: Knox Perl, MD;  [facility];  Service: Cardiovascular;  Laterality: N/A;   LEFT HEART CATH AND CORONARY ANGIOGRAPHY N/A 01/31/2023   Procedure: LEFT HEART CATH AND CORONARY ANGIOGRAPHY;  Surgeon: Cody Das, MD;  [facility];  Service: Cardiovascular;  Laterality: N/A;   MASTECTOMY PARTIAL / LUMPECTOMY W/ AXILLARY LYMPHADENECTOMY Right    lumpectomy and lymph nodes removed   PATCH ANGIOPLASTY Right 06/09/2019   Procedure: Patch Angioplasty of right carotid artery using hemashield paltinum finesse patch;  Surgeon: Richrd Char, MD;  Location: [facility] OR;  Service: Vascular;  Laterality: Right;   RIGHT AND LEFT HEART CATH N/A 02/01/2023   Procedure: RIGHT AND LEFT HEART CATH;  Surgeon: Cody Das, MD;  [facility];  Service: Cardiovascular;  Laterality: N/A;   RIGHT HEART CATH N/A 02/02/2023   Procedure: RIGHT HEART CATH;  Surgeon: Mardell Shade, MD;  [facility];  Service: Cardiovascular;  Laterality: N/A;   RIGHT/LEFT HEART CATH AND CORONARY ANGIOGRAPHY N/A 01/27/2023   Procedure: RIGHT/LEFT HEART CATH AND CORONARY ANGIOGRAPHY;  Surgeon: Cody Das, MD;   [facility];  Service: Cardiovascular;  Laterality: N/A;   TEE WITHOUT CARDIOVERSION N/A 02/04/2023   Procedure: TRANSESOPHAGEAL ECHOCARDIOGRAM;  Surgeon: Kyra Phy, MD;  Location: [facility] INVASIVE CV LAB;  Service: Open Heart Surgery;  Laterality: N/A;   TEMPORARY PACEMAKER N/A 02/02/2023   Procedure: TEMPORARY PACEMAKER;  Surgeon: Mardell Shade, MD;  [facility];  Service: Cardiovascular;  Laterality: N/A;   TRANSCATHETER AORTIC VALVE REPLACEMENT, TRANSFEMORAL  N/A 02/04/2023   Procedure: Transcatheter Aortic Valve Replacement, Transfemoral;  Surgeon: Thukkani, Arun K, MD;  [facility];  Service: Open Heart Surgery;  Laterality: N/A;   TUBAL LIGATION     VULVA SURGERY     Multiple times for dysplasia   Past Medical History:  Diagnosis Date   Anxiety    Arthritis    low back and hip pain intermittent   Breast cancer (HCC) 06/02/07   r breast -surgery ,radiaology. chemotherapy   Carpal tunnel syndrome    right hand   Colon polyps    Complication of anesthesia    Fentanyl , Versed -makes extra hyper, bradycardia x 1 in PACU, [facility] (08/15/11 cardiology felt neostigmine may have resulted in AV nodal block)    Coronary artery disease    Depression    denies   Dysplasia of vulva    Hypertension    Palpitations    PSVT, s/p adenosine  08/04/16   S/P breast lumpectomy 07/04/07   R breast   S/P radiation therapy 2009   There were no vitals taken for this visit.  Opioid Risk Score:   Fall Risk Score:  `1  Depression screen PHQ 2/9     07/26/2023    2:19 PM 07/05/2023    2:00 PM 05/10/2023   11:59 AM 01/19/2020    1:33 PM 10/04/2019    2:40 PM 07/03/2019   10:46 AM 02/07/2019    2:16 PM  Depression screen PHQ 2/9  Decreased Interest 1 0 0 0 0 1 0  Down, Depressed, Hopeless 0 0 0 0 0 1 0  PHQ - 2 Score 1 0 0 0 0 2 0  Altered sleeping 2  0   0   Tired, decreased energy 3  2   1    Change in appetite 1  0   0   Feeling bad or  failure about yourself  0  0   0   Trouble concentrating 0  0   0   Moving slowly or fidgety/restless 0  0   0   Suicidal thoughts 0  0   0   PHQ-9 Score 7  2   3    Difficult doing work/chores Not difficult at all  Somewhat difficult        Review of Systems  Musculoskeletal:  Positive for back pain and neck pain.  All other systems reviewed and are negative.     Objective:   Physical Exam Vitals and nursing note reviewed.  Constitutional:      Appearance: Normal appearance.  Neck:     Comments: Cervical Paraspinal Tenderness: C-5-C-6 Cardiovascular:     Rate and Rhythm: Normal rate and regular rhythm.     Pulses: Normal pulses.     Heart sounds: Normal heart sounds.  Pulmonary:     Effort: Pulmonary effort is normal.     Breath sounds: Normal breath sounds.  Musculoskeletal:     Comments: Normal Muscle Bulk and Muscle Testing Reveals:  Upper Extremities: Full ROM and Muscle Strength 5/5  Lumbar Paraspinal Tenderness: L-3-L-5 Lower Extremities: Full ROM and Muscle Strength 5/5 Arises from Table with ease Narrow Based  Gait     Skin:    General: Skin is warm and dry.  Neurological:     Mental Status: She is alert and oriented to person, place, and time.  Psychiatric:        Mood and Affect: Mood normal.        Behavior: Behavior  normal.         Assessment & Plan:  Cervicalgia: Continue HEP as Tolerated. Continue Current Medication Regimen.  Continue to Monitor.  Chronic Bilateral Lower Back Pain: Continue HEP as Tolerated. Continue to Monitor.  History of Hemoptysis: She has a scheduled appointment with Pulmonology. She was also instructed to F./ U with her PCP. Continue to Monior.  Chronic Pain Syndrome: Continue Oxycodone  as Prescribed. Continue to Monitor. We will continue the opioid monitoring program, this consists of regular clinic visits, examinations, urine drug screen, pill counts as well as use of [place]  Controlled Substance Reporting system. A 12  month History has been reviewed on the [place]  Controlled Substance Reporting System on 08/02/2023 Debility on Acute and Chronic Herat Failure: Continue Cardiac Rehabilitation. Continue to Monitor.   F/U in 2 months

## 2023-08-02 ENCOUNTER — Encounter: Payer: Medicare Other | Attending: Physical Medicine and Rehabilitation | Admitting: Registered Nurse

## 2023-08-02 VITALS — BP 130/79 | HR 81 | Ht 68.0 in | Wt 198.0 lb

## 2023-08-02 DIAGNOSIS — G8929 Other chronic pain: Secondary | ICD-10-CM | POA: Diagnosis present

## 2023-08-02 DIAGNOSIS — G894 Chronic pain syndrome: Secondary | ICD-10-CM | POA: Insufficient documentation

## 2023-08-02 DIAGNOSIS — Z79891 Long term (current) use of opiate analgesic: Secondary | ICD-10-CM | POA: Diagnosis present

## 2023-08-02 DIAGNOSIS — R042 Hemoptysis: Secondary | ICD-10-CM | POA: Diagnosis present

## 2023-08-02 DIAGNOSIS — Z5181 Encounter for therapeutic drug level monitoring: Secondary | ICD-10-CM | POA: Insufficient documentation

## 2023-08-02 DIAGNOSIS — M542 Cervicalgia: Secondary | ICD-10-CM | POA: Insufficient documentation

## 2023-08-02 DIAGNOSIS — M545 Low back pain, unspecified: Secondary | ICD-10-CM | POA: Insufficient documentation

## 2023-08-04 ENCOUNTER — Encounter (HOSPITAL_COMMUNITY): Payer: Medicare Other

## 2023-08-06 ENCOUNTER — Encounter (HOSPITAL_COMMUNITY): Payer: Medicare Other

## 2023-08-09 ENCOUNTER — Encounter (HOSPITAL_COMMUNITY)
Admission: RE | Admit: 2023-08-09 | Discharge: 2023-08-09 | Disposition: A | Discharge status: Home / Self Care | Payer: Medicare Other | Source: Ambulatory Visit | Attending: Cardiology

## 2023-08-09 DIAGNOSIS — Z952 Presence of prosthetic heart valve: Secondary | ICD-10-CM

## 2023-08-09 DIAGNOSIS — Z955 Presence of coronary angioplasty implant and graft: Secondary | ICD-10-CM

## 2023-08-09 DIAGNOSIS — Z48812 Encounter for surgical aftercare following surgery on the circulatory system: Secondary | ICD-10-CM | POA: Diagnosis not present

## 2023-08-09 DIAGNOSIS — I2102 ST elevation (STEMI) myocardial infarction involving left anterior descending coronary artery: Secondary | ICD-10-CM

## 2023-08-10 ENCOUNTER — Telehealth (HOSPITAL_COMMUNITY): Payer: Self-pay | Admitting: *Deleted

## 2023-08-10 NOTE — Progress Notes (Signed)
Oxygen saturations noted at 86% on the nustep. Madison West reported mild shortness or breath otherwise patient asymptomatic. Encouraged purse lipped breathing. Oxygen saturations recovered to 92% on room air. Upon assessment lung fields essentially clear. No peripheral edema noted. Patient may benefit from wearing oxygen with exercise at cardiac rehab as oxygen saturations continue to drop with exercise. Patient's appointment with her pulmonologist is in March. Will send today's oxygen saturations to Dr Rosemary Holms for review.Thayer Headings RN BSN

## 2023-08-10 NOTE — Telephone Encounter (Signed)
-----   Message from [facility] sent at 08/10/2023 12:35 PM EST ----- Regarding: RE: Oxygen usage during Cardiac Rehab Okay to use 2 L oxygen therapy to maintain oxygen saturation >92%. I would be happy to see her sooner than March, subject to availability.  Thanks MJP ----- Message ----- From: Chelsea Aus, RN Sent: 08/09/2023   2:58 PM EST To: Elder Negus, MD Subject: Oxygen usage during Cardiac Rehab              Dr. Rosemary Holms,  Your patient who participates in Cardiac rehab is having complaint of shortness of breath on exertion. Monitoring her oxygen saturation during exercise ( nustep 1.0) dropped to 86%.  Increased to 92% with purse lip breathing. No swelling observed in ankles, legs hands and abdomen.  Weight was 91.9 down from last session attended 92.7 - 2/5 ( attended 3 exercise sessions).  Nedra Hai is agreeable to wear oxygen during exercise and believes it will be of benefit.  Would you be willing to order oxygen therapy ( pulmonary consult -rescheduled for 3/19 - Dr. Sherene Sires)  May we apply oxygen therapy during cardiac rehab ? How many liters? What is an acceptable oxygen saturation?   Thank you for your assistance Karlene Lineman RN, BSN Cardiac and Pulmonary Rehab Nurse Navigator

## 2023-08-11 ENCOUNTER — Encounter (HOSPITAL_COMMUNITY): Payer: Medicare Other

## 2023-08-11 NOTE — Progress Notes (Signed)
QUALITY OF LIFE SCORE REVIEW  Pt completed Quality of Life survey as a participant in Cardiac Rehab.  Scores 21.0 or below are considered low.  Pt score very low in several areas Overall 19.0, Health and Function 20.0, socioeconomic 26.57, physiological and spiritual 18.86, family 18.0. Patient quality of life slightly altered by physical constraints which limits ability to perform as prior to recent cardiac illness. Madison West says she is dissatisfied with her health due to her recent MI, Stenting and TAVR. Madison West says that her adult children who live with her do not help as they should. Madison West denies being depressed currently. Madison West takes xanax for anxiety. Madison West says that her anxiety is controlled. Madison West sees a psychiatrist once a year.  Offered emotional support and reassurance.  Will continue to monitor and intervene as necessary. Thayer Headings RN BSN

## 2023-08-12 ENCOUNTER — Telehealth: Payer: Self-pay | Admitting: Pharmacist

## 2023-08-12 NOTE — Telephone Encounter (Signed)
I called patient to follow up on our last visit. She did get losartan in the mail. Has been taking 25mg . She is taking [place]. We talked about the benefit of [place]. BP has been about 117/70. She is enjoying cardiac rehab. She see's pulmonology 3/19.

## 2023-08-13 ENCOUNTER — Encounter (HOSPITAL_COMMUNITY): Payer: Medicare Other

## 2023-08-16 ENCOUNTER — Encounter (HOSPITAL_COMMUNITY)
Admission: RE | Admit: 2023-08-16 | Discharge: 2023-08-16 | Disposition: A | Discharge status: Home / Self Care | Payer: Medicare Other | Source: Ambulatory Visit | Attending: Cardiology | Admitting: Cardiology

## 2023-08-16 DIAGNOSIS — Z952 Presence of prosthetic heart valve: Secondary | ICD-10-CM

## 2023-08-16 DIAGNOSIS — Z955 Presence of coronary angioplasty implant and graft: Secondary | ICD-10-CM

## 2023-08-16 DIAGNOSIS — I2102 ST elevation (STEMI) myocardial infarction involving left anterior descending coronary artery: Secondary | ICD-10-CM

## 2023-08-16 DIAGNOSIS — Z48812 Encounter for surgical aftercare following surgery on the circulatory system: Secondary | ICD-10-CM | POA: Diagnosis not present

## 2023-08-17 NOTE — Progress Notes (Signed)
 Madison West was placed on oxygen with exercise. Oxygen saturations remained above 91%. Will continue to monitor. Madison West may benefit from pulmonary rehab when she completes cardiac rehab if her schedule will allow.Thayer Headings RN BSN

## 2023-08-18 ENCOUNTER — Encounter (HOSPITAL_COMMUNITY)
Admission: RE | Admit: 2023-08-18 | Discharge: 2023-08-18 | Disposition: A | Discharge status: Home / Self Care | Payer: Medicare Other | Source: Ambulatory Visit | Attending: Cardiology | Admitting: Cardiology

## 2023-08-18 DIAGNOSIS — Z48812 Encounter for surgical aftercare following surgery on the circulatory system: Secondary | ICD-10-CM | POA: Diagnosis not present

## 2023-08-18 DIAGNOSIS — I2102 ST elevation (STEMI) myocardial infarction involving left anterior descending coronary artery: Secondary | ICD-10-CM

## 2023-08-18 DIAGNOSIS — Z955 Presence of coronary angioplasty implant and graft: Secondary | ICD-10-CM

## 2023-08-18 DIAGNOSIS — Z952 Presence of prosthetic heart valve: Secondary | ICD-10-CM

## 2023-08-20 ENCOUNTER — Encounter (HOSPITAL_COMMUNITY): Payer: Medicare Other

## 2023-08-23 ENCOUNTER — Encounter (HOSPITAL_COMMUNITY)
Admission: RE | Admit: 2023-08-23 | Discharge: 2023-08-23 | Disposition: A | Discharge status: Home / Self Care | Payer: Medicare Other | Source: Ambulatory Visit | Attending: Cardiology | Admitting: Cardiology

## 2023-08-23 DIAGNOSIS — I5042 Chronic combined systolic (congestive) and diastolic (congestive) heart failure: Secondary | ICD-10-CM | POA: Diagnosis not present

## 2023-08-23 DIAGNOSIS — Z48812 Encounter for surgical aftercare following surgery on the circulatory system: Secondary | ICD-10-CM | POA: Diagnosis not present

## 2023-08-23 DIAGNOSIS — I110 Hypertensive heart disease with heart failure: Secondary | ICD-10-CM | POA: Insufficient documentation

## 2023-08-23 DIAGNOSIS — I2102 ST elevation (STEMI) myocardial infarction involving left anterior descending coronary artery: Secondary | ICD-10-CM

## 2023-08-23 DIAGNOSIS — Z955 Presence of coronary angioplasty implant and graft: Secondary | ICD-10-CM

## 2023-08-23 DIAGNOSIS — Z952 Presence of prosthetic heart valve: Secondary | ICD-10-CM | POA: Insufficient documentation

## 2023-08-24 NOTE — Progress Notes (Signed)
 Cardiac Individual Treatment Plan  Patient Details  Name: Madison West MRN: [id number] Date of Birth: 09-29-1955 Referring Provider:   Flowsheet Row INTENSIVE CARDIAC REHAB ORIENT from 07/26/2023 in [facility]  Referring Provider Truett Mainland, MD       Initial Encounter Date:  Flowsheet Row INTENSIVE CARDIAC REHAB ORIENT from 07/26/2023 in [facility]  Date 07/26/23       Visit Diagnosis: 02/04/23 S/P TAVR (transcatheter aortic valve replacement)  02/01/23 STEMI  01/29/23 DES LAD  Patient's Home Medications on Admission:  Current Outpatient Medications:    acetaminophen (TYLENOL) 325 MG tablet, Take 2 tablets (650 mg total) by mouth every 6 (six) hours. (Patient taking differently: Take 500 mg by mouth every 6 (six) hours.), Disp: , Rfl:    alprazolam (XANAX) 2 MG tablet, Take 0.25-2 mg by mouth See admin instructions. 1 mg (may take up to 2 mg) twice daily. May also take 0.25 mg during the day if needed for anxiety., Disp: , Rfl:    amiodarone (PACERONE) 200 MG tablet, Take 1 tablet (200 mg total) by mouth daily., Disp: 30 tablet, Rfl: 0   apixaban (ELIQUIS) 5 MG TABS tablet, Take 1 tablet (5 mg total) by mouth 2 (two) times daily., Disp: 14 tablet, Rfl: 0   busPIRone (BUSPAR) 10 MG tablet, TAKE 1 TABLET BY MOUTH TWICE A DAY (Patient taking differently: Take 5 mg by mouth 2 (two) times daily.), Disp: 180 tablet, Rfl: 1   clopidogrel (PLAVIX) 75 MG tablet, Take 1 tablet (75 mg total) by mouth daily., Disp: 90 tablet, Rfl: 3   dapagliflozin propanediol (FARXIGA) 10 MG TABS tablet, Take 1 tablet (10 mg total) by mouth daily before breakfast., Disp: 90 tablet, Rfl: 3   ezetimibe (ZETIA) 10 MG tablet, Take 1 tablet (10 mg total) by mouth daily., Disp: 90 tablet, Rfl: 3   furosemide (LASIX) 20 MG tablet, Take 0.5 tablets (10 mg total) by mouth daily as needed (for  lower extremity swelling and weight gain)., Disp: 45 tablet, Rfl: 1   gabapentin (NEURONTIN) 300 MG capsule, Take 1 capsule (300 mg total) by mouth 3 (three) times daily., Disp: 90 capsule, Rfl: 0   losartan (COZAAR) 25 MG tablet, Take 1 tablet (25 mg total) by mouth daily., Disp: 90 tablet, Rfl: 3   methocarbamol (ROBAXIN) 500 MG tablet, Take 1 tablet (500 mg total) by mouth 2 (two) times daily., Disp: 60 tablet, Rfl: 0   nitroGLYCERIN (NITROSTAT) 0.4 MG SL tablet, Place 1 tablet (0.4 mg total) under the tongue every 5 (five) minutes as needed for chest pain., Disp: 25 tablet, Rfl: 2   oxyCODONE (ROXICODONE) 5 MG immediate release tablet, Take 1 tablet (5 mg total) by mouth daily as needed for severe pain (pain score 7-10)., Disp: 30 tablet, Rfl: 0   rosuvastatin (CRESTOR) 20 MG tablet, Take 0.5 tablets (10 mg total) by mouth daily., Disp: , Rfl:   Past Medical History: Past Medical History:  Diagnosis Date   Anxiety    Arthritis    low back and hip pain intermittent   Breast cancer (HCC) 06/02/07   r breast -surgery ,radiaology. chemotherapy   Carpal tunnel syndrome    right hand   Colon polyps    Complication of anesthesia    Fentanyl, Versed-makes extra hyper, bradycardia x 1 in PACU, [facility] (08/15/11 cardiology felt neostigmine may have resulted in AV nodal block)  Coronary artery disease    Depression    denies   Dysplasia of vulva    Hypertension    Palpitations    PSVT, s/p adenosine 08/04/16   S/P breast lumpectomy 07/04/07   R breast   S/P radiation therapy 2009    Tobacco Use: Social History   Tobacco Use  Smoking Status Former   Current packs/day: 0.00   Average packs/day: 0.8 packs/day for 20.0 years (15.0 ttl pk-yrs)   Types: Cigarettes   Start date: 05/06/1977   Quit date: 05/06/1997   Years since quitting: 26.3  Smokeless Tobacco Never    Labs: Review Flowsheet  More data exists      Latest Ref Rng & Units 02/11/2023 02/12/2023 02/13/2023 02/14/2023  06/21/2023  Labs for ITP Cardiac and Pulmonary Rehab  Cholestrol 100 - 199 mg/dL - - - - 161   LDL (calc) 0 - 99 mg/dL - - - - 94   HDL-C >09 mg/dL - - - - 67   Trlycerides 0 - 149 mg/dL - - - - 604   O2 Saturation % 77.2  76.1  68.3  60.4  -    Capillary Blood Glucose: Lab Results  Component Value Date   GLUCAP 87 02/19/2023   GLUCAP 103 (H) 02/19/2023   GLUCAP 121 (H) 02/18/2023   GLUCAP 111 (H) 02/18/2023   GLUCAP 110 (H) 02/17/2023     Exercise Target Goals: Exercise Program Goal: Individual exercise prescription set using results from initial 6 min walk test and THRR while considering  patient's activity barriers and safety.   Exercise Prescription Goal: Initial exercise prescription builds to 30-45 minutes a day of aerobic activity, 2-3 days per week.  Home exercise guidelines will be given to patient during program as part of exercise prescription that the participant will acknowledge.  Activity Barriers & Risk Stratification:  Activity Barriers & Cardiac Risk Stratification - 07/26/23 1419       Activity Barriers & Cardiac Risk Stratification   Activity Barriers Deconditioning;Back Problems;Neck/Spine Problems;Balance Concerns;Decreased Ventricular Function    Cardiac Risk Stratification High   <5 METs on            6 Minute Walk:  6 Minute Walk     Row Name 07/26/23 1619         6 Minute Walk   Phase Initial     Distance 1180 feet     Walk Time 6 minutes     # of Rest Breaks 1  2:20-3:20 3/10 hip pain     MPH 2.23     METS 2.6     RPE 10     Perceived Dyspnea  1     VO2 Peak 9.11     Symptoms Yes (comment)     Comments Pt SpO2 down to 84%, recovered to 94% in around a minute and maintained. RPD = 1, resolved with rest. 6/10 Hip pain at end of     Resting HR 68 bpm     Resting BP 112/60     Resting Oxygen Saturation  92 %     Exercise Oxygen Saturation  during 6 min walk 84 %     Max Ex. HR 100 bpm     Max Ex. BP 136/66     2 Minute  Post BP 120/62              Oxygen Initial Assessment:   Oxygen Re-Evaluation:   Oxygen Discharge (Final Oxygen Re-Evaluation):  Initial Exercise Prescription:  Initial Exercise Prescription - 07/26/23 1600       Date of Initial Exercise RX and Referring Provider   Date 07/26/23    Referring Provider Truett Mainland, MD    Expected Discharge Date 10/20/23      Recumbant Bike   Level 1    RPM 50    Watts 30    Minutes 15    METs 2      NuStep   Level 1    SPM 60    Minutes 15    METs 2.5      Prescription Details   Frequency (times per week) 3    Duration Progress to 30 minutes of continuous aerobic without signs/symptoms of physical distress      Intensity   THRR 40-80% of Max Heartrate 60-121    Ratings of Perceived Exertion 11-13    Perceived Dyspnea 0-4      Progression   Progression Continue progressive overload as per policy without signs/symptoms or physical distress.      Resistance Training   Training Prescription Yes    Weight 2    Reps 10-15             Perform Capillary Blood Glucose checks as needed.  Exercise Prescription Changes:   Exercise Prescription Changes     Row Name 07/28/23 1400 08/18/23 1500           Response to Exercise   Blood Pressure (Admit) 128/68 122/70      Blood Pressure (Exercise) 138/78 144/70      Blood Pressure (Exit) 112/70 100/70      Heart Rate (Admit) 75 bpm 82 bpm      Heart Rate (Exercise) 102 bpm 107 bpm      Heart Rate (Exit) 81 bpm 81 bpm      Oxygen Saturation (Admit) 87 % --      Oxygen Saturation (Exercise) 89 % 91 %  2 L O2 via n/c      Oxygen Saturation (Exit) 94 % 93 %      Rating of Perceived Exertion (Exercise) 11 13      Perceived Dyspnea (Exercise) 0.5 1      Symptoms SOB SOB      Comments Pt's first day in the CRP2 program Reviewed METs      Duration Continue with 30 min of aerobic exercise without signs/symptoms of physical distress. Continue with 30 min of aerobic  exercise without signs/symptoms of physical distress.      Intensity THRR unchanged THRR unchanged        Progression   Progression Continue to progress workloads to maintain intensity without signs/symptoms of physical distress. Continue to progress workloads to maintain intensity without signs/symptoms of physical distress.      Average METs 2.15 2.4        Resistance Training   Training Prescription No No      Weight No weights on wednesdays No weights on wednesdays        Interval Training   Interval Training No No        Recumbant Bike   Level 1 1      RPM 62 70      Watts 16 16      Minutes 15 15      METs 2 2        NuStep   Level 1 1      SPM 84 95  Minutes 15 15      METs 2.3 2.8               Exercise Comments:   Exercise Comments     Row Name 07/28/23 1415 08/18/23 1400         Exercise Comments Pt's first day in the CRP2 program. Pt did have some mild SOB with exercise (RPD = .5). Will continue to montior and progress as tolerated. Reviwed METs. Pt is making slow progress. Order for O2 with exercise was obtained due to patient's drop in SaO2 during exercise.               Exercise Goals and Review:   Exercise Goals     Row Name 07/26/23 1623             Exercise Goals   Increase Physical Activity Yes       Intervention Provide advice, education, support and counseling about physical activity/exercise needs.;Develop an individualized exercise prescription for aerobic and resistive training based on initial evaluation findings, risk stratification, comorbidities and participant's personal goals.       Expected Outcomes Short Term: Attend rehab on a regular basis to increase amount of physical activity.;Long Term: Exercising regularly at least 3-5 days a week.;Long Term: Add in home exercise to make exercise part of routine and to increase amount of physical activity.       Increase Strength and Stamina Yes       Intervention Develop an  individualized exercise prescription for aerobic and resistive training based on initial evaluation findings, risk stratification, comorbidities and participant's personal goals.;Provide advice, education, support and counseling about physical activity/exercise needs.       Expected Outcomes Short Term: Increase workloads from initial exercise prescription for resistance, speed, and METs.;Short Term: Perform resistance training exercises routinely during rehab and add in resistance training at home;Long Term: Improve cardiorespiratory fitness, muscular endurance and strength as measured by increased METs and functional capacity ( )       Able to understand and use rate of perceived exertion (RPE) scale Yes       Intervention Provide education and explanation on how to use RPE scale       Expected Outcomes Short Term: Able to use RPE daily in rehab to express subjective intensity level;Long Term:  Able to use RPE to guide intensity level when exercising independently       Knowledge and understanding of Target Heart Rate Range (THRR) Yes       Intervention Provide education and explanation of THRR including how the numbers were predicted and where they are located for reference       Expected Outcomes Short Term: Able to state/look up THRR;Short Term: Able to use daily as guideline for intensity in rehab;Long Term: Able to use THRR to govern intensity when exercising independently       Understanding of Exercise Prescription Yes       Intervention Provide education, explanation, and written materials on patient's individual exercise prescription       Expected Outcomes Short Term: Able to explain program exercise prescription;Long Term: Able to explain home exercise prescription to exercise independently                Exercise Goals Re-Evaluation :  Exercise Goals Re-Evaluation     Row Name 07/28/23 1414             Exercise Goal Re-Evaluation   Exercise Goals Review Increase Physical  Activity;Increase Strength and  Stamina;Able to understand and use rate of perceived exertion (RPE) scale;Knowledge and understanding of Target Heart Rate Range (THRR);Understanding of Exercise Prescription       Comments Pt's first day in the CRP2 program. Pt understands the exercise RX, RPE sclae and THRR.       Expected Outcomes Will continue to monitor patient and progress exercise workloads as tolerated.                Discharge Exercise Prescription (Final Exercise Prescription Changes):  Exercise Prescription Changes - 08/18/23 1500       Response to Exercise   Blood Pressure (Admit) 122/70    Blood Pressure (Exercise) 144/70    Blood Pressure (Exit) 100/70    Heart Rate (Admit) 82 bpm    Heart Rate (Exercise) 107 bpm    Heart Rate (Exit) 81 bpm    Oxygen Saturation (Exercise) 91 %   2 L O2 via n/c   Oxygen Saturation (Exit) 93 %    Rating of Perceived Exertion (Exercise) 13    Perceived Dyspnea (Exercise) 1    Symptoms SOB    Comments Reviewed METs    Duration Continue with 30 min of aerobic exercise without signs/symptoms of physical distress.    Intensity THRR unchanged      Progression   Progression Continue to progress workloads to maintain intensity without signs/symptoms of physical distress.    Average METs 2.4      Resistance Training   Training Prescription No    Weight No weights on wednesdays      Interval Training   Interval Training No      Recumbant Bike   Level 1    RPM 70    Watts 16    Minutes 15    METs 2      NuStep   Level 1    SPM 95    Minutes 15    METs 2.8             Nutrition:  Target Goals: Understanding of nutrition guidelines, daily intake of sodium 1500mg , cholesterol 200mg , calories 30% from fat and 7% or less from saturated fats, daily to have 5 or more servings of fruits and vegetables.  Biometrics:  Pre Biometrics - 07/26/23 1416       Pre Biometrics   Waist Circumference 48 inches    Hip Circumference 51  inches    Waist to Hip Ratio 0.94 %    Triceps Skinfold 16 mm    % Body Fat 41.8 %    Grip Strength 20 kg    Flexibility 13.5 in    Single Leg Stand 11.25 seconds              Nutrition Therapy Plan and Nutrition Goals:  Nutrition Therapy & Goals - 08/24/23 1018       Nutrition Therapy   Diet Heart Healthy Diet    Drug/Food Interactions Statins/Certain Fruits      Personal Nutrition Goals   Nutrition Goal Patient to identify strategies for reducing cardiovascular risk by attending the [facility] education and nutrition series weekly.   goal in progress.   Personal Goal #2 Patient to improve diet quality by using the plate method as a guide for meal planning to include lean protein/plant protein, fruits, vegetables, whole grains, nonfat dairy as part of a well-balanced diet.   goal in progress.   Personal Goal #3 Patient to identify strategies for weight loss of 0.5-2.0# per week.   goal  not met.   Comments Goals in progress. Madison West has medical history of hyperlipidemia, s/p TAVR, STEMI, CHF, HTN, CAD, carotid artery stenosis. She continues to attend  the [facility] education and nutrition series regularly. She is motivated to lose weight to 170#; however, she has not lost weight since starting with our program. LDL remains above goal; she continues zetia, crestor and heart healthy diet modifications. Patient will benefit from participation in intensive cardiac rehab for nutrition, exercise, and lifestyle modification.      Intervention Plan   Intervention Prescribe, educate and counsel regarding individualized specific dietary modifications aiming towards targeted core components such as weight, hypertension, lipid management, diabetes, heart failure and other comorbidities.;Nutrition handout(s) given to patient.    Expected Outcomes Short Term Goal: Understand basic principles of dietary content, such as calories, fat, sodium, cholesterol and nutrients.;Long Term Goal: Adherence to  prescribed nutrition plan.             Nutrition Assessments:  MEDIFICTS Score Key: >=70 Need to make dietary changes  40-70 Heart Healthy Diet <= 40 Therapeutic Level Cholesterol Diet    Picture Your Plate Scores: <16 Unhealthy dietary pattern with much room for improvement. 41-50 Dietary pattern unlikely to meet recommendations for good health and room for improvement. 51-60 More healthful dietary pattern, with some room for improvement.  >60 Healthy dietary pattern, although there may be some specific behaviors that could be improved.    Nutrition Goals Re-Evaluation:  Nutrition Goals Re-Evaluation     Row Name 08/24/23 1018             Goals   Current Weight 202 lb 6.1 oz (91.8 kg)       Comment LDL 94, Lpa 227       Expected Outcome Goals in progress. Madison West has medical history of hyperlipidemia, s/p TAVR, STEMI, CHF, HTN, CAD, carotid artery stenosis. She continues to attend the [facility] education and nutrition series regularly. She is motivated to lose weight to 170#; however, she has not lost weight since starting with our program. LDL remains above goal; she continues zetia, crestor and heart healthy diet modifications. Patient will benefit from participation in intensive cardiac rehab for nutrition, exercise, and lifestyle modification.                Nutrition Goals Re-Evaluation:  Nutrition Goals Re-Evaluation     Row Name 08/24/23 1018             Goals   Current Weight 202 lb 6.1 oz (91.8 kg)       Comment LDL 94, Lpa 227       Expected Outcome Goals in progress. Madison West has medical history of hyperlipidemia, s/p TAVR, STEMI, CHF, HTN, CAD, carotid artery stenosis. She continues to attend the [facility] education and nutrition series regularly. She is motivated to lose weight to 170#; however, she has not lost weight since starting with our program. LDL remains above goal; she continues zetia, crestor and heart healthy diet modifications. Patient will  benefit from participation in intensive cardiac rehab for nutrition, exercise, and lifestyle modification.                Nutrition Goals Discharge (Final Nutrition Goals Re-Evaluation):  Nutrition Goals Re-Evaluation - 08/24/23 1018       Goals   Current Weight 202 lb 6.1 oz (91.8 kg)    Comment LDL 94, Lpa 227    Expected Outcome Goals in progress. Madison West has medical history of hyperlipidemia, s/p TAVR, STEMI, CHF,  HTN, CAD, carotid artery stenosis. She continues to attend the [facility] education and nutrition series regularly. She is motivated to lose weight to 170#; however, she has not lost weight since starting with our program. LDL remains above goal; she continues zetia, crestor and heart healthy diet modifications. Patient will benefit from participation in intensive cardiac rehab for nutrition, exercise, and lifestyle modification.             Psychosocial: Target Goals: Acknowledge presence or absence of significant depression and/or stress, maximize coping skills, provide positive support system. Participant is able to verbalize types and ability to use techniques and skills needed for reducing stress and depression.  Initial Review & Psychosocial Screening:  Initial Psych Review & Screening - 07/26/23 1420       Initial Review   Current issues with Current Anxiety/Panic;Current Stress Concerns    Source of Stress Concerns Chronic Illness;Family    Comments Madison West shared that she has high levels of anxiety and stress due to the amount of tasks she has to do throughout her day. She feels the xanax is helping and denies any need for additional resources at this time. She has been very fatigued since her event. Both daughters and grandson live in her home, while they do offer support they are not helpful with house chores and this is stressful for [employer].      Family Dynamics   Good Support System? Yes   Madison West has her daughters and friends for support     Barriers   Psychosocial  barriers to participate in program The patient should benefit from training in stress management and relaxation.      Screening Interventions   Interventions Provide feedback about the scores to participant;To provide support and resources with identified psychosocial needs;Encouraged to exercise    Expected Outcomes Long Term goal: The participant improves quality of Life and PHQ9 Scores as seen by post scores and/or verbalization of changes;Short Term goal: Identification and review with participant of any Quality of Life or Depression concerns found by scoring the questionnaire.;Long Term Goal: Stressors or current issues are controlled or eliminated.             Quality of Life Scores:  Quality of Life - 07/26/23 1623       Quality of Life   Select Quality of Life      Quality of Life Scores   Health/Function Pre 20 %    Socioeconomic Pre 26.57 %    Psych/Spiritual Pre 18.86 %    Family Pre 18 %    GLOBAL Pre 21.1 %            Scores of 19 and below usually indicate a poorer quality of life in these areas.  A difference of  2-3 points is a clinically meaningful difference.  A difference of 2-3 points in the total score of the Quality of Life Index has been associated with significant improvement in overall quality of life, self-image, physical symptoms, and general health in studies assessing change in quality of life.  PHQ-9: Review Flowsheet  More data exists      08/02/2023 07/26/2023 07/05/2023 05/10/2023 01/19/2020  Depression screen PHQ 2/9  Decreased Interest 0 1 0 0 0  Down, Depressed, Hopeless 0 0 0 0 0  PHQ - 2 Score 0 1 0 0 0  Altered sleeping - 2 - 0 -  Tired, decreased energy - 3 - 2 -  Change in appetite - 1 - 0 -  Feeling bad or failure about yourself  - 0 - 0 -  Trouble concentrating - 0 - 0 -  Moving slowly or fidgety/restless - 0 - 0 -  Suicidal thoughts - 0 - 0 -  PHQ-9 Score - 7 - 2 -  Difficult doing work/chores - Not difficult at all - Somewhat  difficult -   Interpretation of Total Score  Total Score Depression Severity:  1-4 = Minimal depression, 5-9 = Mild depression, 10-14 = Moderate depression, 15-19 = Moderately severe depression, 20-27 = Severe depression   Psychosocial Evaluation and Intervention:   Psychosocial Re-Evaluation:  Psychosocial Re-Evaluation     Row Name 08/11/23 4098 08/19/23 1534           Psychosocial Re-Evaluation   Current issues with Current Stress Concerns;Current Anxiety/Panic Current Stress Concerns;Current Anxiety/Panic      Comments Reviewed quality of life and PHq2-9.Madison West says she is dissatisfied with her health due to her recent MI, Stenting and TAVR. Madison West says that her adult children who live with her do not help as they should. Madison West denies being depressed currently. Madison West takes xanax for anxiety. Madison West says that her anxiety is controlled. Madison West sees a psychiatrist once a year.  Offered emotional support and reassurance. Madison West continues to voice having family stress with her live in children. Madison West also reports having stress about her respitory status as she is now wearing oxygen during exercise. Emotional support provided.      Expected Outcomes Madison West will have decreased or controlled anxiety/ stressors upon completion of cardiac rehab Madison West will have decreased or controlled anxiety/ stressors upon completion of cardiac rehab      Interventions Stress management education;Relaxation education;Encouraged to attend Cardiac Rehabilitation for the exercise Stress management education;Relaxation education;Encouraged to attend Cardiac Rehabilitation for the exercise      Continue Psychosocial Services  Follow up required by staff Follow up required by staff        Initial Review   Source of Stress Concerns Chronic Illness;Family Chronic Illness;Family      Comments Will continue to monitor and offer support as needed. Will continue to monitor and offer support as needed.               Psychosocial Discharge  (Final Psychosocial Re-Evaluation):  Psychosocial Re-Evaluation - 08/19/23 1534       Psychosocial Re-Evaluation   Current issues with Current Stress Concerns;Current Anxiety/Panic    Comments Madison West continues to voice having family stress with her live in children. Madison West also reports having stress about her respitory status as she is now wearing oxygen during exercise. Emotional support provided.    Expected Outcomes Madison West will have decreased or controlled anxiety/ stressors upon completion of cardiac rehab    Interventions Stress management education;Relaxation education;Encouraged to attend Cardiac Rehabilitation for the exercise    Continue Psychosocial Services  Follow up required by staff      Initial Review   Source of Stress Concerns Chronic Illness;Family    Comments Will continue to monitor and offer support as needed.             Vocational Rehabilitation: Provide vocational rehab assistance to qualifying candidates.   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 Participant will state understanding/return demonstration of topics presented.    Education     Row Name 07/28/23 1300     Education   Cardiac Education Topics Pritikin   Secondary school teacher School   Educator Respiratory Therapist;Nurse   Weekly Topic Powerhouse Plant-Based Proteins   Instruction Review Code 1- Verbalizes Understanding   Class Start Time 1150   Class Stop Time 1225   Class Time Calculation (min) 35 min    Row Name 08/09/23 1300     Education   Cardiac Education Topics Pritikin   Nurse, children's Exercise Physiologist   Select Psychosocial    Psychosocial Healthy Minds, Bodies, Hearts   Instruction Review Code 1- Verbalizes Understanding   Class Start Time 1155   Class Stop Time 1240   Class Time Calculation (min) 45 min    Row Name 08/16/23 1300     Education   Cardiac Education Topics Pritikin   Select Core Videos     Core Videos   Educator Nurse   Select Nutrition   Nutrition Becoming a Pritikin Chef   Instruction Review Code 1- Verbalizes Understanding   Class Start Time 1150   Class Stop Time 1226   Class Time Calculation (min) 36 min    Row Name 08/18/23 1300     Education   Cardiac Education Topics Pritikin   Secondary school teacher School   Educator Dietitian;Nurse   Weekly Topic Personalizing Your Pritikin Plate   Instruction Review Code 1- Verbalizes Understanding   Class Start Time 1149   Class Stop Time 1233   Class Time Calculation (min) 44 min    Row Name 08/23/23 1600     Education   Cardiac Education Topics Pritikin   Select Workshops     Workshops   Educator Exercise Physiologist   Select Psychosocial   Psychosocial Workshop Recognizing and Reducing Stress   Instruction Review Code 1- Verbalizes Understanding   Class Start Time 1145   Class Stop Time 1228   Class Time Calculation (min) 43 min            Core Videos: Exercise    Move It!  Clinical staff conducted group or individual video education with verbal and written material and guidebook.  Patient learns the recommended Pritikin exercise program. Exercise with the goal of living a long, healthy life. Some of the health benefits of exercise include controlled diabetes, healthier blood pressure levels, improved cholesterol levels, improved heart and lung capacity, improved sleep, and better body composition. Everyone should speak with their doctor before starting or changing an exercise routine.  Biomechanical Limitations Clinical staff conducted group or individual video education with verbal and written  material and guidebook.  Patient learns how biomechanical limitations can impact exercise and how we can mitigate and possibly overcome limitations to have an impactful and balanced exercise routine.  Body Composition Clinical staff conducted group or individual video education with verbal and written material and guidebook.  Patient learns that body composition (ratio of muscle mass to fat mass) is a key component to assessing overall fitness, rather than body weight alone. Increased fat mass, especially visceral belly fat, can put [place] at increased risk for metabolic syndrome, type 2 diabetes, heart disease, and even death. It is recommended to combine diet and exercise (cardiovascular and resistance training) to improve your  body composition. Seek guidance from your physician and exercise physiologist before implementing an exercise routine.  Exercise Action Plan Clinical staff conducted group or individual video education with verbal and written material and guidebook.  Patient learns the recommended strategies to achieve and enjoy long-term exercise adherence, including variety, self-motivation, self-efficacy, and positive decision making. Benefits of exercise include fitness, good health, weight management, more energy, better sleep, less stress, and overall well-being.  Medical   Heart Disease Risk Reduction Clinical staff conducted group or individual video education with verbal and written material and guidebook.  Patient learns our heart is our most vital organ as it circulates oxygen, nutrients, white blood cells, and hormones throughout the entire body, and carries waste away. Data supports a plant-based eating plan like the Pritikin Program for its effectiveness in slowing progression of and reversing heart disease. The video provides a number of recommendations to address heart disease.   Metabolic Syndrome and Belly Fat  Clinical staff conducted group or individual video education with  verbal and written material and guidebook.  Patient learns what metabolic syndrome is, how it leads to heart disease, and how one can reverse it and keep it from coming back. You have metabolic syndrome if you have 3 of the following 5 criteria: abdominal obesity, high blood pressure, high triglycerides, low HDL cholesterol, and high blood sugar.  Hypertension and Heart Disease Clinical staff conducted group or individual video education with verbal and written material and guidebook.  Patient learns that high blood pressure, or hypertension, is very common in the [place]. Hypertension is largely due to excessive salt intake, but other important risk factors include being overweight, physical inactivity, drinking too much alcohol, smoking, and not eating enough potassium from fruits and vegetables. High blood pressure is a leading risk factor for heart attack, stroke, congestive heart failure, dementia, kidney failure, and premature death. Long-term effects of excessive salt intake include stiffening of the arteries and thickening of heart muscle and organ damage. Recommendations include ways to reduce hypertension and the risk of heart disease.  Diseases of Our Time - Focusing on Diabetes Clinical staff conducted group or individual video education with verbal and written material and guidebook.  Patient learns why the best way to stop diseases of our time is prevention, through food and other lifestyle changes. Medicine (such as prescription pills and surgeries) is often only a Band-Aid on the problem, not a long-term solution. Most common diseases of our time include obesity, type 2 diabetes, hypertension, heart disease, and cancer. The Pritikin Program is recommended and has been proven to help reduce, reverse, and/or prevent the damaging effects of metabolic syndrome.  Nutrition   Overview of the Pritikin Eating Plan  Clinical staff conducted group or individual video education with verbal  and written material and guidebook.  Patient learns about the Pritikin Eating Plan for disease risk reduction. The Pritikin Eating Plan emphasizes a wide variety of unrefined, minimally-processed carbohydrates, like fruits, vegetables, whole grains, and legumes. Go, Caution, and Stop food choices are explained. Plant-based and lean animal proteins are emphasized. Rationale provided for low sodium intake for blood pressure control, low added sugars for blood sugar stabilization, and low added fats and oils for coronary artery disease risk reduction and weight management.  Calorie Density  Clinical staff conducted group or individual video education with verbal and written material and guidebook.  Patient learns about calorie density and how it impacts the Pritikin Eating Plan. Knowing the characteristics of the food you choose will  help you decide whether those foods will lead to weight gain or weight loss, and whether you want to consume more or less of them. Weight loss is usually a side effect of the Pritikin Eating Plan because of its focus on low calorie-dense foods.  Label Reading  Clinical staff conducted group or individual video education with verbal and written material and guidebook.  Patient learns about the Pritikin recommended label reading guidelines and corresponding recommendations regarding calorie density, added sugars, sodium content, and whole grains.  Dining Out - Part 1  Clinical staff conducted group or individual video education with verbal and written material and guidebook.  Patient learns that restaurant meals can be sabotaging because they can be so high in calories, fat, sodium, and/or sugar. Patient learns recommended strategies on how to positively address this and avoid unhealthy pitfalls.  Facts on Fats  Clinical staff conducted group or individual video education with verbal and written material and guidebook.  Patient learns that lifestyle modifications can be just  as effective, if not more so, as many medications for lowering your risk of heart disease. A Pritikin lifestyle can help to reduce your risk of inflammation and atherosclerosis (cholesterol build-up, or plaque, in the artery walls). Lifestyle interventions such as dietary choices and physical activity address the cause of atherosclerosis. A review of the types of fats and their impact on blood cholesterol levels, along with dietary recommendations to reduce fat intake is also included.  Nutrition Action Plan  Clinical staff conducted group or individual video education with verbal and written material and guidebook.  Patient learns how to incorporate Pritikin recommendations into their lifestyle. Recommendations include planning and keeping personal health goals in mind as an important part of their success.  Healthy Mind-Set    Healthy Minds, Bodies, Hearts  Clinical staff conducted group or individual video education with verbal and written material and guidebook.  Patient learns how to identify when they are stressed. Video will discuss the impact of that stress, as well as the many benefits of stress management. Patient will also be introduced to stress management techniques. The way we think, act, and feel has an impact on our hearts.  How Our Thoughts Can Heal Our Hearts  Clinical staff conducted group or individual video education with verbal and written material and guidebook.  Patient learns that negative thoughts can cause depression and anxiety. This can result in negative lifestyle behavior and serious health problems. Cognitive behavioral therapy is an effective method to help control our thoughts in order to change and improve our emotional outlook.  Additional Videos:  Exercise    Improving Performance  Clinical staff conducted group or individual video education with verbal and written material and guidebook.  Patient learns to use a non-linear approach by alternating intensity  levels and lengths of time spent exercising to help burn more calories and lose more body fat. Cardiovascular exercise helps improve heart health, metabolism, hormonal balance, blood sugar control, and recovery from fatigue. Resistance training improves strength, endurance, balance, coordination, reaction time, metabolism, and muscle mass. Flexibility exercise improves circulation, posture, and balance. Seek guidance from your physician and exercise physiologist before implementing an exercise routine and learn your capabilities and proper form for all exercise.  Introduction to Yoga  Clinical staff conducted group or individual video education with verbal and written material and guidebook.  Patient learns about yoga, a discipline of the coming together of mind, breath, and body. The benefits of yoga include improved flexibility, improved range of  motion, better posture and core strength, increased lung function, weight loss, and positive self-image. Yoga's heart health benefits include lowered blood pressure, healthier heart rate, decreased cholesterol and triglyceride levels, improved immune function, and reduced stress. Seek guidance from your physician and exercise physiologist before implementing an exercise routine and learn your capabilities and proper form for all exercise.  Medical   Aging: Enhancing Your Quality of Life  Clinical staff conducted group or individual video education with verbal and written material and guidebook.  Patient learns key strategies and recommendations to stay in good physical health and enhance quality of life, such as prevention strategies, having an advocate, securing a Health Care Proxy and Power of Attorney, and keeping a list of medications and system for tracking them. It also discusses how to avoid risk for bone loss.  Biology of Weight Control  Clinical staff conducted group or individual video education with verbal and written material and guidebook.   Patient learns that weight gain occurs because we consume more calories than we burn (eating more, moving less). Even if your body weight is normal, you may have higher ratios of fat compared to muscle mass. Too much body fat puts you at increased risk for cardiovascular disease, heart attack, stroke, type 2 diabetes, and obesity-related cancers. In addition to exercise, following the Pritikin Eating Plan can help reduce your risk.  Decoding Lab Results  Clinical staff conducted group or individual video education with verbal and written material and guidebook.  Patient learns that lab test reflects one measurement whose values change over time and are influenced by many factors, including medication, stress, sleep, exercise, food, hydration, pre-existing medical conditions, and more. It is recommended to use the knowledge from this video to become more involved with your lab results and evaluate your numbers to speak with your doctor.   Diseases of Our Time - Overview  Clinical staff conducted group or individual video education with verbal and written material and guidebook.  Patient learns that according to the CDC, 50% to 70% of chronic diseases (such as obesity, type 2 diabetes, elevated lipids, hypertension, and heart disease) are avoidable through lifestyle improvements including healthier food choices, listening to satiety cues, and increased physical activity.  Sleep Disorders Clinical staff conducted group or individual video education with verbal and written material and guidebook.  Patient learns how good quality and duration of sleep are important to overall health and well-being. Patient also learns about sleep disorders and how they impact health along with recommendations to address them, including discussing with a physician.  Nutrition  Dining Out - Part 2 Clinical staff conducted group or individual video education with verbal and written material and guidebook.  Patient learns  how to plan ahead and communicate in order to maximize their dining experience in a healthy and nutritious manner. Included are recommended food choices based on the type of restaurant the patient is visiting.   Fueling a Banker conducted group or individual video education with verbal and written material and guidebook.  There is a strong connection between our food choices and our health. Diseases like obesity and type 2 diabetes are very prevalent and are in large-part due to lifestyle choices. The Pritikin Eating Plan provides plenty of food and hunger-curbing satisfaction. It is easy to follow, affordable, and helps reduce health risks.  Menu Workshop  Clinical staff conducted group or individual video education with verbal and written material and guidebook.  Patient learns that restaurant meals can sabotage  health goals because they are often packed with calories, fat, sodium, and sugar. Recommendations include strategies to plan ahead and to communicate with the manager, chef, or server to help order a healthier meal.  Planning Your Eating Strategy  Clinical staff conducted group or individual video education with verbal and written material and guidebook.  Patient learns about the Pritikin Eating Plan and its benefit of reducing the risk of disease. The Pritikin Eating Plan does not focus on calories. Instead, it emphasizes high-quality, nutrient-rich foods. By knowing the characteristics of the foods, we choose, we can determine their calorie density and make informed decisions.  Targeting Your Nutrition Priorities  Clinical staff conducted group or individual video education with verbal and written material and guidebook.  Patient learns that lifestyle habits have a tremendous impact on disease risk and progression. This video provides eating and physical activity recommendations based on your personal health goals, such as reducing LDL cholesterol, losing weight,  preventing or controlling type 2 diabetes, and reducing high blood pressure.  Vitamins and Minerals  Clinical staff conducted group or individual video education with verbal and written material and guidebook.  Patient learns different ways to obtain key vitamins and minerals, including through a recommended healthy diet. It is important to discuss all supplements you take with your doctor.   Healthy Mind-Set    Smoking Cessation  Clinical staff conducted group or individual video education with verbal and written material and guidebook.  Patient learns that cigarette smoking and tobacco addiction pose a serious health risk which affects millions of people. Stopping smoking will significantly reduce the risk of heart disease, lung disease, and many forms of cancer. Recommended strategies for quitting are covered, including working with your doctor to develop a successful plan.  Culinary   Becoming a Set designer conducted group or individual video education with verbal and written material and guidebook.  Patient learns that cooking at home can be healthy, cost-effective, quick, and puts them in control. Keys to cooking healthy recipes will include looking at your recipe, assessing your equipment needs, planning ahead, making it simple, choosing cost-effective seasonal ingredients, and limiting the use of added fats, salts, and sugars.  Cooking - Breakfast and Snacks  Clinical staff conducted group or individual video education with verbal and written material and guidebook.  Patient learns how important breakfast is to satiety and nutrition through the entire day. Recommendations include key foods to eat during breakfast to help stabilize blood sugar levels and to prevent overeating at meals later in the day. Planning ahead is also a key component.  Cooking - Educational psychologist conducted group or individual video education with verbal and written material and  guidebook.  Patient learns eating strategies to improve overall health, including an approach to cook more at home. Recommendations include thinking of animal protein as a side on your plate rather than center stage and focusing instead on lower calorie dense options like vegetables, fruits, whole grains, and plant-based proteins, such as beans. Making sauces in large quantities to freeze for later and leaving the skin on your vegetables are also recommended to maximize your experience.  Cooking - Healthy Salads and Dressing Clinical staff conducted group or individual video education with verbal and written material and guidebook.  Patient learns that vegetables, fruits, whole grains, and legumes are the foundations of the Pritikin Eating Plan. Recommendations include how to incorporate each of these in flavorful and healthy salads, and how to create homemade  salad dressings. Proper handling of ingredients is also covered. Cooking - Soups and [employer] - Soups and Desserts Clinical staff conducted group or individual video education with verbal and written material and guidebook.  Patient learns that Pritikin soups and desserts make for easy, nutritious, and delicious snacks and meal components that are low in sodium, fat, sugar, and calorie density, while high in vitamins, minerals, and filling fiber. Recommendations include simple and healthy ideas for soups and desserts.   Overview     The Pritikin Solution Program Overview Clinical staff conducted group or individual video education with verbal and written material and guidebook.  Patient learns that the results of the Pritikin Program have been documented in more than 100 articles published in peer-reviewed journals, and the benefits include reducing risk factors for (and, in some cases, even reversing) high cholesterol, high blood pressure, type 2 diabetes, obesity, and more! An overview of the three key pillars of the Pritikin Program  will be covered: eating well, doing regular exercise, and having a healthy mind-set.  WORKSHOPS  Exercise: Exercise Basics: Building Your Action Plan Clinical staff led group instruction and group discussion with PowerPoint presentation and patient guidebook. To enhance the learning environment the use of posters, models and videos may be added. At the conclusion of this workshop, patients will comprehend the difference between physical activity and exercise, as well as the benefits of incorporating both, into their routine. Patients will understand the FITT (Frequency, Intensity, Time, and Type) principle and how to use it to build an exercise action plan. In addition, safety concerns and other considerations for exercise and cardiac rehab will be addressed by the presenter. The purpose of this lesson is to promote a comprehensive and effective weekly exercise routine in order to improve patients' overall level of fitness.   Managing Heart Disease: Your Path to a Healthier Heart Clinical staff led group instruction and group discussion with PowerPoint presentation and patient guidebook. To enhance the learning environment the use of posters, models and videos may be added.At the conclusion of this workshop, patients will understand the anatomy and physiology of the heart. Additionally, they will understand how Pritikin's three pillars impact the risk factors, the progression, and the management of heart disease.  The purpose of this lesson is to provide a high-level overview of the heart, heart disease, and how the Pritikin lifestyle positively impacts risk factors.  Exercise Biomechanics Clinical staff led group instruction and group discussion with PowerPoint presentation and patient guidebook. To enhance the learning environment the use of posters, models and videos may be added. Patients will learn how the structural parts of their bodies function and how these functions impact their daily  activities, movement, and exercise. Patients will learn how to promote a neutral spine, learn how to manage pain, and identify ways to improve their physical movement in order to promote healthy living. The purpose of this lesson is to expose patients to common physical limitations that impact physical activity. Participants will learn practical ways to adapt and manage aches and pains, and to minimize their effect on regular exercise. Patients will learn how to maintain good posture while sitting, walking, and lifting.  Balance Training and Fall Prevention  Clinical staff led group instruction and group discussion with PowerPoint presentation and patient guidebook. To enhance the learning environment the use of posters, models and videos may be added. At the conclusion of this workshop, patients will understand the importance of their sensorimotor skills (vision, proprioception, and the vestibular  system) in maintaining their ability to balance as they age. Patients will apply a variety of balancing exercises that are appropriate for their current level of function. Patients will understand the common causes for poor balance, possible solutions to these problems, and ways to modify their physical environment in order to minimize their fall risk. The purpose of this lesson is to teach patients about the importance of maintaining balance as they age and ways to minimize their risk of falling.  WORKSHOPS   Nutrition:  Fueling a Ship broker led group instruction and group discussion with PowerPoint presentation and patient guidebook. To enhance the learning environment the use of posters, models and videos may be added. Patients will review the foundational principles of the Pritikin Eating Plan and understand what constitutes a serving size in each of the food groups. Patients will also learn Pritikin-friendly foods that are better choices when away from home and review make-ahead  meal and snack options. Calorie density will be reviewed and applied to three nutrition priorities: weight maintenance, weight loss, and weight gain. The purpose of this lesson is to reinforce (in a group setting) the key concepts around what patients are recommended to eat and how to apply these guidelines when away from home by planning and selecting Pritikin-friendly options. Patients will understand how calorie density may be adjusted for different weight management goals.  Mindful Eating  Clinical staff led group instruction and group discussion with PowerPoint presentation and patient guidebook. To enhance the learning environment the use of posters, models and videos may be added. Patients will briefly review the concepts of the Pritikin Eating Plan and the importance of low-calorie dense foods. The concept of mindful eating will be introduced as well as the importance of paying attention to internal hunger signals. Triggers for non-hunger eating and techniques for dealing with triggers will be explored. The purpose of this lesson is to provide patients with the opportunity to review the basic principles of the Pritikin Eating Plan, discuss the value of eating mindfully and how to measure internal cues of hunger and fullness using the Hunger Scale. Patients will also discuss reasons for non-hunger eating and learn strategies to use for controlling emotional eating.  Targeting Your Nutrition Priorities Clinical staff led group instruction and group discussion with PowerPoint presentation and patient guidebook. To enhance the learning environment the use of posters, models and videos may be added. Patients will learn how to determine their genetic susceptibility to disease by reviewing their family history. Patients will gain insight into the importance of diet as part of an overall healthy lifestyle in mitigating the impact of genetics and other environmental insults. The purpose of this lesson is to  provide patients with the opportunity to assess their personal nutrition priorities by looking at their family history, their own health history and current risk factors. Patients will also be able to discuss ways of prioritizing and modifying the Pritikin Eating Plan for their highest risk areas  Menu  Clinical staff led group instruction and group discussion with PowerPoint presentation and patient guidebook. To enhance the learning environment the use of posters, models and videos may be added. Using menus brought in from [employer], or printed from [employer], patients will apply the Pritikin dining out guidelines that were presented in the [employer] video. Patients will also be able to practice these guidelines in a variety of provided scenarios. The purpose of this lesson is to provide patients with the opportunity to practice  hands-on learning of the [employer] guidelines with actual menus and practice scenarios.  Label Reading Clinical staff led group instruction and group discussion with PowerPoint presentation and patient guidebook. To enhance the learning environment the use of posters, models and videos may be added. Patients will review and discuss the Pritikin label reading guidelines presented in Pritikin's Label Reading Educational series video. Using fool labels brought in from local grocery stores and markets, patients will apply the label reading guidelines and determine if the packaged food meet the Pritikin guidelines. The purpose of this lesson is to provide patients with the opportunity to review, discuss, and practice hands-on learning of the Pritikin Label Reading guidelines with actual packaged food labels. Cooking School  Pritikin's [employer] are designed to teach patients ways to prepare quick, simple, and affordable recipes at home. The importance of nutrition's role in chronic disease risk reduction is reflected in its  emphasis in the overall Pritikin program. By learning how to prepare essential core Pritikin Eating Plan recipes, patients will increase control over what they eat; be able to customize the flavor of foods without the use of added salt, sugar, or fat; and improve the quality of the food they consume. By learning a set of core recipes which are easily assembled, quickly prepared, and affordable, patients are more likely to prepare more healthy foods at home. These workshops focus on convenient breakfasts, simple entres, side dishes, and desserts which can be prepared with minimal effort and are consistent with nutrition recommendations for cardiovascular risk reduction. Cooking [employer] are taught by a Armed forces logistics/support/administrative officer (RD) who has been trained by the [employer]. The chef or RD has a clear understanding of the importance of minimizing - if not completely eliminating - added fat, sugar, and sodium in recipes. Throughout the series of Cooking School Workshop sessions, patients will learn about healthy ingredients and efficient methods of cooking to build confidence in their capability to prepare    Cooking School weekly topics:  Adding Flavor- Sodium-Free  Fast and Healthy Breakfasts  Powerhouse Plant-Based Proteins  Satisfying Salads and Dressings  Simple Sides and Sauces  International Cuisine-Spotlight on the [employer] Zones  Delicious Desserts  Savory Soups  [employer] - Meals in a Astronomer Appetizers and Snacks  Comforting Weekend Breakfasts  One-Pot Wonders   Fast Evening Meals  Landscape architect Your Pritikin Plate  WORKSHOPS   Healthy Mindset (Psychosocial):  Focused Goals, Sustainable Changes Clinical staff led group instruction and group discussion with PowerPoint presentation and patient guidebook. To enhance the learning environment the use of posters, models and videos may be added. Patients will be able to apply effective  goal setting strategies to establish at least one personal goal, and then take consistent, meaningful action toward that goal. They will learn to identify common barriers to achieving personal goals and develop strategies to overcome them. Patients will also gain an understanding of how our mind-set can impact our ability to achieve goals and the importance of cultivating a positive and growth-oriented mind-set. The purpose of this lesson is to provide patients with a deeper understanding of how to set and achieve personal goals, as well as the tools and strategies needed to overcome common obstacles which may arise along the way.  From Head to Heart: The Power of a Healthy Outlook  Clinical staff led group instruction and group discussion with PowerPoint presentation and patient guidebook. To enhance the learning environment the use  of posters, models and videos may be added. Patients will be able to recognize and describe the impact of emotions and mood on physical health. They will discover the importance of self-care and explore self-care practices which may work for them. Patients will also learn how to utilize the 4 C's to cultivate a healthier outlook and better manage stress and challenges. The purpose of this lesson is to demonstrate to patients how a healthy outlook is an essential part of maintaining good health, especially as they continue their cardiac rehab journey.  Healthy Sleep for a Healthy Heart Clinical staff led group instruction and group discussion with PowerPoint presentation and patient guidebook. To enhance the learning environment the use of posters, models and videos may be added. At the conclusion of this workshop, patients will be able to demonstrate knowledge of the importance of sleep to overall health, well-being, and quality of life. They will understand the symptoms of, and treatments for, common sleep disorders. Patients will also be able to identify daytime and nighttime  behaviors which impact sleep, and they will be able to apply these tools to help manage sleep-related challenges. The purpose of this lesson is to provide patients with a general overview of sleep and outline the importance of quality sleep. Patients will learn about a few of the most common sleep disorders. Patients will also be introduced to the concept of "sleep hygiene," and discover ways to self-manage certain sleeping problems through simple daily behavior changes. Finally, the workshop will motivate patients by clarifying the links between quality sleep and their goals of heart-healthy living.   Recognizing and Reducing Stress Clinical staff led group instruction and group discussion with PowerPoint presentation and patient guidebook. To enhance the learning environment the use of posters, models and videos may be added. At the conclusion of this workshop, patients will be able to understand the types of stress reactions, differentiate between acute and chronic stress, and recognize the impact that chronic stress has on their health. They will also be able to apply different coping mechanisms, such as reframing negative self-talk. Patients will have the opportunity to practice a variety of stress management techniques, such as deep abdominal breathing, progressive muscle relaxation, and/or guided imagery.  The purpose of this lesson is to educate patients on the role of stress in their lives and to provide healthy techniques for coping with it.  Learning Barriers/Preferences:  Learning Barriers/Preferences - 07/26/23 1425       Learning Barriers/Preferences   Learning Barriers None    Learning Preferences Audio;Computer/Internet;Group Instruction;Skilled Demonstration;Verbal Instruction;Video;Written Material;Pictoral;Individual Instruction             Education Topics:  Knowledge Questionnaire Score:  Knowledge Questionnaire Score - 07/26/23 1425       Knowledge Questionnaire Score    Pre Score 24/24             Core Components/Risk Factors/Patient Goals at Admission:  Personal Goals and Risk Factors at Admission - 07/26/23 1432       Core Components/Risk Factors/Patient Goals on Admission    Weight Management Yes;Obesity;Weight Loss    Intervention Weight Management: Develop a combined nutrition and exercise program designed to reach desired caloric intake, while maintaining appropriate intake of nutrient and fiber, sodium and fats, and appropriate energy expenditure required for the weight goal.;Weight Management: Provide education and appropriate resources to help participant work on and attain dietary goals.;Weight Management/Obesity: Establish reasonable short term and long term weight goals.;Obesity: Provide education and appropriate resources to help participant  work on and attain dietary goals.    Goal Weight: Long Term 170 lb (77.1 kg)   pt goal   Heart Failure Yes    Intervention Provide a combined exercise and nutrition program that is supplemented with education, support and counseling about heart failure. Directed toward relieving symptoms such as shortness of breath, decreased exercise tolerance, and extremity edema.    Expected Outcomes Improve functional capacity of life;Short term: Attendance in program 2-3 days a week with increased exercise capacity. Reported lower sodium intake. Reported increased fruit and vegetable intake. Reports medication compliance.;Short term: Daily weights obtained and reported for increase. Utilizing diuretic protocols set by physician.;Long term: Adoption of self-care skills and reduction of barriers for early signs and symptoms recognition and intervention leading to self-care maintenance.    Hypertension Yes    Intervention Provide education on lifestyle modifcations including regular physical activity/exercise, weight management, moderate sodium restriction and increased consumption of fresh fruit, vegetables, and low fat  dairy, alcohol moderation, and smoking cessation.;Monitor prescription use compliance.    Expected Outcomes Short Term: Continued assessment and intervention until BP is < 140/22mm HG in hypertensive participants. < 130/47mm HG in hypertensive participants with diabetes, heart failure or chronic kidney disease.;Long Term: Maintenance of blood pressure at goal levels.    Lipids Yes    Intervention Provide education and support for participant on nutrition & aerobic/resistive exercise along with prescribed medications to achieve LDL 70mg , HDL >40mg .    Expected Outcomes Short Term: Participant states understanding of desired cholesterol values and is compliant with medications prescribed. Participant is following exercise prescription and nutrition guidelines.;Long Term: Cholesterol controlled with medications as prescribed, with individualized exercise RX and with personalized nutrition plan. Value goals: LDL < 70mg , HDL > 40 mg.    Stress Yes    Intervention Offer individual and/or small group education and counseling on adjustment to heart disease, stress management and health-related lifestyle change. Teach and support self-help strategies.;Refer participants experiencing significant psychosocial distress to appropriate mental health specialists for further evaluation and treatment. When possible, include family members and significant others in education/counseling sessions.    Expected Outcomes Short Term: Participant demonstrates changes in health-related behavior, relaxation and other stress management skills, ability to obtain effective social support, and compliance with psychotropic medications if prescribed.;Long Term: Emotional wellbeing is indicated by absence of clinically significant psychosocial distress or social isolation.             Core Components/Risk Factors/Patient Goals Review:   Goals and Risk Factor Review     Row Name 08/11/23 4098 08/19/23 1537           Core  Components/Risk Factors/Patient Goals Review   Personal Goals Review Weight Management/Obesity;Heart Failure;Lipids;Hypertension;Stress;Improve shortness of breath with ADL's Weight Management/Obesity;Heart Failure;Lipids;Hypertension;Stress;Improve shortness of breath with ADL's      Review Tahj started cardiac rehab on 07/28/23. Avyn is off to a good start to exercise. oxygen saturatiobns have been consistantly dropping into the mid 80's. Order obtained to use oxygen with exercise. Patient agreeable.  Will use oxygen on next scheduled visit. Maylea is enjoying participating in cardiac rehab so far. Charlott is doing well with exercise at  cardiac rehab. Vital signs and oxygen saturations have been stable now that Madison West is wearing oxygen with exercise. oxygen saturations have been in the low to mid 90's on 2l/min. Korena has an upcoming appointment with her pulmonolgist in March.      Expected Outcomes Audelia will continue to participate in cardiac rehab for exercise,  nutrition and lifestyle modifications Tristine will continue to participate in cardiac rehab for exercise, nutrition and lifestyle modifications               Core Components/Risk Factors/Patient Goals at Discharge (Final Review):   Goals and Risk Factor Review - 08/19/23 1537       Core Components/Risk Factors/Patient Goals Review   Personal Goals Review Weight Management/Obesity;Heart Failure;Lipids;Hypertension;Stress;Improve shortness of breath with ADL's    Review Cheyenne is doing well with exercise at  cardiac rehab. Vital signs and oxygen saturations have been stable now that Madison West is wearing oxygen with exercise. oxygen saturations have been in the low to mid 90's on 2l/min. Jenica has an upcoming appointment with her pulmonolgist in March.    Expected Outcomes Nanami will continue to participate in cardiac rehab for exercise, nutrition and lifestyle modifications             ITP Comments:  ITP Comments      Row Name 07/26/23 1417 08/19/23 1533         ITP Comments Dr. Armanda Magic medical director. Introduction to pritikin education/intensive cardiac rehab. Initial orientation packet reviewed with patient. 30 Day ITP Review. Madison West has good attendance and participation with exercise at cardiac rehab. Madison West is off to a good start to exercise for her fitness level.               Comments: See ITP comments.

## 2023-08-25 ENCOUNTER — Encounter (HOSPITAL_COMMUNITY)
Admission: RE | Admit: 2023-08-25 | Discharge: 2023-08-25 | Disposition: A | Discharge status: Home / Self Care | Payer: Medicare Other | Source: Ambulatory Visit | Attending: Cardiology | Admitting: Cardiology

## 2023-08-25 DIAGNOSIS — I2102 ST elevation (STEMI) myocardial infarction involving left anterior descending coronary artery: Secondary | ICD-10-CM

## 2023-08-25 DIAGNOSIS — Z952 Presence of prosthetic heart valve: Secondary | ICD-10-CM

## 2023-08-25 DIAGNOSIS — Z955 Presence of coronary angioplasty implant and graft: Secondary | ICD-10-CM

## 2023-08-27 ENCOUNTER — Encounter (HOSPITAL_COMMUNITY): Payer: Medicare Other

## 2023-08-30 ENCOUNTER — Encounter (HOSPITAL_COMMUNITY)
Admission: RE | Admit: 2023-08-30 | Discharge: 2023-08-30 | Disposition: A | Discharge status: Home / Self Care | Payer: Medicare Other | Source: Ambulatory Visit | Attending: Cardiology

## 2023-08-30 DIAGNOSIS — Z952 Presence of prosthetic heart valve: Secondary | ICD-10-CM

## 2023-08-30 DIAGNOSIS — Z955 Presence of coronary angioplasty implant and graft: Secondary | ICD-10-CM

## 2023-08-30 DIAGNOSIS — I2102 ST elevation (STEMI) myocardial infarction involving left anterior descending coronary artery: Secondary | ICD-10-CM

## 2023-09-01 ENCOUNTER — Encounter (HOSPITAL_COMMUNITY)
Admission: RE | Admit: 2023-09-01 | Discharge: 2023-09-01 | Disposition: A | Discharge status: Home / Self Care | Payer: Medicare Other | Source: Ambulatory Visit | Attending: Cardiology | Admitting: Cardiology

## 2023-09-01 DIAGNOSIS — I2102 ST elevation (STEMI) myocardial infarction involving left anterior descending coronary artery: Secondary | ICD-10-CM

## 2023-09-01 DIAGNOSIS — Z955 Presence of coronary angioplasty implant and graft: Secondary | ICD-10-CM

## 2023-09-01 DIAGNOSIS — Z952 Presence of prosthetic heart valve: Secondary | ICD-10-CM | POA: Diagnosis not present

## 2023-09-03 ENCOUNTER — Encounter (HOSPITAL_COMMUNITY): Payer: Medicare Other

## 2023-09-03 ENCOUNTER — Telehealth: Payer: Self-pay | Admitting: Cardiology

## 2023-09-03 NOTE — Telephone Encounter (Signed)
 Spoke with pt, she is concerned because her BNP is 866. She denies edema and only get SOB if she is carrying her groceries. Aware the other results are not available yet so not sure any changes can be made without those results. She denies edema and reports her weight is going down. Advised patient if she were to develop SOB or edema to take a furosemide. She has a follow up appointment Monday.

## 2023-09-03 NOTE — Telephone Encounter (Signed)
 Patient called to follow up on her lab results.

## 2023-09-06 ENCOUNTER — Encounter: Payer: Self-pay | Admitting: Cardiology

## 2023-09-06 ENCOUNTER — Encounter (HOSPITAL_COMMUNITY)
Admission: RE | Admit: 2023-09-06 | Discharge: 2023-09-06 | Disposition: A | Discharge status: Home / Self Care | Source: Ambulatory Visit | Attending: Cardiology

## 2023-09-06 ENCOUNTER — Ambulatory Visit: Payer: Medicare Other | Attending: Cardiology | Admitting: Cardiology

## 2023-09-06 VITALS — BP 142/80 | HR 84 | Ht 68.0 in

## 2023-09-06 DIAGNOSIS — I2510 Atherosclerotic heart disease of native coronary artery without angina pectoris: Secondary | ICD-10-CM | POA: Insufficient documentation

## 2023-09-06 DIAGNOSIS — I2102 ST elevation (STEMI) myocardial infarction involving left anterior descending coronary artery: Secondary | ICD-10-CM

## 2023-09-06 DIAGNOSIS — Z952 Presence of prosthetic heart valve: Secondary | ICD-10-CM

## 2023-09-06 DIAGNOSIS — I5021 Acute systolic (congestive) heart failure: Secondary | ICD-10-CM | POA: Insufficient documentation

## 2023-09-06 DIAGNOSIS — I5020 Unspecified systolic (congestive) heart failure: Secondary | ICD-10-CM | POA: Insufficient documentation

## 2023-09-06 DIAGNOSIS — Z955 Presence of coronary angioplasty implant and graft: Secondary | ICD-10-CM

## 2023-09-06 MED ORDER — SPIRONOLACTONE 25 MG PO TABS: 12.5000 mg | ORAL_TABLET | Freq: Every day | ORAL | 3 refills | Status: DC

## 2023-09-06 MED ORDER — ASPIRIN 81 MG PO TBEC: 81.0000 mg | DELAYED_RELEASE_TABLET | Freq: Every day | ORAL | Status: DC

## 2023-09-06 NOTE — Progress Notes (Signed)
 Cardiology Office Note:  .   Date:  09/06/2023  ID:  Madison West, DOB [birth year], MRN [id number] PCP: April Manson, NP  [facility] Providers Cardiologist:  Truett Mainland, MD PCP: April Manson, NP  Chief Complaint  Patient presents with   HFrEF      History of Present Illness: .    Madison West is a 68 y.o. female with hyperlipidemia, CAD, prolonged hospital stay in 01/2023 with VF arrest due to acute stent thrombosis, s/p TAVR for severe AS with now recovering EF, h/o right carotid endarterectomy, h/o breast cancer, h/o provoked DVT in Lt internal jugular along with acute superficial vein thrombosis involving the left cephalic vein, proximal upper arm to mid forearm (01/2023).  Patient celebrated her 68th birthday yesterday, took a trip to [place] with her daughter and had a good time.  She has noticed exertional dyspnea recently, proBNP was elevated on recent labs.  She continues to have blood-tinged sputum.  She is currently on Plavix and Eliquis, given DVT noted in 01/2023 during prolonged hospitalization.  She has a consultation visit with pulmonology on 09/08/2023.  In the past, she has not tolerated several GDMT medications for HFrEF, including metoprolol, Entresto.  Currently, she is on losartan, [place].  Reviewed recent echocardiogram results with the patient, details below. Vitals:   09/06/23 1305  BP: (!) 142/80  Pulse: 84  SpO2: 94%      ROS:  Review of Systems  Cardiovascular:  Negative for chest pain, dyspnea on exertion, leg swelling, palpitations and syncope.     Studies Reviewed: Marland Kitchen        Independently interpreted 08/2023: ProBNP 866  05/2023: Chol 184, TG 132, HDL 67, LDL 94  04/2023: Hb 13.5 Cr 1.08  12/2022: Chol 220, TG 126, HDL 51, LDL 146  Echocardiogram 07/07/2023: 1. Left ventricular ejection fraction, by estimation, is 35 to 40%. The  left ventricle has moderately decreased function. The left ventricle   demonstrates regional wall motion abnormalities (see scoring  diagram/findings for description). Elevated left  ventricular end-diastolic pressure.   2. Right ventricular systolic function is normal. The right ventricular  size is normal. Tricuspid regurgitation signal is inadequate for assessing  PA pressure.   3. The mitral valve is normal in structure. Trivial mitral valve  regurgitation. No evidence of mitral stenosis. Moderate mitral annular  calcification.   4. Well-seated bioprosthetic valve with normal function. The aortic valve  is normal in structure. Aortic valve regurgitation is not visualized. No  aortic stenosis is present. There is a 26 mm Sapien prosthetic (TAVR)  valve present in the aortic  position.   5. The inferior vena cava is normal in size with greater than 50%  respiratory variability, suggesting right atrial pressure of 3 mmHg.    Physical Exam:   Physical Exam Vitals and nursing note reviewed.  Constitutional:      General: She is not in acute distress. Neck:     Vascular: No JVD.  Cardiovascular:     Rate and Rhythm: Normal rate and regular rhythm.     Heart sounds: Normal heart sounds. No murmur heard. Pulmonary:     Effort: Pulmonary effort is normal.     Breath sounds: Normal breath sounds. No wheezing or rales.  Musculoskeletal:     Right lower leg: No edema.     Left lower leg: No edema.     VISIT DIAGNOSES:   ICD-10-CM   1. Acute systolic congestive heart failure (HCC)  I50.21 EKG 12-Lead       ASSESSMENT AND PLAN: .    Madison West is a 68 y.o. female with hyperlipidemia, CAD, prolonged hospital stay in 01/2023 with VF arrest due to acute stent thrombosis, s/p TAVR for severe AS with now recovering EF, h/o right carotid endarterectomy, h/o breast cancer, h/o provoked DVT in Lt internal jugular along with acute superficial vein thrombosis involving the left cephalic vein, proximal upper arm to mid forearm (01/2023).   CAD: PCI  to LAD, complicated by stent thrombosis while on Plavix in the setting of severe AS and HFrEF, leading to VF arrest, treated with thrombectomy and balloon dilatation with excellent results. No anginal symptoms at this time. Given h/o stent thrombosis, I would recommend P2 Y12 therapy lifelong.   She has completed 49-month anticoagulation for provoked DVT during prolonged hospitalization. Stopped Eliquis and started aspirin 81 mg daily today, along with continued use of Plavix 75 mg daily. Recommend DAPT for at least 1 year since her MI in 01/2023. After that, would continue Plavix 75 mg daily monotherapy.   HFrEF: EF 35-40%, elevated proBNP. Started spironolactone 12.5 mg daily. Check BMP, proBNP in 1-2 weeks. Continue losartan 25 mg daily. She has not tolerated beta-blockers in the past due to dizziness, and prefers not to be on it. Recommend 4 to 6-week follow-up with Pharm.D. for heart failure medication up titration.   Hopefully we can convince her to be on Entresto and/or beta-blocker at some point in future. I will repeat echocardiogram in 01/2024.  Hemoptysis: Mild, has consultation visit with pulmonology on 09/08/2023. Of note, I have stopped her Eliquis today, and starting her on aspirin, along with continuing Plavix.  H/o VF arrest: Occurred in the setting of acute stent thrombosis in 01/2023. It is reasonable to continue amiodarone 200 mg daily for now.    F/u in 3 months  Signed, Elder Negus, MD

## 2023-09-06 NOTE — Progress Notes (Signed)
 Some of the labs from last week are returning. Cholesterol is better. We can cancel the upcoming lipid panel, but keep BMP and proBNP.  Thanks MJP

## 2023-09-06 NOTE — Patient Instructions (Signed)
 Medication Instructions:  START Spironolactone 25 mg take half tablet by mouth daily  START Aspirin 81 mg take one tablet by mouth daily   STOP Eliquis   *If you need a refill on your cardiac medications before your next appointment, please call your pharmacy*   Lab Work IN 1 TO 2 WEEKS: BMP PROBNP LIPID  If you have labs (blood work) drawn today and your tests are completely normal, you will receive your results only by: MyChart Message (if you have MyChart) OR A paper copy in the mail If you have any lab test that is abnormal or we need to change your treatment, we will call you to review the results.  Follow-Up: At [facility], you and your health needs are our priority.  As part of our continuing mission to provide you with exceptional heart care, we have created designated Provider Care Teams.  These Care Teams include your primary Cardiologist (physician) and Advanced Practice Providers (APPs -  Physician Assistants and Nurse Practitioners) who all work together to provide you with the care you need, when you need it.   Your next appointment:   3 month(s)  Provider:   Elder Negus, MD     Other Instructions FOLLOW UP IN 4 TO 6 WEEKS WITH PHARM D

## 2023-09-08 ENCOUNTER — Ambulatory Visit: Payer: Medicare Other | Admitting: Internal Medicine

## 2023-09-08 ENCOUNTER — Encounter: Payer: Self-pay | Admitting: Internal Medicine

## 2023-09-08 ENCOUNTER — Ambulatory Visit

## 2023-09-08 VITALS — BP 130/70 | HR 82 | Temp 97.6°F | Ht 68.0 in | Wt 201.2 lb

## 2023-09-08 DIAGNOSIS — R042 Hemoptysis: Secondary | ICD-10-CM

## 2023-09-08 DIAGNOSIS — R0609 Other forms of dyspnea: Secondary | ICD-10-CM | POA: Diagnosis not present

## 2023-09-08 LAB — BASIC METABOLIC PANEL
BUN/Creatinine Ratio: 15 (ref 12–28)
BUN: 15 mg/dL (ref 8–27)
CO2: 18 mmol/L — ABNORMAL LOW (ref 20–29)
Calcium: 9.6 mg/dL (ref 8.7–10.3)
Chloride: 103 mmol/L (ref 96–106)
Creatinine, Ser: 0.98 mg/dL (ref 0.57–1.00)
Glucose: 94 mg/dL (ref 70–99)
Potassium: 4.6 mmol/L (ref 3.5–5.2)
Sodium: 138 mmol/L (ref 134–144)
eGFR: 62 mL/min/{1.73_m2} (ref >59)

## 2023-09-08 LAB — CBC WITH DIFFERENTIAL/PLATELET
Basophils Absolute: 0.1 10*3/uL (ref 0.0–0.1)
Basophils Relative: 2 % (ref 0.0–3.0)
Eosinophils Absolute: 0.2 10*3/uL (ref 0.0–0.7)
Eosinophils Relative: 3.7 % (ref 0.0–5.0)
HCT: 35.9 % — ABNORMAL LOW (ref 36.0–46.0)
Hemoglobin: 11.9 g/dL — ABNORMAL LOW (ref 12.0–15.0)
Lymphocytes Relative: 19.2 % (ref 12.0–46.0)
Lymphs Abs: 1 10*3/uL (ref 0.7–4.0)
MCHC: 33.1 g/dL (ref 30.0–36.0)
MCV: 84.9 fl (ref 78.0–100.0)
Monocytes Absolute: 0.5 10*3/uL (ref 0.1–1.0)
Monocytes Relative: 9.3 % (ref 3.0–12.0)
Neutro Abs: 3.5 10*3/uL (ref 1.4–7.7)
Neutrophils Relative %: 65.8 % (ref 43.0–77.0)
Platelets: 393 10*3/uL (ref 150.0–400.0)
RBC: 4.23 Mil/uL (ref 3.87–5.11)
RDW: 14.7 % (ref 11.5–15.5)
WBC: 5.4 10*3/uL (ref 4.0–10.5)

## 2023-09-08 LAB — PRO B NATRIURETIC PEPTIDE: NT-Pro BNP: 866 pg/mL — ABNORMAL HIGH (ref 0–301)

## 2023-09-08 LAB — LIPID PANEL
Chol/HDL Ratio: 2.6 ratio (ref 0.0–4.4)
Cholesterol, Total: 143 mg/dL (ref 100–199)
HDL: 54 mg/dL (ref >39)
LDL Chol Calc (NIH): 71 mg/dL (ref 0–99)
Triglycerides: 99 mg/dL (ref 0–149)
VLDL Cholesterol Cal: 18 mg/dL (ref 5–40)

## 2023-09-08 LAB — TSH: TSH: 6.32 u[IU]/mL — ABNORMAL HIGH (ref 0.35–5.50)

## 2023-09-08 LAB — SEDIMENTATION RATE: Sed Rate: 39 mm/h — ABNORMAL HIGH (ref 0–30)

## 2023-09-08 NOTE — Progress Notes (Unsigned)
 Madison West, female    DOB: 05-Apr-1956   MRN: [id number]   Brief patient profile:  68 ywof quit smoking in 1998 referred to pulmonary clinic 09/08/2023 byDr Raulker   for cough with hemoptysis      Wt at d/c smoking 170   Started cardiac rehab aroud 07/24/23 and as of 08/30/23 started needing 2lpm with aeobic component   Cards note   09/06/23 68 y.o. female with hyperlipidemia, CAD, prolonged hospital stay in 01/2023 with VF arrest due to acute stent thrombosis, s/p TAVR for severe AS with now recovering EF, h/o right carotid endarterectomy, h/o breast cancer, h/o provoked DVT in Lt internal jugular along with acute superficial vein thrombosis involving the left cephalic vein, proximal upper arm to mid forearm (01/2023).    History of Present Illness  09/08/2023  Pulmonary/ 1st office eval/Aiden Rao on Amiodarone/ off eliquis as of 09/08/2023  Chief Complaint  Patient presents with   Consult  Dyspnea:  carrying groceries in from car / now needing 02 with heavy exertion in rehab but says cause she's working at a higher level than prior.  Cough: traces of blood daily since 01/2023 / no assoc purulent sputum or hemoptysis  Sleep: sleeping 45 degrees due to your back  SABA use: none  02 NWG:NFAO at rhab     No obvious day to day or daytime pattern/variability or assoc excess/ purulent sputum or mucus plugs or  cp or chest tightness, subjective wheeze or overt sinus or hb symptoms.    Also denies any obvious fluctuation of symptoms with weather or environmental changes or other aggravating or alleviating factors except as outlined above   No unusual exposure hx or h/o childhood pna/ asthma or knowledge of premature birth.  Current Allergies, Complete Past Medical History, Past Surgical History, Family History, and Social History were reviewed in [employer] record.  ROS  The following are not active complaints unless bolded Hoarseness, sore throat, dysphagia, dental  problems, itching, sneezing,  nasal congestion or discharge of excess mucus or purulent secretions, ear ache,   fever, chills, sweats, unintended wt loss or wt gain, classically pleuritic or exertional cp,  orthopnea pnd or arm/hand swelling  or leg swelling, presyncope, palpitations, abdominal pain, anorexia, nausea, vomiting, diarrhea  or change in bowel habits or change in bladder habits, change in stools or change in urine, dysuria, hematuria,  rash, arthralgias, visual complaints, headache, numbness, weakness or ataxia or problems with walking or coordination,  change in mood or  memory.             Outpatient Medications Prior to Visit  Medication Sig Dispense Refill   acetaminophen (TYLENOL) 325 MG tablet Take 2 tablets (650 mg total) by mouth every 6 (six) hours. (Patient taking differently: Take 500 mg by mouth every 6 (six) hours.)     alprazolam (XANAX) 2 MG tablet Take 0.25-2 mg by mouth See admin instructions. 1 mg (may take up to 2 mg) twice daily. May also take 0.25 mg during the day if needed for anxiety.     amiodarone (PACERONE) 200 MG tablet Take 1 tablet (200 mg total) by mouth daily. 30 tablet 0   aspirin EC 81 MG tablet Take 1 tablet (81 mg total) by mouth daily. Swallow whole.     busPIRone (BUSPAR) 10 MG tablet TAKE 1 TABLET BY MOUTH TWICE A DAY (Patient taking differently: Take 5 mg by mouth 2 (two) times daily.) 180 tablet 1   clopidogrel (PLAVIX)  75 MG tablet Take 1 tablet (75 mg total) by mouth daily. 90 tablet 3   dapagliflozin propanediol (FARXIGA) 10 MG TABS tablet Take 1 tablet (10 mg total) by mouth daily before breakfast. 90 tablet 3   furosemide (LASIX) 20 MG tablet Take 0.5 tablets (10 mg total) by mouth daily as needed (for lower extremity swelling and weight gain). 45 tablet 1   losartan (COZAAR) 25 MG tablet Take 1 tablet (25 mg total) by mouth daily. 90 tablet 3   methocarbamol (ROBAXIN) 500 MG tablet Take 1 tablet (500 mg total) by mouth 2 (two) times daily.  60 tablet 0   oxyCODONE (ROXICODONE) 5 MG immediate release tablet Take 1 tablet (5 mg total) by mouth daily as needed for severe pain (pain score 7-10). 30 tablet 0   rosuvastatin (CRESTOR) 20 MG tablet Take 0.5 tablets (10 mg total) by mouth daily.     spironolactone (ALDACTONE) 25 MG tablet Take 0.5 tablets (12.5 mg total) by mouth daily. 45 tablet 3   ezetimibe (ZETIA) 10 MG tablet Take 1 tablet (10 mg total) by mouth daily. (Patient not taking: Reported on 09/08/2023) 90 tablet 3   nitroGLYCERIN (NITROSTAT) 0.4 MG SL tablet Place 1 tablet (0.4 mg total) under the tongue every 5 (five) minutes as needed for chest pain. (Patient not taking: Reported on 09/08/2023) 25 tablet 2   gabapentin (NEURONTIN) 300 MG capsule Take 1 capsule (300 mg total) by mouth 3 (three) times daily. (Patient not taking: Reported on 09/08/2023) 90 capsule 0   No facility-administered medications prior to visit.    Past Medical History:  Diagnosis Date   Anxiety    Arthritis    low back and hip pain intermittent   Breast cancer (HCC) 06/02/07   r breast -surgery ,radiaology. chemotherapy   Carpal tunnel syndrome    right hand   Colon polyps    Complication of anesthesia    Fentanyl, Versed-makes extra hyper, bradycardia x 1 in PACU, [facility] (08/15/11 cardiology felt neostigmine may have resulted in AV nodal block)    Coronary artery disease    Depression    denies   Dysplasia of vulva    Hypertension    Palpitations    PSVT, s/p adenosine 08/04/16   S/P breast lumpectomy 07/04/07   R breast   S/P radiation therapy 2009      Objective:     BP 130/70 (BP Location: Left Arm, Patient Position: Sitting, Cuff Size: Small)   Pulse 82   Temp 97.6 F (36.4 C) (Oral)   Ht 5\' 8"  (1.727 m)   Wt 201 lb 3.2 oz (91.3 kg)   SpO2 93%   BMI 30.59 kg/m   SpO2: 93 %  Amb wf who was pleasant but failed to answer any specific questions tending to go off on tangents and kept coming back to events last summer that were  already discussed    HEENT : Oropharynx  clear      Nasal turbinates nl    NECK :  without  apparent JVD/ palpable Nodes/TM    LUNGS: no acc muscle use,  Nl contour chest which is clear to A and P bilaterally without cough on insp or exp maneuvers   CV:  RRR  no s3 or murmur or increase in P2, and no edema   ABD:  soft and nontender   MS:  Gait nl   ext warm without deformities Or obvious joint restrictions  calf tenderness, cyanosis or clubbing  SKIN: warm and dry without lesions    NEURO:  alert, approp, nl sensorium with  no motor or cerebellar deficits apparent.    CXR PA and Lateral:   09/08/2023 :    I personally reviewed images and impression is as follows:     No def PF or chf   Labs ordered/ reviewed:      Chemistry      Component Value Date/Time   NA 138 09/02/2023 1417   NA 139 05/17/2013 1345   K 4.6 09/02/2023 1417   K 3.7 05/17/2013 1345   CL 103 09/02/2023 1417   CL 101 07/11/2012 1139   CO2 18 (L) 09/02/2023 1417   CO2 23 05/17/2013 1345   BUN 15 09/02/2023 1417   BUN 9.1 05/17/2013 1345   CREATININE 0.98 09/02/2023 1417   CREATININE 0.75 01/17/2016 1440   CREATININE 0.8 05/17/2013 1345      Component Value Date/Time   CALCIUM 9.6 09/02/2023 1417   CALCIUM 9.6 05/17/2013 1345   ALKPHOS 58 02/19/2023 1059   ALKPHOS 69 05/17/2013 1345   AST 20 02/19/2023 1059   AST 14 05/17/2013 1345   ALT 27 02/19/2023 1059   ALT 13 05/17/2013 1345   BILITOT 0.3 02/19/2023 1059   BILITOT 0.6 12/30/2022 1604   BILITOT 0.47 05/17/2013 1345        Lab Results  Component Value Date   WBC 5.4 09/08/2023   HGB 11.9 (L) 09/08/2023   HCT 35.9 (L) 09/08/2023   MCV 84.9 09/08/2023   PLT 393.0 09/08/2023       EOS                                                              0.2                                   09/08/2023   Lab Results  Component Value Date   DDIMER 0.27 01/18/2018      Lab Results  Component Value Date   TSH 6.32 (H) 09/08/2023      Lab Results  Component Value Date   PROBNP 866 (H) 09/02/2023       Lab Results  Component Value Date   ESRSEDRATE 39 (H) 09/08/2023          Assessment   DOE (dyspnea on exertion) Quit smoking in 1998 /onset of doe assoc with heart dz summer 2024  - Amiodarone started 01/31/23  - Echo 07/07/23 LV EF35 to 40% moderately decreased function. Elevated left  ventricular end-diastolic pressure.  - Needing 02 at rehab since 08/30/23  - 09/08/2023   Walked on RA  x  3   lap(s) =  approx 750  ft  @ moderate pace, stopped due to end of study  with lowest 02 sats 90% and no sob /cp    She clearly has ongoing cardiac issues per past cards note with last bnp 866 and echo above but tol PT ok, albeit with 02 at high levels.    She is however at risk of amio toxicity : Patients typically have been on amiodarone for 6-12 months before this complication manifests.  Of note, serial clinical evaluation for symptoms such  as cough dyspnea or fevers is  the preferred method of monitoring for pulmonary toxicity because a decrease in DLCO or lung volumes is a nonspecific for toxicity. Pathologically amiodarone pulmonary toxicity may appear as interstitial pneumonitis, eosinophilic pneumonia, organizing pneumonia, pulmonary fibrosis or less commonly as diffuse alveolar hemorrhage, pulmonary nodules or pleural effusions.  Risk factors for pulmonary toxicity include age greater than 60, daily dose greater than equal to 400 mg, a high cumulative dose, or pre-existing lung disease    Best way to screen  Serial ESR's  (already bordeline today at 76)  Sats at peak ex Advised: Make sure you check your oxygen saturation at your highest level of activity(NOT after you stop)  to be sure it stays over 90% and keep track of it at least once a week, more often if breathing getting worse, and let me know if losing ground. (Collect the dots to connect the dots approach)      Hemoptysis Former smoker (quit 1988) with  onset with chf 01/2023  - CTa chest  01/28/23 Central airways are patent. Mild centrilobular emphysema. No consolidation, pleural effusion or pneumothorax. - eliquis stopped 09/08/2023   Most likely this is from chf/eliquis wit persistently elevated LVEDP but if continues off eliquis perhaps could consider FOR as CT scanning is unlikely to detect small endobronchial or upper airway sources.   F/u  4 weeks, sooner prn   Each maintenance medication was reviewed in detail including emphasizing most importantly the difference between maintenance and prns and under what circumstances the prns are to be triggered using an action plan format where appropriate.  Total time for H and P, chart review, counseling,  directly observing portions of ambulatory 02 saturation study/ and generating customized AVS unique to this office visit / same day charting = 60 min complex new pt eval.                   Sandrea Hughs, MD 09/09/2023

## 2023-09-08 NOTE — Patient Instructions (Signed)
 Please remember to go to the  x-ray department  for your tests - we will call you with the results when they are available    Please remember to go to the lab department   for your tests - we will call you with the results when they are available.  Please schedule a follow up office visit in 4 weeks, sooner if needed      .

## 2023-09-09 ENCOUNTER — Telehealth: Payer: Self-pay | Admitting: Internal Medicine

## 2023-09-09 NOTE — Assessment & Plan Note (Addendum)
 Quit smoking in 1998 /onset of doe assoc with heart dz summer 2024  - Amiodarone started 01/31/23  - Echo 07/07/23 LV EF35 to 40% moderately decreased function. Elevated left  ventricular end-diastolic pressure.  - Needing 02 at rehab since 08/30/23  - 09/08/2023   Walked on RA  x  3   lap(s) =  approx 750  ft  @ moderate pace, stopped due to end of study  with lowest 02 sats 90% and no sob /cp    She clearly has ongoing cardiac issues per past cards note with last bnp 866 and echo above but tol PT ok, albeit with 02 at high levels.    She is however at risk of amio toxicity : Patients typically have been on amiodarone for 6-12 months before this complication manifests.  Of note, serial clinical evaluation for symptoms such as cough dyspnea or fevers is  the preferred method of monitoring for pulmonary toxicity because a decrease in DLCO or lung volumes is a nonspecific for toxicity. Pathologically amiodarone pulmonary toxicity may appear as interstitial pneumonitis, eosinophilic pneumonia, organizing pneumonia, pulmonary fibrosis or less commonly as diffuse alveolar hemorrhage, pulmonary nodules or pleural effusions.  Risk factors for pulmonary toxicity include age greater than 60, daily dose greater than equal to 400 mg, a high cumulative dose, or pre-existing lung disease    Best way to screen  Serial ESR's  (already bordeline today at 54)   Monitor TSH as well in setting of amiodarone exp  Sats at peak ex  Advised: Make sure you check your oxygen saturation at your highest level of activity(NOT after you stop)  to be sure it stays over 90% and keep track of it at least once a week, more often if breathing getting worse, and let me know if losing ground. (Collect the dots to connect the dots approach)

## 2023-09-09 NOTE — Telephone Encounter (Signed)
 Madison Cowden, MD to Me     09/09/23 12:27 PM  I can't evaluate new onset chest pain like that over the phone, rec go to ER   where they will probably want to do some kind of scan  I spoke with her and made her aware of response from Dr Sherene Sires  She verbalized understanding  She says now the pain has subsided and if it returns will go to ED

## 2023-09-09 NOTE — Telephone Encounter (Signed)
 Dr Sherene Sires- I called the pt and reviewed labs with her  She is asking for cxr results- I made her aware that the report is not in, but she wants to have you look at it now  \ She also c/o waking up with sharp CP on the right- never had this pain before and worse with taking a deep breath  She took tylenol and using heating pad without relief  She has not called her PCP "I have only seen them twice"  No other new symptoms since just seen here yeserday  Please advise, thanks

## 2023-09-09 NOTE — Progress Notes (Signed)
 Spoke with pt and notified of results per Dr. Sherene Sires. Pt verbalized understanding. Copy routed to cards.

## 2023-09-09 NOTE — Assessment & Plan Note (Signed)
 Former smoker (quit 1988) with onset with chf 01/2023  - CTa chest  01/28/23 Central airways are patent. Mild centrilobular emphysema. No consolidation, pleural effusion or pneumothorax. - eliquis stopped 09/08/2023   Most likely this is from chf/eliquis wit persistently elevated LVEDP but if continues off eliquis perhaps could consider FOR as CT scanning is unlikely to detect small endobronchial or upper airway sources.   F/u  4 weeks, sooner prn   Each maintenance medication was reviewed in detail including emphasizing most importantly the difference between maintenance and prns and under what circumstances the prns are to be triggered using an action plan format where appropriate.  Total time for H and P, chart review, counseling,  directly observing portions of ambulatory 02 saturation study/ and generating customized AVS unique to this office visit / same day charting = 60 min complex new pt eval.

## 2023-09-10 ENCOUNTER — Encounter: Payer: Self-pay | Admitting: Internal Medicine

## 2023-09-10 ENCOUNTER — Encounter (HOSPITAL_COMMUNITY): Payer: Medicare Other

## 2023-09-13 ENCOUNTER — Encounter (HOSPITAL_COMMUNITY)
Admission: RE | Admit: 2023-09-13 | Discharge: 2023-09-13 | Disposition: A | Discharge status: Home / Self Care | Payer: Medicare Other | Source: Ambulatory Visit | Attending: Cardiology | Admitting: Cardiology

## 2023-09-13 DIAGNOSIS — Z952 Presence of prosthetic heart valve: Secondary | ICD-10-CM

## 2023-09-13 DIAGNOSIS — I2102 ST elevation (STEMI) myocardial infarction involving left anterior descending coronary artery: Secondary | ICD-10-CM

## 2023-09-13 DIAGNOSIS — Z955 Presence of coronary angioplasty implant and graft: Secondary | ICD-10-CM

## 2023-09-13 NOTE — Progress Notes (Signed)
 Medication changes noted weight today is 81.6 kg which is down 4.7 kg since last exercise session. Will continue to monitor.Thayer Headings RN BSN

## 2023-09-14 ENCOUNTER — Telehealth: Payer: Self-pay | Admitting: Cardiology

## 2023-09-14 DIAGNOSIS — E039 Hypothyroidism, unspecified: Secondary | ICD-10-CM

## 2023-09-14 LAB — ALPHA-1-ANTITRYPSIN PHENOTYP: A-1 Antitrypsin: 158 mg/dL (ref 101–187)

## 2023-09-14 NOTE — Telephone Encounter (Signed)
 It has been >6 months since her VT/VF that occurred in the setting of severe aortic stenosis and coronary artery disease.  I think we can safely stop amiodarone now.  That should help her thyroid function normalized.  Thanks MJP

## 2023-09-14 NOTE — Addendum Note (Signed)
 Addended by: Bertram Millard on: 09/14/2023 04:39 PM   Modules accepted: Orders

## 2023-09-14 NOTE — Telephone Encounter (Signed)
 Pt has been advised Dr Lynwood Dawley' recommendation and verbalized understanding.

## 2023-09-15 ENCOUNTER — Encounter (HOSPITAL_COMMUNITY)
Admission: RE | Admit: 2023-09-15 | Discharge: 2023-09-15 | Disposition: A | Discharge status: Home / Self Care | Payer: Medicare Other | Source: Ambulatory Visit | Attending: Cardiology

## 2023-09-15 DIAGNOSIS — Z952 Presence of prosthetic heart valve: Secondary | ICD-10-CM

## 2023-09-15 DIAGNOSIS — I2102 ST elevation (STEMI) myocardial infarction involving left anterior descending coronary artery: Secondary | ICD-10-CM

## 2023-09-15 DIAGNOSIS — Z955 Presence of coronary angioplasty implant and graft: Secondary | ICD-10-CM

## 2023-09-17 ENCOUNTER — Encounter (HOSPITAL_COMMUNITY): Payer: Medicare Other

## 2023-09-20 ENCOUNTER — Encounter (HOSPITAL_COMMUNITY)
Admission: RE | Admit: 2023-09-20 | Discharge: 2023-09-20 | Disposition: A | Discharge status: Home / Self Care | Payer: Medicare Other | Source: Ambulatory Visit | Attending: Cardiology

## 2023-09-20 DIAGNOSIS — Z955 Presence of coronary angioplasty implant and graft: Secondary | ICD-10-CM

## 2023-09-20 DIAGNOSIS — Z952 Presence of prosthetic heart valve: Secondary | ICD-10-CM | POA: Diagnosis not present

## 2023-09-20 DIAGNOSIS — I2102 ST elevation (STEMI) myocardial infarction involving left anterior descending coronary artery: Secondary | ICD-10-CM

## 2023-09-21 NOTE — Progress Notes (Signed)
 Cardiac Individual Treatment Plan  Patient Details  Name: Madison West MRN: [id number] Date of Birth: [birth year] Referring Provider:   Flowsheet Row INTENSIVE CARDIAC REHAB ORIENT from 07/26/2023 in [facility]  Referring Provider Truett Mainland, MD       Initial Encounter Date:  Flowsheet Row INTENSIVE CARDIAC REHAB ORIENT from 07/26/2023 in [facility] for Heart, Vascular, & Lung Health  Date 07/26/23       Visit Diagnosis: 02/04/23 S/P TAVR (transcatheter aortic valve replacement)  02/01/23 STEMI  01/29/23 DES LAD  Patient's Home Medications on Admission:  Current Outpatient Medications:    acetaminophen (TYLENOL) 325 MG tablet, Take 2 tablets (650 mg total) by mouth every 6 (six) hours. (Patient taking differently: Take 500 mg by mouth every 6 (six) hours.), Disp: , Rfl:    alprazolam (XANAX) 2 MG tablet, Take 0.25-2 mg by mouth See admin instructions. 1 mg (may take up to 2 mg) twice daily. May also take 0.25 mg during the day if needed for anxiety., Disp: , Rfl:    aspirin EC 81 MG tablet, Take 1 tablet (81 mg total) by mouth daily. Swallow whole., Disp: , Rfl:    busPIRone (BUSPAR) 10 MG tablet, TAKE 1 TABLET BY MOUTH TWICE A DAY (Patient taking differently: Take 5 mg by mouth 2 (two) times daily.), Disp: 180 tablet, Rfl: 1   clopidogrel (PLAVIX) 75 MG tablet, Take 1 tablet (75 mg total) by mouth daily., Disp: 90 tablet, Rfl: 3   dapagliflozin propanediol (FARXIGA) 10 MG TABS tablet, Take 1 tablet (10 mg total) by mouth daily before breakfast., Disp: 90 tablet, Rfl: 3   ezetimibe (ZETIA) 10 MG tablet, Take 1 tablet (10 mg total) by mouth daily. (Patient not taking: Reported on 09/08/2023), Disp: 90 tablet, Rfl: 3   furosemide (LASIX) 20 MG tablet, Take 0.5 tablets (10 mg total) by mouth daily as needed (for lower extremity swelling and weight gain)., Disp: 45 tablet, Rfl: 1   losartan  (COZAAR) 25 MG tablet, Take 1 tablet (25 mg total) by mouth daily., Disp: 90 tablet, Rfl: 3   methocarbamol (ROBAXIN) 500 MG tablet, Take 1 tablet (500 mg total) by mouth 2 (two) times daily., Disp: 60 tablet, Rfl: 0   nitroGLYCERIN (NITROSTAT) 0.4 MG SL tablet, Place 1 tablet (0.4 mg total) under the tongue every 5 (five) minutes as needed for chest pain. (Patient not taking: Reported on 09/08/2023), Disp: 25 tablet, Rfl: 2   oxyCODONE (ROXICODONE) 5 MG immediate release tablet, Take 1 tablet (5 mg total) by mouth daily as needed for severe pain (pain score 7-10)., Disp: 30 tablet, Rfl: 0   rosuvastatin (CRESTOR) 20 MG tablet, Take 0.5 tablets (10 mg total) by mouth daily., Disp: , Rfl:    spironolactone (ALDACTONE) 25 MG tablet, Take 0.5 tablets (12.5 mg total) by mouth daily., Disp: 45 tablet, Rfl: 3  Past Medical History: Past Medical History:  Diagnosis Date   Anxiety    Arthritis    low back and hip pain intermittent   Breast cancer (HCC) 06/02/07   r breast -surgery ,radiaology. chemotherapy   Carpal tunnel syndrome    right hand   Colon polyps    Complication of anesthesia    Fentanyl, Versed-makes extra hyper, bradycardia x 1 in PACU, [facility] (08/15/11 cardiology felt neostigmine may have resulted in AV nodal block)    Coronary artery disease    Depression    denies  Dysplasia of vulva    Hypertension    Palpitations    PSVT, s/p adenosine 08/04/16   S/P breast lumpectomy 07/04/07   R breast   S/P radiation therapy 2009    Tobacco Use: Social History   Tobacco Use  Smoking Status Former   Current packs/day: 0.00   Average packs/day: 0.8 packs/day for 20.0 years (15.0 ttl pk-yrs)   Types: Cigarettes   Start date: 05/06/1977   Quit date: 05/06/1997   Years since quitting: 26.3  Smokeless Tobacco Never    Labs: Review Flowsheet  More data exists      Latest Ref Rng & Units 02/12/2023 02/13/2023 02/14/2023 06/21/2023 09/02/2023  Labs for ITP Cardiac and Pulmonary Rehab   Cholestrol 100 - 199 mg/dL - - - 161  096   LDL (calc) 0 - 99 mg/dL - - - 94  71   HDL-C >04 mg/dL - - - 67  54   Trlycerides 0 - 149 mg/dL - - - 540  99   O2 Saturation % 76.1  68.3  60.4  - -    Capillary Blood Glucose: Lab Results  Component Value Date   GLUCAP 87 02/19/2023   GLUCAP 103 (H) 02/19/2023   GLUCAP 121 (H) 02/18/2023   GLUCAP 111 (H) 02/18/2023   GLUCAP 110 (H) 02/17/2023     Exercise Target Goals: Exercise Program Goal: Individual exercise prescription set using results from initial 6 min walk test and THRR while considering  patient's activity barriers and safety.   Exercise Prescription Goal: Initial exercise prescription builds to 30-45 minutes a day of aerobic activity, 2-3 days per week.  Home exercise guidelines will be given to patient during program as part of exercise prescription that the participant will acknowledge.  Activity Barriers & Risk Stratification:  Activity Barriers & Cardiac Risk Stratification - 07/26/23 1419       Activity Barriers & Cardiac Risk Stratification   Activity Barriers Deconditioning;Back Problems;Neck/Spine Problems;Balance Concerns;Decreased Ventricular Function    Cardiac Risk Stratification High   <5 METs on            6 Minute Walk:  6 Minute Walk     Row Name 07/26/23 1619         6 Minute Walk   Phase Initial     Distance 1180 feet     Walk Time 6 minutes     # of Rest Breaks 1  2:20-3:20 3/10 hip pain     MPH 2.23     METS 2.6     RPE 10     Perceived Dyspnea  1     VO2 Peak 9.11     Symptoms Yes (comment)     Comments Pt SpO2 down to 84%, recovered to 94% in around a minute and maintained. RPD = 1, resolved with rest. 6/10 Hip pain at end of     Resting HR 68 bpm     Resting BP 112/60     Resting Oxygen Saturation  92 %     Exercise Oxygen Saturation  during 6 min walk 84 %     Max Ex. HR 100 bpm     Max Ex. BP 136/66     2 Minute Post BP 120/62              Oxygen  Initial Assessment:   Oxygen Re-Evaluation:   Oxygen Discharge (Final Oxygen Re-Evaluation):   Initial Exercise Prescription:  Initial Exercise Prescription - 07/26/23 1600  Date of Initial Exercise RX and Referring Provider   Date 07/26/23    Referring Provider Truett Mainland, MD    Expected Discharge Date 10/20/23      Recumbant Bike   Level 1    RPM 50    Watts 30    Minutes 15    METs 2      NuStep   Level 1    SPM 60    Minutes 15    METs 2.5      Prescription Details   Frequency (times per week) 3    Duration Progress to 30 minutes of continuous aerobic without signs/symptoms of physical distress      Intensity   THRR 40-80% of Max Heartrate 60-121    Ratings of Perceived Exertion 11-13    Perceived Dyspnea 0-4      Progression   Progression Continue progressive overload as per policy without signs/symptoms or physical distress.      Resistance Training   Training Prescription Yes    Weight 2    Reps 10-15             Perform Capillary Blood Glucose checks as needed.  Exercise Prescription Changes:   Exercise Prescription Changes     Row Name 07/28/23 1400 08/18/23 1500 09/13/23 1500         Response to Exercise   Blood Pressure (Admit) 128/68 122/70 106/70     Blood Pressure (Exercise) 138/78 144/70 --     Blood Pressure (Exit) 112/70 100/70 94/60     Heart Rate (Admit) 75 bpm 82 bpm 73 bpm     Heart Rate (Exercise) 102 bpm 107 bpm 89 bpm     Heart Rate (Exit) 81 bpm 81 bpm 78 bpm     Oxygen Saturation (Admit) 87 % -- 91 %     Oxygen Saturation (Exercise) 89 % 91 %  2 L O2 via n/c 92 %     Oxygen Saturation (Exit) 94 % 93 % 90 %     Rating of Perceived Exertion (Exercise) 11 13 11      Perceived Dyspnea (Exercise) 0.5 1 1      Symptoms SOB SOB SOB, RPD = 1     Comments Pt's first day in the CRP2 program Reviewed METs Reviewed METs and goals     Duration Continue with 30 min of aerobic exercise without signs/symptoms of  physical distress. Continue with 30 min of aerobic exercise without signs/symptoms of physical distress. Continue with 30 min of aerobic exercise without signs/symptoms of physical distress.     Intensity THRR unchanged THRR unchanged THRR unchanged       Progression   Progression Continue to progress workloads to maintain intensity without signs/symptoms of physical distress. Continue to progress workloads to maintain intensity without signs/symptoms of physical distress. Continue to progress workloads to maintain intensity without signs/symptoms of physical distress.     Average METs 2.15 2.4 2.4       Resistance Training   Training Prescription No No Yes     Weight No weights on wednesdays No weights on wednesdays 3 lb     Reps -- -- 10-15     Time -- -- 10 Minutes       Interval Training   Interval Training No No No       Recumbant Bike   Level 1 1 1      RPM 62 70 77     Watts 16 16 21  Minutes 15 15 15      METs 2 2 2.2       NuStep   Level 1 1 1      SPM 84 95 85     Minutes 15 15 15      METs 2.3 2.8 2.6              Exercise Comments:   Exercise Comments     Row Name 07/28/23 1415 08/18/23 1400 09/13/23 1500       Exercise Comments Pt's first day in the CRP2 program. Pt did have some mild SOB with exercise (RPD = .5). Will continue to montior and progress as tolerated. Reviwed METs. Pt is making slow progress. Order for O2 with exercise was obtained due to patient's drop in SaO2 during exercise. Reviewed METs and goals. Slow progress. Will encourage increase in exercise workloads.              Exercise Goals and Review:   Exercise Goals     Row Name 07/26/23 1623             Exercise Goals   Increase Physical Activity Yes       Intervention Provide advice, education, support and counseling about physical activity/exercise needs.;Develop an individualized exercise prescription for aerobic and resistive training based on initial evaluation findings,  risk stratification, comorbidities and participant's personal goals.       Expected Outcomes Short Term: Attend rehab on a regular basis to increase amount of physical activity.;Long Term: Exercising regularly at least 3-5 days a week.;Long Term: Add in home exercise to make exercise part of routine and to increase amount of physical activity.       Increase Strength and Stamina Yes       Intervention Develop an individualized exercise prescription for aerobic and resistive training based on initial evaluation findings, risk stratification, comorbidities and participant's personal goals.;Provide advice, education, support and counseling about physical activity/exercise needs.       Expected Outcomes Short Term: Increase workloads from initial exercise prescription for resistance, speed, and METs.;Short Term: Perform resistance training exercises routinely during rehab and add in resistance training at home;Long Term: Improve cardiorespiratory fitness, muscular endurance and strength as measured by increased METs and functional capacity ( )       Able to understand and use rate of perceived exertion (RPE) scale Yes       Intervention Provide education and explanation on how to use RPE scale       Expected Outcomes Short Term: Able to use RPE daily in rehab to express subjective intensity level;Long Term:  Able to use RPE to guide intensity level when exercising independently       Knowledge and understanding of Target Heart Rate Range (THRR) Yes       Intervention Provide education and explanation of THRR including how the numbers were predicted and where they are located for reference       Expected Outcomes Short Term: Able to state/look up THRR;Short Term: Able to use daily as guideline for intensity in rehab;Long Term: Able to use THRR to govern intensity when exercising independently       Understanding of Exercise Prescription Yes       Intervention Provide education, explanation, and written  materials on patient's individual exercise prescription       Expected Outcomes Short Term: Able to explain program exercise prescription;Long Term: Able to explain home exercise prescription to exercise independently  Exercise Goals Re-Evaluation :  Exercise Goals Re-Evaluation     Row Name 07/28/23 1414 09/13/23 1500           Exercise Goal Re-Evaluation   Exercise Goals Review Increase Physical Activity;Increase Strength and Stamina;Able to understand and use rate of perceived exertion (RPE) scale;Knowledge and understanding of Target Heart Rate Range (THRR);Understanding of Exercise Prescription Increase Physical Activity;Increase Strength and Stamina;Able to understand and use rate of perceived exertion (RPE) scale;Knowledge and understanding of Target Heart Rate Range (THRR);Understanding of Exercise Prescription      Comments Pt's first day in the CRP2 program. Pt understands the exercise RX, RPE sclae and THRR. Reviewed METs and goals. Pt voices progress on her goals of increased strength, stamina and energy. Pt has goal of weight loss and has lost 5.6 kg to date.      Expected Outcomes Will continue to monitor patient and progress exercise workloads as tolerated. Will continue to monitor patient and progress exercise workloads as tolerated.               Discharge Exercise Prescription (Final Exercise Prescription Changes):  Exercise Prescription Changes - 09/13/23 1500       Response to Exercise   Blood Pressure (Admit) 106/70    Blood Pressure (Exit) 94/60    Heart Rate (Admit) 73 bpm    Heart Rate (Exercise) 89 bpm    Heart Rate (Exit) 78 bpm    Oxygen Saturation (Admit) 91 %    Oxygen Saturation (Exercise) 92 %    Oxygen Saturation (Exit) 90 %    Rating of Perceived Exertion (Exercise) 11    Perceived Dyspnea (Exercise) 1    Symptoms SOB, RPD = 1    Comments Reviewed METs and goals    Duration Continue with 30 min of aerobic exercise without  signs/symptoms of physical distress.    Intensity THRR unchanged      Progression   Progression Continue to progress workloads to maintain intensity without signs/symptoms of physical distress.    Average METs 2.4      Resistance Training   Training Prescription Yes    Weight 3 lb    Reps 10-15    Time 10 Minutes      Interval Training   Interval Training No      Recumbant Bike   Level 1    RPM 77    Watts 21    Minutes 15    METs 2.2      NuStep   Level 1    SPM 85    Minutes 15    METs 2.6             Nutrition:  Target Goals: Understanding of nutrition guidelines, daily intake of sodium 1500mg , cholesterol 200mg , calories 30% from fat and 7% or less from saturated fats, daily to have 5 or more servings of fruits and vegetables.  Biometrics:  Pre Biometrics - 07/26/23 1416       Pre Biometrics   Waist Circumference 48 inches    Hip Circumference 51 inches    Waist to Hip Ratio 0.94 %    Triceps Skinfold 16 mm    % Body Fat 41.8 %    Grip Strength 20 kg    Flexibility 13.5 in    Single Leg Stand 11.25 seconds              Nutrition Therapy Plan and Nutrition Goals:  Nutrition Therapy & Goals - 08/24/23 1018  Nutrition Therapy   Diet Heart Healthy Diet    Drug/Food Interactions Statins/Certain Fruits      Personal Nutrition Goals   Nutrition Goal Patient to identify strategies for reducing cardiovascular risk by attending the [facility] education and nutrition series weekly.   goal in progress.   Personal Goal #2 Patient to improve diet quality by using the plate method as a guide for meal planning to include lean protein/plant protein, fruits, vegetables, whole grains, nonfat dairy as part of a well-balanced diet.   goal in progress.   Personal Goal #3 Patient to identify strategies for weight loss of 0.5-2.0# per week.   goal not met.   Comments Goals in progress. Madison West has medical history of hyperlipidemia, s/p TAVR, STEMI, CHF, HTN, CAD,  carotid artery stenosis. She continues to attend  the [facility] education and nutrition series regularly. She is motivated to lose weight to 170#; however, she has not lost weight since starting with our program. LDL remains above goal; she continues zetia, crestor and heart healthy diet modifications. Patient will benefit from participation in intensive cardiac rehab for nutrition, exercise, and lifestyle modification.      Intervention Plan   Intervention Prescribe, educate and counsel regarding individualized specific dietary modifications aiming towards targeted core components such as weight, hypertension, lipid management, diabetes, heart failure and other comorbidities.;Nutrition handout(s) given to patient.    Expected Outcomes Short Term Goal: Understand basic principles of dietary content, such as calories, fat, sodium, cholesterol and nutrients.;Long Term Goal: Adherence to prescribed nutrition plan.             Nutrition Assessments:  MEDIFICTS Score Key: >=70 Need to make dietary changes  40-70 Heart Healthy Diet <= 40 Therapeutic Level Cholesterol Diet    Picture Your Plate Scores: <16 Unhealthy dietary pattern with much room for improvement. 41-50 Dietary pattern unlikely to meet recommendations for good health and room for improvement. 51-60 More healthful dietary pattern, with some room for improvement.  >60 Healthy dietary pattern, although there may be some specific behaviors that could be improved.    Nutrition Goals Re-Evaluation:  Nutrition Goals Re-Evaluation     Row Name 08/24/23 1018             Goals   Current Weight 202 lb 6.1 oz (91.8 kg)       Comment LDL 94, Lpa 227       Expected Outcome Goals in progress. Madison West has medical history of hyperlipidemia, s/p TAVR, STEMI, CHF, HTN, CAD, carotid artery stenosis. She continues to attend the [facility] education and nutrition series regularly. She is motivated to lose weight to 170#; however, she has not lost  weight since starting with our program. LDL remains above goal; she continues zetia, crestor and heart healthy diet modifications. Patient will benefit from participation in intensive cardiac rehab for nutrition, exercise, and lifestyle modification.                Nutrition Goals Re-Evaluation:  Nutrition Goals Re-Evaluation     Row Name 08/24/23 1018             Goals   Current Weight 202 lb 6.1 oz (91.8 kg)       Comment LDL 94, Lpa 227       Expected Outcome Goals in progress. Madison West has medical history of hyperlipidemia, s/p TAVR, STEMI, CHF, HTN, CAD, carotid artery stenosis. She continues to attend the [facility] education and nutrition series regularly. She is motivated to lose weight to 170#;  however, she has not lost weight since starting with our program. LDL remains above goal; she continues zetia, crestor and heart healthy diet modifications. Patient will benefit from participation in intensive cardiac rehab for nutrition, exercise, and lifestyle modification.                Nutrition Goals Discharge (Final Nutrition Goals Re-Evaluation):  Nutrition Goals Re-Evaluation - 08/24/23 1018       Goals   Current Weight 202 lb 6.1 oz (91.8 kg)    Comment LDL 94, Lpa 227    Expected Outcome Goals in progress. Madison West has medical history of hyperlipidemia, s/p TAVR, STEMI, CHF, HTN, CAD, carotid artery stenosis. She continues to attend the [facility] education and nutrition series regularly. She is motivated to lose weight to 170#; however, she has not lost weight since starting with our program. LDL remains above goal; she continues zetia, crestor and heart healthy diet modifications. Patient will benefit from participation in intensive cardiac rehab for nutrition, exercise, and lifestyle modification.             Psychosocial: Target Goals: Acknowledge presence or absence of significant depression and/or stress, maximize coping skills, provide positive support system.  Participant is able to verbalize types and ability to use techniques and skills needed for reducing stress and depression.  Initial Review & Psychosocial Screening:  Initial Psych Review & Screening - 07/26/23 1420       Initial Review   Current issues with Current Anxiety/Panic;Current Stress Concerns    Source of Stress Concerns Chronic Illness;Family    Comments Madison West shared that she has high levels of anxiety and stress due to the amount of tasks she has to do throughout her day. She feels the xanax is helping and denies any need for additional resources at this time. She has been very fatigued since her event. Both daughters and grandson live in her home, while they do offer support they are not helpful with house chores and this is stressful for [employer].      Family Dynamics   Good Support System? Yes   Madison West has her daughters and friends for support     Barriers   Psychosocial barriers to participate in program The patient should benefit from training in stress management and relaxation.      Screening Interventions   Interventions Provide feedback about the scores to participant;To provide support and resources with identified psychosocial needs;Encouraged to exercise    Expected Outcomes Long Term goal: The participant improves quality of Life and PHQ9 Scores as seen by post scores and/or verbalization of changes;Short Term goal: Identification and review with participant of any Quality of Life or Depression concerns found by scoring the questionnaire.;Long Term Goal: Stressors or current issues are controlled or eliminated.             Quality of Life Scores:  Quality of Life - 07/26/23 1623       Quality of Life   Select Quality of Life      Quality of Life Scores   Health/Function Pre 20 %    Socioeconomic Pre 26.57 %    Psych/Spiritual Pre 18.86 %    Family Pre 18 %    GLOBAL Pre 21.1 %            Scores of 19 and below usually indicate a poorer quality of life in  these areas.  A difference of  2-3 points is a clinically meaningful difference.  A difference of 2-3 points  in the total score of the Quality of Life Index has been associated with significant improvement in overall quality of life, self-image, physical symptoms, and general health in studies assessing change in quality of life.  PHQ-9: Review Flowsheet  More data exists      08/02/2023 07/26/2023 07/05/2023 05/10/2023 01/19/2020  Depression screen PHQ 2/9  Decreased Interest 0 1 0 0 0  Down, Depressed, Hopeless 0 0 0 0 0  PHQ - 2 Score 0 1 0 0 0  Altered sleeping - 2 - 0 -  Tired, decreased energy - 3 - 2 -  Change in appetite - 1 - 0 -  Feeling bad or failure about yourself  - 0 - 0 -  Trouble concentrating - 0 - 0 -  Moving slowly or fidgety/restless - 0 - 0 -  Suicidal thoughts - 0 - 0 -  PHQ-9 Score - 7 - 2 -  Difficult doing work/chores - Not difficult at all - Somewhat difficult -   Interpretation of Total Score  Total Score Depression Severity:  1-4 = Minimal depression, 5-9 = Mild depression, 10-14 = Moderate depression, 15-19 = Moderately severe depression, 20-27 = Severe depression   Psychosocial Evaluation and Intervention:   Psychosocial Re-Evaluation:  Psychosocial Re-Evaluation     Row Name 08/11/23 1191 08/19/23 1534 09/21/23 0955         Psychosocial Re-Evaluation   Current issues with Current Stress Concerns;Current Anxiety/Panic Current Stress Concerns;Current Anxiety/Panic Current Stress Concerns;Current Anxiety/Panic     Comments Reviewed quality of life and PHq2-9.Madison West says she is dissatisfied with her health due to her recent MI, Stenting and TAVR. Madison West says that her adult children who live with her do not help as they should. Madison West denies being depressed currently. Madison West takes xanax for anxiety. Madison West says that her anxiety is controlled. Madison West sees a psychiatrist once a year.  Offered emotional support and reassurance. Madison West continues to voice having family stress with  her live in children. Madison West also reports having stress about her respitory status as she is now wearing oxygen during exercise. Emotional support provided. Madison West continues to voice having family stress with her live in children. Emotional support provided     Expected Outcomes Madison West will have decreased or controlled anxiety/ stressors upon completion of cardiac rehab Madison West will have decreased or controlled anxiety/ stressors upon completion of cardiac rehab Madison West will have decreased or controlled anxiety/ stressors upon completion of cardiac rehab     Interventions Stress management education;Relaxation education;Encouraged to attend Cardiac Rehabilitation for the exercise Stress management education;Relaxation education;Encouraged to attend Cardiac Rehabilitation for the exercise Stress management education;Relaxation education;Encouraged to attend Cardiac Rehabilitation for the exercise     Continue Psychosocial Services  Follow up required by staff Follow up required by staff Follow up required by staff       Initial Review   Source of Stress Concerns Chronic Illness;Family Chronic Illness;Family Chronic Illness;Family     Comments Will continue to monitor and offer support as needed. Will continue to monitor and offer support as needed. Will continue to monitor and offer support as needed.              Psychosocial Discharge (Final Psychosocial Re-Evaluation):  Psychosocial Re-Evaluation - 09/21/23 0955       Psychosocial Re-Evaluation   Current issues with Current Stress Concerns;Current Anxiety/Panic    Comments Madison West continues to voice having family stress with her live in children. Emotional support provided    Expected Outcomes Madison West  will have decreased or controlled anxiety/ stressors upon completion of cardiac rehab    Interventions Stress management education;Relaxation education;Encouraged to attend Cardiac Rehabilitation for the exercise    Continue Psychosocial Services  Follow up required  by staff      Initial Review   Source of Stress Concerns Chronic Illness;Family    Comments Will continue to monitor and offer support as needed.             Vocational Rehabilitation: Provide vocational rehab assistance to qualifying candidates.   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 Participant will state understanding/return demonstration of topics presented.    Education     Row Name 07/28/23 1300     Education   Cardiac Education Topics Pritikin   Secondary school teacher School   Educator Respiratory Therapist;Nurse   Weekly Topic Powerhouse Plant-Based Proteins   Instruction Review Code 1- Verbalizes Understanding   Class Start Time 1150   Class Stop Time 1225   Class Time Calculation (min) 35 min    Row Name 08/09/23 1300     Education   Cardiac Education Topics Pritikin   Nurse, children's Exercise Physiologist   Select Psychosocial   Psychosocial Healthy Minds, Bodies, Hearts   Instruction Review Code 1- Verbalizes Understanding   Class Start Time 1155   Class Stop Time 1240   Class Time Calculation (min) 45 min    Row Name 08/16/23 1300     Education   Cardiac Education Topics Pritikin   Select Core Videos     Core Videos   Educator Nurse   Select Nutrition   Nutrition Becoming a Pritikin Chef   Instruction Review Code 1- Verbalizes Understanding   Class Start Time 1150   Class Stop Time 1226   Class Time Calculation (min) 36 min    Row Name 08/18/23 1300     Education   Cardiac Education Topics Pritikin   Secondary school teacher School   Educator Dietitian;Nurse   Weekly Topic  Personalizing Your Pritikin Plate   Instruction Review Code 1- Verbalizes Understanding   Class Start Time 1149   Class Stop Time 1233   Class Time Calculation (min) 44 min    Row Name 08/23/23 1600     Education   Cardiac Education Topics Pritikin   Select Workshops     Workshops   Educator Exercise Physiologist   Select Psychosocial   Psychosocial Workshop Recognizing and Reducing Stress   Instruction Review Code 1- Verbalizes Understanding   Class Start Time 1145   Class Stop Time 1228   Class Time Calculation (min) 43 min    Row Name 08/25/23 1400     Education   Cardiac Education Topics Pritikin   Orthoptist   Educator Dietitian   Weekly Topic Delicious Desserts   Instruction Review Code 1- Verbalizes Understanding   Class Start Time 1145   Class Stop Time 1222   Class Time Calculation (min) 37 min    Row Name 08/30/23 1300  Education   Cardiac Education Topics Pritikin   Geographical information systems officer Exercise   Exercise Workshop Exercise Basics: Diplomatic Services operational officer   Instruction Review Code 1- Verbalizes Understanding   Class Start Time 1216   Class Stop Time 1235   Class Time Calculation (min) 19 min    Row Name 09/01/23 1600     Education   Cardiac Education Topics Pritikin   Customer service manager   Weekly Topic Efficiency Cooking - Meals in a Snap   Instruction Review Code 1- Verbalizes Understanding   Class Start Time 1145   Class Stop Time 1225   Class Time Calculation (min) 40 min    Row Name 09/13/23 1300     Education   Cardiac Education Topics Pritikin   [employer]     Workshops   Educator Exercise Physiologist   Select Psychosocial   Psychosocial Workshop Focused Goals, Sustainable Changes   Instruction Review Code 1- Verbalizes Understanding   Class Start Time 1144   Class Stop Time 1218   Class  Time Calculation (min) 34 min    Row Name 09/15/23 1300     Education   Cardiac Education Topics Pritikin   Customer service manager   Weekly Topic Comforting Weekend Breakfasts   Instruction Review Code 1- Verbalizes Understanding   Class Start Time 1145   Class Stop Time 1228   Class Time Calculation (min) 43 min    Row Name 09/20/23 1200     Education   Cardiac Education Topics Pritikin   Select Core Videos     Core Videos   Educator Exercise Physiologist   Select Exercise Education   Exercise Education Biomechanial Limitations   Instruction Review Code 1- Verbalizes Understanding   Class Start Time 1145   Class Stop Time 1218   Class Time Calculation (min) 33 min            Core Videos: Exercise    Move It!  Clinical staff conducted group or individual video education with verbal and written material and guidebook.  Patient learns the recommended Pritikin exercise program. Exercise with the goal of living a long, healthy life. Some of the health benefits of exercise include controlled diabetes, healthier blood pressure levels, improved cholesterol levels, improved heart and lung capacity, improved sleep, and better body composition. Everyone should speak with their doctor before starting or changing an exercise routine.  Biomechanical Limitations Clinical staff conducted group or individual video education with verbal and written material and guidebook.  Patient learns how biomechanical limitations can impact exercise and how we can mitigate and possibly overcome limitations to have an impactful and balanced exercise routine.  Body Composition Clinical staff conducted group or individual video education with verbal and written material and guidebook.  Patient learns that body composition (ratio of muscle mass to fat mass) is a key component to assessing overall fitness, rather than body weight alone. Increased fat mass,  especially visceral belly fat, can put [place] at increased risk for metabolic syndrome, type 2 diabetes, heart disease, and even death. It is recommended to combine diet and exercise (cardiovascular and resistance training) to improve your body composition. Seek guidance from your physician and exercise physiologist before implementing an exercise routine.  Exercise Action Plan Clinical staff conducted group or individual video education with verbal  and written material and guidebook.  Patient learns the recommended strategies to achieve and enjoy long-term exercise adherence, including variety, self-motivation, self-efficacy, and positive decision making. Benefits of exercise include fitness, good health, weight management, more energy, better sleep, less stress, and overall well-being.  Medical   Heart Disease Risk Reduction Clinical staff conducted group or individual video education with verbal and written material and guidebook.  Patient learns our heart is our most vital organ as it circulates oxygen, nutrients, white blood cells, and hormones throughout the entire body, and carries waste away. Data supports a plant-based eating plan like the Pritikin Program for its effectiveness in slowing progression of and reversing heart disease. The video provides a number of recommendations to address heart disease.   Metabolic Syndrome and Belly Fat  Clinical staff conducted group or individual video education with verbal and written material and guidebook.  Patient learns what metabolic syndrome is, how it leads to heart disease, and how one can reverse it and keep it from coming back. You have metabolic syndrome if you have 3 of the following 5 criteria: abdominal obesity, high blood pressure, high triglycerides, low HDL cholesterol, and high blood sugar.  Hypertension and Heart Disease Clinical staff conducted group or individual video education with verbal and written material and guidebook.  Patient  learns that high blood pressure, or hypertension, is very common in the [place]. Hypertension is largely due to excessive salt intake, but other important risk factors include being overweight, physical inactivity, drinking too much alcohol, smoking, and not eating enough potassium from fruits and vegetables. High blood pressure is a leading risk factor for heart attack, stroke, congestive heart failure, dementia, kidney failure, and premature death. Long-term effects of excessive salt intake include stiffening of the arteries and thickening of heart muscle and organ damage. Recommendations include ways to reduce hypertension and the risk of heart disease.  Diseases of Our Time - Focusing on Diabetes Clinical staff conducted group or individual video education with verbal and written material and guidebook.  Patient learns why the best way to stop diseases of our time is prevention, through food and other lifestyle changes. Medicine (such as prescription pills and surgeries) is often only a Band-Aid on the problem, not a long-term solution. Most common diseases of our time include obesity, type 2 diabetes, hypertension, heart disease, and cancer. The Pritikin Program is recommended and has been proven to help reduce, reverse, and/or prevent the damaging effects of metabolic syndrome.  Nutrition   Overview of the Pritikin Eating Plan  Clinical staff conducted group or individual video education with verbal and written material and guidebook.  Patient learns about the Pritikin Eating Plan for disease risk reduction. The Pritikin Eating Plan emphasizes a wide variety of unrefined, minimally-processed carbohydrates, like fruits, vegetables, whole grains, and legumes. Go, Caution, and Stop food choices are explained. Plant-based and lean animal proteins are emphasized. Rationale provided for low sodium intake for blood pressure control, low added sugars for blood sugar stabilization, and low added fats and  oils for coronary artery disease risk reduction and weight management.  Calorie Density  Clinical staff conducted group or individual video education with verbal and written material and guidebook.  Patient learns about calorie density and how it impacts the Pritikin Eating Plan. Knowing the characteristics of the food you choose will help you decide whether those foods will lead to weight gain or weight loss, and whether you want to consume more or less of them. Weight loss is usually  a side effect of the Pritikin Eating Plan because of its focus on low calorie-dense foods.  Label Reading  Clinical staff conducted group or individual video education with verbal and written material and guidebook.  Patient learns about the Pritikin recommended label reading guidelines and corresponding recommendations regarding calorie density, added sugars, sodium content, and whole grains.  Dining Out - Part 1  Clinical staff conducted group or individual video education with verbal and written material and guidebook.  Patient learns that restaurant meals can be sabotaging because they can be so high in calories, fat, sodium, and/or sugar. Patient learns recommended strategies on how to positively address this and avoid unhealthy pitfalls.  Facts on Fats  Clinical staff conducted group or individual video education with verbal and written material and guidebook.  Patient learns that lifestyle modifications can be just as effective, if not more so, as many medications for lowering your risk of heart disease. A Pritikin lifestyle can help to reduce your risk of inflammation and atherosclerosis (cholesterol build-up, or plaque, in the artery walls). Lifestyle interventions such as dietary choices and physical activity address the cause of atherosclerosis. A review of the types of fats and their impact on blood cholesterol levels, along with dietary recommendations to reduce fat intake is also included.  Nutrition  Action Plan  Clinical staff conducted group or individual video education with verbal and written material and guidebook.  Patient learns how to incorporate Pritikin recommendations into their lifestyle. Recommendations include planning and keeping personal health goals in mind as an important part of their success.  Healthy Mind-Set    Healthy Minds, Bodies, Hearts  Clinical staff conducted group or individual video education with verbal and written material and guidebook.  Patient learns how to identify when they are stressed. Video will discuss the impact of that stress, as well as the many benefits of stress management. Patient will also be introduced to stress management techniques. The way we think, act, and feel has an impact on our hearts.  How Our Thoughts Can Heal Our Hearts  Clinical staff conducted group or individual video education with verbal and written material and guidebook.  Patient learns that negative thoughts can cause depression and anxiety. This can result in negative lifestyle behavior and serious health problems. Cognitive behavioral therapy is an effective method to help control our thoughts in order to change and improve our emotional outlook.  Additional Videos:  Exercise    Improving Performance  Clinical staff conducted group or individual video education with verbal and written material and guidebook.  Patient learns to use a non-linear approach by alternating intensity levels and lengths of time spent exercising to help burn more calories and lose more body fat. Cardiovascular exercise helps improve heart health, metabolism, hormonal balance, blood sugar control, and recovery from fatigue. Resistance training improves strength, endurance, balance, coordination, reaction time, metabolism, and muscle mass. Flexibility exercise improves circulation, posture, and balance. Seek guidance from your physician and exercise physiologist before implementing an exercise routine  and learn your capabilities and proper form for all exercise.  Introduction to Yoga  Clinical staff conducted group or individual video education with verbal and written material and guidebook.  Patient learns about yoga, a discipline of the coming together of mind, breath, and body. The benefits of yoga include improved flexibility, improved range of motion, better posture and core strength, increased lung function, weight loss, and positive self-image. Yoga's heart health benefits include lowered blood pressure, healthier heart rate, decreased cholesterol and triglyceride  levels, improved immune function, and reduced stress. Seek guidance from your physician and exercise physiologist before implementing an exercise routine and learn your capabilities and proper form for all exercise.  Medical   Aging: Enhancing Your Quality of Life  Clinical staff conducted group or individual video education with verbal and written material and guidebook.  Patient learns key strategies and recommendations to stay in good physical health and enhance quality of life, such as prevention strategies, having an advocate, securing a Health Care Proxy and Power of Attorney, and keeping a list of medications and system for tracking them. It also discusses how to avoid risk for bone loss.  Biology of Weight Control  Clinical staff conducted group or individual video education with verbal and written material and guidebook.  Patient learns that weight gain occurs because we consume more calories than we burn (eating more, moving less). Even if your body weight is normal, you may have higher ratios of fat compared to muscle mass. Too much body fat puts you at increased risk for cardiovascular disease, heart attack, stroke, type 2 diabetes, and obesity-related cancers. In addition to exercise, following the Pritikin Eating Plan can help reduce your risk.  Decoding Lab Results  Clinical staff conducted group or individual video  education with verbal and written material and guidebook.  Patient learns that lab test reflects one measurement whose values change over time and are influenced by many factors, including medication, stress, sleep, exercise, food, hydration, pre-existing medical conditions, and more. It is recommended to use the knowledge from this video to become more involved with your lab results and evaluate your numbers to speak with your doctor.   Diseases of Our Time - Overview  Clinical staff conducted group or individual video education with verbal and written material and guidebook.  Patient learns that according to the CDC, 50% to 70% of chronic diseases (such as obesity, type 2 diabetes, elevated lipids, hypertension, and heart disease) are avoidable through lifestyle improvements including healthier food choices, listening to satiety cues, and increased physical activity.  Sleep Disorders Clinical staff conducted group or individual video education with verbal and written material and guidebook.  Patient learns how good quality and duration of sleep are important to overall health and well-being. Patient also learns about sleep disorders and how they impact health along with recommendations to address them, including discussing with a physician.  Nutrition  Dining Out - Part 2 Clinical staff conducted group or individual video education with verbal and written material and guidebook.  Patient learns how to plan ahead and communicate in order to maximize their dining experience in a healthy and nutritious manner. Included are recommended food choices based on the type of restaurant the patient is visiting.   Fueling a Banker conducted group or individual video education with verbal and written material and guidebook.  There is a strong connection between our food choices and our health. Diseases like obesity and type 2 diabetes are very prevalent and are in large-part due to  lifestyle choices. The Pritikin Eating Plan provides plenty of food and hunger-curbing satisfaction. It is easy to follow, affordable, and helps reduce health risks.  Menu Workshop  Clinical staff conducted group or individual video education with verbal and written material and guidebook.  Patient learns that restaurant meals can sabotage health goals because they are often packed with calories, fat, sodium, and sugar. Recommendations include strategies to plan ahead and to communicate with the manager, chef, or server to  help order a healthier meal.  Planning Your Eating Strategy  Clinical staff conducted group or individual video education with verbal and written material and guidebook.  Patient learns about the Pritikin Eating Plan and its benefit of reducing the risk of disease. The Pritikin Eating Plan does not focus on calories. Instead, it emphasizes high-quality, nutrient-rich foods. By knowing the characteristics of the foods, we choose, we can determine their calorie density and make informed decisions.  Targeting Your Nutrition Priorities  Clinical staff conducted group or individual video education with verbal and written material and guidebook.  Patient learns that lifestyle habits have a tremendous impact on disease risk and progression. This video provides eating and physical activity recommendations based on your personal health goals, such as reducing LDL cholesterol, losing weight, preventing or controlling type 2 diabetes, and reducing high blood pressure.  Vitamins and Minerals  Clinical staff conducted group or individual video education with verbal and written material and guidebook.  Patient learns different ways to obtain key vitamins and minerals, including through a recommended healthy diet. It is important to discuss all supplements you take with your doctor.   Healthy Mind-Set    Smoking Cessation  Clinical staff conducted group or individual video education with  verbal and written material and guidebook.  Patient learns that cigarette smoking and tobacco addiction pose a serious health risk which affects millions of people. Stopping smoking will significantly reduce the risk of heart disease, lung disease, and many forms of cancer. Recommended strategies for quitting are covered, including working with your doctor to develop a successful plan.  Culinary   Becoming a Set designer conducted group or individual video education with verbal and written material and guidebook.  Patient learns that cooking at home can be healthy, cost-effective, quick, and puts them in control. Keys to cooking healthy recipes will include looking at your recipe, assessing your equipment needs, planning ahead, making it simple, choosing cost-effective seasonal ingredients, and limiting the use of added fats, salts, and sugars.  Cooking - Breakfast and Snacks  Clinical staff conducted group or individual video education with verbal and written material and guidebook.  Patient learns how important breakfast is to satiety and nutrition through the entire day. Recommendations include key foods to eat during breakfast to help stabilize blood sugar levels and to prevent overeating at meals later in the day. Planning ahead is also a key component.  Cooking - Educational psychologist conducted group or individual video education with verbal and written material and guidebook.  Patient learns eating strategies to improve overall health, including an approach to cook more at home. Recommendations include thinking of animal protein as a side on your plate rather than center stage and focusing instead on lower calorie dense options like vegetables, fruits, whole grains, and plant-based proteins, such as beans. Making sauces in large quantities to freeze for later and leaving the skin on your vegetables are also recommended to maximize your experience.  Cooking - Healthy  Salads and Dressing Clinical staff conducted group or individual video education with verbal and written material and guidebook.  Patient learns that vegetables, fruits, whole grains, and legumes are the foundations of the Pritikin Eating Plan. Recommendations include how to incorporate each of these in flavorful and healthy salads, and how to create homemade salad dressings. Proper handling of ingredients is also covered. Cooking - Soups and [employer] - Soups and Desserts Clinical staff conducted group or individual video education with  verbal and written material and guidebook.  Patient learns that Pritikin soups and desserts make for easy, nutritious, and delicious snacks and meal components that are low in sodium, fat, sugar, and calorie density, while high in vitamins, minerals, and filling fiber. Recommendations include simple and healthy ideas for soups and desserts.   Overview     The Pritikin Solution Program Overview Clinical staff conducted group or individual video education with verbal and written material and guidebook.  Patient learns that the results of the Pritikin Program have been documented in more than 100 articles published in peer-reviewed journals, and the benefits include reducing risk factors for (and, in some cases, even reversing) high cholesterol, high blood pressure, type 2 diabetes, obesity, and more! An overview of the three key pillars of the Pritikin Program will be covered: eating well, doing regular exercise, and having a healthy mind-set.  WORKSHOPS  Exercise: Exercise Basics: Building Your Action Plan Clinical staff led group instruction and group discussion with PowerPoint presentation and patient guidebook. To enhance the learning environment the use of posters, models and videos may be added. At the conclusion of this workshop, patients will comprehend the difference between physical activity and exercise, as well as the benefits of  incorporating both, into their routine. Patients will understand the FITT (Frequency, Intensity, Time, and Type) principle and how to use it to build an exercise action plan. In addition, safety concerns and other considerations for exercise and cardiac rehab will be addressed by the presenter. The purpose of this lesson is to promote a comprehensive and effective weekly exercise routine in order to improve patients' overall level of fitness.   Managing Heart Disease: Your Path to a Healthier Heart Clinical staff led group instruction and group discussion with PowerPoint presentation and patient guidebook. To enhance the learning environment the use of posters, models and videos may be added.At the conclusion of this workshop, patients will understand the anatomy and physiology of the heart. Additionally, they will understand how Pritikin's three pillars impact the risk factors, the progression, and the management of heart disease.  The purpose of this lesson is to provide a high-level overview of the heart, heart disease, and how the Pritikin lifestyle positively impacts risk factors.  Exercise Biomechanics Clinical staff led group instruction and group discussion with PowerPoint presentation and patient guidebook. To enhance the learning environment the use of posters, models and videos may be added. Patients will learn how the structural parts of their bodies function and how these functions impact their daily activities, movement, and exercise. Patients will learn how to promote a neutral spine, learn how to manage pain, and identify ways to improve their physical movement in order to promote healthy living. The purpose of this lesson is to expose patients to common physical limitations that impact physical activity. Participants will learn practical ways to adapt and manage aches and pains, and to minimize their effect on regular exercise. Patients will learn how to maintain good posture  while sitting, walking, and lifting.  Balance Training and Fall Prevention  Clinical staff led group instruction and group discussion with PowerPoint presentation and patient guidebook. To enhance the learning environment the use of posters, models and videos may be added. At the conclusion of this workshop, patients will understand the importance of their sensorimotor skills (vision, proprioception, and the vestibular system) in maintaining their ability to balance as they age. Patients will apply a variety of balancing exercises that are appropriate for their current level of function. Patients will  understand the common causes for poor balance, possible solutions to these problems, and ways to modify their physical environment in order to minimize their fall risk. The purpose of this lesson is to teach patients about the importance of maintaining balance as they age and ways to minimize their risk of falling.  WORKSHOPS   Nutrition:  Fueling a Ship broker led group instruction and group discussion with PowerPoint presentation and patient guidebook. To enhance the learning environment the use of posters, models and videos may be added. Patients will review the foundational principles of the Pritikin Eating Plan and understand what constitutes a serving size in each of the food groups. Patients will also learn Pritikin-friendly foods that are better choices when away from home and review make-ahead meal and snack options. Calorie density will be reviewed and applied to three nutrition priorities: weight maintenance, weight loss, and weight gain. The purpose of this lesson is to reinforce (in a group setting) the key concepts around what patients are recommended to eat and how to apply these guidelines when away from home by planning and selecting Pritikin-friendly options. Patients will understand how calorie density may be adjusted for different weight management goals.  Mindful  Eating  Clinical staff led group instruction and group discussion with PowerPoint presentation and patient guidebook. To enhance the learning environment the use of posters, models and videos may be added. Patients will briefly review the concepts of the Pritikin Eating Plan and the importance of low-calorie dense foods. The concept of mindful eating will be introduced as well as the importance of paying attention to internal hunger signals. Triggers for non-hunger eating and techniques for dealing with triggers will be explored. The purpose of this lesson is to provide patients with the opportunity to review the basic principles of the Pritikin Eating Plan, discuss the value of eating mindfully and how to measure internal cues of hunger and fullness using the Hunger Scale. Patients will also discuss reasons for non-hunger eating and learn strategies to use for controlling emotional eating.  Targeting Your Nutrition Priorities Clinical staff led group instruction and group discussion with PowerPoint presentation and patient guidebook. To enhance the learning environment the use of posters, models and videos may be added. Patients will learn how to determine their genetic susceptibility to disease by reviewing their family history. Patients will gain insight into the importance of diet as part of an overall healthy lifestyle in mitigating the impact of genetics and other environmental insults. The purpose of this lesson is to provide patients with the opportunity to assess their personal nutrition priorities by looking at their family history, their own health history and current risk factors. Patients will also be able to discuss ways of prioritizing and modifying the Pritikin Eating Plan for their highest risk areas  Menu  Clinical staff led group instruction and group discussion with PowerPoint presentation and patient guidebook. To enhance the learning environment the use of posters, models and videos may  be added. Using menus brought in from [employer], or printed from [employer], patients will apply the Pritikin dining out guidelines that were presented in the [employer] video. Patients will also be able to practice these guidelines in a variety of provided scenarios. The purpose of this lesson is to provide patients with the opportunity to practice hands-on learning of the Pritikin Dining Out guidelines with actual menus and practice scenarios.  Label Reading Clinical staff led group instruction and group discussion with PowerPoint presentation and  patient guidebook. To enhance the learning environment the use of posters, models and videos may be added. Patients will review and discuss the Pritikin label reading guidelines presented in Pritikin's Label Reading Educational series video. Using fool labels brought in from local grocery stores and markets, patients will apply the label reading guidelines and determine if the packaged food meet the Pritikin guidelines. The purpose of this lesson is to provide patients with the opportunity to review, discuss, and practice hands-on learning of the Pritikin Label Reading guidelines with actual packaged food labels. Cooking School  Pritikin's [employer] are designed to teach patients ways to prepare quick, simple, and affordable recipes at home. The importance of nutrition's role in chronic disease risk reduction is reflected in its emphasis in the overall Pritikin program. By learning how to prepare essential core Pritikin Eating Plan recipes, patients will increase control over what they eat; be able to customize the flavor of foods without the use of added salt, sugar, or fat; and improve the quality of the food they consume. By learning a set of core recipes which are easily assembled, quickly prepared, and affordable, patients are more likely to prepare more healthy foods at home. These workshops focus on convenient  breakfasts, simple entres, side dishes, and desserts which can be prepared with minimal effort and are consistent with nutrition recommendations for cardiovascular risk reduction. Cooking [employer] are taught by a Armed forces logistics/support/administrative officer (RD) who has been trained by the [employer]. The chef or RD has a clear understanding of the importance of minimizing - if not completely eliminating - added fat, sugar, and sodium in recipes. Throughout the series of Cooking School Workshop sessions, patients will learn about healthy ingredients and efficient methods of cooking to build confidence in their capability to prepare    Cooking School weekly topics:  Adding Flavor- Sodium-Free  Fast and Healthy Breakfasts  Powerhouse Plant-Based Proteins  Satisfying Salads and Dressings  Simple Sides and Sauces  International Cuisine-Spotlight on the [employer] Zones  Delicious Desserts  Savory Soups  [employer] - Meals in a Astronomer Appetizers and Snacks  Comforting Weekend Breakfasts  One-Pot Wonders   Fast Evening Meals  Landscape architect Your Pritikin Plate  WORKSHOPS   Healthy Mindset (Psychosocial):  Focused Goals, Sustainable Changes Clinical staff led group instruction and group discussion with PowerPoint presentation and patient guidebook. To enhance the learning environment the use of posters, models and videos may be added. Patients will be able to apply effective goal setting strategies to establish at least one personal goal, and then take consistent, meaningful action toward that goal. They will learn to identify common barriers to achieving personal goals and develop strategies to overcome them. Patients will also gain an understanding of how our mind-set can impact our ability to achieve goals and the importance of cultivating a positive and growth-oriented mind-set. The purpose of this lesson is to provide patients with a deeper understanding of  how to set and achieve personal goals, as well as the tools and strategies needed to overcome common obstacles which may arise along the way.  From Head to Heart: The Power of a Healthy Outlook  Clinical staff led group instruction and group discussion with PowerPoint presentation and patient guidebook. To enhance the learning environment the use of posters, models and videos may be added. Patients will be able to recognize and describe the impact of emotions and mood on physical health. They will discover the  importance of self-care and explore self-care practices which may work for them. Patients will also learn how to utilize the 4 C's to cultivate a healthier outlook and better manage stress and challenges. The purpose of this lesson is to demonstrate to patients how a healthy outlook is an essential part of maintaining good health, especially as they continue their cardiac rehab journey.  Healthy Sleep for a Healthy Heart Clinical staff led group instruction and group discussion with PowerPoint presentation and patient guidebook. To enhance the learning environment the use of posters, models and videos may be added. At the conclusion of this workshop, patients will be able to demonstrate knowledge of the importance of sleep to overall health, well-being, and quality of life. They will understand the symptoms of, and treatments for, common sleep disorders. Patients will also be able to identify daytime and nighttime behaviors which impact sleep, and they will be able to apply these tools to help manage sleep-related challenges. The purpose of this lesson is to provide patients with a general overview of sleep and outline the importance of quality sleep. Patients will learn about a few of the most common sleep disorders. Patients will also be introduced to the concept of "sleep hygiene," and discover ways to self-manage certain sleeping problems through simple daily behavior changes. Finally, the workshop  will motivate patients by clarifying the links between quality sleep and their goals of heart-healthy living.   Recognizing and Reducing Stress Clinical staff led group instruction and group discussion with PowerPoint presentation and patient guidebook. To enhance the learning environment the use of posters, models and videos may be added. At the conclusion of this workshop, patients will be able to understand the types of stress reactions, differentiate between acute and chronic stress, and recognize the impact that chronic stress has on their health. They will also be able to apply different coping mechanisms, such as reframing negative self-talk. Patients will have the opportunity to practice a variety of stress management techniques, such as deep abdominal breathing, progressive muscle relaxation, and/or guided imagery.  The purpose of this lesson is to educate patients on the role of stress in their lives and to provide healthy techniques for coping with it.  Learning Barriers/Preferences:  Learning Barriers/Preferences - 07/26/23 1425       Learning Barriers/Preferences   Learning Barriers None    Learning Preferences Audio;Computer/Internet;Group Instruction;Skilled Demonstration;Verbal Instruction;Video;Written Material;Pictoral;Individual Instruction             Education Topics:  Knowledge Questionnaire Score:  Knowledge Questionnaire Score - 07/26/23 1425       Knowledge Questionnaire Score   Pre Score 24/24             Core Components/Risk Factors/Patient Goals at Admission:  Personal Goals and Risk Factors at Admission - 07/26/23 1432       Core Components/Risk Factors/Patient Goals on Admission    Weight Management Yes;Obesity;Weight Loss    Intervention Weight Management: Develop a combined nutrition and exercise program designed to reach desired caloric intake, while maintaining appropriate intake of nutrient and fiber, sodium and fats, and appropriate energy  expenditure required for the weight goal.;Weight Management: Provide education and appropriate resources to help participant work on and attain dietary goals.;Weight Management/Obesity: Establish reasonable short term and long term weight goals.;Obesity: Provide education and appropriate resources to help participant work on and attain dietary goals.    Goal Weight: Long Term 170 lb (77.1 kg)   pt goal   Heart Failure Yes  Intervention Provide a combined exercise and nutrition program that is supplemented with education, support and counseling about heart failure. Directed toward relieving symptoms such as shortness of breath, decreased exercise tolerance, and extremity edema.    Expected Outcomes Improve functional capacity of life;Short term: Attendance in program 2-3 days a week with increased exercise capacity. Reported lower sodium intake. Reported increased fruit and vegetable intake. Reports medication compliance.;Short term: Daily weights obtained and reported for increase. Utilizing diuretic protocols set by physician.;Long term: Adoption of self-care skills and reduction of barriers for early signs and symptoms recognition and intervention leading to self-care maintenance.    Hypertension Yes    Intervention Provide education on lifestyle modifcations including regular physical activity/exercise, weight management, moderate sodium restriction and increased consumption of fresh fruit, vegetables, and low fat dairy, alcohol moderation, and smoking cessation.;Monitor prescription use compliance.    Expected Outcomes Short Term: Continued assessment and intervention until BP is < 140/63mm HG in hypertensive participants. < 130/47mm HG in hypertensive participants with diabetes, heart failure or chronic kidney disease.;Long Term: Maintenance of blood pressure at goal levels.    Lipids Yes    Intervention Provide education and support for participant on nutrition & aerobic/resistive exercise along  with prescribed medications to achieve LDL 70mg , HDL >40mg .    Expected Outcomes Short Term: Participant states understanding of desired cholesterol values and is compliant with medications prescribed. Participant is following exercise prescription and nutrition guidelines.;Long Term: Cholesterol controlled with medications as prescribed, with individualized exercise RX and with personalized nutrition plan. Value goals: LDL < 70mg , HDL > 40 mg.    Stress Yes    Intervention Offer individual and/or small group education and counseling on adjustment to heart disease, stress management and health-related lifestyle change. Teach and support self-help strategies.;Refer participants experiencing significant psychosocial distress to appropriate mental health specialists for further evaluation and treatment. When possible, include family members and significant others in education/counseling sessions.    Expected Outcomes Short Term: Participant demonstrates changes in health-related behavior, relaxation and other stress management skills, ability to obtain effective social support, and compliance with psychotropic medications if prescribed.;Long Term: Emotional wellbeing is indicated by absence of clinically significant psychosocial distress or social isolation.             Core Components/Risk Factors/Patient Goals Review:   Goals and Risk Factor Review     Row Name 08/11/23 6578 08/19/23 1537 09/21/23 0958         Core Components/Risk Factors/Patient Goals Review   Personal Goals Review Weight Management/Obesity;Heart Failure;Lipids;Hypertension;Stress;Improve shortness of breath with ADL's Weight Management/Obesity;Heart Failure;Lipids;Hypertension;Stress;Improve shortness of breath with ADL's Weight Management/Obesity;Heart Failure;Lipids;Hypertension;Stress;Improve shortness of breath with ADL's     Review Tychelle started cardiac rehab on 07/28/23. Jessiah is off to a good start to exercise.  oxygen saturatiobns have been consistantly dropping into the mid 80's. Order obtained to use oxygen with exercise. Patient agreeable.  Will use oxygen on next scheduled visit. Kenedy is enjoying participating in cardiac rehab so far. Afreen is doing well with exercise at  cardiac rehab. Vital signs and oxygen saturations have been stable now that [place] is wearing oxygen with exercise. oxygen saturations have been in the low to mid 90's on 2l/min. Tasheka has an upcoming appointment with her pulmonolgist in March. Kiamesha is doing well with exercise at  cardiac rehab. Vital signs and oxygen saturations have been stable now that [place] is wearing oxygen with exercise. Oxygen saturations have remained in the low to mid 90's on 2l/min. Madison West  would benefit from participating in pulmonary rehab when she completes cardiac rehab. Madison West says she is more interested in water aerobics. Madison West has lost 1.7 kg since starting cardiac rehab.     Expected Outcomes Aracelli will continue to participate in cardiac rehab for exercise, nutrition and lifestyle modifications Akilah will continue to participate in cardiac rehab for exercise, nutrition and lifestyle modifications Deaven will continue to participate in cardiac rehab for exercise, nutrition and lifestyle modifications              Core Components/Risk Factors/Patient Goals at Discharge (Final Review):   Goals and Risk Factor Review - 09/21/23 0958       Core Components/Risk Factors/Patient Goals Review   Personal Goals Review Weight Management/Obesity;Heart Failure;Lipids;Hypertension;Stress;Improve shortness of breath with ADL's    Review Kianni is doing well with exercise at  cardiac rehab. Vital signs and oxygen saturations have been stable now that [place] is wearing oxygen with exercise. Oxygen saturations have remained in the low to mid 90's on 2l/min. [place] would benefit from participating in pulmonary rehab when she completes cardiac rehab. [place] says she is more  interested in water aerobics. [place] has lost 1.7 kg since starting cardiac rehab.    Expected Outcomes Keila will continue to participate in cardiac rehab for exercise, nutrition and lifestyle modifications             ITP Comments:  ITP Comments     Row Name 07/26/23 1417 08/19/23 1533 09/21/23 0954       ITP Comments Dr. Armanda Magic medical director. Introduction to pritikin education/intensive cardiac rehab. Initial orientation packet reviewed with patient. 30 Day ITP Review. Madison West has good attendance and participation with exercise at cardiac rehab. [place] is off to a good start to exercise for her fitness level. 30 Day ITP Review. [place] continues to have good attendance and participation with exercise at cardiac rehab.              Comments: See ITP comments.Thayer Headings RN BSN

## 2023-09-22 ENCOUNTER — Encounter (HOSPITAL_COMMUNITY)
Admission: RE | Admit: 2023-09-22 | Discharge: 2023-09-22 | Disposition: A | Discharge status: Home / Self Care | Payer: Medicare Other | Source: Ambulatory Visit | Attending: Cardiology | Admitting: Cardiology

## 2023-09-22 ENCOUNTER — Telehealth: Payer: Self-pay

## 2023-09-22 DIAGNOSIS — I2102 ST elevation (STEMI) myocardial infarction involving left anterior descending coronary artery: Secondary | ICD-10-CM | POA: Diagnosis present

## 2023-09-22 DIAGNOSIS — Z955 Presence of coronary angioplasty implant and graft: Secondary | ICD-10-CM | POA: Diagnosis present

## 2023-09-22 DIAGNOSIS — Z952 Presence of prosthetic heart valve: Secondary | ICD-10-CM | POA: Diagnosis present

## 2023-09-22 NOTE — Telephone Encounter (Signed)
 Copied from CRM [id number]. Topic: Clinical - Lab/Test Results >> Sep 21, 2023  2:52 PM Isabell A wrote: Reason for CRM: Patient states she has been waiting for 3 weeks for her x-ray results - she is concerned and would like a call back.  Callback number: [phone]  ATC pt X1. LMTCB  Per result note from Dr Sherene Sires, " Reviewed cxr and no acute change so no change in recommendations made at ov "  This can be seen on result note.

## 2023-09-22 NOTE — Progress Notes (Signed)
Reviewed home exercise Rx with patient today.  Encouraged warm-up, cool-down, and stretching. Reviewed THRR of  60 - 121 and keeping RPE between 11-13. Encouraged to hydrate with activity.  Reviewed weather parameters for temperature and humidity for safe exercise outdoors. Reviewed S/S to terminate exercise and when to call 911 vs MD. Reviewed the use of NTG and pt was encouraged to carry at all times. Pt encouraged to always carry a cell phone for safety when exercising outdoors. Pt verbalized understanding of the home exercise Rx and was provided a copy.   Sasan Wilkie MS, ACSM-CEP, CCRP  

## 2023-09-23 NOTE — Telephone Encounter (Signed)
 Per Dr. Sherene Sires please schedule with  Francine Graven / Everardo All / [facility] / Celine Mans In first available  Thank you!

## 2023-09-23 NOTE — Telephone Encounter (Signed)
 Add: It may well be the next step is bronchoscopy and I can't offer that to her any way so set her up with Hunsucker or Ellison in [place] since they might be able to see her quicker and offer more than I can

## 2023-09-24 ENCOUNTER — Encounter (HOSPITAL_COMMUNITY): Payer: Medicare Other

## 2023-09-27 ENCOUNTER — Encounter (HOSPITAL_COMMUNITY)
Admission: RE | Admit: 2023-09-27 | Discharge: 2023-09-27 | Disposition: A | Discharge status: Home / Self Care | Payer: Medicare Other | Source: Ambulatory Visit | Attending: Cardiology

## 2023-09-27 DIAGNOSIS — Z952 Presence of prosthetic heart valve: Secondary | ICD-10-CM

## 2023-09-27 DIAGNOSIS — I2102 ST elevation (STEMI) myocardial infarction involving left anterior descending coronary artery: Secondary | ICD-10-CM

## 2023-09-27 DIAGNOSIS — Z955 Presence of coronary angioplasty implant and graft: Secondary | ICD-10-CM

## 2023-09-28 ENCOUNTER — Telehealth: Payer: Self-pay | Admitting: Internal Medicine

## 2023-09-28 NOTE — Telephone Encounter (Signed)
 Called and spoke to patient.  She stated that she has been coughing up blood since getting off the vent during admission in Sept of 2024. Pt described blood to be the size of a nickel and sometimes a dime and occurs roughly 3-5 times daily.  She reports of occ SOB with exertion. Denied f/c/s or additional sx. Dr. Sherene Sires recommended that patient follow up with either Dr. Francine Graven, Dr. Judeth Horn or Dr. Celine Mans. The first available is 11/10/2023 with Dr. Judeth Horn. Pt is concerned and would like to be seen sooner.   Dr. Judeth Horn, please advise if patient can be seen sooner? Thanks

## 2023-09-28 NOTE — Telephone Encounter (Signed)
 Opened new tel encounter to [place]. No appts avail and PT was upset. Se encounter from 4/8

## 2023-09-28 NOTE — Telephone Encounter (Signed)
 I called PT per instructions as follows:  Per Dr. Sherene Sires please schedule with  Francine Graven / Everardo All / [facility] / Celine Mans In first available   She was upset she has to wait so long for an appt. States her issue (coughing up blood) has been going on a long time and that she did not feel the Dr. Marland Kitchenlistened to me". I have no sooner openings. She dcln sooner openings in [place]. I did not want to send a request toi all these Dr.'s. Please advise if any will take her sooner. Maybe Ms. Alexandria Lodge? TY

## 2023-09-29 ENCOUNTER — Encounter: Payer: Medicare Other | Admitting: Registered Nurse

## 2023-09-29 ENCOUNTER — Encounter (HOSPITAL_COMMUNITY)
Admission: RE | Admit: 2023-09-29 | Discharge: 2023-09-29 | Disposition: A | Discharge status: Home / Self Care | Payer: Medicare Other | Source: Ambulatory Visit | Attending: Cardiology

## 2023-09-29 DIAGNOSIS — Z955 Presence of coronary angioplasty implant and graft: Secondary | ICD-10-CM

## 2023-09-29 DIAGNOSIS — Z952 Presence of prosthetic heart valve: Secondary | ICD-10-CM | POA: Diagnosis not present

## 2023-09-29 DIAGNOSIS — I2102 ST elevation (STEMI) myocardial infarction involving left anterior descending coronary artery: Secondary | ICD-10-CM

## 2023-09-29 NOTE — Telephone Encounter (Signed)
 Probably should be seen sooner- she can see any doc or APP.

## 2023-09-30 ENCOUNTER — Encounter: Attending: Physical Medicine and Rehabilitation | Admitting: Registered Nurse

## 2023-09-30 ENCOUNTER — Encounter: Payer: Self-pay | Admitting: Registered Nurse

## 2023-09-30 VITALS — BP 110/79 | HR 79 | Ht 68.0 in | Wt 195.6 lb

## 2023-09-30 DIAGNOSIS — G8929 Other chronic pain: Secondary | ICD-10-CM | POA: Insufficient documentation

## 2023-09-30 DIAGNOSIS — G894 Chronic pain syndrome: Secondary | ICD-10-CM

## 2023-09-30 DIAGNOSIS — Z79891 Long term (current) use of opiate analgesic: Secondary | ICD-10-CM | POA: Diagnosis present

## 2023-09-30 DIAGNOSIS — M542 Cervicalgia: Secondary | ICD-10-CM

## 2023-09-30 DIAGNOSIS — M545 Low back pain, unspecified: Secondary | ICD-10-CM | POA: Diagnosis not present

## 2023-09-30 DIAGNOSIS — Z5181 Encounter for therapeutic drug level monitoring: Secondary | ICD-10-CM

## 2023-09-30 NOTE — Progress Notes (Signed)
 Subjective:    Patient ID: Madison West, female    DOB: 1955-07-07, 68 y.o.   MRN: [id number]  HPI: Madison West is a 68 y.o. female who returns for follow up appointment for chronic pain and medication refill. She states her pain is located in her neck and lower back pain. She rates her pain 2. Her current exercise regime is attending [facility] two days a week ,walking  short distances and performing stretching exercises.  Madison West Morphine equivalent is 7.50 MME.   Madison West reports her last Oxycodone was 2-3 weeks ago. She also reports she had a Margarita last night.   UDS ordered today.    Pain Inventory Average Pain 3 Pain Right Now 2 My pain is aching  In the last 24 hours, has pain interfered with the following? General activity 2 Relation with others 0 Enjoyment of life 2 What TIME of day is your pain at its worst? night Sleep (in general) Good  Pain is worse with: walking, bending, and some activites Pain improves with: heat/ice and medication Relief from Meds: 7  Family History  Problem Relation Age of Onset   Colon cancer Mother 75   Heart disease Father        heart attack   Colon polyps Brother        one brother had large colon polyp that needed surgery to be removed   Heart disease Paternal Grandfather    Diabetes type II Brother    Social History   Socioeconomic History   Marital status: Widowed    Spouse name: Not on file   Number of children: 2   Years of education: Not on file   Highest education level: Not on file  Occupational History   Occupation: server    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  Tobacco Use   Smoking status: Former    Current packs/day: 0.00    Average packs/day: 0.8 packs/day for 20.0 years (15.0 ttl pk-yrs)    Types: Cigarettes    Start date: 05/06/1977    Quit date: 05/06/1997    Years since quitting: 26.4   Smokeless tobacco: Never  Vaping Use   Vaping status: Never Used  Substance and  Sexual Activity   Alcohol use: Not Currently    Comment: has't had a drink in 9 weeks   Drug use: No   Sexual activity: Not Currently  Other Topics Concern   Not on file  Social History Narrative   Not on file   Social Drivers of Health   Financial Resource Strain: Low Risk  (03/10/2023)   Received from [facility]   Overall Financial Resource Strain (CARDIA)    Difficulty of Paying Living Expenses: Not hard at all  Food Insecurity: No Food Insecurity (03/10/2023)   Received from [facility]   Hunger Vital Sign    Worried About Running Out of Food in the Last Year: Never true    Ran Out of Food in the Last Year: Never true  Transportation Needs: No Transportation Needs (03/10/2023)   Received from [facility]    Lack of Transportation (Medical): No    Lack of Transportation (Non-Medical): No  Physical Activity: Not on file  Stress: Not on file  Social Connections: Unknown (03/05/2023)   [facility]   Social Network    Social Network: Not on file   Past Surgical History:  Procedure Laterality Date   APPENDECTOMY  age 75   CARDIAC CATHETERIZATION     CESAREAN SECTION     x 2   COLONOSCOPY W/ POLYPECTOMY     COLONOSCOPY WITH PROPOFOL N/A 04/27/2016   Procedure: COLONOSCOPY WITH PROPOFOL;  Surgeon: Charolett Bumpers, MD;  Location: [facility];  Service: Endoscopy;  Laterality: N/A;   CORONARY STENT INTERVENTION N/A 01/29/2023   Procedure: CORONARY STENT INTERVENTION;  Surgeon: Elder Negus, MD;  [facility]: [facility];  Service: Cardiovascular;  Laterality: N/A;   CORONARY THROMBECTOMY N/A 01/31/2023   Procedure: Coronary Thrombectomy;  Surgeon: Elder Negus, MD;  [facility];  Service: Cardiovascular;  Laterality: N/A;   CORONARY ULTRASOUND/IVUS N/A 01/31/2023   Procedure: Coronary Ultrasound/IVUS;  Surgeon: Elder Negus, MD;  [facility];  Service:  Cardiovascular;  Laterality: N/A;   CORONARY/GRAFT ACUTE MI REVASCULARIZATION N/A 01/31/2023   Procedure: Coronary/Graft Acute MI Revascularization;  Surgeon: Elder Negus, MD;  [facility];  Service: Cardiovascular;  Laterality: N/A;   DILATION AND CURETTAGE OF UTERUS     multiple   ENDARTERECTOMY Right 06/09/2019   ENDARTERECTOMY Right 06/09/2019   Procedure: ENDARTERECTOMY CAROTID RIGHT;  Surgeon: Sherren Kerns, MD;  Location: [facility] OR;  Service: Vascular;  Laterality: Right;   IABP INSERTION N/A 02/02/2023   Procedure: IABP Insertion;  Surgeon: Elder Negus, MD;  [facility];  Service: Cardiovascular;  Laterality: N/A;   INTRAUTERINE DEVICE INSERTION     IUD REMOVAL     LEFT HEART CATH AND CORONARY ANGIOGRAPHY N/A 02/27/2020   Procedure: LEFT HEART CATH AND CORONARY ANGIOGRAPHY;  Surgeon: Yates Decamp, MD;  [facility];  Service: Cardiovascular;  Laterality: N/A;   LEFT HEART CATH AND CORONARY ANGIOGRAPHY N/A 01/31/2023   Procedure: LEFT HEART CATH AND CORONARY ANGIOGRAPHY;  Surgeon: Elder Negus, MD;  [facility];  Service: Cardiovascular;  Laterality: N/A;   MASTECTOMY PARTIAL / LUMPECTOMY W/ AXILLARY LYMPHADENECTOMY Right    lumpectomy and lymph nodes removed   PATCH ANGIOPLASTY Right 06/09/2019   Procedure: Patch Angioplasty of right carotid artery using hemashield paltinum finesse patch;  Surgeon: Sherren Kerns, MD;  Location: [facility] OR;  Service: Vascular;  Laterality: Right;   RIGHT AND LEFT HEART CATH N/A 02/01/2023   Procedure: RIGHT AND LEFT HEART CATH;  Surgeon: Elder Negus, MD;  [facility];  Service: Cardiovascular;  Laterality: N/A;   RIGHT HEART CATH N/A 02/02/2023   Procedure: RIGHT HEART CATH;  Surgeon: Dolores Patty, MD;  [facility];  Service: Cardiovascular;  Laterality: N/A;   RIGHT/LEFT HEART CATH AND CORONARY ANGIOGRAPHY N/A  01/27/2023   Procedure: RIGHT/LEFT HEART CATH AND CORONARY ANGIOGRAPHY;  Surgeon: Elder Negus, MD;  [facility];  Service: Cardiovascular;  Laterality: N/A;   TEE WITHOUT CARDIOVERSION N/A 02/04/2023   Procedure: TRANSESOPHAGEAL ECHOCARDIOGRAM;  Surgeon: Orbie Pyo, MD;  Location: [facility] INVASIVE CV LAB;  Service: Open Heart Surgery;  Laterality: N/A;   TEMPORARY PACEMAKER N/A 02/02/2023   Procedure: TEMPORARY PACEMAKER;  Surgeon: Dolores Patty, MD;  [facility];  Service: Cardiovascular;  Laterality: N/A;   TRANSCATHETER AORTIC VALVE REPLACEMENT, TRANSFEMORAL N/A 02/04/2023   Procedure: Transcatheter Aortic Valve Replacement, Transfemoral;  Surgeon: Orbie Pyo, MD;  [facility];  Service: Open Heart Surgery;  Laterality: N/A;   TUBAL LIGATION  VULVA SURGERY     Multiple times for dysplasia   Past Surgical History:  Procedure Laterality Date   APPENDECTOMY     age 66   CARDIAC CATHETERIZATION     CESAREAN SECTION     x 2   COLONOSCOPY W/ POLYPECTOMY     COLONOSCOPY WITH PROPOFOL N/A 04/27/2016   Procedure: COLONOSCOPY WITH PROPOFOL;  Surgeon: Charolett Bumpers, MD;  Location: [facility];  Service: Endoscopy;  Laterality: N/A;   CORONARY STENT INTERVENTION N/A 01/29/2023   Procedure: CORONARY STENT INTERVENTION;  Surgeon: Elder Negus, MD;  [facility];  Service: Cardiovascular;  Laterality: N/A;   CORONARY THROMBECTOMY N/A 01/31/2023   Procedure: Coronary Thrombectomy;  Surgeon: Elder Negus, MD;  [facility];  Service: Cardiovascular;  Laterality: N/A;   CORONARY ULTRASOUND/IVUS N/A 01/31/2023   Procedure: Coronary Ultrasound/IVUS;  Surgeon: Elder Negus, MD;  [facility];  Service: Cardiovascular;  Laterality: N/A;   CORONARY/GRAFT ACUTE MI REVASCULARIZATION N/A 01/31/2023   Procedure: Coronary/Graft Acute MI Revascularization;   Surgeon: Elder Negus, MD;  [facility];  Service: Cardiovascular;  Laterality: N/A;   DILATION AND CURETTAGE OF UTERUS     multiple   ENDARTERECTOMY Right 06/09/2019   ENDARTERECTOMY Right 06/09/2019   Procedure: ENDARTERECTOMY CAROTID RIGHT;  Surgeon: Sherren Kerns, MD;  Location: [facility] OR;  Service: Vascular;  Laterality: Right;   IABP INSERTION N/A 02/02/2023   Procedure: IABP Insertion;  Surgeon: Elder Negus, MD;  [facility];  Service: Cardiovascular;  Laterality: N/A;   INTRAUTERINE DEVICE INSERTION     IUD REMOVAL     LEFT HEART CATH AND CORONARY ANGIOGRAPHY N/A 02/27/2020   Procedure: LEFT HEART CATH AND CORONARY ANGIOGRAPHY;  Surgeon: Yates Decamp, MD;  [facility];  Service: Cardiovascular;  Laterality: N/A;   LEFT HEART CATH AND CORONARY ANGIOGRAPHY N/A 01/31/2023   Procedure: LEFT HEART CATH AND CORONARY ANGIOGRAPHY;  Surgeon: Elder Negus, MD;  [facility];  Service: Cardiovascular;  Laterality: N/A;   MASTECTOMY PARTIAL / LUMPECTOMY W/ AXILLARY LYMPHADENECTOMY Right    lumpectomy and lymph nodes removed   PATCH ANGIOPLASTY Right 06/09/2019   Procedure: Patch Angioplasty of right carotid artery using hemashield paltinum finesse patch;  Surgeon: Sherren Kerns, MD;  Location: [facility] OR;  Service: Vascular;  Laterality: Right;   RIGHT AND LEFT HEART CATH N/A 02/01/2023   Procedure: RIGHT AND LEFT HEART CATH;  Surgeon: Elder Negus, MD;  [facility];  Service: Cardiovascular;  Laterality: N/A;   RIGHT HEART CATH N/A 02/02/2023   Procedure: RIGHT HEART CATH;  Surgeon: Dolores Patty, MD;  [facility];  Service: Cardiovascular;  Laterality: N/A;   RIGHT/LEFT HEART CATH AND CORONARY ANGIOGRAPHY N/A 01/27/2023   Procedure: RIGHT/LEFT HEART CATH AND CORONARY ANGIOGRAPHY;  Surgeon: Elder Negus, MD;  [facility];  Service:  Cardiovascular;  Laterality: N/A;   TEE WITHOUT CARDIOVERSION N/A 02/04/2023   Procedure: TRANSESOPHAGEAL ECHOCARDIOGRAM;  Surgeon: Orbie Pyo, MD;  Location: [facility] INVAS[facility] CV LAB;  Service: Open Heart Surgery;  Laterality: N/A;   TEMPORARY PACEMAKER N/A 02/02/2023   Procedure: TEMPORARY PACEMAKER;  Surgeon: Dolores Patty, MD;  [facility];  Service: Cardiovascular;  Laterality: N/A;   TRANSCATHETER AORTIC VALVE REPLACEMENT, TRANSFEMORAL N/A 02/04/2023   Procedure: Transcatheter Aortic Valve Replacement, Transfemoral;  Surgeon:  Orbie Pyo, MD;  [facility];  Service: Open Heart Surgery;  Laterality: N/A;   TUBAL LIGATION     VULVA SURGERY     Multiple times for dysplasia   Past Medical History:  Diagnosis Date   Anxiety    Arthritis    low back and hip pain intermittent   Breast cancer (HCC) 06/02/07   r breast -surgery ,radiaology. chemotherapy   Carpal tunnel syndrome    right hand   Colon polyps    Complication of anesthesia    Fentanyl, Versed-makes extra hyper, bradycardia x 1 in PACU, [facility] (08/15/11 cardiology felt neostigmine may have resulted in AV nodal block)    Coronary artery disease    Depression    denies   Dysplasia of vulva    Hypertension    Palpitations    PSVT, s/p adenosine 08/04/16   S/P breast lumpectomy 07/04/07   R breast   S/P radiation therapy 2009   There were no vitals taken for this visit.  Opioid Risk Score:   Fall Risk Score:  `1  Depression screen PHQ 2/9     08/02/2023    1:54 PM 07/26/2023    2:19 PM 07/05/2023    2:00 PM 05/10/2023   11:59 AM 01/19/2020    1:33 PM 10/04/2019    2:40 PM 07/03/2019   10:46 AM  Depression screen PHQ 2/9  Decreased Interest 0 1 0 0 0 0 1  Down, Depressed, Hopeless 0 0 0 0 0 0 1  PHQ - 2 Score 0 1 0 0 0 0 2  Altered sleeping  2  0   0  Tired, decreased energy  3  2   1   Change in appetite  1  0   0  Feeling bad or failure about yourself   0  0   0  Trouble  concentrating  0  0   0  Moving slowly or fidgety/restless  0  0   0  Suicidal thoughts  0  0   0  PHQ-9 Score  7  2   3   Difficult doing work/chores  Not difficult at all  Somewhat difficult         Review of Systems     Objective:   Physical Exam Vitals and nursing note reviewed.  Constitutional:      Appearance: Normal appearance.  Neck:     Comments: Cervical Paraspinal Tenderness: C-5-C-6 Cardiovascular:     Rate and Rhythm: Normal rate and regular rhythm.     Pulses: Normal pulses.     Heart sounds: Normal heart sounds.  Pulmonary:     Effort: Pulmonary effort is normal.     Breath sounds: Normal breath sounds.  Musculoskeletal:     Comments: Normal Muscle Bulk and Muscle Testing Reveals:  Upper Extremities: Full ROM and Muscle Strength 5/5  Lower Extremities: Full ROM and Muscle Strength 5/5 Arises from Table with Ease Narrow Based  Gait       Skin:    General: Skin is warm and dry.  Neurological:     Mental Status: She is alert and oriented to person, place, and time.  Psychiatric:        Mood and Affect: Mood normal.        Behavior: Behavior normal.         Assessment & Plan:  Cervicalgia: Continue HEP as Tolerated. Continue Current Medication Regimen.  Continue to Monitor. 09/30/2023 Chronic Bilateral Lower  Back Pain: Continue HEP as Tolerated. Continue to Monitor. 09/30/2023 History of Hemoptysis: Pulmonology Following. . Continue to Monior. 09/30/2023 Chronic Pain Syndrome: Continue Oxycodone as Prescribed. Continue to Monitor. We will continue the opioid monitoring program, this consists of regular clinic visits, examinations, urine drug screen, pill counts as well as use of [place] Controlled Substance Reporting system. A 12 month History has been reviewed on the [place] Controlled Substance Reporting System on 09/30/2023 Debility on Acute and Chronic Herat Failure: Continue Cardiac Rehabilitation. Continue to Monitor. 09/30/2023   F/U  in 3 months

## 2023-09-30 NOTE — Telephone Encounter (Signed)
Ok to use held slot

## 2023-09-30 NOTE — Progress Notes (Signed)
 I called the pt and there was no answer- LMTCB. ?

## 2023-09-30 NOTE — Telephone Encounter (Signed)
 There are no sooner availability . She prefers to see a MD.   Dr. Isaiah Serge, please advise if okay to see a held slot on 5/9?

## 2023-10-01 ENCOUNTER — Encounter (HOSPITAL_COMMUNITY): Payer: Medicare Other

## 2023-10-01 NOTE — Telephone Encounter (Signed)
 Spoke to patient and scheduled appt 10/25/2023. Address provided.  Nothing further needed.

## 2023-10-04 ENCOUNTER — Encounter (HOSPITAL_COMMUNITY)
Admission: RE | Admit: 2023-10-04 | Discharge: 2023-10-04 | Disposition: A | Discharge status: Home / Self Care | Payer: Medicare Other | Source: Ambulatory Visit | Attending: Cardiology

## 2023-10-04 DIAGNOSIS — I2102 ST elevation (STEMI) myocardial infarction involving left anterior descending coronary artery: Secondary | ICD-10-CM

## 2023-10-04 DIAGNOSIS — Z955 Presence of coronary angioplasty implant and graft: Secondary | ICD-10-CM

## 2023-10-04 DIAGNOSIS — Z952 Presence of prosthetic heart valve: Secondary | ICD-10-CM | POA: Diagnosis not present

## 2023-10-05 LAB — TOXASSURE SELECT,+ANTIDEPR,UR

## 2023-10-06 ENCOUNTER — Encounter (HOSPITAL_COMMUNITY)
Admission: RE | Admit: 2023-10-06 | Discharge: 2023-10-06 | Disposition: A | Discharge status: Home / Self Care | Payer: Medicare Other | Source: Ambulatory Visit | Attending: Cardiology | Admitting: Cardiology

## 2023-10-06 DIAGNOSIS — Z952 Presence of prosthetic heart valve: Secondary | ICD-10-CM | POA: Diagnosis not present

## 2023-10-06 DIAGNOSIS — Z955 Presence of coronary angioplasty implant and graft: Secondary | ICD-10-CM

## 2023-10-06 DIAGNOSIS — I2102 ST elevation (STEMI) myocardial infarction involving left anterior descending coronary artery: Secondary | ICD-10-CM

## 2023-10-07 NOTE — Progress Notes (Signed)
 I called and spoke with the pt. I offered her appt next available with Dr Waymond Hailey for next wk on 10/14/23 and she refused. She will keep appt with Dr. Waylan Haggard that she is scheduled for on 10/25/23 since there is nothing sooner to offer.

## 2023-10-08 ENCOUNTER — Encounter (HOSPITAL_COMMUNITY): Payer: Medicare Other

## 2023-10-11 ENCOUNTER — Encounter (HOSPITAL_COMMUNITY)
Admission: RE | Admit: 2023-10-11 | Discharge: 2023-10-11 | Disposition: A | Discharge status: Home / Self Care | Payer: Medicare Other | Source: Ambulatory Visit | Attending: Cardiology | Admitting: Cardiology

## 2023-10-11 DIAGNOSIS — Z952 Presence of prosthetic heart valve: Secondary | ICD-10-CM

## 2023-10-11 DIAGNOSIS — I2102 ST elevation (STEMI) myocardial infarction involving left anterior descending coronary artery: Secondary | ICD-10-CM

## 2023-10-11 DIAGNOSIS — Z955 Presence of coronary angioplasty implant and graft: Secondary | ICD-10-CM

## 2023-10-12 NOTE — Progress Notes (Signed)
 Cardiac Individual Treatment Plan  Patient Details  Name: Madison West MRN: [id number] Date of Birth: [birth year] Referring Provider:   Flowsheet Row INTENSIVE CARDIAC REHAB ORIENT from 07/26/2023 in [facility]  Referring Provider Fransico Ivy, MD       Initial Encounter Date:  Flowsheet Row INTENSIVE CARDIAC REHAB ORIENT from 07/26/2023 in [facility]  Date 07/26/23       Visit Diagnosis: 02/04/23 S/P TAVR (transcatheter aortic valve replacement)  02/01/23 STEMI  01/29/23 DES LAD  Patient's Home Medications on Admission:  Current Outpatient Medications:    acetaminophen  (TYLENOL ) 325 MG tablet, Take 2 tablets (650 mg total) by mouth every 6 (six) hours. (Patient taking differently: Take 500 mg by mouth every 6 (six) hours.), Disp: , Rfl:    alprazolam  (XANAX ) 2 MG tablet, Take 0.25-2 mg by mouth See admin instructions. 1 mg (may take up to 2 mg) twice daily. May also take 0.25 mg during the day if needed for anxiety., Disp: , Rfl:    aspirin  EC 81 MG tablet, Take 1 tablet (81 mg total) by mouth daily. Swallow whole., Disp: , Rfl:    busPIRone  (BUSPAR ) 10 MG tablet, TAKE 1 TABLET BY MOUTH TWICE A DAY (Patient taking differently: Take 5 mg by mouth 2 (two) times daily.), Disp: 180 tablet, Rfl: 1   clopidogrel  (PLAVIX ) 75 MG tablet, Take 1 tablet (75 mg total) by mouth daily., Disp: 90 tablet, Rfl: 3   dapagliflozin  propanediol (FARXIGA ) 10 MG TABS tablet, Take 1 tablet (10 mg total) by mouth daily before breakfast., Disp: 90 tablet, Rfl: 3   ezetimibe  (ZETIA ) 10 MG tablet, Take 1 tablet (10 mg total) by mouth daily. (Patient not taking: Reported on 09/08/2023), Disp: 90 tablet, Rfl: 3   furosemide  (LASIX ) 20 MG tablet, Take 0.5 tablets (10 mg total) by mouth daily as needed (for lower extremity swelling and weight gain)., Disp: 45 tablet, Rfl: 1   losartan   (COZAAR ) 25 MG tablet, Take 1 tablet (25 mg total) by mouth daily., Disp: 90 tablet, Rfl: 3   methocarbamol  (ROBAXIN ) 500 MG tablet, Take 1 tablet (500 mg total) by mouth 2 (two) times daily., Disp: 60 tablet, Rfl: 0   nitroGLYCERIN  (NITROSTAT ) 0.4 MG SL tablet, Place 1 tablet (0.4 mg total) under the tongue every 5 (five) minutes as needed for chest pain. (Patient not taking: Reported on 09/08/2023), Disp: 25 tablet, Rfl: 2   oxyCODONE  (ROXICODONE ) 5 MG immediate release tablet, Take 1 tablet (5 mg total) by mouth daily as needed for severe pain (pain score 7-10)., Disp: 30 tablet, Rfl: 0   rosuvastatin  (CRESTOR ) 20 MG tablet, Take 0.5 tablets (10 mg total) by mouth daily., Disp: , Rfl:    spironolactone  (ALDACTONE ) 25 MG tablet, Take 0.5 tablets (12.5 mg total) by mouth daily., Disp: 45 tablet, Rfl: 3  Past Medical History: Past Medical History:  Diagnosis Date   Anxiety    Arthritis    low back and hip pain intermittent   Breast cancer (HCC) 06/02/07   r breast -surgery ,radiaology. chemotherapy   Carpal tunnel syndrome    right hand   Colon polyps    Complication of anesthesia    Fentanyl , Versed -makes extra hyper, bradycardia x 1 in PACU, [facility] (08/15/11 cardiology felt neostigmine may have resulted in AV nodal block)    Coronary artery disease    Depression    denies  Dysplasia of vulva    Hypertension    Palpitations    PSVT, s/p adenosine  08/04/16   S/P breast lumpectomy 07/04/07   R breast   S/P radiation therapy 2009    Tobacco Use: Social History   Tobacco Use  Smoking Status Former   Current packs/day: 0.00   Average packs/day: 0.8 packs/day for 20.0 years (15.0 ttl pk-yrs)   Types: Cigarettes   Start date: 05/06/1977   Quit date: 05/06/1997   Years since quitting: 26.4  Smokeless Tobacco Never    Labs: Review Flowsheet  More data exists      Latest Ref Rng & Units 02/12/2023 02/13/2023 02/14/2023 06/21/2023 09/02/2023  Labs for ITP Cardiac and Pulmonary Rehab   Cholestrol 100 - 199 mg/dL - - - 454  098   LDL (calc) 0 - 99 mg/dL - - - 94  71   HDL-C >11 mg/dL - - - 67  54   Trlycerides 0 - 149 mg/dL - - - 914  99   O2 Saturation % 76.1  68.3  60.4  - -    Capillary Blood Glucose: Lab Results  Component Value Date   GLUCAP 87 02/19/2023   GLUCAP 103 (H) 02/19/2023   GLUCAP 121 (H) 02/18/2023   GLUCAP 111 (H) 02/18/2023   GLUCAP 110 (H) 02/17/2023     Exercise Target Goals: Exercise Program Goal: Individual exercise prescription set using results from initial 6 min walk test and THRR while considering  patient's activity barriers and safety.   Exercise Prescription Goal: Initial exercise prescription builds to 30-45 minutes a day of aerobic activity, 2-3 days per week.  Home exercise guidelines will be given to patient during program as part of exercise prescription that the participant will acknowledge.  Activity Barriers & Risk Stratification:  Activity Barriers & Cardiac Risk Stratification - 07/26/23 1419       Activity Barriers & Cardiac Risk Stratification   Activity Barriers Deconditioning;Back Problems;Neck/Spine Problems;Balance Concerns;Decreased Ventricular Function    Cardiac Risk Stratification High   <5 METs on            6 Minute Walk:  6 Minute Walk     Row Name 07/26/23 1619         6 Minute Walk   Phase Initial     Distance 1180 feet     Walk Time 6 minutes     # of Rest Breaks 1  2:20-3:20 3/10 hip pain     MPH 2.23     METS 2.6     RPE 10     Perceived Dyspnea  1     VO2 Peak 9.11     Symptoms Yes (comment)     Comments Pt SpO2 down to 84%, recovered to 94% in around a minute and maintained. RPD = 1, resolved with rest. 6/10 Hip pain at end of     Resting HR 68 bpm     Resting BP 112/60     Resting Oxygen Saturation  92 %     Exercise Oxygen Saturation  during 6 min walk 84 %     Max Ex. HR 100 bpm     Max Ex. BP 136/66     2 Minute Post BP 120/62              Oxygen  Initial Assessment:   Oxygen Re-Evaluation:   Oxygen Discharge (Final Oxygen Re-Evaluation):   Initial Exercise Prescription:  Initial Exercise Prescription - 07/26/23 1600  Date of Initial Exercise RX and Referring Provider   Date 07/26/23    Referring Provider Fransico Ivy, MD    Expected Discharge Date 10/20/23      Recumbant Bike   Level 1    RPM 50    Watts 30    Minutes 15    METs 2      NuStep   Level 1    SPM 60    Minutes 15    METs 2.5      Prescription Details   Frequency (times per week) 3    Duration Progress to 30 minutes of continuous aerobic without signs/symptoms of physical distress      Intensity   THRR 40-80% of Max Heartrate 60-121    Ratings of Perceived Exertion 11-13    Perceived Dyspnea 0-4      Progression   Progression Continue progressive overload as per policy without signs/symptoms or physical distress.      Resistance Training   Training Prescription Yes    Weight 2    Reps 10-15             Perform Capillary Blood Glucose checks as needed.  Exercise Prescription Changes:   Exercise Prescription Changes     Row Name 07/28/23 1400 08/18/23 1500 09/13/23 1500 09/22/23 1400 09/27/23 1500     Response to Exercise   Blood Pressure (Admit) 128/68 122/70 106/70 112/70 108/62   Blood Pressure (Exercise) 138/78 144/70 -- 114/64 120/70   Blood Pressure (Exit) 112/70 100/70 94/60 94/60  91/52   Heart Rate (Admit) 75 bpm 82 bpm 73 bpm 74 bpm 76 bpm   Heart Rate (Exercise) 102 bpm 107 bpm 89 bpm 91 bpm 90 bpm   Heart Rate (Exit) 81 bpm 81 bpm 78 bpm 76 bpm 82 bpm   Oxygen Saturation (Admit) 87 % -- 91 % 94 % 76 %   Oxygen Saturation (Exercise) 89 % 91 %  2 L O2 via n/c 92 % 93 % 90 %   Oxygen Saturation (Exit) 94 % 93 % 90 % 92 % 82 %   Rating of Perceived Exertion (Exercise) 11 13 11 12 11    Perceived Dyspnea (Exercise) 0.5 1 1 1 1    Symptoms SOB SOB SOB, RPD = 1 SOB, RPD = 1 SOB, RPD = 1, lightheaded after exercise    Comments Pt's first day in the CRP2 program Reviewed METs Reviewed METs and goals Reviewed home exercise Rx Reviewed METs   Duration Continue with 30 min of aerobic exercise without signs/symptoms of physical distress. Continue with 30 min of aerobic exercise without signs/symptoms of physical distress. Continue with 30 min of aerobic exercise without signs/symptoms of physical distress. Continue with 30 min of aerobic exercise without signs/symptoms of physical distress. Continue with 30 min of aerobic exercise without signs/symptoms of physical distress.   Intensity THRR unchanged THRR unchanged THRR unchanged THRR unchanged THRR unchanged     Progression   Progression Continue to progress workloads to maintain intensity without signs/symptoms of physical distress. Continue to progress workloads to maintain intensity without signs/symptoms of physical distress. Continue to progress workloads to maintain intensity without signs/symptoms of physical distress. Continue to progress workloads to maintain intensity without signs/symptoms of physical distress. Continue to progress workloads to maintain intensity without signs/symptoms of physical distress.   Average METs 2.15 2.4 2.4 2.6 2.7     Resistance Training   Training Prescription No No Yes No Yes   Weight No weights on wednesdays No  weights on wednesdays 3 lb No weights on wednesdays 2 lbs   Reps -- -- 10-15 -- 10-15   Time -- -- 10 Minutes -- 10 Minutes     Interval Training   Interval Training No No No No No     Recumbant Bike   Level 1 1 1 2 2    RPM 62 70 77 69 66   Watts 16 16 21 22 18    Minutes 15 15 15 15 15    METs 2 2 2.2 2.5 2.2     NuStep   Level 1 1 1 2 2    SPM 84 95 85 91 100   Minutes 15 15 15 15 15    METs 2.3 2.8 2.6 2.7 3.2     Home Exercise Plan   Plans to continue exercise at -- -- -- Home (comment) Home (comment)   Frequency -- -- -- Add 2 additional days to program exercise sessions. Add 2 additional days to  program exercise sessions.   Initial Home Exercises Provided -- -- -- 09/22/23 09/22/23    Row Name 10/11/23 1600             Response to Exercise   Blood Pressure (Admit) 100/56       Blood Pressure (Exit) 120/54       Heart Rate (Admit) 86 bpm       Heart Rate (Exercise) 103 bpm       Heart Rate (Exit) 86 bpm       Oxygen Saturation (Admit) 91 %       Oxygen Saturation (Exercise) 90 %       Oxygen Saturation (Exit) 89 %       Rating of Perceived Exertion (Exercise) 13       Perceived Dyspnea (Exercise) 1       Symptoms SOB, RPD = 1, lightheaded after exercise       Comments Reviewed METs and goals       Duration Continue with 30 min of aerobic exercise without signs/symptoms of physical distress.       Intensity THRR unchanged         Progression   Progression Continue to progress workloads to maintain intensity without signs/symptoms of physical distress.       Average METs 2.75         Resistance Training   Training Prescription Yes       Weight 2 lbs       Reps 10-15       Time 10 Minutes         Interval Training   Interval Training No         Recumbant Bike   Level 2       RPM 79       Minutes 15       METs 2.2         NuStep   Level 2       SPM 104       Minutes 15       METs 3.3         Home Exercise Plan   Plans to continue exercise at Home (comment)       Frequency Add 2 additional days to program exercise sessions.       Initial Home Exercises Provided 09/22/23                Exercise Comments:   Exercise Comments     Row Name 07/28/23 1415  08/18/23 1400 09/13/23 1500 09/22/23 1431 09/27/23 1500   Exercise Comments Pt's first day in the CRP2 program. Pt did have some mild SOB with exercise (RPD = .5). Will continue to montior and progress as tolerated. Reviwed METs. Pt is making slow progress. Order for O2 with exercise was obtained due to patient's drop in SaO2 during exercise. Reviewed METs and goals. Slow progress. Will encourage  increase in exercise workloads. Reviewed home exercise Rx. Pt would like to do pool exercise but pools are not open yet. Pt given some links for chair exercise that she can do at home. Pt verbalized understanding of the HERx and was provided a copy. Reviewed METs. Slow progression. Pt lightheaded after exercise. Had not eaten much today. H2) encouraged, given graham crarkers and PNB. Felt fine on D/C.            Exercise Goals and Review:   Exercise Goals     Row Name 07/26/23 1623             Exercise Goals   Increase Physical Activity Yes       Intervention Provide advice, education, support and counseling about physical activity/exercise needs.;Develop an individualized exercise prescription for aerobic and resistive training based on initial evaluation findings, risk stratification, comorbidities and participant's personal goals.       Expected Outcomes Short Term: Attend rehab on a regular basis to increase amount of physical activity.;Long Term: Exercising regularly at least 3-5 days a week.;Long Term: Add in home exercise to make exercise part of routine and to increase amount of physical activity.       Increase Strength and Stamina Yes       Intervention Develop an individualized exercise prescription for aerobic and resistive training based on initial evaluation findings, risk stratification, comorbidities and participant's personal goals.;Provide advice, education, support and counseling about physical activity/exercise needs.       Expected Outcomes Short Term: Increase workloads from initial exercise prescription for resistance, speed, and METs.;Short Term: Perform resistance training exercises routinely during rehab and add in resistance training at home;Long Term: Improve cardiorespiratory fitness, muscular endurance and strength as measured by increased METs and functional capacity ( )       Able to understand and use rate of perceived exertion (RPE) scale Yes        Intervention Provide education and explanation on how to use RPE scale       Expected Outcomes Short Term: Able to use RPE daily in rehab to express subjective intensity level;Long Term:  Able to use RPE to guide intensity level when exercising independently       Knowledge and understanding of Target Heart Rate Range (THRR) Yes       Intervention Provide education and explanation of THRR including how the numbers were predicted and where they are located for reference       Expected Outcomes Short Term: Able to state/look up THRR;Short Term: Able to use daily as guideline for intensity in rehab;Long Term: Able to use THRR to govern intensity when exercising independently       Understanding of Exercise Prescription Yes       Intervention Provide education, explanation, and written materials on patient's individual exercise prescription       Expected Outcomes Short Term: Able to explain program exercise prescription;Long Term: Able to explain home exercise prescription to exercise independently                Exercise Goals Re-Evaluation :  Exercise  Goals Re-Evaluation     Row Name 07/28/23 1414 09/13/23 1500           Exercise Goal Re-Evaluation   Exercise Goals Review Increase Physical Activity;Increase Strength and Stamina;Able to understand and use rate of perceived exertion (RPE) scale;Knowledge and understanding of Target Heart Rate Range (THRR);Understanding of Exercise Prescription Increase Physical Activity;Increase Strength and Stamina;Able to understand and use rate of perceived exertion (RPE) scale;Knowledge and understanding of Target Heart Rate Range (THRR);Understanding of Exercise Prescription      Comments Pt's first day in the CRP2 program. Pt understands the exercise RX, RPE sclae and THRR. Reviewed METs and goals. Pt voices progress on her goals of increased strength, stamina and energy. Pt has goal of weight loss and has lost 5.6 kg to date.      Expected Outcomes Will  continue to monitor patient and progress exercise workloads as tolerated. Will continue to monitor patient and progress exercise workloads as tolerated.               Discharge Exercise Prescription (Final Exercise Prescription Changes):  Exercise Prescription Changes - 10/11/23 1600       Response to Exercise   Blood Pressure (Admit) 100/56    Blood Pressure (Exit) 120/54    Heart Rate (Admit) 86 bpm    Heart Rate (Exercise) 103 bpm    Heart Rate (Exit) 86 bpm    Oxygen Saturation (Admit) 91 %    Oxygen Saturation (Exercise) 90 %    Oxygen Saturation (Exit) 89 %    Rating of Perceived Exertion (Exercise) 13    Perceived Dyspnea (Exercise) 1    Symptoms SOB, RPD = 1, lightheaded after exercise    Comments Reviewed METs and goals    Duration Continue with 30 min of aerobic exercise without signs/symptoms of physical distress.    Intensity THRR unchanged      Progression   Progression Continue to progress workloads to maintain intensity without signs/symptoms of physical distress.    Average METs 2.75      Resistance Training   Training Prescription Yes    Weight 2 lbs    Reps 10-15    Time 10 Minutes      Interval Training   Interval Training No      Recumbant Bike   Level 2    RPM 79    Minutes 15    METs 2.2      NuStep   Level 2    SPM 104    Minutes 15    METs 3.3      Home Exercise Plan   Plans to continue exercise at Home (comment)    Frequency Add 2 additional days to program exercise sessions.    Initial Home Exercises Provided 09/22/23             Nutrition:  Target Goals: Understanding of nutrition guidelines, daily intake of sodium 1500mg , cholesterol 200mg , calories 30% from fat and 7% or less from saturated fats, daily to have 5 or more servings of fruits and vegetables.  Biometrics:  Pre Biometrics - 07/26/23 1416       Pre Biometrics   Waist Circumference 48 inches    Hip Circumference 51 inches    Waist to Hip Ratio 0.94 %     Triceps Skinfold 16 mm    % Body Fat 41.8 %    Grip Strength 20 kg    Flexibility 13.5 in    Single Leg Stand 11.25 seconds  Nutrition Therapy Plan and Nutrition Goals:  Nutrition Therapy & Goals - 09/24/23 1040       Nutrition Therapy   Diet Heart Healthy Diet    Drug/Food Interactions Statins/Certain Fruits      Personal Nutrition Goals   Nutrition Goal Patient to identify strategies for reducing cardiovascular risk by attending the [facility] education and nutrition series weekly.   goal in progress.   Personal Goal #2 Patient to improve diet quality by using the plate method as a guide for meal planning to include lean protein/plant protein, fruits, vegetables, whole grains, nonfat dairy as part of a well-balanced diet.   goal in progress.   Personal Goal #3 Patient to identify strategies for weight loss of 0.5-2.0# per week.   goal not met.   Comments Goals in progress. Madison West has medical history of hyperlipidemia, s/p TAVR, STEMI, CHF, HTN, CAD, carotid artery stenosis. She continues to attend  the [facility] education and nutrition series regularly. She is motivated to lose weight to 170#; she is down 3.7# since starting with our program. LDL has improved to 71; she continues zetia , crestor  and heart healthy diet modifications. Patient will benefit from participation in intensive cardiac rehab for nutrition, exercise, and lifestyle modification.      Intervention Plan   Intervention Prescribe, educate and counsel regarding individualized specific dietary modifications aiming towards targeted core components such as weight, hypertension, lipid management, diabetes, heart failure and other comorbidities.;Nutrition handout(s) given to patient.    Expected Outcomes Short Term Goal: Understand basic principles of dietary content, such as calories, fat, sodium, cholesterol and nutrients.;Long Term Goal: Adherence to prescribed nutrition plan.             Nutrition  Assessments:  MEDIFICTS Score Key: >=70 Need to make dietary changes  40-70 Heart Healthy Diet <= 40 Therapeutic Level Cholesterol Diet    Picture Your Plate Scores: <13 Unhealthy dietary pattern with much room for improvement. 41-50 Dietary pattern unlikely to meet recommendations for good health and room for improvement. 51-60 More healthful dietary pattern, with some room for improvement.  >60 Healthy dietary pattern, although there may be some specific behaviors that could be improved.    Nutrition Goals Re-Evaluation:  Nutrition Goals Re-Evaluation     Row Name 08/24/23 1018 09/24/23 1040           Goals   Current Weight 202 lb 6.1 oz (91.8 kg) 199 lb 1.2 oz (90.3 kg)      Comment LDL 94, Lpa 227 LDL 71, HDL WNL, Lpa 227      Expected Outcome Goals in progress. Madison West has medical history of hyperlipidemia, s/p TAVR, STEMI, CHF, HTN, CAD, carotid artery stenosis. She continues to attend the [facility] education and nutrition series regularly. She is motivated to lose weight to 170#; however, she has not lost weight since starting with our program. LDL remains above goal; she continues zetia , crestor  and heart healthy diet modifications. Patient will benefit from participation in intensive cardiac rehab for nutrition, exercise, and lifestyle modification. Goals in progress. Madison West has medical history of hyperlipidemia, s/p TAVR, STEMI, CHF, HTN, CAD, carotid artery stenosis. She continues to attend the [facility] education and nutrition series regularly. She is motivated to lose weight to 170#; she is down 3.7# since starting with our program. LDL has improved to 71; she continues zetia , crestor  and heart healthy diet modifications. Patient will benefit from participation in intensive cardiac rehab for nutrition, exercise, and lifestyle modification.90.3kg  Nutrition Goals Re-Evaluation:  Nutrition Goals Re-Evaluation     Row Name 08/24/23 1018 09/24/23 1040            Goals   Current Weight 202 lb 6.1 oz (91.8 kg) 199 lb 1.2 oz (90.3 kg)      Comment LDL 94, Lpa 227 LDL 71, HDL WNL, Lpa 227      Expected Outcome Goals in progress. Madison West has medical history of hyperlipidemia, s/p TAVR, STEMI, CHF, HTN, CAD, carotid artery stenosis. She continues to attend the [facility] education and nutrition series regularly. She is motivated to lose weight to 170#; however, she has not lost weight since starting with our program. LDL remains above goal; she continues zetia , crestor  and heart healthy diet modifications. Patient will benefit from participation in intensive cardiac rehab for nutrition, exercise, and lifestyle modification. Goals in progress. Madison West has medical history of hyperlipidemia, s/p TAVR, STEMI, CHF, HTN, CAD, carotid artery stenosis. She continues to attend the [facility] education and nutrition series regularly. She is motivated to lose weight to 170#; she is down 3.7# since starting with our program. LDL has improved to 71; she continues zetia , crestor  and heart healthy diet modifications. Patient will benefit from participation in intensive cardiac rehab for nutrition, exercise, and lifestyle modification.90.3kg               Nutrition Goals Discharge (Final Nutrition Goals Re-Evaluation):  Nutrition Goals Re-Evaluation - 09/24/23 1040       Goals   Current Weight 199 lb 1.2 oz (90.3 kg)    Comment LDL 71, HDL WNL, Lpa 227    Expected Outcome Goals in progress. Madison West has medical history of hyperlipidemia, s/p TAVR, STEMI, CHF, HTN, CAD, carotid artery stenosis. She continues to attend the [facility] education and nutrition series regularly. She is motivated to lose weight to 170#; she is down 3.7# since starting with our program. LDL has improved to 71; she continues zetia , crestor  and heart healthy diet modifications. Patient will benefit from participation in intensive cardiac rehab for nutrition, exercise, and lifestyle modification.90.3kg              Psychosocial: Target Goals: Acknowledge presence or absence of significant depression and/or stress, maximize coping skills, provide positive support system. Participant is able to verbalize types and ability to use techniques and skills needed for reducing stress and depression.  Initial Review & Psychosocial Screening:  Initial Psych Review & Screening - 07/26/23 1420       Initial Review   Current issues with Current Anxiety/Panic;Current Stress Concerns    Source of Stress Concerns Chronic Illness;Family    Comments Madison West shared that she has high levels of anxiety and stress due to the amount of tasks she has to do throughout her day. She feels the xanax  is helping and denies any need for additional resources at this time. She has been very fatigued since her event. Both daughters and grandson live in her home, while they do offer support they are not helpful with house chores and this is stressful for [employer].      Family Dynamics   Good Support System? Yes   Madison West has her daughters and friends for support     Barriers   Psychosocial barriers to participate in program The patient should benefit from training in stress management and relaxation.      Screening Interventions   Interventions Provide feedback about the scores to participant;To provide support and resources with identified psychosocial needs;Encouraged to exercise  Expected Outcomes Long Term goal: The participant improves quality of Life and PHQ9 Scores as seen by post scores and/or verbalization of changes;Short Term goal: Identification and review with participant of any Quality of Life or Depression concerns found by scoring the questionnaire.;Long Term Goal: Stressors or current issues are controlled or eliminated.             Quality of Life Scores:  Quality of Life - 07/26/23 1623       Quality of Life   Select Quality of Life      Quality of Life Scores   Health/Function Pre 20 %    Socioeconomic Pre  26.57 %    Psych/Spiritual Pre 18.86 %    Family Pre 18 %    GLOBAL Pre 21.1 %            Scores of 19 and below usually indicate a poorer quality of life in these areas.  A difference of  2-3 points is a clinically meaningful difference.  A difference of 2-3 points in the total score of the Quality of Life Index has been associated with significant improvement in overall quality of life, self-image, physical symptoms, and general health in studies assessing change in quality of life.  PHQ-9: Review Flowsheet  More data exists      08/02/2023 07/26/2023 07/05/2023 05/10/2023 01/19/2020  Depression screen PHQ 2/9  Decreased Interest 0 1 0 0 0  Down, Depressed, Hopeless 0 0 0 0 0  PHQ - 2 Score 0 1 0 0 0  Altered sleeping - 2 - 0 -  Tired, decreased energy - 3 - 2 -  Change in appetite - 1 - 0 -  Feeling bad or failure about yourself  - 0 - 0 -  Trouble concentrating - 0 - 0 -  Moving slowly or fidgety/restless - 0 - 0 -  Suicidal thoughts - 0 - 0 -  PHQ-9 Score - 7 - 2 -  Difficult doing work/chores - Not difficult at all - Somewhat difficult -   Interpretation of Total Score  Total Score Depression Severity:  1-4 = Minimal depression, 5-9 = Mild depression, 10-14 = Moderate depression, 15-19 = Moderately severe depression, 20-27 = Severe depression   Psychosocial Evaluation and Intervention:   Psychosocial Re-Evaluation:  Psychosocial Re-Evaluation     Row Name 08/11/23 [phone] 08/19/23 1534 09/21/23 0955 10/12/23 0905       Psychosocial Re-Evaluation   Current issues with Current Stress Concerns;Current Anxiety/Panic Current Stress Concerns;Current Anxiety/Panic Current Stress Concerns;Current Anxiety/Panic Current Stress Concerns;Current Anxiety/Panic    Comments Reviewed quality of life and PHq2-9.Madison West says she is dissatisfied with her health due to her recent MI, Stenting and TAVR. Madison West says that her adult children who live with her do not help as they should. Madison West denies being  depressed currently. Madison West takes xanax  for anxiety. Madison West says that her anxiety is controlled. Madison West sees a psychiatrist once a year.  Offered emotional support and reassurance. Madison West continues to voice having family stress with her live in children. Madison West also reports having stress about her respitory status as she is now wearing oxygen during exercise. Emotional support provided. Madison West continues to voice having family stress with her live in children. Emotional support provided Madison West continues to voice having family stress with her live in children. Emotional support provided. Madison West would benifit from couseling. Madison West says she is not interested at this time.    Expected Outcomes Madison West will have decreased or controlled anxiety/ stressors  upon completion of cardiac rehab Madison West will have decreased or controlled anxiety/ stressors upon completion of cardiac rehab Madison West will have decreased or controlled anxiety/ stressors upon completion of cardiac rehab Madison West will have decreased or controlled anxiety/ stressors upon completion of cardiac rehab    Interventions Stress management education;Relaxation education;Encouraged to attend Cardiac Rehabilitation for the exercise Stress management education;Relaxation education;Encouraged to attend Cardiac Rehabilitation for the exercise Stress management education;Relaxation education;Encouraged to attend Cardiac Rehabilitation for the exercise Stress management education;Relaxation education;Encouraged to attend Cardiac Rehabilitation for the exercise    Continue Psychosocial Services  Follow up required by staff Follow up required by staff Follow up required by staff Follow up required by staff      Initial Review   Source of Stress Concerns Chronic Illness;Family Chronic Illness;Family Chronic Illness;Family Chronic Illness;Family    Comments Will continue to monitor and offer support as needed. Will continue to monitor and offer support as needed. Will continue to monitor and offer support as  needed. Will continue to monitor and offer support as needed.             Psychosocial Discharge (Final Psychosocial Re-Evaluation):  Psychosocial Re-Evaluation - 10/12/23 0905       Psychosocial Re-Evaluation   Current issues with Current Stress Concerns;Current Anxiety/Panic    Comments Madison West continues to voice having family stress with her live in children. Emotional support provided. Madison West would benifit from couseling. Madison West says she is not interested at this time.    Expected Outcomes Madison West will have decreased or controlled anxiety/ stressors upon completion of cardiac rehab    Interventions Stress management education;Relaxation education;Encouraged to attend Cardiac Rehabilitation for the exercise    Continue Psychosocial Services  Follow up required by staff      Initial Review   Source of Stress Concerns Chronic Illness;Family    Comments Will continue to monitor and offer support as needed.             Vocational Rehabilitation: Provide vocational rehab assistance to qualifying candidates.   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 Participant will state understanding/return demonstration of topics presented.    Education     Row Name 07/28/23 1300     Education   Cardiac Education Topics Pritikin   Secondary school teacher School   Educator Respiratory Therapist;Nurse   Weekly Topic Powerhouse Plant-Based Proteins   Instruction Review Code 1- Verbalizes Understanding   Class Start Time 1150   Class Stop Time 1225   Class Time Calculation (min) 35 min    Row Name 08/09/23 1300     Education   Cardiac Education Topics Pritikin   Engineering geologist Exercise Physiologist   Select Psychosocial   Psychosocial Healthy Minds, Bodies, Hearts   Instruction Review Code 1- Verbalizes Understanding   Class Start Time 1155   Class Stop Time 1240   Class Time Calculation (min) 45 min    Row Name 08/16/23 1300     Education   Cardiac Education Topics Pritikin   Psychologist, sport and exercise     Core Videos  Educator Nurse   Select Nutrition   Nutrition Becoming a Neurosurgeon   Instruction Review Code 1- Verbalizes Understanding   Class Start Time 1150   Class Stop Time 1226   Class Time Calculation (min) 36 min    Row Name 08/18/23 1300     Education   Cardiac Education Topics Pritikin   Secondary school teacher School   Educator Dietitian;Nurse   Weekly Topic Personalizing Your Pritikin Plate   Instruction Review Code 1- Verbalizes Understanding   Class Start Time 1149   Class Stop Time 1233   Class Time Calculation (min) 44 min    Row Name 08/23/23 1600     Education   Cardiac Education Topics Pritikin   Select Workshops     Workshops   Educator Exercise Physiologist   Select Psychosocial   Psychosocial Workshop Recognizing and Reducing Stress   Instruction Review Code 1- Verbalizes Understanding   Class Start Time 1145   Class Stop Time 1228   Class Time Calculation (min) 43 min    Row Name 08/25/23 1400     Education   Cardiac Education Topics Pritikin   Customer service manager   Weekly Topic [employer] Desserts   Instruction Review Code 1- Verbalizes Understanding   Class Start Time 1145   Class Stop Time 1222   Class Time Calculation (min) 37 min    Row Name 08/30/23 1300     Education   Cardiac Education Topics Pritikin   [employer]     Workshops   Educator Exercise Physiologist   Select Exercise   Exercise Workshop Exercise Basics: Building Your Action Plan   Instruction Review Code 1- Verbalizes Understanding    Class Start Time 1216   Class Stop Time 1235   Class Time Calculation (min) 19 min    Row Name 09/01/23 1600     Education   Cardiac Education Topics Pritikin   Customer service manager   Weekly Topic Efficiency Cooking - Meals in a Snap   Instruction Review Code 1- Verbalizes Understanding   Class Start Time 1145   Class Stop Time 1225   Class Time Calculation (min) 40 min    Row Name 09/13/23 1300     Education   Cardiac Education Topics Pritikin   [employer]     Workshops   Educator Exercise Physiologist   Select Psychosocial   Psychosocial Workshop Focused Goals, Sustainable Changes   Instruction Review Code 1- Verbalizes Understanding   Class Start Time 1144   Class Stop Time 1218   Class Time Calculation (min) 34 min    Row Name 09/15/23 1300     Education   Cardiac Education Topics Pritikin   Customer service manager   Weekly Topic Comforting Weekend Breakfasts   Instruction Review Code 1- Verbalizes Understanding   Class Start Time 1145   Class Stop Time 1228   Class Time Calculation (min) 43 min    Row Name 09/20/23 1200     Education   Cardiac Education Topics Pritikin   Psychologist, forensic Exercise Education   Exercise Education Biomechanial Limitations   Instruction Review Code 1- Verbalizes Understanding   Class Start Time 1145   Class Stop Time  1218   Class Time Calculation (min) 33 min    Row Name 09/27/23 1500     Education   Cardiac Education Topics Pritikin   Glass blower/designer Nutrition   Nutrition Workshop Fueling a Forensic psychologist   Instruction Review Code 1- TEFL teacher Understanding   Class Start Time 1145   Class Stop Time 1230   Class Time Calculation (min) 45 min    Row Name 09/29/23 1500     Education   Cardiac Education Topics Pritikin    Customer service manager   Weekly Topic [employer]isine- Spotlight on the Blue Zones   Instruction Review Code 1- Verbalizes Understanding   Class Start Time 1145   Class Stop Time 1225   Class Time Calculation (min) 40 min    Row Name 10/11/23 1300     Education   Cardiac Education Topics Pritikin   Select Workshops     Workshops   Educator Exercise Physiologist   Select Exercise   Exercise Workshop Managing Heart Disease: Your Path to a Healthier Heart   Instruction Review Code 1- Verbalizes Understanding   Class Start Time 1145   Class Stop Time 1237   Class Time Calculation (min) 52 min            Core Videos: Exercise    Move It!  Clinical staff conducted group or individual video education with verbal and written material and guidebook.  Patient learns the recommended Pritikin exercise program. Exercise with the goal of living a long, healthy life. Some of the health benefits of exercise include controlled diabetes, healthier blood pressure levels, improved cholesterol levels, improved heart and lung capacity, improved sleep, and better body composition. Everyone should speak with their doctor before starting or changing an exercise routine.  Biomechanical Limitations Clinical staff conducted group or individual video education with verbal and written material and guidebook.  Patient learns how biomechanical limitations can impact exercise and how we can mitigate and possibly overcome limitations to have an impactful and balanced exercise routine.  Body Composition Clinical staff conducted group or individual video education with verbal and written material and guidebook.  Patient learns that body composition (ratio of muscle mass to fat mass) is a key component to assessing overall fitness, rather than body weight alone. Increased fat mass, especially visceral belly fat, can put us  at increased risk for metabolic syndrome,  type 2 diabetes, heart disease, and even death. It is recommended to combine diet and exercise (cardiovascular and resistance training) to improve your body composition. Seek guidance from your physician and exercise physiologist before implementing an exercise routine.  Exercise Action Plan Clinical staff conducted group or individual video education with verbal and written material and guidebook.  Patient learns the recommended strategies to achieve and enjoy long-term exercise adherence, including variety, self-motivation, self-efficacy, and positive decision making. Benefits of exercise include fitness, good health, weight management, more energy, better sleep, less stress, and overall well-being.  Medical   Heart Disease Risk Reduction Clinical staff conducted group or individual video education with verbal and written material and guidebook.  Patient learns our heart is our most vital organ as it circulates oxygen, nutrients, white blood cells, and hormones throughout the entire body, and carries waste away. Data supports a plant-based eating plan like the Pritikin Program for its effectiveness in slowing progression of and reversing heart disease. The video provides  a number of recommendations to address heart disease.   Metabolic Syndrome and Belly Fat  Clinical staff conducted group or individual video education with verbal and written material and guidebook.  Patient learns what metabolic syndrome is, how it leads to heart disease, and how one can reverse it and keep it from coming back. You have metabolic syndrome if you have 3 of the following 5 criteria: abdominal obesity, high blood pressure, high triglycerides, low HDL cholesterol, and high blood sugar.  Hypertension and Heart Disease Clinical staff conducted group or individual video education with verbal and written material and guidebook.  Patient learns that high blood pressure, or hypertension, is very common in the [place]. Hypertension is largely due to excessive salt intake, but other important risk factors include being overweight, physical inactivity, drinking too much alcohol, smoking, and not eating enough potassium from fruits and vegetables. High blood pressure is a leading risk factor for heart attack, stroke, congestive heart failure, dementia, kidney failure, and premature death. Long-term effects of excessive salt intake include stiffening of the arteries and thickening of heart muscle and organ damage. Recommendations include ways to reduce hypertension and the risk of heart disease.  Diseases of Our Time - Focusing on Diabetes Clinical staff conducted group or individual video education with verbal and written material and guidebook.  Patient learns why the best way to stop diseases of our time is prevention, through food and other lifestyle changes. Medicine (such as prescription pills and surgeries) is often only a Band-Aid on the problem, not a long-term solution. Most common diseases of our time include obesity, type 2 diabetes, hypertension, heart disease, and cancer. The Pritikin Program is recommended and has been proven to help reduce, reverse, and/or prevent the damaging effects of metabolic syndrome.  Nutrition   Overview of the Pritikin Eating Plan  Clinical staff conducted group or individual video education with verbal and written material and guidebook.  Patient learns about the Pritikin Eating Plan for disease risk reduction. The Pritikin Eating Plan emphasizes a wide variety of unrefined, minimally-processed carbohydrates, like fruits, vegetables, whole grains, and legumes. Go, Caution, and Stop food choices are explained. Plant-based and lean animal proteins are emphasized. Rationale provided for low sodium intake for blood pressure control, low added sugars for blood sugar stabilization, and low added fats and oils for coronary artery disease risk reduction and weight  management.  Calorie Density  Clinical staff conducted group or individual video education with verbal and written material and guidebook.  Patient learns about calorie density and how it impacts the Pritikin Eating Plan. Knowing the characteristics of the food you choose will help you decide whether those foods will lead to weight gain or weight loss, and whether you want to consume more or less of them. Weight loss is usually a side effect of the Pritikin Eating Plan because of its focus on low calorie-dense foods.  Label Reading  Clinical staff conducted group or individual video education with verbal and written material and guidebook.  Patient learns about the Pritikin recommended label reading guidelines and corresponding recommendations regarding calorie density, added sugars, sodium content, and whole grains.  Dining Out - Part 1  Clinical staff conducted group or individual video education with verbal and written material and guidebook.  Patient learns that restaurant meals can be sabotaging because they can be so high in calories, fat, sodium, and/or sugar. Patient learns recommended strategies on how to positively address this and avoid unhealthy pitfalls.  Facts on Fats  Clinical staff conducted group or individual video education with verbal and written material and guidebook.  Patient learns that lifestyle modifications can be just as effective, if not more so, as many medications for lowering your risk of heart disease. A Pritikin lifestyle can help to reduce your risk of inflammation and atherosclerosis (cholesterol build-up, or plaque, in the artery walls). Lifestyle interventions such as dietary choices and physical activity address the cause of atherosclerosis. A review of the types of fats and their impact on blood cholesterol levels, along with dietary recommendations to reduce fat intake is also included.  Nutrition Action Plan  Clinical staff conducted group or individual  video education with verbal and written material and guidebook.  Patient learns how to incorporate Pritikin recommendations into their lifestyle. Recommendations include planning and keeping personal health goals in mind as an important part of their success.  Healthy Mind-Set    Healthy Minds, Bodies, Hearts  Clinical staff conducted group or individual video education with verbal and written material and guidebook.  Patient learns how to identify when they are stressed. Video will discuss the impact of that stress, as well as the many benefits of stress management. Patient will also be introduced to stress management techniques. The way we think, act, and feel has an impact on our hearts.  How Our Thoughts Can Heal Our Hearts  Clinical staff conducted group or individual video education with verbal and written material and guidebook.  Patient learns that negative thoughts can cause depression and anxiety. This can result in negative lifestyle behavior and serious health problems. Cognitive behavioral therapy is an effective method to help control our thoughts in order to change and improve our emotional outlook.  Additional Videos:  Exercise    Improving Performance  Clinical staff conducted group or individual video education with verbal and written material and guidebook.  Patient learns to use a non-linear approach by alternating intensity levels and lengths of time spent exercising to help burn more calories and lose more body fat. Cardiovascular exercise helps improve heart health, metabolism, hormonal balance, blood sugar control, and recovery from fatigue. Resistance training improves strength, endurance, balance, coordination, reaction time, metabolism, and muscle mass. Flexibility exercise improves circulation, posture, and balance. Seek guidance from your physician and exercise physiologist before implementing an exercise routine and learn your capabilities and proper form for all  exercise.  Introduction to Yoga  Clinical staff conducted group or individual video education with verbal and written material and guidebook.  Patient learns about yoga, a discipline of the coming together of mind, breath, and body. The benefits of yoga include improved flexibility, improved range of motion, better posture and core strength, increased lung function, weight loss, and positive self-image. Yoga's heart health benefits include lowered blood pressure, healthier heart rate, decreased cholesterol and triglyceride levels, improved immune function, and reduced stress. Seek guidance from your physician and exercise physiologist before implementing an exercise routine and learn your capabilities and proper form for all exercise.  Medical   Aging: Enhancing Your Quality of Life  Clinical staff conducted group or individual video education with verbal and written material and guidebook.  Patient learns key strategies and recommendations to stay in good physical health and enhance quality of life, such as prevention strategies, having an advocate, securing a Health Care Proxy and Power of Attorney, and keeping a list of medications and system for tracking them. It also discusses how to avoid risk for bone loss.  Biology of Weight Control  Clinical staff conducted  group or individual video education with verbal and written material and guidebook.  Patient learns that weight gain occurs because we consume more calories than we burn (eating more, moving less). Even if your body weight is normal, you may have higher ratios of fat compared to muscle mass. Too much body fat puts you at increased risk for cardiovascular disease, heart attack, stroke, type 2 diabetes, and obesity-related cancers. In addition to exercise, following the Pritikin Eating Plan can help reduce your risk.  Decoding Lab Results  Clinical staff conducted group or individual video education with verbal and written material and  guidebook.  Patient learns that lab test reflects one measurement whose values change over time and are influenced by many factors, including medication, stress, sleep, exercise, food, hydration, pre-existing medical conditions, and more. It is recommended to use the knowledge from this video to become more involved with your lab results and evaluate your numbers to speak with your doctor.   Diseases of Our Time - Overview  Clinical staff conducted group or individual video education with verbal and written material and guidebook.  Patient learns that according to the CDC, 50% to 70% of chronic diseases (such as obesity, type 2 diabetes, elevated lipids, hypertension, and heart disease) are avoidable through lifestyle improvements including healthier food choices, listening to satiety cues, and increased physical activity.  Sleep Disorders Clinical staff conducted group or individual video education with verbal and written material and guidebook.  Patient learns how good quality and duration of sleep are important to overall health and well-being. Patient also learns about sleep disorders and how they impact health along with recommendations to address them, including discussing with a physician.  Nutrition  Dining Out - Part 2 Clinical staff conducted group or individual video education with verbal and written material and guidebook.  Patient learns how to plan ahead and communicate in order to maximize their dining experience in a healthy and nutritious manner. Included are recommended food choices based on the type of restaurant the patient is visiting.   Fueling a Banker conducted group or individual video education with verbal and written material and guidebook.  There is a strong connection between our food choices and our health. Diseases like obesity and type 2 diabetes are very prevalent and are in large-part due to lifestyle choices. The Pritikin Eating Plan  provides plenty of food and hunger-curbing satisfaction. It is easy to follow, affordable, and helps reduce health risks.  Menu Workshop  Clinical staff conducted group or individual video education with verbal and written material and guidebook.  Patient learns that restaurant meals can sabotage health goals because they are often packed with calories, fat, sodium, and sugar. Recommendations include strategies to plan ahead and to communicate with the manager, chef, or server to help order a healthier meal.  Planning Your Eating Strategy  Clinical staff conducted group or individual video education with verbal and written material and guidebook.  Patient learns about the Pritikin Eating Plan and its benefit of reducing the risk of disease. The Pritikin Eating Plan does not focus on calories. Instead, it emphasizes high-quality, nutrient-rich foods. By knowing the characteristics of the foods, we choose, we can determine their calorie density and make informed decisions.  Targeting Your Nutrition Priorities  Clinical staff conducted group or individual video education with verbal and written material and guidebook.  Patient learns that lifestyle habits have a tremendous impact on disease risk and progression. This video provides eating and physical activity  recommendations based on your personal health goals, such as reducing LDL cholesterol, losing weight, preventing or controlling type 2 diabetes, and reducing high blood pressure.  Vitamins and Minerals  Clinical staff conducted group or individual video education with verbal and written material and guidebook.  Patient learns different ways to obtain key vitamins and minerals, including through a recommended healthy diet. It is important to discuss all supplements you take with your doctor.   Healthy Mind-Set    Smoking Cessation  Clinical staff conducted group or individual video education with verbal and written material and guidebook.   Patient learns that cigarette smoking and tobacco addiction pose a serious health risk which affects millions of people. Stopping smoking will significantly reduce the risk of heart disease, lung disease, and many forms of cancer. Recommended strategies for quitting are covered, including working with your doctor to develop a successful plan.  Culinary   Becoming a Set designer conducted group or individual video education with verbal and written material and guidebook.  Patient learns that cooking at home can be healthy, cost-effective, quick, and puts them in control. Keys to cooking healthy recipes will include looking at your recipe, assessing your equipment needs, planning ahead, making it simple, choosing cost-effective seasonal ingredients, and limiting the use of added fats, salts, and sugars.  Cooking - Breakfast and Snacks  Clinical staff conducted group or individual video education with verbal and written material and guidebook.  Patient learns how important breakfast is to satiety and nutrition through the entire day. Recommendations include key foods to eat during breakfast to help stabilize blood sugar levels and to prevent overeating at meals later in the day. Planning ahead is also a key component.  Cooking - Educational psychologist conducted group or individual video education with verbal and written material and guidebook.  Patient learns eating strategies to improve overall health, including an approach to cook more at home. Recommendations include thinking of animal protein as a side on your plate rather than center stage and focusing instead on lower calorie dense options like vegetables, fruits, whole grains, and plant-based proteins, such as beans. Making sauces in large quantities to freeze for later and leaving the skin on your vegetables are also recommended to maximize your experience.  Cooking - Healthy Salads and Dressing Clinical staff  conducted group or individual video education with verbal and written material and guidebook.  Patient learns that vegetables, fruits, whole grains, and legumes are the foundations of the Pritikin Eating Plan. Recommendations include how to incorporate each of these in flavorful and healthy salads, and how to create homemade salad dressings. Proper handling of ingredients is also covered. Cooking - Soups and [employer] - Soups and Desserts Clinical staff conducted group or individual video education with verbal and written material and guidebook.  Patient learns that Pritikin soups and desserts make for easy, nutritious, and delicious snacks and meal components that are low in sodium, fat, sugar, and calorie density, while high in vitamins, minerals, and filling fiber. Recommendations include simple and healthy ideas for soups and desserts.   Overview     The Pritikin Solution Program Overview Clinical staff conducted group or individual video education with verbal and written material and guidebook.  Patient learns that the results of the Pritikin Program have been documented in more than 100 articles published in peer-reviewed journals, and the benefits include reducing risk factors for (and, in some cases, even reversing) high cholesterol, high blood pressure,  type 2 diabetes, obesity, and more! An overview of the three key pillars of the Pritikin Program will be covered: eating well, doing regular exercise, and having a healthy mind-set.  WORKSHOPS  Exercise: Exercise Basics: Building Your Action Plan Clinical staff led group instruction and group discussion with PowerPoint presentation and patient guidebook. To enhance the learning environment the use of posters, models and videos may be added. At the conclusion of this workshop, patients will comprehend the difference between physical activity and exercise, as well as the benefits of incorporating both, into their routine. Patients will  understand the FITT (Frequency, Intensity, Time, and Type) principle and how to use it to build an exercise action plan. In addition, safety concerns and other considerations for exercise and cardiac rehab will be addressed by the presenter. The purpose of this lesson is to promote a comprehensive and effective weekly exercise routine in order to improve patients' overall level of fitness.   Managing Heart Disease: Your Path to a Healthier Heart Clinical staff led group instruction and group discussion with PowerPoint presentation and patient guidebook. To enhance the learning environment the use of posters, models and videos may be added.At the conclusion of this workshop, patients will understand the anatomy and physiology of the heart. Additionally, they will understand how Pritikin's three pillars impact the risk factors, the progression, and the management of heart disease.  The purpose of this lesson is to provide a high-level overview of the heart, heart disease, and how the Pritikin lifestyle positively impacts risk factors.  Exercise Biomechanics Clinical staff led group instruction and group discussion with PowerPoint presentation and patient guidebook. To enhance the learning environment the use of posters, models and videos may be added. Patients will learn how the structural parts of their bodies function and how these functions impact their daily activities, movement, and exercise. Patients will learn how to promote a neutral spine, learn how to manage pain, and identify ways to improve their physical movement in order to promote healthy living. The purpose of this lesson is to expose patients to common physical limitations that impact physical activity. Participants will learn practical ways to adapt and manage aches and pains, and to minimize their effect on regular exercise. Patients will learn how to maintain good posture while sitting, walking, and lifting.  Balance Training  and Fall Prevention  Clinical staff led group instruction and group discussion with PowerPoint presentation and patient guidebook. To enhance the learning environment the use of posters, models and videos may be added. At the conclusion of this workshop, patients will understand the importance of their sensorimotor skills (vision, proprioception, and the vestibular system) in maintaining their ability to balance as they age. Patients will apply a variety of balancing exercises that are appropriate for their current level of function. Patients will understand the common causes for poor balance, possible solutions to these problems, and ways to modify their physical environment in order to minimize their fall risk. The purpose of this lesson is to teach patients about the importance of maintaining balance as they age and ways to minimize their risk of falling.  WORKSHOPS   Nutrition:  Fueling a Ship broker led group instruction and group discussion with PowerPoint presentation and patient guidebook. To enhance the learning environment the use of posters, models and videos may be added. Patients will review the foundational principles of the Pritikin Eating Plan and understand what constitutes a serving size in each of the food groups. Patients will also learn Pritikin-friendly  foods that are better choices when away from home and review make-ahead meal and snack options. Calorie density will be reviewed and applied to three nutrition priorities: weight maintenance, weight loss, and weight gain. The purpose of this lesson is to reinforce (in a group setting) the key concepts around what patients are recommended to eat and how to apply these guidelines when away from home by planning and selecting Pritikin-friendly options. Patients will understand how calorie density may be adjusted for different weight management goals.  Mindful Eating  Clinical staff led group instruction and group  discussion with PowerPoint presentation and patient guidebook. To enhance the learning environment the use of posters, models and videos may be added. Patients will briefly review the concepts of the Pritikin Eating Plan and the importance of low-calorie dense foods. The concept of mindful eating will be introduced as well as the importance of paying attention to internal hunger signals. Triggers for non-hunger eating and techniques for dealing with triggers will be explored. The purpose of this lesson is to provide patients with the opportunity to review the basic principles of the Pritikin Eating Plan, discuss the value of eating mindfully and how to measure internal cues of hunger and fullness using the Hunger Scale. Patients will also discuss reasons for non-hunger eating and learn strategies to use for controlling emotional eating.  Targeting Your Nutrition Priorities Clinical staff led group instruction and group discussion with PowerPoint presentation and patient guidebook. To enhance the learning environment the use of posters, models and videos may be added. Patients will learn how to determine their genetic susceptibility to disease by reviewing their family history. Patients will gain insight into the importance of diet as part of an overall healthy lifestyle in mitigating the impact of genetics and other environmental insults. The purpose of this lesson is to provide patients with the opportunity to assess their personal nutrition priorities by looking at their family history, their own health history and current risk factors. Patients will also be able to discuss ways of prioritizing and modifying the Pritikin Eating Plan for their highest risk areas  Menu  Clinical staff led group instruction and group discussion with PowerPoint presentation and patient guidebook. To enhance the learning environment the use of posters, models and videos may be added. Using menus brought in from [employer],  or printed from [employer], patients will apply the Pritikin dining out guidelines that were presented in the [employer] video. Patients will also be able to practice these guidelines in a variety of provided scenarios. The purpose of this lesson is to provide patients with the opportunity to practice hands-on learning of the Pritikin Dining Out guidelines with actual menus and practice scenarios.  Label Reading Clinical staff led group instruction and group discussion with PowerPoint presentation and patient guidebook. To enhance the learning environment the use of posters, models and videos may be added. Patients will review and discuss the Pritikin label reading guidelines presented in Pritikin's Label Reading Educational series video. Using fool labels brought in from local grocery stores and markets, patients will apply the label reading guidelines and determine if the packaged food meet the Pritikin guidelines. The purpose of this lesson is to provide patients with the opportunity to review, discuss, and practice hands-on learning of the Pritikin Label Reading guidelines with actual packaged food labels. Cooking School  Pritikin's [employer] are designed to teach patients ways to prepare quick, simple, and affordable recipes at home. The importance of nutrition's role  in chronic disease risk reduction is reflected in its emphasis in the overall Pritikin program. By learning how to prepare essential core Pritikin Eating Plan recipes, patients will increase control over what they eat; be able to customize the flavor of foods without the use of added salt, sugar, or fat; and improve the quality of the food they consume. By learning a set of core recipes which are easily assembled, quickly prepared, and affordable, patients are more likely to prepare more healthy foods at home. These workshops focus on convenient breakfasts, simple entres, side dishes, and desserts which  can be prepared with minimal effort and are consistent with nutrition recommendations for cardiovascular risk reduction. Cooking [employer] are taught by a Armed forces logistics/support/administrative officer (RD) who has been trained by the [employer]. The chef or RD has a clear understanding of the importance of minimizing - if not completely eliminating - added fat, sugar, and sodium in recipes. Throughout the series of Cooking School Workshop sessions, patients will learn about healthy ingredients and efficient methods of cooking to build confidence in their capability to prepare    Cooking School weekly topics:  Adding Flavor- Sodium-Free  Fast and Healthy Breakfasts  Powerhouse Plant-Based Proteins  Satisfying Salads and Dressings  Simple Sides and Sauces  International Cuisine-Spotlight on the [employer] Zones  Delicious Desserts  Savory Soups  [employer] - Meals in a Astronomer Appetizers and Snacks  Comforting Weekend Breakfasts  One-Pot Wonders   Fast Evening Meals  Landscape architect Your Pritikin Plate  WORKSHOPS   Healthy Mindset (Psychosocial):  Focused Goals, Sustainable Changes Clinical staff led group instruction and group discussion with PowerPoint presentation and patient guidebook. To enhance the learning environment the use of posters, models and videos may be added. Patients will be able to apply effective goal setting strategies to establish at least one personal goal, and then take consistent, meaningful action toward that goal. They will learn to identify common barriers to achieving personal goals and develop strategies to overcome them. Patients will also gain an understanding of how our mind-set can impact our ability to achieve goals and the importance of cultivating a positive and growth-oriented mind-set. The purpose of this lesson is to provide patients with a deeper understanding of how to set and achieve personal goals, as well as the tools  and strategies needed to overcome common obstacles which may arise along the way.  From Head to Heart: The Power of a Healthy Outlook  Clinical staff led group instruction and group discussion with PowerPoint presentation and patient guidebook. To enhance the learning environment the use of posters, models and videos may be added. Patients will be able to recognize and describe the impact of emotions and mood on physical health. They will discover the importance of self-care and explore self-care practices which may work for them. Patients will also learn how to utilize the 4 C's to cultivate a healthier outlook and better manage stress and challenges. The purpose of this lesson is to demonstrate to patients how a healthy outlook is an essential part of maintaining good health, especially as they continue their cardiac rehab journey.  Healthy Sleep for a Healthy Heart Clinical staff led group instruction and group discussion with PowerPoint presentation and patient guidebook. To enhance the learning environment the use of posters, models and videos may be added. At the conclusion of this workshop, patients will be able to demonstrate knowledge of the importance of sleep to overall health,  well-being, and quality of life. They will understand the symptoms of, and treatments for, common sleep disorders. Patients will also be able to identify daytime and nighttime behaviors which impact sleep, and they will be able to apply these tools to help manage sleep-related challenges. The purpose of this lesson is to provide patients with a general overview of sleep and outline the importance of quality sleep. Patients will learn about a few of the most common sleep disorders. Patients will also be introduced to the concept of "sleep hygiene," and discover ways to self-manage certain sleeping problems through simple daily behavior changes. Finally, the workshop will motivate patients by clarifying the links between  quality sleep and their goals of heart-healthy living.   Recognizing and Reducing Stress Clinical staff led group instruction and group discussion with PowerPoint presentation and patient guidebook. To enhance the learning environment the use of posters, models and videos may be added. At the conclusion of this workshop, patients will be able to understand the types of stress reactions, differentiate between acute and chronic stress, and recognize the impact that chronic stress has on their health. They will also be able to apply different coping mechanisms, such as reframing negative self-talk. Patients will have the opportunity to practice a variety of stress management techniques, such as deep abdominal breathing, progressive muscle relaxation, and/or guided imagery.  The purpose of this lesson is to educate patients on the role of stress in their lives and to provide healthy techniques for coping with it.  Learning Barriers/Preferences:  Learning Barriers/Preferences - 07/26/23 1425       Learning Barriers/Preferences   Learning Barriers None    Learning Preferences Audio;Computer/Internet;Group Instruction;Skilled Demonstration;Verbal Instruction;Video;Written Material;Pictoral;Individual Instruction             Education Topics:  Knowledge Questionnaire Score:  Knowledge Questionnaire Score - 07/26/23 1425       Knowledge Questionnaire Score   Pre Score 24/24             Core Components/Risk Factors/Patient Goals at Admission:  Personal Goals and Risk Factors at Admission - 07/26/23 1432       Core Components/Risk Factors/Patient Goals on Admission    Weight Management Yes;Obesity;Weight Loss    Intervention Weight Management: Develop a combined nutrition and exercise program designed to reach desired caloric intake, while maintaining appropriate intake of nutrient and fiber, sodium and fats, and appropriate energy expenditure required for the weight goal.;Weight  Management: Provide education and appropriate resources to help participant work on and attain dietary goals.;Weight Management/Obesity: Establish reasonable short term and long term weight goals.;Obesity: Provide education and appropriate resources to help participant work on and attain dietary goals.    Goal Weight: Long Term 170 lb (77.1 kg)   pt goal   Heart Failure Yes    Intervention Provide a combined exercise and nutrition program that is supplemented with education, support and counseling about heart failure. Directed toward relieving symptoms such as shortness of breath, decreased exercise tolerance, and extremity edema.    Expected Outcomes Improve functional capacity of life;Short term: Attendance in program 2-3 days a week with increased exercise capacity. Reported lower sodium intake. Reported increased fruit and vegetable intake. Reports medication compliance.;Short term: Daily weights obtained and reported for increase. Utilizing diuretic protocols set by physician.;Long term: Adoption of self-care skills and reduction of barriers for early signs and symptoms recognition and intervention leading to self-care maintenance.    Hypertension Yes    Intervention Provide education on lifestyle modifcations including regular  physical activity/exercise, weight management, moderate sodium restriction and increased consumption of fresh fruit, vegetables, and low fat dairy, alcohol moderation, and smoking cessation.;Monitor prescription use compliance.    Expected Outcomes Short Term: Continued assessment and intervention until BP is < 140/84mm HG in hypertensive participants. < 130/48mm HG in hypertensive participants with diabetes, heart failure or chronic kidney disease.;Long Term: Maintenance of blood pressure at goal levels.    Lipids Yes    Intervention Provide education and support for participant on nutrition & aerobic/resistive exercise along with prescribed medications to achieve LDL 70mg ,  HDL >40mg .    Expected Outcomes Short Term: Participant states understanding of desired cholesterol values and is compliant with medications prescribed. Participant is following exercise prescription and nutrition guidelines.;Long Term: Cholesterol controlled with medications as prescribed, with individualized exercise RX and with personalized nutrition plan. Value goals: LDL < 70mg , HDL > 40 mg.    Stress Yes    Intervention Offer individual and/or small group education and counseling on adjustment to heart disease, stress management and health-related lifestyle change. Teach and support self-help strategies.;Refer participants experiencing significant psychosocial distress to appropriate mental health specialists for further evaluation and treatment. When possible, include family members and significant others in education/counseling sessions.    Expected Outcomes Short Term: Participant demonstrates changes in health-related behavior, relaxation and other stress management skills, ability to obtain effective social support, and compliance with psychotropic medications if prescribed.;Long Term: Emotional wellbeing is indicated by absence of clinically significant psychosocial distress or social isolation.             Core Components/Risk Factors/Patient Goals Review:   Goals and Risk Factor Review     Row Name 08/11/23 4098 08/19/23 1537 09/21/23 0958 10/12/23 0908       Core Components/Risk Factors/Patient Goals Review   Personal Goals Review Weight Management/Obesity;Heart Failure;Lipids;Hypertension;Stress;Improve shortness of breath with ADL's Weight Management/Obesity;Heart Failure;Lipids;Hypertension;Stress;Improve shortness of breath with ADL's Weight Management/Obesity;Heart Failure;Lipids;Hypertension;Stress;Improve shortness of breath with ADL's Weight Management/Obesity;Heart Failure;Lipids;Hypertension;Stress;Improve shortness of breath with ADL's    Review Zoeie started cardiac  rehab on 07/28/23. Keliyah is off to a good start to exercise. oxygen saturatiobns have been consistantly dropping into the mid 80's. Order obtained to use oxygen with exercise. Patient agreeable.  Will use oxygen on next scheduled visit. Ronnesha is enjoying participating in cardiac rehab so far. Kodee is doing well with exercise at  cardiac rehab. Vital signs and oxygen saturations have been stable now that [place] is wearing oxygen with exercise. oxygen saturations have been in the low to mid 90's on 2l/min. Nickey has an upcoming appointment with her pulmonolgist in March. Milla is doing well with exercise at  cardiac rehab. Vital signs and oxygen saturations have been stable now that [place] is wearing oxygen with exercise. Oxygen saturations have remained in the low to mid 90's on 2l/min. [place] would benefit from participating in pulmonary rehab when she completes cardiac rehab. [place] says she is more interested in water aerobics. [place] has lost 1.7 kg since starting cardiac rehab. Bekah is doing well with exercise at  cardiac rehab. Vital signs have been stable.  Oxygen saturations have remained in the upper 80's to  mid 90's on 2l/min. Madison West would benefit from participating in pulmonary rehab when she completes cardiac rehab. Madison West says she is more interested in water aerobics. Madison West has lost 2.0 kg since starting cardiac rehab. Will notify Dr Filiberto Hug about oxygen saturations.    Expected Outcomes Tarini will continue to participate in cardiac rehab for exercise,  nutrition and lifestyle modifications Avanelle will continue to participate in cardiac rehab for exercise, nutrition and lifestyle modifications Shanieka will continue to participate in cardiac rehab for exercise, nutrition and lifestyle modifications Shylin will continue to participate in cardiac rehab for exercise, nutrition and lifestyle modifications             Core Components/Risk Factors/Patient Goals at Discharge (Final Review):   Goals and  Risk Factor Review - 10/12/23 0908       Core Components/Risk Factors/Patient Goals Review   Personal Goals Review Weight Management/Obesity;Heart Failure;Lipids;Hypertension;Stress;Improve shortness of breath with ADL's    Review Daniel is doing well with exercise at  cardiac rehab. Vital signs have been stable.  Oxygen saturations have remained in the upper 80's to  mid 90's on 2l/min. Madison West would benefit from participating in pulmonary rehab when she completes cardiac rehab. Madison West says she is more interested in water aerobics. Madison West has lost 2.0 kg since starting cardiac rehab. Will notify Dr Filiberto Hug about oxygen saturations.    Expected Outcomes Glenora will continue to participate in cardiac rehab for exercise, nutrition and lifestyle modifications             ITP Comments:  ITP Comments     Row Name 07/26/23 1417 08/19/23 1533 09/21/23 0954 10/12/23 0904     ITP Comments Dr. Gaylyn Keas medical director. Introduction to pritikin education/intensive cardiac rehab. Initial orientation packet reviewed with patient. 30 Day ITP Review. Madison West has good attendance and participation with exercise at cardiac rehab. Madison West is off to a good start to exercise for her fitness level. 30 Day ITP Review. Madison West continues to have good attendance and participation with exercise at cardiac rehab. 30 Day ITP Review. Madison West continues to have good attendance and participation with exercise at cardiac rehab. Madison West will complete cardiac rehab in the begining of May.             Comments: See ITP comments.Monte Antonio RN BSN

## 2023-10-13 ENCOUNTER — Encounter (HOSPITAL_COMMUNITY)
Admission: RE | Admit: 2023-10-13 | Discharge: 2023-10-13 | Disposition: A | Discharge status: Home / Self Care | Payer: Medicare Other | Source: Ambulatory Visit | Attending: Cardiology | Admitting: Cardiology

## 2023-10-13 DIAGNOSIS — Z952 Presence of prosthetic heart valve: Secondary | ICD-10-CM | POA: Diagnosis not present

## 2023-10-13 DIAGNOSIS — I2102 ST elevation (STEMI) myocardial infarction involving left anterior descending coronary artery: Secondary | ICD-10-CM

## 2023-10-13 DIAGNOSIS — Z955 Presence of coronary angioplasty implant and graft: Secondary | ICD-10-CM

## 2023-10-15 ENCOUNTER — Telehealth (HOSPITAL_COMMUNITY): Payer: Self-pay

## 2023-10-15 ENCOUNTER — Encounter (HOSPITAL_COMMUNITY): Payer: Medicare Other

## 2023-10-15 NOTE — Telephone Encounter (Signed)
 Pt wanted to extend her cardiac rehab to 5/19

## 2023-10-18 ENCOUNTER — Encounter (HOSPITAL_COMMUNITY)
Admission: RE | Admit: 2023-10-18 | Discharge: 2023-10-18 | Disposition: A | Discharge status: Home / Self Care | Payer: Medicare Other | Source: Ambulatory Visit | Attending: Cardiology

## 2023-10-18 DIAGNOSIS — Z952 Presence of prosthetic heart valve: Secondary | ICD-10-CM | POA: Diagnosis not present

## 2023-10-18 DIAGNOSIS — I2102 ST elevation (STEMI) myocardial infarction involving left anterior descending coronary artery: Secondary | ICD-10-CM

## 2023-10-18 DIAGNOSIS — Z955 Presence of coronary angioplasty implant and graft: Secondary | ICD-10-CM

## 2023-10-20 ENCOUNTER — Encounter (HOSPITAL_COMMUNITY)
Admission: RE | Admit: 2023-10-20 | Discharge: 2023-10-20 | Disposition: A | Discharge status: Home / Self Care | Source: Ambulatory Visit | Attending: Cardiology | Admitting: Cardiology

## 2023-10-20 ENCOUNTER — Encounter (HOSPITAL_COMMUNITY): Admission: RE | Admit: 2023-10-20 | Payer: Medicare Other | Source: Ambulatory Visit

## 2023-10-20 DIAGNOSIS — Z952 Presence of prosthetic heart valve: Secondary | ICD-10-CM

## 2023-10-20 DIAGNOSIS — Z955 Presence of coronary angioplasty implant and graft: Secondary | ICD-10-CM

## 2023-10-20 DIAGNOSIS — I2102 ST elevation (STEMI) myocardial infarction involving left anterior descending coronary artery: Secondary | ICD-10-CM

## 2023-10-21 ENCOUNTER — Telehealth: Payer: Self-pay

## 2023-10-21 ENCOUNTER — Telehealth (HOSPITAL_COMMUNITY): Payer: Self-pay | Admitting: *Deleted

## 2023-10-21 ENCOUNTER — Telehealth: Payer: Self-pay | Admitting: Cardiology

## 2023-10-21 NOTE — Telephone Encounter (Signed)
-----   Message from Nurse Ruthann Cover S sent at 10/21/2023 11:05 AM EDT ----- Regarding: FW: Please let Dr. Meridee Standing know about patient's decreased oxygen saturation during exercise. Spoke to patient and advised to be sure to keep Pharm-D appointment.   Shelagh Derrick,  Please review message from Dr. Filiberto Hug. ----- Message ----- From: Cody Das, MD Sent: 10/21/2023  11:00 AM EDT To: Evan Hillock, RN Subject: RE: Please let Dr. Meridee Standing know about patient's d#  We need to aggressively uptitrate her heart failure medical therapy.  She has appointment to see Pharm.D. clinic on 11/03/2023.  In addition, she may need 6-minute walk test and possibly supplemental oxygen.  Defer this to her pulmonologist.  Thanks MJP ----- Message ----- From: Evan Hillock, RN Sent: 10/20/2023   2:30 PM EDT To: Cody Das, MD Subject: FW: Please let Dr. Meridee Standing know about patient's d#   ----- Message ----- From: Knox Perl, MD Sent: 10/12/2023   9:12 PM EDT To: Evan Hillock, RN Subject: Please let Dr. Meridee Standing know about patient's decre#   ----- Message ----- From: Cyndee Dragon, RN Sent: 10/12/2023   9:26 AM EDT To: Cody Das, MD; Phyllis Breeze, MD  Good morning Dr Filiberto Hug, I want to bring it to your attention that [employer]" Oxygen saturations have remained in the upper 80's to mid 90's on 2l/min. Please see attached media.  [name] would benefit from participating in pulmonary rehab when she completes cardiac rehab. Shaquania does not wear oxygen outside of cardiac rehab she would probably benefit from using oxygen all the time. Clovis is resistant to using oxygen.  Jullissa is changing pulmonologist and has an appointment to see Dr Lisette Ridgel in May. Dezera is enjoying participating in cardiac rehab her recent weights have been stable. Thank you Sincerely, [facility]

## 2023-10-21 NOTE — Telephone Encounter (Signed)
 Copied from CRM [id number]. Topic: General - Other >> Oct 19, 2023 11:36 AM Hilton Lucky wrote: Reason for CRM: Patient states Dr. Waymond Hailey never addressed his concerns. Patient wants to see Dr. Marygrace Snellen for his expertise but wants validation that Dr. Waylan Haggard is qualified to treat her for her problem. Patient is complaining of being ignored and being pushed off to the wayside. Patient wants to see the most qualified provider ASAP, as she feels nine months is too long for her issue to be addressed  Spoke with patient regarding prior message. Advised patient that she is seeing Dr.Mannam on 10/25/2023. Patient has been coughing up looks like a small blood clot after patient has gotten off the vent . Advised patient she will be seeing a good doctor on 10/25/2023.Patient's voice was understanding.Nothing else further needed.

## 2023-10-21 NOTE — Telephone Encounter (Signed)
 Patient identification verified by 2 forms. Madison Lucky, RN    Called and spoke to patient  Patient states:   -was speaking to nurse Ruthann Cover, call was disconnected   -call reviewed some information from Dr. Filiberto Hug   -she continues to have some blood when coughing on going for 9 months   -she feels like nothing is being done regarding her concerns   -she saw a pulmonologist but that was a waste of her time   -she has pulmonology appointment on 5/5 Informed Patient:   -RN  unsure what was discussed during previous call  Relayed provider message below:     Advised patient:   -Keep 5/5 OV with pulmonology   -Keep 5/14 nurse visit  Informed patient message sent to RN [facility] incase additional information needs to be reviewed. If additional information not needed keep plan as discussed during call.  Patient verbalized understanding, no questions at this time

## 2023-10-21 NOTE — Telephone Encounter (Signed)
 Pt was on the phone with a nurse and got disconnected. She is calling back to speak to that nurse but she is not sure of the name.

## 2023-10-22 ENCOUNTER — Encounter (HOSPITAL_COMMUNITY): Payer: Medicare Other

## 2023-10-25 ENCOUNTER — Telehealth: Payer: Self-pay | Admitting: Cardiology

## 2023-10-25 ENCOUNTER — Encounter: Payer: Self-pay | Admitting: Pulmonary Disease

## 2023-10-25 ENCOUNTER — Encounter (HOSPITAL_COMMUNITY)
Admission: RE | Admit: 2023-10-25 | Discharge: 2023-10-25 | Disposition: A | Discharge status: Home / Self Care | Payer: Medicare Other | Source: Ambulatory Visit | Attending: Cardiology | Admitting: Cardiology

## 2023-10-25 ENCOUNTER — Ambulatory Visit (INDEPENDENT_AMBULATORY_CARE_PROVIDER_SITE_OTHER): Admitting: Pulmonary Disease

## 2023-10-25 VITALS — BP 120/64 | HR 82 | Ht 68.0 in | Wt 195.8 lb

## 2023-10-25 DIAGNOSIS — Z87891 Personal history of nicotine dependence: Secondary | ICD-10-CM | POA: Diagnosis not present

## 2023-10-25 DIAGNOSIS — Z952 Presence of prosthetic heart valve: Secondary | ICD-10-CM

## 2023-10-25 DIAGNOSIS — R0609 Other forms of dyspnea: Secondary | ICD-10-CM | POA: Diagnosis not present

## 2023-10-25 DIAGNOSIS — I5020 Unspecified systolic (congestive) heart failure: Secondary | ICD-10-CM | POA: Diagnosis not present

## 2023-10-25 DIAGNOSIS — Z955 Presence of coronary angioplasty implant and graft: Secondary | ICD-10-CM | POA: Diagnosis present

## 2023-10-25 DIAGNOSIS — R042 Hemoptysis: Secondary | ICD-10-CM | POA: Diagnosis not present

## 2023-10-25 DIAGNOSIS — I2102 ST elevation (STEMI) myocardial infarction involving left anterior descending coronary artery: Secondary | ICD-10-CM

## 2023-10-25 NOTE — Telephone Encounter (Signed)
-----   Message from [facility] sent at 10/25/2023 12:13 PM EDT ----- I am getting a CT chest to evaluate hemoptysis.  Will likely need a bronchoscopy for airway inspection and biopsy if needed.  Is she cleared from cardiology standpoint to hold Plavix  before procedure and to undergo general anesthesia?  Thanks Praveen

## 2023-10-25 NOTE — Progress Notes (Signed)
 Madison West    [id number]    1955/10/01  Primary Care Physician:White, Vila Grayer, NP  Referring Physician: Sallye Crease, NP [phone] [place],  [place] [address]  Chief complaint: Consult for hemoptysis  HPI: 68 y.o. who  has a past medical history of Anxiety, Arthritis, Breast cancer (HCC) (06/02/07), Carpal tunnel syndrome, Colon polyps, Complication of anesthesia, Coronary artery disease, Depression, Dysplasia of vulva, Hypertension, Palpitations, S/P breast lumpectomy (07/04/07), and S/P radiation therapy (2009).  Discussed the use of AI scribe software for clinical note transcription with the patient, who gave verbal consent to proceed.  History of Present Illness Madison West "Madison West" is a 68 year old female with heart failure and aortic stenosis who presents with persistent hemoptysis.  She has been experiencing hemoptysis for nine months, characterized by coughing up burgundy-colored blood clots approximately three times a day. This began after liberation from the right due to right six-week hospitalization in August for heart failure, MI, severe aortic stenosis, during which she underwent aortic valve replacement (TAVR) and stent placement.  Her hospitalization was due to heart failure symptoms, including shortness of breath, fluid retention, and severe heartburn. Prior to this, she was treated for pneumonia with antibiotics multiple times. During the hospital stay, she experienced cardiac arrest and was treated with a balloon pump. She was discharged with a diagnosis of severe aortic stenosis status post TAVR and a reduced ejection fraction of 30-35%.  Also diagnosed with a left IJ clot secondary to central venous line for which she received over 6 months of anticoagulation therapy.  She follows with Dr. Filiberto Hug.  She is currently on Plavix  and aspirin , having stopped Eliquis  and amiodarone  in March of 2025. No recent travel, exposure to mold, or  use of feather pillows. She has not experienced recent acid reflux or indigestion, except when consuming raw onions.  She has a history of smoking, with a 15 pack-year history, but quit approximately four years ago. Her medical history also includes breast cancer treated in 2009 and a carotid artery procedure in 2020. Her family history is significant for colon cancer in her mother and a massive heart attack in her father.   Pets: Cats, dogs, horses.  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  Husband had a feather pillow around 2010.  No ongoing exposure Smoking history: 15-pack-year smoker.  Quit in 1998 Travel history: Originally from [place] .  No significant recent travel Relevant family history: No family history of lung disease  Outpatient Encounter Medications as of 10/25/2023  Medication Sig   acetaminophen  (TYLENOL ) 325 MG tablet Take 2 tablets (650 mg total) by mouth every 6 (six) hours. (Patient taking differently: Take 500 mg by mouth every 6 (six) hours.)   alprazolam  (XANAX ) 2 MG tablet Take 0.25-2 mg by mouth See admin instructions. 1 mg (may take up to 2 mg) twice daily. May also take 0.25 mg during the day if needed for anxiety.   aspirin  EC 81 MG tablet Take 1 tablet (81 mg total) by mouth daily. Swallow whole.   busPIRone  (BUSPAR ) 10 MG tablet TAKE 1 TABLET BY MOUTH TWICE A DAY (Patient taking differently: Take 5 mg by mouth 2 (two) times daily.)   clopidogrel  (PLAVIX ) 75 MG tablet Take 1 tablet (75 mg total) by mouth daily.   dapagliflozin  propanediol (FARXIGA ) 10 MG TABS tablet Take 1 tablet (10 mg total)  by mouth daily before breakfast.   furosemide  (LASIX ) 20 MG tablet Take 0.5 tablets (10 mg total) by mouth daily as needed (for lower extremity swelling and weight gain).   methocarbamol  (ROBAXIN ) 500 MG tablet Take 1 tablet (500 mg total) by mouth 2 (two) times daily.   nitroGLYCERIN  (NITROSTAT ) 0.4 MG SL  tablet Place 1 tablet (0.4 mg total) under the tongue every 5 (five) minutes as needed for chest pain.   oxyCODONE  (ROXICODONE ) 5 MG immediate release tablet Take 1 tablet (5 mg total) by mouth daily as needed for severe pain (pain score 7-10).   rosuvastatin  (CRESTOR ) 20 MG tablet Take 0.5 tablets (10 mg total) by mouth daily.   spironolactone  (ALDACTONE ) 25 MG tablet Take 0.5 tablets (12.5 mg total) by mouth daily.   ezetimibe  (ZETIA ) 10 MG tablet Take 1 tablet (10 mg total) by mouth daily. (Patient not taking: Reported on 09/08/2023)   losartan  (COZAAR ) 25 MG tablet Take 1 tablet (25 mg total) by mouth daily.   No facility-administered encounter medications on file as of 10/25/2023.    Allergies as of 10/25/2023 - Review Complete 10/25/2023  Allergen Reaction Noted   Prednisone Palpitations 06/20/2012   Fentanyl  Swelling and Other (See Comments) 08/23/2015   Versed  [midazolam ] Swelling and Anxiety 10/22/2015   Vicodin [hydrocodone -acetaminophen ] Anxiety 11/10/2011    Past Medical History:  Diagnosis Date   Anxiety    Arthritis    low back and hip pain intermittent   Breast cancer (HCC) 06/02/07   r breast -surgery ,radiaology. chemotherapy   Carpal tunnel syndrome    right hand   Colon polyps    Complication of anesthesia    Fentanyl , Versed -makes extra hyper, bradycardia x 1 in PACU, [facility] (08/15/11 cardiology felt neostigmine may have resulted in AV nodal block)    Coronary artery disease    Depression    denies   Dysplasia of vulva    Hypertension    Palpitations    PSVT, s/p adenosine  08/04/16   S/P breast lumpectomy 07/04/07   R breast   S/P radiation therapy 2009    Past Surgical History:  Procedure Laterality Date   APPENDECTOMY     age 20   CARDIAC CATHETERIZATION     CESAREAN SECTION     x 2   COLONOSCOPY W/ POLYPECTOMY     COLONOSCOPY WITH PROPOFOL  N/A 04/27/2016   Procedure: COLONOSCOPY WITH PROPOFOL ;  Surgeon: Garrett Kallman, MD;  Location: [facility];   Service: Endoscopy;  Laterality: N/A;   CORONARY STENT INTERVENTION N/A 01/29/2023   Procedure: CORONARY STENT INTERVENTION;  Surgeon: Cody Das, MD;  Location: [facility];  Service: Cardiovascular;  Laterality: N/A;   CORONARY THROMBECTOMY N/A 01/31/2023   Procedure: Coronary Thrombectomy;  Surgeon: Cody Das, MD;  Location: [facility];  Service: Cardiovascular;  Laterality: N/A;   CORONARY ULTRASOUND/IVUS N/A 01/31/2023   Procedure: Coronary Ultrasound/IVUS;  Surgeon: Cody Das, MD;  Location: [facility];  Service: Cardiovascular;  Laterality: N/A;   CORONARY/GRAFT ACUTE MI REVASCULARIZATION N/A 01/31/2023   Procedure: Coronary/Graft Acute MI Revascularization;  Surgeon: Cody Das, MD;  [facility]: [facility];  Service: Cardiovascular;  Laterality: N/A;   DILATION AND CURETTAGE OF UTERUS     multiple   ENDARTERECTOMY Right 06/09/2019   ENDARTERECTOMY Right 06/09/2019   Procedure: ENDARTERECTOMY CAROTID RIGHT;  Surgeon: Richrd Char, MD;  Location: [facility];  Service: Vascular;  Laterality: Right;   IABP INSERTION  N/A 02/02/2023   Procedure: IABP Insertion;  Surgeon: Cody Das, MD;  [facility];  Service: Cardiovascular;  Laterality: N/A;   INTRAUTERINE DEVICE INSERTION     IUD REMOVAL     LEFT HEART CATH AND CORONARY ANGIOGRAPHY N/A 02/27/2020   Procedure: LEFT HEART CATH AND CORONARY ANGIOGRAPHY;  Surgeon: Knox Perl, MD;  [facility];  Service: Cardiovascular;  Laterality: N/A;   LEFT HEART CATH AND CORONARY ANGIOGRAPHY N/A 01/31/2023   Procedure: LEFT HEART CATH AND CORONARY ANGIOGRAPHY;  Surgeon: Cody Das, MD;  [facility];  Service: Cardiovascular;  Laterality: N/A;   MASTECTOMY PARTIAL / LUMPECTOMY W/ AXILLARY LYMPHADENECTOMY Right    lumpectomy and lymph nodes removed   PATCH ANGIOPLASTY Right 06/09/2019   Procedure: Patch Angioplasty  of right carotid artery using hemashield paltinum finesse patch;  Surgeon: Richrd Char, MD;  Location[facility];  Service: Vascular;  Laterality: Right;   RIGHT AND LEFT HEART CATH N/A 02/01/2023   Procedure: RIGHT AND LEFT HEART CATH;  Surgeon: Cody Das, MD;  [facility];  Service: Cardiovascular;  Laterality: N/A;   RIGHT HEART CATH N/A 02/02/2023   Procedure: RIGHT HEART CATH;  Surgeon: Mardell Shade, MD;  [facility];  Service: Cardiovascular;  Laterality: N/A;   RIGHT/LEFT HEART CATH AND CORONARY ANGIOGRAPHY N/A 01/27/2023   Procedure: RIGHT/LEFT HEART CATH AND CORONARY ANGIOGRAPHY;  Surgeon: Cody Das, MD;  [facility];  Service: Cardiovascular;  Laterality: N/A;   TEE WITHOUT CARDIOVERSION N/A 02/04/2023   Procedure: TRANSESOPHAGEAL ECHOCARDIOGRAM;  Surgeon: Kyra Phy, MD;  Location: [facility] INVASIVE CV LAB;  Service: Open Heart Surgery;  Laterality: N/A;   TEMPORARY PACEMAKER N/A 02/02/2023   Procedure: TEMPORARY PACEMAKER;  Surgeon: Mardell Shade, MD;  [facility];  Service: Cardiovascular;  Laterality: N/A;   TRANSCATHETER AORTIC VALVE REPLACEMENT, TRANSFEMORAL N/A 02/04/2023   Procedure: Transcatheter Aortic Valve Replacement, Transfemoral;  Surgeon: Thukkani, Arun K, MD;  [facility]: [facility];  Service: Open Heart Surgery;  Laterality: N/A;   TUBAL LIGATION     VULVA SURGERY     Multiple times for dysplasia    Family History  Problem Relation Age of Onset   Colon cancer Mother 58   Heart disease Father        heart attack   Colon polyps Brother        one brother had large colon polyp that needed surgery to be removed   Heart disease Paternal Grandfather    Diabetes type II Brother     Social History   Socioeconomic History   Marital status: Widowed    Spouse name: Not on file   Number of children: 2   Years of education: Not on file   Highest education  level: Not on file  Occupational History   Occupation: server    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  Tobacco Use   Smoking status: Former    Current packs/day: 0.00    Average packs/day: 0.8 packs/day for 20.0 years (15.0 ttl pk-yrs)    Types: Cigarettes    Start date: 05/06/1977    Quit date: 05/06/1997    Years since quitting: 26.4   Smokeless tobacco: Never  Vaping Use   Vaping status: Never Used  Substance and Sexual Activity   Alcohol use: Not Currently    Comment: has't had a drink in 9 weeks  Drug use: No   Sexual activity: Not Currently  Other Topics Concern   Not on file  Social History Narrative   Not on file   Social Drivers of Health   Financial Resource Strain: Low Risk  (03/10/2023)   Received from [facility]   Overall Financial Resource Strain (CARDIA)    Difficulty of Paying Living Expenses: Not hard at all  Food Insecurity: No Food Insecurity (03/10/2023)   [facility]   Hunger Vital Sign    Worried About Running Out of Food in the Last Year: Never true    Ran Out of Food in the Last Year: Never true  Transportation Needs: No Transportation Needs (03/10/2023)   Received from [facility] - Transportation    Lack of Transportation (Medical): No    Lack of Transportation (Non-Medical): No  Physical Activity: Not on file  Stress: Not on file  Social Connections: Unknown (03/05/2023)   [facility] from [facility]   Social Network    Social Network: Not on file  Intimate Partner Violence: Unknown (03/05/2023)   Received from [facility]   HITS    Physically Hurt: Not on file    Insult or Talk Down To: Not on file    Threaten Physical Harm: Not on file    Scream or Curse: Not on file    Review of systems: Review of Systems  Constitutional: Negative for fever and chills.  HENT: Negative.   Eyes: Negative for blurred vision.  Respiratory: as per HPI  Cardiovascular: Negative for chest pain and palpitations.  Gastrointestinal:  Negative for vomiting, diarrhea, blood per rectum. Genitourinary: Negative for dysuria, urgency, frequency and hematuria.  Musculoskeletal: Negative for myalgias, back pain and joint pain.  Skin: Negative for itching and rash.  Neurological: Negative for dizziness, tremors, focal weakness, seizures and loss of consciousness.  Endo/Heme/Allergies: Negative for environmental allergies.  Psychiatric/Behavioral: Negative for depression, suicidal ideas and hallucinations.  All other systems reviewed and are negative.  Physical Exam: Blood pressure 120/64, pulse 82, height 5\' 8"  (1.727 m), weight 195 lb 12.8 oz (88.8 kg), SpO2 91%. Gen:      No acute distress HEENT:  EOMI, sclera anicteric Neck:     No masses; no thyromegaly Lungs:    Clear to auscultation bilaterally; normal respiratory effort CV:         Regular rate and rhythm; no murmurs Abd:      + bowel sounds; soft, non-tender; no palpable masses, no distension Ext:    No edema; adequate peripheral perfusion Skin:      Warm and dry; no rash Neuro: alert and oriented x 3 Psych: normal mood and affect  Data Reviewed: Imaging: CTA 10/16/2022-no pulm embolism, mild bronchial wall thickening, emphysema, coronary artery calcifications I have reviewed the images personally.  PFTs:  Labs: Assessment & Plan Hemoptysis Chronic hemoptysis for nine months, with dark burgundy blood clots, post-hospitalization and extubation.  Bleeding persists after Eliquis  was stopped in March 2025.  Possible causes include anticoagulation with Plavix , residual intubation effects, or other pulmonary pathology. Differential includes bleeding from Plavix , airway lesions, or other pulmonary issues. Significant concern expressed about potential causes.  - Order chest CT to assess for lesions or masses. - Coordinate with Dr. Filiberto Hug, cardiologist to evaluate safety for bronchoscopy under general anesthesia and stopping Plavix  prior to procedure - Schedule  bronchoscopy pending cardiologist approval.  Heart failure with reduced ejection fraction Post-transcatheter aortic valve replacement (TAVR) for severe aortic stenosis.  Follows with Dr. Filiberto Hug, cardiology  Follow-up - Schedule CT scan in [place] as soon as possible. - Coordinate with cardiologist for bronchoscopy scheduling. - Ensure transportation for bronchoscopy due to anesthesia requirements.  Recommendations: CT of chest Cardiology clearance for bronchoscopy  Phyllis Breeze MD [facility] 10/25/2023, 11:54 AM  CC: Sallye Crease, NP

## 2023-10-25 NOTE — Patient Instructions (Signed)
 VISIT SUMMARY:  During your visit, we discussed your persistent coughing up of blood (hemoptysis) that has been ongoing for nine months. This started after your hospitalization for heart failure, during which you had an aortic valve replacement and stent placement. We reviewed your medical history, including your past smoking, heart failure, aortic stenosis, and previous treatments for breast cancer and carotid artery surgery. We also talked about your goals to understand the cause of your symptoms and to pursue necessary diagnostic procedures.  YOUR PLAN:  -HEMOPTYSIS: Hemoptysis means coughing up blood. We are concerned about the possible causes, which could include the blood thinner Plavix , effects from your previous intubation, or other lung issues. We will order a chest CT scan to look for any lesions or masses in your lungs. We will also coordinate with your cardiologist to ensure it is safe for you to undergo a bronchoscopy, a procedure to look inside your airways, under general anesthesia.  -SMOKING CESSATION: You have a history of smoking but quit four years ago. While the likelihood of smoking-related lung disease is low, we still need to evaluate this given your symptoms.  -HEART FAILURE WITH REDUCED EJECTION FRACTION: Heart failure with reduced ejection fraction means your heart is not pumping as well as it should. This condition requires careful consideration for any procedures involving anesthesia. We will consult with your cardiologist to ensure it is safe for you to undergo the bronchoscopy.  -SEVERE AORTIC STENOSIS, STATUS POST TAVR: Severe aortic stenosis is a narrowing of the aortic valve, which you had treated with a transcatheter aortic valve replacement (TAVR). We need to monitor this condition during any procedures. We will consult with your cardiologist to ensure it is safe for you to undergo the bronchoscopy.  -GOALS OF CARE: You want to understand the cause of your hemoptysis  and alleviate your concerns. You are committed to pursuing the necessary diagnostic procedures to ensure your health and well-being.  INSTRUCTIONS:  We will schedule a CT scan in [place] as soon as possible. We will also coordinate with your cardiologist to schedule the bronchoscopy and ensure you have transportation for the procedure due to the anesthesia requirements.

## 2023-10-25 NOTE — Telephone Encounter (Signed)
 History of PCI followed by stent thrombosis, requiring repeat procedure and thrombus aspiration in 01/2023. Now. Patient has hypoxia with concerns for hemoptysis. Bronchoscopy evaluation recommended by pulmonology under general anesthesia.   Ideally, she would need at least 1 year of DAPT (till 01/2024). However, given her hypoxia and need for bronchoscopy evaluation, we could consider holding Plavix  for the minimum possible duration (no more than 3-5 days) and continue Aspirin  periprocedural period. Resume Plavix  as soon as possible after bronchoscopy evaluation and no contraindication found.  Fairly stable form, HFrEF standpoint. Moderate perioperative cardiac risk, not prohibitive, Okay to proceed.  Cody Das, MD

## 2023-10-27 ENCOUNTER — Encounter (HOSPITAL_COMMUNITY)
Admission: RE | Admit: 2023-10-27 | Discharge: 2023-10-27 | Disposition: A | Discharge status: Home / Self Care | Source: Ambulatory Visit | Attending: Cardiology | Admitting: Cardiology

## 2023-10-27 DIAGNOSIS — Z952 Presence of prosthetic heart valve: Secondary | ICD-10-CM | POA: Diagnosis not present

## 2023-10-27 DIAGNOSIS — I2102 ST elevation (STEMI) myocardial infarction involving left anterior descending coronary artery: Secondary | ICD-10-CM

## 2023-10-27 DIAGNOSIS — Z955 Presence of coronary angioplasty implant and graft: Secondary | ICD-10-CM

## 2023-10-28 ENCOUNTER — Other Ambulatory Visit: Payer: Self-pay | Admitting: Pulmonary Disease

## 2023-10-28 ENCOUNTER — Ambulatory Visit
Admission: RE | Admit: 2023-10-28 | Discharge: 2023-10-28 | Disposition: A | Discharge status: Home / Self Care | Source: Ambulatory Visit | Attending: Pulmonary Disease | Admitting: Pulmonary Disease

## 2023-10-28 DIAGNOSIS — R042 Hemoptysis: Secondary | ICD-10-CM

## 2023-10-28 DIAGNOSIS — R0609 Other forms of dyspnea: Secondary | ICD-10-CM

## 2023-10-29 ENCOUNTER — Encounter (HOSPITAL_COMMUNITY): Admission: RE | Admit: 2023-10-29 | Source: Ambulatory Visit

## 2023-11-01 ENCOUNTER — Encounter (HOSPITAL_COMMUNITY)
Admission: RE | Admit: 2023-11-01 | Discharge: 2023-11-01 | Disposition: A | Discharge status: Home / Self Care | Source: Ambulatory Visit | Attending: Cardiology | Admitting: Cardiology

## 2023-11-01 ENCOUNTER — Encounter: Payer: Self-pay | Admitting: Pulmonary Disease

## 2023-11-01 ENCOUNTER — Other Ambulatory Visit: Payer: Self-pay | Admitting: Pulmonary Disease

## 2023-11-01 VITALS — Ht 68.0 in | Wt 197.8 lb

## 2023-11-01 DIAGNOSIS — R0609 Other forms of dyspnea: Secondary | ICD-10-CM

## 2023-11-01 DIAGNOSIS — Z952 Presence of prosthetic heart valve: Secondary | ICD-10-CM | POA: Diagnosis not present

## 2023-11-01 DIAGNOSIS — R042 Hemoptysis: Secondary | ICD-10-CM

## 2023-11-01 DIAGNOSIS — Z955 Presence of coronary angioplasty implant and graft: Secondary | ICD-10-CM

## 2023-11-01 DIAGNOSIS — I2102 ST elevation (STEMI) myocardial infarction involving left anterior descending coronary artery: Secondary | ICD-10-CM

## 2023-11-01 NOTE — Progress Notes (Signed)
 Reviewed CT scan dated 10/28/2023 showing burden of groundglass with scattered subpleural thickening, reticulation, air trapping in alternate pattern  I have asked the patient to come in for labs to check for autoimmune disease, hypersensitivity pneumonitis panel.  She is scheduled for bronchoscopy with IV inspection and lavage, possible biopsy on 11/10/2023 at 8:30 AM She knows to hold her Plavix  for 5 days before procedure.  Jenilyn Magana MD [facility] See Amion for pager  If no response to pager , please call [phone] until 7pm After 7:00 pm call Elink  [phone] 11/01/2023, 2:52 PM

## 2023-11-03 ENCOUNTER — Encounter (HOSPITAL_COMMUNITY)
Admission: RE | Admit: 2023-11-03 | Discharge: 2023-11-03 | Disposition: A | Discharge status: Home / Self Care | Source: Ambulatory Visit | Attending: Cardiology | Admitting: Cardiology

## 2023-11-03 ENCOUNTER — Ambulatory Visit: Attending: Cardiology | Admitting: Pharmacist

## 2023-11-03 VITALS — BP 104/76 | HR 77

## 2023-11-03 DIAGNOSIS — I5020 Unspecified systolic (congestive) heart failure: Secondary | ICD-10-CM | POA: Insufficient documentation

## 2023-11-03 DIAGNOSIS — Z952 Presence of prosthetic heart valve: Secondary | ICD-10-CM

## 2023-11-03 DIAGNOSIS — I2102 ST elevation (STEMI) myocardial infarction involving left anterior descending coronary artery: Secondary | ICD-10-CM

## 2023-11-03 DIAGNOSIS — Z955 Presence of coronary angioplasty implant and graft: Secondary | ICD-10-CM

## 2023-11-03 NOTE — Patient Instructions (Addendum)
 Continue losartan  25mg  daily, Farxiga  10mg , spironolactone  12.5 mg daily and furosemide  10mg  as needed for swelling  Please start writing down your blood pressure and heart rate  I will call you in a few weeks to discuss medications

## 2023-11-03 NOTE — Progress Notes (Signed)
 Patient ID: Madison West                 DOB: [birth year]                      MRN: [id number]     HPI: Madison West is a 68 y.o. female referred by Dr. Filiberto Hug to pharmacy clinic for HF medication management. PMH is significant for HTN, CAD s/p carotid endarterectomy (2020), RI stent (2021), PCI > STEMI (Aug 2024), HFrEF, VF arrest 2/2 stent-thrombosis, s/p TAVR for severe AS, DVT (on Eliquis ), breast cancer (s/p lumpectomy and radiation in 2009), HLD. Most recent LVEF 35-40% 07/07/23.   Most recently seen by Dr. Filiberto Hug on 04/15/23. She had a prolonged hospitalization from 01/23/28 to 02/18/23. She underwent stent placement on 8/7 for 95% occluded LAD lesion, then had pulseless VT with ROSC, then recurrent VT with EKG demonstrating STEMI due to stent thrombosis - treated with thrombectomy, IABP placement. Underwent TAVR on 8/15. Hospital course further complicated by PNA, DVT. 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 Reported recent low BP (80s/40s) with lightheadedness. She discontinued metoprolol , Farxiga , Entresto , and spironolactone . She endorses intolerances to these medications - has a history of being hesitant to take medications. Reported she was only drink 1200 cc of water/day. BP in clinic 124/72 (off medication), HR 79. Noted LDL of 146 mg/dL, Lp(a) of 409 on rosuvastatin  20. Referred for titration of HF medications and discussion of additional lipid-lowering medication. At last visit, agreed to resume Entresto  24-26 mg PO BID. At her physical rehab appt on 11/18 she reported muscle weakness, which she feels is related to her statin (aching in her legs, feels she has been run over by a truck). She also called in reporting blood-tinged sputum, O2 to 87% - referred to pulmonology. Gets her medications from [facility].  Seen by me on 05/13/23. She had resumed Entresto  but had some dizziness. Entresto  was stopped and losartan  12.5mg  daily  was started. She was asked to resume Farxiga  10mg  daily. Her rosuvastatin  was decreased to 10mg  daily and she was scheduled to discuss further. Her losartan  was increased to 25mg  daily on 06/03/23. Echo 07/07/23 showed an EF of 35-40% (slightly lower than previous). She was referred back to PharmD clinic. Patient saw Briant Camper on 12/30 and was started on zetia . Patient has fear of needles and [place] wont pay for PCSK9i until pt has tried Zetia .  Patient last seen 07/16/2023.  She was unsure if she was supposed to be taking Farxiga .  She had not been taking it.  She was resumed on Farxiga .  I followed up with her via telephone 07/2023.  She was taking both losartan  and Farxiga .  Reported blood pressure 117/70.  Eliquis  stopped at 09/06/23 visit with Dr. Filiberto Hug since she completed 6 months Provoked DVT.  Recommended she resume aspirin  along with her clopidogrel  until August 2025 then Plavix  monotherapy.  BNP was elevated and patient started on spironolactone  12.5 mg daily.  Patient has historically refused beta-blockers feels as though is the cause of her drop in blood pressure although she was on Entresto  and spironolactone  at the time as well.  Not taking Zetia .  Patient presents today for follow-up.  Confirmed with patient she is taking all her medications as prescribed.  She did stop both her Eliquis  and her amiodarone  as advised.  She has an upcoming bronchoscopy on 5/21.  She is nervous about this,  wishes they would do another echo prior to her procedure.  Concerned about V. tach.  Reports blood pressure at home in the 120 systolic but generally much lower at cardiac rehab.  Spent time reviewing with patient the importance of 4 classes of guideline directed medical therapy.  We talked about how when her blood pressure plummeted she was on Entresto  at the time which was more likely the reason for her low blood pressure and not metoprolol .  She does not want to make any changes prior to her procedure on  May 21.   Hyperlipidemia Current Medications: rosuvastatin  10 mg PO daily Intolerances: atorvastatin  10 mg, rosuvastatin  20mg  daily Risk Factors: progressive CAD, former smoker, age LDL-C goal: <55 mg/dL ApoB goal: <16 mg/dL  Current CHF meds: furosemide  10 mg PO daily PRN, losartan  25mg  daily, Farxiga  10mg , spironolactone  12.5 mg daily Previously tried: spironolactone  12.5, Farxiga  10, metoprolol  (dizziness, low blood pressure)  Adherence Assessment  Do you ever forget to take your medication? [] Yes [x] No  Do you ever skip doses due to side effects? [] Yes [x] No  Do you have trouble affording your medicines? - free at the [place] (mail order) [] Yes [x] No  Are you ever unable to pick up your medication due to transportation difficulties? [] Yes [x] No  Do you ever stop taking your medications because you don't believe they are helping? [x] Yes [] No  Do you check your weight daily? [x] Yes [] No   Adherence strategy: did not discuss today  Barriers to obtaining medications: Prefers to get medications through [facility] mail order because they are free  BP goal: <130/80 mmHg  Family History:  Family History  Problem Relation Age of Onset   Colon cancer Mother 23   Heart disease Father        heart attack   Colon polyps Brother        one brother had large colon polyp that needed surgery to be removed   Heart disease Paternal Grandfather    Diabetes type II Brother     Social History: Works part time as a Leisure centre manager. Former smoker- ~15 pack yr hx. Denies current alcohol use.  Diet: Reports that the people in her house do not follow a heart healthy diet. Last night she had a small serving of fettuccine alfredo for dinner. She does not add salt to her foods.   Exercise: Staying active at work ([employer]). Did not discuss exercise regimen.   Home BP readings: Reports at home blood pressure typically in the 120 systolic sometimes lower.  But generally when she is at the doctor or cardiac  rehab it is in the low 100s systolic. Wt Readings from Last 3 Encounters:  11/01/23 197 lb 12 oz (89.7 kg)  10/25/23 195 lb 12.8 oz (88.8 kg)  09/30/23 195 lb 9.6 oz (88.7 kg)   BP Readings from Last 3 Encounters:  11/03/23 104/76  10/25/23 120/64  09/30/23 110/79   Pulse Readings from Last 3 Encounters:  11/03/23 77  10/25/23 82  09/30/23 79    Renal function: CrCl cannot be calculated (Patient's most recent lab result is older than the maximum 21 days allowed.).  Labs: Lipid Panel     Component Value Date/Time   CHOL 220 (H) 12/30/2022 1604   TRIG 91 02/04/2023 0207   HDL 51 12/30/2022 1604   CHOLHDL 4.2 01/19/2020 1434   CHOLHDL 6.7 12/17/2011 0949   VLDL 48 (H) 12/17/2011 0949   LDLCALC 146 (H) 12/30/2022 1604   LABVLDL 23 12/30/2022 1604  Lp(a) 227.2 mg/dL (06/24/06)  Past Medical History:  Diagnosis Date   Anxiety    Arthritis    low back and hip pain intermittent   Breast cancer (HCC) 06/02/07   r breast -surgery ,radiaology. chemotherapy   Carpal tunnel syndrome    right hand   Colon polyps    Complication of anesthesia    Fentanyl , Versed -makes extra hyper, bradycardia x 1 in PACU, [facility] (08/15/11 cardiology felt neostigmine may have resulted in AV nodal block)    Coronary artery disease    Depression    denies   Dysplasia of vulva    Hypertension    Palpitations    PSVT, s/p adenosine  08/04/16   S/P breast lumpectomy 07/04/07   R breast   S/P radiation therapy 2009    Current Outpatient Medications on File Prior to Visit  Medication Sig Dispense Refill   aspirin  EC 81 MG tablet Take 1 tablet (81 mg total) by mouth daily. Swallow whole.     clopidogrel  (PLAVIX ) 75 MG tablet Take 1 tablet (75 mg total) by mouth daily. 90 tablet 3   dapagliflozin  propanediol (FARXIGA ) 10 MG TABS tablet Take 1 tablet (10 mg total) by mouth daily before breakfast. 90 tablet 3   rosuvastatin  (CRESTOR ) 20 MG tablet Take 0.5 tablets (10 mg total) by mouth daily.      spironolactone  (ALDACTONE ) 25 MG tablet Take 0.5 tablets (12.5 mg total) by mouth daily. 45 tablet 3   acetaminophen  (TYLENOL ) 325 MG tablet Take 2 tablets (650 mg total) by mouth every 6 (six) hours. (Patient taking differently: Take 500 mg by mouth every 6 (six) hours.)     alprazolam  (XANAX ) 2 MG tablet Take 0.25-2 mg by mouth See admin instructions. 1 mg (may take up to 2 mg) twice daily. May also take 0.25 mg during the day if needed for anxiety.     busPIRone  (BUSPAR ) 10 MG tablet TAKE 1 TABLET BY MOUTH TWICE A DAY (Patient taking differently: Take 5 mg by mouth 2 (two) times daily.) 180 tablet 1   ezetimibe  (ZETIA ) 10 MG tablet Take 1 tablet (10 mg total) by mouth daily. (Patient not taking: Reported on 09/08/2023) 90 tablet 3   furosemide  (LASIX ) 20 MG tablet Take 0.5 tablets (10 mg total) by mouth daily as needed (for lower extremity swelling and weight gain). 45 tablet 1   losartan  (COZAAR ) 25 MG tablet Take 1 tablet (25 mg total) by mouth daily. 90 tablet 3   methocarbamol  (ROBAXIN ) 500 MG tablet Take 1 tablet (500 mg total) by mouth 2 (two) times daily. 60 tablet 0   nitroGLYCERIN  (NITROSTAT ) 0.4 MG SL tablet Place 1 tablet (0.4 mg total) under the tongue every 5 (five) minutes as needed for chest pain. 25 tablet 2   oxyCODONE  (ROXICODONE ) 5 MG immediate release tablet Take 1 tablet (5 mg total) by mouth daily as needed for severe pain (pain score 7-10). 30 tablet 0   No current facility-administered medications on file prior to visit.    Allergies  Allergen Reactions   Prednisone Palpitations   Fentanyl  Swelling and Other (See Comments)    Makes pt "hyper" feels she is "climbing the walls."  Last time she had a colonoscopy with Fentanyl  and Versed  she was awake the whole time.   Versed  [Midazolam ] Swelling and Anxiety    Makes pt "hyper" feels she is "climbing the walls."  Last time she had a colonoscopy with Fentanyl  and Versed  she was awake the whole time.  Vicodin  [Hydrocodone -Acetaminophen ] Anxiety    Extreme anxiety.  OK to take oxycodone .     Assessment/Plan:  HFrEF (heart failure with reduced ejection fraction) (HCC) Assessment: Blood pressure well-controlled in clinic today but not leaving much room for medication titration Patient only reports home blood pressures for memory, has not been writing them down She is very scared about taking medications She does report compliance with losartan , Farxiga , spironolactone  Has not needed furosemide -no swelling Nervous about her upcoming bronchoscopy Nervous about her decline in ejection fraction on her last echo We talked about the importance of trying to get her on and off for medications to help improve her heart function She does not want to make any changes prior to the procedure  Plan: I have asked patient to start writing down what her blood pressure and heart rate is Continue losartan  25 mg daily, Farxiga  10 mg daily, spironolactone  12.5 mg daily Check CMP-pulmonary has ordered this lab and she is going to get that collected today along with other labs I will call her in a couple weeks to rediscuss starting beta-blocker    Thank you    Milayah Krell D Josua Ferrebee, Pharm.D, BCACP, BCPS, CPP [facility] Division of [facility] [address], [place], [place] [address]  Phone: [phone]; Fax: [phone]

## 2023-11-03 NOTE — Progress Notes (Signed)
 Can you please order her home O2? She needed 3L on her walk test from Cardiac Rehab.    11/01/23 1300  6 Minute Walk  Phase Discharge  Distance 1298 feet  Distance % Change 10 %  Distance Feet Change 118 ft  Walk Time 6 minutes  # of Rest Breaks 0  MPH 2.5  METS 2.91  RPE 13  Perceived Dyspnea  1  VO2 Peak 10.2  Symptoms Yes (comment)  Comments SaO2 dropped to 88% on 2L @ 4 minutes-, increased to 3L. Pt had mils SOB, RPD = 1.  2/10 bilateral leg pain  Resting HR 81 bpm  Resting BP 120/60  Resting Oxygen Saturation  90 %  Exercise Oxygen Saturation  during 6 min walk 88 %  Max Ex. HR 108 bpm  Max Ex. BP 140/60  Interval Oxygen  Interval Oxygen? Yes  1 Minute Oxygen Saturation % 93 %  1 Minute Liters of Oxygen 2 L  2 Minute Oxygen Saturation % 89 %  2 Minute Liters of Oxygen 2 L  3 Minute Oxygen Saturation % 89 %  3 Minute Liters of Oxygen 2 L  4 Minute Oxygen Saturation % 88 %  4 Minute Liters of Oxygen 2 L (increased to 3L)  5 Minute Oxygen Saturation % 91 %  5 Minute Liters of Oxygen 3 L  6 Minute Oxygen Saturation % 91 %  6 Minute Liters of Oxygen 3 L  2 Minute Post Oxygen Saturation % 93 %  2 Minute Post Liters of Oxygen 2 L

## 2023-11-03 NOTE — Assessment & Plan Note (Signed)
 Assessment: Blood pressure well-controlled in clinic today but not leaving much room for medication titration Patient only reports home blood pressures for memory, has not been writing them down She is very scared about taking medications She does report compliance with losartan , Farxiga , spironolactone  Has not needed furosemide -no swelling Nervous about her upcoming bronchoscopy Nervous about her decline in ejection fraction on her last echo We talked about the importance of trying to get her on and off for medications to help improve her heart function She does not want to make any changes prior to the procedure  Plan: I have asked patient to start writing down what her blood pressure and heart rate is Continue losartan  25 mg daily, Farxiga  10 mg daily, spironolactone  12.5 mg daily Check CMP-pulmonary has ordered this lab and she is going to get that collected today along with other labs I will call her in a couple weeks to rediscuss starting beta-blocker

## 2023-11-04 ENCOUNTER — Telehealth (HOSPITAL_COMMUNITY): Payer: Self-pay

## 2023-11-04 LAB — COMPREHENSIVE METABOLIC PANEL WITH GFR
ALT: 19 U/L (ref 0–35)
AST: 31 U/L (ref 0–37)
Albumin: 4.4 g/dL (ref 3.5–5.2)
Alkaline Phosphatase: 51 U/L (ref 39–117)
BUN: 19 mg/dL (ref 6–23)
CO2: 23 meq/L (ref 19–32)
Calcium: 9.7 mg/dL (ref 8.4–10.5)
Chloride: 99 meq/L (ref 96–112)
Creatinine, Ser: 0.87 mg/dL (ref 0.40–1.20)
GFR: 67.63 mL/min (ref >60.00)
Glucose, Bld: 75 mg/dL (ref 70–99)
Potassium: 4.2 meq/L (ref 3.5–5.1)
Sodium: 132 meq/L — ABNORMAL LOW (ref 135–145)
Total Bilirubin: 0.6 mg/dL (ref 0.2–1.2)
Total Protein: 7.9 g/dL (ref 6.0–8.3)

## 2023-11-04 LAB — CBC WITH DIFFERENTIAL/PLATELET
Basophils Absolute: 0.1 10*3/uL (ref 0.0–0.1)
Basophils Relative: 1.2 % (ref 0.0–3.0)
Eosinophils Absolute: 0.3 10*3/uL (ref 0.0–0.7)
Eosinophils Relative: 5.4 % — ABNORMAL HIGH (ref 0.0–5.0)
HCT: 37.7 % (ref 36.0–46.0)
Hemoglobin: 12.2 g/dL (ref 12.0–15.0)
Lymphocytes Relative: 19.6 % (ref 12.0–46.0)
Lymphs Abs: 1.1 10*3/uL (ref 0.7–4.0)
MCHC: 32.5 g/dL (ref 30.0–36.0)
MCV: 78.6 fl (ref 78.0–100.0)
Monocytes Absolute: 0.4 10*3/uL (ref 0.1–1.0)
Monocytes Relative: 7.5 % (ref 3.0–12.0)
Neutro Abs: 3.8 10*3/uL (ref 1.4–7.7)
Neutrophils Relative %: 66.3 % (ref 43.0–77.0)
Platelets: 386 10*3/uL (ref 150.0–400.0)
RBC: 4.79 Mil/uL (ref 3.87–5.11)
RDW: 16 % — ABNORMAL HIGH (ref 11.5–15.5)
WBC: 5.8 10*3/uL (ref 4.0–10.5)

## 2023-11-04 LAB — PROTIME-INR
INR: 1 ratio (ref 0.8–1.0)
Prothrombin Time: 10.8 s (ref 9.6–13.1)

## 2023-11-04 NOTE — Telephone Encounter (Signed)
 Patient called requesting change to 1:45pm class on Friday due to work schedule.

## 2023-11-05 ENCOUNTER — Encounter (HOSPITAL_COMMUNITY)

## 2023-11-05 ENCOUNTER — Encounter (HOSPITAL_COMMUNITY)
Admission: RE | Admit: 2023-11-05 | Discharge: 2023-11-05 | Disposition: A | Discharge status: Home / Self Care | Source: Ambulatory Visit | Attending: Cardiology | Admitting: Cardiology

## 2023-11-05 DIAGNOSIS — I2102 ST elevation (STEMI) myocardial infarction involving left anterior descending coronary artery: Secondary | ICD-10-CM

## 2023-11-05 DIAGNOSIS — Z955 Presence of coronary angioplasty implant and graft: Secondary | ICD-10-CM

## 2023-11-05 DIAGNOSIS — Z952 Presence of prosthetic heart valve: Secondary | ICD-10-CM | POA: Diagnosis not present

## 2023-11-08 ENCOUNTER — Encounter (HOSPITAL_COMMUNITY)
Admission: RE | Admit: 2023-11-08 | Discharge: 2023-11-08 | Disposition: A | Discharge status: Home / Self Care | Source: Ambulatory Visit | Attending: Cardiology | Admitting: Cardiology

## 2023-11-08 ENCOUNTER — Encounter: Payer: Self-pay | Admitting: Pulmonary Disease

## 2023-11-08 ENCOUNTER — Telehealth: Payer: Self-pay

## 2023-11-08 DIAGNOSIS — Z952 Presence of prosthetic heart valve: Secondary | ICD-10-CM | POA: Diagnosis not present

## 2023-11-08 DIAGNOSIS — Z955 Presence of coronary angioplasty implant and graft: Secondary | ICD-10-CM

## 2023-11-08 DIAGNOSIS — R042 Hemoptysis: Secondary | ICD-10-CM | POA: Insufficient documentation

## 2023-11-08 DIAGNOSIS — I2102 ST elevation (STEMI) myocardial infarction involving left anterior descending coronary artery: Secondary | ICD-10-CM

## 2023-11-08 LAB — ANA,IFA RA DIAG PNL W/RFLX TIT/PATN
Anti Nuclear Antibody (ANA): POSITIVE — AB
Cyclic Citrullin Peptide Ab: <16 U
Rheumatoid fact SerPl-aCnc: 11 [IU]/mL (ref <14)

## 2023-11-08 LAB — ANCA SCREEN W REFLEX TITER: ANCA SCREEN: NEGATIVE

## 2023-11-08 LAB — SJOGRENS SYNDROME-A EXTRACTABLE NUCLEAR ANTIBODY: SSA (Ro) (ENA) Antibody, IgG: <1 AI

## 2023-11-08 LAB — SJOGRENS SYNDROME-B EXTRACTABLE NUCLEAR ANTIBODY: SSB (La) (ENA) Antibody, IgG: <1 AI

## 2023-11-08 LAB — ANTI-NUCLEAR AB-TITER (ANA TITER)
ANA TITER: 1:40 {titer} — ABNORMAL HIGH
ANA Titer 1: 1:40 {titer} — ABNORMAL HIGH

## 2023-11-08 LAB — ANTI-SCLERODERMA ANTIBODY: Scleroderma (Scl-70) (ENA) Antibody, IgG: <1 AI

## 2023-11-08 NOTE — Telephone Encounter (Signed)
 Copied from CRM [id number]. Topic: Clinical - Lab/Test Results >> Nov 08, 2023  4:02 PM Margarette Shawl wrote: Reason for CRM:   Pt is calling regarding results from labwork on 11/03/2023. She is very worried with the results, with numerous values being abnormal. Would like a call back as soon as possible, so that a nurse can review the results with her.  CB# [phone]   Please advise Dr. Waylan Haggard pt is concerned about labwork results.

## 2023-11-09 ENCOUNTER — Telehealth: Payer: Self-pay | Admitting: Cardiology

## 2023-11-09 ENCOUNTER — Telehealth: Payer: Self-pay | Admitting: *Deleted

## 2023-11-09 ENCOUNTER — Encounter (HOSPITAL_COMMUNITY): Payer: Self-pay | Admitting: Pulmonary Disease

## 2023-11-09 ENCOUNTER — Other Ambulatory Visit: Payer: Self-pay

## 2023-11-09 NOTE — Anesthesia Preprocedure Evaluation (Addendum)
 Anesthesia Evaluation  Patient identified by MRN, date of birth, ID band Patient awake    Reviewed: Allergy & Precautions, NPO status , Patient's Chart, lab work & pertinent test results  History of Anesthesia Complications Negative for: history of anesthetic complications  Airway Mallampati: II  TM Distance: >3 FB Neck ROM: Full    Dental  (+) Dental Advisory Given, Chipped   Pulmonary former smoker   Pulmonary exam normal        Cardiovascular hypertension, Pt. on medications + CAD, + Past MI, + Cardiac Stents, + Peripheral Vascular Disease, +CHF and + DVT  Normal cardiovascular exam+ dysrhythmias Supra Ventricular Tachycardia + Valvular Problems/Murmurs    '25 TTE - EF 35 to 40%. Trivial MR. Well-seated bioprosthetic AV with normal function.     Neuro/Psych  PSYCHIATRIC DISORDERS Anxiety Depression    negative neurological ROS     GI/Hepatic negative GI ROS, Neg liver ROS,,,  Endo/Other   Na 132   Renal/GU negative Renal ROS     Musculoskeletal  (+) Arthritis ,    Abdominal   Peds  Hematology  On plavix     Anesthesia Other Findings   Reproductive/Obstetrics                             Anesthesia Physical Anesthesia Plan  ASA: 4  Anesthesia Plan: General   Post-op Pain Management: Minimal or no pain anticipated and Tylenol  PO (pre-op)*   Induction: Intravenous  PONV Risk Score and Plan: 3 and Treatment may vary due to age or medical condition, Ondansetron  and Dexamethasone   Airway Management Planned: Oral ETT  Additional Equipment: None  Intra-op Plan:   Post-operative Plan: Extubation in OR  Informed Consent: I have reviewed the patients History and Physical, chart, labs and discussed the procedure including the risks, benefits and alternatives for the proposed anesthesia with the patient or authorized representative who has indicated his/her understanding and  acceptance.     Dental advisory given  Plan Discussed with: CRNA and Anesthesiologist  Anesthesia Plan Comments: (See PAT note written 11/09/2023 by Allison Zelenak, PA-C.  )       Anesthesia Quick Evaluation

## 2023-11-09 NOTE — Progress Notes (Signed)
 Cardiac Individual Treatment Plan  Patient Details  Name: Madison West MRN: [id number] Date of Birth: 05/14/1956 Referring Provider:   Flowsheet Row INTENSIVE CARDIAC REHAB ORIENT from 07/26/2023 in [facility]  Referring Provider Fransico Ivy, MD       Initial Encounter Date:  Flowsheet Row INTENSIVE CARDIAC REHAB ORIENT from 07/26/2023 in [facility]  Date 07/26/23       Visit Diagnosis: 02/04/23 S/P TAVR (transcatheter aortic valve replacement)  02/01/23 STEMI  01/29/23 DES LAD  Patient's Home Medications on Admission:  Current Outpatient Medications:    acetaminophen  (TYLENOL ) 325 MG tablet, Take 2 tablets (650 mg total) by mouth every 6 (six) hours. (Patient taking differently: Take 500 mg by mouth every 6 (six) hours.), Disp: , Rfl:    alprazolam  (XANAX ) 2 MG tablet, Take 0.25-2 mg by mouth See admin instructions. 1 mg (may take up to 2 mg) twice daily. May also take 0.25 mg during the day if needed for anxiety., Disp: , Rfl:    aspirin  EC 81 MG tablet, Take 1 tablet (81 mg total) by mouth daily. Swallow whole., Disp: , Rfl:    busPIRone  (BUSPAR ) 10 MG tablet, TAKE 1 TABLET BY MOUTH TWICE A DAY (Patient taking differently: Take 5 mg by mouth 2 (two) times daily.), Disp: 180 tablet, Rfl: 1   clopidogrel  (PLAVIX ) 75 MG tablet, Take 1 tablet (75 mg total) by mouth daily., Disp: 90 tablet, Rfl: 3   dapagliflozin  propanediol (FARXIGA ) 10 MG TABS tablet, Take 1 tablet (10 mg total) by mouth daily before breakfast., Disp: 90 tablet, Rfl: 3   ezetimibe  (ZETIA ) 10 MG tablet, Take 1 tablet (10 mg total) by mouth daily. (Patient not taking: Reported on 09/08/2023), Disp: 90 tablet, Rfl: 3   furosemide  (LASIX ) 20 MG tablet, Take 0.5 tablets (10 mg total) by mouth daily as needed (for lower extremity swelling and weight gain)., Disp: 45 tablet, Rfl: 1   losartan   (COZAAR ) 25 MG tablet, Take 1 tablet (25 mg total) by mouth daily., Disp: 90 tablet, Rfl: 3   methocarbamol  (ROBAXIN ) 500 MG tablet, Take 1 tablet (500 mg total) by mouth 2 (two) times daily., Disp: 60 tablet, Rfl: 0   nitroGLYCERIN  (NITROSTAT ) 0.4 MG SL tablet, Place 1 tablet (0.4 mg total) under the tongue every 5 (five) minutes as needed for chest pain., Disp: 25 tablet, Rfl: 2   oxyCODONE  (ROXICODONE ) 5 MG immediate release tablet, Take 1 tablet (5 mg total) by mouth daily as needed for severe pain (pain score 7-10)., Disp: 30 tablet, Rfl: 0   rosuvastatin  (CRESTOR ) 20 MG tablet, Take 0.5 tablets (10 mg total) by mouth daily., Disp: , Rfl:    spironolactone  (ALDACTONE ) 25 MG tablet, Take 0.5 tablets (12.5 mg total) by mouth daily., Disp: 45 tablet, Rfl: 3  Past Medical History: Past Medical History:  Diagnosis Date   Anxiety    Aortic stenosis    s/p TAVR 02/04/23   Arthritis    low back and hip pain intermittent   Breast cancer (HCC) 06/02/2007   r breast -surgery ,radiaology. chemotherapy   Carotid artery disease (HCC)    Carpal tunnel syndrome    right hand   CHF (congestive heart failure) (HCC)    Colon polyps    Complication of anesthesia    Fentanyl , Versed -makes extra hyper, bradycardia x 1 in PACU, [facility] (08/15/11 cardiology felt neostigmine may have resulted  in AV nodal block)    Coronary artery disease    Depression    denies   DVT (deep venous thrombosis) (HCC)    provoked left IJ 02/10/23   Dysplasia of vulva    Hypertension    Palpitations    PSVT, s/p adenosine  08/04/16   S/P breast lumpectomy 07/04/2007   R breast   S/P radiation therapy 2009   Vulva cancer (HCC)     Tobacco Use: Social History   Tobacco Use  Smoking Status Former   Current packs/day: 0.00   Average packs/day: 0.8 packs/day for 20.0 years (15.0 ttl pk-yrs)   Types: Cigarettes   Start date: 05/06/1977   Quit date: 05/06/1997   Years since quitting: 26.5  Smokeless Tobacco Never     Labs: Review Flowsheet  More data exists      Latest Ref Rng & Units 02/12/2023 02/13/2023 02/14/2023 06/21/2023 09/02/2023  Labs for ITP Cardiac and Pulmonary Rehab  Cholestrol 100 - 199 mg/dL - - - 604  540   LDL (calc) 0 - 99 mg/dL - - - 94  71   HDL-C >98 mg/dL - - - 67  54   Trlycerides 0 - 149 mg/dL - - - 119  99   O2 Saturation % 76.1  68.3  60.4  - -    Capillary Blood Glucose: Lab Results  Component Value Date   GLUCAP 87 02/19/2023   GLUCAP 103 (H) 02/19/2023   GLUCAP 121 (H) 02/18/2023   GLUCAP 111 (H) 02/18/2023   GLUCAP 110 (H) 02/17/2023     Exercise Target Goals: Exercise Program Goal: Individual exercise prescription set using results from initial 6 min walk test and THRR while considering  patient's activity barriers and safety.   Exercise Prescription Goal: Initial exercise prescription builds to 30-45 minutes a day of aerobic activity, 2-3 days per week.  Home exercise guidelines will be given to patient during program as part of exercise prescription that the participant will acknowledge.  Activity Barriers & Risk Stratification:  Activity Barriers & Cardiac Risk Stratification - 07/26/23 1419       Activity Barriers & Cardiac Risk Stratification   Activity Barriers Deconditioning;Back Problems;Neck/Spine Problems;Balance Concerns;Decreased Ventricular Function    Cardiac Risk Stratification High   <5 METs on            6 Minute Walk:  6 Minute Walk     Row Name 07/26/23 1619 11/01/23 1300       6 Minute Walk   Phase Initial Discharge    Distance 1180 feet 1298 feet    Distance % Change -- 10 %    Distance Feet Change -- 118 ft    Walk Time 6 minutes 6 minutes    # of Rest Breaks 1  2:20-3:20 3/10 hip pain 0    MPH 2.23 2.5    METS 2.6 2.91    RPE 10 13    Perceived Dyspnea  1 1    VO2 Peak 9.11 10.2    Symptoms Yes (comment) Yes (comment)    Comments Pt SpO2 down to 84%, recovered to 94% in around a minute and maintained. RPD  = 1, resolved with rest. 6/10 Hip pain at end of SaO2 dropped to 88% on 2L @ 4 minutes-, increased to 3L. Pt had mils SOB, RPD = 1.  2/10 bilateral leg pain    Resting HR 68 bpm 81 bpm    Resting BP 112/60 120/60  Resting Oxygen Saturation  92 % 90 %    Exercise Oxygen Saturation  during 6 min walk 84 % 88 %    Max Ex. HR 100 bpm 108 bpm    Max Ex. BP 136/66 140/60    2 Minute Post BP 120/62 --      Interval Oxygen   Interval Oxygen? -- Yes    1 Minute Oxygen Saturation % -- 93 %    1 Minute Liters of Oxygen -- 2 L    2 Minute Oxygen Saturation % -- 89 %    2 Minute Liters of Oxygen -- 2 L    3 Minute Oxygen Saturation % -- 89 %    3 Minute Liters of Oxygen -- 2 L    4 Minute Oxygen Saturation % -- 88 %    4 Minute Liters of Oxygen -- 2 L  increased to 3L    5 Minute Oxygen Saturation % -- 91 %    5 Minute Liters of Oxygen -- 3 L    6 Minute Oxygen Saturation % -- 91 %    6 Minute Liters of Oxygen -- 3 L    2 Minute Post Oxygen Saturation % -- 93 %    2 Minute Post Liters of Oxygen -- 2 L             Oxygen Initial Assessment:   Oxygen Re-Evaluation:   Oxygen Discharge (Final Oxygen Re-Evaluation):   Initial Exercise Prescription:  Initial Exercise Prescription - 07/26/23 1600       Date of Initial Exercise RX and Referring Provider   Date 07/26/23    Referring Provider Fransico Ivy, MD    Expected Discharge Date 10/20/23      Recumbant Bike   Level 1    RPM 50    Watts 30    Minutes 15    METs 2      NuStep   Level 1    SPM 60    Minutes 15    METs 2.5      Prescription Details   Frequency (times per week) 3    Duration Progress to 30 minutes of continuous aerobic without signs/symptoms of physical distress      Intensity   THRR 40-80% of Max Heartrate 60-121    Ratings of Perceived Exertion 11-13    Perceived Dyspnea 0-4      Progression   Progression Continue progressive overload as per policy without signs/symptoms or physical  distress.      Resistance Training   Training Prescription Yes    Weight 2    Reps 10-15             Perform Capillary Blood Glucose checks as needed.  Exercise Prescription Changes:   Exercise Prescription Changes     Row Name 07/28/23 1400 08/18/23 1500 09/13/23 1500 09/22/23 1400 09/27/23 1500     Response to Exercise   Blood Pressure (Admit) 128/68 122/70 106/70 112/70 108/62   Blood Pressure (Exercise) 138/78 144/70 -- 114/64 120/70   Blood Pressure (Exit) 112/70 100/70 94/60 94/60  91/52   Heart Rate (Admit) 75 bpm 82 bpm 73 bpm 74 bpm 76 bpm   Heart Rate (Exercise) 102 bpm 107 bpm 89 bpm 91 bpm 90 bpm   Heart Rate (Exit) 81 bpm 81 bpm 78 bpm 76 bpm 82 bpm   Oxygen Saturation (Admit) 87 % -- 91 % 94 % 76 %   Oxygen Saturation (Exercise) 89 % 91 %  2 L O2 via n/c 92 % 93 % 90 %   Oxygen Saturation (Exit) 94 % 93 % 90 % 92 % 82 %   Rating of Perceived Exertion (Exercise) 11 13 11 12 11    Perceived Dyspnea (Exercise) 0.5 1 1 1 1    Symptoms SOB SOB SOB, RPD = 1 SOB, RPD = 1 SOB, RPD = 1, lightheaded after exercise   Comments Pt's first day in the CRP2 program Reviewed METs Reviewed METs and goals Reviewed home exercise Rx Reviewed METs   Duration Continue with 30 min of aerobic exercise without signs/symptoms of physical distress. Continue with 30 min of aerobic exercise without signs/symptoms of physical distress. Continue with 30 min of aerobic exercise without signs/symptoms of physical distress. Continue with 30 min of aerobic exercise without signs/symptoms of physical distress. Continue with 30 min of aerobic exercise without signs/symptoms of physical distress.   Intensity THRR unchanged THRR unchanged THRR unchanged THRR unchanged THRR unchanged     Progression   Progression Continue to progress workloads to maintain intensity without signs/symptoms of physical distress. Continue to progress workloads to maintain intensity without signs/symptoms of physical distress.  Continue to progress workloads to maintain intensity without signs/symptoms of physical distress. Continue to progress workloads to maintain intensity without signs/symptoms of physical distress. Continue to progress workloads to maintain intensity without signs/symptoms of physical distress.   Average METs 2.15 2.4 2.4 2.6 2.7     Resistance Training   Training Prescription No No Yes No Yes   Weight No weights on wednesdays No weights on wednesdays 3 lb No weights on wednesdays 2 lbs   Reps -- -- 10-15 -- 10-15   Time -- -- 10 Minutes -- 10 Minutes     Interval Training   Interval Training No No No No No     Recumbant Bike   Level 1 1 1 2 2    RPM 62 70 77 69 66   Watts 16 16 21 22 18    Minutes 15 15 15 15 15    METs 2 2 2.2 2.5 2.2     NuStep   Level 1 1 1 2 2    SPM 84 95 85 91 100   Minutes 15 15 15 15 15    METs 2.3 2.8 2.6 2.7 3.2     Home Exercise Plan   Plans to continue exercise at -- -- -- Home (comment) Home (comment)   Frequency -- -- -- Add 2 additional days to program exercise sessions. Add 2 additional days to program exercise sessions.   Initial Home Exercises Provided -- -- -- 09/22/23 09/22/23    Row Name 10/11/23 1600 10/25/23 1700           Response to Exercise   Blood Pressure (Admit) 100/56 110/78      Blood Pressure (Exit) 120/54 110/60      Heart Rate (Admit) 86 bpm 75 bpm      Heart Rate (Exercise) 103 bpm 92 bpm      Heart Rate (Exit) 86 bpm 84 bpm      Oxygen Saturation (Admit) 91 % 92 %      Oxygen Saturation (Exercise) 90 % 90 %      Oxygen Saturation (Exit) 89 % 91 %      Rating of Perceived Exertion (Exercise) 13 11      Perceived Dyspnea (Exercise) 1 1      Symptoms SOB, RPD = 1, lightheaded after exercise SOB, RPD = 1, lightheaded after  exercise      Comments Reviewed METs and goals Reviewed METs      Duration Continue with 30 min of aerobic exercise without signs/symptoms of physical distress. Continue with 30 min of aerobic exercise  without signs/symptoms of physical distress.      Intensity THRR unchanged THRR unchanged        Progression   Progression Continue to progress workloads to maintain intensity without signs/symptoms of physical distress. Continue to progress workloads to maintain intensity without signs/symptoms of physical distress.      Average METs 2.75 2.6        Resistance Training   Training Prescription Yes Yes      Weight 2 lbs 2 lbs      Reps 10-15 10-15      Time 10 Minutes 5 Minutes        Interval Training   Interval Training No No        Recumbant Bike   Level 2 2      RPM 79 19      Watts -- 61      Minutes 15 15      METs 2.2 2.1        NuStep   Level 2 2      SPM 104 99      Minutes 15 15      METs 3.3 3        Home Exercise Plan   Plans to continue exercise at Home (comment) Home (comment)      Frequency Add 2 additional days to program exercise sessions. Add 2 additional days to program exercise sessions.      Initial Home Exercises Provided 09/22/23 09/22/23               Exercise Comments:   Exercise Comments     Row Name 07/28/23 1415 08/18/23 1400 09/13/23 1500 09/22/23 1431 09/27/23 1500   Exercise Comments Pt's first day in the CRP2 program. Pt did have some mild SOB with exercise (RPD = .5). Will continue to montior and progress as tolerated. Reviwed METs. Pt is making slow progress. Order for O2 with exercise was obtained due to patient's drop in SaO2 during exercise. Reviewed METs and goals. Slow progress. Will encourage increase in exercise workloads. Reviewed home exercise Rx. Pt would like to do pool exercise but pools are not open yet. Pt given some links for chair exercise that she can do at home. Pt verbalized understanding of the HERx and was provided a copy. Reviewed METs. Slow progression. Pt lightheaded after exercise. Had not eaten much today. H2) encouraged, given graham crarkers and PNB. Felt fine on D/C.    Row Name 10/25/23 1706            Exercise Comments Reviewed METs. Pt feeling good about her appt with the pulmonologist today. Slow progression.                Exercise Goals and Review:   Exercise Goals     Row Name 07/26/23 1623             Exercise Goals   Increase Physical Activity Yes       Intervention Provide advice, education, support and counseling about physical activity/exercise needs.;Develop an individualized exercise prescription for aerobic and resistive training based on initial evaluation findings, risk stratification, comorbidities and participant's personal goals.       Expected Outcomes Short Term: Attend rehab on a regular basis to increase amount  of physical activity.;Long Term: Exercising regularly at least 3-5 days a week.;Long Term: Add in home exercise to make exercise part of routine and to increase amount of physical activity.       Increase Strength and Stamina Yes       Intervention Develop an individualized exercise prescription for aerobic and resistive training based on initial evaluation findings, risk stratification, comorbidities and participant's personal goals.;Provide advice, education, support and counseling about physical activity/exercise needs.       Expected Outcomes Short Term: Increase workloads from initial exercise prescription for resistance, speed, and METs.;Short Term: Perform resistance training exercises routinely during rehab and add in resistance training at home;Long Term: Improve cardiorespiratory fitness, muscular endurance and strength as measured by increased METs and functional capacity ( )       Able to understand and use rate of perceived exertion (RPE) scale Yes       Intervention Provide education and explanation on how to use RPE scale       Expected Outcomes Short Term: Able to use RPE daily in rehab to express subjective intensity level;Long Term:  Able to use RPE to guide intensity level when exercising independently       Knowledge and understanding  of Target Heart Rate Range (THRR) Yes       Intervention Provide education and explanation of THRR including how the numbers were predicted and where they are located for reference       Expected Outcomes Short Term: Able to state/look up THRR;Short Term: Able to use daily as guideline for intensity in rehab;Long Term: Able to use THRR to govern intensity when exercising independently       Understanding of Exercise Prescription Yes       Intervention Provide education, explanation, and written materials on patient's individual exercise prescription       Expected Outcomes Short Term: Able to explain program exercise prescription;Long Term: Able to explain home exercise prescription to exercise independently                Exercise Goals Re-Evaluation :  Exercise Goals Re-Evaluation     Row Name 07/28/23 1414 09/13/23 1500           Exercise Goal Re-Evaluation   Exercise Goals Review Increase Physical Activity;Increase Strength and Stamina;Able to understand and use rate of perceived exertion (RPE) scale;Knowledge and understanding of Target Heart Rate Range (THRR);Understanding of Exercise Prescription Increase Physical Activity;Increase Strength and Stamina;Able to understand and use rate of perceived exertion (RPE) scale;Knowledge and understanding of Target Heart Rate Range (THRR);Understanding of Exercise Prescription      Comments Pt's first day in the CRP2 program. Pt understands the exercise RX, RPE sclae and THRR. Reviewed METs and goals. Pt voices progress on her goals of increased strength, stamina and energy. Pt has goal of weight loss and has lost 5.6 kg to date.      Expected Outcomes Will continue to monitor patient and progress exercise workloads as tolerated. Will continue to monitor patient and progress exercise workloads as tolerated.               Discharge Exercise Prescription (Final Exercise Prescription Changes):  Exercise Prescription Changes - 10/25/23 1700        Response to Exercise   Blood Pressure (Admit) 110/78    Blood Pressure (Exit) 110/60    Heart Rate (Admit) 75 bpm    Heart Rate (Exercise) 92 bpm    Heart Rate (Exit) 84 bpm  Oxygen Saturation (Admit) 92 %    Oxygen Saturation (Exercise) 90 %    Oxygen Saturation (Exit) 91 %    Rating of Perceived Exertion (Exercise) 11    Perceived Dyspnea (Exercise) 1    Symptoms SOB, RPD = 1, lightheaded after exercise    Comments Reviewed METs    Duration Continue with 30 min of aerobic exercise without signs/symptoms of physical distress.    Intensity THRR unchanged      Progression   Progression Continue to progress workloads to maintain intensity without signs/symptoms of physical distress.    Average METs 2.6      Resistance Training   Training Prescription Yes    Weight 2 lbs    Reps 10-15    Time 5 Minutes      Interval Training   Interval Training No      Recumbant Bike   Level 2    RPM 19    Watts 61    Minutes 15    METs 2.1      NuStep   Level 2    SPM 99    Minutes 15    METs 3      Home Exercise Plan   Plans to continue exercise at Home (comment)    Frequency Add 2 additional days to program exercise sessions.    Initial Home Exercises Provided 09/22/23             Nutrition:  Target Goals: Understanding of nutrition guidelines, daily intake of sodium 1500mg , cholesterol 200mg , calories 30% from fat and 7% or less from saturated fats, daily to have 5 or more servings of fruits and vegetables.  Biometrics:  Pre Biometrics - 07/26/23 1416       Pre Biometrics   Waist Circumference 48 inches    Hip Circumference 51 inches    Waist to Hip Ratio 0.94 %    Triceps Skinfold 16 mm    % Body Fat 41.8 %    Grip Strength 20 kg    Flexibility 13.5 in    Single Leg Stand 11.25 seconds             Post Biometrics - 11/01/23 1704        Post  Biometrics   Height 5\' 8"  (1.727 m)    Weight 89.7 kg    Waist Circumference 47 inches    Hip  Circumference 51 inches    Waist to Hip Ratio 0.92 %    BMI (Calculated) 30.08    Triceps Skinfold 14 mm    % Body Fat 40.4 %    Grip Strength 20 kg    Flexibility 22 in    Single Leg Stand 18.57 seconds             Nutrition Therapy Plan and Nutrition Goals:  Nutrition Therapy & Goals - 10/27/23 0929       Nutrition Therapy   Diet Heart Healthy Diet    Drug/Food Interactions Statins/Certain Fruits      Personal Nutrition Goals   Nutrition Goal Patient to identify strategies for reducing cardiovascular risk by attending the [employer] education and nutrition series weekly.   goal in progress.   Personal Goal #2 Patient to improve diet quality by using the plate method as a guide for meal planning to include lean protein/plant protein, fruits, vegetables, whole grains, nonfat dairy as part of a well-balanced diet.   goal in progress.   Personal Goal #3 Patient to identify strategies for  weight loss of 0.5-2.0# per week.   goal in progress.   Comments Goals in progress. [name] has medical history of hyperlipidemia, s/p TAVR, STEMI, CHF, HTN, CAD, carotid artery stenosis. She continues to attend  the [facility] education and nutrition series regularly. She is motivated to lose weight to 170#; she is down 6.6# since starting with our program. LDL has improved to 71; she continues zetia , crestor  and heart healthy diet modifications. She continues follow-up with pulmonolgy regarding hemoptysis. Patient will benefit from participation in intensive cardiac rehab for nutrition, exercise, and lifestyle modification.      Intervention Plan   Intervention Prescribe, educate and counsel regarding individualized specific dietary modifications aiming towards targeted core components such as weight, hypertension, lipid management, diabetes, heart failure and other comorbidities.;Nutrition handout(s) given to patient.    Expected Outcomes Short Term Goal: Understand basic principles of dietary content, such  as calories, fat, sodium, cholesterol and nutrients.;Long Term Goal: Adherence to prescribed nutrition plan.             Nutrition Assessments:  MEDIFICTS Score Key: >=70 Need to make dietary changes  40-70 Heart Healthy Diet <= 40 Therapeutic Level Cholesterol Diet    Picture Your Plate Scores: <81 Unhealthy dietary pattern with much room for improvement. 41-50 Dietary pattern unlikely to meet recommendations for good health and room for improvement. 51-60 More healthful dietary pattern, with some room for improvement.  >60 Healthy dietary pattern, although there may be some specific behaviors that could be improved.    Nutrition Goals Re-Evaluation:  Nutrition Goals Re-Evaluation     Row Name 08/24/23 1018 09/24/23 1040 10/27/23 0929         Goals   Current Weight 202 lb 6.1 oz (91.8 kg) 199 lb 1.2 oz (90.3 kg) 196 lb 3.4 oz (89 kg)     Comment LDL 94, Lpa 227 LDL 71, HDL WNL, Lpa 227 no new labs; most recent labs  LDL 71, HDL WNL, Lpa 227     Expected Outcome Goals in progress. [name] has medical history of hyperlipidemia, s/p TAVR, STEMI, CHF, HTN, CAD, carotid artery stenosis. She continues to attend the [employer] education and nutrition series regularly. She is motivated to lose weight to 170#; however, she has not lost weight since starting with our program. LDL remains above goal; she continues zetia , crestor  and heart healthy diet modifications. Patient will benefit from participation in intensive cardiac rehab for nutrition, exercise, and lifestyle modification. Goals in progress. [name] has medical history of hyperlipidemia, s/p TAVR, STEMI, CHF, HTN, CAD, carotid artery stenosis. She continues to attend the [employer] education and nutrition series regularly. She is motivated to lose weight to 170#; she is down 3.7# since starting with our program. LDL has improved to 71; she continues zetia , crestor  and heart healthy diet modifications. Patient will benefit from participation in  intensive cardiac rehab for nutrition, exercise, and lifestyle modification.90.3kg Goals in progress. [name] has medical history of hyperlipidemia, s/p TAVR, STEMI, CHF, HTN, CAD, carotid artery stenosis. She continues to attend the [employer] education and nutrition series regularly. She is motivated to lose weight to 170#; she is down 6.6# since starting with our program. LDL has improved to 71; she continues zetia , crestor  and heart healthy diet modifications. She continues follow-up with pulmonolgy regarding hemoptysis. Patient will benefit from participation in intensive cardiac rehab for nutrition, exercise, and lifestyle modification.              Nutrition Goals Re-Evaluation:  Nutrition Goals Re-Evaluation  Row Name 08/24/23 1018 09/24/23 1040 10/27/23 0929         Goals   Current Weight 202 lb 6.1 oz (91.8 kg) 199 lb 1.2 oz (90.3 kg) 196 lb 3.4 oz (89 kg)     Comment LDL 94, Lpa 227 LDL 71, HDL WNL, Lpa 227 no new labs; most recent labs  LDL 71, HDL WNL, Lpa 227     Expected Outcome Goals in progress. [name] has medical history of hyperlipidemia, s/p TAVR, STEMI, CHF, HTN, CAD, carotid artery stenosis. She continues to attend the [facility] education and nutrition series regularly. She is motivated to lose weight to 170#; however, she has not lost weight since starting with our program. LDL remains above goal; she continues zetia , crestor  and heart healthy diet modifications. Patient will benefit from participation in intensive cardiac rehab for nutrition, exercise, and lifestyle modification. Goals in progress. [name] has medical history of hyperlipidemia, s/p TAVR, STEMI, CHF, HTN, CAD, carotid artery stenosis. She continues to attend the [facility] education and nutrition series regularly. She is motivated to lose weight to 170#; she is down 3.7# since starting with our program. LDL has improved to 71; she continues zetia , crestor  and heart healthy diet modifications. Patient will benefit from  participation in intensive cardiac rehab for nutrition, exercise, and lifestyle modification.90.3kg Goals in progress. [name] has medical history of hyperlipidemia, s/p TAVR, STEMI, CHF, HTN, CAD, carotid artery stenosis. She continues to attend the [facility] education and nutrition series regularly. She is motivated to lose weight to 170#; she is down 6.6# since starting with our program. LDL has improved to 71; she continues zetia , crestor  and heart healthy diet modifications. She continues follow-up with pulmonolgy regarding hemoptysis. Patient will benefit from participation in intensive cardiac rehab for nutrition, exercise, and lifestyle modification.              Nutrition Goals Discharge (Final Nutrition Goals Re-Evaluation):  Nutrition Goals Re-Evaluation - 10/27/23 0929       Goals   Current Weight 196 lb 3.4 oz (89 kg)    Comment no new labs; most recent labs  LDL 71, HDL WNL, Lpa 227    Expected Outcome Goals in progress. [name] has medical history of hyperlipidemia, s/p TAVR, STEMI, CHF, HTN, CAD, carotid artery stenosis. She continues to attend the [facility] education and nutrition series regularly. She is motivated to lose weight to 170#; she is down 6.6# since starting with our program. LDL has improved to 71; she continues zetia , crestor  and heart healthy diet modifications. She continues follow-up with pulmonolgy regarding hemoptysis. Patient will benefit from participation in intensive cardiac rehab for nutrition, exercise, and lifestyle modification.             Psychosocial: Target Goals: Acknowledge presence or absence of significant depression and/or stress, maximize coping skills, provide positive support system. Participant is able to verbalize types and ability to use techniques and skills needed for reducing stress and depression.  Initial Review & Psychosocial Screening:  Initial Psych Review & Screening - 07/26/23 1420       Initial Review   Current issues with  Current Anxiety/Panic;Current Stress Concerns    Source of Stress Concerns Chronic Illness;Family    Comments [name] shared that she has high levels of anxiety and stress due to the amount of tasks she has to do throughout her day. She feels the xanax  is helping and denies any need for additional resources at this time. She has been very fatigued since her event. Both  daughters and grandson live in her home, while they do offer support they are not helpful with house chores and this is stressful for [employer].      Family Dynamics   Good Support System? Yes   [name] has her daughters and friends for support     Barriers   Psychosocial barriers to participate in program The patient should benefit from training in stress management and relaxation.      Screening Interventions   Interventions Provide feedback about the scores to participant;To provide support and resources with identified psychosocial needs;Encouraged to exercise    Expected Outcomes Long Term goal: The participant improves quality of Life and PHQ9 Scores as seen by post scores and/or verbalization of changes;Short Term goal: Identification and review with participant of any Quality of Life or Depression concerns found by scoring the questionnaire.;Long Term Goal: Stressors or current issues are controlled or eliminated.             Quality of Life Scores:  Quality of Life - 07/26/23 1623       Quality of Life   Select Quality of Life      Quality of Life Scores   Health/Function Pre 20 %    Socioeconomic Pre 26.57 %    Psych/Spiritual Pre 18.86 %    Family Pre 18 %    GLOBAL Pre 21.1 %            Scores of 19 and below usually indicate a poorer quality of life in these areas.  A difference of  2-3 points is a clinically meaningful difference.  A difference of 2-3 points in the total score of the Quality of Life Index has been associated with significant improvement in overall quality of life, self-image, physical symptoms,  and general health in studies assessing change in quality of life.  PHQ-9: Review Flowsheet  More data exists      08/02/2023 07/26/2023 07/05/2023 05/10/2023 01/19/2020  Depression screen PHQ 2/9  Decreased Interest 0 1 0 0 0  Down, Depressed, Hopeless 0 0 0 0 0  PHQ - 2 Score 0 1 0 0 0  Altered sleeping - 2 - 0 -  Tired, decreased energy - 3 - 2 -  Change in appetite - 1 - 0 -  Feeling bad or failure about yourself  - 0 - 0 -  Trouble concentrating - 0 - 0 -  Moving slowly or fidgety/restless - 0 - 0 -  Suicidal thoughts - 0 - 0 -  PHQ-9 Score - 7 - 2 -  Difficult doing work/chores - Not difficult at all - Somewhat difficult -   Interpretation of Total Score  Total Score Depression Severity:  1-4 = Minimal depression, 5-9 = Mild depression, 10-14 = Moderate depression, 15-19 = Moderately severe depression, 20-27 = Severe depression   Psychosocial Evaluation and Intervention:   Psychosocial Re-Evaluation:  Psychosocial Re-Evaluation     Row Name 08/11/23 [phone] 08/19/23 1534 09/21/23 0955 10/12/23 0905 11/09/23 1602     Psychosocial Re-Evaluation   Current issues with Current Stress Concerns;Current Anxiety/Panic Current Stress Concerns;Current Anxiety/Panic Current Stress Concerns;Current Anxiety/Panic Current Stress Concerns;Current Anxiety/Panic Current Stress Concerns;Current Anxiety/Panic   Comments Reviewed quality of life and PHq2-9.[name] says she is dissatisfied with her health due to her recent MI, Stenting and TAVR. [name] says that her adult children who live with her do not help as they should. [name] denies being depressed currently. [name] takes xanax  for anxiety. [name] says that her anxiety  is controlled. [name] sees a psychiatrist once a year.  Offered emotional support and reassurance. [name] continues to voice having family stress with her live in children. [name] also reports having stress about her respitory status as she is now wearing oxygen during exercise. Emotional support provided.  [name] continues to voice having family stress with her live in children. Emotional support provided [name] continues to voice having family stress with her live in children. Emotional support provided. [name] would benifit from couseling. [name] says she is not interested at this time. [name] continues to voice concerns about her  upcoming procedure on 11/17/23 due to hemoptysis.   Expected Outcomes [name] will have decreased or controlled anxiety/ stressors upon completion of cardiac rehab [name] will have decreased or controlled anxiety/ stressors upon completion of cardiac rehab [name] will have decreased or controlled anxiety/ stressors upon completion of cardiac rehab [name] will have decreased or controlled anxiety/ stressors upon completion of cardiac rehab [name] will have decreased or controlled anxiety/ stressors upon completion of cardiac rehab   Interventions Stress management education;Relaxation education;Encouraged to attend Cardiac Rehabilitation for the exercise Stress management education;Relaxation education;Encouraged to attend Cardiac Rehabilitation for the exercise Stress management education;Relaxation education;Encouraged to attend Cardiac Rehabilitation for the exercise Stress management education;Relaxation education;Encouraged to attend Cardiac Rehabilitation for the exercise Stress management education;Relaxation education;Encouraged to attend Cardiac Rehabilitation for the exercise   Continue Psychosocial Services  Follow up required by staff Follow up required by staff Follow up required by staff Follow up required by staff Follow up required by staff     Initial Review   Source of Stress Concerns Chronic Illness;Family Chronic Illness;Family Chronic Illness;Family Chronic Illness;Family Chronic Illness;Family   Comments Will continue to monitor and offer support as needed. Will continue to monitor and offer support as needed. Will continue to monitor and offer support as needed. Will continue to monitor and  offer support as needed. Will continue to monitor and offer support as needed.            Psychosocial Discharge (Final Psychosocial Re-Evaluation):  Psychosocial Re-Evaluation - 11/09/23 1602       Psychosocial Re-Evaluation   Current issues with Current Stress Concerns;Current Anxiety/Panic    Comments [name] continues to voice concerns about her  upcoming procedure on 11/17/23 due to hemoptysis.    Expected Outcomes [name] will have decreased or controlled anxiety/ stressors upon completion of cardiac rehab    Interventions Stress management education;Relaxation education;Encouraged to attend Cardiac Rehabilitation for the exercise    Continue Psychosocial Services  Follow up required by staff      Initial Review   Source of Stress Concerns Chronic Illness;Family    Comments Will continue to monitor and offer support as needed.             Vocational Rehabilitation: Provide vocational rehab assistance to qualifying candidates.   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 Participant will state understanding/return demonstration of topics presented.    Education     Row Name 07/28/23 1300     Education   Cardiac Education Topics P[facility]in   Set designer  Respiratory Therapist;Nurse   Weekly Topic Powerhouse Plant-Based Proteins   Instruction Review Code 1- Verbalizes Understanding   Class Start Time 1150   Class Stop Time 1225   Class Time Calculation (min) 35 min    Row Name 08/09/23 1300     Education   Cardiac Education Topics [facility]in   Nurse, children's Exercise Physiologist   Select  Psychosocial   Psychosocial Healthy Minds, Bodies, Hearts   Instruction Review Code 1- Verbalizes Understanding   Class Start Time 1155   Class Stop Time 1240   Class Time Calculation (min) 45 min    Row Name 08/16/23 1300     Education   Cardiac Education Topics P[facility]ikin   Select Core Videos     Core Videos   Educator Nurse   Select Nutrition   Nutrition Becoming a [facility]in Chef   Instruction Review Code 1- Verbalizes Understanding   Class Start Time 1150   Class Stop Time 1226   Class Time Calculation (min) 36 min    Row Name 08/18/23 1300     Education   Cardiac Education Topics [facility]in   Secondary school teacher School   Educator Dietitian;Nurse   Weekly Topic Personalizing Your Pritikin Plate   Instruction Review Code 1- Verbalizes Understanding   Class Start Time 1149   Class Stop Time 1233   Class Time Calculation (min) 44 min    Row Name 08/23/23 1600     Education   Cardiac Education Topics Pritikin   Select Workshops     Workshops   Educator Exercise Physiologist   Select Psychosocial   Psychosocial Workshop Recognizing and Reducing Stress   Instruction Review Code 1- Verbalizes Understanding   Class Start Time 1145   Class Stop Time 1228   Class Time Calculation (min) 43 min    Row Name 08/25/23 1400     Education   Cardiac Education Topics Pritikin   Customer service manager   Weekly Topic [employer] Desserts   Instruction Review Code 1- Verbalizes Understanding   Class Start Time 1145   Class Stop Time 1222   Class Time Calculation (min) 37 min    Row Name 08/30/23 1300     Education   Cardiac Education Topics [facility]in   [employer]     Workshops   Educator Exercise Physiologist   Select Exercise   Exercise Workshop Exercise Basics: Building Your Action Plan   Instruction Review Code 1- Verbalizes Understanding   Class Start Time 1216   Class Stop Time 1235   Class Time  Calculation (min) 19 min    Row Name 09/01/23 1600     Education   Cardiac Education Topics Pritikin   Customer service manager   Weekly Topic Efficiency Cooking - Meals in a Snap   Instruction Review Code 1- Verbalizes Understanding   Class Start Time 1145   Class Stop Time 1225   Class Time Calculation (min) 40 min    Row Name 09/13/23 1300     Education   Cardiac Education Topics Pritikin   Geographical information systems officer Psychosocial   Psychosocial Workshop Focused Goals, Sustainable Changes   Instruction Review Code 1- Verbalizes Understanding   Class Start Time 1144   Class Stop  Time 1218   Class Time Calculation (min) 34 min    Row Name 09/15/23 1300     Education   Cardiac Education Topics Pritikin   Orthoptist   Educator Dietitian   Weekly Topic Comforting Weekend Breakfasts   Instruction Review Code 1- Verbalizes Understanding   Class Start Time 1145   Class Stop Time 1228   Class Time Calculation (min) 43 min    Row Name 09/20/23 1200     Education   Cardiac Education Topics Pritikin   Select Core Videos     Core Videos   Educator Exercise Physiologist   Select Exercise Education   Exercise Education Biomechanial Limitations   Instruction Review Code 1- Verbalizes Understanding   Class Start Time 1145   Class Stop Time 1218   Class Time Calculation (min) 33 min    Row Name 09/27/23 1500     Education   Cardiac Education Topics Prit[facility]in   Glass blower/designer Nutrition   Nutrition Workshop Fueling a Forensic psychologist   Instruction Review Code 1- TEFL teacher Understanding   Class Start Time 1145   Class Stop Time 1230   Class Time Calculation (min) 45 min    Row Name 09/29/23 1500     Education   Cardiac Education Topics Pritikin   Optometrist   Weekly Topic International Cuisine- Spotlight on the [employer] Zones   Instruction Review Code 1- Verbalizes Understanding   Class Start Time 1145   Class Stop Time 1225   Class Time Calculation (min) 40 min    Row Name 10/11/23 1300     Education   Cardiac Education Topics Pritikin   Select Workshops     Workshops   Educator Exercise Physiologist   Select Exercise   Exercise Workshop Managing Heart Disease: Your Path to a Healthier Heart   Instruction Review Code 1- Verbalizes Understanding   Class Start Time 1145   Class Stop Time 1237   Class Time Calculation (min) 52 min    Row Name 10/13/23 1300     Education   Cardiac Education Topics Pritikin   Customer service manager   Weekly Topic Powerhouse Plant-Based Proteins   Instruction Review Code 1- Verbalizes Understanding   Class Start Time 1145   Class Stop Time 1222   Class Time Calculation (min) 37 min    Row Name 10/18/23 1300     Education   Cardiac Education Topics Pritikin   Geographical information systems officer Psychosocial   Psychosocial Workshop From Head to Heart: The Power of a Healthy Outlook   Instruction Review Code 1- Verbalizes Understanding   Class Start Time 1147   Class Stop Time 1234   Class Time Calculation (min) 47 min    Row Name 10/20/23 1600     Education   Cardiac Education Topics Pritikin   Orthoptist   Educator Dietitian   Weekly Topic Tasty Appetizers and Snacks   Instruction Review Code 1- Verbalizes Understanding   Class Start Time 1145   Class Stop Time 1225   Class Time Calculation (min) 40 min    Row Name 10/27/23 1200     Education  Cardiac Education Topics Pritikin   Orthoptist   Educator Dietitian   Weekly Topic Adding Flavor - Sodium-Free   Instruction Review Code 1- Verbalizes Understanding   Class Start Time  1145   Class Stop Time 1221   Class Time Calculation (min) 36 min    Row Name 11/01/23 1300     Education   Cardiac Education Topics Pritikin   Glass blower/designer Nutrition   Nutrition Workshop Label Reading   Instruction Review Code 1- Verbalizes Understanding   Class Start Time 1145   Class Stop Time 1228   Class Time Calculation (min) 43 min    Row Name 11/03/23 1200     Education   Cardiac Education Topics Pritikin   Nurse, children's Exercise Physiologist   Select Nutrition   Nutrition Other   Instruction Review Code 1- Verbalizes Understanding   Class Start Time 1152   Class Stop Time 1230   Class Time Calculation (min) 38 min    Row Name 11/05/23 1500     Education   Cardiac Education Topics Pritikin   Customer service manager   Weekly Topic Fast and Healthy Breakfasts   Instruction Review Code 1- Verbalizes Understanding   Class Start Time 1400   Class Stop Time 1440   Class Time Calculation (min) 40 min    Row Name 11/08/23 1200     Education   Cardiac Education Topics Pritikin   Hospital doctor Education   General Education Metabolic Syndrome and Belly Fat   Instruction Review Code 1- Verbalizes Understanding   Class Start Time 1150   Class Stop Time 1230   Class Time Calculation (min) 40 min            Core Videos: Exercise    Move It!  Clinical staff conducted group or individual video education with verbal and written material and guidebook.  Patient learns the recommended [facility]in exercise program. Exercise with the goal of living a long, healthy life. Some of the health benefits of exercise include controlled diabetes, healthier blood pressure levels, improved cholesterol levels, improved heart and lung capacity, improved sleep, and better body composition.  Everyone should speak with their doctor before starting or changing an exercise routine.  Biomechanical Limitations Clinical staff conducted group or individual video education with verbal and written material and guidebook.  Patient learns how biomechanical limitations can impact exercise and how we can mitigate and possibly overcome limitations to have an impactful and balanced exercise routine.  Body Composition Clinical staff conducted group or individual video education with verbal and written material and guidebook.  Patient learns that body composition (ratio of muscle mass to fat mass) is a key component to assessing overall fitness, rather than body weight alone. Increased fat mass, especially visceral belly fat, can put us  at increased risk for metabolic syndrome, type 2 diabetes, heart disease, and even death. It is recommended to combine diet and exercise (cardiovascular and resistance training) to improve your body composition. Seek guidance from your physician and exercise physiologist before implementing an exercise routine.  Exercise Action Plan Clinical staff conducted group or individual video education with verbal and written material and guidebook.  Patient learns the  recommended strategies to achieve and enjoy long-term exercise adherence, including variety, self-motivation, self-efficacy, and positive decision making. Benefits of exercise include fitness, good health, weight management, more energy, better sleep, less stress, and overall well-being.  Medical   Heart Disease Risk Reduction Clinical staff conducted group or individual video education with verbal and written material and guidebook.  Patient learns our heart is our most vital organ as it circulates oxygen, nutrients, white blood cells, and hormones throughout the entire body, and carries waste away. Data supports a plant-based eating plan like the Pritikin Program for its effectiveness in slowing progression of and  reversing heart disease. The video provides a number of recommendations to address heart disease.   Metabolic Syndrome and Belly Fat  Clinical staff conducted group or individual video education with verbal and written material and guidebook.  Patient learns what metabolic syndrome is, how it leads to heart disease, and how one can reverse it and keep it from coming back. You have metabolic syndrome if you have 3 of the following 5 criteria: abdominal obesity, high blood pressure, high triglycerides, low HDL cholesterol, and high blood sugar.  Hypertension and Heart Disease Clinical staff conducted group or individual video education with verbal and written material and guidebook.  Patient learns that high blood pressure, or hypertension, is very common in the [place] . Hypertension is largely due to excessive salt intake, but other important risk factors include being overweight, physical inactivity, drinking too much alcohol, smoking, and not eating enough potassium from fruits and vegetables. High blood pressure is a leading risk factor for heart attack, stroke, congestive heart failure, dementia, kidney failure, and premature death. Long-term effects of excessive salt intake include stiffening of the arteries and thickening of heart muscle and organ damage. Recommendations include ways to reduce hypertension and the risk of heart disease.  Diseases of Our Time - Focusing on Diabetes Clinical staff conducted group or individual video education with verbal and written material and guidebook.  Patient learns why the best way to stop diseases of our time is prevention, through food and other lifestyle changes. Medicine (such as prescription pills and surgeries) is often only a Band-Aid on the problem, not a long-term solution. Most common diseases of our time include obesity, type 2 diabetes, hypertension, heart disease, and cancer. The Pritikin Program is recommended and has been proven to help  reduce, reverse, and/or prevent the damaging effects of metabolic syndrome.  Nutrition   Overview of the Pritikin Eating Plan  Clinical staff conducted group or individual video education with verbal and written material and guidebook.  Patient learns about the Pritikin Eating Plan for disease risk reduction. The Pritikin Eating Plan emphasizes a wide variety of unrefined, minimally-processed carbohydrates, like fruits, vegetables, whole grains, and legumes. Go, Caution, and Stop food choices are explained. Plant-based and lean animal proteins are emphasized. Rationale provided for low sodium intake for blood pressure control, low added sugars for blood sugar stabilization, and low added fats and oils for coronary artery disease risk reduction and weight management.  Calorie Density  Clinical staff conducted group or individual video education with verbal and written material and guidebook.  Patient learns about calorie density and how it impacts the Pritikin Eating Plan. Knowing the characteristics of the food you choose will help you decide whether those foods will lead to weight gain or weight loss, and whether you want to consume more or less of them. Weight loss is usually a side effect of the Pritikin Eating Plan because  of its focus on low calorie-dense foods.  Label Reading  Clinical staff conducted group or individual video education with verbal and written material and guidebook.  Patient learns about the Pritikin recommended label reading guidelines and corresponding recommendations regarding calorie density, added sugars, sodium content, and whole grains.  Dining Out - Part 1  Clinical staff conducted group or individual video education with verbal and written material and guidebook.  Patient learns that restaurant meals can be sabotaging because they can be so high in calories, fat, sodium, and/or sugar. Patient learns recommended strategies on how to positively address this and avoid  unhealthy pitfalls.  Facts on Fats  Clinical staff conducted group or individual video education with verbal and written material and guidebook.  Patient learns that lifestyle modifications can be just as effective, if not more so, as many medications for lowering your risk of heart disease. A Pritikin lifestyle can help to reduce your risk of inflammation and atherosclerosis (cholesterol build-up, or plaque, in the artery walls). Lifestyle interventions such as dietary choices and physical activity address the cause of atherosclerosis. A review of the types of fats and their impact on blood cholesterol levels, along with dietary recommendations to reduce fat intake is also included.  Nutrition Action Plan  Clinical staff conducted group or individual video education with verbal and written material and guidebook.  Patient learns how to incorporate Pritikin recommendations into their lifestyle. Recommendations include planning and keeping personal health goals in mind as an important part of their success.  Healthy Mind-Set    Healthy Minds, Bodies, Hearts  Clinical staff conducted group or individual video education with verbal and written material and guidebook.  Patient learns how to identify when they are stressed. Video will discuss the impact of that stress, as well as the many benefits of stress management. Patient will also be introduced to stress management techniques. The way we think, act, and feel has an impact on our hearts.  How Our Thoughts Can Heal Our Hearts  Clinical staff conducted group or individual video education with verbal and written material and guidebook.  Patient learns that negative thoughts can cause depression and anxiety. This can result in negative lifestyle behavior and serious health problems. Cognitive behavioral therapy is an effective method to help control our thoughts in order to change and improve our emotional outlook.  Additional Videos:  Exercise     Improving Performance  Clinical staff conducted group or individual video education with verbal and written material and guidebook.  Patient learns to use a non-linear approach by alternating intensity levels and lengths of time spent exercising to help burn more calories and lose more body fat. Cardiovascular exercise helps improve heart health, metabolism, hormonal balance, blood sugar control, and recovery from fatigue. Resistance training improves strength, endurance, balance, coordination, reaction time, metabolism, and muscle mass. Flexibility exercise improves circulation, posture, and balance. Seek guidance from your physician and exercise physiologist before implementing an exercise routine and learn your capabilities and proper form for all exercise.  Introduction to Yoga  Clinical staff conducted group or individual video education with verbal and written material and guidebook.  Patient learns about yoga, a discipline of the coming together of mind, breath, and body. The benefits of yoga include improved flexibility, improved range of motion, better posture and core strength, increased lung function, weight loss, and positive self-image. Yoga's heart health benefits include lowered blood pressure, healthier heart rate, decreased cholesterol and triglyceride levels, improved immune function, and reduced stress. Seek guidance  from your physician and exercise physiologist before implementing an exercise routine and learn your capabilities and proper form for all exercise.  Medical   Aging: Enhancing Your Quality of Life  Clinical staff conducted group or individual video education with verbal and written material and guidebook.  Patient learns key strategies and recommendations to stay in good physical health and enhance quality of life, such as prevention strategies, having an advocate, securing a Health Care Proxy and Power of Attorney, and keeping a list of medications and system for  tracking them. It also discusses how to avoid risk for bone loss.  Biology of Weight Control  Clinical staff conducted group or individual video education with verbal and written material and guidebook.  Patient learns that weight gain occurs because we consume more calories than we burn (eating more, moving less). Even if your body weight is normal, you may have higher ratios of fat compared to muscle mass. Too much body fat puts you at increased risk for cardiovascular disease, heart attack, stroke, type 2 diabetes, and obesity-related cancers. In addition to exercise, following the Pritikin Eating Plan can help reduce your risk.  Decoding Lab Results  Clinical staff conducted group or individual video education with verbal and written material and guidebook.  Patient learns that lab test reflects one measurement whose values change over time and are influenced by many factors, including medication, stress, sleep, exercise, food, hydration, pre-existing medical conditions, and more. It is recommended to use the knowledge from this video to become more involved with your lab results and evaluate your numbers to speak with your doctor.   Diseases of Our Time - Overview  Clinical staff conducted group or individual video education with verbal and written material and guidebook.  Patient learns that according to the CDC, 50% to 70% of chronic diseases (such as obesity, type 2 diabetes, elevated lipids, hypertension, and heart disease) are avoidable through lifestyle improvements including healthier food choices, listening to satiety cues, and increased physical activity.  Sleep Disorders Clinical staff conducted group or individual video education with verbal and written material and guidebook.  Patient learns how good quality and duration of sleep are important to overall health and well-being. Patient also learns about sleep disorders and how they impact health along with recommendations to address  them, including discussing with a physician.  Nutrition  Dining Out - Part 2 Clinical staff conducted group or individual video education with verbal and written material and guidebook.  Patient learns how to plan ahead and communicate in order to maximize their dining experience in a healthy and nutritious manner. Included are recommended food choices based on the type of restaurant the patient is visiting.   Fueling a Banker conducted group or individual video education with verbal and written material and guidebook.  There is a strong connection between our food choices and our health. Diseases like obesity and type 2 diabetes are very prevalent and are in large-part due to lifestyle choices. The Pritikin Eating Plan provides plenty of food and hunger-curbing satisfaction. It is easy to follow, affordable, and helps reduce health risks.  Menu Workshop  Clinical staff conducted group or individual video education with verbal and written material and guidebook.  Patient learns that restaurant meals can sabotage health goals because they are often packed with calories, fat, sodium, and sugar. Recommendations include strategies to plan ahead and to communicate with the manager, chef, or server to help order a healthier meal.  Planning Your Eating  Strategy  Clinical staff conducted group or individual video education with verbal and written material and guidebook.  Patient learns about the Pritikin Eating Plan and its benefit of reducing the risk of disease. The Pritikin Eating Plan does not focus on calories. Instead, it emphasizes high-quality, nutrient-rich foods. By knowing the characteristics of the foods, we choose, we can determine their calorie density and make informed decisions.  Targeting Your Nutrition Priorities  Clinical staff conducted group or individual video education with verbal and written material and guidebook.  Patient learns that lifestyle habits have  a tremendous impact on disease risk and progression. This video provides eating and physical activity recommendations based on your personal health goals, such as reducing LDL cholesterol, losing weight, preventing or controlling type 2 diabetes, and reducing high blood pressure.  Vitamins and Minerals  Clinical staff conducted group or individual video education with verbal and written material and guidebook.  Patient learns different ways to obtain key vitamins and minerals, including through a recommended healthy diet. It is important to discuss all supplements you take with your doctor.   Healthy Mind-Set    Smoking Cessation  Clinical staff conducted group or individual video education with verbal and written material and guidebook.  Patient learns that cigarette smoking and tobacco addiction pose a serious health risk which affects millions of people. Stopping smoking will significantly reduce the risk of heart disease, lung disease, and many forms of cancer. Recommended strategies for quitting are covered, including working with your doctor to develop a successful plan.  Culinary   Becoming a Set designer conducted group or individual video education with verbal and written material and guidebook.  Patient learns that cooking at home can be healthy, cost-effective, quick, and puts them in control. Keys to cooking healthy recipes will include looking at your recipe, assessing your equipment needs, planning ahead, making it simple, choosing cost-effective seasonal ingredients, and limiting the use of added fats, salts, and sugars.  Cooking - Breakfast and Snacks  Clinical staff conducted group or individual video education with verbal and written material and guidebook.  Patient learns how important breakfast is to satiety and nutrition through the entire day. Recommendations include key foods to eat during breakfast to help stabilize blood sugar levels and to prevent  overeating at meals later in the day. Planning ahead is also a key component.  Cooking - Educational psychologist conducted group or individual video education with verbal and written material and guidebook.  Patient learns eating strategies to improve overall health, including an approach to cook more at home. Recommendations include thinking of animal protein as a side on your plate rather than center stage and focusing instead on lower calorie dense options like vegetables, fruits, whole grains, and plant-based proteins, such as beans. Making sauces in large quantities to freeze for later and leaving the skin on your vegetables are also recommended to maximize your experience.  Cooking - Healthy Salads and Dressing Clinical staff conducted group or individual video education with verbal and written material and guidebook.  Patient learns that vegetables, fruits, whole grains, and legumes are the foundations of the Pritikin Eating Plan. Recommendations include how to incorporate each of these in flavorful and healthy salads, and how to create homemade salad dressings. Proper handling of ingredients is also covered. Cooking - Soups and [employer] - Soups and Desserts Clinical staff conducted group or individual video education with verbal and written material and guidebook.  Patient learns  that Pritikin soups and desserts make for easy, nutritious, and delicious snacks and meal components that are low in sodium, fat, sugar, and calorie density, while high in vitamins, minerals, and filling fiber. Recommendations include simple and healthy ideas for soups and desserts.   Overview     The Pritikin Solution Program Overview Clinical staff conducted group or individual video education with verbal and written material and guidebook.  Patient learns that the results of the Pritikin Program have been documented in more than 100 articles published in peer-reviewed journals, and the benefits  include reducing risk factors for (and, in some cases, even reversing) high cholesterol, high blood pressure, type 2 diabetes, obesity, and more! An overview of the three key pillars of the Pritikin Program will be covered: eating well, doing regular exercise, and having a healthy mind-set.  WORKSHOPS  Exercise: Exercise Basics: Building Your Action Plan Clinical staff led group instruction and group discussion with PowerPoint presentation and patient guidebook. To enhance the learning environment the use of posters, models and videos may be added. At the conclusion of this workshop, patients will comprehend the difference between physical activity and exercise, as well as the benefits of incorporating both, into their routine. Patients will understand the FITT (Frequency, Intensity, Time, and Type) principle and how to use it to build an exercise action plan. In addition, safety concerns and other considerations for exercise and cardiac rehab will be addressed by the presenter. The purpose of this lesson is to promote a comprehensive and effective weekly exercise routine in order to improve patients' overall level of fitness.   Managing Heart Disease: Your Path to a Healthier Heart Clinical staff led group instruction and group discussion with PowerPoint presentation and patient guidebook. To enhance the learning environment the use of posters, models and videos may be added.At the conclusion of this workshop, patients will understand the anatomy and physiology of the heart. Additionally, they will understand how Pritikin's three pillars impact the risk factors, the progression, and the management of heart disease.  The purpose of this lesson is to provide a high-level overview of the heart, heart disease, and how the Pritikin lifestyle positively impacts risk factors.  Exercise Biomechanics Clinical staff led group instruction and group discussion with PowerPoint presentation and patient  guidebook. To enhance the learning environment the use of posters, models and videos may be added. Patients will learn how the structural parts of their bodies function and how these functions impact their daily activities, movement, and exercise. Patients will learn how to promote a neutral spine, learn how to manage pain, and identify ways to improve their physical movement in order to promote healthy living. The purpose of this lesson is to expose patients to common physical limitations that impact physical activity. Participants will learn practical ways to adapt and manage aches and pains, and to minimize their effect on regular exercise. Patients will learn how to maintain good posture while sitting, walking, and lifting.  Balance Training and Fall Prevention  Clinical staff led group instruction and group discussion with PowerPoint presentation and patient guidebook. To enhance the learning environment the use of posters, models and videos may be added. At the conclusion of this workshop, patients will understand the importance of their sensorimotor skills (vision, proprioception, and the vestibular system) in maintaining their ability to balance as they age. Patients will apply a variety of balancing exercises that are appropriate for their current level of function. Patients will understand the common causes for poor balance, possible solutions  to these problems, and ways to modify their physical environment in order to minimize their fall risk. The purpose of this lesson is to teach patients about the importance of maintaining balance as they age and ways to minimize their risk of falling.  WORKSHOPS   Nutrition:  Fueling a Ship broker led group instruction and group discussion with PowerPoint presentation and patient guidebook. To enhance the learning environment the use of posters, models and videos may be added. Patients will review the foundational principles of the  Pritikin Eating Plan and understand what constitutes a serving size in each of the food groups. Patients will also learn Pritikin-friendly foods that are better choices when away from home and review make-ahead meal and snack options. Calorie density will be reviewed and applied to three nutrition priorities: weight maintenance, weight loss, and weight gain. The purpose of this lesson is to reinforce (in a group setting) the key concepts around what patients are recommended to eat and how to apply these guidelines when away from home by planning and selecting Pritikin-friendly options. Patients will understand how calorie density may be adjusted for different weight management goals.  Mindful Eating  Clinical staff led group instruction and group discussion with PowerPoint presentation and patient guidebook. To enhance the learning environment the use of posters, models and videos may be added. Patients will briefly review the concepts of the Pritikin Eating Plan and the importance of low-calorie dense foods. The concept of mindful eating will be introduced as well as the importance of paying attention to internal hunger signals. Triggers for non-hunger eating and techniques for dealing with triggers will be explored. The purpose of this lesson is to provide patients with the opportunity to review the basic principles of the Pritikin Eating Plan, discuss the value of eating mindfully and how to measure internal cues of hunger and fullness using the Hunger Scale. Patients will also discuss reasons for non-hunger eating and learn strategies to use for controlling emotional eating.  Targeting Your Nutrition Priorities Clinical staff led group instruction and group discussion with PowerPoint presentation and patient guidebook. To enhance the learning environment the use of posters, models and videos may be added. Patients will learn how to determine their genetic susceptibility to disease by reviewing their  family history. Patients will gain insight into the importance of diet as part of an overall healthy lifestyle in mitigating the impact of genetics and other environmental insults. The purpose of this lesson is to provide patients with the opportunity to assess their personal nutrition priorities by looking at their family history, their own health history and current risk factors. Patients will also be able to discuss ways of prioritizing and modifying the Pritikin Eating Plan for their highest risk areas  Menu  Clinical staff led group instruction and group discussion with PowerPoint presentation and patient guidebook. To enhance the learning environment the use of posters, models and videos may be added. Using menus brought in from [employer], or printed from [employer], patients will apply the Pritikin dining out guidelines that were presented in the [employer] video. Patients will also be able to practice these guidelines in a variety of provided scenarios. The purpose of this lesson is to provide patients with the opportunity to practice hands-on learning of the Pritikin Dining Out guidelines with actual menus and practice scenarios.  Label Reading Clinical staff led group instruction and group discussion with PowerPoint presentation and patient guidebook. To enhance the learning environment the use  of posters, models and videos may be added. Patients will review and discuss the Pritikin label reading guidelines presented in Pritikin's Label Reading Educational series video. Using fool labels brought in from local grocery stores and markets, patients will apply the label reading guidelines and determine if the packaged food meet the Pritikin guidelines. The purpose of this lesson is to provide patients with the opportunity to review, discuss, and practice hands-on learning of the Pritikin Label Reading guidelines with actual packaged food labels. Cooking School  Pritikin's  [employer] are designed to teach patients ways to prepare quick, simple, and affordable recipes at home. The importance of nutrition's role in chronic disease risk reduction is reflected in its emphasis in the overall Pritikin program. By learning how to prepare essential core Pritikin Eating Plan recipes, patients will increase control over what they eat; be able to customize the flavor of foods without the use of added salt, sugar, or fat; and improve the quality of the food they consume. By learning a set of core recipes which are easily assembled, quickly prepared, and affordable, patients are more likely to prepare more healthy foods at home. These workshops focus on convenient breakfasts, simple entres, side dishes, and desserts which can be prepared with minimal effort and are consistent with nutrition recommendations for cardiovascular risk reduction. Cooking [employer] are taught by a Armed forces logistics/support/administrative officer (RD) who has been trained by the [employer]. The chef or RD has a clear understanding of the importance of minimizing - if not completely eliminating - added fat, sugar, and sodium in recipes. Throughout the series of Cooking School Workshop sessions, patients will learn about healthy ingredients and efficient methods of cooking to build confidence in their capability to prepare    Cooking School weekly topics:  Adding Flavor- Sodium-Free  Fast and Healthy Breakfasts  Powerhouse Plant-Based Proteins  Satisfying Salads and Dressings  Simple Sides and Sauces  International Cuisine-Spotlight on the [employer] Zones  Delicious Desserts  Savory Soups  [employer] - Meals in a Astronomer Appetizers and Snacks  Comforting Weekend Breakfasts  One-Pot Wonders   Fast Evening Meals  Landscape architect Your Pritikin Plate  WORKSHOPS   Healthy Mindset (Psychosocial):  Focused Goals, Sustainable Changes Clinical staff led group  instruction and group discussion with PowerPoint presentation and patient guidebook. To enhance the learning environment the use of posters, models and videos may be added. Patients will be able to apply effective goal setting strategies to establish at least one personal goal, and then take consistent, meaningful action toward that goal. They will learn to identify common barriers to achieving personal goals and develop strategies to overcome them. Patients will also gain an understanding of how our mind-set can impact our ability to achieve goals and the importance of cultivating a positive and growth-oriented mind-set. The purpose of this lesson is to provide patients with a deeper understanding of how to set and achieve personal goals, as well as the tools and strategies needed to overcome common obstacles which may arise along the way.  From Head to Heart: The Power of a Healthy Outlook  Clinical staff led group instruction and group discussion with PowerPoint presentation and patient guidebook. To enhance the learning environment the use of posters, models and videos may be added. Patients will be able to recognize and describe the impact of emotions and mood on physical health. They will discover the importance of self-care and explore self-care practices which may  work for them. Patients will also learn how to utilize the 4 C's to cultivate a healthier outlook and better manage stress and challenges. The purpose of this lesson is to demonstrate to patients how a healthy outlook is an essential part of maintaining good health, especially as they continue their cardiac rehab journey.  Healthy Sleep for a Healthy Heart Clinical staff led group instruction and group discussion with PowerPoint presentation and patient guidebook. To enhance the learning environment the use of posters, models and videos may be added. At the conclusion of this workshop, patients will be able to demonstrate knowledge of the  importance of sleep to overall health, well-being, and quality of life. They will understand the symptoms of, and treatments for, common sleep disorders. Patients will also be able to identify daytime and nighttime behaviors which impact sleep, and they will be able to apply these tools to help manage sleep-related challenges. The purpose of this lesson is to provide patients with a general overview of sleep and outline the importance of quality sleep. Patients will learn about a few of the most common sleep disorders. Patients will also be introduced to the concept of "sleep hygiene," and discover ways to self-manage certain sleeping problems through simple daily behavior changes. Finally, the workshop will motivate patients by clarifying the links between quality sleep and their goals of heart-healthy living.   Recognizing and Reducing Stress Clinical staff led group instruction and group discussion with PowerPoint presentation and patient guidebook. To enhance the learning environment the use of posters, models and videos may be added. At the conclusion of this workshop, patients will be able to understand the types of stress reactions, differentiate between acute and chronic stress, and recognize the impact that chronic stress has on their health. They will also be able to apply different coping mechanisms, such as reframing negative self-talk. Patients will have the opportunity to practice a variety of stress management techniques, such as deep abdominal breathing, progressive muscle relaxation, and/or guided imagery.  The purpose of this lesson is to educate patients on the role of stress in their lives and to provide healthy techniques for coping with it.  Learning Barriers/Preferences:  Learning Barriers/Preferences - 07/26/23 1425       Learning Barriers/Preferences   Learning Barriers None    Learning Preferences Audio;Computer/Internet;Group Instruction;Skilled Demonstration;Verbal  Instruction;Video;Written Material;Pictoral;Individual Instruction             Education Topics:  Knowledge Questionnaire Score:  Knowledge Questionnaire Score - 07/26/23 1425       Knowledge Questionnaire Score   Pre Score 24/24             Core Components/Risk Factors/Patient Goals at Admission:  Personal Goals and Risk Factors at Admission - 07/26/23 1432       Core Components/Risk Factors/Patient Goals on Admission    Weight Management Yes;Obesity;Weight Loss    Intervention Weight Management: Develop a combined nutrition and exercise program designed to reach desired caloric intake, while maintaining appropriate intake of nutrient and fiber, sodium and fats, and appropriate energy expenditure required for the weight goal.;Weight Management: Provide education and appropriate resources to help participant work on and attain dietary goals.;Weight Management/Obesity: Establish reasonable short term and long term weight goals.;Obesity: Provide education and appropriate resources to help participant work on and attain dietary goals.    Goal Weight: Long Term 170 lb (77.1 kg)   pt goal   Heart Failure Yes    Intervention Provide a combined exercise and nutrition program that  is supplemented with education, support and counseling about heart failure. Directed toward relieving symptoms such as shortness of breath, decreased exercise tolerance, and extremity edema.    Expected Outcomes Improve functional capacity of life;Short term: Attendance in program 2-3 days a week with increased exercise capacity. Reported lower sodium intake. Reported increased fruit and vegetable intake. Reports medication compliance.;Short term: Daily weights obtained and reported for increase. Utilizing diuretic protocols set by physician.;Long term: Adoption of self-care skills and reduction of barriers for early signs and symptoms recognition and intervention leading to self-care maintenance.    Hypertension  Yes    Intervention Provide education on lifestyle modifcations including regular physical activity/exercise, weight management, moderate sodium restriction and increased consumption of fresh fruit, vegetables, and low fat dairy, alcohol moderation, and smoking cessation.;Monitor prescription use compliance.    Expected Outcomes Short Term: Continued assessment and intervention until BP is < 140/20mm HG in hypertensive participants. < 130/12mm HG in hypertensive participants with diabetes, heart failure or chronic kidney disease.;Long Term: Maintenance of blood pressure at goal levels.    Lipids Yes    Intervention Provide education and support for participant on nutrition & aerobic/resistive exercise along with prescribed medications to achieve LDL 70mg , HDL >40mg .    Expected Outcomes Short Term: Participant states understanding of desired cholesterol values and is compliant with medications prescribed. Participant is following exercise prescription and nutrition guidelines.;Long Term: Cholesterol controlled with medications as prescribed, with individualized exercise RX and with personalized nutrition plan. Value goals: LDL < 70mg , HDL > 40 mg.    Stress Yes    Intervention Offer individual and/or small group education and counseling on adjustment to heart disease, stress management and health-related lifestyle change. Teach and support self-help strategies.;Refer participants experiencing significant psychosocial distress to appropriate mental health specialists for further evaluation and treatment. When possible, include family members and significant others in education/counseling sessions.    Expected Outcomes Short Term: Participant demonstrates changes in health-related behavior, relaxation and other stress management skills, ability to obtain effective social support, and compliance with psychotropic medications if prescribed.;Long Term: Emotional wellbeing is indicated by absence of clinically  significant psychosocial distress or social isolation.             Core Components/Risk Factors/Patient Goals Review:   Goals and Risk Factor Review     Row Name 08/11/23 1610 08/19/23 1537 09/21/23 0958 10/12/23 0908 11/09/23 1605     Core Components/Risk Factors/Patient Goals Review   Personal Goals Review Weight Management/Obesity;Heart Failure;Lipids;Hypertension;Stress;Improve shortness of breath with ADL's Weight Management/Obesity;Heart Failure;Lipids;Hypertension;Stress;Improve shortness of breath with ADL's Weight Management/Obesity;Heart Failure;Lipids;Hypertension;Stress;Improve shortness of breath with ADL's Weight Management/Obesity;Heart Failure;Lipids;Hypertension;Stress;Improve shortness of breath with ADL's Weight Management/Obesity;Heart Failure;Lipids;Hypertension;Stress;Improve shortness of breath with ADL's   Review Dell started cardiac rehab on 07/28/23. Raziah is off to a good start to exercise. oxygen saturatiobns have been consistantly dropping into the mid 80's. Order obtained to use oxygen with exercise. Patient agreeable.  Will use oxygen on next scheduled visit. Merlina is enjoying participating in cardiac rehab so far. Tamina is doing well with exercise at  cardiac rehab. Vital signs and oxygen saturations have been stable now that [name] is wearing oxygen with exercise. oxygen saturations have been in the low to mid 90's on 2l/min. Mckay has an upcoming appointment with her pulmonolgist in March. Shauntia is doing well with exercise at  cardiac rehab. Vital signs and oxygen saturations have been stable now that [name] is wearing oxygen with exercise. Oxygen saturations have remained in the low to  mid 90's on 2l/min. [name] would benefit from participating in pulmonary rehab when she completes cardiac rehab. [name] says she is more interested in water aerobics. [name] has lost 1.7 kg since starting cardiac rehab. Maly is doing well with exercise at  cardiac rehab. Vital signs  have been stable.  Oxygen saturations have remained in the upper 80's to  mid 90's on 2l/min. [name] would benefit from participating in pulmonary rehab when she completes cardiac rehab. [name] says she is more interested in water aerobics. [name] has lost 2.0 kg since starting cardiac rehab. Will notify Dr Filiberto Hug about oxygen saturations. Rylynn is doing well with exercise at  cardiac rehab. Vital signs have been stable.  Oxygen saturations have remained in the upper 80's to  mid 90's on 2l/min. [name] would benefit from participating in pulmonary rehab when she completes cardiac rehab. [name] has lost 4 kg since starting cardiac rehab. [name] will complete cardiac rehab on 11/17/23   Expected Outcomes Laveah will continue to participate in cardiac rehab for exercise, nutrition and lifestyle modifications Koralynn will continue to participate in cardiac rehab for exercise, nutrition and lifestyle modifications Chasya will continue to participate in cardiac rehab for exercise, nutrition and lifestyle modifications Saydi will continue to participate in cardiac rehab for exercise, nutrition and lifestyle modifications Shauniece will continue to participate in cardiac rehab for exercise, nutrition and lifestyle modifications            Core Components/Risk Factors/Patient Goals at Discharge (Final Review):   Goals and Risk Factor Review - 11/09/23 1605       Core Components/Risk Factors/Patient Goals Review   Personal Goals Review Weight Management/Obesity;Heart Failure;Lipids;Hypertension;Stress;Improve shortness of breath with ADL's    Review Brigid is doing well with exercise at  cardiac rehab. Vital signs have been stable.  Oxygen saturations have remained in the upper 80's to  mid 90's on 2l/min. [name] would benefit from participating in pulmonary rehab when she completes cardiac rehab. [name] has lost 4 kg since starting cardiac rehab. [name] will complete cardiac rehab on 11/17/23    Expected Outcomes Danicia will  continue to participate in cardiac rehab for exercise, nutrition and lifestyle modifications             ITP Comments:  ITP Comments     Row Name 07/26/23 1417 08/19/23 1533 09/21/23 0954 10/12/23 0904 11/09/23 1601   ITP Comments Dr. Gaylyn Keas medical director. Introduction to pritikin education/intensive cardiac rehab. Initial orientation packet reviewed with patient. 30 Day ITP Review. [name] has good attendance and participation with exercise at cardiac rehab. [name] is off to a good start to exercise for her fitness level. 30 Day ITP Review. [name] continues to have good attendance and participation with exercise at cardiac rehab. 30 Day ITP Review. [name] continues to have good attendance and participation with exercise at cardiac rehab. [name] will complete cardiac rehab in the begining of May. 30 Day ITP Review. [name] continues to have good attendance and participation with exercise at cardiac rehab. [name] will complete cardiac rehab on May 28 after her bronchoscopy is completed            Comments: See ITP comments.Monte Antonio RN BSN

## 2023-11-09 NOTE — Progress Notes (Signed)
 PCP - Aris Kuster, NP Cardiologist - Dr. Fransico Ivy  PPM/ICD - denies   Chest x-ray - 09/08/23 EKG - 09/06/23- not released, called office to release, pt also had EKG 03/17/23. Stress Test - 08/18/22 ECHO - 07/07/23 Cardiac Cath - 02/02/23  CPAP - denies  DM- denies  Blood Thinner Instructions: Hold Plavix  5 days. Last dose 5/15. Aspirin  Instructions: pt states she was told starting 5/18. Last dose 5/18. Farxiga - pt was not told to hold. Last dose 5/19.  ERAS Protcol - no, NPO  COVID TEST- n/a  Anesthesia review: yes, cardiac hx  Patient verbally denies any shortness of breath, fever, cough and chest pain during phone call      Questions were answered. Patient verbalized understanding of instructions.

## 2023-11-09 NOTE — Telephone Encounter (Signed)
 Ok. Done

## 2023-11-09 NOTE — Telephone Encounter (Signed)
 Madison West with [facility] called in stating pt is having surgery tomorrow and they would like to be able to see her most recent EKG from 09/06/23, however it has not been released, she asked if someone could release it. Please advise.

## 2023-11-09 NOTE — Telephone Encounter (Signed)
 I don't remember if she had a EKG that day or not. I see the order, but it looks like it never crossed over.

## 2023-11-09 NOTE — Pre-Procedure Instructions (Signed)
-------------    SDW INSTRUCTIONS given:  Your procedure is scheduled on 5/21.  Report to [facility] Main Entrance "A" at 06:30 A.M., and check in at the Admitting office.  Any questions or running late day of surgery: call [phone]    Remember:  Do not eat or drink after midnight the night before your surgery     Take these medicines the morning of surgery with A SIP OF WATER  buspar , robaxin , rosuvastatin              May take these medicines IF NEEDED: tylenol , xanax , roxicodone   nitroglycerin - If you have to take this medication prior to surgery, please call [phone] and report this to a nurse    STOP taking Plavix  5 days prior to surgery. Last dose 5/15.   STOP taking Farxiga  3 days prior to surgery. Last dose 5/17.  Follow your surgeon's instructions on when to stop Aspirin .  If no instructions were given by your surgeon then you will need to call the office to get those instructions.    As of today, STOP taking any Aleve, Naproxen, Ibuprofen , Motrin , Advil , Goody's, BC's, all herbal medications, fish oil, and all vitamins.   Do NOT Smoke (Tobacco/Vaping) 24 hours prior to your procedure  If you use a CPAP at night, you may bring all equipment for your overnight stay.     You will be asked to remove any contacts, glasses, piercing's, hearing aid's, dentures/partials prior to surgery. Please bring cases for these items if needed.     Patients discharged the day of surgery will not be allowed to drive home, and someone needs to stay with them for 24 hours.  SURGICAL WAITING ROOM VISITATION Patients may have no more than 2 support people in the waiting area - these visitors may rotate.   Pre-op nurse will coordinate an appropriate time for 1 ADULT support person, who may not rotate, to accompany patient in pre-op.  Children under the age of 69 must have an adult with them who is not the patient and must remain in the main waiting area with an adult.  If the  patient needs to stay at the hospital during part of their recovery, the visitor guidelines for inpatient rooms apply.  Please refer to the [facility] website for the visitor guidelines for any additional information.   Special instructions:   [facility]- Preparing For Surgery   Please follow these instructions carefully.   Shower the NIGHT BEFORE SURGERY and the MORNING OF SURGERY with DIAL Soap.   Pat yourself dry with a CLEAN TOWEL.  Wear CLEAN PAJAMAS to bed the night before surgery  Place CLEAN SHEETS on your bed the night of your first shower and DO NOT SLEEP WITH PETS.   Additional instructions for the day of surgery: DO NOT APPLY any lotions, deodorants, cologne, or perfumes.   Do not wear jewelry or makeup Do not wear nail polish, gel polish, artificial nails, or any other type of covering on natural nails (fingers and toes) Do not bring valuables to the hospital. [facility] is not responsible for valuables/personal belongings. Put on clean/comfortable clothes.  Please brush your teeth.  Ask your nurse before applying any prescription medications to the skin.

## 2023-11-09 NOTE — Telephone Encounter (Signed)
 Copied from CRM [id number]. Topic: Clinical - Request for Lab/Test Order >> Nov 09, 2023  9:31 AM Juliana Ocean wrote: Reason for CRM: Deborrah Fam w/ [facility] states pt is having surgery tomorrow.  They do not have any surgical orders from Dr Waylan Haggard.  Please put the orders in asap. Any questions call Deborrah Fam at [phone]

## 2023-11-09 NOTE — Telephone Encounter (Signed)
 Called and discussed with patient.  Reassured her that the ANA level is borderline positive and nonspecific.  Nothing further needed.

## 2023-11-09 NOTE — Progress Notes (Signed)
 Anesthesia Chart Review: SAME DAY WORK-UP  Case: [id number] Date/Time: 11/10/23 0830   Procedures:      VIDEO BRONCHOSCOPY WITHOUT FLUORO (Bilateral)     BRONCHOSCOPY, WITH EBUS   Anesthesia type: General   Diagnosis: Hemoptysis [R04.2]   Pre-op diagnosis: Hemoptysis   [facility]   Surgeons: Mannam, Praveen, MD       DISCUSSION: Patient is a 68 year old female scheduled for the above procedure. She had prolonged hospitalization 01/27/23-02/18/23, followed by [facility] until 03/05/23 for chest pain and acute HFrEF in setting of severe LAD stenosis and severe AS. S/p DES LAD with acute VT arrest 2 days later due to acute stent thrombosis, s/p aspiration thrombectomy. She required placement of IABP for refractory VT and cardiogenic shock on 02/02/23, and underwent TAVR on 02/04/23. She developed a right internal jugular DVT and was discharged on Plavix  and Eliquis . She developed mild persistent hemoptysis since, and Eliquis  discontinued in March 2025. High resolution chest CT in May 2025 suggested nonfibrotic hypersensitivity pneumonitis The above procedure was recommended for further evaluation. Dr. Waylan Haggard did reach out to her cardiologist Dr. Filiberto Hug who wrote on 10/25/23, "History of PCI followed by stent thrombosis, requiring repeat procedure and thrombus aspiration in 01/2023. Now. Patient has hypoxia with concerns for hemoptysis. Bronchoscopy evaluation recommended by pulmonology under general anesthesia.    Ideally, she would need at least 1 year of DAPT (till 01/2024). However, given her hypoxia and need for bronchoscopy evaluation, we could consider holding Plavix  for the minimum possible duration (no more than 3-5 days) and continue Aspirin  periprocedural period. Resume Plavix  as soon as possible after bronchoscopy evaluation and no contraindication found.   Fairly stable form, HFrEF standpoint. Moderate perioperative cardiac risk, not prohibitive, Okay to proceed.",      History includes former smoker (quit 1998), HTN, HLD, CAD (Ramus Int DES x2 02/27/20; PTCA/DES mLAD 01/29/23, s/p aspiration thrombectomy/PTCA mLAD for STEMI/acute thrombosis with pulseless VT s/p defib/2 min CPR 01/31/23, intubation/IABP placement 02/02/23 for refractory VT with cardiogenic shock in setting of severe AS/ischemic cardiomyopathy), aortic stenosis (s/p 26 mm Edwards Sapian 3 Ultra TAVR 02/04/23), HFrEF (diagnosed 01/2023), PSVT (s/p adenosine  08/04/16), carotid artery stenosis (s/p right CEA 06/09/19), DVT (left internal jugular DVT 02/10/23, s/p 6 months Eliquis ), right breast cancer (s/p right breast lumpectomy 07/04/07, right axillary node dissection 08/05/07, s/p radiation 2009; had recurrent breast cellulitis with ID evaluation 09/2011), vulvar squamous cell carcinoma (stage 1B, s/p multiple procedures at [facility], including partial radical vulvectomy, bilateral groin sentinel lymph node sampling 06/21/18; radical excision of vulva 04/26/18; colposcopy with cervical and vulvar biopsies 03/01/18 & 08/27/15; anterior vulvar excision and right medial thigh lesion excision 10/22/15; excision of right vulvar dysplasia 11/22/12; hysteroscopy, D&D, wide local excision of vulvar dysplasia 08/15/11), anxiety (including phobia/hesitancy to take medications).   Anesthesia Complication History: Following her 08/15/11 hysteroscopy at [facility] under GETA she developed transient 3rd degree heart block (rate 30-40's) in PACU. Labs/lytes unremarkable. Cardiologist Emerson Hanly, MD consulted. Basline EKG with 1st degree AV block. Mobitz 1 2nd degree AV block with occasional junctional escape of follow-up EKG. He thought neostigmine may have resulted in AV nodal block and advised to avoid AV nodal blocking agents but did not feel there was any indication for PPM at that time. She also feels she became "extra hyper" after receiving Fentanyl  and Versed .    Last office visit with Dr. Filiberto Hug was on 09/06/23. Given history of  stent thrombosis, he recommended lifelong  p2y12 therapy. He had her continue amiodarone  for 6 months post VT arrest. She had completed six months of Eliquis  for provoked DVT, so this was discontinued given timeframe and on-going mild hemoptysis followed by pulmonology.     She had Pharmacy [facility] visit on 11/03/23 with Modesta Andrea, RPH-CPP.Aaron Aas She is very anxious about taking new medications. BP not allowing for much titration of medications. Continue losartan , Farxiga , spironolactone . She did not want to make any changes prior to bronchoscopy. They will re-discuss starting b-blocker at her next follow-up.   Last Plavix  11/04/23.  She did not receive Farxiga  instructions until 11/08/23, so that was her last dose.  She had stopped ASA after 11/07/23 dose. Sine both Dr. Cline Dan and Dr. Isabel Many notes specify holding Plavix , I did reach out to Dr. Waylan Haggard who asked her to take her usual dose on 11/09/23.   Anesthesia team to evaluate on the day of surgery.      VS: Ht 5\' 8"  (1.727 m)   Wt 88 kg   BMI 29.50 kg/m  Wt Readings from Last 3 Encounters:  11/01/23 89.7 kg  10/25/23 88.8 kg  09/30/23 88.7 kg   Temp Readings from Last 3 Encounters:  09/08/23 36.4 C (Oral)  05/16/23 37.2 C  03/05/23 36.9 C (Oral)   BP Readings from Last 3 Encounters:  11/03/23 104/76  10/25/23 120/64  09/30/23 110/79   Pulse Readings from Last 3 Encounters:  11/03/23 77  10/25/23 82  09/30/23 79     PROVIDERS: Sallye Crease, NP is PCP  - Fransico Ivy, MD Knox Perl, MD is cardiologist - Randie Bustle, MD is GYN-ONC. Last visit 07/24/22 with one year follow-up planned.  - She has seen multiple pulmonologist with [facility], last Mannam, Praveen, MD.   LABS: For day of surgery as indicated. Most recent results in [facility] include: Lab Results  Component Value Date   WBC 5.8 11/03/2023   HGB 12.2 11/03/2023   HCT 37.7 11/03/2023   PLT 386.0 11/03/2023   GLUCOSE 75 11/03/2023   CHOL  143 09/02/2023   TRIG 99 09/02/2023   HDL 54 09/02/2023   LDLCALC 71 09/02/2023   ALT 19 11/03/2023   AST 31 11/03/2023   NA 132 (L) 11/03/2023   K 4.2 11/03/2023   CL 99 11/03/2023   CREATININE 0.87 11/03/2023   BUN 19 11/03/2023   CO2 23 11/03/2023   TSH 6.32 (H) 09/08/2023   INR 1.0 11/03/2023   HGBA1C 5.6 02/01/2023     IMAGES: CT Chest High Resolution 10/28/2023: IMPRESSION: 1. Pulmonary parenchymal pattern of interstitial lung disease and air trapping is suggestive of nonfibrotic hypersensitivity pneumonitis. Findings are suggestive of an alternative diagnosis (not UIP) per consensus guidelines: Diagnosis of Idiopathic Pulmonary Fibrosis: An Official ATS/ERS/JRS/ALAT Clinical Practice Guideline. Am Annie Barton Crit Care Med Vol 198, Iss 5, [phone], Feb 20 2017. 2. Small to borderline enlarged mediastinal lymph nodes, stable and likely reactive in etiology. 3. Aortic atherosclerosis (ICD10-I70.0). Coronary artery calcification.   CTA Head/Neck 05/16/2023: IMPRESSION: 1. No acute intracranial process. 2. No intracranial large vessel occlusion or significant stenosis. 3. No hemodynamically significant stenosis in the neck. 4. Apical periodontitis of the roots of the right first maxillary molar, with dehiscence of the floor of the right maxillary sinus and right maxillary mucosal thickening, consistent with odontogenic sinusitis.    EKG: 03/17/2023: Sinus rhythm with 1st degree A-V block Left axis deviation Minimal voltage criteria for LVH, may be normal variant ( Cornell product )  Inferior infarct , age undetermined Confirmed by Abagail Hoar [phone]) on 03/17/2023 4:07:21 PM   CV: Echo 07/07/2023: IMPRESSIONS  1. Left ventricular ejection fraction, by estimation, is 35 to 40%. The  left ventricle has moderately decreased function. The left ventricle  demonstrates regional wall motion abnormalities (see scoring  diagram/findings for description). Elevated left   ventricular end-diastolic pressure.   2. Right ventricular systolic function is normal. The right ventricular  size is normal. Tricuspid regurgitation signal is inadequate for assessing  PA pressure.   3. The mitral valve is normal in structure. Trivial mitral valve  regurgitation. No evidence of mitral stenosis. Moderate mitral annular  calcification.   4. Well-seated bioprosthetic valve with normal function. The aortic valve  is normal in structure. Aortic valve regurgitation is not visualized. No  aortic stenosis is present. There is a 26 mm Sapien prosthetic (TAVR)  valve present in the aortic  position.   5. The inferior vena cava is normal in size with greater than 50%  respiratory variability, suggesting right atrial pressure of 3 mmHg.     UE Venous Duplex 02/17/2023: Summary:  Right:  No evidence of thrombosis in the subclavian.  Left:  No evidence of deep vein thrombosis in the upper extremity. Findings  consistent  with age indeterminate superficial vein thrombosis involving the left  cephalic  vein- essentially unchanged as compared to previous examination.  Previously  visualized internal jugular vein DVT appears resolved on today's  examination.    Right and left heart catheterization 02/02/2023: LM: Normal LAD: Patent mid LAD stent with no restenosis.stent thrombosis Lcx: Mid diffuse 50% disease  Ramus: Patent stent. No restenosis RCA: Mid eccentric 40% disease. Rest mild diffuse disease   RA: 21 mmHg PA: 50/29 mmHg, mPAP 38 mmHg PCW: 29 mmHg   LV: 127/12 mmHg Ao: 102/77 mmHg CO: 5.5 L/min CI: 2.7 L/min/m2   Swan catheter left in place. IV lasix  20X2 administered in cath lab. Continue IV amiodarone . Needs aggressive HFrEF management. I had a long conversation with patient and family regarding importance of HFrEF management. Will get heart failure team consult.    Aggressive GDMT for HFrEF   Carotid artery duplex 05/25/2022: There is a patent  endarterectomy site starting at the right carotid ICA-prox extending into the ICA-mid. Duplex suggests stenosis in the right internal carotid artery (minimal). Duplex suggests stenosis in the left internal carotid artery (minimal). Antegrade right vertebral artery flow. Antegrade left vertebral artery flow. No significant change from 08/13/2020. Follow up study if clinically Indicated.   Event monitor 30 days 03/11/2018: Episodic transmission 03/20/2018 1:30 pm: 5 beat NSVT.One brief atrial tach @ 142/min. Frequent PAC in bigeminal pattern, symptomatic. Rare PVC.      Past Medical History:  Diagnosis Date   Anxiety    Aortic stenosis    s/p TAVR 02/04/23   Arthritis    low back and hip pain intermittent   Breast cancer (HCC) 06/02/2007   r breast -surgery ,radiaology. chemotherapy   Carotid artery disease (HCC)    Carpal tunnel syndrome    right hand   CHF (congestive heart failure) (HCC)    Colon polyps    Complication of anesthesia    Fentanyl , Versed -makes extra hyper, bradycardia x 1 in PACU, [facility] (08/15/11 cardiology felt neostigmine may have resulted in AV nodal block)    Coronary artery disease    Depression    denies   DVT (deep venous thrombosis) (HCC)    provoked left IJ 02/10/23  Dysplasia of vulva    Hypertension    Palpitations    PSVT, s/p adenosine  08/04/16   S/P breast lumpectomy 07/04/2007   R breast   S/P radiation therapy 2009   Vulva cancer [facility])     Past Surgical History:  Procedure Laterality Date   APPENDECTOMY     age 50   CARDIAC CATHETERIZATION     CESAREAN SECTION     x 2   COLONOSCOPY W/ POLYPECTOMY     COLONOSCOPY WITH PROPOFOL  N/A 04/27/2016   Procedure: COLONOSCOPY WITH PROPOFOL ;  Surgeon: Garrett Kallman, MD;  Location: [facility];  Service: Endoscopy;  Laterality: N/A;   CORONARY STENT INTERVENTION N/A 01/29/2023   Procedure: CORONARY STENT INTERVENTION;  Surgeon: Cody Das, MD;  [facility] CV LAB;  Service:  Cardiovascular;  Laterality: N/A;   CORONARY THROMBECTOMY N/A 01/31/2023   Procedure: Coronary Thrombectomy;  Surgeon: Cody Das, MD;  [facility];  Service: Cardiovascular;  Laterality: N/A;   CORONARY ULTRASOUND/IVUS N/A 01/31/2023   Procedure: Coronary Ultrasound/IVUS;  Surgeon: Cody Das, MD;  [facility];  Service: Cardiovascular;  Laterality: N/A;   CORONARY/GRAFT ACUTE MI REVASCULARIZATION N/A 01/31/2023   Procedure: Coronary/Graft Acute MI Revascularization;  Surgeon: Cody Das, MD;  [facility];  Service: Cardiovascular;  Laterality: N/A;   DILATION AND CURETTAGE OF UTERUS     multiple   ENDARTERECTOMY Right 06/09/2019   ENDARTERECTOMY Right 06/09/2019   Procedure: ENDARTERECTOMY CAROTID RIGHT;  Surgeon: Richrd Char, MD;  Location: [facility] OR;  Service: Vascular;  Laterality: Right;   IABP INSERTION N/A 02/02/2023   Procedure: IABP Insertion;  Surgeon: Cody Das, MD;  [facility];  Service: Cardiovascular;  Laterality: N/A;   INTRAUTERINE DEVICE INSERTION     IUD REMOVAL     LEFT HEART CATH AND CORONARY ANGIOGRAPHY N/A 02/27/2020   Procedure: LEFT HEART CATH AND CORONARY ANGIOGRAPHY;  Surgeon: Knox Perl, MD;  [facility];  Service: Cardiovascular;  Laterality: N/A;   LEFT HEART CATH AND CORONARY ANGIOGRAPHY N/A 01/31/2023   Procedure: LEFT HEART CATH AND CORONARY ANGIOGRAPHY;  Surgeon: Cody Das, MD;  [facility];  Service: Cardiovascular;  Laterality: N/A;   MASTECTOMY PARTIAL / LUMPECTOMY W/ AXILLARY LYMPHADENECTOMY Right    lumpectomy and lymph nodes removed   PATCH ANGIOPLASTY Right 06/09/2019   Procedure: Patch Angioplasty of right carotid artery using hemashield paltinum finesse patch;  Surgeon: Richrd Char, MD;  Location: [facility];  Service: Vascular;  Laterality: Right;   RIGHT AND LEFT HEART CATH N/A 02/01/2023    Procedure: RIGHT AND LEFT HEART CATH;  Surgeon: Cody Das, MD;  [facility];  Service: Cardiovascular;  Laterality: N/A;   RIGHT HEART CATH N/A 02/02/2023   Procedure: RIGHT HEART CATH;  Surgeon: Mardell Shade, MD;  [facility];  Service: Cardiovascular;  Laterality: N/A;   RIGHT/LEFT HEART CATH AND CORONARY ANGIOGRAPHY N/A 01/27/2023   Procedure: RIGHT/LEFT HEART CATH AND CORONARY ANGIOGRAPHY;  Surgeon: Cody Das, MD;  [facility];  Service: Cardiovascular;  Laterality: N/A;   TEE WITHOUT CARDIOVERSION N/A 02/04/2023   Procedure: TRANSESOPHAGEAL ECHOCARDIOGRAM;  Surgeon: Kyra Phy, MD;  Location: [facility] INVASIVE CV LAB;  Service: Open Heart Surgery;  Laterality: N/A;   TEMPORARY PACEMAKER N/A 02/02/2023   Procedure: TEMPORARY PACEMAKER;  Surgeon: Mardell Shade, MD;  [facility];  Service: Cardiovascular;  Laterality: N/A;   TRANSCATHETER AORTIC VALVE REPLACEMENT, TRANSFEMORAL N/A 02/04/2023   Procedure: Transcatheter Aortic Valve Replacement, Transfemoral;  Surgeon: Thukkani, Arun K, MD;  [facility];  Service: Open Heart Surgery;  Laterality: N/A;   TUBAL LIGATION     VULVA SURGERY     Multiple times for dysplasia    MEDICATIONS: No current facility-administered medications for this encounter.    acetaminophen  (TYLENOL ) 325 MG tablet   alprazolam  (XANAX ) 2 MG tablet   aspirin  EC 81 MG tablet   busPIRone  (BUSPAR ) 10 MG tablet   clopidogrel  (PLAVIX ) 75 MG tablet   dapagliflozin  propanediol (FARXIGA ) 10 MG TABS tablet   ezetimibe  (ZETIA ) 10 MG tablet   furosemide  (LASIX ) 20 MG tablet   losartan  (COZAAR ) 25 MG tablet   methocarbamol  (ROBAXIN ) 500 MG tablet   nitroGLYCERIN  (NITROSTAT ) 0.4 MG SL tablet   oxyCODONE  (ROXICODONE ) 5 MG immediate release tablet   rosuvastatin  (CRESTOR ) 20 MG tablet   spironolactone  (ALDACTONE ) 25 MG tablet     Ella Gun,  PA-C Surgical Short Stay/Anesthesiology [facility] Phone [phone] [facility] Phone [phone] 11/09/2023 3:22 PM

## 2023-11-10 ENCOUNTER — Encounter: Payer: Self-pay | Admitting: Pulmonary Disease

## 2023-11-10 ENCOUNTER — Encounter (HOSPITAL_COMMUNITY): Payer: Self-pay | Admitting: Pulmonary Disease

## 2023-11-10 ENCOUNTER — Ambulatory Visit (HOSPITAL_BASED_OUTPATIENT_CLINIC_OR_DEPARTMENT_OTHER): Admitting: Vascular Surgery

## 2023-11-10 ENCOUNTER — Ambulatory Visit (HOSPITAL_COMMUNITY)
Admission: RE | Admit: 2023-11-10 | Discharge: 2023-11-10 | Disposition: A | Discharge status: Home / Self Care | Attending: Pulmonary Disease | Admitting: Pulmonary Disease

## 2023-11-10 ENCOUNTER — Other Ambulatory Visit: Payer: Self-pay

## 2023-11-10 ENCOUNTER — Ambulatory Visit (HOSPITAL_COMMUNITY): Admitting: Vascular Surgery

## 2023-11-10 ENCOUNTER — Encounter (HOSPITAL_COMMUNITY)
Admission: RE | Disposition: A | Discharge status: Home / Self Care | Payer: Self-pay | Source: Home / Self Care | Attending: Pulmonary Disease

## 2023-11-10 ENCOUNTER — Ambulatory Visit (HOSPITAL_COMMUNITY)

## 2023-11-10 DIAGNOSIS — M1990 Unspecified osteoarthritis, unspecified site: Secondary | ICD-10-CM | POA: Diagnosis not present

## 2023-11-10 DIAGNOSIS — Z7982 Long term (current) use of aspirin: Secondary | ICD-10-CM | POA: Insufficient documentation

## 2023-11-10 DIAGNOSIS — Z952 Presence of prosthetic heart valve: Secondary | ICD-10-CM | POA: Diagnosis not present

## 2023-11-10 DIAGNOSIS — Z79899 Other long term (current) drug therapy: Secondary | ICD-10-CM | POA: Insufficient documentation

## 2023-11-10 DIAGNOSIS — F419 Anxiety disorder, unspecified: Secondary | ICD-10-CM | POA: Insufficient documentation

## 2023-11-10 DIAGNOSIS — Z8544 Personal history of malignant neoplasm of other female genital organs: Secondary | ICD-10-CM | POA: Insufficient documentation

## 2023-11-10 DIAGNOSIS — Z853 Personal history of malignant neoplasm of breast: Secondary | ICD-10-CM | POA: Diagnosis not present

## 2023-11-10 DIAGNOSIS — R011 Cardiac murmur, unspecified: Secondary | ICD-10-CM | POA: Diagnosis not present

## 2023-11-10 DIAGNOSIS — Z955 Presence of coronary angioplasty implant and graft: Secondary | ICD-10-CM | POA: Insufficient documentation

## 2023-11-10 DIAGNOSIS — I509 Heart failure, unspecified: Secondary | ICD-10-CM | POA: Insufficient documentation

## 2023-11-10 DIAGNOSIS — I471 Supraventricular tachycardia, unspecified: Secondary | ICD-10-CM | POA: Diagnosis not present

## 2023-11-10 DIAGNOSIS — I110 Hypertensive heart disease with heart failure: Secondary | ICD-10-CM | POA: Insufficient documentation

## 2023-11-10 DIAGNOSIS — R042 Hemoptysis: Secondary | ICD-10-CM | POA: Insufficient documentation

## 2023-11-10 DIAGNOSIS — Z923 Personal history of irradiation: Secondary | ICD-10-CM | POA: Insufficient documentation

## 2023-11-10 DIAGNOSIS — I5023 Acute on chronic systolic (congestive) heart failure: Secondary | ICD-10-CM

## 2023-11-10 DIAGNOSIS — Z7902 Long term (current) use of antithrombotics/antiplatelets: Secondary | ICD-10-CM | POA: Diagnosis not present

## 2023-11-10 DIAGNOSIS — Z87891 Personal history of nicotine dependence: Secondary | ICD-10-CM | POA: Diagnosis not present

## 2023-11-10 DIAGNOSIS — I739 Peripheral vascular disease, unspecified: Secondary | ICD-10-CM | POA: Diagnosis not present

## 2023-11-10 DIAGNOSIS — Z8249 Family history of ischemic heart disease and other diseases of the circulatory system: Secondary | ICD-10-CM | POA: Insufficient documentation

## 2023-11-10 DIAGNOSIS — I2510 Atherosclerotic heart disease of native coronary artery without angina pectoris: Secondary | ICD-10-CM | POA: Diagnosis not present

## 2023-11-10 DIAGNOSIS — Z86718 Personal history of other venous thrombosis and embolism: Secondary | ICD-10-CM | POA: Insufficient documentation

## 2023-11-10 DIAGNOSIS — I252 Old myocardial infarction: Secondary | ICD-10-CM | POA: Diagnosis not present

## 2023-11-10 DIAGNOSIS — R0609 Other forms of dyspnea: Secondary | ICD-10-CM

## 2023-11-10 HISTORY — DX: Nonrheumatic aortic (valve) stenosis: I35.0

## 2023-11-10 HISTORY — DX: Disorder of arteries and arterioles, unspecified: I77.9

## 2023-11-10 HISTORY — DX: Acute embolism and thrombosis of unspecified deep veins of unspecified lower extremity: I82.409

## 2023-11-10 HISTORY — PX: VIDEO BRONCHOSCOPY WITH ENDOBRONCHIAL ULTRASOUND: SHX6177

## 2023-11-10 HISTORY — DX: Malignant neoplasm of vulva, unspecified: C51.9

## 2023-11-10 HISTORY — DX: Heart failure, unspecified: I50.9

## 2023-11-10 HISTORY — PX: VIDEO BRONCHOSCOPY: SHX5072

## 2023-11-10 LAB — BODY FLUID CELL COUNT WITH DIFFERENTIAL
Eos, Fluid: 1 %
Eos, Fluid: 2 %
Lymphs, Fluid: 16 %
Lymphs, Fluid: 5 %
Monocyte-Macrophage-Serous Fluid: 74 % (ref 50–90)
Monocyte-Macrophage-Serous Fluid: 74 % (ref 50–90)
Neutrophil Count, Fluid: 8 % (ref 0–25)
Neutrophil Count, Fluid: 19 % (ref 0–25)
Other Cells, Fluid: 1 %
Total Nucleated Cell Count, Fluid: 113 uL (ref 0–1000)
Total Nucleated Cell Count, Fluid: 207 uL (ref 0–1000)

## 2023-11-10 SURGERY — VIDEO BRONCHOSCOPY WITHOUT FLUORO
Anesthesia: General

## 2023-11-10 MED ORDER — ROCURONIUM BROMIDE 100 MG/10ML IV SOLN
INTRAVENOUS | Status: DC | PRN
  Administered 2023-11-10: 50 mg via INTRAVENOUS

## 2023-11-10 MED ORDER — FENTANYL CITRATE (PF) 100 MCG/2ML IJ SOLN
INTRAMUSCULAR | Status: DC | PRN
  Administered 2023-11-10: 50 ug via INTRAVENOUS

## 2023-11-10 MED ORDER — LACTATED RINGERS IV SOLN: INTRAVENOUS | Status: DC | PRN

## 2023-11-10 MED ORDER — FENTANYL CITRATE (PF) 100 MCG/2ML IJ SOLN
INTRAMUSCULAR | Status: AC
  Filled 2023-11-10: qty 2

## 2023-11-10 MED ORDER — PROPOFOL 500 MG/50ML IV EMUL
INTRAVENOUS | Status: DC | PRN
  Administered 2023-11-10: 150 ug/kg/min via INTRAVENOUS

## 2023-11-10 MED ORDER — SUGAMMADEX SODIUM 200 MG/2ML IV SOLN
INTRAVENOUS | Status: DC | PRN
  Administered 2023-11-10: 200 mg via INTRAVENOUS

## 2023-11-10 MED ORDER — PHENYLEPHRINE HCL (PRESSORS) 10 MG/ML IV SOLN
INTRAVENOUS | Status: DC | PRN
Start: 2023-11-10 — End: 2023-11-10
  Administered 2023-11-10: 180 ug via INTRAVENOUS

## 2023-11-10 MED ORDER — PHENYLEPHRINE HCL-NACL 20-0.9 MG/250ML-% IV SOLN
INTRAVENOUS | Status: DC | PRN
  Administered 2023-11-10: 40 ug/min via INTRAVENOUS

## 2023-11-10 MED ORDER — MIDAZOLAM HCL 2 MG/2ML IJ SOLN
INTRAMUSCULAR | Status: AC
Start: 2023-11-10 — End: ?
  Filled 2023-11-10: qty 2

## 2023-11-10 MED ORDER — ACETAMINOPHEN 500 MG PO TABS
ORAL_TABLET | ORAL | Status: AC
  Filled 2023-11-10: qty 2

## 2023-11-10 MED ORDER — ONDANSETRON HCL 4 MG/2ML IJ SOLN
INTRAMUSCULAR | Status: DC | PRN
  Administered 2023-11-10: 4 mg via INTRAVENOUS

## 2023-11-10 MED ORDER — CHLORHEXIDINE GLUCONATE 0.12 % MT SOLN
OROMUCOSAL | Status: AC
  Filled 2023-11-10: qty 15

## 2023-11-10 MED ORDER — MIDAZOLAM HCL 5 MG/5ML IJ SOLN
INTRAMUSCULAR | Status: DC | PRN
Start: 2023-11-10 — End: 2023-11-10
  Administered 2023-11-10: 2 mg via INTRAVENOUS

## 2023-11-10 MED ORDER — ACETAMINOPHEN 500 MG PO TABS
1000.0000 mg | ORAL_TABLET | Freq: Once | ORAL | Status: AC
  Administered 2023-11-10: 1000 mg via ORAL

## 2023-11-10 MED ORDER — DEXAMETHASONE SODIUM PHOSPHATE 10 MG/ML IJ SOLN
INTRAMUSCULAR | Status: DC | PRN
  Administered 2023-11-10: 10 mg via INTRAVENOUS

## 2023-11-10 MED ORDER — LIDOCAINE HCL (CARDIAC) PF 100 MG/5ML IV SOSY
PREFILLED_SYRINGE | INTRAVENOUS | Status: DC | PRN
Start: 2023-11-10 — End: 2023-11-10
  Administered 2023-11-10: 60 mg via INTRAVENOUS

## 2023-11-10 MED ORDER — EPHEDRINE SULFATE (PRESSORS) 50 MG/ML IJ SOLN
INTRAMUSCULAR | Status: DC | PRN
Start: 2023-11-10 — End: 2023-11-10
  Administered 2023-11-10: 5 mg via INTRAVENOUS

## 2023-11-10 MED ORDER — CHLORHEXIDINE GLUCONATE 0.12 % MT SOLN
15.0000 mL | Freq: Once | OROMUCOSAL | Status: AC
  Administered 2023-11-10: 15 mL via OROMUCOSAL

## 2023-11-10 MED ORDER — SODIUM CHLORIDE 0.9 % IV SOLN
INTRAVENOUS | Status: AC | PRN
  Administered 2023-11-10: 500 mL via INTRAVENOUS

## 2023-11-10 NOTE — Op Note (Signed)
 [facility] Cardiopulmonary Patient Name: Madison West Date: 11/10/2023 MRN: [id number] Attending MD: Phyllis Breeze , MD, [id number] Date of Birth: [birth year] CSN: Finalized Age: 68 Admit Type: Ambulatory Gender: Female Procedure:             Bronchoscopy Indications:           Hemoptysis with abnormal CXR Providers:             Phyllis Breeze, MD, Ila Malay, RN, Gabino Joe,                         Technician Referring MD:           Medicines:             General Anesthesia Complications:         No immediate complications Estimated Blood Loss:  Estimated blood loss: none. Procedure:             Pre-Anesthesia Assessment:                        - A History and Physical has been performed. Patient                         meds and allergies have been reviewed. The risks and                         benefits of the procedure and the sedation options and                         risks were discussed with the patient. All questions                         were answered and informed consent was obtained.                         Patient identification and proposed procedure were                         verified prior to the procedure by the physician in                         the pre-procedure area. Mental Status Examination:                         alert and oriented. Airway Examination: normal                         oropharyngeal airway. Respiratory Examination: clear                         to auscultation. CV Examination: RRR, no murmurs, no                         S3 or S4. ASA Grade Assessment: II - A patient with                         mild systemic disease. After reviewing the risks and  benefits, the patient was deemed in satisfactory                         condition to undergo the procedure. The anesthesia                         plan was to use general anesthesia. Immediately prior                         to  administration of medications, the patient was                         re-assessed for adequacy to receive sedatives. The                         heart rate, respiratory rate, oxygen saturations,                         blood pressure, adequacy of pulmonary ventilation, and                         response to care were monitored throughout the                         procedure. The physical status of the patient was                         re-assessed after the procedure.                        After obtaining informed consent, the bronchoscope was                         passed under direct vision. Throughout the procedure,                         the patient's blood pressure, pulse, and oxygen                         saturations were monitored continuously. the [id number]                         ([id number]) Olympus broncoscope was introduced through                         the mouth, via the endotracheal tube and advanced to                         the tracheobronchial tree of both lungs. the [id number]                         ( [id number] ) EBUS Scope was introduced through the and                         advanced to the. The patient tolerated the procedure                         well. Scope In: Scope Out: Findings:  Bilateral Lung Abnormalities:Small amounts of bloody mucus seen in       bilateral lungs mostly in the lower lobes that that was removed with       lavage. No active beleeding noted.      The bronchoscope was advanced until wedged at the desired location for       bronchoalveolar lavage. BAL was performed in the LUL inferior lingular       segment (B5) of the lung and sent for cell count, bacterial culture,       viral smears & culture, and fungal & AFB analysis and cytology. 180 mL       of fluid were instilled. 110 mL were returned. The return was       blood-tinged. There were no mucoid plugs in the return fluid. Multiple       specimens were obtained and pooled into one  specimen, which was sent for       analysis.      The bronchoscope was advanced until wedged at the desired location for       bronchoalveolar lavage. BAL was performed in the RML medial segment (B5)       of the lung and sent for cell count, bacterial culture, viral smears &       culture, and fungal & AFB analysis and cytology. 180 mL of fluid were       instilled. 110 mL were returned. The return was blood-tinged. There were       no mucoid plugs in the return fluid. Multiple specimens were obtained       and pooled into one specimen, which was sent for analysis. In both lobes       the serial aliquots were increasingly bloody indicating diffuse alveolar       hemorhage      Lymph Nodes: The 7 and 4 LN were boderline enlarged at around 1 cm.       Transbronchial needle aspiration of the subcarinal mediastinum (level 7)       was performed using a Wang 21 gauge needle and sent for routine       cytology. The procedure was guided by ultrasound. Three samples were       obtained. Impression:            - Hemoptysis with abnormal CXR                        - Bronchoalveolar lavage was performed.                        - Bronchoalveolar lavage was performed.                        - Biospy of station 7 lymph node was performed. Moderate Sedation:      GA Recommendation:        - Await BAL and biopsy results. Procedure Code(s):     --- Professional ---                        [phone], Bronchoscopy, rigid or flexible, including                         fluoroscopic guidance, when performed; with  endobronchial ultrasound (EBUS) guided transtracheal                         and/or transbronchial sampling (eg,                         aspiration[s]/biopsy[ies]), one or two mediastinal                         and/or hilar lymph node stations or structures                        [id number], Bronchoscopy, rigid or flexible, including                         fluoroscopic guidance,  when performed; with bronchial                         alveolar lavage Diagnosis Code(s):     --- Professional ---                        R04.2, Hemoptysis CPT copyright 2022 American Medical Association. All rights reserved. The codes documented in this report are preliminary and upon coder review may  be revised to meet current compliance requirements. Denali Becvar, MD Kelcey Wickstrom, MD 11/10/2023 11:45:57 AM Number of Addenda: 0

## 2023-11-10 NOTE — Progress Notes (Signed)
 Per Dr. Waylan Haggard via epic secure chat- order 3L O2.  Pt walked during admission.  Urgent order placed.

## 2023-11-10 NOTE — Progress Notes (Addendum)
 SATURATION QUALIFICATIONS: (This note is used to comply with regulatory documentation for home oxygen)  Patient Saturations on Room Air at Rest = 86%  Patient Saturations on Room Air while Ambulating = 79%  Patient Saturations on 3 Liters of oxygen while Ambulating = 94%  Please briefly explain why patient needs home oxygen: pt requiring oxygen to maintain o2 sat above 89% post procedure

## 2023-11-10 NOTE — H&P (Signed)
 Madison West    [id number]    01-04-1956  Primary Care Physician:White, Vila Grayer, NP  Referring Physician: No referring provider defined for this encounter.  Chief complaint: Follow-up or hemoptysis  HPI: 68 y.o. who  has a past medical history of Anxiety, Aortic stenosis, Arthritis, Breast cancer (HCC) (06/02/2007), Carotid artery disease (HCC), Carpal tunnel syndrome, CHF (congestive heart failure) (HCC), Colon polyps, Complication of anesthesia, Coronary artery disease, Depression, DVT (deep venous thrombosis) (HCC), Dysplasia of vulva, Hypertension, Palpitations, S/P breast lumpectomy (07/04/2007), S/P radiation therapy (2009), and Vulva cancer (HCC).  Discussed the use of AI scribe software for clinical note transcription with the patient, who gave verbal consent to proceed.  History of Present Illness Madison Leyh "[name]" is a 68 year old female with heart failure and aortic stenosis who presents with persistent hemoptysis.  She has been experiencing hemoptysis for nine months, characterized by coughing up burgundy-colored blood clots approximately three times a day. This began after liberation from the right due to right six-week hospitalization in August for heart failure, MI, severe aortic stenosis, during which she underwent aortic valve replacement (TAVR) and stent placement.  Her hospitalization was due to heart failure symptoms, including shortness of breath, fluid retention, and severe heartburn. Prior to this, she was treated for pneumonia with antibiotics multiple times. During the hospital stay, she experienced cardiac arrest and was treated with a balloon pump. She was discharged with a diagnosis of severe aortic stenosis status post TAVR and a reduced ejection fraction of 30-35%.  Also diagnosed with a left IJ clot secondary to central venous line for which she received over 6 months of anticoagulation therapy.  She follows with Dr. Filiberto Hug.  She is  currently on Plavix  and aspirin , having stopped Eliquis  and amiodarone  in March of 2025. No recent travel, exposure to mold, or use of feather pillows. She has not experienced recent acid reflux or indigestion, except when consuming raw onions.  She has a history of smoking, with a 15 pack-year history, but quit approximately four years ago. Her medical history also includes breast cancer treated in 2009 and a carotid artery procedure in 2020. Her family history is significant for colon cancer in her mother and a massive heart attack in her father.   Pets: Cats, dogs, horses.  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  Husband had a feather pillow around 2010.  No ongoing exposure Smoking history: 15-pack-year smoker.  Quit in 1998 Travel history: Originally from [place] .  No significant recent travel Relevant family history: No family history of lung disease  Interim history: Presents for bronchoscopy for hemoptysis evaluation Continues to have some cough with blood-tinged mucus.  No other issues noted Anxious about the procedure  Outpatient Encounter Medications as of 10/25/2023  Medication Sig   acetaminophen  (TYLENOL ) 325 MG tablet Take 2 tablets (650 mg total) by mouth every 6 (six) hours. (Patient taking differently: Take 500 mg by mouth every 6 (six) hours.)   alprazolam  (XANAX ) 2 MG tablet Take 0.25-2 mg by mouth See admin instructions. 1 mg (may take up to 2 mg) twice daily. May also take 0.25 mg during the day if needed for anxiety.   aspirin  EC 81 MG tablet Take 1 tablet (81 mg total) by mouth daily. Swallow whole.   busPIRone  (BUSPAR ) 10 MG tablet TAKE 1 TABLET BY MOUTH TWICE A DAY (Patient taking differently: Take 5  mg by mouth 2 (two) times daily.)   clopidogrel  (PLAVIX ) 75 MG tablet Take 1 tablet (75 mg total) by mouth daily.   dapagliflozin  propanediol (FARXIGA ) 10 MG TABS tablet Take 1 tablet (10 mg total) by  mouth daily before breakfast.   furosemide  (LASIX ) 20 MG tablet Take 0.5 tablets (10 mg total) by mouth daily as needed (for lower extremity swelling and weight gain).   methocarbamol  (ROBAXIN ) 500 MG tablet Take 1 tablet (500 mg total) by mouth 2 (two) times daily.   nitroGLYCERIN  (NITROSTAT ) 0.4 MG SL tablet Place 1 tablet (0.4 mg total) under the tongue every 5 (five) minutes as needed for chest pain.   oxyCODONE  (ROXICODONE ) 5 MG immediate release tablet Take 1 tablet (5 mg total) by mouth daily as needed for severe pain (pain score 7-10).   rosuvastatin  (CRESTOR ) 20 MG tablet Take 0.5 tablets (10 mg total) by mouth daily.   spironolactone  (ALDACTONE ) 25 MG tablet Take 0.5 tablets (12.5 mg total) by mouth daily.   ezetimibe  (ZETIA ) 10 MG tablet Take 1 tablet (10 mg total) by mouth daily. (Patient not taking: Reported on 09/08/2023)   losartan  (COZAAR ) 25 MG tablet Take 1 tablet (25 mg total) by mouth daily.   No facility-administered encounter medications on file as of 10/25/2023.    Physical Exam: Blood pressure 94/61, pulse 69, temperature (!) 97.5 F (36.4 C), temperature source Temporal, resp. rate 16, height 5\' 8"  (1.727 m), weight 86.6 kg, SpO2 92%. Gen:      No acute distress HEENT:  EOMI, sclera anicteric Neck:     No masses; no thyromegaly Lungs:    Clear to auscultation bilaterally; normal respiratory effort CV:         Regular rate and rhythm; no murmurs Abd:      + bowel sounds; soft, non-tender; no palpable masses, no distension Ext:    No edema; adequate peripheral perfusion Neuro: alert and oriented x 3 Psych: normal mood and affect   Data Reviewed: Imaging: CTA 10/16/2022-no pulm embolism, mild bronchial wall thickening, emphysema, coronary artery calcifications  CT high-resolution 10/28/2023-groundglass throughout the lung with scattered septal thickening, subpleural reticulation area.  Alternate pattern I have reviewed the images  personally.  PFTs:  Labs: Assessment & Plan Hemoptysis Chronic hemoptysis for nine months, with dark burgundy blood clots, post-hospitalization and extubation.  Bleeding persists after Eliquis  was stopped in March 2025.  Possible causes include anticoagulation with Plavix , residual intubation effects, or other pulmonary pathology. Differential includes bleeding from Plavix , airway lesions, or other pulmonary issues such as connective tissue disease though she does not have symptoms and autoimmune antibodies are not impressive.. Significant concern expressed about potential causes.  Scheduled for bronchoscopy with BAL and EBUS evaluation of lymph nodes today. Risk/benefit discussed in detail with patient including risk of bleeding, pneumothorax.  She has agreed to go ahead with procedure.  Recommendations: Bronchoscopy  Phyllis Breeze MD [facility] 11/10/2023, 8:31 AM  CC: No ref. provider found

## 2023-11-10 NOTE — Anesthesia Postprocedure Evaluation (Signed)
 Anesthesia Post Note  Patient: Madison West  Procedure(s) Performed: VIDEO BRONCHOSCOPY WITHOUT FLUORO (Bilateral) BRONCHOSCOPY, WITH EBUS     Patient location during evaluation: PACU Anesthesia Type: General Level of consciousness: awake and alert Pain management: pain level controlled Vital Signs Assessment: post-procedure vital signs reviewed and stable Respiratory status: spontaneous breathing, nonlabored ventilation, respiratory function stable and patient connected to nasal cannula oxygen Cardiovascular status: blood pressure returned to baseline and stable Postop Assessment: no apparent nausea or vomiting Anesthetic complications: no Comments: Unable to wean off supplemental O2 and maintain adequate saturations. Dr. Waylan Haggard ordering for home oxygen   There were no known notable events for this encounter.  Last Vitals:  Vitals:   11/10/23 1140 11/10/23 1210  BP: 107/77 108/75  Pulse: 74 71  Resp: (!) 23 20  Temp:    SpO2: 98% 91%    Last Pain:  Vitals:   11/10/23 1110  TempSrc:   PainSc: 3                  Juventino Oppenheim

## 2023-11-10 NOTE — Anesthesia Procedure Notes (Signed)
 Procedure Name: Intubation Date/Time: 11/10/2023 8:36 AM  Performed by: Loreda Rodriguez, CRNAPre-anesthesia Checklist: Patient identified, Emergency Drugs available, Suction available and Patient being monitored Patient Re-evaluated:Patient Re-evaluated prior to induction Oxygen Delivery Method: Circle System Utilized Preoxygenation: Pre-oxygenation with 100% oxygen Induction Type: IV induction Ventilation: Mask ventilation without difficulty Laryngoscope Size: Mac and 3 Grade View: Grade I Tube type: Oral Tube size: 8.5 mm Number of attempts: 1 Airway Equipment and Method: Stylet and Oral airway Placement Confirmation: ETT inserted through vocal cords under direct vision, positive ETCO2 and breath sounds checked- equal and bilateral Secured at: 20 cm Tube secured with: Tape Dental Injury: Teeth and Oropharynx as per pre-operative assessment

## 2023-11-10 NOTE — Telephone Encounter (Signed)
 If you need to do an urgent EKG today for preop reasons, I am okay with this.  What surgery is the patient having?  Thanks MJP

## 2023-11-10 NOTE — Progress Notes (Signed)
 Per MD Mannam pt okay for discharge with home oxygen. Home O2 was delivered and in pt possession at discharge. Additional O2 to be delivered to pt's home.

## 2023-11-10 NOTE — Transfer of Care (Signed)
 Immediate Anesthesia Transfer of Care Note  Patient: Madison West  Procedure(s) Performed: VIDEO BRONCHOSCOPY WITHOUT FLUORO (Bilateral) BRONCHOSCOPY, WITH EBUS  Patient Location: Endoscopy Unit  Anesthesia Type:General  Level of Consciousness: awake, alert , and oriented  Airway & Oxygen Therapy: Patient Spontanous Breathing and Patient connected to nasal cannula oxygen  Post-op Assessment: Report given to RN and Post -op Vital signs reviewed and stable  Post vital signs: Reviewed and stable  Last Vitals:  Vitals Value Taken Time  BP 96/60 11/10/23 0934  Temp 36.1 C 11/10/23 0931  Pulse 79 11/10/23 0939  Resp 23 11/10/23 0939  SpO2 99 % 11/10/23 0939  Vitals shown include unfiled device data.  Last Pain:  Vitals:   11/10/23 0934  TempSrc:   PainSc: 0-No pain         Complications: No notable events documented.

## 2023-11-10 NOTE — Discharge Planning (Signed)
.  Joanette Moynahan, BSN, RN, [place] [phone] Pt qualifies for DME [facility]) oxygen.  DME  ordered through [employer].  Marlou Sims of [employer] notified to deliver DME to pt room prior to D/C home.

## 2023-11-10 NOTE — Telephone Encounter (Signed)
 Spoke to [place] and they will use the EKG from November 2024.

## 2023-11-10 NOTE — Progress Notes (Signed)
 PCCM note  Serial aliquots of right middle lobe and left lingula showed mildly increased bloody return indicating diffuse alveolar hemorrhage  RML                               Left Lingula     Patient noted to have desats postprocedure.  Will try to send her home on 4 L supplemental oxygen Of note patient tells me that in rehab therapy they have to use oxygen for her during exertion and was noted to have sats around 90% before procedure so I suspect that she has had hypoxia issues for a while.  Madison Canion MD [facility] See Amion for pager  If no response to pager , please call [phone] until 7pm After 7:00 pm call Elink  [phone] 11/10/2023, 11:50 AM

## 2023-11-11 ENCOUNTER — Encounter (HOSPITAL_COMMUNITY): Payer: Self-pay | Admitting: Pulmonary Disease

## 2023-11-11 LAB — CYTOLOGY - NON PAP

## 2023-11-11 NOTE — Telephone Encounter (Signed)
 I called and discussed with patient Told her that she can take her aspirin  and all her regular meds Advised her to use the O2 during sleep and exertion as long as the sats are over 90. We will assess for portable O2 concentrator needs at return visit.  It is ok for her daughter to smoke in car as long as the tanks are in the trunk and there is no O2 flow in the vicinity It is ok for her to be in the sun Clarified that she got a bronchoscope and not endoscope She also requesting a referral to pulmonary rehab at [facility] [place] and order was placed  All other questions answered. Nothing further needed.  Phyllis Breeze MD [facility] 11/11/2023, 12:21 PM

## 2023-11-11 NOTE — Telephone Encounter (Signed)
 Contacted patient to follow up on the oxygen order that was placed yesterday through [employer]. Patient currently only has 1 portable tank- states she told [employer] to wait to bring the home concentrator until she returns from the beach next [place]. Patient states the tank makes a lot of noise, but she will take it to the beach. Patient states her tongue was very red and wanted to know if Dr. Waylan Haggard noticed it yesterday during the procedure. Also wanted to make sure it was okay for her to be in the sun with the medications she is on now. Please contact patient to advise on procedure and additional questions. She will be at work from 3pm-6pm but can answer the phone and she will be leaving for the beach later tonight.

## 2023-11-11 NOTE — Telephone Encounter (Signed)
 patient is calling to ask a question . They sent me home with oxygen and today I was low this morning and I put it on and I went to sleep and woke up . I'm going to the beach today and I'm wondering will it be safe in back hatchback of the car and want to make sure that's ok . I'm going to carry it with me but I dont plan on wearing doing The drive . And my daughter do smoke  [phone] please give patient a call concerning this information about the oxygen tank  I took a Asprin last night . All my other stuff they told me to start today . Just wanted to make sure to take the Asprin now or should I wait

## 2023-11-12 LAB — CULTURE, BAL-QUANTITATIVE W GRAM STAIN
Culture: NO GROWTH
Culture: NO GROWTH

## 2023-11-12 LAB — ACID FAST SMEAR (AFB, MYCOBACTERIA)
Acid Fast Smear: NEGATIVE
Acid Fast Smear: NEGATIVE

## 2023-11-15 LAB — AEROBIC/ANAEROBIC CULTURE W GRAM STAIN (SURGICAL/DEEP WOUND)
Culture: NO GROWTH
Culture: NORMAL

## 2023-11-16 ENCOUNTER — Ambulatory Visit: Admitting: Acute Care

## 2023-11-16 ENCOUNTER — Other Ambulatory Visit: Payer: Self-pay | Admitting: Acute Care

## 2023-11-16 ENCOUNTER — Encounter: Payer: Self-pay | Admitting: Acute Care

## 2023-11-16 VITALS — BP 145/84 | HR 77 | Ht 68.0 in | Wt 196.2 lb

## 2023-11-16 DIAGNOSIS — E039 Hypothyroidism, unspecified: Secondary | ICD-10-CM

## 2023-11-16 DIAGNOSIS — R700 Elevated erythrocyte sedimentation rate: Secondary | ICD-10-CM

## 2023-11-16 DIAGNOSIS — R042 Hemoptysis: Secondary | ICD-10-CM

## 2023-11-16 DIAGNOSIS — R7989 Other specified abnormal findings of blood chemistry: Secondary | ICD-10-CM

## 2023-11-16 DIAGNOSIS — Z5181 Encounter for therapeutic drug level monitoring: Secondary | ICD-10-CM

## 2023-11-16 DIAGNOSIS — Z87891 Personal history of nicotine dependence: Secondary | ICD-10-CM

## 2023-11-16 DIAGNOSIS — L659 Nonscarring hair loss, unspecified: Secondary | ICD-10-CM

## 2023-11-16 DIAGNOSIS — R5383 Other fatigue: Secondary | ICD-10-CM

## 2023-11-16 DIAGNOSIS — D649 Anemia, unspecified: Secondary | ICD-10-CM

## 2023-11-16 NOTE — Addendum Note (Signed)
 Addended by: Damontae Loppnow V on: 11/16/2023 12:41 PM   Modules accepted: Orders

## 2023-11-16 NOTE — Patient Instructions (Addendum)
 It is good to see you today.  Your biopsies were negative for malignancy.  This is great news. We will do a 3 month follow up CT Chest. You will get a call to schedule this.  You will follow up with Dr. Waylan Haggard after the scan.  You will get a call to schedule this.  Please check with your insurance about which diagnosis they will cover for TSH lab draw and ESR ( Sed rate) lab draw. Both were elevated in March 2025, and need to be rechecked.  If your blood in the sputum gets remarkable worse , call to be seen sooner.  Wear your oxygen at bedtime, remember sats need to be > 88% at all times.  We will walk you today for a portable oxygen.  Call is if you need us  sooner. Please contact office for sooner follow up if symptoms do not improve or worsen or seek emergency care

## 2023-11-16 NOTE — Progress Notes (Deleted)
 History of Present Illness Madison West is a 68 y.o. female with ***   11/16/2023  Test Results: Cytology 11/10/2023 B. LUNG, LEFT LINGULAR, LAVAGE:    FINAL MICROSCOPIC DIAGNOSIS:  - No malignant cells identified  - Numerous pulmonary macrophages with scattered benign bronchial cells   Component Ref Range & Units (hover) 6 d ago (11/10/23) 6 d ago (11/10/23)  Fluid Type-FCT BAL LLL TUBE B VC BAL RML TUBE A VC, CM  Comment: CORRECTED ON 05/21 AT 1013: PREVIOUSLY REPORTED AS LUNG  Color, Fluid COLORLESS Abnormal  PINK Abnormal   Appearance, Fluid HAZY Abnormal  HAZY Abnormal   Total Nucleated Cell Count, Fluid 113 207  Neutrophil Count, Fluid 8 19  Lymphs, Fluid 16 5  Monocyte-Macrophage-Serous Fluid 74 74  Eos, Fluid 1 2  Other Cells, Fluid 1 BASOPHIL OTHER CELLS IDENTIFIED AS MESOTHELIAL CELLS CM  Comment: OTHER CELLS IDENTIFIED AS MESOTHELIAL CELLS   C. LYMPH NODE, STATION 7, FINE NEEDLE ASPIRATION:  - No malignant cells identified  - Compatible with a benign lymph node  - Numerous benign bronchial cells and pulmonary macrophages    AFB Negative  BAL No Growth Aerobic. Anaerobic No Growth Negative for fungus    Latest Ref Rng & Units 11/03/2023    4:23 PM 09/08/2023    3:03 PM 05/16/2023    9:51 PM  CBC  WBC 4.0 - 10.5 K/uL 5.8  5.4  8.3   Hemoglobin 12.0 - 15.0 g/dL 65.7  84.6  96.2   Hematocrit 36.0 - 46.0 % 37.7  35.9  42.8   Platelets 150.0 - 400.0 K/uL 386.0  393.0  336        Latest Ref Rng & Units 11/03/2023    4:23 PM 09/02/2023    2:17 PM 05/16/2023    9:51 PM  BMP  Glucose 70 - 99 mg/dL 75  94  952   BUN 6 - 23 mg/dL 19  15  11    Creatinine 0.40 - 1.20 mg/dL 8.41  3.24  4.01   BUN/Creat Ratio 12 - 28  15    Sodium 135 - 145 mEq/L 132  138  137   Potassium 3.5 - 5.1 mEq/L 4.2  4.6  4.4   Chloride 96 - 112 mEq/L 99  103  103   CO2 19 - 32 mEq/L 23  18  25    Calcium  8.4 - 10.5 mg/dL 9.7  9.6  8.9     BNP    Component Value Date/Time    BNP 418.7 (H) 02/14/2023 0346    ProBNP    Component Value Date/Time   PROBNP 866 (H) 09/02/2023 1417    PFT No results found for: "FEV1PRE", "FEV1POST", "FVCPRE", "FVCPOST", "TLC", "DLCOUNC", "PREFEV1FVCRT", "PSTFEV1FVCRT"  DG CHEST PORT 1 VIEW Result Date: 11/10/2023 CLINICAL DATA:  [id number] Status post bronchoscopy [id number] EXAM: PORTABLE CHEST - 1 VIEW COMPARISON:  September 08, 2023 FINDINGS: Patchy airspace opacities in the right midlung and left lung base. No pneumothorax or pleural effusion. No cardiomegaly. Endovascular aortic valve replacement. Tortuous aorta with aortic atherosclerosis. No acute fracture or destructive lesions. Multilevel thoracic osteophytosis. Osteopenia. Surgical clips in the right breast/axilla. IMPRESSION: Patchy airspace opacities in the right midlung and left lung base, which may represent atelectasis. Alternatively, this could represent a developing bronchopneumonia in the correct clinical context. No pneumothorax. Electronically Signed   By: Rance Burrows M.D.   On: 11/10/2023 11:12   CT CHEST HIGH RESOLUTION Result Date: 10/29/2023 CLINICAL DATA:  Interstitial lung disease. Cough, hemoptysis, shortness of breath on exertion. EXAM: CT CHEST WITHOUT CONTRAST TECHNIQUE: Multidetector CT imaging of the chest was performed following the standard protocol without intravenous contrast. High resolution imaging of the lungs, as well as inspiratory and expiratory imaging, was performed. RADIATION DOSE REDUCTION: This exam was performed according to the departmental dose-optimization program which includes automated exposure control, adjustment of the mA and/or kV according to patient size and/or use of iterative reconstruction technique. COMPARISON:  01/23/2023. FINDINGS: Cardiovascular: Atherosclerotic calcification of the aorta and coronary arteries. Aortic valve replacement. Heart is at the upper limits of normal in size. Trace amount of pericardial fluid may be  physiologic. Mediastinum/Nodes: Similar small to borderline enlarged mediastinal lymph nodes, measuring up to 12 mm in the low right paratracheal station. Hilar regions are difficult to definitively evaluate without IV contrast. Surgical clips in the right axilla. Lumpectomy clips in the right breast. Esophagus is grossly unremarkable. Lungs/Pleura: Ground-glass is seen throughout the lungs. Scattered septal thickening and subpleural reticulation. No bronchiectasis or honeycombing. There is air trapping. No suspicious pulmonary nodules. No pleural fluid. Airway is unremarkable. Upper Abdomen: Slight nodular thickening of the adrenal glands. No specific follow-up necessary. Visualized portions of the liver, gallbladder, adrenal glands, kidneys, spleen, pancreas, stomach and bowel are otherwise grossly unremarkable. No upper abdominal adenopathy. Musculoskeletal: Degenerative changes in the spine. Old L1 compression fracture. IMPRESSION: 1. Pulmonary parenchymal pattern of interstitial lung disease and air trapping is suggestive of nonfibrotic hypersensitivity pneumonitis. Findings are suggestive of an alternative diagnosis (not UIP) per consensus guidelines: Diagnosis of Idiopathic Pulmonary Fibrosis: An Official ATS/ERS/JRS/ALAT Clinical Practice Guideline. Am Annie Barton Crit Care Med Vol 198, Iss 5, [id number], Feb 20 2017. 2. Small to borderline enlarged mediastinal lymph nodes, stable and likely reactive in etiology. 3. Aortic atherosclerosis (ICD10-I70.0). Coronary artery calcification. Electronically Signed   By: Shearon Denis M.D.   On: 10/29/2023 13:35     Past medical hx Past Medical History:  Diagnosis Date   Anxiety    Aortic stenosis    s/p TAVR 02/04/23   Arthritis    low back and hip pain intermittent   Breast cancer (HCC) 06/02/2007   r breast -surgery ,radiaology. chemotherapy   Carotid artery disease (HCC)    Carpal tunnel syndrome    right hand   CHF (congestive heart failure) (HCC)     Colon polyps    Complication of anesthesia    Fentanyl , Versed -makes extra hyper, bradycardia x 1 in PACU, [facility] (08/15/11 cardiology felt neostigmine may have resulted in AV nodal block)    Coronary artery disease    Depression    denies   DVT (deep venous thrombosis) (HCC)    provoked left IJ 02/10/23   Dysplasia of vulva    Hypertension    Palpitations    PSVT, s/p adenosine  08/04/16   S/P breast lumpectomy 07/04/2007   R breast   S/P radiation therapy 2009   Vulva cancer (HCC)      Social History   Tobacco Use   Smoking status: Former    Current packs/day: 0.00    Average packs/day: 0.8 packs/day for 20.0 years (15.0 ttl pk-yrs)    Types: Cigarettes    Start date: 05/06/1977    Quit date: 05/06/1997    Years since quitting: 26.5   Smokeless tobacco: Never  Vaping Use   Vaping status: Never Used  Substance Use Topics   Alcohol use: Yes    Alcohol/week: 2.0 standard drinks of alcohol  Types: 2 Standard drinks or equivalent per week    Comment: 2-3 drinks per week usually   Drug use: No    Ms.Spaeth reports that she quit smoking about 26 years ago. Her smoking use included cigarettes. She started smoking about 46 years ago. She has a 15 pack-year smoking history. She has never used smokeless tobacco. She reports current alcohol use of about 2.0 standard drinks of alcohol per week. She reports that she does not use drugs.  Tobacco Cessation: Counseling given: Not Answered   Past surgical hx, Family hx, Social hx all reviewed.  Current Outpatient Medications on File Prior to Visit  Medication Sig   acetaminophen  (TYLENOL ) 325 MG tablet Take 2 tablets (650 mg total) by mouth every 6 (six) hours. (Patient taking differently: Take 500 mg by mouth every 6 (six) hours.)   alprazolam  (XANAX ) 2 MG tablet Take 0.25-2 mg by mouth See admin instructions. 1 mg (may take up to 2 mg) twice daily. May also take 0.25 mg during the day if needed for anxiety.   aspirin  EC 81 MG  tablet Take 1 tablet (81 mg total) by mouth daily. Swallow whole.   busPIRone  (BUSPAR ) 10 MG tablet TAKE 1 TABLET BY MOUTH TWICE A DAY (Patient taking differently: Take 5 mg by mouth 2 (two) times daily.)   clopidogrel  (PLAVIX ) 75 MG tablet Take 1 tablet (75 mg total) by mouth daily.   dapagliflozin  propanediol (FARXIGA ) 10 MG TABS tablet Take 1 tablet (10 mg total) by mouth daily before breakfast.   ezetimibe  (ZETIA ) 10 MG tablet Take 1 tablet (10 mg total) by mouth daily. (Patient not taking: Reported on 09/08/2023)   furosemide  (LASIX ) 20 MG tablet Take 0.5 tablets (10 mg total) by mouth daily as needed (for lower extremity swelling and weight gain).   losartan  (COZAAR ) 25 MG tablet Take 1 tablet (25 mg total) by mouth daily.   methocarbamol  (ROBAXIN ) 500 MG tablet Take 1 tablet (500 mg total) by mouth 2 (two) times daily.   nitroGLYCERIN  (NITROSTAT ) 0.4 MG SL tablet Place 1 tablet (0.4 mg total) under the tongue every 5 (five) minutes as needed for chest pain.   oxyCODONE  (ROXICODONE ) 5 MG immediate release tablet Take 1 tablet (5 mg total) by mouth daily as needed for severe pain (pain score 7-10).   rosuvastatin  (CRESTOR ) 20 MG tablet Take 0.5 tablets (10 mg total) by mouth daily.   spironolactone  (ALDACTONE ) 25 MG tablet Take 0.5 tablets (12.5 mg total) by mouth daily.   No current facility-administered medications on file prior to visit.     Allergies  Allergen Reactions   Prednisone Palpitations   Fentanyl  Swelling and Other (See Comments)    Makes pt "hyper" feels she is "climbing the walls."  Last time she had a colonoscopy with Fentanyl  and Versed  she was awake the whole time.   Versed  [Midazolam ] Swelling and Anxiety    Makes pt "hyper" feels she is "climbing the walls."  Last time she had a colonoscopy with Fentanyl  and Versed  she was awake the whole time.   Vicodin [Hydrocodone -Acetaminophen ] Anxiety    Extreme anxiety.  OK to take oxycodone .    Review Of  Systems:  Constitutional:   No  weight loss, night sweats,  Fevers, chills, fatigue, or  lassitude.  HEENT:   No headaches,  Difficulty swallowing,  Tooth/dental problems, or  Sore throat,                No sneezing, itching, ear ache, nasal  congestion, post nasal drip,   CV:  No chest pain,  Orthopnea, PND, swelling in lower extremities, anasarca, dizziness, palpitations, syncope.   GI  No heartburn, indigestion, abdominal pain, nausea, vomiting, diarrhea, change in bowel habits, loss of appetite, bloody stools.   Resp: No shortness of breath with exertion or at rest.  No excess mucus, no productive cough,  No non-productive cough,  No coughing up of blood.  No change in color of mucus.  No wheezing.  No chest wall deformity  Skin: no rash or lesions.  GU: no dysuria, change in color of urine, no urgency or frequency.  No flank pain, no hematuria   MS:  No joint pain or swelling.  No decreased range of motion.  No back pain.  Psych:  No change in mood or affect. No depression or anxiety.  No memory loss.   Vital Signs There were no vitals taken for this visit.   Physical Exam:  General- No distress,  A&Ox3 ENT: No sinus tenderness, TM clear, pale nasal mucosa, no oral exudate,no post nasal drip, no LAN Cardiac: S1, S2, regular rate and rhythm, no murmur Chest: No wheeze/ rales/ dullness; no accessory muscle use, no nasal flaring, no sternal retractions Abd.: Soft Non-tender Ext: No clubbing cyanosis, edema Neuro:  normal strength Skin: No rashes, warm and dry Psych: normal mood and behavior   Assessment/Plan  No problem-specific Assessment & Plan notes found for this encounter.    Raejean Bullock, NP 11/16/2023  9:57 AM

## 2023-11-16 NOTE — Progress Notes (Signed)
 Expand All Collapse All                                                    Madison West is a 68 y.o. female former smoker with complex medical history noted below, see by Dr. Waylan Haggard for 9 months of  hemoptysis despite stopping Eliquis .   PMH Anxiety, Aortic stenosis, Arthritis, Breast cancer (HCC) (06/02/2007), Carotid artery disease (HCC), Carpal tunnel syndrome, CHF (congestive heart failure) (HCC), Colon polyps, Complication of anesthesia, Coronary artery disease, Depression, DVT (deep venous thrombosis) (HCC), Dysplasia of vulva, Hypertension, Palpitations, S/P breast lumpectomy (07/04/2007), S/P radiation therapy (2009), and Vulva cancer (HCC).      11/16/2023 Pt. Presents for follow up after bronchoscopy with biopsies.She states she has done well after the biopsies. She does still have some scant blood clots in her sputum. No fever, no discolored secretions . She went to the beach the next day but she did well. This is her baseline at present, and the reason she had the bronchoscopy with biopsies.  We have reviewed her cytology. It was negative for malignancy. There was notation of alveolar hemorrhage per Dr. Waylan Haggard. I have spoken with him about the negative biopsies. He feels it may be from Plavix . He spoke with her cardiologist and he does not want to hold the plavix  till at least end of this year as she had in stent thrombosis. Plan is that  we just monitor for now and reassure her that there is not evidence of malignancy.   I have ordered a 3 month follow up HRCT. She will follow up with Dr. Waylan Haggard after the scan.   She is taking her plavix  with ASA 81 mg. She also has been referred to pulmonary rehab. She states she is not using her oxygen at night. I have reminded her to use her oxygen at bedtime. We will walk her for a POC today.   Of note, her ANA was mildly elevated, her TSH and ESR were elevated. Dr. Waymond Hailey felt this may be due to Amiodarone  use. This medication has been  discontinued. I tried to order repeat ESR and TSH, but her insurance would not allow me to sign the orders, despite changing diagnosis code multiple times. I have asked her to call her insurance company to see which diagnosis they will cover this for.    Test Results: Cytology 11/10/2023 B. LUNG, LEFT LINGULAR, LAVAGE:    FINAL MICROSCOPIC DIAGNOSIS:  - No malignant cells identified  - Numerous pulmonary macrophages with scattered benign bronchial cells    Component Ref Range & Units (hover) 6 d ago (11/10/23) 6 d ago (11/10/23)  Fluid Type-FCT BAL LLL TUBE B VC BAL RML TUBE A VC, CM  Comment: CORRECTED ON 05/21 AT 1013: PREVIOUSLY REPORTED AS LUNG  Color, Fluid COLORLESS Abnormal  PINK Abnormal   Appearance, Fluid HAZY Abnormal  HAZY Abnormal   Total Nucleated Cell Count, Fluid 113 207  Neutrophil Count, Fluid 8 19  Lymphs, Fluid 16 5  Monocyte-Macrophage-Serous Fluid 74 74  Eos, Fluid 1 2  Other Cells, Fluid 1 BASOPHIL OTHER CELLS IDENTIFIED AS MESOTHELIAL CELLS CM  Comment: OTHER CELLS IDENTIFIED AS MESOTHELIAL CELLS    C. LYMPH NODE, STATION 7, FINE NEEDLE ASPIRATION:  - No malignant cells identified  - Compatible with a benign lymph node  -  Numerous benign bronchial cells and pulmonary macrophages      AFB Negative  BAL No Growth Aerobic. Anaerobic No Growth Negative for fungus     Latest Ref Rng & Units 11/03/2023    4:23 PM 09/08/2023    3:03 PM 05/16/2023    9:51 PM  CBC  WBC 4.0 - 10.5 K/uL 5.8  5.4  8.3   Hemoglobin 12.0 - 15.0 g/dL 40.9  81.1  91.4   Hematocrit 36.0 - 46.0 % 37.7  35.9  42.8   Platelets 150.0 - 400.0 K/uL 386.0  393.0  336           Latest Ref Rng & Units 11/03/2023    4:23 PM 09/02/2023    2:17 PM 05/16/2023    9:51 PM  BMP  Glucose 70 - 99 mg/dL 75  94  782   BUN 6 - 23 mg/dL 19  15  11    Creatinine 0.40 - 1.20 mg/dL 9.56  2.13  0.86   BUN/Creat Ratio 12 - 28   15     Sodium 135 - 145 mEq/L 132  138  137   Potassium 3.5 - 5.1 mEq/L  4.2  4.6  4.4   Chloride 96 - 112 mEq/L 99  103  103   CO2 19 - 32 mEq/L 23  18  25    Calcium  8.4 - 10.5 mg/dL 9.7  9.6  8.9       BNP Labs (Brief)          Component Value Date/Time    BNP 418.7 (H) 02/14/2023 0346        ProBNP Labs (Brief)          Component Value Date/Time    PROBNP 866 (H) 09/02/2023 1417        PFT Labs (Brief)  No results found for: "FEV1PRE", "FEV1POST", "FVCPRE", "FVCPOST", "TLC", "DLCOUNC", "PREFEV1FVCRT", "PSTFEV1FVCRT"      Imaging Results  DG CHEST PORT 1 VIEW Result Date: 11/10/2023 CLINICAL DATA:  [id number] Status post bronchoscopy [id number] EXAM: PORTABLE CHEST - 1 VIEW COMPARISON:  September 08, 2023 FINDINGS: Patchy airspace opacities in the right midlung and left lung base. No pneumothorax or pleural effusion. No cardiomegaly. Endovascular aortic valve replacement. Tortuous aorta with aortic atherosclerosis. No acute fracture or destructive lesions. Multilevel thoracic osteophytosis. Osteopenia. Surgical clips in the right breast/axilla. IMPRESSION: Patchy airspace opacities in the right midlung and left lung base, which may represent atelectasis. Alternatively, this could represent a developing bronchopneumonia in the correct clinical context. No pneumothorax. Electronically Signed   By: Rance Burrows M.D.   On: 11/10/2023 11:12    CT CHEST HIGH RESOLUTION Result Date: 10/29/2023 CLINICAL DATA:  Interstitial lung disease. Cough, hemoptysis, shortness of breath on exertion. EXAM: CT CHEST WITHOUT CONTRAST TECHNIQUE: Multidetector CT imaging of the chest was performed following the standard protocol without intravenous contrast. High resolution imaging of the lungs, as well as inspiratory and expiratory imaging, was performed. RADIATION DOSE REDUCTION: This exam was performed according to the departmental dose-optimization program which includes automated exposure control, adjustment of the mA and/or kV according to patient size and/or use of iterative  reconstruction technique. COMPARISON:  01/23/2023. FINDINGS: Cardiovascular: Atherosclerotic calcification of the aorta and coronary arteries. Aortic valve replacement. Heart is at the upper limits of normal in size. Trace amount of pericardial fluid may be physiologic. Mediastinum/Nodes: Similar small to borderline enlarged mediastinal lymph nodes, measuring up to 12 mm in the low right paratracheal station. Hilar regions are  difficult to definitively evaluate without IV contrast. Surgical clips in the right axilla. Lumpectomy clips in the right breast. Esophagus is grossly unremarkable. Lungs/Pleura: Ground-glass is seen throughout the lungs. Scattered septal thickening and subpleural reticulation. No bronchiectasis or honeycombing. There is air trapping. No suspicious pulmonary nodules. No pleural fluid. Airway is unremarkable. Upper Abdomen: Slight nodular thickening of the adrenal glands. No specific follow-up necessary. Visualized portions of the liver, gallbladder, adrenal glands, kidneys, spleen, pancreas, stomach and bowel are otherwise grossly unremarkable. No upper abdominal adenopathy. Musculoskeletal: Degenerative changes in the spine. Old L1 compression fracture. IMPRESSION: 1. Pulmonary parenchymal pattern of interstitial lung disease and air trapping is suggestive of nonfibrotic hypersensitivity pneumonitis. Findings are suggestive of an alternative diagnosis (not UIP) per consensus guidelines: Diagnosis of Idiopathic Pulmonary Fibrosis: An Official ATS/ERS/JRS/ALAT Clinical Practice Guideline. Am Annie Barton Crit Care Med Vol 198, Iss 5, [phone], Feb 20 2017. 2. Small to borderline enlarged mediastinal lymph nodes, stable and likely reactive in etiology. 3. Aortic atherosclerosis (ICD10-I70.0). Coronary artery calcification. Electronically Signed   By: Shearon Denis M.D.   On: 10/29/2023 13:35         Past medical hx     Past Medical History:  Diagnosis Date   Anxiety     Aortic stenosis       s/p TAVR 02/04/23   Arthritis      low back and hip pain intermittent   Breast cancer (HCC) 06/02/2007    r breast -surgery ,radiaology. chemotherapy   Carotid artery disease (HCC)     Carpal tunnel syndrome      right hand   CHF (congestive heart failure) (HCC)     Colon polyps     Complication of anesthesia      Fentanyl , Versed -makes extra hyper, bradycardia x 1 in PACU, [facility] (08/15/11 cardiology felt neostigmine may have resulted in AV nodal block)    Coronary artery disease     Depression      denies   DVT (deep venous thrombosis) (HCC)      provoked left IJ 02/10/23   Dysplasia of vulva     Hypertension     Palpitations      PSVT, s/p adenosine  08/04/16   S/P breast lumpectomy 07/04/2007    R breast   S/P radiation therapy 2009   Vulva cancer [facility])            Social History  Social History         Tobacco Use   Smoking status: Former      Current packs/day: 0.00      Average packs/day: 0.8 packs/day for 20.0 years (15.0 ttl pk-yrs)      Types: Cigarettes      Start date: 05/06/1977      Quit date: 05/06/1997      Years since quitting: 26.5   Smokeless tobacco: Never  Vaping Use   Vaping status: Never Used  Substance Use Topics   Alcohol use: Yes      Alcohol/week: 2.0 standard drinks of alcohol      Types: 2 Standard drinks or equivalent per week      Comment: 2-3 drinks per week usually   Drug use: No        Ms.Lasater reports that she quit smoking about 26 years ago. Her smoking use included cigarettes. She started smoking about 46 years ago. She has a 15 pack-year smoking history. She has never used smokeless tobacco. She reports current alcohol use of about 2.0  standard drinks of alcohol per week. She reports that she does not use drugs.   Tobacco Cessation: Counseling given: Not Answered     Past surgical hx, Family hx, Social hx all reviewed.       Current Outpatient Medications on File Prior to Visit  Medication Sig   acetaminophen   (TYLENOL ) 325 MG tablet Take 2 tablets (650 mg total) by mouth every 6 (six) hours. (Patient taking differently: Take 500 mg by mouth every 6 (six) hours.)   alprazolam  (XANAX ) 2 MG tablet Take 0.25-2 mg by mouth See admin instructions. 1 mg (may take up to 2 mg) twice daily. May also take 0.25 mg during the day if needed for anxiety.   aspirin  EC 81 MG tablet Take 1 tablet (81 mg total) by mouth daily. Swallow whole.   busPIRone  (BUSPAR ) 10 MG tablet TAKE 1 TABLET BY MOUTH TWICE A DAY (Patient taking differently: Take 5 mg by mouth 2 (two) times daily.)   clopidogrel  (PLAVIX ) 75 MG tablet Take 1 tablet (75 mg total) by mouth daily.   dapagliflozin  propanediol (FARXIGA ) 10 MG TABS tablet Take 1 tablet (10 mg total) by mouth daily before breakfast.   ezetimibe  (ZETIA ) 10 MG tablet Take 1 tablet (10 mg total) by mouth daily. (Patient not taking: Reported on 09/08/2023)   furosemide  (LASIX ) 20 MG tablet Take 0.5 tablets (10 mg total) by mouth daily as needed (for lower extremity swelling and weight gain).   losartan  (COZAAR ) 25 MG tablet Take 1 tablet (25 mg total) by mouth daily.   methocarbamol  (ROBAXIN ) 500 MG tablet Take 1 tablet (500 mg total) by mouth 2 (two) times daily.   nitroGLYCERIN  (NITROSTAT ) 0.4 MG SL tablet Place 1 tablet (0.4 mg total) under the tongue every 5 (five) minutes as needed for chest pain.   oxyCODONE  (ROXICODONE ) 5 MG immediate release tablet Take 1 tablet (5 mg total) by mouth daily as needed for severe pain (pain score 7-10).   rosuvastatin  (CRESTOR ) 20 MG tablet Take 0.5 tablets (10 mg total) by mouth daily.   spironolactone  (ALDACTONE ) 25 MG tablet Take 0.5 tablets (12.5 mg total) by mouth daily.      No current facility-administered medications on file prior to visit.        Allergies       Allergies  Allergen Reactions   Prednisone Palpitations   Fentanyl  Swelling and Other (See Comments)      Makes pt "hyper" feels she is "climbing the walls."  Last time she  had a colonoscopy with Fentanyl  and Versed  she was awake the whole time.   Versed  [Midazolam ] Swelling and Anxiety      Makes pt "hyper" feels she is "climbing the walls."  Last time she had a colonoscopy with Fentanyl  and Versed  she was awake the whole time.   Vicodin [Hydrocodone -Acetaminophen ] Anxiety      Extreme anxiety.   OK to take oxycodone .        Review Of Systems:   Constitutional:   No  weight loss, night sweats,  Fevers, chills, fatigue, or  lassitude.   HEENT:   No headaches,  Difficulty swallowing,  Tooth/dental problems, or  Sore throat,                No sneezing, itching, ear ache, nasal congestion, post nasal drip,    CV:  No chest pain,  Orthopnea, PND, swelling in lower extremities, anasarca, dizziness, palpitations, syncope.    GI  No heartburn, indigestion, abdominal pain, nausea,  vomiting, diarrhea, change in bowel habits, loss of appetite, bloody stools.    Resp: + shortness of breath with exertion none  at rest.  + excess mucus, + productive cough,  No non-productive cough,  + baseline  coughing up of blood.  No change in color of mucus.  No wheezing.  No chest wall deformity   Skin: no rash or lesions.   GU: no dysuria, change in color of urine, no urgency or frequency.  No flank pain, no hematuria    MS:  No joint pain or swelling.  No decreased range of motion.  No back pain.   Psych:  No change in mood or affect. No depression or anxiety.  No memory loss.     Vital Signs BP 145/84, Pulse 77, Weight 196 lb 3.2 oz (89 kg) , 5\' 8"  (1.727 m) , SpO2 95%     Physical Exam:   General- No distress,  A&Ox3, pleasant ENT: No sinus tenderness, TM clear, pale nasal mucosa, no oral exudate,no post nasal drip, no LAN Cardiac: S1, S2, regular rate and rhythm, no murmur Chest: No wheeze/ rales/ dullness; no accessory muscle use, no nasal flaring, no sternal retractions, diminished per bases Abd.: Soft Non-tender, ND, BS +, Body mass index is 29.83 kg/m.  Ext:  No clubbing cyanosis, edema Neuro:  normal strength, MAE x 4, A&O x 3, appropriate Skin: No rashes, warm and dry, no lesions  Psych: normal mood and behavior     Assessment/Plan Post bronchoscopy with biopsies Hemoptysis of unknown etiology Former smoker  History of breast cancer Elevated TSH and ESR Plan Your biopsies were negative for malignancy.  This is great news. We will do a 3 month follow up CT Chest. You will get a call to schedule this.  You will follow up with Dr. Waylan Haggard after the scan.  You will get a call to schedule this.  Please check with your insurance about which diagnosis they will cover for TSH lab draw and ESR ( Sed rate) lab draw. Both were elevated in March 2025, and need to be rechecked.  If your blood in the sputum gets remarkable worse , call to be seen sooner.  Wear your oxygen at bedtime, remember sats need to be > 88% at all times.  We will walk you today for a portable oxygen.  Call is if you need us  sooner. Please contact office for sooner follow up if symptoms do not improve or worsen or seek emergency care     I spent 37 minutes dedicated to the care of this patient on the date of this encounter to include pre-visit review of records, face-to-face time with the patient discussing conditions above, post visit ordering of testing, clinical documentation with the electronic health record, making appropriate referrals as documented, and communicating necessary information to the patient's healthcare team.      Raejean Bullock, NP 11/16/2023  9:57 AM

## 2023-11-17 ENCOUNTER — Encounter (HOSPITAL_COMMUNITY)
Admission: RE | Admit: 2023-11-17 | Discharge: 2023-11-17 | Disposition: A | Discharge status: Home / Self Care | Source: Ambulatory Visit | Attending: Cardiology | Admitting: Cardiology

## 2023-11-17 ENCOUNTER — Telehealth (HOSPITAL_COMMUNITY): Payer: Self-pay

## 2023-11-17 ENCOUNTER — Telehealth: Payer: Self-pay

## 2023-11-17 DIAGNOSIS — Z952 Presence of prosthetic heart valve: Secondary | ICD-10-CM

## 2023-11-17 NOTE — Telephone Encounter (Signed)
 Pt insurance is active and benefits verified through [employer]. Co-pay $0, DED $257/unknown met, out of pocket $0/$0 met, co-insurance 20%. No pre-authorization required. 11/17/2023 @ 3:23pm.  2ndary insurance is active and benefits verified through [employer]. Co-pay $0, DED $0/$0 met, out of pocket $0/$0 met, co-insurance 0%. No pre-authorization required.

## 2023-11-17 NOTE — Progress Notes (Signed)
 Discharge Progress Report  Patient Details  Name: Madison West MRN: [id number] Date of Birth: [birth year] Referring Provider:   Flowsheet Row INTENSIVE CARDIAC REHAB ORIENT from 07/26/2023 in [facility]  Referring Provider Newman Lawrence, MD     Number of Visits: 84  Reason for Discharge:  Patient reached a stable level of exercise. Patient independent in their exercise. Patient has met program and personal goals.  Smoking History:  Social History   Tobacco Use  Smoking Status Former   Current packs/day: 0.00   Average packs/day: 0.8 packs/day for 20.0 years (15.0 ttl pk-yrs)   Types: Cigarettes   Start date: 05/06/1977   Quit date: 05/06/1997   Years since quitting: 26.6  Smokeless Tobacco Never    Diagnosis:  02/04/23 S/P TAVR (transcatheter aortic valve replacement)  ADL UCSD:  Pulmonary Assessment Scores     Row Name 12/01/23 1314         ADL UCSD   ADL Phase Entry     SOB Score total 28       CAT Score   CAT Score 10       mMRC Score   mMRC Score 2        Initial Exercise Prescription:  Initial Exercise Prescription - 12/01/23 1400       Date of Initial Exercise RX and Referring Provider   Date 12/01/23    Referring Provider Mannam    Expected Discharge Date 02/29/24      Oxygen   Oxygen Continuous    Liters 1    Maintain Oxygen Saturation 88% or higher      Bike   Level 2    Watts 70    Minutes 15    METs 2.2      Track   Laps 8    Minutes 15    METs 2.2      Prescription Details   Frequency (times per week) 2    Duration Progress to 30 minutes of continuous aerobic without signs/symptoms of physical distress      Intensity   THRR 40-80% of Max Heartrate 60-120    Ratings of Perceived Exertion 11-13    Perceived Dyspnea 0-4      Progression   Progression Continue to progress workloads to maintain intensity without signs/symptoms of physical distress.       Resistance Training   Training Prescription Yes    Weight blue bands    Reps 10-15          Discharge Exercise Prescription (Final Exercise Prescription Changes):  Exercise Prescription Changes - 12/14/23 1300       Response to Exercise   Blood Pressure (Admit) 112/64    Blood Pressure (Exercise) 108/64    Blood Pressure (Exit) 94/62    Heart Rate (Admit) 84 bpm    Heart Rate (Exercise) 105 bpm    Heart Rate (Exit) 96 bpm    Oxygen Saturation (Admit) 92 %   RA   Oxygen Saturation (Exercise) 92 %   2L   Oxygen Saturation (Exit) 92 %    Rating of Perceived Exertion (Exercise) 12    Perceived Dyspnea (Exercise) 2    Duration Continue with 30 min of aerobic exercise without signs/symptoms of physical distress.    Intensity THRR unchanged      Progression   Progression Continue to progress workloads to maintain intensity without signs/symptoms of physical distress.      Resistance Training  Training Prescription Yes    Weight blue bands    Reps 10-15    Time 10 Minutes      Interval Training   Interval Training No      Oxygen   Oxygen Continuous    Liters 0-2      Bike   Level 3    Minutes 15    METs 3.8      Track   Laps 11    Minutes 15    METs 2.37      Oxygen   Maintain Oxygen Saturation 88% or higher          Functional Capacity:  6 Minute Walk     Row Name 07/26/23 1619 11/01/23 1300 12/01/23 1439     6 Minute Walk   Phase Initial Discharge Initial   Distance 1180 feet 1298 feet 1170 feet   Distance % Change -- 10 % --   Distance Feet Change -- 118 ft --   Walk Time 6 minutes 6 minutes 6 minutes   # of Rest Breaks 1  2:20-3:20 3/10 hip pain 0 0   MPH 2.23 2.5 2.22   METS 2.6 2.91 2.81   RPE 10 13 12    Perceived Dyspnea  1 1 1    VO2 Peak 9.11 10.2 9.83   Symptoms Yes (comment) Yes (comment) Yes (comment)  c/o 3/10 hip pain with distance   Comments Pt SpO2 down to 84%, recovered to 94% in around a minute and maintained. RPD = 1, resolved  with rest. 6/10 Hip pain at end of SaO2 dropped to 88% on 2L @ 4 minutes-, increased to 3L. Pt had mils SOB, RPD = 1.  2/10 bilateral leg pain --   Resting HR 68 bpm 81 bpm 85 bpm   Resting BP 112/60 120/60 100/58   Resting Oxygen Saturation  92 % 90 % 97 %   Exercise Oxygen Saturation  during 6 min walk 84 % 88 % 87 %   Max Ex. HR 100 bpm 108 bpm 112 bpm   Max Ex. BP 136/66 140/60 146/70   2 Minute Post BP 120/62 -- 130/70     Interval HR   1 Minute HR -- -- 99   2 Minute HR -- -- 104   3 Minute HR -- -- 109   4 Minute HR -- -- 112   5 Minute HR -- -- 112   6 Minute HR -- -- 112   2 Minute Post HR -- -- 93   Interval Heart Rate? -- -- Yes     Interval Oxygen   Interval Oxygen? -- Yes Yes   Baseline Oxygen Saturation % -- -- 97 %   1 Minute Oxygen Saturation % -- 93 % 89 %   1 Minute Liters of Oxygen -- 2 L 0 L   2 Minute Oxygen Saturation % -- 89 % 91 %  87 RA 1:40, up to 1L   2 Minute Liters of Oxygen -- 2 L 1 L   3 Minute Oxygen Saturation % -- 89 % 93 %   3 Minute Liters of Oxygen -- 2 L 1 L   4 Minute Oxygen Saturation % -- 88 % 92 %   4 Minute Liters of Oxygen -- 2 L  increased to 3L 1 L   5 Minute Oxygen Saturation % -- 91 % 93 %   5 Minute Liters of Oxygen -- 3 L 1 L   6 Minute Oxygen  Saturation % -- 91 % 92 %   6 Minute Liters of Oxygen -- 3 L 1 L   2 Minute Post Oxygen Saturation % -- 93 % 97 %   2 Minute Post Liters of Oxygen -- 2 L 1 L      Psychological, QOL, Others - Outcomes: PHQ 2/9:    12/01/2023    1:00 PM 11/17/2023    2:03 PM 08/02/2023    1:54 PM 07/26/2023    2:19 PM 07/05/2023    2:00 PM  Depression screen PHQ 2/9  Decreased Interest 0 0 0 1 0  Down, Depressed, Hopeless 0 0 0 0 0  PHQ - 2 Score 0 0 0 1 0  Altered sleeping 1 0  2   Tired, decreased energy 1 1  3    Change in appetite 1 0  1   Feeling bad or failure about yourself  0 0  0   Trouble concentrating 0 0  0   Moving slowly or fidgety/restless 0 0  0   Suicidal thoughts 0 0  0    PHQ-9 Score 3 1  7    Difficult doing work/chores Not difficult at all Not difficult at all  Not difficult at all     Quality of Life:  Quality of Life - 11/18/23 0727       Quality of Life Scores   Health/Function Pre 20 %   Post QOL scores and knowledge scores could not assessed as she did not bring in her paperwork.   Socioeconomic Pre 26.57 %    Psych/Spiritual Pre 18.86 %    Family Pre 18 %    GLOBAL Pre 21.1 %          Personal Goals: Goals established at orientation with interventions provided to work toward goal.  Personal Goals and Risk Factors at Admission - 12/01/23 1344       Core Components/Risk Factors/Patient Goals on Admission    Weight Management Yes;Obesity;Weight Loss    Intervention Weight Management: Develop a combined nutrition and exercise program designed to reach desired caloric intake, while maintaining appropriate intake of nutrient and fiber, sodium and fats, and appropriate energy expenditure required for the weight goal.;Weight Management: Provide education and appropriate resources to help participant work on and attain dietary goals.;Weight Management/Obesity: Establish reasonable short term and long term weight goals.;Obesity: Provide education and appropriate resources to help participant work on and attain dietary goals.    Admit Weight 194 lb 9.6 oz (88.3 kg)    Goal Weight: Short Term 184 lb (83.5 kg)    Goal Weight: Long Term 170 lb (77.1 kg)   pt goal   Expected Outcomes Short Term: Continue to assess and modify interventions until short term weight is achieved;Long Term: Adherence to nutrition and physical activity/exercise program aimed toward attainment of established weight goal;Weight Loss: Understanding of general recommendations for a balanced deficit meal plan, which promotes 1-2 lb weight loss per week and includes a negative energy balance of [phone] kcal/d;Understanding recommendations for meals to include 15-35% energy as protein,  25-35% energy from fat, 35-60% energy from carbohydrates, less than 200mg  of dietary cholesterol, 20-35 gm of total fiber daily;Understanding of distribution of calorie intake throughout the day with the consumption of 4-5 meals/snacks    Tobacco Cessation Yes    Number of packs per day pt states smokes occassionally with dgt    Intervention Assist the participant in steps to quit. Provide individualized education and counseling about committing to  Tobacco Cessation, relapse prevention, and pharmacological support that can be provided by physician.;Education officer, environmental, assist with locating and accessing local/national Quit Smoking programs, and support quit date choice.    Expected Outcomes Short Term: Will demonstrate readiness to quit, by selecting a quit date.;Long Term: Complete abstinence from all tobacco products for at least 12 months from quit date.;Short Term: Will quit all tobacco product use, adhering to prevention of relapse plan.    Improve shortness of breath with ADL's Yes    Intervention Provide education, individualized exercise plan and daily activity instruction to help decrease symptoms of SOB with activities of daily living.    Expected Outcomes Short Term: Improve cardiorespiratory fitness to achieve a reduction of symptoms when performing ADLs;Long Term: Be able to perform more ADLs without symptoms or delay the onset of symptoms    Stress Yes    Intervention Offer individual and/or small group education and counseling on adjustment to heart disease, stress management and health-related lifestyle change. Teach and support self-help strategies.;Refer participants experiencing significant psychosocial distress to appropriate mental health specialists for further evaluation and treatment. When possible, include family members and significant others in education/counseling sessions.    Expected Outcomes Short Term: Participant demonstrates changes in health-related behavior,  relaxation and other stress management skills, ability to obtain effective social support, and compliance with psychotropic medications if prescribed.;Long Term: Emotional wellbeing is indicated by absence of clinically significant psychosocial distress or social isolation.           Personal Goals Discharge:  Goals and Risk Factor Review     Row Name 08/11/23 9178 08/19/23 1537 09/21/23 0958 10/12/23 0908 11/09/23 1605     Core Components/Risk Factors/Patient Goals Review   Personal Goals Review Weight Management/Obesity;Heart Failure;Lipids;Hypertension;Stress;Improve shortness of breath with ADL's Weight Management/Obesity;Heart Failure;Lipids;Hypertension;Stress;Improve shortness of breath with ADL's Weight Management/Obesity;Heart Failure;Lipids;Hypertension;Stress;Improve shortness of breath with ADL's Weight Management/Obesity;Heart Failure;Lipids;Hypertension;Stress;Improve shortness of breath with ADL's Weight Management/Obesity;Heart Failure;Lipids;Hypertension;Stress;Improve shortness of breath with ADL's   Review Annalee started cardiac rehab on 07/28/23. Tishanna is off to a good start to exercise. oxygen saturatiobns have been consistantly dropping into the mid 80's. Order obtained to use oxygen with exercise. Patient agreeable.  Will use oxygen on next scheduled visit. Cameka is enjoying participating in cardiac rehab so far. Deandre is doing well with exercise at  cardiac rehab. Vital signs and oxygen saturations have been stable now that Jama is wearing oxygen with exercise. oxygen saturations have been in the low to mid 90's on 2l/min. Drema has an upcoming appointment with her pulmonolgist in March. Clois is doing well with exercise at  cardiac rehab. Vital signs and oxygen saturations have been stable now that Jama is wearing oxygen with exercise. Oxygen saturations have remained in the low to mid 90's on 2l/min. Jama would benefit from participating in pulmonary rehab when she  completes cardiac rehab. Jama says she is more interested in water aerobics. Jama has lost 1.7 kg since starting cardiac rehab. Rechy is doing well with exercise at  cardiac rehab. Vital signs have been stable.  Oxygen saturations have remained in the upper 80's to  mid 90's on 2l/min. Jama would benefit from participating in pulmonary rehab when she completes cardiac rehab. Jama says she is more interested in water aerobics. Jama has lost 2.0 kg since starting cardiac rehab. Will notify Dr Elmira about oxygen saturations. Aldea is doing well with exercise at  cardiac rehab. Vital signs have been stable.  Oxygen saturations have remained in the  upper 80's to  mid 90's on 2l/min. Jama would benefit from participating in pulmonary rehab when she completes cardiac rehab. Jama has lost 4 kg since starting cardiac rehab. Jama will complete cardiac rehab on 11/17/23   Expected Outcomes Taraya will continue to participate in cardiac rehab for exercise, nutrition and lifestyle modifications Javeah will continue to participate in cardiac rehab for exercise, nutrition and lifestyle modifications Loralai will continue to participate in cardiac rehab for exercise, nutrition and lifestyle modifications Haisley will continue to participate in cardiac rehab for exercise, nutrition and lifestyle modifications Kellye will continue to participate in cardiac rehab for exercise, nutrition and lifestyle modifications      Exercise Goals and Review:  Exercise Goals     Row Name 07/26/23 1623 12/01/23 1313           Exercise Goals   Increase Physical Activity Yes Yes      Intervention Provide advice, education, support and counseling about physical activity/exercise needs.;Develop an individualized exercise prescription for aerobic and resistive training based on initial evaluation findings, risk stratification, comorbidities and participant's personal goals. Provide advice, education, support and counseling about  physical activity/exercise needs.;Develop an individualized exercise prescription for aerobic and resistive training based on initial evaluation findings, risk stratification, comorbidities and participant's personal goals.      Expected Outcomes Short Term: Attend rehab on a regular basis to increase amount of physical activity.;Long Term: Exercising regularly at least 3-5 days a week.;Long Term: Add in home exercise to make exercise part of routine and to increase amount of physical activity. Short Term: Attend rehab on a regular basis to increase amount of physical activity.;Long Term: Exercising regularly at least 3-5 days a week.;Long Term: Add in home exercise to make exercise part of routine and to increase amount of physical activity.      Increase Strength and Stamina Yes Yes      Intervention Develop an individualized exercise prescription for aerobic and resistive training based on initial evaluation findings, risk stratification, comorbidities and participant's personal goals.;Provide advice, education, support and counseling about physical activity/exercise needs. Develop an individualized exercise prescription for aerobic and resistive training based on initial evaluation findings, risk stratification, comorbidities and participant's personal goals.;Provide advice, education, support and counseling about physical activity/exercise needs.      Expected Outcomes Short Term: Increase workloads from initial exercise prescription for resistance, speed, and METs.;Short Term: Perform resistance training exercises routinely during rehab and add in resistance training at home;Long Term: Improve cardiorespiratory fitness, muscular endurance and strength as measured by increased METs and functional capacity ( ) Short Term: Increase workloads from initial exercise prescription for resistance, speed, and METs.;Short Term: Perform resistance training exercises routinely during rehab and add in resistance  training at home;Long Term: Improve cardiorespiratory fitness, muscular endurance and strength as measured by increased METs and functional capacity ( )      Able to understand and use rate of perceived exertion (RPE) scale Yes Yes      Intervention Provide education and explanation on how to use RPE scale Provide education and explanation on how to use RPE scale      Expected Outcomes Short Term: Able to use RPE daily in rehab to express subjective intensity level;Long Term:  Able to use RPE to guide intensity level when exercising independently Short Term: Able to use RPE daily in rehab to express subjective intensity level;Long Term:  Able to use RPE to guide intensity level when exercising independently      Able to understand  and use Dyspnea scale -- Yes      Intervention -- Provide education and explanation on how to use Dyspnea scale      Expected Outcomes -- Short Term: Able to use Dyspnea scale daily in rehab to express subjective sense of shortness of breath during exertion;Long Term: Able to use Dyspnea scale to guide intensity level when exercising independently      Knowledge and understanding of Target Heart Rate Range (THRR) Yes Yes      Intervention Provide education and explanation of THRR including how the numbers were predicted and where they are located for reference Provide education and explanation of THRR including how the numbers were predicted and where they are located for reference      Expected Outcomes Short Term: Able to state/look up THRR;Short Term: Able to use daily as guideline for intensity in rehab;Long Term: Able to use THRR to govern intensity when exercising independently Short Term: Able to state/look up THRR;Short Term: Able to use daily as guideline for intensity in rehab;Long Term: Able to use THRR to govern intensity when exercising independently      Understanding of Exercise Prescription Yes Yes      Intervention Provide education, explanation, and written  materials on patient's individual exercise prescription Provide education, explanation, and written materials on patient's individual exercise prescription      Expected Outcomes Short Term: Able to explain program exercise prescription;Long Term: Able to explain home exercise prescription to exercise independently Short Term: Able to explain program exercise prescription;Long Term: Able to explain home exercise prescription to exercise independently         Exercise Goals Re-Evaluation:  Exercise Goals Re-Evaluation     Row Name 07/28/23 1414 09/13/23 1500           Exercise Goal Re-Evaluation   Exercise Goals Review Increase Physical Activity;Increase Strength and Stamina;Able to understand and use rate of perceived exertion (RPE) scale;Knowledge and understanding of Target Heart Rate Range (THRR);Understanding of Exercise Prescription Increase Physical Activity;Increase Strength and Stamina;Able to understand and use rate of perceived exertion (RPE) scale;Knowledge and understanding of Target Heart Rate Range (THRR);Understanding of Exercise Prescription      Comments Pt's first day in the [facility] program. Pt understands the exercise RX, RPE sclae and THRR. Reviewed METs and goals. Pt voices progress on her goals of increased strength, stamina and energy. Pt has goal of weight loss and has lost 5.6 kg to date.      Expected Outcomes Will continue to monitor patient and progress exercise workloads as tolerated. Will continue to monitor patient and progress exercise workloads as tolerated.         Nutrition & Weight - Outcomes:  Pre Biometrics - 12/01/23 1442       Pre Biometrics   Grip Strength 21 kg          Post Biometrics - 11/01/23 1704        Post  Biometrics   Height 5' 8 (1.727 m)    Weight 197 lb 12 oz (89.7 kg)    Waist Circumference 47 inches    Hip Circumference 51 inches    Waist to Hip Ratio 0.92 %    BMI (Calculated) 30.08    Triceps Skinfold 14 mm    % Body Fat  40.4 %    Grip Strength 20 kg    Flexibility 22 in    Single Leg Stand 18.57 seconds          Nutrition:  Nutrition Therapy & Goals - 12/07/23 1549       Nutrition Therapy   Diet Heart Healthy Diet    Drug/Food Interactions Statins/Certain Fruits      Personal Nutrition Goals   Nutrition Goal Patient to improve diet quality by using the plate method as a guide for meal planning to include lean protein/plant protein, fruits, vegetables, whole grains, nonfat dairy as part of a well-balanced diet.   goal in progress.   Personal Goal #2 Patient to identify strategies for weight loss of 0.5-2.0# per week.   goal in progress.   Comments Goals in progress. Jama has medical history of hyperlipidemia, s/p TAVR, STEMI, CHF, HTN, CAD, carotid artery stenosis. She recently completed intensive cardiac rehab. She is now starting pulmonary rehab for DOE, hemoptysis. She is motivated to lose weight to 170#; she lost about 6.6# throughout cardiac rehab. LDL has improved to 71; she continues zetia , crestor  and heart healthy diet modifications. Patient will benefit from attendance to pulmonary rehab and  adherence to nutrition, exercise, and lifestyle modification.      Intervention Plan   Intervention Prescribe, educate and counsel regarding individualized specific dietary modifications aiming towards targeted core components such as weight, hypertension, lipid management, diabetes, heart failure and other comorbidities.;Nutrition handout(s) given to patient.    Expected Outcomes Short Term Goal: Understand basic principles of dietary content, such as calories, fat, sodium, cholesterol and nutrients.;Long Term Goal: Adherence to prescribed nutrition plan.          Nutrition Discharge:   Education Questionnaire Score:  Knowledge Questionnaire Score - 12/01/23 1503       Knowledge Questionnaire Score   Pre Score 14/18          Goals reviewed with patient; copy given to patient.Lee  graduates  from  Intensive/Traditional cardiac rehab program on 11/17/23 with completion of  53 exercise and education sessions. Pt maintained good attendance and progressed nicely during their participation in rehab as evidenced by increased MET level. Lee increased her distance by 118 feet and lost 3.0 kg while enrolled.   Medication list reconciled. Repeat  PHQ score-1  .  Pt has made significant lifestyle changes and should be commended for her success.  Jama  achieved her goals during cardiac rehab.   Pt plans to continue exercise at pulmonary rehab.Jama also plans to swim during the summer.  Jama is scheduled to start pulmonary rehab  in June. We are proud of Lee's progress. Jama says that participating in cardiac rehab has been helpful for her. Patient did not return post exercise homework.  Hadassah Elpidio Quan RN BSN

## 2023-11-17 NOTE — Telephone Encounter (Signed)
 Patient scheduled for PR orientation at front desk after last CR class. Arranged with pulmonary team to have [name] come in for orientation on 6/11 at 1pm (she will be the 2nd orientation at 1pm that day). Patient will attend 1:15 class and start 6/19.

## 2023-11-17 NOTE — Telephone Encounter (Signed)
 Copied from CRM [id number]. Topic: General - Other >> Nov 16, 2023  4:00 PM Hilton Lucky wrote: Reason for CRM: Patient outreached to Medicare per Sara's instruction. Medicare refused to provide her a diagnosis code. Medicare stated the office must call and request that information, as they will not speak to a non-provider.  Please advise

## 2023-11-18 ENCOUNTER — Telehealth: Payer: Self-pay | Admitting: *Deleted

## 2023-11-18 NOTE — Telephone Encounter (Signed)
 Rc'd fax from [employer] for O2. Will fwd to Ms. Groce for signature

## 2023-11-22 NOTE — Telephone Encounter (Signed)
 Copied from CRM [id number]. Topic: Clinical - Order For Equipment >> Nov 18, 2023  2:59 PM Margarette Shawl wrote: Reason for CRM:   Pt is contacting clinic to get status update on order for portable oxygen that was sent to [facility]. She was contacted by [facility] Manna, and was advised she would need a walking test performed, even though she had done several for cardiology and pulmonology. Reviewed chart and advised order was sent to [facility], and clinic received form that requires signature by provider. Unable to see any other update in chart and do not see forms that were faxed to clinic by [employer]  Request call back for update  # [phone]  Per previous note CMN was received for oxygen, NP signature needed. Routing to [place], [place] who handles Sarah's paperwork.

## 2023-11-23 NOTE — Telephone Encounter (Addendum)
 Please call PT to clarify all these issues. TY.

## 2023-11-23 NOTE — Telephone Encounter (Signed)
 Called and spoke to patient.  She stated that insurance denied TSH and sed rate. She stated that she reached out to insurance was advised that our office needed to reach out to do a PA. Advised pt that we do not submit PA's for labs.   She is also interested in POC. Order placed 11/10/2023 to eval pt for POC.  Email sent to [facility] with [employer].   She is also requesting bronch results. She stated that Dr. Waylan Haggard was waiting on two test.   Dr. Waylan Haggard- please advise on bronch results.   Sarah, please advise on TSH and sed rate.

## 2023-11-23 NOTE — Telephone Encounter (Signed)
 I ret PT call on FU appt and she has a long list of unanswered questions:  CT results Status of POC (She got large canisters in error)

## 2023-11-24 LAB — HYPERSENSITIVITY PNEUMONITIS
A. Pullulans Abs: NEGATIVE
A.Fumigatus #1 Abs: NEGATIVE
Micropolyspora faeni, IgG: NEGATIVE
Pigeon Serum Abs: NEGATIVE
Thermoact. Saccharii: NEGATIVE
Thermoactinomyces vulgaris, IgG: NEGATIVE

## 2023-11-24 LAB — MYOMARKER 3 PLUS PROFILE (RDL)

## 2023-11-25 ENCOUNTER — Telehealth: Payer: Self-pay | Admitting: Cardiology

## 2023-11-25 MED ORDER — LOSARTAN POTASSIUM 25 MG PO TABS: 25.0000 mg | ORAL_TABLET | Freq: Every day | ORAL | 3 refills | Status: DC

## 2023-11-25 MED ORDER — LOSARTAN POTASSIUM 25 MG PO TABS: 25.0000 mg | ORAL_TABLET | Freq: Every day | ORAL | 0 refills | Status: DC

## 2023-11-25 NOTE — Telephone Encounter (Signed)
*  STAT* If patient is at the pharmacy, call can be transferred to refill team.   1. Which medications need to be refilled? (please list name of each medication and dose if known)  losartan  (COZAAR ) 25 MG tablet  2. Which pharmacy/location (including street and city if local pharmacy) is medication to be sent to?  [id number] - [place], [place] - [address] [phone]    3. Do they need a 30 day or 90 day supply? Completely out, 1 week supply to last until mail order arrives

## 2023-11-25 NOTE — Telephone Encounter (Signed)
 Pt's medication was sent to pt's pharmacy as requested. Confirmation received.

## 2023-11-25 NOTE — Telephone Encounter (Signed)
 Called and spoke with patient, provided recommendations per Dara Ear NP, she stated that her PCP had nothing to do with the lab work that was drawn, it was drawn at our office, it was requested by Dr. Waylan Haggard.  I advised her that she did not have a TSH or sed rate drawn when she saw Dr. Waylan Haggard on 10/25/2023.  I looked at her OV from March when she saw Dr. Waymond Hailey and he is the one that ordered the TSH and sed rate.  She said that Isa Manuel was trying to have them re drawn them because they were abnormal.  I let her know that I would follow up with Isa Manuel and let her know what was discussed.  She verbalized understanding.  She said she did receive the the results of her bronch, but there were some results that were not resulted at the time of her visit.  She said the [employer] gave her a heavy tank to carry in a bag and not a POC.  They told her that they did not receive an order from our office for a POC.  I looked back at the orders and there was an order sent on 11/10/2023.  She states she did the walk in our office and [employer] was trying to have her do another walk.  I let her know that I would follow up with our contacts at [employer] to see what happened and then one of us  would call her back.    Isa Manuel, do you want these labs drawn when she returns in August?  She does not go to see her PCP.

## 2023-11-29 ENCOUNTER — Encounter: Payer: Self-pay | Admitting: Physical Medicine and Rehabilitation

## 2023-11-29 ENCOUNTER — Encounter
Payer: Medicare Other | Attending: Physical Medicine and Rehabilitation | Admitting: Physical Medicine and Rehabilitation

## 2023-11-29 VITALS — Ht 68.0 in | Wt 195.0 lb

## 2023-11-29 DIAGNOSIS — R053 Chronic cough: Secondary | ICD-10-CM | POA: Diagnosis not present

## 2023-11-29 DIAGNOSIS — M545 Low back pain, unspecified: Secondary | ICD-10-CM | POA: Diagnosis not present

## 2023-11-29 DIAGNOSIS — M542 Cervicalgia: Secondary | ICD-10-CM | POA: Insufficient documentation

## 2023-11-29 DIAGNOSIS — F411 Generalized anxiety disorder: Secondary | ICD-10-CM | POA: Diagnosis present

## 2023-11-29 DIAGNOSIS — I2510 Atherosclerotic heart disease of native coronary artery without angina pectoris: Secondary | ICD-10-CM | POA: Insufficient documentation

## 2023-11-29 NOTE — Progress Notes (Addendum)
 Subjective:    Patient ID: Madison West, female    DOB: 1956/05/13, 68 y.o.   MRN: [id number]  HPI Madison West is a 68 year old woman who presents for hospital follow-up of debility.   1) Debility: -doing well walking -she has returned to bar tending -she also primarily takes care of her house -she would like to do cardiac rehab to improve her endurance  2) Cough/hemoptysis: -had CXR done but as not gotten results yet -she has not yet heard from pulmonology -continues to a mild degree  3) History of chest pain/CAD: -discussed her prior symptoms and experience with stent placement -completed cardiac debility and this helped tremendously  4) Cervical spine pain: -still present -bothers her all the time -discussed that she got an XR but has not heard the results yet -pain is worst when she cooks -she saw a spine specialist and therapy was recommended -she continues to take the muscle relaxer  5) Muscle weakness: -she feels she has in response to statin  6) Low back pain: -hurts when she works -wine and heating pad helps  Pain Inventory Average Pain 3 Pain Right Now 0 My pain is intermittent and aching  In the last 24 hours, has pain interfered with the following?  General activity 4 Relation with others 0 Enjoyment of life 0 What TIME of day is your pain at its worst? morning  and night  Sleep (in general) Good  Pain worst with walking, some activities  Pain improves with heat, medication  Relief with medication is a 9       Family History  Problem Relation Age of Onset   Colon cancer Mother 2   Heart disease Father        heart attack   Colon polyps Brother        one brother had large colon polyp that needed surgery to be removed   Heart disease Paternal Grandfather    Diabetes type II Brother    Social History   Socioeconomic History   Marital status: Widowed    Spouse name: Not on file   Number of children: 2   Years of  education: Not on file   Highest education level: Not on file  Occupational History   Occupation: server    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  Tobacco Use   Smoking status: Former    Current packs/day: 0.00    Average packs/day: 0.8 packs/day for 20.0 years (15.0 ttl pk-yrs)    Types: Cigarettes    Start date: 05/06/1977    Quit date: 05/06/1997    Years since quitting: 26.5   Smokeless tobacco: Never  Vaping Use   Vaping status: Never Used  Substance and Sexual Activity   Alcohol use: Yes    Alcohol/week: 2.0 standard drinks of alcohol    Types: 2 Standard drinks or equivalent per week    Comment: 2-3 drinks per week usually   Drug use: No   Sexual activity: Not Currently  Other Topics Concern   Not on file  Social History Narrative   Not on file   Social Drivers of Health   Financial Resource Strain: Low Risk  (03/10/2023)   Received from [facility]   Overall Financial Resource Strain (CARDIA)    Difficulty of Paying Living Expenses: Not hard at all  Food Insecurity: No Food Insecurity (03/10/2023)   Received from [facility]   Hunger Vital Sign    Worried About Running Out of Food in the  Last Year: Never true    Ran Out of Food in the Last Year: Never true  Transportation Needs: No Transportation Needs (03/10/2023)   Received from [facility]   PRAPARE - Transportation    Lack of Transportation (Medical): No    Lack of Transportation (Non-Medical): No  Physical Activity: Not on file  Stress: Not on file  Social Connections: Unknown (03/05/2023)   Received from [facility]   Social Network    Social Network: Not on file   Past Surgical History:  Procedure Laterality Date   APPENDECTOMY     age 53   CARDIAC CATHETERIZATION     CESAREAN SECTION     x 2   COLONOSCOPY W/ POLYPECTOMY     COLONOSCOPY WITH PROPOFOL  N/A 04/27/2016   Procedure: COLONOSCOPY WITH PROPOFOL ;  Surgeon: Garrett Kallman, MD;  Location: [facility];  Service: Endoscopy;  Laterality: N/A;    CORONARY STENT INTERVENTION N/A 01/29/2023   Procedure: CORONARY STENT INTERVENTION;  Surgeon: Cody Das, MD;  Location: [facility];  Service: Cardiovascular;  Laterality: N/A;   CORONARY THROMBECTOMY N/A 01/31/2023   Procedure: Coronary Thrombectomy;  Surgeon: Cody Das, MD;  Location: [facility];  Service: Cardiovascular;  Laterality: N/A;   CORONARY ULTRASOUND/IVUS N/A 01/31/2023   Procedure: Coronary Ultrasound/IVUS;  Surgeon: Cody Das, MD;  Location: [facility];  Service: Cardiovascular;  Laterality: N/A;   CORONARY/GRAFT ACUTE MI REVASCULARIZATION N/A 01/31/2023   Procedure: Coronary/Graft Acute MI Revascularization;  Surgeon: Cody Das, MD;  Location: [facility];  Service: Cardiovascular;  Laterality: N/A;   DILATION AND CURETTAGE OF UTERUS     multiple   ENDARTERECTOMY Right 06/09/2019   ENDARTERECTOMY Right 06/09/2019   Procedure: ENDARTERECTOMY CAROTID RIGHT;  Surgeon: Richrd Char, MD;  Location: [facility] OR;  Service: Vascular;  Laterality: Right;   IABP INSERTION N/A 02/02/2023   Procedure: IABP Insertion;  Surgeon: Cody Das, MD;  [facility];  Service: Cardiovascular;  Laterality: N/A;   INTRAUTERINE DEVICE INSERTION     IUD REMOVAL     LEFT HEART CATH AND CORONARY ANGIOGRAPHY N/A 02/27/2020   Procedure: LEFT HEART CATH AND CORONARY ANGIOGRAPHY;  Surgeon: Knox Perl, MD;  [facility]: [facility];  Service: Cardiovascular;  Laterality: N/A;   LEFT HEART CATH AND CORONARY ANGIOGRAPHY N/A 01/31/2023   Procedure: LEFT HEART CATH AND CORONARY ANGIOGRAPHY;  Surgeon: Cody Das, MD;  [facility]: [facility];  Service: Cardiovascular;  Laterality: N/A;   MASTECTOMY PARTIAL / LUMPECTOMY W/ AXILLARY LYMPHADENECTOMY Right    lumpectomy and lymph nodes removed   PATCH ANGIOPLASTY Right 06/09/2019   Procedure: Patch Angioplasty of right carotid artery using  hemashield paltinum finesse patch;  Surgeon: Richrd Char, MD;  Location: [facility] OR;  Service: Vascular;  Laterality: Right;   RIGHT AND LEFT HEART CATH N/A 02/01/2023   Procedure: RIGHT AND LEFT HEART CATH;  Surgeon: Cody Das, MD;  [facility];  Service: Cardiovascular;  Laterality: N/A;   RIGHT HEART CATH N/A 02/02/2023   Procedure: RIGHT HEART CATH;  Surgeon: Mardell Shade, MD;  [facility];  Service: Cardiovascular;  Laterality: N/A;   RIGHT/LEFT HEART CATH AND CORONARY ANGIOGRAPHY N/A 01/27/2023   Procedure: RIGHT/LEFT HEART CATH AND CORONARY ANGIOGRAPHY;  Surgeon: Cody Das, MD;  [facility];  Service: Cardiovascular;  Laterality: N/A;   TEE WITHOUT CARDIOVERSION  N/A 02/04/2023   Procedure: TRANSESOPHAGEAL ECHOCARDIOGRAM;  Surgeon: Kyra Phy, MD;  Location: [facility] IN[facility];  Service: Open Heart Surgery;  Laterality: N/A;   TEMPORARY PACEMAKER N/A 02/02/2023   Procedure: TEMPORARY PACEMAKER;  Surgeon: Mardell Shade, MD;  [facility];  Service: Cardiovascular;  Laterality: N/A;   TRANSCATHETER AORTIC VALVE REPLACEMENT, TRANSFEMORAL N/A 02/04/2023   Procedure: Transcatheter Aortic Valve Replacement, Transfemoral;  Surgeon: Thukkani, Arun K, MD;  [facility]: [facility];  Service: Open Heart Surgery;  Laterality: N/A;   TUBAL LIGATION     VIDEO BRONCHOSCOPY Bilateral 11/10/2023   Procedure: VIDEO BRONCHOSCOPY WITHOUT FLUORO;  Surgeon: Mannam, Praveen, MD;  Location: [facility];  Service: Cardiopulmonary;  Laterality: Bilateral;   VIDEO BRONCHOSCOPY WITH ENDOBRONCHIAL ULTRASOUND N/A 11/10/2023   Procedure: BRONCHOSCOPY, WITH EBUS;  Surgeon: Phyllis Breeze, MD;  Location: [facility];  Service: Cardiopulmonary;  Laterality: N/A;   VULVA SURGERY     Multiple times for dysplasia   Past Medical History:  Diagnosis Date   Anxiety    Aortic stenosis    s/p TAVR 02/04/23    Arthritis    low back and hip pain intermittent   Breast cancer (HCC) 06/02/2007   r breast -surgery ,radiaology. chemotherapy   Carotid artery disease (HCC)    Carpal tunnel syndrome    right hand   CHF (congestive heart failure) (HCC)    Colon polyps    Complication of anesthesia    Fentanyl , Versed -makes extra hyper, bradycardia x 1 in PACU, [facility] (08/15/11 cardiology felt neostigmine may have resulted in AV nodal block)    Coronary artery disease    Depression    denies   DVT (deep venous thrombosis) (HCC)    provoked left IJ 02/10/23   Dysplasia of vulva    Hypertension    Palpitations    PSVT, s/p adenosine  08/04/16   S/P breast lumpectomy 07/04/2007   R breast   S/P radiation therapy 2009   Vulva cancer (HCC)    Ht 5\' 8"  (1.727 m)   Wt 195 lb (88.5 kg)   BMI 29.65 kg/m   Opioid Risk Score:   Fall Risk Score:  `1  Depression screen PHQ 2/9     11/17/2023    2:03 PM 08/02/2023    1:54 PM 07/26/2023    2:19 PM 07/05/2023    2:00 PM 05/10/2023   11:59 AM 01/19/2020    1:33 PM 10/04/2019    2:40 PM  Depression screen PHQ 2/9  Decreased Interest 0 0 1 0 0 0 0  Down, Depressed, Hopeless 0 0 0 0 0 0 0  PHQ - 2 Score 0 0 1 0 0 0 0  Altered sleeping 0  2  0    Tired, decreased energy 1  3  2     Change in appetite 0  1  0    Feeling bad or failure about yourself  0  0  0    Trouble concentrating 0  0  0    Moving slowly or fidgety/restless 0  0  0    Suicidal thoughts 0  0  0    PHQ-9 Score 1  7  2     Difficult doing work/chores Not difficult at all  Not difficult at all  Somewhat difficult       Review of Systems  Respiratory:  Negative for cough and wheezing.   Musculoskeletal:  Positive for back pain and neck pain.  Psychiatric/Behavioral:  The patient is not nervous/anxious.   All other systems reviewed and are negative.      Objective:   Physical Exam Gen: no distress, normal appearing HEENT: oral mucosa pink and moist, NCAT Cardio: Reg rate Chest:  normal effort, normal rate of breathing Abd: soft, non-distended Ext: no edema Psych: pleasant, normal affect Skin: intact Neuro: Alert and oriented x3 MSK: 5/5 strength throughout, TTP lower back, limited lumbar spine ROM, stable 6.9/25     Assessment & Plan:   1) Debility 2/2 CAD -discussed that she is thankful for her recovery -discussed that she has returned to work -discussed that se has established care with a new PCP -discussed that the cardiac rehab has been very helpful for her  2) Chronic cough/hemoptysis: -discussed that she followed up with her PCP -discussed that she had a CXR  -replaced pulmonology referral -discussed potential etiologies -discussed plans to stat pulmonary rehab -discussed that she had some hemoptysis yesterday, today none -discussed that she had a bronchoscopy -continue fruits and vegetables -discussed that autoimmune labs were ordered -reviewed CT chest and discussed that it shows pulmonary fibrosis  3) Cervical spine pain -discussed that flexed posture can contribute greatly -reviewed MRI results  D/c oxycodone  -continue heating pad, discussed this is good for improving blood flow  4) Muscle weakness: -discussed that she thinks this is 2/2 to her statin -recommended Coenzyme Q10, discussed that she used to take this in the past -continue Crestor  -discussed that she has aching in her legs and feels she has been run over by a truck  5) History of chest pain/CAD: -discussed her prior symptoms and stent placement -discussed that she benefited greatly from cardiac rehab  6) Desaturation: -saturation reviewed and is excellent -discussed that she feels well -continue follow-up with pulmonology  7) Chronic low back pain D/c oxycodone  -continue heating pad  8) Headaches:  -discussed that these have resolved since she stopped the Eliquis   9) Anxiety: -referred to psychiatrist Dr. Donnelly Gainer  40 minutes spent in discussing her  anxiety, CAD, neck and back pain, headaches, desaturation, cardiac and pulmonary debility, pulmonary fibrosis

## 2023-11-29 NOTE — Addendum Note (Signed)
 Addended by: Liam Redhead on: 11/29/2023 02:16 PM   Modules accepted: Level of Service

## 2023-11-29 NOTE — Addendum Note (Signed)
 Addended by: Liam Redhead on: 11/29/2023 02:11 PM   Modules accepted: Orders

## 2023-11-30 ENCOUNTER — Telehealth (HOSPITAL_COMMUNITY): Payer: Self-pay

## 2023-11-30 NOTE — Telephone Encounter (Signed)
 Called to confirm appt. Pt confirmed appt. Instructed pt on proper footwear. Gave directions along with department number.

## 2023-12-01 ENCOUNTER — Encounter (HOSPITAL_COMMUNITY): Payer: Self-pay

## 2023-12-01 ENCOUNTER — Encounter (HOSPITAL_COMMUNITY)
Admission: RE | Admit: 2023-12-01 | Discharge: 2023-12-01 | Disposition: A | Discharge status: Home / Self Care | Source: Ambulatory Visit | Attending: Cardiology | Admitting: Cardiology

## 2023-12-01 VITALS — BP 100/58 | HR 85 | Ht 68.0 in | Wt 194.7 lb

## 2023-12-01 DIAGNOSIS — R0609 Other forms of dyspnea: Secondary | ICD-10-CM | POA: Insufficient documentation

## 2023-12-01 NOTE — Progress Notes (Signed)
 Pulmonary Individual Treatment Plan  Patient Details  Name: Madison West MRN: [id number] Date of Birth: 1955-09-06 Referring Provider:   Gattis Kass Pulmonary Rehab Walk Test from 12/01/2023 in [facility]  Referring Provider Mannam       Initial Encounter Date:  Flowsheet Row Pulmonary Rehab Walk Test from 12/01/2023 in [facility]  Date 12/01/23       Visit Diagnosis: DOE (dyspnea on exertion)  Patient's Home Medications on Admission:   Current Outpatient Medications:    acetaminophen  (TYLENOL ) 325 MG tablet, Take 2 tablets (650 mg total) by mouth every 6 (six) hours. (Patient taking differently: Take 500 mg by mouth every 6 (six) hours.), Disp: , Rfl:    alprazolam  (XANAX ) 2 MG tablet, Take 0.25-2 mg by mouth See admin instructions. 1 mg (may take up to 2 mg) twice daily. May also take 0.25 mg during the day if needed for anxiety., Disp: , Rfl:    aspirin  EC 81 MG tablet, Take 1 tablet (81 mg total) by mouth daily. Swallow whole., Disp: , Rfl:    busPIRone  (BUSPAR ) 10 MG tablet, TAKE 1 TABLET BY MOUTH TWICE A DAY (Patient taking differently: Take 5 mg by mouth 2 (two) times daily.), Disp: 180 tablet, Rfl: 1   clopidogrel  (PLAVIX ) 75 MG tablet, Take 1 tablet (75 mg total) by mouth daily., Disp: 90 tablet, Rfl: 3   dapagliflozin  propanediol (FARXIGA ) 10 MG TABS tablet, Take 1 tablet (10 mg total) by mouth daily before breakfast., Disp: 90 tablet, Rfl: 3   furosemide  (LASIX ) 20 MG tablet, Take 0.5 tablets (10 mg total) by mouth daily as needed (for lower extremity swelling and weight gain)., Disp: 45 tablet, Rfl: 1   losartan  (COZAAR ) 25 MG tablet, Take 1 tablet (25 mg total) by mouth daily., Disp: 15 tablet, Rfl: 0   methocarbamol  (ROBAXIN ) 500 MG tablet, Take 1 tablet (500 mg total) by mouth 2 (two) times daily., Disp: 60 tablet, Rfl: 0   nitroGLYCERIN   (NITROSTAT ) 0.4 MG SL tablet, Place 1 tablet (0.4 mg total) under the tongue every 5 (five) minutes as needed for chest pain., Disp: 25 tablet, Rfl: 2   oxyCODONE  (ROXICODONE ) 5 MG immediate release tablet, Take 1 tablet (5 mg total) by mouth daily as needed for severe pain (pain score 7-10)., Disp: 30 tablet, Rfl: 0   rosuvastatin  (CRESTOR ) 20 MG tablet, Take 0.5 tablets (10 mg total) by mouth daily., Disp: , Rfl:    spironolactone  (ALDACTONE ) 25 MG tablet, Take 0.5 tablets (12.5 mg total) by mouth daily., Disp: 45 tablet, Rfl: 3  Past Medical History: Past Medical History:  Diagnosis Date   Anxiety    Aortic stenosis    s/p TAVR 02/04/23   Arthritis    low back and hip pain intermittent   Breast cancer (HCC) 06/02/2007   r breast -surgery ,radiaology. chemotherapy   Carotid artery disease (HCC)    Carpal tunnel syndrome    right hand   CHF (congestive heart failure) (HCC)    Colon polyps    Complication of anesthesia    Fentanyl , Versed -makes extra hyper, bradycardia x 1 in PACU, [facility] (08/15/11 cardiology felt neostigmine may have resulted in AV nodal block)    Coronary artery disease    Depression    denies   DVT (deep venous thrombosis) (HCC)    provoked left IJ 02/10/23   Dysplasia of vulva  Hypertension    Palpitations    PSVT, s/p adenosine  08/04/16   S/P breast lumpectomy 07/04/2007   R breast   S/P radiation therapy 2009   Vulva cancer (HCC)     Tobacco Use: Social History   Tobacco Use  Smoking Status Former   Current packs/day: 0.00   Average packs/day: 0.8 packs/day for 20.0 years (15.0 ttl pk-yrs)   Types: Cigarettes   Start date: 05/06/1977   Quit date: 05/06/1997   Years since quitting: 26.5  Smokeless Tobacco Never    Labs: Review Flowsheet  More data exists      Latest Ref Rng & Units 02/12/2023 02/13/2023 02/14/2023 06/21/2023 09/02/2023  Labs for ITP Cardiac and Pulmonary Rehab  Cholestrol 100 - 199 mg/dL - - - 295  621   LDL (calc) 0 - 99 mg/dL  - - - 94  71   HDL-C >30 mg/dL - - - 67  54   Trlycerides 0 - 149 mg/dL - - - 865  99   O2 Saturation % 76.1  68.3  60.4  - -    Capillary Blood Glucose: Lab Results  Component Value Date   GLUCAP 87 02/19/2023   GLUCAP 103 (H) 02/19/2023   GLUCAP 121 (H) 02/18/2023   GLUCAP 111 (H) 02/18/2023   GLUCAP 110 (H) 02/17/2023     Pulmonary Assessment Scores:  Pulmonary Assessment Scores     Row Name 12/01/23 1314         ADL UCSD   ADL Phase Entry     SOB Score total 28       CAT Score   CAT Score 10       mMRC Score   mMRC Score 2             UCSD: Self-administered rating of dyspnea associated with activities of daily living (ADLs) 6-point scale (0 = not at all to 5 = maximal or unable to do because of breathlessness)  Scoring Scores range from 0 to 120.  Minimally important difference is 5 units  CAT: CAT can identify the health impairment of COPD patients and is better correlated with disease progression.  CAT has a scoring range of zero to 40. The CAT score is classified into four groups of low (less than 10), medium (10 - 20), high (21-30) and very high (31-40) based on the impact level of disease on health status. A CAT score over 10 suggests significant symptoms.  A worsening CAT score could be explained by an exacerbation, poor medication adherence, poor inhaler technique, or progression of COPD or comorbid conditions.  CAT MCID is 2 points  mMRC: mMRC (Modified Medical Research Council) Dyspnea Scale is used to assess the degree of baseline functional disability in patients of respiratory disease due to dyspnea. No minimal important difference is established. A decrease in score of 1 point or greater is considered a positive change.   Pulmonary Function Assessment:  Pulmonary Function Assessment - 12/01/23 1522       Breath   Bilateral Breath Sounds Decreased    Shortness of Breath Yes;Limiting activity             Exercise Target  Goals: Exercise Program Goal: Individual exercise prescription set using results from initial 6 min walk test and THRR while considering  patient's activity barriers and safety.   Exercise Prescription Goal: Initial exercise prescription builds to 30-45 minutes a day of aerobic activity, 2-3 days per week.  Home exercise guidelines will be  given to patient during program as part of exercise prescription that the participant will acknowledge.  Activity Barriers & Risk Stratification:  Activity Barriers & Cardiac Risk Stratification - 12/01/23 1312       Activity Barriers & Cardiac Risk Stratification   Activity Barriers Deconditioning;Back Problems;Neck/Spine Problems;Balance Concerns;Decreased Ventricular Function;Joint Problems;Muscular Weakness    Cardiac Risk Stratification --             6 Minute Walk:  6 Minute Walk     Row Name 07/26/23 1619 11/01/23 1300 12/01/23 1439     6 Minute Walk   Phase Initial Discharge Initial   Distance 1180 feet 1298 feet 1170 feet   Distance % Change -- 10 % --   Distance Feet Change -- 118 ft --   Walk Time 6 minutes 6 minutes 6 minutes   # of Rest Breaks 1  2:20-3:20 3/10 hip pain 0 0   MPH 2.23 2.5 2.22   METS 2.6 2.91 2.81   RPE 10 13 12    Perceived Dyspnea  1 1 1    VO2 Peak 9.11 10.2 9.83   Symptoms Yes (comment) Yes (comment) Yes (comment)  c/o 3/10 hip pain with distance   Comments Pt SpO2 down to 84%, recovered to 94% in around a minute and maintained. RPD = 1, resolved with rest. 6/10 Hip pain at end of SaO2 dropped to 88% on 2L @ 4 minutes-, increased to 3L. Pt had mils SOB, RPD = 1.  2/10 bilateral leg pain --   Resting HR 68 bpm 81 bpm 85 bpm   Resting BP 112/60 120/60 100/58   Resting Oxygen Saturation  92 % 90 % 97 %   Exercise Oxygen Saturation  during 6 min walk 84 % 88 % 87 %   Max Ex. HR 100 bpm 108 bpm 112 bpm   Max Ex. BP 136/66 140/60 146/70   2 Minute Post BP 120/62 -- 130/70     Interval HR   1 Minute HR  -- -- 99   2 Minute HR -- -- 104   3 Minute HR -- -- 109   4 Minute HR -- -- 112   5 Minute HR -- -- 112   6 Minute HR -- -- 112   2 Minute Post HR -- -- 93   Interval Heart Rate? -- -- Yes     Interval Oxygen   Interval Oxygen? -- Yes Yes   Baseline Oxygen Saturation % -- -- 97 %   1 Minute Oxygen Saturation % -- 93 % 89 %   1 Minute Liters of Oxygen -- 2 L 0 L   2 Minute Oxygen Saturation % -- 89 % 91 %  87 RA 1:40, up to 1L   2 Minute Liters of Oxygen -- 2 L 1 L   3 Minute Oxygen Saturation % -- 89 % 93 %   3 Minute Liters of Oxygen -- 2 L 1 L   4 Minute Oxygen Saturation % -- 88 % 92 %   4 Minute Liters of Oxygen -- 2 L  increased to 3L 1 L   5 Minute Oxygen Saturation % -- 91 % 93 %   5 Minute Liters of Oxygen -- 3 L 1 L   6 Minute Oxygen Saturation % -- 91 % 92 %   6 Minute Liters of Oxygen -- 3 L 1 L   2 Minute Post Oxygen Saturation % -- 93 % 97 %   2  Minute Post Liters of Oxygen -- 2 L 1 L            Oxygen Initial Assessment:  Oxygen Initial Assessment - 12/01/23 1313       Home Oxygen   Home Oxygen Device Home Concentrator;E-Tanks    Sleep Oxygen Prescription Continuous    Liters per minute 4    Home Exercise Oxygen Prescription Continuous    Liters per minute 4    Home Resting Oxygen Prescription Continuous    Liters per minute 4    Compliance with Home Oxygen Use No      Initial 6 min Walk   Oxygen Used Continuous    Liters per minute 1      Program Oxygen Prescription   Program Oxygen Prescription Continuous    Liters per minute 1      Intervention   Short Term Goals To learn and exhibit compliance with exercise, home and travel O2 prescription;To learn and understand importance of maintaining oxygen saturations>88%;To learn and understand importance of monitoring SPO2 with pulse oximeter and demonstrate accurate use of the pulse oximeter.;To learn and demonstrate proper pursed lip breathing techniques or other breathing techniques.     Long   Term Goals Exhibits compliance with exercise, home  and travel O2 prescription;Maintenance of O2 saturations>88%;Verbalizes importance of monitoring SPO2 with pulse oximeter and return demonstration;Exhibits proper breathing techniques, such as pursed lip breathing or other method taught during program session             Oxygen Re-Evaluation:   Oxygen Discharge (Final Oxygen Re-Evaluation):   Initial Exercise Prescription:  Initial Exercise Prescription - 12/01/23 1400       Date of Initial Exercise RX and Referring Provider   Date 12/01/23    Referring Provider Mannam    Expected Discharge Date 02/29/24      Oxygen   Oxygen Continuous    Liters 1    Maintain Oxygen Saturation 88% or higher      Bike   Level 2    Watts 70    Minutes 15    METs 2.2      Track   Laps 8    Minutes 15    METs 2.2      Prescription Details   Frequency (times per week) 2    Duration Progress to 30 minutes of continuous aerobic without signs/symptoms of physical distress      Intensity   THRR 40-80% of Max Heartrate 60-120    Ratings of Perceived Exertion 11-13    Perceived Dyspnea 0-4      Progression   Progression Continue to progress workloads to maintain intensity without signs/symptoms of physical distress.      Resistance Training   Training Prescription Yes    Weight blue bands    Reps 10-15             Perform Capillary Blood Glucose checks as needed.  Exercise Prescription Changes:   Exercise Prescription Changes     Row Name 07/28/23 1400 08/18/23 1500 09/13/23 1500 09/22/23 1400 09/27/23 1500     Response to Exercise   Blood Pressure (Admit) 128/68 122/70 106/70 112/70 108/62   Blood Pressure (Exercise) 138/78 144/70 -- 114/64 120/70   Blood Pressure (Exit) 112/70 100/70 94/60 94/60  91/52   Heart Rate (Admit) 75 bpm 82 bpm 73 bpm 74 bpm 76 bpm   Heart Rate (Exercise) 102 bpm 107 bpm 89 bpm 91 bpm 90 bpm   Heart Rate (Exit)  81 bpm 81 bpm 78 bpm 76 bpm 82  bpm   Oxygen Saturation (Admit) 87 % -- 91 % 94 % 76 %   Oxygen Saturation (Exercise) 89 % 91 %  2 L O2 via n/c 92 % 93 % 90 %   Oxygen Saturation (Exit) 94 % 93 % 90 % 92 % 82 %   Rating of Perceived Exertion (Exercise) 11 13 11 12 11    Perceived Dyspnea (Exercise) 0.5 1 1 1 1    Symptoms SOB SOB SOB, RPD = 1 SOB, RPD = 1 SOB, RPD = 1, lightheaded after exercise   Comments Pt's first day in the CRP2 program Reviewed METs Reviewed METs and goals Reviewed home exercise Rx Reviewed METs   Duration Continue with 30 min of aerobic exercise without signs/symptoms of physical distress. Continue with 30 min of aerobic exercise without signs/symptoms of physical distress. Continue with 30 min of aerobic exercise without signs/symptoms of physical distress. Continue with 30 min of aerobic exercise without signs/symptoms of physical distress. Continue with 30 min of aerobic exercise without signs/symptoms of physical distress.   Intensity THRR unchanged THRR unchanged THRR unchanged THRR unchanged THRR unchanged     Progression   Progression Continue to progress workloads to maintain intensity without signs/symptoms of physical distress. Continue to progress workloads to maintain intensity without signs/symptoms of physical distress. Continue to progress workloads to maintain intensity without signs/symptoms of physical distress. Continue to progress workloads to maintain intensity without signs/symptoms of physical distress. Continue to progress workloads to maintain intensity without signs/symptoms of physical distress.   Average METs 2.15 2.4 2.4 2.6 2.7     Resistance Training   Training Prescription No No Yes No Yes   Weight No weights on wednesdays No weights on wednesdays 3 lb No weights on wednesdays 2 lbs   Reps -- -- 10-15 -- 10-15   Time -- -- 10 Minutes -- 10 Minutes     Interval Training   Interval Training No No No No No     Recumbant Bike   Level 1 1 1 2 2    RPM 62 70 77 69 66   Watts  16 16 21 22 18    Minutes 15 15 15 15 15    METs 2 2 2.2 2.5 2.2     NuStep   Level 1 1 1 2 2    SPM 84 95 85 91 100   Minutes 15 15 15 15 15    METs 2.3 2.8 2.6 2.7 3.2     Home Exercise Plan   Plans to continue exercise at -- -- -- Home (comment) Home (comment)   Frequency -- -- -- Add 2 additional days to program exercise sessions. Add 2 additional days to program exercise sessions.   Initial Home Exercises Provided -- -- -- 09/22/23 09/22/23    Row Name 10/11/23 1600 10/25/23 1700 11/17/23 1400         Response to Exercise   Blood Pressure (Admit) 100/56 110/78 128/62     Blood Pressure (Exit) 120/54 110/60 100/62     Heart Rate (Admit) 86 bpm 75 bpm 81 bpm     Heart Rate (Exercise) 103 bpm 92 bpm 103 bpm     Heart Rate (Exit) 86 bpm 84 bpm 89 bpm     Oxygen Saturation (Admit) 91 % 92 % 95 %     Oxygen Saturation (Exercise) 90 % 90 % 95 %     Oxygen Saturation (Exit) 89 % 91 % 93 %  Rating of Perceived Exertion (Exercise) 13 11 12      Perceived Dyspnea (Exercise) 1 1 0     Symptoms SOB, RPD = 1, lightheaded after exercise SOB, RPD = 1, lightheaded after exercise None     Comments Reviewed METs and goals Reviewed METs Pt last day in program     Duration Continue with 30 min of aerobic exercise without signs/symptoms of physical distress. Continue with 30 min of aerobic exercise without signs/symptoms of physical distress. Continue with 30 min of aerobic exercise without signs/symptoms of physical distress.     Intensity THRR unchanged THRR unchanged THRR unchanged       Progression   Progression Continue to progress workloads to maintain intensity without signs/symptoms of physical distress. Continue to progress workloads to maintain intensity without signs/symptoms of physical distress. Continue to progress workloads to maintain intensity without signs/symptoms of physical distress.     Average METs 2.75 2.6 2.5       Resistance Training   Training Prescription Yes Yes No      Weight 2 lbs 2 lbs No weights on Wed     Reps 10-15 10-15 --     Time 10 Minutes 5 Minutes --       Interval Training   Interval Training No No No       Oxygen   Oxygen -- -- Continuous     Liters -- -- 3       Recumbant Bike   Level 2 2 2      RPM 79 19 70     Watts -- 61 15     Minutes 15 15 15      METs 2.2 2.1 2       NuStep   Level 2 2 3      SPM 104 99 97     Minutes 15 15 15      METs 3.3 3 3        Home Exercise Plan   Plans to continue exercise at Home (comment) Home (comment) --     Frequency Add 2 additional days to program exercise sessions. Add 2 additional days to program exercise sessions. --     Initial Home Exercises Provided 09/22/23 09/22/23 --              Exercise Comments:   Exercise Comments     Row Name 07/28/23 1415 08/18/23 1400 09/13/23 1500 09/22/23 1431 09/27/23 1500   Exercise Comments Pt's first day in the CRP2 program. Pt did have some mild SOB with exercise (RPD = .5). Will continue to montior and progress as tolerated. Reviwed METs. Pt is making slow progress. Order for O2 with exercise was obtained due to patient's drop in SaO2 during exercise. Reviewed METs and goals. Slow progress. Will encourage increase in exercise workloads. Reviewed home exercise Rx. Pt would like to do pool exercise but pools are not open yet. Pt given some links for chair exercise that she can do at home. Pt verbalized understanding of the HERx and was provided a copy. Reviewed METs. Slow progression. Pt lightheaded after exercise. Had not eaten much today. H2) encouraged, given graham crarkers and PNB. Felt fine on D/C.    Row Name 10/25/23 1706 11/17/23 1432         Exercise Comments Reviewed METs. Pt feeling good about her appt with the pulmonologist today. Slow progression. Pt last day in CR, pt plans to go into PR to further streghthen her lungs and increase her stamina. Post QOL  scores and knowledge scores could not assessed as she did not bring in her  paperwork.               Exercise Goals and Review:   Exercise Goals     Row Name 07/26/23 1623 12/01/23 1313           Exercise Goals   Increase Physical Activity Yes Yes      Intervention Provide advice, education, support and counseling about physical activity/exercise needs.;Develop an individualized exercise prescription for aerobic and resistive training based on initial evaluation findings, risk stratification, comorbidities and participant's personal goals. Provide advice, education, support and counseling about physical activity/exercise needs.;Develop an individualized exercise prescription for aerobic and resistive training based on initial evaluation findings, risk stratification, comorbidities and participant's personal goals.      Expected Outcomes Short Term: Attend rehab on a regular basis to increase amount of physical activity.;Long Term: Exercising regularly at least 3-5 days a week.;Long Term: Add in home exercise to make exercise part of routine and to increase amount of physical activity. Short Term: Attend rehab on a regular basis to increase amount of physical activity.;Long Term: Exercising regularly at least 3-5 days a week.;Long Term: Add in home exercise to make exercise part of routine and to increase amount of physical activity.      Increase Strength and Stamina Yes Yes      Intervention Develop an individualized exercise prescription for aerobic and resistive training based on initial evaluation findings, risk stratification, comorbidities and participant's personal goals.;Provide advice, education, support and counseling about physical activity/exercise needs. Develop an individualized exercise prescription for aerobic and resistive training based on initial evaluation findings, risk stratification, comorbidities and participant's personal goals.;Provide advice, education, support and counseling about physical activity/exercise needs.      Expected Outcomes  Short Term: Increase workloads from initial exercise prescription for resistance, speed, and METs.;Short Term: Perform resistance training exercises routinely during rehab and add in resistance training at home;Long Term: Improve cardiorespiratory fitness, muscular endurance and strength as measured by increased METs and functional capacity ( ) Short Term: Increase workloads from initial exercise prescription for resistance, speed, and METs.;Short Term: Perform resistance training exercises routinely during rehab and add in resistance training at home;Long Term: Improve cardiorespiratory fitness, muscular endurance and strength as measured by increased METs and functional capacity ( )      Able to understand and use rate of perceived exertion (RPE) scale Yes Yes      Intervention Provide education and explanation on how to use RPE scale Provide education and explanation on how to use RPE scale      Expected Outcomes Short Term: Able to use RPE daily in rehab to express subjective intensity level;Long Term:  Able to use RPE to guide intensity level when exercising independently Short Term: Able to use RPE daily in rehab to express subjective intensity level;Long Term:  Able to use RPE to guide intensity level when exercising independently      Able to understand and use Dyspnea scale -- Yes      Intervention -- Provide education and explanation on how to use Dyspnea scale      Expected Outcomes -- Short Term: Able to use Dyspnea scale daily in rehab to express subjective sense of shortness of breath during exertion;Long Term: Able to use Dyspnea scale to guide intensity level when exercising independently      Knowledge and understanding of Target Heart Rate Range (THRR) Yes Yes      Intervention  Provide education and explanation of THRR including how the numbers were predicted and where they are located for reference Provide education and explanation of THRR including how the numbers were predicted and  where they are located for reference      Expected Outcomes Short Term: Able to state/look up THRR;Short Term: Able to use daily as guideline for intensity in rehab;Long Term: Able to use THRR to govern intensity when exercising independently Short Term: Able to state/look up THRR;Short Term: Able to use daily as guideline for intensity in rehab;Long Term: Able to use THRR to govern intensity when exercising independently      Understanding of Exercise Prescription Yes Yes      Intervention Provide education, explanation, and written materials on patient's individual exercise prescription Provide education, explanation, and written materials on patient's individual exercise prescription      Expected Outcomes Short Term: Able to explain program exercise prescription;Long Term: Able to explain home exercise prescription to exercise independently Short Term: Able to explain program exercise prescription;Long Term: Able to explain home exercise prescription to exercise independently               Exercise Goals Re-Evaluation :  Exercise Goals Re-Evaluation     Row Name 07/28/23 1414 09/13/23 1500           Exercise Goal Re-Evaluation   Exercise Goals Review Increase Physical Activity;Increase Strength and Stamina;Able to understand and use rate of perceived exertion (RPE) scale;Knowledge and understanding of Target Heart Rate Range (THRR);Understanding of Exercise Prescription Increase Physical Activity;Increase Strength and Stamina;Able to understand and use rate of perceived exertion (RPE) scale;Knowledge and understanding of Target Heart Rate Range (THRR);Understanding of Exercise Prescription      Comments Pt's first day in the CRP2 program. Pt understands the exercise RX, RPE sclae and THRR. Reviewed METs and goals. Pt voices progress on her goals of increased strength, stamina and energy. Pt has goal of weight loss and has lost 5.6 kg to date.      Expected Outcomes Will continue to monitor  patient and progress exercise workloads as tolerated. Will continue to monitor patient and progress exercise workloads as tolerated.               Discharge Exercise Prescription (Final Exercise Prescription Changes):  Exercise Prescription Changes - 11/17/23 1400       Response to Exercise   Blood Pressure (Admit) 128/62    Blood Pressure (Exit) 100/62    Heart Rate (Admit) 81 bpm    Heart Rate (Exercise) 103 bpm    Heart Rate (Exit) 89 bpm    Oxygen Saturation (Admit) 95 %    Oxygen Saturation (Exercise) 95 %    Oxygen Saturation (Exit) 93 %    Rating of Perceived Exertion (Exercise) 12    Perceived Dyspnea (Exercise) 0    Symptoms None    Comments Pt last day in program    Duration Continue with 30 min of aerobic exercise without signs/symptoms of physical distress.    Intensity THRR unchanged      Progression   Progression Continue to progress workloads to maintain intensity without signs/symptoms of physical distress.    Average METs 2.5      Resistance Training   Training Prescription No    Weight No weights on Wed      Interval Training   Interval Training No      Oxygen   Oxygen Continuous    Liters 3  Recumbant Bike   Level 2    RPM 70    Watts 15    Minutes 15    METs 2      NuStep   Level 3    SPM 97    Minutes 15    METs 3             Nutrition:  Target Goals: Understanding of nutrition guidelines, daily intake of sodium 1500mg , cholesterol 200mg , calories 30% from fat and 7% or less from saturated fats, daily to have 5 or more servings of fruits and vegetables.  Biometrics:  Pre Biometrics - 12/01/23 1442       Pre Biometrics   Grip Strength 21 kg             Post Biometrics - 11/01/23 1704        Post  Biometrics   Height 5' 8 (1.727 m)    Weight 89.7 kg    Waist Circumference 47 inches    Hip Circumference 51 inches    Waist to Hip Ratio 0.92 %    BMI (Calculated) 30.08    Triceps Skinfold 14 mm    % Body  Fat 40.4 %    Grip Strength 20 kg    Flexibility 22 in    Single Leg Stand 18.57 seconds             Nutrition Therapy Plan and Nutrition Goals:  Nutrition Therapy & Goals - 11/17/23 1536       Nutrition Therapy   Diet Heart Healthy Diet    Drug/Food Interactions Statins/Certain Fruits      Personal Nutrition Goals   Nutrition Goal Patient to identify strategies for reducing cardiovascular risk by attending the [employer] education and nutrition series weekly.   goal in progress.   Personal Goal #2 Patient to improve diet quality by using the plate method as a guide for meal planning to include lean protein/plant protein, fruits, vegetables, whole grains, nonfat dairy as part of a well-balanced diet.   goal in progress.   Personal Goal #3 Patient to identify strategies for weight loss of 0.5-2.0# per week.   goal in progress.   Comments Goals in progress. Madison West has medical history of hyperlipidemia, s/p TAVR, STEMI, CHF, HTN, CAD, carotid artery stenosis. She has attended  the [employer] education and nutrition series regularly. She is motivated to lose weight to 170#; she is down 6.6# since starting with our program. LDL has improved to 71; she continues zetia , crestor  and heart healthy diet modifications. She continues follow-up with pulmonolgy regarding hemoptysis and plans to start pulmonary rehab. Patient will benefit from adherence to nutrition, exercise, and lifestyle modification.      Intervention Plan   Intervention Prescribe, educate and counsel regarding individualized specific dietary modifications aiming towards targeted core components such as weight, hypertension, lipid management, diabetes, heart failure and other comorbidities.;Nutrition handout(s) given to patient.    Expected Outcomes Short Term Goal: Understand basic principles of dietary content, such as calories, fat, sodium, cholesterol and nutrients.;Long Term Goal: Adherence to prescribed nutrition plan.              Nutrition Assessments:  MEDIFICTS Score Key: >=70 Need to make dietary changes  40-70 Heart Healthy Diet <= 40 Therapeutic Level Cholesterol Diet   Picture Your Plate Scores: <65 Unhealthy dietary pattern with much room for improvement. 41-50 Dietary pattern unlikely to meet recommendations for good health and room for improvement. 51-60 More healthful dietary pattern, with  some room for improvement.  >60 Healthy dietary pattern, although there may be some specific behaviors that could be improved.    Nutrition Goals Re-Evaluation:  Nutrition Goals Re-Evaluation     Row Name 08/24/23 1018 09/24/23 1040 10/27/23 0929 11/17/23 1536       Goals   Current Weight 202 lb 6.1 oz (91.8 kg) 199 lb 1.2 oz (90.3 kg) 196 lb 3.4 oz (89 kg) 196 lb 3.4 oz (89 kg)    Comment LDL 94, Lpa 227 LDL 71, HDL WNL, Lpa 227 no new labs; most recent labs  LDL 71, HDL WNL, Lpa 227 most recent labs LDL 71, HDL WNL, Lpa 227    Expected Outcome Goals in progress. Madison West has medical history of hyperlipidemia, s/p TAVR, STEMI, CHF, HTN, CAD, carotid artery stenosis. She continues to attend the [employer] education and nutrition series regularly. She is motivated to lose weight to 170#; however, she has not lost weight since starting with our program. LDL remains above goal; she continues zetia , crestor  and heart healthy diet modifications. Patient will benefit from participation in intensive cardiac rehab for nutrition, exercise, and lifestyle modification. Goals in progress. Madison West has medical history of hyperlipidemia, s/p TAVR, STEMI, CHF, HTN, CAD, carotid artery stenosis. She continues to attend the [employer] education and nutrition series regularly. She is motivated to lose weight to 170#; she is down 3.7# since starting with our program. LDL has improved to 71; she continues zetia , crestor  and heart healthy diet modifications. Patient will benefit from participation in intensive cardiac rehab for nutrition, exercise,  and lifestyle modification.90.3kg Goals in progress. Madison West has medical history of hyperlipidemia, s/p TAVR, STEMI, CHF, HTN, CAD, carotid artery stenosis. She continues to attend the [employer] education and nutrition series regularly. She is motivated to lose weight to 170#; she is down 6.6# since starting with our program. LDL has improved to 71; she continues zetia , crestor  and heart healthy diet modifications. She continues follow-up with pulmonolgy regarding hemoptysis. Patient will benefit from participation in intensive cardiac rehab for nutrition, exercise, and lifestyle modification. Goals in progress. Madison West has medical history of hyperlipidemia, s/p TAVR, STEMI, CHF, HTN, CAD, carotid artery stenosis. She has attended the [employer] education and nutrition series regularly. She is motivated to lose weight to 170#; she is down 6.6# since starting with our program. LDL has improved to 71; she continues zetia , crestor  and heart healthy diet modifications. She continues follow-up with pulmonolgy regarding hemoptysis and plans to start pulmonary rehab. Patient will benefit from adherence to nutrition, exercise, and lifestyle modification.             Nutrition Goals Discharge (Final Nutrition Goals Re-Evaluation):  Nutrition Goals Re-Evaluation - 11/17/23 1536       Goals   Current Weight 196 lb 3.4 oz (89 kg)    Comment most recent labs LDL 71, HDL WNL, Lpa 227    Expected Outcome Goals in progress. Madison West has medical history of hyperlipidemia, s/p TAVR, STEMI, CHF, HTN, CAD, carotid artery stenosis. She has attended the [employer] education and nutrition series regularly. She is motivated to lose weight to 170#; she is down 6.6# since starting with our program. LDL has improved to 71; she continues zetia , crestor  and heart healthy diet modifications. She continues follow-up with pulmonolgy regarding hemoptysis and plans to start pulmonary rehab. Patient will benefit from adherence to nutrition, exercise,  and lifestyle modification.             Psychosocial: Target Goals: Acknowledge presence or  absence of significant depression and/or stress, maximize coping skills, provide positive support system. Participant is able to verbalize types and ability to use techniques and skills needed for reducing stress and depression.  Initial Review & Psychosocial Screening:  Initial Psych Review & Screening - 12/01/23 1316       Initial Review   Current issues with Current Anxiety/Panic;Current Psychotropic Meds;Current Stress Concerns    Source of Stress Concerns Chronic Illness;Family    Comments Madison West stated she is concerned about her health declining, has stress related to her family      Family Dynamics   Good Support System? Yes    Comments Pt has daughters as support      Barriers   Psychosocial barriers to participate in program The patient should benefit from training in stress management and relaxation.      Screening Interventions   Interventions Encouraged to exercise;Provide feedback about the scores to participant    Expected Outcomes Short Term goal: Utilizing psychosocial counselor, staff and physician to assist with identification of specific Stressors or current issues interfering with healing process. Setting desired goal for each stressor or current issue identified.;Long Term Goal: Stressors or current issues are controlled or eliminated.;Short Term goal: Identification and review with participant of any Quality of Life or Depression concerns found by scoring the questionnaire.;Long Term goal: The participant improves quality of Life and PHQ9 Scores as seen by post scores and/or verbalization of changes             Quality of Life Scores:  Quality of Life - 11/18/23 0727       Quality of Life Scores   Health/Function Pre 20 %   Post QOL scores and knowledge scores could not assessed as she did not bring in her paperwork.   Socioeconomic Pre 26.57 %    Psych/Spiritual  Pre 18.86 %    Family Pre 18 %    GLOBAL Pre 21.1 %            Scores of 19 and below usually indicate a poorer quality of life in these areas.  A difference of  2-3 points is a clinically meaningful difference.  A difference of 2-3 points in the total score of the Quality of Life Index has been associated with significant improvement in overall quality of life, self-image, physical symptoms, and general health in studies assessing change in quality of life.  PHQ-9: Review Flowsheet  More data exists      12/01/2023 11/17/2023 08/02/2023 07/26/2023 07/05/2023  Depression screen PHQ 2/9  Decreased Interest 0 0 0 1 0  Down, Depressed, Hopeless 0 0 0 0 0  PHQ - 2 Score 0 0 0 1 0  Altered sleeping 1 0 - 2 -  Tired, decreased energy 1 1 - 3 -  Change in appetite 1 0 - 1 -  Feeling bad or failure about yourself  0 0 - 0 -  Trouble concentrating 0 0 - 0 -  Moving slowly or fidgety/restless 0 0 - 0 -  Suicidal thoughts 0 0 - 0 -  PHQ-9 Score 3 1 - 7 -  Difficult doing work/chores Not difficult at all Not difficult at all - Not difficult at all -   Interpretation of Total Score  Total Score Depression Severity:  1-4 = Minimal depression, 5-9 = Mild depression, 10-14 = Moderate depression, 15-19 = Moderately severe depression, 20-27 = Severe depression   Psychosocial Evaluation and Intervention:  Psychosocial Evaluation - 12/01/23 1318  Psychosocial Evaluation & Interventions   Interventions Encouraged to exercise with the program and follow exercise prescription    Comments Madison West has an extensive mental health history. She has been under the care of a psychiatrist and pain management MD since she was in her 73s. She continues to see both doctors' regularly. Madison West stated she is concerned about her health declining and has stress related to her family. She declines any additional needs, resources, or referrals currently.    Expected Outcomes For Madison West to participate in [facility] free of any  barriers or concerns    Continue Psychosocial Services  No Follow up required             Psychosocial Re-Evaluation:  Psychosocial Re-Evaluation     Row Name 08/11/23 [phone] 08/19/23 1534 09/21/23 0955 10/12/23 0905 11/09/23 1602     Psychosocial Re-Evaluation   Current issues with Current Stress Concerns;Current Anxiety/Panic Current Stress Concerns;Current Anxiety/Panic Current Stress Concerns;Current Anxiety/Panic Current Stress Concerns;Current Anxiety/Panic Current Stress Concerns;Current Anxiety/Panic   Comments Reviewed quality of life and PHq2-9.Madison West says she is dissatisfied with her health due to her recent MI, Stenting and TAVR. Madison West says that her adult children who live with her do not help as they should. Madison West denies being depressed currently. Madison West takes xanax  for anxiety. Madison West says that her anxiety is controlled. Madison West sees a psychiatrist once a year.  Offered emotional support and reassurance. Madison West continues to voice having family stress with her live in children. Madison West also reports having stress about her respitory status as she is now wearing oxygen during exercise. Emotional support provided. Madison West continues to voice having family stress with her live in children. Emotional support provided Madison West continues to voice having family stress with her live in children. Emotional support provided. Madison West would benifit from couseling. Madison West says she is not interested at this time. Madison West continues to voice concerns about her  upcoming procedure on 11/17/23 due to hemoptysis.   Expected Outcomes Madison West will have decreased or controlled anxiety/ stressors upon completion of cardiac rehab Madison West will have decreased or controlled anxiety/ stressors upon completion of cardiac rehab Madison West will have decreased or controlled anxiety/ stressors upon completion of cardiac rehab Madison West will have decreased or controlled anxiety/ stressors upon completion of cardiac rehab Madison West will have decreased or controlled anxiety/ stressors upon  completion of cardiac rehab   Interventions Stress management education;Relaxation education;Encouraged to attend Cardiac Rehabilitation for the exercise Stress management education;Relaxation education;Encouraged to attend Cardiac Rehabilitation for the exercise Stress management education;Relaxation education;Encouraged to attend Cardiac Rehabilitation for the exercise Stress management education;Relaxation education;Encouraged to attend Cardiac Rehabilitation for the exercise Stress management education;Relaxation education;Encouraged to attend Cardiac Rehabilitation for the exercise   Continue Psychosocial Services  Follow up required by staff Follow up required by staff Follow up required by staff Follow up required by staff Follow up required by staff     Initial Review   Source of Stress Concerns Chronic Illness;Family Chronic Illness;Family Chronic Illness;Family Chronic Illness;Family Chronic Illness;Family   Comments Will continue to monitor and offer support as needed. Will continue to monitor and offer support as needed. Will continue to monitor and offer support as needed. Will continue to monitor and offer support as needed. Will continue to monitor and offer support as needed.            Psychosocial Discharge (Final Psychosocial Re-Evaluation):  Psychosocial Re-Evaluation - 11/09/23 1602       Psychosocial Re-Evaluation   Current issues with Current Stress  Concerns;Current Anxiety/Panic    Comments Madison West continues to voice concerns about her  upcoming procedure on 11/17/23 due to hemoptysis.    Expected Outcomes Madison West will have decreased or controlled anxiety/ stressors upon completion of cardiac rehab    Interventions Stress management education;Relaxation education;Encouraged to attend Cardiac Rehabilitation for the exercise    Continue Psychosocial Services  Follow up required by staff      Initial Review   Source of Stress Concerns Chronic Illness;Family    Comments Will  continue to monitor and offer support as needed.             Education: Education Goals: Education classes will be provided on a weekly basis, covering required topics. Participant will state understanding/return demonstration of topics presented.  Learning Barriers/Preferences:  Learning Barriers/Preferences - 12/01/23 1319       Learning Barriers/Preferences   Learning Barriers None    Learning Preferences Group Instruction;Individual Instruction;Verbal Instruction;Written Material             Education Topics: Know Your Numbers Group instruction that is supported by a PowerPoint presentation. Instructor discusses importance of knowing and understanding resting, exercise, and post-exercise oxygen saturation, heart rate, and blood pressure. Oxygen saturation, heart rate, blood pressure, rating of perceived exertion, and dyspnea are reviewed along with a normal range for these values.    Exercise for the Pulmonary Patient Group instruction that is supported by a PowerPoint presentation. Instructor discusses benefits of exercise, core components of exercise, frequency, duration, and intensity of an exercise routine, importance of utilizing pulse oximetry during exercise, safety while exercising, and options of places to exercise outside of rehab.    MET Level  Group instruction provided by PowerPoint, verbal discussion, and written material to support subject matter. Instructor reviews what METs are and how to increase METs.    Pulmonary Medications Verbally interactive group education provided by instructor with focus on inhaled medications and proper administration.   Anatomy and Physiology of the Respiratory System Group instruction provided by PowerPoint, verbal discussion, and written material to support subject matter. Instructor reviews respiratory cycle and anatomical components of the respiratory system and their functions. Instructor also reviews differences in  obstructive and restrictive respiratory diseases with examples of each.    Oxygen Safety Group instruction provided by PowerPoint, verbal discussion, and written material to support subject matter. There is an overview of "What is Oxygen" and "Why do we need it".  Instructor also reviews how to create a safe environment for oxygen use, the importance of using oxygen as prescribed, and the risks of noncompliance. There is a brief discussion on traveling with oxygen and resources the patient may utilize.   Oxygen Use Group instruction provided by PowerPoint, verbal discussion, and written material to discuss how supplemental oxygen is prescribed and different types of oxygen supply systems. Resources for more information are provided.    Breathing Techniques Group instruction that is supported by demonstration and informational handouts. Instructor discusses the benefits of pursed lip and diaphragmatic breathing and detailed demonstration on how to perform both.     Risk Factor Reduction Group instruction that is supported by a PowerPoint presentation. Instructor discusses the definition of a risk factor, different risk factors for pulmonary disease, and how the heart and lungs work together.   Pulmonary Diseases Group instruction provided by PowerPoint, verbal discussion, and written material to support subject matter. Instructor gives an overview of the different type of pulmonary diseases. There is also a discussion on risk factors and symptoms  as well as ways to manage the diseases.   Stress and Energy Conservation Group instruction provided by PowerPoint, verbal discussion, and written material to support subject matter. Instructor gives an overview of stress and the impact it can have on the body. Instructor also reviews ways to reduce stress. There is also a discussion on energy conservation and ways to conserve energy throughout the day.   Warning Signs and Symptoms Group  instruction provided by PowerPoint, verbal discussion, and written material to support subject matter. Instructor reviews warning signs and symptoms of stroke, heart attack, cold and flu. Instructor also reviews ways to prevent the spread of infection.   Other Education Group or individual verbal, written, or video instructions that support the educational goals of the pulmonary rehab program.    Knowledge Questionnaire Score:  Knowledge Questionnaire Score - 12/01/23 1503       Knowledge Questionnaire Score   Pre Score 14/18             Core Components/Risk Factors/Patient Goals at Admission:  Personal Goals and Risk Factors at Admission - 12/01/23 1344       Core Components/Risk Factors/Patient Goals on Admission    Weight Management Yes;Obesity;Weight Loss    Intervention Weight Management: Develop a combined nutrition and exercise program designed to reach desired caloric intake, while maintaining appropriate intake of nutrient and fiber, sodium and fats, and appropriate energy expenditure required for the weight goal.;Weight Management: Provide education and appropriate resources to help participant work on and attain dietary goals.;Weight Management/Obesity: Establish reasonable short term and long term weight goals.;Obesity: Provide education and appropriate resources to help participant work on and attain dietary goals.    Admit Weight 194 lb 9.6 oz (88.3 kg)    Goal Weight: Short Term 184 lb (83.5 kg)    Goal Weight: Long Term 170 lb (77.1 kg)   pt goal   Expected Outcomes Short Term: Continue to assess and modify interventions until short term weight is achieved;Long Term: Adherence to nutrition and physical activity/exercise program aimed toward attainment of established weight goal;Weight Loss: Understanding of general recommendations for a balanced deficit meal plan, which promotes 1-2 lb weight loss per week and includes a negative energy balance of [phone]  kcal/d;Understanding recommendations for meals to include 15-35% energy as protein, 25-35% energy from fat, 35-60% energy from carbohydrates, less than 200mg  of dietary cholesterol, 20-35 gm of total fiber daily;Understanding of distribution of calorie intake throughout the day with the consumption of 4-5 meals/snacks    Tobacco Cessation Yes    Number of packs per day pt states smokes occassionally with dgt    Intervention Assist the participant in steps to quit. Provide individualized education and counseling about committing to Tobacco Cessation, relapse prevention, and pharmacological support that can be provided by physician.;Education officer, environmental, assist with locating and accessing local/national Quit Smoking programs, and support quit date choice.    Expected Outcomes Short Term: Will demonstrate readiness to quit, by selecting a quit date.;Long Term: Complete abstinence from all tobacco products for at least 12 months from quit date.;Short Term: Will quit all tobacco product use, adhering to prevention of relapse plan.    Improve shortness of breath with ADL's Yes    Intervention Provide education, individualized exercise plan and daily activity instruction to help decrease symptoms of SOB with activities of daily living.    Expected Outcomes Short Term: Improve cardiorespiratory fitness to achieve a reduction of symptoms when performing ADLs;Long Term: Be able to perform more ADLs  without symptoms or delay the onset of symptoms    Stress Yes    Intervention Offer individual and/or small group education and counseling on adjustment to heart disease, stress management and health-related lifestyle change. Teach and support self-help strategies.;Refer participants experiencing significant psychosocial distress to appropriate mental health specialists for further evaluation and treatment. When possible, include family members and significant others in education/counseling sessions.     Expected Outcomes Short Term: Participant demonstrates changes in health-related behavior, relaxation and other stress management skills, ability to obtain effective social support, and compliance with psychotropic medications if prescribed.;Long Term: Emotional wellbeing is indicated by absence of clinically significant psychosocial distress or social isolation.             Core Components/Risk Factors/Patient Goals Review:   Goals and Risk Factor Review     Row Name 08/11/23 1610 08/19/23 1537 09/21/23 0958 10/12/23 0908 11/09/23 1605     Core Components/Risk Factors/Patient Goals Review   Personal Goals Review Weight Management/Obesity;Heart Failure;Lipids;Hypertension;Stress;Improve shortness of breath with ADL's Weight Management/Obesity;Heart Failure;Lipids;Hypertension;Stress;Improve shortness of breath with ADL's Weight Management/Obesity;Heart Failure;Lipids;Hypertension;Stress;Improve shortness of breath with ADL's Weight Management/Obesity;Heart Failure;Lipids;Hypertension;Stress;Improve shortness of breath with ADL's Weight Management/Obesity;Heart Failure;Lipids;Hypertension;Stress;Improve shortness of breath with ADL's   Review Ayeshia started cardiac rehab on 07/28/23. Erinne is off to a good start to exercise. oxygen saturatiobns have been consistantly dropping into the mid 80's. Order obtained to use oxygen with exercise. Patient agreeable.  Will use oxygen on next scheduled visit. Lakin is enjoying participating in cardiac rehab so far. Jaicee is doing well with exercise at  cardiac rehab. Vital signs and oxygen saturations have been stable now that Madison West is wearing oxygen with exercise. oxygen saturations have been in the low to mid 90's on 2l/min. Conita has an upcoming appointment with her pulmonolgist in March. Lakedra is doing well with exercise at  cardiac rehab. Vital signs and oxygen saturations have been stable now that Madison West is wearing oxygen with exercise. Oxygen  saturations have remained in the low to mid 90's on 2l/min. Madison West would benefit from participating in pulmonary rehab when she completes cardiac rehab. Madison West says she is more interested in water aerobics. Madison West has lost 1.7 kg since starting cardiac rehab. Saniyya is doing well with exercise at  cardiac rehab. Vital signs have been stable.  Oxygen saturations have remained in the upper 80's to  mid 90's on 2l/min. Madison West would benefit from participating in pulmonary rehab when she completes cardiac rehab. Madison West says she is more interested in water aerobics. Madison West has lost 2.0 kg since starting cardiac rehab. Will notify Dr Filiberto Hug about oxygen saturations. Emilee is doing well with exercise at  cardiac rehab. Vital signs have been stable.  Oxygen saturations have remained in the upper 80's to  mid 90's on 2l/min. Madison West would benefit from participating in pulmonary rehab when she completes cardiac rehab. Madison West has lost 4 kg since starting cardiac rehab. Madison West will complete cardiac rehab on 11/17/23   Expected Outcomes Paisleigh will continue to participate in cardiac rehab for exercise, nutrition and lifestyle modifications Eilah will continue to participate in cardiac rehab for exercise, nutrition and lifestyle modifications Jenay will continue to participate in cardiac rehab for exercise, nutrition and lifestyle modifications Carigan will continue to participate in cardiac rehab for exercise, nutrition and lifestyle modifications Magdelene will continue to participate in cardiac rehab for exercise, nutrition and lifestyle modifications            Core Components/Risk Factors/Patient Goals at Discharge (Final  Review):   Goals and Risk Factor Review - 11/09/23 1605       Core Components/Risk Factors/Patient Goals Review   Personal Goals Review Weight Management/Obesity;Heart Failure;Lipids;Hypertension;Stress;Improve shortness of breath with ADL's    Review Kellye is doing well with exercise at  cardiac rehab. Vital  signs have been stable.  Oxygen saturations have remained in the upper 80's to  mid 90's on 2l/min. Madison West would benefit from participating in pulmonary rehab when she completes cardiac rehab. Madison West has lost 4 kg since starting cardiac rehab. Madison West will complete cardiac rehab on 11/17/23    Expected Outcomes Aubry will continue to participate in cardiac rehab for exercise, nutrition and lifestyle modifications             ITP Comments:  ITP Comments     Row Name 07/26/23 1417 08/19/23 1533 09/21/23 0954 10/12/23 0904 11/09/23 1601   ITP Comments Dr. Gaylyn Keas medical director. Introduction to pritikin education/intensive cardiac rehab. Initial orientation packet reviewed with patient. 30 Day ITP Review. Madison West has good attendance and participation with exercise at cardiac rehab. Madison West is off to a good start to exercise for her fitness level. 30 Day ITP Review. Madison West continues to have good attendance and participation with exercise at cardiac rehab. 30 Day ITP Review. Madison West continues to have good attendance and participation with exercise at cardiac rehab. Madison West will complete cardiac rehab in the begining of May. 30 Day ITP Review. Madison West continues to have good attendance and participation with exercise at cardiac rehab. Madison West will complete cardiac rehab on May 28 after her bronchoscopy is completed            Comments: Dr. Genetta Kenning is Medical Director for Pulmonary Rehab at [facility].

## 2023-12-01 NOTE — Progress Notes (Signed)
 Madison West 68 y.o. female  Pulmonary Rehab Orientation Note  This patient who was referred to Pulmonary Rehab by Dr. Praveen Mannam with the diagnosis of Dyspnea on Exertion arrived today in Cardiac and Pulmonary Rehab. She  arrived ambulatory with normal gait. She  does not carry portable oxygen, though she is ordered to do so. [employer] is the provider for their DME. Per patient, Madison West uses oxygen occasionally. Color good, skin warm and dry. Patient is oriented to time and place. Patient's medical history, psychosocial health, and medications reviewed.   Psychosocial assessment reveals patient lives with family. 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 Patient hobbies include watching tv and spending time with others. Patient reports her stress level is moderate. Areas of stress/anxiety include health and family . Patient does not exhibit signs of depression. Signs of depression include helplessness and fatigue. PHQ2/9 score 0/3. Madison West shows good  coping skills with positive outlook on life. Offered emotional support and reassurance. Will continue to monitor and evaluate progress toward psychosocial goal(s) of decreased health and family related stress.   Physical assessment reveals:  Well appearing, A&Ox4, NAD Eyes/Ears: WNL Lungs: Diminished with no wheezes, rales, rhonchi, denies chronic cough, dyspnea on exertion, no O2 brought with pt today, order is for 4L with exertion and sleep Heart: Regular rate rhythm, no murmurs, no rubs, no clicks Gastrointestinal: abdomin soft, + bowel sounds in all 4 quads, denies recent weight gain or loss, endorses normal BMs Genitourinary: WNL, pt denies s/s Extremities:  +2 pulses, grip strength equal, strong, no edema, no cyanosis, no clubbing Integumentary: pt denies any rashes, open or non healing wounds Psy/Soc: Pt denies, extensive mental health history, sees psychiatrist regularly. Pt declines any needs, resources, or  referrals Assistive devices: Pt has oxygen tanks and home concentrator   Madison West reports she does take medications as prescribed. Patient states she follows a regular  diet. The patient wants to lose weight. Pt's weight will be monitored closely.   Demonstration and practice of PLB using pulse oximeter. Madison West able to return demonstration satisfactorily. Safety and hand hygiene in the exercise area reviewed with patient. Madison West voices understanding of the information reviewed. Department expectations discussed with patient and achievable goals were set. The patient shows enthusiasm about attending the program and we look forward to working with Madison West. Madison West completed a 6 min walk test today and is scheduled to begin exercise on 6/19 at 1315.   1300-1430 Damir Leung

## 2023-12-03 ENCOUNTER — Telehealth: Payer: Self-pay | Admitting: Pharmacist

## 2023-12-03 NOTE — Telephone Encounter (Signed)
 Agree with all the above. She is very tenuous.  Thanks MJP

## 2023-12-03 NOTE — Telephone Encounter (Signed)
 Resting HR 68 bpm 81 bpm 85 bpm     Resting BP 112/60 120/60 100/58    Resting Oxygen Saturation  92 % 90 % 97 %    Exercise Oxygen Saturation  during 6 min walk 84 % 88 % 87 %    Max Ex. HR 100 bpm 108 bpm 112 bpm    Max Ex. BP 136/66 140/60 146/70    2 Minute Post BP 120/62 -- 130/70  I spoke with patient. Doing well after her bronc BP at home is around 106-117 systolic. About to start pulmonary rehab Her Na was 132 on last CMP. I will ask Dr. Filiberto Hug to repeat when she is in on 6/23. She also needs a repeat TSH and Sed rate. Will see if Dr. Filiberto Hug is willing to order these as well. At this time, I do not think she would tolerate Entresto . She might tolerate metoprolol  12.5mg  daily, but she is very scared of taking this.  No changes for now. Will need to repeat Na. May have to stop spiro if persistently low.   Pt also asking about status of her portal o2 tank. Will send to pulmonary.

## 2023-12-06 NOTE — Telephone Encounter (Signed)
 Rc'd signed fax order from [employer] back from Ms. Margit Shelling. Will fax to [phone] and wait for fax confirmation. Once done will send to scan.

## 2023-12-07 ENCOUNTER — Encounter (HOSPITAL_COMMUNITY)
Admission: RE | Admit: 2023-12-07 | Discharge: 2023-12-07 | Disposition: A | Discharge status: Home / Self Care | Source: Ambulatory Visit | Attending: Pulmonary Disease | Admitting: Pulmonary Disease

## 2023-12-07 DIAGNOSIS — R0609 Other forms of dyspnea: Secondary | ICD-10-CM

## 2023-12-07 NOTE — Progress Notes (Signed)
 Daily Session Note  Patient Details  Name: Madison West MRN: [id number] Date of Birth: 1956-04-12 Referring Provider:   Gattis Kass Pulmonary Rehab Walk Test from 12/01/2023 in [facility] for [facility], [facility]  Referring Provider Mannam    Encounter Date: 12/07/2023  Check In:  Session Check In - 12/07/23 1457       Check-In   Supervising physician immediately available to respond to emergencies [facility] MD immediately available    Physician(s) Morey Ar, NP    Location [facility]    Staff Present Willard Harman, RN, BSN;Randi Regis Captain BS, ACSM-CEP, Exercise Physiologist;Brand Siever Nolon Baxter, MS, ACSM-CEP, Exercise Physiologist;Casey Felipe Horton, RT    Virtual Visit No    Medication changes reported     No    Fall or balance concerns reported    No    Tobacco Cessation No Change    Warm-up and Cool-down Performed as group-led instruction    Resistance Training Performed Yes    VAD Patient? No    PAD/SET Patient? No      Pain Assessment   Currently in Pain? No/denies    Pain Score 0-No pain    Multiple Pain Sites No          Capillary Blood Glucose: No results found for this or any previous visit (from the past 24 hours).    Social History   Tobacco Use  Smoking Status Former   Current packs/day: 0.00   Average packs/day: 0.8 packs/day for 20.0 years (15.0 ttl pk-yrs)   Types: Cigarettes   Start date: 05/06/1977   Quit date: 05/06/1997   Years since quitting: 26.6  Smokeless Tobacco Never    Goals Met:  Proper associated with RPD/PD & O2 Sat Exercise tolerated well No report of concerns or symptoms today Strength training completed today  Goals Unmet:  Not Applicable  Comments: Service time is from 1316 to 1437.    Dr. Genetta Kenning is Medical Director for Pulmonary Rehab at [facility].

## 2023-12-09 ENCOUNTER — Encounter (HOSPITAL_COMMUNITY)

## 2023-12-09 ENCOUNTER — Telehealth (HOSPITAL_COMMUNITY): Payer: Self-pay | Admitting: *Deleted

## 2023-12-09 LAB — FUNGUS CULTURE WITH STAIN

## 2023-12-09 LAB — FUNGAL ORGANISM REFLEX

## 2023-12-09 LAB — FUNGUS CULTURE RESULT

## 2023-12-09 NOTE — Telephone Encounter (Signed)
 Returned call regarding her appt today. She is tired and won't be able to come to today. She also is worried that she will get out of class too late on every Thursday for work. We will continue to discuss.  German Koller BS, ACSM-CEP 12/09/2023 8:01 AM

## 2023-12-13 ENCOUNTER — Ambulatory Visit: Attending: Cardiology | Admitting: Cardiology

## 2023-12-13 ENCOUNTER — Encounter: Payer: Self-pay | Admitting: Cardiology

## 2023-12-13 VITALS — BP 134/84 | HR 77 | Ht 68.0 in | Wt 194.0 lb

## 2023-12-13 DIAGNOSIS — I5020 Unspecified systolic (congestive) heart failure: Secondary | ICD-10-CM | POA: Insufficient documentation

## 2023-12-13 DIAGNOSIS — Z952 Presence of prosthetic heart valve: Secondary | ICD-10-CM | POA: Diagnosis not present

## 2023-12-13 DIAGNOSIS — E782 Mixed hyperlipidemia: Secondary | ICD-10-CM | POA: Insufficient documentation

## 2023-12-13 DIAGNOSIS — I25118 Atherosclerotic heart disease of native coronary artery with other forms of angina pectoris: Secondary | ICD-10-CM | POA: Insufficient documentation

## 2023-12-13 MED ORDER — CLOPIDOGREL BISULFATE 75 MG PO TABS: 75.0000 mg | ORAL_TABLET | Freq: Every day | ORAL | 3 refills | Status: AC

## 2023-12-13 MED ORDER — ROSUVASTATIN CALCIUM 10 MG PO TABS: 10.0000 mg | ORAL_TABLET | Freq: Every day | ORAL | 3 refills | Status: AC

## 2023-12-13 MED ORDER — ASPIRIN 81 MG PO TBEC: 81.0000 mg | DELAYED_RELEASE_TABLET | Freq: Every day | ORAL | 3 refills | Status: DC

## 2023-12-13 MED ORDER — DAPAGLIFLOZIN PROPANEDIOL 10 MG PO TABS: 10.0000 mg | ORAL_TABLET | Freq: Every day | ORAL | 3 refills | Status: AC

## 2023-12-13 MED ORDER — FUROSEMIDE 20 MG PO TABS: 10.0000 mg | ORAL_TABLET | Freq: Every day | ORAL | 3 refills | Status: AC | PRN

## 2023-12-13 MED ORDER — SPIRONOLACTONE 25 MG PO TABS: 25.0000 mg | ORAL_TABLET | Freq: Every day | ORAL | 3 refills | Status: AC

## 2023-12-13 MED ORDER — LOSARTAN POTASSIUM 25 MG PO TABS: 25.0000 mg | ORAL_TABLET | Freq: Every day | ORAL | 3 refills | Status: AC

## 2023-12-13 NOTE — Patient Instructions (Addendum)
 Medication Instructions:  Your physician has recommended you make the following change in your medication:   1-INCREASE Spirolactone 25 mg by mouth daily.  *If you need a refill on your cardiac medications before your next appointment, please call your pharmacy*  Lab Work: Your physician recommends that you return for lab work in: 1 week for BMET, TSH and BNP If you have labs (blood work) drawn today and your tests are completely normal, you will receive your results only by: MyChart Message (if you have MyChart) OR A paper copy in the mail If you have any lab test that is abnormal or we need to change your treatment, we will call you to review the results.  Testing/Procedures: Your physician has requested that you have an echocardiogram in August. Echocardiography is a painless test that uses sound waves to create images of your heart. It provides your doctor with information about the size and shape of your heart and how well your heart's chambers and valves are working. This procedure takes approximately one hour. There are no restrictions for this procedure. Please do NOT wear cologne, perfume, aftershave, or lotions (deodorant is allowed). Please arrive 15 minutes prior to your appointment time.  Please note: We ask at that you not bring children with you during ultrasound (echo/ vascular) testing. Due to room size and safety concerns, children are not allowed in the ultrasound rooms during exams. Our front office staff cannot provide observation of children in our lobby area while testing is being conducted. An adult accompanying a patient to their appointment will only be allowed in the ultrasound room at the discretion of the ultrasound technician under special circumstances. We apologize for any inconvenience. Your physician has requested that you have en exercise stress myoview . For further information please visit [email or web address] Please follow instruction sheet, as  given.  Follow-Up: At [facility], you and your health needs are our priority.  As part of our continuing mission to provide you with exceptional heart care, our providers are all part of one team.  This team includes your primary Cardiologist (physician) and Advanced Practice Providers or APPs (Physician Assistants and Nurse Practitioners) who all work together to provide you with the care you need, when you need it.  Your next appointment:   3 month(s)  Provider:   [name], MD    We recommend signing up for the patient portal called MyChart.  Sign up information is provided on this After Visit Summary.  MyChart is used to connect with patients for Virtual Visits (Telemedicine).  Patients are able to view lab/test results, encounter notes, upcoming appointments, etc.  Non-urgent messages can be sent to your provider as well.   To learn more about what you can do with MyChart, go to [email or web address].   Other Instructions Diet & Lifestyle recommendations:  Physical activity recommendation (The Physical Activity Guidelines for Americans. JAMA 2018;Nov 12) At least 150-300 minutes a week of moderate-intensity, or 75-150 minutes a week of vigorous-intensity aerobic physical activity, or an equivalent combination of moderate- and vigorous-intensity aerobic activity. Adults should perform muscle-strengthening activities on 2 or more days a week. Older adults should do multicomponent physical activity that includes balance training as well as aerobic and muscle-strengthening activities. Benefits of increased physical activity include lower risk of mortality including cardiovascular mortality, lower risk of cardiovascular events and associated risk factors (hypertension and diabetes), and lower risk of many cancers (including bladder, breast, colon, endometrium, esophagus, kidney, lung, and stomach). Additional improvments have been  seen in cognition, risk of dementia,  anxiety and depression, improved bone health, lower risk of falls, and associated injuries.  Dietary recommendation The 2019 ACC/AHA guidelines promote nutrition as a main fixture of cardiovascular wellness, with a recommendation for a varied diet of fruit, vegetables, fish, legumes, and whole grains (Class I), as well as recommendations to reduce sodium, cholesterol, processed meats, and refined sugars (Class IIa recommendation).10 Sodium intake, a topic of some controversy as of late, is recommended to be kept at 1,500 mg/day or less, far below the average daily intake in the [place]  of 3,409 mg/day, and notably below that of previous US  recommendations for 300mg /day.10,11 For those unable to reach 1,500 mg/day, they recommend at least a reduction of 1000 mg/day.  A Pesco-Mediterranean Diet With Intermittent Fasting: JACC Review Topic of the Week. J Am Coll Cardiol 2020;76:1484-1493 Pesco-Mediterranean diet, it is supplemented with extra-virgin olive oil (EVOO), which is the principle fat source, along with moderate amounts of dairy (particularly yogurt and cheese) and eggs, as well as modest amounts of alcohol consumption (ideally red wine with the evening meal), but few red and processed meats.

## 2023-12-13 NOTE — Progress Notes (Signed)
 Cardiology Office Note:  .   Date:  12/13/2023  ID:  Madison West, DOB [birth year], MRN [id number] PCP: Teresa Aldona CROME, NP  [facility] Providers Cardiologist:  Newman Lawrence, MD PCP: Teresa Aldona CROME, NP  Chief Complaint  Patient presents with   Coronary Artery Disease      History of Present Illness: .    Madison West is a 68 y.o. female with hyperlipidemia, CAD, HFrEF, h/o DVT, carotid artery disease s/p Rt CEA, h/o breast cancer  Patient had prolonged hospital stay in 01/2023 with VF arrest due to acute stent thrombosis, underwent TAVR for severe AS , developed provoked DVT in Lt internal jugular along with acute superficial vein thrombosis involving the left cephalic vein, proximal upper arm to mid forearm (01/2023).  Patient has completed anticoagulation since then, and is currently on aspirin  and Plavix .  Patient is doing well, completed cardiac rehab and is now participating in pulmonary rehab. Patient recently underwent bronchoscopy due to complaints of hemoptysis.  There was no malignancy detected.  He was recommended a follow-up CT scan in 3 months.  She was recommended nighttime oxygen use.  She has recently had more improved results of mild chest pain on exertion, although is not limiting.  She is concerned given her prior history of CAD.   Vitals:   12/13/23 1319  BP: 134/84  Pulse: 77  SpO2: 95%       ROS:  Review of Systems  Cardiovascular:  Positive for chest pain. Negative for dyspnea on exertion, leg swelling, palpitations and syncope.     Studies Reviewed: SABRA        Labs 10/2023: Chol 143, TG 99, HDL 54, LDL 71 Hb 12.2 Cr 9.12 TSH 6.3  08/2023: ProBNP 866 12/2022: Chol 220, TG 126, HDL 51, LDL 146  Echocardiogram 06/2023: 1. Left ventricular ejection fraction, by estimation, is 35 to 40%. The  left ventricle has moderately decreased function. The left ventricle  demonstrates regional wall motion abnormalities (see  scoring  diagram/findings for description). Elevated left  ventricular end-diastolic pressure.   2. Right ventricular systolic function is normal. The right ventricular  size is normal. Tricuspid regurgitation signal is inadequate for assessing  PA pressure.   3. The mitral valve is normal in structure. Trivial mitral valve  regurgitation. No evidence of mitral stenosis. Moderate mitral annular  calcification.   4. Well-seated bioprosthetic valve with normal function. The aortic valve  is normal in structure. Aortic valve regurgitation is not visualized. No  aortic stenosis is present. There is a 26 mm Sapien prosthetic (TAVR)  valve present in the aortic  position.   5. The inferior vena cava is normal in size with greater than 50%  respiratory variability, suggesting right atrial pressure of 3 mmHg.    Physical Exam:   Physical Exam Vitals and nursing note reviewed.  Constitutional:      General: She is not in acute distress. Neck:     Vascular: No JVD.   Cardiovascular:     Rate and Rhythm: Normal rate and regular rhythm.     Heart sounds: Normal heart sounds. No murmur heard. Pulmonary:     Effort: Pulmonary effort is normal.     Breath sounds: Normal breath sounds. No wheezing or rales.   Musculoskeletal:     Right lower leg: No edema.     Left lower leg: No edema.      VISIT DIAGNOSES:   ICD-10-CM   1. Coronary artery disease  of native artery of native heart with stable angina pectoris (HCC)  I25.118 Pro b natriuretic peptide (BNP)    Basic metabolic panel with GFR    MYOCARDIAL PERFUSION IMAGING    ECHOCARDIOGRAM COMPLETE    2. HFrEF (heart failure with reduced ejection fraction) (HCC)  I50.20 Pro b natriuretic peptide (BNP)    Basic metabolic panel with GFR    ECHOCARDIOGRAM COMPLETE    furosemide  (LASIX ) 20 MG tablet    3. S/P TAVR (transcatheter aortic valve replacement)  Z95.2 ECHOCARDIOGRAM COMPLETE    furosemide  (LASIX ) 20 MG tablet    4. Mixed  hyperlipidemia  E78.2 Pro b natriuretic peptide (BNP)    Basic metabolic panel with GFR    MYOCARDIAL PERFUSION IMAGING    ECHOCARDIOGRAM COMPLETE    furosemide  (LASIX ) 20 MG tablet    rosuvastatin  (CRESTOR ) 10 MG tablet        ASSESSMENT AND PLAN: .    Madison West is a 68 y.o. female with  hyperlipidemia, CAD, HFrEF, h/o DVT, carotid artery disease s/p Rt CEA, h/o breast cancer   CAD: PCI to LAD, complicated by stent thrombosis while on Plavix  in the setting of severe AS and HFrEF, leading to VF arrest, treated with thrombectomy and balloon dilatation with excellent results. No anginal symptoms at this time. Currently on Aspirin  and Plavix , Recommend at least till 01/2023, after that could use Plavix  monotherapy. Given recent exertional chest pain symptoms, recommend exercise nuclear stress test.   HFrEF: EF 35-40% (06/2023). Uptitration of GDMT has been significantly limited due to low blood pressures, complaints of dizziness and intolerance in general. Patient remains reluctant to be on beta-blocker. Also reluctant to switch losartan  to Entresto . She is amenable to increasing spironolactone  from 12.5 mg daily to 25 mg daily. Continue Farxiga  10 mg daily. Check BMP, proBNP in 1 week. Check echocardiogram in 01/2024.  Patient has had some recent urinary frequency, without any dysuria, fevers, chills.  I have encouraged her to discuss with her PCP regarding possibly checking a UA/urine culture.  This will be important given that she is on Farxiga , which can increase UTI risk.    Hemoptysis: Continue follow-up with pulmonology.  H/o VF arrest: Occurred in the setting of acute stent thrombosis in 01/2023. Previously on amiodarone , now off it. Will check TSH given previously elevated level.  F/u in 3 months  Signed, [name], MD

## 2023-12-14 ENCOUNTER — Other Ambulatory Visit: Payer: Self-pay | Admitting: Cardiology

## 2023-12-14 ENCOUNTER — Encounter (HOSPITAL_COMMUNITY)
Admission: RE | Admit: 2023-12-14 | Discharge: 2023-12-14 | Disposition: A | Discharge status: Home / Self Care | Source: Ambulatory Visit | Attending: Cardiology | Admitting: Cardiology

## 2023-12-14 VITALS — Wt 194.9 lb

## 2023-12-14 DIAGNOSIS — R0609 Other forms of dyspnea: Secondary | ICD-10-CM | POA: Diagnosis not present

## 2023-12-14 DIAGNOSIS — E782 Mixed hyperlipidemia: Secondary | ICD-10-CM

## 2023-12-14 DIAGNOSIS — I25118 Atherosclerotic heart disease of native coronary artery with other forms of angina pectoris: Secondary | ICD-10-CM

## 2023-12-14 NOTE — Progress Notes (Signed)
 Daily Session Note  Patient Details  Name: Madison West MRN: [id number] Date of Birth: [birth year] Referring Provider:   Conrad Ports Pulmonary Rehab Walk Test from 12/01/2023 in [facility] for Heart, Vascular, & Lung Health  Referring Provider Mannam    Encounter Date: 12/14/2023  Check In:  Session Check In - 12/14/23 1324       Check-In   Supervising physician immediately available to respond to emergencies [facility] MD immediately available    Physician(s) Josefa Beauvais, NP    Location [facility]    Staff Present Ronal Levin, RN, BSN;Randi Midge BS, ACSM-CEP, Exercise Physiologist;Yehoshua Vitelli Nicholaus, MS, ACSM-CEP, Exercise Physiologist;Casey Claudene, RT    Virtual Visit No    Medication changes reported     No    Fall or balance concerns reported    No    Tobacco Cessation No Change    Warm-up and Cool-down Performed as group-led instruction    Resistance Training Performed Yes    VAD Patient? No    PAD/SET Patient? No      Pain Assessment   Currently in Pain? No/denies    Multiple Pain Sites No          Capillary Blood Glucose: No results found for this or any previous visit (from the past 24 hours).   Exercise Prescription Changes - 12/14/23 1300       Response to Exercise   Blood Pressure (Admit) 112/64    Blood Pressure (Exercise) 108/64    Blood Pressure (Exit) 94/62    Heart Rate (Admit) 84 bpm    Heart Rate (Exercise) 105 bpm    Heart Rate (Exit) 96 bpm    Oxygen Saturation (Admit) 92 %   RA   Oxygen Saturation (Exercise) 92 %   2L   Oxygen Saturation (Exit) 92 %    Rating of Perceived Exertion (Exercise) 12    Perceived Dyspnea (Exercise) 2    Duration Continue with 30 min of aerobic exercise without signs/symptoms of physical distress.    Intensity THRR unchanged      Progression   Progression Continue to progress workloads to maintain intensity without signs/symptoms of physical distress.      Resistance  Training   Training Prescription Yes    Weight blue bands    Reps 10-15    Time 10 Minutes      Interval Training   Interval Training No      Oxygen   Oxygen Continuous    Liters 0-2      Bike   Level 3    Minutes 15    METs 3.8      Track   Laps 11    Minutes 15    METs 2.37      Oxygen   Maintain Oxygen Saturation 88% or higher          Social History   Tobacco Use  Smoking Status Former   Current packs/day: 0.00   Average packs/day: 0.8 packs/day for 20.0 years (15.0 ttl pk-yrs)   Types: Cigarettes   Start date: 05/06/1977   Quit date: 05/06/1997   Years since quitting: 26.6  Smokeless Tobacco Never    Goals Met:  Proper associated with RPD/PD & O2 Sat Exercise tolerated well No report of concerns or symptoms today Strength training completed today  Goals Unmet:  Not Applicable  Comments: Service time is from 1314 to 1450.    Dr. Slater Staff is Medical Director for  Pulmonary Rehab at [facility].

## 2023-12-16 ENCOUNTER — Telehealth (HOSPITAL_COMMUNITY): Payer: Self-pay

## 2023-12-16 ENCOUNTER — Encounter (HOSPITAL_COMMUNITY)

## 2023-12-16 NOTE — Telephone Encounter (Signed)
 She states she has not received her POC yest, Advised that I would call [employer] to find out what was going on with the POC and asked them to call her to update her.  Her left leg has been swollen and very painful, she said it may be a little bit warm.  She said she has had a DVT, she is on Plavix  and Asprin.  She is working and she is on her feet 5 days per week.  She is cancelling her pulmonary d/u the pain in her neck, back and leg.  She said the pain in her leg is better.  Advised to call her PCP if she develops that pain again or the swelling/redness/warmth to have it evaluated.  She verbalized understanding.  Nothing further needed.    [facility] Brad ([facility]), left detailed message regarding patient and the need for an update on the POC for her.  I asked that someone from [employer] call the patient to update her and let her now when she will received it.  Nothing further needed.

## 2023-12-16 NOTE — Telephone Encounter (Signed)
 Patient c/o for 1:15pm, states she is exhausted from work and needs to rest.

## 2023-12-21 ENCOUNTER — Encounter (HOSPITAL_COMMUNITY)
Admission: RE | Admit: 2023-12-21 | Discharge: 2023-12-21 | Disposition: A | Discharge status: Home / Self Care | Source: Ambulatory Visit | Attending: Cardiology | Admitting: Cardiology

## 2023-12-21 DIAGNOSIS — R0609 Other forms of dyspnea: Secondary | ICD-10-CM | POA: Insufficient documentation

## 2023-12-21 NOTE — Progress Notes (Signed)
 Daily Session Note  Patient Details  Name: Madison West MRN: [id number] Date of Birth: [birth year] Referring Provider:   Conrad Ports Pulmonary Rehab Walk Test from 12/01/2023 in [facility]  Referring Provider Mannam    Encounter Date: 12/21/2023  Check In:  Session Check In - 12/21/23 1411       Check-In   Supervising physician immediately available to respond to emergencies [facility] MD immediately available    Physician(s) Jackee Alberts, NP    Location [facility]    Staff Present Ronal Levin, RN, BSN;Randi Midge BS, ACSM-CEP, Exercise Physiologist;Kaylee Nicholaus, MS, ACSM-CEP, Exercise Physiologist;Seyon Strader Claudene, RT    Virtual Visit No    Medication changes reported     No    Fall or balance concerns reported    No    Tobacco Cessation No Change    Warm-up and Cool-down Performed as group-led instruction    Resistance Training Performed Yes    VAD Patient? No    PAD/SET Patient? No      Pain Assessment   Currently in Pain? No/denies    Multiple Pain Sites No          Capillary Blood Glucose: No results found for this or any previous visit (from the past 24 hours).    Social History   Tobacco Use  Smoking Status Former   Current packs/day: 0.00   Average packs/day: 0.8 packs/day for 20.0 years (15.0 ttl pk-yrs)   Types: Cigarettes   Start date: 05/06/1977   Quit date: 05/06/1997   Years since quitting: 26.6  Smokeless Tobacco Never    Goals Met:  Proper associated with RPD/PD & O2 Sat Independence with exercise equipment Exercise tolerated well No report of concerns or symptoms today Strength training completed today  Goals Unmet:  Not Applicable  Comments: Service time is from 1328 to 1440.    Dr. Slater Staff is Medical Director for Pulmonary Rehab at [facility].

## 2023-12-23 ENCOUNTER — Encounter (HOSPITAL_COMMUNITY)

## 2023-12-23 ENCOUNTER — Encounter (HOSPITAL_COMMUNITY): Payer: Self-pay | Admitting: *Deleted

## 2023-12-23 NOTE — Addendum Note (Signed)
 Encounter addended by: Janann Lenis on: 12/23/2023 3:56 PM  Actions taken: Flowsheet accepted

## 2023-12-24 LAB — ACID FAST CULTURE WITH REFLEXED SENSITIVITIES (MYCOBACTERIA): Acid Fast Culture: NEGATIVE

## 2023-12-27 ENCOUNTER — Telehealth: Payer: Self-pay | Admitting: Physical Medicine and Rehabilitation

## 2023-12-27 NOTE — Telephone Encounter (Signed)
 P has questions about qutenza

## 2023-12-28 ENCOUNTER — Encounter (HOSPITAL_COMMUNITY)
Admission: RE | Admit: 2023-12-28 | Discharge: 2023-12-28 | Disposition: A | Discharge status: Home / Self Care | Source: Ambulatory Visit | Attending: Pulmonary Disease | Admitting: Pulmonary Disease

## 2023-12-28 VITALS — Wt 196.7 lb

## 2023-12-28 DIAGNOSIS — R0609 Other forms of dyspnea: Secondary | ICD-10-CM | POA: Diagnosis not present

## 2023-12-28 NOTE — Progress Notes (Signed)
 Daily Session Note  Patient Details  Name: Madison West MRN: [id number] Date of Birth: 1956/05/17 Referring Provider:   Conrad Ports Pulmonary Rehab Walk Test from 12/01/2023 in [facility]  Referring Provider Mannam    Encounter Date: 12/28/2023  Check In:  Session Check In - 12/28/23 1325       Check-In   Supervising physician immediately available to respond to emergencies [facility] MD immediately available    Physician(s) Damien Braver, NP    Location [facility]    Staff Present Ronal Levin, RN, BSN;Randi Midge BS, ACSM-CEP, Exercise Physiologist;Kaylee Nicholaus, MS, ACSM-CEP, Exercise Physiologist;Casey Claudene Meline Belarus, RD, LDN    Virtual Visit No    Medication changes reported     No    Fall or balance concerns reported    No    Tobacco Cessation No Change    Warm-up and Cool-down Performed as group-led instruction    Resistance Training Performed Yes    VAD Patient? No    PAD/SET Patient? No      Pain Assessment   Currently in Pain? No/denies    Multiple Pain Sites No          Capillary Blood Glucose: No results found for this or any previous visit (from the past 24 hours).   Exercise Prescription Changes - 12/28/23 1400       Response to Exercise   Blood Pressure (Admit) 106/58    Blood Pressure (Exercise) 154/62    Blood Pressure (Exit) 112/70    Heart Rate (Admit) 91 bpm    Heart Rate (Exercise) 114 bpm    Heart Rate (Exit) 94 bpm    Oxygen Saturation (Admit) 93 %   RA   Oxygen Saturation (Exercise) 92 %   2L   Oxygen Saturation (Exit) 92 %   RA   Rating of Perceived Exertion (Exercise) 13    Perceived Dyspnea (Exercise) 2    Duration Continue with 30 min of aerobic exercise without signs/symptoms of physical distress.    Intensity THRR unchanged      Progression   Progression Continue to progress workloads to maintain intensity without signs/symptoms of physical  distress.      Resistance Training   Training Prescription Yes    Weight blue bands    Reps 10-15    Time 10 Minutes      Oxygen   Oxygen Continuous    Liters 2      Bike   Level 3    Minutes 15    METs 4.1      Track   Laps 11    Minutes 15    METs 2.37      Oxygen   Maintain Oxygen Saturation 88% or higher          Social History   Tobacco Use  Smoking Status Former   Current packs/day: 0.00   Average packs/day: 0.8 packs/day for 20.0 years (15.0 ttl pk-yrs)   Types: Cigarettes   Start date: 05/06/1977   Quit date: 05/06/1997   Years since quitting: 26.6  Smokeless Tobacco Never    Goals Met:  Independence with exercise equipment Exercise tolerated well No report of concerns or symptoms today Strength training completed today  Goals Unmet:  Not Applicable  Comments: Service time is from 1315 to 1430    Dr. Slater Staff is Medical Director for Pulmonary Rehab at [facility].

## 2023-12-29 ENCOUNTER — Ambulatory Visit: Admitting: Registered Nurse

## 2023-12-29 NOTE — Progress Notes (Signed)
 Pulmonary Individual Treatment Plan  Patient Details  Name: Madison West MRN: [id number] Date of Birth: 23-Aug-1955 Referring Provider:   Conrad Ports Pulmonary Rehab Walk Test from 12/01/2023 in [facility]  Referring Provider Mannam    Initial Encounter Date:  Flowsheet Row Pulmonary Rehab Walk Test from 12/01/2023 in [facility]  Date 12/01/23    Visit Diagnosis: DOE (dyspnea on exertion)  Patient's Home Medications on Admission:   Current Outpatient Medications:    acetaminophen  (TYLENOL ) 325 MG tablet, Take 2 tablets (650 mg total) by mouth every 6 (six) hours., Disp: , Rfl:    alprazolam  (XANAX ) 2 MG tablet, Take 0.25-2 mg by mouth See admin instructions. 1 mg (may take up to 2 mg) twice daily. May also take 0.25 mg during the day if needed for anxiety., Disp: , Rfl:    aspirin  EC 81 MG tablet, Take 1 tablet (81 mg total) by mouth daily. Swallow whole., Disp: 90 tablet, Rfl: 3   busPIRone  (BUSPAR ) 10 MG tablet, TAKE 1 TABLET BY MOUTH TWICE A DAY, Disp: 180 tablet, Rfl: 1   clopidogrel  (PLAVIX ) 75 MG tablet, Take 1 tablet (75 mg total) by mouth daily., Disp: 90 tablet, Rfl: 3   dapagliflozin  propanediol (FARXIGA ) 10 MG TABS tablet, Take 1 tablet (10 mg total) by mouth daily before breakfast., Disp: 90 tablet, Rfl: 3   furosemide  (LASIX ) 20 MG tablet, Take 0.5 tablets (10 mg total) by mouth daily as needed (for lower extremity swelling and weight gain)., Disp: 30 tablet, Rfl: 3   losartan  (COZAAR ) 25 MG tablet, Take 1 tablet (25 mg total) by mouth daily., Disp: 90 tablet, Rfl: 3   methocarbamol  (ROBAXIN ) 500 MG tablet, Take 1 tablet (500 mg total) by mouth 2 (two) times daily., Disp: 60 tablet, Rfl: 0   nitroGLYCERIN  (NITROSTAT ) 0.4 MG SL tablet, Place 1 tablet (0.4 mg total) under the tongue every 5 (five) minutes as needed for chest pain., Disp: 25 tablet, Rfl:  2   oxyCODONE  (ROXICODONE ) 5 MG immediate release tablet, Take 1 tablet (5 mg total) by mouth daily as needed for severe pain (pain score 7-10)., Disp: 30 tablet, Rfl: 0   rosuvastatin  (CRESTOR ) 10 MG tablet, Take 1 tablet (10 mg total) by mouth daily., Disp: 90 tablet, Rfl: 3   spironolactone  (ALDACTONE ) 25 MG tablet, Take 1 tablet (25 mg total) by mouth daily., Disp: 90 tablet, Rfl: 3  Past Medical History: Past Medical History:  Diagnosis Date   Anxiety    Aortic stenosis    s/p TAVR 02/04/23   Arthritis    low back and hip pain intermittent   Breast cancer (HCC) 06/02/2007   r breast -surgery ,radiaology. chemotherapy   Carotid artery disease (HCC)    Carpal tunnel syndrome    right hand   CHF (congestive heart failure) (HCC)    Colon polyps    Complication of anesthesia    Fentanyl , Versed -makes extra hyper, bradycardia x 1 in PACU, [facility] (08/15/11 cardiology felt neostigmine may have resulted in AV nodal block)    Coronary artery disease    Depression    denies   DVT (deep venous thrombosis) (HCC)    provoked left IJ 02/10/23   Dysplasia of vulva    Hypertension    Palpitations    PSVT, s/p adenosine  08/04/16   S/P breast lumpectomy 07/04/2007   R breast   S/P radiation  therapy 2009   Vulva cancer (HCC)     Tobacco Use: Social History   Tobacco Use  Smoking Status Former   Current packs/day: 0.00   Average packs/day: 0.8 packs/day for 20.0 years (15.0 ttl pk-yrs)   Types: Cigarettes   Start date: 05/06/1977   Quit date: 05/06/1997   Years since quitting: 26.6  Smokeless Tobacco Never    Labs: Review Flowsheet  More data exists      Latest Ref Rng & Units 02/12/2023 02/13/2023 02/14/2023 06/21/2023 09/02/2023  Labs for ITP Cardiac and Pulmonary Rehab  Cholestrol 100 - 199 mg/dL - - - 815  856   LDL (calc) 0 - 99 mg/dL - - - 94  71   HDL-C >60 mg/dL - - - 67  54   Trlycerides 0 - 149 mg/dL - - - 867  99   O2 Saturation % 76.1  68.3  60.4  - -    Capillary  Blood Glucose: Lab Results  Component Value Date   GLUCAP 87 02/19/2023   GLUCAP 103 (H) 02/19/2023   GLUCAP 121 (H) 02/18/2023   GLUCAP 111 (H) 02/18/2023   GLUCAP 110 (H) 02/17/2023     Pulmonary Assessment Scores:  Pulmonary Assessment Scores     Row Name 12/01/23 1314         ADL UCSD   ADL Phase Entry     SOB Score total 28       CAT Score   CAT Score 10       mMRC Score   mMRC Score 2       UCSD: Self-administered rating of dyspnea associated with activities of daily living (ADLs) 6-point scale (0 = not at all to 5 = maximal or unable to do because of breathlessness)  Scoring Scores range from 0 to 120.  Minimally important difference is 5 units  CAT: CAT can identify the health impairment of COPD patients and is better correlated with disease progression.  CAT has a scoring range of zero to 40. The CAT score is classified into four groups of low (less than 10), medium (10 - 20), high (21-30) and very high (31-40) based on the impact level of disease on health status. A CAT score over 10 suggests significant symptoms.  A worsening CAT score could be explained by an exacerbation, poor medication adherence, poor inhaler technique, or progression of COPD or comorbid conditions.  CAT MCID is 2 points  mMRC: mMRC (Modified Medical Research Council) Dyspnea Scale is used to assess the degree of baseline functional disability in patients of respiratory disease due to dyspnea. No minimal important difference is established. A decrease in score of 1 point or greater is considered a positive change.   Pulmonary Function Assessment:  Pulmonary Function Assessment - 12/01/23 1522       Breath   Bilateral Breath Sounds Decreased    Shortness of Breath Yes;Limiting activity          Exercise Target Goals: Exercise Program Goal: Individual exercise prescription set using results from initial 6 min walk test and THRR while considering  patient's activity barriers  and safety.   Exercise Prescription Goal: Initial exercise prescription builds to 30-45 minutes a day of aerobic activity, 2-3 days per week.  Home exercise guidelines will be given to patient during program as part of exercise prescription that the participant will acknowledge.  Activity Barriers & Risk Stratification:  Activity Barriers & Cardiac Risk Stratification - 12/01/23 1312  Activity Barriers & Cardiac Risk Stratification   Activity Barriers Deconditioning;Back Problems;Neck/Spine Problems;Balance Concerns;Decreased Ventricular Function;Joint Problems;Muscular Weakness    Cardiac Risk Stratification --          6 Minute Walk:  6 Minute Walk     Row Name 07/26/23 1619 11/01/23 1300 12/01/23 1439     6 Minute Walk   Phase Initial Discharge Initial   Distance 1180 feet 1298 feet 1170 feet   Distance % Change -- 10 % --   Distance Feet Change -- 118 ft --   Walk Time 6 minutes 6 minutes 6 minutes   # of Rest Breaks 1  2:20-3:20 3/10 hip pain 0 0   MPH 2.23 2.5 2.22   METS 2.6 2.91 2.81   RPE 10 13 12    Perceived Dyspnea  1 1 1    VO2 Peak 9.11 10.2 9.83   Symptoms Yes (comment) Yes (comment) Yes (comment)  c/o 3/10 hip pain with distance   Comments Pt SpO2 down to 84%, recovered to 94% in around a minute and maintained. RPD = 1, resolved with rest. 6/10 Hip pain at end of SaO2 dropped to 88% on 2L @ 4 minutes-, increased to 3L. Pt had mils SOB, RPD = 1.  2/10 bilateral leg pain --   Resting HR 68 bpm 81 bpm 85 bpm   Resting BP 112/60 120/60 100/58   Resting Oxygen Saturation  92 % 90 % 97 %   Exercise Oxygen Saturation  during 6 min walk 84 % 88 % 87 %   Max Ex. HR 100 bpm 108 bpm 112 bpm   Max Ex. BP 136/66 140/60 146/70   2 Minute Post BP 120/62 -- 130/70     Interval HR   1 Minute HR -- -- 99   2 Minute HR -- -- 104   3 Minute HR -- -- 109   4 Minute HR -- -- 112   5 Minute HR -- -- 112   6 Minute HR -- -- 112   2 Minute Post HR -- -- 93    Interval Heart Rate? -- -- Yes     Interval Oxygen   Interval Oxygen? -- Yes Yes   Baseline Oxygen Saturation % -- -- 97 %   1 Minute Oxygen Saturation % -- 93 % 89 %   1 Minute Liters of Oxygen -- 2 L 0 L   2 Minute Oxygen Saturation % -- 89 % 91 %  87 RA 1:40, up to 1L   2 Minute Liters of Oxygen -- 2 L 1 L   3 Minute Oxygen Saturation % -- 89 % 93 %   3 Minute Liters of Oxygen -- 2 L 1 L   4 Minute Oxygen Saturation % -- 88 % 92 %   4 Minute Liters of Oxygen -- 2 L  increased to 3L 1 L   5 Minute Oxygen Saturation % -- 91 % 93 %   5 Minute Liters of Oxygen -- 3 L 1 L   6 Minute Oxygen Saturation % -- 91 % 92 %   6 Minute Liters of Oxygen -- 3 L 1 L   2 Minute Post Oxygen Saturation % -- 93 % 97 %   2 Minute Post Liters of Oxygen -- 2 L 1 L      Oxygen Initial Assessment:  Oxygen Initial Assessment - 12/01/23 1313       Home Oxygen   Home Oxygen Device Home Concentrator;E-Tanks  Sleep Oxygen Prescription Continuous    Liters per minute 4    Home Exercise Oxygen Prescription Continuous    Liters per minute 4    Home Resting Oxygen Prescription Continuous    Liters per minute 4    Compliance with Home Oxygen Use No      Initial 6 min Walk   Oxygen Used Continuous    Liters per minute 1      Program Oxygen Prescription   Program Oxygen Prescription Continuous    Liters per minute 1      Intervention   Short Term Goals To learn and exhibit compliance with exercise, home and travel O2 prescription;To learn and understand importance of maintaining oxygen saturations>88%;To learn and understand importance of monitoring SPO2 with pulse oximeter and demonstrate accurate use of the pulse oximeter.;To learn and demonstrate proper pursed lip breathing techniques or other breathing techniques.     Long  Term Goals Exhibits compliance with exercise, home  and travel O2 prescription;Maintenance of O2 saturations>88%;Verbalizes importance of monitoring SPO2 with pulse oximeter and  return demonstration;Exhibits proper breathing techniques, such as pursed lip breathing or other method taught during program session          Oxygen Re-Evaluation:  Oxygen Re-Evaluation     Row Name 12/21/23 0917             Program Oxygen Prescription   Program Oxygen Prescription Continuous       Liters per minute 1         Home Oxygen   Home Oxygen Device Home Concentrator;E-Tanks       Sleep Oxygen Prescription Continuous       Liters per minute 4       Home Exercise Oxygen Prescription Continuous       Liters per minute 4       Home Resting Oxygen Prescription Continuous       Liters per minute 4       Compliance with Home Oxygen Use No         Goals/Expected Outcomes   Short Term Goals To learn and exhibit compliance with exercise, home and travel O2 prescription;To learn and understand importance of maintaining oxygen saturations>88%;To learn and understand importance of monitoring SPO2 with pulse oximeter and demonstrate accurate use of the pulse oximeter.;To learn and demonstrate proper pursed lip breathing techniques or other breathing techniques.        Long  Term Goals Exhibits compliance with exercise, home  and travel O2 prescription;Maintenance of O2 saturations>88%;Verbalizes importance of monitoring SPO2 with pulse oximeter and return demonstration;Exhibits proper breathing techniques, such as pursed lip breathing or other method taught during program session       Comments pt has only had 2 sessions, used 2L last session for unknown reasons. Will monitor       Goals/Expected Outcomes Compliance and understanding of oxygen saturation monitoring and breathing techniques to decrease shortness of breath.          Oxygen Discharge (Final Oxygen Re-Evaluation):  Oxygen Re-Evaluation - 12/21/23 0917       Program Oxygen Prescription   Program Oxygen Prescription Continuous    Liters per minute 1      Home Oxygen   Home Oxygen Device Home Concentrator;E-Tanks     Sleep Oxygen Prescription Continuous    Liters per minute 4    Home Exercise Oxygen Prescription Continuous    Liters per minute 4    Home Resting Oxygen Prescription Continuous  Liters per minute 4    Compliance with Home Oxygen Use No      Goals/Expected Outcomes   Short Term Goals To learn and exhibit compliance with exercise, home and travel O2 prescription;To learn and understand importance of maintaining oxygen saturations>88%;To learn and understand importance of monitoring SPO2 with pulse oximeter and demonstrate accurate use of the pulse oximeter.;To learn and demonstrate proper pursed lip breathing techniques or other breathing techniques.     Long  Term Goals Exhibits compliance with exercise, home  and travel O2 prescription;Maintenance of O2 saturations>88%;Verbalizes importance of monitoring SPO2 with pulse oximeter and return demonstration;Exhibits proper breathing techniques, such as pursed lip breathing or other method taught during program session    Comments pt has only had 2 sessions, used 2L last session for unknown reasons. Will monitor    Goals/Expected Outcomes Compliance and understanding of oxygen saturation monitoring and breathing techniques to decrease shortness of breath.          Initial Exercise Prescription:  Initial Exercise Prescription - 12/01/23 1400       Date of Initial Exercise RX and Referring Provider   Date 12/01/23    Referring Provider Mannam    Expected Discharge Date 02/29/24      Oxygen   Oxygen Continuous    Liters 1    Maintain Oxygen Saturation 88% or higher      Bike   Level 2    Watts 70    Minutes 15    METs 2.2      Track   Laps 8    Minutes 15    METs 2.2      Prescription Details   Frequency (times per week) 2    Duration Progress to 30 minutes of continuous aerobic without signs/symptoms of physical distress      Intensity   THRR 40-80% of Max Heartrate 60-120    Ratings of Perceived Exertion 11-13     Perceived Dyspnea 0-4      Progression   Progression Continue to progress workloads to maintain intensity without signs/symptoms of physical distress.      Resistance Training   Training Prescription Yes    Weight blue bands    Reps 10-15          Perform Capillary Blood Glucose checks as needed.  Exercise Prescription Changes:   Exercise Prescription Changes     Row Name 07/28/23 1400 08/18/23 1500 09/13/23 1500 09/22/23 1400 09/27/23 1500     Response to Exercise   Blood Pressure (Admit) 128/68 122/70 106/70 112/70 108/62   Blood Pressure (Exercise) 138/78 144/70 -- 114/64 120/70   Blood Pressure (Exit) 112/70 100/70 94/60 94/60  91/52   Heart Rate (Admit) 75 bpm 82 bpm 73 bpm 74 bpm 76 bpm   Heart Rate (Exercise) 102 bpm 107 bpm 89 bpm 91 bpm 90 bpm   Heart Rate (Exit) 81 bpm 81 bpm 78 bpm 76 bpm 82 bpm   Oxygen Saturation (Admit) 87 % -- 91 % 94 % 76 %   Oxygen Saturation (Exercise) 89 % 91 %  2 L O2 via n/c 92 % 93 % 90 %   Oxygen Saturation (Exit) 94 % 93 % 90 % 92 % 82 %   Rating of Perceived Exertion (Exercise) 11 13 11 12 11    Perceived Dyspnea (Exercise) 0.5 1 1 1 1    Symptoms SOB SOB SOB, RPD = 1 SOB, RPD = 1 SOB, RPD = 1, lightheaded after exercise   Comments  Pt's first day in the CRP2 program Reviewed METs Reviewed METs and goals Reviewed home exercise Rx Reviewed METs   Duration Continue with 30 min of aerobic exercise without signs/symptoms of physical distress. Continue with 30 min of aerobic exercise without signs/symptoms of physical distress. Continue with 30 min of aerobic exercise without signs/symptoms of physical distress. Continue with 30 min of aerobic exercise without signs/symptoms of physical distress. Continue with 30 min of aerobic exercise without signs/symptoms of physical distress.   Intensity THRR unchanged THRR unchanged THRR unchanged THRR unchanged THRR unchanged     Progression   Progression Continue to progress workloads to maintain  intensity without signs/symptoms of physical distress. Continue to progress workloads to maintain intensity without signs/symptoms of physical distress. Continue to progress workloads to maintain intensity without signs/symptoms of physical distress. Continue to progress workloads to maintain intensity without signs/symptoms of physical distress. Continue to progress workloads to maintain intensity without signs/symptoms of physical distress.   Average METs 2.15 2.4 2.4 2.6 2.7     Resistance Training   Training Prescription No No Yes No Yes   Weight No weights on wednesdays No weights on wednesdays 3 lb No weights on wednesdays 2 lbs   Reps -- -- 10-15 -- 10-15   Time -- -- 10 Minutes -- 10 Minutes     Interval Training   Interval Training No No No No No     Recumbant Bike   Level 1 1 1 2 2    RPM 62 70 77 69 66   Watts 16 16 21 22 18    Minutes 15 15 15 15 15    METs 2 2 2.2 2.5 2.2     NuStep   Level 1 1 1 2 2    SPM 84 95 85 91 100   Minutes 15 15 15 15 15    METs 2.3 2.8 2.6 2.7 3.2     Home Exercise Plan   Plans to continue exercise at -- -- -- Home (comment) Home (comment)   Frequency -- -- -- Add 2 additional days to program exercise sessions. Add 2 additional days to program exercise sessions.   Initial Home Exercises Provided -- -- -- 09/22/23 09/22/23    Row Name 10/11/23 1600 10/25/23 1700 11/17/23 1400 12/14/23 1300 12/28/23 1400     Response to Exercise   Blood Pressure (Admit) 100/56 110/78 128/62 112/64 106/58   Blood Pressure (Exercise) -- -- -- 108/64 154/62   Blood Pressure (Exit) 120/54 110/60 100/62 94/62 112/70   Heart Rate (Admit) 86 bpm 75 bpm 81 bpm 84 bpm 91 bpm   Heart Rate (Exercise) 103 bpm 92 bpm 103 bpm 105 bpm 114 bpm   Heart Rate (Exit) 86 bpm 84 bpm 89 bpm 96 bpm 94 bpm   Oxygen Saturation (Admit) 91 % 92 % 95 % 92 %  RA 93 %  RA   Oxygen Saturation (Exercise) 90 % 90 % 95 % 92 %  2L 92 %  2L   Oxygen Saturation (Exit) 89 % 91 % 93 % 92 % 92 %  RA    Rating of Perceived Exertion (Exercise) 13 11 12 12 13    Perceived Dyspnea (Exercise) 1 1 0 2 2   Symptoms SOB, RPD = 1, lightheaded after exercise SOB, RPD = 1, lightheaded after exercise None -- --   Comments Reviewed METs and goals Reviewed METs Pt last day in program -- --   Duration Continue with 30 min of aerobic exercise without signs/symptoms  of physical distress. Continue with 30 min of aerobic exercise without signs/symptoms of physical distress. Continue with 30 min of aerobic exercise without signs/symptoms of physical distress. Continue with 30 min of aerobic exercise without signs/symptoms of physical distress. Continue with 30 min of aerobic exercise without signs/symptoms of physical distress.   Intensity THRR unchanged THRR unchanged THRR unchanged THRR unchanged THRR unchanged     Progression   Progression Continue to progress workloads to maintain intensity without signs/symptoms of physical distress. Continue to progress workloads to maintain intensity without signs/symptoms of physical distress. Continue to progress workloads to maintain intensity without signs/symptoms of physical distress. Continue to progress workloads to maintain intensity without signs/symptoms of physical distress. Continue to progress workloads to maintain intensity without signs/symptoms of physical distress.   Average METs 2.75 2.6 2.5 -- --     Resistance Training   Training Prescription Yes Yes No Yes Yes   Weight 2 lbs 2 lbs No weights on Wed blue bands blue bands   Reps 10-15 10-15 -- 10-15 10-15   Time 10 Minutes 5 Minutes -- 10 Minutes 10 Minutes     Interval Training   Interval Training No No No No --     Oxygen   Oxygen -- -- Continuous Continuous Continuous   Liters -- -- 3 0-2 2     Bike   Level -- -- -- 3 3   Minutes -- -- -- 15 15   METs -- -- -- 3.8 4.1     Recumbant Bike   Level 2 2 2  -- --   RPM 79 19 70 -- --   Watts -- 61 15 -- --   Minutes 15 15 15  -- --   METs 2.2  2.1 2 -- --     NuStep   Level 2 2 3  -- --   SPM 104 99 97 -- --   Minutes 15 15 15  -- --   METs 3.3 3 3  -- --     Track   Laps -- -- -- 11 11   Minutes -- -- -- 15 15   METs -- -- -- 2.37 2.37     Home Exercise Plan   Plans to continue exercise at Home (comment) Home (comment) -- -- --   Frequency Add 2 additional days to program exercise sessions. Add 2 additional days to program exercise sessions. -- -- --   Initial Home Exercises Provided 09/22/23 09/22/23 -- -- --     Oxygen   Maintain Oxygen Saturation -- -- -- 88% or higher 88% or higher      Exercise Comments:   Exercise Comments     Row Name 07/28/23 1415 08/18/23 1400 09/13/23 1500 09/22/23 1431 09/27/23 1500   Exercise Comments Pt's first day in the CRP2 program. Pt did have some mild SOB with exercise (RPD = .5). Will continue to montior and progress as tolerated. Reviwed METs. Pt is making slow progress. Order for O2 with exercise was obtained due to patient's drop in SaO2 during exercise. Reviewed METs and goals. Slow progress. Will encourage increase in exercise workloads. Reviewed home exercise Rx. Pt would like to do pool exercise but pools are not open yet. Pt given some links for chair exercise that she can do at home. Pt verbalized understanding of the HERx and was provided a copy. Reviewed METs. Slow progression. Pt lightheaded after exercise. Had not eaten much today. H2) encouraged, given graham crarkers and PNB. Felt fine on D/C.  Row Name 10/25/23 1706 11/17/23 1432 12/07/23 1459       Exercise Comments Reviewed METs. Pt feeling good about her appt with the pulmonologist today. Slow progression. Pt last day in CR, pt plans to go into PR to further streghthen her lungs and increase her stamina. Post QOL scores and knowledge scores could not assessed as she did not bring in her paperwork. Pt completed first day of PR. She exercised for 15 min on the track and bike. Lee averages 2.3 METs on the track and 2.5  METs at level 2 on the bike. Lee performed the warmup and cooldown standing without limitations. Will discuss METs later.        Exercise Goals and Review:   Exercise Goals     Row Name 07/26/23 1623 12/01/23 1313           Exercise Goals   Increase Physical Activity Yes Yes      Intervention Provide advice, education, support and counseling about physical activity/exercise needs.;Develop an individualized exercise prescription for aerobic and resistive training based on initial evaluation findings, risk stratification, comorbidities and participant's personal goals. Provide advice, education, support and counseling about physical activity/exercise needs.;Develop an individualized exercise prescription for aerobic and resistive training based on initial evaluation findings, risk stratification, comorbidities and participant's personal goals.      Expected Outcomes Short Term: Attend rehab on a regular basis to increase amount of physical activity.;Long Term: Exercising regularly at least 3-5 days a week.;Long Term: Add in home exercise to make exercise part of routine and to increase amount of physical activity. Short Term: Attend rehab on a regular basis to increase amount of physical activity.;Long Term: Exercising regularly at least 3-5 days a week.;Long Term: Add in home exercise to make exercise part of routine and to increase amount of physical activity.      Increase Strength and Stamina Yes Yes      Intervention Develop an individualized exercise prescription for aerobic and resistive training based on initial evaluation findings, risk stratification, comorbidities and participant's personal goals.;Provide advice, education, support and counseling about physical activity/exercise needs. Develop an individualized exercise prescription for aerobic and resistive training based on initial evaluation findings, risk stratification, comorbidities and participant's personal goals.;Provide advice,  education, support and counseling about physical activity/exercise needs.      Expected Outcomes Short Term: Increase workloads from initial exercise prescription for resistance, speed, and METs.;Short Term: Perform resistance training exercises routinely during rehab and add in resistance training at home;Long Term: Improve cardiorespiratory fitness, muscular endurance and strength as measured by increased METs and functional capacity ( ) Short Term: Increase workloads from initial exercise prescription for resistance, speed, and METs.;Short Term: Perform resistance training exercises routinely during rehab and add in resistance training at home;Long Term: Improve cardiorespiratory fitness, muscular endurance and strength as measured by increased METs and functional capacity ( )      Able to understand and use rate of perceived exertion (RPE) scale Yes Yes      Intervention Provide education and explanation on how to use RPE scale Provide education and explanation on how to use RPE scale      Expected Outcomes Short Term: Able to use RPE daily in rehab to express subjective intensity level;Long Term:  Able to use RPE to guide intensity level when exercising independently Short Term: Able to use RPE daily in rehab to express subjective intensity level;Long Term:  Able to use RPE to guide intensity level when exercising independently  Able to understand and use Dyspnea scale -- Yes      Intervention -- Provide education and explanation on how to use Dyspnea scale      Expected Outcomes -- Short Term: Able to use Dyspnea scale daily in rehab to express subjective sense of shortness of breath during exertion;Long Term: Able to use Dyspnea scale to guide intensity level when exercising independently      Knowledge and understanding of Target Heart Rate Range (THRR) Yes Yes      Intervention Provide education and explanation of THRR including how the numbers were predicted and where they are located  for reference Provide education and explanation of THRR including how the numbers were predicted and where they are located for reference      Expected Outcomes Short Term: Able to state/look up THRR;Short Term: Able to use daily as guideline for intensity in rehab;Long Term: Able to use THRR to govern intensity when exercising independently Short Term: Able to state/look up THRR;Short Term: Able to use daily as guideline for intensity in rehab;Long Term: Able to use THRR to govern intensity when exercising independently      Understanding of Exercise Prescription Yes Yes      Intervention Provide education, explanation, and written materials on patient's individual exercise prescription Provide education, explanation, and written materials on patient's individual exercise prescription      Expected Outcomes Short Term: Able to explain program exercise prescription;Long Term: Able to explain home exercise prescription to exercise independently Short Term: Able to explain program exercise prescription;Long Term: Able to explain home exercise prescription to exercise independently         Exercise Goals Re-Evaluation :  Exercise Goals Re-Evaluation     Row Name 07/28/23 1414 09/13/23 1500 12/21/23 0919         Exercise Goal Re-Evaluation   Exercise Goals Review Increase Physical Activity;Increase Strength and Stamina;Able to understand and use rate of perceived exertion (RPE) scale;Knowledge and understanding of Target Heart Rate Range (THRR);Understanding of Exercise Prescription Increase Physical Activity;Increase Strength and Stamina;Able to understand and use rate of perceived exertion (RPE) scale;Knowledge and understanding of Target Heart Rate Range (THRR);Understanding of Exercise Prescription Increase Physical Activity;Increase Strength and Stamina;Able to understand and use rate of perceived exertion (RPE) scale;Knowledge and understanding of Target Heart Rate Range (THRR);Understanding of  Exercise Prescription;Able to understand and use Dyspnea scale     Comments Pt's first day in the [facility] program. Pt understands the exercise RX, RPE sclae and THRR. Reviewed METs and goals. Pt voices progress on her goals of increased strength, stamina and energy. Pt has goal of weight loss and has lost 5.6 kg to date. Pt has completed 2 exercise sessions in [facility], missing 2 sessions due to fatigue. She is exercising for 15 min on the track and bike. Lee averages 2.37 METs on the track and 3.8 METs at level 3 on the bike. Jama performs the warmup and cooldown standing without limitations, using blue bands, 5.8 lbs. Will progress as tolerated.     Expected Outcomes Will continue to monitor patient and progress exercise workloads as tolerated. Will continue to monitor patient and progress exercise workloads as tolerated. Will continue to monitor patient and progress exercise workloads as tolerated.        Discharge Exercise Prescription (Final Exercise Prescription Changes):  Exercise Prescription Changes - 12/28/23 1400       Response to Exercise   Blood Pressure (Admit) 106/58    Blood Pressure (Exercise) 154/62  Blood Pressure (Exit) 112/70    Heart Rate (Admit) 91 bpm    Heart Rate (Exercise) 114 bpm    Heart Rate (Exit) 94 bpm    Oxygen Saturation (Admit) 93 %   RA   Oxygen Saturation (Exercise) 92 %   2L   Oxygen Saturation (Exit) 92 %   RA   Rating of Perceived Exertion (Exercise) 13    Perceived Dyspnea (Exercise) 2    Duration Continue with 30 min of aerobic exercise without signs/symptoms of physical distress.    Intensity THRR unchanged      Progression   Progression Continue to progress workloads to maintain intensity without signs/symptoms of physical distress.      Resistance Training   Training Prescription Yes    Weight blue bands    Reps 10-15    Time 10 Minutes      Oxygen   Oxygen Continuous    Liters 2      Bike   Level 3    Minutes 15    METs 4.1      Track    Laps 11    Minutes 15    METs 2.37      Oxygen   Maintain Oxygen Saturation 88% or higher          Nutrition:  Target Goals: Understanding of nutrition guidelines, daily intake of sodium 1500mg , cholesterol 200mg , calories 30% from fat and 7% or less from saturated fats, daily to have 5 or more servings of fruits and vegetables.  Biometrics:  Pre Biometrics - 12/01/23 1442       Pre Biometrics   Grip Strength 21 kg          Post Biometrics - 11/01/23 1704        Post  Biometrics   Height 5' 8 (1.727 m)    Weight 89.7 kg    Waist Circumference 47 inches    Hip Circumference 51 inches    Waist to Hip Ratio 0.92 %    BMI (Calculated) 30.08    Triceps Skinfold 14 mm    % Body Fat 40.4 %    Grip Strength 20 kg    Flexibility 22 in    Single Leg Stand 18.57 seconds          Nutrition Therapy Plan and Nutrition Goals:  Nutrition Therapy & Goals - 12/07/23 1549       Nutrition Therapy   Diet Heart Healthy Diet    Drug/Food Interactions Statins/Certain Fruits      Personal Nutrition Goals   Nutrition Goal Patient to improve diet quality by using the plate method as a guide for meal planning to include lean protein/plant protein, fruits, vegetables, whole grains, nonfat dairy as part of a well-balanced diet.   goal in progress.   Personal Goal #2 Patient to identify strategies for weight loss of 0.5-2.0# per week.   goal in progress.   Comments Goals in progress. Jama has medical history of hyperlipidemia, s/p TAVR, STEMI, CHF, HTN, CAD, carotid artery stenosis. She recently completed intensive cardiac rehab. She is now starting pulmonary rehab for DOE, hemoptysis. She is motivated to lose weight to 170#; she lost about 6.6# throughout cardiac rehab. LDL has improved to 71; she continues zetia , crestor  and heart healthy diet modifications. Patient will benefit from attendance to pulmonary rehab and  adherence to nutrition, exercise, and lifestyle modification.       Intervention Plan   Intervention Prescribe, educate and counsel regarding individualized  specific dietary modifications aiming towards targeted core components such as weight, hypertension, lipid management, diabetes, heart failure and other comorbidities.;Nutrition handout(s) given to patient.    Expected Outcomes Short Term Goal: Understand basic principles of dietary content, such as calories, fat, sodium, cholesterol and nutrients.;Long Term Goal: Adherence to prescribed nutrition plan.          Nutrition Assessments:  Nutrition Assessments - 12/21/23 1515       Rate Your Plate Scores   Pre Score 67         MEDIFICTS Score Key: >=70 Need to make dietary changes  40-70 Heart Healthy Diet <= 40 Therapeutic Level Cholesterol Diet  Flowsheet Row PULMONARY REHAB OTHER RESPIRATORY from 12/21/2023 in [facility]  Picture Your Plate Total Score on Admission 67   Picture Your Plate Scores: <59 Unhealthy dietary pattern with much room for improvement. 41-50 Dietary pattern unlikely to meet recommendations for good health and room for improvement. 51-60 More healthful dietary pattern, with some room for improvement.  >60 Healthy dietary pattern, although there may be some specific behaviors that could be improved.    Nutrition Goals Re-Evaluation:  Nutrition Goals Re-Evaluation     Row Name 08/24/23 1018 09/24/23 1040 10/27/23 0929 11/17/23 1536 12/07/23 1549     Goals   Current Weight 202 lb 6.1 oz (91.8 kg) 199 lb 1.2 oz (90.3 kg) 196 lb 3.4 oz (89 kg) 196 lb 3.4 oz (89 kg) 194 lb 10.7 oz (88.3 kg)   Comment LDL 94, Lpa 227 LDL 71, HDL WNL, Lpa 227 no new labs; most recent labs  LDL 71, HDL WNL, Lpa 227 most recent labs LDL 71, HDL WNL, Lpa 227 most recent labs LDL 71, HDL WNL, Lpa 227   Expected Outcome Goals in progress. Jama has medical history of hyperlipidemia, s/p TAVR, STEMI, CHF, HTN, CAD, carotid artery stenosis. She  continues to attend the [employer] education and nutrition series regularly. She is motivated to lose weight to 170#; however, she has not lost weight since starting with our program. LDL remains above goal; she continues zetia , crestor  and heart healthy diet modifications. Patient will benefit from participation in intensive cardiac rehab for nutrition, exercise, and lifestyle modification. Goals in progress. Jama has medical history of hyperlipidemia, s/p TAVR, STEMI, CHF, HTN, CAD, carotid artery stenosis. She continues to attend the [employer] education and nutrition series regularly. She is motivated to lose weight to 170#; she is down 3.7# since starting with our program. LDL has improved to 71; she continues zetia , crestor  and heart healthy diet modifications. Patient will benefit from participation in intensive cardiac rehab for nutrition, exercise, and lifestyle modification.90.3kg Goals in progress. Jama has medical history of hyperlipidemia, s/p TAVR, STEMI, CHF, HTN, CAD, carotid artery stenosis. She continues to attend the [employer] education and nutrition series regularly. She is motivated to lose weight to 170#; she is down 6.6# since starting with our program. LDL has improved to 71; she continues zetia , crestor  and heart healthy diet modifications. She continues follow-up with pulmonolgy regarding hemoptysis. Patient will benefit from participation in intensive cardiac rehab for nutrition, exercise, and lifestyle modification. Goals in progress. Jama has medical history of hyperlipidemia, s/p TAVR, STEMI, CHF, HTN, CAD, carotid artery stenosis. She has attended the [employer] education and nutrition series regularly. She is motivated to lose weight to 170#; she is down 6.6# since starting with our program. LDL has improved to 71; she continues zetia , crestor  and  heart healthy diet modifications. She continues follow-up with pulmonolgy regarding hemoptysis and plans to start pulmonary rehab. Patient will  benefit from adherence to nutrition, exercise, and lifestyle modification. Goals in progress. Jama has medical history of hyperlipidemia, s/p TAVR, STEMI, CHF, HTN, CAD, carotid artery stenosis. She recently completed intensive cardiac rehab. She is now starting pulmonary rehab for DOE, hemoptysis. She is motivated to lose weight to 170#; she lost about 6.6# throughout cardiac rehab. LDL has improved to 71; she continues zetia , crestor  and heart healthy diet modifications. Patient will benefit from attendance to pulmonary rehab and adherence to nutrition, exercise, and lifestyle modification.      Nutrition Goals Discharge (Final Nutrition Goals Re-Evaluation):  Nutrition Goals Re-Evaluation - 12/07/23 1549       Goals   Current Weight 194 lb 10.7 oz (88.3 kg)    Comment most recent labs LDL 71, HDL WNL, Lpa 227    Expected Outcome Goals in progress. Jama has medical history of hyperlipidemia, s/p TAVR, STEMI, CHF, HTN, CAD, carotid artery stenosis. She recently completed intensive cardiac rehab. She is now starting pulmonary rehab for DOE, hemoptysis. She is motivated to lose weight to 170#; she lost about 6.6# throughout cardiac rehab. LDL has improved to 71; she continues zetia , crestor  and heart healthy diet modifications. Patient will benefit from attendance to pulmonary rehab and adherence to nutrition, exercise, and lifestyle modification.          Psychosocial: Target Goals: Acknowledge presence or absence of significant depression and/or stress, maximize coping skills, provide positive support system. Participant is able to verbalize types and ability to use techniques and skills needed for reducing stress and depression.  Initial Review & Psychosocial Screening:  Initial Psych Review & Screening - 12/01/23 1316       Initial Review   Current issues with Current Anxiety/Panic;Current Psychotropic Meds;Current Stress Concerns    Source of Stress Concerns Chronic Illness;Family     Comments Jama stated she is concerned about her health declining, has stress related to her family      Family Dynamics   Good Support System? Yes    Comments Pt has daughters as support      Barriers   Psychosocial barriers to participate in program The patient should benefit from training in stress management and relaxation.      Screening Interventions   Interventions Encouraged to exercise;Provide feedback about the scores to participant    Expected Outcomes Short Term goal: Utilizing psychosocial counselor, staff and physician to assist with identification of specific Stressors or current issues interfering with healing process. Setting desired goal for each stressor or current issue identified.;Long Term Goal: Stressors or current issues are controlled or eliminated.;Short Term goal: Identification and review with participant of any Quality of Life or Depression concerns found by scoring the questionnaire.;Long Term goal: The participant improves quality of Life and PHQ9 Scores as seen by post scores and/or verbalization of changes          Quality of Life Scores:  Quality of Life - 12/23/23 1541       Quality of Life   Select --   Pt returned paperwork 12/21/23     Quality of Life Scores   Health/Function Pre 20 %    Health/Function Post 24.86 %    Health/Function % Change 24.3 %    Socioeconomic Pre 26.57 %    Socioeconomic Post 29 %    Socioeconomic % Change  9.15 %    Psych/Spiritual Pre 18.66 %  Psych/Spiritual Post 25.71 %    Psych/Spiritual % Change 37.78 %    Family Pre 18 %    Family Post 25.5 %    Family % Change 41.67 %    GLOBAL Pre 21.1 %    GLOBAL Post 25.94 %    GLOBAL % Change 22.94 %         Scores of 19 and below usually indicate a poorer quality of life in these areas.  A difference of  2-3 points is a clinically meaningful difference.  A difference of 2-3 points in the total score of the Quality of Life Index has been associated with significant  improvement in overall quality of life, self-image, physical symptoms, and general health in studies assessing change in quality of life.  PHQ-9: Review Flowsheet  More data exists      12/01/2023 11/17/2023 08/02/2023 07/26/2023 07/05/2023  Depression screen PHQ 2/9  Decreased Interest 0 0 0 1 0  Down, Depressed, Hopeless 0 0 0 0 0  PHQ - 2 Score 0 0 0 1 0  Altered sleeping 1 0 - 2 -  Tired, decreased energy 1 1 - 3 -  Change in appetite 1 0 - 1 -  Feeling bad or failure about yourself  0 0 - 0 -  Trouble concentrating 0 0 - 0 -  Moving slowly or fidgety/restless 0 0 - 0 -  Suicidal thoughts 0 0 - 0 -  PHQ-9 Score 3 1 - 7 -  Difficult doing work/chores Not difficult at all Not difficult at all - Not difficult at all -   Interpretation of Total Score  Total Score Depression Severity:  1-4 = Minimal depression, 5-9 = Mild depression, 10-14 = Moderate depression, 15-19 = Moderately severe depression, 20-27 = Severe depression   Psychosocial Evaluation and Intervention:  Psychosocial Evaluation - 12/01/23 1318       Psychosocial Evaluation & Interventions   Interventions Encouraged to exercise with the program and follow exercise prescription    Comments Jama has an extensive mental health history. She has been under the care of a psychiatrist and pain management MD since she was in her 89s. She continues to see both doctors' regularly. Jama stated she is concerned about her health declining and has stress related to her family. She declines any additional needs, resources, or referrals currently.    Expected Outcomes For Jama to participate in [facility] free of any barriers or concerns    Continue Psychosocial Services  No Follow up required          Psychosocial Re-Evaluation:  Psychosocial Re-Evaluation     Row Name 08/11/23 [id number] 08/19/23 1534 09/21/23 0955 10/12/23 0905 11/09/23 1602     Psychosocial Re-Evaluation   Current issues with Current Stress Concerns;Current  Anxiety/Panic Current Stress Concerns;Current Anxiety/Panic Current Stress Concerns;Current Anxiety/Panic Current Stress Concerns;Current Anxiety/Panic Current Stress Concerns;Current Anxiety/Panic   Comments Reviewed quality of life and PHq2-9.Jama says she is dissatisfied with her health due to her recent MI, Stenting and TAVR. Jama says that her adult children who live with her do not help as they should. Jama denies being depressed currently. Jama takes xanax  for anxiety. Jama says that her anxiety is controlled. Jama sees a psychiatrist once a year.  Offered emotional support and reassurance. Jama continues to voice having family stress with her live in children. Jama also reports having stress about her respitory status as she is now wearing oxygen during exercise. Emotional support provided. Jama continues to  voice having family stress with her live in children. Emotional support provided Jama continues to voice having family stress with her live in children. Emotional support provided. Jama would benifit from couseling. Jama says she is not interested at this time. Jama continues to voice concerns about her  upcoming procedure on 11/17/23 due to hemoptysis.   Expected Outcomes Jama will have decreased or controlled anxiety/ stressors upon completion of cardiac rehab Jama will have decreased or controlled anxiety/ stressors upon completion of cardiac rehab Jama will have decreased or controlled anxiety/ stressors upon completion of cardiac rehab Jama will have decreased or controlled anxiety/ stressors upon completion of cardiac rehab Jama will have decreased or controlled anxiety/ stressors upon completion of cardiac rehab   Interventions Stress management education;Relaxation education;Encouraged to attend Cardiac Rehabilitation for the exercise Stress management education;Relaxation education;Encouraged to attend Cardiac Rehabilitation for the exercise Stress management education;Relaxation education;Encouraged to attend  Cardiac Rehabilitation for the exercise Stress management education;Relaxation education;Encouraged to attend Cardiac Rehabilitation for the exercise Stress management education;Relaxation education;Encouraged to attend Cardiac Rehabilitation for the exercise   Continue Psychosocial Services  Follow up required by staff Follow up required by staff Follow up required by staff Follow up required by staff Follow up required by staff     Initial Review   Source of Stress Concerns Chronic Illness;Family Chronic Illness;Family Chronic Illness;Family Chronic Illness;Family Chronic Illness;Family   Comments Will continue to monitor and offer support as needed. Will continue to monitor and offer support as needed. Will continue to monitor and offer support as needed. Will continue to monitor and offer support as needed. Will continue to monitor and offer support as needed.    Row Name 12/28/23 1229             Psychosocial Re-Evaluation   Current issues with Current Stress Concerns;Current Psychotropic Meds;Current Anxiety/Panic;History of Depression;Current Depression       Comments Monthly psy/soc reevaluation: Jama has an extensive mental health history. She has been under the care of a psychiatrist and pain management MD since she was in her 63s. She continues to see both doctors' regularly. Jama stated she is concerned about her health declining and has stress related to her family. She declines any additional needs, resources, or referrals currently.       Expected Outcomes Jama will have decreased or controlled anxiety/stressors. Jama will to continue to exercise for stress relief and to have a positive outlook with good coping skills to manage her stress and any other psychosocial issues that may arise.       Continue Psychosocial Services  No Follow up required         Initial Review   Source of Stress Concerns --       Comments --          Psychosocial Discharge (Final Psychosocial  Re-Evaluation):  Psychosocial Re-Evaluation - 12/28/23 1229       Psychosocial Re-Evaluation   Current issues with Current Stress Concerns;Current Psychotropic Meds;Current Anxiety/Panic;History of Depression;Current Depression    Comments Monthly psy/soc reevaluation: Jama has an extensive mental health history. She has been under the care of a psychiatrist and pain management MD since she was in her 37s. She continues to see both doctors' regularly. Jama stated she is concerned about her health declining and has stress related to her family. She declines any additional needs, resources, or referrals currently.    Expected Outcomes Jama will have decreased or controlled anxiety/stressors. Jama will to continue to exercise for  stress relief and to have a positive outlook with good coping skills to manage her stress and any other psychosocial issues that may arise.    Continue Psychosocial Services  No Follow up required      Initial Review   Source of Stress Concerns --    Comments --          Education: Education Goals: Education classes will be provided on a weekly basis, covering required topics. Participant will state understanding/return demonstration of topics presented.  Learning Barriers/Preferences:  Learning Barriers/Preferences - 12/01/23 1319       Learning Barriers/Preferences   Learning Barriers None    Learning Preferences Group Instruction;Individual Instruction;Verbal Instruction;Written Material          Education Topics: Know Your Numbers Group instruction that is supported by a PowerPoint presentation. Instructor discusses importance of knowing and understanding resting, exercise, and post-exercise oxygen saturation, heart rate, and blood pressure. Oxygen saturation, heart rate, blood pressure, rating of perceived exertion, and dyspnea are reviewed along with a normal range for these values.    Exercise for the Pulmonary Patient Group instruction that is  supported by a PowerPoint presentation. Instructor discusses benefits of exercise, core components of exercise, frequency, duration, and intensity of an exercise routine, importance of utilizing pulse oximetry during exercise, safety while exercising, and options of places to exercise outside of rehab.    MET Level  Group instruction provided by PowerPoint, verbal discussion, and written material to support subject matter. Instructor reviews what METs are and how to increase METs.    Pulmonary Medications Verbally interactive group education provided by instructor with focus on inhaled medications and proper administration.   Anatomy and Physiology of the Respiratory System Group instruction provided by PowerPoint, verbal discussion, and written material to support subject matter. Instructor reviews respiratory cycle and anatomical components of the respiratory system and their functions. Instructor also reviews differences in obstructive and restrictive respiratory diseases with examples of each.    Oxygen Safety Group instruction provided by PowerPoint, verbal discussion, and written material to support subject matter. There is an overview of "What is Oxygen" and "Why do we need it".  Instructor also reviews how to create a safe environment for oxygen use, the importance of using oxygen as prescribed, and the risks of noncompliance. There is a brief discussion on traveling with oxygen and resources the patient may utilize.   Oxygen Use Group instruction provided by PowerPoint, verbal discussion, and written material to discuss how supplemental oxygen is prescribed and different types of oxygen supply systems. Resources for more information are provided.    Breathing Techniques Group instruction that is supported by demonstration and informational handouts. Instructor discusses the benefits of pursed lip and diaphragmatic breathing and detailed demonstration on how to perform both.      Risk Factor Reduction Group instruction that is supported by a PowerPoint presentation. Instructor discusses the definition of a risk factor, different risk factors for pulmonary disease, and how the heart and lungs work together.   Pulmonary Diseases Group instruction provided by PowerPoint, verbal discussion, and written material to support subject matter. Instructor gives an overview of the different type of pulmonary diseases. There is also a discussion on risk factors and symptoms as well as ways to manage the diseases.   Stress and Energy Conservation Group instruction provided by PowerPoint, verbal discussion, and written material to support subject matter. Instructor gives an overview of stress and the impact it can have on the body. Instructor also reviews  ways to reduce stress. There is also a discussion on energy conservation and ways to conserve energy throughout the day.   Warning Signs and Symptoms Group instruction provided by PowerPoint, verbal discussion, and written material to support subject matter. Instructor reviews warning signs and symptoms of stroke, heart attack, cold and flu. Instructor also reviews ways to prevent the spread of infection.   Other Education Group or individual verbal, written, or video instructions that support the educational goals of the pulmonary rehab program.    Knowledge Questionnaire Score:  Knowledge Questionnaire Score - 12/23/23 1537       Knowledge Questionnaire Score   Post Score 21/24   Pt returned paperwork 12/21/23         Core Components/Risk Factors/Patient Goals at Admission:  Personal Goals and Risk Factors at Admission - 12/01/23 1344       Core Components/Risk Factors/Patient Goals on Admission    Weight Management Yes;Obesity;Weight Loss    Intervention Weight Management: Develop a combined nutrition and exercise program designed to reach desired caloric intake, while maintaining appropriate intake of nutrient and  fiber, sodium and fats, and appropriate energy expenditure required for the weight goal.;Weight Management: Provide education and appropriate resources to help participant work on and attain dietary goals.;Weight Management/Obesity: Establish reasonable short term and long term weight goals.;Obesity: Provide education and appropriate resources to help participant work on and attain dietary goals.    Admit Weight 194 lb 9.6 oz (88.3 kg)    Goal Weight: Short Term 184 lb (83.5 kg)    Goal Weight: Long Term 170 lb (77.1 kg)   pt goal   Expected Outcomes Short Term: Continue to assess and modify interventions until short term weight is achieved;Long Term: Adherence to nutrition and physical activity/exercise program aimed toward attainment of established weight goal;Weight Loss: Understanding of general recommendations for a balanced deficit meal plan, which promotes 1-2 lb weight loss per week and includes a negative energy balance of [phone] kcal/d;Understanding recommendations for meals to include 15-35% energy as protein, 25-35% energy from fat, 35-60% energy from carbohydrates, less than 200mg  of dietary cholesterol, 20-35 gm of total fiber daily;Understanding of distribution of calorie intake throughout the day with the consumption of 4-5 meals/snacks    Tobacco Cessation Yes    Number of packs per day pt states smokes occassionally with dgt    Intervention Assist the participant in steps to quit. Provide individualized education and counseling about committing to Tobacco Cessation, relapse prevention, and pharmacological support that can be provided by physician.;Education officer, environmental, assist with locating and accessing local/national Quit Smoking programs, and support quit date choice.    Expected Outcomes Short Term: Will demonstrate readiness to quit, by selecting a quit date.;Long Term: Complete abstinence from all tobacco products for at least 12 months from quit date.;Short Term: Will  quit all tobacco product use, adhering to prevention of relapse plan.    Improve shortness of breath with ADL's Yes    Intervention Provide education, individualized exercise plan and daily activity instruction to help decrease symptoms of SOB with activities of daily living.    Expected Outcomes Short Term: Improve cardiorespiratory fitness to achieve a reduction of symptoms when performing ADLs;Long Term: Be able to perform more ADLs without symptoms or delay the onset of symptoms    Stress Yes    Intervention Offer individual and/or small group education and counseling on adjustment to heart disease, stress management and health-related lifestyle change. Teach and support self-help strategies.;Refer participants experiencing significant  psychosocial distress to appropriate mental health specialists for further evaluation and treatment. When possible, include family members and significant others in education/counseling sessions.    Expected Outcomes Short Term: Participant demonstrates changes in health-related behavior, relaxation and other stress management skills, ability to obtain effective social support, and compliance with psychotropic medications if prescribed.;Long Term: Emotional wellbeing is indicated by absence of clinically significant psychosocial distress or social isolation.          Core Components/Risk Factors/Patient Goals Review:   Goals and Risk Factor Review     Row Name 08/11/23 9178 08/19/23 1537 09/21/23 0958 10/12/23 0908 11/09/23 1605     Core Components/Risk Factors/Patient Goals Review   Personal Goals Review Weight Management/Obesity;Heart Failure;Lipids;Hypertension;Stress;Improve shortness of breath with ADL's Weight Management/Obesity;Heart Failure;Lipids;Hypertension;Stress;Improve shortness of breath with ADL's Weight Management/Obesity;Heart Failure;Lipids;Hypertension;Stress;Improve shortness of breath with ADL's Weight Management/Obesity;Heart  Failure;Lipids;Hypertension;Stress;Improve shortness of breath with ADL's Weight Management/Obesity;Heart Failure;Lipids;Hypertension;Stress;Improve shortness of breath with ADL's   Review Ximena started cardiac rehab on 07/28/23. Madelena is off to a good start to exercise. oxygen saturatiobns have been consistantly dropping into the mid 80's. Order obtained to use oxygen with exercise. Patient agreeable.  Will use oxygen on next scheduled visit. Rianna is enjoying participating in cardiac rehab so far. Yamina is doing well with exercise at  cardiac rehab. Vital signs and oxygen saturations have been stable now that Jama is wearing oxygen with exercise. oxygen saturations have been in the low to mid 90's on 2l/min. Josey has an upcoming appointment with her pulmonolgist in March. Darneisha is doing well with exercise at  cardiac rehab. Vital signs and oxygen saturations have been stable now that Jama is wearing oxygen with exercise. Oxygen saturations have remained in the low to mid 90's on 2l/min. Jama would benefit from participating in pulmonary rehab when she completes cardiac rehab. Jama says she is more interested in water aerobics. Jama has lost 1.7 kg since starting cardiac rehab. Emiline is doing well with exercise at  cardiac rehab. Vital signs have been stable.  Oxygen saturations have remained in the upper 80's to  mid 90's on 2l/min. Jama would benefit from participating in pulmonary rehab when she completes cardiac rehab. Jama says she is more interested in water aerobics. Jama has lost 2.0 kg since starting cardiac rehab. Will notify Dr Elmira about oxygen saturations. Vollie is doing well with exercise at  cardiac rehab. Vital signs have been stable.  Oxygen saturations have remained in the upper 80's to  mid 90's on 2l/min. Jama would benefit from participating in pulmonary rehab when she completes cardiac rehab. Jama has lost 4 kg since starting cardiac rehab. Jama will complete cardiac rehab on  11/17/23   Expected Outcomes Vickii will continue to participate in cardiac rehab for exercise, nutrition and lifestyle modifications Alithea will continue to participate in cardiac rehab for exercise, nutrition and lifestyle modifications Masa will continue to participate in cardiac rehab for exercise, nutrition and lifestyle modifications Rylei will continue to participate in cardiac rehab for exercise, nutrition and lifestyle modifications Lanell will continue to participate in cardiac rehab for exercise, nutrition and lifestyle modifications    Row Name 12/28/23 1232             Core Components/Risk Factors/Patient Goals Review   Personal Goals Review Weight Management/Obesity;Stress;Improve shortness of breath with ADL's;Develop more efficient breathing techniques such as purse lipped breathing and diaphragmatic breathing and practicing self-pacing with activity.       Review Monthly review of patient's Core  Components/Risk Factors/Patient Goals are as follows: Goal in progress for improving her shortness of breath with ADLs. Jama has been stable on 2L [place]. She has completed 3 sessions so far. Goal progressing on developing more efficient breathing techniques such as purse lipped breathing and diaphragmatic breathing; and practicing self-pacing with activity. She needs to be prompted to initiate pursed lip breathing. She has begun practicing diaphragmatic breathing before bed. She can self-pace herself when needed and allows herself to take breaks while walking or exercising. Goal not met for smoking cessation. Jama has not set a plan. She doesn't have a desire to quit smoking yet. She has been provided resources. Goal progressing for weight loss. Jama is currently down #1.3# since starting the program. She is working with our dietitian to decrease calories. Jama will continue to benefit from PR for nutrition, education, exercise, and lifestyle modification.       Expected Outcomes Pt will show  progress toward meeting expected goals and outcomes.          Core Components/Risk Factors/Patient Goals at Discharge (Final Review):   Goals and Risk Factor Review - 12/28/23 1232       Core Components/Risk Factors/Patient Goals Review   Personal Goals Review Weight Management/Obesity;Stress;Improve shortness of breath with ADL's;Develop more efficient breathing techniques such as purse lipped breathing and diaphragmatic breathing and practicing self-pacing with activity.    Review Monthly review of patient's Core Components/Risk Factors/Patient Goals are as follows: Goal in progress for improving her shortness of breath with ADLs. Jama has been stable on 2L [place]. She has completed 3 sessions so far. Goal progressing on developing more efficient breathing techniques such as purse lipped breathing and diaphragmatic breathing; and practicing self-pacing with activity. She needs to be prompted to initiate pursed lip breathing. She has begun practicing diaphragmatic breathing before bed. She can self-pace herself when needed and allows herself to take breaks while walking or exercising. Goal not met for smoking cessation. Jama has not set a plan. She doesn't have a desire to quit smoking yet. She has been provided resources. Goal progressing for weight loss. Jama is currently down #1.3# since starting the program. She is working with our dietitian to decrease calories. Jama will continue to benefit from PR for nutrition, education, exercise, and lifestyle modification.    Expected Outcomes Pt will show progress toward meeting expected goals and outcomes.         ITP Comments: Pt is making expected progress toward Pulmonary Rehab goals after completing 4 session(s). Recommend continued exercise, life style modification, education, and utilization of breathing techniques to increase stamina and strength, while also decreasing shortness of breath with exertion.  Dr. Slater Staff is Medical Director for  Pulmonary Rehab at [facility].

## 2023-12-30 ENCOUNTER — Encounter (HOSPITAL_COMMUNITY)
Admission: RE | Admit: 2023-12-30 | Discharge: 2023-12-30 | Disposition: A | Discharge status: Home / Self Care | Source: Ambulatory Visit | Attending: Pulmonary Disease | Admitting: Pulmonary Disease

## 2023-12-30 DIAGNOSIS — R0609 Other forms of dyspnea: Secondary | ICD-10-CM

## 2023-12-30 NOTE — Progress Notes (Signed)
 Daily Session Note  Patient Details  Name: Madison West MRN: [id number] Date of Birth: May 04, 1956 Referring Provider:   Conrad Ports Pulmonary Rehab Walk Test from 12/01/2023 in [facility]  Referring Provider Mannam    Encounter Date: 12/30/2023  Check In:  Session Check In - 12/30/23 1419       Check-In   Supervising physician immediately available to respond to emergencies [facility] MD immediately available    Physician(s) Rosabel Mose, NP    Location [facility]    Staff Present Ronal Levin, RN, Maud Moats, MS, ACSM-CEP, Exercise Physiologist;Casey Claudene, RT    Virtual Visit No    Medication changes reported     No    Fall or balance concerns reported    No    Tobacco Cessation No Change    Warm-up and Cool-down Performed as group-led instruction    Resistance Training Performed Yes    VAD Patient? No    PAD/SET Patient? No      Pain Assessment   Currently in Pain? No/denies    Multiple Pain Sites No          Capillary Blood Glucose: No results found for this or any previous visit (from the past 24 hours).    Social History   Tobacco Use  Smoking Status Former   Current packs/day: 0.00   Average packs/day: 0.8 packs/day for 20.0 years (15.0 ttl pk-yrs)   Types: Cigarettes   Start date: 05/06/1977   Quit date: 05/06/1997   Years since quitting: 26.6  Smokeless Tobacco Never    Goals Met:  Proper associated with RPD/PD & O2 Sat Exercise tolerated well No report of concerns or symptoms today Strength training completed today  Goals Unmet:  Not Applicable  Comments: Service time is from 1315 to 1438.    Dr. Slater Staff is Medical Director for Pulmonary Rehab at [facility].

## 2024-01-03 ENCOUNTER — Encounter: Payer: Self-pay | Admitting: Cardiology

## 2024-01-03 ENCOUNTER — Ambulatory Visit (HOSPITAL_COMMUNITY)
Admission: RE | Admit: 2024-01-03 | Discharge: 2024-01-03 | Disposition: A | Discharge status: Home / Self Care | Source: Ambulatory Visit | Attending: Internal Medicine | Admitting: Internal Medicine

## 2024-01-03 DIAGNOSIS — I25118 Atherosclerotic heart disease of native coronary artery with other forms of angina pectoris: Secondary | ICD-10-CM | POA: Diagnosis present

## 2024-01-03 DIAGNOSIS — E782 Mixed hyperlipidemia: Secondary | ICD-10-CM | POA: Diagnosis not present

## 2024-01-03 DIAGNOSIS — R300 Dysuria: Secondary | ICD-10-CM

## 2024-01-03 LAB — MYOCARDIAL PERFUSION IMAGING
Angina Index: 1
Base ST Depression (mm): 0 mm
Duke Treadmill Score: 1
Estimated workload: 7
Exercise duration (min): 5 min
LV dias vol: 188 mL (ref 46–106)
LV sys vol: 119 mL (ref 3.8–5.2)
MPHR: 150 {beats}/min
Nuc Stress EF: 37 %
Peak HR: 130 {beats}/min
Percent HR: 86 %
Rest HR: 77 {beats}/min
Rest Nuclear Isotope Dose: 10.6 mCi
SDS: 18
SRS: 22
SSS: 40
ST Depression (mm): 0 mm
Stress Nuclear Isotope Dose: 31.6 mCi
TID: 1.07

## 2024-01-03 MED ORDER — TECHNETIUM TC 99M TETROFOSMIN IV KIT
10.6000 | PACK | Freq: Once | INTRAVENOUS | Status: AC | PRN
  Administered 2024-01-03: 10.6 via INTRAVENOUS

## 2024-01-03 MED ORDER — TECHNETIUM TC 99M TETROFOSMIN IV KIT
31.6000 | PACK | Freq: Once | INTRAVENOUS | Status: AC | PRN
  Administered 2024-01-03: 31.6 via INTRAVENOUS

## 2024-01-03 MED ORDER — REGADENOSON 0.4 MG/5ML IV SOLN: 0.4000 mg | Freq: Once | INTRAVENOUS | Status: DC

## 2024-01-03 NOTE — Telephone Encounter (Signed)
 I spoke with Madison West about the patches and she is asking about approval. I told her we would let her know about a week before procedure if her insurance will approve the procedure.

## 2024-01-04 ENCOUNTER — Encounter (HOSPITAL_COMMUNITY)
Admission: RE | Admit: 2024-01-04 | Discharge: 2024-01-04 | Disposition: A | Discharge status: Home / Self Care | Source: Ambulatory Visit | Attending: Pulmonary Disease

## 2024-01-04 ENCOUNTER — Ambulatory Visit: Payer: Self-pay | Admitting: Cardiology

## 2024-01-04 DIAGNOSIS — R0609 Other forms of dyspnea: Secondary | ICD-10-CM

## 2024-01-04 LAB — TSH: TSH: 2.55 u[IU]/mL (ref 0.450–4.500)

## 2024-01-04 NOTE — Progress Notes (Signed)
 There is evidence of scar from prior heart attack but do not see any new areas of ischemia. Conitnue current medications.  Thanks MJP

## 2024-01-04 NOTE — Progress Notes (Signed)
 Daily Session Note  Patient Details  Name: Madison West MRN: [id number] Date of Birth: 1956/01/15 Referring Provider:   Conrad Ports Pulmonary Rehab Walk Test from 12/01/2023 in [facility], [facility], & [facility]  Referring Provider Mannam    Encounter Date: 01/04/2024  Check In:  Session Check In - 01/04/24 1348       Check-In   Supervising physician immediately available to respond to emergencies [facility] MD immediately available    Physician(s) Rosabel Mose, NP    [facility]    Staff Present Ronal Levin, RN, Maud Moats, MS, ACSM-CEP, Exercise Physiologist;Casey Claudene Meline Belarus, RD, LDN;Randi [facility], ACSM-CEP, Exercise Physiologist    Virtual Visit No    Medication changes reported     No    Fall or balance concerns reported    No    Tobacco Cessation No Change    Warm-up and Cool-down Performed as group-led instruction    Resistance Training Performed Yes    VAD Patient? No    PAD/SET Patient? No      Pain Assessment   Currently in Pain? No/denies    Multiple Pain Sites No          Capillary Blood Glucose: No results found for this or any previous visit (from the past 24 hours).    Social History   Tobacco Use  Smoking Status Former   Current packs/day: 0.00   Average packs/day: 0.8 packs/day for 20.0 years (15.0 ttl pk-yrs)   Types: Cigarettes   Start date: 05/06/1977   Quit date: 05/06/1997   Years since quitting: 26.6  Smokeless Tobacco Never    Goals Met:  Proper associated with RPD/PD & O2 Sat Exercise tolerated well No report of concerns or symptoms today Strength training completed today  Goals Unmet:  Not Applicable  Comments: Service time is from 1313 to 1445.    Dr. Slater Staff is Medical Director for Pulmonary Rehab at [facility].

## 2024-01-04 NOTE — Progress Notes (Signed)
 Please see separate message re: stress test results. In addition, thyroid  function has normalized after stopping amiodarone .  Thanks MJP

## 2024-01-05 NOTE — Telephone Encounter (Signed)
 Dysuria UA  Thanks MJP

## 2024-01-06 ENCOUNTER — Encounter (HOSPITAL_COMMUNITY)
Admission: RE | Admit: 2024-01-06 | Discharge: 2024-01-06 | Disposition: A | Discharge status: Home / Self Care | Source: Ambulatory Visit | Attending: Pulmonary Disease

## 2024-01-06 DIAGNOSIS — R0609 Other forms of dyspnea: Secondary | ICD-10-CM | POA: Diagnosis not present

## 2024-01-06 NOTE — Progress Notes (Signed)
 Daily Session Note  Patient Details  Name: Kresta Templeman MRN: [id number] Date of Birth: [birth year] Referring Provider:   Conrad Ports Pulmonary Rehab Walk Test from 12/01/2023 in [facility], V[facility], & [facility]  Referring Provider Mannam    Encounter Date: 01/06/2024  Check In:  Session Check In - 01/06/24 1346       Check-In   Supervising physician immediately available to respond to emergencies [facility] MD immediately available    Physician(s) Lum Louis, NP    [facility]    Staff Present Ronal Levin, RN, Maud Moats, MS, ACSM-CEP, Exercise Physiologist;Casey Claudene Meline Belarus, RD, LDN;Randi [facility], ACSM-CEP, Exercise Physiologist    Virtual Visit No    Medication changes reported     No    Fall or balance concerns reported    No    Tobacco Cessation No Change    Warm-up and Cool-down Performed as group-led instruction    Resistance Training Performed Yes    VAD Patient? No    PAD/SET Patient? No      Pain Assessment   Currently in Pain? No/denies    Multiple Pain Sites No          Capillary Blood Glucose: No results found for this or any previous visit (from the past 24 hours).    Social History   Tobacco Use  Smoking Status Former   Current packs/day: 0.00   Average packs/day: 0.8 packs/day for 20.0 years (15.0 ttl pk-yrs)   Types: Cigarettes   Start date: 05/06/1977   Quit date: 05/06/1997   Years since quitting: 26.6  Smokeless Tobacco Never    Goals Met:  Independence with exercise equipment Exercise tolerated well No report of concerns or symptoms today Strength training completed today  Goals Unmet:  Not Applicable  Comments: Service time is from 1319 to 1430    Dr. Slater Staff is Medical Director for Pulmonary Rehab at [facility].

## 2024-01-11 ENCOUNTER — Ambulatory Visit: Payer: Self-pay | Admitting: Cardiology

## 2024-01-11 ENCOUNTER — Encounter (HOSPITAL_COMMUNITY)
Admission: RE | Admit: 2024-01-11 | Discharge: 2024-01-11 | Disposition: A | Discharge status: Home / Self Care | Source: Ambulatory Visit | Attending: Pulmonary Disease | Admitting: Pulmonary Disease

## 2024-01-11 VITALS — Wt 194.0 lb

## 2024-01-11 DIAGNOSIS — R0609 Other forms of dyspnea: Secondary | ICD-10-CM | POA: Diagnosis not present

## 2024-01-11 LAB — LIPID PANEL
Chol/HDL Ratio: 2.9 ratio (ref 0.0–4.4)
Cholesterol, Total: 159 mg/dL (ref 100–199)
HDL: 54 mg/dL (ref >39)
LDL Chol Calc (NIH): 82 mg/dL (ref 0–99)
Triglycerides: 128 mg/dL (ref 0–149)
VLDL Cholesterol Cal: 23 mg/dL (ref 5–40)

## 2024-01-11 LAB — BASIC METABOLIC PANEL WITH GFR
BUN/Creatinine Ratio: 14 (ref 12–28)
BUN: 12 mg/dL (ref 8–27)
CO2: 20 mmol/L (ref 20–29)
Calcium: 9.8 mg/dL (ref 8.7–10.3)
Chloride: 101 mmol/L (ref 96–106)
Creatinine, Ser: 0.88 mg/dL (ref 0.57–1.00)
Glucose: 95 mg/dL (ref 70–99)
Potassium: 4.6 mmol/L (ref 3.5–5.2)
Sodium: 136 mmol/L (ref 134–144)
eGFR: 71 mL/min/1.73 (ref >59)

## 2024-01-11 LAB — URINALYSIS
Bilirubin, UA: NEGATIVE
Ketones, UA: NEGATIVE
Nitrite, UA: POSITIVE — AB
Protein,UA: NEGATIVE
RBC, UA: NEGATIVE
Specific Gravity, UA: 1.01 (ref 1.005–1.030)
Urobilinogen, Ur: 0.2 mg/dL (ref 0.2–1.0)
pH, UA: 6.5 (ref 5.0–7.5)

## 2024-01-11 LAB — PRO B NATRIURETIC PEPTIDE: NT-Pro BNP: 588 pg/mL — ABNORMAL HIGH (ref 0–301)

## 2024-01-11 MED ORDER — SULFAMETHOXAZOLE-TRIMETHOPRIM 800-160 MG PO TABS: 1.0000 | ORAL_TABLET | Freq: Two times a day (BID) | ORAL | 0 refills | Status: AC

## 2024-01-11 NOTE — Progress Notes (Signed)
 Urinalysis shows UTI.  Recommend increasing fluid intake.  In addition, please send Bactrim  DS 800-160 mg tablet 1 tablet twice daily for 3 days.  If there is recurrent UTI, we may need to consider stopping Farxiga .  Thanks MJP

## 2024-01-11 NOTE — Progress Notes (Signed)
 Daily Session Note  Patient Details  Name: Madison West MRN: [id number] Date of Birth: [birth year] Referring Provider:   Conrad Ports Pulmonary Rehab Walk Test from 12/01/2023 in [facility] for Heart, Vascular, & Lung Health  Referring Provider Mannam    Encounter Date: 01/11/2024  Check In:  Session Check In - 01/11/24 1503       Check-In   Supervising physician immediately available to respond to emergencies [facility] MD immediately available    Physician(s) Rosaline Skains, NP    [facility]    Staff Present Ronal Levin, RN, Maud Moats, MS, ACSM-CEP, Exercise Physiologist;Demaria Deeney Claudene Meline Belarus, RD, LDN    Virtual Visit No    Medication changes reported     No    Fall or balance concerns reported    No    Tobacco Cessation No Change    Warm-up and Cool-down Performed as group-led instruction    Resistance Training Performed Yes    VAD Patient? No    PAD/SET Patient? No      Pain Assessment   Currently in Pain? No/denies    Multiple Pain Sites No          Capillary Blood Glucose: No results found for this or any previous visit (from the past 24 hours).   Exercise Prescription Changes - 01/11/24 1500       Response to Exercise   Blood Pressure (Admit) 124/80    Blood Pressure (Exercise) 130/76    Blood Pressure (Exit) 106/62    Heart Rate (Admit) 90 bpm    Heart Rate (Exercise) 104 bpm    Heart Rate (Exit) 91 bpm    Oxygen Saturation (Admit) 91 %    Oxygen Saturation (Exercise) 95 %    Oxygen Saturation (Exit) 94 %    Rating of Perceived Exertion (Exercise) 13    Perceived Dyspnea (Exercise) 1    Duration Continue with 30 min of aerobic exercise without signs/symptoms of physical distress.    Intensity THRR unchanged      Progression   Progression Continue to progress workloads to maintain intensity without signs/symptoms of physical distress.      Resistance Training   Training  Prescription Yes    Weight blue bands    Reps 10-15    Time 10 Minutes      Oxygen   Oxygen Continuous    Liters 1-2      Bike   Level 4    Minutes 15    METs 4.7      Track   Laps 14    Minutes 15    METs 2.7      Oxygen   Maintain Oxygen Saturation 88% or higher          Social History   Tobacco Use  Smoking Status Former   Current packs/day: 0.00   Average packs/day: 0.8 packs/day for 20.0 years (15.0 ttl pk-yrs)   Types: Cigarettes   Start date: 05/06/1977   Quit date: 05/06/1997   Years since quitting: 26.7  Smokeless Tobacco Never    Goals Met:  Proper associated with RPD/PD & O2 Sat Independence with exercise equipment Exercise tolerated well No report of concerns or symptoms today Strength training completed today  Goals Unmet:  Not Applicable  Comments: Service time is from 1337 to 1438.    Dr. Slater Staff is Medical Director for Pulmonary Rehab at [facility].

## 2024-01-13 ENCOUNTER — Encounter (HOSPITAL_COMMUNITY)
Admission: RE | Admit: 2024-01-13 | Discharge: 2024-01-13 | Disposition: A | Discharge status: Home / Self Care | Source: Ambulatory Visit | Attending: Pulmonary Disease

## 2024-01-13 DIAGNOSIS — R0609 Other forms of dyspnea: Secondary | ICD-10-CM

## 2024-01-13 NOTE — Progress Notes (Signed)
 Daily Session Note  Patient Details  Name: Madison West MRN: [id number] Date of Birth: 1956-03-19 Referring Provider:   Conrad Ports Pulmonary Rehab Walk Test from 12/01/2023 in [facility] for Heart, Vascular, & Lung Health  Referring Provider Mannam    Encounter Date: 01/13/2024  Check In:  Session Check In - 01/13/24 1459       Check-In   Supervising physician immediately available to respond to emergencies [facility] MD immediately available    Physician(s) Lum Louis, NP    [facility]    Staff Present Ronal Levin, RN, Maud Moats, MS, ACSM-CEP, Exercise Physiologist;Casey Claudene [place] Quan, RN, BSN    Virtual Visit No    Medication changes reported     No    Fall or balance concerns reported    No    Tobacco Cessation No Change    Warm-up and Cool-down Performed as group-led instruction    Resistance Training Performed Yes    VAD Patient? No      Pain Assessment   Currently in Pain? No/denies    Pain Score 0-No pain    Multiple Pain Sites No          Capillary Blood Glucose: No results found for this or any previous visit (from the past 24 hours).    Social History   Tobacco Use  Smoking Status Former   Current packs/day: 0.00   Average packs/day: 0.8 packs/day for 20.0 years (15.0 ttl pk-yrs)   Types: Cigarettes   Start date: 05/06/1977   Quit date: 05/06/1997   Years since quitting: 26.7  Smokeless Tobacco Never    Goals Met:  Independence with exercise equipment Exercise tolerated well Queuing for purse lip breathing No report of concerns or symptoms today Strength training completed today  Goals Unmet:  Not Applicable  Comments: Service time is from 1328 to 1421    Dr. Slater Staff is Medical Director for Pulmonary Rehab at [facility].

## 2024-01-18 ENCOUNTER — Encounter (HOSPITAL_COMMUNITY)
Admission: RE | Admit: 2024-01-18 | Discharge: 2024-01-18 | Disposition: A | Discharge status: Home / Self Care | Source: Ambulatory Visit | Attending: Pulmonary Disease | Admitting: Pulmonary Disease

## 2024-01-18 DIAGNOSIS — R0609 Other forms of dyspnea: Secondary | ICD-10-CM

## 2024-01-18 NOTE — Progress Notes (Signed)
 Daily Session Note  Patient Details  Name: Madison West MRN: [id number] Date of Birth: 1956/03/20 Referring Provider:   Conrad Ports Pulmonary Rehab Walk Test from 12/01/2023 in [facility]  Referring Provider Mannam    Encounter Date: 01/18/2024  Check In:  Session Check In - 01/18/24 1332       Check-In   Supervising physician immediately available to respond to emergencies [facility] MD immediately available    Physician(s) Barnie Press, NP    Location [facility]    Staff Present Ronal Levin, RN, Maud Moats, MS, ACSM-CEP, Exercise Physiologist;Casey Claudene Meline Belarus, RD, LDN;[facility], ACSM-CEP, Exercise Physiologist    Virtual Visit No    Medication changes reported     No    Fall or balance concerns reported    No    Tobacco Cessation No Change    Warm-up and Cool-down Performed as group-led instruction    Resistance Training Performed Yes    VAD Patient? No    PAD/SET Patient? No      Pain Assessment   Currently in Pain? No/denies    Multiple Pain Sites No          Capillary Blood Glucose: No results found for this or any previous visit (from the past 24 hours).    Social History   Tobacco Use  Smoking Status Former   Current packs/day: 0.00   Average packs/day: 0.8 packs/day for 20.0 years (15.0 ttl pk-yrs)   Types: Cigarettes   Start date: 05/06/1977   Quit date: 05/06/1997   Years since quitting: 26.7  Smokeless Tobacco Never    Goals Met:  Proper associated with RPD/PD & O2 Sat Independence with exercise equipment Exercise tolerated well No report of concerns or symptoms today Strength training completed today  Goals Unmet:  Not Applicable  Comments: Service time is from 1315 to 1440.    Dr. Slater Staff is Medical Director for Pulmonary Rehab at [facility].

## 2024-01-20 ENCOUNTER — Telehealth (HOSPITAL_COMMUNITY): Payer: Self-pay

## 2024-01-20 ENCOUNTER — Encounter (HOSPITAL_COMMUNITY): Admission: RE | Admit: 2024-01-20 | Source: Ambulatory Visit

## 2024-01-20 NOTE — Telephone Encounter (Signed)
 Patient c/o for 1:15 PR class due to a headache.

## 2024-01-24 ENCOUNTER — Telehealth: Payer: Self-pay

## 2024-01-24 DIAGNOSIS — J9601 Acute respiratory failure with hypoxia: Secondary | ICD-10-CM

## 2024-01-24 NOTE — Telephone Encounter (Signed)
 Copied from CRM [id number]. Topic: Clinical - Medication Question >> Jan 24, 2024  3:53 PM Rozanna G wrote: Reason for CRM: PT CALLED STATED SHE WOULD LIKE THE SMALL PORTABLE OXYGEN MACHINE TO [facility] STATED SHE REC'D ONE BUT IT WAS THE BIG ONE AND SHE WANTS THE SMALL PORTABLE OXYGEN MACHINE

## 2024-01-25 ENCOUNTER — Encounter (HOSPITAL_COMMUNITY)
Admission: RE | Admit: 2024-01-25 | Discharge: 2024-01-25 | Disposition: A | Discharge status: Home / Self Care | Source: Ambulatory Visit | Attending: Cardiology | Admitting: Cardiology

## 2024-01-25 ENCOUNTER — Ambulatory Visit: Admitting: Internal Medicine

## 2024-01-25 VITALS — Wt 198.2 lb

## 2024-01-25 DIAGNOSIS — R0609 Other forms of dyspnea: Secondary | ICD-10-CM | POA: Insufficient documentation

## 2024-01-25 NOTE — Progress Notes (Signed)
 Daily Session Note  Patient Details  Name: Madison West MRN: [id number] Date of Birth: 1955-12-14 Referring Provider:   Conrad Ports Pulmonary Rehab Walk Test from 12/01/2023 in [facility]  Referring Provider Mannam    Encounter Date: 01/25/2024  Check In:  Session Check In - 01/25/24 1450       Check-In   Supervising physician immediately available to respond to emergencies [facility] MD immediately available    Physician(s) Josefa Beauvais, NP    Location [facility]    Staff Present Ronal Levin, RN, Maud Moats, MS, ACSM-CEP, Exercise Physiologist;Casey Claudene Idelia Aris BS, ACSM-CEP, Exercise Physiologist    Virtual Visit No    Medication changes reported     No    Fall or balance concerns reported    No    Tobacco Cessation No Change    Warm-up and Cool-down Performed as group-led instruction    Resistance Training Performed Yes    VAD Patient? No    PAD/SET Patient? No      Pain Assessment   Currently in Pain? No/denies    Pain Score 0-No pain    Multiple Pain Sites No          Capillary Blood Glucose: No results found for this or any previous visit (from the past 24 hours).   Exercise Prescription Changes - 01/25/24 1500       Response to Exercise   Blood Pressure (Admit) 126/66    Blood Pressure (Exercise) 160/70    Blood Pressure (Exit) 102/62    Heart Rate (Admit) 97 bpm    Heart Rate (Exercise) 119 bpm    Heart Rate (Exit) 94 bpm    Oxygen Saturation (Admit) 91 %    Oxygen Saturation (Exercise) 91 %    Oxygen Saturation (Exit) 94 %    Rating of Perceived Exertion (Exercise) 13    Perceived Dyspnea (Exercise) 2    Duration Continue with 30 min of aerobic exercise without signs/symptoms of physical distress.    Intensity THRR unchanged      Progression   Progression Continue to progress workloads to maintain intensity without signs/symptoms of physical distress.       Resistance Training   Training Prescription Yes    Weight blue bands    Reps 10-15    Time 10 Minutes      Oxygen   Oxygen Continuous    Liters 1-2      Bike   Level 3.3    Minutes 15    METs 4.6      Track   Laps 12    Minutes 15    METs 2.5      Oxygen   Maintain Oxygen Saturation 88% or higher          Social History   Tobacco Use  Smoking Status Former   Current packs/day: 0.00   Average packs/day: 0.8 packs/day for 20.0 years (15.0 ttl pk-yrs)   Types: Cigarettes   Start date: 05/06/1977   Quit date: 05/06/1997   Years since quitting: 26.7  Smokeless Tobacco Never    Goals Met:  Independence with exercise equipment Exercise tolerated well No report of concerns or symptoms today Strength training completed today  Goals Unmet:  Not Applicable  Comments: Service time is from 1333 to 1433.    Dr. Slater Staff is Medical Director for Pulmonary Rehab at [facility].

## 2024-01-26 NOTE — Telephone Encounter (Deleted)
 Patient order was requested to be sent to [facility] by Dr.Dewald on 11/10/2023. Order has already been received by the patient. I will contact the patient.

## 2024-01-26 NOTE — Progress Notes (Signed)
 Pulmonary Individual Treatment Plan  Patient Details  Name: Madison West MRN: [id number] Date of Birth: 07-29-1955 Referring Provider:   Conrad Ports Pulmonary Rehab Walk Test from 12/01/2023 in [facility]  Referring Provider Mannam    Initial Encounter Date:  Flowsheet Row Pulmonary Rehab Walk Test from 12/01/2023 in [facility]  Date 12/01/23    Visit Diagnosis: DOE (dyspnea on exertion)  Patient's Home Medications on Admission:   Current Outpatient Medications:    acetaminophen  (TYLENOL ) 325 MG tablet, Take 2 tablets (650 mg total) by mouth every 6 (six) hours., Disp: , Rfl:    alprazolam  (XANAX ) 2 MG tablet, Take 0.25-2 mg by mouth See admin instructions. 1 mg (may take up to 2 mg) twice daily. May also take 0.25 mg during the day if needed for anxiety., Disp: , Rfl:    aspirin  EC 81 MG tablet, Take 1 tablet (81 mg total) by mouth daily. Swallow whole., Disp: 90 tablet, Rfl: 3   busPIRone  (BUSPAR ) 10 MG tablet, TAKE 1 TABLET BY MOUTH TWICE A DAY, Disp: 180 tablet, Rfl: 1   clopidogrel  (PLAVIX ) 75 MG tablet, Take 1 tablet (75 mg total) by mouth daily., Disp: 90 tablet, Rfl: 3   dapagliflozin  propanediol (FARXIGA ) 10 MG TABS tablet, Take 1 tablet (10 mg total) by mouth daily before breakfast., Disp: 90 tablet, Rfl: 3   furosemide  (LASIX ) 20 MG tablet, Take 0.5 tablets (10 mg total) by mouth daily as needed (for lower extremity swelling and weight gain)., Disp: 30 tablet, Rfl: 3   losartan  (COZAAR ) 25 MG tablet, Take 1 tablet (25 mg total) by mouth daily., Disp: 90 tablet, Rfl: 3   methocarbamol  (ROBAXIN ) 500 MG tablet, Take 1 tablet (500 mg total) by mouth 2 (two) times daily., Disp: 60 tablet, Rfl: 0   nitroGLYCERIN  (NITROSTAT ) 0.4 MG SL tablet, Place 1 tablet (0.4 mg total) under the tongue every 5 (five) minutes as needed for chest pain., Disp: 25 tablet, Rfl:  2   oxyCODONE  (ROXICODONE ) 5 MG immediate release tablet, Take 1 tablet (5 mg total) by mouth daily as needed for severe pain (pain score 7-10)., Disp: 30 tablet, Rfl: 0   rosuvastatin  (CRESTOR ) 10 MG tablet, Take 1 tablet (10 mg total) by mouth daily., Disp: 90 tablet, Rfl: 3   spironolactone  (ALDACTONE ) 25 MG tablet, Take 1 tablet (25 mg total) by mouth daily., Disp: 90 tablet, Rfl: 3  Past Medical History: Past Medical History:  Diagnosis Date   Anxiety    Aortic stenosis    s/p TAVR 02/04/23   Arthritis    low back and hip pain intermittent   Breast cancer (HCC) 06/02/2007   r breast -surgery ,radiaology. chemotherapy   Carotid artery disease (HCC)    Carpal tunnel syndrome    right hand   CHF (congestive heart failure) (HCC)    Colon polyps    Complication of anesthesia    Fentanyl , Versed -makes extra hyper, bradycardia x 1 in PACU, [facility] (08/15/11 cardiology felt neostigmine may have resulted in AV nodal block)    Coronary artery disease    Depression    denies   DVT (deep venous thrombosis) (HCC)    provoked left IJ 02/10/23   Dysplasia of vulva    Hypertension    Palpitations    PSVT, s/p adenosine  08/04/16   S/P breast lumpectomy 07/04/2007   R breast   S/P radiation  therapy 2009   Vulva cancer (HCC)     Tobacco Use: Social History   Tobacco Use  Smoking Status Former   Current packs/day: 0.00   Average packs/day: 0.8 packs/day for 20.0 years (15.0 ttl pk-yrs)   Types: Cigarettes   Start date: 05/06/1977   Quit date: 05/06/1997   Years since quitting: 26.7  Smokeless Tobacco Never    Labs: Review Flowsheet  More data exists      Latest Ref Rng & Units 02/13/2023 02/14/2023 06/21/2023 09/02/2023 01/10/2024  Labs for ITP Cardiac and Pulmonary Rehab  Cholestrol 100 - 199 mg/dL - - 815  856  840   LDL (calc) 0 - 99 mg/dL - - 94  71  82   HDL-C >39 mg/dL - - 67  54  54   Trlycerides 0 - 149 mg/dL - - 867  99  871   O2 Saturation % 68.3  60.4  - - -     Capillary Blood Glucose: Lab Results  Component Value Date   GLUCAP 87 02/19/2023   GLUCAP 103 (H) 02/19/2023   GLUCAP 121 (H) 02/18/2023   GLUCAP 111 (H) 02/18/2023   GLUCAP 110 (H) 02/17/2023     Pulmonary Assessment Scores:  Pulmonary Assessment Scores     Row Name 12/01/23 1314         ADL UCSD   ADL Phase Entry     SOB Score total 28       CAT Score   CAT Score 10       mMRC Score   mMRC Score 2       UCSD: Self-administered rating of dyspnea associated with activities of daily living (ADLs) 6-point scale (0 = not at all to 5 = maximal or unable to do because of breathlessness)  Scoring Scores range from 0 to 120.  Minimally important difference is 5 units  CAT: CAT can identify the health impairment of COPD patients and is better correlated with disease progression.  CAT has a scoring range of zero to 40. The CAT score is classified into four groups of low (less than 10), medium (10 - 20), high (21-30) and very high (31-40) based on the impact level of disease on health status. A CAT score over 10 suggests significant symptoms.  A worsening CAT score could be explained by an exacerbation, poor medication adherence, poor inhaler technique, or progression of COPD or comorbid conditions.  CAT MCID is 2 points  mMRC: mMRC (Modified Medical Research Council) Dyspnea Scale is used to assess the degree of baseline functional disability in patients of respiratory disease due to dyspnea. No minimal important difference is established. A decrease in score of 1 point or greater is considered a positive change.   Pulmonary Function Assessment:  Pulmonary Function Assessment - 12/01/23 1522       Breath   Bilateral Breath Sounds Decreased    Shortness of Breath Yes;Limiting activity          Exercise Target Goals: Exercise Program Goal: Individual exercise prescription set using results from initial 6 min walk test and THRR while considering  patient's  activity barriers and safety.   Exercise Prescription Goal: Initial exercise prescription builds to 30-45 minutes a day of aerobic activity, 2-3 days per week.  Home exercise guidelines will be given to patient during program as part of exercise prescription that the participant will acknowledge.  Activity Barriers & Risk Stratification:  Activity Barriers & Cardiac Risk Stratification - 12/01/23 1312  Activity Barriers & Cardiac Risk Stratification   Activity Barriers Deconditioning;Back Problems;Neck/Spine Problems;Balance Concerns;Decreased Ventricular Function;Joint Problems;Muscular Weakness    Cardiac Risk Stratification --          6 Minute Walk:  6 Minute Walk     Row Name 11/01/23 1300 12/01/23 1439       6 Minute Walk   Phase Discharge Initial    Distance 1298 feet 1170 feet    Distance % Change 10 % --    Distance Feet Change 118 ft --    Walk Time 6 minutes 6 minutes    # of Rest Breaks 0 0    MPH 2.5 2.22    METS 2.91 2.81    RPE 13 12    Perceived Dyspnea  1 1    VO2 Peak 10.2 9.83    Symptoms Yes (comment) Yes (comment)  c/o 3/10 hip pain with distance    Comments SaO2 dropped to 88% on 2L @ 4 minutes-, increased to 3L. Pt had mils SOB, RPD = 1.  2/10 bilateral leg pain --    Resting HR 81 bpm 85 bpm    Resting BP 120/60 100/58    Resting Oxygen Saturation  90 % 97 %    Exercise Oxygen Saturation  during 6 min walk 88 % 87 %    Max Ex. HR 108 bpm 112 bpm    Max Ex. BP 140/60 146/70    2 Minute Post BP -- 130/70      Interval HR   1 Minute HR -- 99    2 Minute HR -- 104    3 Minute HR -- 109    4 Minute HR -- 112    5 Minute HR -- 112    6 Minute HR -- 112    2 Minute Post HR -- 93    Interval Heart Rate? -- Yes      Interval Oxygen   Interval Oxygen? Yes Yes    Baseline Oxygen Saturation % -- 97 %    1 Minute Oxygen Saturation % 93 % 89 %    1 Minute Liters of Oxygen 2 L 0 L    2 Minute Oxygen Saturation % 89 % 91 %  87 RA 1:40, up to 1L     2 Minute Liters of Oxygen 2 L 1 L    3 Minute Oxygen Saturation % 89 % 93 %    3 Minute Liters of Oxygen 2 L 1 L    4 Minute Oxygen Saturation % 88 % 92 %    4 Minute Liters of Oxygen 2 L  increased to 3L 1 L    5 Minute Oxygen Saturation % 91 % 93 %    5 Minute Liters of Oxygen 3 L 1 L    6 Minute Oxygen Saturation % 91 % 92 %    6 Minute Liters of Oxygen 3 L 1 L    2 Minute Post Oxygen Saturation % 93 % 97 %    2 Minute Post Liters of Oxygen 2 L 1 L       Oxygen Initial Assessment:  Oxygen Initial Assessment - 12/01/23 1313       Home Oxygen   Home Oxygen Device Home Concentrator;E-Tanks    Sleep Oxygen Prescription Continuous    Liters per minute 4    Home Exercise Oxygen Prescription Continuous    Liters per minute 4    Home Resting Oxygen Prescription Continuous  Liters per minute 4    Compliance with Home Oxygen Use No      Initial 6 min Walk   Oxygen Used Continuous    Liters per minute 1      Program Oxygen Prescription   Program Oxygen Prescription Continuous    Liters per minute 1      Intervention   Short Term Goals To learn and exhibit compliance with exercise, home and travel O2 prescription;To learn and understand importance of maintaining oxygen saturations>88%;To learn and understand importance of monitoring SPO2 with pulse oximeter and demonstrate accurate use of the pulse oximeter.;To learn and demonstrate proper pursed lip breathing techniques or other breathing techniques.     Long  Term Goals Exhibits compliance with exercise, home  and travel O2 prescription;Maintenance of O2 saturations>88%;Verbalizes importance of monitoring SPO2 with pulse oximeter and return demonstration;Exhibits proper breathing techniques, such as pursed lip breathing or other method taught during program session          Oxygen Re-Evaluation:  Oxygen Re-Evaluation     Row Name 12/21/23 0917 01/20/24 0740           Program Oxygen Prescription   Program Oxygen  Prescription Continuous Continuous      Liters per minute 1 1      Comments -- can intermittently exercise on RA. Fluctuates between 0-2L        Home Oxygen   Home Oxygen Device Home Concentrator;E-Tanks Home Concentrator;E-Tanks      Sleep Oxygen Prescription Continuous Continuous      Liters per minute 4 4      Home Exercise Oxygen Prescription Continuous Continuous      Liters per minute 4 4      Home Resting Oxygen Prescription Continuous Continuous      Liters per minute 4 4      Compliance with Home Oxygen Use No No        Goals/Expected Outcomes   Short Term Goals To learn and exhibit compliance with exercise, home and travel O2 prescription;To learn and understand importance of maintaining oxygen saturations>88%;To learn and understand importance of monitoring SPO2 with pulse oximeter and demonstrate accurate use of the pulse oximeter.;To learn and demonstrate proper pursed lip breathing techniques or other breathing techniques.  To learn and exhibit compliance with exercise, home and travel O2 prescription;To learn and understand importance of maintaining oxygen saturations>88%;To learn and understand importance of monitoring SPO2 with pulse oximeter and demonstrate accurate use of the pulse oximeter.;To learn and demonstrate proper pursed lip breathing techniques or other breathing techniques.       Long  Term Goals Exhibits compliance with exercise, home  and travel O2 prescription;Maintenance of O2 saturations>88%;Verbalizes importance of monitoring SPO2 with pulse oximeter and return demonstration;Exhibits proper breathing techniques, such as pursed lip breathing or other method taught during program session Exhibits compliance with exercise, home  and travel O2 prescription;Maintenance of O2 saturations>88%;Verbalizes importance of monitoring SPO2 with pulse oximeter and return demonstration;Exhibits proper breathing techniques, such as pursed lip breathing or other method taught during  program session      Comments pt has only had 2 sessions, used 2L last session for unknown reasons. Will monitor 0-2L      Goals/Expected Outcomes Compliance and understanding of oxygen saturation monitoring and breathing techniques to decrease shortness of breath. Compliance and understanding of oxygen saturation monitoring and breathing techniques to decrease shortness of breath.         Oxygen Discharge (Final Oxygen Re-Evaluation):  Oxygen  Re-Evaluation - 01/20/24 0740       Program Oxygen Prescription   Program Oxygen Prescription Continuous    Liters per minute 1    Comments can intermittently exercise on RA. Fluctuates between 0-2L      Home Oxygen   Home Oxygen Device Home Concentrator;E-Tanks    Sleep Oxygen Prescription Continuous    Liters per minute 4    Home Exercise Oxygen Prescription Continuous    Liters per minute 4    Home Resting Oxygen Prescription Continuous    Liters per minute 4    Compliance with Home Oxygen Use No      Goals/Expected Outcomes   Short Term Goals To learn and exhibit compliance with exercise, home and travel O2 prescription;To learn and understand importance of maintaining oxygen saturations>88%;To learn and understand importance of monitoring SPO2 with pulse oximeter and demonstrate accurate use of the pulse oximeter.;To learn and demonstrate proper pursed lip breathing techniques or other breathing techniques.     Long  Term Goals Exhibits compliance with exercise, home  and travel O2 prescription;Maintenance of O2 saturations>88%;Verbalizes importance of monitoring SPO2 with pulse oximeter and return demonstration;Exhibits proper breathing techniques, such as pursed lip breathing or other method taught during program session    Comments 0-2L    Goals/Expected Outcomes Compliance and understanding of oxygen saturation monitoring and breathing techniques to decrease shortness of breath.          Initial Exercise Prescription:  Initial  Exercise Prescription - 12/01/23 1400       Date of Initial Exercise RX and Referring Provider   Date 12/01/23    Referring Provider Mannam    Expected Discharge Date 02/29/24      Oxygen   Oxygen Continuous    Liters 1    Maintain Oxygen Saturation 88% or higher      Bike   Level 2    Watts 70    Minutes 15    METs 2.2      Track   Laps 8    Minutes 15    METs 2.2      Prescription Details   Frequency (times per week) 2    Duration Progress to 30 minutes of continuous aerobic without signs/symptoms of physical distress      Intensity   THRR 40-80% of Max Heartrate 60-120    Ratings of Perceived Exertion 11-13    Perceived Dyspnea 0-4      Progression   Progression Continue to progress workloads to maintain intensity without signs/symptoms of physical distress.      Resistance Training   Training Prescription Yes    Weight blue bands    Reps 10-15          Perform Capillary Blood Glucose checks as needed.  Exercise Prescription Changes:   Exercise Prescription Changes     Row Name 08/18/23 1500 09/13/23 1500 09/22/23 1400 09/27/23 1500 10/11/23 1600     Response to Exercise   Blood Pressure (Admit) 122/70 106/70 112/70 108/62 100/56   Blood Pressure (Exercise) 144/70 -- 114/64 120/70 --   Blood Pressure (Exit) 100/70 94/60 94/60  91/52 120/54   Heart Rate (Admit) 82 bpm 73 bpm 74 bpm 76 bpm 86 bpm   Heart Rate (Exercise) 107 bpm 89 bpm 91 bpm 90 bpm 103 bpm   Heart Rate (Exit) 81 bpm 78 bpm 76 bpm 82 bpm 86 bpm   Oxygen Saturation (Admit) -- 91 % 94 % 76 % 91 %   Oxygen  Saturation (Exercise) 91 %  2 L O2 via n/c 92 % 93 % 90 % 90 %   Oxygen Saturation (Exit) 93 % 90 % 92 % 82 % 89 %   Rating of Perceived Exertion (Exercise) 13 11 12 11 13    Perceived Dyspnea (Exercise) 1 1 1 1 1    Symptoms SOB SOB, RPD = 1 SOB, RPD = 1 SOB, RPD = 1, lightheaded after exercise SOB, RPD = 1, lightheaded after exercise   Comments Reviewed METs Reviewed METs and goals  Reviewed home exercise Rx Reviewed METs Reviewed METs and goals   Duration Continue with 30 min of aerobic exercise without signs/symptoms of physical distress. Continue with 30 min of aerobic exercise without signs/symptoms of physical distress. Continue with 30 min of aerobic exercise without signs/symptoms of physical distress. Continue with 30 min of aerobic exercise without signs/symptoms of physical distress. Continue with 30 min of aerobic exercise without signs/symptoms of physical distress.   Intensity THRR unchanged THRR unchanged THRR unchanged THRR unchanged THRR unchanged     Progression   Progression Continue to progress workloads to maintain intensity without signs/symptoms of physical distress. Continue to progress workloads to maintain intensity without signs/symptoms of physical distress. Continue to progress workloads to maintain intensity without signs/symptoms of physical distress. Continue to progress workloads to maintain intensity without signs/symptoms of physical distress. Continue to progress workloads to maintain intensity without signs/symptoms of physical distress.   Average METs 2.4 2.4 2.6 2.7 2.75     Resistance Training   Training Prescription No Yes No Yes Yes   Weight No weights on wednesdays 3 lb No weights on wednesdays 2 lbs 2 lbs   Reps -- 10-15 -- 10-15 10-15   Time -- 10 Minutes -- 10 Minutes 10 Minutes     Interval Training   Interval Training No No No No No     Recumbant Bike   Level 1 1 2 2 2    RPM 70 77 69 66 79   Watts 16 21 22 18  --   Minutes 15 15 15 15 15    METs 2 2.2 2.5 2.2 2.2     NuStep   Level 1 1 2 2 2    SPM 95 85 91 100 104   Minutes 15 15 15 15 15    METs 2.8 2.6 2.7 3.2 3.3     Home Exercise Plan   Plans to continue exercise at -- -- Home (comment) Home (comment) Home (comment)   Frequency -- -- Add 2 additional days to program exercise sessions. Add 2 additional days to program exercise sessions. Add 2 additional days to  program exercise sessions.   Initial Home Exercises Provided -- -- 09/22/23 09/22/23 09/22/23    Row Name 10/25/23 1700 11/17/23 1400 12/14/23 1300 12/28/23 1400 01/11/24 1500     Response to Exercise   Blood Pressure (Admit) 110/78 128/62 112/64 106/58 124/80   Blood Pressure (Exercise) -- -- 108/64 154/62 130/76   Blood Pressure (Exit) 110/60 100/62 94/62 112/70 106/62   Heart Rate (Admit) 75 bpm 81 bpm 84 bpm 91 bpm 90 bpm   Heart Rate (Exercise) 92 bpm 103 bpm 105 bpm 114 bpm 104 bpm   Heart Rate (Exit) 84 bpm 89 bpm 96 bpm 94 bpm 91 bpm   Oxygen Saturation (Admit) 92 % 95 % 92 %  RA 93 %  RA 91 %   Oxygen Saturation (Exercise) 90 % 95 % 92 %  2L 92 %  2L 95 %  Oxygen Saturation (Exit) 91 % 93 % 92 % 92 %  RA 94 %   Rating of Perceived Exertion (Exercise) 11 12 12 13 13    Perceived Dyspnea (Exercise) 1 0 2 2 1    Symptoms SOB, RPD = 1, lightheaded after exercise None -- -- --   Comments Reviewed METs Pt last day in program -- -- --   Duration Continue with 30 min of aerobic exercise without signs/symptoms of physical distress. Continue with 30 min of aerobic exercise without signs/symptoms of physical distress. Continue with 30 min of aerobic exercise without signs/symptoms of physical distress. Continue with 30 min of aerobic exercise without signs/symptoms of physical distress. Continue with 30 min of aerobic exercise without signs/symptoms of physical distress.   Intensity THRR unchanged THRR unchanged THRR unchanged THRR unchanged THRR unchanged     Progression   Progression Continue to progress workloads to maintain intensity without signs/symptoms of physical distress. Continue to progress workloads to maintain intensity without signs/symptoms of physical distress. Continue to progress workloads to maintain intensity without signs/symptoms of physical distress. Continue to progress workloads to maintain intensity without signs/symptoms of physical distress. Continue to progress  workloads to maintain intensity without signs/symptoms of physical distress.   Average METs 2.6 2.5 -- -- --     Resistance Training   Training Prescription Yes No Yes Yes Yes   Weight 2 lbs No weights on Wed blue bands blue bands blue bands   Reps 10-15 -- 10-15 10-15 10-15   Time 5 Minutes -- 10 Minutes 10 Minutes 10 Minutes     Interval Training   Interval Training No No No -- --     Oxygen   Oxygen -- Continuous Continuous Continuous Continuous   Liters -- 3 0-2 2 1-2     Bike   Level -- -- 3 3 4    Minutes -- -- 15 15 15    METs -- -- 3.8 4.1 4.7     Recumbant Bike   Level 2 2 -- -- --   RPM 19 70 -- -- --   Watts 61 15 -- -- --   Minutes 15 15 -- -- --   METs 2.1 2 -- -- --     NuStep   Level 2 3 -- -- --   SPM 99 97 -- -- --   Minutes 15 15 -- -- --   METs 3 3 -- -- --     Track   Laps -- -- 11 11 14    Minutes -- -- 15 15 15    METs -- -- 2.37 2.37 2.7     Home Exercise Plan   Plans to continue exercise at Home (comment) -- -- -- --   Frequency Add 2 additional days to program exercise sessions. -- -- -- --   Initial Home Exercises Provided 09/22/23 -- -- -- --     Oxygen   Maintain Oxygen Saturation -- -- 88% or higher 88% or higher 88% or higher    Row Name 01/25/24 1500             Response to Exercise   Blood Pressure (Admit) 126/66       Blood Pressure (Exercise) 160/70       Blood Pressure (Exit) 102/62       Heart Rate (Admit) 97 bpm       Heart Rate (Exercise) 119 bpm       Heart Rate (Exit) 94 bpm       Oxygen  Saturation (Admit) 91 %       Oxygen Saturation (Exercise) 91 %       Oxygen Saturation (Exit) 94 %       Rating of Perceived Exertion (Exercise) 13       Perceived Dyspnea (Exercise) 2       Duration Continue with 30 min of aerobic exercise without signs/symptoms of physical distress.       Intensity THRR unchanged         Progression   Progression Continue to progress workloads to maintain intensity without signs/symptoms of  physical distress.         Resistance Training   Training Prescription Yes       Weight blue bands       Reps 10-15       Time 10 Minutes         Oxygen   Oxygen Continuous       Liters 1-2         Bike   Level 3.3       Minutes 15       METs 4.6         Track   Laps 12       Minutes 15       METs 2.5         Oxygen   Maintain Oxygen Saturation 88% or higher          Exercise Comments:   Exercise Comments     Row Name 08/18/23 1400 09/13/23 1500 09/22/23 1431 09/27/23 1500 10/25/23 1706   Exercise Comments Reviwed METs. Pt is making slow progress. Order for O2 with exercise was obtained due to patient's drop in SaO2 during exercise. Reviewed METs and goals. Slow progress. Will encourage increase in exercise workloads. Reviewed home exercise Rx. Pt would like to do pool exercise but pools are not open yet. Pt given some links for chair exercise that she can do at home. Pt verbalized understanding of the HERx and was provided a copy. Reviewed METs. Slow progression. Pt lightheaded after exercise. Had not eaten much today. H2) encouraged, given graham crarkers and PNB. Felt fine on D/C. Reviewed METs. Pt feeling good about her appt with the pulmonologist today. Slow progression.    Row Name 11/17/23 1432 12/07/23 1459         Exercise Comments Pt last day in CR, pt plans to go into PR to further streghthen her lungs and increase her stamina. Post QOL scores and knowledge scores could not assessed as she did not bring in her paperwork. Pt completed first day of PR. She exercised for 15 min on the track and bike. Lee averages 2.3 METs on the track and 2.5 METs at level 2 on the bike. Lee performed the warmup and cooldown standing without limitations. Will discuss METs later.         Exercise Goals and Review:   Exercise Goals     Row Name 12/01/23 1313             Exercise Goals   Increase Physical Activity Yes       Intervention Provide advice, education, support and  counseling about physical activity/exercise needs.;Develop an individualized exercise prescription for aerobic and resistive training based on initial evaluation findings, risk stratification, comorbidities and participant's personal goals.       Expected Outcomes Short Term: Attend rehab on a regular basis to increase amount of physical activity.;Long Term: Exercising regularly at least 3-5 days a  week.;Long Term: Add in home exercise to make exercise part of routine and to increase amount of physical activity.       Increase Strength and Stamina Yes       Intervention Develop an individualized exercise prescription for aerobic and resistive training based on initial evaluation findings, risk stratification, comorbidities and participant's personal goals.;Provide advice, education, support and counseling about physical activity/exercise needs.       Expected Outcomes Short Term: Increase workloads from initial exercise prescription for resistance, speed, and METs.;Short Term: Perform resistance training exercises routinely during rehab and add in resistance training at home;Long Term: Improve cardiorespiratory fitness, muscular endurance and strength as measured by increased METs and functional capacity ( )       Able to understand and use rate of perceived exertion (RPE) scale Yes       Intervention Provide education and explanation on how to use RPE scale       Expected Outcomes Short Term: Able to use RPE daily in rehab to express subjective intensity level;Long Term:  Able to use RPE to guide intensity level when exercising independently       Able to understand and use Dyspnea scale Yes       Intervention Provide education and explanation on how to use Dyspnea scale       Expected Outcomes Short Term: Able to use Dyspnea scale daily in rehab to express subjective sense of shortness of breath during exertion;Long Term: Able to use Dyspnea scale to guide intensity level when exercising independently        Knowledge and understanding of Target Heart Rate Range (THRR) Yes       Intervention Provide education and explanation of THRR including how the numbers were predicted and where they are located for reference       Expected Outcomes Short Term: Able to state/look up THRR;Short Term: Able to use daily as guideline for intensity in rehab;Long Term: Able to use THRR to govern intensity when exercising independently       Understanding of Exercise Prescription Yes       Intervention Provide education, explanation, and written materials on patient's individual exercise prescription       Expected Outcomes Short Term: Able to explain program exercise prescription;Long Term: Able to explain home exercise prescription to exercise independently          Exercise Goals Re-Evaluation :  Exercise Goals Re-Evaluation     Row Name 09/13/23 1500 12/21/23 0919 01/20/24 0736         Exercise Goal Re-Evaluation   Exercise Goals Review Increase Physical Activity;Increase Strength and Stamina;Able to understand and use rate of perceived exertion (RPE) scale;Knowledge and understanding of Target Heart Rate Range (THRR);Understanding of Exercise Prescription Increase Physical Activity;Increase Strength and Stamina;Able to understand and use rate of perceived exertion (RPE) scale;Knowledge and understanding of Target Heart Rate Range (THRR);Understanding of Exercise Prescription;Able to understand and use Dyspnea scale Increase Physical Activity;Increase Strength and Stamina;Able to understand and use rate of perceived exertion (RPE) scale;Knowledge and understanding of Target Heart Rate Range (THRR);Understanding of Exercise Prescription;Able to understand and use Dyspnea scale     Comments Reviewed METs and goals. Pt voices progress on her goals of increased strength, stamina and energy. Pt has goal of weight loss and has lost 5.6 kg to date. Pt has completed 2 exercise sessions in [place], missing 2 sessions due to  fatigue. She is exercising for 15 min on the track and bike. Lee averages 2.37 METs  on the track and 3.8 METs at level 3 on the bike. Jama performs the warmup and cooldown standing without limitations, using blue bands, 5.8 lbs. Will progress as tolerated. Pt has completed 10 exercise sessions in [place]. She is exercising for 15 min on the track and bike. Lee averages 2.5 METs on the track and 4.5 METs at level 3.5 on the bike. Jama performs the warmup and cooldown standing without limitations, using blue bands, 5.8 lbs, but moving to black next session. She is progressing well.  Will progress as tolerated.     Expected Outcomes Will continue to monitor patient and progress exercise workloads as tolerated. Will continue to monitor patient and progress exercise workloads as tolerated. Will continue to monitor patient and progress exercise workloads as tolerated.        Discharge Exercise Prescription (Final Exercise Prescription Changes):  Exercise Prescription Changes - 01/25/24 1500       Response to Exercise   Blood Pressure (Admit) 126/66    Blood Pressure (Exercise) 160/70    Blood Pressure (Exit) 102/62    Heart Rate (Admit) 97 bpm    Heart Rate (Exercise) 119 bpm    Heart Rate (Exit) 94 bpm    Oxygen Saturation (Admit) 91 %    Oxygen Saturation (Exercise) 91 %    Oxygen Saturation (Exit) 94 %    Rating of Perceived Exertion (Exercise) 13    Perceived Dyspnea (Exercise) 2    Duration Continue with 30 min of aerobic exercise without signs/symptoms of physical distress.    Intensity THRR unchanged      Progression   Progression Continue to progress workloads to maintain intensity without signs/symptoms of physical distress.      Resistance Training   Training Prescription Yes    Weight blue bands    Reps 10-15    Time 10 Minutes      Oxygen   Oxygen Continuous    Liters 1-2      Bike   Level 3.3    Minutes 15    METs 4.6      Track   Laps 12    Minutes 15    METs 2.5       Oxygen   Maintain Oxygen Saturation 88% or higher          Nutrition:  Target Goals: Understanding of nutrition guidelines, daily intake of sodium 1500mg , cholesterol 200mg , calories 30% from fat and 7% or less from saturated fats, daily to have 5 or more servings of fruits and vegetables.  Biometrics:  Pre Biometrics - 12/01/23 1442       Pre Biometrics   Grip Strength 21 kg          Post Biometrics - 11/01/23 1704        Post  Biometrics   Height 5' 8 (1.727 m)    Weight 89.7 kg    Waist Circumference 47 inches    Hip Circumference 51 inches    Waist to Hip Ratio 0.92 %    BMI (Calculated) 30.08    Triceps Skinfold 14 mm    % Body Fat 40.4 %    Grip Strength 20 kg    Flexibility 22 in    Single Leg Stand 18.57 seconds          Nutrition Therapy Plan and Nutrition Goals:  Nutrition Therapy & Goals - 01/11/24 1504       Nutrition Therapy   Diet Heart Healthy Diet    Drug/Food  Interactions Statins/Certain Fruits      Personal Nutrition Goals   Nutrition Goal Patient to improve diet quality by using the plate method as a guide for meal planning to include lean protein/plant protein, fruits, vegetables, whole grains, nonfat dairy as part of a well-balanced diet.   goal in progress.   Personal Goal #2 Patient to identify strategies for weight loss of 0.5-2.0# per week.   goal in progress.   Comments Goals in progress. Jama has medical history of hyperlipidemia, s/p TAVR, STEMI, CHF, HTN, CAD, carotid artery stenosis. She recently completed intensive cardiac rehab. She is now starting pulmonary rehab for DOE, hemoptysis. She is motivated to lose weight to 170#; she lost about 6.6# throughout cardiac rehab and has maintained her weight since starting pulmonary rehab. LDL has improved; she continues zetia , crestor  and heart healthy diet modifications. Patient will benefit from attendance to pulmonary rehab and  adherence to nutrition, exercise, and lifestyle  modification.      Intervention Plan   Intervention Prescribe, educate and counsel regarding individualized specific dietary modifications aiming towards targeted core components such as weight, hypertension, lipid management, diabetes, heart failure and other comorbidities.;Nutrition handout(s) given to patient.    Expected Outcomes Short Term Goal: Understand basic principles of dietary content, such as calories, fat, sodium, cholesterol and nutrients.;Long Term Goal: Adherence to prescribed nutrition plan.          Nutrition Assessments:  Nutrition Assessments - 12/21/23 1515       Rate Your Plate Scores   Pre Score 67         MEDIFICTS Score Key: >=70 Need to make dietary changes  40-70 Heart Healthy Diet <= 40 Therapeutic Level Cholesterol Diet  Flowsheet Row PULMONARY REHAB OTHER RESPIRATORY from 12/21/2023 in [facility]  Picture Your Plate Total Score on Admission 67   Picture Your Plate Scores: <59 Unhealthy dietary pattern with much room for improvement. 41-50 Dietary pattern unlikely to meet recommendations for good health and room for improvement. 51-60 More healthful dietary pattern, with some room for improvement.  >60 Healthy dietary pattern, although there may be some specific behaviors that could be improved.    Nutrition Goals Re-Evaluation:  Nutrition Goals Re-Evaluation     Row Name 08/24/23 1018 09/24/23 1040 10/27/23 0929 11/17/23 1536 12/07/23 1549     Goals   Current Weight 202 lb 6.1 oz (91.8 kg) 199 lb 1.2 oz (90.3 kg) 196 lb 3.4 oz (89 kg) 196 lb 3.4 oz (89 kg) 194 lb 10.7 oz (88.3 kg)   Comment LDL 94, Lpa 227 LDL 71, HDL WNL, Lpa 227 no new labs; most recent labs  LDL 71, HDL WNL, Lpa 227 most recent labs LDL 71, HDL WNL, Lpa 227 most recent labs LDL 71, HDL WNL, Lpa 227   Expected Outcome Goals in progress. Jama has medical history of hyperlipidemia, s/p TAVR, STEMI, CHF, HTN, CAD, carotid  artery stenosis. She continues to attend the [employer] education and nutrition series regularly. She is motivated to lose weight to 170#; however, she has not lost weight since starting with our program. LDL remains above goal; she continues zetia , crestor  and heart healthy diet modifications. Patient will benefit from participation in intensive cardiac rehab for nutrition, exercise, and lifestyle modification. Goals in progress. Jama has medical history of hyperlipidemia, s/p TAVR, STEMI, CHF, HTN, CAD, carotid artery stenosis. She continues to attend the [employer] education and nutrition series regularly. She is motivated  to lose weight to 170#; she is down 3.7# since starting with our program. LDL has improved to 71; she continues zetia , crestor  and heart healthy diet modifications. Patient will benefit from participation in intensive cardiac rehab for nutrition, exercise, and lifestyle modification.90.3kg Goals in progress. Jama has medical history of hyperlipidemia, s/p TAVR, STEMI, CHF, HTN, CAD, carotid artery stenosis. She continues to attend the [employer] education and nutrition series regularly. She is motivated to lose weight to 170#; she is down 6.6# since starting with our program. LDL has improved to 71; she continues zetia , crestor  and heart healthy diet modifications. She continues follow-up with pulmonolgy regarding hemoptysis. Patient will benefit from participation in intensive cardiac rehab for nutrition, exercise, and lifestyle modification. Goals in progress. Jama has medical history of hyperlipidemia, s/p TAVR, STEMI, CHF, HTN, CAD, carotid artery stenosis. She has attended the [employer] education and nutrition series regularly. She is motivated to lose weight to 170#; she is down 6.6# since starting with our program. LDL has improved to 71; she continues zetia , crestor  and heart healthy diet modifications. She continues follow-up with pulmonolgy regarding hemoptysis and plans to start pulmonary  rehab. Patient will benefit from adherence to nutrition, exercise, and lifestyle modification. Goals in progress. Jama has medical history of hyperlipidemia, s/p TAVR, STEMI, CHF, HTN, CAD, carotid artery stenosis. She recently completed intensive cardiac rehab. She is now starting pulmonary rehab for DOE, hemoptysis. She is motivated to lose weight to 170#; she lost about 6.6# throughout cardiac rehab. LDL has improved to 71; she continues zetia , crestor  and heart healthy diet modifications. Patient will benefit from attendance to pulmonary rehab and adherence to nutrition, exercise, and lifestyle modification.    Row Name 01/11/24 1504             Goals   Current Weight 194 lb 0.1 oz (88 kg)       Comment LDL 82, BNP 588       Expected Outcome Goals in progress. Jama has medical history of hyperlipidemia, s/p TAVR, STEMI, CHF, HTN, CAD, carotid artery stenosis. She recently completed intensive cardiac rehab. She is now starting pulmonary rehab for DOE, hemoptysis. She is motivated to lose weight to 170#; she lost about 6.6# throughout cardiac rehab and has maintained her weight since starting pulmonary rehab. LDL has improved; she continues zetia , crestor  and heart healthy diet modifications. Patient will benefit from attendance to pulmonary rehab and adherence to nutrition, exercise, and lifestyle modification.          Nutrition Goals Discharge (Final Nutrition Goals Re-Evaluation):  Nutrition Goals Re-Evaluation - 01/11/24 1504       Goals   Current Weight 194 lb 0.1 oz (88 kg)    Comment LDL 82, BNP 588    Expected Outcome Goals in progress. Jama has medical history of hyperlipidemia, s/p TAVR, STEMI, CHF, HTN, CAD, carotid artery stenosis. She recently completed intensive cardiac rehab. She is now starting pulmonary rehab for DOE, hemoptysis. She is motivated to lose weight to 170#; she lost about 6.6# throughout cardiac rehab and has maintained her weight since starting pulmonary rehab.  LDL has improved; she continues zetia , crestor  and heart healthy diet modifications. Patient will benefit from attendance to pulmonary rehab and adherence to nutrition, exercise, and lifestyle modification.          Psychosocial: Target Goals: Acknowledge presence or absence of significant depression and/or stress, maximize coping skills, provide positive support system. Participant is able to verbalize types and ability to use techniques and  skills needed for reducing stress and depression.  Initial Review & Psychosocial Screening:  Initial Psych Review & Screening - 12/01/23 1316       Initial Review   Current issues with Current Anxiety/Panic;Current Psychotropic Meds;Current Stress Concerns    Source of Stress Concerns Chronic Illness;Family    Comments Jama stated she is concerned about her health declining, has stress related to her family      Family Dynamics   Good Support System? Yes    Comments Pt has daughters as support      Barriers   Psychosocial barriers to participate in program The patient should benefit from training in stress management and relaxation.      Screening Interventions   Interventions Encouraged to exercise;Provide feedback about the scores to participant    Expected Outcomes Short Term goal: Utilizing psychosocial counselor, staff and physician to assist with identification of specific Stressors or current issues interfering with healing process. Setting desired goal for each stressor or current issue identified.;Long Term Goal: Stressors or current issues are controlled or eliminated.;Short Term goal: Identification and review with participant of any Quality of Life or Depression concerns found by scoring the questionnaire.;Long Term goal: The participant improves quality of Life and PHQ9 Scores as seen by post scores and/or verbalization of changes          Quality of Life Scores:  Quality of Life - 12/23/23 1541       Quality of Life   Select --    Pt returned paperwork 12/21/23     Quality of Life Scores   Health/Function Pre 20 %    Health/Function Post 24.86 %    Health/Function % Change 24.3 %    Socioeconomic Pre 26.57 %    Socioeconomic Post 29 %    Socioeconomic % Change  9.15 %    Psych/Spiritual Pre 18.66 %    Psych/Spiritual Post 25.71 %    Psych/Spiritual % Change 37.78 %    Family Pre 18 %    Family Post 25.5 %    Family % Change 41.67 %    GLOBAL Pre 21.1 %    GLOBAL Post 25.94 %    GLOBAL % Change 22.94 %         Scores of 19 and below usually indicate a poorer quality of life in these areas.  A difference of  2-3 points is a clinically meaningful difference.  A difference of 2-3 points in the total score of the Quality of Life Index has been associated with significant improvement in overall quality of life, self-image, physical symptoms, and general health in studies assessing change in quality of life.  PHQ-9: Review Flowsheet  More data exists      12/01/2023 11/17/2023 08/02/2023 07/26/2023 07/05/2023  Depression screen PHQ 2/9  Decreased Interest 0 0 0 1 0  Down, Depressed, Hopeless 0 0 0 0 0  PHQ - 2 Score 0 0 0 1 0  Altered sleeping 1 0 - 2 -  Tired, decreased energy 1 1 - 3 -  Change in appetite 1 0 - 1 -  Feeling bad or failure about yourself  0 0 - 0 -  Trouble concentrating 0 0 - 0 -  Moving slowly or fidgety/restless 0 0 - 0 -  Suicidal thoughts 0 0 - 0 -  PHQ-9 Score 3 1 - 7 -  Difficult doing work/chores Not difficult at all Not difficult at all - Not difficult at all -  Interpretation of Total Score  Total Score Depression Severity:  1-4 = Minimal depression, 5-9 = Mild depression, 10-14 = Moderate depression, 15-19 = Moderately severe depression, 20-27 = Severe depression   Psychosocial Evaluation and Intervention:  Psychosocial Evaluation - 12/01/23 1318       Psychosocial Evaluation & Interventions   Interventions Encouraged to exercise with the program and follow exercise  prescription    Comments Jama has an extensive mental health history. She has been under the care of a psychiatrist and pain management MD since she was in her 76s. She continues to see both doctors' regularly. Jama stated she is concerned about her health declining and has stress related to her family. She declines any additional needs, resources, or referrals currently.    Expected Outcomes For Jama to participate in [facility] free of any barriers or concerns    Continue Psychosocial Services  No Follow up required          Psychosocial Re-Evaluation:  Psychosocial Re-Evaluation     Row Name 08/11/23 [phone] 08/19/23 1534 09/21/23 0955 10/12/23 0905 11/09/23 1602     Psychosocial Re-Evaluation   Current issues with Current Stress Concerns;Current Anxiety/Panic Current Stress Concerns;Current Anxiety/Panic Current Stress Concerns;Current Anxiety/Panic Current Stress Concerns;Current Anxiety/Panic Current Stress Concerns;Current Anxiety/Panic   Comments Reviewed quality of life and PHq2-9.Jama says she is dissatisfied with her health due to her recent MI, Stenting and TAVR. Jama says that her adult children who live with her do not help as they should. Jama denies being depressed currently. Jama takes xanax  for anxiety. Jama says that her anxiety is controlled. Jama sees a psychiatrist once a year.  Offered emotional support and reassurance. Jama continues to voice having family stress with her live in children. Jama also reports having stress about her respitory status as she is now wearing oxygen during exercise. Emotional support provided. Jama continues to voice having family stress with her live in children. Emotional support provided Jama continues to voice having family stress with her live in children. Emotional support provided. Jama would benifit from couseling. Jama says she is not interested at this time. Jama continues to voice concerns about her  upcoming procedure on 11/17/23 due to hemoptysis.    Expected Outcomes Jama will have decreased or controlled anxiety/ stressors upon completion of cardiac rehab Jama will have decreased or controlled anxiety/ stressors upon completion of cardiac rehab Jama will have decreased or controlled anxiety/ stressors upon completion of cardiac rehab Jama will have decreased or controlled anxiety/ stressors upon completion of cardiac rehab Jama will have decreased or controlled anxiety/ stressors upon completion of cardiac rehab   Interventions Stress management education;Relaxation education;Encouraged to attend Cardiac Rehabilitation for the exercise Stress management education;Relaxation education;Encouraged to attend Cardiac Rehabilitation for the exercise Stress management education;Relaxation education;Encouraged to attend Cardiac Rehabilitation for the exercise Stress management education;Relaxation education;Encouraged to attend Cardiac Rehabilitation for the exercise Stress management education;Relaxation education;Encouraged to attend Cardiac Rehabilitation for the exercise   Continue Psychosocial Services  Follow up required by staff Follow up required by staff Follow up required by staff Follow up required by staff Follow up required by staff     Initial Review   Source of Stress Concerns Chronic Illness;Family Chronic Illness;Family Chronic Illness;Family Chronic Illness;Family Chronic Illness;Family   Comments Will continue to monitor and offer support as needed. Will continue to monitor and offer support as needed. Will continue to monitor and offer support as needed. Will continue to monitor and offer support as needed.  Will continue to monitor and offer support as needed.    Row Name 12/28/23 1229 01/19/24 1516           Psychosocial Re-Evaluation   Current issues with Current Stress Concerns;Current Psychotropic Meds;Current Anxiety/Panic;History of Depression;Current Depression Current Stress Concerns;Current Psychotropic Meds;Current  Anxiety/Panic;History of Depression;Current Depression      Comments Monthly psy/soc reevaluation: Jama has an extensive mental health history. She has been under the care of a psychiatrist and pain management MD since she was in her 15s. She continues to see both doctors' regularly. Jama stated she is concerned about her health declining and has stress related to her family. She declines any additional needs, resources, or referrals currently. Monthly psy/soc reevaluation: Jama has an extensive mental health history. She has been under the care of a psychiatrist and pain management MD since she was in her 24s. She continues to see both doctors' regularly. Jama was relieved to find out her bronchoscopy results were negative. She stated that, this is a weight lifted off of me. She declines any additional needs, resources, or referrals currently.      Expected Outcomes Jama will have decreased or controlled anxiety/stressors. Jama will to continue to exercise for stress relief and to have a positive outlook with good coping skills to manage her stress and any other psychosocial issues that may arise. Jama will have decreased or controlled anxiety/stressors. Jama will to continue to exercise for stress relief and to have a positive outlook with good coping skills to manage her stress and any other psychosocial issues that may arise.      Continue Psychosocial Services  No Follow up required No Follow up required        Initial Review   Source of Stress Concerns -- --      Comments -- --         Psychosocial Discharge (Final Psychosocial Re-Evaluation):  Psychosocial Re-Evaluation - 01/19/24 1516       Psychosocial Re-Evaluation   Current issues with Current Stress Concerns;Current Psychotropic Meds;Current Anxiety/Panic;History of Depression;Current Depression    Comments Monthly psy/soc reevaluation: Jama has an extensive mental health history. She has been under the care of a psychiatrist and pain  management MD since she was in her 20s. She continues to see both doctors' regularly. Jama was relieved to find out her bronchoscopy results were negative. She stated that, this is a weight lifted off of me. She declines any additional needs, resources, or referrals currently.    Expected Outcomes Jama will have decreased or controlled anxiety/stressors. Jama will to continue to exercise for stress relief and to have a positive outlook with good coping skills to manage her stress and any other psychosocial issues that may arise.    Continue Psychosocial Services  No Follow up required          Education: Education Goals: Education classes will be provided on a weekly basis, covering required topics. Participant will state understanding/return demonstration of topics presented.  Learning Barriers/Preferences:  Learning Barriers/Preferences - 12/01/23 1319       Learning Barriers/Preferences   Learning Barriers None    Learning Preferences Group Instruction;Individual Instruction;Verbal Instruction;Written Material          Education Topics: Know Your Numbers Group instruction that is supported by a PowerPoint presentation. Instructor discusses importance of knowing and understanding resting, exercise, and post-exercise oxygen saturation, heart rate, and blood pressure. Oxygen saturation, heart rate, blood pressure, rating of perceived exertion, and dyspnea are reviewed along  with a normal range for these values.    Exercise for the Pulmonary Patient Group instruction that is supported by a PowerPoint presentation. Instructor discusses benefits of exercise, core components of exercise, frequency, duration, and intensity of an exercise routine, importance of utilizing pulse oximetry during exercise, safety while exercising, and options of places to exercise outside of rehab.    MET Level  Group instruction provided by PowerPoint, verbal discussion, and written material to support subject  matter. Instructor reviews what METs are and how to increase METs.    Pulmonary Medications Verbally interactive group education provided by instructor with focus on inhaled medications and proper administration.   Anatomy and Physiology of the Respiratory System Group instruction provided by PowerPoint, verbal discussion, and written material to support subject matter. Instructor reviews respiratory cycle and anatomical components of the respiratory system and their functions. Instructor also reviews differences in obstructive and restrictive respiratory diseases with examples of each.    Oxygen Safety Group instruction provided by PowerPoint, verbal discussion, and written material to support subject matter. There is an overview of "What is Oxygen" and "Why do we need it".  Instructor also reviews how to create a safe environment for oxygen use, the importance of using oxygen as prescribed, and the risks of noncompliance. There is a brief discussion on traveling with oxygen and resources the patient may utilize.   Oxygen Use Group instruction provided by PowerPoint, verbal discussion, and written material to discuss how supplemental oxygen is prescribed and different types of oxygen supply systems. Resources for more information are provided.    Breathing Techniques Group instruction that is supported by demonstration and informational handouts. Instructor discusses the benefits of pursed lip and diaphragmatic breathing and detailed demonstration on how to perform both.  Flowsheet Row PULMONARY REHAB OTHER RESPIRATORY from 01/06/2024 in [facility]  Date 01/06/24  Educator RN  Instruction Review Code 1- Verbalizes Understanding     Risk Factor Reduction Group instruction that is supported by a PowerPoint presentation. Instructor discusses the definition of a risk factor, different risk factors for pulmonary disease, and how the  heart and lungs work together.   Pulmonary Diseases Group instruction provided by PowerPoint, verbal discussion, and written material to support subject matter. Instructor gives an overview of the different type of pulmonary diseases. There is also a discussion on risk factors and symptoms as well as ways to manage the diseases.   Stress and Energy Conservation Group instruction provided by PowerPoint, verbal discussion, and written material to support subject matter. Instructor gives an overview of stress and the impact it can have on the body. Instructor also reviews ways to reduce stress. There is also a discussion on energy conservation and ways to conserve energy throughout the day. Flowsheet Row PULMONARY REHAB OTHER RESPIRATORY from 01/13/2024 in [facility], Vascular, & Lung Health  Date 01/13/24  Educator RN  Instruction Review Code 1- Verbalizes Understanding    Warning Signs and Symptoms Group instruction provided by PowerPoint, verbal discussion, and written material to support subject matter. Instructor reviews warning signs and symptoms of stroke, heart attack, cold and flu. Instructor also reviews ways to prevent the spread of infection.   Other Education Group or individual verbal, written, or video instructions that support the educational goals of the pulmonary rehab program.    Knowledge Questionnaire Score:  Knowledge Questionnaire Score - 12/23/23 1537       Knowledge Questionnaire  Score   Post Score 21/24   Pt returned paperwork 12/21/23         Core Components/Risk Factors/Patient Goals at Admission:  Personal Goals and Risk Factors at Admission - 12/01/23 1344       Core Components/Risk Factors/Patient Goals on Admission    Weight Management Yes;Obesity;Weight Loss    Intervention Weight Management: Develop a combined nutrition and exercise program designed to reach desired caloric intake, while maintaining appropriate  intake of nutrient and fiber, sodium and fats, and appropriate energy expenditure required for the weight goal.;Weight Management: Provide education and appropriate resources to help participant work on and attain dietary goals.;Weight Management/Obesity: Establish reasonable short term and long term weight goals.;Obesity: Provide education and appropriate resources to help participant work on and attain dietary goals.    Admit Weight 194 lb 9.6 oz (88.3 kg)    Goal Weight: Short Term 184 lb (83.5 kg)    Goal Weight: Long Term 170 lb (77.1 kg)   pt goal   Expected Outcomes Short Term: Continue to assess and modify interventions until short term weight is achieved;Long Term: Adherence to nutrition and physical activity/exercise program aimed toward attainment of established weight goal;Weight Loss: Understanding of general recommendations for a balanced deficit meal plan, which promotes 1-2 lb weight loss per week and includes a negative energy balance of [phone] kcal/d;Understanding recommendations for meals to include 15-35% energy as protein, 25-35% energy from fat, 35-60% energy from carbohydrates, less than 200mg  of dietary cholesterol, 20-35 gm of total fiber daily;Understanding of distribution of calorie intake throughout the day with the consumption of 4-5 meals/snacks    Tobacco Cessation Yes    Number of packs per day pt states smokes occassionally with dgt    Intervention Assist the participant in steps to quit. Provide individualized education and counseling about committing to Tobacco Cessation, relapse prevention, and pharmacological support that can be provided by physician.;Education officer, environmental, assist with locating and accessing local/national Quit Smoking programs, and support quit date choice.    Expected Outcomes Short Term: Will demonstrate readiness to quit, by selecting a quit date.;Long Term: Complete abstinence from all tobacco products for at least 12 months from quit  date.;Short Term: Will quit all tobacco product use, adhering to prevention of relapse plan.    Improve shortness of breath with ADL's Yes    Intervention Provide education, individualized exercise plan and daily activity instruction to help decrease symptoms of SOB with activities of daily living.    Expected Outcomes Short Term: Improve cardiorespiratory fitness to achieve a reduction of symptoms when performing ADLs;Long Term: Be able to perform more ADLs without symptoms or delay the onset of symptoms    Stress Yes    Intervention Offer individual and/or small group education and counseling on adjustment to heart disease, stress management and health-related lifestyle change. Teach and support self-help strategies.;Refer participants experiencing significant psychosocial distress to appropriate mental health specialists for further evaluation and treatment. When possible, include family members and significant others in education/counseling sessions.    Expected Outcomes Short Term: Participant demonstrates changes in health-related behavior, relaxation and other stress management skills, ability to obtain effective social support, and compliance with psychotropic medications if prescribed.;Long Term: Emotional wellbeing is indicated by absence of clinically significant psychosocial distress or social isolation.          Core Components/Risk Factors/Patient Goals Review:   Goals and Risk Factor Review     Row Name 08/11/23 [phone] 08/19/23 1537 09/21/23 0958 10/12/23 0908 11/09/23  1605     Core Components/Risk Factors/Patient Goals Review   Personal Goals Review Weight Management/Obesity;Heart Failure;Lipids;Hypertension;Stress;Improve shortness of breath with ADL's Weight Management/Obesity;Heart Failure;Lipids;Hypertension;Stress;Improve shortness of breath with ADL's Weight Management/Obesity;Heart Failure;Lipids;Hypertension;Stress;Improve shortness of breath with ADL's Weight  Management/Obesity;Heart Failure;Lipids;Hypertension;Stress;Improve shortness of breath with ADL's Weight Management/Obesity;Heart Failure;Lipids;Hypertension;Stress;Improve shortness of breath with ADL's   Review Briella started cardiac rehab on 07/28/23. Savhanna is off to a good start to exercise. oxygen saturatiobns have been consistantly dropping into the mid 80's. Order obtained to use oxygen with exercise. Patient agreeable.  Will use oxygen on next scheduled visit. Jamieka is enjoying participating in cardiac rehab so far. Takeira is doing well with exercise at  cardiac rehab. Vital signs and oxygen saturations have been stable now that Jama is wearing oxygen with exercise. oxygen saturations have been in the low to mid 90's on 2l/min. Kirti has an upcoming appointment with her pulmonolgist in March. Monya is doing well with exercise at  cardiac rehab. Vital signs and oxygen saturations have been stable now that Jama is wearing oxygen with exercise. Oxygen saturations have remained in the low to mid 90's on 2l/min. Jama would benefit from participating in pulmonary rehab when she completes cardiac rehab. Jama says she is more interested in water aerobics. Jama has lost 1.7 kg since starting cardiac rehab. Addaleigh is doing well with exercise at  cardiac rehab. Vital signs have been stable.  Oxygen saturations have remained in the upper 80's to  mid 90's on 2l/min. Jama would benefit from participating in pulmonary rehab when she completes cardiac rehab. Jama says she is more interested in water aerobics. Jama has lost 2.0 kg since starting cardiac rehab. Will notify Dr Elmira about oxygen saturations. Jammi is doing well with exercise at  cardiac rehab. Vital signs have been stable.  Oxygen saturations have remained in the upper 80's to  mid 90's on 2l/min. Jama would benefit from participating in pulmonary rehab when she completes cardiac rehab. Jama has lost 4 kg since starting cardiac rehab. Jama will  complete cardiac rehab on 11/17/23   Expected Outcomes Roschelle will continue to participate in cardiac rehab for exercise, nutrition and lifestyle modifications Isadore will continue to participate in cardiac rehab for exercise, nutrition and lifestyle modifications Daveah will continue to participate in cardiac rehab for exercise, nutrition and lifestyle modifications Anetria will continue to participate in cardiac rehab for exercise, nutrition and lifestyle modifications Jackquline will continue to participate in cardiac rehab for exercise, nutrition and lifestyle modifications    Row Name 12/28/23 1232 01/19/24 1518           Core Components/Risk Factors/Patient Goals Review   Personal Goals Review Weight Management/Obesity;Stress;Improve shortness of breath with ADL's;Develop more efficient breathing techniques such as purse lipped breathing and diaphragmatic breathing and practicing self-pacing with activity. Weight Management/Obesity;Stress;Improve shortness of breath with ADL's;Develop more efficient breathing techniques such as purse lipped breathing and diaphragmatic breathing and practicing self-pacing with activity.      Review Monthly review of patient's Core Components/Risk Factors/Patient Goals are as follows: Goal in progress for improving her shortness of breath with ADLs. Jama has been stable on 2L [place]. She has completed 3 sessions so far. Goal progressing on developing more efficient breathing techniques such as purse lipped breathing and diaphragmatic breathing; and practicing self-pacing with activity. She needs to be prompted to initiate pursed lip breathing. She has begun practicing diaphragmatic breathing before bed. She can self-pace herself when needed and allows herself to take breaks while  walking or exercising. Goal not met for smoking cessation. Jama has not set a plan. She doesn't have a desire to quit smoking yet. She has been provided resources. Goal progressing for weight loss.  Jama is currently down #1.3# since starting the program. She is working with our dietitian to decrease calories. Jama will continue to benefit from PR for nutrition, education, exercise, and lifestyle modification. Monthly review of patient's Core Components/Risk Factors/Patient Goals are as follows: Goal in progress for improving her shortness of breath with ADLs. Jama has been stable on 2L [place] while walking the track. Her oxygen needs have decreased on the bike, and she has been on room air. Goal met on developing more efficient breathing techniques such as purse lipped breathing and diaphragmatic breathing; and practicing self-pacing with activity. Jama can initiate pursed lip breathing on her own. She practices diaphragmatic breathing before bed. She can self-pace herself when needed and allows herself to take breaks while walking or exercising. Goal not met for smoking cessation. Jama has not set a plan. She doesn't have a desire to quit smoking yet. She states she has cut back. She has been provided resources. Goal progressing for weight loss. Jama is currently down ~1# since starting the program. She is working with our dietitian to decrease calories and make healthy choices. Goal met for decreased stress. Lee's health is better than it was when she started the program and her tests and lab results have come back negative. Jama will continue to benefit from PR for nutrition, education, exercise, and lifestyle modification.      Expected Outcomes Pt will show progress toward meeting expected goals and outcomes. Pt will show progress toward meeting expected goals and outcomes.         Core Components/Risk Factors/Patient Goals at Discharge (Final Review):   Goals and Risk Factor Review - 01/19/24 1518       Core Components/Risk Factors/Patient Goals Review   Personal Goals Review Weight Management/Obesity;Stress;Improve shortness of breath with ADL's;Develop more efficient breathing techniques such as purse  lipped breathing and diaphragmatic breathing and practicing self-pacing with activity.    Review Monthly review of patient's Core Components/Risk Factors/Patient Goals are as follows: Goal in progress for improving her shortness of breath with ADLs. Jama has been stable on 2L [place] while walking the track. Her oxygen needs have decreased on the bike, and she has been on room air. Goal met on developing more efficient breathing techniques such as purse lipped breathing and diaphragmatic breathing; and practicing self-pacing with activity. Jama can initiate pursed lip breathing on her own. She practices diaphragmatic breathing before bed. She can self-pace herself when needed and allows herself to take breaks while walking or exercising. Goal not met for smoking cessation. Jama has not set a plan. She doesn't have a desire to quit smoking yet. She states she has cut back. She has been provided resources. Goal progressing for weight loss. Jama is currently down ~1# since starting the program. She is working with our dietitian to decrease calories and make healthy choices. Goal met for decreased stress. Lee's health is better than it was when she started the program and her tests and lab results have come back negative. Jama will continue to benefit from PR for nutrition, education, exercise, and lifestyle modification.    Expected Outcomes Pt will show progress toward meeting expected goals and outcomes.          ITP Comments:  ITP Comments     Row Name  08/19/23 1533 09/21/23 0954 10/12/23 0904 11/09/23 1601     ITP Comments 30 Day ITP Review. Jama has good attendance and participation with exercise at cardiac rehab. Jama is off to a good start to exercise for her fitness level. 30 Day ITP Review. Jama continues to have good attendance and participation with exercise at cardiac rehab. 30 Day ITP Review. Jama continues to have good attendance and participation with exercise at cardiac rehab. Jama will complete cardiac  rehab in the begining of May. 30 Day ITP Review. Jama continues to have good attendance and participation with exercise at cardiac rehab. Jama will complete cardiac rehab on May 28 after her bronchoscopy is completed       Comments: Pt is making expected progress toward Pulmonary Rehab goals after completing 11 session(s). Recommend continued exercise, life style modification, education, and utilization of breathing techniques to increase stamina and strength, while also decreasing shortness of breath with exertion.  Dr. Slater Staff is Medical Director for Pulmonary Rehab at [facility].

## 2024-01-26 NOTE — Telephone Encounter (Signed)
 I contacted the patient received the order in 11/12/23 from [facility]. Hospital sent in an order for the patient and we sent an order to adapt for the patient's order to the hospital as well. I'll be giving [facility] a call and send the order over to them.

## 2024-01-26 NOTE — Telephone Encounter (Signed)
 [place], [address], [place]; [place], [place]; [place], [place]; [place], [place] [facility] will be able to provide patient with smaller tanks. Please provide order for OCD @ desired setting for patient to be on.   Let our office know when order has been added into pt chart.

## 2024-01-27 ENCOUNTER — Encounter (HOSPITAL_COMMUNITY)
Admission: RE | Admit: 2024-01-27 | Discharge: 2024-01-27 | Disposition: A | Discharge status: Home / Self Care | Source: Ambulatory Visit | Attending: Pulmonary Disease | Admitting: Pulmonary Disease

## 2024-01-27 DIAGNOSIS — R0609 Other forms of dyspnea: Secondary | ICD-10-CM | POA: Diagnosis not present

## 2024-01-27 NOTE — Progress Notes (Signed)
 Daily Session Note  Patient Details  Name: Madison West MRN: [id number] Date of Birth: August 01, 1955 Referring Provider:   Conrad Ports Pulmonary Rehab Walk Test from 12/01/2023 in [facility] for Heart, Vascular, & Lung Health  Referring Provider Mannam    Encounter Date: 01/27/2024  Check In:  Session Check In - 01/27/24 1456       Check-In   Supervising physician immediately available to respond to emergencies [facility] MD immediately available    Physician(s) Orren Fabry, PA    Location [facility]    Staff Present Ronal Levin, RN, Maud Moats, MS, ACSM-CEP, Exercise Physiologist;Zyria Fiscus Claudene, RT;Randi Midge BS, ACSM-CEP, Exercise Physiologist;Bailey Elnor, MS, Exercise Physiologist    Virtual Visit No    Medication changes reported     No    Fall or balance concerns reported    No    Tobacco Cessation No Change    Warm-up and Cool-down Performed as group-led instruction    Resistance Training Performed Yes    VAD Patient? No    PAD/SET Patient? No      Pain Assessment   Currently in Pain? No/denies    Pain Score 0-No pain    Multiple Pain Sites No          Capillary Blood Glucose: No results found for this or any previous visit (from the past 24 hours).    Social History   Tobacco Use  Smoking Status Former   Current packs/day: 0.00   Average packs/day: 0.8 packs/day for 20.0 years (15.0 ttl pk-yrs)   Types: Cigarettes   Start date: 05/06/1977   Quit date: 05/06/1997   Years since quitting: 26.7  Smokeless Tobacco Never    Goals Met:  Proper associated with RPD/PD & O2 Sat Independence with exercise equipment Exercise tolerated well No report of concerns or symptoms today Strength training completed today  Goals Unmet:  Not Applicable  Comments: Service time is from 1320 to 1421.    Dr. Slater Staff is Medical Director for Pulmonary Rehab at [facility].

## 2024-02-01 ENCOUNTER — Encounter (HOSPITAL_COMMUNITY)
Admission: RE | Admit: 2024-02-01 | Discharge: 2024-02-01 | Disposition: A | Discharge status: Home / Self Care | Source: Ambulatory Visit | Attending: Pulmonary Disease

## 2024-02-01 DIAGNOSIS — R0609 Other forms of dyspnea: Secondary | ICD-10-CM | POA: Diagnosis not present

## 2024-02-01 NOTE — Progress Notes (Signed)
 Daily Session Note  Patient Details  Name: Madison West MRN: [id number] Date of Birth: 10/07/1955 Referring Provider:   Conrad Ports Pulmonary Rehab Walk Test from 12/01/2023 in [facility] Heart, Vascular, & Lung Health  Referring Provider Mannam    Encounter Date: 02/01/2024  Check In:  Session Check In - 02/01/24 1455       Check-In   Supervising physician immediately available to respond to emergencies [facility] MD immediately available    Physician(s) Rosabel Mose, NP    Location [facility]    Staff Present Johnnie Moats, MS, ACSM-CEP, Exercise Physiologist;Casey Claudene, Maya Koyanagi, RN, BSN;Theadora Byes, RN, MHA;Olinty Valere, MS, ACSM-CEP, Exercise Physiologist    Virtual Visit No    Medication changes reported     No    Fall or balance concerns reported    No    Tobacco Cessation No Change    Warm-up and Cool-down Performed as group-led instruction    Resistance Training Performed Yes    VAD Patient? No    PAD/SET Patient? No      Pain Assessment   Currently in Pain? No/denies    Pain Score 0-No pain    Multiple Pain Sites No          Capillary Blood Glucose: No results found for this or any previous visit (from the past 24 hours).    Social History   Tobacco Use  Smoking Status Former   Current packs/day: 0.00   Average packs/day: 0.8 packs/day for 20.0 years (15.0 ttl pk-yrs)   Types: Cigarettes   Start date: 05/06/1977   Quit date: 05/06/1997   Years since quitting: 26.7  Smokeless Tobacco Never    Goals Met:  Proper associated with RPD/PD & O2 Sat Independence with exercise equipment Exercise tolerated well No report of concerns or symptoms today Strength training completed today  Goals Unmet:  Not Applicable  Comments: Service time is from 1318 to 1419.    Dr. Slater Staff is Medical Director for Pulmonary Rehab at [facility].

## 2024-02-02 ENCOUNTER — Other Ambulatory Visit (HOSPITAL_COMMUNITY): Payer: Medicare Other

## 2024-02-02 ENCOUNTER — Ambulatory Visit: Payer: Medicare Other

## 2024-02-03 ENCOUNTER — Encounter (HOSPITAL_COMMUNITY)
Admission: RE | Admit: 2024-02-03 | Discharge: 2024-02-03 | Disposition: A | Discharge status: Home / Self Care | Source: Ambulatory Visit | Attending: Pulmonary Disease | Admitting: Pulmonary Disease

## 2024-02-03 DIAGNOSIS — R0609 Other forms of dyspnea: Secondary | ICD-10-CM | POA: Diagnosis not present

## 2024-02-03 NOTE — Progress Notes (Signed)
 Daily Session Note  Patient Details  Name: Madison West MRN: [id number] Date of Birth: [birth year] Referring Provider:   Conrad Ports Pulmonary Rehab Walk Test from 12/01/2023 in [facility] for Heart, Vascular, & Lung Health  Referring Provider Mannam    Encounter Date: 02/03/2024  Check In:  Session Check In - 02/03/24 1500       Check-In   Supervising physician immediately available to respond to emergencies [facility] MD immediately available    Physician(s) Damien Braver, NP    Location [facility]    Staff Present Johnnie Moats, MS, ACSM-CEP, Exercise Physiologist;Casey Claudene Candia Levin, RN, BSN;Johnny Porter, MS, Exercise Physiologist    Virtual Visit No    Medication changes reported     No    Fall or balance concerns reported    No    Tobacco Cessation No Change    Warm-up and Cool-down Performed as group-led instruction    Resistance Training Performed Yes    VAD Patient? No    PAD/SET Patient? No      Pain Assessment   Currently in Pain? No/denies    Pain Score 0-No pain    Multiple Pain Sites No          Capillary Blood Glucose: No results found for this or any previous visit (from the past 24 hours).    Social History   Tobacco Use  Smoking Status Former   Current packs/day: 0.00   Average packs/day: 0.8 packs/day for 20.0 years (15.0 ttl pk-yrs)   Types: Cigarettes   Start date: 05/06/1977   Quit date: 05/06/1997   Years since quitting: 26.7  Smokeless Tobacco Never    Goals Met:  Independence with exercise equipment Exercise tolerated well Queuing for purse lip breathing No report of concerns or symptoms today Strength training completed today  Goals Unmet:  Not Applicable  Comments: Service time is from 1318 to 1415   Dr. Slater Staff is Medical Director for Pulmonary Rehab at [facility].

## 2024-02-03 NOTE — Telephone Encounter (Signed)
 Dr. Theophilus, please clarify liter flow. Thanks

## 2024-02-04 ENCOUNTER — Other Ambulatory Visit

## 2024-02-04 ENCOUNTER — Ambulatory Visit
Admission: RE | Admit: 2024-02-04 | Discharge: 2024-02-04 | Disposition: A | Discharge status: Home / Self Care | Source: Ambulatory Visit | Attending: Acute Care | Admitting: Acute Care

## 2024-02-04 DIAGNOSIS — R042 Hemoptysis: Secondary | ICD-10-CM

## 2024-02-04 NOTE — Addendum Note (Signed)
 Addended by: Audiel Scheiber M on: 02/04/2024 09:58 AM   Modules accepted: Orders

## 2024-02-04 NOTE — Telephone Encounter (Signed)
 According to the last order, it should be 3lpm o2  I placed the order  Routing back to [facility] to make them aware that it's in

## 2024-02-04 NOTE — Telephone Encounter (Signed)
 Sent [facility] a message to let them know the order has been updated. NFN

## 2024-02-04 NOTE — Telephone Encounter (Signed)
 I do not recall the flow on the oxygen.  It is probably 3 L/min Can you please see what is originally ordered and use that.  Thank you

## 2024-02-07 ENCOUNTER — Telehealth: Payer: Self-pay

## 2024-02-07 NOTE — Telephone Encounter (Signed)
 This was already taken care of.

## 2024-02-07 NOTE — Telephone Encounter (Signed)
 I called [facility] for confirmation on the order and what they needed. Patient's POC and OCD has been sent out for delivery. There was nothing else we needed to do. NFN>

## 2024-02-07 NOTE — Telephone Encounter (Signed)
 Per Kimber:  [place], [place], [place] ALECK [place]; [place], [place]; [place], [place]; [place], [place]-  I see the order in there for oxygen- OCD it looks like- we have notes in our system she is wanting a POC-  Also- we have to have a specific setting- ei- setting of 2  It cant say 3lpm or 2lpm-  only setting of __________  Please let us  know how you want to proceed- If Dr wants to correct rx  and we can pull- please call me if this doesnt make sense.

## 2024-02-07 NOTE — Telephone Encounter (Addendum)
 [place], [place] Fail, [address]; [place], [place]; Rumalda Raring; Liddie Mar; [place], [place] Rec'd message from [place] about this pt as well an let her know what was needed-  This is for an OCD and pt is wanting a POC-    the setting cannot be 3lpm-  it has to specify like setting of 2 or setting of 3- cannot have lpm after it.  Hope this makes sense

## 2024-02-08 ENCOUNTER — Encounter (HOSPITAL_COMMUNITY)
Admission: RE | Admit: 2024-02-08 | Discharge: 2024-02-08 | Disposition: A | Discharge status: Home / Self Care | Source: Ambulatory Visit | Attending: Pulmonary Disease

## 2024-02-08 VITALS — Wt 194.4 lb

## 2024-02-08 DIAGNOSIS — R0609 Other forms of dyspnea: Secondary | ICD-10-CM | POA: Diagnosis not present

## 2024-02-08 NOTE — Progress Notes (Signed)
 Daily Session Note  Patient Details  Name: Madison West MRN: [id number] Date of Birth: 1956-06-08 Referring Provider:   Conrad Ports Pulmonary Rehab Walk Test from 12/01/2023 in [facility]  Referring Provider Mannam    Encounter Date: 02/08/2024  Check In:  Session Check In - 02/08/24 1436       Check-In   Supervising physician immediately available to respond to emergencies [facility] MD immediately available    Physician(s) Lum Louis, NP    Location [facility]    Staff Present Johnnie Moats, MS, ACSM-CEP, Exercise Physiologist;Darlina Mccaughey Claudene Candia Levin, RN, Valere Music, RN, BSN    Virtual Visit No    Medication changes reported     No    Fall or balance concerns reported    No    Tobacco Cessation No Change    Warm-up and Cool-down Performed as group-led instruction    Resistance Training Performed Yes    VAD Patient? No    PAD/SET Patient? No      Pain Assessment   Currently in Pain? No/denies          Capillary Blood Glucose: No results found for this or any previous visit (from the past 24 hours).   Exercise Prescription Changes - 02/08/24 1400       Response to Exercise   Blood Pressure (Admit) 112/62    Blood Pressure (Exercise) 142/70    Blood Pressure (Exit) 96/60    Heart Rate (Admit) 95 bpm    Heart Rate (Exercise) 119 bpm    Heart Rate (Exit) 96 bpm    Oxygen Saturation (Admit) 93 %    Oxygen Saturation (Exercise) 95 %    Oxygen Saturation (Exit) 95 %    Rating of Perceived Exertion (Exercise) 11    Perceived Dyspnea (Exercise) 1    Duration Continue with 30 min of aerobic exercise without signs/symptoms of physical distress.    Intensity THRR unchanged      Progression   Progression Continue to progress workloads to maintain intensity without signs/symptoms of physical distress.      Resistance Training   Training Prescription Yes    Weight blue bands     Reps 10-15    Time 10 Minutes      Interval Training   Interval Training No      Oxygen   Oxygen Continuous    Liters 2      Bike   Level 3.5    Minutes 15    METs 5.1      Track   Laps 16    Minutes 15    METs 3      Oxygen   Maintain Oxygen Saturation 88% or higher          Social History   Tobacco Use  Smoking Status Former   Current packs/day: 0.00   Average packs/day: 0.8 packs/day for 20.0 years (15.0 ttl pk-yrs)   Types: Cigarettes   Start date: 05/06/1977   Quit date: 05/06/1997   Years since quitting: 26.7  Smokeless Tobacco Never    Goals Met:  Proper associated with RPD/PD & O2 Sat Independence with exercise equipment Exercise tolerated well No report of concerns or symptoms today Strength training completed today  Goals Unmet:  Not Applicable  Comments: Service time is from 1329 to 1427.    Dr. Slater Staff is Medical Director for Pulmonary Rehab at [facility].

## 2024-02-10 ENCOUNTER — Encounter (HOSPITAL_COMMUNITY)
Admission: RE | Admit: 2024-02-10 | Discharge: 2024-02-10 | Disposition: A | Discharge status: Home / Self Care | Source: Ambulatory Visit | Attending: Pulmonary Disease | Admitting: Pulmonary Disease

## 2024-02-10 DIAGNOSIS — R0609 Other forms of dyspnea: Secondary | ICD-10-CM | POA: Diagnosis not present

## 2024-02-10 NOTE — Progress Notes (Signed)
 Daily Session Note  Patient Details  Name: Makenna Macaluso MRN: [id number] Date of Birth: 1956-05-30 Referring Provider:   Conrad Ports Pulmonary Rehab Walk Test from 12/01/2023 in [facility]  Referring Provider Mannam    Encounter Date: 02/10/2024  Check In:  Session Check In - 02/10/24 1410       Check-In   Supervising physician immediately available to respond to emergencies [facility] immediately available    Physician(s) Rosaline Bane, NP    [facility]    Staff Present Johnnie Moats, MS, ACSM-CEP, Exercise Physiologist;Casey Claudene Candia Levin, RN, BSN;Randi Reeve BS, ACSM-CEP, Exercise Physiologist    Virtual Visit No    Medication changes reported     No    Fall or balance concerns reported    No    Tobacco Cessation No Change    Warm-up and Cool-down Performed as group-led instruction    Resistance Training Performed Yes    VAD Patient? No    PAD/SET Patient? No      Pain Assessment   Currently in Pain? No/denies    Pain Score 0-No pain    Multiple Pain Sites No          Capillary Blood Glucose: No results found for this or any previous visit (from the past 24 hours).    Social History   Tobacco Use  Smoking Status Former   Current packs/day: 0.00   Average packs/day: 0.8 packs/day for 20.0 years (15.0 ttl pk-yrs)   Types: Cigarettes   Start date: 05/06/1977   Quit date: 05/06/1997   Years since quitting: 26.7  Smokeless Tobacco Never    Goals Met:  Independence with exercise equipment Exercise tolerated well No report of concerns or symptoms today Strength training completed today  Goals Unmet:  Not Applicable  Comments: Service time is from 1316 to 1429    Dr. Slater Staff is Medical Director for Pulmonary Rehab at [facility].

## 2024-02-13 NOTE — Progress Notes (Unsigned)
 HEART AND VASCULAR CENTER   MULTIDISCIPLINARY HEART VALVE CLINIC                                     Cardiology Office Note:    Date:  02/14/2024   ID:  Eniola Cerullo, DOB 21-Oct-1955, MRN [id number]  PCP:  Teresa Aldona CROME, NP  [facility] Cardiologist:  [name], MD  [facility] Structural heart: Lurena MARLA Red, MD [facility] Electrophysiologist:  None   Referring MD: Teresa Aldona CROME, NP   1 year s/p TAVR  History of Present Illness:    Madison West is a 68 y.o. female with a hx of CAD s/p PCI to the RCA 02/2020, SVT, breast cancer s/p chemo/XRT in 2009, HTN, HLD, carotid artery disease s/p R CEA 2020, obesity, former smoker, extreme anxiety with multiple medication intolerances/phobias, chronic HFrEF, VT arrest, cardiogenic shock and severe LFLG aortic stenosis s/p TAVR (02/04/23) who presents to clinic for follow up.    She was admitted for a complex admission from 8/7-9/13/24. Presented with acute CHF. Echo showed new LV dysfunction with an EF 30-35% and severe LFLG AS with a mean grad 20 mmHg, peak grad 33.4 mmHg, AVA 0.93 cm2, DVI 0.33, SVI 25. She was diuresed with IV lasix . L/RHC 01/27/23 showed 95% mid LAD stenosis with mild calcification and moderate tortuosity (New since 2021), 40% downstream lesion in mid LAD (New since 2021), mid diffuse 50% Lcx disease (previously deemed 80% in 2021), patent ramus stent and mid eccentric 40% RCA disease. She was seen by Dr. Maryjane with CT surgery who felt she was too high risk for surgery and though PCI + TAVR was a better option. She underwent successful PCI/DES to LAD on 01/29/23 and started on aspirin  and Plavix . She was started on metoprolol  with plans to discharge home the following day. However, on 08/11 she developed pulseless VT requiring CPR and defibrillation and amio gtt. She had a STEMI with acute LAD stent thrombosis treated with aspiration thrombectomy and IVUS guided balloon dilatation. Plavix  switched to  Brillinta. Repeat echo showed EF down to 20-25%. She then developed recurrent episodes of pulseless VT on 01/31/22 for which she required multiple shocks and mechanical ventilation. Taken back to the cath lab. LAD stent patent. D/t recurrent VT with hemodynamic instability + cardiogenic shock, IABP was placed and required multiple vasopressors. ECMO considered but then the pt stabilized and underwent TAVR with a 26 mm Edwards S3UR via the left TF approach on 02/04/23. Echo 02/05/23 with EF 35-40%, mild LVH, WMAs, RV normal, bioprosthetic aortic valve s/p TAVR with mean gradient 13 mmH, trivial PVL, IVC mildly dilated. She later developed a left internal jugular DVT and started on Eliquis . Stabilized and discharged to inpatient rehab with discharge home on 03/05/23. Follow up she was doing remarkably well. Echo 03/18/23 showed  EF 40-45%, normally functioning TAVR with a mean gradient of 6 mm hg and no PVL as well as moderate MAC and mild MR. Later she came off amiodarone  and Eliqius.   Underwent exercise stress MPI on 01/03/24. Exercise capacity was moderately impaired. Patient exercised for 5 min. Maximum HR of 130 bpm. MPHR 86.0%. Peak METS 7.0. The patient experienced non-limiting angina during the test. The patient reported dyspnea and chest pain during the stress test. Normal blood pressure and normal heart rate response noted during stress. Heart rate recovery was normal. The ECG was  negative for ischemia. LV perfusion abnormal but consistent with scar per Dr. Elmira. EF 37% and severe CAC. She has been seen by pulm for evaluation of hemoptysis and underwent bronchoscopy. There was no malignancy detected. He was recommended a follow-up CT scan in 3 months. She was recommended nighttime oxygen use.   Today the patient presents to clinic for follow up. Here alone. Doing great. She is working with pulm rehab also back bartending. No CP or SOB. No LE edema, orthopnea or PND. No dizziness or syncope. No blood in  stool or urine. No palpitations.  Has a broken and infected tooth that bothers her but she is scared of the dentist. Doesn't;t like taking medications that aren't well studied.     Past Medical History:  Diagnosis Date   Anxiety    Aortic stenosis    s/p TAVR 02/04/23   Arthritis    low back and hip pain intermittent   Breast cancer (HCC) 06/02/2007   r breast -surgery ,radiaology. chemotherapy   Carotid artery disease (HCC)    Carpal tunnel syndrome    right hand   CHF (congestive heart failure) (HCC)    Colon polyps    Complication of anesthesia    Fentanyl , Versed -makes extra hyper, bradycardia x 1 in PACU, [facility] (08/15/11 cardiology felt neostigmine may have resulted in AV nodal block)    Coronary artery disease    Depression    denies   DVT (deep venous thrombosis) (HCC)    provoked left IJ 02/10/23   Dysplasia of vulva    Hypertension    Palpitations    PSVT, s/p adenosine  08/04/16   S/P breast lumpectomy 07/04/2007   R breast   S/P radiation therapy 2009   Vulva cancer (HCC)      Current Medications: Current Meds  Medication Sig   acetaminophen  (TYLENOL ) 325 MG tablet Take 2 tablets (650 mg total) by mouth every 6 (six) hours.   alprazolam  (XANAX ) 2 MG tablet Take 0.25-2 mg by mouth See admin instructions. 1 mg (may take up to 2 mg) twice daily. May also take 0.25 mg during the day if needed for anxiety.   amoxicillin  (AMOXIL ) 500 MG tablet Take 4 tablets (2,000 mg total) by mouth as directed. 1 HOUR PRIOR TO DENTAL PROCEDURES, INCLUDING CLEANINGS   aspirin  EC 81 MG tablet Take 1 tablet (81 mg total) by mouth daily. Swallow whole.   busPIRone  (BUSPAR ) 10 MG tablet TAKE 1 TABLET BY MOUTH TWICE A DAY (Patient taking differently: Take 10 mg by mouth 2 (two) times daily. Pt sometimes only takes once a day)   clopidogrel  (PLAVIX ) 75 MG tablet Take 1 tablet (75 mg total) by mouth daily.   dapagliflozin  propanediol (FARXIGA ) 10 MG TABS tablet Take 1 tablet (10 mg total) by  mouth daily before breakfast.   furosemide  (LASIX ) 20 MG tablet Take 0.5 tablets (10 mg total) by mouth daily as needed (for lower extremity swelling and weight gain).   losartan  (COZAAR ) 25 MG tablet Take 1 tablet (25 mg total) by mouth daily.   methocarbamol  (ROBAXIN ) 500 MG tablet Take 1 tablet (500 mg total) by mouth 2 (two) times daily. (Patient taking differently: Take 500 mg by mouth 2 (two) times daily. Only has in case she needs it)   nitroGLYCERIN  (NITROSTAT ) 0.4 MG SL tablet Place 1 tablet (0.4 mg total) under the tongue every 5 (five) minutes as needed for chest pain.   oxyCODONE  (ROXICODONE ) 5 MG immediate release tablet Take 1 tablet (  5 mg total) by mouth daily as needed for severe pain (pain score 7-10).   rosuvastatin  (CRESTOR ) 10 MG tablet Take 1 tablet (10 mg total) by mouth daily.   spironolactone  (ALDACTONE ) 25 MG tablet Take 1 tablet (25 mg total) by mouth daily.   [DISCONTINUED] amoxicillin  (AMOXIL ) 500 MG tablet Take 4 tablets (2,000 mg total) by mouth as directed. 1 HOUR PRIOR TO DENTAL PROCEDURES, INCLUDING CLEANINGS      ROS:   Please see the history of present illness.    All other systems reviewed and are negative.  EKGs       Risk Assessment/Calculations:           Physical Exam:    VS:  BP 134/76   Pulse 82   Ht 5' 8 (1.727 m)   Wt 193 lb 6.4 oz (87.7 kg)   SpO2 96%   BMI 29.41 kg/m     Wt Readings from Last 3 Encounters:  02/14/24 193 lb 6.4 oz (87.7 kg)  02/08/24 194 lb 7.1 oz (88.2 kg)  01/25/24 198 lb 3.1 oz (89.9 kg)     GEN: Well nourished, well developed in no acute distress NECK: No JVD CARDIAC:  RRR, no murmurs, rubs, gallops RESPIRATORY:  Clear to auscultation without rales, wheezing or rhonchi  ABDOMEN: Soft, non-tender, non-distended EXTREMITIES:  No edema; No deformity.    ASSESSMENT:    1. S/P TAVR (transcatheter aortic valve replacement)   2. HFrEF (heart failure with reduced ejection fraction) (HCC)   3. VT  (ventricular tachycardia) (HCC)   4. Coronary artery disease of native artery of native heart with stable angina pectoris (HCC)   5. Carotid artery disease, unspecified laterality (HCC)   6. Deep vein thrombosis (DVT) of other vein of left upper extremity, unspecified chronicity (HCC)   7. Anemia, unspecified type   8. Mixed hyperlipidemia     PLAN:    In order of problems listed above:  Severe AS s/p TAVR:  -- Echo today shows EF 35%, mod LVH, mod-severely dilated, mod MAC, normally functioning TAVR with a mean gradient of 6 mm hg and no PVL.  -- NYHA class I symptoms.  -- Continue aspirin  81mg  daily and Plavix  75mg  daily.   -- SBE prophylaxis discussed; I have RX'd amoxicillin . She has an infected tooth- strongly advised her to have it addressed. Given Dr. Jettie information.  -- Continue regular follow up with Dr. Elmira  HFrEF:  -- EF 35% today.  -- Appears euvolemic.  -- Continue losartan  25mg  daily, spiro 25 mg daily, farxiga  10mg  daily. Refuses BB and Entresto .    Refractory VT:  -- In the setting of acute illness.  -- No further issues.  -- Now off amiodarone .    CAD: -- HX PCI to RCA in 2021. -- Presented with unstable angina. S/p PCI/stent to LAD on 01/27/23. Stent thrombosis 08/09 treated with thrombectomy -- LHC 08/12 with patent LAD stent -- Continue on Plavix  75mg  daily + Aspirin  81mg  daily.  -- Refuses statin.    Carotid artery stenosis:  -- S/p R CEA in 2020. -- Continue Crestor  10mg  daily.   DVT left internal jugular: -- Completed OAC therapy.   Anemia: -- Hg in normal range at 12.2 when checked in 10/2023. (Personally reviewed).   HLD: -- LDL above goal at 82. -- Refuses statin.    Medication Adjustments/Labs and Tests Ordered: Current medicines are reviewed at length with the patient today.  Concerns regarding medicines are outlined above.  No  orders of the defined types were placed in this encounter.  Meds ordered this encounter   Medications   DISCONTD: amoxicillin  (AMOXIL ) 500 MG tablet    Sig: Take 4 tablets (2,000 mg total) by mouth as directed. 1 HOUR PRIOR TO DENTAL PROCEDURES, INCLUDING CLEANINGS    Dispense:  12 tablet    Refill:  12   amoxicillin  (AMOXIL ) 500 MG tablet    Sig: Take 4 tablets (2,000 mg total) by mouth as directed. 1 HOUR PRIOR TO DENTAL PROCEDURES, INCLUDING CLEANINGS    Dispense:  12 tablet    Refill:  12    Patient Instructions  Medication Instructions:  Take Amoxicillin  500 mg 4 tablets by mouth 1 hour prior to dental procedures and cleanings.  *If you need a refill on your cardiac medications before your next appointment, please call your pharmacy*  Lab Work: None needed If you have labs (blood work) drawn today and your tests are completely normal, you will receive your results only by: MyChart Message (if you have MyChart) OR A paper copy in the mail If you have any lab test that is abnormal or we need to change your treatment, we will call you to review the results.  Testing/Procedures: None needed  Follow-Up: At [facility], you and your health needs are our priority.  As part of our continuing mission to provide you with exceptional heart care, our providers are all part of one team.  This team includes your primary Cardiologist (physician) and Advanced Practice Providers or APPs (Physician Assistants and Nurse Practitioners) who all work together to provide you with the care you need, when you need it.  Your next appointment:   As scheduled  We recommend signing up for the patient portal called MyChart.  Sign up information is provided on this After Visit Summary.  MyChart is used to connect with patients for Virtual Visits (Telemedicine).  Patients are able to view lab/test results, encounter notes, upcoming appointments, etc.  Non-urgent messages can be sent to your provider as well.   To learn more about what you can do with MyChart, go to  [email or web address].   Other Instructions   [facility] Krystal MATSU. Owsley  [address] [place], [place] [address] [phone]        Signed, Lamarr Hummer, PA-C  02/14/2024 3:23 PM    [facility]

## 2024-02-14 ENCOUNTER — Ambulatory Visit: Admitting: Physician Assistant

## 2024-02-14 ENCOUNTER — Ambulatory Visit (HOSPITAL_COMMUNITY)
Admission: RE | Admit: 2024-02-14 | Discharge: 2024-02-14 | Disposition: A | Discharge status: Home / Self Care | Source: Ambulatory Visit | Attending: Cardiology | Admitting: Cardiology

## 2024-02-14 VITALS — BP 134/76 | HR 82 | Ht 68.0 in | Wt 193.4 lb

## 2024-02-14 DIAGNOSIS — Z952 Presence of prosthetic heart valve: Secondary | ICD-10-CM | POA: Insufficient documentation

## 2024-02-14 DIAGNOSIS — I25118 Atherosclerotic heart disease of native coronary artery with other forms of angina pectoris: Secondary | ICD-10-CM | POA: Insufficient documentation

## 2024-02-14 DIAGNOSIS — I779 Disorder of arteries and arterioles, unspecified: Secondary | ICD-10-CM | POA: Diagnosis not present

## 2024-02-14 DIAGNOSIS — E782 Mixed hyperlipidemia: Secondary | ICD-10-CM | POA: Diagnosis present

## 2024-02-14 DIAGNOSIS — I472 Ventricular tachycardia, unspecified: Secondary | ICD-10-CM | POA: Diagnosis present

## 2024-02-14 DIAGNOSIS — D649 Anemia, unspecified: Secondary | ICD-10-CM | POA: Diagnosis present

## 2024-02-14 DIAGNOSIS — I82622 Acute embolism and thrombosis of deep veins of left upper extremity: Secondary | ICD-10-CM | POA: Diagnosis present

## 2024-02-14 DIAGNOSIS — I5020 Unspecified systolic (congestive) heart failure: Secondary | ICD-10-CM | POA: Insufficient documentation

## 2024-02-14 LAB — ECHOCARDIOGRAM COMPLETE
AR max vel: 2.19 cm2
AV Area VTI: 2.16 cm2
AV Area mean vel: 2.13 cm2
AV Mean grad: 6 mmHg
AV Peak grad: 10.3 mmHg
Ao pk vel: 1.61 m/s
Est EF: 35
S' Lateral: 4 cm

## 2024-02-14 MED ORDER — AMOXICILLIN 500 MG PO TABS: 2000.0000 mg | ORAL_TABLET | ORAL | 12 refills | Status: AC

## 2024-02-14 MED ORDER — AMOXICILLIN 500 MG PO TABS: 2000.0000 mg | ORAL_TABLET | ORAL | 12 refills | Status: DC

## 2024-02-14 NOTE — Patient Instructions (Addendum)
 Medication Instructions:  Take Amoxicillin  500 mg 4 tablets by mouth 1 hour prior to dental procedures and cleanings.  *If you need a refill on your cardiac medications before your next appointment, please call your pharmacy*  Lab Work: None needed If you have labs (blood work) drawn today and your tests are completely normal, you will receive your results only by: MyChart Message (if you have MyChart) OR A paper copy in the mail If you have any lab test that is abnormal or we need to change your treatment, we will call you to review the results.  Testing/Procedures: None needed  Follow-Up: At [facility], you and your health needs are our priority.  As part of our continuing mission to provide you with exceptional heart care, our providers are all part of one team.  This team includes your primary Cardiologist (physician) and Advanced Practice Providers or APPs (Physician Assistants and Nurse Practitioners) who all work together to provide you with the care you need, when you need it.  Your next appointment:   As scheduled  We recommend signing up for the patient portal called MyChart.  Sign up information is provided on this After Visit Summary.  MyChart is used to connect with patients for Virtual Visits (Telemedicine).  Patients are able to view lab/test results, encounter notes, upcoming appointments, etc.  Non-urgent messages can be sent to your provider as well.   To learn more about what you can do with MyChart, go to [email or web address].   Other Instructions  [facility] Krystal MATSU. Owsley  [address] [place], [place] [address] [phone]

## 2024-02-15 ENCOUNTER — Encounter (HOSPITAL_COMMUNITY)
Admission: RE | Admit: 2024-02-15 | Discharge: 2024-02-15 | Disposition: A | Discharge status: Home / Self Care | Source: Ambulatory Visit | Attending: Pulmonary Disease | Admitting: Pulmonary Disease

## 2024-02-15 DIAGNOSIS — R0609 Other forms of dyspnea: Secondary | ICD-10-CM

## 2024-02-15 NOTE — Progress Notes (Signed)
 Daily Session Note  Patient Details  Name: Madison West MRN: [id number] Date of Birth: 1955-10-31 Referring Provider:   Conrad Ports Pulmonary Rehab Walk Test from 12/01/2023 in [facility]  Referring Provider Mannam    Encounter Date: 02/15/2024  Check In:  Session Check In - 02/15/24 1347       Check-In   Supervising physician immediately available to respond to emergencies [facility] MD immediately available    Physician(s) Rosaline Bane, NP    [facility]    Staff Present Johnnie Moats, MS, ACSM-CEP, Exercise Physiologist;Casey Claudene Candia Levin, RN, BSN;Randi Reeve BS, ACSM-CEP, Exercise Physiologist    Virtual Visit No    Medication changes reported     No    Fall or balance concerns reported    No    Tobacco Cessation No Change    Warm-up and Cool-down Performed as group-led instruction    Resistance Training Performed Yes    VAD Patient? No    PAD/SET Patient? No      Pain Assessment   Currently in Pain? No/denies    Pain Score 0-No pain    Multiple Pain Sites No          Capillary Blood Glucose: No results found for this or any previous visit (from the past 24 hours).    Social History   Tobacco Use  Smoking Status Former   Current packs/day: 0.00   Average packs/day: 0.8 packs/day for 20.0 years (15.0 ttl pk-yrs)   Types: Cigarettes   Start date: 05/06/1977   Quit date: 05/06/1997   Years since quitting: 26.7  Smokeless Tobacco Never    Goals Met:  Independence with exercise equipment Exercise tolerated well No report of concerns or symptoms today Strength training completed today  Goals Unmet:  Not Applicable  Comments: Service time is from 1320 to 1433    Dr. Slater Staff is Medical Director for Pulmonary Rehab at [facility].

## 2024-02-16 ENCOUNTER — Encounter: Payer: Self-pay | Admitting: Pulmonary Disease

## 2024-02-16 ENCOUNTER — Ambulatory Visit (INDEPENDENT_AMBULATORY_CARE_PROVIDER_SITE_OTHER): Admitting: Pulmonary Disease

## 2024-02-16 VITALS — BP 110/60 | HR 90 | Temp 97.9°F | Wt 193.0 lb

## 2024-02-16 DIAGNOSIS — R042 Hemoptysis: Secondary | ICD-10-CM

## 2024-02-16 DIAGNOSIS — R0609 Other forms of dyspnea: Secondary | ICD-10-CM | POA: Diagnosis not present

## 2024-02-16 DIAGNOSIS — J849 Interstitial pulmonary disease, unspecified: Secondary | ICD-10-CM | POA: Diagnosis not present

## 2024-02-16 NOTE — Patient Instructions (Addendum)
  VISIT SUMMARY: During your visit, we discussed your recent imaging results and symptoms related to your heart failure, hemoptysis, and other health concerns. We reviewed your echocardiogram, which showed a slight change in your heart function, and addressed your concerns about shortness of breath, oxygen use, and recent hemoptysis. We also discussed your peripheral symptoms and medication concerns, as well as your musculoskeletal symptoms during rehabilitation activities.  YOUR PLAN: -HEMOPTYSIS: Hemoptysis means coughing up blood. Your recent recurrence of hemoptysis may be related to your use of aspirin  and Plavix .  Your recent CT scan does not show any clear source of bleeding.  Please let us  know if the hemoptysis persists or worsens.  -HEART FAILURE WITH REDUCED EJECTION FRACTION: Heart failure with reduced ejection fraction means your heart is not pumping blood as well as it should. Your recent echocardiogram shows an ejection fraction of 35%, which is a slight change from your previous measurement. We will send a message to your cardiologist to review the recent echocardiogram and provide feedback.  -MILD INTERSTITIAL LUNG DISEASE: Mild interstitial lung disease involves inflammation or scarring of the lung tissue. Your CT scan shows mild interstitial lung disease, possibly due to fluid from heart failure or mild inflammation. We will order lung function tests to assess your lung performance.  INSTRUCTIONS: Please schedule a follow-up appointment in six months for further evaluation. If your hemoptysis persists or worsens, we will order a follow-up CT scan. We will also send a message to your cardiologist to review your recent echocardiogram and provide feedback.  Contains text generated by [employer]bridge.

## 2024-02-16 NOTE — Progress Notes (Unsigned)
 Madison West    [id number]    [birth year]  Primary Care Physician:White, Aldona CROME, NP  Referring Physician: Teresa Aldona CROME, NP [phone]  [place] [address]  Chief complaint: Follow-up for hemoptysis  HPI: 68 y.o. who  has a past medical history of Anxiety, Aortic stenosis, Arthritis, Breast cancer (HCC) (06/02/2007), Carotid artery disease (HCC), Carpal tunnel syndrome, CHF (congestive heart failure) (HCC), Colon polyps, Complication of anesthesia, Coronary artery disease, Depression, DVT (deep venous thrombosis) (HCC), Dysplasia of vulva, Hypertension, Palpitations, S/P breast lumpectomy (07/04/2007), S/P radiation therapy (2009), and Vulva cancer (HCC).  Discussed the use of AI scribe software for clinical note transcription with the patient, who gave verbal consent to proceed.  History of Present Illness Madison West is a 68 year old female with heart failure and aortic stenosis who presents with persistent hemoptysis.  She has been experiencing hemoptysis for nine months, characterized by coughing up burgundy-colored blood clots approximately three times a day. This began after liberation from the right due to right six-week hospitalization in August for heart failure, MI, severe aortic stenosis, during which she underwent aortic valve replacement (TAVR) and stent placement.  Her hospitalization was due to heart failure symptoms, including shortness of breath, fluid retention, and severe heartburn. Prior to this, she was treated for pneumonia with antibiotics multiple times. During the hospital stay, she experienced cardiac arrest and was treated with a balloon pump. She was discharged with a diagnosis of severe aortic stenosis status post TAVR and a reduced ejection fraction of 30-35%.  Also diagnosed with a left IJ clot secondary to central venous line for which she received over 6 months of anticoagulation therapy.  She follows with Dr.  Elmira.  She is currently on Plavix  and aspirin , having stopped Eliquis  and amiodarone  in March of 2025. No recent travel, exposure to mold, or use of feather pillows. She has not experienced recent acid reflux or indigestion, except when consuming raw onions.  She has a history of smoking, with a 15 pack-year history, but quit approximately four years ago. Her medical history also includes breast cancer treated in 2009 and a carotid artery procedure in 2020. Her family history is significant for colon cancer in her mother and a massive heart attack in her father.  Interim history: Madison West is a 68 year old female with heart failure who presents with concerns about recent imaging results and symptoms.  Cardiac dysfunction and heart failure symptoms - Concern about recent imaging results, including chest x-ray, CT scan, and echocardiogram, accessed through MyChart without follow-up explanation - Echocardiogram shows left ventricular ejection fraction of 37% - Previous ejection fraction was 40-45% after hospitalization in September 2024; prior to that, ejection fraction was 20-25% - Perceived improvement in heart function until recent imaging - Shortness of breath primarily with exertion, especially when carrying heavy groceries - No chest pain or palpitations reported  Hypoxemia and oxygen use - Uses supplemental oxygen during pulmonary rehabilitation activities such as walking and stationary biking - Requires oxygen at night due to nocturnal desaturation; checks oxygen saturation throughout the night and uses oxygen if levels are low  Hemoptysis - History of coughing up blood for nine months - Hemoptysis had resolved for two weeks but recurred over the past three days - Blood appears as small clots - Current medications include aspirin  and Plavix  - Bronchoscopy in May showed no evidence of bleeding or malignancy  Peripheral symptoms and medication concerns - Brittle  fingernails with ripples, a new symptom - Questions if current medications could be contributing to nail changes - Advised to stop CoQ10, vitamin C, and fish oil supplements after hospital discharge; uncertain if these can be resumed  Musculoskeletal symptoms with activity - Hip pain with walking - Numbness with use of stationary bike during rehabilitation  Concerns regarding communication and diagnostic findings - Frustration with lack of clear communication from healthcare providers regarding recent echocardiogram and imaging results - Concern about mention of DVT in after-visit summary, which was not discussed during appointment  Relevant pulmonary history Pets: Cats, dogs, horses.  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  Husband had a feather pillow around 2010.  No ongoing exposure Smoking history: 15-pack-year smoker.  Quit in 1998 Travel history: Originally from [place] .  No significant recent travel Relevant family history: No family history of lung disease  Outpatient Encounter Medications as of 02/16/2024  Medication Sig   aspirin  EC 81 MG tablet Take 1 tablet (81 mg total) by mouth daily. Swallow whole.   clopidogrel  (PLAVIX ) 75 MG tablet Take 1 tablet (75 mg total) by mouth daily.   furosemide  (LASIX ) 20 MG tablet Take 0.5 tablets (10 mg total) by mouth daily as needed (for lower extremity swelling and weight gain).   losartan  (COZAAR ) 25 MG tablet Take 1 tablet (25 mg total) by mouth daily.   acetaminophen  (TYLENOL ) 325 MG tablet Take 2 tablets (650 mg total) by mouth every 6 (six) hours.   alprazolam  (XANAX ) 2 MG tablet Take 0.25-2 mg by mouth See admin instructions. 1 mg (may take up to 2 mg) twice daily. May also take 0.25 mg during the day if needed for anxiety.   amoxicillin  (AMOXIL ) 500 MG tablet Take 4 tablets (2,000 mg total) by mouth as directed. 1 HOUR PRIOR TO DENTAL PROCEDURES, INCLUDING CLEANINGS    busPIRone  (BUSPAR ) 10 MG tablet TAKE 1 TABLET BY MOUTH TWICE A DAY (Patient taking differently: Take 10 mg by mouth 2 (two) times daily. Pt sometimes only takes once a day)   dapagliflozin  propanediol (FARXIGA ) 10 MG TABS tablet Take 1 tablet (10 mg total) by mouth daily before breakfast.   methocarbamol  (ROBAXIN ) 500 MG tablet Take 1 tablet (500 mg total) by mouth 2 (two) times daily. (Patient taking differently: Take 500 mg by mouth 2 (two) times daily. Only has in case she needs it)   nitroGLYCERIN  (NITROSTAT ) 0.4 MG SL tablet Place 1 tablet (0.4 mg total) under the tongue every 5 (five) minutes as needed for chest pain.   oxyCODONE  (ROXICODONE ) 5 MG immediate release tablet Take 1 tablet (5 mg total) by mouth daily as needed for severe pain (pain score 7-10).   rosuvastatin  (CRESTOR ) 10 MG tablet Take 1 tablet (10 mg total) by mouth daily.   spironolactone  (ALDACTONE ) 25 MG tablet Take 1 tablet (25 mg total) by mouth daily.   No facility-administered encounter medications on file as of 02/16/2024.   Vitals:   02/16/24 1412  Pulse: 90  Temp: 97.9 F (36.6 C)  Weight: 193 lb (87.5 kg)  SpO2: 92%  TempSrc: Oral  BMI (Calculated): 29.35     Physical Exam GEN: No acute distress CV: Regular rate and rhythm no murmurs LUNGS: Clear to auscultation bilaterally normal respiratory effort SKIN JOINTS: Warm and dry no rash    Data Reviewed: Imaging: CTA 10/16/2022-no pulm embolism, mild bronchial wall thickening, emphysema,  coronary artery calcifications CT high-resolution 02/04/2024-diffuse bilateral ground glass attenuation with bronchial thickening, lobular air trapping.  Alternate pattern I have reviewed the images personally.  PFTs:  Labs: BAL 11/10/2023 75% monocyte macrophage  CTD serologies 11/03/2023, hypersensitivity panel-negative  Assessment & Plan Hemoptysis Intermittent hemoptysis over the past nine months, with recent recurrence after a two-week absence. Previous  bronchoscopy and lymph node biopsy showed no malignancy. Blood counts and cultures were normal. Likely related to aspirin  and Plavix  use. CT scan shows slight lung inflammation, possibly due to hypersensitivity pneumonitis or fluid from heart failure. - Order follow-up CT scan if hemoptysis persists or worsens  Heart failure with reduced ejection fraction Ejection fraction currently at 37%, previously 40-45% in September 2024. Significant improvement from 20-25% after hospitalization. Recent echocardiogram shows no significant decline. Concerns about heart function and potential fluid contribution to lung issues. She expresses anxiety about heart function and its implications on her health and lifestyle. - Send message to cardiologist for review of recent echocardiogram and to provide feedback to her  Mild interstitial lung disease Mild interstitial lung disease with ground glass opacities on CT, possibly due to fluid from heart failure or mild inflammation. Differential includes hypersensitivity pneumonitis and interstitial lung disease. Previous lab work showed borderline ANA, not significant for autoimmune disease.  Hypersensitivity panel was negative and she does not have significant exposure history - Order lung function tests to assess lung performance  Follow-Up She will be seen in six months for follow-up evaluation. - Schedule follow-up appointment in six months  Recommendations: PFTs  Lonna Coder MD [facility] 02/16/2024, 2:15 PM  CC: Teresa Aldona CROME, NP

## 2024-02-17 ENCOUNTER — Encounter (HOSPITAL_COMMUNITY)
Admission: RE | Admit: 2024-02-17 | Discharge: 2024-02-17 | Disposition: A | Discharge status: Home / Self Care | Source: Ambulatory Visit | Attending: Pulmonary Disease | Admitting: Pulmonary Disease

## 2024-02-17 DIAGNOSIS — R0609 Other forms of dyspnea: Secondary | ICD-10-CM

## 2024-02-17 NOTE — Progress Notes (Signed)
 Pt has been scheduled for 02/25/2024 at 1300. Pt aware.

## 2024-02-17 NOTE — Progress Notes (Signed)
 Daily Session Note  Patient Details  Name: Madison West MRN: [id number] Date of Birth: [birth year] Referring Provider:   Conrad Ports Pulmonary Rehab Walk Test from 12/01/2023 in [facility]  Referring Provider Mannam    Encounter Date: 02/17/2024  Check In:  Session Check In - 02/17/24 1515       Check-In   Supervising physician immediately available to respond to emergencies [facility] MD immediately available    Physician(s) Jackee Alberts, NP    Location [facility]    Staff Present Johnnie Moats, MS, ACSM-CEP, Exercise Physiologist;Casey Claudene Candia Levin, RN, BSN;Jream Broyles BS, ACSM-CEP, Exercise Physiologist    Virtual Visit No    Medication changes reported     No    Fall or balance concerns reported    No    Tobacco Cessation No Change    Warm-up and Cool-down Performed as group-led instruction    Resistance Training Performed Yes    VAD Patient? No    PAD/SET Patient? No      Pain Assessment   Currently in Pain? No/denies          Capillary Blood Glucose: No results found for this or any previous visit (from the past 24 hours).    Social History   Tobacco Use  Smoking Status Former   Current packs/day: 0.00   Average packs/day: 0.8 packs/day for 20.0 years (15.0 ttl pk-yrs)   Types: Cigarettes   Start date: 05/06/1977   Quit date: 05/06/1997   Years since quitting: 26.8  Smokeless Tobacco Never    Goals Met:  Independence with exercise equipment Exercise tolerated well No report of concerns or symptoms today Strength training completed today  Goals Unmet:  Not Applicable  Comments: Service time is from 1338 to 1430.    Dr. Slater Staff is Medical Director for Pulmonary Rehab at [facility].

## 2024-02-17 NOTE — Progress Notes (Signed)
 Reviewed echocardiogram. EF remains low. We need to discuss a few things. Consider ICD given that EF has remained low Discuss interruption of Plavix  and general cardiac risk with undergoing bronchoscopy, as recommended by Dr. Theophilus.  These may be best discussed in the office. If possible, schedule an appt in coming week or two ( I am aware that I am out of office around labor day).  Thanks MJP

## 2024-02-22 ENCOUNTER — Encounter (HOSPITAL_COMMUNITY)
Admission: RE | Admit: 2024-02-22 | Discharge: 2024-02-22 | Disposition: A | Discharge status: Home / Self Care | Source: Ambulatory Visit | Attending: Cardiology | Admitting: Cardiology

## 2024-02-22 VITALS — Wt 196.9 lb

## 2024-02-22 DIAGNOSIS — R0609 Other forms of dyspnea: Secondary | ICD-10-CM | POA: Diagnosis present

## 2024-02-22 NOTE — Progress Notes (Signed)
 Daily Session Note  Patient Details  Name: Mylena Sedberry MRN: [id number] Date of Birth: [birth year] Referring Provider:   Conrad Ports Pulmonary Rehab Walk Test from 12/01/2023 in [facility]  Referring Provider Mannam    Encounter Date: 02/22/2024  Check In:  Session Check In - 02/22/24 1327       Check-In   Supervising physician immediately available to respond to emergencies [facility] MD immediately available    Physician(s) Josefa Beauvais, NP    Location [facility]    Staff Present Johnnie Moats, MS, ACSM-CEP, Exercise Physiologist;Haralambos Yeatts Claudene Candia Levin, RN, BSN;Randi Reeve BS, ACSM-CEP, Exercise Physiologist    Virtual Visit No    Medication changes reported     No    Fall or balance concerns reported    No    Tobacco Cessation No Change    Warm-up and Cool-down Performed as group-led instruction    Resistance Training Performed Yes    VAD Patient? No    PAD/SET Patient? No      Pain Assessment   Currently in Pain? No/denies    Pain Score 0-No pain    Multiple Pain Sites No          Capillary Blood Glucose: No results found for this or any previous visit (from the past 24 hours).   Exercise Prescription Changes - 02/22/24 1400       Response to Exercise   Blood Pressure (Admit) 144/80    Blood Pressure (Exercise) 152/72    Blood Pressure (Exit) 122/70    Heart Rate (Admit) 97 bpm    Heart Rate (Exercise) 106 bpm    Heart Rate (Exit) 101 bpm    Oxygen Saturation (Admit) 93 %    Oxygen Saturation (Exercise) 97 %    Oxygen Saturation (Exit) 94 %    Rating of Perceived Exertion (Exercise) 11    Perceived Dyspnea (Exercise) 1    Duration Continue with 30 min of aerobic exercise without signs/symptoms of physical distress.    Intensity THRR unchanged      Progression   Progression Continue to progress workloads to maintain intensity without signs/symptoms of physical distress.       Resistance Training   Training Prescription Yes    Weight blue bands    Reps 10-15    Time 10 Minutes      Oxygen   Oxygen Continuous    Liters 2      Bike   Level 3.5    Watts 36    Minutes 15    METs 4.1      Track   Laps 8    METs 2.2      Oxygen   Maintain Oxygen Saturation 88% or higher          Social History   Tobacco Use  Smoking Status Former   Current packs/day: 0.00   Average packs/day: 0.8 packs/day for 20.0 years (15.0 ttl pk-yrs)   Types: Cigarettes   Start date: 05/06/1977   Quit date: 05/06/1997   Years since quitting: 26.8  Smokeless Tobacco Never    Goals Met:  Proper associated with RPD/PD & O2 Sat Independence with exercise equipment Exercise tolerated well No report of concerns or symptoms today Strength training completed today  Goals Unmet:  Not Applicable  Comments: Service time is from 1326 to 1427.    Dr. Slater Staff is Medical Director for Pulmonary Rehab at [facility].

## 2024-02-22 NOTE — Progress Notes (Signed)
 Home Exercise Prescription I have reviewed a Home Exercise Prescription with Madison West. She is not currently exercising. Discussed option to join [employer] or [facility] (she could do Pulmonary Wellness program there). She wants to look in to it. Encouraged walking or the aerobic machines like the scifit bike she is doing in [place]. Encouraged 30 min of exercise, 3-5 days a week. The patient stated that their goals were to lose 20 lbs and to keep living. We reviewed exercise guidelines, target heart rate during exercise, RPE Scale, weather conditions, endpoints for exercise, warmup and cool down. The patient is encouraged to come to me with any questions. I will continue to follow up with the patient to assist them with progression and safety.  Spent 15 min discussing home exercise plan and goals.  Madison West, [place], ACSM-CEP 02/22/2024 3:35 PM

## 2024-02-23 NOTE — Progress Notes (Signed)
 Pulmonary Individual Treatment Plan  Patient Details  Name: Madison West MRN: [id number] Date of Birth: Jun 09, 1956 Referring Provider:   Conrad Ports Pulmonary Rehab Walk Test from 12/01/2023 in [facility]  Referring Provider Mannam    Initial Encounter Date:  Flowsheet Row Pulmonary Rehab Walk Test from 12/01/2023 in [facility]  Date 12/01/23    Visit Diagnosis: DOE (dyspnea on exertion)  Patient's Home Medications on Admission:   Current Outpatient Medications:    acetaminophen  (TYLENOL ) 325 MG tablet, Take 2 tablets (650 mg total) by mouth every 6 (six) hours., Disp: , Rfl:    alprazolam  (XANAX ) 2 MG tablet, Take 0.25-2 mg by mouth See admin instructions. 1 mg (may take up to 2 mg) twice daily. May also take 0.25 mg during the day if needed for anxiety., Disp: , Rfl:    amoxicillin  (AMOXIL ) 500 MG tablet, Take 4 tablets (2,000 mg total) by mouth as directed. 1 HOUR PRIOR TO DENTAL PROCEDURES, INCLUDING CLEANINGS (Patient not taking: Reported on 02/16/2024), Disp: 12 tablet, Rfl: 12   aspirin  EC 81 MG tablet, Take 1 tablet (81 mg total) by mouth daily. Swallow whole., Disp: 90 tablet, Rfl: 3   busPIRone  (BUSPAR ) 10 MG tablet, TAKE 1 TABLET BY MOUTH TWICE A DAY, Disp: 180 tablet, Rfl: 1   clopidogrel  (PLAVIX ) 75 MG tablet, Take 1 tablet (75 mg total) by mouth daily., Disp: 90 tablet, Rfl: 3   dapagliflozin  propanediol (FARXIGA ) 10 MG TABS tablet, Take 1 tablet (10 mg total) by mouth daily before breakfast., Disp: 90 tablet, Rfl: 3   furosemide  (LASIX ) 20 MG tablet, Take 0.5 tablets (10 mg total) by mouth daily as needed (for lower extremity swelling and weight gain)., Disp: 30 tablet, Rfl: 3   losartan  (COZAAR ) 25 MG tablet, Take 1 tablet (25 mg total) by mouth daily., Disp: 90 tablet, Rfl: 3   methocarbamol  (ROBAXIN ) 500 MG tablet, Take 1 tablet (500 mg total) by  mouth 2 (two) times daily., Disp: 60 tablet, Rfl: 0   nitroGLYCERIN  (NITROSTAT ) 0.4 MG SL tablet, Place 1 tablet (0.4 mg total) under the tongue every 5 (five) minutes as needed for chest pain., Disp: 25 tablet, Rfl: 2   oxyCODONE  (ROXICODONE ) 5 MG immediate release tablet, Take 1 tablet (5 mg total) by mouth daily as needed for severe pain (pain score 7-10)., Disp: 30 tablet, Rfl: 0   rosuvastatin  (CRESTOR ) 10 MG tablet, Take 1 tablet (10 mg total) by mouth daily., Disp: 90 tablet, Rfl: 3   spironolactone  (ALDACTONE ) 25 MG tablet, Take 1 tablet (25 mg total) by mouth daily., Disp: 90 tablet, Rfl: 3  Past Medical History: Past Medical History:  Diagnosis Date   Anxiety    Aortic stenosis    s/p TAVR 02/04/23   Arthritis    low back and hip pain intermittent   Breast cancer (HCC) 06/02/2007   r breast -surgery ,radiaology. chemotherapy   Carotid artery disease (HCC)    Carpal tunnel syndrome    right hand   CHF (congestive heart failure) (HCC)    Colon polyps    Complication of anesthesia    Fentanyl , Versed -makes extra hyper, bradycardia x 1 in PACU, [facility] (08/15/11 cardiology felt neostigmine may have resulted in AV nodal block)    Coronary artery disease    Depression    denies   DVT (deep venous thrombosis) (HCC)    provoked left  IJ 02/10/23   Dysplasia of vulva    Hypertension    Palpitations    PSVT, s/p adenosine  08/04/16   S/P breast lumpectomy 07/04/2007   R breast   S/P radiation therapy 2009   Vulva cancer (HCC)     Tobacco Use: Social History   Tobacco Use  Smoking Status Former   Current packs/day: 0.00   Average packs/day: 0.8 packs/day for 20.0 years (15.0 ttl pk-yrs)   Types: Cigarettes   Start date: 05/06/1977   Quit date: 05/06/1997   Years since quitting: 26.8  Smokeless Tobacco Never    Labs: Review Flowsheet  More data exists      Latest Ref Rng & Units 02/13/2023 02/14/2023 06/21/2023 09/02/2023 01/10/2024  Labs for ITP Cardiac and Pulmonary Rehab   Cholestrol 100 - 199 mg/dL - - 815  856  840   LDL (calc) 0 - 99 mg/dL - - 94  71  82   HDL-C >39 mg/dL - - 67  54  54   Trlycerides 0 - 149 mg/dL - - 867  99  871   O2 Saturation % 68.3  60.4  - - -    Capillary Blood Glucose: Lab Results  Component Value Date   GLUCAP 87 02/19/2023   GLUCAP 103 (H) 02/19/2023   GLUCAP 121 (H) 02/18/2023   GLUCAP 111 (H) 02/18/2023   GLUCAP 110 (H) 02/17/2023     Pulmonary Assessment Scores:  Pulmonary Assessment Scores     Row Name 12/01/23 1314         ADL UCSD   ADL Phase Entry     SOB Score total 28       CAT Score   CAT Score 10       mMRC Score   mMRC Score 2       UCSD: Self-administered rating of dyspnea associated with activities of daily living (ADLs) 6-point scale (0 = not at all to 5 = maximal or unable to do because of breathlessness)  Scoring Scores range from 0 to 120.  Minimally important difference is 5 units  CAT: CAT can identify the health impairment of COPD patients and is better correlated with disease progression.  CAT has a scoring range of zero to 40. The CAT score is classified into four groups of low (less than 10), medium (10 - 20), high (21-30) and very high (31-40) based on the impact level of disease on health status. A CAT score over 10 suggests significant symptoms.  A worsening CAT score could be explained by an exacerbation, poor medication adherence, poor inhaler technique, or progression of COPD or comorbid conditions.  CAT MCID is 2 points  mMRC: mMRC (Modified Medical Research Council) Dyspnea Scale is used to assess the degree of baseline functional disability in patients of respiratory disease due to dyspnea. No minimal important difference is established. A decrease in score of 1 point or greater is considered a positive change.   Pulmonary Function Assessment:  Pulmonary Function Assessment - 12/01/23 1522       Breath   Bilateral Breath Sounds Decreased    Shortness of Breath  Yes;Limiting activity          Exercise Target Goals: Exercise Program Goal: Individual exercise prescription set using results from initial 6 min walk test and THRR while considering  patient's activity barriers and safety.   Exercise Prescription Goal: Initial exercise prescription builds to 30-45 minutes a day of aerobic activity, 2-3 days per week.  Home  exercise guidelines will be given to patient during program as part of exercise prescription that the participant will acknowledge.  Activity Barriers & Risk Stratification:  Activity Barriers & Cardiac Risk Stratification - 12/01/23 1312       Activity Barriers & Cardiac Risk Stratification   Activity Barriers Deconditioning;Back Problems;Neck/Spine Problems;Balance Concerns;Decreased Ventricular Function;Joint Problems;Muscular Weakness    Cardiac Risk Stratification --          6 Minute Walk:  6 Minute Walk     Row Name 11/01/23 1300 12/01/23 1439       6 Minute Walk   Phase Discharge Initial    Distance 1298 feet 1170 feet    Distance % Change 10 % --    Distance Feet Change 118 ft --    Walk Time 6 minutes 6 minutes    # of Rest Breaks 0 0    MPH 2.5 2.22    METS 2.91 2.81    RPE 13 12    Perceived Dyspnea  1 1    VO2 Peak 10.2 9.83    Symptoms Yes (comment) Yes (comment)  c/o 3/10 hip pain with distance    Comments SaO2 dropped to 88% on 2L @ 4 minutes-, increased to 3L. Pt had mils SOB, RPD = 1.  2/10 bilateral leg pain --    Resting HR 81 bpm 85 bpm    Resting BP 120/60 100/58    Resting Oxygen Saturation  90 % 97 %    Exercise Oxygen Saturation  during 6 min walk 88 % 87 %    Max Ex. HR 108 bpm 112 bpm    Max Ex. BP 140/60 146/70    2 Minute Post BP -- 130/70      Interval HR   1 Minute HR -- 99    2 Minute HR -- 104    3 Minute HR -- 109    4 Minute HR -- 112    5 Minute HR -- 112    6 Minute HR -- 112    2 Minute Post HR -- 93    Interval Heart Rate? -- Yes      Interval Oxygen   Interval  Oxygen? Yes Yes    Baseline Oxygen Saturation % -- 97 %    1 Minute Oxygen Saturation % 93 % 89 %    1 Minute Liters of Oxygen 2 L 0 L    2 Minute Oxygen Saturation % 89 % 91 %  87 RA 1:40, up to 1L    2 Minute Liters of Oxygen 2 L 1 L    3 Minute Oxygen Saturation % 89 % 93 %    3 Minute Liters of Oxygen 2 L 1 L    4 Minute Oxygen Saturation % 88 % 92 %    4 Minute Liters of Oxygen 2 L  increased to 3L 1 L    5 Minute Oxygen Saturation % 91 % 93 %    5 Minute Liters of Oxygen 3 L 1 L    6 Minute Oxygen Saturation % 91 % 92 %    6 Minute Liters of Oxygen 3 L 1 L    2 Minute Post Oxygen Saturation % 93 % 97 %    2 Minute Post Liters of Oxygen 2 L 1 L       Oxygen Initial Assessment:  Oxygen Initial Assessment - 12/01/23 1313       Home Oxygen   Home Oxygen  Device Home Concentrator;E-Tanks    Sleep Oxygen Prescription Continuous    Liters per minute 4    Home Exercise Oxygen Prescription Continuous    Liters per minute 4    Home Resting Oxygen Prescription Continuous    Liters per minute 4    Compliance with Home Oxygen Use No      Initial 6 min Walk   Oxygen Used Continuous    Liters per minute 1      Program Oxygen Prescription   Program Oxygen Prescription Continuous    Liters per minute 1      Intervention   Short Term Goals To learn and exhibit compliance with exercise, home and travel O2 prescription;To learn and understand importance of maintaining oxygen saturations>88%;To learn and understand importance of monitoring SPO2 with pulse oximeter and demonstrate accurate use of the pulse oximeter.;To learn and demonstrate proper pursed lip breathing techniques or other breathing techniques.     Long  Term Goals Exhibits compliance with exercise, home  and travel O2 prescription;Maintenance of O2 saturations>88%;Verbalizes importance of monitoring SPO2 with pulse oximeter and return demonstration;Exhibits proper breathing techniques, such as pursed lip breathing or other  method taught during program session          Oxygen Re-Evaluation:  Oxygen Re-Evaluation     Row Name 12/21/23 0917 01/20/24 0740 02/15/24 0912         Program Oxygen Prescription   Program Oxygen Prescription Continuous Continuous Continuous     Liters per minute 1 1 1      Comments -- can intermittently exercise on RA. Fluctuates between 0-2L recently consistently using 2L       Home Oxygen   Home Oxygen Device Home Concentrator;E-Tanks Home Concentrator;E-Tanks Home Concentrator;E-Tanks     Sleep Oxygen Prescription Continuous Continuous Continuous     Liters per minute 4 4 4      Home Exercise Oxygen Prescription Continuous Continuous Continuous     Liters per minute 4 4 4      Home Resting Oxygen Prescription Continuous Continuous Continuous     Liters per minute 4 4 4      Compliance with Home Oxygen Use No No No       Goals/Expected Outcomes   Short Term Goals To learn and exhibit compliance with exercise, home and travel O2 prescription;To learn and understand importance of maintaining oxygen saturations>88%;To learn and understand importance of monitoring SPO2 with pulse oximeter and demonstrate accurate use of the pulse oximeter.;To learn and demonstrate proper pursed lip breathing techniques or other breathing techniques.  To learn and exhibit compliance with exercise, home and travel O2 prescription;To learn and understand importance of maintaining oxygen saturations>88%;To learn and understand importance of monitoring SPO2 with pulse oximeter and demonstrate accurate use of the pulse oximeter.;To learn and demonstrate proper pursed lip breathing techniques or other breathing techniques.  To learn and exhibit compliance with exercise, home and travel O2 prescription;To learn and understand importance of maintaining oxygen saturations>88%;To learn and understand importance of monitoring SPO2 with pulse oximeter and demonstrate accurate use of the pulse oximeter.;To learn and  demonstrate proper pursed lip breathing techniques or other breathing techniques.      Long  Term Goals Exhibits compliance with exercise, home  and travel O2 prescription;Maintenance of O2 saturations>88%;Verbalizes importance of monitoring SPO2 with pulse oximeter and return demonstration;Exhibits proper breathing techniques, such as pursed lip breathing or other method taught during program session Exhibits compliance with exercise, home  and travel O2 prescription;Maintenance of O2 saturations>88%;Verbalizes importance of  monitoring SPO2 with pulse oximeter and return demonstration;Exhibits proper breathing techniques, such as pursed lip breathing or other method taught during program session Exhibits compliance with exercise, home  and travel O2 prescription;Maintenance of O2 saturations>88%;Verbalizes importance of monitoring SPO2 with pulse oximeter and return demonstration;Exhibits proper breathing techniques, such as pursed lip breathing or other method taught during program session     Comments pt has only had 2 sessions, used 2L last session for unknown reasons. Will monitor 0-2L 0-2L     Goals/Expected Outcomes Compliance and understanding of oxygen saturation monitoring and breathing techniques to decrease shortness of breath. Compliance and understanding of oxygen saturation monitoring and breathing techniques to decrease shortness of breath. Compliance and understanding of oxygen saturation monitoring and breathing techniques to decrease shortness of breath.        Oxygen Discharge (Final Oxygen Re-Evaluation):  Oxygen Re-Evaluation - 02/15/24 0912       Program Oxygen Prescription   Program Oxygen Prescription Continuous    Liters per minute 1    Comments recently consistently using 2L      Home Oxygen   Home Oxygen Device Home Concentrator;E-Tanks    Sleep Oxygen Prescription Continuous    Liters per minute 4    Home Exercise Oxygen Prescription Continuous    Liters per minute 4     Home Resting Oxygen Prescription Continuous    Liters per minute 4    Compliance with Home Oxygen Use No      Goals/Expected Outcomes   Short Term Goals To learn and exhibit compliance with exercise, home and travel O2 prescription;To learn and understand importance of maintaining oxygen saturations>88%;To learn and understand importance of monitoring SPO2 with pulse oximeter and demonstrate accurate use of the pulse oximeter.;To learn and demonstrate proper pursed lip breathing techniques or other breathing techniques.     Long  Term Goals Exhibits compliance with exercise, home  and travel O2 prescription;Maintenance of O2 saturations>88%;Verbalizes importance of monitoring SPO2 with pulse oximeter and return demonstration;Exhibits proper breathing techniques, such as pursed lip breathing or other method taught during program session    Comments 0-2L    Goals/Expected Outcomes Compliance and understanding of oxygen saturation monitoring and breathing techniques to decrease shortness of breath.          Initial Exercise Prescription:  Initial Exercise Prescription - 12/01/23 1400       Date of Initial Exercise RX and Referring Provider   Date 12/01/23    Referring Provider Mannam    Expected Discharge Date 02/29/24      Oxygen   Oxygen Continuous    Liters 1    Maintain Oxygen Saturation 88% or higher      Bike   Level 2    Watts 70    Minutes 15    METs 2.2      Track   Laps 8    Minutes 15    METs 2.2      Prescription Details   Frequency (times per week) 2    Duration Progress to 30 minutes of continuous aerobic without signs/symptoms of physical distress      Intensity   THRR 40-80% of Max Heartrate 60-120    Ratings of Perceived Exertion 11-13    Perceived Dyspnea 0-4      Progression   Progression Continue to progress workloads to maintain intensity without signs/symptoms of physical distress.      Resistance Training   Training Prescription Yes     Weight blue bands  Reps 10-15          Perform Capillary Blood Glucose checks as needed.  Exercise Prescription Changes:   Exercise Prescription Changes     Row Name 09/13/23 1500 09/22/23 1400 09/27/23 1500 10/11/23 1600 10/25/23 1700     Response to Exercise   Blood Pressure (Admit) 106/70 112/70 108/62 100/56 110/78   Blood Pressure (Exercise) -- 114/64 120/70 -- --   Blood Pressure (Exit) 94/60 94/60 91/52  120/54 110/60   Heart Rate (Admit) 73 bpm 74 bpm 76 bpm 86 bpm 75 bpm   Heart Rate (Exercise) 89 bpm 91 bpm 90 bpm 103 bpm 92 bpm   Heart Rate (Exit) 78 bpm 76 bpm 82 bpm 86 bpm 84 bpm   Oxygen Saturation (Admit) 91 % 94 % 76 % 91 % 92 %   Oxygen Saturation (Exercise) 92 % 93 % 90 % 90 % 90 %   Oxygen Saturation (Exit) 90 % 92 % 82 % 89 % 91 %   Rating of Perceived Exertion (Exercise) 11 12 11 13 11    Perceived Dyspnea (Exercise) 1 1 1 1 1    Symptoms SOB, RPD = 1 SOB, RPD = 1 SOB, RPD = 1, lightheaded after exercise SOB, RPD = 1, lightheaded after exercise SOB, RPD = 1, lightheaded after exercise   Comments Reviewed METs and goals Reviewed home exercise Rx Reviewed METs Reviewed METs and goals Reviewed METs   Duration Continue with 30 min of aerobic exercise without signs/symptoms of physical distress. Continue with 30 min of aerobic exercise without signs/symptoms of physical distress. Continue with 30 min of aerobic exercise without signs/symptoms of physical distress. Continue with 30 min of aerobic exercise without signs/symptoms of physical distress. Continue with 30 min of aerobic exercise without signs/symptoms of physical distress.   Intensity THRR unchanged THRR unchanged THRR unchanged THRR unchanged THRR unchanged     Progression   Progression Continue to progress workloads to maintain intensity without signs/symptoms of physical distress. Continue to progress workloads to maintain intensity without signs/symptoms of physical distress. Continue to progress  workloads to maintain intensity without signs/symptoms of physical distress. Continue to progress workloads to maintain intensity without signs/symptoms of physical distress. Continue to progress workloads to maintain intensity without signs/symptoms of physical distress.   Average METs 2.4 2.6 2.7 2.75 2.6     Resistance Training   Training Prescription Yes No Yes Yes Yes   Weight 3 lb No weights on wednesdays 2 lbs 2 lbs 2 lbs   Reps 10-15 -- 10-15 10-15 10-15   Time 10 Minutes -- 10 Minutes 10 Minutes 5 Minutes     Interval Training   Interval Training No No No No No     Recumbant Bike   Level 1 2 2 2 2    RPM 77 69 66 79 19   Watts 21 22 18  -- 61   Minutes 15 15 15 15 15    METs 2.2 2.5 2.2 2.2 2.1     NuStep   Level 1 2 2 2 2    SPM 85 91 100 104 99   Minutes 15 15 15 15 15    METs 2.6 2.7 3.2 3.3 3     Home Exercise Plan   Plans to continue exercise at -- Home (comment) Home (comment) Home (comment) Home (comment)   Frequency -- Add 2 additional days to program exercise sessions. Add 2 additional days to program exercise sessions. Add 2 additional days to program exercise sessions. Add 2 additional days  to program exercise sessions.   Initial Home Exercises Provided -- 09/22/23 09/22/23 09/22/23 09/22/23    Row Name 11/17/23 1400 12/14/23 1300 12/28/23 1400 01/11/24 1500 01/25/24 1500     Response to Exercise   Blood Pressure (Admit) 128/62 112/64 106/58 124/80 126/66   Blood Pressure (Exercise) -- 108/64 154/62 130/76 160/70   Blood Pressure (Exit) 100/62 94/62 112/70 106/62 102/62   Heart Rate (Admit) 81 bpm 84 bpm 91 bpm 90 bpm 97 bpm   Heart Rate (Exercise) 103 bpm 105 bpm 114 bpm 104 bpm 119 bpm   Heart Rate (Exit) 89 bpm 96 bpm 94 bpm 91 bpm 94 bpm   Oxygen Saturation (Admit) 95 % 92 %  RA 93 %  RA 91 % 91 %   Oxygen Saturation (Exercise) 95 % 92 %  2L 92 %  2L 95 % 91 %   Oxygen Saturation (Exit) 93 % 92 % 92 %  RA 94 % 94 %   Rating of Perceived Exertion (Exercise)  12 12 13 13 13    Perceived Dyspnea (Exercise) 0 2 2 1 2    Symptoms None -- -- -- --   Comments Pt last day in program -- -- -- --   Duration Continue with 30 min of aerobic exercise without signs/symptoms of physical distress. Continue with 30 min of aerobic exercise without signs/symptoms of physical distress. Continue with 30 min of aerobic exercise without signs/symptoms of physical distress. Continue with 30 min of aerobic exercise without signs/symptoms of physical distress. Continue with 30 min of aerobic exercise without signs/symptoms of physical distress.   Intensity THRR unchanged THRR unchanged THRR unchanged THRR unchanged THRR unchanged     Progression   Progression Continue to progress workloads to maintain intensity without signs/symptoms of physical distress. Continue to progress workloads to maintain intensity without signs/symptoms of physical distress. Continue to progress workloads to maintain intensity without signs/symptoms of physical distress. Continue to progress workloads to maintain intensity without signs/symptoms of physical distress. Continue to progress workloads to maintain intensity without signs/symptoms of physical distress.   Average METs 2.5 -- -- -- --     Resistance Training   Training Prescription No Yes Yes Yes Yes   Weight No weights on Wed blue bands blue bands blue bands blue bands   Reps -- 10-15 10-15 10-15 10-15   Time -- 10 Minutes 10 Minutes 10 Minutes 10 Minutes     Interval Training   Interval Training No No -- -- --     Oxygen   Oxygen Continuous Continuous Continuous Continuous Continuous   Liters 3 0-2 2 1-2 1-2     Bike   Level -- 3 3 4  3.3   Minutes -- 15 15 15 15    METs -- 3.8 4.1 4.7 4.6     Recumbant Bike   Level 2 -- -- -- --   RPM 70 -- -- -- --   Watts 15 -- -- -- --   Minutes 15 -- -- -- --   METs 2 -- -- -- --     NuStep   Level 3 -- -- -- --   SPM 97 -- -- -- --   Minutes 15 -- -- -- --   METs 3 -- -- -- --      Track   Laps -- 11 11 14 12    Minutes -- 15 15 15 15    METs -- 2.37 2.37 2.7 2.5     Oxygen   Maintain Oxygen  Saturation -- 88% or higher 88% or higher 88% or higher 88% or higher    Row Name 02/08/24 1400 02/22/24 1400           Response to Exercise   Blood Pressure (Admit) 112/62 144/80      Blood Pressure (Exercise) 142/70 152/72      Blood Pressure (Exit) 96/60 122/70      Heart Rate (Admit) 95 bpm 97 bpm      Heart Rate (Exercise) 119 bpm 106 bpm      Heart Rate (Exit) 96 bpm 101 bpm      Oxygen Saturation (Admit) 93 % 93 %      Oxygen Saturation (Exercise) 95 % 97 %      Oxygen Saturation (Exit) 95 % 94 %      Rating of Perceived Exertion (Exercise) 11 11      Perceived Dyspnea (Exercise) 1 1      Duration Continue with 30 min of aerobic exercise without signs/symptoms of physical distress. Continue with 30 min of aerobic exercise without signs/symptoms of physical distress.      Intensity THRR unchanged THRR unchanged        Progression   Progression Continue to progress workloads to maintain intensity without signs/symptoms of physical distress. Continue to progress workloads to maintain intensity without signs/symptoms of physical distress.        Resistance Training   Training Prescription Yes Yes      Weight blue bands blue bands      Reps 10-15 10-15      Time 10 Minutes 10 Minutes        Interval Training   Interval Training No --        Oxygen   Oxygen Continuous Continuous      Liters 2 2        Bike   Level 3.5 3.5      Watts -- 36      Minutes 15 15      METs 5.1 4.1        Track   Laps 16 8      Minutes 15 --      METs 3 2.2        Home Exercise Plan   Plans to continue exercise at -- [employer] (comment)      Frequency -- Add 1 additional day to program exercise sessions.      Initial Home Exercises Provided -- 02/22/24        Oxygen   Maintain Oxygen Saturation 88% or higher 88% or higher         Exercise Comments:    Exercise Comments     Row Name 09/13/23 1500 09/22/23 1431 09/27/23 1500 10/25/23 1706 11/17/23 1432   Exercise Comments Reviewed METs and goals. Slow progress. Will encourage increase in exercise workloads. Reviewed home exercise Rx. Pt would like to do pool exercise but pools are not open yet. Pt given some links for chair exercise that she can do at home. Pt verbalized understanding of the HERx and was provided a copy. Reviewed METs. Slow progression. Pt lightheaded after exercise. Had not eaten much today. H2) encouraged, given graham crarkers and PNB. Felt fine on D/C. Reviewed METs. Pt feeling good about her appt with the pulmonologist today. Slow progression. Pt last day in CR, pt plans to go into PR to further streghthen her lungs and increase her stamina. Post QOL scores and knowledge scores could not assessed as she  did not bring in her paperwork.    Row Name 12/07/23 1459 02/22/24 1532         Exercise Comments Pt completed first day of PR. She exercised for 15 min on the track and bike. Madison West averages 2.3 METs on the track and 2.5 METs at level 2 on the bike. Madison West performed the warmup and cooldown standing without limitations. Will discuss METs later. Discussed with pt home exercise plan. She is not currently exercising. Discussed option to join [employer] or [facility] (she could do Pulmonary Wellness program there). She wants to look in to it. Encouraged walking or the aerobic machines like the scifit bike she is doing in [place]. Encouraged 30 min of exercise, 3-5 days a week.         Exercise Goals and Review:   Exercise Goals     Row Name 12/01/23 1313             Exercise Goals   Increase Physical Activity Yes       Intervention Provide advice, education, support and counseling about physical activity/exercise needs.;Develop an individualized exercise prescription for aerobic and resistive training based on initial evaluation findings, risk stratification, comorbidities and participant's  personal goals.       Expected Outcomes Short Term: Attend rehab on a regular basis to increase amount of physical activity.;Long Term: Exercising regularly at least 3-5 days a week.;Long Term: Add in home exercise to make exercise part of routine and to increase amount of physical activity.       Increase Strength and Stamina Yes       Intervention Develop an individualized exercise prescription for aerobic and resistive training based on initial evaluation findings, risk stratification, comorbidities and participant's personal goals.;Provide advice, education, support and counseling about physical activity/exercise needs.       Expected Outcomes Short Term: Increase workloads from initial exercise prescription for resistance, speed, and METs.;Short Term: Perform resistance training exercises routinely during rehab and add in resistance training at home;Long Term: Improve cardiorespiratory fitness, muscular endurance and strength as measured by increased METs and functional capacity ( )       Able to understand and use rate of perceived exertion (RPE) scale Yes       Intervention Provide education and explanation on how to use RPE scale       Expected Outcomes Short Term: Able to use RPE daily in rehab to express subjective intensity level;Long Term:  Able to use RPE to guide intensity level when exercising independently       Able to understand and use Dyspnea scale Yes       Intervention Provide education and explanation on how to use Dyspnea scale       Expected Outcomes Short Term: Able to use Dyspnea scale daily in rehab to express subjective sense of shortness of breath during exertion;Long Term: Able to use Dyspnea scale to guide intensity level when exercising independently       Knowledge and understanding of Target Heart Rate Range (THRR) Yes       Intervention Provide education and explanation of THRR including how the numbers were predicted and where they are located for reference        Expected Outcomes Short Term: Able to state/look up THRR;Short Term: Able to use daily as guideline for intensity in rehab;Long Term: Able to use THRR to govern intensity when exercising independently       Understanding of Exercise Prescription Yes       Intervention  Provide education, explanation, and written materials on patient's individual exercise prescription       Expected Outcomes Short Term: Able to explain program exercise prescription;Long Term: Able to explain home exercise prescription to exercise independently          Exercise Goals Re-Evaluation :  Exercise Goals Re-Evaluation     Row Name 09/13/23 1500 12/21/23 0919 01/20/24 0736 02/15/24 0912       Exercise Goal Re-Evaluation   Exercise Goals Review Increase Physical Activity;Increase Strength and Stamina;Able to understand and use rate of perceived exertion (RPE) scale;Knowledge and understanding of Target Heart Rate Range (THRR);Understanding of Exercise Prescription Increase Physical Activity;Increase Strength and Stamina;Able to understand and use rate of perceived exertion (RPE) scale;Knowledge and understanding of Target Heart Rate Range (THRR);Understanding of Exercise Prescription;Able to understand and use Dyspnea scale Increase Physical Activity;Increase Strength and Stamina;Able to understand and use rate of perceived exertion (RPE) scale;Knowledge and understanding of Target Heart Rate Range (THRR);Understanding of Exercise Prescription;Able to understand and use Dyspnea scale Increase Physical Activity;Increase Strength and Stamina;Able to understand and use rate of perceived exertion (RPE) scale;Knowledge and understanding of Target Heart Rate Range (THRR);Understanding of Exercise Prescription;Able to understand and use Dyspnea scale    Comments Reviewed METs and goals. Pt voices progress on her goals of increased strength, stamina and energy. Pt has goal of weight loss and has lost 5.6 kg to date. Pt has completed 2  exercise sessions in [place], missing 2 sessions due to fatigue. She is exercising for 15 min on the track and bike. Madison West averages 2.37 METs on the track and 3.8 METs at level 3 on the bike. Madison West performs the warmup and cooldown standing without limitations, using blue bands, 5.8 lbs. Will progress as tolerated. Pt has completed 10 exercise sessions in [place]. She is exercising for 15 min on the track and bike. Madison West averages 2.5 METs on the track and 4.5 METs at level 3.5 on the bike. Madison West performs the warmup and cooldown standing without limitations, using blue bands, 5.8 lbs, but moving to black next session. She is progressing well.  Will progress as tolerated. Pt has completed 16 exercise sessions in [place]. She is exercising for 15 min on the track and bike. Madison West averages 2.85 METs on the track and 4.5 METs at level 3.5 on the bike. Madison West performs the warmup and cooldown standing without limitations, using black bands, 7.3 lbs. She is progressing well.  Will progress as tolerated., she is scheduled to graduate 9/9    Expected Outcomes Will continue to monitor patient and progress exercise workloads as tolerated. Will continue to monitor patient and progress exercise workloads as tolerated. Will continue to monitor patient and progress exercise workloads as tolerated. Through exercise at rehab and home, the patient will decrease shortness of breath with daily activities and feel confident in carrying out an exercise regimen at home.       Discharge Exercise Prescription (Final Exercise Prescription Changes):  Exercise Prescription Changes - 02/22/24 1400       Response to Exercise   Blood Pressure (Admit) 144/80    Blood Pressure (Exercise) 152/72    Blood Pressure (Exit) 122/70    Heart Rate (Admit) 97 bpm    Heart Rate (Exercise) 106 bpm    Heart Rate (Exit) 101 bpm    Oxygen Saturation (Admit) 93 %    Oxygen Saturation (Exercise) 97 %    Oxygen Saturation (Exit) 94 %    Rating of Perceived Exertion (Exercise)  11    Perceived Dyspnea (Exercise) 1    Duration Continue with 30 min of aerobic exercise without signs/symptoms of physical distress.    Intensity THRR unchanged      Progression   Progression Continue to progress workloads to maintain intensity without signs/symptoms of physical distress.      Resistance Training   Training Prescription Yes    Weight blue bands    Reps 10-15    Time 10 Minutes      Oxygen   Oxygen Continuous    Liters 2      Bike   Level 3.5    Watts 36    Minutes 15    METs 4.1      Track   Laps 8    METs 2.2      Home Exercise Plan   Plans to continue exercise at [employer] (comment)    Frequency Add 1 additional day to program exercise sessions.    Initial Home Exercises Provided 02/22/24      Oxygen   Maintain Oxygen Saturation 88% or higher          Nutrition:  Target Goals: Understanding of nutrition guidelines, daily intake of sodium 1500mg , cholesterol 200mg , calories 30% from fat and 7% or less from saturated fats, daily to have 5 or more servings of fruits and vegetables.  Biometrics:  Pre Biometrics - 12/01/23 1442       Pre Biometrics   Grip Strength 21 kg          Post Biometrics - 11/01/23 1704        Post  Biometrics   Height 5' 8 (1.727 m)    Weight 89.7 kg    Waist Circumference 47 inches    Hip Circumference 51 inches    Waist to Hip Ratio 0.92 %    BMI (Calculated) 30.08    Triceps Skinfold 14 mm    % Body Fat 40.4 %    Grip Strength 20 kg    Flexibility 22 in    Single Leg Stand 18.57 seconds          Nutrition Therapy Plan and Nutrition Goals:  Nutrition Therapy & Goals - 02/10/24 1408       Nutrition Therapy   Diet Heart Healthy Diet    Drug/Food Interactions Statins/Certain Fruits      Personal Nutrition Goals   Nutrition Goal Patient to improve diet quality by using the plate method as a guide for meal planning to include lean protein/plant protein, fruits, vegetables, whole grains,  nonfat dairy as part of a well-balanced diet.   goal in progress.   Personal Goal #2 Patient to identify strategies for weight loss of 0.5-2.0# per week.   goal not met.   Comments Goals in progress. Madison West has medical history of hyperlipidemia, s/p TAVR, STEMI, CHF, HTN, CAD, carotid artery stenosis. She recently completed intensive cardiac rehab. She is now starting pulmonary rehab for DOE, hemoptysis. She is motivated to lose weight to 170#; she lost about 6.6# throughout cardiac rehab but is up 1.5# since starting pulmonary rehab. LDL has improved but not at goal; multiple documentation of refusing statin but patient is currently prescribed 20mg  crestor . Patient will benefit from attendance to pulmonary rehab and  adherence to nutrition, exercise, and lifestyle modification.      Intervention Plan   Intervention Prescribe, educate and counsel regarding individualized specific dietary modifications aiming towards targeted core components such as weight, hypertension, lipid management, diabetes, heart  failure and other comorbidities.;Nutrition handout(s) given to patient.    Expected Outcomes Short Term Goal: Understand basic principles of dietary content, such as calories, fat, sodium, cholesterol and nutrients.;Long Term Goal: Adherence to prescribed nutrition plan.          Nutrition Assessments:  Nutrition Assessments - 12/21/23 1515       Rate Your Plate Scores   Pre Score 67         MEDIFICTS Score Key: >=70 Need to make dietary changes  40-70 Heart Healthy Diet <= 40 Therapeutic Level Cholesterol Diet  Flowsheet Row PULMONARY REHAB OTHER RESPIRATORY from 12/21/2023 in [facility]  Picture Your Plate Total Score on Admission 67   Picture Your Plate Scores: <59 Unhealthy dietary pattern with much room for improvement. 41-50 Dietary pattern unlikely to meet recommendations for good health and room for improvement. 51-60 More  healthful dietary pattern, with some room for improvement.  >60 Healthy dietary pattern, although there may be some specific behaviors that could be improved.    Nutrition Goals Re-Evaluation:  Nutrition Goals Re-Evaluation     Row Name 09/24/23 1040 10/27/23 0929 11/17/23 1536 12/07/23 1549 01/11/24 1504     Goals   Current Weight 199 lb 1.2 oz (90.3 kg) 196 lb 3.4 oz (89 kg) 196 lb 3.4 oz (89 kg) 194 lb 10.7 oz (88.3 kg) 194 lb 0.1 oz (88 kg)   Comment LDL 71, HDL WNL, Lpa 227 no new labs; most recent labs  LDL 71, HDL WNL, Lpa 227 most recent labs LDL 71, HDL WNL, Lpa 227 most recent labs LDL 71, HDL WNL, Lpa 227 LDL 82, BNP 588   Expected Outcome Goals in progress. Madison West has medical history of hyperlipidemia, s/p TAVR, STEMI, CHF, HTN, CAD, carotid artery stenosis. She continues to attend the [employer] education and nutrition series regularly. She is motivated to lose weight to 170#; she is down 3.7# since starting with our program. LDL has improved to 71; she continues zetia , crestor  and heart healthy diet modifications. Patient will benefit from participation in intensive cardiac rehab for nutrition, exercise, and lifestyle modification.90.3kg Goals in progress. Madison West has medical history of hyperlipidemia, s/p TAVR, STEMI, CHF, HTN, CAD, carotid artery stenosis. She continues to attend the [employer] education and nutrition series regularly. She is motivated to lose weight to 170#; she is down 6.6# since starting with our program. LDL has improved to 71; she continues zetia , crestor  and heart healthy diet modifications. She continues follow-up with pulmonolgy regarding hemoptysis. Patient will benefit from participation in intensive cardiac rehab for nutrition, exercise, and lifestyle modification. Goals in progress. Madison West has medical history of hyperlipidemia, s/p TAVR, STEMI, CHF, HTN, CAD, carotid artery stenosis. She has attended the [employer] education and nutrition series regularly. She is motivated to  lose weight to 170#; she is down 6.6# since starting with our program. LDL has improved to 71; she continues zetia , crestor  and heart healthy diet modifications. She continues follow-up with pulmonolgy regarding hemoptysis and plans to start pulmonary rehab. Patient will benefit from adherence to nutrition, exercise, and lifestyle modification. Goals in progress. Madison West has medical history of hyperlipidemia, s/p TAVR, STEMI, CHF, HTN, CAD, carotid artery stenosis. She recently completed intensive cardiac rehab. She is now starting pulmonary rehab for DOE, hemoptysis. She is motivated to lose weight to 170#; she lost about 6.6# throughout cardiac rehab. LDL has improved to 71; she continues zetia , crestor  and heart healthy diet modifications. Patient  will benefit from attendance to pulmonary rehab and adherence to nutrition, exercise, and lifestyle modification. Goals in progress. Madison West has medical history of hyperlipidemia, s/p TAVR, STEMI, CHF, HTN, CAD, carotid artery stenosis. She recently completed intensive cardiac rehab. She is now starting pulmonary rehab for DOE, hemoptysis. She is motivated to lose weight to 170#; she lost about 6.6# throughout cardiac rehab and has maintained her weight since starting pulmonary rehab. LDL has improved; she continues zetia , crestor  and heart healthy diet modifications. Patient will benefit from attendance to pulmonary rehab and adherence to nutrition, exercise, and lifestyle modification.    Row Name 02/10/24 1408             Goals   Current Weight 196 lb 3.4 oz (89 kg)       Comment LDL 82, BNP 588, LPa 227       Expected Outcome Goals in progress. Madison West has medical history of hyperlipidemia, s/p TAVR, STEMI, CHF, HTN, CAD, carotid artery stenosis. She recently completed intensive cardiac rehab. She is now starting pulmonary rehab for DOE, hemoptysis. She is motivated to lose weight to 170#; she lost about 6.6# throughout cardiac rehab but is up 1.5# since starting  pulmonary rehab. LDL has improved but not at goal; multiple documentation of refusing statin but patient is currently prescribed 20mg  crestor . Patient will benefit from attendance to pulmonary rehab and adherence to nutrition, exercise, and lifestyle modification.          Nutrition Goals Discharge (Final Nutrition Goals Re-Evaluation):  Nutrition Goals Re-Evaluation - 02/10/24 1408       Goals   Current Weight 196 lb 3.4 oz (89 kg)    Comment LDL 82, BNP 588, LPa 227    Expected Outcome Goals in progress. Madison West has medical history of hyperlipidemia, s/p TAVR, STEMI, CHF, HTN, CAD, carotid artery stenosis. She recently completed intensive cardiac rehab. She is now starting pulmonary rehab for DOE, hemoptysis. She is motivated to lose weight to 170#; she lost about 6.6# throughout cardiac rehab but is up 1.5# since starting pulmonary rehab. LDL has improved but not at goal; multiple documentation of refusing statin but patient is currently prescribed 20mg  crestor . Patient will benefit from attendance to pulmonary rehab and adherence to nutrition, exercise, and lifestyle modification.          Psychosocial: Target Goals: Acknowledge presence or absence of significant depression and/or stress, maximize coping skills, provide positive support system. Participant is able to verbalize types and ability to use techniques and skills needed for reducing stress and depression.  Initial Review & Psychosocial Screening:  Initial Psych Review & Screening - 12/01/23 1316       Initial Review   Current issues with Current Anxiety/Panic;Current Psychotropic Meds;Current Stress Concerns    Source of Stress Concerns Chronic Illness;Family    Comments Madison West stated she is concerned about her health declining, has stress related to her family      Family Dynamics   Good Support System? Yes    Comments Pt has daughters as support      Barriers   Psychosocial barriers to participate in program The patient  should benefit from training in stress management and relaxation.      Screening Interventions   Interventions Encouraged to exercise;Provide feedback about the scores to participant    Expected Outcomes Short Term goal: Utilizing psychosocial counselor, staff and physician to assist with identification of specific Stressors or current issues interfering with healing process. Setting desired goal for each stressor or  current issue identified.;Long Term Goal: Stressors or current issues are controlled or eliminated.;Short Term goal: Identification and review with participant of any Quality of Life or Depression concerns found by scoring the questionnaire.;Long Term goal: The participant improves quality of Life and PHQ9 Scores as seen by post scores and/or verbalization of changes          Quality of Life Scores:  Quality of Life - 12/23/23 1541       Quality of Life   Select --   Pt returned paperwork 12/21/23     Quality of Life Scores   Health/Function Pre 20 %    Health/Function Post 24.86 %    Health/Function % Change 24.3 %    Socioeconomic Pre 26.57 %    Socioeconomic Post 29 %    Socioeconomic % Change  9.15 %    Psych/Spiritual Pre 18.66 %    Psych/Spiritual Post 25.71 %    Psych/Spiritual % Change 37.78 %    Family Pre 18 %    Family Post 25.5 %    Family % Change 41.67 %    GLOBAL Pre 21.1 %    GLOBAL Post 25.94 %    GLOBAL % Change 22.94 %         Scores of 19 and below usually indicate a poorer quality of life in these areas.  A difference of  2-3 points is a clinically meaningful difference.  A difference of 2-3 points in the total score of the Quality of Life Index has been associated with significant improvement in overall quality of life, self-image, physical symptoms, and general health in studies assessing change in quality of life.  PHQ-9: Review Flowsheet  More data exists      12/01/2023 11/17/2023 08/02/2023 07/26/2023 07/05/2023  Depression screen PHQ 2/9   Decreased Interest 0 0 0 1 0  Down, Depressed, Hopeless 0 0 0 0 0  PHQ - 2 Score 0 0 0 1 0  Altered sleeping 1 0 - 2 -  Tired, decreased energy 1 1 - 3 -  Change in appetite 1 0 - 1 -  Feeling bad or failure about yourself  0 0 - 0 -  Trouble concentrating 0 0 - 0 -  Moving slowly or fidgety/restless 0 0 - 0 -  Suicidal thoughts 0 0 - 0 -  PHQ-9 Score 3 1 - 7 -  Difficult doing work/chores Not difficult at all Not difficult at all - Not difficult at all -   Interpretation of Total Score  Total Score Depression Severity:  1-4 = Minimal depression, 5-9 = Mild depression, 10-14 = Moderate depression, 15-19 = Moderately severe depression, 20-27 = Severe depression   Psychosocial Evaluation and Intervention:  Psychosocial Evaluation - 12/01/23 1318       Psychosocial Evaluation & Interventions   Interventions Encouraged to exercise with the program and follow exercise prescription    Comments Madison West has an extensive mental health history. She has been under the care of a psychiatrist and pain management MD since she was in her 48s. She continues to see both doctors' regularly. Madison West stated she is concerned about her health declining and has stress related to her family. She declines any additional needs, resources, or referrals currently.    Expected Outcomes For Madison West to participate in [facility] free of any barriers or concerns    Continue Psychosocial Services  No Follow up required          Psychosocial Re-Evaluation:  Psychosocial  Re-Evaluation     Row Name 09/21/23 [phone] 10/12/23 0905 11/09/23 1602 12/28/23 1229 01/19/24 1516     Psychosocial Re-Evaluation   Current issues with Current Stress Concerns;Current Anxiety/Panic Current Stress Concerns;Current Anxiety/Panic Current Stress Concerns;Current Anxiety/Panic Current Stress Concerns;Current Psychotropic Meds;Current Anxiety/Panic;History of Depression;Current Depression Current Stress Concerns;Current Psychotropic Meds;Current  Anxiety/Panic;History of Depression;Current Depression   Comments Madison West continues to voice having family stress with her live in children. Emotional support provided Madison West continues to voice having family stress with her live in children. Emotional support provided. Madison West would benifit from couseling. Madison West says she is not interested at this time. Madison West continues to voice concerns about her  upcoming procedure on 11/17/23 due to hemoptysis. Monthly psy/soc reevaluation: Madison West has an extensive mental health history. She has been under the care of a psychiatrist and pain management MD since she was in her 86s. She continues to see both doctors' regularly. Madison West stated she is concerned about her health declining and has stress related to her family. She declines any additional needs, resources, or referrals currently. Monthly psy/soc reevaluation: Madison West has an extensive mental health history. She has been under the care of a psychiatrist and pain management MD since she was in her 80s. She continues to see both doctors' regularly. Madison West was relieved to find out her bronchoscopy results were negative. She stated that, this is a weight lifted off of me. She declines any additional needs, resources, or referrals currently.   Expected Outcomes Madison West will have decreased or controlled anxiety/ stressors upon completion of cardiac rehab Madison West will have decreased or controlled anxiety/ stressors upon completion of cardiac rehab Madison West will have decreased or controlled anxiety/ stressors upon completion of cardiac rehab Madison West will have decreased or controlled anxiety/stressors. Madison West will to continue to exercise for stress relief and to have a positive outlook with good coping skills to manage her stress and any other psychosocial issues that may arise. Madison West will have decreased or controlled anxiety/stressors. Madison West will to continue to exercise for stress relief and to have a positive outlook with good coping skills to manage her stress and any other  psychosocial issues that may arise.   Interventions Stress management education;Relaxation education;Encouraged to attend Cardiac Rehabilitation for the exercise Stress management education;Relaxation education;Encouraged to attend Cardiac Rehabilitation for the exercise Stress management education;Relaxation education;Encouraged to attend Cardiac Rehabilitation for the exercise -- --   Continue Psychosocial Services  Follow up required by staff Follow up required by staff Follow up required by staff No Follow up required No Follow up required     Initial Review   Source of Stress Concerns Chronic Illness;Family Chronic Illness;Family Chronic Illness;Family -- --   Comments Will continue to monitor and offer support as needed. Will continue to monitor and offer support as needed. Will continue to monitor and offer support as needed. -- --    Row Name 02/18/24 1049             Psychosocial Re-Evaluation   Current issues with Current Stress Concerns;Current Psychotropic Meds;Current Anxiety/Panic;History of Depression;Current Depression       Comments Monthly psychosocial re-evaluation is as follows: Madison West has an extensive mental health history. She has been under the care of a psychiatrist and pain management MD since she was in her 60s. She continues to see both doctors' regularly. Madison West has had some added stress due to a repeat echo that has shown no improvement in her EF. Cardiologist has recommended an ICD. Madison West is also non-compliant in taking a  statin. Lipid profile numbers are severely out of normal range, but she declines due to possible side effects. Madison West stated she would ask her MD about Repatha. Added stress also comes from her son-in-law who is coming to live with her and her daughter. He was deployed but is coming back home. She is not happy with this but wants to live with her daughter for support. At this time, she declines any additional needs, resources, or referrals currently.        Expected Outcomes Madison West will have decreased or controlled anxiety/stressors. Madison West will to continue to exercise for stress relief and to have a positive outlook with good coping skills to manage her stress and any other psychosocial issues that may arise.       Interventions Encouraged to attend Pulmonary Rehabilitation for the exercise       Continue Psychosocial Services  Follow up required by staff          Psychosocial Discharge (Final Psychosocial Re-Evaluation):  Psychosocial Re-Evaluation - 02/18/24 1049       Psychosocial Re-Evaluation   Current issues with Current Stress Concerns;Current Psychotropic Meds;Current Anxiety/Panic;History of Depression;Current Depression    Comments Monthly psychosocial re-evaluation is as follows: Madison West has an extensive mental health history. She has been under the care of a psychiatrist and pain management MD since she was in her 79s. She continues to see both doctors' regularly. Madison West has had some added stress due to a repeat echo that has shown no improvement in her EF. Cardiologist has recommended an ICD. Madison West is also non-compliant in taking a statin. Lipid profile numbers are severely out of normal range, but she declines due to possible side effects. Madison West stated she would ask her MD about Repatha. Added stress also comes from her son-in-law who is coming to live with her and her daughter. He was deployed but is coming back home. She is not happy with this but wants to live with her daughter for support. At this time, she declines any additional needs, resources, or referrals currently.    Expected Outcomes Madison West will have decreased or controlled anxiety/stressors. Madison West will to continue to exercise for stress relief and to have a positive outlook with good coping skills to manage her stress and any other psychosocial issues that may arise.    Interventions Encouraged to attend Pulmonary Rehabilitation for the exercise    Continue Psychosocial Services  Follow up  required by staff          Education: Education Goals: Education classes will be provided on a weekly basis, covering required topics. Participant will state understanding/return demonstration of topics presented.  Learning Barriers/Preferences:  Learning Barriers/Preferences - 12/01/23 1319       Learning Barriers/Preferences   Learning Barriers None    Learning Preferences Group Instruction;Individual Instruction;Verbal Instruction;Written Material          Education Topics: Know Your Numbers Group instruction that is supported by a PowerPoint presentation. Instructor discusses importance of knowing and understanding resting, exercise, and post-exercise oxygen saturation, heart rate, and blood pressure. Oxygen saturation, heart rate, blood pressure, rating of perceived exertion, and dyspnea are reviewed along with a normal range for these values.    Exercise for the Pulmonary Patient Group instruction that is supported by a PowerPoint presentation. Instructor discusses benefits of exercise, core components of exercise, frequency, duration, and intensity of an exercise routine, importance of utilizing pulse oximetry during exercise, safety while exercising, and options of places to exercise outside of rehab.  MET Level  Group instruction provided by PowerPoint, verbal discussion, and written material to support subject matter. Instructor reviews what METs are and how to increase METs.  Flowsheet Row PULMONARY REHAB OTHER RESPIRATORY from 02/03/2024 in [facility]  Date 02/03/24  Educator EP  Instruction Review Code 1- Verbalizes Understanding    Pulmonary Medications Verbally interactive group education provided by instructor with focus on inhaled medications and proper administration.   Anatomy and Physiology of the Respiratory System Group instruction provided by PowerPoint, verbal discussion, and written  material to support subject matter. Instructor reviews respiratory cycle and anatomical components of the respiratory system and their functions. Instructor also reviews differences in obstructive and restrictive respiratory diseases with examples of each.  Flowsheet Row PULMONARY REHAB OTHER RESPIRATORY from 02/17/2024 in [facility]  Date 02/17/24  Educator RT  Instruction Review Code 1- Verbalizes Understanding    Oxygen Safety Group instruction provided by PowerPoint, verbal discussion, and written material to support subject matter. There is an overview of "What is Oxygen" and "Why do we need it".  Instructor also reviews how to create a safe environment for oxygen use, the importance of using oxygen as prescribed, and the risks of noncompliance. There is a brief discussion on traveling with oxygen and resources the patient may utilize.   Oxygen Use Group instruction provided by PowerPoint, verbal discussion, and written material to discuss how supplemental oxygen is prescribed and different types of oxygen supply systems. Resources for more information are provided.    Breathing Techniques Group instruction that is supported by demonstration and informational handouts. Instructor discusses the benefits of pursed lip and diaphragmatic breathing and detailed demonstration on how to perform both.  Flowsheet Row PULMONARY REHAB OTHER RESPIRATORY from 01/06/2024 in [facility] for [facility], V[facility], [facility] Lung [facility]  Date 01/06/24  Educator RN  Instruction Review Code 1- Verbalizes Understanding     Risk Factor Reduction Group instruction that is supported by a PowerPoint presentation. Instructor discusses the definition of a risk factor, different risk factors for pulmonary disease, and how the heart and lungs work together.   Pulmonary Diseases Group instruction provided by PowerPoint, verbal discussion, and  written material to support subject matter. Instructor gives an overview of the different type of pulmonary diseases. There is also a discussion on risk factors and symptoms as well as ways to manage the diseases. Flowsheet Row PULMONARY REHAB OTHER RESPIRATORY from 02/10/2024 in [facility], Vascular, & Lung Health  Date 02/10/24  Educator RT  Instruction Review Code 1- Verbalizes Understanding    Stress and Energy Conservation Group instruction provided by PowerPoint, verbal discussion, and written material to support subject matter. Instructor gives an overview of stress and the impact it can have on the body. Instructor also reviews ways to reduce stress. There is also a discussion on energy conservation and ways to conserve energy throughout the day. Flowsheet Row PULMONARY REHAB OTHER RESPIRATORY from 01/13/2024 in [facility], [facility]  Date 01/13/24  Educator RN  Instruction Review Code 1- Verbalizes Understanding    Warning Signs and Symptoms Group instruction provided by PowerPoint, verbal discussion, and written material to support subject matter. Instructor reviews warning signs and symptoms of stroke, heart attack, cold and flu. Instructor also reviews ways to prevent the spread of infection.   Other Education  Group or individual verbal, written, or video instructions that support the educational goals of the pulmonary rehab program.    Knowledge Questionnaire Score:  Knowledge Questionnaire Score - 12/23/23 1537       Knowledge Questionnaire Score   Post Score 21/24   Pt returned paperwork 12/21/23         Core Components/Risk Factors/Patient Goals at Admission:  Personal Goals and Risk Factors at Admission - 12/01/23 1344       Core Components/Risk Factors/Patient Goals on Admission    Weight Management Yes;Obesity;Weight Loss    Intervention Weight Management: Develop a combined  nutrition and exercise program designed to reach desired caloric intake, while maintaining appropriate intake of nutrient and fiber, sodium and fats, and appropriate energy expenditure required for the weight goal.;Weight Management: Provide education and appropriate resources to help participant work on and attain dietary goals.;Weight Management/Obesity: Establish reasonable short term and long term weight goals.;Obesity: Provide education and appropriate resources to help participant work on and attain dietary goals.    Admit Weight 194 lb 9.6 oz (88.3 kg)    Goal Weight: Short Term 184 lb (83.5 kg)    Goal Weight: Long Term 170 lb (77.1 kg)   pt goal   Expected Outcomes Short Term: Continue to assess and modify interventions until short term weight is achieved;Long Term: Adherence to nutrition and physical activity/exercise program aimed toward attainment of established weight goal;Weight Loss: Understanding of general recommendations for a balanced deficit meal plan, which promotes 1-2 lb weight loss per week and includes a negative energy balance of [phone] kcal/d;Understanding recommendations for meals to include 15-35% energy as protein, 25-35% energy from fat, 35-60% energy from carbohydrates, less than 200mg  of dietary cholesterol, 20-35 gm of total fiber daily;Understanding of distribution of calorie intake throughout the day with the consumption of 4-5 meals/snacks    Tobacco Cessation Yes    Number of packs per day pt states smokes occassionally with dgt    Intervention Assist the participant in steps to quit. Provide individualized education and counseling about committing to Tobacco Cessation, relapse prevention, and pharmacological support that can be provided by physician.;Education officer, environmental, assist with locating and accessing local/national Quit Smoking programs, and support quit date choice.    Expected Outcomes Short Term: Will demonstrate readiness to quit, by selecting a  quit date.;Long Term: Complete abstinence from all tobacco products for at least 12 months from quit date.;Short Term: Will quit all tobacco product use, adhering to prevention of relapse plan.    Improve shortness of breath with ADL's Yes    Intervention Provide education, individualized exercise plan and daily activity instruction to help decrease symptoms of SOB with activities of daily living.    Expected Outcomes Short Term: Improve cardiorespiratory fitness to achieve a reduction of symptoms when performing ADLs;Long Term: Be able to perform more ADLs without symptoms or delay the onset of symptoms    Stress Yes    Intervention Offer individual and/or small group education and counseling on adjustment to heart disease, stress management and health-related lifestyle change. Teach and support self-help strategies.;Refer participants experiencing significant psychosocial distress to appropriate mental health specialists for further evaluation and treatment. When possible, include family members and significant others in education/counseling sessions.    Expected Outcomes Short Term: Participant demonstrates changes in health-related behavior, relaxation and other stress management skills, ability to obtain effective social support, and compliance with psychotropic medications if prescribed.;Long Term: Emotional wellbeing is indicated by absence of clinically significant psychosocial distress  or social isolation.          Core Components/Risk Factors/Patient Goals Review:   Goals and Risk Factor Review     Row Name 09/21/23 9041 10/12/23 0908 11/09/23 1605 12/28/23 1232 01/19/24 1518     Core Components/Risk Factors/Patient Goals Review   Personal Goals Review Weight Management/Obesity;Heart Failure;Lipids;Hypertension;Stress;Improve shortness of breath with ADL's Weight Management/Obesity;Heart Failure;Lipids;Hypertension;Stress;Improve shortness of breath with ADL's Weight  Management/Obesity;Heart Failure;Lipids;Hypertension;Stress;Improve shortness of breath with ADL's Weight Management/Obesity;Stress;Improve shortness of breath with ADL's;Develop more efficient breathing techniques such as purse lipped breathing and diaphragmatic breathing and practicing self-pacing with activity. Weight Management/Obesity;Stress;Improve shortness of breath with ADL's;Develop more efficient breathing techniques such as purse lipped breathing and diaphragmatic breathing and practicing self-pacing with activity.   Review Madison West is doing well with exercise at  cardiac rehab. Vital signs and oxygen saturations have been stable now that Madison West is wearing oxygen with exercise. Oxygen saturations have remained in the low to mid 90's on 2l/min. Madison West would benefit from participating in pulmonary rehab when she completes cardiac rehab. Madison West says she is more interested in water aerobics. Madison West has lost 1.7 kg since starting cardiac rehab. Madison West is doing well with exercise at  cardiac rehab. Vital signs have been stable.  Oxygen saturations have remained in the upper 80's to  mid 90's on 2l/min. Madison West would benefit from participating in pulmonary rehab when she completes cardiac rehab. Madison West says she is more interested in water aerobics. Madison West has lost 2.0 kg since starting cardiac rehab. Will notify Dr Elmira about oxygen saturations. Madison West is doing well with exercise at  cardiac rehab. Vital signs have been stable.  Oxygen saturations have remained in the upper 80's to  mid 90's on 2l/min. Madison West would benefit from participating in pulmonary rehab when she completes cardiac rehab. Madison West has lost 4 kg since starting cardiac rehab. Madison West will complete cardiac rehab on 11/17/23 Monthly review of patient's Core Components/Risk Factors/Patient Goals are as follows: Goal in progress for improving her shortness of breath with ADLs. Madison West has been stable on 2L [place]. She has completed 3 sessions so far. Goal progressing on  developing more efficient breathing techniques such as purse lipped breathing and diaphragmatic breathing; and practicing self-pacing with activity. She needs to be prompted to initiate pursed lip breathing. She has begun practicing diaphragmatic breathing before bed. She can self-pace herself when needed and allows herself to take breaks while walking or exercising. Goal not met for smoking cessation. Madison West has not set a plan. She doesn't have a desire to quit smoking yet. She has been provided resources. Goal progressing for weight loss. Madison West is currently down #1.3# since starting the program. She is working with our dietitian to decrease calories. Madison West will continue to benefit from PR for nutrition, education, exercise, and lifestyle modification. Monthly review of patient's Core Components/Risk Factors/Patient Goals are as follows: Goal in progress for improving her shortness of breath with ADLs. Madison West has been stable on 2L [place] while walking the track. Her oxygen needs have decreased on the bike, and she has been on room air. Goal met on developing more efficient breathing techniques such as purse lipped breathing and diaphragmatic breathing; and practicing self-pacing with activity. Madison West can initiate pursed lip breathing on her own. She practices diaphragmatic breathing before bed. She can self-pace herself when needed and allows herself to take breaks while walking or exercising. Goal not met for smoking cessation. Madison West has not set a plan. She doesn't have a desire to quit  smoking yet. She states she has cut back. She has been provided resources. Goal progressing for weight loss. Madison West is currently down ~1# since starting the program. She is working with our dietitian to decrease calories and make healthy choices. Goal met for decreased stress. Madison West's health is better than it was when she started the program and her tests and lab results have come back negative. Madison West will continue to benefit from PR for nutrition,  education, exercise, and lifestyle modification.   Expected Outcomes Madison West will continue to participate in cardiac rehab for exercise, nutrition and lifestyle modifications Madison West will continue to participate in cardiac rehab for exercise, nutrition and lifestyle modifications Madison West will continue to participate in cardiac rehab for exercise, nutrition and lifestyle modifications Pt will show progress toward meeting expected goals and outcomes. Pt will show progress toward meeting expected goals and outcomes.    Row Name 02/18/24 1054             Core Components/Risk Factors/Patient Goals Review   Personal Goals Review Weight Management/Obesity;Stress;Improve shortness of breath with ADL's       Review Monthly review of patient's Core Components/Risk Factors/Patient Goals are as follows: Goal in progress for improving her shortness of breath with ADLs. Madison West has been stable on 2L [place] while walking the track. Goal not met for smoking cessation. Madison West has not set a plan. She doesn't have a desire to quit smoking yet. She states she has cut back. She has been provided resources. Goal not met for weight loss. Madison West has maintained her weight since last month. She is working with our dietitian to decrease calories and make healthy choices. Goal not met for decreased stress. New stressors have evolved in Madison West's life, and she continues to work through with help from her daughter and therapist. Madison West will continue to benefit from PR for nutrition, education, exercise, and lifestyle modification.       Expected Outcomes Pt will show progress toward meeting expected goals and outcomes.          Core Components/Risk Factors/Patient Goals at Discharge (Final Review):   Goals and Risk Factor Review - 02/18/24 1054       Core Components/Risk Factors/Patient Goals Review   Personal Goals Review Weight Management/Obesity;Stress;Improve shortness of breath with ADL's    Review Monthly review of patient's Core  Components/Risk Factors/Patient Goals are as follows: Goal in progress for improving her shortness of breath with ADLs. Madison West has been stable on 2L [place] while walking the track. Goal not met for smoking cessation. Madison West has not set a plan. She doesn't have a desire to quit smoking yet. She states she has cut back. She has been provided resources. Goal not met for weight loss. Madison West has maintained her weight since last month. She is working with our dietitian to decrease calories and make healthy choices. Goal not met for decreased stress. New stressors have evolved in Madison West's life, and she continues to work through with help from her daughter and therapist. Madison West will continue to benefit from PR for nutrition, education, exercise, and lifestyle modification.    Expected Outcomes Pt will show progress toward meeting expected goals and outcomes.          ITP Comments: Pt is making expected progress toward Pulmonary Rehab goals after completing 19 session(s). Recommend continued exercise, life style modification, education, and utilization of breathing techniques to increase stamina and strength, while also decreasing shortness of breath with exertion.  Dr. Slater Staff is Medical Director for  Pulmonary Rehab at [facility].

## 2024-02-24 ENCOUNTER — Encounter (HOSPITAL_COMMUNITY)
Admission: RE | Admit: 2024-02-24 | Discharge: 2024-02-24 | Disposition: A | Discharge status: Home / Self Care | Source: Ambulatory Visit | Attending: Pulmonary Disease | Admitting: Pulmonary Disease

## 2024-02-24 DIAGNOSIS — R0609 Other forms of dyspnea: Secondary | ICD-10-CM | POA: Diagnosis not present

## 2024-02-24 NOTE — Progress Notes (Signed)
 Daily Session Note  Patient Details  Name: Madison West MRN: [id number] Date of Birth: December 04, 1955 Referring Provider:   Conrad Ports Pulmonary Rehab Walk Test from 12/01/2023 in [facility]ascular, [facility]  Referring Provider Mannam    Encounter Date: 02/24/2024  Check In:  Session Check In - 02/24/24 1354       Check-In   Supervising physician immediately available to respond to emergencies [facility] MD immediately available    Physician(s) Barnie Hila, NP    Location [facility]    Staff Present Johnnie Moats, MS, ACSM-CEP, Exercise Physiologist;Casey Claudene Candia Levin, RN, BSN;Randi Reeve BS, ACSM-CEP, Exercise Physiologist;Bailey Elnor, MS, Exercise Physiologist    Virtual Visit No    Medication changes reported     No    Fall or balance concerns reported    No    Tobacco Cessation No Change    Warm-up and Cool-down Performed as group-led instruction    Resistance Training Performed Yes    VAD Patient? No    PAD/SET Patient? No      Pain Assessment   Currently in Pain? No/denies    Pain Score 0-No pain    Multiple Pain Sites No          Capillary Blood Glucose: No results found for this or any previous visit (from the past 24 hours).    Social History   Tobacco Use  Smoking Status Former   Current packs/day: 0.00   Average packs/day: 0.8 packs/day for 20.0 years (15.0 ttl pk-yrs)   Types: Cigarettes   Start date: 05/06/1977   Quit date: 05/06/1997   Years since quitting: 26.8  Smokeless Tobacco Never    Goals Met:  Proper associated with RPD/PD & O2 Sat Independence with exercise equipment Exercise tolerated well No report of concerns or symptoms today Strength training completed today  Goals Unmet:  Not Applicable  Comments: Service time is from 1330 to 1441.    Dr. Slater Staff is Medical Director for Pulmonary Rehab at [facility].

## 2024-02-25 ENCOUNTER — Ambulatory Visit: Attending: Cardiology | Admitting: Cardiology

## 2024-02-25 ENCOUNTER — Encounter: Payer: Self-pay | Admitting: Cardiology

## 2024-02-25 VITALS — BP 130/70 | HR 82 | Resp 16 | Ht 68.0 in | Wt 193.0 lb

## 2024-02-25 DIAGNOSIS — I255 Ischemic cardiomyopathy: Secondary | ICD-10-CM | POA: Diagnosis present

## 2024-02-25 DIAGNOSIS — I5020 Unspecified systolic (congestive) heart failure: Secondary | ICD-10-CM | POA: Diagnosis present

## 2024-02-25 DIAGNOSIS — M79605 Pain in left leg: Secondary | ICD-10-CM | POA: Insufficient documentation

## 2024-02-25 DIAGNOSIS — I2510 Atherosclerotic heart disease of native coronary artery without angina pectoris: Secondary | ICD-10-CM | POA: Diagnosis present

## 2024-02-25 MED ORDER — BISOPROLOL FUMARATE 2.5 MG PO TABS: 2.5000 mg | ORAL_TABLET | Freq: Every day | ORAL | 3 refills | Status: DC

## 2024-02-25 NOTE — Patient Instructions (Signed)
 Medication Instructions:  START Bisoprolol  2.5 mg daily  STOP Aspirin   *If you need a refill on your cardiac medications before your next appointment, please call your pharmacy*  Lab Work: NONE If you have labs (blood work) drawn today and your tests are completely normal, you will receive your results only by: MyChart Message (if you have MyChart) OR A paper copy in the mail If you have any lab test that is abnormal or we need to change your treatment, we will call you to review the results.  Testing/Procedures: Lower Extremity Venous Doppler DVT Study Your physician has requested that you have a lower extremity venous duplex. This test is an ultrasound of the veins in the legs or arms. It looks at venous blood flow that carries blood from the heart to the legs or arms. Allow one hour for a Lower Venous exam. Allow thirty minutes for an Upper Venous exam. There are no restrictions or special instructions.  Please note: We ask at that you not bring children with you during ultrasound (echo/ vascular) testing. Due to room size and safety concerns, children are not allowed in the ultrasound rooms during exams. Our front office staff cannot provide observation of children in our lobby area while testing is being conducted. An adult accompanying a patient to their appointment will only be allowed in the ultrasound room at the discretion of the ultrasound technician under special circumstances. We apologize for any inconvenience.   Referral to Electrophysiology  Follow-Up: At [facility], you and your health needs are our priority.  As part of our continuing mission to provide you with exceptional heart care, our providers are all part of one team.  This team includes your primary Cardiologist (physician) and Advanced Practice Providers or APPs (Physician Assistants and Nurse Practitioners) who all work together to provide you with the care you need, when you need it.  Your next  appointment:   3 Months   Provider:   [name], MD    We recommend signing up for the patient portal called MyChart.  Sign up information is provided on this After Visit Summary.  MyChart is used to connect with patients for Virtual Visits (Telemedicine).  Patients are able to view lab/test results, encounter notes, upcoming appointments, etc.  Non-urgent messages can be sent to your provider as well.   To learn more about what you can do with MyChart, go to [email or web address].

## 2024-02-25 NOTE — Progress Notes (Signed)
 Cardiology Office Note:  .   Date:  02/25/2024  ID:  Madison West, DOB 1956-03-16, MRN [id number] PCP: Madison Aldona CROME, NP   [facility]Care Providers Cardiologist:  Madison Lawrence, MD PCP: Madison Aldona CROME, NP  Chief Complaint  Patient presents with   Ishcemic cardiomyopathy      History of Present Illness: .    Madison West is a 68 y.o. female with hyperlipidemia, CAD, HFrEF, h/o DVT, carotid artery disease s/p Rt CEA, h/o breast cancer  Patient had prolonged hospital stay in 01/2023 with VF arrest due to acute stent thrombosis, underwent TAVR for severe AS , developed provoked DVT in Lt internal jugular along with acute superficial vein thrombosis involving the left cephalic vein, proximal upper arm to mid forearm (01/2023).  Patient has completed anticoagulation since then, and is currently on aspirin  and Plavix .  Recent echocardiogram showed EF 35%.  Patient reports exertional dyspnea with more than usual activity, but denies any orthopnea, PND, leg edema symptoms.  She recently underwent bronchoscopy and biopsy with Dr. Theophilus for hemoptysis, that showed no malignancy.  Hemoptysis was thought to be related to aspirin  and Plavix  use.  CT scan showed slight lung inflammation, possibly due to hypersensitive pneumonitis or congestive heart failure.  She was recommended follow-up CT scan if hemoptysis persists or worsens.  Separately, patient reported pain in her left calf recently, particularly late at night.  She did also did not report any specific exertional claudication symptoms.  Vitals:   02/25/24 1315  BP: 130/70  Pulse: 82  Resp: 16  SpO2: 94%        ROS:  Review of Systems  Cardiovascular:  Positive for dyspnea on exertion (Mild). Negative for chest pain, leg swelling, palpitations and syncope.     Studies Reviewed: Madison West        Labs 12/2023: Chol 159, TG 128, HDL 54, LDL 82 Cr 0.8 TSH 2.5  Labs 10/2023: Chol 143, TG 99, HDL 54, LDL 71 Hb  12.2 Cr 0.87 TSH 6.3  08/2023: ProBNP 866 12/2022: Chol 220, TG 126, HDL 51, LDL 146  Echocardiogram 01/2024:  1. Left ventricular ejection fraction, by estimation, is 35%. The left  ventricle has normal function. The left ventricle demonstrates regional  wall motion abnormalities (Septal /apical akinesis no mural thrombus. mid/distal anterior and inferior walls hypokinetic Mostly basal function preserved.  The left ventricular internal cavity  size was moderately to severely dilated. There is moderate left ventricular hypertrophy. Left ventricular diastolic parameters are indeterminate.   2. Right ventricular systolic function is normal. The right ventricular  size is normal.   3. Left atrial size was moderately dilated.   4. The mitral valve is abnormal. Trivial mitral valve regurgitation. No  evidence of mitral stenosis. Moderate mitral annular calcification.   5. 26 mm Sapien TAVR valve no PVL mean gradient 6 peak 10.3 mmHg AVA 2.2  cm2. The aortic valve has been repaired/replaced. There is mild  calcification of the aortic valve. There is mild thickening of the aortic  valve. Aortic valve regurgitation is not  visualized. Aortic valve sclerosis is present, with no evidence of aortic  valve stenosis. There is a 26 mm Edwards Sapien prosthetic (TAVR) valve  present in the aortic position. Procedure Date: 02/04/2023.   6. The inferior vena cava is normal in size with greater than 50%  respiratory variability, suggesting right atrial pressure of 3 mmHg.    Physical Exam:   Physical Exam Vitals and nursing note reviewed.  Constitutional:      General: She is not in acute distress. Neck:     Vascular: No JVD.  Cardiovascular:     Rate and Rhythm: Normal rate and regular rhythm.     Heart sounds: Normal heart sounds. No murmur heard. Pulmonary:     Effort: Pulmonary effort is normal.     Breath sounds: Normal breath sounds. No wheezing or rales.  Musculoskeletal:     Right lower  leg: No edema.     Left lower leg: No edema.      VISIT DIAGNOSES:   ICD-10-CM   1. HFrEF (heart failure with reduced ejection fraction) (HCC)  I50.20     2. Ischemic cardiomyopathy  I25.5 Ambulatory referral to Cardiac Electrophysiology    3. Left leg pain  M79.605 VAS US  LOWER EXTREMITY VENOUS (DVT)    4. Coronary artery disease involving native coronary artery of native heart without angina pectoris  I25.10          ASSESSMENT AND PLAN: .    Madison West is a 68 y.o. female with  hyperlipidemia, CAD, HFrEF, h/o DVT, carotid artery disease s/p Rt CEA, h/o breast cancer   CAD: PCI to LAD, complicated by stent thrombosis while on Plavix  in the setting of severe AS and HFrEF, leading to VF arrest, treated with thrombectomy and balloon dilatation with excellent results (01/2023). Plavix  was chosen as P2 Y12 inhibitor of choice given concurrent use of anticoagulation given diagnosis DVT then. No anginal symptoms at this time. Completed 1 year DAPT with aspirin  and Plavix . Discontinue aspirin , continue Plavix  monotherapy. Currently on Crestor  10 mg daily, has been reluctant to uptitrate statin therapy. Will discuss again at next visit, possibly consider injectable agents.  HFrEF: EF 35% with regional wall motion abnormalities likely related to prior MI.   Clinically, she has NYHA class II symptoms, appears fairly euvolemic with only mildly elevated proBNP.   She has been extremely intolerant to several GDMT medications, currently tolerating losartan  25 mg daily, spironolactone  25 mg daily, Farxiga  10 mg daily.   After much discussion, she is open to starting bisoprolol  2.5 mg daily. Regardless, I remain skeptical of her tolerance for GDMT. I will refer her to EP for consideration for ICD. Although ischemic cardiomyopathy is most likely etiology, I have encouraged her to reduce alcohol intake, currently drinks about 10 drinks weekly.  Hemoptysis: Continue follow-up with  pulmonology. I am optimistic this will improve after stopping aspirin .  H/o VF arrest: Occurred in the setting of acute stent thrombosis in 01/2023. Referred to EP for consideration of ICD given persistent low EF.  Left leg pain: Benign exam, as prior provoked DVT. Recommend left lower extremity DVT study.  Mildly  I spent 45 minutes in the care of Madison West today including discussing results of recent echocardiogram, implications of persistent low EF, discussing medical management of ischemic cardiomyopathy, referring to electrophysiology and coordinating care, and documenting in the encounter.   F/u in 3 months  Signed, Madison JINNY Lawrence, MD

## 2024-02-28 ENCOUNTER — Observation Stay (HOSPITAL_COMMUNITY)
Admission: EM | Admit: 2024-02-28 | Discharge: 2024-03-01 | Disposition: A | Discharge status: Home / Self Care | Attending: Family Medicine | Admitting: Family Medicine

## 2024-02-28 ENCOUNTER — Encounter: Admitting: Physical Medicine and Rehabilitation

## 2024-02-28 ENCOUNTER — Telehealth: Payer: Self-pay

## 2024-02-28 DIAGNOSIS — R002 Palpitations: Secondary | ICD-10-CM | POA: Diagnosis present

## 2024-02-28 DIAGNOSIS — Z7982 Long term (current) use of aspirin: Secondary | ICD-10-CM | POA: Diagnosis not present

## 2024-02-28 DIAGNOSIS — I2510 Atherosclerotic heart disease of native coronary artery without angina pectoris: Principal | ICD-10-CM | POA: Insufficient documentation

## 2024-02-28 DIAGNOSIS — E785 Hyperlipidemia, unspecified: Secondary | ICD-10-CM | POA: Insufficient documentation

## 2024-02-28 DIAGNOSIS — I5022 Chronic systolic (congestive) heart failure: Secondary | ICD-10-CM | POA: Insufficient documentation

## 2024-02-28 DIAGNOSIS — Z86718 Personal history of other venous thrombosis and embolism: Secondary | ICD-10-CM | POA: Diagnosis not present

## 2024-02-28 DIAGNOSIS — Z79899 Other long term (current) drug therapy: Secondary | ICD-10-CM | POA: Insufficient documentation

## 2024-02-28 DIAGNOSIS — I110 Hypertensive heart disease with heart failure: Secondary | ICD-10-CM | POA: Diagnosis not present

## 2024-02-28 DIAGNOSIS — I6529 Occlusion and stenosis of unspecified carotid artery: Secondary | ICD-10-CM | POA: Insufficient documentation

## 2024-02-28 DIAGNOSIS — Z853 Personal history of malignant neoplasm of breast: Secondary | ICD-10-CM | POA: Insufficient documentation

## 2024-02-28 DIAGNOSIS — R739 Hyperglycemia, unspecified: Secondary | ICD-10-CM

## 2024-02-28 DIAGNOSIS — R7989 Other specified abnormal findings of blood chemistry: Secondary | ICD-10-CM | POA: Insufficient documentation

## 2024-02-28 NOTE — ED Triage Notes (Signed)
 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 Has a referral for a defib put in.   Hr 90s, no longer feels palpatations with EMS 20g LAC 156/80 98%

## 2024-02-28 NOTE — Telephone Encounter (Signed)
    Primary Cardiologist: [name], MD  Chart reviewed as part of pre-operative protocol coverage. Simple dental extractions are considered low risk procedures per guidelines and generally do not require any specific cardiac clearance. It is also generally accepted that for simple extractions and dental cleanings, there is no need to interrupt blood thinner therapy.   SBE prophylaxis is required for the patient.  I will route this recommendation to the requesting party via Epic fax function and remove from pre-op pool.  Please call with questions.  Madison CHRISTELLA Beauvais, NP 02/28/2024, 11:47 AM

## 2024-02-28 NOTE — Telephone Encounter (Signed)
   Pre-operative Risk Assessment    Patient Name: Madison West  DOB: 1956-04-03 MRN: [id number]   Date of last office visit: 02/25/24 NEWMAN LAWRENCE, MD Date of next office visit: 06/07/24 [facility], MD  Request for Surgical Clearance    Procedure:  Dental Extraction - Amount of Teeth to be Pulled:  1 TOOTH  Date of Surgery:  Clearance TBD                                Surgeon:  KRYSTAL MUSLIM, MD Surgeon's Group or Practice Name:  [facility] Phone number:  [phone] Fax number:  [phone]   Type of Clearance Requested:   - Medical  - Pharmacy:  Hold Clopidogrel  (Plavix )     Type of Anesthesia:  IV SEDATION   Additional requests/questions:    Signed, [name]   02/28/2024, 9:45 AM

## 2024-02-29 ENCOUNTER — Encounter (HOSPITAL_COMMUNITY): Admission: RE | Admit: 2024-02-29 | Source: Ambulatory Visit

## 2024-02-29 ENCOUNTER — Other Ambulatory Visit: Payer: Self-pay

## 2024-02-29 ENCOUNTER — Encounter (HOSPITAL_COMMUNITY): Payer: Self-pay

## 2024-02-29 ENCOUNTER — Emergency Department (HOSPITAL_COMMUNITY)

## 2024-02-29 ENCOUNTER — Telehealth (HOSPITAL_COMMUNITY): Payer: Self-pay

## 2024-02-29 DIAGNOSIS — R000 Tachycardia, unspecified: Secondary | ICD-10-CM | POA: Diagnosis not present

## 2024-02-29 DIAGNOSIS — R002 Palpitations: Principal | ICD-10-CM | POA: Diagnosis present

## 2024-02-29 DIAGNOSIS — I5020 Unspecified systolic (congestive) heart failure: Secondary | ICD-10-CM

## 2024-02-29 DIAGNOSIS — I2510 Atherosclerotic heart disease of native coronary artery without angina pectoris: Secondary | ICD-10-CM | POA: Diagnosis not present

## 2024-02-29 LAB — BASIC METABOLIC PANEL WITH GFR
Anion gap: 10 (ref 5–15)
BUN: 18 mg/dL (ref 8–23)
CO2: 24 mmol/L (ref 22–32)
Calcium: 9.2 mg/dL (ref 8.9–10.3)
Chloride: 102 mmol/L (ref 98–111)
Creatinine, Ser: 1.05 mg/dL — ABNORMAL HIGH (ref 0.44–1.00)
GFR, Estimated: 57 mL/min — ABNORMAL LOW (ref >60)
Glucose, Bld: 106 mg/dL — ABNORMAL HIGH (ref 70–99)
Potassium: 4 mmol/L (ref 3.5–5.1)
Sodium: 136 mmol/L (ref 135–145)

## 2024-02-29 LAB — CBC
HCT: 41.9 % (ref 36.0–46.0)
Hemoglobin: 13.2 g/dL (ref 12.0–15.0)
MCH: 26.4 pg (ref 26.0–34.0)
MCHC: 31.5 g/dL (ref 30.0–36.0)
MCV: 83.8 fL (ref 80.0–100.0)
Platelets: 447 K/uL — ABNORMAL HIGH (ref 150–400)
RBC: 5 MIL/uL (ref 3.87–5.11)
RDW: 17.8 % — ABNORMAL HIGH (ref 11.5–15.5)
WBC: 10.3 K/uL (ref 4.0–10.5)
nRBC: 0 % (ref 0.0–0.2)

## 2024-02-29 LAB — TROPONIN I (HIGH SENSITIVITY)
Troponin I (High Sensitivity): 20 ng/L — ABNORMAL HIGH (ref <18)
Troponin I (High Sensitivity): 26 ng/L — ABNORMAL HIGH (ref <18)
Troponin I (High Sensitivity): 27 ng/L — ABNORMAL HIGH (ref <18)

## 2024-02-29 LAB — TSH: TSH: 3.401 u[IU]/mL (ref 0.350–4.500)

## 2024-02-29 LAB — HIV ANTIBODY (ROUTINE TESTING W REFLEX): HIV Screen 4th Generation wRfx: NONREACTIVE

## 2024-02-29 MED ORDER — BISOPROLOL FUMARATE 5 MG PO TABS
2.5000 mg | ORAL_TABLET | Freq: Every day | ORAL | Status: DC
  Administered 2024-02-29 – 2024-03-01 (×2): 2.5 mg via ORAL
  Filled 2024-02-29: qty 0.5
  Filled 2024-02-29: qty 1

## 2024-02-29 MED ORDER — ALPRAZOLAM 0.5 MG PO TABS
1.0000 mg | ORAL_TABLET | Freq: Two times a day (BID) | ORAL | Status: DC | PRN
  Administered 2024-02-29 – 2024-03-01 (×3): 1 mg via ORAL
  Filled 2024-02-29: qty 4
  Filled 2024-02-29 (×2): qty 2

## 2024-02-29 MED ORDER — OXYCODONE HCL 5 MG PO TABS
5.0000 mg | ORAL_TABLET | Freq: Every day | ORAL | Status: DC | PRN
  Administered 2024-02-29 – 2024-03-01 (×2): 5 mg via ORAL
  Filled 2024-02-29 (×3): qty 1

## 2024-02-29 MED ORDER — DAPAGLIFLOZIN PROPANEDIOL 10 MG PO TABS
10.0000 mg | ORAL_TABLET | Freq: Every day | ORAL | Status: DC
Start: 2024-02-29 — End: 2024-03-01
  Administered 2024-02-29 – 2024-03-01 (×2): 10 mg via ORAL
  Filled 2024-02-29 (×2): qty 1

## 2024-02-29 MED ORDER — ACETAMINOPHEN 325 MG PO TABS
650.0000 mg | ORAL_TABLET | ORAL | Status: DC | PRN
  Administered 2024-02-29 – 2024-03-01 (×3): 650 mg via ORAL
  Filled 2024-02-29 (×3): qty 2

## 2024-02-29 MED ORDER — FUROSEMIDE 20 MG PO TABS: 10.0000 mg | ORAL_TABLET | Freq: Every day | ORAL | Status: DC | PRN

## 2024-02-29 MED ORDER — BUSPIRONE HCL 10 MG PO TABS
5.0000 mg | ORAL_TABLET | Freq: Every day | ORAL | Status: DC
Start: 2024-02-29 — End: 2024-03-01
  Administered 2024-02-29 – 2024-03-01 (×2): 10 mg via ORAL
  Filled 2024-02-29 (×2): qty 1

## 2024-02-29 MED ORDER — NITROGLYCERIN 0.4 MG SL SUBL: 0.4000 mg | SUBLINGUAL_TABLET | SUBLINGUAL | Status: DC | PRN

## 2024-02-29 MED ORDER — METHOCARBAMOL 500 MG PO TABS
500.0000 mg | ORAL_TABLET | Freq: Two times a day (BID) | ORAL | Status: DC | PRN
  Administered 2024-02-29 – 2024-03-01 (×2): 500 mg via ORAL
  Filled 2024-02-29 (×2): qty 1

## 2024-02-29 MED ORDER — CLOPIDOGREL BISULFATE 75 MG PO TABS
75.0000 mg | ORAL_TABLET | Freq: Every day | ORAL | Status: DC
  Administered 2024-02-29 – 2024-03-01 (×2): 75 mg via ORAL
  Filled 2024-02-29 (×2): qty 1

## 2024-02-29 MED ORDER — LOSARTAN POTASSIUM 25 MG PO TABS
25.0000 mg | ORAL_TABLET | Freq: Every day | ORAL | Status: DC
  Administered 2024-02-29 – 2024-03-01 (×2): 25 mg via ORAL
  Filled 2024-02-29 (×2): qty 1

## 2024-02-29 MED ORDER — SPIRONOLACTONE 25 MG PO TABS
25.0000 mg | ORAL_TABLET | Freq: Every day | ORAL | Status: DC
  Administered 2024-02-29 – 2024-03-01 (×2): 25 mg via ORAL
  Filled 2024-02-29 (×2): qty 1

## 2024-02-29 MED ORDER — ASPIRIN 81 MG PO CHEW
324.0000 mg | CHEWABLE_TABLET | Freq: Once | ORAL | Status: AC
  Administered 2024-02-29: 324 mg via ORAL
  Filled 2024-02-29: qty 4

## 2024-02-29 MED ORDER — ENOXAPARIN SODIUM 40 MG/0.4ML IJ SOSY
40.0000 mg | PREFILLED_SYRINGE | INTRAMUSCULAR | Status: DC
  Administered 2024-02-29 – 2024-03-01 (×2): 40 mg via SUBCUTANEOUS
  Filled 2024-02-29 (×2): qty 0.4

## 2024-02-29 MED ORDER — ROSUVASTATIN CALCIUM 5 MG PO TABS
10.0000 mg | ORAL_TABLET | Freq: Every day | ORAL | Status: DC
  Administered 2024-02-29 – 2024-03-01 (×2): 10 mg via ORAL
  Filled 2024-02-29 (×2): qty 2

## 2024-02-29 NOTE — ED Provider Notes (Signed)
 [place] EMERGENCY DEPARTMENT AT [facility] Provider Note   CSN: [id number] Arrival date & time: 02/28/24  2349     Patient presents with: Palpitations   Madison West is a 68 y.o. female.   The history is provided by the patient.  Palpitations  She has history of hypertension, coronary artery disease, PSVT, breast cancer status post radiation therapy, heart failure with reduced ejection fraction and comes in because of an episode of palpitations.  She states that she suddenly felt like her heart was beating out of her chest, and her heart rate was as high as 136.  This occurred for several hours.  She did go to fire station which was unable to do an electrocardiogram.  When EMS came, she states heart rate had gone down, she feels like she is at her baseline.  She is very anxious, she is supposed to be seeing a cardiac electrophysiologist about possible defibrillator placement because of persistent low ejection fraction.  When her heart was beating fast, she did have some sharp chest pains, denies dyspnea or nausea or diaphoresis.    Prior to Admission medications   Medication Sig Start Date End Date Taking? Authorizing Provider  acetaminophen  (TYLENOL ) 325 MG tablet Take 2 tablets (650 mg total) by mouth every 6 (six) hours. 03/05/23   Setzer, Sandra J, PA-C  alprazolam  (XANAX ) 2 MG tablet Take 0.25-2 mg by mouth See admin instructions. 1 mg (may take up to 2 mg) twice daily. May also take 0.25 mg during the day if needed for anxiety.    [provider]  amoxicillin  (AMOXIL ) 500 MG tablet Take 4 tablets (2,000 mg total) by mouth as directed. 1 HOUR PRIOR TO DENTAL PROCEDURES, INCLUDING CLEANINGS 02/14/24   Sebastian Lamarr SAUNDERS, PA-C  Bisoprolol  Fumarate 2.5 MG TABS Take 2.5 mg by mouth daily. 02/25/24   Patwardhan, Newman PARAS, MD  busPIRone  (BUSPAR ) 10 MG tablet TAKE 1 TABLET BY MOUTH TWICE A DAY 06/29/23   Raulkar, Sven SQUIBB, MD  clopidogrel  (PLAVIX ) 75 MG tablet Take 1 tablet  (75 mg total) by mouth daily. 12/13/23   Patwardhan, Newman PARAS, MD  dapagliflozin  propanediol (FARXIGA ) 10 MG TABS tablet Take 1 tablet (10 mg total) by mouth daily before breakfast. 12/13/23   Patwardhan, Newman PARAS, MD  furosemide  (LASIX ) 20 MG tablet Take 0.5 tablets (10 mg total) by mouth daily as needed (for lower extremity swelling and weight gain). 12/13/23   Patwardhan, Newman PARAS, MD  losartan  (COZAAR ) 25 MG tablet Take 1 tablet (25 mg total) by mouth daily. 12/13/23   Patwardhan, Newman PARAS, MD  methocarbamol  (ROBAXIN ) 500 MG tablet Take 1 tablet (500 mg total) by mouth 2 (two) times daily. 06/04/23   Raulkar, Sven SQUIBB, MD  nitroGLYCERIN  (NITROSTAT ) 0.4 MG SL tablet Place 1 tablet (0.4 mg total) under the tongue every 5 (five) minutes as needed for chest pain. 03/15/23   Raulkar, Sven SQUIBB, MD  oxyCODONE  (ROXICODONE ) 5 MG immediate release tablet Take 1 tablet (5 mg total) by mouth daily as needed for severe pain (pain score 7-10). 07/05/23   Raulkar, Sven SQUIBB, MD  rosuvastatin  (CRESTOR ) 10 MG tablet Take 1 tablet (10 mg total) by mouth daily. 12/13/23   Patwardhan, Newman PARAS, MD  spironolactone  (ALDACTONE ) 25 MG tablet Take 1 tablet (25 mg total) by mouth daily. 12/13/23   Patwardhan, Newman PARAS, MD    Allergies: Prednisone, Fentanyl , Versed  [midazolam ], and Vicodin [hydrocodone -acetaminophen ]    Review of Systems  Cardiovascular:  Positive  for palpitations.  All other systems reviewed and are negative.   Updated Vital Signs BP 127/72 (BP Location: Left Arm)   Pulse 85   Temp 97.8 F (36.6 C) (Oral)   Resp 16   Ht 5' 8 (1.727 m)   Wt 87.5 kg   SpO2 99%   BMI 29.33 kg/m   Physical Exam Vitals and nursing note reviewed.   69 year old female, resting comfortably and in no acute distress. Vital signs are normal. Oxygen saturation is 99%, which is normal. Head is normocephalic and atraumatic. PERRLA, EOMI. Oropharynx is clear. Neck is nontender and supple without adenopathy or JVD. Back is  nontender and there is no CVA tenderness. Lungs are clear without rales, wheezes, or rhonchi. Chest is nontender. Heart has regular rate and rhythm without murmur. Abdomen is soft, flat, nontender without masses or hepatosplenomegaly and peristalsis is normoactive. Extremities have no cyanosis or edema, full range of motion is present. Skin is warm and dry without rash. Neurologic: Mental status is normal, cranial nerves are intact, there are no motor or sensory deficits.  (all labs ordered are listed, but only abnormal results are displayed) Labs Reviewed  BASIC METABOLIC PANEL WITH GFR - Abnormal; Notable for the following components:      Result Value   Glucose, Bld 106 (*)    Creatinine, Ser 1.05 (*)    GFR, Estimated 57 (*)    All other components within normal limits  CBC - Abnormal; Notable for the following components:   RDW 17.8 (*)    Platelets 447 (*)    All other components within normal limits  TROPONIN I (HIGH SENSITIVITY) - Abnormal; Notable for the following components:   Troponin I (High Sensitivity) 20 (*)    All other components within normal limits  TROPONIN I (HIGH SENSITIVITY) - Abnormal; Notable for the following components:   Troponin I (High Sensitivity) 27 (*)    All other components within normal limits  TROPONIN I (HIGH SENSITIVITY) - Abnormal; Notable for the following components:   Troponin I (High Sensitivity) 26 (*)    All other components within normal limits  HIV ANTIBODY (ROUTINE TESTING W REFLEX)  TSH    EKG: ED ECG REPORT   Date: 02/29/2024  Rate: 87  Rhythm: normal sinus rhythm  QRS Axis: left  Intervals: PR prolonged  ST/T Wave abnormalities: normal  Conduction Disutrbances:left anterior fascicular block  Narrative Interpretation: Sinus rhythm with first-degree AV block, left anterior fascicular block, left ventricular hypertrophy, left atrial enlargement.  When compared with ECG of 03/17/2023, no significant changes are seen.  Old  EKG Reviewed: unchanged  I have personally reviewed the EKG tracing and agree with the computerized printout as noted.  Radiology: DG Chest 2 View Result Date: 02/29/2024 EXAM: 2 VIEW(S) XRAY OF THE CHEST 02/29/2024 12:24:00 AM COMPARISON: 11/10/2023 CLINICAL HISTORY: Chest palpitations. 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 Has a referral for a defib put in. Hr 90s, no longer feels palpitations with EMS; 20g LAC; 156/80; 98%. FINDINGS: LUNGS AND PLEURA: Left basilar atelectasis. No pleural effusion or pneumothorax. HEART AND MEDIASTINUM: Stable cardiomediastinal silhouette. TAVR. Coronary stenting. Aortic atherosclerotic calcification. BONES AND SOFT TISSUES: No acute osseous abnormality. IMPRESSION: 1. No acute findings. Electronically signed by: Norman Gatlin MD 02/29/2024 12:34 AM EDT RP Workstation: [id number]     Procedures   Medications Ordered in the ED  enoxaparin  (LOVENOX ) injection 40 mg (40 mg Subcutaneous Given 02/29/24 1017)  spironolactone  (ALDACTONE ) tablet 25 mg (25 mg Oral Given 02/29/24 1016)  rosuvastatin  (CRESTOR ) tablet 10 mg (10 mg Oral Given 02/29/24 1015)  oxyCODONE  (Oxy IR/ROXICODONE ) immediate release tablet 5 mg (5 mg Oral Given 02/29/24 1023)  nitroGLYCERIN  (NITROSTAT ) SL tablet 0.4 mg (has no administration in time range)  methocarbamol  (ROBAXIN ) tablet 500 mg (500 mg Oral Given 02/29/24 1553)  losartan  (COZAAR ) tablet 25 mg (25 mg Oral Given 02/29/24 1015)  furosemide  (LASIX ) tablet 10 mg (has no administration in time range)  dapagliflozin  propanediol (FARXIGA ) tablet 10 mg (10 mg Oral Given 02/29/24 1553)  clopidogrel  (PLAVIX ) tablet 75 mg (75 mg Oral Given 02/29/24 1015)  busPIRone  (BUSPAR ) tablet 5-10 mg (10 mg Oral Given 02/29/24 1016)  ALPRAZolam  (XANAX ) tablet 1 mg (1 mg Oral Given 02/29/24 1014)  acetaminophen  (TYLENOL ) tablet 650 mg (650 mg Oral Given 02/29/24 2023)  bisoprolol  (ZEBETA ) tablet 2.5 mg (2.5 mg Oral Given  02/29/24 1016)  aspirin  chewable tablet 324 mg (324 mg Oral Given 02/29/24 0430)                                    Medical Decision Making Amount and/or Complexity of Data Reviewed Labs: ordered. Radiology: ordered.  Risk OTC drugs. Decision regarding hospitalization.   Episode of palpitations and patient with history of PSVT.  She is in sinus rhythm now.  I have reviewed her electrocardiogram, my interpretation is sinus rhythm with left anterior fascicular block unchanged from prior.  I have reviewed her laboratory tests, and my interpretation is mildly elevated troponin which will need to be repeated, mildly elevated random glucose level, mildly elevated creatinine not significantly changed from baseline, mild thrombocytosis.  I have reviewed her past records, and noted echocardiogram on 02/14/2024 showing left ventricular ejection fraction 35% and indeterminant left ventricular diastolic parameters.  Chest x-ray shows no acute findings.  I have independently viewed the images, and agree with the radiologist's interpretation.  I have reviewed her repeat troponin has increased slightly.  Because of this, I feel she will need ongoing observation.  I have discussed the case with Dr. Arthea of [facility] hospitalist who agrees to admit the patient.  I have also discussed the case with Dr. Orlando of cardiology service who agrees to see the patient in consultation..     Final diagnoses:  Palpitations  Elevated troponin  Elevated random blood glucose level    ED Discharge Orders     None          Raford Lenis, MD 02/29/24 2315

## 2024-02-29 NOTE — Telephone Encounter (Signed)
 Patient c/o for 1:15 PR class, states she is currently in the ED but expects to be back in class on Thursday.

## 2024-02-29 NOTE — ED Notes (Signed)
 Call placed to [facility] for cardiac monitoring.

## 2024-02-29 NOTE — ED Notes (Signed)
 Pt denies any chest pain, dizziness, shob, of headache at this time. No acute distress at this time. Pt on the phone with daughter.

## 2024-02-29 NOTE — Hospital Course (Addendum)
 Madison West is a 68 y.o. year old female with PMH of CAD with V-fib arrest in 01/2023 due to acute in-stent thrombosis, severe AS s/p TAV, HFrEF, h/o DVT, CAD s/p Rt CEA, history of breast cancer, HLD presented for evaluation of palpitation at work.  Patient heart rate was up to 136 for several hours 2.5 hr, while checked by her pulse oximetry.she was brought to the ED by EMS, rhythm strip from EMS showed sinus tachycardia with first-degree block.  Of note she has history of 6+ week stay in the ICU for cardiogenic shock, B fever arrest and stent thrombosis, required IABP and discussion ongoing regarding secondary prevention ICD as her LVEF remains low despite GDMT Patient otherwise denies any nausea, vomiting, chest pain, shortness of breath, fever, chills, headache, focal weakness, numbness tingling, speech difficulties. She was anxious and scared and felt some tightness at that time and her recent discussion about possible need for ICD was making her anxious too.  In the ED -vitals fairly stable blood pressure 90s-105, heart rate stable troponin 20> 27 CBC BMP otherwise unremarkable CXR> no acute finding Cardiology consulted and admitted.    Assessment and plan:  Palpitation/sinus tachycardia Hx of V-fib arrest in 01/2023 due to acute in-stent thrombosis: Etiology of palpitation appears to be sinus tachycardia, cardiology has been consulted given her low EF await further recommendation. There was discussion ongoing regarding secondary prevention of ICD. Likely needs Holter at discharge.  Severe AS s/p TAVR: Stable.  CAD HLD  Mild troponin leak likely demand ischemia from palpitation: No delta on troponin leak.  Continue patient's cardiac meds antiplatelets and statin-  HFrEF: Euvolemic.  Continue home GDMT with losartan  bisoprolol  Aldactone  Farxiga .  h/o DVT: Was on eliquis  briefly and taken off  Carotid artery disease s/p Rt CEA Cont home meds  history of breast cancer    DVT prophylaxis: enoxaparin  (LOVENOX ) injection 40 mg Start: 02/29/24 0900 Code Status:   Code Status: Full Code Family Communication: plan of care discussed with patient at bedside. Patient status is: Remains hospitalized because of severity of illness Level of care: Telemetry Cardiac   Dispo: The patient is from: home            Anticipated disposition: TBD Objective: Vitals last 24 hrs: Vitals:   02/29/24 0400 02/29/24 0500 02/29/24 0520 02/29/24 0821  BP: 95/62 (!) 105/93    Pulse: 74 75    Resp: 20 15    Temp:   97.9 F (36.6 C)   TempSrc:   Oral   SpO2: 94% 100%  99%  Weight:      Height:        Physical Examination: General exam: alert awake, oriented, older than stated age HEENT:Oral mucosa moist, Ear/Nose WNL grossly Respiratory system: Bilaterally clear BS,no use of accessory muscle Cardiovascular system: S1 & S2 +, No JVD. Gastrointestinal system: Abdomen soft,NT,ND, BS+ Nervous System: Alert, awake, moving all extremities,and following commands. Extremities: LE edema neg, distal extremities warm.  Skin: No rashes,no icterus. MSK: Normal muscle bulk,tone, power   Medications reviewed:  Scheduled Meds:  alprazolam   0.25-2 mg Oral See admin instructions   Bisoprolol  Fumarate  2.5 mg Oral Daily   busPIRone   5-10 mg Oral Daily   clopidogrel   75 mg Oral Daily   dapagliflozin  propanediol  10 mg Oral QAC breakfast   enoxaparin  (LOVENOX ) injection  40 mg Subcutaneous Q24H   losartan   25 mg Oral Daily   rosuvastatin   10 mg Oral Daily   spironolactone   25 mg Oral Daily   Continuous Infusions: Diet: Diet Order             Diet Heart Room service appropriate? Yes; Fluid consistency: Thin  Diet effective now

## 2024-02-29 NOTE — Consult Note (Addendum)
 Cardiology Consultation   Patient ID: Madison West MRN: [id number]; DOB: 1955/07/12  Admit date: 02/28/2024 Date of Consult: 02/29/2024  PCP:  Teresa Aldona CROME, NP   [facility] Providers Cardiologist:  [name], MD  Structural Heart:  Lurena MARLA Red, MD   Patient Profile: Madison West is a 68 y.o. female with a hx of CAD with VF arrest in 01/2023 due to acute stent thrombosis, severe AS s/p TAVR, HFrEF, h/o DVT, CAD s/p Right CEA, history of breast cancer who is being seen 02/29/2024 for the evaluation of palpitations at the request of Dr. Raford.  History of Present Illness: Madison West presents today as she had palpitations while at work. She states she felt like her heart was beating out of her chest. It was 136 bpm for several hours (has a pulse ox in her purse). 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 She denied any sort of abnormal exertion.  On her way home, she was unable to get her heart rate down as she tried to drink some water. She walked over the fire department where they did several ECGs but unfortunately, we do not have them here.   Rhythm strips from EMS look to be sinus tachycardia with first degree AV block.   She now states she feels back to normal and is no longer having any palpitations. She denies any chest pain or shortness of breath. Otherwise, she is now feeling back to her baseline.   The patient does note feeling a little anxious and some associated chest tightness. The patient states she has been very anxious since being told on Friday that she was being referred to EP for a secondary prevention ICD.  The patient had a 6+ week stay here in the ICU for cardiogenic shock and VF arrest after in stent thrombosis of recent DES. She required an IABP. She is being evaluated for secondary prevention ICD as her LVEF remains 35% despite fully tolerated GDMT.  Past Medical History:  Diagnosis Date   Anxiety    Aortic stenosis     s/p TAVR 02/04/23   Arthritis    low back and hip pain intermittent   Breast cancer (HCC) 06/02/2007   r breast -surgery ,radiaology. chemotherapy   Carotid artery disease (HCC)    Carpal tunnel syndrome    right hand   CHF (congestive heart failure) (HCC)    Colon polyps    Complication of anesthesia    Fentanyl , Versed -makes extra hyper, bradycardia x 1 in PACU, [facility] (08/15/11 cardiology felt neostigmine may have resulted in AV nodal block)    Coronary artery disease    Depression    denies   DVT (deep venous thrombosis) (HCC)    provoked left IJ 02/10/23   Dysplasia of vulva    Hypertension    Palpitations    PSVT, s/p adenosine  08/04/16   S/P breast lumpectomy 07/04/2007   R breast   S/P radiation therapy 2009   Vulva cancer [facility])     Past Surgical History:  Procedure Laterality Date   APPENDECTOMY     age 22   CARDIAC CATHETERIZATION     CESAREAN SECTION     x 2   COLONOSCOPY W/ POLYPECTOMY     COLONOSCOPY WITH PROPOFOL  N/A 04/27/2016   Procedure: COLONOSCOPY WITH PROPOFOL ;  Surgeon: Gladis MARLA Louder, MD;  Location: [facility];  Service: Endoscopy;  Laterality: N/A;   CORONARY STENT INTERVENTION N/A 01/29/2023   Procedure:  CORONARY STENT INTERVENTION;  Surgeon: Elmira Newman PARAS, MD;  Location: [facility] INVASIVE CV LAB;  Service: Cardiovascular;  Laterality: N/A;   CORONARY THROMBECTOMY N/A 01/31/2023   Procedure: Coronary Thrombectomy;  Surgeon: Elmira Newman PARAS, MD;  Location: [facility];  Service: Cardiovascular;  Laterality: N/A;   CORONARY ULTRASOUND/IVUS N/A 01/31/2023   Procedure: Coronary Ultrasound/IVUS;  Surgeon: Elmira Newman PARAS, MD;  Location: [facility];  Service: Cardiovascular;  Laterality: N/A;   CORONARY/GRAFT ACUTE MI REVASCULARIZATION N/A 01/31/2023   Procedure: Coronary/Graft Acute MI Revascularization;  Surgeon: Elmira Newman PARAS, MD;  [facility]: [facility];  Service: Cardiovascular;  Laterality: N/A;   DILATION AND  CURETTAGE OF UTERUS     multiple   ENDARTERECTOMY Right 06/09/2019   ENDARTERECTOMY Right 06/09/2019   Procedure: ENDARTERECTOMY CAROTID RIGHT;  Surgeon: Harvey Carlin BRAVO, MD;  Location[facility];  Service: Vascular;  Laterality: Right;   IABP INSERTION N/A 02/02/2023   Procedure: IABP Insertion;  Surgeon: Elmira Newman PARAS, MD;  [facility];  Service: Cardiovascular;  Laterality: N/A;   INTRAUTERINE DEVICE INSERTION     IUD REMOVAL     LEFT HEART CATH AND CORONARY ANGIOGRAPHY N/A 02/27/2020   Procedure: LEFT HEART CATH AND CORONARY ANGIOGRAPHY;  Surgeon: Ladona Heinz, MD;  [facility];  Service: Cardiovascular;  Laterality: N/A;   LEFT HEART CATH AND CORONARY ANGIOGRAPHY N/A 01/31/2023   Procedure: LEFT HEART CATH AND CORONARY ANGIOGRAPHY;  Surgeon: Elmira Newman PARAS, MD;  [facility];  Service: Cardiovascular;  Laterality: N/A;   MASTECTOMY PARTIAL / LUMPECTOMY W/ AXILLARY LYMPHADENECTOMY Right    lumpectomy and lymph nodes removed   PATCH ANGIOPLASTY Right 06/09/2019   Procedure: Patch Angioplasty of right carotid artery using hemashield paltinum finesse patch;  Surgeon: Harvey Carlin BRAVO, MD;  Location: [facility] OR;  Service: Vascular;  Laterality: Right;   RIGHT AND LEFT HEART CATH N/A 02/01/2023   Procedure: RIGHT AND LEFT HEART CATH;  Surgeon: Elmira Newman PARAS, MD;  [facility];  Service: Cardiovascular;  Laterality: N/A;   RIGHT HEART CATH N/A 02/02/2023   Procedure: RIGHT HEART CATH;  Surgeon: Cherrie Toribio SAUNDERS, MD;  [facility];  Service: Cardiovascular;  Laterality: N/A;   RIGHT/LEFT HEART CATH AND CORONARY ANGIOGRAPHY N/A 01/27/2023   Procedure: RIGHT/LEFT HEART CATH AND CORONARY ANGIOGRAPHY;  Surgeon: Elmira Newman PARAS, MD;  [facility];  Service: Cardiovascular;  Laterality: N/A;   TEE WITHOUT CARDIOVERSION N/A 02/04/2023   Procedure: TRANSESOPHAGEAL ECHOCARDIOGRAM;   Surgeon: Wendel Lurena POUR, MD;  Location: [facility] INVASIVE CV LAB;  Service: Open Heart Surgery;  Laterality: N/A;   TEMPORARY PACEMAKER N/A 02/02/2023   Procedure: TEMPORARY PACEMAKER;  Surgeon: Cherrie Toribio SAUNDERS, MD;  [facility];  Service: Cardiovascular;  Laterality: N/A;   TRANSCATHETER AORTIC VALVE REPLACEMENT, TRANSFEMORAL N/A 02/04/2023   Procedure: Transcatheter Aortic Valve Replacement, Transfemoral;  Surgeon: Thukkani, Arun K, MD;  [facility];  Service: Open Heart Surgery;  Laterality: N/A;   TUBAL LIGATION     VIDEO BRONCHOSCOPY Bilateral 11/10/2023   Procedure: VIDEO BRONCHOSCOPY WITHOUT FLUORO;  Surgeon: Mannam, Praveen, MD;  Location: [facility];  Service: Cardiopulmonary;  Laterality: Bilateral;   VIDEO BRONCHOSCOPY WITH ENDOBRONCHIAL ULTRASOUND N/A 11/10/2023   Procedure: BRONCHOSCOPY, WITH EBUS;  Surgeon: Theophilus Roosevelt, MD;  Location: [facility];  Service: Cardiopulmonary;  Laterality: N/A;   VULVA SURGERY  Multiple times for dysplasia     Home Medications:  Prior to Admission medications   Medication Sig Start Date End Date Taking? Authorizing Provider  acetaminophen  (TYLENOL ) 325 MG tablet Take 2 tablets (650 mg total) by mouth every 6 (six) hours. 03/05/23   Setzer, Sandra J, PA-C  alprazolam  (XANAX ) 2 MG tablet Take 0.25-2 mg by mouth See admin instructions. 1 mg (may take up to 2 mg) twice daily. May also take 0.25 mg during the day if needed for anxiety.    [provider]  amoxicillin  (AMOXIL ) 500 MG tablet Take 4 tablets (2,000 mg total) by mouth as directed. 1 HOUR PRIOR TO DENTAL PROCEDURES, INCLUDING CLEANINGS 02/14/24   Sebastian Lamarr SAUNDERS, PA-C  Bisoprolol  Fumarate 2.5 MG TABS Take 2.5 mg by mouth daily. 02/25/24   Shellene Sweigert, Newman PARAS, MD  busPIRone  (BUSPAR ) 10 MG tablet TAKE 1 TABLET BY MOUTH TWICE A DAY 06/29/23   Raulkar, Sven SQUIBB, MD  clopidogrel  (PLAVIX ) 75 MG tablet Take 1 tablet (75 mg total) by mouth daily.  12/13/23   Melanie Pellot, Newman PARAS, MD  dapagliflozin  propanediol (FARXIGA ) 10 MG TABS tablet Take 1 tablet (10 mg total) by mouth daily before breakfast. 12/13/23   Liliana Brentlinger, Newman PARAS, MD  furosemide  (LASIX ) 20 MG tablet Take 0.5 tablets (10 mg total) by mouth daily as needed (for lower extremity swelling and weight gain). 12/13/23   Esli Jernigan, Newman PARAS, MD  losartan  (COZAAR ) 25 MG tablet Take 1 tablet (25 mg total) by mouth daily. 12/13/23   Caterine Mcmeans, Newman PARAS, MD  methocarbamol  (ROBAXIN ) 500 MG tablet Take 1 tablet (500 mg total) by mouth 2 (two) times daily. 06/04/23   Raulkar, Sven SQUIBB, MD  nitroGLYCERIN  (NITROSTAT ) 0.4 MG SL tablet Place 1 tablet (0.4 mg total) under the tongue every 5 (five) minutes as needed for chest pain. 03/15/23   Raulkar, Sven SQUIBB, MD  oxyCODONE  (ROXICODONE ) 5 MG immediate release tablet Take 1 tablet (5 mg total) by mouth daily as needed for severe pain (pain score 7-10). 07/05/23   Raulkar, Sven SQUIBB, MD  rosuvastatin  (CRESTOR ) 10 MG tablet Take 1 tablet (10 mg total) by mouth daily. 12/13/23   Geonna Lockyer, Newman PARAS, MD  spironolactone  (ALDACTONE ) 25 MG tablet Take 1 tablet (25 mg total) by mouth daily. 12/13/23   Mayte Diers, Newman PARAS, MD    Scheduled Meds:  Continuous Infusions:  PRN Meds:   Allergies:    Allergies  Allergen Reactions   Prednisone Palpitations   Fentanyl  Swelling and Other (See Comments)    Makes pt hyper feels she is climbing the walls.  Last time she had a colonoscopy with Fentanyl  and Versed  she was awake the whole time.   Versed  [Midazolam ] Swelling and Anxiety    Makes pt hyper feels she is climbing the walls.  Last time she had a colonoscopy with Fentanyl  and Versed  she was awake the whole time.   Vicodin [Hydrocodone -Acetaminophen ] Anxiety    Extreme anxiety.  OK to take oxycodone .    Social History:   Social History   Socioeconomic History   Marital status: Widowed    Spouse name: Not on file   Number of children: 2    Years of education: Not on file   Highest education level: Not on file  Occupational History   Occupation: server    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  Tobacco Use   Smoking status: Former    Current packs/day: 0.00    Average packs/day: 0.8 packs/day for 20.0 years (15.0 ttl  pk-yrs)    Types: Cigarettes    Start date: 05/06/1977    Quit date: 05/06/1997    Years since quitting: 26.8   Smokeless tobacco: Never  Vaping Use   Vaping status: Never Used  Substance and Sexual Activity   Alcohol use: Yes    Alcohol/week: 2.0 standard drinks of alcohol    Types: 2 Standard drinks or equivalent per week    Comment: 2-3 drinks per week usually   Drug use: No   Sexual activity: Not Currently  Other Topics Concern   Not on file  Social History Narrative   6 years of college   Social Drivers of Health   Financial Resource Strain: Low Risk  (03/10/2023)   Received from [facility]   Overall Financial Resource Strain (CARDIA)    Difficulty of Paying Living Expenses: Not hard at all  Food Insecurity: No Food Insecurity (03/10/2023)   Received from [facility]   Hunger Vital Sign    Within the past 12 months, you worried that your food would run out before you got the money to buy more.: Never true    Within the past 12 months, the food you bought just didn't last and you didn't have money to get more.: Never true  Transportation Needs: No Transportation Needs (03/10/2023)   Received from [facility] - Transportation    Lack of Transportation (Medical): No    Lack of Transportation (Non-Medical): No  Physical Activity: Not on file  Stress: Not on file  Social Connections: Unknown (03/05/2023)   Received from [facility]   Social Network    Social Network: Not on file  Intimate Partner Violence: Unknown (03/05/2023)   Received from [facility]   HITS    Physically Hurt: Not on file    Insult or Talk Down To: Not on file    Threaten Physical Harm: Not on file    Scream  or Curse: Not on file    Family History:   Family History  Problem Relation Age of Onset   Colon cancer Mother 46   Heart disease Father        heart attack   Colon polyps Brother        one brother had large colon polyp that needed surgery to be removed   Heart disease Paternal Grandfather    Diabetes type II Brother      ROS:  Please see the history of present illness.  All other ROS reviewed and negative.     Physical Exam/Data: Vitals:   02/29/24 0300 02/29/24 0400 02/29/24 0500 02/29/24 0520  BP: 112/69 95/62 (!) 105/93   Pulse: 80 74 75   Resp: 20 20 15    Temp:    97.9 F (36.6 C)  TempSrc:    Oral  SpO2: 99% 94% 100%   Weight:      Height:       No intake or output data in the 24 hours ending 02/29/24 0534    02/29/2024   12:01 AM 02/25/2024    1:15 PM 02/22/2024    2:51 PM  Last 3 Weights  Weight (lbs) 192 lb 14.4 oz 193 lb 196 lb 13.9 oz  Weight (kg) 87.5 kg 87.544 kg 89.3 kg     Body mass index is 29.33 kg/m.  General:  Well nourished, well developed, in no acute distress, mildly anxious HEENT: normal Neck: no JVD Vascular: No carotid bruits; Distal pulses 2+ bilaterally Cardiac:  normal S1,  S2; RRR; no murmur Lungs:  clear to auscultation bilaterally, no wheezing, rhonchi or rales  Abd: soft, nontender, no hepatomegaly  Ext: no edema Musculoskeletal:  No deformities, BUE and BLE strength normal and equal Skin: warm and dry  Neuro:  CNs 2-12 intact, no focal abnormalities noted Psych:  Normal affect   EKG:  The EKG was personally reviewed and demonstrates:  pending Telemetry:  Telemetry was personally reviewed and demonstrates:  sinus rhythm, no arrhythmias  Relevant CV Studies: Coronary Angiogram  02/02/2023   Aggressive GDMT for HFrEF   Right and left heart catheterization 02/02/2023: LM: Normal LAD: [place] mid LAD stent with no restenosis.stent thrombosis Lcx: Mid diffuse 50% disease  [place]amus: Patent stent. No restenosis RCA: Mid eccentric 40%  disease. Rest mild diffuse disease   RA: 21 mmHg PA: 50/29 mmHg, mPAP 38 mmHg PCW: 29 mmHg   LV: 127/12 mmHg Ao: 102/77 mmHg CO: 5.5 L/min CI: 2.7 L/min/m2   Swan catheter left in place. IV lasix  20X2 administered in cath lab. Continue IV amiodarone . Needs aggressive HFrEF management. I had a long conversation with patient and family regarding importance of HFrEF management. Will get heart failure team consult.  01/31/2023 LM: Normal LAD: 95% mid LAD stenosis with mild calcification and moderate tortuosity (New since 2021)          40% downstream lesion in mid LAD (New since 2021)          Otherwise mild diffuse disease Lcx: Mid diffuse 50% disease (Previously deemed 80% in 2021) Ramus: Patent stent. No restenosis RCA: Not engaged today  Successful percutaneous coronary intervention mid LAD     Aspiration thrombectomy     IVUS guided balloon dilatation with 3.25 and 3.5 mm [place] balloons up to 20 atm 0% residual stenosis at the stented segment. IVUS MSA 8.1 mm2, prior to final 3.5 [place] balloon expansion at 20 atm  Coronary intervention 01/29/2023: (Please see diagnostic coronary angiogram report 01/27/2023) Mid LAD 95% stenosis Successful percutaneous coronary intervention mid LAD        PTCA and stent placement 3.0 X 22 mm Onyx Frontier drug-eluting stent        Post dilatation using 3.25 X 12 mm Franklin balloon up tp 18 atm 0% residual stenosis, TIMI flow III  01/27/2023 LM: Normal LAD: 95% mid LAD stenosis with mild calcification and moderate tortuosity (New since 2021)          40% downstream lesion in mid LAD (New since 2021)          Otherwise mild diffuse disease Lcx: Mid diffuse 50% disease (Previously deemed 80% in 2021) Ramus: Patent stent. No restenosis RCA: Mid eccentric 40% disease. Rest mild diffuse disease   RA: 4 mmHg RV: 31/2 mmHg PA: 23/10 mmHg, mPAP 14 mmHg PCW: 4 mmHg   LV: 132/0 mmHg Ao: 101/59 mmHg CO: 5.5 L/min CI: 2.7 L/min/m2   AoV mean PG 29  mmHg AVA 1.0 cm2, AVAi 0.5 cm2/.m2   Exertional angina and dyspnea Single vessel obstructive disease  Compensated ischemic or valvular cardiomyopathy (EF 30-35% with akinetic apex and septum on echocardiogram) Probable low flow low gradient severe AS GDMT for HFrEF likely limited by low blood pressure in the setting of severe AS   Will discuss with multidisciplinary heart team re: GDMT for HFrEF + PCI and TAVR vs CABG (LIMA-LAD) + AVR   TTE 02/14/2024 IMPRESSIONS   1. Left ventricular ejection fraction, by estimation, is 35%. The left  ventricle has normal function. The  left ventricle demonstrates regional  wall motion abnormalities (see scoring diagram/findings for description).  The left ventricular internal cavity   size was moderately to severely dilated. There is moderate left  ventricular hypertrophy. Left ventricular diastolic parameters are  indeterminate.   2. Right ventricular systolic function is normal. The right ventricular  size is normal.   3. Left atrial size was moderately dilated.   4. The mitral valve is abnormal. Trivial mitral valve regurgitation. No  evidence of mitral stenosis. Moderate mitral annular calcification.   5. 26 mm Sapien TAVR valve no PVL mean gradient 6 peak 10.3 mmHg AVA 2.2  cm2. The aortic valve has been repaired/replaced. There is mild  calcification of the aortic valve. There is mild thickening of the aortic  valve. Aortic valve regurgitation is not  visualized. Aortic valve sclerosis is present, with no evidence of aortic  valve stenosis. There is a 26 mm Edwards Sapien prosthetic (TAVR) valve  present in the aortic position. Procedure Date: 02/04/2023.   6. The inferior vena cava is normal in size with greater than 50%  respiratory variability, suggesting right atrial pressure of 3 mmHg.    Laboratory Data: High Sensitivity Troponin:   Recent Labs  Lab 02/29/24 0004 02/29/24 0217  TROPONINIHS 20* 27*     Chemistry Recent Labs   Lab 02/29/24 0004  NA 136  K 4.0  CL 102  CO2 24  GLUCOSE 106*  BUN 18  CREATININE 1.05*  CALCIUM  9.2  GFRNONAA 57*  ANIONGAP 10   Hematology Recent Labs  Lab 02/29/24 0004  WBC 10.3  RBC 5.00  HGB 13.2  HCT 41.9  MCV 83.8  MCH 26.4  MCHC 31.5  RDW 17.8*  PLT 447*   Radiology/Studies:  DG Chest 2 View Result Date: 02/29/2024 EXAM: 2 VIEW(S) XRAY OF THE CHEST 02/29/2024 12:24:00 AM COMPARISON: 11/10/2023 CLINICAL HISTORY: Chest palpitations. 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 Has a referral for a defib put in. Hr 90s, no longer feels palpitations with EMS; 20g LAC; 156/80; 98%. FINDINGS: LUNGS AND PLEURA: Left basilar atelectasis. No pleural effusion or pneumothorax. HEART AND MEDIASTINUM: Stable cardiomediastinal silhouette. TAVR. Coronary stenting. Aortic atherosclerotic calcification. BONES AND SOFT TISSUES: No acute osseous abnormality. IMPRESSION: 1. No acute findings. Electronically signed by: Norman Gatlin MD 02/29/2024 12:34 AM EDT RP Workstation: [id number]   Assessment and Plan: Palpitations  Chronic systolic and diastolic heart failure with reduced EF Severe AS s/p TAVR 2024 Elevated troponin CAD with VF arrest in 01/2023 due to acute stent thrombosis DVT (left internal jugular) s/p DOAC Etiology of palpitations seem to be sinus tachycardia in nature. The patient states she has been very anxious over the weekend since learning that she will be evaluated for an ICD as her LVEF remains ~35%. Telemetry strips from EMS look to be sinus tachycardia with a 1st degree AV block. She now states she feels back to her baseline but does have some residual anxiety and chest tightness which attributes to anxiety. We will monitor her here on telemetry and send her home on a Holter monitor. - Trend troponin until peak  - Monitor on telemetry  - Holter at discharge - Follow-up with EP for secondary prevention ICD - Continue  Plavix  - Continue GDMT with losartan , bisoprolol , spironolactone , dapaglifozin - Continue rosuvastatin    Risk Assessment/Risk Scores:    New [place]  Heart Association (NYHA) Functional Class NYHA Class I   For questions or updates, please contact  [facility] Please consult [email or web address] for contact info under   Signed, Madison DELENA Orchard, MD  02/29/2024 5:34 AM  ------------------------------------------------------------------------------------------------------------------------  Addendum:  68 y.o. female with  hyperlipidemia, CAD, HFrEF, h/o DVT, carotid artery disease s/p Rt CEA, h/o breast cancer  Clinically euvolemic, but EF remains low at 35%.  Recent office visit on 02/25/2024, I recommended addition of bisoprolol , and referral to EP for consideration of ICD.  Patient has been very anxious and nervous since then.  She presented to ER with palpitations.  Telemetry shows only sinus tachycardia, without any significant arrhythmia.  Troponin mildly elevated at 20 and 27.  No ischemia on EKG.  Recent stress test in 12/2023 showed infarct without any significant ischemia.  Palpitations: Likely sinus tachycardia driven primarily by anxiety. She remains at risk for ventricular arrhythmias.  Therefore, reasonable to monitor overnight. She now started taking bisoprolol  2.5 mg daily, which will be started inpatient.  Troponin elevation: Mild troponin elevation likely secondary to underlying cardiomyopathy and episode of sinus tachycardia. Do not suspect ACS. I do not think she needs repeat heart catheterization at this time.   CAD: PCI to LAD, complicated by stent thrombosis while on Plavix  in the setting of severe AS and HFrEF, leading to VF arrest, treated with thrombectomy and balloon dilatation with excellent results (01/2023). Plavix  was chosen as P2 Y12 inhibitor of choice given concurrent use of anticoagulation given diagnosis DVT then. No anginal symptoms at this  time. Completed 1 year DAPT with aspirin  and Plavix . Continue Plavix  monotherapy. Currently on Crestor  10 mg daily, has been reluctant to uptitrate statin therapy. Will uptitrate outpatient.   HFrEF: EF 35% with regional wall motion abnormalities likely related to prior MI.   Clinically, she has NYHA class II symptoms, appears fairly euvolemic with only mildly elevated proBNP.   She has been extremely intolerant to several GDMT medications, currently tolerating losartan  25 mg daily, spironolactone  25 mg daily, Farxiga  10 mg daily, bisoprolol  2.5 mg daily. Outpatient EP referral pending for consideration for ICD.  [name], MD

## 2024-02-29 NOTE — H&P (Signed)
 History and Physical    Madison West FMW:[id number] DOB: [birth year] DOA: 02/28/2024  PCP: Teresa Aldona CROME, NP   Patient coming from:Home Chief Complaint  Patient presents with   Palpitations     YEP:Madison West is a 68 y.o. year old female with PMH of CAD with V-fib arrest in 01/2023 due to acute in-stent thrombosis, severe AS s/p TAV, HFrEF, h/o DVT, CAD s/p Rt CEA, history of breast cancer, HLD presented for evaluation of palpitation at work.  Patient heart rate was up to 136 for several hours 2.5 hr, while checked by her pulse oximetry.she was brought to the ED by EMS, rhythm strip from EMS showed sinus tachycardia with first-degree block.  Of note she has history of 6+ week stay in the ICU for cardiogenic shock, B fever arrest and stent thrombosis, required IABP and discussion ongoing regarding secondary prevention ICD as her LVEF remains low despite GDMT Patient otherwise denies any nausea, vomiting, chest pain, shortness of breath, fever, chills, headache, focal weakness, numbness tingling, speech difficulties. She was anxious and scared and felt some tightness at that time and her recent discussion about possible need for ICD was making her anxious too.  In the ED -vitals fairly stable blood pressure 90s-105, heart rate stable troponin 20> 27 CBC BMP otherwise unremarkable CXR> no acute finding Cardiology consulted and admitted.    Assessment and plan:  Palpitation/sinus tachycardia Hx of V-fib arrest in 01/2023 due to acute in-stent thrombosis: Etiology of palpitation appears to be sinus tachycardia, cardiology has been consulted given her low EF await further recommendation. There was discussion ongoing regarding secondary prevention of ICD. Likely needs Holter at discharge.  Severe AS s/p TAVR: Stable.  CAD HLD  Mild troponin leak likely demand ischemia from palpitation: No delta on troponin leak.  Continue patient's cardiac meds antiplatelets and  statin-  HFrEF: Euvolemic.  Continue home GDMT with losartan  bisoprolol  Aldactone  Farxiga .  h/o DVT: Was on eliquis  briefly and taken off  Carotid artery disease s/p Rt CEA Cont home meds  history of breast cancer   DVT prophylaxis: enoxaparin  (LOVENOX ) injection 40 mg Start: 02/29/24 0900 Code Status:   Code Status: Full Code Family Communication: plan of care discussed with patient at bedside. Patient status is: Remains hospitalized because of severity of illness Level of care: Telemetry Cardiac   Dispo: The patient is from: home            Anticipated disposition: TBD Objective: Vitals last 24 hrs: Vitals:   02/29/24 0400 02/29/24 0500 02/29/24 0520 02/29/24 0821  BP: 95/62 (!) 105/93    Pulse: 74 75    Resp: 20 15    Temp:   97.9 F (36.6 C)   TempSrc:   Oral   SpO2: 94% 100%  99%  Weight:      Height:        Physical Examination: General exam: alert awake, oriented, older than stated age HEENT:Oral mucosa moist, Ear/Nose WNL grossly Respiratory system: Bilaterally clear BS,no use of accessory muscle Cardiovascular system: S1 & S2 +, No JVD. Gastrointestinal system: Abdomen soft,NT,ND, BS+ Nervous System: Alert, awake, moving all extremities,and following commands. Extremities: LE edema neg, distal extremities warm.  Skin: No rashes,no icterus. MSK: Normal muscle bulk,tone, power   Medications reviewed:  Scheduled Meds:  alprazolam   0.25-2 mg Oral See admin instructions   Bisoprolol  Fumarate  2.5 mg Oral Daily   busPIRone   5-10 mg Oral Daily   clopidogrel   75 mg Oral Daily  dapagliflozin  propanediol  10 mg Oral QAC breakfast   enoxaparin  (LOVENOX ) injection  40 mg Subcutaneous Q24H   losartan   25 mg Oral Daily   rosuvastatin   10 mg Oral Daily   spironolactone   25 mg Oral Daily   Continuous Infusions: Diet: Diet Order             Diet Heart Room service appropriate? Yes; Fluid consistency: Thin  Diet effective now                      Severity of Illness: The appropriate patient status for this patient is OBSERVATION. Observation status is judged to be reasonable and necessary in order to provide the required intensity of service to ensure the patient's safety. The patient's presenting symptoms, physical exam findings, and initial radiographic and laboratory data in the context of their medical condition is felt to place them at decreased risk for further clinical deterioration. Furthermore, it is anticipated that the patient will be medically stable for discharge from the hospital within 2 midnights of admission.   Family Communication: Admission, patients condition and plan of care including tests being ordered have been discussed with the patient and daughter who indicate understanding and agree with the plan and Code Status.  Consults called:  Cardiology   Review of Systems: All systems were reviewed and were negative except as mentioned in HPI above. Negative for fever Negative for chest pain Negative for shortness of breath  Past Medical History:  Diagnosis Date   Anxiety    Aortic stenosis    s/p TAVR 02/04/23   Arthritis    low back and hip pain intermittent   Breast cancer (HCC) 06/02/2007   r breast -surgery ,radiaology. chemotherapy   Carotid artery disease (HCC)    Carpal tunnel syndrome    right hand   CHF (congestive heart failure) (HCC)    Colon polyps    Complication of anesthesia    Fentanyl , Versed -makes extra hyper, bradycardia x 1 in PACU, [facility] (08/15/11 cardiology felt neostigmine may have resulted in AV nodal block)    Coronary artery disease    Depression    denies   DVT (deep venous thrombosis) (HCC)    provoked left IJ 02/10/23   Dysplasia of vulva    Hypertension    Palpitations    PSVT, s/p adenosine  08/04/16   S/P breast lumpectomy 07/04/2007   R breast   S/P radiation therapy 2009   Vulva cancer [facility])     Past Surgical History:  Procedure Laterality Date   APPENDECTOMY      age 64   CARDIAC CATHETERIZATION     CESAREAN SECTION     x 2   COLONOSCOPY W/ POLYPECTOMY     COLONOSCOPY WITH PROPOFOL  N/A 04/27/2016   Procedure: COLONOSCOPY WITH PROPOFOL ;  Surgeon: Gladis MARLA Louder, MD;  Location: [facility] ENDOSCOPY;  Service: Endoscopy;  Laterality: N/A;   CORONARY STENT INTERVENTION N/A 01/29/2023   Procedure: CORONARY STENT INTERVENTION;  Surgeon: Elmira Newman PARAS, MD;  Location: [facility] INVASIVE CV LAB;  Service: Cardiovascular;  Laterality: N/A;   CORONARY THROMBECTOMY N/A 01/31/2023   Procedure: Coronary Thrombectomy;  Surgeon: Elmira Newman PARAS, MD;  Location: [facility];  Service: Cardiovascular;  Laterality: N/A;   CORONARY ULTRASOUND/IVUS N/A 01/31/2023   Procedure: Coronary Ultrasound/IVUS;  Surgeon: Elmira Newman PARAS, MD;  Location: [facility];  Service: Cardiovascular;  Laterality: N/A;   CORONARY/GRAFT ACUTE MI REVASCULARIZATION N/A 01/31/2023   Procedure: Coronary/Graft  Acute MI Revascularization;  Surgeon: Elmira Newman PARAS, MD;  Location: [facility];  Service: Cardiovascular;  Laterality: N/A;   DILATION AND CURETTAGE OF UTERUS     multiple   ENDARTERECTOMY Right 06/09/2019   ENDARTERECTOMY Right 06/09/2019   Procedure: ENDARTERECTOMY CAROTID RIGHT;  Surgeon: Harvey Carlin BRAVO, MD;  Location: [facility] OR;  Service: Vascular;  Laterality: Right;   IABP INSERTION N/A 02/02/2023   Procedure: IABP Insertion;  Surgeon: Elmira Newman PARAS, MD;  [facility]: [facility];  Service: Cardiovascular;  Laterality: N/A;   INTRAUTERINE DEVICE INSERTION     IUD REMOVAL     LEFT HEART CATH AND CORONARY ANGIOGRAPHY N/A 02/27/2020   Procedure: LEFT HEART CATH AND CORONARY ANGIOGRAPHY;  Surgeon: Ladona Heinz, MD;  [facility]: [facility];  Service: Cardiovascular;  Laterality: N/A;   LEFT HEART CATH AND CORONARY ANGIOGRAPHY N/A 01/31/2023   Procedure: LEFT HEART CATH AND CORONARY ANGIOGRAPHY;  Surgeon: Elmira Newman PARAS, MD;  Location:  [facility];  Service: Cardiovascular;  Laterality: N/A;   MASTECTOMY PARTIAL / LUMPECTOMY W/ AXILLARY LYMPHADENECTOMY Right    lumpectomy and lymph nodes removed   PATCH ANGIOPLASTY Right 06/09/2019   Procedure: Patch Angioplasty of right carotid artery using hemashield paltinum finesse patch;  Surgeon: Harvey Carlin BRAVO, MD;  Location: [facility] OR;  Service: Vascular;  Laterality: Right;   RIGHT AND LEFT HEART CATH N/A 02/01/2023   Procedure: RIGHT AND LEFT HEART CATH;  Surgeon: Elmira Newman PARAS, MD;  [facility]: [facility];  Service: Cardiovascular;  Laterality: N/A;   RIGHT HEART CATH N/A 02/02/2023   Procedure: RIGHT HEART CATH;  Surgeon: Cherrie Toribio SAUNDERS, MD;  [facility]: [facility];  Service: Cardiovascular;  Laterality: N/A;   RIGHT/LEFT HEART CATH AND CORONARY ANGIOGRAPHY N/A 01/27/2023   Procedure: RIGHT/LEFT HEART CATH AND CORONARY ANGIOGRAPHY;  Surgeon: Elmira Newman PARAS, MD;  [facility];  Service: Cardiovascular;  Laterality: N/A;   TEE WITHOUT CARDIOVERSION N/A 02/04/2023   Procedure: TRANSESOPHAGEAL ECHOCARDIOGRAM;  Surgeon: Wendel Lurena POUR, MD;  Location: [facility] INVASIVE CV LAB;  Service: Open Heart Surgery;  Laterality: N/A;   TEMPORARY PACEMAKER N/A 02/02/2023   Procedure: TEMPORARY PACEMAKER;  Surgeon: Cherrie Toribio SAUNDERS, MD;  [facility];  Service: Cardiovascular;  Laterality: N/A;   TRANSCATHETER AORTIC VALVE REPLACEMENT, TRANSFEMORAL N/A 02/04/2023   Procedure: Transcatheter Aortic Valve Replacement, Transfemoral;  Surgeon: Thukkani, Arun K, MD;  [facility]: [facility];  Service: Open Heart Surgery;  Laterality: N/A;   TUBAL LIGATION     VIDEO BRONCHOSCOPY Bilateral 11/10/2023   Procedure: VIDEO BRONCHOSCOPY WITHOUT FLUORO;  Surgeon: Mannam, Praveen, MD;  Location: [facility];  Service: Cardiopulmonary;  Laterality: Bilateral;   VIDEO BRONCHOSCOPY WITH ENDOBRONCHIAL ULTRASOUND N/A 11/10/2023   Procedure:  BRONCHOSCOPY, WITH EBUS;  Surgeon: Theophilus Roosevelt, MD;  Location: [facility];  Service: Cardiopulmonary;  Laterality: N/A;   VULVA SURGERY     Multiple times for dysplasia     reports that she quit smoking about 26 years ago. Her smoking use included cigarettes. She started smoking about 46 years ago. She has a 15 pack-year smoking history. She has never used smokeless tobacco. She reports current alcohol use of about 2.0 standard drinks of alcohol per week. She reports that she does not use drugs.  Allergies  Allergen Reactions   Prednisone Palpitations   Fentanyl  Swelling and Other (See Comments)    Makes pt hyper feels she is climbing  the walls.  Last time she had a colonoscopy with Fentanyl  and Versed  she was awake the whole time.   Versed  [Midazolam ] Swelling and Anxiety    Makes pt hyper feels she is climbing the walls.  Last time she had a colonoscopy with Fentanyl  and Versed  she was awake the whole time.   Vicodin [Hydrocodone -Acetaminophen ] Anxiety    Extreme anxiety.  OK to take oxycodone .    Family History  Problem Relation Age of Onset   Colon cancer Mother 28   Heart disease Father        heart attack   Colon polyps Brother        one brother had large colon polyp that needed surgery to be removed   Heart disease Paternal Grandfather    Diabetes type II Brother      Prior to Admission medications   Medication Sig Start Date End Date Taking? Authorizing Provider  acetaminophen  (TYLENOL ) 325 MG tablet Take 2 tablets (650 mg total) by mouth every 6 (six) hours. Patient taking differently: Take 650 mg by mouth every 4 (four) hours as needed for moderate pain (pain score 4-6). 03/05/23  Yes Setzer, Sandra J, PA-C  alprazolam  (XANAX ) 2 MG tablet Take 0.25-2 mg by mouth See admin instructions. Take 1 mg (may take up to 2 mg) twice daily. May also take 0.25 mg during the day if needed for anxiety.   Yes [provider]  amoxicillin  (AMOXIL ) 500 MG tablet  Take 4 tablets (2,000 mg total) by mouth as directed. 1 HOUR PRIOR TO DENTAL PROCEDURES, INCLUDING CLEANINGS 02/14/24  Yes Sebastian Lamarr SAUNDERS, PA-C  busPIRone  (BUSPAR ) 10 MG tablet TAKE 1 TABLET BY MOUTH TWICE A DAY Patient taking differently: Take 5-10 mg by mouth daily. 06/29/23  Yes Raulkar, Sven SQUIBB, MD  clopidogrel  (PLAVIX ) 75 MG tablet Take 1 tablet (75 mg total) by mouth daily. 12/13/23  Yes Patwardhan, Newman PARAS, MD  dapagliflozin  propanediol (FARXIGA ) 10 MG TABS tablet Take 1 tablet (10 mg total) by mouth daily before breakfast. 12/13/23  Yes Patwardhan, Manish J, MD  furosemide  (LASIX ) 20 MG tablet Take 0.5 tablets (10 mg total) by mouth daily as needed (for lower extremity swelling and weight gain). 12/13/23  Yes Patwardhan, Manish J, MD  losartan  (COZAAR ) 25 MG tablet Take 1 tablet (25 mg total) by mouth daily. 12/13/23  Yes Patwardhan, Manish J, MD  methocarbamol  (ROBAXIN ) 500 MG tablet Take 1 tablet (500 mg total) by mouth 2 (two) times daily. Patient taking differently: Take 500 mg by mouth 2 (two) times daily as needed for muscle spasms. 06/04/23  Yes Raulkar, Sven SQUIBB, MD  nitroGLYCERIN  (NITROSTAT ) 0.4 MG SL tablet Place 1 tablet (0.4 mg total) under the tongue every 5 (five) minutes as needed for chest pain. 03/15/23  Yes Raulkar, Sven SQUIBB, MD  oxyCODONE  (ROXICODONE ) 5 MG immediate release tablet Take 1 tablet (5 mg total) by mouth daily as needed for severe pain (pain score 7-10). 07/05/23  Yes Raulkar, Sven SQUIBB, MD  rosuvastatin  (CRESTOR ) 10 MG tablet Take 1 tablet (10 mg total) by mouth daily. 12/13/23  Yes Patwardhan, Manish J, MD  spironolactone  (ALDACTONE ) 25 MG tablet Take 1 tablet (25 mg total) by mouth daily. Patient taking differently: Take 12.5-25 mg by mouth daily. 12/13/23  Yes Patwardhan, Manish J, MD  Bisoprolol  Fumarate 2.5 MG TABS Take 2.5 mg by mouth daily. Patient not taking: Reported on 02/29/2024 02/25/24   Elmira Newman PARAS, MD   r   Labs on  Admission: I have  personally reviewed following labs and imaging studies  CBC: Recent Labs  Lab 02/29/24 0004  WBC 10.3  HGB 13.2  HCT 41.9  MCV 83.8  PLT 447*   Basic Metabolic Panel: Recent Labs  Lab 02/29/24 0004  NA 136  K 4.0  CL 102  CO2 24  GLUCOSE 106*  BUN 18  CREATININE 1.05*  CALCIUM  9.2   Recent Labs    02/29/24 0004 02/29/24 0217  TROPONINIHS 20* 27*    BNP (last 3 results) Recent Labs    09/02/23 1417 01/10/24 1504  PROBNP 866* 588*  Thyroid  Function Tests: No results for input(s): TSH, T4TOTAL, FREET4, T3FREE, THYROIDAB in the last 72 hours. Urine analysis:    Component Value Date/Time   COLORURINE STRAW (A) 06/06/2019 1159   APPEARANCEUR Clear 01/10/2024 1503   LABSPEC 1.005 06/06/2019 1159   PHURINE 7.0 06/06/2019 1159   GLUCOSEU 3+ (A) 01/10/2024 1503   HGBUR SMALL (A) 06/06/2019 1159   BILIRUBINUR Negative 01/10/2024 1503   KETONESUR NEGATIVE 06/06/2019 1159   PROTEINUR Negative 01/10/2024 1503   PROTEINUR NEGATIVE 06/06/2019 1159   UROBILINOGEN 0.2 10/04/2013 2039   NITRITE Positive (A) 01/10/2024 1503   NITRITE NEGATIVE 06/06/2019 1159   LEUKOCYTESUR 1+ (A) 01/10/2024 1503   LEUKOCYTESUR NEGATIVE 06/06/2019 1159    Radiological Exams on Admission: DG Chest 2 View Result Date: 02/29/2024 EXAM: 2 VIEW(S) XRAY OF THE CHEST 02/29/2024 12:24:00 AM COMPARISON: 11/10/2023 CLINICAL HISTORY: Chest palpitations. 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 Has a referral for a defib put in. Hr 90s, no longer feels palpitations with EMS; 20g LAC; 156/80; 98%. FINDINGS: LUNGS AND PLEURA: Left basilar atelectasis. No pleural effusion or pneumothorax. HEART AND MEDIASTINUM: Stable cardiomediastinal silhouette. TAVR. Coronary stenting. Aortic atherosclerotic calcification. BONES AND SOFT TISSUES: No acute osseous abnormality. IMPRESSION: 1. No acute findings. Electronically signed by: Norman Gatlin MD 02/29/2024  12:34 AM EDT RP Workstation: [id number]    Mennie LAMY MD [facility]  If 7PM-7AM, please contact night-coverage [email or web address]  02/29/2024, 8:52 AM

## 2024-02-29 NOTE — ED Notes (Signed)
 Pt placed on O2 [place] per home orders. Pt uses 1.5L [place] O2 when sleeping

## 2024-02-29 NOTE — ED Notes (Signed)
 Pt given 2 pillows and 4 heating packs

## 2024-03-01 ENCOUNTER — Encounter (HOSPITAL_COMMUNITY): Payer: Self-pay | Admitting: Internal Medicine

## 2024-03-01 DIAGNOSIS — I5020 Unspecified systolic (congestive) heart failure: Secondary | ICD-10-CM | POA: Diagnosis not present

## 2024-03-01 DIAGNOSIS — R739 Hyperglycemia, unspecified: Secondary | ICD-10-CM

## 2024-03-01 DIAGNOSIS — R002 Palpitations: Secondary | ICD-10-CM | POA: Diagnosis not present

## 2024-03-01 DIAGNOSIS — I2510 Atherosclerotic heart disease of native coronary artery without angina pectoris: Secondary | ICD-10-CM | POA: Diagnosis not present

## 2024-03-01 MED ORDER — BISOPROLOL FUMARATE 2.5 MG PO TABS: 2.5000 mg | ORAL_TABLET | Freq: Every day | ORAL | 3 refills | Status: DC

## 2024-03-01 MED ORDER — POLYETHYLENE GLYCOL 3350 17 G PO PACK
17.0000 g | PACK | Freq: Every day | ORAL | Status: DC
Start: 2024-03-01 — End: 2024-03-01
  Administered 2024-03-01: 17 g via ORAL
  Filled 2024-03-01: qty 1

## 2024-03-01 NOTE — Discharge Summary (Addendum)
 Physician Discharge Summary   Patient: Madison West MRN: [id number] DOB: 10-01-1955  Admit date:     02/28/2024  Discharge date: 03/01/24  Discharge Physician: Sabas GORMAN Brod   PCP: Teresa Aldona CROME, NP   Recommendations at discharge:   Follow-up cardiology as outpatient  Discharge Diagnoses: Principal Problem:   Palpitations  Resolved Problems:   * No resolved hospital problems. *  Hospital Course: [name] is a 68 y.o. year old female with PMH of CAD with V-fib arrest in 01/2023 due to acute in-stent thrombosis, severe AS s/p TAV, HFrEF, h/o DVT, CAD s/p Rt CEA, history of breast cancer, HLD presented for evaluation of palpitation at work.  Patient heart rate was up to 136 for several hours 2.5 hr, while checked by her pulse oximetry.she was brought to the ED by EMS, rhythm strip from EMS showed sinus tachycardia with first-degree block.  Of note she has history of 6+ week stay in the ICU for cardiogenic shock, B fever arrest and stent thrombosis, required IABP and discussion ongoing regarding secondary prevention ICD as her LVEF remains low despite GDMT Patient otherwise denies any nausea, vomiting, chest pain, shortness of breath, fever, chills, headache, focal weakness, numbness tingling, speech difficulties. She was anxious and scared and felt some tightness at that time and her recent discussion about possible need for ICD was making her anxious too.  In the ED -vitals fairly stable blood pressure 90s-105, heart rate stable troponin 20> 27 CBC BMP otherwise unremarkable CXR> no acute finding Cardiology consulted and admitted.    Assessment and plan:  Palpitation/sinus tachycardia Hx of V-fib arrest in 01/2023 due to acute in-stent thrombosis: Etiology of palpitation appears to be sinus tachycardia, cardiology has been consulted given her low EF await further recommendation. -Cardiology was consulted, no further intervention planned -Cardiology has cleared her for  discharge.  Patient has an appointment to see EP cardiology on 9/26  Severe AS s/p TAVR: Stable.  CAD HLD  Mild troponin leak likely demand ischemia from palpitation: No delta on troponin leak.  -No further intervention planned as per cardiology  HFrEF: Euvolemic.  Continue home GDMT with losartan  bisoprolol  Aldactone  Farxiga .  h/o DVT: Was on eliquis  briefly and taken off  Carotid artery disease s/p Rt CEA Cont home meds  history of breast cancer             Consultants: Cardiology Procedures performed:   Disposition: Home Diet recommendation:  Discharge Diet Orders (From admission, onward)     Start     Ordered   03/01/24 0000  Diet - low sodium heart healthy        03/01/24 1535           Regular diet DISCHARGE MEDICATION: Allergies as of 03/01/2024       Reactions   Prednisone Palpitations   Fentanyl  Swelling, Other (See Comments)   Makes pt hyper feels she is climbing the walls.  Last time she had a colonoscopy with Fentanyl  and Versed  she was awake the whole time.   Versed  [midazolam ] Swelling, Anxiety   Makes pt hyper feels she is climbing the walls.  Last time she had a colonoscopy with Fentanyl  and Versed  she was awake the whole time.   Vicodin [hydrocodone -acetaminophen ] Anxiety   Extreme anxiety. OK to take oxycodone .        Medication List     TAKE these medications    acetaminophen  325 MG tablet Commonly known as: TYLENOL  Take 2 tablets (650 mg  total) by mouth every 6 (six) hours. What changed:  when to take this reasons to take this   alprazolam  2 MG tablet Commonly known as: XANAX  Take 0.25-2 mg by mouth See admin instructions. Take 1 mg (may take up to 2 mg) twice daily. May also take 0.25 mg during the day if needed for anxiety.   amoxicillin  500 MG tablet Commonly known as: AMOXIL  Take 4 tablets (2,000 mg total) by mouth as directed. 1 HOUR PRIOR TO DENTAL PROCEDURES, INCLUDING CLEANINGS   Bisoprolol  Fumarate  2.5 MG Tabs Take 2.5 mg by mouth daily.   busPIRone  10 MG tablet Commonly known as: BUSPAR  TAKE 1 TABLET BY MOUTH TWICE A DAY What changed:  how much to take when to take this   clopidogrel  75 MG tablet Commonly known as: PLAVIX  Take 1 tablet (75 mg total) by mouth daily.   dapagliflozin  propanediol 10 MG Tabs tablet Commonly known as: Farxiga  Take 1 tablet (10 mg total) by mouth daily before breakfast.   furosemide  20 MG tablet Commonly known as: Lasix  Take 0.5 tablets (10 mg total) by mouth daily as needed (for lower extremity swelling and weight gain).   losartan  25 MG tablet Commonly known as: COZAAR  Take 1 tablet (25 mg total) by mouth daily.   methocarbamol  500 MG tablet Commonly known as: ROBAXIN  Take 1 tablet (500 mg total) by mouth 2 (two) times daily. What changed:  when to take this reasons to take this   nitroGLYCERIN  0.4 MG SL tablet Commonly known as: NITROSTAT  Place 1 tablet (0.4 mg total) under the tongue every 5 (five) minutes as needed for chest pain.   oxyCODONE  5 MG immediate release tablet Commonly known as: Roxicodone  Take 1 tablet (5 mg total) by mouth daily as needed for severe pain (pain score 7-10).   rosuvastatin  10 MG tablet Commonly known as: CRESTOR  Take 1 tablet (10 mg total) by mouth daily.   spironolactone  25 MG tablet Commonly known as: ALDACTONE  Take 1 tablet (25 mg total) by mouth daily. What changed: how much to take        Follow-up Information     [facility]. Go in 5 day(s).   Specialty: Cardiology Why: Follow up appointment 9/15 @ 9:45 am PLEASE bring a current medication list to appointment FREE valet parking, Entrance C, off [employer] [facility] [address] and [facility] entrance Contact information: [address] [place]  [phone]               Discharge Exam: Fredricka Weights   02/29/24 0001 03/01/24 0434  Weight:  87.5 kg 87 kg   General-appears in no acute distress Heart-S1-S2, regular, no murmur auscultated Lungs-clear to auscultation bilaterally, no wheezing or crackles auscultated Abdomen-soft, nontender, no organomegaly Extremities-no edema in the lower extremities Neuro-alert, oriented x3, no focal deficit noted  Condition at discharge: good  The results of significant diagnostics from this hospitalization (including imaging, microbiology, ancillary and laboratory) are listed below for reference.   Imaging Studies: DG Chest 2 View Result Date: 02/29/2024 EXAM: 2 VIEW(S) XRAY OF THE CHEST 02/29/2024 12:24:00 AM COMPARISON: 11/10/2023 CLINICAL HISTORY: Chest palpitations. 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 Has a referral for a defib put in. Hr 90s, no longer feels palpitations with EMS; 20g LAC; 156/80; 98%. FINDINGS: LUNGS AND PLEURA: Left basilar atelectasis. No pleural effusion or pneumothorax. HEART AND MEDIASTINUM: Stable cardiomediastinal silhouette. TAVR.  Coronary stenting. Aortic atherosclerotic calcification. BONES AND SOFT TISSUES: No acute osseous abnormality. IMPRESSION: 1. No acute findings. Electronically signed by: Norman Gatlin MD 02/29/2024 12:34 AM EDT RP Workstation: [id number]   ECHOCARDIOGRAM COMPLETE Result Date: 02/14/2024    ECHOCARDIOGRAM REPORT   Patient Name:   REBACCA VOTAW Date of Exam: 02/14/2024 Medical Rec #:  [id number]           Height:       68.0 in Accession #:    [id number]          Weight:       194.4 lb Date of Birth:  28-Jun-1955           BSA:          2.019 m Patient Age:    68 years            BP:           134/84 mmHg Patient Gender: F                   HR:           96 bpm. Exam Location:  [address] Procedure: 2D Echo, Cardiac Doppler, Color Doppler and 3D Echo (Both Spectral            and Color Flow Doppler were utilized during procedure). Indications:    CAD Native Vessel I25.10  History:         Patient has prior history of Echocardiogram examinations, most                 recent 07/07/2023. CHF, CAD and Previous Myocardial Infarction,                 Arrythmias:Cardiac Arrest; Risk Factors:Hypertension.                 Aortic Valve: 26 mm Edwards Sapien prosthetic, stented (TAVR)                 valve is present in the aortic position. Procedure Date:                 02/04/2023.  Sonographer:    Augustin Seals RDCS Referring Phys: [id number] [facility] J PATWARDHAN IMPRESSIONS  1. Left ventricular ejection fraction, by estimation, is 35%. The left ventricle has normal function. The left ventricle demonstrates regional wall motion abnormalities (see scoring diagram/findings for description). The left ventricular internal cavity  size was moderately to severely dilated. There is moderate left ventricular hypertrophy. Left ventricular diastolic parameters are indeterminate.  2. Right ventricular systolic function is normal. The right ventricular size is normal.  3. Left atrial size was moderately dilated.  4. The mitral valve is abnormal. Trivial mitral valve regurgitation. No evidence of mitral stenosis. Moderate mitral annular calcification.  5. 26 mm Sapien TAVR valve no PVL mean gradient 6 peak 10.3 mmHg AVA 2.2 cm2. The aortic valve has been repaired/replaced. There is mild calcification of the aortic valve. There is mild thickening of the aortic valve. Aortic valve regurgitation is not visualized. Aortic valve sclerosis is present, with no evidence of aortic valve stenosis. There is a 26 mm Edwards Sapien prosthetic (TAVR) valve present in the aortic position. Procedure Date: 02/04/2023.  6. The inferior vena cava is normal in size with greater than 50% respiratory variability, suggesting right atrial pressure of 3 mmHg. FINDINGS  Left Ventricle: Septal /apical akinesis no mural thrombus. mid/distal anterior and inferior walls hypokinetic Mostly basal function preserved. Left ventricular  ejection fraction, by  estimation, is 35%. The left ventricle has normal function. The left ventricle demonstrates regional wall motion abnormalities. Strain was performed and the global longitudinal strain is indeterminate. The left ventricular internal cavity size was moderately to severely dilated. There is moderate left ventricular hypertrophy. Left ventricular diastolic parameters are indeterminate. Right Ventricle: The right ventricular size is normal. No increase in right ventricular wall thickness. Right ventricular systolic function is normal. Left Atrium: Left atrial size was moderately dilated. Right Atrium: Right atrial size was normal in size. Pericardium: There is no evidence of pericardial effusion. Mitral Valve: The mitral valve is abnormal. There is mild thickening of the mitral valve leaflet(s). There is mild calcification of the mitral valve leaflet(s). Moderate mitral annular calcification. Trivial mitral valve regurgitation. No evidence of mitral valve stenosis. Tricuspid Valve: The tricuspid valve is normal in structure. Tricuspid valve regurgitation is mild . No evidence of tricuspid stenosis. Aortic Valve: 26 mm Sapien TAVR valve no PVL mean gradient 6 peak 10.3 mmHg AVA 2.2 cm2. The aortic valve has been repaired/replaced. There is mild calcification of the aortic valve. There is mild thickening of the aortic valve. Aortic valve regurgitation is not visualized. Aortic valve sclerosis is present, with no evidence of aortic valve stenosis. Aortic valve mean gradient measures 6.0 mmHg. Aortic valve peak gradient measures 10.3 mmHg. Aortic valve area, by VTI measures 2.16 cm. There  is a 26 mm Edwards Sapien prosthetic, stented (TAVR) valve present in the aortic position. Procedure Date: 02/04/2023. Pulmonic Valve: The pulmonic valve was normal in structure. Pulmonic valve regurgitation is not visualized. No evidence of pulmonic stenosis. Aorta: The aortic root is normal in size and structure. Venous: The inferior  vena cava is normal in size with greater than 50% respiratory variability, suggesting right atrial pressure of 3 mmHg. IAS/Shunts: No atrial level shunt detected by color flow Doppler. Additional Comments: 3D was performed not requiring image post processing on an independent workstation and was abnormal.  LEFT VENTRICLE PLAX 2D LVIDd:         4.80 cm   Diastology LVIDs:         4.00 cm   LV e' medial:  6.31 cm/s LV PW:         1.30 cm   LV e' lateral: 6.53 cm/s LV IVS:        1.40 cm LVOT diam:     2.00 cm LV SV:         68 LV SV Index:   34 LVOT Area:     3.14 cm  3D Volume EF:                          3D EF:        35 %                          LV EDV:       218 ml                          LV ESV:       142 ml                          LV SV:        76 ml RIGHT VENTRICLE  IVC RV Basal diam:  3.10 cm     IVC diam: 1.40 cm RV Mid diam:    2.60 cm RV S prime:     12.90 cm/s TAPSE (M-mode): 2.5 cm LEFT ATRIUM           Index        RIGHT ATRIUM           Index LA diam:      4.30 cm 2.13 cm/m   RA Area:     12.70 cm LA Vol (A2C): 55.9 ml 27.68 ml/m  RA Volume:   28.60 ml  14.16 ml/m LA Vol (A4C): 45.8 ml 22.68 ml/m  AORTIC VALVE AV Area (Vmax):    2.19 cm AV Area (Vmean):   2.13 cm AV Area (VTI):     2.16 cm AV Vmax:           160.50 cm/s AV Vmean:          110.000 cm/s AV VTI:            0.314 m AV Peak Grad:      10.3 mmHg AV Mean Grad:      6.0 mmHg LVOT Vmax:         112.00 cm/s LVOT Vmean:        74.500 cm/s LVOT VTI:          0.216 m LVOT/AV VTI ratio: 0.69  AORTA Ao Root diam: 2.30 cm Ao Asc diam:  3.10 cm  SHUNTS Systemic VTI:  0.22 m Systemic Diam: 2.00 cm Maude Emmer MD Electronically signed by Maude Emmer MD Signature Date/Time: 02/14/2024/1:19:04 PM    Final    CT CHEST HIGH RESOLUTION Result Date: 02/07/2024 CLINICAL DATA:  Interstitial lung disease hemoptysis, history of breast and vulvar cancer * Tracking Code: BO * EXAM: CT CHEST WITHOUT CONTRAST TECHNIQUE: Multidetector CT  imaging of the chest was performed following the standard protocol without intravenous contrast. High resolution imaging of the lungs, as well as inspiratory and expiratory imaging, was performed. RADIATION DOSE REDUCTION: This exam was performed according to the departmental dose-optimization program which includes automated exposure control, adjustment of the mA and/or kV according to patient size and/or use of iterative reconstruction technique. COMPARISON:  10/28/2023 FINDINGS: Cardiovascular: Aortic atherosclerosis. Aortic valve stent endograft. Normal heart size. Three-vessel coronary artery calcifications and stents. No pericardial effusion. Mediastinum/Nodes: No enlarged mediastinal, hilar, or axillary lymph nodes. Thyroid  gland, trachea, and esophagus demonstrate no significant findings. Lungs/Pleura: Similar appearance of the lungs with diffuse bilateral ground-glass attenuation of the airspaces with mild, diffuse bilateral bronchial wall thickening and minimal irregular peripheral interstitial opacity. Lobular air trapping again seen on expiratory phase imaging. No significant bronchiectasis, bronchiolectasis, or honeycombing. No pleural effusion or pneumothorax. Upper Abdomen: No acute abnormality. Non nodular adenomatous thickening of the bilateral adrenal glands, benign, requiring no further follow-up or characterization. Musculoskeletal: No chest wall abnormality. No acute osseous findings. IMPRESSION: 1. Similar appearance of the lungs with diffuse bilateral ground-glass attenuation of the airspaces with mild, diffuse bilateral bronchial wall thickening and minimal irregular peripheral interstitial opacity. Lobular air trapping again seen on expiratory phase imaging. No significant bronchiectasis, bronchiolectasis, or honeycombing. Findings are consistent with an alternative diagnosis (not UIP) and suggestive of hypersensitivity pneumonitis. 2. Coronary artery disease. 3. Aortic valve stent  endograft. Aortic Atherosclerosis (ICD10-I70.0). Electronically Signed   By: Marolyn JONETTA Jaksch M.D.   On: 02/07/2024 21:04    Microbiology: Results for orders placed or performed during the hospital encounter of  11/10/23  Fungus Culture With Stain     Status: None   Collection Time: 11/10/23  8:48 AM   Specimen: Bronchial Alveolar Lavage; Respiratory  Result Value Ref Range Status   Fungus Stain Final report  Final   Fungus (Mycology) Culture Final report  Final    Comment: (NOTE) Performed At: [facility] [address] [place], [place] [id number] Jennette Shorter MD Ey:[id number]    Fungal Source BRONCHIAL ALVEOLAR LAVAGE  Final    Comment: Performed at [facility], [address], [place], [place] [address]  Culture, BAL-quantitative w Gram Stain     Status: None   Collection Time: 11/10/23  8:48 AM   Specimen: Bronchial Alveolar Lavage; Respiratory  Result Value Ref Range Status   Specimen Description BRONCHIAL ALVEOLAR LAVAGE  Final   Special Requests NONE  Final   Gram Stain   Final    RARE WBC PRESENT, PREDOMINANTLY MONONUCLEAR NO ORGANISMS SEEN    Culture   Final    NO GROWTH 2 DAYS Performed at [facility], [address], [place], [place] [address]    Report Status 11/12/2023 FINAL  Final  Aerobic/Anaerobic Culture w Gram Stain (surgical/deep wound)     Status: None   Collection Time: 11/10/23  8:48 AM   Specimen: Bronchial Alveolar Lavage; Respiratory  Result Value Ref Range Status   Specimen Description BRONCHIAL ALVEOLAR LAVAGE  Final   Special Requests NONE  Final   Gram Stain   Final    RARE WBC PRESENT, PREDOMINANTLY MONONUCLEAR NO ORGANISMS SEEN    Culture   Final    RARE Consistent with normal respiratory flora. NO ANAEROBES ISOLATED Performed at [facility], [address] [place], [place] [address]    Report Status 11/15/2023 FINAL  Final  Acid Fast Culture with reflexed sensitivities     Status: None   Collection Time:  11/10/23  8:48 AM   Specimen: Bronchial Alveolar Lavage; Respiratory  Result Value Ref Range Status   Acid Fast Culture Negative  Final    Comment: (NOTE) No acid fast bacilli isolated after 6 weeks. Performed At: [facility] [address] [place], [place] [id number] Jennette Shorter MD Ey:[id number]    Source of Sample BRONCHIAL ALVEOLAR LAVAGE  Final    Comment: Performed at [facility], [address], [place], [place] [address]  Acid Fast Smear (AFB)     Status: None   Collection Time: 11/10/23  8:48 AM   Specimen: Bronchial Alveolar Lavage; Respiratory  Result Value Ref Range Status   AFB Specimen Processing Concentration  Final   Acid Fast Smear Negative  Final    Comment: (NOTE) Performed At: [facility] [address] [place], [place] [id number] Jennette Shorter MD Ey:[id number]    Source (AFB) BRONCHIAL ALVEOLAR LAVAGE  Final    Comment: Performed at [facility], [address], [place], [place] [address]  Fungus Culture Result     Status: None   Collection Time: 11/10/23  8:48 AM  Result Value Ref Range Status   Result 1 Comment  Final    Comment: (NOTE) KOH/Calcofluor preparation:  no fungus observed. Performed At: [facility] [address] [place], [place] [id number] Jennette Shorter MD Ey:[id number]   Fungal organism reflex     Status: None   Collection Time: 11/10/23  8:48 AM  Result Value Ref Range Status   Fungal result 1 Comment  Final    Comment: (NOTE) No  yeast or mold isolated after 4 weeks. Performed At: [facility] [address] [place], [place] [id number] Jennette Shorter MD Ey:[id number]   Culture, BAL-quantitative w Gram Stain     Status: None   Collection Time: 11/10/23  8:59 AM   Specimen: Bronchial Alveolar Lavage; Respiratory  Result Value Ref Range Status   Specimen Description BRONCHIAL ALVEOLAR LAVAGE  Final   Special Requests NONE  Final   Gram Stain   Final    RARE WBC PRESENT,  PREDOMINANTLY MONONUCLEAR NO ORGANISMS SEEN    Culture   Final    NO GROWTH 2 DAYS Performed at [facility], [address], [place], [place] [address]    Report Status 11/12/2023 FINAL  Final  Aerobic/Anaerobic Culture w Gram Stain (surgical/deep wound)     Status: None   Collection Time: 11/10/23  8:59 AM   Specimen: Bronchial Alveolar Lavage; Respiratory  Result Value Ref Range Status   Specimen Description BRONCHIAL ALVEOLAR LAVAGE  Final   Special Requests NONE  Final   Gram Stain   Final    RARE WBC PRESENT, PREDOMINANTLY MONONUCLEAR NO ORGANISMS SEEN    Culture   Final    No growth aerobically or anaerobically. Performed at [facility], [address], [place], [place] [address]    Report Status 11/15/2023 FINAL  Final  Acid Fast Smear (AFB)     Status: None   Collection Time: 11/10/23  8:59 AM   Specimen: Bronchial Alveolar Lavage; Respiratory  Result Value Ref Range Status   AFB Specimen Processing Concentration  Final   Acid Fast Smear Negative  Final    Comment: (NOTE) Performed At: [facility] [address] [place], [place] [id number] Jennette Shorter MD Ey:[id number]    Source (AFB) BRONCHIAL ALVEOLAR LAVAGE  Final    Comment: Performed at [facility], [address] [place], [place] [address]    Labs: CBC: Recent Labs  Lab 02/29/24 0004  WBC 10.3  HGB 13.2  HCT 41.9  MCV 83.8  PLT 447*   Basic Metabolic Panel: Recent Labs  Lab 02/29/24 0004  NA 136  K 4.0  CL 102  CO2 24  GLUCOSE 106*  BUN 18  CREATININE 1.05*  CALCIUM  9.2   Liver Function Tests: No results for input(s): AST, ALT, ALKPHOS, BILITOT, PROT, ALBUMIN  in the last 168 hours. CBG: No results for input(s): GLUCAP in the last 168 hours.  Discharge time spent: greater than 30 minutes.  Signed: Sabas GORMAN Brod, MD [facility] 03/01/2024

## 2024-03-01 NOTE — Progress Notes (Signed)
 Heart Failure Nurse Navigator Progress Note  PCP: Teresa Aldona CROME, NP PCP-Cardiologist: Patward[place] Admission Diagnosis: Palpitations, Elevated troponin, Elevated random CBG.  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 Her watch was telling her that her HR was 130 and she felt palpitations.Felt like her heart was beating outside her chest.  She is very anxious, she is supposed to be seeing a cardiac electrophysiologist about possible defibrillator placement because of persistent low ejection fraction. When her heart was beating fast, she did have some sharp chest pains, denies dyspnea or nausea or diaphoresis. Troponin 20 and 27 and 26. Hx of a 6+ week stay here in the ICU for cardiogenic shock and VF arrest after in stent thrombosis of recent DES. She required an IABP. She is being evaluated for secondary prevention ICD as her LVEF remains 35% despite fully tolerated GDMT.   Pateint was educated on the sign and symptoms of heart failure, daily weights, when to call her doctor or go to the ED. Diet/ fluid restrictions,  reported to been drinking around 3 litters of water daily. Continued education on taking all medications as prescribed and attending all medical appointments. Patient verbalized her understanding of education. A HF TOC appointment was scheduled for 03/06/2024 @ 9:45 am.   ECHO/ LVEF: 35%  Clinical Course:  Past Medical History:  Diagnosis Date   Anxiety    Aortic stenosis    s/p TAVR 02/04/23   Arthritis    low back and hip pain intermittent   Breast cancer (HCC) 06/02/2007   r breast -surgery ,radiaology. chemotherapy   Carotid artery disease (HCC)    Carpal tunnel syndrome    right hand   CHF (congestive heart failure) (HCC)    Colon polyps    Complication of anesthesia    Fentanyl , Versed -makes extra hyper, bradycardia x 1 in PACU, [facility]  (08/15/11 cardiology felt neostigmine may have resulted in AV nodal block)    Coronary artery disease    Depression    denies   DVT (deep venous thrombosis) (HCC)    provoked left IJ 02/10/23   Dysplasia of vulva    Hypertension    Palpitations    PSVT, s/p adenosine  08/04/16   S/P breast lumpectomy 07/04/2007   R breast   S/P radiation therapy 2009   Vulva cancer [facility])      Social History   Socioeconomic History   Marital status: Widowed    Spouse name: Not on file   Number of children: 2   Years of education: Not on file   Highest education level: Not on file  Occupational History   Occupation: server    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  Tobacco Use   Smoking status: Former    Current packs/day: 0.00    Average packs/day: 0.8 packs/day for 20.0 years (15.0 ttl pk-yrs)    Types: Cigarettes    Start date: 05/06/1977    Quit date: 05/06/1997    Years since quitting: 26.8   Smokeless tobacco: Never  Vaping Use   Vaping status: Never Used  Substance and Sexual Activity   Alcohol use: Yes    Alcohol/week: 2.0 standard drinks of alcohol    Types: 2 Standard drinks or equivalent per week    Comment: 2-3 drinks per week usually   Drug use: No   Sexual activity: Not Currently  Other Topics Concern  Not on file  Social History Narrative   6 years of college   Social Drivers of Health   Financial Resource Strain: Low Risk  (03/10/2023)   Received from [facility]   Overall Financial Resource Strain (CARDIA)    Difficulty of Paying Living Expenses: Not hard at all  Food Insecurity: No Food Insecurity (03/10/2023)   Received from [facility]   Hunger Vital Sign    Within the past 12 months, you worried that your food would run out before you got the money to buy more.: Never true    Within the past 12 months, the food you bought just didn't last and you didn't have money to get more.: Never true  Transportation Needs: No Transportation Needs (03/10/2023)   Received from  [facility] - Transportation    Lack of Transportation (Medical): No    Lack of Transportation (Non-Medical): No  Physical Activity: Not on file  Stress: Not on file  Social Connections: Unknown (03/05/2023)   Received from [facility]   Social Network    Social Network: Not on file   Education Assessment and Provision:  Detailed education and instructions provided on heart failure disease management including the following:  Signs and symptoms of Heart Failure When to call the physician Importance of daily weights Low sodium diet Fluid restriction Medication management Anticipated future follow-up appointments  Patient education given on each of the above topics.  Patient acknowledges understanding via teach back method and acceptance of all instructions.  Education Materials:  Living Better With Heart Failure Booklet, HF zone tool, & Daily Weight Tracker Tool.  Patient has scale at home: Yes Patient has pill box at home: NA    High Risk Criteria for Readmission and/or Poor Patient Outcomes: Heart failure hospital admissions (last 6 months): 0  No Show rate: 1 Difficult social situation: No, her daughter lives with her.  Demonstrates medication adherence: yes Primary Language: English Literacy level: Reading, writing, and comprehension   Barriers of Care:   Diet/ fluid restrictions   Considerations/Referrals:   Referral made to Heart Failure Pharmacist Stewardship: NA Referral made to Heart Failure CSW/NCM TOC: NA Referral made to Heart & Vascular TOC clinic: Yes, 03/06/2024 @ 9:45 am   Items for Follow-up on DC/TOC: Continued HF education  Diet/ fluid restrictions/ daily weights   Stephane Haddock, BSN, RN Heart Failure Teacher, adult education Only

## 2024-03-01 NOTE — Care Management Obs Status (Signed)
 MEDICARE OBSERVATION STATUS NOTIFICATION   Patient Details  Name: Madison West MRN: [id number] Date of Birth: 08-05-1955   Medicare Observation Status Notification Given:  Yes    Vonzell Arrie Sharps 03/01/2024, 8:19 AM

## 2024-03-01 NOTE — TOC CM/SW Note (Addendum)
 Transition of Care [facility]) - Inpatient Brief Assessment   Patient Details  Name: Madison West MRN: [id number] Date of Birth: 31-Mar-1956  Transition of Care [facility]) CM/SW Contact:    Waddell Barnie Rama, RN Phone Number: 03/01/2024, 11:05 AM   Clinical Narrative: From home , daughter lives with her, has PCP and insurance on file, states has no HH services in place at this time, has home oxygen 1.5 liters at night while sleeping thru [employer] at home.  States family member (daughter)  will transport them home at [employer] and family is support system, states gets medications from [place] mail order and [facility] in [place].  Pta self ambulatory.  Patient still works Mon- Fri, not homebound.   There are no ICM ( Inpatient Case Management) needs identified  at this time.  Please place consult for any  ICM (Inpatient Case Management)  needs.     Transition of Care Asessment: Insurance and Status: Insurance coverage has been reviewed Patient has primary care physician: Yes Home environment has been reviewed: home ,her daughter lives with her Prior level of function:: indep Prior/Current Home Services: Current home services (home oxygen 1.5 liters [employer], at night when sleeping, has a portable one as well.) Social Drivers of Health Review: SDOH reviewed no interventions necessary Readmission risk has been reviewed: Yes Transition of care needs: no transition of care needs at this time

## 2024-03-01 NOTE — TOC Transition Note (Signed)
 Transition of Care [facility]) - Discharge Note   Patient Details  Name: Madison West MRN: [id number] Date of Birth: 03-Feb-1956  Transition of Care [facility]) CM/SW Contact:  Waddell Barnie Rama, RN Phone Number: 03/01/2024, 3:55 PM   Clinical Narrative:    For dc today, has no needs.          Patient Goals and CMS Choice            Discharge Placement                       Discharge Plan and Services Additional resources added to the After Visit Summary for                                       Social Drivers of Health (SDOH) Interventions SDOH Screenings   Food Insecurity: No Food Insecurity (03/10/2023)   Received from [facility]  Housing: Unknown (03/01/2024)  Transportation Needs: No Transportation Needs (03/01/2024)  Utilities: Not At Risk (03/10/2023)   Received from [facility]  Alcohol Screen: Low Risk  (03/01/2024)  Depression (PHQ2-9): Low Risk  (12/01/2023)  Financial Resource Strain: Low Risk  (03/01/2024)  Social Connections: Unknown (03/05/2023)   Received from [facility]  Tobacco Use: Medium Risk (02/29/2024)     Readmission Risk Interventions     No data to display

## 2024-03-01 NOTE — Progress Notes (Signed)
 [facility]  PROGRESS NOTE  Madison West FMW:[id number] DOB: [birth year] DOA: 02/28/2024 PCP: Teresa Aldona CROME, NP   Brief HPI:    68 y.o. year old female with PMH of CAD with V-fib arrest in 01/2023 due to acute in-stent thrombosis, severe AS s/p TAV, HFrEF, h/o DVT, CAD s/p Rt CEA, history of breast cancer, HLD presented for evaluation of palpitation at work.  Patient heart rate was up to 136 for several hours 2.5 hr, while checked by her pulse oximetry.she was brought to the ED by EMS, rhythm strip from EMS showed sinus tachycardia with first-degree block.  Of note she has history of 6+ week stay in the ICU for cardiogenic shock, B fever arrest and stent thrombosis, required IABP and discussion ongoing regarding secondary prevention ICD as her LVEF remains low despite GDMT Patient otherwise denies any nausea, vomiting, chest pain, shortness of breath, fever, chills, headache, focal weakness, numbness tingling, speech difficulties. She was anxious and scared and felt some tightness at that time and her recent discussion about possible need for ICD was making her anxious too.  In the ED -vitals fairly stable blood pressure 90s-105, heart rate stable troponin 20> 27 CBC BMP otherwise unremarkable CXR> no acute finding Cardiology consulted and admitted.    Assessment/Plan:   Palpitation/sinus tachycardia Hx of V-fib arrest in 01/2023 due to acute in-stent thrombosis: Etiology of palpitation appears to be sinus tachycardia, cardiology consulted - Echocardiogram showed EF of 35% with moderately enlarged left ventricular cavity -Plan for likely ICD placement Likely needs Holter at discharge.   Severe AS s/p TAVR: Stable.   CAD HLD  Mild troponin leak likely demand ischemia from palpitation: No delta on troponin leak.   Continue patient's cardiac meds antiplatelets and statin   HFrEF: Euvolemic.   Continue home GDMT with losartan  bisoprolol  Aldactone  Farxiga .   h/o DVT: Was on  eliquis  briefly and taken off   Carotid artery disease s/p Rt CEA Cont home meds   history of breast cancer noted   Medications     bisoprolol   2.5 mg Oral Daily   busPIRone   5-10 mg Oral Daily   clopidogrel   75 mg Oral Daily   dapagliflozin  propanediol  10 mg Oral QAC breakfast   enoxaparin  (LOVENOX ) injection  40 mg Subcutaneous Q24H   losartan   25 mg Oral Daily   rosuvastatin   10 mg Oral Daily   spironolactone   25 mg Oral Daily     Data Reviewed:   CBG:  No results for input(s): GLUCAP in the last 168 hours.  SpO2: 92 % O2 Flow Rate (L/min): 1.5 L/min    Vitals:   02/29/24 1941 02/29/24 2356 03/01/24 0434 03/01/24 0732  BP:  112/76 101/70 129/71  Pulse:  69 64 66  Resp: 18 17 17 18   Temp: 98.2 F (36.8 C) 98 F (36.7 C) 97.6 F (36.4 C) 97.6 F (36.4 C)  TempSrc: Oral Oral Oral Oral  SpO2:  95% 98% 92%  Weight:   87 kg   Height:          Data Reviewed:  Basic Metabolic Panel: Recent Labs  Lab 02/29/24 0004  NA 136  K 4.0  CL 102  CO2 24  GLUCOSE 106*  BUN 18  CREATININE 1.05*  CALCIUM  9.2    CBC: Recent Labs  Lab 02/29/24 0004  WBC 10.3  HGB 13.2  HCT 41.9  MCV 83.8  PLT 447*    LFT No results for input(s): AST, ALT, ALKPHOS,  BILITOT, PROT, ALBUMIN  in the last 168 hours.   Antibiotics: Anti-infectives (From admission, onward)    None        DVT prophylaxis: Lovenox   Code Status: Full code  Family Communication: No family at bedside   CONSULTS cardiology   Subjective   Denies chest pain or shortness of breath   Objective    Physical Examination:  Appears in no acute distress S1-S2, regular, no murmur auscultated Abdomen is soft, nontender, no organomegaly Lungs are clear to auscultation bilaterally Extremities no edema  Status is: Inpatient:             Sabas GORMAN Brod   [facility] If 7PM-7AM, please contact night-coverage at [email or web address], Office   [phone]   03/01/2024, 7:37 AM  LOS: 0 days

## 2024-03-01 NOTE — Progress Notes (Signed)
   Patient Name: Madison West Date of Encounter: 03/01/2024 [facility] Cardiologist: [name], MD   Interval Summary  .    No overnight no acute events  Vital Signs .    Vitals:   02/29/24 2356 03/01/24 0434 03/01/24 0732 03/01/24 1141  BP: 112/76 101/70 129/71 114/67  Pulse: 69 64 66 62  Resp: 17 17 18 20   Temp: 98 F (36.7 C) 97.6 F (36.4 C) 97.6 F (36.4 C) 97.7 F (36.5 C)  TempSrc: Oral Oral Oral Oral  SpO2: 95% 98% 92% 91%  Weight:  87 kg    Height:        Intake/Output Summary (Last 24 hours) at 03/01/2024 1213 Last data filed at 03/01/2024 0911 Gross per 24 hour  Intake 460 ml  Output --  Net 460 ml      03/01/2024    4:34 AM 02/29/2024   12:01 AM 02/25/2024    1:15 PM  Last 3 Weights  Weight (lbs) 191 lb 12.8 oz 192 lb 14.4 oz 193 lb  Weight (kg) 87 kg 87.5 kg 87.544 kg      Telemetry/ECG    03/01/2024- Personally Reviewed No significant arrhythmia  Physical Exam .   Physical Exam Vitals and nursing note reviewed.  Constitutional:      General: She is not in acute distress. Neck:     Vascular: No JVD.  Cardiovascular:     Rate and Rhythm: Normal rate and regular rhythm.     Heart sounds: Normal heart sounds. No murmur heard. Pulmonary:     Effort: Pulmonary effort is normal.     Breath sounds: Normal breath sounds. No wheezing or rales.  Musculoskeletal:     Right lower leg: No edema.     Left lower leg: No edema.      Assessment & Plan .     68 y.o. female with  hyperlipidemia, CAD, HFrEF, h/o DVT, carotid artery disease s/p Rt CEA, h/o breast cancer  Palpitations: Sinus tachycardia presentation, negligible anxiety. No significant arrhythmia noted.  Troponin elevation: Mild troponin elevation likely secondary to underlying cardiomyopathy and episode of sinus tachycardia. Do not suspect ACS. I do not think she needs repeat heart catheterization at this time.   CAD: PCI to LAD, complicated by stent  thrombosis while on Plavix  in the setting of severe AS and HFrEF, leading to VF arrest, treated with thrombectomy and balloon dilatation with excellent results (01/2023). Completed 1 year DAPT, now on Plavix  monotherapy. She had been taking Crestor  5 mg daily at home, now is agreeable to take 10 mg daily, but not keen on any further increasing Crestor  dose, or addition of any other oral or injectable lipid-lowering agents.  HFrEF: EF 35% with regional wall motion abnormalities likely related to prior MI.   Clinically, she has NYHA class II symptoms, appears fairly euvolemic with only mildly elevated proBNP.   She has been extremely intolerant to several GDMT medications, currently tolerating losartan  25 mg daily, spironolactone  25 mg daily, Farxiga  10 mg daily, bisoprolol  2.5 mg daily. Outpatient EP referral pending for consideration for ICD. Scheduled to see Dr. Almetta on 03/17/2024.  Okay to discharge home today from cardiac standpoint.  For questions or updates, please contact [facility] Please consult [email or web address] for contact info under        Signed, [name], MD

## 2024-03-01 NOTE — Plan of Care (Signed)
  Problem: Clinical Measurements: Goal: Will remain free from infection Outcome: Progressing Goal: Respiratory complications will improve Outcome: Progressing Goal: Cardiovascular complication will be avoided Outcome: Progressing   Problem: Activity: Goal: Risk for activity intolerance will decrease Outcome: Progressing   Problem: Coping: Goal: Level of anxiety will decrease Outcome: Progressing   Problem: Elimination: Goal: Will not experience complications related to bowel motility Outcome: Progressing Goal: Will not experience complications related to urinary retention Outcome: Progressing   Problem: Safety: Goal: Ability to remain free from injury will improve Outcome: Progressing   Problem: Cardiac: Goal: Ability to achieve and maintain adequate cardiopulmonary perfusion will improve Outcome: Progressing

## 2024-03-01 NOTE — Progress Notes (Signed)
 Mobility Specialist Progress Note:    03/01/24 1551  Mobility  Activity Ambulated independently  Level of Assistance Independent  Assistive Device None  Distance Ambulated (ft) 500 ft  Activity Response Tolerated well  Mobility Referral Yes  Mobility visit 1 Mobility  Mobility Specialist Start Time (ACUTE ONLY) 1551  Mobility Specialist Stop Time (ACUTE ONLY) 1600  Mobility Specialist Time Calculation (min) (ACUTE ONLY) 9 min   Received pt ambulating in room. No c/o any symptoms. Pt moving and ambulating well. Left pt in room w/ all needs met.   Venetia Keel Mobility Specialist Please Neurosurgeon or Rehab Office at [phone]

## 2024-03-01 NOTE — Plan of Care (Signed)

## 2024-03-02 ENCOUNTER — Ambulatory Visit (HOSPITAL_COMMUNITY)

## 2024-03-03 ENCOUNTER — Telehealth (HOSPITAL_COMMUNITY): Payer: Self-pay

## 2024-03-03 NOTE — Progress Notes (Signed)
 [facility] CONSULT NOTE  Referring Physician: Teresa Aldona CROME, NP Primary Care: Teresa Aldona CROME, NP Primary Cardiologist: [name], MD   HPI: Madison West is a 68 y.o. female with history of HFrEF, HLD, carotid stenosis s/p R CEA, h/o breast cancer (s/p chemo/radiation in 2009) and h/o DVT.  Prolonged hospital stay in 8/24 with VF arrest and cardiogenic shock requiring IABP/milrinone  support due to acute stent thrombosis and severe AS s/p TAVR. Course complicated by PNA, provoked DVT to LIJ.   Recently underwent bronchoscopy and lung biopsy by Dr. Theophilus for hemoptysis, without malignancy.   Admitted last week to [facility] for palpitations and anxiety. ECG showed sinus tachycardia with 1AVB. Felt to be fairly euvolemic. NYHA II. Discharge on losartan , spiro, farxiga , and bisoprolol  with referral to EP for ICD.   Today she presents for post hospital follow up. Overall feeling well, however chronically anxious. Reports that she was at work last week when she started having palpitations and her heart rate went up to 130s. Reports that this did not improve with deep breathing or her PRN Xanax . She then went to ED. Has not had palpitations since. NYHA I.  Denies chest pain, near-syncope, palpitations, and dizziness. Able to perform ADLs. Appetite okay. Weight at home has been stable. BP at home reported ~120/80s. Compliant with all medications. Still drinking some. Works nights as a Leisure centre manager.   Past Medical History:  Diagnosis Date   Anxiety    Aortic stenosis    s/p TAVR 02/04/23   Arthritis    low back and hip pain intermittent   Breast cancer (HCC) 06/02/2007   r breast -surgery ,radiaology. chemotherapy   Carotid artery disease (HCC)    Carpal tunnel syndrome    right hand   CHF (congestive heart failure) (HCC)    Colon polyps    Complication of anesthesia    Fentanyl , Versed -makes extra hyper, bradycardia x 1 in PACU, [facility] (08/15/11 cardiology felt neostigmine  may have resulted in AV nodal block)    Coronary artery disease    Depression    denies   DVT (deep venous thrombosis) (HCC)    provoked left IJ 02/10/23   Dysplasia of vulva    Hypertension    Palpitations    PSVT, s/p adenosine  08/04/16   S/P breast lumpectomy 07/04/2007   R breast   S/P radiation therapy 2009   Vulva cancer [facility])     Current Outpatient Medications  Medication Sig Dispense Refill   acetaminophen  (TYLENOL ) 325 MG tablet Take 2 tablets (650 mg total) by mouth every 6 (six) hours.     alprazolam  (XANAX ) 2 MG tablet Take 0.25-2 mg by mouth See admin instructions. Take 1 mg (may take up to 2 mg) twice daily. May also take 0.25 mg during the day if needed for anxiety.     amoxicillin  (AMOXIL ) 500 MG tablet Take 4 tablets (2,000 mg total) by mouth as directed. 1 HOUR PRIOR TO DENTAL PROCEDURES, INCLUDING CLEANINGS 12 tablet 12   Bisoprolol  Fumarate 2.5 MG TABS Take 2.5 mg by mouth daily. 30 tablet 3   busPIRone  (BUSPAR ) 10 MG tablet TAKE 1 TABLET BY MOUTH TWICE A DAY 180 tablet 1   clopidogrel  (PLAVIX ) 75 MG tablet Take 1 tablet (75 mg total) by mouth daily. 90 tablet 3   dapagliflozin  propanediol (FARXIGA ) 10 MG TABS tablet Take 1 tablet (10 mg total) by mouth daily before breakfast. 90 tablet 3   furosemide  (LASIX ) 20 MG tablet  Take 0.5 tablets (10 mg total) by mouth daily as needed (for lower extremity swelling and weight gain). 30 tablet 3   losartan  (COZAAR ) 25 MG tablet Take 1 tablet (25 mg total) by mouth daily. 90 tablet 3   methocarbamol  (ROBAXIN ) 500 MG tablet Take 1 tablet (500 mg total) by mouth 2 (two) times daily. 60 tablet 0   nitroGLYCERIN  (NITROSTAT ) 0.4 MG SL tablet Place 1 tablet (0.4 mg total) under the tongue every 5 (five) minutes as needed for chest pain. 25 tablet 2   oxyCODONE  (ROXICODONE ) 5 MG immediate release tablet Take 1 tablet (5 mg total) by mouth daily as needed for severe pain (pain score 7-10). 30 tablet 0   rosuvastatin  (CRESTOR ) 10 MG  tablet Take 1 tablet (10 mg total) by mouth daily. 90 tablet 3   spironolactone  (ALDACTONE ) 25 MG tablet Take 1 tablet (25 mg total) by mouth daily. 90 tablet 3   No current facility-administered medications for this encounter.    Allergies  Allergen Reactions   Prednisone Palpitations   Fentanyl  Swelling and Other (See Comments)    Makes pt hyper feels she is climbing the walls.  Last time she had a colonoscopy with Fentanyl  and Versed  she was awake the whole time.   Versed  [Midazolam ] Swelling and Anxiety    Makes pt hyper feels she is climbing the walls.  Last time she had a colonoscopy with Fentanyl  and Versed  she was awake the whole time.   Vicodin [Hydrocodone -Acetaminophen ] Anxiety    Extreme anxiety.  OK to take oxycodone .      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  Tobacco Use   Smoking status: Former    Current packs/day: 0.00    Average packs/day: 0.8 packs/day for 20.0 years (15.0 ttl pk-yrs)    Types: Cigarettes    Start date: 05/06/1977    Quit date: 05/06/1997    Years since quitting: 26.8   Smokeless tobacco: Never  Vaping Use   Vaping status: Never Used  Substance and Sexual Activity   Alcohol use: Yes    Alcohol/week: 2.0 standard drinks of alcohol    Types: 2 Standard drinks or equivalent per week    Comment: 2-3 drinks per week usually   Drug use: No   Sexual activity: Not Currently  Other Topics Concern   Not on file  Social History Narrative   6 years of college   Social Drivers of Health   Financial Resource Strain: Low Risk  (03/01/2024)   Overall Financial Resource Strain (CARDIA)    Difficulty of Paying Living Expenses: Not hard at all  Food Insecurity:  No Food Insecurity (03/10/2023)   Received from [facility]   Hunger Vital Sign    Within the past 12 months, you worried that your food would run out before you got the money to buy more.: Never true    Within the past 12 months, the food you bought just didn't last and you didn't have money to get more.: Never true  Transportation Needs: No Transportation Needs (03/01/2024)   PRAPARE - Administrator, Civil Service (Medical): No    Lack of Transportation (Non-Medical): No  Physical Activity: Not  on file  Stress: Not on file  Social Connections: Unknown (03/05/2023)   [facility] from [facility]   Social Network    Social Network: Not on file  Intimate Partner Violence: Unknown (03/05/2023)   Received from [facility]   HITS    Physically Hurt: Not on file    Insult or Talk Down To: Not on file    Threaten Physical Harm: Not on file    Scream or Curse: Not on file      Family History  Problem Relation Age of Onset   Colon cancer Mother 39   Heart disease Father        heart attack   Colon polyps Brother        one brother had large colon polyp that needed surgery to be removed   Heart disease Paternal Grandfather    Diabetes type II Brother     Vitals:   03/06/24 1018  BP: 110/72  Pulse: 70  SpO2: 97%  Weight: 87.4 kg (192 lb 9.6 oz)  Height: 5' 8 (1.727 m)    Filed Weights   03/06/24 1018  Weight: 87.4 kg (192 lb 9.6 oz)    PHYSICAL EXAM: General: Well appearing. No distress on RA Cardiac: JVP flat. S1 and S2 present. No murmurs or rub. Resp: Lung sounds clear and equal B/L Abdomen: Soft, non-tender, non-distended.  Extremities: Warm and dry.  No edema.  Neuro: Alert and oriented x3. Affect pleasant. Moves all extremities without difficulty.  ECG: NSR w 1AVB 64 bpm  ASSESSMENT & PLAN:  Chronic Systolic Heart Failure - Echo 1/75 EF 20-25%, RV okay, severe low flow low gradient severe AS - Etiology is felt to be ICM. ETOH could also be  playing a role (10 drinks/week) - EF has remains low despite TAVR and GDMT. Most recent EF 8/25 was 35% with nl RV function - NYHA Class: I. Euvolemic.  - GDMT  ARB/ARNi: losartan  25 mg daily  MRA: spirolactone 25 mg daily  SGLT2i: farxiga  10 mg daily  ? blocker: bisoprolol  2.5 mg daily - referred to EP for ICD evaluation; has appt 9/26  CAD - h/o multiple PCI. DES to LAD in 07, thrombosed in 09 treated with thrombectomy. PCI To RCA in 2021.  - LHC 8/24 with patent LAD stent - no chest pain - continue plavix    H/o VT arrest - referred to EP for ICD placement  Aortic stenosis s/p TAVR 8/24 - valve stable: mean gradient 6, peak 103, ava 2.2  Follow up with EP and Dr. Elmira.  [place] Marquisha Nikolov, NP 03/06/24

## 2024-03-03 NOTE — Progress Notes (Signed)
 Discharge Progress Report  Patient Details  Name: Madison West MRN: [id number] Date of Birth: September 18, 1955 Referring Provider:   [facility] Walk Test from 12/01/2023 in [facility]  Referring Provider Mannam     Number of Visits: 20  Reason for Discharge:  Patient has met program and personal goals.  Smoking History:  Social History   Tobacco Use  Smoking Status Former   Current packs/day: 0.00   Average packs/day: 0.8 packs/day for 20.0 years (15.0 ttl pk-yrs)   Types: Cigarettes   Start date: 05/06/1977   Quit date: 05/06/1997   Years since quitting: 26.8  Smokeless Tobacco Never    Diagnosis:  DOE (dyspnea on exertion)  ADL [facility]SD:  Pulmonary Assessment Scores     Row Name 12/01/23 1314 02/24/24 1555       ADL UCSD   ADL Phase Entry Exit    SOB Score total 28 --      CAT Score   CAT Score 10 --      mMRC Score   mMRC Score 2 2       Initial Exercise Prescription:  Initial Exercise Prescription - 12/01/23 1400       Date of Initial Exercise RX and Referring Provider   Date 12/01/23    Referring Provider Mannam    Expected Discharge Date 02/29/24      Oxygen   Oxygen Continuous    Liters 1    Maintain Oxygen Saturation 88% or higher      Bike   Level 2    Watts 70    Minutes 15    METs 2.2      Track   Laps 8    Minutes 15    METs 2.2      Prescription Details   Frequency (times per week) 2    Duration Progress to 30 minutes of continuous aerobic without signs/symptoms of physical distress      Intensity   THRR 40-80% of Max Heartrate 60-120    Ratings of Perceived Exertion 11-13    Perceived Dyspnea 0-4      Progression   Progression Continue to progress workloads to maintain intensity without signs/symptoms of physical distress.      Resistance Training   Training Prescription Yes    Weight blue bands    Reps 10-15          Discharge  Exercise Prescription (Final Exercise Prescription Changes):  Exercise Prescription Changes - 02/22/24 1400       Response to Exercise   Blood Pressure (Admit) 144/80    Blood Pressure (Exercise) 152/72    Blood Pressure (Exit) 122/70    Heart Rate (Admit) 97 bpm    Heart Rate (Exercise) 106 bpm    Heart Rate (Exit) 101 bpm    Oxygen Saturation (Admit) 93 %    Oxygen Saturation (Exercise) 97 %    Oxygen Saturation (Exit) 94 %    Rating of Perceived Exertion (Exercise) 11    Perceived Dyspnea (Exercise) 1    Duration Continue with 30 min of aerobic exercise without signs/symptoms of physical distress.    Intensity THRR unchanged      Progression   Progression Continue to progress workloads to maintain intensity without signs/symptoms of physical distress.      Resistance Training   Training Prescription Yes    Weight blue bands    Reps 10-15    Time 10  Minutes      Oxygen   Oxygen Continuous    Liters 2      Bike   Level 3.5    Watts 36    Minutes 15    METs 4.1      Track   Laps 8    METs 2.2      Home Exercise Plan   Plans to continue exercise at [employer] (comment)    Frequency Add 1 additional day to program exercise sessions.    Initial Home Exercises Provided 02/22/24      Oxygen   Maintain Oxygen Saturation 88% or higher          Functional Capacity:  6 Minute Walk     Row Name 11/01/23 1300 12/01/23 1439 02/24/24 1549     6 Minute Walk   Phase Discharge Initial Discharge   Distance 1298 feet 1170 feet 1530 feet   Distance % Change 10 % -- 30.7 %   Distance Feet Change 118 ft -- 360 ft   Walk Time 6 minutes 6 minutes 6 minutes   # of Rest Breaks 0 0 0   MPH 2.5 2.22 2.9   METS 2.91 2.81 3.74   RPE 13 12 13    Perceived Dyspnea  1 1 1    VO2 Peak 10.2 9.83 13.08   Symptoms Yes (comment) Yes (comment)  c/o 3/10 hip pain with distance Yes (comment)   Comments SaO2 dropped to 88% on 2L @ 4 minutes-, increased to 3L. Pt had mils SOB, RPD  = 1.  2/10 bilateral leg pain -- DOE and c/o bilateral hip pain   Resting HR 81 bpm 85 bpm 80 bpm   Resting BP 120/60 100/58 116/74   Resting Oxygen Saturation  90 % 97 % 94 %   Exercise Oxygen Saturation  during 6 min walk 88 % 87 % 90 %   Max Ex. HR 108 bpm 112 bpm 127 bpm   Max Ex. BP 140/60 146/70 160/64   2 Minute Post BP -- 130/70 144/70     Interval HR   1 Minute HR -- 99 101   2 Minute HR -- 104 104   3 Minute HR -- 109 120   4 Minute HR -- 112 124   5 Minute HR -- 112 125   6 Minute HR -- 112 127   2 Minute Post HR -- 93 93   Interval Heart Rate? -- Yes --     Interval Oxygen   Interval Oxygen? Yes Yes --   Baseline Oxygen Saturation % -- 97 % 94 %   1 Minute Oxygen Saturation % 93 % 89 % 98 %   1 Minute Liters of Oxygen 2 L 0 L 1 L   2 Minute Oxygen Saturation % 89 % 91 %  87 RA 1:40, up to 1L 94 %   2 Minute Liters of Oxygen 2 L 1 L 1 L   3 Minute Oxygen Saturation % 89 % 93 % 93 %   3 Minute Liters of Oxygen 2 L 1 L 1 L   4 Minute Oxygen Saturation % 88 % 92 % 95 %   4 Minute Liters of Oxygen 2 L  increased to 3L 1 L 1 L   5 Minute Oxygen Saturation % 91 % 93 % 92 %   5 Minute Liters of Oxygen 3 L 1 L 1 L   6 Minute Oxygen Saturation % 91 % 92 %  90 %   6 Minute Liters of Oxygen 3 L 1 L 1 L   2 Minute Post Oxygen Saturation % 93 % 97 % 99 %   2 Minute Post Liters of Oxygen 2 L 1 L 1 L      Psychological, QOL, Others - Outcomes: PHQ 2/9:    12/01/2023    1:00 PM 11/17/2023    2:03 PM 08/02/2023    1:54 PM 07/26/2023    2:19 PM 07/05/2023    2:00 PM  Depression screen PHQ 2/9  Decreased Interest 0 0 0 1 0  Down, Depressed, Hopeless 0 0 0 0 0  PHQ - 2 Score 0 0 0 1 0  Altered sleeping 1 0  2   Tired, decreased energy 1 1  3    Change in appetite 1 0  1   Feeling bad or failure about yourself  0 0  0   Trouble concentrating 0 0  0   Moving slowly or fidgety/restless 0 0  0   Suicidal thoughts 0 0  0   PHQ-9 Score 3 1  7    Difficult doing work/chores Not  difficult at all Not difficult at all  Not difficult at all     Quality of Life:  Quality of Life - 12/23/23 1541       Quality of Life   Select --   Pt returned paperwork 12/21/23     Quality of Life Scores   Health/Function Pre 20 %    Health/Function Post 24.86 %    Health/Function % Change 24.3 %    Socioeconomic Pre 26.57 %    Socioeconomic Post 29 %    Socioeconomic % Change  9.15 %    Psych/Spiritual Pre 18.66 %    Psych/Spiritual Post 25.71 %    Psych/Spiritual % Change 37.78 %    Family Pre 18 %    Family Post 25.5 %    Family % Change 41.67 %    GLOBAL Pre 21.1 %    GLOBAL Post 25.94 %    GLOBAL % Change 22.94 %          Personal Goals: Goals established at orientation with interventions provided to work toward goal.  Personal Goals and Risk Factors at Admission - 12/01/23 1344       Core Components/Risk Factors/Patient Goals on Admission    Weight Management Yes;Obesity;Weight Loss    Intervention Weight Management: Develop a combined nutrition and exercise program designed to reach desired caloric intake, while maintaining appropriate intake of nutrient and fiber, sodium and fats, and appropriate energy expenditure required for the weight goal.;Weight Management: Provide education and appropriate resources to help participant work on and attain dietary goals.;Weight Management/Obesity: Establish reasonable short term and long term weight goals.;Obesity: Provide education and appropriate resources to help participant work on and attain dietary goals.    Admit Weight 194 lb 9.6 oz (88.3 kg)    Goal Weight: Short Term 184 lb (83.5 kg)    Goal Weight: Long Term 170 lb (77.1 kg)   pt goal   Expected Outcomes Short Term: Continue to assess and modify interventions until short term weight is achieved;Long Term: Adherence to nutrition and physical activity/exercise program aimed toward attainment of established weight goal;Weight Loss: Understanding of general  recommendations for a balanced deficit meal plan, which promotes 1-2 lb weight loss per week and includes a negative energy balance of [phone] kcal/d;Understanding recommendations for meals to include 15-35% energy as protein,  25-35% energy from fat, 35-60% energy from carbohydrates, less than 200mg  of dietary cholesterol, 20-35 gm of total fiber daily;Understanding of distribution of calorie intake throughout the day with the consumption of 4-5 meals/snacks    Tobacco Cessation Yes    Number of packs per day pt states smokes occassionally with dgt    Intervention Assist the participant in steps to quit. Provide individualized education and counseling about committing to Tobacco Cessation, relapse prevention, and pharmacological support that can be provided by physician.;Education officer, environmental, assist with locating and accessing local/national Quit Smoking programs, and support quit date choice.    Expected Outcomes Short Term: Will demonstrate readiness to quit, by selecting a quit date.;Long Term: Complete abstinence from all tobacco products for at least 12 months from quit date.;Short Term: Will quit all tobacco product use, adhering to prevention of relapse plan.    Improve shortness of breath with ADL's Yes    Intervention Provide education, individualized exercise plan and daily activity instruction to help decrease symptoms of SOB with activities of daily living.    Expected Outcomes Short Term: Improve cardiorespiratory fitness to achieve a reduction of symptoms when performing ADLs;Long Term: Be able to perform more ADLs without symptoms or delay the onset of symptoms    Stress Yes    Intervention Offer individual and/or small group education and counseling on adjustment to heart disease, stress management and health-related lifestyle change. Teach and support self-help strategies.;Refer participants experiencing significant psychosocial distress to appropriate mental health specialists  for further evaluation and treatment. When possible, include family members and significant others in education/counseling sessions.    Expected Outcomes Short Term: Participant demonstrates changes in health-related behavior, relaxation and other stress management skills, ability to obtain effective social support, and compliance with psychotropic medications if prescribed.;Long Term: Emotional wellbeing is indicated by absence of clinically significant psychosocial distress or social isolation.           Personal Goals Discharge:  Goals and Risk Factor Review     Row Name 09/21/23 9041 10/12/23 0908 11/09/23 1605 12/28/23 1232 01/19/24 1518     Core Components/Risk Factors/Patient Goals Review   Personal Goals Review Weight Management/Obesity;Heart Failure;Lipids;Hypertension;Stress;Improve shortness of breath with ADL's Weight Management/Obesity;Heart Failure;Lipids;Hypertension;Stress;Improve shortness of breath with ADL's Weight Management/Obesity;Heart Failure;Lipids;Hypertension;Stress;Improve shortness of breath with ADL's Weight Management/Obesity;Stress;Improve shortness of breath with ADL's;Develop more efficient breathing techniques such as purse lipped breathing and diaphragmatic breathing and practicing self-pacing with activity. Weight Management/Obesity;Stress;Improve shortness of breath with ADL's;Develop more efficient breathing techniques such as purse lipped breathing and diaphragmatic breathing and practicing self-pacing with activity.   Review Madison West is doing well with exercise at  cardiac rehab. Vital signs and oxygen saturations have been stable now that Madison West is wearing oxygen with exercise. Oxygen saturations have remained in the low to mid 90's on 2l/min. Madison West would benefit from participating in pulmonary rehab when she completes cardiac rehab. Madison West says she is more interested in water aerobics. Madison West has lost 1.7 kg since starting cardiac rehab. Madison West is doing well with  exercise at  cardiac rehab. Vital signs have been stable.  Oxygen saturations have remained in the upper 80's to  mid 90's on 2l/min. Madison West would benefit from participating in pulmonary rehab when she completes cardiac rehab. Madison West says she is more interested in water aerobics. Madison West has lost 2.0 kg since starting cardiac rehab. Will notify Dr [place] about oxygen saturations. Madison West is doing well with exercise at  cardiac rehab. Vital signs have been stable.  Oxygen saturations have remained in the upper 80's to  mid 90's on 2l/min. Madison West would benefit from participating in pulmonary rehab when she completes cardiac rehab. Madison West has lost 4 kg since starting cardiac rehab. Madison West will complete cardiac rehab on 11/17/23 Monthly review of patient's Core Components/Risk Factors/Patient Goals are as follows: Goal in progress for improving her shortness of breath with ADLs. Madison West has been stable on 2L [place]. She has completed 3 sessions so far. Goal progressing on developing more efficient breathing techniques such as purse lipped breathing and diaphragmatic breathing; and practicing self-pacing with activity. She needs to be prompted to initiate pursed lip breathing. She has begun practicing diaphragmatic breathing before bed. She can self-pace herself when needed and allows herself to take breaks while walking or exercising. Goal not met for smoking cessation. Madison West has not set a plan. She doesn't have a desire to quit smoking yet. She has been provided resources. Goal progressing for weight loss. Madison West is currently down #1.3# since starting the program. She is working with our dietitian to decrease calories. Madison West will continue to benefit from PR for nutrition, education, exercise, and lifestyle modification. Monthly review of patient's Core Components/Risk Factors/Patient Goals are as follows: Goal in progress for improving her shortness of breath with ADLs. Madison West has been stable on 2L [place] while walking the track. Her oxygen needs have  decreased on the bike, and she has been on room air. Goal met on developing more efficient breathing techniques such as purse lipped breathing and diaphragmatic breathing; and practicing self-pacing with activity. Madison West can initiate pursed lip breathing on her own. She practices diaphragmatic breathing before bed. She can self-pace herself when needed and allows herself to take breaks while walking or exercising. Goal not met for smoking cessation. Madison West has not set a plan. She doesn't have a desire to quit smoking yet. She states she has cut back. She has been provided resources. Goal progressing for weight loss. Madison West is currently down ~1# since starting the program. She is working with our dietitian to decrease calories and make healthy choices. Goal met for decreased stress. Madison West's health is better than it was when she started the program and her tests and lab results have come back negative. Madison West will continue to benefit from PR for nutrition, education, exercise, and lifestyle modification.   Expected Outcomes Madison West will continue to participate in cardiac rehab for exercise, nutrition and lifestyle modifications Madison West will continue to participate in cardiac rehab for exercise, nutrition and lifestyle modifications Madison West will continue to participate in cardiac rehab for exercise, nutrition and lifestyle modifications Pt will show progress toward meeting expected goals and outcomes. Pt will show progress toward meeting expected goals and outcomes.    Row Name 02/18/24 1054 03/03/24 1010           Core Components/Risk Factors/Patient Goals Review   Personal Goals Review Weight Management/Obesity;Stress;Improve shortness of breath with ADL's Weight Management/Obesity;Stress;Improve shortness of breath with ADL's      Review Monthly review of patient's Core Components/Risk Factors/Patient Goals are as follows: Goal in progress for improving her shortness of breath with ADLs. Madison West has been stable on 2L [place]  while walking the track. Goal not met for smoking cessation. Madison West has not set a plan. She doesn't have a desire to quit smoking yet. She states she has cut back. She has been provided resources. Goal not met for weight loss. Madison West has maintained her weight since last month. She is working with our dietitian to  decrease calories and make healthy choices. Goal not met for decreased stress. New stressors have evolved in Madison West's life, and she continues to work through with help from her daughter and therapist. Madison West will continue to benefit from PR for nutrition, education, exercise, and lifestyle modification. Madison West graduated from the [employer]. Goal not met for weight loss. Her initial weight was 88.3kg/post 87.9kg. Goal not met for stress. Unfortunately, Madison West continues to be very stressed by her health and family life. Goal met for improving SOB with ADL's.      Expected Outcomes Pt will show progress toward meeting expected goals and outcomes. To continue to exercise and modify her nutrition and lifestyle post graduation         Exercise Goals and Review:  Exercise Goals     Row Name 12/01/23 1313             Exercise Goals   Increase Physical Activity Yes       Intervention Provide advice, education, support and counseling about physical activity/exercise needs.;Develop an individualized exercise prescription for aerobic and resistive training based on initial evaluation findings, risk stratification, comorbidities and participant's personal goals.       Expected Outcomes Short Term: Attend rehab on a regular basis to increase amount of physical activity.;Long Term: Exercising regularly at least 3-5 days a week.;Long Term: Add in home exercise to make exercise part of routine and to increase amount of physical activity.       Increase Strength and Stamina Yes       Intervention Develop an individualized exercise prescription for aerobic and resistive training based on initial evaluation findings, risk  stratification, comorbidities and participant's personal goals.;Provide advice, education, support and counseling about physical activity/exercise needs.       Expected Outcomes Short Term: Increase workloads from initial exercise prescription for resistance, speed, and METs.;Short Term: Perform resistance training exercises routinely during rehab and add in resistance training at home;Long Term: Improve cardiorespiratory fitness, muscular endurance and strength as measured by increased METs and functional capacity ( )       Able to understand and use rate of perceived exertion (RPE) scale Yes       Intervention Provide education and explanation on how to use RPE scale       Expected Outcomes Short Term: Able to use RPE daily in rehab to express subjective intensity level;Long Term:  Able to use RPE to guide intensity level when exercising independently       Able to understand and use Dyspnea scale Yes       Intervention Provide education and explanation on how to use Dyspnea scale       Expected Outcomes Short Term: Able to use Dyspnea scale daily in rehab to express subjective sense of shortness of breath during exertion;Long Term: Able to use Dyspnea scale to guide intensity level when exercising independently       Knowledge and understanding of Target Heart Rate Range (THRR) Yes       Intervention Provide education and explanation of THRR including how the numbers were predicted and where they are located for reference       Expected Outcomes Short Term: Able to state/look up THRR;Short Term: Able to use daily as guideline for intensity in rehab;Long Term: Able to use THRR to govern intensity when exercising independently       Understanding of Exercise Prescription Yes       Intervention Provide education, explanation, and written materials on patient's individual exercise  prescription       Expected Outcomes Short Term: Able to explain program exercise prescription;Long Term: Able to explain  home exercise prescription to exercise independently          Exercise Goals Re-Evaluation:  Exercise Goals Re-Evaluation     Row Name 09/13/23 1500 12/21/23 0919 01/20/24 0736 02/15/24 0912 03/01/24 1408     Exercise Goal Re-Evaluation   Exercise Goals Review Increase Physical Activity;Increase Strength and Stamina;Able to understand and use rate of perceived exertion (RPE) scale;Knowledge and understanding of Target Heart Rate Range (THRR);Understanding of Exercise Prescription Increase Physical Activity;Increase Strength and Stamina;Able to understand and use rate of perceived exertion (RPE) scale;Knowledge and understanding of Target Heart Rate Range (THRR);Understanding of Exercise Prescription;Able to understand and use Dyspnea scale Increase Physical Activity;Increase Strength and Stamina;Able to understand and use rate of perceived exertion (RPE) scale;Knowledge and understanding of Target Heart Rate Range (THRR);Understanding of Exercise Prescription;Able to understand and use Dyspnea scale Increase Physical Activity;Increase Strength and Stamina;Able to understand and use rate of perceived exertion (RPE) scale;Knowledge and understanding of Target Heart Rate Range (THRR);Understanding of Exercise Prescription;Able to understand and use Dyspnea scale Increase Physical Activity;Increase Strength and Stamina;Able to understand and use rate of perceived exertion (RPE) scale;Knowledge and understanding of Target Heart Rate Range (THRR);Understanding of Exercise Prescription;Able to understand and use Dyspnea scale   Comments Reviewed METs and goals. Pt voices progress on her goals of increased strength, stamina and energy. Pt has goal of weight loss and has lost 5.6 kg to date. Pt has completed 2 exercise sessions in PR, missing 2 sessions due to fatigue. She is exercising for 15 min on the track and bike. Madison West averages 2.37 METs on the track and 3.8 METs at level 3 on the bike. Madison West performs the  warmup and cooldown standing without limitations, using blue bands, 5.8 lbs. Will progress as tolerated. Pt has completed 10 exercise sessions in PR. She is exercising for 15 min on the track and bike. Madison West averages 2.5 METs on the track and 4.5 METs at level 3.5 on the bike. Madison West performs the warmup and cooldown standing without limitations, using blue bands, 5.8 lbs, but moving to black next session. She is progressing well.  Will progress as tolerated. Pt has completed 16 exercise sessions in PR. She is exercising for 15 min on the track and bike. Madison West averages 2.85 METs on the track and 4.5 METs at level 3.5 on the bike. Madison West performs the warmup and cooldown standing without limitations, using black bands, 7.3 lbs. She is progressing well.  Will progress as tolerated., she is scheduled to graduate 9/9 Pt completed 20 exercise sessions in PR. She increased her METs to max 5.6. She increased her 6 min walk test by 360 ft, 30% increase. She was successful in the program. She did not return her questionnaires to evaluate her SOB or CAT scores. She plans to exercise at the [facility] or [facility].   Expected Outcomes Will continue to monitor patient and progress exercise workloads as tolerated. Will continue to monitor patient and progress exercise workloads as tolerated. Will continue to monitor patient and progress exercise workloads as tolerated. Through exercise at rehab and home, the patient will decrease shortness of breath with daily activities and feel confident in carrying out an exercise regimen at home. Through exercise at rehab and home, the patient will decrease shortness of breath with daily activities and feel confident in carrying out an exercise regimen at home.  Nutrition & Weight - Outcomes:  Pre Biometrics - 12/01/23 1442       Pre Biometrics   Grip Strength 21 kg          Post Biometrics - 11/01/23 1704        Post  Biometrics   Height 5' 8 (1.727 m)    Weight 89.7 kg    Waist  Circumference 47 inches    Hip Circumference 51 inches    Waist to Hip Ratio 0.92 %    BMI (Calculated) 30.08    Triceps Skinfold 14 mm    % Body Fat 40.4 %    Grip Strength 20 kg    Flexibility 22 in    Single Leg Stand 18.57 seconds          Nutrition:  Nutrition Therapy & Goals - 02/10/24 1408       Nutrition Therapy   Diet Heart Healthy Diet    Drug/Food Interactions Statins/Certain Fruits      Personal Nutrition Goals   Nutrition Goal Patient to improve diet quality by using the plate method as a guide for meal planning to include lean protein/plant protein, fruits, vegetables, whole grains, nonfat dairy as part of a well-balanced diet.   goal in progress.   Personal Goal #2 Patient to identify strategies for weight loss of 0.5-2.0# per week.   goal not met.   Comments Goals in progress. Madison West has medical history of hyperlipidemia, s/p TAVR, STEMI, CHF, HTN, CAD, carotid artery stenosis. She recently completed intensive cardiac rehab. She is now starting pulmonary rehab for DOE, hemoptysis. She is motivated to lose weight to 170#; she lost about 6.6# throughout cardiac rehab but is up 1.5# since starting pulmonary rehab. LDL has improved but not at goal; multiple documentation of refusing statin but patient is currently prescribed 20mg  crestor . Patient will benefit from attendance to pulmonary rehab and  adherence to nutrition, exercise, and lifestyle modification.      Intervention Plan   Intervention Prescribe, educate and counsel regarding individualized specific dietary modifications aiming towards targeted core components such as weight, hypertension, lipid management, diabetes, heart failure and other comorbidities.;Nutrition handout(s) given to patient.    Expected Outcomes Short Term Goal: Understand basic principles of dietary content, such as calories, fat, sodium, cholesterol and nutrients.;Long Term Goal: Adherence to prescribed nutrition plan.          Nutrition  Discharge:  Nutrition Assessments - 12/21/23 1515       Rate Your Plate Scores   Pre Score 67          Education Questionnaire Score:  Knowledge Questionnaire Score - 12/23/23 1537       Knowledge Questionnaire Score   Post Score 21/24   Pt returned paperwork 12/21/23         Goals reviewed with patient; copy given to patient.

## 2024-03-03 NOTE — Telephone Encounter (Signed)
 Called to confirm/remind patient of their appointment at the [facility] on 03/06/24 9:45.   Appointment:   [x] Confirmed  [] Left mess   [] No answer/No voice mail  [] VM Full/unable to leave message  [] Phone not in service  Patient reminded to bring all medications and/or complete list.  Confirmed patient has transportation. Gave directions, instructed to utilize valet parking.

## 2024-03-06 ENCOUNTER — Ambulatory Visit (HOSPITAL_COMMUNITY): Payer: Self-pay | Admitting: Cardiology

## 2024-03-06 ENCOUNTER — Encounter (HOSPITAL_COMMUNITY): Payer: Self-pay

## 2024-03-06 ENCOUNTER — Ambulatory Visit (HOSPITAL_COMMUNITY)
Admission: RE | Admit: 2024-03-06 | Discharge: 2024-03-06 | Disposition: A | Discharge status: Home / Self Care | Source: Ambulatory Visit | Attending: Cardiology | Admitting: Cardiology

## 2024-03-06 VITALS — BP 110/72 | HR 70 | Ht 68.0 in | Wt 192.6 lb

## 2024-03-06 DIAGNOSIS — E785 Hyperlipidemia, unspecified: Secondary | ICD-10-CM | POA: Diagnosis not present

## 2024-03-06 DIAGNOSIS — Z87891 Personal history of nicotine dependence: Secondary | ICD-10-CM | POA: Diagnosis not present

## 2024-03-06 DIAGNOSIS — I444 Left anterior fascicular block: Secondary | ICD-10-CM | POA: Insufficient documentation

## 2024-03-06 DIAGNOSIS — Z9221 Personal history of antineoplastic chemotherapy: Secondary | ICD-10-CM | POA: Insufficient documentation

## 2024-03-06 DIAGNOSIS — Z955 Presence of coronary angioplasty implant and graft: Secondary | ICD-10-CM | POA: Diagnosis not present

## 2024-03-06 DIAGNOSIS — Z9889 Other specified postprocedural states: Secondary | ICD-10-CM | POA: Diagnosis not present

## 2024-03-06 DIAGNOSIS — R002 Palpitations: Secondary | ICD-10-CM

## 2024-03-06 DIAGNOSIS — Z79899 Other long term (current) drug therapy: Secondary | ICD-10-CM | POA: Diagnosis not present

## 2024-03-06 DIAGNOSIS — Z923 Personal history of irradiation: Secondary | ICD-10-CM | POA: Insufficient documentation

## 2024-03-06 DIAGNOSIS — Z952 Presence of prosthetic heart valve: Secondary | ICD-10-CM | POA: Insufficient documentation

## 2024-03-06 DIAGNOSIS — Z86718 Personal history of other venous thrombosis and embolism: Secondary | ICD-10-CM | POA: Diagnosis not present

## 2024-03-06 DIAGNOSIS — Z8674 Personal history of sudden cardiac arrest: Secondary | ICD-10-CM | POA: Insufficient documentation

## 2024-03-06 DIAGNOSIS — I2510 Atherosclerotic heart disease of native coronary artery without angina pectoris: Secondary | ICD-10-CM | POA: Insufficient documentation

## 2024-03-06 DIAGNOSIS — I5020 Unspecified systolic (congestive) heart failure: Secondary | ICD-10-CM

## 2024-03-06 DIAGNOSIS — I5022 Chronic systolic (congestive) heart failure: Secondary | ICD-10-CM | POA: Insufficient documentation

## 2024-03-06 DIAGNOSIS — Z7902 Long term (current) use of antithrombotics/antiplatelets: Secondary | ICD-10-CM | POA: Insufficient documentation

## 2024-03-06 DIAGNOSIS — Z853 Personal history of malignant neoplasm of breast: Secondary | ICD-10-CM | POA: Diagnosis not present

## 2024-03-06 LAB — BASIC METABOLIC PANEL WITH GFR
Anion gap: 10 (ref 5–15)
BUN: 11 mg/dL (ref 8–23)
CO2: 23 mmol/L (ref 22–32)
Calcium: 9.8 mg/dL (ref 8.9–10.3)
Chloride: 102 mmol/L (ref 98–111)
Creatinine, Ser: 0.79 mg/dL (ref 0.44–1.00)
GFR, Estimated: >60 mL/min (ref >60)
Glucose, Bld: 89 mg/dL (ref 70–99)
Potassium: 4.1 mmol/L (ref 3.5–5.1)
Sodium: 135 mmol/L (ref 135–145)

## 2024-03-06 NOTE — Patient Instructions (Signed)
 Medication Changes:  No Changes In Medications at this time.   Lab Work:  Labs done today, your results will be available in MyChart, we will contact you for abnormal readings.  Follow-Up in: with Dr. Elmira as scheduled.  At the [facility], you and your health needs are our priority. We have a designated team specialized in the treatment of Heart Failure. This Care Team includes your primary Heart Failure Specialized Cardiologist (physician), Advanced Practice Providers (APPs- Physician Assistants and Nurse Practitioners), and Pharmacist who all work together to provide you with the care you need, when you need it.   You may see any of the following providers on your designated Care Team at your next follow up:  Dr. Toribio Fuel Dr. Ezra Shuck Dr. Ria Commander Dr. Odis Brownie Greig Mosses, NP Caffie Shed, [place] [facility] Guy, [place] Beckey Coe, NP [place] Lee, NP Tinnie Redman, PharmD   Please be sure to bring in all your medications bottles to every appointment.   Need to Contact Us :  If you have any questions or concerns before your next appointment please send us  a message through [facility] or call our office at [phone].    TO LEAVE A MESSAGE FOR THE NURSE SELECT OPTION 2, PLEASE LEAVE A MESSAGE INCLUDING: YOUR NAME DATE OF BIRTH CALL BACK NUMBER REASON FOR CALL**this is important as we prioritize the call backs  YOU WILL RECEIVE A CALL BACK THE SAME DAY AS LONG AS YOU CALL BEFORE 4:00 PM

## 2024-03-08 ENCOUNTER — Ambulatory Visit: Admitting: Pulmonary Disease

## 2024-03-08 DIAGNOSIS — R0609 Other forms of dyspnea: Secondary | ICD-10-CM

## 2024-03-08 LAB — PULMONARY FUNCTION TEST
DL/VA % pred: 74 %
DL/VA: 2.98 ml/min/mmHg/L
DLCO cor % pred: 66 %
DLCO cor: 14.82 ml/min/mmHg
DLCO unc % pred: 66 %
DLCO unc: 14.72 ml/min/mmHg
FEF 25-75 Pre: 1.32 L/s
FEF2575-%Pred-Pre: 61 %
FEV1-%Pred-Pre: 75 %
FEV1-Pre: 2.01 L
FEV1FVC-%Pred-Pre: 92 %
FEV6-%Pred-Pre: 84 %
FEV6-Pre: 2.83 L
FEV6FVC-%Pred-Pre: 103 %
FVC-%Pred-Pre: 81 %
FVC-Pre: 2.86 L
Pre FEV1/FVC ratio: 70 %
Pre FEV6/FVC Ratio: 99 %
RV % pred: 87 %
RV: 2.09 L
TLC % pred: 91 %
TLC: 5.17 L

## 2024-03-08 NOTE — Progress Notes (Signed)
 Spirometry, DLCO and lung volumes performed today. Patient declined Albuterol  due to recent heart issues therefore, no post spirometry.

## 2024-03-08 NOTE — Patient Instructions (Signed)
 PFT performed today.

## 2024-03-10 ENCOUNTER — Ambulatory Visit: Payer: Self-pay | Admitting: Pulmonary Disease

## 2024-03-17 ENCOUNTER — Encounter: Payer: Self-pay | Admitting: Student in an Organized Health Care Education/Training Program

## 2024-03-17 ENCOUNTER — Ambulatory Visit
Attending: Student in an Organized Health Care Education/Training Program | Admitting: Student in an Organized Health Care Education/Training Program

## 2024-03-17 VITALS — BP 140/72 | HR 82 | Ht 68.0 in | Wt 192.0 lb

## 2024-03-17 DIAGNOSIS — Z01812 Encounter for preprocedural laboratory examination: Secondary | ICD-10-CM | POA: Diagnosis present

## 2024-03-17 DIAGNOSIS — I255 Ischemic cardiomyopathy: Secondary | ICD-10-CM | POA: Insufficient documentation

## 2024-03-17 DIAGNOSIS — I5020 Unspecified systolic (congestive) heart failure: Secondary | ICD-10-CM | POA: Diagnosis present

## 2024-03-17 NOTE — Progress Notes (Signed)
 Cardiology Office Note   Date:  03/18/2024  ID:  Madison West, DOB December 13, 1955, MRN [id number] PCP: Teresa Aldona CROME, NP  [facility] Providers Cardiologist:  [name], MD Electrophysiologist:  Donnice DELENA Primus, MD  Structural Heart:  Lurena MARLA Red, MD  History of Present Illness Madison West is a 68 y.o. female with CAD, ICM/HFrEF, VF arrest in the setting of cardiogenic shock 2/2 acute stent thrombosis, SVT (08/03/16, terminated with ADO), severe AS s/p TAVR, NYHA class II symptoms, HLD, prior provoked DVT, asx carotid artery disease s/p right CEA (06/09/19), and prior breast cancer s/p chemotx/radiation 2009 who presents for evaluation of 1' prevention ICD implant.  She was seen by Dr. Ladona in July 2021 when with symptoms suggestive of progressive angina.  She had an abnormal echocardiogram and nuclear stress test so was recommended to undergo coronary evaluation.  On 02/27/20 she had coronary angiography with multivessel disease of the RCA which was mild, diffusely diseased circumflex with small caliber vessels, significant disease in the ramus intermediate for which she underwent PCI x 2 with overlapping stents.  She had ongoing shortness of breath and August 2024 that was progressively worsening.  She was also having associated chest pain with exertional activities and had been under a fair amount of stress.  The chest discomfort was similar to what she had experienced prior to her PCI in 2021.  She was admitted on 01/24/2023.  She was not on statin therapy at that time with an LDL of 141 as she was trying to avoid medical therapy if possible.  She has a significant amount of anxiety related to procedures and medications.  In August 2024 her LVEF was 30-35% with an entirely hypokinetic septum and apex.  Her echo in 05/2022 had preserved LVEF.  Coronary evaluation with Dr. Lawrence with 95% mid LAD stenosis with a 40% downstream lesion that reportedly from  prior with 50% diffuse circumflex disease and 40% RCA disease that was eccentric.  Aortic valve area was 1.0 cm and an AVA index of 0.5 cm/m with a mean gradient of 29 mmHg consistent with low-flow low gradient severe AS.  Discussion with multidisciplinary heart team felt that she would not be an ideal candidate for bypass surgery with valve replacement so LAD PCI was performed on 01/29/2023 with plans for TAVR evaluation.  She had successful LAD PCI and was planned for discharge on 01/31/2023 however she had VT/VF arrest requiring multiple shocks without CPR.  ECG with LBBB.  She had polymorphic VT/VF and underwent repeat coronary angiography which revealed LAD stent thrombosis which was treated with aspiration thrombectomy and IVUS guided balloon dilation.  She had recurrent pulseless VT within 24 hours postprocedure and was taken back to the Cath Lab for repeat LAD PCI.  With ongoing VT and hemodynamic instability she was supported with IABP.  She ultimately underwent inpatient TAVR with a 26 mm SAPIEN 3 valve.  After a prolonged hospital course she eventually was discharged.   Today she presents to clinic for evaluation of ICD implant.  We had a long discussion regarding the indication for ICD implant and the procedural details.  I spoke with her daughter, Madison West, over the phone as well.  She understandably has a lot of anxiety related to heart care with all of her issues surrounding her last prolonged hospitalization.  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  She stopped doing this during COVID.  She has always worked multiple jobs and currently [employer] several nights a week.  She has 2 daughters Madison West and [address]  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  ROS: none   Studies Reviewed  ECG review 03/06/24: NSR 64,  PR 248, QRS 98, QT/c 388/400, 1' AVB, LAFB, septal infarct  02/29/24: NSR 87, PR 215, QRS 103, QT/c 385/464  TTE Result date: 02/14/24 1. Left ventricular ejection fraction, by estimation, is 35%. The left ventricle has normal function. The left ventricle demonstrates regional wall motion abnormalities (see scoring diagram/findings for description). The left ventricular internal cavity size was moderately to severely dilated. There is moderate left ventricular hypertrophy. Left ventricular diastolic parameters are indeterminate. 2. Right ventricular systolic function is normal. The right ventricular size is normal. 3. Left atrial size was moderately dilated. 4. The mitral valve is abnormal. Trivial mitral valve regurgitation. No evidence of mitral stenosis. Moderate mitral annular calcification. 5. 26 mm Sapien TAVR valve no PVL mean gradient 6 peak 10.3 mmHg AVA 2.2 cm2. The aortic valve has been repaired/replaced. There is mild calcification of the aortic valve. There is mild thickening of the aortic valve. Aortic valve regurgitation is not visualized. Aortic valve sclerosis is present, with no evidence of aortic valve stenosis. There is a 26 mm Edwards Sapien prosthetic (TAVR) valve present in the aortic position. Procedure Date: 02/04/2023. 6. The inferior vena cava is normal in size with greater than 50% respiratory variability, suggesting right atrial pressure of 3 mmHg.  Myocardial perfusion imaging  Result date: 01/03/24   A Bruce protocol stress test was performed. Exercise capacity was moderately impaired. Patient exercised for 5 min. Maximum HR of 130 bpm. MPHR 86.0%. Peak METS 7.0. The patient experienced non-limiting angina during the test. The patient reported dyspnea and chest pain during the stress test. Normal blood pressure and normal heart rate response noted during stress. Heart rate recovery was normal.   No ST deviation was noted. ECG was interpretable and conclusive. The ECG  was negative for ischemia.   LV perfusion is abnormal. There is no evidence of ischemia. There is evidence of infarction. Defect 1: There is a large defect with severe reduction in uptake present in the apical to basal anteroseptal and inferoseptal location(s) that is fixed. There is abnormal wall motion in the defect area. Consistent with infarction. Defect 2: There is a small defect with severe reduction in uptake present in the apical to mid anterior location(s) that is fixed. There is abnormal wall motion in the defect area. Consistent with infarction. Defect 3: There is a  with absent uptake present in the apical apex location(s) that is fixed. There is abnormal wall motion in the defect area. Consistent with infarction.   Left ventricular function is abnormal. Global function is moderately reduced. Nuclear stress EF: 37%. The left ventricular ejection fraction is moderately decreased (30-44%). End diastolic cavity size is moderately enlarged. End systolic cavity size is moderately enlarged. No evidence of transient ischemic dilation (TID) noted.   Coronary calcium  was present on the attenuation correction CT images. Severe coronary calcifications were present. Coronary calcifications were present in the left anterior descending artery and left circumflex artery distribution(s).   Prior study not available for comparison.   Findings are consistent with infarction. The study is high risk.  IABP placement Result date: 02/02/23 1. Ultrasound guided right common femoral artery  access 2. Intraaortic balloon pump placement  Physical Exam VS:  BP (!) 140/72   Pulse 82   Ht 5' 8 (1.727 m)   Wt 192 lb (87.1 kg)   SpO2 96%   BMI 29.19 kg/m       Wt Readings from Last 3 Encounters:  03/17/24 192 lb (87.1 kg)  03/06/24 192 lb 9.6 oz (87.4 kg)  03/01/24 191 lb 12.8 oz (87 kg)    GEN: Well nourished, well developed in no acute distress HEENT: R upper molar fractured without evidence of infection,  partial decay  CARDIAC: RRR, no murmurs, rubs, gallops RESPIRATORY:  Clear to auscultation without rales, wheezing or rhonchi  ABDOMEN: Soft, non-tender, non-distended EXTREMITIES:  No edema; No deformity   ASSESSMENT AND PLAN Diamonds Lippard is a 68 y.o. female with CAD, ICM/HFrEF, VF arrest in the setting of cardiogenic shock 2/2 acute stent thrombosis, SVT (08/03/16, terminated with ADO), severe AS s/p TAVR, NYHA class II symptoms, HLD, prior provoked DVT, asx carotid artery disease s/p right CEA (06/09/19), and prior breast cancer s/p chemotx/radiation 2009 who presents for evaluation of 1' prevention ICD implant.  CAD HFrEF ICM Prior VF arrest 2/2 acute stent thrombosis  Prior SVT We had a long discussion today regarding the benefits and risk of ICD implantation.  Although Ms. Kerrick had prior VT/VF arrest it was PM VT following acute stent thrombosis and within 24 hours of revascularization.  Despite being on maximum tolerated doses of GDMT she continues to have a severely reduced EF of 35% in the setting of ischemic cardiomyopathy.  She is otherwise extremely functional and still works.  She has a distant history of terminated with ADO with heart rates in the 180s.  She has not had any episodes similar to this recently but has noticed one recent isolated episode of heart rate in the 130s.  She has first-degree AV block, LAFB and asymptomatic bradycardia right now.  For both SVT versus VT discrimination and with some baseline conduction abnormalities I would proceed with a dual-chamber as opposed to single-chamber primary prevention ICD.  We reviewed the risk including incisional site pain, bleeding, infection, damage to the lung and/or heart requiring drain and/or surgery, damage to the valve, dangerous or life-threatening arrhythmias and death.  She and her daughter understand these risk and would like to proceed.  She has a right upper molar that is fractured and although not acutely  infected she was planning to have removed.  I encouraged her to get this done preprocedure so that she could recover and there is less of a chance of transient bacteremia post device implant.  She is going to try to coordinate this in the next few weeks.  Pre procedural details:  LEFT BSCI DC ICD, 05/01/24 Continue all medications uninterrupted including plavix  (take morning of procedure)  Anesthesia/GA* (She is extremely high anxiety, on intermittent chronic benzos and has issues with fentanyl /versed  with sedation in the past, specifically requested alternative to lido/fentanyl /versed )  Overnight obs, requested by patient with her complex heart history    Dispo: f/u post device implant   A total of 90 minutes was spent preparing for the patient, reviewing history, performing exam, document encounter, coordinating care and counseling the patient. 50 minutes was spent with direct patient care.   Signed, Donnice DELENA Primus, MD

## 2024-03-17 NOTE — Patient Instructions (Signed)
 Medication Instructions:  Your physician recommends that you continue on your current medications as directed. Please refer to the Current Medication list given to you today.  *If you need a refill on your cardiac medications before your next appointment, please call your pharmacy*  Lab Work: None ordered.  If you have labs (blood work) drawn today and your tests are completely normal, you will receive your results only by: MyChart Message (if you have MyChart) OR A paper copy in the mail If you have any lab test that is abnormal or we need to change your treatment, we will call you to review the results.  Testing/Procedures:  We will plan for ICD implant on 05/01/2024.  We will contact you with instructions. Your physician has recommended that you have a defibrillator inserted. An implantable cardioverter defibrillator (ICD) is a small device that is placed in your chest or, in rare cases, your abdomen. This device uses electrical pulses or shocks to help control life-threatening, irregular heartbeats that could lead the heart to suddenly stop beating (sudden cardiac arrest). Leads are attached to the ICD that goes into your heart. This is done in the hospital and usually requires an overnight stay. Please see the instruction sheet given to you today for more information.   Follow-Up: At [facility], you and your health needs are our priority.  As part of our continuing mission to provide you with exceptional heart care, our providers are all part of one team.  This team includes your primary Cardiologist (physician) and Advanced Practice Providers or APPs (Physician Assistants and Nurse Practitioners) who all work together to provide you with the care you need, when you need it.  Your next appointment:   To be scheduled  We recommend signing up for the patient portal called MyChart.  Sign up information is provided on this After Visit Summary.  MyChart is used to connect with  patients for Virtual Visits (Telemedicine).  Patients are able to view lab/test results, encounter notes, upcoming appointments, etc.  Non-urgent messages can be sent to your provider as well.   To learn more about what you can do with MyChart, go to [email or web address].

## 2024-03-20 ENCOUNTER — Ambulatory Visit (HOSPITAL_COMMUNITY)
Admission: RE | Admit: 2024-03-20 | Discharge: 2024-03-20 | Disposition: A | Discharge status: Home / Self Care | Source: Ambulatory Visit | Attending: Cardiology | Admitting: Cardiology

## 2024-03-20 ENCOUNTER — Ambulatory Visit: Payer: Self-pay | Admitting: Cardiology

## 2024-03-20 DIAGNOSIS — M79605 Pain in left leg: Secondary | ICD-10-CM | POA: Diagnosis not present

## 2024-03-24 ENCOUNTER — Ambulatory Visit: Admitting: Pulmonary Disease

## 2024-03-29 NOTE — Telephone Encounter (Signed)
 I have sent my chart messages to the preop APP, see notes from yesterday.   I have reviewed the DDS clearance request and looks to be the pt was cleared by Josefa Beauvais, FNP. I will review with the preop APP to confirm if pt has been cleared. If so I will be happy to re-fax notes to the dental office.

## 2024-03-29 NOTE — Telephone Encounter (Signed)
 Dental clearance request is in the chart date 02/28/24,

## 2024-04-03 ENCOUNTER — Other Ambulatory Visit: Payer: Self-pay

## 2024-04-03 ENCOUNTER — Emergency Department (HOSPITAL_BASED_OUTPATIENT_CLINIC_OR_DEPARTMENT_OTHER)
Admission: EM | Admit: 2024-04-03 | Discharge: 2024-04-03 | Disposition: A | Discharge status: Home / Self Care | Attending: Emergency Medicine | Admitting: Emergency Medicine

## 2024-04-03 ENCOUNTER — Encounter (HOSPITAL_BASED_OUTPATIENT_CLINIC_OR_DEPARTMENT_OTHER): Payer: Self-pay | Admitting: Emergency Medicine

## 2024-04-03 DIAGNOSIS — N644 Mastodynia: Secondary | ICD-10-CM | POA: Insufficient documentation

## 2024-04-03 DIAGNOSIS — M549 Dorsalgia, unspecified: Secondary | ICD-10-CM | POA: Diagnosis present

## 2024-04-03 DIAGNOSIS — Z79899 Other long term (current) drug therapy: Secondary | ICD-10-CM | POA: Diagnosis not present

## 2024-04-03 DIAGNOSIS — Z853 Personal history of malignant neoplasm of breast: Secondary | ICD-10-CM | POA: Insufficient documentation

## 2024-04-03 MED ORDER — VALACYCLOVIR HCL 1 G PO TABS: 1000.0000 mg | ORAL_TABLET | Freq: Three times a day (TID) | ORAL | 0 refills | Status: DC

## 2024-04-03 MED ORDER — DOXYCYCLINE HYCLATE 100 MG PO CAPS: 100.0000 mg | ORAL_CAPSULE | Freq: Two times a day (BID) | ORAL | 0 refills | Status: DC

## 2024-04-03 NOTE — ED Provider Notes (Signed)
 [place] [facility] Provider Note   CSN: [id number] Arrival date & time: 04/03/24  1553     Patient presents with: Back Pain   Madison West is a 68 y.o. female.   68 yo F with a chief complaints of right-sided back pain.  She says it wraps around from the back into the right side of the breast and when she feels in that area it feels different than the other side.  Going on for a few days now.  She thinks it is reminiscent of when she had cellulitis to her breast before.  Has had breast cancer on that side and had all the lymph nodes removed.  No fevers.  No cough congestion no difficulty breathing.  No trauma.  She does have chronic back and neck pain but thinks that this feels different.  Describes it more of a numb and different feeling.   Back Pain      Prior to Admission medications   Medication Sig Start Date End Date Taking? Authorizing Provider  doxycycline  (VIBRAMYCIN ) 100 MG capsule Take 1 capsule (100 mg total) by mouth 2 (two) times daily. One po bid x 7 days 04/03/24  Yes Emil Share, DO  valACYclovir (VALTREX) 1000 MG tablet Take 1 tablet (1,000 mg total) by mouth 3 (three) times daily. 04/03/24  Yes Emil Share, DO  acetaminophen  (TYLENOL ) 325 MG tablet Take 2 tablets (650 mg total) by mouth every 6 (six) hours. 03/05/23   Setzer, Sandra J, PA-C  alprazolam  (XANAX ) 2 MG tablet Take 0.25-2 mg by mouth See admin instructions. Take 1 mg (may take up to 2 mg) twice daily. May also take 0.25 mg during the day if needed for anxiety.    [provider]  amoxicillin  (AMOXIL ) 500 MG tablet Take 4 tablets (2,000 mg total) by mouth as directed. 1 HOUR PRIOR TO DENTAL PROCEDURES, INCLUDING CLEANINGS 02/14/24   Sebastian Lamarr SAUNDERS, PA-C  Bisoprolol  Fumarate 2.5 MG TABS Take 2.5 mg by mouth daily. 03/01/24   Drusilla Sabas RAMAN, MD  busPIRone  (BUSPAR ) 10 MG tablet TAKE 1 TABLET BY MOUTH TWICE A DAY 06/29/23   Raulkar, Sven SQUIBB, MD  clopidogrel   (PLAVIX ) 75 MG tablet Take 1 tablet (75 mg total) by mouth daily. 12/13/23   Patwardhan, Newman PARAS, MD  dapagliflozin  propanediol (FARXIGA ) 10 MG TABS tablet Take 1 tablet (10 mg total) by mouth daily before breakfast. 12/13/23   Patwardhan, Newman PARAS, MD  furosemide  (LASIX ) 20 MG tablet Take 0.5 tablets (10 mg total) by mouth daily as needed (for lower extremity swelling and weight gain). 12/13/23   Patwardhan, Newman PARAS, MD  losartan  (COZAAR ) 25 MG tablet Take 1 tablet (25 mg total) by mouth daily. 12/13/23   Patwardhan, Newman PARAS, MD  methocarbamol  (ROBAXIN ) 500 MG tablet Take 1 tablet (500 mg total) by mouth 2 (two) times daily. 06/04/23   Raulkar, Sven SQUIBB, MD  nitroGLYCERIN  (NITROSTAT ) 0.4 MG SL tablet Place 1 tablet (0.4 mg total) under the tongue every 5 (five) minutes as needed for chest pain. 03/15/23   Raulkar, Sven SQUIBB, MD  oxyCODONE  (ROXICODONE ) 5 MG immediate release tablet Take 1 tablet (5 mg total) by mouth daily as needed for severe pain (pain score 7-10). 07/05/23   Raulkar, Sven SQUIBB, MD  rosuvastatin  (CRESTOR ) 10 MG tablet Take 1 tablet (10 mg total) by mouth daily. 12/13/23   Patwardhan, Newman PARAS, MD  spironolactone  (ALDACTONE ) 25 MG tablet Take 1 tablet (25 mg total)  by mouth daily. 12/13/23   Patwardhan, Newman PARAS, MD    Allergies: Prednisone, Fentanyl , Versed  [midazolam ], and Vicodin [hydrocodone -acetaminophen ]    Review of Systems  Musculoskeletal:  Positive for back pain.    Updated Vital Signs BP (!) 143/101 (BP Location: Left Arm)   Pulse 83   Temp 97.8 F (36.6 C)   Resp 18   SpO2 95%   Physical Exam Vitals and nursing note reviewed.  Constitutional:      General: She is not in acute distress.    Appearance: She is well-developed. She is not diaphoretic.  HENT:     Head: Normocephalic and atraumatic.  Eyes:     Pupils: Pupils are equal, round, and reactive to light.  Cardiovascular:     Rate and Rhythm: Normal rate and regular rhythm.     Heart sounds: No murmur  heard.    No friction rub. No gallop.  Pulmonary:     Effort: Pulmonary effort is normal.     Breath sounds: No wheezing or rales.  Abdominal:     General: There is no distension.     Palpations: Abdomen is soft.     Tenderness: There is no abdominal tenderness.  Musculoskeletal:        General: No tenderness.     Cervical back: Normal range of motion and neck supple.     Comments: I do not appreciate any rash along the area in question.  No erythema no warmth.  She thinks it feels little bit different on the right lateral aspect of the breast compared to the left.  I do not feel any obvious lumps, nontender no fluctuance no induration.  She does have some paraspinal tenderness about the T-spine corresponding to that region.  Skin:    General: Skin is warm and dry.  Neurological:     Mental Status: She is alert and oriented to person, place, and time.  Psychiatric:        Behavior: Behavior normal.     (all labs ordered are listed, but only abnormal results are displayed) Labs Reviewed - No data to display  EKG: None  Radiology: No results found.   Procedures   Medications Ordered in the ED - No data to display                                  Medical Decision Making Risk Prescription drug management.   68 yo F with right sided back pain.  Wraps around to her chest.  Could be early shingles.  Patient is worried that maybe it is early cellulitis.  Has a history of recurrent cellulitis.  Also has a remote history of breast cancer.  Long discussion with the patient at bedside.  I am not sure imaging at this point would be beneficial.  I do not see any obvious signs of cellulitis.  As this is her primary worry I will start her on oral antibiotics.  I did also encourage her to maybe try a watch and wait strategy.  By history it sounds more like shingles.  She has an intolerance to steroids so we will prescribe valacyclovir.  With her history of breast cancer I  encouraged her to reach out to her oncologist and perhaps they would want to do further imaging of the breast if she felt like she had a new area that felt different than the other side.  5:14 PM:  I have discussed the diagnosis/risks/treatment options with the patient.  Evaluation and diagnostic testing in the emergency department does not suggest an emergent condition requiring admission or immediate intervention beyond what has been performed at this time.  They will follow up with PCP, oncology. We also discussed returning to the ED immediately if new or worsening sx occur. We discussed the sx which are most concerning (e.g., sudden worsening pain, fever, inability to tolerate by mouth) that necessitate immediate return. Medications administered to the patient during their visit and any new prescriptions provided to the patient are listed below.  Medications given during this visit Medications - No data to display   The patient appears reasonably screen and/or stabilized for discharge and I doubt any other medical condition or other [facility] requiring further screening, evaluation, or treatment in the ED at this time prior to discharge.       Final diagnoses:  Breast pain, right    ED Discharge Orders          Ordered    valACYclovir (VALTREX) 1000 MG tablet  3 times daily        04/03/24 1710    doxycycline  (VIBRAMYCIN ) 100 MG capsule  2 times daily        04/03/24 1710               Emil Share, DO 04/03/24 1714

## 2024-04-03 NOTE — Discharge Instructions (Signed)
 Please follow-up with your oncologist and family doctor.  Please keep an eye on this area.  If you develop a rash then it could be shingles.  I did prescribe you an antiviral medicine to help you with this.  I have also prescribed you antibiotics with your history of recurrent cellulitis.  Please return for fever or difficulty breathing.

## 2024-04-03 NOTE — ED Triage Notes (Addendum)
 Back rt side numbness started yesaterday has hx of breast cancer that side  pt drove herself to hospital  no fever ,hurt under arm last night    Had tooth pulled last wed

## 2024-04-10 ENCOUNTER — Telehealth: Payer: Self-pay

## 2024-04-10 ENCOUNTER — Encounter (HOSPITAL_COMMUNITY): Payer: Self-pay

## 2024-04-10 ENCOUNTER — Telehealth (HOSPITAL_COMMUNITY): Payer: Self-pay

## 2024-04-10 NOTE — Telephone Encounter (Signed)
 Spoke with patient to complete pre-procedure call.     Health status review:  Any new medical conditions, recent signs of acute illness or been started on antibiotics? Patient seen in ED on 10/13 for right-sided back and breast pain. Patient realized that her discomfort was probably due to muscle strain from opening a difficult, stand-up cooler door at work. Symptoms resolved a couple of days after non-use. She never started the valacyclovir or doxycycline .  Patient reports she had dental extraction completed 2 weeks ago as recommended by Dr. Almetta and has healed up completely.  Follow all medication instructions prior to procedure or the procedure may be rescheduled:    HOLD Farxiga  for 3 days prior to your procedure. Last dose on November 6.  HOLD Furosemide  (Lasix ) and Spironolactone  (Aldactone ) the morning of your procedure.  On the morning of your procedure, you may take all other morning medications NOT discussed with sips of water.  The night before your procedure and the morning of your procedure, wash thoroughly with the CHG surgical soap from the neck down, paying special attention to the area where your procedure will be performed.  Nothing to eat or drink after midnight prior to your procedure.  Pre-procedure testing scheduled: lab work on October 21.  Confirmed patient is scheduled for  Implantable cardioverter defibrilator (ICD) on Monday, November 10 with Dr. Dr. Almetta. Instructed patient to arrive at the Main Entrance A at [facility]: [address] [place], [place] [address] and check in at Admitting at 10:30 AM.  You will be staying overnight at the hospital. Please bring necessary items with you. . You MUST have a responsible adult to drive you home and MUST be with you the first 24 hours after you arrive home or your procedure could be cancelled.  Informed patient a nurse will call a day before the procedure to confirm arrival time and ensure instructions are  followed.  Patient verbalized understanding to information provided and is agreeable to proceed with procedure.   Advised patient to contact RN Navigator at [phone], to inform of any new medications started after call or concerns prior to procedure.

## 2024-04-10 NOTE — Telephone Encounter (Signed)
-----   Message from Nurse Aldona R sent at 03/17/2024  1:29 PM EDT ----- Regarding: 11/10 Duall Chamber ICD [place] Important: list procedure date as first item in subject line, followed by procedure type (e.g., 03/03/24 PPM implant)  Precert:  MD: Almetta Type of implant: ICD Device manufacturer: [employer] Diagnosis: cardiomyopathy and Systolic CHF CPT code: ICD implant - 33249 C-code(s), including quantity (if indicated): 1721, 1898 x1 and 1777 x1 Procedure scheduled (date/time): 11/10 1230  Procedure:Dual Chamber ICD  Scrub given? Yes  Medication instructions: Continue Plavix  without hold. Message sent to CVRR? No Added to calendar? Yes Orders entered? No, >30 days before procedure Letter complete? No, >30 days before procedure Scheduled with cath lab? Yes Labs ordered (CBC, BMET, PT/INR if on warfarin)? Yes Dye allergy? No Pre-meds ordered and instructions given? N/A Letter method: MyChart Special instructions: General anesthesia with Overnight Admit H&P: 9/26  Follow-up:  Cassie/Angel, please schedule Routine.  Covering RN:  Please send this message to [employer], EP scheduler, EP Scheduling pool, and EP [employer].

## 2024-04-11 ENCOUNTER — Telehealth: Payer: Self-pay | Admitting: Physical Medicine and Rehabilitation

## 2024-04-11 NOTE — Telephone Encounter (Signed)
 I have spoke with [facility] about Qutenza patches that were charged but the charge was reversed on Aug 28.  Her insurance has been credited.

## 2024-04-12 LAB — CBC
Hematocrit: 40.1 % (ref 34.0–46.6)
Hemoglobin: 12.9 g/dL (ref 11.1–15.9)
MCH: 27.7 pg (ref 26.6–33.0)
MCHC: 32.2 g/dL (ref 31.5–35.7)
MCV: 86 fL (ref 79–97)
Platelets: 456 x10E3/uL — ABNORMAL HIGH (ref 150–450)
RBC: 4.65 x10E6/uL (ref 3.77–5.28)
RDW: 15.7 % — ABNORMAL HIGH (ref 11.7–15.4)
WBC: 8.8 x10E3/uL (ref 3.4–10.8)

## 2024-04-12 LAB — BASIC METABOLIC PANEL WITH GFR
BUN/Creatinine Ratio: 19 (ref 12–28)
BUN: 15 mg/dL (ref 8–27)
CO2: 20 mmol/L (ref 20–29)
Calcium: 9.3 mg/dL (ref 8.7–10.3)
Chloride: 98 mmol/L (ref 96–106)
Creatinine, Ser: 0.79 mg/dL (ref 0.57–1.00)
Glucose: 94 mg/dL (ref 70–99)
Potassium: 4.6 mmol/L (ref 3.5–5.2)
Sodium: 135 mmol/L (ref 134–144)
eGFR: 80 mL/min/1.73 (ref >59)

## 2024-04-20 ENCOUNTER — Encounter: Payer: Self-pay | Admitting: Internal Medicine

## 2024-04-25 ENCOUNTER — Encounter: Payer: Self-pay | Admitting: Internal Medicine

## 2024-04-25 DIAGNOSIS — I25118 Atherosclerotic heart disease of native coronary artery with other forms of angina pectoris: Secondary | ICD-10-CM

## 2024-04-25 NOTE — Telephone Encounter (Signed)
 Left message for patient to call back

## 2024-04-25 NOTE — Telephone Encounter (Signed)
 Patient is calling with question concerning her medication. Please advise

## 2024-04-27 ENCOUNTER — Telehealth (HOSPITAL_COMMUNITY): Payer: Self-pay

## 2024-04-27 NOTE — Telephone Encounter (Signed)
 Spoke with patient and advised that she should take all morning medications with a small sip of water except holding Farxiga  for 3 days prior and Furosemide  and Spironolactone  the morning of your procedure. OK to take Xanax  the morning of procedure, as long as she is coherent enough to consent for anesthesia. States she has been taking Xanax  for over 20 years and medication does not cause an issue for her to be able to sign consent form. All questions answered and pt voiced understanding.

## 2024-04-27 NOTE — Telephone Encounter (Signed)
 This looks to be addressed in MyChart message 04/25/24.

## 2024-04-30 NOTE — Pre-Procedure Instructions (Signed)
 Instructed patient on the following items: Arrival time 730, new arrival time Nothing to eat or drink after midnight No meds AM of procedure Responsible person to drive you home and stay with you for 24 hrs Wash with special soap night before and morning of procedure

## 2024-05-01 ENCOUNTER — Other Ambulatory Visit: Payer: Self-pay

## 2024-05-01 ENCOUNTER — Ambulatory Visit (HOSPITAL_COMMUNITY): Admitting: Anesthesiology

## 2024-05-01 ENCOUNTER — Encounter (HOSPITAL_COMMUNITY): Payer: Self-pay | Admitting: Student in an Organized Health Care Education/Training Program

## 2024-05-01 ENCOUNTER — Ambulatory Visit (HOSPITAL_COMMUNITY)
Admission: RE | Admit: 2024-05-01 | Discharge: 2024-05-02 | Disposition: A | Discharge status: Home / Self Care | Attending: Student in an Organized Health Care Education/Training Program | Admitting: Student in an Organized Health Care Education/Training Program

## 2024-05-01 ENCOUNTER — Encounter (HOSPITAL_COMMUNITY)
Admission: RE | Disposition: A | Discharge status: Home / Self Care | Payer: Self-pay | Source: Home / Self Care | Attending: Student in an Organized Health Care Education/Training Program

## 2024-05-01 DIAGNOSIS — Z7902 Long term (current) use of antithrombotics/antiplatelets: Secondary | ICD-10-CM | POA: Insufficient documentation

## 2024-05-01 DIAGNOSIS — Z955 Presence of coronary angioplasty implant and graft: Secondary | ICD-10-CM | POA: Diagnosis not present

## 2024-05-01 DIAGNOSIS — I252 Old myocardial infarction: Secondary | ICD-10-CM | POA: Insufficient documentation

## 2024-05-01 DIAGNOSIS — I739 Peripheral vascular disease, unspecified: Secondary | ICD-10-CM | POA: Diagnosis not present

## 2024-05-01 DIAGNOSIS — Z87891 Personal history of nicotine dependence: Secondary | ICD-10-CM

## 2024-05-01 DIAGNOSIS — Z8674 Personal history of sudden cardiac arrest: Secondary | ICD-10-CM | POA: Insufficient documentation

## 2024-05-01 DIAGNOSIS — I255 Ischemic cardiomyopathy: Secondary | ICD-10-CM

## 2024-05-01 DIAGNOSIS — I25119 Atherosclerotic heart disease of native coronary artery with unspecified angina pectoris: Secondary | ICD-10-CM | POA: Diagnosis not present

## 2024-05-01 DIAGNOSIS — I5022 Chronic systolic (congestive) heart failure: Secondary | ICD-10-CM

## 2024-05-01 DIAGNOSIS — Z95810 Presence of automatic (implantable) cardiac defibrillator: Secondary | ICD-10-CM | POA: Diagnosis present

## 2024-05-01 DIAGNOSIS — Z8679 Personal history of other diseases of the circulatory system: Secondary | ICD-10-CM | POA: Insufficient documentation

## 2024-05-01 DIAGNOSIS — I110 Hypertensive heart disease with heart failure: Secondary | ICD-10-CM | POA: Diagnosis not present

## 2024-05-01 DIAGNOSIS — I2510 Atherosclerotic heart disease of native coronary artery without angina pectoris: Secondary | ICD-10-CM | POA: Insufficient documentation

## 2024-05-01 DIAGNOSIS — Z86718 Personal history of other venous thrombosis and embolism: Secondary | ICD-10-CM | POA: Diagnosis not present

## 2024-05-01 HISTORY — PX: ICD IMPLANT: EP1208

## 2024-05-01 MED ORDER — FENTANYL CITRATE (PF) 100 MCG/2ML IJ SOLN
INTRAMUSCULAR | Status: DC | PRN
  Administered 2024-05-01: 100 ug via INTRAVENOUS

## 2024-05-01 MED ORDER — OXYCODONE HCL 5 MG PO TABS
10.0000 mg | ORAL_TABLET | Freq: Once | ORAL | Status: AC
Start: 2024-05-01 — End: 2024-05-01
  Administered 2024-05-01: 10 mg via ORAL

## 2024-05-01 MED ORDER — ALPRAZOLAM 0.5 MG PO TABS
0.5000 mg | ORAL_TABLET | Freq: Once | ORAL | Status: AC
  Administered 2024-05-01: 0.5 mg via ORAL
  Filled 2024-05-01: qty 1

## 2024-05-01 MED ORDER — ACETAMINOPHEN 325 MG PO TABS: 650.0000 mg | ORAL_TABLET | Freq: Once | ORAL | Status: DC

## 2024-05-01 MED ORDER — EPHEDRINE SULFATE-NACL 50-0.9 MG/10ML-% IV SOSY
PREFILLED_SYRINGE | INTRAVENOUS | Status: DC | PRN
  Administered 2024-05-01: 2.5 mg via INTRAVENOUS
  Administered 2024-05-01: 5 mg via INTRAVENOUS

## 2024-05-01 MED ORDER — ALPRAZOLAM 0.25 MG PO TABS
1.0000 mg | ORAL_TABLET | Freq: Three times a day (TID) | ORAL | Status: DC | PRN
  Administered 2024-05-01: 2 mg via ORAL
  Administered 2024-05-02 (×2): 1 mg via ORAL
  Administered 2024-05-02: 2 mg via ORAL
  Filled 2024-05-01 (×2): qty 4
  Filled 2024-05-01 (×2): qty 8

## 2024-05-01 MED ORDER — LIDOCAINE HCL 1 % IJ SOLN
INTRAMUSCULAR | Status: AC
  Filled 2024-05-01: qty 60

## 2024-05-01 MED ORDER — IPRATROPIUM-ALBUTEROL 0.5-2.5 (3) MG/3ML IN SOLN: 3.0000 mL | Freq: Once | RESPIRATORY_TRACT | Status: DC

## 2024-05-01 MED ORDER — SUGAMMADEX SODIUM 200 MG/2ML IV SOLN
INTRAVENOUS | Status: DC | PRN
  Administered 2024-05-01: 200 mg via INTRAVENOUS

## 2024-05-01 MED ORDER — POVIDONE-IODINE 10 % EX SWAB
2.0000 | Freq: Once | CUTANEOUS | Status: AC
  Administered 2024-05-01: 2 via TOPICAL

## 2024-05-01 MED ORDER — DAPAGLIFLOZIN PROPANEDIOL 10 MG PO TABS
10.0000 mg | ORAL_TABLET | Freq: Every day | ORAL | Status: DC
  Administered 2024-05-02: 10 mg via ORAL
  Filled 2024-05-01: qty 1

## 2024-05-01 MED ORDER — SPIRONOLACTONE 25 MG PO TABS
25.0000 mg | ORAL_TABLET | Freq: Every day | ORAL | Status: DC
  Administered 2024-05-02: 25 mg via ORAL
  Filled 2024-05-01: qty 1

## 2024-05-01 MED ORDER — CHLORHEXIDINE GLUCONATE 4 % EX SOLN
4.0000 | Freq: Once | CUTANEOUS | Status: DC
  Filled 2024-05-01: qty 60

## 2024-05-01 MED ORDER — LOSARTAN POTASSIUM 25 MG PO TABS
25.0000 mg | ORAL_TABLET | Freq: Every day | ORAL | Status: DC
  Administered 2024-05-02: 25 mg via ORAL
  Filled 2024-05-01: qty 1

## 2024-05-01 MED ORDER — SODIUM CHLORIDE 0.9 % IV SOLN
INTRAVENOUS | Status: AC
  Administered 2024-05-01: 80 mg
  Filled 2024-05-01: qty 2

## 2024-05-01 MED ORDER — PROPOFOL 500 MG/50ML IV EMUL
INTRAVENOUS | Status: DC | PRN
  Administered 2024-05-01: 125 ug/kg/min via INTRAVENOUS
  Administered 2024-05-01: 150 mg via INTRAVENOUS
  Administered 2024-05-01: 30 mg via INTRAVENOUS

## 2024-05-01 MED ORDER — ACETAMINOPHEN 325 MG PO TABS
325.0000 mg | ORAL_TABLET | ORAL | Status: DC | PRN
  Administered 2024-05-01 (×2): 325 mg via ORAL
  Administered 2024-05-02: 650 mg via ORAL
  Filled 2024-05-01 (×2): qty 2

## 2024-05-01 MED ORDER — CEFAZOLIN SODIUM-DEXTROSE 2-4 GM/100ML-% IV SOLN
INTRAVENOUS | Status: AC
  Filled 2024-05-01: qty 100

## 2024-05-01 MED ORDER — SODIUM CHLORIDE 0.9 % IV SOLN: 80.0000 mg | INTRAVENOUS | Status: AC

## 2024-05-01 MED ORDER — ACETAMINOPHEN 325 MG PO TABS
ORAL_TABLET | ORAL | Status: AC
  Filled 2024-05-01: qty 2

## 2024-05-01 MED ORDER — ALUM & MAG HYDROXIDE-SIMETH 200-200-20 MG/5ML PO SUSP
30.0000 mL | Freq: Once | ORAL | Status: AC
  Administered 2024-05-01: 30 mL via ORAL
  Filled 2024-05-01: qty 30

## 2024-05-01 MED ORDER — ONDANSETRON HCL 4 MG/2ML IJ SOLN
INTRAMUSCULAR | Status: DC | PRN
  Administered 2024-05-01: 4 mg via INTRAVENOUS

## 2024-05-01 MED ORDER — LIDOCAINE HCL (PF) 1 % IJ SOLN
INTRAMUSCULAR | Status: DC | PRN
  Administered 2024-05-01: 30 mL

## 2024-05-01 MED ORDER — BISOPROLOL FUMARATE 5 MG PO TABS
2.5000 mg | ORAL_TABLET | Freq: Every day | ORAL | Status: DC
  Administered 2024-05-02: 2.5 mg via ORAL
  Filled 2024-05-01: qty 1

## 2024-05-01 MED ORDER — OXYCODONE HCL 5 MG PO TABS
ORAL_TABLET | ORAL | Status: AC
  Filled 2024-05-01: qty 2

## 2024-05-01 MED ORDER — CEFAZOLIN SODIUM-DEXTROSE 2-4 GM/100ML-% IV SOLN
2.0000 g | INTRAVENOUS | Status: AC
  Administered 2024-05-01: 2 g via INTRAVENOUS

## 2024-05-01 MED ORDER — METHOCARBAMOL 500 MG PO TABS
500.0000 mg | ORAL_TABLET | Freq: Two times a day (BID) | ORAL | Status: DC
  Administered 2024-05-01 – 2024-05-02 (×2): 500 mg via ORAL
  Filled 2024-05-01 (×2): qty 1

## 2024-05-01 MED ORDER — MIDAZOLAM HCL (PF) 2 MG/2ML IJ SOLN
INTRAMUSCULAR | Status: DC | PRN
  Administered 2024-05-01: 2 mg via INTRAVENOUS

## 2024-05-01 MED ORDER — ROCURONIUM BROMIDE 10 MG/ML (PF) SYRINGE
PREFILLED_SYRINGE | INTRAVENOUS | Status: DC | PRN
  Administered 2024-05-01: 40 mg via INTRAVENOUS
  Administered 2024-05-01: 10 mg via INTRAVENOUS

## 2024-05-01 MED ORDER — METHOCARBAMOL 500 MG PO TABS
500.0000 mg | ORAL_TABLET | Freq: Once | ORAL | Status: AC
  Administered 2024-05-01: 500 mg via ORAL
  Filled 2024-05-01: qty 1

## 2024-05-01 MED ORDER — DIPHENHYDRAMINE HCL 50 MG/ML IJ SOLN
INTRAMUSCULAR | Status: AC
  Filled 2024-05-01: qty 1

## 2024-05-01 MED ORDER — SODIUM CHLORIDE 0.9 % IV SOLN: INTRAVENOUS | Status: DC

## 2024-05-01 MED ORDER — OXYCODONE HCL 5 MG PO TABS
5.0000 mg | ORAL_TABLET | Freq: Four times a day (QID) | ORAL | Status: DC | PRN
  Administered 2024-05-01 – 2024-05-02 (×4): 5 mg via ORAL
  Filled 2024-05-01 (×5): qty 1

## 2024-05-01 MED ORDER — PHENYLEPHRINE HCL-NACL 20-0.9 MG/250ML-% IV SOLN
INTRAVENOUS | Status: DC | PRN
  Administered 2024-05-01: 50 ug/min via INTRAVENOUS

## 2024-05-01 MED ORDER — LIDOCAINE 2% (20 MG/ML) 5 ML SYRINGE
INTRAMUSCULAR | Status: DC | PRN
  Administered 2024-05-01: 50 mg via INTRAVENOUS

## 2024-05-01 MED ORDER — HEPARIN (PORCINE) IN NACL 1000-0.9 UT/500ML-% IV SOLN
INTRAVENOUS | Status: DC | PRN
  Administered 2024-05-01: 500 mL

## 2024-05-01 MED ORDER — DIPHENHYDRAMINE HCL 50 MG/ML IJ SOLN
25.0000 mg | Freq: Once | INTRAMUSCULAR | Status: AC
  Administered 2024-05-01: 25 mg via INTRAVENOUS

## 2024-05-01 NOTE — Discharge Instructions (Signed)
 After Your ICD (Implantable Cardiac Defibrillator)   You have a [employer] ICD  If you have a Medtronic or Biotronik device, plug in your home monitor once you get home, and no manual interaction is required.   If you have an Abbott or [employer]one device, plug your home monitor once you get home, sit near the device, and press the large activation button. Sit nearby until the process is complete, usually notated by lights on the monitor.   If you were set up for monitoring using an app on your phone, make sure the app remains open in the background and the Bluetooth remains on.  ACTIVITY Do not lift your arm above shoulder height for 1 week after your procedure. After 7 days, you may progress as below.  You should remove your sling 24 hours after your procedure, unless otherwise instructed by your provider.     Monday May 08, 2024  Tuesday May 09, 2024 Wednesday May 10, 2024 Thursday May 11, 2024   Do not lift, push, pull, or carry anything over 10 pounds with the affected arm until 6 weeks (Monday June 12, 2024 ) after your procedure.   You may drive AFTER your wound check, unless you have been told otherwise by your provider.   Ask your healthcare provider when you can go back to work   INCISION/Dressing Hold Plavix  post procedure. OK to resume on 05/04/24 AM.   If large square, outer bandage is left in place, this can be removed after 24 hours from your procedure. Do not remove steri-strips or glue as below.   Monitor your defibrillator site for redness, swelling, and drainage. Call the device clinic at [phone] if you experience these symptoms or fever/chills.    If your incision is sealed with Dermabond (glue), you may shower 24 hrs after your procedure or when told by your provider. Do not remove the glue or let the shower hit directly on your site. You may wash around your site with soap and water.    If you were discharged in a  sling, please do not wear this during the day more than 48 hours after your surgery unless otherwise instructed. This may increase the risk of stiffness and soreness in your shoulder.   Avoid lotions, ointments, or perfumes over your incision until it is well-healed.  You may use a hot tub or a pool AFTER your wound check appointment if the incision is completely closed.  Your ICD is designed to protect you from life threatening heart rhythms. Because of this, you may receive a shock.   1 shock with no symptoms:  Call the office during business hours. 1 shock with symptoms (chest pain, chest pressure, dizziness, lightheadedness, shortness of breath, overall feeling unwell):  Call 911. If you experience 2 or more shocks in 24 hours:  Call 911. If you receive a shock, you should not drive for 6 months per the [place] DMV IF you receive appropriate therapy from your ICD.   ICD Alerts:  Some alerts are vibratory and others beep. These are NOT emergencies. Please call our office to let us  know. If this occurs at night or on weekends, it can wait until the next business day. Send a remote transmission.  If your device is capable of reading fluid status (for heart failure), you will be offered monthly monitoring to review this with you.   DEVICE MANAGEMENT Remote monitoring is used to monitor your ICD from home. This monitoring is scheduled every  91 days by our office. It allows us  to keep an eye on the functioning of your device to ensure it is working properly. You will routinely see your Electrophysiologist annually (more often if necessary). This will appear as a REMOTE check on your MyChart schedule. These are automatic and there is nothing for you to manually do unless otherwise instructed.  You should receive your ID card for your new device in 4-8 weeks. Keep this card with you at all times once received. Consider wearing a medical alert bracelet or necklace.  Your ICD  may be MRI compatible. This  will be discussed at your next office visit/wound check.  You should avoid contact with strong electric or magnetic fields.   Do not use amateur (ham) radio equipment or electric (arc) welding torches. MP3 player headphones with magnets should not be used. Some devices are safe to use if held at least 12 inches (30 cm) from your defibrillator. These include power tools, lawn mowers, and speakers. If you are unsure if something is safe to use, ask your health care provider.  When using your cell phone, hold it to the ear that is on the opposite side from the defibrillator. Do not leave your cell phone in a pocket over the defibrillator.  You may safely use electric blankets, heating pads, computers, and microwave ovens.  Call the office right away if: You have chest pain. You feel more than one shock. You feel more short of breath than you have felt before. You feel more light-headed than you have felt before. Your incision starts to open up.  This information is not intended to replace advice given to you by your health care provider. Make sure you discuss any questions you have with your health care provider.

## 2024-05-01 NOTE — H&P (Signed)
 Date: 05/01/24 ID:  Madison West, DOB Mar 31, 1956, MRN [id number] PCP: Teresa Aldona CROME, NP  [facility] Providers Cardiologist:  [name], MD Electrophysiologist:  Donnice DELENA Primus, MD  Structural Heart:  Lurena MARLA Red, MD   History of Present Illness Madison West is a 68 y.o. female with CAD, ICM/HFrEF, VF arrest in the setting of cardiogenic shock 2/2 acute stent thrombosis, SVT (08/03/16, terminated with ADO), severe AS s/p TAVR, NYHA class II symptoms, HLD, prior provoked DVT, asx carotid artery disease s/p right CEA (06/09/19), and prior breast cancer s/p chemotx/radiation 2009 who presents for evaluation of 1' prevention ICD implant.   She was seen by Dr. Ladona in July 2021 when with symptoms suggestive of progressive angina.  She had an abnormal echocardiogram and nuclear stress test so was recommended to undergo coronary evaluation.  On 02/27/20 she had coronary angiography with multivessel disease of the RCA which was mild, diffusely diseased circumflex with small caliber vessels, significant disease in the ramus intermediate for which she underwent PCI x 2 with overlapping stents.   She had ongoing shortness of breath and August 2024 that was progressively worsening.  She was also having associated chest pain with exertional activities and had been under a fair amount of stress.  The chest discomfort was similar to what she had experienced prior to her PCI in 2021.  She was admitted on 01/24/2023.  She was not on statin therapy at that time with an LDL of 141 as she was trying to avoid medical therapy if possible.  She has a significant amount of anxiety related to procedures and medications.  In August 2024 her LVEF was 30-35% with an entirely hypokinetic septum and apex.  Her echo in 05/2022 had preserved LVEF.  Coronary evaluation with Dr. Lawrence with 95% mid LAD stenosis with a 40% downstream lesion that reportedly from prior with 50% diffuse  circumflex disease and 40% RCA disease that was eccentric.  Aortic valve area was 1.0 cm and an AVA index of 0.5 cm/m with a mean gradient of 29 mmHg consistent with low-flow low gradient severe AS.  Discussion with multidisciplinary heart team felt that she would not be an ideal candidate for bypass surgery with valve replacement so LAD PCI was performed on 01/29/2023 with plans for TAVR evaluation.  She had successful LAD PCI and was planned for discharge on 01/31/2023 however she had VT/VF arrest requiring multiple shocks without CPR.  ECG with LBBB.  She had polymorphic VT/VF and underwent repeat coronary angiography which revealed LAD stent thrombosis which was treated with aspiration thrombectomy and IVUS guided balloon dilation.  She had recurrent pulseless VT within 24 hours postprocedure and was taken back to the Cath Lab for repeat LAD PCI.  With ongoing VT and hemodynamic instability she was supported with IABP.  She ultimately underwent inpatient TAVR with a 26 mm SAPIEN 3 valve.  After a prolonged hospital course she eventually was discharged.    Today she presents to clinic for evaluation of ICD implant.  We had a long discussion regarding the indication for ICD implant and the procedural details.  I spoke with her daughter, Charmaine, over the phone as well.  She understandably has a lot of anxiety related to heart care with all of her issues surrounding her last prolonged hospitalization.   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  She worked  for several of the builders within the area including [employer].  She stopped doing this during COVID.  She has always worked multiple jobs and currently [employer] several nights a week.  She has 2 daughters Charmaine and [address].  Charmaine is a sonographer at a local vein clinic.   ROS: none    Studies Reviewed   ECG review 03/06/24: NSR 64, PR 248, QRS 98,  QT/c 388/400, 1' AVB, LAFB, septal infarct  02/29/24: NSR 87, PR 215, QRS 103, QT/c 385/464   TTE Result date: 02/14/24 1. Left ventricular ejection fraction, by estimation, is 35%. The left ventricle has normal function. The left ventricle demonstrates regional wall motion abnormalities (see scoring diagram/findings for description). The left ventricular internal cavity size was moderately to severely dilated. There is moderate left ventricular hypertrophy. Left ventricular diastolic parameters are indeterminate. 2. Right ventricular systolic function is normal. The right ventricular size is normal. 3. Left atrial size was moderately dilated. 4. The mitral valve is abnormal. Trivial mitral valve regurgitation. No evidence of mitral stenosis. Moderate mitral annular calcification. 5. 26 mm Sapien TAVR valve no PVL mean gradient 6 peak 10.3 mmHg AVA 2.2 cm2. The aortic valve has been repaired/replaced. There is mild calcification of the aortic valve. There is mild thickening of the aortic valve. Aortic valve regurgitation is not visualized. Aortic valve sclerosis is present, with no evidence of aortic valve stenosis. There is a 26 mm Edwards Sapien prosthetic (TAVR) valve present in the aortic position. Procedure Date: 02/04/2023. 6. The inferior vena cava is normal in size with greater than 50% respiratory variability, suggesting right atrial pressure of 3 mmHg.   Myocardial perfusion imaging  Result date: 01/03/24   A Bruce protocol stress test was performed. Exercise capacity was moderately impaired. Patient exercised for 5 min. Maximum HR of 130 bpm. MPHR 86.0%. Peak METS 7.0. The patient experienced non-limiting angina during the test. The patient reported dyspnea and chest pain during the stress test. Normal blood pressure and normal heart rate response noted during stress. Heart rate recovery was normal.   No ST deviation was noted. ECG was interpretable and conclusive. The ECG was negative for  ischemia.   LV perfusion is abnormal. There is no evidence of ischemia. There is evidence of infarction. Defect 1: There is a large defect with severe reduction in uptake present in the apical to basal anteroseptal and inferoseptal location(s) that is fixed. There is abnormal wall motion in the defect area. Consistent with infarction. Defect 2: There is a small defect with severe reduction in uptake present in the apical to mid anterior location(s) that is fixed. There is abnormal wall motion in the defect area. Consistent with infarction. Defect 3: There is a  with absent uptake present in the apical apex location(s) that is fixed. There is abnormal wall motion in the defect area. Consistent with infarction.   Left ventricular function is abnormal. Global function is moderately reduced. Nuclear stress EF: 37%. The left ventricular ejection fraction is moderately decreased (30-44%). End diastolic cavity size is moderately enlarged. End systolic cavity size is moderately enlarged. No evidence of transient ischemic dilation (TID) noted.   Coronary calcium  was present on the attenuation correction CT images. Severe coronary calcifications were present. Coronary calcifications were present in the left anterior descending artery and left circumflex artery distribution(s).   Prior study not available for comparison.   Findings are consistent with infarction. The study is high risk.   IABP placement Result date: 02/02/23 1. Ultrasound  guided right common femoral artery access 2. Intraaortic balloon pump placement   Physical Exam VS:  BP (!) 140/72   Pulse 82   Ht 5' 8 (1.727 m)   Wt 192 lb (87.1 kg)   SpO2 96%   BMI 29.19 kg/m          Wt Readings from Last 3 Encounters:  03/17/24 192 lb (87.1 kg)  03/06/24 192 lb 9.6 oz (87.4 kg)  03/01/24 191 lb 12.8 oz (87 kg)    GEN: Well nourished, well developed in no acute distress HEENT: R upper molar fractured without evidence of infection, partial  decay  CARDIAC: RRR, no murmurs, rubs, gallops RESPIRATORY:  Clear to auscultation without rales, wheezing or rhonchi  ABDOMEN: Soft, non-tender, non-distended EXTREMITIES:  No edema; No deformity    ASSESSMENT AND PLAN Maythe Deramo is a 68 y.o. female with CAD, ICM/HFrEF, VF arrest in the setting of cardiogenic shock 2/2 acute stent thrombosis, SVT (08/03/16, terminated with ADO), severe AS s/p TAVR, NYHA class II symptoms, HLD, prior provoked DVT, asx carotid artery disease s/p right CEA (06/09/19), and prior breast cancer s/p chemotx/radiation 2009 who presents for evaluation of 1' prevention ICD implant.   CAD HFrEF ICM Prior VF arrest 2/2 acute stent thrombosis  Prior SVT We had a long discussion today regarding the benefits and risk of ICD implantation.  Although Madison West had prior VT/VF arrest it was PM VT following acute stent thrombosis and within 24 hours of revascularization.  Despite being on maximum tolerated doses of GDMT she continues to have a severely reduced EF of 35% in the setting of ischemic cardiomyopathy.  She is otherwise extremely functional and still works.  She has a distant history of terminated with ADO with heart rates in the 180s.  She has not had any episodes similar to this recently but has noticed one recent isolated episode of heart rate in the 130s.  She has first-degree AV block, LAFB and asymptomatic bradycardia right now.  For both SVT versus VT discrimination and with some baseline conduction abnormalities I would proceed with a dual-chamber as opposed to single-chamber primary prevention ICD.  We reviewed the risk including incisional site pain, bleeding, infection, damage to the lung and/or heart requiring drain and/or surgery, damage to the valve, dangerous or life-threatening arrhythmias and death.  She and her daughter understand these risk and would like to proceed.  She has a right upper molar that is fractured and although not acutely infected  she was planning to have removed.  I encouraged her to get this done preprocedure so that she could recover and there is less of a chance of transient bacteremia post device implant.  She is going to try to coordinate this in the next few weeks.   Pre procedural details:  LEFT BSCI DC ICD, 05/01/24 Continue all medications uninterrupted including plavix  (take morning of procedure)  Anesthesia/GA* (She is extremely high anxiety, on intermittent chronic benzos and has issues with fentanyl /versed  with sedation in the past, specifically requested alternative to lido/fentanyl /versed )  Overnight obs, requested by patient with her complex heart history

## 2024-05-01 NOTE — Discharge Summary (Incomplete)
 ELECTROPHYSIOLOGY PROCEDURE DISCHARGE SUMMARY    Patient ID: Madison West,  MRN: [id number], DOB/AGE: 08/01/1955 68 y.o.  Admit date: 05/01/2024 Discharge date: 05/02/2024  Primary Care Physician: Teresa Aldona CROME, NP  Primary Cardiologist: [name], MD  Electrophysiologist: Dr. Almetta    Primary Diagnosis:  Chronic Systolic CHF due to ICM  Secondary Diagnosis: CAD  HTN Palpitations  Aortic Stenosis  Breast CA (R) s/p Lumpectomy, XRT  Depression   Allergies  Allergen Reactions   Prednisone Palpitations   Fentanyl  Swelling and Other (See Comments)    Makes pt hyper feels she is climbing the walls.  Last time she had a colonoscopy with Fentanyl  and Versed  she was awake the whole time.   Versed  [Midazolam ] Swelling and Anxiety    Makes pt hyper feels she is climbing the walls.  Last time she had a colonoscopy with Fentanyl  and Versed  she was awake the whole time.   Vicodin [Hydrocodone -Acetaminophen ] Anxiety    Extreme anxiety.  OK to take oxycodone .     Procedures This Admission:  1.  Implantation of a [employer] dual chamber ICD on 05/01/24 by Dr. Almetta.  The patient received a Radiation Protection Practitioner DR [phone] with [employer] Ingevity [phone] right atrial lead and [employer] Reliance [phone] right ventricular lead. Pt did not require a LV or Left bundle Lead. SABRA DFTs were deferred at time of implant There were no post procedure complications 2.  CXR on 05/02/24 demonstrated no pneumothorax status post device implantation.      Brief HPI:  Madison West is a 68 y.o. female with a PMH of HFrEF, HTN, CAD, aortic stenosis was referred to electrophysiology in the outpatient setting  for consideration of ICD implantation.  Past medical history includes above.  The patient has persistent LV dysfunction despite guideline directed therapy.  Risks, benefits, and alternatives to ICD implantation were reviewed with the  patient who wished to proceed.   Hospital Course:  The patient was admitted and underwent implantation of a [employer] dual chamber ICD with details as outlined above. They were monitored on telemetry overnight which demonstrated NSR .  Left chest was without hematoma or ecchymosis.  The device was interrogated and found to be functioning normally.  CXR was obtained and demonstrated no pneumothorax status post device implantation..  Wound care, arm mobility, and restrictions were reviewed with the patient.  She has baseline benzodiazepine and narcotic use > 3 days of oxycodone  prescribed for pain. The patient was examined and considered stable for discharge to home.   The patient's discharge medications include an ACE-I/ARB/ARNI (cozaar ) and beta blocker (bisoprolol ).    Anticoagulation resumption the patient should resume their Clopidogrel  on 05/04/24 AM  Physical Exam: Vitals:   05/01/24 1900 05/01/24 2341 05/02/24 0354 05/02/24 0718  BP: 128/62 (!) 152/83 127/71 126/60  Pulse: 75 76 80 73  Resp: 18 18 19 18   Temp: (!) 97.5 F (36.4 C) 98.5 F (36.9 C) 98.2 F (36.8 C) 98.4 F (36.9 C)  TempSrc: Oral Oral Oral Oral  SpO2: 96% 97% 97% 94%  Weight:   89.8 kg   Height:   5' 8 (1.727 m)     GEN- NAD. A&O x 3.  HEENT: Normocephalic, atraumatic Lungs- CTAB, normal effort.  Heart- RRR. No M/G/R. ICD site clean / dry, small amt of bruising GI- Soft, NT, ND.  Extremities- No clubbing, cyanosis, or edema Skin- Warm and dry, no rash or lesion. ICD site  with small amt bruising  Discharge Medications:  Allergies as of 05/02/2024       Reactions   Prednisone Palpitations   Fentanyl  Swelling, Other (See Comments)   Makes pt hyper feels she is climbing the walls.  Last time she had a colonoscopy with Fentanyl  and Versed  she was awake the whole time.   Versed  [midazolam ] Swelling, Anxiety   Makes pt hyper feels she is climbing the walls.  Last time she had a colonoscopy  with Fentanyl  and Versed  she was awake the whole time.   Vicodin [hydrocodone -acetaminophen ] Anxiety   Extreme anxiety. OK to take oxycodone .        Medication List     PAUSE taking these medications    clopidogrel  75 MG tablet Wait to take this until: May 04, 2024 Morning Commonly known as: PLAVIX  Take 1 tablet (75 mg total) by mouth daily.       TAKE these medications    acetaminophen  325 MG tablet Commonly known as: TYLENOL  Take 2 tablets (650 mg total) by mouth every 6 (six) hours. What changed:  how much to take when to take this reasons to take this   alprazolam  2 MG tablet Commonly known as: XANAX  Take 1-2 mg by mouth 3 (three) times daily as needed for anxiety (Take 1 mg (may take up to 2 mg) twice daily. May also take 0.25 mg during the day if needed for anxiety.).   amoxicillin  500 MG tablet Commonly known as: AMOXIL  Take 4 tablets (2,000 mg total) by mouth as directed. 1 HOUR PRIOR TO DENTAL PROCEDURES, INCLUDING CLEANINGS   Bisoprolol  Fumarate 2.5 MG Tabs Take 2.5 mg by mouth daily.   busPIRone  10 MG tablet Commonly known as: BUSPAR  TAKE 1 TABLET BY MOUTH TWICE A DAY What changed:  how much to take when to take this   dapagliflozin  propanediol 10 MG Tabs tablet Commonly known as: Farxiga  Take 1 tablet (10 mg total) by mouth daily before breakfast.   furosemide  20 MG tablet Commonly known as: Lasix  Take 0.5 tablets (10 mg total) by mouth daily as needed (for lower extremity swelling and weight gain).   losartan  25 MG tablet Commonly known as: COZAAR  Take 1 tablet (25 mg total) by mouth daily.   methocarbamol  500 MG tablet Commonly known as: ROBAXIN  Take 1 tablet (500 mg total) by mouth 2 (two) times daily.   nitroGLYCERIN  0.4 MG SL tablet Commonly known as: NITROSTAT  Place 1 tablet (0.4 mg total) under the tongue every 5 (five) minutes as needed for chest pain.   oxyCODONE  5 MG immediate release tablet Commonly known as: Oxy  IR/ROXICODONE  Take 1-2 tablets (5-10 mg total) by mouth every 6 (six) hours as needed for up to 3 days for severe pain (pain score 7-10). What changed:  how much to take when to take this   rosuvastatin  10 MG tablet Commonly known as: CRESTOR  Take 1 tablet (10 mg total) by mouth daily. What changed: how much to take   spironolactone  25 MG tablet Commonly known as: ALDACTONE  Take 1 tablet (25 mg total) by mouth daily.        Disposition: Home with usual follow up as in AVS  Duration of Discharge Encounter:  APP time: 30 minutes  Signed, Daphne Barrack, NP-C, AGACNP-BC [facility] - Electrophysiology  05/02/2024, 1:54 PM

## 2024-05-01 NOTE — Op Note (Signed)
 Procedure:  LEFT sided BSCI Dual Chamber ICD Implant  Pre-op Diagnosis: HFrEF/ICM, LVEF 35%, NYHA class II    Post-op Diagnosis: same  Procedure Date: 05/01/24  Attending: Adina Primus, MD  Anesthesia: GA   Procedure Report:  The LEFT shoulder was prepped with chlorhexidine  scrub. 40 cc lidocaine  was injected at the planned incision site. A pocket was created with electrocautery and blunt dissection.    No venogram was performed. The LEFT axillary vein was accessed using a standard access needle under ultrasound guidance. A wire was passed into the IVC. The access process was repeated once and a second wire was advanced into the IVC.  An 8 Fr sheath was advanced and the dilator removed. The RV ICD lead was inserted into the sheath and the lead was then advanced into the RVOT. After exchange of pigtail shaped stylet for a curved stylet, the lead was retracted into the RV cavity and advanced to the RV apical septum. Two view flouroscopy then confirmed RV apical, septal position. The helix was extended and the lead fixation was tested with lead tension. Interrogation revealed a current of injury with acceptable lead parameters (see below). The sheath was split and removed and the RV ICD lead was firmly attached to the pre-pectoralis fascia with three 0-silk sutures.   An 8 Fr sheath was placed over the second wire. The RA lead was subsequently advanced over a straight stylet into the inferior RA. A curved stylet was then used to affix the RA lead in the right atrial appendage. The active fixation screw was deployed, and interrogation revealed a current of injury with acceptable lead parameters (see below). The peel away sheath was removed and the lead was affixed to the fascia using two 0-silk sutures.   The device was then connected to the leads. The pocket was irrigated with Irrisept. The device was affixed to the underlying fascia with a single 0-silk suture. The pocket was closed with  continuous 2-0 V-Loc and 4-0 Stratafix suture. Dermabond was applied over the incision.   The device was evaluated by the representative of the cardiac device manufacturer with implant parameters listed below.   Complications: none Estimated blood loss: less than 50 mL  Implanted Hardware: Implant Name Type Inv. Item Serial No. Manufacturer Lot No. LRB No. Used Action  LEAD INGEVITY [id number] - [id number] Lead LEAD INGEVITY [id number] [employer]  N/A 1 Implanted  LEAD RELIANCE 0672 - [id number]  LEAD RELIANCE 0672 [id number] [employer]  N/A 1 Implanted  ICD VIGILANT DR I766 - [id number] Pacemaker ICD VIGILANT DR [phone] [id number] [employer]  N/A 1 Implanted   Device Information: ICD Generator: BSCI Vigilant EL ICD DR D233, SN: [id number], DOI 05/01/24 RA:  BSCI Ingevity Plus, SN: [id number], DOI 05/01/24 RV: BSC[employer] Reliance 4-Front SGL Active, SN: [id number], DOI 05/01/24  Lead Interrogation Data: RA: 4.4 mV / 771ohms / 0.6 V @ 0.4 ms RV: >25 mV / 546 ohms / 0.4 V @ 0.4 ms Shock Impedance: 80 ohms  Summary: 1. Successful implant of a LEFT sided BSCI DC ICD   Recommendations: Routine post-procedure care with bedrest for 3 hours No heparin  (IV or subcutaneous) for 48 hours. No enoxaparin  (IV or subcutaneous) for 7 days.  Resume Plavix  75 mg daily in 72 hours (11/13 AM), last intervention 01/31/23 PA/lateral CXR in AM Wound check in AM Device interrogation in AM NPO at midnight  Madison DELENA Primus, MD [facility]  Cardiac Electrophysiology

## 2024-05-01 NOTE — Anesthesia Procedure Notes (Signed)
 Procedure Name: Intubation Date/Time: 05/01/2024 10:03 AM  Performed by: Vertie Arthea RAMAN, CRNAPre-anesthesia Checklist: Patient identified, Emergency Drugs available, Suction available and Patient being monitored Patient Re-evaluated:Patient Re-evaluated prior to induction Oxygen Delivery Method: Circle System Utilized Preoxygenation: Pre-oxygenation with 100% oxygen Induction Type: IV induction Ventilation: Mask ventilation without difficulty Laryngoscope Size: Miller and 2 Grade View: Grade II Tube type: Oral Tube size: 7.0 mm Number of attempts: 1 Airway Equipment and Method: Stylet and Oral airway Placement Confirmation: ETT inserted through vocal cords under direct vision, positive ETCO2 and breath sounds checked- equal and bilateral Tube secured with: Tape Dental Injury: Teeth and Oropharynx as per pre-operative assessment

## 2024-05-01 NOTE — Progress Notes (Signed)
 I believe in GOD

## 2024-05-01 NOTE — Plan of Care (Signed)
  Problem: Clinical Measurements: Goal: Ability to maintain clinical measurements within normal limits will improve Outcome: Progressing Goal: Cardiovascular complication will be avoided Outcome: Progressing   Problem: Pain Managment: Goal: General experience of comfort will improve and/or be controlled Outcome: Progressing   Problem: Safety: Goal: Ability to remain free from injury will improve Outcome: Progressing

## 2024-05-01 NOTE — TOC CM/SW Note (Signed)
 Transition of Care [facility]) - Inpatient Brief Assessment   Patient Details  Name: Madison West MRN: [id number] Date of Birth: Nov 27, 1955  Transition of [facility]) CM/SW Contact:    Waddell Barnie Rama, RN Phone Number: 05/01/2024, 4:39 PM   Clinical Narrative: From home with daughter, has PCP, [facility] and insurance on file, states has no HH services in place at this time or DME at home.   She still works, she is indep.  States family member (daughter)  will transport them home at [employer] and family is support system, states gets medications from [facility] in [place].  Pta self ambulatory.   There are no ICM  needs identified  at this time.  Please place consult for ICM  needs.       Transition of Care Asessment: Insurance and Status: Insurance coverage has been reviewed Patient has primary care physician: Yes Home environment has been reviewed: daughter lives wiht her Prior level of function:: indep Prior/Current Home Services: No current home services Social Drivers of Health Review: SDOH reviewed no interventions necessary Readmission risk has been reviewed: Yes Transition of care needs: no transition of care needs at this time

## 2024-05-01 NOTE — Progress Notes (Signed)
 Dr. Almetta here speaking with patient and daughter. Handoff provided to Zach, SCIENTIST, CLINICAL (HISTOCOMPATIBILITY AND IMMUNOGENETICS). Daughter Charmaine at bedside -Pt. And Charmaine agree to proceed.

## 2024-05-01 NOTE — Interval H&P Note (Signed)
 History and Physical Interval Note:  05/01/2024 9:15 AM  Madison West  has presented today for surgery, with the diagnosis of cardiomyopathy.  The various methods of treatment have been discussed with the patient and family. After consideration of risks, benefits and other options for treatment, the patient has consented to  Procedure(s): ICD IMPLANT (N/A) as a surgical intervention.  The patient's history has been reviewed, patient examined, no change in status, stable for surgery.  I have reviewed the patient's chart and labs.  Questions were answered to the patient's satisfaction.     Madison West

## 2024-05-01 NOTE — Transfer of Care (Signed)
 Immediate Anesthesia Transfer of Care Note  Patient: Madison West  Procedure(s) Performed: ICD IMPLANT  Patient Location: Cath Lab  Anesthesia Type:General  Level of Consciousness: awake, alert , and oriented  Airway & Oxygen Therapy: Patient Spontanous Breathing and Patient connected to nasal cannula oxygen  Post-op Assessment: Report given to RN and Post -op Vital signs reviewed and stable  Post vital signs: Reviewed and stable  Last Vitals:  Vitals Value Taken Time  BP    Temp    Pulse    Resp    SpO2      Last Pain:  Vitals:   05/01/24 0900  TempSrc: Oral  PainSc: 0-No pain         Complications: There were no known notable events for this encounter.

## 2024-05-01 NOTE — Anesthesia Preprocedure Evaluation (Signed)
 Anesthesia Evaluation  Patient identified by MRN, date of birth, ID band Patient awake    Reviewed: Allergy & Precautions, NPO status , Patient's Chart, lab work & pertinent test results  History of Anesthesia Complications (+) history of anesthetic complications  Airway Mallampati: II  TM Distance: >3 FB Neck ROM: Full    Dental  (+) Dental Advisory Given, Chipped, Missing, Poor Dentition   Pulmonary pneumonia, former smoker   Pulmonary exam normal        Cardiovascular hypertension, Pt. on medications + angina  + CAD, + Past MI, + Cardiac Stents, + Peripheral Vascular Disease, +CHF, + DOE and + DVT  Normal cardiovascular exam+ dysrhythmias Supra Ventricular Tachycardia + Valvular Problems/Murmurs    '25 TTE - EF 35 to 40%. Trivial MR. Well-seated bioprosthetic AV with normal function.     Neuro/Psych  PSYCHIATRIC DISORDERS Anxiety Depression     Neuromuscular disease negative neurological ROS     GI/Hepatic negative GI ROS, Neg liver ROS,,,  Endo/Other   Na 132   Renal/GU negative Renal ROS     Musculoskeletal  (+) Arthritis ,    Abdominal   Peds  Hematology  On plavix     Anesthesia Other Findings   Reproductive/Obstetrics                              Anesthesia Physical Anesthesia Plan  ASA: 4  Anesthesia Plan: General   Post-op Pain Management: Minimal or no pain anticipated   Induction: Intravenous  PONV Risk Score and Plan: 3 and Treatment may vary due to age or medical condition, Ondansetron  and Propofol  infusion  Airway Management Planned: LMA and Oral ETT  Additional Equipment: None  Intra-op Plan:   Post-operative Plan: Extubation in OR  Informed Consent: I have reviewed the patients History and Physical, chart, labs and discussed the procedure including the risks, benefits and alternatives for the proposed anesthesia with the patient or authorized  representative who has indicated his/her understanding and acceptance.     Dental advisory given  Plan Discussed with: CRNA and Anesthesiologist  Anesthesia Plan Comments: (See PAT note written 11/09/2023 by Allison Zelenak, PA-C.  )        Anesthesia Quick Evaluation

## 2024-05-01 NOTE — Progress Notes (Signed)
 Patient requested belongings from family. I had a NT retrieve belongings from family in waiting area, as well as offered patient a turkey sandwhich lunch box and beverage.  Patient requested  update on bed assignment. I called bed placement to get status. Bed placement states pt is next in line for a room assignment. Pt has been re- positioned in her bed and all lunch items have been opened for her convenience.

## 2024-05-02 ENCOUNTER — Ambulatory Visit (HOSPITAL_COMMUNITY)

## 2024-05-02 ENCOUNTER — Encounter: Payer: Self-pay | Admitting: Pulmonary Disease

## 2024-05-02 DIAGNOSIS — Z95810 Presence of automatic (implantable) cardiac defibrillator: Secondary | ICD-10-CM

## 2024-05-02 DIAGNOSIS — I255 Ischemic cardiomyopathy: Secondary | ICD-10-CM | POA: Diagnosis not present

## 2024-05-02 DIAGNOSIS — I5022 Chronic systolic (congestive) heart failure: Secondary | ICD-10-CM | POA: Diagnosis not present

## 2024-05-02 DIAGNOSIS — I2510 Atherosclerotic heart disease of native coronary artery without angina pectoris: Secondary | ICD-10-CM | POA: Diagnosis not present

## 2024-05-02 DIAGNOSIS — I110 Hypertensive heart disease with heart failure: Secondary | ICD-10-CM | POA: Diagnosis not present

## 2024-05-02 MED ORDER — OXYCODONE HCL 5 MG PO TABS: 5.0000 mg | ORAL_TABLET | Freq: Four times a day (QID) | ORAL | 0 refills | Status: AC | PRN

## 2024-05-02 NOTE — Progress Notes (Signed)
 Mobility Specialist Progress Note:    05/02/24 1135  Mobility  Activity Ambulated with assistance  Level of Assistance Independent after set-up  Assistive Device None  Distance Ambulated (ft) 500 ft  Range of Motion/Exercises Active  Activity Response Tolerated fair  Mobility Referral Yes  Mobility visit 1 Mobility  Mobility Specialist Start Time (ACUTE ONLY) 1135  Mobility Specialist Stop Time (ACUTE ONLY) 1214  Mobility Specialist Time Calculation (min) (ACUTE ONLY) 39 min   Received pt standing in room agreeable to session. C/o of chest soreness and feeling tired but otherwise doing ok. Pt moving and ambulating decently. Returned pt to room w/ all needs met.   Venetia Keel Mobility Specialist Please Neurosurgeon or Rehab Office at [phone]

## 2024-05-02 NOTE — TOC Transition Note (Signed)
 Transition of Care [facility]) - Discharge Note   Patient Details  Name: Madison West MRN: [id number] Date of Birth: 06-Oct-1955  [facility]) CM/SW Contact:  Tom-Johnson, Harvest Muskrat, RN Phone Number: 05/02/2024, 2:47 PM   Clinical Narrative:     Patient is scheduled for discharge today.  Outpatient f/u, hospital f/u and discharge instructions on AVS. No ICM needs or recommendations noted. Daughter, Charmaine to transport at discharge.  No further ICM needs noted.       Final next level of care: Home/Self Care Barriers to Discharge: Barriers Resolved   Patient Goals and CMS Choice Patient states their goals for this hospitalization and ongoing recovery are:: To return home CMS Medicare.gov Compare Post Acute Care list provided to:: Patient Choice offered to / list presented to : NA      Discharge Placement                Patient to be transferred to facility by: Daughter Name of family member notified: [facility]    Discharge Plan and Services Additional resources added to the After Visit Summary for                  DME Arranged: N/A DME Agency: NA       HH Arranged: NA HH Agency: NA        Social Drivers of Health (SDOH) Interventions SDOH Screenings   Food Insecurity: No Food Insecurity (05/02/2024)  Housing: Low Risk  (05/02/2024)  Transportation Needs: No Transportation Needs (05/02/2024)  Utilities: Not At Risk (05/02/2024)  Alcohol Screen: Low Risk  (03/01/2024)  Depression (PHQ2-9): Low Risk  (12/01/2023)  Financial Resource Strain: Low Risk  (03/01/2024)  Social Connections: Patient Declined (05/02/2024)  Tobacco Use: Medium Risk (04/03/2024)     Readmission Risk Interventions     No data to display

## 2024-05-02 NOTE — Anesthesia Postprocedure Evaluation (Signed)
 Anesthesia Post Note  Patient: Madison West  Procedure(s) Performed: ICD IMPLANT     Patient location during evaluation: PACU Anesthesia Type: General Level of consciousness: awake and alert Pain management: pain level controlled Vital Signs Assessment: post-procedure vital signs reviewed and stable Respiratory status: spontaneous breathing, nonlabored ventilation, respiratory function stable and patient connected to nasal cannula oxygen Cardiovascular status: blood pressure returned to baseline and stable Postop Assessment: no apparent nausea or vomiting Anesthetic complications: no   There were no known notable events for this encounter.  Last Vitals:  Vitals:   05/02/24 0354 05/02/24 0718  BP: 127/71 126/60  Pulse: 80 73  Resp: 19 18  Temp: 36.8 C 36.9 C  SpO2: 97% 94%    Last Pain:  Vitals:   05/02/24 1013  TempSrc:   PainSc: 7                  Madison West

## 2024-05-05 ENCOUNTER — Telehealth: Payer: Self-pay

## 2024-05-05 NOTE — Telephone Encounter (Signed)
 Madison West paged the on call number due to pain, swelling, and redness around her left BSCI DC implant incision site. Her daughter states she noticed these symptoms yesterday. The pain around the site has since increased in severity, and the redness and swelling has stayed about the same since the procedure. No fever, chills, lightheadedness, or dizziness. No pus draining from the site. I advised her to present to the ED for further evaluation so that we could assess for an infection. The patient states she does not want to present to the ED and would rather be seen in the morning.  I spoke with the patient in more depth to clarify her symptoms. She states she the redness is just around the incisional site, is minor, and has not spread. The swelling has been present since the procedure, and she has been icing the area and has not spread. I again told her I would be unable to assess for an infection of the area without evaluating her, and my recommendation would be for her to be seen tonight. However, she stated that she did not to come to the ED tonight and that she only called as she needed clarification on post-operative care. I told her that signs of a systemic/worsening infection included fever, chills, increasing swelling, increased redness, lightheadedness, and/or dizziness. She agreed to be seen in the morning.

## 2024-05-06 ENCOUNTER — Telehealth: Payer: Self-pay

## 2024-05-06 ENCOUNTER — Telehealth: Payer: Self-pay | Admitting: Student in an Organized Health Care Education/Training Program

## 2024-05-06 DIAGNOSIS — L089 Local infection of the skin and subcutaneous tissue, unspecified: Secondary | ICD-10-CM

## 2024-05-06 MED ORDER — CEPHALEXIN 500 MG PO CAPS
500.0000 mg | ORAL_CAPSULE | Freq: Three times a day (TID) | ORAL | 0 refills | Status: AC
Start: 2024-05-06 — End: 2024-05-13

## 2024-05-06 NOTE — Telephone Encounter (Signed)
 Looked at pictures her daughter sent of device site. Looks similar to post implant. With mild erythema/warmth will tx as potential SSTI with keflex  500 mg tid x 7 days. Prescription sent to [facility].

## 2024-05-06 NOTE — Telephone Encounter (Signed)
 Spoke to patient about sx. Redness at incision site seems to be improving now. Some warmth at site but knot has gone down in size. Not sure if it is reactive, local site infection or hematoma. Asked her to send me a picture of it to see if we need to try short course of abx if superficial infection vs admission if possible device infxn. She will send me today once her daughter gets up. For now no systemic sx of infxn so no acute need for admission/evaluation. Will see her Monday when I am back in town if sx do not improve or worsen.

## 2024-05-07 NOTE — Telephone Encounter (Signed)
 duplicate

## 2024-05-08 ENCOUNTER — Telehealth: Payer: Self-pay | Admitting: Student in an Organized Health Care Education/Training Program

## 2024-05-08 NOTE — Telephone Encounter (Signed)
 Patient is requesting a return to work letter to be e-mailed to her at the e-mail below. She says she is scheduled to return to work tomorrow. She would like a call back to confirm if possible.  [email or web address]

## 2024-05-09 MED ORDER — NITROGLYCERIN 0.4 MG SL SUBL: 0.4000 mg | SUBLINGUAL_TABLET | SUBLINGUAL | 4 refills | Status: AC | PRN

## 2024-05-09 NOTE — Telephone Encounter (Signed)
 Once I have I am happy to sign it. Thank you.

## 2024-05-09 NOTE — Telephone Encounter (Signed)
 Spoke with pt and advised letter was written by Daphne Barrack on 05/02/2024 and sent through MyChart.  Pt requested a hard copy be printed.  Letter placed at [employer] desk for pick up.  Pt verbalizes understanding and agrees with current plan.

## 2024-05-09 NOTE — Telephone Encounter (Signed)
 Patient is requesting a return to work letter to be e-mailed to her at the e-mail below. She says she is scheduled to return to work tomorrow. She would like a call back to confirm if possible.   [email or web address]   I spoke with the patient and she stated she just needs her provider to type up that she can go back to work and specify her weight restrictions. I will send this over to Dr. Almetta and his nurse.

## 2024-05-11 ENCOUNTER — Ambulatory Visit: Attending: Cardiovascular Disease

## 2024-05-11 DIAGNOSIS — I255 Ischemic cardiomyopathy: Secondary | ICD-10-CM | POA: Insufficient documentation

## 2024-05-11 DIAGNOSIS — I5020 Unspecified systolic (congestive) heart failure: Secondary | ICD-10-CM | POA: Diagnosis present

## 2024-05-11 LAB — CUP PACEART INCLINIC DEVICE CHECK
Date Time Interrogation Session: 20251120120100
HighPow Impedance: 73 Ohm
Implantable Lead Connection Status: 753985
Implantable Lead Connection Status: 753985
Implantable Lead Implant Date: 20251110
Implantable Lead Implant Date: 20251110
Implantable Lead Location: 753859
Implantable Lead Location: 753860
Implantable Lead Model: 672
Implantable Lead Model: 7841
Implantable Lead Serial Number: 1671795
Implantable Lead Serial Number: 296494
Implantable Pulse Generator Implant Date: 20251110
Lead Channel Impedance Value: 508 Ohm
Lead Channel Impedance Value: 558 Ohm
Lead Channel Pacing Threshold Amplitude: 0.5 V
Lead Channel Pacing Threshold Amplitude: 0.7 V
Lead Channel Pacing Threshold Pulse Width: 0.4 ms
Lead Channel Pacing Threshold Pulse Width: 0.4 ms
Lead Channel Sensing Intrinsic Amplitude: 25 mV
Lead Channel Sensing Intrinsic Amplitude: 6.3 mV
Lead Channel Setting Pacing Amplitude: 3.5 V
Lead Channel Setting Pacing Amplitude: 3.5 V
Lead Channel Setting Pacing Pulse Width: 0.4 ms
Lead Channel Setting Sensing Sensitivity: 0.5 mV
Pulse Gen Serial Number: 711607

## 2024-05-11 NOTE — Progress Notes (Signed)
 Normal dual chamber ICD wound check. Wound well healed. Presenting rhythm: AS/VS 85. Routine testing performed. Thresholds, sensing, and impedance consistent with implant measurements with 3.5V safety margin/auto capture until 3 month visit. No treated arrhythmias. Reviewed arm restrictions to continue for 6 weeks total post op. Reviewed shock plan.  Pt enrolled in remote follow-up.

## 2024-05-11 NOTE — Patient Instructions (Signed)
  After Your ICD (Implantable Cardiac Defibrillator)    Monitor your defibrillator site for redness, swelling, and drainage. Call the device clinic at [phone] if you experience these symptoms or fever/chills.  Your incision was closed with Dermabond:  You may shower 1 day after your defibrillator implant and wash your incision with soap and water. Avoid lotions, ointments, or perfumes over your incision until it is well-healed.  You may use a hot tub or a pool after your wound check appointment if the incision is completely closed.  Do not lift, push or pull greater than 10 pounds with the affected arm until DECEMBER 22nd. There are no other restrictions in arm movement after your wound check appointment.  Your ICD is designed to protect you from life threatening heart rhythms. Because of this, you may receive a shock.   1 shock with no symptoms:  Call the office during business hours. 1 shock with symptoms (chest pain, chest pressure, dizziness, lightheadedness, shortness of breath, overall feeling unwell):  Call 911. If you experience 2 or more shocks in 24 hours:  Call 911. If you receive a shock, you should not drive.  [place] DMV - no driving for 6 months if you receive appropriate therapy from your ICD.   ICD Alerts:  Some alerts are vibratory and others beep. These are NOT emergencies. Please call our office to let us  know. If this occurs at night or on weekends, it can wait until the next business day. Send a remote transmission.  If your device is capable of reading fluid status (for heart failure), you will be offered monthly monitoring to review this with you.   Remote monitoring is used to monitor your ICD from home. This monitoring is scheduled every 91 days by our office. It allows us  to keep an eye on the functioning of your device to ensure it is working properly. You will routinely see your Electrophysiologist annually (more often if necessary).

## 2024-05-14 ENCOUNTER — Telehealth: Payer: Self-pay

## 2024-05-14 NOTE — Telephone Encounter (Signed)
 Madison West paged the on call number due to pain, swelling, and redness around her left BSCI DC-PM implant incision site inserted 05/01/2024 with Dr Almetta.  She reached out to our team on 11/14 with a similar concern and was ultimately treated with 7-day course of Keflex  by Dr. Almetta.  She has since gone to work and has noticed some swelling since this past Thursday that sounds like it is about the same versus slightly worse.  Based on the patient's description it responds well to ice packs.  She denies fever, chills, lightheadedness, and dizziness.  There is no pus or blood draining from the site.  The patient and I had a long discussion about various options and I recommended she present to our ED so I can examine the incision site and swelling.  The patient was concerned about having to wait for a long period of time in the ED to be roomed and has decided to stay at home and monitor for now and reach out in the morning again.  The patient will call us  tonight if she has any more questions, concerns, or if the swelling gets worse.

## 2024-05-15 ENCOUNTER — Ambulatory Visit: Attending: Cardiology

## 2024-05-15 ENCOUNTER — Telehealth: Payer: Self-pay

## 2024-05-15 DIAGNOSIS — I5021 Acute systolic (congestive) heart failure: Secondary | ICD-10-CM

## 2024-05-15 NOTE — Telephone Encounter (Signed)
 Appt at 1130 this am in device clinic for wound eval.

## 2024-05-15 NOTE — Telephone Encounter (Signed)
 Spoke with pt she states her site has pocket filled fluid and is concerned. Pt called over the weekend and MCA stated that she can come in today and get it looked at

## 2024-05-15 NOTE — Progress Notes (Signed)
 Patient seen in device clinic for device wound check. Minimal if any swelling noted to or around device site/left chest area. No redness, swelling or drainage noted. Dr. Nancey in to assess and also advised patient site appears fine and no concern at this time. Secure chat sent to Dr. Almetta about patients concerns and that she still would like to see him in office.

## 2024-05-15 NOTE — Patient Instructions (Signed)
   After Your ICD (Implantable Cardiac Defibrillator)    Monitor your defibrillator site for redness, swelling, and drainage. Call the device clinic at [phone] if you experience these symptoms or fever/chills.   

## 2024-05-30 ENCOUNTER — Ambulatory Visit: Payer: Self-pay | Admitting: Student in an Organized Health Care Education/Training Program

## 2024-06-07 ENCOUNTER — Encounter: Payer: Self-pay | Admitting: Cardiology

## 2024-06-07 ENCOUNTER — Ambulatory Visit: Admitting: Cardiology

## 2024-06-07 VITALS — BP 136/84 | HR 83 | Ht 68.0 in | Wt 195.6 lb

## 2024-06-07 DIAGNOSIS — I2510 Atherosclerotic heart disease of native coronary artery without angina pectoris: Secondary | ICD-10-CM | POA: Insufficient documentation

## 2024-06-07 DIAGNOSIS — I5020 Unspecified systolic (congestive) heart failure: Secondary | ICD-10-CM | POA: Insufficient documentation

## 2024-06-07 NOTE — Patient Instructions (Signed)
 Medication Instructions:  None *If you need a refill on your cardiac medications before your next appointment, please call your pharmacy*  Lab Work: None If you have labs (blood work) drawn today and your tests are completely normal, you will receive your results only by: MyChart Message (if you have MyChart) OR A paper copy in the mail If you have any lab test that is abnormal or we need to change your treatment, we will call you to review the results.  Testing/Procedures: Your physician has requested that you have an echocardiogram. Echocardiography is a painless test that uses sound waves to create images of your heart. It provides your doctor with information about the size and shape of your heart and how well your hearts chambers and valves are working. This procedure takes approximately one hour. There are no restrictions for this procedure. Please do NOT wear cologne, perfume, aftershave, or lotions (deodorant is allowed). Please arrive 15 minutes prior to your appointment time.  Please note: We ask at that you not bring children with you during ultrasound (echo/ vascular) testing. Due to room size and safety concerns, children are not allowed in the ultrasound rooms during exams. Our front office staff cannot provide observation of children in our lobby area while testing is being conducted. An adult accompanying a patient to their appointment will only be allowed in the ultrasound room at the discretion of the ultrasound technician under special circumstances. We apologize for any inconvenience.   Follow-Up: At [facility], you and your health needs are our priority.  As part of our continuing mission to provide you with exceptional heart care, our providers are all part of one team.  This team includes your primary Cardiologist (physician) and Advanced Practice Providers or APPs (Physician Assistants and Nurse Practitioners) who all work together to provide you with the care  you need, when you need it.  Your next appointment:   4 month(s)  Provider:   [name], MD

## 2024-06-07 NOTE — Progress Notes (Signed)
 Cardiology Office Note:  .   Date:  06/07/2024  ID:  Madison West, DOB 02/05/1956, MRN [id number] PCP: Madison Aldona CROME, NP  Madison West Cardiologist:  Madison Lawrence, MD PCP: Madison Aldona CROME, NP  Chief Complaint  Patient presents with   Coronary Artery Disease      History of Present Illness: .    Madison West is a 68 y.o. female with hyperlipidemia, CAD, HFrEF, h/o DVT, carotid artery disease s/p Rt CEA, h/o breast cancer  Patient had prolonged hospital stay in 01/2023 with VF arrest due to acute stent thrombosis, underwent TAVR for severe AS , developed provoked DVT in Lt internal jugular along with acute superficial vein thrombosis involving the left cephalic vein, proximal upper arm to mid forearm (01/2023).  Patient has completed anticoagulation since then, and is currently on aspirin  and Plavix .  Echocardiogram in 01/2024 showed EF 35%.  Patient reports exertional dyspnea with more than usual activity, but denies any orthopnea, PND, leg edema symptoms.  She recently underwent bronchoscopy and biopsy with Dr. Theophilus for hemoptysis, that showed no malignancy.  Hemoptysis was thought to be related to aspirin  and Plavix  use.  CT scan showed slight lung inflammation, possibly due to hypersensitive pneumonitis or congestive heart failure.  She was recommended follow-up CT scan if hemoptysis persists or worsens.  Since her last visit with me, patient underwent ICD implantation by Dr. Almetta in 02/2024. She still feels that it has been a setback that she needed ICD< although she denies chest pain, shortness of breath, palpitations, leg edema, orthopnea, PND, TIA/syncope.   Vitals:   06/07/24 1149  BP: 136/84  Pulse: 83  SpO2: 94%         ROS:  Review of Systems  Cardiovascular:  Negative for chest pain, dyspnea on exertion, leg swelling, palpitations and syncope.     Studies Reviewed: .        Device check 04/2024: In-clinic  interrogation of BSCI DC ICD reviewed. Presenting Rhythm: ASVS. Battery and lead parameters stable with stable capture and sensing. No treated arrhythmias. Routine testing was performed and device programming remains appropriate. Continue remote monitoring.   Echocardiogram 01/2024:  1. Left ventricular ejection fraction, by estimation, is 35%. The left  ventricle has normal function. The left ventricle demonstrates regional  wall motion abnormalities (Septal /apical akinesis no mural thrombus. mid/distal anterior and inferior walls hypokinetic Mostly basal function preserved.  The left ventricular internal cavity  size was moderately to severely dilated. There is moderate left ventricular hypertrophy. Left ventricular diastolic parameters are indeterminate.   2. Right ventricular systolic function is normal. The right ventricular  size is normal.   3. Left atrial size was moderately dilated.   4. The mitral valve is abnormal. Trivial mitral valve regurgitation. No  evidence of mitral stenosis. Moderate mitral annular calcification.   5. 26 mm Sapien TAVR valve no PVL mean gradient 6 peak 10.3 mmHg AVA 2.2  cm2. The aortic valve has been repaired/replaced. There is mild  calcification of the aortic valve. There is mild thickening of the aortic  valve. Aortic valve regurgitation is not  visualized. Aortic valve sclerosis is present, with no evidence of aortic  valve stenosis. There is a 26 mm Edwards Sapien prosthetic (TAVR) valve  present in the aortic position. Procedure Date: 02/04/2023.   6. The inferior vena cava is normal in size with greater than 50%  respiratory variability, suggesting right atrial pressure of 3 mmHg.   Labs 03/2024:  Hb 12.9 Cr 0.79  Labs 12/2023: Chol 159, TG 128, HDL 54, LDL 82 Cr 0.8 TSH 2.5  Labs 10/2023: Chol 143, TG 99, HDL 54, LDL 71 Hb 12.2 Cr 9.12 TSH 6.3  08/2023: ProBNP 866 12/2022: Chol 220, TG 126, HDL 51, LDL 146  Physical Exam:   Physical  Exam Vitals and nursing note reviewed.  Constitutional:      General: She is not in acute distress. Neck:     Vascular: No JVD.  Cardiovascular:     Rate and Rhythm: Normal rate and regular rhythm.     Heart sounds: Normal heart sounds. No murmur heard. Pulmonary:     Effort: Pulmonary effort is normal.     Breath sounds: Normal breath sounds. No wheezing or rales.  Musculoskeletal:     Right lower leg: No edema.     Left lower leg: No edema.      VISIT DIAGNOSES:   ICD-10-CM   1. Coronary artery disease involving native coronary artery of native heart without angina pectoris  I25.10 ECHOCARDIOGRAM COMPLETE    2. HFrEF (heart failure with reduced ejection fraction) (HCC)  I50.20           ASSESSMENT AND PLAN: .    [name] is a 68 y.o. female with  hyperlipidemia, CAD, HFrEF, h/o DVT, carotid artery disease s/p Rt CEA, h/o breast cancer   CAD: PCI to LAD, complicated by stent thrombosis while on Plavix  in the setting of severe AS and HFrEF, leading to VF arrest, treated with thrombectomy and balloon dilatation with excellent results (01/2023). Plavix  was chosen as P2 Y12 inhibitor of choice given concurrent use of anticoagulation given diagnosis DVT then. No anginal symptoms at this time. Completed 1 year DAPT with aspirin  and Plavix . Discontinue aspirin , continue Plavix  monotherapy. Currently on Crestor  10 mg daily, has been reluctant to uptitrate lipid lowering therapy.  HFrEF: EF 35% with regional wall motion abnormalities likely related to prior MI.   Clinically, fairly well compensated. She has been extremely intolerant to several GDMT medications, currently tolerating losartan  25 mg daily, spironolactone  25 mg daily, Farxiga  10 mg daily, bisoprolol  2.5 mg daily. She remains reluctant about any further uptitration of GDMT. Now s/p ICD, no arrhythmias noted. Repeat echocardiogram in 07/2024, then follow up.  Hemoptysis: No recent recurrence.  Continue  follow-up with pulmonology.  H/o VF arrest: Occurred in the setting of acute stent thrombosis in 01/2023. Now s/p ICD.   F/u in 3 months  Signed, Madison JINNY Lawrence, MD

## 2024-06-12 ENCOUNTER — Ambulatory Visit: Attending: Family Medicine

## 2024-06-12 DIAGNOSIS — I2510 Atherosclerotic heart disease of native coronary artery without angina pectoris: Secondary | ICD-10-CM | POA: Diagnosis not present

## 2024-06-20 ENCOUNTER — Telehealth: Payer: Self-pay | Admitting: Cardiology

## 2024-06-20 MED ORDER — BISOPROLOL FUMARATE 2.5 MG PO TABS: 2.5000 mg | ORAL_TABLET | Freq: Every day | ORAL | 3 refills | Status: AC

## 2024-06-20 NOTE — Telephone Encounter (Signed)
" °*  STAT* If patient is at the pharmacy, call can be transferred to refill team.   1. Which medications need to be refilled? (please list name of each medication and dose if known)   Bisoprolol  Fumarate 2.5 MG TABS   2. Would you like to learn more about the convenience, safety, & potential cost savings by using the [facility]?   3. Are you open to using the [facility] (Type [facility]. ).  4. Which pharmacy/location (including street and city if local pharmacy) is medication to be sent to?  [id number] - [place],  - [address] AT [facility] [address]   5. Do they need a 30 day or 90 day supply?   30 day  Patient stated she is completely out of this medication.    "

## 2024-06-28 NOTE — Telephone Encounter (Signed)
 Pt called in stating that her monitor is not working and she will call tech support and see what they say. Pt states she will call back with an update

## 2024-07-05 NOTE — Telephone Encounter (Signed)
 Spoke with pt she has received a cell adapter piece that [employer] has sent her. She has not hooked it up yet because she has to work, she will hook it up later in the day and call when she does so

## 2024-07-07 ENCOUNTER — Telehealth: Payer: Self-pay

## 2024-07-07 NOTE — Telephone Encounter (Signed)
 Still no connection, called pt lvm for her to call [facility] direct number back

## 2024-07-07 NOTE — Telephone Encounter (Signed)
 Patient would like call back helping pair her Latitude monitor.

## 2024-07-10 NOTE — Telephone Encounter (Signed)
 Still unable to reach pt sent mychart msg

## 2024-07-12 NOTE — Telephone Encounter (Signed)
 Pt has spoken with Joey([employer] rep) and she is going to go home and try what he told her to do and call back if it doesn't work

## 2024-07-12 NOTE — Telephone Encounter (Signed)
 Pt called in lvm stating that she cannot get a hold of [employer] and they have sent her a wrong piece. I have reached out to Joey([employer] rep) he will reach out and help pt to ensure they dont send her another wrong piece

## 2024-07-14 ENCOUNTER — Ambulatory Visit: Attending: Family Medicine

## 2024-07-14 NOTE — Telephone Encounter (Signed)
 Have set pt up with a in clinic visit with me and Joey to figure what is going on with pt monitor

## 2024-07-17 LAB — CUP PACEART REMOTE DEVICE CHECK
Battery Remaining Longevity: 156 mo
Battery Remaining Percentage: 100 %
Brady Statistic RA Percent Paced: 0 %
Brady Statistic RV Percent Paced: 0 %
Date Time Interrogation Session: 20251222015000
HighPow Impedance: 73 Ohm
Implantable Lead Connection Status: 753985
Implantable Lead Connection Status: 753985
Implantable Lead Implant Date: 20251110
Implantable Lead Implant Date: 20251110
Implantable Lead Location: 753859
Implantable Lead Location: 753860
Implantable Lead Model: 672
Implantable Lead Model: 7841
Implantable Lead Serial Number: 1671795
Implantable Lead Serial Number: 296494
Implantable Pulse Generator Implant Date: 20251110
Lead Channel Impedance Value: 483 Ohm
Lead Channel Impedance Value: 588 Ohm
Lead Channel Pacing Threshold Amplitude: 0.4 V
Lead Channel Pacing Threshold Amplitude: 0.6 V
Lead Channel Pacing Threshold Pulse Width: 0.4 ms
Lead Channel Pacing Threshold Pulse Width: 0.4 ms
Lead Channel Setting Pacing Amplitude: 3.5 V
Lead Channel Setting Pacing Amplitude: 3.5 V
Lead Channel Setting Pacing Pulse Width: 0.4 ms
Lead Channel Setting Sensing Sensitivity: 0.5 mV
Pulse Gen Serial Number: 711607

## 2024-07-17 NOTE — Progress Notes (Signed)
 Remote ICD Transmission

## 2024-07-22 ENCOUNTER — Ambulatory Visit: Payer: Self-pay | Admitting: Student in an Organized Health Care Education/Training Program

## 2024-07-31 ENCOUNTER — Ambulatory Visit: Admitting: Pulmonary Disease

## 2024-08-07 ENCOUNTER — Ambulatory Visit (HOSPITAL_COMMUNITY)

## 2024-09-11 ENCOUNTER — Ambulatory Visit

## 2024-12-11 ENCOUNTER — Ambulatory Visit

## 2025-03-12 ENCOUNTER — Ambulatory Visit

## 2025-06-11 ENCOUNTER — Ambulatory Visit
